# Patient Record
Sex: Female | Born: 1945 | Race: White | Hispanic: No | Smoking: Former smoker
Health system: Southern US, Community
[De-identification: ages and names within clinical notes are randomized; demographics above are authoritative.]

## PROBLEM LIST (undated history)

## (undated) DIAGNOSIS — Z720 Tobacco use: Secondary | ICD-10-CM

## (undated) DIAGNOSIS — F419 Anxiety disorder, unspecified: Secondary | ICD-10-CM

## (undated) DIAGNOSIS — K5792 Diverticulitis of intestine, part unspecified, without perforation or abscess without bleeding: Secondary | ICD-10-CM

## (undated) DIAGNOSIS — I714 Abdominal aortic aneurysm, without rupture, unspecified: Secondary | ICD-10-CM

## (undated) DIAGNOSIS — N135 Crossing vessel and stricture of ureter without hydronephrosis: Secondary | ICD-10-CM

## (undated) DIAGNOSIS — I499 Cardiac arrhythmia, unspecified: Secondary | ICD-10-CM

## (undated) DIAGNOSIS — M1990 Unspecified osteoarthritis, unspecified site: Secondary | ICD-10-CM

## (undated) DIAGNOSIS — K5732 Diverticulitis of large intestine without perforation or abscess without bleeding: Secondary | ICD-10-CM

## (undated) DIAGNOSIS — D1800 Hemangioma unspecified site: Secondary | ICD-10-CM

## (undated) DIAGNOSIS — E785 Hyperlipidemia, unspecified: Secondary | ICD-10-CM

## (undated) DIAGNOSIS — I2510 Atherosclerotic heart disease of native coronary artery without angina pectoris: Secondary | ICD-10-CM

## (undated) DIAGNOSIS — Z972 Presence of dental prosthetic device (complete) (partial): Secondary | ICD-10-CM

## (undated) DIAGNOSIS — I10 Essential (primary) hypertension: Secondary | ICD-10-CM

## (undated) DIAGNOSIS — M21969 Unspecified acquired deformity of unspecified lower leg: Secondary | ICD-10-CM

## (undated) DIAGNOSIS — I4891 Unspecified atrial fibrillation: Secondary | ICD-10-CM

## (undated) DIAGNOSIS — C801 Malignant (primary) neoplasm, unspecified: Secondary | ICD-10-CM

## (undated) DIAGNOSIS — M810 Age-related osteoporosis without current pathological fracture: Secondary | ICD-10-CM

## (undated) HISTORY — DX: Unspecified osteoarthritis, unspecified site: M19.90

## (undated) HISTORY — DX: Hyperlipidemia, unspecified: E78.5

## (undated) HISTORY — PX: OTHER SURGICAL HISTORY: SHX169

## (undated) HISTORY — DX: Atherosclerotic heart disease of native coronary artery without angina pectoris: I25.10

## (undated) HISTORY — PX: KNEE ARTHROSCOPY: SUR90

## (undated) HISTORY — DX: Malignant (primary) neoplasm, unspecified: C80.1

## (undated) HISTORY — PX: TUBAL LIGATION: SHX77

## (undated) HISTORY — DX: Unspecified acquired deformity of unspecified lower leg: M21.969

## (undated) HISTORY — DX: Tobacco use: Z72.0

## (undated) HISTORY — DX: Essential (primary) hypertension: I10

## (undated) HISTORY — DX: Diverticulitis of large intestine without perforation or abscess without bleeding: K57.32

## (undated) HISTORY — DX: Age-related osteoporosis without current pathological fracture: M81.0

## (undated) HISTORY — PX: TONSILLECTOMY AND ADENOIDECTOMY: SUR1326

## (undated) HISTORY — DX: Unspecified atrial fibrillation: I48.91

## (undated) HISTORY — DX: Diverticulitis of intestine, part unspecified, without perforation or abscess without bleeding: K57.92

## (undated) HISTORY — DX: Anxiety disorder, unspecified: F41.9

## (undated) HISTORY — PX: BREAST SURGERY: SHX581

---
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
## 1993-12-11 ENCOUNTER — Encounter: Payer: Self-pay | Admitting: Internal Medicine

## 2002-05-07 ENCOUNTER — Emergency Department (HOSPITAL_COMMUNITY): Admission: EM | Admit: 2002-05-07 | Discharge: 2002-05-07 | Payer: Self-pay | Admitting: Emergency Medicine

## 2005-06-07 ENCOUNTER — Ambulatory Visit: Payer: Self-pay | Admitting: Internal Medicine

## 2005-06-15 ENCOUNTER — Ambulatory Visit: Payer: Self-pay

## 2005-07-05 ENCOUNTER — Ambulatory Visit: Payer: Self-pay | Admitting: Internal Medicine

## 2006-08-20 ENCOUNTER — Ambulatory Visit: Payer: Self-pay | Admitting: Internal Medicine

## 2006-08-24 ENCOUNTER — Emergency Department (HOSPITAL_COMMUNITY): Admission: EM | Admit: 2006-08-24 | Discharge: 2006-08-24 | Payer: Self-pay | Admitting: Emergency Medicine

## 2006-08-27 ENCOUNTER — Ambulatory Visit: Payer: Self-pay | Admitting: Internal Medicine

## 2006-09-16 ENCOUNTER — Ambulatory Visit: Payer: Self-pay | Admitting: Internal Medicine

## 2006-09-17 ENCOUNTER — Encounter: Admission: RE | Admit: 2006-09-17 | Discharge: 2006-09-17 | Payer: Self-pay | Admitting: Internal Medicine

## 2006-09-18 ENCOUNTER — Encounter (HOSPITAL_BASED_OUTPATIENT_CLINIC_OR_DEPARTMENT_OTHER): Admission: RE | Admit: 2006-09-18 | Discharge: 2006-12-17 | Payer: Self-pay | Admitting: Surgery

## 2006-10-07 ENCOUNTER — Ambulatory Visit: Payer: Self-pay | Admitting: Internal Medicine

## 2006-12-17 ENCOUNTER — Ambulatory Visit: Payer: Self-pay | Admitting: Internal Medicine

## 2007-02-15 ENCOUNTER — Emergency Department (HOSPITAL_COMMUNITY): Admission: EM | Admit: 2007-02-15 | Discharge: 2007-02-15 | Payer: Self-pay | Admitting: Emergency Medicine

## 2007-02-20 ENCOUNTER — Ambulatory Visit: Payer: Self-pay | Admitting: Internal Medicine

## 2007-07-22 ENCOUNTER — Encounter: Payer: Self-pay | Admitting: Internal Medicine

## 2007-07-22 DIAGNOSIS — I10 Essential (primary) hypertension: Secondary | ICD-10-CM

## 2007-07-22 DIAGNOSIS — E785 Hyperlipidemia, unspecified: Secondary | ICD-10-CM

## 2008-04-05 ENCOUNTER — Ambulatory Visit: Payer: Self-pay | Admitting: Internal Medicine

## 2008-04-05 LAB — CONVERTED CEMR LAB
ALT: 18 units/L (ref 0–35)
AST: 17 units/L (ref 0–37)
Albumin: 3.6 g/dL (ref 3.5–5.2)
Alkaline Phosphatase: 98 units/L (ref 39–117)
BUN: 15 mg/dL (ref 6–23)
Basophils Absolute: 0 10*3/uL (ref 0.0–0.1)
Basophils Relative: 0 % (ref 0.0–1.0)
Bilirubin Urine: NEGATIVE
Bilirubin, Direct: 0.1 mg/dL (ref 0.0–0.3)
CO2: 26 meq/L (ref 19–32)
Calcium: 9.2 mg/dL (ref 8.4–10.5)
Chloride: 109 meq/L (ref 96–112)
Cholesterol: 206 mg/dL (ref 0–200)
Creatinine, Ser: 0.7 mg/dL (ref 0.4–1.2)
Direct LDL: 158.6 mg/dL
Eosinophils Absolute: 0.1 10*3/uL (ref 0.0–0.7)
Eosinophils Relative: 1.9 % (ref 0.0–5.0)
GFR calc Af Amer: 108 mL/min
GFR calc non Af Amer: 90 mL/min
Glucose, Bld: 142 mg/dL — ABNORMAL HIGH (ref 70–99)
HCT: 42.8 % (ref 36.0–46.0)
HDL: 35.3 mg/dL — ABNORMAL LOW (ref >39.0)
Hemoglobin: 14.5 g/dL (ref 12.0–15.0)
Ketones, ur: NEGATIVE mg/dL
Lymphocytes Relative: 31.5 % (ref 12.0–46.0)
MCHC: 34 g/dL (ref 30.0–36.0)
MCV: 89.9 fL (ref 78.0–100.0)
Monocytes Absolute: 0.3 10*3/uL (ref 0.1–1.0)
Monocytes Relative: 5.8 % (ref 3.0–12.0)
Neutro Abs: 3.7 10*3/uL (ref 1.4–7.7)
Neutrophils Relative %: 60.8 % (ref 43.0–77.0)
Nitrite: POSITIVE — AB
Platelets: 226 10*3/uL (ref 150–400)
Potassium: 4.5 meq/L (ref 3.5–5.1)
RBC: 4.76 M/uL (ref 3.87–5.11)
RDW: 13.4 % (ref 11.5–14.6)
Sodium: 142 meq/L (ref 135–145)
Specific Gravity, Urine: 1.015 (ref 1.000–1.03)
TSH: 2.58 microintl units/mL (ref 0.35–5.50)
Total Bilirubin: 0.6 mg/dL (ref 0.3–1.2)
Total CHOL/HDL Ratio: 5.8
Total Protein, Urine: NEGATIVE mg/dL
Total Protein: 7.1 g/dL (ref 6.0–8.3)
Triglycerides: 73 mg/dL (ref 0–149)
Urine Glucose: NEGATIVE mg/dL
Urobilinogen, UA: 0.2 (ref 0.0–1.0)
VLDL: 15 mg/dL (ref 0–40)
WBC: 6 10*3/uL (ref 4.5–10.5)
pH: 5.5 (ref 5.0–8.0)

## 2008-04-07 ENCOUNTER — Telehealth: Payer: Self-pay | Admitting: Internal Medicine

## 2008-04-09 ENCOUNTER — Ambulatory Visit: Payer: Self-pay | Admitting: Internal Medicine

## 2008-04-09 DIAGNOSIS — F17200 Nicotine dependence, unspecified, uncomplicated: Secondary | ICD-10-CM

## 2008-04-19 ENCOUNTER — Ambulatory Visit: Payer: Self-pay | Admitting: Internal Medicine

## 2008-04-19 LAB — CONVERTED CEMR LAB: OCCULT 4: NEGATIVE

## 2008-04-20 ENCOUNTER — Encounter: Payer: Self-pay | Admitting: Internal Medicine

## 2008-04-21 ENCOUNTER — Ambulatory Visit (HOSPITAL_COMMUNITY): Admission: RE | Admit: 2008-04-21 | Discharge: 2008-04-21 | Payer: Self-pay | Admitting: Internal Medicine

## 2008-06-04 ENCOUNTER — Ambulatory Visit: Payer: Self-pay | Admitting: Internal Medicine

## 2008-06-04 LAB — CONVERTED CEMR LAB
BUN: 12 mg/dL (ref 6–23)
Chloride: 107 meq/L (ref 96–112)
Cholesterol: 176 mg/dL (ref 0–200)
Creatinine, Ser: 0.7 mg/dL (ref 0.4–1.2)
GFR calc non Af Amer: 90 mL/min
LDL Cholesterol: 123 mg/dL — ABNORMAL HIGH (ref 0–99)
Triglycerides: 87 mg/dL (ref 0–149)
VLDL: 17 mg/dL (ref 0–40)

## 2008-06-07 ENCOUNTER — Ambulatory Visit: Payer: Self-pay | Admitting: Internal Medicine

## 2008-06-07 DIAGNOSIS — E118 Type 2 diabetes mellitus with unspecified complications: Secondary | ICD-10-CM | POA: Insufficient documentation

## 2008-09-01 ENCOUNTER — Telehealth: Payer: Self-pay | Admitting: Internal Medicine

## 2008-12-17 ENCOUNTER — Ambulatory Visit (HOSPITAL_BASED_OUTPATIENT_CLINIC_OR_DEPARTMENT_OTHER): Admission: RE | Admit: 2008-12-17 | Discharge: 2008-12-17 | Payer: Self-pay | Admitting: Urology

## 2008-12-17 ENCOUNTER — Encounter (INDEPENDENT_AMBULATORY_CARE_PROVIDER_SITE_OTHER): Payer: Self-pay | Admitting: Urology

## 2009-01-17 ENCOUNTER — Ambulatory Visit (HOSPITAL_COMMUNITY): Admission: RE | Admit: 2009-01-17 | Discharge: 2009-01-17 | Payer: Self-pay | Admitting: Urology

## 2009-12-10 HISTORY — PX: OTHER SURGICAL HISTORY: SHX169

## 2010-12-10 HISTORY — PX: CARDIAC CATHETERIZATION: SHX172

## 2010-12-10 HISTORY — PX: CORONARY STENT PLACEMENT: SHX1402

## 2011-02-13 ENCOUNTER — Emergency Department (HOSPITAL_COMMUNITY): Payer: Medicare Other

## 2011-02-13 ENCOUNTER — Inpatient Hospital Stay (HOSPITAL_COMMUNITY)
Admission: EM | Admit: 2011-02-13 | Discharge: 2011-02-16 | DRG: 247 | Disposition: A | Discharge status: Home / Self Care | Payer: Medicare Other | Attending: Cardiovascular Disease | Admitting: Cardiovascular Disease

## 2011-02-13 DIAGNOSIS — I10 Essential (primary) hypertension: Secondary | ICD-10-CM | POA: Diagnosis present

## 2011-02-13 DIAGNOSIS — F17200 Nicotine dependence, unspecified, uncomplicated: Secondary | ICD-10-CM | POA: Diagnosis present

## 2011-02-13 DIAGNOSIS — F411 Generalized anxiety disorder: Secondary | ICD-10-CM | POA: Diagnosis present

## 2011-02-13 DIAGNOSIS — K5732 Diverticulitis of large intestine without perforation or abscess without bleeding: Secondary | ICD-10-CM | POA: Diagnosis present

## 2011-02-13 DIAGNOSIS — Z7982 Long term (current) use of aspirin: Secondary | ICD-10-CM

## 2011-02-13 DIAGNOSIS — I2510 Atherosclerotic heart disease of native coronary artery without angina pectoris: Secondary | ICD-10-CM | POA: Diagnosis present

## 2011-02-13 DIAGNOSIS — I214 Non-ST elevation (NSTEMI) myocardial infarction: Principal | ICD-10-CM | POA: Diagnosis present

## 2011-02-13 DIAGNOSIS — E785 Hyperlipidemia, unspecified: Secondary | ICD-10-CM | POA: Diagnosis present

## 2011-02-13 LAB — APTT: aPTT: 28 seconds (ref 24–37)

## 2011-02-13 LAB — POCT I-STAT, CHEM 8
BUN: 23 mg/dL (ref 6–23)
Calcium, Ion: 1.09 mmol/L — ABNORMAL LOW (ref 1.12–1.32)
Chloride: 110 meq/L (ref 96–112)
Creatinine, Ser: 0.8 mg/dL (ref 0.4–1.2)
Glucose, Bld: 159 mg/dL — ABNORMAL HIGH (ref 70–99)
HCT: 46 % (ref 36.0–46.0)
Hemoglobin: 15.6 g/dL — ABNORMAL HIGH (ref 12.0–15.0)
Potassium: 4.1 mEq/L (ref 3.5–5.1)
Sodium: 139 meq/L (ref 135–145)
TCO2: 22 mmol/L (ref 0–100)

## 2011-02-13 LAB — CBC
HCT: 44.6 % (ref 36.0–46.0)
Hemoglobin: 15.6 g/dL — ABNORMAL HIGH (ref 12.0–15.0)
MCH: 31 pg (ref 26.0–34.0)
MCHC: 35 g/dL (ref 30.0–36.0)
MCV: 88.7 fL (ref 78.0–100.0)
Platelets: 273 K/uL (ref 150–400)
RBC: 5.03 MIL/uL (ref 3.87–5.11)
RDW: 14.3 % (ref 11.5–15.5)
WBC: 9.4 K/uL (ref 4.0–10.5)

## 2011-02-13 LAB — DIFFERENTIAL
Basophils Absolute: 0 10*3/uL (ref 0.0–0.1)
Basophils Relative: 0 % (ref 0–1)
Eosinophils Absolute: 0.1 K/uL (ref 0.0–0.7)
Eosinophils Relative: 1 % (ref 0–5)
Lymphocytes Relative: 30 % (ref 12–46)
Lymphs Abs: 2.9 10*3/uL (ref 0.7–4.0)
Monocytes Absolute: 0.6 10*3/uL (ref 0.1–1.0)
Monocytes Relative: 6 % (ref 3–12)
Neutro Abs: 5.9 K/uL (ref 1.7–7.7)
Neutrophils Relative %: 62 % (ref 43–77)

## 2011-02-13 LAB — POCT CARDIAC MARKERS
CKMB, poc: 35.4 ng/mL (ref 1.0–8.0)
Myoglobin, poc: 162 ng/mL (ref 12–200)
Troponin i, poc: 1.26 ng/mL (ref 0.00–0.09)

## 2011-02-13 LAB — CK TOTAL AND CKMB (NOT AT ARMC)
CK, MB: 19 ng/mL (ref 0.3–4.0)
Relative Index: 8.1 — ABNORMAL HIGH (ref 0.0–2.5)
Total CK: 234 U/L — ABNORMAL HIGH (ref 7–177)

## 2011-02-13 LAB — PROTIME-INR: INR: 0.95 (ref 0.00–1.49)

## 2011-02-14 DIAGNOSIS — I2510 Atherosclerotic heart disease of native coronary artery without angina pectoris: Secondary | ICD-10-CM

## 2011-02-14 LAB — HEMOGLOBIN A1C
Hgb A1c MFr Bld: 7.7 % — ABNORMAL HIGH (ref <5.7)
Mean Plasma Glucose: 174 mg/dL — ABNORMAL HIGH (ref <117)

## 2011-02-14 LAB — BASIC METABOLIC PANEL WITH GFR
BUN: 17 mg/dL (ref 6–23)
Creatinine, Ser: 0.61 mg/dL (ref 0.4–1.2)
GFR calc Af Amer: >60 mL/min (ref >60)
Glucose, Bld: 139 mg/dL — ABNORMAL HIGH (ref 70–99)
Sodium: 137 meq/L (ref 135–145)

## 2011-02-14 LAB — BASIC METABOLIC PANEL
CO2: 21 mEq/L (ref 19–32)
Calcium: 8.4 mg/dL (ref 8.4–10.5)
Chloride: 105 mEq/L (ref 96–112)
GFR calc non Af Amer: >60 mL/min (ref >60)
Potassium: 3.5 mEq/L (ref 3.5–5.1)

## 2011-02-14 LAB — CARDIAC PANEL(CRET KIN+CKTOT+MB+TROPI)
CK, MB: 10 ng/mL (ref 0.3–4.0)
CK, MB: 6 ng/mL — ABNORMAL HIGH (ref 0.3–4.0)
Relative Index: 6.6 — ABNORMAL HIGH (ref 0.0–2.5)
Relative Index: 4.4 — ABNORMAL HIGH (ref 0.0–2.5)
Total CK: 151 U/L (ref 7–177)
Total CK: 135 U/L (ref 7–177)
Troponin I: 1.66 ng/mL (ref 0.00–0.06)
Troponin I: 1.42 ng/mL (ref 0.00–0.06)

## 2011-02-14 LAB — CBC
HCT: 38.9 % (ref 36.0–46.0)
Hemoglobin: 13.2 g/dL (ref 12.0–15.0)
MCH: 30.2 pg (ref 26.0–34.0)
MCHC: 33.9 g/dL (ref 30.0–36.0)
MCV: 89 fL (ref 78.0–100.0)
Platelets: 226 10*3/uL (ref 150–400)
RBC: 4.37 MIL/uL (ref 3.87–5.11)
RDW: 14.1 % (ref 11.5–15.5)
WBC: 7.9 K/uL (ref 4.0–10.5)

## 2011-02-14 LAB — PROTIME-INR
INR: 1.03 (ref 0.00–1.49)
Prothrombin Time: 13.7 s (ref 11.6–15.2)

## 2011-02-14 LAB — HEPARIN LEVEL (UNFRACTIONATED)
Heparin Unfractionated: 0.13 [IU]/mL — ABNORMAL LOW (ref 0.30–0.70)
Heparin Unfractionated: 0.33 IU/mL (ref 0.30–0.70)

## 2011-02-14 LAB — MRSA PCR SCREENING
MRSA by PCR: NEGATIVE
MRSA by PCR: NEGATIVE

## 2011-02-14 LAB — LIPID PANEL
Cholesterol: 181 mg/dL (ref 0–200)
HDL: 29 mg/dL — ABNORMAL LOW (ref >39)
LDL Cholesterol: 126 mg/dL — ABNORMAL HIGH (ref 0–99)
Total CHOL/HDL Ratio: 6.2 ratio
Triglycerides: 130 mg/dL (ref <150)
VLDL: 26 mg/dL (ref 0–40)

## 2011-02-14 LAB — TSH: TSH: 3.408 u[IU]/mL (ref 0.350–4.500)

## 2011-02-14 LAB — POCT ACTIVATED CLOTTING TIME: Activated Clotting Time: 417 seconds

## 2011-02-15 LAB — CBC
HCT: 36.8 % (ref 36.0–46.0)
Hemoglobin: 12.6 g/dL (ref 12.0–15.0)
MCH: 29.9 pg (ref 26.0–34.0)
MCHC: 34.2 g/dL (ref 30.0–36.0)
MCV: 87.4 fL (ref 78.0–100.0)
Platelets: 204 10*3/uL (ref 150–400)
RBC: 4.21 MIL/uL (ref 3.87–5.11)
RDW: 13.9 % (ref 11.5–15.5)
WBC: 6 K/uL (ref 4.0–10.5)

## 2011-02-15 LAB — GLUCOSE, CAPILLARY
Glucose-Capillary: 124 mg/dL — ABNORMAL HIGH (ref 70–99)
Glucose-Capillary: 126 mg/dL — ABNORMAL HIGH (ref 70–99)

## 2011-02-15 LAB — BASIC METABOLIC PANEL WITH GFR
BUN: 8 mg/dL (ref 6–23)
CO2: 22 meq/L (ref 19–32)
Calcium: 8.6 mg/dL (ref 8.4–10.5)
Glucose, Bld: 113 mg/dL — ABNORMAL HIGH (ref 70–99)
Sodium: 138 meq/L (ref 135–145)

## 2011-02-15 LAB — BASIC METABOLIC PANEL
Chloride: 108 mEq/L (ref 96–112)
Creatinine, Ser: 0.59 mg/dL (ref 0.4–1.2)
GFR calc Af Amer: >60 mL/min (ref >60)
GFR calc non Af Amer: >60 mL/min (ref >60)
Potassium: 3.4 mEq/L — ABNORMAL LOW (ref 3.5–5.1)

## 2011-02-15 LAB — HEPARIN LEVEL (UNFRACTIONATED): Heparin Unfractionated: <0.1 IU/mL — ABNORMAL LOW (ref 0.30–0.70)

## 2011-02-16 DIAGNOSIS — I214 Non-ST elevation (NSTEMI) myocardial infarction: Secondary | ICD-10-CM

## 2011-02-16 LAB — GLUCOSE, CAPILLARY: Glucose-Capillary: 142 mg/dL — ABNORMAL HIGH (ref 70–99)

## 2011-02-16 NOTE — H&P (Signed)
NAME:  Melody Casey, Melody Casey                 ACCOUNT NO.:  [id number]  MEDICAL RECORD NO.:  [id number]           PATIENT TYPE:  I  LOCATION:  3315                         FACILITY:  [facility]  PHYSICIAN:  Armanda Magic, M.D.     DATE OF BIRTH:  1946/01/26  DATE OF ADMISSION:  02/13/2011 DATE OF DISCHARGE:                             HISTORY & PHYSICAL   REFERRING PHYSICIAN:  Dr. Herbert Moors in the ER.  CHIEF COMPLAINT:  Chest pain.  HISTORY OF PRESENT ILLNESS:  This is a 65 year old white female with a history of dyslipidemia, but no cardiac problems in the past also with a history of hypertension who was in her usual state of health until last evening around 7:00 p.m. when she developed palpitations and flutter with anxiety attack.  She said she felt pressure of chest lasting approximately 2 hours and took 3 baby aspirin and went to bed.  This morning she woke up feeling fine and went to her primary doctor because she had an appointment to get her blood pressure medicine refilled and she mentioned her symptoms to her.  An EKG was done which is markedly abnormal and she was sent to the emergency room.  Since last evening she has not had any further chest pressure.  She denied any shortness of breath, nausea, vomiting, diaphoresis and is currently pain free.  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  In January 2010 status post cystoscopy with bilateral retrograde pyelography, right ureteroscopy, right ureteral stent placement, and bladder biopsies.  She subsequently had the stent removed and has done well since then without any further hematuria.  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  FAMILY HISTORY:  Father died of lung CA at 53 and mother  died of CHF at 52.  ALLERGIES:  PREDNISONE, PERCOCET, SULFA, PENICILLIN, TYLENOL.  SOCIAL HISTORY:  She is married.  Has 2 children.  She denies any alcohol use.  She smokes 1 pack of cigarettes daily for the past 50 years.  MEDICATIONS:  Lisinopril HCT 20/12.5 mg daily, aspirin 81 mg 3 tablets daily.  REVIEW OF SYSTEMS:  Negative except for what is stated in HPI.  PHYSICAL EXAMINATION:  VITAL SIGNS:  Blood pressure is 127/89, heart rate 70. GENERAL:  This is a well-developed, well-nourished female in no acute distress. HEENT:  Benign. NECK:  Supple without lymphadenopathy.  Carotid upstrokes +2 bilaterally, no bruits. LUNGS:  Clear to auscultation throughout. HEART:  Regular rate and rhythm.  No murmurs, rubs, or gallops. ABDOMEN:  Soft, nontender, nontender with active bowel sounds.  No hepatosplenomegaly. EXTREMITIES:  No cyanosis, erythema, or edema.  Her right leg is significant smaller than the other one lapped with a deformity of her right foot.  There are trace pulses bilaterally.  LABORATORY DATA:  Sodium 139, potassium 4.1, chloride 110, bicarb 22, BUN 23, creatinine 0.8.  White cell count 9.4, hemoglobin 15.6, hematocrit 44.6, platelet count 273,000.  Point-of-care markers,  CPK-MB 35.4, troponin 1.26, INR 0.95.  Chest x-ray is pending.  EKG shows normal sinus rhythm with 1 mm of horizontal ST depression in V5-V6. 1. Status post non-ST elevation myocardial infarction, currently pain     free. 2. Hypertension. 3. Dyslipidemia. 4. Anxiety.  PLAN:  Admit to step-down unit.  Cycle cardiac enzymes, IV heparin and nitro drip, aspirin n.p.o. after midnight.  Check hemoglobin A1c and a fasting lipid panel in the morning, Lopressor 25 mg b.i.d., Lipitor 40 mg a daily.  We will plan cardiac catheterization in the morning per [facility] cardiology to further delineate coronary anatomy.     Armanda Magic, M.D.     TT/MEDQ  D:  02/13/2011  T:  02/14/2011  Job:   [id number]  cc:   [facility]  Electronically Signed by Armanda Magic M.D. on 02/15/2011 04:20:21 PM

## 2011-02-19 LAB — GLUCOSE, CAPILLARY: Glucose-Capillary: 119 mg/dL — ABNORMAL HIGH (ref 70–99)

## 2011-02-19 NOTE — Discharge Summary (Addendum)
NAME:  Melody Casey, Melody Casey NO.:  [id number]  MEDICAL RECORD NO.:  [id number]           PATIENT TYPE:  I  LOCATION:  2503                         FACILITY:  [facility]  PHYSICIAN:  Noralyn Pick. Eden Emms, MD, FACCDATE OF BIRTH:  1946/11/18  DATE OF ADMISSION:  02/13/2011 DATE OF DISCHARGE:  02/16/2011                              DISCHARGE SUMMARY   DISCHARGE DIAGNOSES: 1. Chest pain with non-ST segment elevation myocardial infarction with     peak troponin of 3.27. 2. Newly diagnosed coronary artery disease by cath February 14, 2011.     a.     Occluded mid circumflex felt to be the culprit vessel, still      had left anterior descending and right coronary artery, to be      treated medically.     b.     Status post successful percutaneous coronary      intervention/drug-eluting stent placement to obtuse marginal 2.     c.     Moderate disease in the left anterior descending, treated      medically.     d.     Normal left ventricular function by cath February 14, 2011. 3. Newly diagnosed diabetes mellitus with an A1c is 7.7. 4. Hypertension. 5. Dyslipidemia, total cholesterol 181, triglycerides 130, HDL 29, and     LDL 126, we will need repeat LFTs and lipids in 4-6 weeks. 6. Anxiety. 7. Diverticulitis. 8. History of ureteral blockage with history of gross hematuria and     right hydronephrosis secondary to uteropelvic junction obstruction,     status post cystoscopy in January 2007 with bilateral retrograde     pyelography, right ureter arthroscopy, right ureteral stent     placement, bladder biopsies, stent removal since then. 9. Tobacco abuse. 10.Hypertension. 11.Left knee surgery. 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  HOSPITAL COURSE:  Melody Casey is a 65 year old female with past medical history that includes hypertension, dyslipidemia, ongoing tobacco abuse, but no previously known coronary artery disease who presented to [facility] with  complaints of palpitations, fluttering chest discomfort.  EKG was done which showed horizontal ST depression at V5- V6.  Cardiac enzymes were performed which ruled her in for myocardial infarction with peak troponin of 3.27.  She was subsequently admitted to the Cardiology service.  She was also found to be a new diabetic and was set up for outpatient diabetic education.  She was initiated with sliding scale insulin with plans for p.o. medications at discharge.  The patient underwent cardiac catheterization on February 14, 2011, by Dr. Greeley Sink who found her to have the above findings within the circumflex as well as moderate disease in the LAD.  She tolerated angioplasty and drug- eluting stent placement to the proximal OM2 well.  She was seen by cardiac rehab and tobacco cessation counseling for risk factor modification education.  Dr. Eden Emms has seen and examined the patient today and feels she is stable for discharge.  He would also like her to be initiated on metformin at discharge with close outpatient followup, which she will be instructed to  do.  DISCHARGE LABS:  WBC 6, hemoglobin 12.6, hematocrit 36.8, and platelet count 204.  Sodium 138, potassium 3.4, chloride 108, CO2 22, glucose 130, BUN 8, creatinine 0.59, hemoglobin A1c is 7.7.  Cholesterol panel as above done on February 14, 2011, TSH 3.408.  STUDIES: 1. Chest x-ray February 13, 2011, showed mild bronchitic changes and     interstitial prominence of bibasilar atelectasis. 2. Cardiac catheterization February 14, 2011, please see full report for     details as well as HPI for summary.  DISCHARGE MEDICATIONS: 1. Lipitor 40 mg at bedtime. 2. Metformin 500 mg t.i.d. 3. Metoprolol tartrate 25 mg b.i.d. 4. Nitroglycerin sublingual 0.4 mg every 5 minutes as needed. 5. Effient 10 mg daily. 6. Aspirin 81 mg daily. 7. Fish oil one capsule daily. 8. Ibuprofen 200 mg 2-4 tablets every morning as needed with     instructions only take this  medicine rarely as needed as this     medicine can increase the risk of stomach bleeding while taking     medicines like aspirin, Effient, lisinopril/HCTZ 20/12.5 mg daily.  The patient was also given a prescription for glucometer, diabetic Lantus and test strips.  DISPOSITION:  [name] will be discharged in stable condition to home. She is not to lift anything or participate in sexual activity for 5 days.  She is not to drive for 1 day.  She is to follow a heart-healthy, low-sodium diabetic diet, and to call or return if she notices any pain, swelling, bleeding, or pus at the cath site.  She is instructed to follow up with her primary care provider early next week to discuss diabetic management with her new diagnosis of diabetes.  She prefers to follow up in our [facility] as she lives in [place], will follow with Dr. Antoine Poche for her first post hospital visit March 14, 2011, at 11 a.m.  Case management has also helped assist Melody Casey with helping to make Effient more affordable for her as well.  DURATION OF DISCHARGE ENCOUNTER:  Greater than 30 minutes including physician and PA time.     Dayna Dunn, P.A.C.  ______________________________ Noralyn Pick Eden Emms, MD, [facility]    DD/MEDQ  D:  02/16/2011  T:  02/17/2011  Job:  [id number]  Electronically Signed by Charlton Haws MD [facility] on 03/07/2011 11:10:12 PM Electronically Signed by Ronie Spies  on 03/08/2011 12:18:14 PM

## 2011-03-03 ENCOUNTER — Encounter: Payer: Self-pay | Admitting: Cardiology

## 2011-03-06 ENCOUNTER — Telehealth: Payer: Self-pay | Admitting: Cardiology

## 2011-03-06 NOTE — Telephone Encounter (Signed)
LMOM for call back. 

## 2011-03-12 NOTE — Telephone Encounter (Signed)
Will forward to [facility].  Need [facility] referral form for cardiac rehab there.

## 2011-03-14 ENCOUNTER — Encounter: Payer: Self-pay | Admitting: Cardiology

## 2011-03-14 ENCOUNTER — Ambulatory Visit (INDEPENDENT_AMBULATORY_CARE_PROVIDER_SITE_OTHER): Payer: Medicare Other | Admitting: Cardiology

## 2011-03-14 VITALS — BP 118/72 | HR 59

## 2011-03-14 DIAGNOSIS — Z9861 Coronary angioplasty status: Secondary | ICD-10-CM | POA: Insufficient documentation

## 2011-03-14 DIAGNOSIS — E785 Hyperlipidemia, unspecified: Secondary | ICD-10-CM

## 2011-03-14 DIAGNOSIS — Z720 Tobacco use: Secondary | ICD-10-CM

## 2011-03-14 DIAGNOSIS — I10 Essential (primary) hypertension: Secondary | ICD-10-CM

## 2011-03-14 DIAGNOSIS — R7309 Other abnormal glucose: Secondary | ICD-10-CM

## 2011-03-14 DIAGNOSIS — F17200 Nicotine dependence, unspecified, uncomplicated: Secondary | ICD-10-CM

## 2011-03-14 DIAGNOSIS — I2510 Atherosclerotic heart disease of native coronary artery without angina pectoris: Secondary | ICD-10-CM

## 2011-03-14 NOTE — Assessment & Plan Note (Signed)
I am proud of her for not smoking.  She will continue that.

## 2011-03-14 NOTE — Assessment & Plan Note (Signed)
I will defer to her primary care team

## 2011-03-14 NOTE — Procedures (Signed)
NAME:  Melody Casey, Melody Casey                 ACCOUNT NO.:  [id number]  MEDICAL RECORD NO.:  [id number]           PATIENT TYPE:  I  LOCATION:  2807                         FACILITY:  [facility]  PHYSICIAN:  Lorine Bears, MD     DATE OF BIRTH:  Mar 09, 1946  DATE OF PROCEDURE: DATE OF DISCHARGE:                           CARDIAC CATHETERIZATION   PROCEDURES PERFORMED: 1. Left heart catheterization. 2. Coronary angiography. 3. Left ventricular angiography. 4. Angioplasty and drug-eluting stent placement to proximal OM-2.  INDICATIONS AND CLINICAL HISTORY:  Melody Casey is a 65 year old female with no previous cardiac history.  She has history of hypertension, hyperlipidemia, and tobacco use.  She had an episode of chest pain on Monday which lasted for 2 hours.  She went to see her primary care physician where she was found to have an abnormal EKG, and thus she was sent to be admitted.  She was found to have mildly elevated troponin at 3.0.  She has been chest pain free.  Her electrocardiogram showed 1-mm ST depression in V5 and V6.  Due to that, cardiac catheterization was recommended.  Risks, benefits, and alternatives were discussed with the patient.  ACCESS:  Right radial artery.  STUDY DETAILS:  A standard informed consent was obtained.  The right radial area was prepped in a sterile fashion.  It was anesthetized with 1% lidocaine.  A 5-French sheath was placed in the radial artery after an anterior puncture.  Verapamil 3 mg was given through the sheath. Unfractionated heparin, 4000 units was given intravenously.  Coronary angiography was performed with a TIG catheter as well as JR-4 catheter. Left ventricular angiography was performed with a pigtail catheter.  All catheter exchanges were done over the wire.  INTERVENTIONAL PROCEDURE DETAILS:  The 5-French sheath was exchanged for a 6-French sheath.  IV Bivalirudin was initiated with therapeutic ACT. An XP 3.0 guiding catheter was used.   The lesion was wired with an intuition wire.  It was predilated with a 2.5 x 15-mm balloon to 8 atmospheres and 10 atmospheres.  I then placed a 2.75 x 26 mm Resolute Integrity stent and deployed it at 10 atmospheres.  I tried to post dilate with a 3.0 noncompliant balloon, but that could not be delivered due to tortuosity in the vessel.  I used a Research scientist (physical sciences) as a buddy wire, but still could not advance the noncompliant balloon.  Thus, I elected to use a 2.75 Compliant balloon which crossed.  I dilated with this to 14 atmospheres and then 16 atmospheres proximally.  Angiography showed very good results with 0% residual stenosis and TIMI 3 flow.  STUDY FINDINGS:  Hemodynamic findings:  Left ventricular pressure was 108/11 with left ventricular end-diastolic pressure of 16 mmHg.  Central aortic pressure was 119/56 with a mean pressure of 73 mmHg.  Left ventricular angiography:  This showed an ejection fraction of 55% with mild basal inferior wall hypokinesis.  Coronary angiography: Left main coronary artery:  The vessel was normal in size with a 20% proximal stenosis. Left circumflex artery:  The vessel was overall large size and codominant.  The vessel is occluded  in the mid segment right after giving OM-2.  The distal left circumflex fills with faint collaterals coming from the LAD and RCA.  It appears that the vessel is diffusely diseased distally.  OM-1 is a small-sized branch.  OM-2 is a relatively large-sized vessel with two lesions proximally, each 80%. Left anterior descending artery:  The vessel was normal in size with mild calcifications.  In the proximal area, before giving a large diagonal, there is 40% tubular stenosis.  First diagonal is a very small- sized branch.  Second diagonal is a large size branch that gives two medium-size vessels.  It has 30% ostial stenosis.  In the LAD, there is another 30% discrete lesion in the mid segment.  The rest of the LAD does not  have any significant disease. Right coronary artery:  The vessel was overall small in size and codominant.  It ends in a small PDA.  Overall, it has mild atherosclerosis without obstructive disease.  There are faint collaterals from the distal RCA to the left circumflex.  CONCLUSIONS: 1. Severe single-vessel coronary artery disease. 2. The mid circumflex is occluded and is likely the culprit for recent     myocardial infarction.  Her symptoms started on Monday, and that     probably was the onset.  The vessel appears to be diffusely     diseased distally and fills by collaterals from the LAD and the     RCA.  Thus, the benefit of angioplasty on this vessel is marginal     at best.  I elected not to intervene on this vessel. 3. Successful angioplasty and drug-eluting stent placement to the     proximal OM-2 with a placement of 2.75 x 26 mm Resolute drug-     eluting stent which was postdilated in the proximal segment to 3.0. 4. Normal LV systolic function. 5. Moderate disease in the left anterior descending artery.  RECOMMENDATIONS: 1. Recommend aspirin daily indefinitely as well as Efient 10 mg once     daily for at least 12 months. 2. Tobacco cessation. 3. Aggressive treatment of risk factors.     Lorine Bears, MD     MA/MEDQ  D:  02/14/2011  T:  02/15/2011  Job:  [id number]  Electronically Signed by Lorine Bears MD on 03/14/2011 02:53:00 PM

## 2011-03-14 NOTE — Assessment & Plan Note (Signed)
We discussed in great length secondary risk reduction. For now she will continue the meds as listed. I have made a referral to cardiac rehabilitation at [facility].

## 2011-03-14 NOTE — Progress Notes (Signed)
HPI The patient presents for followup after a non-Q-wave myocardial infarction. She had drug-eluting stent placement to an obtuse marginal, and occluded circumflex that was otherwise not treated and nonobstructive disease elsewhere with a preserved ejection fraction. She was diagnosed with diabetes.  Since that time she has had no new cardiovascular complaints. She has stopped smoking. She is taking the meds as listed though she reduced the dose of beta blocker secondary to bradycardia. She is trying to watch her diet. She has had no chest discomfort there was her previous angina. He has had no palpitations, presyncope or syncope. She's had no shortness of breath, PND or orthopnea. She has been emotional and tearful and has had some anxiety which is being treated.  Allergies  Allergen Reactions  . Acetaminophen   . Oxycodone-Acetaminophen   . Penicillins   . Prednisone   . Sulfonamide Derivatives     Current Outpatient Prescriptions  Medication Sig Dispense Refill  . ALPRAZolam (XANAX) 0.25 MG tablet Take 0.25 mg by mouth at bedtime as needed.        Marland Kitchen aspirin 81 MG tablet Take 243 mg by mouth daily.        Marland Kitchen atorvastatin (LIPITOR) 40 MG tablet Take 40 mg by mouth daily.        . fish oil-omega-3 fatty acids 1000 MG capsule Take 1 g by mouth daily.        Marland Kitchen ibuprofen (ADVIL,MOTRIN) 200 MG tablet Take 200 mg by mouth every 6 (six) hours as needed.        Marland Kitchen lisinopril-hydrochlorothiazide (PRINZIDE,ZESTORETIC) 20-12.5 MG per tablet Take 1 tablet by mouth daily.        . metFORMIN (GLUMETZA) 500 MG (MOD) 24 hr tablet Take 500 mg by mouth daily with breakfast.        . metoprolol succinate (TOPROL-XL) 25 MG 24 hr tablet Take 25 mg by mouth. 1/2 tab qd        . niacin (NIASPAN) 500 MG CR tablet Take 500 mg by mouth at bedtime.        . nitroGLYCERIN (NITROSTAT) 0.4 MG SL tablet Place 0.4 mg under the tongue every 5 (five) minutes as needed.        . prasugrel (EFFIENT) 10 MG TABS Take 10 mg by  mouth. 1 tab daily       . amLODipine (NORVASC) 2.5 MG tablet Take 2.5 mg by mouth daily.        . nicotine (NICOTROL) 10 MG inhaler as directed.        . olmesartan-hydrochlorothiazide (BENICAR HCT) 40-12.5 MG per tablet Take 1 tablet by mouth daily.        . simvastatin (ZOCOR) 40 MG tablet Take 40 mg by mouth at bedtime.          Past Medical History  Diagnosis Date  . HLD (hyperlipidemia)   . HTN (hypertension)   . Arthritis   . Foot deformity     right  . CAD (coronary artery disease)     Circumflex occlusion February 14, 1999 medical treatment,  DES to OM2, LAD  40%, D1 30%  . Tobacco abuse   . Diabetes mellitus     Past Surgical History  Procedure Date  . Tonsillectomy and adenoidectomy   . Knee arthroscopy     left  . Tubal ligation   . 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 Ureteral surgery    ROS As stated in  the HPI and negative for all other systems, except for depression  PHYSICAL EXAM BP 118/72  Pulse 59 GENERAL:  Well appearing HEENT:  Pupils equal round and reactive, fundi not visualized, oral mucosa unremarkable NECK:  No jugular venous distention, waveform within normal limits, carotid upstroke brisk and symmetric, no bruits, no thyromegaly LYMPHATICS:  No cervical, inguinal adenopathy LUNGS:  Clear to auscultation bilaterally BACK:  No CVA tenderness CHEST:  Unremarkable HEART:  PMI not displaced or sustained,S1 and S2 within normal limits, no S3, no S4, no clicks, no rubs, no murmurs ABD:  Flat, positive bowel sounds normal in frequency in pitch, no bruits, no rebound, no guarding, no midline pulsatile mass, no hepatomegaly, no splenomegaly EXT:  2 plus pulses throughout, no edema, no cyanosis no , 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

## 2011-03-14 NOTE — Assessment & Plan Note (Signed)
The blood pressure is at target. No change in medications is indicated. We will continue with therapeutic lifestyle changes (TLC).  

## 2011-03-14 NOTE — Assessment & Plan Note (Signed)
She should have a lipid profile drawn by her primary provider office in approximately 6-8 weeks with a goal LDL less than 70 and HDL greater than 50. For now she will continue meds as listed.

## 2011-03-14 NOTE — Patient Instructions (Signed)
You will be contacted by cardiac rehab at [facility] Follow up with Dr Antoine Poche in [place]

## 2011-03-16 ENCOUNTER — Other Ambulatory Visit: Payer: Self-pay | Admitting: Internal Medicine

## 2011-03-23 ENCOUNTER — Inpatient Hospital Stay (HOSPITAL_COMMUNITY)
Admission: EM | Admit: 2011-03-23 | Discharge: 2011-03-25 | DRG: 310 | Disposition: A | Discharge status: Home / Self Care | Payer: Medicare Other | Attending: Cardiology | Admitting: Cardiology

## 2011-03-23 ENCOUNTER — Emergency Department (HOSPITAL_COMMUNITY): Payer: Medicare Other

## 2011-03-23 DIAGNOSIS — Z87891 Personal history of nicotine dependence: Secondary | ICD-10-CM

## 2011-03-23 DIAGNOSIS — E119 Type 2 diabetes mellitus without complications: Secondary | ICD-10-CM | POA: Diagnosis present

## 2011-03-23 DIAGNOSIS — I4891 Unspecified atrial fibrillation: Secondary | ICD-10-CM

## 2011-03-23 DIAGNOSIS — Z7982 Long term (current) use of aspirin: Secondary | ICD-10-CM

## 2011-03-23 DIAGNOSIS — E785 Hyperlipidemia, unspecified: Secondary | ICD-10-CM | POA: Diagnosis present

## 2011-03-23 DIAGNOSIS — I252 Old myocardial infarction: Secondary | ICD-10-CM

## 2011-03-23 DIAGNOSIS — I2510 Atherosclerotic heart disease of native coronary artery without angina pectoris: Secondary | ICD-10-CM | POA: Diagnosis present

## 2011-03-23 DIAGNOSIS — I10 Essential (primary) hypertension: Secondary | ICD-10-CM | POA: Diagnosis present

## 2011-03-23 LAB — PROTIME-INR: Prothrombin Time: 12.8 seconds (ref 11.6–15.2)

## 2011-03-23 LAB — CBC
HCT: 40.7 % (ref 36.0–46.0)
MCHC: 36.1 g/dL — ABNORMAL HIGH (ref 30.0–36.0)
MCV: 87 fL (ref 78.0–100.0)
Platelets: 243 10*3/uL (ref 150–400)
RDW: 13.6 % (ref 11.5–15.5)
WBC: 8.6 10*3/uL (ref 4.0–10.5)

## 2011-03-23 LAB — POCT CARDIAC MARKERS: CKMB, poc: <1 ng/mL — ABNORMAL LOW (ref 1.0–8.0)

## 2011-03-23 LAB — DIFFERENTIAL
Eosinophils Absolute: 0.1 10*3/uL (ref 0.0–0.7)
Eosinophils Relative: 1 % (ref 0–5)
Lymphs Abs: 1.8 10*3/uL (ref 0.7–4.0)
Monocytes Absolute: 0.5 10*3/uL (ref 0.1–1.0)

## 2011-03-23 LAB — BASIC METABOLIC PANEL
BUN: 11 mg/dL (ref 6–23)
CO2: 22 mEq/L (ref 19–32)
Calcium: 9.6 mg/dL (ref 8.4–10.5)
Creatinine, Ser: 0.62 mg/dL (ref 0.4–1.2)
Glucose, Bld: 137 mg/dL — ABNORMAL HIGH (ref 70–99)

## 2011-03-23 LAB — CK TOTAL AND CKMB (NOT AT ARMC)
CK, MB: 2.7 ng/mL (ref 0.3–4.0)
Total CK: 77 U/L (ref 7–177)

## 2011-03-23 LAB — GLUCOSE, CAPILLARY: Glucose-Capillary: 113 mg/dL — ABNORMAL HIGH (ref 70–99)

## 2011-03-24 LAB — URINE MICROSCOPIC-ADD ON

## 2011-03-24 LAB — URINALYSIS, ROUTINE W REFLEX MICROSCOPIC
Glucose, UA: NEGATIVE mg/dL
Hgb urine dipstick: NEGATIVE
Specific Gravity, Urine: 1.013 (ref 1.005–1.030)
pH: 6.5 (ref 5.0–8.0)

## 2011-03-24 LAB — COMPREHENSIVE METABOLIC PANEL
AST: 19 U/L (ref 0–37)
Albumin: 3.4 g/dL — ABNORMAL LOW (ref 3.5–5.2)
Calcium: 9.2 mg/dL (ref 8.4–10.5)
Chloride: 105 mEq/L (ref 96–112)
Creatinine, Ser: 0.63 mg/dL (ref 0.4–1.2)
GFR calc Af Amer: >60 mL/min (ref >60)
Total Protein: 6.4 g/dL (ref 6.0–8.3)

## 2011-03-24 LAB — CBC
MCH: 30.7 pg (ref 26.0–34.0)
MCV: 87.3 fL (ref 78.0–100.0)
Platelets: 211 10*3/uL (ref 150–400)
RBC: 4.1 MIL/uL (ref 3.87–5.11)

## 2011-03-24 LAB — GLUCOSE, CAPILLARY

## 2011-03-24 LAB — CARDIAC PANEL(CRET KIN+CKTOT+MB+TROPI)
CK, MB: 3.1 ng/mL (ref 0.3–4.0)
Relative Index: INVALID (ref 0.0–2.5)
Troponin I: 0.06 ng/mL (ref 0.00–0.06)

## 2011-03-25 LAB — CBC
HCT: 35.7 % — ABNORMAL LOW (ref 36.0–46.0)
Hemoglobin: 12.2 g/dL (ref 12.0–15.0)
RDW: 13.6 % (ref 11.5–15.5)
WBC: 5.1 10*3/uL (ref 4.0–10.5)

## 2011-03-25 LAB — PROTIME-INR
INR: 0.99 (ref 0.00–1.49)
Prothrombin Time: 13.3 seconds (ref 11.6–15.2)

## 2011-03-25 LAB — GLUCOSE, CAPILLARY
Glucose-Capillary: 128 mg/dL — ABNORMAL HIGH (ref 70–99)
Glucose-Capillary: 129 mg/dL — ABNORMAL HIGH (ref 70–99)

## 2011-03-25 LAB — HEPARIN LEVEL (UNFRACTIONATED): Heparin Unfractionated: <0.1 IU/mL — ABNORMAL LOW (ref 0.30–0.70)

## 2011-03-26 LAB — POCT I-STAT 4, (NA,K, GLUC, HGB,HCT)
Glucose, Bld: 156 mg/dL — ABNORMAL HIGH (ref 70–99)
HCT: 45 % (ref 36.0–46.0)
Hemoglobin: 15.3 g/dL — ABNORMAL HIGH (ref 12.0–15.0)

## 2011-03-28 NOTE — H&P (Addendum)
NAME:  Melody Casey, Melody Casey NO.:  [id number]  MEDICAL RECORD NO.:  [id number]           PATIENT TYPE:  I  LOCATION:  2010                         FACILITY:  [facility]  PHYSICIAN:  Jesse Sans. Wall, MD, FACCDATE OF BIRTH:  [birth year]  DATE OF ADMISSION:  03/23/2011 DATE OF DISCHARGE:                             HISTORY & PHYSICAL   PRIMARY CARDIOLOGIST:  Rollene Rotunda, MD, [facility]  CHIEF COMPLAINT:  Palpitations, fluttering.  REASON FOR ADMISSION:  New-onset atrial fibrillation.  HISTORY OF PRESENT ILLNESS:  Melody Casey is a 65 year old female with a history of newly diagnosed coronary artery disease as well as newly- diagnosed diabetes last admission in March 2012, dyslipidemia, and hypertension who has been doing fairly well since her discharge from the hospital. She did experience some bradycardia with heart rate in the 40s and therefore her beta-blocker had to be cut down.  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  However, at 3:45 p.m. while watching TV she felt the onset of fluttering sensation in her throat, and took her blood pressure, which was 185/94 with a heart rate of 104.  She called the  primary care provider's office concerned that she was having another MI and was told to call 911.  She took 4 baby aspirin as well.  She had no chest pain or shortness of breath with the fluttering sensation.  With her prior MI in March, she did have a sensation of chest heaviness, so this was different.  Now she did feel somewhat nervous with the palpitations. She got IV Cardizem 10 mg x1 in the ER, but her heart rate still in the 100s-130s.  Lab work is mostly unremarkable thus far with negative cardiac enzymes x1.  PAST MEDICAL HISTORY: 1. Coronary artery disease diagnosed by cath on February 14, 2011, in the     setting of an NSTEMI.     a.     NSTEMI was secondary to occluded mid circumflex, status post      PTCA/drug  eluting stent placement to OM2 with residual LAD and RCA      disease.     b.     Normal LV function by cath February 14, 2011. 2. Type 2 diabetes. 3. Hypertension. 4. Dyslipidemia. 5. Anxiety. 6. Diverticulitis. 7. Tobacco abuse, quit 1 month ago after 50 years of smoking. 8. Bradycardia, recently decreased with metoprolol.  PAST SURGICAL HISTORY:  Left knee surgery, right knee surgery, and urologic procedures remotely.  MEDICATIONS: 1. Lipitor 40 mg at bedtime. 2. Metformin 500 mg b.i.d. 3. Lopressor 25 mg half tablet b.i.d. 4. Sublingual nitroglycerin p.r.n. 5. Effient 10 mg daily. 6. Aspirin 81 mg daily. 7. Fish oil 1 capsule daily. 8. Lisinopril/hydrochlorothiazide 20/12.5 mg daily. 9. Ibuprofen 200 mg 2-4 tablets daily p.r.n. 10.Niacin 500 mg daily.  ALLERGIES:  TYLENOL, PERCOCET, PENICILLIN, PREDNISONE, and SULFA.  SOCIAL HISTORY:  Ms. Brickey is married.  She does help to take care of her husband who has laryngeal cancer.  She has 2 children.  She smokes for 50 years and made  decision to quit one month ago and has done well at present.  She denies any alcohol use.  FAMILY HISTORY:  Mother died of CHF at age 25 and father died of lung cancer at age 62.  REVIEW OF SYSTEMS:  Mostly unremarkable except for palpitations.  All other systems reviewed and otherwise negative.  LABORATORY DATA:  WBC 8.6, hemoglobin 14.7, hematocrit 40.7, platelet count 243.  Sodium 139, potassium 3.9, chloride 104, CO2 22, glucose 137, BUN 11 creatinine 0.62.  Cardiac enzymes negative x1.  INR is 0.94. EKG:  AFib, RVR, rate of 110 beats per minute, no acute changes.  RADIOLOGY:  Chest x-ray showed no acute cardiopulmonary disease.  PHYSICAL EXAMINATION:  VITAL SIGNS:  Temperature 97.7; pulse 77, initially 134.  Upon interview, her heart rate seems to jump from 100- 130, respirations 20, blood pressure 111/73, pulse ox 97% at 2 L. GENERAL:  This is a pleasant white female, in no acute  distress. HEENT:  Normocephalic, atraumatic.  Extraocular movements are intact. Clear sclerae.  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 NECK:  Supple without carotid bruit. HEART:  Auscultation of the heart shows a regular tachycardic rhythm with S1, S2 without obvious murmurs, rubs, or gallops. LUNGS:  Sounds are clear to auscultation bilaterally without wheezes, rales, or rhonchi. ABDOMEN:  Soft, nontender, nondistended.  Positive bowel sounds. EXTREMITIES:  Warm, dry, without edema.  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 NEUROLOGIC:  She is alert and oriented x3, responds to questions appropriately with a normal affect.  ASSESSMENT/PLAN:  The patient was seen and examined by Dr. Daleen Squibb and myself.  This is a very pleasant 65 year old lady with a history of recently diagnosed coronary artery disease, recently diagnosed diabetes, hypertension, dyslipidemia, bradycardia who presents to [facility] with chest fluttering and tachycardia consistent with new-onset atrial fibrillation with rapid ventricular response.  Asides from the fluttering, she is relatively asymptomatic with no chest pain or shortness of breath.  She is still going fast; however, and/or prior additional rate control with careful attention to heart rate given her history of bradycardia recently.  Her Lopressor will be discontinued given her normal EF and will change her to short-acting diltiazem 30 mg p.o. q.6 hours for heart rate control.  We will heparinize her, long term given her elevated CHADS-VASc score, she will need to be on Coumadin.  This may need eventfully that  further anticoagulation medicines may need to be stopped given the risk of bleeding.  In the short term, she will be continued on her aspirin and Effient as well. She will be admitted to hospital, we will rule out MI.  The plan was discussed with the patient who agrees to  proceed.     Xzaiver Vayda, P.A.C.   ______________________________ Jesse Sans Daleen Squibb, MD, [facility]    DD/MEDQ  D:  03/23/2011  T:  03/24/2011  Job:  [id number]  cc:   Rollene Rotunda, MD, [facility]  Electronically Signed by Valera Castle MD [facility] on 03/28/2011 08:42:58 AM Electronically Signed by Ronie Spies  on 03/28/2011 11:25:15 AM

## 2011-03-29 ENCOUNTER — Telehealth: Payer: Self-pay | Admitting: Cardiology

## 2011-03-29 NOTE — Telephone Encounter (Signed)
Spoke with pt who states her BP has been elevated and this am it was 200/96 HR 97, she took her am meds and re-took her BP 1 hour later and it was 169/83 HR 71.  Yesterday BP was 156/78 and then 1 hour later "she felt it coming on" it was 194/104  HR 81 despite taking her medications.  She reports that her toprol had been stopped during her last hospital visit be cause her hear trate was too slow.  Pt has an appointment tomorrow with her primary care MD and a follow up appt with Dr Antoine Poche next week.  I encouraged her to keep both appts.

## 2011-03-29 NOTE — Discharge Summary (Signed)
NAME:  Melody Casey, Melody Casey NO.:  [id number]  MEDICAL RECORD NO.:  [id number]           PATIENT TYPE:  I  LOCATION:  2010                         FACILITY:  [facility]  PHYSICIAN:  Noralyn Pick. Eden Emms, MD, FACCDATE OF BIRTH:  Nov 18, 1946  DATE OF ADMISSION:  03/23/2011 DATE OF DISCHARGE:  03/25/2011                              DISCHARGE SUMMARY   PROCEDURES:  Chest x-ray.  PRIMARY FINAL DISCHARGE DIAGNOSIS:  Paroxysmal atrial fibrillation with rapid ventricular response.  SECONDARY DIAGNOSES: 1. History of non-ST segment elevation myocardial infarction in March     2002, discharged February 16, 2011, chronically occluded circumflex noted,     with drug-eluting stent to the obtuse marginal 2. 2. Preserved left ventricular function noted at catheterization in     March 2012. 3. Diabetes diagnosed March 2012. 4. Hypertension. 5. Dyslipidemia. 6. Anxiety. 7. Diverticulitis. 8. History of ureteral blockage treated with stent and its subsequent     removal. 9. Tobacco abuse. 10.Hypertension. 11.History of bilateral lower extremity surgery. 12.Allergy or intolerance to PREDNISONE, PERCOCET, SULFA, PENICILLIN     and TYLENOL.  TIME OF DISCHARGE:  33 minutes.  HOSPITAL COURSE:  Melody Casey is a 65 year old female with recently diagnosed coronary artery disease.  She had some tachy palpitations and came to the emergency room where she was in AFib RVR.  She was admitted for further evaluation.  She spontaneously converted to sinus rhythm.  She has a history of bradycardia, so her Cardizem which had been IV was transitioned to low dose p.o. Cardizem.  Coumadin was considered but because the duration was less than 48 hours and her EF is known to be normal by recent measurement it was felt that she could continue on the Effient with aspirin only at this time.  Blood sugars were monitored closely.  TSH was within normal limits.  Cardiac enzymes were cycled and were negative.  She  was on Cardizem 30 mg p.o. q.8 hours, however, doses were held because of bradycardia.  This will not be a discharge medication and she is also off her beta blocker for bradycardia as well.  Her lisinopril/hydrochlorothiazide was held because of hypotension.  She was started back on the lisinopril alone and tolerated this well, although her systolic blood pressure was generally in the low 100s.  On March 25, 2011, Melody Casey was evaluated by Dr. Eden Emms.  She is ambulating without chest pain or shortness of breath and considered stable for discharge, to follow up as an outpatient.  DISCHARGE INSTRUCTIONS:  She is encouraged to stick to a low-sodium diabetic diet.  She is not to use tobacco.  She is to follow up with Dr. Antoine Poche and our office will call her with an appointment.  She is to increase her activity gradually.  She is to follow up with primary care as needed or as scheduled.  DISCHARGE MEDICATIONS: 1. Lisinopril/hydrochlorothiazide is on hold. 2. Lisinopril 10 mg a day. 3. Prasugrel 10 mg a day. 4. Fish oil 1000 mg a day. 5. Niacin 500 mg at bedtime. 6. Alprazolam 0.25 mg at bedtime p.r.n. 7. Metoprolol tartrate 25 mg  one half tablet daily is on hold. 8. Metformin 500 mg a day. 9. Lipitor 40 mg a day. 10.Aspirin 81 mg a day. 11.Sublingual nitroglycerin p.r.n.     Theodore Demark, PA-C   ______________________________ Noralyn Pick. Eden Emms, MD, [facility]    RB/MEDQ  D:  03/25/2011  T:  03/25/2011  Job:  [id number]  Electronically Signed by Theodore Demark PA-C on 03/28/2011 09:46:24 AM Electronically Signed by Charlton Haws MD FACC on 03/29/2011 11:31:45 AM

## 2011-04-04 ENCOUNTER — Encounter: Payer: Self-pay | Admitting: Cardiology

## 2011-04-04 ENCOUNTER — Ambulatory Visit (INDEPENDENT_AMBULATORY_CARE_PROVIDER_SITE_OTHER): Payer: Medicare Other | Admitting: Cardiology

## 2011-04-04 VITALS — BP 152/78 | HR 64 | Ht 68.0 in | Wt 184.0 lb

## 2011-04-04 DIAGNOSIS — F17200 Nicotine dependence, unspecified, uncomplicated: Secondary | ICD-10-CM

## 2011-04-04 DIAGNOSIS — E785 Hyperlipidemia, unspecified: Secondary | ICD-10-CM

## 2011-04-04 DIAGNOSIS — I10 Essential (primary) hypertension: Secondary | ICD-10-CM

## 2011-04-04 DIAGNOSIS — I4891 Unspecified atrial fibrillation: Secondary | ICD-10-CM

## 2011-04-04 DIAGNOSIS — I2510 Atherosclerotic heart disease of native coronary artery without angina pectoris: Secondary | ICD-10-CM

## 2011-04-04 NOTE — Assessment & Plan Note (Signed)
I reviewed her blood pressure diary. Her blood pressure is slightly elevated but her med was just changed. She will keep in the diary. At this point no change in therapy is indicated.

## 2011-04-04 NOTE — Assessment & Plan Note (Signed)
The patient has no new sypmtoms.  No further cardiovascular testing is indicated.  We will continue with aggressive risk reduction and meds as listed.  

## 2011-04-04 NOTE — Assessment & Plan Note (Signed)
I am proud of her for not smoking.  She will continue that. 

## 2011-04-04 NOTE — Patient Instructions (Signed)
Continue current medications  Follow up in 2 months with Dr Antoine Poche

## 2011-04-04 NOTE — Progress Notes (Signed)
HPI The patient presents after recent hospitalization for atrial fibrillation. She had this then broke spontaneously. She had some bradycardia arrhythmias and so beta blockers and calcium channel blockers were held. She was not felt to be high risk so Coumadin was not started.  Since discharge she has had hypertension but no sustained tachycardia.  She does not think she's been back in fibrillation. She has had no SOB, chest discomfort, neck or arm discomfort. She has had no presyncope or syncope. She has had no weight gain or edema. She is still avoiding cigarettes. Because of her blood pressure Dr. Yehuda Budd did start her on Cardizem on the 20th.   Allergies  Allergen Reactions  . Acetaminophen   . Oxycodone-Acetaminophen   . Penicillins   . Prednisone   . Sulfonamide Derivatives     Current Outpatient Prescriptions  Medication Sig Dispense Refill  . ALPRAZolam (XANAX) 0.25 MG tablet Take 0.25 mg by mouth at bedtime as needed.        Marland Kitchen aspirin 81 MG tablet Take 243 mg by mouth daily.        Marland Kitchen atorvastatin (LIPITOR) 40 MG tablet Take 40 mg by mouth daily.        Marland Kitchen diltiazem (CARDIZEM) 90 MG tablet Take 90 mg by mouth daily.        . fish oil-omega-3 fatty acids 1000 MG capsule Take 1 g by mouth daily.        Marland Kitchen ibuprofen (ADVIL,MOTRIN) 200 MG tablet Take 200 mg by mouth every 6 (six) hours as needed.        Marland Kitchen lisinopril (PRINIVIL,ZESTRIL) 10 MG tablet Take 10 mg by mouth daily.        . metFORMIN (GLUMETZA) 500 MG (MOD) 24 hr tablet Take 500 mg by mouth daily with breakfast.        . niacin (NIASPAN) 500 MG CR tablet Take 500 mg by mouth at bedtime.        . nitroGLYCERIN (NITROSTAT) 0.4 MG SL tablet Place 0.4 mg under the tongue every 5 (five) minutes as needed.        . prasugrel (EFFIENT) 10 MG TABS Take 10 mg by mouth. 1 tab daily       . DISCONTD: metoprolol succinate (TOPROL-XL) 25 MG 24 hr tablet Take 25 mg by mouth. 1/2 tab qd       . DISCONTD: lisinopril-hydrochlorothiazide  (PRINZIDE,ZESTORETIC) 20-12.5 MG per tablet Take 1 tablet by mouth daily.        Marland Kitchen DISCONTD: nicotine (NICOTROL) 10 MG inhaler as directed.        Marland Kitchen DISCONTD: olmesartan-hydrochlorothiazide (BENICAR HCT) 40-12.5 MG per tablet Take 1 tablet by mouth daily.          Past Medical History  Diagnosis Date  . HLD (hyperlipidemia)   . HTN (hypertension)   . Arthritis   . Foot deformity     right  . CAD (coronary artery disease)     Circumflex occlusion February 14, 1999 medical treatment,  DES to OM2, LAD  40%, D1 30%  . Tobacco abuse   . Diabetes mellitus   . Atrial fibrillation     Past Surgical History  Procedure Date  . Tonsillectomy and adenoidectomy   . Knee arthroscopy     left  . Tubal ligation   . 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 Ureteral surgery    ROS As stated in the HPI and negative for all  other systems, except for depression  PHYSICAL EXAM BP 152/78  Pulse 64  Ht 5\' 8"  (1.727 m)  Wt 184 lb (83.462 kg)  BMI 27.98 kg/m2 GENERAL:  Well appearing HEENT:  Pupils equal round and reactive, fundi not visualized, oral mucosa unremarkable NECK:  No jugular venous distention, waveform within normal limits, carotid upstroke brisk and symmetric, no bruits, no thyromegaly LYMPHATICS:  No cervical, inguinal adenopathy LUNGS:  Clear to auscultation bilaterally BACK:  No CVA tenderness CHEST:  Unremarkable HEART:  PMI not displaced or sustained,S1 and S2 within normal limits, no S3, no S4, no clicks, no rubs, no murmurs ABD:  Flat, positive bowel sounds normal in frequency in pitch, no bruits, no rebound, no guarding, no midline pulsatile mass, no hepatomegaly, no splenomegaly EXT:  2 plus pulses throughout, no edema, no cyanosis no , 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  ASSESSMENT AND PLAN

## 2011-04-04 NOTE — Assessment & Plan Note (Signed)
We discussed this at length and I reviewed the hospital records.  We discussed strategy should this recur. For now she will continue the medicines as listed.

## 2011-04-10 ENCOUNTER — Encounter: Payer: Self-pay | Admitting: Cardiology

## 2011-04-11 ENCOUNTER — Encounter: Payer: Medicare Other | Admitting: Cardiology

## 2011-04-12 ENCOUNTER — Encounter (HOSPITAL_COMMUNITY)
Admission: RE | Admit: 2011-04-12 | Discharge: 2011-04-12 | Disposition: A | Discharge status: Home / Self Care | Payer: Medicare Other | Source: Ambulatory Visit | Attending: Cardiovascular Disease | Admitting: Cardiovascular Disease

## 2011-04-12 DIAGNOSIS — Z9861 Coronary angioplasty status: Secondary | ICD-10-CM | POA: Insufficient documentation

## 2011-04-12 DIAGNOSIS — I2510 Atherosclerotic heart disease of native coronary artery without angina pectoris: Secondary | ICD-10-CM | POA: Insufficient documentation

## 2011-04-12 DIAGNOSIS — Z5189 Encounter for other specified aftercare: Secondary | ICD-10-CM | POA: Insufficient documentation

## 2011-04-12 DIAGNOSIS — I4891 Unspecified atrial fibrillation: Secondary | ICD-10-CM | POA: Insufficient documentation

## 2011-04-16 ENCOUNTER — Encounter (HOSPITAL_COMMUNITY): Payer: Medicare Other

## 2011-04-18 ENCOUNTER — Encounter (HOSPITAL_COMMUNITY): Payer: Medicare Other

## 2011-04-20 ENCOUNTER — Encounter (HOSPITAL_COMMUNITY)
Admission: RE | Admit: 2011-04-20 | Discharge: 2011-04-20 | Payer: Medicare Other | Source: Ambulatory Visit | Attending: Cardiology | Admitting: Cardiology

## 2011-04-23 ENCOUNTER — Encounter (HOSPITAL_COMMUNITY): Payer: Medicare Other

## 2011-04-24 NOTE — Op Note (Signed)
NAME:  [name], [name] NO.:  [id number]   MEDICAL RECORD NO.:  [id number]          PATIENT TYPE:  AMB   LOCATION:  [facility]                         FACILITY[facility]  [facility]   PHYSICIAN:  Heloise Purpura, MD      DATE OF BIRTH:  10/20/1946   DATE OF PROCEDURE:  12/17/2008  DATE OF DISCHARGE:                               OPERATIVE REPORT   PREOPERATIVE DIAGNOSES:  1. Gross hematuria  2. Right hydronephrosis.   POSTOPERATIVE DIAGNOSES:  1. Gross hematuria.  2. 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   PROCEDURES:  1. Cystoscopy.  2. Bilateral retrograde pyelography.  3. Right ureteroscopy.  4. Right ureteral stent placement (4.8 x 24).  5. Site selected bladder biopsies.  6. Saline bladder washing for cytology.   SURGEON:  Heloise Purpura, MD   ANESTHESIA:  LMA.   ESTIMATED BLOOD LOSS:  Minimal.   SPECIMENS:  1. Bladder biopsies.  2. Saline bladder washing for cytology.   INDICATIONS:  [name] is a 65 year old female who was found to have  incidentally detected right-sided hydronephrosis.  She has had some mild  right-sided flank pain, although was relatively asymptomatic.  She also  recently developed an episode of gross hematuria and does have a history  of tobacco use.  After discussing options for further evaluation and  treatment, she did elect to proceed with further exploration  cystoscopically and possibly ureteroscopically as stated above.  The  potential risks, complications and alternative treatment options  associated with the above procedures were discussed in detail with the  patient and informed consent was obtained.   DESCRIPTION OF PROCEDURE:  The patient was taken to the operating room  and a general anesthetic was administered via an LMA.  She was given  preoperative antibiotics, placed in the dorsal lithotomy position and  prepped and draped in the usual sterile fashion.  Next a preoperative  time-out was performed.  Cystourethroscopy was then performed which  demonstrated a normal urethra.  The ureteral orifices were noted in the  normal anatomic position.  The bladder mucosa was then systematically  examined with no evidence of any obvious bladder tumors.  There were  noted to be erythematous patches along the left posterior aspect of the  bladder as well as near the right side of the trigone.  These were  possibly consistent with carcinoma in situ.  A saline bladder washing  was therefore obtained.  Attention then turned to the left ureteral  orifice which was intubated with a 6 French ureteral catheter.  Contrast  was injected which demonstrated a normal caliber ureter along with an  unremarkable renal pelvis.  No hydronephrosis or filling defects within  the renal pelvis or ureter were noted.   Attention then turned to the right ureteral orifice which was intubated  with a 6 French ureteral catheter.  Contrast was injected which  demonstrated a normal caliber ureter up to a very stenotic ureteropelvic  junction.  Contrast was unable to fully fill the renal pelvis at this  point due to the  fact that she did have right-sided hydronephrosis.  However, no obvious filling defect was noted.  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  However,  based on her history of gross hematuria and long history of tobacco use  she was felt to be at significant risk for a possible urothelial  carcinoma and it was decided to proceed with ureteroscopy to fully  evaluate her renal pelvis and ureteropelvic junction to exclude  absolutely a possible malignancy.  Therefore a 0.038 sensor guidewire  was advanced up to the proximal ureter.  There initially with some  difficulty with the ureter passing into the renal pelvis but it  subsequently was able to be passed into the renal pelvis under  fluoroscopic guidance and curled in the renal pelvis.  A second  wire was  then passed next to the original wire up into the renal pelvis.  This  was a 0.38 Glidewire.  An attempt was then made to pass the flexible  ureteroscope over this working wire but was unable to be passed into the  distal ureter.  It was therefore decided to place a ureteral access  sheath and so a 12/14 ureteral access sheath was advanced over the  original sensor wire.  The second wire had been removed.  The flexible  ureteroscope was then advanced through the ureteral access sheath.  During advancement of the ureteroscope, the wire was noted to have  pulled back into the proximal ureter.  Attempts were then made  unsuccessfully to try to pass a wire back into the renal pelvis.  Contrast was injected through the ureteroscope which demonstrated again  a very narrowed ureteropelvic junction.  At this point, the renal pelvis  was able to be fully filled with contrast and exclude any obvious  filling defects.  The flexible ureteroscope was attempted to be advanced  up to the ureteropelvic junction obstruction to allow the wire to be  replaced into the renal pelvis.  This was unsuccessful and therefore the  flexible ureteroscope was removed with the wire remaining in the  proximal ureter.  The wire was kept in place and the ureteral access  sheath removed.  The semi-rigid ureteroscope was then advanced next to  the working wire up into the proximal ureter.  After multiple attempts,  the wire was able to be manipulated through the ureteropelvic junction  and into the renal pelvis.  At this point, it was decided to simply  place a ureteral stent to relieve any possible obstruction secondary to  right ureteral dilation.  Again, the renal pelvis had been fully filled  out and did not appear to have any filling defects consistent with  malignancy.  The wire was therefore kept in place and then back loaded  over the cystoscope.  An attempt was made to place a 6 x 24 double-J  ureteral  stent which was unsuccessful.  The stent was unable to bypass  the ureteropelvic junction.  It was therefore removed and a 4.8 [place]  Percuflex stent was passed over the wire.  It was able to be navigated  up into the renal pelvis using Seldinger technique.  The wire was then  removed with a good curl noted in the renal pelvis as well as in the  bladder.   Attention then returned to the bladder where the previously mentioned  erythematous areas were biopsied with cold cup biopsy forceps.  These  were sent for permanent pathologic analysis and these areas were  then  fulgurated.  The patient's bladder was emptied and the procedure was  ended.  She tolerated the procedure well and without apparent  complications.  She was able to be awakened and transferred to the  recovery unit in satisfactory condition.      Heloise Purpura, MD  Electronically Signed     LB/MEDQ  D:  12/17/2008  T:  12/18/2008  Job:  [id number]

## 2011-04-25 ENCOUNTER — Encounter (HOSPITAL_COMMUNITY): Payer: Medicare Other

## 2011-04-27 ENCOUNTER — Encounter (HOSPITAL_COMMUNITY): Payer: Medicare Other

## 2011-04-27 NOTE — Assessment & Plan Note (Signed)
[facility]   NAME:  Melody Casey, Melody Casey NO.:  [id number]   MEDICAL RECORD NO.:  [id number]      DATE OF BIRTH:  [birth year]   PHYSICIAN:  Theresia Majors. Tanda Rockers, M.D. 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  She has had difficulty with  footwear.  She was seen a week ago, and Dr. Leanord Hawking treated her with  topicals, as well as offloading maneuvers.  She reports that her wound has  responded dramatically.   OBJECTIVE:  Her blood pressure is 140/90.  Respiration is 16.  Pulse rate is  74.  She is afebrile.  Inspection of the right foot shows that the previously identified wound on  the lateral right foot is completely resolved.  There remains an area of  callus that corresponds with the previous ulcer.   IMPRESSION:  Marked foot deformity.   RECOMMENDATION:  We have given the patient a prescription for a custom shoe  and insert to offload this area.  We will reevaluate her once she has the  proper footwear.  We have indicated a willingness to write a letter of  medical necessity to her insurance carrier.           ______________________________  Theresia Majors Tanda Rockers, M.D.     Melody Casey  D:  10/21/2006  T:  10/22/2006  Job:  [id number]   cc:   Deniece Portela C. Dorna Bloom, M.D.

## 2011-04-27 NOTE — Assessment & Plan Note (Signed)
[facility]   NAME:  Melody Casey, Melody Casey NO.:  [id number]   MEDICAL RECORD NO.:  [id number]      DATE OF BIRTH:  06-Jun-1946   PHYSICIAN:  Maxwell Caul, M.D.      VISIT DATE:                                     OFFICE VISIT   PURPOSE OF TODAY'S VISIT:  Review of small right-sided foot wound over the  fifth metatarsal head.   HISTORY AND PHYSICAL:  Melody Casey apparently developed a painful swelling  on or about her right fifth metatarsal head perhaps a month ago.  This was  surgically incised, grew staph aureus (by her description not MRSA).  She  was treated with a course of Keflex and doxycycline which apparently she has  completed.  She also went on to have MRI of her foot as directed by her  primary physician who is Dr. Thomos Lemons and I have reviewed this report.  There was no specific evidence of osteomyelitis showing only forefoot  cellulitis with possible superficial abscess lateral to the fifth metatarsal  head, this was done on October 10.   She has been referred here for review of the right foot wound.   PAST MEDICAL HISTORY:  Includes:  1. Hypertension.  2. Hyperlipidemia.  3. 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      Apparently since then, she has had a right leg which is much less in      circumference than the left leg.  As she grew older, it has also caused      her to have right foot deformity.  She also has a leg-length      discrepancy.  If there is an exact diagnosis to this, I think this is      uncertain and has been probably lost in antiquity.   EXAMINATION:  SKIN:  Her wound is actually a very tiny wound on the lateral  aspect of the right fifth metatarsal head.  There was some callous over this  and it was opened slightly and a small amount of retained packing was  removed.  Nevertheless, the base of this appears to  be clean.  There is some  tenderness to palpation inferior to this area, however no purulence was  expressed and there was no evidence of coexisting infection currently.  I  believe this has been thoroughly treated.  There is some remaining callous  around the fifth metatarsal head, probably from longstanding pressure  although the patient has recently spent a lot of money for different  footwear, this was not medical footwear.   Circulation is intact.  Neurologically, in spite of the look of her right  leg, her knee jerk is intact and her peripheral sensation appears to be  intact as well.  There are other forefoot deformities including of a right  second toe which is markedly hammered.  All of this appears to be chronic,  but the shape of her foot has changed through the years.   IMPRESSIONS:  1. Small ulceration on the right fifth metatarsal head laterally.  This is  likely secondary to an abscess, has been properly treated.  There was      some retained packing here which I removed, hopefully will allow      closure of this wound.  I gave her Neosporin and a leave-in cover to      relieve the pressure here.  I am quite confident that this will close      over.  The MRI did not suggest a virtual space under this wound;      therefore, there is probably going to be complete resolution.  2. Pressure over the right fifth metatarsal head, I discussed this with      the patient.  She has just bought a new pair of footwear, I would      suggest at some point considering her for medical footwear which would      be custom made for her, especially if this does not appear to be      relieving the pressure over her right fifth metatarsal head.  3. 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   I think this wound is going to close over.  I did give her an appointment to  come back here in a  month although I am at least a bit optimistic she will  not require this.  The removal of small amount of retained packing should  allow this to close over.  There was no evidence of significant cellulitis  at present.           ______________________________  Maxwell Caul, M.D.     MGR/MEDQ  D:  09/23/2006  T:  09/23/2006  Job:  [id number]

## 2011-04-30 ENCOUNTER — Encounter (HOSPITAL_COMMUNITY): Payer: Medicare Other

## 2011-05-02 ENCOUNTER — Encounter (HOSPITAL_COMMUNITY): Payer: Medicare Other

## 2011-05-02 ENCOUNTER — Emergency Department (HOSPITAL_COMMUNITY): Payer: Medicare Other

## 2011-05-02 ENCOUNTER — Inpatient Hospital Stay (HOSPITAL_COMMUNITY)
Admission: EM | Admit: 2011-05-02 | Discharge: 2011-05-05 | DRG: 392 | Disposition: A | Discharge status: Home / Self Care | Payer: Medicare Other | Attending: Internal Medicine | Admitting: Internal Medicine

## 2011-05-02 DIAGNOSIS — E119 Type 2 diabetes mellitus without complications: Secondary | ICD-10-CM | POA: Diagnosis present

## 2011-05-02 DIAGNOSIS — Z7982 Long term (current) use of aspirin: Secondary | ICD-10-CM

## 2011-05-02 DIAGNOSIS — I4891 Unspecified atrial fibrillation: Secondary | ICD-10-CM | POA: Diagnosis present

## 2011-05-02 DIAGNOSIS — I10 Essential (primary) hypertension: Secondary | ICD-10-CM | POA: Diagnosis present

## 2011-05-02 DIAGNOSIS — K5732 Diverticulitis of large intestine without perforation or abscess without bleeding: Principal | ICD-10-CM | POA: Diagnosis present

## 2011-05-02 DIAGNOSIS — I252 Old myocardial infarction: Secondary | ICD-10-CM

## 2011-05-02 DIAGNOSIS — E8809 Other disorders of plasma-protein metabolism, not elsewhere classified: Secondary | ICD-10-CM | POA: Diagnosis present

## 2011-05-02 DIAGNOSIS — K630 Abscess of intestine: Secondary | ICD-10-CM | POA: Diagnosis present

## 2011-05-02 DIAGNOSIS — E236 Other disorders of pituitary gland: Secondary | ICD-10-CM | POA: Diagnosis present

## 2011-05-02 DIAGNOSIS — F411 Generalized anxiety disorder: Secondary | ICD-10-CM | POA: Diagnosis present

## 2011-05-02 DIAGNOSIS — E876 Hypokalemia: Secondary | ICD-10-CM | POA: Diagnosis present

## 2011-05-02 DIAGNOSIS — Z9861 Coronary angioplasty status: Secondary | ICD-10-CM

## 2011-05-02 DIAGNOSIS — I2510 Atherosclerotic heart disease of native coronary artery without angina pectoris: Secondary | ICD-10-CM | POA: Diagnosis present

## 2011-05-02 DIAGNOSIS — E785 Hyperlipidemia, unspecified: Secondary | ICD-10-CM | POA: Diagnosis present

## 2011-05-02 LAB — COMPREHENSIVE METABOLIC PANEL
ALT: 13 U/L (ref 0–35)
AST: 14 U/L (ref 0–37)
Albumin: 2.8 g/dL — ABNORMAL LOW (ref 3.5–5.2)
Calcium: 9.5 mg/dL (ref 8.4–10.5)
Creatinine, Ser: 0.71 mg/dL (ref 0.4–1.2)
GFR calc Af Amer: >60 mL/min (ref >60)
Sodium: 127 mEq/L — ABNORMAL LOW (ref 135–145)

## 2011-05-02 LAB — DIFFERENTIAL
Basophils Absolute: 0 10*3/uL (ref 0.0–0.1)
Basophils Relative: 0 % (ref 0–1)
Eosinophils Absolute: 0 10*3/uL (ref 0.0–0.7)
Monocytes Absolute: 0.7 10*3/uL (ref 0.1–1.0)
Monocytes Relative: 5 % (ref 3–12)
Neutro Abs: 10.5 10*3/uL — ABNORMAL HIGH (ref 1.7–7.7)
Neutrophils Relative %: 84 % — ABNORMAL HIGH (ref 43–77)

## 2011-05-02 LAB — CK TOTAL AND CKMB (NOT AT ARMC)
CK, MB: 5.7 ng/mL — ABNORMAL HIGH (ref 0.3–4.0)
Total CK: 162 U/L (ref 7–177)
Total CK: 168 U/L (ref 7–177)

## 2011-05-02 LAB — URINE MICROSCOPIC-ADD ON

## 2011-05-02 LAB — CBC
Hemoglobin: 11.6 g/dL — ABNORMAL LOW (ref 12.0–15.0)
MCH: 30.1 pg (ref 26.0–34.0)
MCHC: 35.3 g/dL (ref 30.0–36.0)
Platelets: 296 10*3/uL (ref 150–400)
RBC: 3.86 MIL/uL — ABNORMAL LOW (ref 3.87–5.11)

## 2011-05-02 LAB — TROPONIN I: Troponin I: 0.76 ng/mL (ref <0.30)

## 2011-05-02 LAB — MRSA PCR SCREENING: MRSA by PCR: NEGATIVE

## 2011-05-02 LAB — GLUCOSE, CAPILLARY: Glucose-Capillary: 134 mg/dL — ABNORMAL HIGH (ref 70–99)

## 2011-05-02 LAB — URINALYSIS, ROUTINE W REFLEX MICROSCOPIC
Glucose, UA: NEGATIVE mg/dL
Hgb urine dipstick: NEGATIVE
Specific Gravity, Urine: 1.022 (ref 1.005–1.030)
pH: 6 (ref 5.0–8.0)

## 2011-05-02 MED ORDER — IOHEXOL 300 MG/ML  SOLN
100.0000 mL | Freq: Once | INTRAMUSCULAR | Status: AC | PRN
  Administered 2011-05-02: 100 mL via INTRAVENOUS

## 2011-05-03 LAB — CBC
MCH: 29.6 pg (ref 26.0–34.0)
MCHC: 34.8 g/dL (ref 30.0–36.0)
MCV: 85.2 fL (ref 78.0–100.0)
Platelets: 250 10*3/uL (ref 150–400)
RBC: 3.24 MIL/uL — ABNORMAL LOW (ref 3.87–5.11)

## 2011-05-03 LAB — GLUCOSE, CAPILLARY: Glucose-Capillary: 140 mg/dL — ABNORMAL HIGH (ref 70–99)

## 2011-05-03 LAB — BASIC METABOLIC PANEL
BUN: 11 mg/dL (ref 6–23)
CO2: 23 mEq/L (ref 19–32)
Calcium: 8.7 mg/dL (ref 8.4–10.5)
Glucose, Bld: 113 mg/dL — ABNORMAL HIGH (ref 70–99)
Sodium: 128 mEq/L — ABNORMAL LOW (ref 135–145)

## 2011-05-03 LAB — URINE CULTURE
Culture  Setup Time: 201205231255
Culture: NO GROWTH

## 2011-05-03 NOTE — H&P (Signed)
NAME:  Melody Casey, Melody Casey NO.:  [id number]  MEDICAL RECORD NO.:  [id number]           PATIENT TYPE:  I  LOCATION:  3314                         FACILITY:  [facility]  PHYSICIAN:  Hillery Aldo, M.D.   DATE OF BIRTH:  Feb 10, 1946  DATE OF ADMISSION:  05/02/2011 DATE OF DISCHARGE:                             HISTORY & PHYSICAL   PRIMARY CARE PHYSICIAN:  Tammy R. Collins Scotland, MD  CARDIOLOGIST:  Noralyn Pick. Eden Emms, MD, [facility]  CHIEF COMPLAINT:  Fever, nausea, abdominal pain.  HISTORY OF PRESENT ILLNESS:  The patient is a 65 year old female with past medical history of diverticulosis who presents to the hospital with 2-week history of intermittent left lower quadrant abdominal pain.  The patient apparently saw Dr. Collins Scotland and was put on Cipro as an outpatient with no significant relief.  She states that the Cipro caused her to become increasingly nauseated, but up until last night, had no frank vomiting.  She rated the abdominal pain as an 8/10 at its worst.  She subsequently followed up with Dr. Collins Scotland on May 01, 2011, and reported having some constipation.  She was advised to take 2 doses of MiraLax and an electrolyte solution.  The patient states that she bought some Pedialyte and subsequently developed hives and swelling of the upper extremities.  She took Benadryl, but had a bad night with fevers and worsening abdominal pain.  Her PCP did advice her to come to the hospital for further evaluation, which she did.  Upon initial evaluation in the emergency department, the patient was found to have atrial fibrillation with rapid ventricular response and a CT scan confirmed acute diverticulitis with a pericolonic phlegmon and so she was referred to the Hospitalist Service for further evaluation and treatment.  PAST MEDICAL HISTORY: 1. Paroxysmal atrial fibrillation with rapid ventricular response. 2. History of non-ST-elevation MI and coronary artery disease status     post  stent. 3. Type 2 diabetes. 4. Hypertension. 5. Dyslipidemia. 6. Anxiety. 7. History of diabetic neuropathic foot ulcer. 8. Diverticulosis. 9. Right hydronephrosis with ureteral blockage status post stent and     stent removal. 10.Status post left knee surgery. 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 12.History of tonsillectomy.  FAMILY HISTORY:  The patient's father died of lung cancer at age 29. The patient's mother died of congestive heart failure at age 39.  She has a brother who is alive and has diabetes.  She has two healthy offspring.  SOCIAL HISTORY:  The patient is married and has a 50 pack-year history of tobacco smoking.  She quit recently.  No history of alcohol or drug use.  She is retired and previously worked as a Recruitment consultant.  ALLERGIES:  PREDNISONE, PERCOCET, SULFA, PENICILLIN, and TYLENOL.  CURRENT MEDICATIONS: 1. Metformin 500 mg p.o. at bedtime. 2. Prasugrel 10 mg p.o. daily. 3. Nitroglycerin 0.4 mg sublingual q.5 minutes p.r.n. up to 3 doses     for chest pain. 4. Niacin SR 500 mg p.o. daily at bedtime. 5. Lisinopril 10 mg p.o. daily. 6. Ibuprofen 200 mg, 1-2 tablets p.o. q.6 h. p.r.n.  pain. 7. Fish oil 1000 mg p.o. daily. 8. Diltiazem 90 mg p.o. daily. 9. Lipitor 40 mg p.o. at bedtime. 10.Aspirin 81 mg daily. 11.Alprazolam 0.25 mg p.o. at bedtime p.r.n. anxiety.  REVIEW OF SYSTEMS:  CONSTITUTIONAL:  Diminished appetite.  Positive for fever and chills.  No weight loss or weight gain.  HEENT:  No complaints.  CARDIOVASCULAR:  No chest pain or subjective dysrhythmia. RESPIRATORY:  No shortness of breath or cough.  GI:  Positive for nausea and vomiting.  No melena or hematochezia.  No diarrhea.  Recent constipation.  GU:  No dysuria or hematuria.  MUSCULOSKELETAL:  No current complaints.  Comprehensive 14-point review of systems otherwise unremarkable.  PHYSICAL EXAMINATION:  VITAL SIGNS:  Temperature 99.1,  pulse 115, respirations 19, blood pressure 91/53, O2 saturation 96% on 2 L. GENERAL:  Well-developed, well-nourished female in no acute distress. HEENT:  Normocephalic, atraumatic.  PERRL.  EOMI.  Oropharynx reveals dentures.  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 NECK:  Supple, no thyromegaly, lymphadenopathy, no jugular venous distention. CHEST:  Diminished breath sounds bilaterally, but clear. HEART:  Heart sounds are irregularly irregular.  Tachycardic rate. ABDOMEN:  Soft.  Tender to the left lower quadrant.  Slightly distended. EXTREMITIES:  No clubbing, edema, or cyanosis. SKIN:  Warm and dry.  Erythema and edema to the upper extremities. NEUROLOGIC:  The patient is alert and oriented x3.  Cranial nerves II through XII grossly intact.  Nonfocal.  DATA REVIEW:  CT scan of the abdomen and pelvis shows acute diverticulitis with pericolonic phlegmon and free fluid in left lower quadrant without discrete abscess or free air.  Chest x-ray shows no acute findings.  12-lead EKG shows atrial fibrillation with rapid ventricular response and a ventricular rate of 145 beats per minute.  Repolarization abnormalities suggestive of ischemia.  LABORATORY VALUES:  White blood cell count is 12.6, hemoglobin 11.6, hematocrit 32.9, platelets 296.  Sodium is 127, potassium 4.0, chloride 93, bicarb 22, BUN 16, creatinine 0.71, glucose 157, calcium 9.5, total bilirubin 0.6, alkaline phosphatase 98, AST 14, ALT 13, total protein 7.0, albumin 2.8.  Lipase is 11.  Urinalysis reveals negative nitrites, but moderate leukocytes.  Negative for blood, but 30 mg/dL of protein. Urine microscopy shows 3-6 white blood cells and rare bacteria.  ASSESSMENT/PLAN: 1. Diverticulitis with phlegmon:  We will admit the patient and start     her on empiric Cipro and Flagyl.  We will monitor her closely for     signs of decompensation.  She is currently nontoxic in appearance. 2. Urinary tract  infection:  The patient does have evidence of urinary     tract infection though she is asymptomatic.  We will send off urine     culture and start her on Cipro for treatment of,     a.     Which should cover urinary pathogens. 3. 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  We will gently     hydrate her with normal saline and if she remains hyponatremic,     initiate further workup. 4. Atrial fibrillation with rapid ventricular response:  Likely     induced by acute infection and pain.  The patient has been placed     on a Cardizem drip and we will titrate this to off as tolerated.     We will resume her p.o. Cardizem once her nausea is improved. 5. Adverse drug reaction to Pedialyte versus MiraLax:  The patient  will be started on IV Pepcid and p.r.n. Benadryl.  No indication     currently exists for treatment with steroids at this time. 6. Mild normocytic anemia:  The patient's normocytic anemia is mild     and we will not work this up further presently unless she continues     to drop her hemoglobin. 7. Hypoalbuminemia/proteinuria:  The patient does have hypoalbuminemia     in the setting of proteinuria, which is likely causing a low serum     albumin levels.  Her kidney function is currently normal.  This may     be an acute phase reaction to her acute illness. 8. Coronary artery disease:  The patient will be continued on her     usual therapies including aspirin, statin therapy, niacin, and     blood pressure medications as tolerated. 9. Hypertension:  The patient is mildly hypotensive.  We will hold her     antihypertensives for systolic blood pressure of less than 100. 10.Dyslipidemia:  Continue the patient's niacin, fish oil, and     Lipitor. 11.Anxiety:  Continue the patient's Xanax. 12.Prophylaxis:  We will place the patient on DVT prophylaxis.  Time spent on admission including face-to-face time equals approximately 1 hour.     Hillery Aldo,  M.D.     CR/MEDQ  D:  05/02/2011  T:  05/03/2011  Job:  [id number]  cc:   Noralyn Pick. Eden Emms, MD, [facility] Tammy R. Collins Scotland, M.D.  Electronically Signed by Hillery Aldo M.D. on 05/03/2011 12:05:38 PM

## 2011-05-04 ENCOUNTER — Encounter (HOSPITAL_COMMUNITY): Payer: Medicare Other

## 2011-05-04 LAB — BASIC METABOLIC PANEL
BUN: 7 mg/dL (ref 6–23)
CO2: 23 mEq/L (ref 19–32)
Glucose, Bld: 110 mg/dL — ABNORMAL HIGH (ref 70–99)
Potassium: 3.6 mEq/L (ref 3.5–5.1)
Sodium: 137 mEq/L (ref 135–145)

## 2011-05-04 LAB — GLUCOSE, CAPILLARY
Glucose-Capillary: 112 mg/dL — ABNORMAL HIGH (ref 70–99)
Glucose-Capillary: 127 mg/dL — ABNORMAL HIGH (ref 70–99)

## 2011-05-05 LAB — BASIC METABOLIC PANEL
Chloride: 108 mEq/L (ref 96–112)
Potassium: 4 mEq/L (ref 3.5–5.1)
Sodium: 139 mEq/L (ref 135–145)

## 2011-05-05 LAB — CBC
Platelets: 307 10*3/uL (ref 150–400)
RBC: 3.56 MIL/uL — ABNORMAL LOW (ref 3.87–5.11)
WBC: 5.5 10*3/uL (ref 4.0–10.5)

## 2011-05-07 ENCOUNTER — Encounter (HOSPITAL_COMMUNITY): Payer: Medicare Other

## 2011-05-09 ENCOUNTER — Encounter (HOSPITAL_COMMUNITY): Payer: Medicare Other

## 2011-05-10 NOTE — Discharge Summary (Signed)
NAME:  Melody Casey, Melody Casey NO.:  [id number]  MEDICAL RECORD NO.:  [id number]           PATIENT TYPE:  I  LOCATION:  2028                         FACILITY:  [facility]  PHYSICIAN:  Peggye Pitt, M.D. DATE OF BIRTH:  07-15-1946  DATE OF ADMISSION:  05/02/2011 DATE OF DISCHARGE:  05/05/2011                              DISCHARGE SUMMARY   PRIMARY CARE PHYSICIAN:  Tammy R. Collins Scotland, MD  CARDIOLOGIST:  Noralyn Pick. Eden Emms, MD, [facility]  DISCHARGE DIAGNOSES: 1. Acute sigmoid diverticulitis, improved. 2. Atrial fibrillation with rapid ventricular response, now in normal     sinus rhythm. 3. Increased troponin, likely a leak from atrial fibrillation.  No     evidence of non-ST-segment elevation myocardial infarction. 4. Type 2 diabetes. 5. Hypertension. 6. Hyperlipidemia. 7. Anxiety. 8. Diverticulosis. 9. History of right hydronephrosis with ureteral blockage status post     stent and stone removal.  DISCHARGE MEDICATIONS: 1. Cipro 250 mg twice daily for 21 days. 2. Flagyl 500 mg three times a day for 21 days. 3. Vicodin 5/500 mg one tablet every 8 hours as needed for pain. 4. Xanax 0.25 mg at bedtime as needed for anxiety. 5. Aspirin 81 mg daily. 6. Diltiazem 90 mg daily. 7. Fish oil 1000 mg daily. 8. Ibuprofen 200 mg 1-2 tablets every 6 hours as needed for pain, 9. Lipitor 40 mg daily. 10.Lisinopril 10 mg daily. 11.Metformin 500 mg daily. 12.Niacin 500 mg daily. 13.Nitroglycerin 0.4 mg sublingual every 5 minutes up to three times     as needed for chest pain. 14.Prasugrel 10 mg daily.  PAST MEDICAL HISTORY:  History of non-ST-segment elevation myocardial infarction and coronary artery disease status post stent placement.  DISPOSITION AND FOLLOWUP:  Ms. Vandruff will be discharged home today in stable and improved condition to complete a total 3-week course of antibiotics for her diverticulitis.  She has been instructed to follow up with her a primary care physician  within 2 weeks for hospital followup.  CONSULTATIONS THIS HOSPITALIZATION:  Theron Arista C. Eden Emms, MD, [facility], with Cardiology.  IMAGES AND PROCEDURES:  Chest x-ray on May 02, 2011, with no acute findings. CT scan of the abdomen and pelvis on May 02, 2011, that showed acute diverticulitis of the sigmoid colon with a pericolonic phlegmon and free fluid in the left lower quadrant without a discrete abscess or free air.  HISTORY AND PHYSICAL:  For complete details, please see dictation on May 03, 2011, by Dr. .  In brief, Ms. Carton is a pleasant 65 year old Caucasian lady with a history of coronary artery disease with stent placement in March 2012 who presented to the hospital with low-grade fever, nausea, and abdominal pain.  She had a CT scan in the ED with findings as above and hence we are asked to admit her for further evaluation and management.  HOSPITAL COURSE BY PROBLEM: 1. Acute sigmoid diverticulitis.  She was placed on IV Cipro, Flagyl,     initially on clear liquids.  Her diet has been advanced.  She is no     longer nauseous, no longer having abdominal pain, tolerating a  solid diet.  Her antibiotics were changed to an oral form 24 hours     prior to discharge.  She is doing well and anxious to go home     today. 2. Atrial fibrillation with rapid ventricular response.  She does have     a history of AFib.  We presume this was secondary to response to     her pain and acute inflammatory process.  She did require short     stay in the intensive care unit on IV Cardizem.  She has now been     transitioned over to oral form of Cardizem and in fact, she has     converted to normal sinus rhythm.  It is unclear to me as to why     she is not maintained on chronic anticoagulation.  However, she     chronically follows up with her cardiologist and I will leave this     decision up to him.  She certainly would qualify for     anticoagulation given her CHADS score. 3. Mild increase in  troponin.  Her troponin did peak at about 1.012     with normal CK-MBs and total CKs.  This was presumably a troponin     leak related to her atrial fibrillation and acute inflammatory     process.  She never experienced any chest pain or any acute     ischemic changes on her EKG. 4. History of non-ST-elevated MI and coronary artery disease with     recent stent placement in March 2012.  Her prasugrel was continued     throughout this hospitalization.  Prior cardiology notes document     the fact that she needs to be on it for at least 12 months.  She     has also been maintained on her aspirin. 5. All the rest of her chronic conditions have been stable.  VITAL SIGNS ON DAY OF DISCHARGE:  Blood pressure 126/76, heart rate 74, respirations 18, sats 93% on room air, and temperature 98.3.     Peggye Pitt, M.D.     EH/MEDQ  D:  05/05/2011  T:  05/05/2011  Job:  [id number]  cc:   Tammy R. Collins Scotland, M.D. Noralyn Pick. Eden Emms, MD, [facility]  Electronically Signed by Peggye Pitt M.D. on 05/10/2011 07:43:26 AM

## 2011-05-11 ENCOUNTER — Encounter (HOSPITAL_COMMUNITY): Payer: Medicare Other | Attending: Cardiology

## 2011-05-11 DIAGNOSIS — I4891 Unspecified atrial fibrillation: Secondary | ICD-10-CM | POA: Insufficient documentation

## 2011-05-11 DIAGNOSIS — I2510 Atherosclerotic heart disease of native coronary artery without angina pectoris: Secondary | ICD-10-CM | POA: Insufficient documentation

## 2011-05-11 DIAGNOSIS — Z5189 Encounter for other specified aftercare: Secondary | ICD-10-CM | POA: Insufficient documentation

## 2011-05-11 DIAGNOSIS — Z9861 Coronary angioplasty status: Secondary | ICD-10-CM | POA: Insufficient documentation

## 2011-05-14 ENCOUNTER — Encounter (HOSPITAL_COMMUNITY): Payer: Medicare Other

## 2011-05-16 ENCOUNTER — Encounter (HOSPITAL_COMMUNITY): Payer: Medicare Other

## 2011-05-17 ENCOUNTER — Inpatient Hospital Stay (HOSPITAL_COMMUNITY)
Admission: EM | Admit: 2011-05-17 | Discharge: 2011-05-21 | DRG: 310 | Disposition: A | Discharge status: Home / Self Care | Payer: Medicare Other | Attending: Internal Medicine | Admitting: Internal Medicine

## 2011-05-17 DIAGNOSIS — I2510 Atherosclerotic heart disease of native coronary artery without angina pectoris: Secondary | ICD-10-CM | POA: Diagnosis present

## 2011-05-17 DIAGNOSIS — Z7982 Long term (current) use of aspirin: Secondary | ICD-10-CM

## 2011-05-17 DIAGNOSIS — Z9861 Coronary angioplasty status: Secondary | ICD-10-CM

## 2011-05-17 DIAGNOSIS — E785 Hyperlipidemia, unspecified: Secondary | ICD-10-CM | POA: Diagnosis present

## 2011-05-17 DIAGNOSIS — I10 Essential (primary) hypertension: Secondary | ICD-10-CM | POA: Diagnosis present

## 2011-05-17 DIAGNOSIS — I252 Old myocardial infarction: Secondary | ICD-10-CM

## 2011-05-17 DIAGNOSIS — E119 Type 2 diabetes mellitus without complications: Secondary | ICD-10-CM | POA: Diagnosis present

## 2011-05-17 DIAGNOSIS — I4891 Unspecified atrial fibrillation: Principal | ICD-10-CM | POA: Diagnosis present

## 2011-05-17 DIAGNOSIS — E876 Hypokalemia: Secondary | ICD-10-CM | POA: Diagnosis present

## 2011-05-17 DIAGNOSIS — F411 Generalized anxiety disorder: Secondary | ICD-10-CM | POA: Diagnosis present

## 2011-05-17 DIAGNOSIS — I495 Sick sinus syndrome: Secondary | ICD-10-CM | POA: Diagnosis present

## 2011-05-18 ENCOUNTER — Emergency Department (HOSPITAL_COMMUNITY): Payer: Medicare Other

## 2011-05-18 ENCOUNTER — Encounter (HOSPITAL_COMMUNITY): Payer: Medicare Other

## 2011-05-18 DIAGNOSIS — R002 Palpitations: Secondary | ICD-10-CM

## 2011-05-18 LAB — BASIC METABOLIC PANEL
Chloride: 107 mEq/L (ref 96–112)
Chloride: 108 mEq/L (ref 96–112)
Creatinine, Ser: 0.6 mg/dL (ref 0.4–1.2)
GFR calc Af Amer: >60 mL/min (ref >60)
GFR calc Af Amer: >60 mL/min (ref >60)
Potassium: 4.3 mEq/L (ref 3.5–5.1)

## 2011-05-18 LAB — CARDIAC PANEL(CRET KIN+CKTOT+MB+TROPI)
CK, MB: 2.1 ng/mL (ref 0.3–4.0)
Relative Index: INVALID (ref 0.0–2.5)
Total CK: 43 U/L (ref 7–177)
Troponin I: <0.3 ng/mL (ref <0.30)

## 2011-05-18 LAB — DIFFERENTIAL
Basophils Relative: 1 % (ref 0–1)
Lymphocytes Relative: 28 % (ref 12–46)
Lymphs Abs: 2 10*3/uL (ref 0.7–4.0)
Monocytes Relative: 7 % (ref 3–12)
Neutro Abs: 4.5 10*3/uL (ref 1.7–7.7)
Neutrophils Relative %: 63 % (ref 43–77)

## 2011-05-18 LAB — CBC
Hemoglobin: 12.6 g/dL (ref 12.0–15.0)
MCH: 29.6 pg (ref 26.0–34.0)
MCV: 86.9 fL (ref 78.0–100.0)
RBC: 4.26 MIL/uL (ref 3.87–5.11)

## 2011-05-18 LAB — GLUCOSE, CAPILLARY
Glucose-Capillary: 130 mg/dL — ABNORMAL HIGH (ref 70–99)
Glucose-Capillary: 185 mg/dL — ABNORMAL HIGH (ref 70–99)

## 2011-05-18 LAB — MAGNESIUM
Magnesium: 2.1 mg/dL (ref 1.5–2.5)
Magnesium: 2.1 mg/dL (ref 1.5–2.5)

## 2011-05-18 LAB — TROPONIN I: Troponin I: <0.3 ng/mL (ref <0.30)

## 2011-05-18 LAB — CK TOTAL AND CKMB (NOT AT ARMC): Relative Index: INVALID (ref 0.0–2.5)

## 2011-05-19 LAB — GLUCOSE, CAPILLARY
Glucose-Capillary: 110 mg/dL — ABNORMAL HIGH (ref 70–99)
Glucose-Capillary: 124 mg/dL — ABNORMAL HIGH (ref 70–99)
Glucose-Capillary: 123 mg/dL — ABNORMAL HIGH (ref 70–99)
Glucose-Capillary: 134 mg/dL — ABNORMAL HIGH (ref 70–99)

## 2011-05-19 LAB — MAGNESIUM: Magnesium: 2.2 mg/dL (ref 1.5–2.5)

## 2011-05-19 LAB — CBC
HCT: 34.1 % — ABNORMAL LOW (ref 36.0–46.0)
MCH: 29.5 pg (ref 26.0–34.0)
MCHC: 33.4 g/dL (ref 30.0–36.0)
MCV: 88.1 fL (ref 78.0–100.0)
RDW: 16 % — ABNORMAL HIGH (ref 11.5–15.5)

## 2011-05-19 LAB — COMPREHENSIVE METABOLIC PANEL
BUN: 11 mg/dL (ref 6–23)
CO2: 22 mEq/L (ref 19–32)
Calcium: 9.1 mg/dL (ref 8.4–10.5)
Creatinine, Ser: 0.61 mg/dL (ref 0.4–1.2)
GFR calc Af Amer: >60 mL/min (ref >60)
GFR calc non Af Amer: >60 mL/min (ref >60)
Glucose, Bld: 118 mg/dL — ABNORMAL HIGH (ref 70–99)
Total Bilirubin: 0.5 mg/dL (ref 0.3–1.2)

## 2011-05-19 LAB — DIFFERENTIAL
Basophils Absolute: 0.1 10*3/uL (ref 0.0–0.1)
Eosinophils Relative: 4 % (ref 0–5)
Lymphocytes Relative: 40 % (ref 12–46)
Monocytes Absolute: 0.4 10*3/uL (ref 0.1–1.0)
Monocytes Relative: 7 % (ref 3–12)

## 2011-05-19 LAB — PROTIME-INR: INR: 1.16 (ref 0.00–1.49)

## 2011-05-19 LAB — PHOSPHORUS: Phosphorus: 3.7 mg/dL (ref 2.3–4.6)

## 2011-05-20 DIAGNOSIS — I4891 Unspecified atrial fibrillation: Secondary | ICD-10-CM

## 2011-05-20 LAB — GLUCOSE, CAPILLARY: Glucose-Capillary: 126 mg/dL — ABNORMAL HIGH (ref 70–99)

## 2011-05-20 LAB — BASIC METABOLIC PANEL
CO2: 25 mEq/L (ref 19–32)
Chloride: 105 mEq/L (ref 96–112)
GFR calc Af Amer: >60 mL/min (ref >60)
Sodium: 138 mEq/L (ref 135–145)

## 2011-05-21 ENCOUNTER — Encounter (HOSPITAL_COMMUNITY): Payer: Medicare Other

## 2011-05-21 LAB — BASIC METABOLIC PANEL
BUN: 14 mg/dL (ref 6–23)
Creatinine, Ser: 0.48 mg/dL (ref 0.4–1.2)
GFR calc Af Amer: >60 mL/min (ref >60)
GFR calc non Af Amer: >60 mL/min (ref >60)
Potassium: 4 mEq/L (ref 3.5–5.1)

## 2011-05-21 LAB — PROTIME-INR: Prothrombin Time: 13.8 seconds (ref 11.6–15.2)

## 2011-05-23 ENCOUNTER — Telehealth: Payer: Self-pay | Admitting: Cardiology

## 2011-05-23 ENCOUNTER — Other Ambulatory Visit: Payer: Self-pay | Admitting: Cardiology

## 2011-05-23 ENCOUNTER — Encounter (HOSPITAL_COMMUNITY): Payer: Medicare Other

## 2011-05-23 LAB — BASIC METABOLIC PANEL
BUN: 12 mg/dL (ref 6–23)
Chloride: 103 mEq/L (ref 96–112)
Glucose, Bld: 120 mg/dL — ABNORMAL HIGH (ref 70–99)
Potassium: 4.3 mEq/L (ref 3.5–5.3)

## 2011-05-23 LAB — PLATELET INHIBITION P2Y12: Platelet Function  P2Y12: 213 [PRU] (ref 194–418)

## 2011-05-23 NOTE — Telephone Encounter (Signed)
Pt receive lab order from rhonda pa prior to d.c to come to office to set up an appt. pls advise.

## 2011-05-23 NOTE — Telephone Encounter (Signed)
Patient had an order for labs to be drawn at Dr Yehuda Budd office today while she was getting her INR drawn.  She went to have it drawn and they do not draw a P2Y12 Platelet inhibition test(pt on Plavix) and also needs a BMP for hypokalemia Patient is going to come to [facility] and have labs drawn  Her INR was 1.6 at Dr Yehuda Budd  Spoke with Margorie John, RN who informed me that we do not draw this lad either  Patient will have to go to [facility] to have drawn.  Tried to call patient but she had already left home coming to office  Spoke with Ethelene Browns patient is to go to Main lab at [facility] to have drawn

## 2011-05-23 NOTE — H&P (Signed)
NAME:  Melody Casey, Melody Casey NO.:  [id number]  MEDICAL RECORD NO.:  [id number]  LOCATION:  [facility]                         FACILITY:  [facility]  PHYSICIAN:  Talmage Nap, MD  DATE OF BIRTH:  12-Apr-1946  DATE OF ADMISSION:  05/17/2011 DATE OF DISCHARGE:                             HISTORY & PHYSICAL   PRIMARY CARE PHYSICIAN:  Unknown.  PRIMARY CARDIOLOGIST:  Rollene Rotunda, MD, [facility]  History obtainable from the patient.  CHIEF COMPLAINT:  Palpitation (my heart beating fast at about 10:30 p.m. on May 17, 2011.)  The patient is a 65 year old Caucasian female with history of coronary artery disease, status post cardiac cath plus stent and then prior non-ST elevated MI was brought to the emergency room by emergency medical service because the patient claimed that about 10:30 p.m. on May 17, 2011, while she was on her bed reading.  She felt her heart was beating very, very fast.  She then took her blood pressure and it was 177/100 and heart rate was 160.  She at this time denied any history of shortness of breath.  She denied any nausea.  She denied any vomiting.  She denied any fever.  She denied any chills.  She denied any rigor.  She also denied any shortness of breath.  She claims she was very anxious and subsequently called emergency medical service.  When EMS came to the patient's house, she was found to be in AFib and heart rate was well over 160.  She was initially given adenosine by EMS. Heart rate remained the same and thereafter she was given Cardizem and the heart rate came down to 84 beats per minute.  She claims she was very anxious which she denied any chest pain or shortness of breath. The only medication, she took was baby aspirin and subsequently brought to the emergency room.  In the emergency room, the patient's heart rate was fluctuating between over 150 beats per minute and sometimes drops to 50 and 35.  After evaluating the patient, she was advised  to be admitted for further evaluation by the cardiologist.  PAST MEDICAL HISTORY: 1. Positive for coronary artery disease. 2. Prior non-ST MI. 3. Hypertension. 4. Diabetes mellitus. 5. Anxiety disorder. 6. Arthritis. 7. Atrial fibrillation. 8. Hyperlipidemia. 9. History of diverticulitis.  PAST SURGICAL HISTORY: 1. Coronary artery disease, status post cardiac cath stent. 2. Upper endoscopy. 3. Left knee arthroscopy. 4. 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  PREADMISSION MEDICATIONS: 1. Prasugrel 10 mg 1 p.o. daily. 2. Niacin SR 500 mg 1 p.o. daily at bedtime. 3. Metformin 500 mg 1 p.o. daily at bedtime. 4. Lisinopril 10 mg 1 p.o. daily. 5. Lipitor (atorvastatin) 40 mg 1 p.o. daily at bedtime. 6. Ibuprofen 200 mg 1-2 tab p.o. q.6 p.r.n. 7. Metronidazole 500 mg 1 p.o. t.i.d. 8. Fish oil 1000 mg 1 p.o. daily. 9. Diltiazem 90 mg 1 p.o. daily. 10.Cipro (ciprofloxacin) 250 mg 1 p.o. b.i.d. 11.Aspirin 81 mg p.o. daily. 12.Alprazolam 0.25 mg 1 p.o. daily at bedtime p.r.n.  ALLERGIES:  ACETAMINOPHEN, SULFA, PREDNISONE, OXYCODONE/APAP.  SOCIAL HISTORY:  The patient smoked about a pack of cigarettes per day for over 50 years and  quit smoking about 3-4 months ago.  Denies any history of alcohol use and she is on disability.  FAMILY HISTORY:  York Spaniel to be positive for coronary artery disease.  REVIEW OF SYSTEMS:  The patient presently denies any history of headaches.  No blurred vision.  No nausea or vomiting.  Denies any palpitation now.  No chest pain.  No shortness of breath.  No fever.  No chills.  No rigor.  No abdominal discomfort.  No diarrhea or hematochezia.  No dysuria or hematuria.  No swelling of the lower extremity.  No intolerance to heat or cold and no known psychiatric disorder.  PHYSICAL EXAMINATION:  GENERAL:  Elderly lady not in any obvious respiratory distress, well-hydrated. VITAL SIGNS:  Blood pressure is 116/68, pulse 70, respiratory rate  18, temperature is 98.4. HEENT:  Pupils are reactive to light and extraocular muscles are intact. NECK:  No jugular venous distention.  No carotid bruit.  No lymphadenopathy. CHEST:  Clear to auscultation. CVS:  Heart sounds are irregularly irregular. ABDOMEN:  Soft and nontender.  Liver, spleen and kidney not palpable. Bowel sounds are positive. EXTREMITIES:  No pedal edema. NEUROLOGIC:  Nonfocal. MUSCULOSKELETAL SYSTEM:  Arthritic changes in the knees. NEUROPSYCHIATRIC EVALUATION:  Unremarkable. SKIN:  Normal turgor.  LABORATORY DATA:  Initial chemistry shows sodium of 138, potassium of 3.2, chloride of 107, bicarb of 20, glucose is 186, BUN is 12, creatinine is 0.60.  First set of cardiac markers troponin-I less than 0.30, CK-MB 2.2, magnesium level is 2.1.  Hematological indices showed WBC of 7.2, hemoglobin of 12.6, hematocrit 37.0, MCV of 86.9 with a platelet count of 292 with normal differential.  Chest x-ray is essentially normal.  Initial EKG done on the patient's showed atrial fibrillation with a rate of 65 beats per minute.  Another repeat EKG showed atrial fibrillation with a rate of 122 and a repeat atrial fibrillation with a rate of 99 and another EKG showed a atrial fibrillation with a rate of 37-40.  IMPRESSION: 1. Palpitation. 2. 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 3. Atrial fibrillation. 4. Hypokalemia. 5. Coronary artery disease, status post cardiac cath plus stent. 6. Prior non-ST elevated myocardial infarction. 7. Diabetes mellitus. 8. Hypertension. 9. Hyperlipidemia. 10.Anxiety disorder.  PLAN:  To admit the patient to Step-Down Unit.  The patient will be given aspirin 325 mg p.o. daily, prasugrel 10 mg p.o. daily, Xanax 0.25 mg p.o. b.i.d. p.r.n. for anxiety.  Other medication to be given to the patient will include lisinopril 10 mg p.o. daily for maintaining  blood pressure and then Lipitor 20 mg p.o. daily for hyperlipidemia.  Since the patient is hypokalemic, she will be on KCl 20 mEq p.o. b.i.d.  She will be placed on Accu-Cheks t.i.d. with a.c. at bedtime with regular insulin sliding scale (moderate scale.)  GI prophylaxis will be done on Protonix 40 mg p.o. daily and DVT prophylaxis with Lovenox 40 mg subcu q.24.  Further workup to be done on this patient will include cardiac enzymes q.6 x3, CBC, CMP and magnesium repeated in a.m., hemoglobin A1c will also be repeated in this admission and the patient's cardiologist, Dr. Antoine Poche will be notified this a.m.  The patient will be followed and evaluated on daily basis.     Talmage Nap, MD     CN/MEDQ  D:  05/18/2011  T:  05/18/2011  Job:  [id number]  Electronically Signed by Talmage Nap  on  05/23/2011 07:47:56 AM

## 2011-05-25 ENCOUNTER — Encounter (HOSPITAL_COMMUNITY): Payer: Medicare Other

## 2011-05-28 ENCOUNTER — Encounter (HOSPITAL_COMMUNITY): Payer: Medicare Other

## 2011-05-29 ENCOUNTER — Telehealth: Payer: Self-pay | Admitting: *Deleted

## 2011-05-29 NOTE — Telephone Encounter (Signed)
LMOM that her Tikosyn was in and will be out front for her to pick up

## 2011-05-30 ENCOUNTER — Encounter (HOSPITAL_COMMUNITY): Payer: Medicare Other

## 2011-05-30 NOTE — Discharge Summary (Signed)
NAMEMarland Kitchen  Melody Casey, Melody Casey NO.:  [id number]  MEDICAL RECORD NO.:  [id number]  LOCATION:  3715                         FACILITY:  [facility]  PHYSICIAN:  Rollene Rotunda, MD, FACCDATE OF BIRTH:  1946/07/26  DATE OF ADMISSION:  05/17/2011 DATE OF DISCHARGE:  05/21/2011                              DISCHARGE SUMMARY   PROCEDURES:  Two-view chest x-ray.  PRIMARY FINAL DISCHARGE DIAGNOSIS:  Paroxysmal atrial fibrillation with rapid ventricular response.  SECONDARY DIAGNOSES: 1. Non-ST-segment elevation myocardial infarction in March 2012 status     post percutaneous transluminal coronary angioplasty and drug-     eluting stent to the obtuse marginal-2 with residual coronary     artery disease treated medically. 2. Preserved left ventricular function by catheterization in March     2012. 3. Diabetes. 4. Hypertension. 5. Hyperlipidemia. 6. Anxiety. 7. History of diverticulitis in May 2012. 8. History of bradycardia. 9. 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 10.Allergy or intolerance to TYLENOL, PERCOCET, PENICILLIN,     PREDNISONE, and SULFA. 11.Remote history of tobacco use.  TIME OF DISCHARGE:  Forty one minutes.  HOSPITAL COURSE:  Melody Casey is a 65 year old female with a history of paroxysmal atrial fibrillation recently diagnosed.  She was admitted to the hospital with palpitations and was in AFib RVR.  She was symptomatic and there was concern for tachy-brady syndrome.  She has a history of preserved left ventricular function, so her potassium was supplemented, magnesium checked, and she was started on Tikosyn. Her potassium level remained stable and her magnesium was within normal limits at 2.2, and chest x-ray showed no acute disease.  She had not previously been on Coumadin and had simply been on aspirin and Plavix. This was continued, but because of the recurrent atrial  fibrillation she had Coumadin added to her medication regimen.  Her platelet reactivity test will be checked as an outpatient.  She is also to discuss an outpatient sleep study for obstructive sleep apnea.  On May 21, 2011, Melody Casey was maintaining sinus rhythm/sinus bradycardia.  Her QTc was 490 after Tikosyn.  She was evaluated by Dr. Antoine Poche and considered stable for discharge, to follow up as an outpatient.  DISCHARGE INSTRUCTIONS:  Her activity level is to be increased gradually.  She is encouraged to stick to a low-sodium diabetic diet. She is encouraged not to use tobacco.  She is to follow up with Dr. Alda Berthold office this week for Coumadin check and blood work.  She is to follow up with Dr. Antoine Poche in [place] on June 05, 2011, at 10:15 and after that in [place].  DISCHARGE MEDICATIONS: 1. Lisinopril 10 mg daily. 2. Tikosyn 500 mcg b.i.d. 3. Prasugrel is discontinued. 4. Fish oil 1000 mg daily. 5. Niacin 500 mg at bedtime. 6. Metronidazole is discontinued. 7. Alprazolam 0.25 mg at bedtime. 8. Metformin 500 mg at bedtime. 9. Diltiazem is discontinued. 10.Lipitor 40 mg at bedtime. 11.Aspirin 81 mg discontinued. 12.Aspirin 325 mg daily. 13.Ibuprofen 200 mg 1-2 tablets q.6 h. p.r.n. 14.Plavix 75 mg a day. 15.Cipro was discontinued. 16.Protonix 40 mg a day. 17.Coumadin 5  mg daily or as directed.     Theodore Demark, PA-C   ______________________________ Rollene Rotunda, MD, [facility]    RB/MEDQ  D:  05/21/2011  T:  05/22/2011  Job:  [id number]  cc:   Tammy R. Collins Scotland, M.D.  Electronically Signed by Theodore Demark PA-C on 05/24/2011 03:21:00 PM Electronically Signed by Rollene Rotunda MD [facility] on 05/30/2011 11:46:37 AM

## 2011-05-30 NOTE — H&P (Addendum)
NAME:  Melody Casey, Melody Casey NO.:  [id number]  MEDICAL RECORD NO.:  [id number]  LOCATION:                                 FACILITY:  PHYSICIAN:  Melody Rotunda, MD, FACCDATE OF BIRTH:  [birth year]  DATE OF ADMISSION: DATE OF DISCHARGE:                             HISTORY & PHYSICAL   PRIMARY CARDIOLOGIST:  Melody Rotunda, MD, [facility]  PRIMARY MEDICAL DOCTOR:  Melody R. Collins Scotland, MD  CHIEF COMPLAINT:  Palpitations.  HISTORY OF PRESENT ILLNESS:  Melody Casey is a 65 year old female with a history of CAD, diabetes mellitus, diverticulitis, bradycardia, and PAF who was admitted with palpitations.  She has been doing fairly well since April 2012.  She has kept diligent records of her health since then, and notes that she likely had AFib on Apr 12, 2011, May 4, Apr 23, 2011, as well as overnight last night which brought her into the hospital.  Around 9:45 in the evening while reading in bed, she developed tachy palpitations and was able to tell that she was in atrial fibrillation.  Her blood pressure was elevated and she took three baby aspirin with no relief of palpitations and therefore called 911.  She received diltiazem ER to help slow her down, but was still high this morning with heart rates in the 120s-130s.  She was started on diltiazem drip and is now less symptomatic.  Right before consultation, she converted to normal sinus rhythm with heart rates in the 60s to 70s. There is also documentation by EMS with bradycardia with heart rates down into the 30s-40s.  She denies any chest pain or shortness of breath.  We are were asked to see her regarding atrial fibrillation with RVR, and feel that she may be more appropriate to be transferred to Cardiology Service.  PAST MEDICAL HISTORY: 1. CAD.     a.     Status post NSTEMI in March 2012 secondary to occluded mid      circumflex.  She had PTCA/drug-eluting stent to the OM-2 with      residual LAD and RCA disease at that  time.     b.     Normal LV function by cath in March 2012. 2. PAF, diagnosed in April 2012.     a.     Per Melody Casey last office visit, not felt to be high      risk, so not initiated on Coumadin. 3. Diabetes mellitus. 4. Hypertension. 5. Hyperlipidemia. 6. Anxiety. 7. Diverticulitis, May 2012. 8. History of bradycardia.  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  Knee surgery.  Ureteral blockage status post stenting remotely.  OUTPATIENT MEDICATIONS: 1. Lisinopril 10 mg daily. 2. Effient 10 mg daily. 3. Fish oil. 4. Niacin 500 mg at bedtime 5. Flagyl 500 mg t.i.d. 6. Xanax. 7. Metformin 500 mg at bedtime. 8. Diltiazem 90 mg daily. 9. Lipitor 40 mg at bedtime. 10.Aspirin 81 mg daily. 11.Ibuprofen 200 mg q.6 hours. 12.Cipro 250 mg b.i.d.  INPATIENT MEDICATIONS: 1. Aspirin 325 mg daily. 2. Diltiazem drip. 3. Lovenox 40 mg daily. 4. Lisinopril 10 mg  daily. 5. Protonix 40 mg daily. 6. Potassium chloride 20 mEq b.i.d. 7. Effient 10 mg daily. 8. Zocor 40 mg at bedtime.  ALLERGIES:  TYLENOL, PERCOCET, PENICILLIN, PREDNISONE, and SULFA.  SOCIAL HISTORY:  The patient lives in [place].  She is married and helps take care of her husband who has laryngeal cancer.  They have two children.  She quit smoking approximately 5 months ago and previously smoked for 50 years.  She denies any alcohol use.  FAMILY HISTORY:  Mother died of CHF at age 21.  Father died of lung cancer at 36.  Siblings do not have cardiac issues.  REVIEW OF SYSTEMS:  No fevers, chills, melena, hematemesis, bright red blood per rectum.  No nausea, vomiting, or diarrhea.  All other systems reviewed and otherwise negative.  LABORATORY DATA:  WBC 7.2, hemoglobin 12.6, hematocrit 37, platelet count 292.  Sodium 138, potassium 3.2, chloride 107, CO2 20, glucose 186, BUN 12, creatinine 0.60.  Cardiac enzymes negative x2.  EKGs AFib with RVR  without acute changes.  RADIOLOGIC STUDIES:  X-ray with no active cardiopulmonary disease.  PHYSICAL EXAMINATION:  VITAL SIGNS:  Temperature 98.2, pulse 81, respirations 18, blood pressure 128/70, pulse ox 98% on room air. GENERAL:  This is a pleasant white female in no acute distress. HEENT:  Normocephalic and atraumatic with extraocular movements intact and clear sclerae.  Nares without discharge.  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 NECK:  Supple without carotid bruit. HEART:  Auscultation of the heart reveals regular rate and rhythm with S1 and S2 without murmurs, rubs, or gallops. LUNGS:  Clear to auscultation bilaterally without wheezes, rales, rhonchi. ABDOMEN:  Soft, nontender, nondistended with positive bowel sounds. EXTREMITIES:  Warm and dry without edema.  Her right lower extremity is smaller since childhood as compared to the left. NEUROLOGIC:  She is alert and oriented x3 and responds to questions appropriately with a normal affect.  ASSESSMENT AND PLAN:  The patient was seen and examined by Melody Casey and myself.  Given the cardiac nature of her issue at present, we will transfer her to the Cardiology Service.  She has a 65 year old female with a history of known coronary artery disease, diabetes, normal left ventricular function, and paroxysmal atrial fibrillation diagnosed initially in April 2012, who presents to [facility] with recurrent symptomatic paroxysmal atrial fibrillation complicated by tachycardia-bradycardia syndrome with documented bradycardia in the 30s. She is felt to be a good candidate for an antiarrhythmic.  Her potassium is on the lower sides.  We will supplement her with an additional 40 mEq KCl, totalling 120 and check a BMET and magnesium at 1 p.m.  After that time period, we will initiate Tikosyn.  Her QTc is sinus and is 442 msec.  Melody Casey would like to hold off on Coumadin for now pending discussion with Melody Casey, in regards  to making decision about triple anticoagulation therapy given that she is on aspirin and Effient.     Melody Casey, P.A.C.   ______________________________ Melody Rotunda, MD, [facility]    DD/MEDQ  D:  05/18/2011  T:  05/19/2011  Job:  [id number]  cc:   Melody Rotunda, MD, [facility] Melody Casey, M.D.  Electronically Signed by Melody Rotunda MD [facility] on 05/30/2011 11:46:42 AM Electronically Signed by Melody Casey  on 06/05/2011 02:13:27 PM

## 2011-06-01 ENCOUNTER — Encounter (HOSPITAL_COMMUNITY): Payer: Medicare Other

## 2011-06-04 ENCOUNTER — Encounter (HOSPITAL_COMMUNITY): Payer: Medicare Other

## 2011-06-05 ENCOUNTER — Ambulatory Visit (INDEPENDENT_AMBULATORY_CARE_PROVIDER_SITE_OTHER): Payer: Medicare Other | Admitting: Cardiology

## 2011-06-05 ENCOUNTER — Encounter: Payer: Self-pay | Admitting: Cardiology

## 2011-06-05 DIAGNOSIS — I10 Essential (primary) hypertension: Secondary | ICD-10-CM

## 2011-06-05 DIAGNOSIS — E785 Hyperlipidemia, unspecified: Secondary | ICD-10-CM

## 2011-06-05 DIAGNOSIS — I4891 Unspecified atrial fibrillation: Secondary | ICD-10-CM

## 2011-06-05 DIAGNOSIS — I2510 Atherosclerotic heart disease of native coronary artery without angina pectoris: Secondary | ICD-10-CM

## 2011-06-05 DIAGNOSIS — F17200 Nicotine dependence, unspecified, uncomplicated: Secondary | ICD-10-CM

## 2011-06-05 NOTE — Assessment & Plan Note (Signed)
She is maintaining sinus rhythm symptomatically but she still needs Coumadin. I will continue her antiarrhythmic.

## 2011-06-05 NOTE — Patient Instructions (Signed)
Continue current medications Follow up in 4 months with Dr Antoine Poche in the [place] office.  You will receive a letter in the mail 2 months before you are due.  Please call us when you receive this letter to schedule your follow up appointment.

## 2011-06-05 NOTE — Assessment & Plan Note (Signed)
She is still not smoking.

## 2011-06-05 NOTE — Assessment & Plan Note (Signed)
The patient has no new sypmtoms.  No further cardiovascular testing is indicated.  We will continue with aggressive risk reduction and meds as listed.  

## 2011-06-05 NOTE — Progress Notes (Signed)
HPI The patient presents for followup after a recent hospitalization for atrial fibrillation. At that time she was treated with Tikosyn.  I did switch her to Plavix as she was going to be on Coumadin aspirin and Effient.  There is less bleeding risk potentially in this situation with Plavix.   Her platelet reactivity test with Plavix was adequate. Since going home she has had no new symptoms. In particular she has had no palpitations, presyncope or syncope. She has had no chest pressure, neck or arm discomfort. She has had no weight gain or edema.  She is tolerating Coumadin followed by her primary provider. Of special note depression which he was experiencing in the hospital seems to have abated.    Allergies  Allergen Reactions  . Acetaminophen   . Oxycodone-Acetaminophen   . Penicillins   . Prednisone   . Sulfonamide Derivatives     Current Outpatient Prescriptions  Medication Sig Dispense Refill  . ALPRAZolam (XANAX) 0.25 MG tablet Take 0.25 mg by mouth at bedtime as needed.        Marland Kitchen aspirin 81 MG tablet Take 81 mg by mouth daily.        Marland Kitchen atorvastatin (LIPITOR) 40 MG tablet Take 40 mg by mouth daily.        . clopidogrel (PLAVIX) 75 MG tablet Take 75 mg by mouth daily.        Marland Kitchen dofetilide (TIKOSYN) 500 MCG capsule Take 500 mcg by mouth 2 (two) times daily.        . fish oil-omega-3 fatty acids 1000 MG capsule Take 1 g by mouth daily.        Marland Kitchen ibuprofen (ADVIL,MOTRIN) 200 MG tablet Take 200 mg by mouth every 6 (six) hours as needed.        Marland Kitchen lisinopril (PRINIVIL,ZESTRIL) 10 MG tablet Take 10 mg by mouth daily.        . metFORMIN (GLUMETZA) 500 MG (MOD) 24 hr tablet Take 500 mg by mouth daily with breakfast.        . niacin (NIASPAN) 500 MG CR tablet Take 500 mg by mouth at bedtime.        . nitroGLYCERIN (NITROSTAT) 0.4 MG SL tablet Place 0.4 mg under the tongue every 5 (five) minutes as needed.        . pantoprazole (PROTONIX) 40 MG tablet Take 40 mg by mouth daily.        . potassium  chloride SA (K-DUR,KLOR-CON) 20 MEQ tablet Take 20 mEq by mouth 2 (two) times daily.        Marland Kitchen warfarin (COUMADIN) 1 MG tablet Take 1 mg by mouth as directed.        Marland Kitchen DISCONTD: aspirin 81 MG tablet Take 243 mg by mouth daily.        Marland Kitchen DISCONTD: diltiazem (CARDIZEM) 90 MG tablet Take 90 mg by mouth daily.        Marland Kitchen DISCONTD: prasugrel (EFFIENT) 10 MG TABS Take 10 mg by mouth. 1 tab daily         Past Medical History  Diagnosis Date  . HLD (hyperlipidemia)   . HTN (hypertension)   . Arthritis   . Foot deformity     right  . CAD (coronary artery disease)     Circumflex occlusion February 14, 1999 medical treatment,  DES to OM2, LAD  40%, D1 30%  . Tobacco abuse   . Diabetes mellitus   . Atrial fibrillation     Past Surgical History  Procedure Date  . Tonsillectomy and adenoidectomy   . Knee arthroscopy     left  . Tubal ligation   . 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 Ureteral surgery     ROS:  As stated in the HPI and negative for all other systems.  PHYSICAL EXAM BP 147/77  Pulse 61  Resp 16  Ht 5\' 8"  (1.727 m)  Wt 172 lb (78.019 kg)  BMI 26.15 kg/m2GENERAL: Well appearing  HEENT: Pupils equal round and reactive, fundi not visualized, oral mucosa unremarkable  NECK: No jugular venous distention, waveform within normal limits, carotid upstroke brisk and symmetric, no bruits, no thyromegaly  LYMPHATICS: No cervical, inguinal adenopathy  LUNGS: Clear to auscultation bilaterally  BACK: No CVA tenderness  CHEST: Unremarkable  HEART: PMI not displaced or sustained,S1 and S2 within normal limits, no S3, no S4, no clicks, no rubs, no murmurs  ABD: Flat, positive bowel sounds normal in frequency in pitch, no bruits, no rebound, no guarding, no midline pulsatile mass, no hepatomegaly, no splenomegaly  EXT: 2 plus pulses throughout, no edema, no cyanosis no , 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  ASSESSMENT AND PLAN

## 2011-06-06 ENCOUNTER — Encounter (HOSPITAL_COMMUNITY): Payer: Medicare Other

## 2011-06-08 ENCOUNTER — Encounter (HOSPITAL_COMMUNITY): Payer: Medicare Other

## 2011-06-08 LAB — CBC WITH DIFFERENTIAL/PLATELET: platelet count: 183

## 2011-06-11 ENCOUNTER — Encounter (HOSPITAL_COMMUNITY): Payer: Medicare Other

## 2011-06-13 ENCOUNTER — Encounter (HOSPITAL_COMMUNITY): Payer: Medicare Other

## 2011-06-14 ENCOUNTER — Encounter: Payer: Self-pay | Admitting: Gastroenterology

## 2011-06-14 ENCOUNTER — Ambulatory Visit (INDEPENDENT_AMBULATORY_CARE_PROVIDER_SITE_OTHER): Payer: Medicare Other | Admitting: Gastroenterology

## 2011-06-14 VITALS — BP 133/71 | HR 91 | Temp 97.1°F | Ht 68.0 in | Wt 174.8 lb

## 2011-06-14 DIAGNOSIS — K625 Hemorrhage of anus and rectum: Secondary | ICD-10-CM | POA: Insufficient documentation

## 2011-06-14 DIAGNOSIS — Z79899 Other long term (current) drug therapy: Secondary | ICD-10-CM

## 2011-06-14 NOTE — Progress Notes (Signed)
Cc to PCP 

## 2011-06-14 NOTE — Progress Notes (Deleted)
Please contact Dr. Hochrein's office and find out his opinion. We need to do colonoscopy in near future for recent low-volume hematachezia. We can do procedure on Plavix and ASA. Does he recommend Lovenox bridge or holding Coumadin????   

## 2011-06-14 NOTE — Assessment & Plan Note (Signed)
Single episode of red blood in the stool and on the toilet tissue in the setting of INR of 3.6. No other bowel changes. No black stools. No other GI symptoms. Possibly hemorrhoidal bleeding. Patient has recent stent placement, on Plavix and aspirin as well as recently started Coumadin for atrial fibrillation. She would like to postpone colonoscopy given recent multiple hospitalizations and the fact that she would like to get her Coumadin dosage stabilized. I have explained her that we could do a colonoscopy on Plavix and aspirin the likely would need to hold the Coumadin and/or Lovenox bridge depending on what her cardiologist recommends. We will touch base with Dr. Antoine Poche. For now will hold on colonoscopy. However warning signs such as melena, moderate volume hematochezia have been fully explained to the patient. If she sees any of these she should go straight to the emergency department. Otherwise we'll try to regroup and do a colonoscopy in the next month or so when patient is ready.

## 2011-06-14 NOTE — Progress Notes (Signed)
Primary Care Physician:  Marily Memos, MD  Primary Gastroenterologist:  Jonette Eva, MD  Chief Complaint  Patient presents with  . GI Bleeding    not bleeding now    HPI:  Melody Casey is a 65 y.o. female here at the request of Dr. Herb Grays for further evaluation of rectal bleeding in the setting of Coumadin. The patient states the episode occurred last Friday morning. She had a BM, blood in the stool and on tissue. Red blood but not bright. Second stool okay. No rectal pain or diarrhea.she does have a history of diverticulosis and actually had acute diverticulitis in May of this year. Saw Dr. Yehuda Budd. She had a rectal exam is showed some reddish stool, heme positive but otherwise unremarkable. At that time her INR was 3.6. Stopped the Coumadin on Friday night and Saturday night, but resumed. Saw Dr. Yehuda Budd again on 06/11/11.  INR 1.3. Now on Coumadin 7.5mg  T, TH, S, S. 5mg  other days. Next INR on Monday. No history of hemorrhoids. No melena. No constipation or diarrhea. Diverticulitis in 04/2011, required hospitalization because could not tolerate the oral antibiotics. Took [facility] Miralax --> rash and swelling. Was also on oral Cipro and Flagyl but since tolerated IV Cipro and IV Flagyl without rash. Another episode of diverticulitis in 2008, treated as outpatient. Never had colonoscopy.   Denies constipation, diarrhea, abdominal pain, heartburn, weight loss, dysphagia. Labs from 06/08/2011 (the day she had bleeding) showed hemoglobin 13.8, hematocrit 43.3, MCV 88.8, platelet count 183,000.  Patient had non-ST segment elevation myocardial infarction in March of 2012. She had placement of drug-eluting stents at that time. She was placed on Plavix. She also has had atrial fibrillation with couple of hospitalizations. Recently put on Coumadin as well. INR has been difficult to keep therapeutic range and says she's had no further GI bleeding she requests to postpone colonoscopy until her Coumadin dose  stabilized.  Reviewed labs from multiple hospitalizations, recent outpatient labs. No evidence of significant anemia. No chronic anemia. Repeat CT from May 2012. She had acute uncomplicated diverticulitis, liver hemangioma   Current Outpatient Prescriptions  Medication Sig Dispense Refill  . ALPRAZolam (XANAX) 0.25 MG tablet Take 0.25 mg by mouth at bedtime as needed.        Marland Kitchen aspirin 81 MG tablet Take 81 mg by mouth daily.        Marland Kitchen atorvastatin (LIPITOR) 40 MG tablet Take 40 mg by mouth daily.        . clopidogrel (PLAVIX) 75 MG tablet Take 75 mg by mouth daily.        Marland Kitchen dofetilide (TIKOSYN) 500 MCG capsule Take 500 mcg by mouth 2 (two) times daily.        . fish oil-omega-3 fatty acids 1000 MG capsule Take 1 g by mouth daily.        Marland Kitchen ibuprofen (ADVIL,MOTRIN) 200 MG tablet Take 200 mg by mouth every 6 (six) hours as needed.       Marland Kitchen lisinopril (PRINIVIL,ZESTRIL) 10 MG tablet Take 10 mg by mouth daily.        . metFORMIN (GLUMETZA) 500 MG (MOD) 24 hr tablet Take 500 mg by mouth daily with breakfast.        . niacin (NIASPAN) 500 MG CR tablet Take 500 mg by mouth at bedtime.        . nitroGLYCERIN (NITROSTAT) 0.4 MG SL tablet Place 0.4 mg under the tongue every 5 (five) minutes as needed.        Marland Kitchen  pantoprazole (PROTONIX) 40 MG tablet Take 40 mg by mouth daily.        . potassium chloride SA (K-DUR,KLOR-CON) 20 MEQ tablet Take 20 mEq by mouth 2 (two) times daily.        Marland Kitchen warfarin (COUMADIN) 1 MG tablet Take 1 mg by mouth as directed. 1 1/2 pills Tuesday,Thursday, Saturday and Sunday         Allergies as of 06/14/2011 - Review Complete 06/14/2011  Allergen Reaction Noted  . Acetaminophen    . Oxycodone-acetaminophen    . Penicillins    . Prednisone    . Sulfonamide derivatives      Past Medical History  Diagnosis Date  . HLD (hyperlipidemia)   . HTN (hypertension)   . Arthritis   . Foot deformity     right  . CAD (coronary artery disease)     Circumflex occlusion February 14, 2011  medical treatment,  DES to OM2, LAD  40%, D1 30%  . Tobacco abuse   . Diabetes mellitus   . Atrial fibrillation   . Anxiety   . Diverticulitis of colon 2008 and 04/2011    Past Surgical History  Procedure Date  . Tonsillectomy and adenoidectomy   . Knee arthroscopy     left  . Tubal ligation   . Right leg surgery age 17    As a child for  ?scleroderma per patient  . Ureteral surgery     Family History  Problem Relation Age of Onset  . Colon cancer Neg Hx   . Liver disease Neg Hx   . Lung cancer Father 43    deceased  . Heart failure Mother 42    deceased    History   Social History  . Marital Status: Married    Spouse Name: N/A    Number of Children: 2  . 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 Smoking status: Former Smoker -- 1.0 packs/day for 50 years    Quit date: 02/08/2011  . Smokeless tobacco: Not on file  . Alcohol Use: No  . Drug Use: Not on file  . Sexually Active: Not on file   Other Topics Concern  . 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       ROS:  General: Negative for anorexia, weight loss, fever, chills, fatigue, weakness. Eyes: Negative for vision changes.  ENT: Negative for hoarseness, difficulty swallowing , nasal congestion. CV: Negative for chest pain, angina,dyspnea on exertion, peripheral edema. History of palpitations but none recently.  Respiratory: Negative for dyspnea at rest, dyspnea on exertion, cough, sputum, wheezing.  GI: See history of present illness. GU:  Negative for dysuria, hematuria, urinary incontinence, urinary frequency, nocturnal urination.  MS: Negative for joint pain, low back pain.  Derm: Negative for rash or itching.  Neuro: Negative for weakness, abnormal sensation, seizure, frequent headaches, memory loss, confusion.  Psych: Negative for anxiety, depression, suicidal ideation,  hallucinations.  Endo: Negative for unusual weight change.  Heme: Negative for bruising or bleeding. Allergy: Negative for rash or hives.    Physical Examination:  BP 133/71  Pulse 91  Temp(Src) 97.1 F (36.2 C) (Temporal)  Ht 5\' 8"  (1.727 m)  Wt 174 lb 12.8 oz (79.289 kg)  BMI 26.58 kg/m2   General: Well-nourished, well-developed in no acute distress.  Head: Normocephalic, atraumatic.   Eyes:  Conjunctiva pink, no icterus. Mouth: Oropharyngeal mucosa moist and pink , no lesions erythema or exudate. Neck: Supple without thyromegaly, masses, or lymphadenopathy.  Lungs: Clear to auscultation bilaterally.  Heart: Regular rate and rhythm, no murmurs rubs or gallops.  Abdomen: Bowel sounds are normal, nontender, nondistended, no hepatosplenomegaly or masses, no abdominal bruits or    hernia , no rebound or guarding.   Extremities: No lower extremity edema. Right lower extremity deformed.  Neuro: Alert and oriented x 4 , grossly normal neurologically.  Skin: Warm and dry, no rash or jaundice.   Psych: Alert and cooperative, normal mood and affect.

## 2011-06-14 NOTE — Progress Notes (Signed)
Please contact Dr. Jenene Slicker office and find out his opinion. We need to do colonoscopy in near future for recent low-volume hematachezia. We can do procedure on Plavix and ASA. Does he recommend Lovenox bridge or holding Coumadin????

## 2011-06-15 ENCOUNTER — Encounter (HOSPITAL_COMMUNITY)
Admission: RE | Admit: 2011-06-15 | Discharge: 2011-06-15 | Disposition: A | Discharge status: Home / Self Care | Payer: Medicare Other | Source: Ambulatory Visit | Attending: Cardiology | Admitting: Cardiology

## 2011-06-15 DIAGNOSIS — I2510 Atherosclerotic heart disease of native coronary artery without angina pectoris: Secondary | ICD-10-CM | POA: Insufficient documentation

## 2011-06-15 DIAGNOSIS — I4891 Unspecified atrial fibrillation: Secondary | ICD-10-CM | POA: Insufficient documentation

## 2011-06-15 DIAGNOSIS — Z5189 Encounter for other specified aftercare: Secondary | ICD-10-CM | POA: Insufficient documentation

## 2011-06-15 DIAGNOSIS — Z9861 Coronary angioplasty status: Secondary | ICD-10-CM | POA: Insufficient documentation

## 2011-06-15 NOTE — Progress Notes (Signed)
Called office number [facility] [address] [phone]  Message center. Retry on Mon.

## 2011-06-18 ENCOUNTER — Telehealth: Payer: Self-pay | Admitting: Cardiology

## 2011-06-18 ENCOUNTER — Encounter (HOSPITAL_COMMUNITY)
Admission: RE | Admit: 2011-06-18 | Discharge: 2011-06-18 | Disposition: A | Discharge status: Home / Self Care | Payer: Medicare Other | Source: Ambulatory Visit | Attending: Cardiology | Admitting: Cardiology

## 2011-06-18 NOTE — Telephone Encounter (Signed)
Pt need a colonoscopy. Has question regarding Lovenox bridge or holding coumadin.

## 2011-06-18 NOTE — Telephone Encounter (Signed)
Left message for Melody Casey to call back, we will need to check w/Dr Hochrein about pt holding couamdin

## 2011-06-18 NOTE — Progress Notes (Signed)
Called [phone] left message for the nurse to call me in reference to pt's meds for procedure.

## 2011-06-19 ENCOUNTER — Telehealth: Payer: Self-pay | Admitting: Physician Assistant

## 2011-06-19 NOTE — Telephone Encounter (Signed)
Pt called this evening to report she felt like she had gone into afib about an hour ago while watching TV. She took her BP, was 198/104 and HR was 74. She took a Xanax to help calm herself down which helped somewhat, but she is still quite concerned about her BP. Was recently increased on her lisinopril up by her PCP to 40mg  po daily in the form of 4 of her 10mg  tablets. Recheck of BP still in the 190's. Advised pt to take an additional 20mg  of lisinopril (2 10mg  tablets) and recheck BP in AM - if still elevated >150, to call PCP to discuss further changes. Also advised pt to proceed to ER if she notices any visual changes, weakness, severe headache, which she has none of right now. She expressed understanding and gratitude. She also was wondering if she should take even more baby aspirin than usual, advised against this as she is already on Plavix and Coumadin.

## 2011-06-19 NOTE — Telephone Encounter (Signed)
Per GI note - pt does need a colonoscopy and want to know if she needs to be bridged with Lovenox.  Will forward to Dr Antoine Poche for review and recommendations

## 2011-06-20 ENCOUNTER — Encounter (HOSPITAL_COMMUNITY)
Admission: RE | Admit: 2011-06-20 | Discharge: 2011-06-20 | Disposition: A | Discharge status: Home / Self Care | Payer: Medicare Other | Source: Ambulatory Visit | Attending: Cardiology | Admitting: Cardiology

## 2011-06-20 NOTE — Progress Notes (Signed)
Per Pam at Dr. Jenene Slicker office, OK for pt to hold coumadin for 4 days prior to colonoscopy. She does not need the Lovenox bridge.

## 2011-06-20 NOTE — Telephone Encounter (Signed)
Called and left a message back for [facility] of Dr Hochrein's orders.  Requested she call back if further questions.

## 2011-06-20 NOTE — Telephone Encounter (Signed)
The patient does not need to be bridged with Lovenox.

## 2011-06-21 NOTE — Progress Notes (Addendum)
Please schedule patient for f/u OV to schedule TCS (within in next 1-2 months). She wanted to wait for a month or so. Per Dr. Antoine Poche, coumadin can be held for procedure without Lovenox bridge.

## 2011-06-22 ENCOUNTER — Encounter (HOSPITAL_COMMUNITY)
Admission: RE | Admit: 2011-06-22 | Discharge: 2011-06-22 | Disposition: A | Discharge status: Home / Self Care | Payer: Medicare Other | Source: Ambulatory Visit | Attending: Cardiology | Admitting: Cardiology

## 2011-06-25 ENCOUNTER — Telehealth: Payer: Self-pay | Admitting: Cardiology

## 2011-06-25 ENCOUNTER — Encounter (HOSPITAL_COMMUNITY)
Admission: RE | Admit: 2011-06-25 | Discharge: 2011-06-25 | Disposition: A | Discharge status: Home / Self Care | Payer: Medicare Other | Source: Ambulatory Visit | Attending: Cardiology | Admitting: Cardiology

## 2011-06-25 NOTE — Telephone Encounter (Signed)
Pt states that her atorvastatin is costing [facility].  Pt wants to know if she can take Pravastatin instead.  Aware I will forward to Dr Antoine Poche for his review and orders.

## 2011-06-25 NOTE — Telephone Encounter (Signed)
Pt wants to know if she can change generic lipitor to pravastatin?, it would be cheaper

## 2011-06-27 ENCOUNTER — Encounter (HOSPITAL_COMMUNITY)
Admission: RE | Admit: 2011-06-27 | Discharge: 2011-06-27 | Disposition: A | Discharge status: Home / Self Care | Payer: Medicare Other | Source: Ambulatory Visit | Attending: Cardiology | Admitting: Cardiology

## 2011-06-29 ENCOUNTER — Encounter (HOSPITAL_COMMUNITY)
Admission: RE | Admit: 2011-06-29 | Discharge: 2011-06-29 | Disposition: A | Discharge status: Home / Self Care | Payer: Medicare Other | Source: Ambulatory Visit | Attending: Cardiology | Admitting: Cardiology

## 2011-07-02 ENCOUNTER — Encounter (HOSPITAL_COMMUNITY)
Admission: RE | Admit: 2011-07-02 | Discharge: 2011-07-02 | Disposition: A | Discharge status: Home / Self Care | Payer: Medicare Other | Source: Ambulatory Visit | Attending: Cardiology | Admitting: Cardiology

## 2011-07-04 ENCOUNTER — Encounter (HOSPITAL_COMMUNITY)
Admission: RE | Admit: 2011-07-04 | Discharge: 2011-07-04 | Disposition: A | Discharge status: Home / Self Care | Payer: Medicare Other | Source: Ambulatory Visit | Attending: Cardiology | Admitting: Cardiology

## 2011-07-05 NOTE — Progress Notes (Signed)
Agree w/ TCS ON PLAVIX AND ASA. HOLD COUMADIN

## 2011-07-06 ENCOUNTER — Encounter (HOSPITAL_COMMUNITY)
Admission: RE | Admit: 2011-07-06 | Discharge: 2011-07-06 | Disposition: A | Discharge status: Home / Self Care | Payer: Medicare Other | Source: Ambulatory Visit | Attending: Cardiology | Admitting: Cardiology

## 2011-07-09 ENCOUNTER — Encounter (HOSPITAL_COMMUNITY)
Admission: RE | Admit: 2011-07-09 | Discharge: 2011-07-09 | Disposition: A | Discharge status: Home / Self Care | Payer: Medicare Other | Source: Ambulatory Visit | Attending: Cardiology | Admitting: Cardiology

## 2011-07-09 NOTE — Progress Notes (Signed)
Reminder in epic for pt to follow up in 1-2 months to schedule tcs

## 2011-07-09 NOTE — Consult Note (Signed)
NAME:  Melody Casey, Melody Casey NO.:  [id number]  MEDICAL RECORD NO.:  [id number]           PATIENT TYPE:  I  LOCATION:  3314                         FACILITY:  [facility]  PHYSICIAN:  Colleen Can. Deborah Chalk, M.D.DATE OF BIRTH:  22-Jan-1946  DATE OF CONSULTATION:  05/02/2011 DATE OF DISCHARGE:                                CONSULTATION   PRIMARY CARE PHYSICIAN:  Tammy R. Collins Scotland, MD  CARDIOLOGIST:  Rollene Rotunda, MD, [facility] whom she saw on April 04, 2011.  CHIEF COMPLAINT:  Paroxysmal atrial fibrillation with rapid ventricular response.  HISTORY OF PRESENT ILLNESS:  Melody Casey is a 65 year old female with a history of coronary artery disease and PAF.  She was discharged from the hospital in sinus rhythm on March 25, 2011.  Since then, she has seen Dr. Antoine Poche and had one episode of an irregular heartbeat that was brief and resolved spontaneously.  Over the last 2 weeks, she has had left lower quadrant abdominal pain.  She saw her primary care physician and it was felt that she was developing diverticulitis, so she was given Cipro and tramadol.  She had trouble tolerating these medications because she had poor p.o. intake and the medicines then made her nauseated.  She developed constipation.  She was given MiraLax and asked to take it with Pedialyte.  She developed a significant rash and some edema last p.m. that has since resolved.  Last p.m., she also developed fever.  She had nausea and vomiting, which was worse this a.m.  She came to the emergency room and she had diverticulitis, but no abscess by CT. She was also in AFib with rapid ventricular response and Cardiology was asked to evaluate her.  She was started on IV Cardizem and has good heart rate control with the Cardizem at 5 mg per hour.  Melody Casey has good records of her heart rate and blood pressure and says that her home blood pressure machine will signify when her heart rate is irregular. Until last night when she  was very sick with nausea and vomiting, she feels that she would have known if her heart was out of rhythm, but she was not aware of her heart being out of rhythm when she came to the emergency room today.  On admission, her heart rate was in the 130s to the 150s.  Currently, she is resting more comfortably, but still complains of abdominal pain and nausea.  PAST MEDICAL HISTORY: 1. Non-ST segment elevation MI in March 2012, with PTCA and drug-     eluting stent to the OM-2, mid circumflex occluded and     collateral circulation seen, so treated medically. 2. Preserved left ventricular function with an EF that was normal by     cath in March 2012. 3. Diabetes. 4. Hypertension. 5. Dyslipidemia. 6. Anxiety. 7. Remote history of diverticulitis. 8. History of ureteral blockage, s/p stenting and its subsequent removal. 9. History of tobacco abuse. 10.Hypertension.  SURGICAL HISTORY:  She is status post ureteral stent and its removal, surgery to bilateral lower extremities, cardiac catheterization, and cystoscopy.ALLERGIES:  She is allergic or intolerant to  PREDNISONE, PERCOCET, SULFA, PENICILLIN and TYLENOL.  CURRENT MEDICATIONS: 1. Lisinopril 10 mg a day. 2. Prasugrel 10 mg a day. 3. Fish oil 1000 mg a day. 4. Niacin CR 500 mg nightly. 5. Alprazolam 0.25 mg nightly p.r.n. 6. Metformin 500 mg daily. 7. Diltiazem 90 mg daily. 8. Lipitor 440 mg nightly. 9. Sublingual nitroglycerin p.r.n. 10.Aspirin 81 mg a day. 11.Ibuprofen p.r.n.  SOCIAL HISTORY:  She lives in [place] with her husband.  She has approximately 50-pack-year history of tobacco use and quit about 2 months ago.  She has no history of alcohol or drug abuse.  FAMILY HISTORY:  Her mother died with heart failure at 18 and her father died with lung cancer at 69, but no siblings have cardiac issues.  REVIEW OF SYSTEMS:  She has abdominal pain.  She has had constipation. She has had nausea, vomiting and fevers.  She  has not had melena.  She has occasional palpitations.  With her recent illness, she has not had any chest pain or new shortness of breath.  Full 14-point review of systems is otherwise negative except as stated in the HPI.  PHYSICAL EXAMINATION:  VITAL SIGNS:  Temperature 99.1, blood pressure 110/56, heart rate 85, respiratory rate 20, O2 saturation 96% on room air. GENERAL:  She is a well-developed, middle-aged, white female in no acute distress at rest. HEENT:  Normal with the exception of poor dentition. NECK:  There is no lymphadenopathy, thyromegaly, bruit or JVD noted. CV:  Her heart is irregular in rate and rhythm with an S1-S2 and no significant murmur, rub, or gallop is noted.  Distal pulses are intact in all four extremities. LUNGS:  Clear to auscultation bilaterally. SKIN:  No rashes or lesions are noted. ABDOMEN:  Soft and she has tenderness that is most pronounced in the left lower quadrant.  Bowel sounds are present. EXTREMITIES:  There is no cyanosis, clubbing, or edema noted. MUSCULOSKELETAL:  The right lower extremity is chronically much smaller than the left.  There is a deformity of her right foot. NEUROLOGIC:  She is alert and oriented with cranial nerves II through XII grossly intact.  EKG; is AFib with RVR with a heart rate of 145 and no acute ischemic changes, although there are some inferolateral ST changes that are possibly secondary to heart rate.  LABORATORY VALUES:  Hemoglobin 11.6, hematocrit 32.9, WBCs 12.6, platelets 296.  Sodium 127, potassium 4.0, chloride 93, CO2 22, BUN 16, creatinine 0.71, glucose 157, albumin 2.8.  Urinalysis showing small amount of bilirubin, 15 ketones, moderate leukocytes, hyaline casts and granular casts as well as rare bacteria with the urine culture pending.  Chest x-ray; no acute findings.  CT of the abdomen and pelvis shows acute diverticulitis with pericolonic phlegmon and free fluid in the left lower quadrant, but  no discrete abscess or free air.  IMPRESSION:  [name] was seen today by Dr. Deborah Chalk, the patient evaluated and the data reviewed.  She has atrial fibrillation with a controlled ventricular rate right now.  She is on Cardizem IV at 5 mg per minute.  Her initial heart rate was significantly elevated, but she has been given IV fluids and medications for symptom and pain control as well as the Cardizem for rate control.  PLAN: 1. Her AFib has been paroxysmal. 2. We worry about full-dose heparin with diverticulitis and her     primary MD has ordered DVT Lovenox, which is appropriate. 3. She has a history of MI in March.  She  will be watched on telemetry     and we will recheck an EKG in a.m. 4. Her IV rehydration has already been ordered.  For now since her     nausea is significant, we will give her only clear liquids with     advanced to a diabetic diet as previously ordered. 5. Her blood sugar should be watched carefully.     Theodore Demark, PA-C   ______________________________ Colleen Can. Deborah Chalk, M.D.    RB/MEDQ  D:  05/02/2011  T:  05/03/2011  Job:  [id number]  Electronically Signed by Theodore Demark PA-C on 05/24/2011 03:20:10 PM Electronically Signed by Roger Shelter M.D. on 07/09/2011 11:15:28 AM

## 2011-07-11 ENCOUNTER — Encounter (HOSPITAL_COMMUNITY)
Admission: RE | Admit: 2011-07-11 | Discharge: 2011-07-11 | Disposition: A | Discharge status: Home / Self Care | Payer: Medicare Other | Source: Ambulatory Visit | Attending: Cardiology | Admitting: Cardiology

## 2011-07-11 DIAGNOSIS — I2510 Atherosclerotic heart disease of native coronary artery without angina pectoris: Secondary | ICD-10-CM | POA: Insufficient documentation

## 2011-07-11 DIAGNOSIS — Z5189 Encounter for other specified aftercare: Secondary | ICD-10-CM | POA: Insufficient documentation

## 2011-07-11 DIAGNOSIS — Z9861 Coronary angioplasty status: Secondary | ICD-10-CM | POA: Insufficient documentation

## 2011-07-11 DIAGNOSIS — I4891 Unspecified atrial fibrillation: Secondary | ICD-10-CM | POA: Insufficient documentation

## 2011-07-12 ENCOUNTER — Telehealth: Payer: Self-pay | Admitting: *Deleted

## 2011-07-12 NOTE — Telephone Encounter (Signed)
Left message for pt to call back to discuss her atorvastatin.  Per Dr Antoine Poche pt will not be able to reach goal with pravastatin as pt requested to be changed to due to cost.  I called her pharmacy you states he RX is $72.30 for #30 tablets.  If pt have private insurance she can use a $4 copay card for Lipitor (DAW).

## 2011-07-13 ENCOUNTER — Encounter (HOSPITAL_COMMUNITY)
Admission: RE | Admit: 2011-07-13 | Discharge: 2011-07-13 | Disposition: A | Discharge status: Home / Self Care | Payer: Medicare Other | Source: Ambulatory Visit | Attending: Cardiology | Admitting: Cardiology

## 2011-07-13 NOTE — Telephone Encounter (Signed)
Spoke with pt.  She doesn't have any insurance coverage to help with her RX at this time.  She did change to [facility] and her Lipitor was appr $50 less per month.  Pt was given the website for [email or web address]

## 2011-07-13 NOTE — Telephone Encounter (Signed)
Pt is returning your call

## 2011-07-16 ENCOUNTER — Encounter (HOSPITAL_COMMUNITY)
Admission: RE | Admit: 2011-07-16 | Discharge: 2011-07-16 | Disposition: A | Discharge status: Home / Self Care | Payer: Medicare Other | Source: Ambulatory Visit | Attending: Cardiology | Admitting: Cardiology

## 2011-07-18 ENCOUNTER — Encounter (HOSPITAL_COMMUNITY): Payer: Medicare Other

## 2011-07-20 ENCOUNTER — Encounter (HOSPITAL_COMMUNITY)
Admission: RE | Admit: 2011-07-20 | Discharge: 2011-07-20 | Disposition: A | Discharge status: Home / Self Care | Payer: Medicare Other | Source: Ambulatory Visit | Attending: Cardiology | Admitting: Cardiology

## 2011-07-23 ENCOUNTER — Encounter (HOSPITAL_COMMUNITY)
Admission: RE | Admit: 2011-07-23 | Discharge: 2011-07-23 | Disposition: A | Discharge status: Home / Self Care | Payer: Medicare Other | Source: Ambulatory Visit | Attending: Cardiology | Admitting: Cardiology

## 2011-07-25 ENCOUNTER — Encounter (HOSPITAL_COMMUNITY)
Admission: RE | Admit: 2011-07-25 | Discharge: 2011-07-25 | Disposition: A | Discharge status: Home / Self Care | Payer: Medicare Other | Source: Ambulatory Visit | Attending: Cardiology | Admitting: Cardiology

## 2011-07-27 ENCOUNTER — Encounter (HOSPITAL_COMMUNITY)
Admission: RE | Admit: 2011-07-27 | Discharge: 2011-07-27 | Disposition: A | Discharge status: Home / Self Care | Payer: Medicare Other | Source: Ambulatory Visit | Attending: Cardiology | Admitting: Cardiology

## 2011-07-30 ENCOUNTER — Encounter (HOSPITAL_COMMUNITY)
Admission: RE | Admit: 2011-07-30 | Discharge: 2011-07-30 | Disposition: A | Discharge status: Home / Self Care | Payer: Medicare Other | Source: Ambulatory Visit | Attending: Cardiology | Admitting: Cardiology

## 2011-08-01 ENCOUNTER — Encounter (HOSPITAL_COMMUNITY)
Admission: RE | Admit: 2011-08-01 | Discharge: 2011-08-01 | Disposition: A | Discharge status: Home / Self Care | Payer: Medicare Other | Source: Ambulatory Visit | Attending: Cardiology | Admitting: Cardiology

## 2011-08-03 ENCOUNTER — Encounter (HOSPITAL_COMMUNITY)
Admission: RE | Admit: 2011-08-03 | Discharge: 2011-08-03 | Disposition: A | Discharge status: Home / Self Care | Payer: Medicare Other | Source: Ambulatory Visit | Attending: Cardiology | Admitting: Cardiology

## 2011-08-06 ENCOUNTER — Encounter (HOSPITAL_COMMUNITY)
Admission: RE | Admit: 2011-08-06 | Discharge: 2011-08-06 | Disposition: A | Discharge status: Home / Self Care | Payer: Medicare Other | Source: Ambulatory Visit | Attending: Cardiology | Admitting: Cardiology

## 2011-08-08 ENCOUNTER — Encounter (HOSPITAL_COMMUNITY)
Admission: RE | Admit: 2011-08-08 | Discharge: 2011-08-08 | Disposition: A | Discharge status: Home / Self Care | Payer: Medicare Other | Source: Ambulatory Visit | Attending: Cardiology | Admitting: Cardiology

## 2011-08-10 ENCOUNTER — Telehealth: Payer: Self-pay | Admitting: Nurse Practitioner

## 2011-08-10 NOTE — Telephone Encounter (Signed)
Pt called this evening b/c she's been checking her bp and it's running 190's to 200's systolic.  She's already taken a xanax which has helped in the past.  I rec. That she take another Lisinopril 40mg  tonight and call on Tuesday to be seen in the office as she will very likely require an additional medication.  She says her bp runs 140's ususally.

## 2011-09-06 ENCOUNTER — Encounter: Payer: Self-pay | Admitting: Cardiology

## 2011-09-06 ENCOUNTER — Ambulatory Visit (INDEPENDENT_AMBULATORY_CARE_PROVIDER_SITE_OTHER): Payer: Medicare Other | Admitting: Cardiology

## 2011-09-06 DIAGNOSIS — F17200 Nicotine dependence, unspecified, uncomplicated: Secondary | ICD-10-CM

## 2011-09-06 DIAGNOSIS — I4891 Unspecified atrial fibrillation: Secondary | ICD-10-CM

## 2011-09-06 DIAGNOSIS — E785 Hyperlipidemia, unspecified: Secondary | ICD-10-CM

## 2011-09-06 DIAGNOSIS — I10 Essential (primary) hypertension: Secondary | ICD-10-CM

## 2011-09-06 DIAGNOSIS — Z720 Tobacco use: Secondary | ICD-10-CM

## 2011-09-06 MED ORDER — ATORVASTATIN CALCIUM 40 MG PO TABS: 60.0000 mg | ORAL_TABLET | Freq: Every day | ORAL | Status: DC

## 2011-09-06 MED ORDER — LISINOPRIL 10 MG PO TABS: 10.0000 mg | ORAL_TABLET | Freq: Every day | ORAL | Status: DC

## 2011-09-06 NOTE — Assessment & Plan Note (Signed)
She quit tobacco in March.

## 2011-09-06 NOTE — Assessment & Plan Note (Signed)
At this point I think that treating with prn 10 mg lisinopril is fine.  Otherwise, no change in therapy is indicated.

## 2011-09-06 NOTE — Assessment & Plan Note (Signed)
She tolerates the meds and has had no symptomatic recurrence.

## 2011-09-06 NOTE — Progress Notes (Signed)
HPI The patient presents for followup of HTN.  She has had some spiking BPs.  She reports that she has otherwise been doing well. She completed cardiac rehab.  She has had panic or depression.  The patient denies any new symptoms such as chest discomfort, neck or arm discomfort. There has been no new shortness of breath, PND or orthopnea. There have been no reported palpitations, presyncope or syncope.  She has been exercising.  She does report that her blood pressure has been spiking at times at night.  She does not feel anxious but she does feel like the BP is elevated.  She might take a Xanax and takes 10 mg of lisinopril.  Her BP will then come down.  Allergies  Allergen Reactions  . Acetaminophen   . Oxycodone-Acetaminophen   . Penicillins   . Prednisone   . Sulfonamide Derivatives   . Miralax (Polyethylene Glycol) Rash    [facility]ook [facility] brand developed rash. Patient states she tolerated name brand MiraLax in the past.    Current Outpatient Prescriptions  Medication Sig Dispense Refill  . ALPRAZolam (XANAX) 0.25 MG tablet Take 0.25 mg by mouth at bedtime as needed.        Marland Kitchen aspirin 81 MG tablet Take 81 mg by mouth daily.        Marland Kitchen atorvastatin (LIPITOR) 40 MG tablet Take 40 mg by mouth daily.        . clopidogrel (PLAVIX) 75 MG tablet Take 75 mg by mouth daily.        Marland Kitchen dofetilide (TIKOSYN) 500 MCG capsule Take 500 mcg by mouth 2 (two) times daily.        . fish oil-omega-3 fatty acids 1000 MG capsule Take 1 g by mouth daily.       Marland Kitchen ibuprofen (ADVIL,MOTRIN) 200 MG tablet Take 200 mg by mouth every 6 (six) hours as needed.       Marland Kitchen lisinopril (PRINIVIL,ZESTRIL) 40 MG tablet Take 40 mg by mouth daily.        . metFORMIN (GLUMETZA) 500 MG (MOD) 24 hr tablet Take 500 mg by mouth daily with breakfast.        . niacin (NIASPAN) 500 MG CR tablet Take 500 mg by mouth at bedtime.        . nitroGLYCERIN (NITROSTAT) 0.4 MG SL tablet Place 0.4 mg under the tongue every 5 (five) minutes as needed.          . pantoprazole (PROTONIX) 40 MG tablet Take 40 mg by mouth daily.        . potassium chloride SA (K-DUR,KLOR-CON) 20 MEQ tablet Take 20 mEq by mouth 2 (two) times daily.        Marland Kitchen warfarin (COUMADIN) 1 MG tablet Take 1 mg by mouth as directed. 1 1/2 pills Tuesday,Thursday, Saturday and Sunday         Past Medical History  Diagnosis Date  . HLD (hyperlipidemia)   . HTN (hypertension)   . Arthritis   . Foot deformity     right  . CAD (coronary artery disease)     Circumflex occlusion February 14, 2011 medical treatment,  DES to OM2, LAD  40%, D1 30%  . Tobacco abuse   . Diabetes mellitus   . Atrial fibrillation   . Anxiety   . Diverticulitis of colon 2008 and 04/2011    Past Surgical History  Procedure Date  . Tonsillectomy and adenoidectomy   . Knee arthroscopy     left  .  Tubal ligation   . Right leg surgery age 45    As a child for  ?scleroderma per patient  . Ureteral surgery     ROS:  As stated in the HPI and negative for all other systems.  PHYSICAL EXAM BP 130/66  Pulse 62  Resp 18  Ht 5\' 8"  (1.727 m)  Wt 177 lb 6.4 oz (80.468 kg)  BMI 26.97 kg/m2 GENERAL: Well appearing  HEENT: Pupils equal round and reactive, fundi not visualized, oral mucosa unremarkable, dentures NECK: No jugular venous distention, waveform within normal limits, carotid upstroke brisk and symmetric, no bruits, no thyromegaly  LYMPHATICS: No cervical, inguinal adenopathy  LUNGS: Clear to auscultation bilaterally  BACK: No CVA tenderness  CHEST: Unremarkable  HEART: PMI not displaced or sustained,S1 and S2 within normal limits, no S3, no S4, no clicks, no rubs, no murmurs  ABD: Flat, positive bowel sounds normal in frequency in pitch, no bruits, no rebound, no guarding, no midline pulsatile mass, no hepatomegaly, no splenomegaly  EXT: 2 plus pulses throughout, no edema, no cyanosis no , 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  ASSESSMENT AND PLAN

## 2011-09-06 NOTE — Patient Instructions (Signed)
Please start Lisinopril 10 mg at bedtime as needed Please increase your Lipitor to 40 mg 1 and 1/2 tablets daily Continue all other medications as listed Follow up with Dr Antoine Poche in 3 months

## 2011-09-06 NOTE — Assessment & Plan Note (Signed)
Her LDL was 107 and HDL was 35.  I have asked her to increase her Lipitor to 60 mg daily.  She can get a lipid and liver with SPEAR, TAMMY, MD in 8 weeks or so.

## 2011-09-11 ENCOUNTER — Ambulatory Visit: Payer: Medicare Other | Admitting: Cardiology

## 2011-10-17 NOTE — Progress Notes (Signed)
Cardiac Rehab Progress Report  Orientation:  04/12/2011 Graduate Date:  tbd Discharge Date:  tbd  Cardiologist:  Hochrein Family MD:  Dewain Penning Class Time:  09:30  A.  Exercise Program:  Tolerates exercise @ 2.1 METS for 15 minutes  B.  Mental Health:  Good mental attitude  C.  Education/Instruction/Skills  Knows THR for exercise and Uses Perceived Exertion Scale and/or Dyspnea Scale  D.  Nutrition/Weight Control/Body Composition:  Adherence to prescribed nutrition program: good   E.  Blood Lipids    Lab Results  Component Value Date   CHOL  Value: 181        ATP III CLASSIFICATION:  <200     mg/dL   Desirable  161-096  mg/dL   Borderline High  >=045    mg/dL   High        4/0/9811     Lab Results  Component Value Date   TRIG 130 02/14/2011     Lab Results  Component Value Date   HDL 29* 02/14/2011     Lab Results  Component Value Date   CHOLHDL 6.2 02/14/2011     Lab Results  Component Value Date   LDLDIRECT 158.6 04/05/2008      F.  Lifestyle Changes:  Making positive lifestyle changes  G.  Symptoms noted with exercise:  Asymptomatic  Report Completed By:  Angelica Pou  Comments:  Patient has done well her 1 st week. She achieved a peak mets of 2.1. Resting HR of 52 and resting BP of 138/68, Peak HR of 72 and Peak BP of 140/62. Another report will follow at her halfway point at 18 visits

## 2011-10-17 NOTE — Progress Notes (Signed)
Cardiac Rehab Progress Report  Orientation:  04/12/2011 Graduate Date:  08/08/11 Discharge Date:  08/08/2011  Cardiologist:  Hocherin Family MD:  Dewain Penning Class Time:  09:30  A.  Exercise Program:  Tolerates exercise @ 2.1 METS for 15 minutes and Discharged to home exercise program.  Anticipated compliance:  excellent  B.  Mental Health:  Good mental attitude  C.  Education/Instruction/Skills  Knows THR for exercise, Uses Perceived Exertion Scale and/or Dyspnea Scale and Attended all education classes  D.  Nutrition/Weight Control/Body Composition:  Adherence to prescribed nutrition program: good   E.  Blood Lipids    Lab Results  Component Value Date   CHOL  Value: 181        ATP III CLASSIFICATION:  <200     mg/dL   Desirable  161-096  mg/dL   Borderline High  >=045    mg/dL   High        4/0/9811     Lab Results  Component Value Date   TRIG 130 02/14/2011     Lab Results  Component Value Date   HDL 29* 02/14/2011     Lab Results  Component Value Date   CHOLHDL 6.2 02/14/2011     Lab Results  Component Value Date   LDLDIRECT 158.6 04/05/2008      F.  Lifestyle Changes:  Making positive lifestyle changes  G.  Symptoms noted with exercise:  Asymptomatic  Report Completed By:  Angelica Pou  Comments:  Mrs. Kerekes has progressed nicely to 30 minutes of aerobic exercise @Max  MET Level of 2.1 and 10 minutes of strength and flexibility exercises. All Patients vital signs are WNL. Patient has met with dietician. DC instructions have been reviewed in detail; Verbalized understanding. Patient plans to join a local Gym to continue her exercsing. Cardiac Rehab staff will contact patient at 1 month, 6 months, and 1 year. Patient had no C/O pain while in rehab.

## 2011-10-17 NOTE — Progress Notes (Signed)
Cardiac Rehab Progress Report  Orientation:  04/12/2011 Graduate Date:  tbd Discharge Date:  tbd  Cardiologist:  Hocherin Family MD:  Dewain Penning Class Time:  09:30  A.  Exercise Program:  Tolerates exercise @ 2.0 METS for 15 minutes  B.  Mental Health:  Good mental attitude  C.  Education/Instruction/Skills  Knows THR for exercise and Uses Perceived Exertion Scale and/or Dyspnea Scale  D.  Nutrition/Weight Control/Body Composition:  Adherence to prescribed nutrition program: good   E.  Blood Lipids    Lab Results  Component Value Date   CHOL  Value: 181        ATP III CLASSIFICATION:  <200     mg/dL   Desirable  161-096  mg/dL   Borderline High  >=045    mg/dL   High        4/0/9811     Lab Results  Component Value Date   TRIG 130 02/14/2011     Lab Results  Component Value Date   HDL 29* 02/14/2011     Lab Results  Component Value Date   CHOLHDL 6.2 02/14/2011     Lab Results  Component Value Date   LDLDIRECT 158.6 04/05/2008      F.  Lifestyle Changes:  Making positive lifestyle changes  G.  Symptoms noted with exercise:  Asymptomatic  Report Completed By:  Angelica Pou  Comments:  This is pt. halflway visit. She achieved a peak mets of 2.0 today. Her resting HR was 57 and resting BP was 130/80, Her Peak HR was 72 and peak BP was 162/72. She is motivated to continue to exercise

## 2011-11-23 ENCOUNTER — Telehealth: Payer: Self-pay | Admitting: Cardiology

## 2011-11-23 NOTE — Telephone Encounter (Signed)
New Msg: pt calling stating that she needs a refill of tikosyn called into [facility]. Pt ID # [id number]

## 2011-11-23 NOTE — Telephone Encounter (Signed)
Ordered refill - will be here in 7 to 10 business days.

## 2011-12-07 ENCOUNTER — Ambulatory Visit (INDEPENDENT_AMBULATORY_CARE_PROVIDER_SITE_OTHER): Payer: Medicare Other | Admitting: Cardiology

## 2011-12-07 ENCOUNTER — Encounter: Payer: Self-pay | Admitting: Cardiology

## 2011-12-07 VITALS — BP 150/80 | HR 62 | Ht 68.0 in | Wt 180.0 lb

## 2011-12-07 DIAGNOSIS — I4891 Unspecified atrial fibrillation: Secondary | ICD-10-CM

## 2011-12-07 DIAGNOSIS — F419 Anxiety disorder, unspecified: Secondary | ICD-10-CM | POA: Insufficient documentation

## 2011-12-07 DIAGNOSIS — F329 Major depressive disorder, single episode, unspecified: Secondary | ICD-10-CM

## 2011-12-07 NOTE — Progress Notes (Signed)
HPI The patient presents for followup of HTN. Since I last saw her she has had a couple episodes of her blood pressure spiking. She takes 10 of lisinopril or 20 and possibly a Xanax and her pressure will come down. She did see someone in her primary provider's office who suggested she restart beta blockers. However,  she didn't do this as she had had bradycardia with this in the past. She still very tearful with labile emotions. She was told to start an antidepressant in the past but she didn't follow through with this. The patient denies any new symptoms such as chest discomfort, neck or arm discomfort. There has been no new shortness of breath, PND or orthopnea. There have been no reported palpitations, presyncope or syncope.   Allergies  Allergen Reactions  . Acetaminophen   . Oxycodone-Acetaminophen   . Penicillins   . Prednisone   . Sulfonamide Derivatives   . Miralax (Polyethylene Glycol) Rash    Took [facility] brand developed rash. Patient states she tolerated name brand MiraLax in the past.    Current Outpatient Prescriptions  Medication Sig Dispense Refill  . ALPRAZolam (XANAX) 0.25 MG tablet Take 0.25 mg by mouth at bedtime as needed.        Marland Kitchen aspirin 81 MG tablet Take 81 mg by mouth daily.        Marland Kitchen atorvastatin (LIPITOR) 40 MG tablet Take 1.5 tablets (60 mg total) by mouth daily.  45 tablet  11  . clopidogrel (PLAVIX) 75 MG tablet Take 75 mg by mouth daily.        Marland Kitchen dofetilide (TIKOSYN) 500 MCG capsule Take 500 mcg by mouth 2 (two) times daily.        . fish oil-omega-3 fatty acids 1000 MG capsule Take 1 g by mouth daily.       Marland Kitchen ibuprofen (ADVIL,MOTRIN) 200 MG tablet Take 200 mg by mouth every 6 (six) hours as needed.       Marland Kitchen lisinopril (PRINIVIL,ZESTRIL) 10 MG tablet Take 40 mg by mouth daily.        Marland Kitchen lisinopril (PRINIVIL,ZESTRIL) 40 MG tablet Take 40 mg by mouth daily.        . metFORMIN (GLUMETZA) 500 MG (MOD) 24 hr tablet Take 500 mg by mouth daily with breakfast.        .  niacin (NIASPAN) 500 MG CR tablet Take 500 mg by mouth at bedtime.        . nitroGLYCERIN (NITROSTAT) 0.4 MG SL tablet Place 0.4 mg under the tongue every 5 (five) minutes as needed.        . pantoprazole (PROTONIX) 40 MG tablet Take 40 mg by mouth daily.        . potassium chloride SA (K-DUR,KLOR-CON) 20 MEQ tablet Take 20 mEq by mouth 2 (two) times daily.        Marland Kitchen warfarin (COUMADIN) 1 MG tablet Take 1 mg by mouth as directed. 1 1/2 pills Tuesday,Thursday, Saturday and Sunday         Past Medical History  Diagnosis Date  . HLD (hyperlipidemia)   . HTN (hypertension)   . Arthritis   . Foot deformity     right  . CAD (coronary artery disease)     Circumflex occlusion February 14, 2011 medical treatment,  DES to OM2, LAD  40%, D1 30%  . Tobacco abuse   . Diabetes mellitus   . Atrial fibrillation   . Anxiety   . Diverticulitis of colon 2008  and 04/2011    Past Surgical History  Procedure Date  . Tonsillectomy and adenoidectomy   . Knee arthroscopy     left  . Tubal ligation   . 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 Ureteral surgery     ROS:  As stated in the HPI and negative for all other systems.  PHYSICAL EXAM BP 150/80  Pulse 62  Ht 5\' 8"  (1.727 m)  Wt 180 lb (81.647 kg)  BMI 27.37 kg/m2 GENERAL: Well appearing  HEENT: Pupils equal round and reactive, fundi not visualized, oral mucosa unremarkable, dentures NECK: No jugular venous distention, waveform within normal limits, carotid upstroke brisk and symmetric, no bruits, no thyromegaly  LYMPHATICS: No cervical, inguinal adenopathy  LUNGS: Clear to auscultation bilaterally  BACK: No CVA tenderness  CHEST: Unremarkable  HEART: PMI not displaced or sustained,S1 and S2 within normal limits, no S3, no S4, no clicks, no rubs, no murmurs  ABD: Flat, positive bowel sounds normal in frequency in pitch, no bruits, no rebound, no guarding, no midline pulsatile mass, no hepatomegaly, no splenomegaly   EXT: 2 plus pulses throughout, no edema, no cyanosis no , 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 12/07/2011   ASSESSMENT AND PLAN

## 2011-12-07 NOTE — Assessment & Plan Note (Signed)
The patient has no new sypmtoms.  No further cardiovascular testing is indicated.  We will continue with aggressive risk reduction and meds as listed.  

## 2011-12-07 NOTE — Assessment & Plan Note (Signed)
I think this is a significant problem and I have suggested that she follow through with the prescribed treatment of antidepressants and followup with Herb Grays, MD, MD

## 2011-12-07 NOTE — Assessment & Plan Note (Signed)
She is maintaining sinus rhythm on Tikosyn and she will continue with this.

## 2011-12-07 NOTE — Assessment & Plan Note (Signed)
Her blood pressures are labile but overall well controlled.  She will remain on the meds as listed.

## 2011-12-07 NOTE — Patient Instructions (Signed)
Follow up with Dr Hochrein in 6 months The current medical regimen is effective;  continue present plan and medications.  

## 2012-01-08 ENCOUNTER — Telehealth: Payer: Self-pay | Admitting: Cardiology

## 2012-01-08 NOTE — Telephone Encounter (Signed)
New Msg: pt calling stating that she needs our office to call [facility] [phone]) for per-auth for pt renewal. Please return pt call to discuss further.

## 2012-01-08 NOTE — Telephone Encounter (Signed)
ID # [id number]*40 / [facility] [address] [employer] Part D Value plus. Needs prior auth for # 45 lipitor 60 mg a day.

## 2012-01-09 NOTE — Telephone Encounter (Signed)
Quantity exception given thru the end of the year per Pharmacist at [facility].  Pt aware

## 2012-02-21 ENCOUNTER — Telehealth: Payer: Self-pay | Admitting: Cardiology

## 2012-02-21 NOTE — Telephone Encounter (Signed)
Pt needs tikyson reordered, ID # [id number]

## 2012-02-22 NOTE — Telephone Encounter (Signed)
Refill requested order # [id number]  Should come in the next 7 to 10 days.

## 2012-05-02 ENCOUNTER — Emergency Department (HOSPITAL_COMMUNITY)
Admission: EM | Admit: 2012-05-02 | Discharge: 2012-05-02 | Disposition: A | Discharge status: Home / Self Care | Payer: Medicare Other | Attending: Emergency Medicine | Admitting: Emergency Medicine

## 2012-05-02 ENCOUNTER — Emergency Department (HOSPITAL_COMMUNITY): Payer: Medicare Other

## 2012-05-02 ENCOUNTER — Encounter (HOSPITAL_COMMUNITY): Payer: Self-pay | Admitting: *Deleted

## 2012-05-02 DIAGNOSIS — S5010XA Contusion of unspecified forearm, initial encounter: Secondary | ICD-10-CM | POA: Insufficient documentation

## 2012-05-02 DIAGNOSIS — S50811A Abrasion of right forearm, initial encounter: Secondary | ICD-10-CM

## 2012-05-02 DIAGNOSIS — Z7901 Long term (current) use of anticoagulants: Secondary | ICD-10-CM | POA: Insufficient documentation

## 2012-05-02 DIAGNOSIS — Z87891 Personal history of nicotine dependence: Secondary | ICD-10-CM | POA: Insufficient documentation

## 2012-05-02 DIAGNOSIS — S5011XA Contusion of right forearm, initial encounter: Secondary | ICD-10-CM

## 2012-05-02 DIAGNOSIS — I4891 Unspecified atrial fibrillation: Secondary | ICD-10-CM | POA: Insufficient documentation

## 2012-05-02 DIAGNOSIS — I2510 Atherosclerotic heart disease of native coronary artery without angina pectoris: Secondary | ICD-10-CM | POA: Insufficient documentation

## 2012-05-02 DIAGNOSIS — Z7982 Long term (current) use of aspirin: Secondary | ICD-10-CM | POA: Insufficient documentation

## 2012-05-02 DIAGNOSIS — Z79899 Other long term (current) drug therapy: Secondary | ICD-10-CM | POA: Insufficient documentation

## 2012-05-02 DIAGNOSIS — Y93K1 Activity, walking an animal: Secondary | ICD-10-CM | POA: Insufficient documentation

## 2012-05-02 DIAGNOSIS — E119 Type 2 diabetes mellitus without complications: Secondary | ICD-10-CM | POA: Insufficient documentation

## 2012-05-02 DIAGNOSIS — E785 Hyperlipidemia, unspecified: Secondary | ICD-10-CM | POA: Insufficient documentation

## 2012-05-02 DIAGNOSIS — I10 Essential (primary) hypertension: Secondary | ICD-10-CM | POA: Insufficient documentation

## 2012-05-02 DIAGNOSIS — IMO0002 Reserved for concepts with insufficient information to code with codable children: Secondary | ICD-10-CM | POA: Insufficient documentation

## 2012-05-02 DIAGNOSIS — Y998 Other external cause status: Secondary | ICD-10-CM | POA: Insufficient documentation

## 2012-05-02 MED ORDER — "THROMBI-PAD 3""X3"" EX PADS"
1.0000 | MEDICATED_PAD | Freq: Once | CUTANEOUS | Status: AC
  Administered 2012-05-02: 1 via TOPICAL
  Filled 2012-05-02: qty 1

## 2012-05-02 MED ORDER — TETANUS-DIPHTH-ACELL PERTUSSIS 5-2.5-18.5 LF-MCG/0.5 IM SUSP
0.5000 mL | Freq: Once | INTRAMUSCULAR | Status: AC
  Administered 2012-05-02: 0.5 mL via INTRAMUSCULAR
  Filled 2012-05-02: qty 0.5

## 2012-05-02 NOTE — ED Provider Notes (Signed)
History     CSN: [id number]  Arrival date & time 05/02/12  1748   First MD Initiated Contact with Patient 05/02/12 1817      Chief Complaint  Patient presents with  . Laceration    (Consider location/radiation/quality/duration/timing/severity/associated sxs/prior treatment) Patient is a 66 y.o. female presenting with skin laceration. The history is provided by the patient.  Laceration   She was walking her dog when it started to pull on the leash and her right arm banged against a wooden post causing an abrasion. She is on warfarin for atrial fibrillation and has not been able to get the bleeding stopped. There's also been moderate swelling of the arm. She is not on our last tetanus immunization was. Pain is mild and she rates it at 2/10.  Past Medical History  Diagnosis Date  . HLD (hyperlipidemia)   . HTN (hypertension)   . Arthritis   . Foot deformity     right  . CAD (coronary artery disease)     Circumflex occlusion February 14, 2011 medical treatment,  DES to OM2, LAD  40%, D1 30%  . Tobacco abuse   . Diabetes mellitus   . Atrial fibrillation   . Anxiety   . Diverticulitis of colon 2008 and 04/2011    Past Surgical History  Procedure Date  . Tonsillectomy and adenoidectomy   . Knee arthroscopy     left  . Tubal ligation   . Right leg surgery age 1    As a child for  ?scleroderma per patient  . Ureteral surgery     Family History  Problem Relation Age of Onset  . Colon cancer Neg Hx   . Liver disease Neg Hx   . Lung cancer Father 72    deceased  . Heart failure Mother 28    deceased    History  Substance Use Topics  . Smoking status: Former Smoker -- 1.0 packs/day for 50 years    Quit date: 02/08/2011  . Smokeless tobacco: Not on file  . Alcohol Use: No    OB History    Grav Para Term Preterm Abortions TAB SAB Ect Mult Living                  Review of Systems  All other systems reviewed and are negative.    Allergies  Acetaminophen;  Oxycodone-acetaminophen; Penicillins; Prednisone; Sulfonamide derivatives; and Miralax  Home Medications   Current Outpatient Rx  Name Route Sig Dispense Refill  . ALPRAZOLAM 0.25 MG PO TABS Oral Take 0.25 mg by mouth at bedtime as needed.      . ASPIRIN 81 MG PO TABS Oral Take 81 mg by mouth daily.      . ATORVASTATIN CALCIUM 40 MG PO TABS Oral Take 1.5 tablets (60 mg total) by mouth daily. 45 tablet 11  . CLOPIDOGREL BISULFATE 75 MG PO TABS Oral Take 75 mg by mouth daily.      . DOFETILIDE 500 MCG PO CAPS Oral Take 500 mcg by mouth 2 (two) times daily.      . OMEGA-3 FATTY ACIDS 1000 MG PO CAPS Oral Take 1 g by mouth daily.     . IBUPROFEN 200 MG PO TABS Oral Take 200 mg by mouth every 6 (six) hours as needed.     Marland Kitchen LISINOPRIL 10 MG PO TABS Oral Take 40 mg by mouth daily.      Marland Kitchen LISINOPRIL 40 MG PO TABS Oral Take 40 mg by mouth daily.      Marland Kitchen  METFORMIN HCL ER (MOD) 500 MG PO TB24 Oral Take 500 mg by mouth daily with breakfast.      . NIACIN ER (ANTIHYPERLIPIDEMIC) 500 MG PO TBCR Oral Take 500 mg by mouth at bedtime.      Marland Kitchen NITROGLYCERIN 0.4 MG SL SUBL Sublingual Place 0.4 mg under the tongue every 5 (five) minutes as needed.      Marland Kitchen PANTOPRAZOLE SODIUM 40 MG PO TBEC Oral Take 40 mg by mouth daily.      Marland Kitchen POTASSIUM CHLORIDE CRYS ER 20 MEQ PO TBCR Oral Take 20 mEq by mouth 2 (two) times daily.      . WARFARIN SODIUM 1 MG PO TABS Oral Take 1 mg by mouth as directed. 1 1/2 pills Tuesday,Thursday, Saturday and Sunday       BP 166/76  Pulse 56  Temp(Src) 98.2 F (36.8 C) (Oral)  Resp 16  Ht 5\' 8"  (1.727 m)  Wt 175 lb (79.379 kg)  BMI 26.61 kg/m2  SpO2 98%  Physical Exam  Nursing note and vitals reviewed.  66 year old female is resting currently in no acute distress. Vital signs are significant for mild bradycardia with heart rate 56, and mild hypertension with blood pressure 166/76. Oxygen saturation is 98% which is normal. Head is normocephalic and atraumatic. PERRLA, EOMI. Oropharynx  is clear. Neck is nontender and supple. Back is nontender. Lungs are clear without rales, wheezes, rhonchi. Heart has a regular of but without murmur. Abdomen is soft, flat, nontender without masses or hepatosplenomegaly. Extremities: There is soft tissue swelling of the distal right forearm with areas of epidermal skin tear and epidermal avulsion. There is continued slight oozing of blood from the injured area. There is no discrete laceration. Distal neurovascular exam is intact with prompt capillary refill and normal sensation. Skin is warm and dry without other rash. Neurologic: Mental status is normal, cranial nerves are intact, there are no focal motor or sensory deficits.  ED Course  Procedures (including critical care time)  Dg Forearm Right  05/02/2012  *RADIOLOGY REPORT*  Clinical Data: Right forearm laceration  RIGHT FOREARM - 2 VIEW  Comparison: None.  Findings: No fracture or dislocation is seen.  The joint spaces are preserved.  Soft tissue swelling/hematoma overlying the dorsal aspect of the wrist/distal forearm.  7 mm irregular radiopaque density along the medial/ulnar aspect of the proximal ulnar shaft, likely chronic.  IMPRESSION: No fracture or dislocation is seen.  Soft tissue swelling/hematoma overlying the dorsal aspect of the wrist/distal forearm.  7 mm irregular radiopaque density along the medial/ulnar aspect of the proximal ulnar shaft, likely chronic.  Correlate for additional site of injury to exclude radiopaque foreign body.  Original Report Authenticated By: Charline Bills, M.D.     1. Abrasion of forearm, right   2. Contusion of forearm, right       MDM  Epidermal tear and abrasion with persistent bleeding to 2 anticoagulation. X-rays obtained to rule out fracture. No fractures present so a dressing is applied with Thrombi-Pad and she is observed in the emergency department to make sure that bleeding is controlled.  She was observed for 30 minutes no bleeding and is  sent home with the dressing left in place.      Dione Booze, MD 05/02/12 2013

## 2012-05-02 NOTE — Discharge Instructions (Signed)
Keep the dressing on until tomorrow morning. Then remove it very carefully and continue to keep a non-adherent dressing on it. This swelling will take several weeks to go down. It is okay to take acetaminophen for pain.  Contusion A contusion is a deep bruise. Contusions are the result of an injury that caused bleeding under the skin. The contusion may turn blue, purple, or yellow. Minor injuries will give you a painless contusion, but more severe contusions may stay painful and swollen for a few weeks.  CAUSES  A contusion is usually caused by a blow, trauma, or direct force to an area of the body. SYMPTOMS   Swelling and redness of the injured area.   Bruising of the injured area.   Tenderness and soreness of the injured area.   Pain.  DIAGNOSIS  The diagnosis can be made by taking a history and physical exam. An X-ray, CT scan, or MRI may be needed to determine if there were any associated injuries, such as fractures. TREATMENT  Specific treatment will depend on what area of the body was injured. In general, the best treatment for a contusion is resting, icing, elevating, and applying cold compresses to the injured area. Over-the-counter medicines may also be recommended for pain control. Ask your caregiver what the best treatment is for your contusion. HOME CARE INSTRUCTIONS   Put ice on the injured area.   Put ice in a plastic bag.   Place a towel between your skin and the bag.   Leave the ice on for 15 to 20 minutes, 3 to 4 times a day.   Only take over-the-counter or prescription medicines for pain, discomfort, or fever as directed by your caregiver. Your caregiver may recommend avoiding anti-inflammatory medicines (aspirin, ibuprofen, and naproxen) for 48 hours because these medicines may increase bruising.   Rest the injured area.   If possible, elevate the injured area to reduce swelling.  SEEK IMMEDIATE MEDICAL CARE IF:   You have increased bruising or swelling.   You  have pain that is getting worse.   Your swelling or pain is not relieved with medicines.  MAKE SURE YOU:   Understand these instructions.   Will watch your condition.   Will get help right away if you are not doing well or get worse.  Document Released: 09/05/2005 Document Revised: 11/15/2011 Document Reviewed: 10/01/2011 [facility] Patient Information 2012 [place], [place].  Abrasions Abrasions are skin scrapes. Their treatment depends on how large and deep the abrasion is. Abrasions do not extend through all layers of the skin. A cut or lesion through all skin layers is called a laceration. HOME CARE INSTRUCTIONS   If you were given a dressing, change it at least once a day or as instructed by your caregiver. If the bandage sticks, soak it off with a solution of water or hydrogen peroxide.   Twice a day, wash the area with soap and water to remove all the cream/ointment. You may do this in a sink, under a tub faucet, or in a shower. Rinse off the soap and pat dry with a clean towel. Look for signs of infection (see below).   Reapply cream/ointment according to your caregiver's instruction. This will help prevent infection and keep the bandage from sticking. Telfa or gauze over the wound and under the dressing or wrap will also help keep the bandage from sticking.   If the bandage becomes wet, dirty, or develops a foul smell, change it as soon as possible.  Only take over-the-counter or prescription medicines for pain, discomfort, or fever as directed by your caregiver.  SEEK IMMEDIATE MEDICAL CARE IF:   Increasing pain in the wound.   Signs of infection develop: redness, swelling, surrounding area is tender to touch, or pus coming from the wound.   You have a fever.   Any foul smell coming from the wound or dressing.  Most skin wounds heal within ten days. Facial wounds heal faster. However, an infection may occur despite proper treatment. You should have the wound checked for  signs of infection within 24 to 48 hours or sooner if problems arise. If you were not given a wound-check appointment, look closely at the wound yourself on the second day for early signs of infection listed above. MAKE SURE YOU:   Understand these instructions.   Will watch your condition.   Will get help right away if you are not doing well or get worse.  Document Released: 09/05/2005 Document Revised: 11/15/2011 Document Reviewed: 10/30/2011 [facility] Patient Information 2012 [place], [place].

## 2012-05-02 NOTE — ED Notes (Signed)
Laceration to right forearm from hitting it on wooden post.  Bleeding controlled at this time with pressure dressing.  Pt takes coumadin.

## 2012-05-02 NOTE — ED Notes (Addendum)
R arm laceration bruising noted; forearm swollen

## 2012-05-21 ENCOUNTER — Telehealth: Payer: Self-pay | Admitting: Cardiology

## 2012-05-21 NOTE — Telephone Encounter (Signed)
Order number [id number]  Pt aware.

## 2012-05-21 NOTE — Telephone Encounter (Signed)
Pt needs reorder of tikosyn before tomorrow so she can get 3 months worth with [facility] , id # [id number] and [facility] # [phone], pt would a call when redorder, ok to leave message [phone]

## 2012-05-27 ENCOUNTER — Telehealth: Payer: Self-pay | Admitting: *Deleted

## 2012-05-27 NOTE — Telephone Encounter (Signed)
Left message for pt that Tikosyn samples have come in from [facility] and will be at front desk for pick up.  [id number] Lot number 03/09/2014 exp date #180.

## 2012-06-13 ENCOUNTER — Telehealth: Payer: Self-pay | Admitting: Cardiology

## 2012-06-13 NOTE — Telephone Encounter (Signed)
Walk In pt form "Pt Dropped off [employer] application" needs to be Completed Placed in Hochrein Doc Box  06/13/12/KM

## 2012-06-13 NOTE — Telephone Encounter (Signed)
Hochrein signed PFIZER form, pt called for Pick Up 06/13/12/KM

## 2012-07-04 ENCOUNTER — Telehealth: Payer: Self-pay | Admitting: Cardiology

## 2012-07-04 NOTE — Telephone Encounter (Signed)
Please return call to patient at [phone] regarding irregular heart beat.

## 2012-07-04 NOTE — Telephone Encounter (Signed)
Pt bp/p erratic, took tikosyn at 8:45 am, afib started at 9 am. Denies SOB/ no dizziness, just feeling anxious. bp 165/95 p 103 at 9 am, throughout morning pressures taken as follows; 145/100 p 107, 150/97 p 95, and 116/99 p 106. Pt has taken xanax this morning and will take another but otherwise feeling ok. Reviewed chart with DOD Dr [place] and pt is tolerating currently, pt to rest and not perform exertional tasks and is to call back next week if afib continues or go into er if condition changes. I did set her for app with dr hochrein for regular f/u end of august but to call if needs to be seen sooner, pt agreed to plan. Will rout to DR/Nurse.

## 2012-08-07 ENCOUNTER — Encounter: Payer: Self-pay | Admitting: Cardiology

## 2012-08-07 ENCOUNTER — Ambulatory Visit (INDEPENDENT_AMBULATORY_CARE_PROVIDER_SITE_OTHER): Payer: Medicare Other | Admitting: Cardiology

## 2012-08-07 VITALS — BP 170/75 | HR 56 | Ht 68.0 in | Wt 181.0 lb

## 2012-08-07 DIAGNOSIS — I4891 Unspecified atrial fibrillation: Secondary | ICD-10-CM

## 2012-08-07 MED ORDER — ATORVASTATIN CALCIUM 40 MG PO TABS: 60.0000 mg | ORAL_TABLET | Freq: Every evening | ORAL | Status: DC

## 2012-08-07 MED ORDER — DOFETILIDE 500 MCG PO CAPS: 500.0000 ug | ORAL_CAPSULE | Freq: Two times a day (BID) | ORAL | Status: DC

## 2012-08-07 NOTE — Patient Instructions (Addendum)
Stop Plavix and Fish Oil Continue all other medications as listed  Follow up in 6 months with Dr Antoine Poche.  You will receive a letter in the mail 2 months before you are due.  Please call us when you receive this letter to schedule your follow up appointment.

## 2012-08-07 NOTE — Progress Notes (Signed)
HPI The patient presents for followup of CAD. Since I last saw her she has had no new cardiovascular symptoms. She has stress at home. However, she thinks she is less depressed than previous and says it's episodic and situational. She started doing a little bit of a recumbent bicycling . With this she's had no symptoms.  The patient denies any new symptoms such as chest discomfort, neck or arm discomfort. There has been no new shortness of breath, PND or orthopnea. There have been no reported palpitations, presyncope or syncope.  She's had no obvious symptomatic recurrence of her atrial fibrillation.  Allergies  Allergen Reactions  . Acetaminophen   . Oxycodone-Acetaminophen   . Penicillins   . Prednisone   . Sulfonamide Derivatives   . Miralax (Polyethylene Glycol) Rash    Took [facility] brand developed rash. Patient states she tolerated name brand MiraLax in the past.    Current Outpatient Prescriptions  Medication Sig Dispense Refill  . ALPRAZolam (XANAX) 0.25 MG tablet Take 0.25 mg by mouth daily as needed.       Marland Kitchen aspirin 81 MG tablet Take 81 mg by mouth every morning.       Marland Kitchen atorvastatin (LIPITOR) 40 MG tablet Take 60 mg by mouth every evening.      . clopidogrel (PLAVIX) 75 MG tablet Take 75 mg by mouth every morning.       . dofetilide (TIKOSYN) 500 MCG capsule Take 500 mcg by mouth 2 (two) times daily.        . fish oil-omega-3 fatty acids 1000 MG capsule Take 1 g by mouth every morning.       Marland Kitchen ibuprofen (ADVIL,MOTRIN) 200 MG tablet Take 200-400 mg by mouth daily as needed. For pain      . lisinopril (PRINIVIL,ZESTRIL) 40 MG tablet Take 40 mg by mouth every morning.       . metFORMIN (GLUMETZA) 500 MG (MOD) 24 hr tablet Take 500 mg by mouth every evening.       . niacin (NIASPAN) 500 MG CR tablet Take 500 mg by mouth at bedtime.        . nitroGLYCERIN (NITROSTAT) 0.4 MG SL tablet Place 0.4 mg under the tongue every 5 (five) minutes as needed.        . pantoprazole (PROTONIX) 40 MG  tablet Take 40 mg by mouth every morning.       . potassium chloride SA (K-DUR,KLOR-CON) 20 MEQ tablet Take 20 mEq by mouth 2 (two) times daily.        Marland Kitchen warfarin (COUMADIN) 5 MG tablet Take 5 mg by mouth every evening.      Marland Kitchen DISCONTD: lisinopril (PRINIVIL,ZESTRIL) 10 MG tablet Take 1 tablet (10 mg total) by mouth daily.  30 tablet  11    Past Medical History  Diagnosis Date  . HLD (hyperlipidemia)   . HTN (hypertension)   . Arthritis   . Foot deformity     right  . CAD (coronary artery disease)     Circumflex occlusion February 14, 2011 medical treatment,  DES to OM2, LAD  40%, D1 30%  . Tobacco abuse   . Diabetes mellitus   . Atrial fibrillation   . Anxiety   . Diverticulitis of colon 2008 and 04/2011    Past Surgical History  Procedure Date  . Tonsillectomy and adenoidectomy   . Knee arthroscopy     left  . Tubal ligation   . Right leg surgery age 48  As a child for  ?scleroderma per patient  . Ureteral surgery     ROS:  As stated in the HPI and negative for all other systems.  PHYSICAL EXAM BP 170/75  Pulse 56  Ht 5\' 8"  (1.727 m)  Wt 181 lb (82.101 kg)  BMI 27.52 kg/m2 GENERAL: Well appearing  HEENT: Pupils equal round and reactive, fundi not visualized, oral mucosa unremarkable, dentures NECK: No jugular venous distention, waveform within normal limits, carotid upstroke brisk and symmetric, no bruits, no thyromegaly  LYMPHATICS: No cervical, inguinal adenopathy  LUNGS: Clear to auscultation bilaterally  BACK: No CVA tenderness  CHEST: Unremarkable  HEART: PMI not displaced or sustained,S1 and S2 within normal limits, no S3, no S4, no clicks, no rubs, no murmurs  ABD: Flat, positive bowel sounds normal in frequency in pitch, no bruits, no rebound, no guarding, no midline pulsatile mass, no hepatomegaly, no splenomegaly  EXT: 2 plus pulses throughout, no edema, no cyanosis no , 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 08/07/2012   ASSESSMENT AND PLAN  CAD (coronary artery disease) -  The patient has no new sypmtoms. No further cardiovascular testing is indicated. We will continue with aggressive risk reduction and meds as listed. She can sto and is ap her Plavix as she is on ASA and warfarin  HYPERTENSION -  I did review her home BP readings.  These have been OK.  Given this no change in therapy is indciated.  Atrial fibrillation -  She is maintaining sinus rhythm on Tikosyn and she will continue with this.  Depression -  This is less of a problem than previous. She does not want to be on an antidepressant.  I will defer to Herb Grays, MD

## 2012-10-24 ENCOUNTER — Encounter (HOSPITAL_COMMUNITY): Payer: Self-pay | Admitting: Emergency Medicine

## 2012-10-24 ENCOUNTER — Other Ambulatory Visit: Payer: Self-pay | Admitting: Nurse Practitioner

## 2012-10-24 ENCOUNTER — Emergency Department (HOSPITAL_COMMUNITY): Payer: Medicare Other

## 2012-10-24 ENCOUNTER — Emergency Department (HOSPITAL_COMMUNITY)
Admission: EM | Admit: 2012-10-24 | Discharge: 2012-10-24 | Disposition: A | Discharge status: Home / Self Care | Payer: Medicare Other | Attending: Emergency Medicine | Admitting: Emergency Medicine

## 2012-10-24 DIAGNOSIS — I2510 Atherosclerotic heart disease of native coronary artery without angina pectoris: Secondary | ICD-10-CM

## 2012-10-24 DIAGNOSIS — Z79899 Other long term (current) drug therapy: Secondary | ICD-10-CM | POA: Insufficient documentation

## 2012-10-24 DIAGNOSIS — R079 Chest pain, unspecified: Secondary | ICD-10-CM | POA: Insufficient documentation

## 2012-10-24 DIAGNOSIS — Z87891 Personal history of nicotine dependence: Secondary | ICD-10-CM | POA: Insufficient documentation

## 2012-10-24 DIAGNOSIS — E785 Hyperlipidemia, unspecified: Secondary | ICD-10-CM | POA: Insufficient documentation

## 2012-10-24 DIAGNOSIS — Z8719 Personal history of other diseases of the digestive system: Secondary | ICD-10-CM | POA: Insufficient documentation

## 2012-10-24 DIAGNOSIS — R110 Nausea: Secondary | ICD-10-CM | POA: Insufficient documentation

## 2012-10-24 DIAGNOSIS — E119 Type 2 diabetes mellitus without complications: Secondary | ICD-10-CM | POA: Insufficient documentation

## 2012-10-24 DIAGNOSIS — I10 Essential (primary) hypertension: Secondary | ICD-10-CM | POA: Insufficient documentation

## 2012-10-24 DIAGNOSIS — F411 Generalized anxiety disorder: Secondary | ICD-10-CM | POA: Insufficient documentation

## 2012-10-24 DIAGNOSIS — M129 Arthropathy, unspecified: Secondary | ICD-10-CM | POA: Insufficient documentation

## 2012-10-24 DIAGNOSIS — I4891 Unspecified atrial fibrillation: Secondary | ICD-10-CM

## 2012-10-24 HISTORY — DX: Crossing vessel and stricture of ureter without hydronephrosis: N13.5

## 2012-10-24 LAB — COMPREHENSIVE METABOLIC PANEL
AST: 21 U/L (ref 0–37)
Albumin: 3.5 g/dL (ref 3.5–5.2)
Alkaline Phosphatase: 125 U/L — ABNORMAL HIGH (ref 39–117)
Chloride: 104 mEq/L (ref 96–112)
Potassium: 4.6 mEq/L (ref 3.5–5.1)
Total Bilirubin: 0.4 mg/dL (ref 0.3–1.2)

## 2012-10-24 LAB — CBC WITH DIFFERENTIAL/PLATELET
Basophils Absolute: 0 10*3/uL (ref 0.0–0.1)
Basophils Relative: 0 % (ref 0–1)
Hemoglobin: 12.7 g/dL (ref 12.0–15.0)
MCHC: 33.9 g/dL (ref 30.0–36.0)
Neutro Abs: 3.9 10*3/uL (ref 1.7–7.7)
Neutrophils Relative %: 64 % (ref 43–77)
RDW: 15.5 % (ref 11.5–15.5)

## 2012-10-24 LAB — URINALYSIS, ROUTINE W REFLEX MICROSCOPIC
Glucose, UA: NEGATIVE mg/dL
Hgb urine dipstick: NEGATIVE
Ketones, ur: NEGATIVE mg/dL
Protein, ur: NEGATIVE mg/dL

## 2012-10-24 LAB — URINE MICROSCOPIC-ADD ON

## 2012-10-24 LAB — POCT I-STAT TROPONIN I: Troponin i, poc: 0 ng/mL (ref 0.00–0.08)

## 2012-10-24 MED ORDER — SODIUM CHLORIDE 0.9 % IV SOLN
Freq: Once | INTRAVENOUS | Status: AC
  Administered 2012-10-24: 15:00:00 via INTRAVENOUS

## 2012-10-24 MED ORDER — AMLODIPINE BESYLATE 2.5 MG PO TABS: 2.5000 mg | ORAL_TABLET | Freq: Every day | ORAL | Status: DC

## 2012-10-24 NOTE — Consult Note (Signed)
Patient ID: Melody Casey MRN: [id number], DOB/AGE: 10-Nov-1946   Admit date: 10/24/2012   Primary Physician: Herb Grays, MD Primary Cardiologist: Shela Commons. Hochrein, MD  Pt. Profile:  66 y/o female with h/o CAD s/p OM2 stenting in 02/2011, who presented to ED today after BP was high during the night.  Problem List  Past Medical History  Diagnosis Date  . HLD (hyperlipidemia)   . HTN (hypertension)   . Arthritis   . Foot deformity     right  . CAD (coronary artery disease)     a. NSTEMI 02/2011: occ mid Cx, DES to OM2, residual nonobst LAD dz.  . Tobacco abuse   . Diabetes mellitus   . Atrial fibrillation     a. Dx 03/2011 - on tikosyn/coumadin.  . Anxiety   . Diverticulitis of colon 2008 and 04/2011  . Ureteral obstruction     History of gross hematuria/right hydronephrosis 2/2 to uteropelvic junction obstruction, s/p cystoscopy in January 2007 with bilateral retrograde pyelography, right ureter arthroscopy, right ureteral stent placement, bladder biopsies, stent removal since then.    Past Surgical History  Procedure Date  . Tonsillectomy and adenoidectomy   . Knee arthroscopy     left  . Tubal ligation   . Right leg surgery age 89    As a child for  ?scleroderma per patient  . Ureteral surgery     Allergies  Allergies  Allergen Reactions  . Acetaminophen   . Oxycodone-Acetaminophen   . Penicillins   . Prednisone   . Miralax (Polyethylene Glycol) Rash    Took [facility] brand developed rash. Patient states she tolerated name brand MiraLax in the past.  . Sulfa Antibiotics Rash   HPI  66 year old female with prior history of coronary artery disease status post MI in March of 2012 with drug loading stent placement to the second obtuse marginal. At that time she had an occluded left circumflex as well as nonobstructive disease in the LAD, which has been medically managed. She was subsequently diagnosed with atrial fibrillation and after recurrent A. fib despite diltiazem  therapy tikosyn was initiated in June of 2012.  She believes that for the most part she maintained sinus rhythm no there was one time over the summer where she checked her blood pressure noted that she had an irregular heartbeat on the heart monitor with rates in the low 100s. This lasted a few hours and she did not believe she was symptomatic. She has since followed with Dr. Antoine Poche.  She's been maintained on Tikosyn and Coumadin and overall has done well.  Last night approximately 9 PM, she was sitting and reading and "felt [her] blood pressure was high." She describes the sensation as similar to anxiety. She denies chest pain or dyspnea though did have a queasy feeling in her stomach. She took a Xanax and this sensation of hypertension resolved over the next hour or so and she was able to fall off to sleep. She awoke suddenly at approximately 1:30 AM again feeling as though her blood pressure was high associated with some degree of anxiety. She checked her blood pressure it was greater than 200 systolically. She took a full-strength aspirin along with another Xanax, and 10 mg of lisinopril. She then read in bed in over the next hour and a half her pressure came down to the 130s over 80s and she no longer felt anxious. She was able to fall back to sleep. Today, she decided to see her primary care  provider and after describing her symptoms an ECG was performed and patient was sent to the ED. Here, ECG shows no acute changes. Her troponin is normal. She has no complaints at this time.  Home Medications  Prior to Admission medications   Medication Sig Start Date End Date Taking? Authorizing Provider  ALPRAZolam (XANAX) 0.25 MG tablet Take 0.25 mg by mouth daily as needed.    Yes Historical Provider, MD  aspirin 81 MG tablet Take 81 mg by mouth every morning.    Yes Historical Provider, MD  atorvastatin (LIPITOR) 40 MG tablet Take 1.5 tablets (60 mg total) by mouth every evening. 08/07/12  Yes Rollene Rotunda, MD  dofetilide (TIKOSYN) 500 MCG capsule Take 1 capsule (500 mcg total) by mouth 2 (two) times daily. 08/07/12  Yes Rollene Rotunda, MD  ibuprofen (ADVIL,MOTRIN) 200 MG tablet Take 200-400 mg by mouth daily as needed. For pain   Yes Historical Provider, MD  lisinopril (PRINIVIL,ZESTRIL) 40 MG tablet Take 40 mg by mouth every morning.    Yes Historical Provider, MD  metFORMIN (GLUMETZA) 500 MG (MOD) 24 hr tablet Take 500 mg by mouth every evening.    Yes Historical Provider, MD  niacin (NIASPAN) 500 MG CR tablet Take 500 mg by mouth at bedtime.     Yes Historical Provider, MD  nitroGLYCERIN (NITROSTAT) 0.4 MG SL tablet Place 0.4 mg under the tongue every 5 (five) minutes as needed.     Yes Historical Provider, MD  pantoprazole (PROTONIX) 40 MG tablet Take 40 mg by mouth every morning.    Yes Historical Provider, MD  potassium chloride SA (K-DUR,KLOR-CON) 20 MEQ tablet Take 20 mEq by mouth 2 (two) times daily.     Yes Historical Provider, MD  warfarin (COUMADIN) 5 MG tablet Take 5-7.5 mg by mouth every evening. Take 7.5 mg on mon, wed, and fri.  Then the rest of the week take 5mg    Yes Historical Provider, MD   Family History  Family History  Problem Relation Age of Onset  . Colon cancer Neg Hx   . Liver disease Neg Hx   . Lung cancer Father 85    deceased  . Heart failure Mother 27    deceased   Social History  History   Social History  . Marital Status: Married    Spouse Name: N/A    Number of Children: 2  . 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 Smoking status: Former Smoker -- 1.0 packs/day for 50 years    Quit date: 02/08/2011  . Smokeless tobacco: Not on file  . Alcohol Use: No  . Drug Use: No  . Sexually Active: Not on file   Other Topics Concern  . 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     Review of Systems General:  No chills, fever,  night sweats or weight changes.  Anxiety as outlined above.  She reports that she does snore and has been told in the past that she should have a sleep study. Cardiovascular:  No chest pain, dyspnea on exertion, edema, orthopnea, palpitations, paroxysmal nocturnal dyspnea. Dermatological: No rash, lesions/masses Respiratory: No cough, dyspnea. Urologic: No hematuria, dysuria Abdominal:   No nausea, vomiting, diarrhea, bright red blood per rectum, melena, or hematemesis Neurologic:  No visual changes, wkns, changes in mental status. All other systems reviewed and are  otherwise negative except as noted above.  Physical Exam  Blood pressure 133/61, pulse 61, temperature 98.1 F (36.7 C), temperature source Oral, resp. rate 13, SpO2 95.00%.  General: Pleasant, NAD Psych: Normal affect. Neuro: Alert and oriented X 3. Moves all extremities spontaneously. HEENT: Normal  Neck: Supple without bruits or JVD. Lungs:  Resp regular and unlabored, CTA. Heart: RRR no s3, s4, or murmurs. Abdomen: Soft, non-tender, non-distended, BS + x 4.  Extremities: No clubbing, cyanosis or edema. DP/PT/Radials 2+ and equal bilaterally.  Labs  Trop i, poc: 0.00  Lab Results  Component Value Date   WBC 6.2 10/24/2012   HGB 12.7 10/24/2012   HCT 37.5 10/24/2012   MCV 84.8 10/24/2012   PLT 218 10/24/2012    Lab 10/24/12 1450  NA 138  K 4.6  CL 104  CO2 26  BUN 15  CREATININE 0.69  CALCIUM 9.5  PROT 7.5  BILITOT 0.4  ALKPHOS 125*  ALT 18  AST 21  GLUCOSE 110*   Radiology/Studies  Dg Chest Port 1 View  10/24/2012  *RADIOLOGY REPORT*  Clinical Data: Hypertension, coronary disease post stenting, chest pain  PORTABLE CHEST - 1 VIEW  Comparison: Portable exam 1519 hours compared to 05/18/2011  Findings: Normal heart size and pulmonary vascularity. Calcified tortuous aorta. Lungs grossly clear. No definite pleural effusion or pneumothorax. Bones unremarkable.  IMPRESSION: No acute abnormalities.    Original Report Authenticated By: Ulyses Southward, M.D.    ECG  Sb, 59, no acute st/t changes.  ASSESSMENT AND PLAN  1. Hypertension: Patient presented to the ED today secondary to evidence of hypertension during the night. She is already on lisinopril 40 mg daily at home and spiked her blood pressure over 200 last night. Here in the ED upon presentation, she was 150/79. We have sent in a prescription for amlodipine 2.5 mg daily, #30 with 6 refills to her local pharmacy.  The symptoms she describes during the night appear in part to be related to anxiety and is therefore difficult to know if that may have been driving her extreme hypertension. She had no chest pain or dyspnea and her ECG shows no evidence of ischemia. Her troponin is normal now more than 17 hours after the event. We'll not plan to pursue any additional ischemic evaluation at this time and feel she can be discharged from the ED today.  2. Coronary artery disease: Patient denies chest pain or dyspnea. Troponin is normal and ECG is nonischemic. As above, no further ischemic evaluation. Discharge ED today continue prior home medications.  3. Anxiety: Likely playing a role in her symptoms during the night. Symptoms were at least partially relieved with Xanax. Symptoms she describes in the middle of the night could potentially be related to sleep apnea she does report a history of snoring. We will arrange for an outpatient sleep study.  4. Snoring: ? Sleep apnea. As above, arrange for sleep study.  5. Atrial fibrillation: Patient reports irregular heartbeat during spell during the night with heart rates in the 80s. She is on Tikosyn and also Coumadin. It's possible that she could be having paroxysms of fibrillation which may be contributing to her symptoms. We'll consider outpatient monitoring to assess her burden of paroxysmal A. fib and utility of Tikosyn.   6. Diabetes mellitus: Continue metformin.  7. Hyperlipidemia: Continue statin  therapy.  Signed, Nicolasa Ducking, NP 10/24/2012, 4:45 PM  Patient seen and examined.  Agree with findings of C Berge  Patient with history of  CAD   She has had intervention in the past.  Last night felt like her BP was up.  Anxious  BP was high.  Symptoms eased with xanax.  Patient took meds as usual. Today feeling OK BP higher on arrival to ER   EKG without acute changes.   Trop negative, now almost 18 hours from start  On exam:  BP 146/79  Neck:  JVP is normal  No bruits  Lungs are CTA  Cardiac:  RRR  No S3  No Murmurs  Ext:  No edema  2+ pulses  Plan. Patient can go home   Would Rx with Norvasc 2.5.  Continue other meds Set up for outpatient sleep study.  F/U in cardiology.   Dietrich Pates 5:42 PM 10/24/2012

## 2012-10-24 NOTE — ED Notes (Signed)
Pt EMS- pt was sent from DR office today due to EKG changes with T wave inversions. Pt denies chest pain. Reports elevated BP last night. Pt was given 325 of asprin at dr office. BP today 158/89.

## 2012-10-24 NOTE — ED Provider Notes (Signed)
History     CSN: [id number]  Arrival date & time 10/24/12  1311   First MD Initiated Contact with Patient 10/24/12 1404      Chief Complaint  Patient presents with  . Abnormal ECG    (Consider location/radiation/quality/duration/timing/severity/associated sxs/prior treatment) HPI Comments: Pt is a 66 year old woman whose symptoms began last night around 9:30 P.M. With a feeling of anxiety and nausea. At 1 A.M. She woke up and felt that her blood pressure was up--she again had a vague feeling in her chest and had nause.  Her BP was high at 202/121.  She took Xanax 0.25 mg, 10 mg lisinopril and aspirin 325, and in an hour the BP was 138/80.  This morning her BP was 139/83.  She was seen by her primary care physician, Herb Grays, M.D., who did an EKG and advised her to go tot the [facility] ED.  She also called [facility], as pt had had a coronary artery stent about a year ago.  Patient is a 66 y.o. female presenting with chest pain. The history is provided by the patient.  Chest Pain The chest pain began 6 - 12 hours ago. Episode Length: Intermittent vague chest pain and nausea. The chest pain is unchanged. Associated with: Nothing. The severity of the pain is mild. Quality: A feeling of nausea and mild anxiety. The pain does not radiate. Exacerbated by: Nothing. Primary symptoms include nausea. Pertinent negatives for primary symptoms include no fever. She tried aspirin (Antihypertensives, benzodiazepines.) for the symptoms.  Her past medical history is significant for arrhythmia, CAD and diabetes.  Procedure history is positive for cardiac catheterization.     Past Medical History  Diagnosis Date  . HLD (hyperlipidemia)   . HTN (hypertension)   . Arthritis   . Foot deformity     right  . CAD (coronary artery disease)     Circumflex occlusion February 14, 2011 medical treatment,  DES to OM2, LAD  40%, D1 30%  . Tobacco abuse   . Diabetes mellitus   . Atrial fibrillation   .  Anxiety   . Diverticulitis of colon 2008 and 04/2011    Past Surgical History  Procedure Date  . Tonsillectomy and adenoidectomy   . Knee arthroscopy     left  . Tubal ligation   . Right leg surgery age 37    As a child for  ?scleroderma per patient  . Ureteral surgery     Family History  Problem Relation Age of Onset  . Colon cancer Neg Hx   . Liver disease Neg Hx   . Lung cancer Father 36    deceased  . Heart failure Mother 42    deceased    History  Substance Use Topics  . Smoking status: Former Smoker -- 1.0 packs/day for 50 years    Quit date: 02/08/2011  . Smokeless tobacco: Not on file  . Alcohol Use: No    OB History    Grav Para Term Preterm Abortions TAB SAB Ect Mult Living                  Review of Systems  Constitutional: Negative.  Negative for fever and chills.  HENT: Negative.   Eyes: Negative.   Respiratory: Negative.   Cardiovascular: Positive for chest pain.  Gastrointestinal: Positive for nausea.  Genitourinary: Negative.  Negative for difficulty urinating.  Musculoskeletal: Negative.   Neurological: Negative.   Psychiatric/Behavioral: The patient is nervous/anxious.  Allergies  Acetaminophen; Oxycodone-acetaminophen; Penicillins; Prednisone; Miralax; and Sulfa antibiotics  Home Medications   Current Outpatient Rx  Name  Route  Sig  Dispense  Refill  . ALPRAZOLAM 0.25 MG PO TABS   Oral   Take 0.25 mg by mouth daily as needed.          . ASPIRIN 81 MG PO TABS   Oral   Take 81 mg by mouth every morning.          . ATORVASTATIN CALCIUM 40 MG PO TABS   Oral   Take 1.5 tablets (60 mg total) by mouth every evening.   135 tablet   3   . DOFETILIDE 500 MCG PO CAPS   Oral   Take 1 capsule (500 mcg total) by mouth 2 (two) times daily.   60 capsule   11   . IBUPROFEN 200 MG PO TABS   Oral   Take 200-400 mg by mouth daily as needed. For pain         . LISINOPRIL 40 MG PO TABS   Oral   Take 40 mg by mouth every  morning.          Marland Kitchen METFORMIN HCL ER (MOD) 500 MG PO TB24   Oral   Take 500 mg by mouth every evening.          Marland Kitchen NIACIN ER (ANTIHYPERLIPIDEMIC) 500 MG PO TBCR   Oral   Take 500 mg by mouth at bedtime.           Marland Kitchen NITROGLYCERIN 0.4 MG SL SUBL   Sublingual   Place 0.4 mg under the tongue every 5 (five) minutes as needed.           Marland Kitchen PANTOPRAZOLE SODIUM 40 MG PO TBEC   Oral   Take 40 mg by mouth every morning.          Marland Kitchen POTASSIUM CHLORIDE CRYS ER 20 MEQ PO TBCR   Oral   Take 20 mEq by mouth 2 (two) times daily.           . WARFARIN SODIUM 5 MG PO TABS   Oral   Take 5-7.5 mg by mouth every evening. Take 7.5 mg on mon, wed, and fri.  Then the rest of the week take 5mg            BP 158/79  Pulse 56  Temp 98.1 F (36.7 C) (Oral)  Resp 16  SpO2 98%  Physical Exam  Nursing note and vitals reviewed. Constitutional: She is oriented to person, place, and time. She appears well-developed and well-nourished. No distress.  HENT:  Head: Normocephalic and atraumatic.  Right Ear: External ear normal.  Left Ear: External ear normal.  Mouth/Throat: Oropharynx is clear and moist.  Eyes: Conjunctivae normal and EOM are normal. Pupils are equal, round, and reactive to light.  Neck: Normal range of motion. Neck supple.  Cardiovascular: Normal rate, regular rhythm and normal heart sounds.   Pulmonary/Chest: Effort normal and breath sounds normal.  Abdominal: Soft. Bowel sounds are normal.  Musculoskeletal: Normal range of motion. She exhibits no edema and no tenderness.  Neurological: She is alert and oriented to person, place, and time.       No sensory or motor deficit.  Skin: Skin is warm and dry.  Psychiatric: She has a normal mood and affect. Her behavior is normal.    ED Course  Procedures (including critical care time)  2:05 PM  Date: 10/24/2012  Rate: 59  Rhythm: normal sinus rhythm  QRS Axis: normal  Intervals: normal  ST/T Wave abnormalities: normal   Conduction Disutrbances:none  Narrative Interpretation: Normal EKG  Old EKG Reviewed: none available  Results for orders placed during the hospital encounter of 10/24/12  CBC WITH DIFFERENTIAL      Component Value Range   WBC 6.2  4.0 - 10.5 K/uL   RBC 4.42  3.87 - 5.11 MIL/uL   Hemoglobin 12.7  12.0 - 15.0 g/dL   HCT 16.1  09.6 - 04.5 %   MCV 84.8  78.0 - 100.0 fL   MCH 28.7  26.0 - 34.0 pg   MCHC 33.9  30.0 - 36.0 g/dL   RDW 40.9  81.1 - 91.4 %   Platelets 218  150 - 400 K/uL   Neutrophils Relative 64  43 - 77 %   Neutro Abs 3.9  1.7 - 7.7 K/uL   Lymphocytes Relative 27  12 - 46 %   Lymphs Abs 1.7  0.7 - 4.0 K/uL   Monocytes Relative 7  3 - 12 %   Monocytes Absolute 0.4  0.1 - 1.0 K/uL   Eosinophils Relative 2  0 - 5 %   Eosinophils Absolute 0.1  0.0 - 0.7 K/uL   Basophils Relative 0  0 - 1 %   Basophils Absolute 0.0  0.0 - 0.1 K/uL  COMPREHENSIVE METABOLIC PANEL      Component Value Range   Sodium 138  135 - 145 mEq/L   Potassium 4.6  3.5 - 5.1 mEq/L   Chloride 104  96 - 112 mEq/L   CO2 26  19 - 32 mEq/L   Glucose, Bld 110 (*) 70 - 99 mg/dL   BUN 15  6 - 23 mg/dL   Creatinine, Ser 7.82  0.50 - 1.10 mg/dL   Calcium 9.5  8.4 - 95.6 mg/dL   Total Protein 7.5  6.0 - 8.3 g/dL   Albumin 3.5  3.5 - 5.2 g/dL   AST 21  0 - 37 U/L   ALT 18  0 - 35 U/L   Alkaline Phosphatase 125 (*) 39 - 117 U/L   Total Bilirubin 0.4  0.3 - 1.2 mg/dL   GFR calc non Af Amer 87 (*) >90 mL/min   GFR calc Af Amer >90  >90 mL/min  URINALYSIS, ROUTINE W REFLEX MICROSCOPIC      Component Value Range   Color, Urine YELLOW  YELLOW   APPearance CLEAR  CLEAR   Specific Gravity, Urine 1.015  1.005 - 1.030   pH 6.5  5.0 - 8.0   Glucose, UA NEGATIVE  NEGATIVE mg/dL   Hgb urine dipstick NEGATIVE  NEGATIVE   Bilirubin Urine NEGATIVE  NEGATIVE   Ketones, ur NEGATIVE  NEGATIVE mg/dL   Protein, ur NEGATIVE  NEGATIVE mg/dL   Urobilinogen, UA 0.2  0.0 - 1.0 mg/dL   Nitrite NEGATIVE  NEGATIVE   Leukocytes,  UA LARGE (*) NEGATIVE  POCT I-STAT TROPONIN I      Component Value Range   Troponin i, poc 0.00  0.00 - 0.08 ng/mL   Comment 3           URINE MICROSCOPIC-ADD ON      Component Value Range   Squamous Epithelial / LPF MANY (*) RARE   WBC, UA 3-6  <3 WBC/hpf   RBC / HPF 0-2  <3 RBC/hpf   Bacteria, UA RARE  RARE   Dg Chest Port 1 View  10/24/2012  *RADIOLOGY  REPORT*  Clinical Data: Hypertension, coronary disease post stenting, chest pain  PORTABLE CHEST - 1 VIEW  Comparison: Portable exam 1519 hours compared to 05/18/2011  Findings: Normal heart size and pulmonary vascularity. Calcified tortuous aorta. Lungs grossly clear. No definite pleural effusion or pneumothorax. Bones unremarkable.  IMPRESSION: No acute abnormalities.   Original Report Authenticated By: Ulyses Southward, M.D.     Pt was seen by me and had physical exam, EKG, chest x-ray, and lab tests, all negative. She was seen in consultation by [facility], who made arrangements to see her in office followup.      1. Atrial fibrillation   2. CAD (coronary artery disease)   3. High risk medication use   4. Unspecified essential hypertension        Carleene Cooper III, MD 10/24/12 2118

## 2012-11-10 ENCOUNTER — Encounter: Payer: Self-pay | Admitting: Cardiology

## 2012-11-10 ENCOUNTER — Ambulatory Visit (INDEPENDENT_AMBULATORY_CARE_PROVIDER_SITE_OTHER): Payer: Medicare Other | Admitting: Cardiology

## 2012-11-10 VITALS — BP 130/70 | HR 60 | Ht 68.0 in | Wt 183.6 lb

## 2012-11-10 DIAGNOSIS — I4891 Unspecified atrial fibrillation: Secondary | ICD-10-CM

## 2012-11-10 DIAGNOSIS — I2510 Atherosclerotic heart disease of native coronary artery without angina pectoris: Secondary | ICD-10-CM

## 2012-11-10 DIAGNOSIS — F17200 Nicotine dependence, unspecified, uncomplicated: Secondary | ICD-10-CM

## 2012-11-10 DIAGNOSIS — E785 Hyperlipidemia, unspecified: Secondary | ICD-10-CM

## 2012-11-10 DIAGNOSIS — I10 Essential (primary) hypertension: Secondary | ICD-10-CM

## 2012-11-10 NOTE — Patient Instructions (Addendum)
The current medical regimen is effective;  continue present plan and medications.  Follow up in 6 months with Dr Hochrein.  You will receive a letter in the mail 2 months before you are due.  Please call us when you receive this letter to schedule your follow up appointment.  

## 2012-11-10 NOTE — Progress Notes (Signed)
HPI The patient presents for followup of HTN.  Since I last saw her she had an episode of hypertension in the middle of the night. She woke from sleep feeling anxious. Her blood pressure was 202/121. She took Xanax lisinopril and an aspirin. She told Dr. Collins Scotland about this the next day and was sent to the emergency room. Her blood pressure was okay in the ER. She did see Dr. Tenny Craw in consultation and it was suggested to add Norvasc 2.5 mg to her regimen. She otherwise has felt well. She's otherwise had no chest discomfort, neck or arm discomfort. She's had no new shortness of breath, PND or orthopnea. She's had no palpitations, presyncope or syncope. She doesn't think she's had any depression or excessive anxiety.  Allergies  Allergen Reactions  . Acetaminophen   . Oxycodone-Acetaminophen   . Penicillins   . Prednisone   . Miralax (Polyethylene Glycol) Rash    Took [employer] brand developed rash. Patient states she tolerated name brand MiraLax in the past.  . Sulfa Antibiotics Rash    Current Outpatient Prescriptions  Medication Sig Dispense Refill  . ALPRAZolam (XANAX) 0.25 MG tablet Take 0.25 mg by mouth daily as needed.       Marland Kitchen amLODipine (NORVASC) 2.5 MG tablet Take 1 tablet (2.5 mg total) by mouth daily.  30 tablet  6  . aspirin 81 MG tablet Take 81 mg by mouth every morning.       Marland Kitchen atorvastatin (LIPITOR) 40 MG tablet Take 1.5 tablets (60 mg total) by mouth every evening.  135 tablet  3  . dofetilide (TIKOSYN) 500 MCG capsule Take 1 capsule (500 mcg total) by mouth 2 (two) times daily.  60 capsule  11  . ibuprofen (ADVIL,MOTRIN) 200 MG tablet Take 200-400 mg by mouth daily as needed. For pain      . lisinopril (PRINIVIL,ZESTRIL) 40 MG tablet Take 40 mg by mouth every morning.       . metFORMIN (GLUMETZA) 500 MG (MOD) 24 hr tablet Take 500 mg by mouth every evening.       . niacin (NIASPAN) 500 MG CR tablet Take 500 mg by mouth at bedtime.        . nitroGLYCERIN (NITROSTAT) 0.4 MG SL tablet  Place 0.4 mg under the tongue every 5 (five) minutes as needed.        . pantoprazole (PROTONIX) 40 MG tablet Take 40 mg by mouth every morning.       . potassium chloride SA (K-DUR,KLOR-CON) 20 MEQ tablet Take 20 mEq by mouth 2 (two) times daily.        Marland Kitchen warfarin (COUMADIN) 5 MG tablet Take 5-7.5 mg by mouth every evening. Take 7.5 mg on mon, wed, and fri.  Then the rest of the week take 5mg         Past Medical History  Diagnosis Date  . HLD (hyperlipidemia)   . HTN (hypertension)   . Arthritis   . Foot deformity     right  . CAD (coronary artery disease)     a. NSTEMI 02/2011: occ mid Cx, DES to OM2, residual nonobst LAD dz.  . Tobacco abuse   . Diabetes mellitus   . Atrial fibrillation     a. Dx 03/2011 - on tikosyn/coumadin.  . Anxiety   . Diverticulitis of colon 2008 and 04/2011  . Ureteral obstruction     History of gross hematuria/right hydronephrosis 2/2 to uteropelvic junction obstruction, s/p cystoscopy in January 2007 with bilateral  retrograde pyelography, right ureter arthroscopy, right ureteral stent placement, bladder biopsies, stent removal since then.    Past Surgical History  Procedure Date  . Tonsillectomy and adenoidectomy   . Knee arthroscopy     left  . Tubal ligation   . Right leg surgery age 66    As a child for  ?scleroderma per patient  . Ureteral surgery     ROS:  As stated in the HPI and negative for all other systems.  PHYSICAL EXAM BP 130/70  Pulse 60  Ht 5\' 8"  (1.727 m)  Wt 183 lb 9.6 oz (83.28 kg)  BMI 27.92 kg/m2 GENERAL: Well appearing  NECK: No jugular venous distention, waveform within normal limits, carotid upstroke brisk and symmetric, no bruits, no thyromegaly   LUNGS: Clear to auscultation bilaterally  CHEST: Unremarkable  HEART: PMI not displaced or sustained,S1 and S2 within normal limits, no S3, no S4, no clicks, no rubs, no murmurs  ABD: Flat, positive bowel sounds normal in frequency in pitch, no bruits, no rebound, no  guarding, no midline pulsatile mass, no hepatomegaly, no splenomegaly  EXT: 2 plus pulses throughout, no edema, no cyanosis no , 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 Otherwise I don't think any further workup is suggested. I think her treatment using Xanax and an additional lisinopril for that event was reasonable.  Atrial fibrillation -  She is maintaining sinus rhythm on Tikosyn and she will continue with this.

## 2013-02-04 ENCOUNTER — Other Ambulatory Visit: Payer: Self-pay

## 2013-02-04 MED ORDER — ATORVASTATIN CALCIUM 40 MG PO TABS: 60.0000 mg | ORAL_TABLET | Freq: Every evening | ORAL | Status: DC

## 2013-02-04 NOTE — Telephone Encounter (Signed)
..   Requested Prescriptions   Signed Prescriptions Disp Refills  . atorvastatin (LIPITOR) 40 MG tablet 135 tablet 3    Sig: Take 1.5 tablets (60 mg total) by mouth every evening.    Authorizing Provider: Rollene Rotunda    Ordering User: Christella Hartigan, Kimarion Chery Judie Petit

## 2013-02-05 ENCOUNTER — Other Ambulatory Visit: Payer: Self-pay | Admitting: *Deleted

## 2013-02-09 ENCOUNTER — Telehealth: Payer: Self-pay | Admitting: Cardiology

## 2013-02-09 MED ORDER — ATORVASTATIN CALCIUM 40 MG PO TABS: 60.0000 mg | ORAL_TABLET | Freq: Every evening | ORAL | Status: DC

## 2013-02-09 NOTE — Telephone Encounter (Signed)
Approval received thru the end of the year   Pt aware  Rx to be sent into [facility] as requested.

## 2013-02-09 NOTE — Telephone Encounter (Signed)
Pt needing quantity exception She take 40 mg 1 and 1/2 tablets a day of atorvastatin and they only want to fill #30 at a time. 1 [phone]  Member ID# [id number].

## 2013-02-09 NOTE — Telephone Encounter (Signed)
New Problem:    Patient called in wanting to speak with you about an exception request on her cholesterol medication for her insurance company.  Please call back.

## 2013-03-15 ENCOUNTER — Other Ambulatory Visit: Payer: Self-pay | Admitting: Nurse Practitioner

## 2013-04-22 ENCOUNTER — Ambulatory Visit (INDEPENDENT_AMBULATORY_CARE_PROVIDER_SITE_OTHER): Payer: Medicare Other | Admitting: Cardiology

## 2013-04-22 ENCOUNTER — Encounter: Payer: Self-pay | Admitting: Cardiology

## 2013-04-22 VITALS — BP 145/82 | HR 53 | Ht 68.0 in | Wt 181.0 lb

## 2013-04-22 DIAGNOSIS — E785 Hyperlipidemia, unspecified: Secondary | ICD-10-CM

## 2013-04-22 DIAGNOSIS — F17200 Nicotine dependence, unspecified, uncomplicated: Secondary | ICD-10-CM

## 2013-04-22 DIAGNOSIS — Z720 Tobacco use: Secondary | ICD-10-CM

## 2013-04-22 DIAGNOSIS — I2510 Atherosclerotic heart disease of native coronary artery without angina pectoris: Secondary | ICD-10-CM

## 2013-04-22 DIAGNOSIS — I10 Essential (primary) hypertension: Secondary | ICD-10-CM

## 2013-04-22 DIAGNOSIS — I4891 Unspecified atrial fibrillation: Secondary | ICD-10-CM

## 2013-04-22 NOTE — Progress Notes (Signed)
HPI The patient presents for followup of HTN.  Since I last saw her she had another episode of her blood pressure going up. She starts to feel any shoes she might feel her heart rate skipping. She took her blood pressure and it was in the 190s systolic. She took an extra 10 mg of lisinopril and call our on-call doctor. She was encouraged to restart Norvasc which she had stopped. Her blood pressures have started to come down as we see on the reading we see today. She does get anxious with this. She's not had any new chest pressure, neck or arm discomfort. She's not had any new shortness of breath, PND or orthopnea.   Allergies  Allergen Reactions  . Acetaminophen   . Oxycodone-Acetaminophen   . Penicillins   . Prednisone   . Miralax (Polyethylene Glycol) Rash    Took [employer] brand developed rash. Patient states she tolerated name brand MiraLax in the past.  . Sulfa Antibiotics Rash    Current Outpatient Prescriptions  Medication Sig Dispense Refill  . ALPRAZolam (XANAX) 0.25 MG tablet Take 0.25 mg by mouth daily as needed.       Marland Kitchen amLODipine (NORVASC) 2.5 MG tablet TAKE ONE TABLET BY MOUTH DAILY  30 tablet  6  . aspirin 81 MG tablet Take 81 mg by mouth every morning.       Marland Kitchen atorvastatin (LIPITOR) 40 MG tablet Take 1.5 tablets (60 mg total) by mouth every evening.  135 tablet  3  . dofetilide (TIKOSYN) 500 MCG capsule Take 1 capsule (500 mcg total) by mouth 2 (two) times daily.  60 capsule  11  . ibuprofen (ADVIL,MOTRIN) 200 MG tablet Take 200-400 mg by mouth daily as needed. For pain      . lisinopril (PRINIVIL,ZESTRIL) 40 MG tablet Take 40 mg by mouth every morning.       . metFORMIN (GLUMETZA) 500 MG (MOD) 24 hr tablet Take 500 mg by mouth every evening.       . niacin (NIASPAN) 500 MG CR tablet Take 500 mg by mouth at bedtime.        . nitroGLYCERIN (NITROSTAT) 0.4 MG SL tablet Place 0.4 mg under the tongue every 5 (five) minutes as needed.        . pantoprazole (PROTONIX) 40 MG tablet  Take 40 mg by mouth every morning.       . potassium chloride SA (K-DUR,KLOR-CON) 20 MEQ tablet Take 20 mEq by mouth 2 (two) times daily.        Marland Kitchen warfarin (COUMADIN) 5 MG tablet Take 5-7.5 mg by mouth every evening. Take 7.5 mg on mon, wed, and fri.  Then the rest of the week take 5mg        No current facility-administered medications for this visit.    Past Medical History  Diagnosis Date  . HLD (hyperlipidemia)   . HTN (hypertension)   . Arthritis   . Foot deformity     right  . CAD (coronary artery disease)     a. NSTEMI 02/2011: occ mid Cx, DES to OM2, residual nonobst LAD dz.  . Tobacco abuse   . Diabetes mellitus   . Atrial fibrillation     a. Dx 03/2011 - on tikosyn/coumadin.  . Anxiety   . Diverticulitis of colon 2008 and 04/2011  . Ureteral obstruction     History of gross hematuria/right hydronephrosis 2/2 to uteropelvic junction obstruction, s/p cystoscopy in January 2007 with bilateral retrograde pyelography, right ureter arthroscopy,  right ureteral stent placement, bladder biopsies, stent removal since then.    Past Surgical History  Procedure Laterality Date  . Tonsillectomy and adenoidectomy    . Knee arthroscopy      left  . Tubal ligation    . Right leg surgery  age 34    As a child for  ?scleroderma per patient  . Ureteral surgery      ROS:  As stated in the HPI and negative for all other systems.  PHYSICAL EXAM BP 145/82  Pulse 53  Ht 5\' 8"  (1.727 m)  Wt 181 lb (82.101 kg)  BMI 27.53 kg/m2 GENERAL: Well appearing  NECK: No jugular venous distention, waveform within normal limits, carotid upstroke brisk and symmetric, no bruits, no thyromegaly   LUNGS: Clear to auscultation bilaterally  CHEST: Unremarkable  HEART: PMI not displaced or sustained,S1 and S2 within normal limits, no S3, no S4, no clicks, no rubs, no murmurs  ABD: Flat, positive bowel sounds normal in frequency in pitch, no bruits, no rebound, no guarding, no midline pulsatile mass, no  hepatomegaly, no splenomegaly  EXT: 2 plus pulses throughout, no edema, no cyanosis no , 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  04/22/2013  ASSESSMENT AND PLAN  HYPERTENSION -  She had stopped taking Norvasc and this was restarted. I agree with this. We discussed when necessary dosing of medications but she doesn't want hydralazine or clonidine at this point.  We discussed stress management and relaxation.  Atrial fibrillation -  She is maintaining sinus rhythm on Tikosyn and she will continue with this.

## 2013-04-22 NOTE — Patient Instructions (Addendum)
The current medical regimen is effective;  continue present plan and medications.  Follow up in 6 months with Dr Hochrein.  You will receive a letter in the mail 2 months before you are due.  Please call us when you receive this letter to schedule your follow up appointment.  

## 2013-07-24 ENCOUNTER — Other Ambulatory Visit: Payer: Self-pay | Admitting: *Deleted

## 2013-07-24 MED ORDER — DOFETILIDE 500 MCG PO CAPS: 500.0000 ug | ORAL_CAPSULE | Freq: Two times a day (BID) | ORAL | Status: DC

## 2013-08-13 ENCOUNTER — Telehealth: Payer: Self-pay | Admitting: Physician Assistant

## 2013-08-13 NOTE — Telephone Encounter (Signed)
Pt's pharmacy in [facility] called regarding Tikosyn refill. Stated the pt is near due for refill but they have not received authorization yet. I see her refill was electronically sent in on 07/24/13 to store # [id number] in [facility] but the pharmacist said that was the wrong store number. She was going to call the other pharmacy to see if they would transfer rx and will let us know if they have any issues in doing so. Freeda Spivey PA-C

## 2013-11-12 ENCOUNTER — Other Ambulatory Visit: Payer: Self-pay | Admitting: *Deleted

## 2013-11-12 MED ORDER — AMLODIPINE BESYLATE 2.5 MG PO TABS: 2.5000 mg | ORAL_TABLET | Freq: Every day | ORAL | Status: DC

## 2013-12-16 ENCOUNTER — Encounter: Payer: Self-pay | Admitting: Cardiology

## 2013-12-16 ENCOUNTER — Ambulatory Visit (INDEPENDENT_AMBULATORY_CARE_PROVIDER_SITE_OTHER): Payer: Medicare Other | Admitting: Cardiology

## 2013-12-16 VITALS — BP 158/74 | HR 54 | Ht 68.0 in | Wt 188.0 lb

## 2013-12-16 DIAGNOSIS — I2510 Atherosclerotic heart disease of native coronary artery without angina pectoris: Secondary | ICD-10-CM

## 2013-12-16 DIAGNOSIS — I10 Essential (primary) hypertension: Secondary | ICD-10-CM | POA: Diagnosis not present

## 2013-12-16 DIAGNOSIS — I4891 Unspecified atrial fibrillation: Secondary | ICD-10-CM

## 2013-12-16 MED ORDER — AMLODIPINE BESYLATE 2.5 MG PO TABS: 2.5000 mg | ORAL_TABLET | Freq: Every day | ORAL | Status: DC

## 2013-12-16 NOTE — Progress Notes (Signed)
HPI The patient presents for followup of HTN.  Since I last saw her she has done well.  The patient denies any new symptoms such as chest discomfort, neck or arm discomfort. There has been no new shortness of breath, PND or orthopnea. There have been no reported palpitations, presyncope or syncope.  She has not been walking as much as I would like.  However, with her usual activity she denies any symptoms.   Allergies  Allergen Reactions  . Acetaminophen   . Oxycodone-Acetaminophen   . Penicillins   . Prednisone   . Miralax [Polyethylene Glycol] Rash    Took [employer] brand developed rash. Patient states she tolerated name brand MiraLax in the past.  . Sulfa Antibiotics Rash    Current Outpatient Prescriptions  Medication Sig Dispense Refill  . ALPRAZolam (XANAX) 0.25 MG tablet Take 0.25 mg by mouth daily as needed.       Marland Kitchen amLODipine (NORVASC) 2.5 MG tablet Take 1 tablet (2.5 mg total) by mouth daily.  30 tablet  0  . aspirin 81 MG tablet Take 81 mg by mouth every morning.       Marland Kitchen atorvastatin (LIPITOR) 40 MG tablet Take 1.5 tablets (60 mg total) by mouth every evening.  135 tablet  3  . dofetilide (TIKOSYN) 500 MCG capsule Take 1 capsule (500 mcg total) by mouth 2 (two) times daily.  60 capsule  11  . ibuprofen (ADVIL,MOTRIN) 200 MG tablet Take 200-400 mg by mouth daily as needed. For pain      . lisinopril (PRINIVIL,ZESTRIL) 40 MG tablet Take 40 mg by mouth every morning.       . metFORMIN (GLUMETZA) 500 MG (MOD) 24 hr tablet Take 500 mg by mouth every evening.       . niacin (NIASPAN) 500 MG CR tablet Take 500 mg by mouth at bedtime.        . nitroGLYCERIN (NITROSTAT) 0.4 MG SL tablet Place 0.4 mg under the tongue every 5 (five) minutes as needed.        . potassium chloride SA (K-DUR,KLOR-CON) 20 MEQ tablet Take 20 mEq by mouth 2 (two) times daily.        Marland Kitchen warfarin (COUMADIN) 5 MG tablet Take 5-7.5 mg by mouth every evening. Take 7.5 mg on mon, wed, and fri.  Then the rest of the week  take 5mg        No current facility-administered medications for this visit.    Past Medical History  Diagnosis Date  . HLD (hyperlipidemia)   . HTN (hypertension)   . Arthritis   . Foot deformity     right  . CAD (coronary artery disease)     a. NSTEMI 02/2011: occ mid Cx, DES to OM2, residual nonobst LAD dz.  . Tobacco abuse   . Diabetes mellitus   . Atrial fibrillation     a. Dx 03/2011 - on tikosyn/coumadin.  . Anxiety   . Diverticulitis of colon 2008 and 04/2011  . Ureteral obstruction     History of gross hematuria/right hydronephrosis 2/2 to uteropelvic junction obstruction, s/p cystoscopy in January 2007 with bilateral retrograde pyelography, right ureter arthroscopy, right ureteral stent placement, bladder biopsies, stent removal since then.    Past Surgical History  Procedure Laterality Date  . Tonsillectomy and adenoidectomy    . Knee arthroscopy      left  . Tubal ligation    . Right leg surgery  age 79    As a child for  ?  scleroderma per patient  . Ureteral surgery      ROS:  As stated in the HPI and negative for all other systems.  PHYSICAL EXAM BP 158/74  Pulse 54  Ht 5\' 8"  (1.727 m)  Wt 188 lb (85.276 kg)  BMI 28.59 kg/m2 GENERAL: Well appearing  NECK: No jugular venous distention, waveform within normal limits, carotid upstroke brisk and symmetric, no bruits, no thyromegaly   LUNGS: Clear to auscultation bilaterally  CHEST: Unremarkable  HEART: PMI not displaced or sustained,S1 and S2 within normal limits, no S3, no S4, no clicks, no rubs, no murmurs  ABD: Flat, positive bowel sounds normal in frequency in pitch, no bruits, no rebound, no guarding, no midline pulsatile mass, no hepatomegaly, no splenomegaly  EXT: 2 plus pulses throughout, no edema, no cyanosis no , 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   12/16/2013  ASSESSMENT AND PLAN  HYPERTENSION -  Her BP has been well controlled on Norvasc.  Although it is elevated today it is not usually.  No change in therapy is indicated.   Atrial fibrillation -  She is maintaining sinus rhythm on Tikosyn and she will continue with this. Her recent electrolytes are OK.

## 2013-12-16 NOTE — Patient Instructions (Signed)
The current medical regimen is effective;  continue present plan and medications.  Follow up in 6 months with Dr Hochrein.  You will receive a letter in the mail 2 months before you are due.  Please call us when you receive this letter to schedule your follow up appointment.  

## 2013-12-27 ENCOUNTER — Encounter (HOSPITAL_COMMUNITY): Payer: Self-pay | Admitting: Emergency Medicine

## 2013-12-27 ENCOUNTER — Emergency Department (HOSPITAL_COMMUNITY)
Admission: EM | Admit: 2013-12-27 | Discharge: 2013-12-27 | Disposition: A | Discharge status: Home / Self Care | Payer: Medicare Other | Attending: Emergency Medicine | Admitting: Emergency Medicine

## 2013-12-27 DIAGNOSIS — M129 Arthropathy, unspecified: Secondary | ICD-10-CM | POA: Diagnosis not present

## 2013-12-27 DIAGNOSIS — Z7982 Long term (current) use of aspirin: Secondary | ICD-10-CM | POA: Diagnosis not present

## 2013-12-27 DIAGNOSIS — Z880 Allergy status to penicillin: Secondary | ICD-10-CM | POA: Insufficient documentation

## 2013-12-27 DIAGNOSIS — F411 Generalized anxiety disorder: Secondary | ICD-10-CM | POA: Insufficient documentation

## 2013-12-27 DIAGNOSIS — Z79899 Other long term (current) drug therapy: Secondary | ICD-10-CM | POA: Diagnosis not present

## 2013-12-27 DIAGNOSIS — E119 Type 2 diabetes mellitus without complications: Secondary | ICD-10-CM | POA: Insufficient documentation

## 2013-12-27 DIAGNOSIS — Z7901 Long term (current) use of anticoagulants: Secondary | ICD-10-CM | POA: Diagnosis not present

## 2013-12-27 DIAGNOSIS — K5732 Diverticulitis of large intestine without perforation or abscess without bleeding: Secondary | ICD-10-CM | POA: Diagnosis not present

## 2013-12-27 DIAGNOSIS — I252 Old myocardial infarction: Secondary | ICD-10-CM | POA: Insufficient documentation

## 2013-12-27 DIAGNOSIS — I2510 Atherosclerotic heart disease of native coronary artery without angina pectoris: Secondary | ICD-10-CM | POA: Insufficient documentation

## 2013-12-27 DIAGNOSIS — E785 Hyperlipidemia, unspecified: Secondary | ICD-10-CM | POA: Diagnosis not present

## 2013-12-27 DIAGNOSIS — Z87891 Personal history of nicotine dependence: Secondary | ICD-10-CM | POA: Insufficient documentation

## 2013-12-27 DIAGNOSIS — I4891 Unspecified atrial fibrillation: Secondary | ICD-10-CM | POA: Insufficient documentation

## 2013-12-27 DIAGNOSIS — R110 Nausea: Secondary | ICD-10-CM | POA: Diagnosis not present

## 2013-12-27 DIAGNOSIS — I10 Essential (primary) hypertension: Secondary | ICD-10-CM | POA: Insufficient documentation

## 2013-12-27 DIAGNOSIS — Z87448 Personal history of other diseases of urinary system: Secondary | ICD-10-CM | POA: Insufficient documentation

## 2013-12-27 DIAGNOSIS — K5792 Diverticulitis of intestine, part unspecified, without perforation or abscess without bleeding: Secondary | ICD-10-CM

## 2013-12-27 MED ORDER — METRONIDAZOLE 500 MG PO TABS
500.0000 mg | ORAL_TABLET | Freq: Once | ORAL | Status: AC
  Administered 2013-12-27: 500 mg via ORAL
  Filled 2013-12-27: qty 1

## 2013-12-27 MED ORDER — TRAMADOL HCL 50 MG PO TABS: 50.0000 mg | ORAL_TABLET | Freq: Four times a day (QID) | ORAL | Status: DC | PRN

## 2013-12-27 MED ORDER — METRONIDAZOLE 500 MG PO TABS: 500.0000 mg | ORAL_TABLET | Freq: Four times a day (QID) | ORAL | Status: DC

## 2013-12-27 MED ORDER — CIPROFLOXACIN HCL 250 MG PO TABS
500.0000 mg | ORAL_TABLET | Freq: Once | ORAL | Status: AC
  Administered 2013-12-27: 500 mg via ORAL
  Filled 2013-12-27: qty 2

## 2013-12-27 MED ORDER — CIPROFLOXACIN HCL 500 MG PO TABS: 500.0000 mg | ORAL_TABLET | Freq: Two times a day (BID) | ORAL | Status: DC

## 2013-12-27 NOTE — ED Provider Notes (Signed)
CSN: [id number]     Arrival date & time 12/27/13  1024 History  This chart was scribed for Nat Christen, MD, by Neta Ehlers, ED Scribe. This patient was seen in room APA06/APA06 and the patient's care was started at 11:26 AM.   First MD Initiated Contact with Patient 12/27/13 1046     Chief Complaint  Patient presents with  . Abdominal Pain    The history is provided by the patient. No language interpreter was used.   HPI Comments: Melody Casey is a 68 y.o. female, with a h/o diverticulitis, DM, and CAD,  who presents to the Emergency Department complaining of left-sided abdominal pain which began yesterday and worsened yesterday evening. She rates the pain as 6/10. She has also experienced a fever and nausea. The pt has treated her symptoms with IBU without relief.  She reports she has had approximately four episodes of diverticulitis in the past two years; she states she normally is treated by her PCP, Charleston Poot, in the office. The pt is a former smoker.    Past Medical History  Diagnosis Date  . HLD (hyperlipidemia)   . HTN (hypertension)   . Arthritis   . Foot deformity     right  . CAD (coronary artery disease)     a. NSTEMI 02/2011: occ mid Cx, DES to OM2, residual nonobst LAD dz.  . Tobacco abuse   . Diabetes mellitus   . Atrial fibrillation     a. Dx 03/2011 - on tikosyn/coumadin.  . Anxiety   . Diverticulitis of colon 2008 and 04/2011  . Ureteral obstruction     History of gross hematuria/right hydronephrosis 2/2 to uteropelvic junction obstruction, s/p cystoscopy in January 2007 with bilateral retrograde pyelography, right ureter arthroscopy, right ureteral stent placement, bladder biopsies, stent removal since then.  . Heart attack    Past Surgical History  Procedure Laterality Date  . Tonsillectomy and adenoidectomy    . Knee arthroscopy      left  . Tubal ligation    . Right leg surgery  age 60    As a child for  ?scleroderma per patient  . Ureteral surgery      Family History  Problem Relation Age of Onset  . Colon cancer Neg Hx   . Liver disease Neg Hx   . Lung cancer Father 29    deceased  . Heart failure Mother 44    deceased   History  Substance Use Topics  . Smoking status: Former Smoker -- 1.00 packs/day for 50 years    Quit date: 02/08/2011  . Smokeless tobacco: Not on file  . Alcohol Use: No   No OB history provided.  Review of Systems  Constitutional: Positive for fever (Resolved. ).  Gastrointestinal: Positive for nausea and abdominal pain.  All other systems reviewed and are negative.   Allergies  Acetaminophen; Oxycodone-acetaminophen; Penicillins; Prednisone; Miralax; and Sulfa antibiotics  Home Medications   Current Outpatient Rx  Name  Route  Sig  Dispense  Refill  . amLODipine (NORVASC) 2.5 MG tablet   Oral   Take 1 tablet (2.5 mg total) by mouth daily.   90 tablet   3   . aspirin 81 MG tablet   Oral   Take 81 mg by mouth every morning.          Marland Kitchen atorvastatin (LIPITOR) 40 MG tablet   Oral   Take 1.5 tablets (60 mg total) by mouth every evening.   135 tablet  3     Insurance gave quantity exception today   . dofetilide (TIKOSYN) 500 MCG capsule   Oral   Take 1 capsule (500 mcg total) by mouth 2 (two) times daily.   60 capsule   11   . ibuprofen (ADVIL,MOTRIN) 200 MG tablet   Oral   Take 200-400 mg by mouth daily as needed. For pain         . lisinopril (PRINIVIL,ZESTRIL) 40 MG tablet   Oral   Take 40 mg by mouth every morning.          . metFORMIN (GLUMETZA) 500 MG (MOD) 24 hr tablet   Oral   Take 500 mg by mouth every evening.          . niacin (NIASPAN) 500 MG CR tablet   Oral   Take 500 mg by mouth at bedtime.           . potassium chloride SA (K-DUR,KLOR-CON) 20 MEQ tablet   Oral   Take 20 mEq by mouth 2 (two) times daily.           Marland Kitchen warfarin (COUMADIN) 5 MG tablet   Oral   Take 5-7.5 mg by mouth every evening. Take 7.5 mg on wednesdays only.  Take 5 mg all  other days         . ALPRAZolam (XANAX) 0.25 MG tablet   Oral   Take 0.25 mg by mouth daily as needed for anxiety.          . ciprofloxacin (CIPRO) 500 MG tablet   Oral   Take 1 tablet (500 mg total) by mouth 2 (two) times daily.   20 tablet   0   . metroNIDAZOLE (FLAGYL) 500 MG tablet   Oral   Take 1 tablet (500 mg total) by mouth 4 (four) times daily. One po bid x 7 days   40 tablet   0   . nitroGLYCERIN (NITROSTAT) 0.4 MG SL tablet   Sublingual   Place 0.4 mg under the tongue every 5 (five) minutes as needed.           . traMADol (ULTRAM) 50 MG tablet   Oral   Take 1 tablet (50 mg total) by mouth every 6 (six) hours as needed.   20 tablet   0    Triage Vitals:  BP 117/62  Pulse 80  Temp(Src) 98.4 F (36.9 C) (Oral)  Resp 22  SpO2 97%  Physical Exam  Nursing note and vitals reviewed. Constitutional: She is oriented to person, place, and time. She appears well-developed and well-nourished.  HENT:  Head: Normocephalic and atraumatic.  Eyes: Conjunctivae and EOM are normal. Pupils are equal, round, and reactive to light.  Neck: Normal range of motion. Neck supple.  Cardiovascular: Normal rate, regular rhythm and normal heart sounds.   Pulmonary/Chest: Effort normal and breath sounds normal.  Abdominal: Soft. Bowel sounds are normal. There is tenderness.  LLQ tenderness.   Musculoskeletal: Normal range of motion.  Neurological: She is alert and oriented to person, place, and time.  Skin: Skin is warm and dry.  Psychiatric: She has a normal mood and affect. Her behavior is normal.    ED Course  Procedures (including critical care time)  DIAGNOSTIC STUDIES: Oxygen Saturation is 97% on room air, normal by my interpretation.    COORDINATION OF CARE:  11:33 AM- Discussed treatment plan with patient, which includes Cipro, Flagyl, and a prescription for Tramadol, and the patient agreed to the plan.  Labs Review Labs Reviewed - No data to display Imaging  Review No results found.  EKG Interpretation   None       MDM   1. Diverticulitis    No acute abdomen or clinical evidence of a perforation. Patient has had diverticulitis in the past. She is comfortable being treated with no testing.  Rx Cipro and Flagyl along with Ultram for pain. She has primary care followup.  I personally performed the services described in this documentation, which was scribed in my presence. The recorded information has been reviewed and is accurate.    Nat Christen, MD 12/27/13 1355

## 2013-12-27 NOTE — ED Notes (Signed)
Pt complain of pain in her left side that started yesterday. States she has diverticulitis and she is not going to be admitted. States all she needs is for her medication to be filled and a pain killer.

## 2013-12-27 NOTE — Discharge Instructions (Signed)
Diverticulitis °A diverticulum is a small pouch or sac on the colon. Diverticulosis is the presence of these diverticula on the colon. Diverticulitis is the irritation (inflammation) or infection of diverticula. °CAUSES  °The colon and its diverticula contain bacteria. If food particles block the tiny opening to a diverticulum, the bacteria inside can grow and cause an increase in pressure. This leads to infection and inflammation and is called diverticulitis. °SYMPTOMS  °· Abdominal pain and tenderness. Usually, the pain is located on the left side of your abdomen. However, it could be located elsewhere. °· Fever. °· Bloating. °· Feeling sick to your stomach (nausea). °· Throwing up (vomiting). °· Abnormal stools. °DIAGNOSIS  °Your caregiver will take a history and perform a physical exam. Since many things can cause abdominal pain, other tests may be necessary. Tests may include: °· Blood tests. °· Urine tests. °· X-ray of the abdomen. °· CT scan of the abdomen. °Sometimes, surgery is needed to determine if diverticulitis or other conditions are causing your symptoms. °TREATMENT  °Most of the time, you can be treated without surgery. Treatment includes: °· Resting the bowels by only having liquids for a few days. As you improve, you will need to eat a low-fiber diet. °· Intravenous (IV) fluids if you are losing body fluids (dehydrated). °· Antibiotic medicines that treat infections may be given. °· Pain and nausea medicine, if needed. °· Surgery if the inflamed diverticulum has burst. °HOME CARE INSTRUCTIONS  °· Try a clear liquid diet (broth, tea, or water for as long as directed by your caregiver). You may then gradually begin a low-fiber diet as tolerated.  °A low-fiber diet is a diet with less than 10 grams of fiber. Choose the foods below to reduce fiber in the diet: °· White breads, cereals, rice, and pasta. °· Cooked fruits and vegetables or soft fresh fruits and vegetables without the skin. °· Ground or  well-cooked tender beef, ham, veal, lamb, pork, or poultry. °· Eggs and seafood. °· After your diverticulitis symptoms have improved, your caregiver may put you on a high-fiber diet. A high-fiber diet includes 14 grams of fiber for every 1000 calories consumed. For a standard 2000 calorie diet, you would need 28 grams of fiber. Follow these diet guidelines to help you increase the fiber in your diet. It is important to slowly increase the amount fiber in your diet to avoid gas, constipation, and bloating. °· Choose whole-grain breads, cereals, pasta, and brown rice. °· Choose fresh fruits and vegetables with the skin on. Do not overcook vegetables because the more vegetables are cooked, the more fiber is lost. °· Choose more nuts, seeds, legumes, dried peas, beans, and lentils. °· Look for food products that have greater than 3 grams of fiber per serving on the Nutrition Facts label. °· Take all medicine as directed by your caregiver. °· If your caregiver has given you a follow-up appointment, it is very important that you go. Not going could result in lasting (chronic) or permanent injury, pain, and disability. If there is any problem keeping the appointment, call to reschedule. °SEEK MEDICAL CARE IF:  °· Your pain does not improve. °· You have a hard time advancing your diet beyond clear liquids. °· Your bowel movements do not return to normal. °SEEK IMMEDIATE MEDICAL CARE IF:  °· Your pain becomes worse. °· You have an oral temperature above 102° F (38.9° C), not controlled by medicine. °· You have repeated vomiting. °· You have bloody or black, tarry stools. °·   Symptoms that brought you to your caregiver become worse or are not getting better. MAKE SURE YOU:   Understand these instructions.  Will watch your condition.  Will get help right away if you are not doing well or get worse. Document Released: 09/05/2005 Document Revised: 02/18/2012 Document Reviewed: 01/01/2011 [facility] Patient Information  2014 [place].   Prescriptions for 2 antibiotics.    Also prescription for pain medicine.     Followup your primary care Dr.

## 2013-12-30 DIAGNOSIS — K5732 Diverticulitis of large intestine without perforation or abscess without bleeding: Secondary | ICD-10-CM | POA: Diagnosis not present

## 2013-12-30 DIAGNOSIS — R110 Nausea: Secondary | ICD-10-CM | POA: Diagnosis not present

## 2014-01-11 DIAGNOSIS — I739 Peripheral vascular disease, unspecified: Secondary | ICD-10-CM | POA: Diagnosis not present

## 2014-01-11 DIAGNOSIS — L84 Corns and callosities: Secondary | ICD-10-CM | POA: Diagnosis not present

## 2014-01-11 DIAGNOSIS — L608 Other nail disorders: Secondary | ICD-10-CM | POA: Diagnosis not present

## 2014-01-11 DIAGNOSIS — E1159 Type 2 diabetes mellitus with other circulatory complications: Secondary | ICD-10-CM | POA: Diagnosis not present

## 2014-01-14 DIAGNOSIS — R928 Other abnormal and inconclusive findings on diagnostic imaging of breast: Secondary | ICD-10-CM | POA: Diagnosis not present

## 2014-01-18 ENCOUNTER — Telehealth: Payer: Self-pay | Admitting: Cardiology

## 2014-01-18 DIAGNOSIS — I4891 Unspecified atrial fibrillation: Secondary | ICD-10-CM | POA: Diagnosis not present

## 2014-01-18 DIAGNOSIS — Z5181 Encounter for therapeutic drug level monitoring: Secondary | ICD-10-CM | POA: Diagnosis not present

## 2014-01-18 DIAGNOSIS — Z7901 Long term (current) use of anticoagulants: Secondary | ICD-10-CM | POA: Diagnosis not present

## 2014-01-18 DIAGNOSIS — H6120 Impacted cerumen, unspecified ear: Secondary | ICD-10-CM | POA: Diagnosis not present

## 2014-01-18 NOTE — Telephone Encounter (Signed)
Will forward to Dr Percival Spanish for review and orders

## 2014-01-18 NOTE — Telephone Encounter (Signed)
New message    They need to know if pt can stop coumadin 4 days prior to a breast biopsy.

## 2014-01-19 ENCOUNTER — Telehealth: Payer: Self-pay | Admitting: Cardiology

## 2014-01-19 NOTE — Telephone Encounter (Signed)
Spoke with pt, aware of dr hochrein recommendations. Spoke with Janett Billow at [facility], they are aware it is okay for the pt to hold her coumadin 4 days prior to the biopsy. They will call the pt to reschedule.

## 2014-01-19 NOTE — Telephone Encounter (Signed)
Please see other phone note about this -

## 2014-01-19 NOTE — Telephone Encounter (Signed)
OK to hold warfarin as needed for breast biopsy.

## 2014-01-19 NOTE — Telephone Encounter (Signed)
Think there is already another telephone message about this in Dr Hochrein's in [place] but the pt needs to know ASAP!

## 2014-01-19 NOTE — Telephone Encounter (Signed)
New message  Patient is having breast biopsy tomorrow and needs to know if she needs to stop aspirin and coumadin? Please call and advise.

## 2014-01-28 ENCOUNTER — Other Ambulatory Visit: Payer: Self-pay | Admitting: Radiology

## 2014-01-28 DIAGNOSIS — D0590 Unspecified type of carcinoma in situ of unspecified breast: Secondary | ICD-10-CM | POA: Diagnosis not present

## 2014-01-28 DIAGNOSIS — Z0189 Encounter for other specified special examinations: Secondary | ICD-10-CM | POA: Diagnosis not present

## 2014-01-28 DIAGNOSIS — C50919 Malignant neoplasm of unspecified site of unspecified female breast: Secondary | ICD-10-CM | POA: Diagnosis not present

## 2014-01-29 ENCOUNTER — Other Ambulatory Visit: Payer: Self-pay | Admitting: Radiology

## 2014-01-29 DIAGNOSIS — D0511 Intraductal carcinoma in situ of right breast: Secondary | ICD-10-CM

## 2014-02-01 ENCOUNTER — Telehealth: Payer: Self-pay | Admitting: *Deleted

## 2014-02-01 ENCOUNTER — Ambulatory Visit
Admission: RE | Admit: 2014-02-01 | Discharge: 2014-02-01 | Disposition: A | Discharge status: Home / Self Care | Payer: Medicare Other | Source: Ambulatory Visit | Attending: Radiology | Admitting: Radiology

## 2014-02-01 ENCOUNTER — Other Ambulatory Visit: Payer: Self-pay | Admitting: Radiology

## 2014-02-01 DIAGNOSIS — D0511 Intraductal carcinoma in situ of right breast: Secondary | ICD-10-CM

## 2014-02-01 DIAGNOSIS — D0590 Unspecified type of carcinoma in situ of unspecified breast: Secondary | ICD-10-CM | POA: Diagnosis not present

## 2014-02-01 DIAGNOSIS — Z170 Estrogen receptor positive status [ER+]: Secondary | ICD-10-CM

## 2014-02-01 DIAGNOSIS — Z01818 Encounter for other preprocedural examination: Secondary | ICD-10-CM | POA: Diagnosis not present

## 2014-02-01 DIAGNOSIS — C50411 Malignant neoplasm of upper-outer quadrant of right female breast: Secondary | ICD-10-CM

## 2014-02-01 MED ORDER — GADOBENATE DIMEGLUMINE 529 MG/ML IV SOLN
17.0000 mL | Freq: Once | INTRAVENOUS | Status: AC | PRN
  Administered 2014-02-01: 17 mL via INTRAVENOUS

## 2014-02-01 NOTE — Telephone Encounter (Signed)
Confirmed [facility] for 02/03/14 at 1230pm .  Instructions and contact information given.

## 2014-02-02 ENCOUNTER — Telehealth: Payer: Self-pay | Admitting: *Deleted

## 2014-02-02 NOTE — Telephone Encounter (Signed)
Confirmed new [facility] appt for 02/10/14 at 1200 .  Instructions and contact information given.

## 2014-02-03 ENCOUNTER — Ambulatory Visit: Payer: Medicare Other

## 2014-02-03 ENCOUNTER — Other Ambulatory Visit: Payer: Self-pay | Admitting: Radiology

## 2014-02-03 ENCOUNTER — Ambulatory Visit: Payer: Medicare Other | Admitting: Oncology

## 2014-02-03 ENCOUNTER — Ambulatory Visit: Payer: Medicare Other | Admitting: Radiation Oncology

## 2014-02-03 ENCOUNTER — Other Ambulatory Visit: Payer: Medicare Other

## 2014-02-03 DIAGNOSIS — Z0189 Encounter for other specified special examinations: Secondary | ICD-10-CM | POA: Diagnosis not present

## 2014-02-03 DIAGNOSIS — Z09 Encounter for follow-up examination after completed treatment for conditions other than malignant neoplasm: Secondary | ICD-10-CM | POA: Diagnosis not present

## 2014-02-03 DIAGNOSIS — D249 Benign neoplasm of unspecified breast: Secondary | ICD-10-CM | POA: Diagnosis not present

## 2014-02-03 DIAGNOSIS — R928 Other abnormal and inconclusive findings on diagnostic imaging of breast: Secondary | ICD-10-CM | POA: Diagnosis not present

## 2014-02-05 ENCOUNTER — Other Ambulatory Visit: Payer: Self-pay | Admitting: Radiology

## 2014-02-05 DIAGNOSIS — R928 Other abnormal and inconclusive findings on diagnostic imaging of breast: Secondary | ICD-10-CM

## 2014-02-07 ENCOUNTER — Other Ambulatory Visit: Payer: Self-pay | Admitting: Cardiology

## 2014-02-08 ENCOUNTER — Telehealth: Payer: Self-pay | Admitting: *Deleted

## 2014-02-08 NOTE — Telephone Encounter (Signed)
Received call from patient stating she wants to wait until all biopsies are complete before coming to clinic.  Informed patient she could still come to [facility] even though we will not have the results of her biopsy on 02/10/14.  Patient wants to wait for 02/17/14 clinic.  Appts. Made and pt. Aware.

## 2014-02-10 ENCOUNTER — Ambulatory Visit
Admission: RE | Admit: 2014-02-10 | Discharge: 2014-02-10 | Disposition: A | Discharge status: Home / Self Care | Payer: Medicare Other | Source: Ambulatory Visit | Attending: Radiology | Admitting: Radiology

## 2014-02-10 ENCOUNTER — Ambulatory Visit: Payer: Medicare Other

## 2014-02-10 ENCOUNTER — Other Ambulatory Visit: Payer: Medicare Other

## 2014-02-10 ENCOUNTER — Ambulatory Visit: Payer: Medicare Other | Admitting: Oncology

## 2014-02-10 DIAGNOSIS — N6089 Other benign mammary dysplasias of unspecified breast: Secondary | ICD-10-CM | POA: Diagnosis not present

## 2014-02-10 DIAGNOSIS — R928 Other abnormal and inconclusive findings on diagnostic imaging of breast: Secondary | ICD-10-CM

## 2014-02-10 DIAGNOSIS — N6029 Fibroadenosis of unspecified breast: Secondary | ICD-10-CM | POA: Diagnosis not present

## 2014-02-10 DIAGNOSIS — N6019 Diffuse cystic mastopathy of unspecified breast: Secondary | ICD-10-CM | POA: Diagnosis not present

## 2014-02-10 DIAGNOSIS — N63 Unspecified lump in unspecified breast: Secondary | ICD-10-CM | POA: Diagnosis not present

## 2014-02-10 DIAGNOSIS — D0590 Unspecified type of carcinoma in situ of unspecified breast: Secondary | ICD-10-CM | POA: Diagnosis not present

## 2014-02-10 MED ORDER — GADOBENATE DIMEGLUMINE 529 MG/ML IV SOLN
17.0000 mL | Freq: Once | INTRAVENOUS | Status: AC | PRN
  Administered 2014-02-10: 17 mL via INTRAVENOUS

## 2014-02-17 ENCOUNTER — Telehealth: Payer: Self-pay | Admitting: Cardiology

## 2014-02-17 ENCOUNTER — Ambulatory Visit: Payer: Medicare Other | Attending: General Surgery | Admitting: Physical Therapy

## 2014-02-17 ENCOUNTER — Ambulatory Visit: Payer: Medicare Other

## 2014-02-17 ENCOUNTER — Ambulatory Visit
Admission: RE | Admit: 2014-02-17 | Discharge: 2014-02-17 | Disposition: A | Discharge status: Home / Self Care | Payer: Medicare Other | Source: Ambulatory Visit | Attending: Radiation Oncology | Admitting: Radiation Oncology

## 2014-02-17 ENCOUNTER — Other Ambulatory Visit (HOSPITAL_BASED_OUTPATIENT_CLINIC_OR_DEPARTMENT_OTHER): Payer: Medicare Other

## 2014-02-17 ENCOUNTER — Ambulatory Visit (HOSPITAL_BASED_OUTPATIENT_CLINIC_OR_DEPARTMENT_OTHER): Payer: Medicare Other | Admitting: General Surgery

## 2014-02-17 ENCOUNTER — Encounter: Payer: Self-pay | Admitting: Oncology

## 2014-02-17 ENCOUNTER — Ambulatory Visit (HOSPITAL_BASED_OUTPATIENT_CLINIC_OR_DEPARTMENT_OTHER): Payer: Medicare Other | Admitting: Oncology

## 2014-02-17 ENCOUNTER — Encounter: Payer: Self-pay | Admitting: Radiation Oncology

## 2014-02-17 VITALS — BP 166/78 | HR 64 | Temp 98.1°F | Resp 20 | Ht 67.25 in | Wt 181.4 lb

## 2014-02-17 DIAGNOSIS — F32A Depression, unspecified: Secondary | ICD-10-CM

## 2014-02-17 DIAGNOSIS — C50411 Malignant neoplasm of upper-outer quadrant of right female breast: Secondary | ICD-10-CM

## 2014-02-17 DIAGNOSIS — C50419 Malignant neoplasm of upper-outer quadrant of unspecified female breast: Secondary | ICD-10-CM | POA: Diagnosis not present

## 2014-02-17 DIAGNOSIS — M8580 Other specified disorders of bone density and structure, unspecified site: Secondary | ICD-10-CM

## 2014-02-17 DIAGNOSIS — Z79899 Other long term (current) drug therapy: Secondary | ICD-10-CM

## 2014-02-17 DIAGNOSIS — IMO0001 Reserved for inherently not codable concepts without codable children: Secondary | ICD-10-CM | POA: Insufficient documentation

## 2014-02-17 DIAGNOSIS — Z170 Estrogen receptor positive status [ER+]: Secondary | ICD-10-CM

## 2014-02-17 DIAGNOSIS — R293 Abnormal posture: Secondary | ICD-10-CM | POA: Insufficient documentation

## 2014-02-17 DIAGNOSIS — Z8739 Personal history of other diseases of the musculoskeletal system and connective tissue: Secondary | ICD-10-CM | POA: Insufficient documentation

## 2014-02-17 DIAGNOSIS — F329 Major depressive disorder, single episode, unspecified: Secondary | ICD-10-CM

## 2014-02-17 DIAGNOSIS — I4891 Unspecified atrial fibrillation: Secondary | ICD-10-CM

## 2014-02-17 LAB — CBC WITH DIFFERENTIAL/PLATELET
BASO%: 0.8 % (ref 0.0–2.0)
BASOS ABS: 0 10*3/uL (ref 0.0–0.1)
EOS%: 1.6 % (ref 0.0–7.0)
Eosinophils Absolute: 0.1 10*3/uL (ref 0.0–0.5)
HEMATOCRIT: 36.6 % (ref 34.8–46.6)
HEMOGLOBIN: 11.8 g/dL (ref 11.6–15.9)
LYMPH#: 1.5 10*3/uL (ref 0.9–3.3)
LYMPH%: 31.8 % (ref 14.0–49.7)
MCH: 25.8 pg (ref 25.1–34.0)
MCHC: 32.2 g/dL (ref 31.5–36.0)
MCV: 80.2 fL (ref 79.5–101.0)
MONO#: 0.5 10*3/uL (ref 0.1–0.9)
MONO%: 10.3 % (ref 0.0–14.0)
NEUT#: 2.6 10*3/uL (ref 1.5–6.5)
NEUT%: 55.5 % (ref 38.4–76.8)
PLATELETS: 187 10*3/uL (ref 145–400)
RBC: 4.56 10*6/uL (ref 3.70–5.45)
RDW: 20.5 % — ABNORMAL HIGH (ref 11.2–14.5)
WBC: 4.7 10*3/uL (ref 3.9–10.3)

## 2014-02-17 LAB — COMPREHENSIVE METABOLIC PANEL (CC13)
ALT: 22 U/L (ref 0–55)
AST: 22 U/L (ref 5–34)
Albumin: 3.8 g/dL (ref 3.5–5.0)
Alkaline Phosphatase: 122 U/L (ref 40–150)
Anion Gap: 8 mEq/L (ref 3–11)
BILIRUBIN TOTAL: 0.39 mg/dL (ref 0.20–1.20)
BUN: 15.6 mg/dL (ref 7.0–26.0)
CHLORIDE: 108 meq/L (ref 98–109)
CO2: 24 mEq/L (ref 22–29)
CREATININE: 0.8 mg/dL (ref 0.6–1.1)
Calcium: 10.1 mg/dL (ref 8.4–10.4)
Glucose: 103 mg/dl (ref 70–140)
Potassium: 4.6 mEq/L (ref 3.5–5.1)
Sodium: 140 mEq/L (ref 136–145)
Total Protein: 8.1 g/dL (ref 6.4–8.3)

## 2014-02-17 NOTE — Progress Notes (Signed)
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 female who was found on screening mammography to have new calcifications in the right breast.  Biopsy of the area of calcifications revealed IDC, ER/PR+, Her2 (-), Grade 1, Ki67 26%. MRI was performed of her breasts revealing no clear abnormality in the area of prior biopsy; however, apart from the biopsy site, in the lower inner quadrant, there was a 7 mm mass. This was biopsied and it revealed atypical ductal hyperplasia. Dr. Excell Seltzer plans to perform double lumpectomy to remove both the area of invasive ductal carcinoma as well as the lesion associated with the atypical ductal hyperplasia. She is otherwise in her usual state of health.   PREVIOUS RADIATION THERAPY: No  PAST MEDICAL HISTORY:  has a past medical history of HLD (hyperlipidemia); HTN (hypertension); Arthritis; Foot deformity; CAD (coronary artery disease); Tobacco abuse; Diabetes mellitus; Atrial fibrillation; Anxiety; Diverticulitis of colon (2008 and 04/2011); Ureteral obstruction; and Heart attack.   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 She denies any other symptoms from scleroderma  PAST SURGICAL HISTORY: Past Surgical History  Procedure Laterality Date  . Tonsillectomy and adenoidectomy    . Knee arthroscopy      left  . Tubal ligation    . Right leg surgery  age 32    As a child for  ?scleroderma per patient  . Ureteral surgery      FAMILY HISTORY: family history includes Heart failure (age of onset: 53) in her mother; Lung cancer (age of onset: 6) in her  father. There is no history of Colon cancer or Liver disease.  SOCIAL HISTORY:  reports that she quit smoking about 3 years ago. She does not have any smokeless tobacco history on file. She reports that she does not drink alcohol or use illicit drugs.  ALLERGIES: Acetaminophen; Oxycodone-acetaminophen; Penicillins; Prednisone; Miralax; and Sulfa antibiotics  MEDICATIONS:  Current Outpatient Prescriptions  Medication Sig Dispense Refill  . ALPRAZolam (XANAX) 0.25 MG tablet Take 0.25 mg by mouth daily as needed for anxiety.       Marland Kitchen amLODipine (NORVASC) 2.5 MG tablet Take 1 tablet (2.5 mg total) by mouth daily.  90 tablet  3  . aspirin 81 MG tablet Take 81 mg by mouth every morning.       Marland Kitchen atorvastatin (LIPITOR) 40 MG tablet TAKE ONE AND ONE-HALF TABLETS BY MOUTH IN THE EVENING  135 tablet  0  . ciprofloxacin (CIPRO) 500 MG tablet Take 1 tablet (500 mg total) by mouth 2 (two) times daily.  20 tablet  0  . dofetilide (TIKOSYN) 500 MCG capsule Take 1 capsule (500 mcg total) by mouth 2 (two) times daily.  60 capsule  11  . ibuprofen (ADVIL,MOTRIN) 200 MG tablet Take 200-400 mg by mouth daily as needed. For pain      . lisinopril (PRINIVIL,ZESTRIL) 40 MG tablet Take 40 mg by mouth every morning.       . metFORMIN (GLUMETZA) 500 MG (MOD) 24 hr tablet Take 500 mg by mouth every evening.       Marland Kitchen  metroNIDAZOLE (FLAGYL) 500 MG tablet Take 1 tablet (500 mg total) by mouth 4 (four) times daily. One po bid x 7 days  40 tablet  0  . niacin (NIASPAN) 500 MG CR tablet Take 500 mg by mouth at bedtime.        . nitroGLYCERIN (NITROSTAT) 0.4 MG SL tablet Place 0.4 mg under the tongue every 5 (five) minutes as needed.        . potassium chloride SA (K-DUR,KLOR-CON) 20 MEQ tablet Take 20 mEq by mouth 2 (two) times daily.        . traMADol (ULTRAM) 50 MG tablet Take 1 tablet (50 mg total) by mouth every 6 (six) hours as needed.  20 tablet  0  . warfarin (COUMADIN) 5 MG tablet Take 5-7.5 mg by mouth every evening.  Take 7.5 mg on wednesdays only.  Take 5 mg all other days       No current facility-administered medications for this encounter.    REVIEW OF SYSTEMS:  Notable for that above.   PHYSICAL EXAM:    Vitals with Age-Percentiles 02/17/2014  Length 983.3 cm  Systolic 825  Diastolic 78  Pulse 64  Respiration 20  Weight 82.283 kg  BMI 28.3  VISIT REPORT   General: Alert and oriented, in no acute distress HEENT: Head is normocephalic. Neck: Neck is supple, no palpable cervical or supraclavicular lymphadenopathy. Heart: Regular in rate and rhythm with no murmurs, rubs, or gallops. Chest: Clear to auscultation bilaterally, with no rhonchi, wheezes, or rales. Abdomen: Soft, nontender, nondistended, with no rigidity or guarding. Extremities: No cyanosis or edema. Right lower leg muscle atrophy Neurologic: Cranial nerves II through XII are grossly intact. No obvious focalities. Speech is fluent. Coordination is intact. Psychiatric: Judgment and insight are intact. Affect is appropriate. BREASTS: Bilaterally, no axillary adenopathy is appreciated. Right breast is notable for ecchymoses associated with her biopsies. No obvious palpable masses appreciated in either breast   ECOG = 0  0 - Asymptomatic (Fully active, able to carry on all predisease activities without restriction)  1 - Symptomatic but completely ambulatory (Restricted in physically strenuous activity but ambulatory and able to carry out work of a light or sedentary nature. For example, light housework, office work)  2 - Symptomatic, <50% in bed during the day (Ambulatory and capable of all self care but unable to carry out any work activities. Up and about more than 50% of waking hours)  3 - Symptomatic, >50% in bed, but not bedbound (Capable of only limited self-care, confined to bed or chair 50% or more of waking hours)  4 - Bedbound (Completely disabled. Cannot carry on any self-care. Totally confined to bed or chair)  5 -  Death   Eustace Pen MM, Creech RH, Tormey DC, et al. [phone]). "Toxicity and response criteria of the [facility]". [place] Oncol. 5 (6): 649-55   LABORATORY DATA:  Lab Results  Component Value Date   WBC 4.7 02/17/2014   HGB 11.8 02/17/2014   HCT 36.6 02/17/2014   MCV 80.2 02/17/2014   PLT 187 02/17/2014   CMP     Component Value Date/Time   NA 140 02/17/2014 1206   NA 138 10/24/2012 1450   K 4.6 02/17/2014 1206   K 4.6 10/24/2012 1450   CL 104 10/24/2012 1450   CO2 24 02/17/2014 1206   CO2 26 10/24/2012 1450   GLUCOSE 103 02/17/2014 1206   GLUCOSE 110* 10/24/2012 1450   BUN 15.6 02/17/2014 1206  BUN 15 10/24/2012 1450   CREATININE 0.8 02/17/2014 1206   CREATININE 0.69 10/24/2012 1450   CREATININE 0.57 05/23/2011 1300   CALCIUM 10.1 02/17/2014 1206   CALCIUM 9.5 10/24/2012 1450   PROT 8.1 02/17/2014 1206   PROT 7.5 10/24/2012 1450   ALBUMIN 3.8 02/17/2014 1206   ALBUMIN 3.5 10/24/2012 1450   AST 22 02/17/2014 1206   AST 21 10/24/2012 1450   ALT 22 02/17/2014 1206   ALT 18 10/24/2012 1450   ALKPHOS 122 02/17/2014 1206   ALKPHOS 125* 10/24/2012 1450   BILITOT 0.39 02/17/2014 1206   BILITOT 0.4 10/24/2012 1450   GFRNONAA 87* 10/24/2012 1450   GFRAA >90 10/24/2012 1450         RADIOGRAPHY: Mr Breast Bilateral W Wo Contrast  02/01/2014   CLINICAL DATA:  Recently diagnosed right breast DCIS on stereotactic core biopsy performed at [facility]. Preoperative evaluation.  EXAM: BILATERAL BREAST MRI WITH AND WITHOUT CONTRAST  LABS:  BUN and creatinine were obtained on site at [facility] at  [address]  Results:  BUN 21 mg/dL,  Creatinine 0.6 mg/dL.  TECHNIQUE: Multiplanar, multisequence MR images of both breasts were obtained prior to and following the intravenous administration of 16m of MultiHance  THREE-DIMENSIONAL MR IMAGE RENDERING ON INDEPENDENT WORKSTATION:  Three-dimensional MR images were rendered by post-processing of the original MR data on  an independent workstation. The three-dimensional MR images were interpreted, and findings are reported in the following complete MRI report for this study. Three dimensional images were evaluated at the independent DynaCad workstation  COMPARISON:  Mammograms from [facility] 01/28/2014, 01/14/2014, 01/14/2013, 07/08/2012, 01/02/2012.  FINDINGS: Breast composition: c:  Heterogeneous fibroglandular tissue  Background parenchymal enhancement: Moderate  Right breast: In the area of the known DCIS there is no abnormal enhancement. There is a slightly irregular, oval, enhancing mass located within the lower inner quadrant of the right breast (at the junction of the anterior and middle 1/3) which measures 7 x 6 x 5 mm in size and is associated with plateau enhancement kinetics. This is located approximately 2.5 cm inferior and medial to the clip related to the recent stereotactic core biopsy. There are no additional areas of abnormal enhancement within the right breast.  Left breast: No mass or abnormal enhancement.  Lymph nodes: No abnormal appearing lymph nodes.  Ancillary findings:  None.  IMPRESSION: 1. No abnormal enhancement associated with the known DCIS in the area stereotactic core biopsy.  2. 7 mm enhancing mass located within the lower inner quadrant of the right breast (this is approximately 2.5 cm inferior and medial to the clip related to the recent stereotactic core biopsy). If breast conservation is planned, recommend MR guided core biopsy of this area.  RECOMMENDATION: Right breast MR guided core biopsy of the 7 mm enhancing mass located within the lower inner quadrant of the right as discussed above (if breast conservation is planned). I have discussed the findings with Dr. CMarcelo Baldy  BI-RADS CATEGORY  4: Suspicious abnormality - biopsy should be considered.   Electronically Signed   By: RLuberta RobertsonM.D.   On: 02/01/2014 10:58   Mm Digital Diagnostic Unilat R  02/10/2014   CLINICAL DATA:   Recent diagnosis of DCIS in the upper right breast at stereotactic biopsy. She had a 7 mm enhancing mass in the inner right breast on pre-treatment MRI. MRI guided core needle biopsy of this mass was performed earlier today.  EXAM: POST-BIOPSY CLIP PLACEMENT RIGHT DIAGNOSTIC  MAMMOGRAM  COMPARISON:  Previous exams.  FINDINGS: Films are performed following MRI guided biopsy of a 7 mm enhancing mass in the inner right breast. The dumbbell-shaped tissue marker clip is positioned in the inner right breast, middle 1/3, corresponding to the area of concern on the MRI. There is a fluid-filled cavity with an air bubble at the biopsy site, so there may have been slight medial migration of the clip. The dumbbell-shaped clip is approximately 4.0-4.5 cm from the coil shaped clip placed at the time of stereotactic biopsy.  IMPRESSION: Appropriate positioning of the dumbbell-shaped tissue marker clip in the inner right breast, middle 1/3, at the site of the mass on the MRI. The dumbbell-shaped clip is approximately 4.0-4.5 cm from the coil shaped clip placed at the time of stereotactic biopsy.  Final Assessment: Post Procedure Mammograms for Marker Placement   Electronically Signed   By: Evangeline Dakin M.D.   On: 02/10/2014 10:30   Mr Rt Breast Bx Johnella Moloney Dev 1st Lesion Image Bx Spec Mr Guide  02/11/2014   ADDENDUM REPORT: 02/11/2014 10:52  ADDENDUM: Pathology results have become available and indicate fibrocystic change and adenosis with atypical ductal hyperplasia. Therefore, excisional biopsy of this area is recommended. I gave these results and recommendations to the patient by telephone at 10:30 a.m. on 02/11/2014. The patient has a pending appointment with the multidisciplinary clinic on March 11, 2,015. She reports no complications or complaints related to the biopsy.   Electronically Signed   By: Skipper Cliche M.D.   On: 02/11/2014 10:52   02/11/2014   CLINICAL DATA:  Recent diagnosis of DCIS in the upper right breast  on stereotactic biopsy. Indeterminate 7 mm enhancing mass in the inner right breast on the pre-treatment MRI.  LABS:  BUN and creatinine were obtained on site at [facility] at [address]  Results: BUN 21 mg/dL, Creatinine 0.6 mg/dL, estimated GFR greater than 60.  EXAM: MRI GUIDED CORE NEEDLE BIOPSY OF THE RIGHT BREAST  TECHNIQUE: Multiplanar, multisequence MR imaging of the right breast was performed both before and after administration of intravenous contrast.  CONTRAST:  17 cc Multihance IV.  COMPARISON:  Bilateral breast MRI 02/01/2014.  FINDINGS: I met with the patient, and we discussed the procedure of MRI guided biopsy, including risks, benefits, and alternatives. Specifically, we discussed the risks of infection, bleeding, tissue injury, clip migration, and inadequate sampling. Informed, written consent was given. The usual time out protocol was performed immediately prior to the procedure.  Using sterile technique, 2% Lidocaine, MRI guidance, and an automated 9 gauge vacuum assisted device, biopsy was performed of the 7 mm mass in the inner right breast, middle 1/3, using a medial approach. At the conclusion of the procedure, a dumbbell shaped tissue marker clip was deployed into the biopsy cavity. Follow-up 2-view mammogram was performed and dictated separately.  IMPRESSION: MRI guided biopsy of an indeterminate 7 mm mass in the inner right breast. No apparent complications.  Electronically Signed: By: Evangeline Dakin M.D. On: 02/10/2014 10:26      IMPRESSION/PLAN:She has been discussed at our multidisciplinary tumor board.  The consensus is that she would  be a good candidate for breast conservation. I talked to her about the option of a mastectomy and informed her that her expected overall survival would be equivalent between mastectomy and breast conservation, based upon randomized controlled data. She is enthusiastic about breast conservation.  For the patient's early stage  favorable risk breast cancer, we  had a thorough discussion about her options for adjuvant therapy. One option would be antiestrogen therapy as discussed with medical oncology. She would take a pill for approximately 5 years. The alternative option (but less standard) would be radiotherapy to the breast. The most aggressive option would be to pursue both modalities.    Of note, I discussed the data from the [employer] al trial in the [place] [employer]. She understands that tamoxifen compared to radiation plus tamoxifen demonstrated no survival benefit among the women in this study. The women were 32 years or older with stage I estrogen receptor positive breast cancer. Based on this study, I told Ms. Hilarie Fredrickson that her overall life expectancy should not be affected by adding radiotherapy to antiestrogen medication. She understands that the main benefit of  adding radiotherapy to anti estrogen therapy would be a very small but measurable local control benefit (risk of local recurrence to be lowered from ~9% --> ~2% over a decade).  We discussed the fact that radiotherapy only provides a local control benefit while anti-estrogen pills provide systemic coverage. That being said, the risk of systemic failure is relatively low with her type of breast cancer.  I did acknowledge that sometimes patients with scleroderma and have heightened side effects from radiotherapy. (Based on a institutional review by the [facility], most scleroderma patients do well with radiotherapy, however). This is something to factor into her decision.  She would like to think about the side effects of antiestrogen therapy and radiation therapy before making her decision.   We discussed the risks benefits and side effects of radiotherapy. She understands that the side effects would likely include some skin irritation and fatigue during the weeks of radiation. There is a risk of late effects which include but are not necessarily  limited to cosmetic changes and rare lung toxicity. I would anticipate delivering approximately 4-5 weeks of radiotherapy.  After a thorough discussion, the patient would like to think about her options.  I asked our patient navigator to schedule a post-op followup with me so we can review her pathology and discuss her options further.    __________________________________________   [name], MD

## 2014-02-17 NOTE — Progress Notes (Signed)
Chief Complaint: New diagnosis of breast cancer  History:    Melody Casey is a 68 y.o. postmenopausal female referred by Dr. 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 She recently presented for a screening mamogram revealing a new small cluster of heterogeneous calcifications in the right breast at the 12:00 position suspicious for malignancy..  Subsequent imaging included diagnostic mamogram showing confirmation of these findings.   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 At that time some additional calcifications were seen in the right breast and large core needle biopsy set was performed on February 25 showing benign breast parenchyma with calcifications. Subsequent breast MR was performed showing no abnormal enhancement in the area of no malignancy but a 7 mm enhancing mass in the lower inner quadrant of the right breast 2.5 cm inferior and medial to the clip from stereotactic biopsy. Subsequent MR guided biopsy of this abnormality was performed. A dumbbell clip was placed. Pathology here revealed atypical ductal hyperplasia. She is seen now in multidisciplinary breast clinic for initial treatment planning.  She has experienced no unusual symptoms such as lump, nipple discharge or skin changes..  She does not have a personal history of any previous breast problems.  Findings at that time were the following:  Tumor size: less than 1  cm  Tumor grade: 1  Estrogen Receptor: positive Progesterone Receptor: positive  Her-2 neu: negative  Lymph node status: negative Neurovascular invasion: no Lymphatic invasion: no  Past Medical History  Diagnosis Date  . HLD (hyperlipidemia)   . HTN (hypertension)   . Arthritis   . Foot deformity     right  . CAD (coronary artery disease)     a. NSTEMI 02/2011: occ mid Cx, DES to OM2, residual nonobst LAD dz.  . Tobacco abuse   .  Diabetes mellitus   . Atrial fibrillation     a. Dx 03/2011 - on tikosyn/coumadin.  . Anxiety   . Diverticulitis of colon 2008 and 04/2011  . Ureteral obstruction     History of gross hematuria/right hydronephrosis 2/2 to uteropelvic junction obstruction, s/p cystoscopy in January 2007 with bilateral retrograde pyelography, right ureter arthroscopy, right ureteral stent placement, bladder biopsies, stent removal since then.  . Heart attack     Past Surgical History  Procedure Laterality Date  . Tonsillectomy and adenoidectomy    . Knee arthroscopy      left  . Tubal ligation    . Right leg surgery  age 38    As a child for  ?scleroderma per patient  . Ureteral surgery    . Coronary stent placement      Current Outpatient Prescriptions  Medication Sig Dispense Refill  . ALPRAZolam (XANAX) 0.25 MG tablet Take 0.25 mg by mouth daily as needed for anxiety.       Marland Kitchen amLODipine (NORVASC) 2.5 MG tablet Take 1 tablet (2.5 mg total) by mouth daily.  90 tablet  3  . aspirin 81 MG tablet Take 81 mg by mouth every morning.       Marland Kitchen atorvastatin (LIPITOR) 40 MG tablet TAKE ONE AND ONE-HALF TABLETS BY MOUTH IN THE EVENING  135 tablet  0  . ciprofloxacin (CIPRO) 500 MG tablet Take 1 tablet (500 mg total) by mouth 2 (two) times daily.  20 tablet  0  . dofetilide (TIKOSYN) 500 MCG capsule Take 1 capsule (500 mcg total) by mouth 2 (two)  times daily.  60 capsule  11  . ibuprofen (ADVIL,MOTRIN) 200 MG tablet Take 200-400 mg by mouth daily as needed. For pain      . lisinopril (PRINIVIL,ZESTRIL) 40 MG tablet Take 40 mg by mouth every morning.       . metFORMIN (GLUMETZA) 500 MG (MOD) 24 hr tablet Take 500 mg by mouth every evening.       . metroNIDAZOLE (FLAGYL) 500 MG tablet Take 1 tablet (500 mg total) by mouth 4 (four) times daily. One po bid x 7 days  40 tablet  0  . niacin (NIASPAN) 500 MG CR tablet Take 500 mg by mouth at bedtime.        . nitroGLYCERIN (NITROSTAT) 0.4 MG SL tablet Place 0.4 mg under  the tongue every 5 (five) minutes as needed.        . potassium chloride SA (K-DUR,KLOR-CON) 20 MEQ tablet Take 20 mEq by mouth 2 (two) times daily.        . traMADol (ULTRAM) 50 MG tablet Take 1 tablet (50 mg total) by mouth every 6 (six) hours as needed.  20 tablet  0  . warfarin (COUMADIN) 5 MG tablet Take 5-7.5 mg by mouth every evening. Take 7.5 mg on wednesdays only.  Take 5 mg all other days       No current facility-administered medications for this visit.    Family History  Problem Relation Age of Onset  . Colon cancer Neg Hx   . Liver disease Neg Hx   . Lung cancer Father 57    deceased  . Heart failure Mother 96    deceased    History   Social History  . Marital Status: Married    Spouse Name: N/A    Number of Children: 2  . 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 Smoking status: Former Smoker -- 2.00 packs/day for 50 years    Types: Cigarettes    Quit date: 02/08/2011  . Smokeless tobacco: Not on file  . Alcohol Use: No  . Drug Use: No  . Sexual Activity: Not on file   Other Topics Concern  . 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      Review of Systems Ears, nose, mouth, throat, and face: negative    respiratory: No shortness of breath cough or wheezing Cardiac: Occasional palpitations. No chest pain or edema Abdomen: Occasional left lower quadrant pain related to diverticulitis Extremities: Some chronic joint pain particularly left knee Hematologic: No history of abnormal bleeding or blood clot  Objective:  There were no vitals taken for this visit.  General: Alert, well-developed Caucasian female, in no distress Skin: Warm and dry without rash or infection. HEENT: No palpable masses or thyromegaly. Sclera nonicteric. Pupils equal round and reactive. Oropharynx clear. Breasts: some bruising of the right breast post core biopsy.  I cannot feel any masses in either breast. No skin changes or nipple discharge. No palpable axillary adenopathy. Lymph nodes: No cervical, supraclavicular, or inguinal nodes palpable. Lungs: Breath sounds clear and equal without increased work of breathing Cardiovascular: Regular rate and rhythm without murmur. No JVD or edema. Peripheral pulses intact. Abdomen: Nondistended. Soft and nontender. No masses palpable. No organomegaly. No palpable hernias. Extremities: No edema or joint swelling or deformity. No chronic venous stasis changes. Neurologic: Alert and fully oriented. Gait  normal.   Laboratory data:  CBC:  Lab Results  Component Value Date   WBC 4.7 02/17/2014   WBC 6.2 10/24/2012   RBC 4.56 02/17/2014   RBC 4.42 10/24/2012   HGB 11.8 02/17/2014   HGB 12.7 10/24/2012   HCT 36.6 02/17/2014   HCT 37.5 10/24/2012   PLT 187 02/17/2014   PLT 218 10/24/2012  ]  [facility] Labs:  Lab Results  Component Value Date   NA 140 02/17/2014   NA 138 10/24/2012   K 4.6 02/17/2014   K 4.6 10/24/2012   CL 104 10/24/2012   CO2 24 02/17/2014   CO2 26 10/24/2012   BUN 15.6 02/17/2014   BUN 15 10/24/2012   CREATININE 0.8 02/17/2014   CREATININE 0.69 10/24/2012   CREATININE 0.57 05/23/2011   CALCIUM 10.1 02/17/2014   CALCIUM 9.5 10/24/2012   PROT 8.1 02/17/2014   PROT 7.5 10/24/2012   BILITOT 0.39 02/17/2014   BILITOT 0.4 10/24/2012   BILIDIR 0.1 04/05/2008   ALKPHOS 122 02/17/2014   ALKPHOS 125* 10/24/2012   AST 22 02/17/2014   AST 21 10/24/2012   ALT 22 02/17/2014   ALT 18 10/24/2012     Assessment  68 y.o. female with a new diagnosis of cancer of the the right breast upper breast 12:00.  Clinical IA, (T1BNO) estrogen receptor positive. I discussed with the patient and family members present today initial surgical treatment options. We discussed options of breast conservation with lumpectomy or total mastectomy and sentinal lymph node biopsy/dissection. Options for reconstruction were discussed.  After discussion they have elected to proceed with right partial mastectomy with axillary sentinel lymph node biopsy. We discussed that we would plan to remove the adjacent area of atypical ductal hyperplasia at the same time..  We discussed the indications and nature of the procedure, and expected recovery, in detail. Surgical risks including anesthetic complications, cardiorespiratory complications, bleeding, infection, wound healing complications, blood clots, lymphedema, local and distant recurrence and possible need for further surgery based on the final pathology was discussed and understood.  Chemotherapy, hormonal therapy and radiation therapy have been discussed. They have been provided with literature regarding the treatment of breast cancer.  All questions were answered. They understand and agree to proceed and we will go ahead with scheduling. We will need to obtain cardiology clearance preoperatively and stop Coumadin for 5 days  Plan right partial mastectomy right axillary sentinel lymph node biopsy. We will plan to excise the adjacent node area of atypical ductal hyperplasia as well. We will need preoperative cardiac clearance and stopped Coumadin 5 days in advance.  Edward Jolly MD, FACS  02/17/2014, 2:18 PM

## 2014-02-17 NOTE — Telephone Encounter (Signed)
New message     Patient will be having a lumpectomy  From Dr[facility]Hoxworth  @ [facility]   Date & time is not schedule yet .   Please advise on coumadin.

## 2014-02-17 NOTE — Telephone Encounter (Signed)
Will forward to Dr Percival Spanish for clearance and orders r/t holding Coumadin.

## 2014-02-17 NOTE — Progress Notes (Signed)
Checked in new patient with no financial issues. She has appt card and [facility] packet. °

## 2014-02-17 NOTE — Progress Notes (Signed)
[facility] Psychosocial Distress Screening Clinical Social Work  Interior and spatial designer met with Patient and husband at breast clinic.  The patient scored a 7 on the Psychosocial Distress Thermometer which indicates moderate distress. Clinical Social Worker Intern spoke with to assess for distress and other psychosocial needs. Patient stated concerns were addressed with Dr. Madison Hickman provided supportive listening.  Patient was given written information on resources and programs.  Patient agrees to contact for further assistance if needed.   Clinical Social Worker follow up needed: no  If yes, follow up plan:   Efe Fazzino S. [place] Work Intern [employer] [phone]

## 2014-02-17 NOTE — Addendum Note (Signed)
Addended by: Lannette Donath E on: 02/17/2014 06:07 PM   Modules accepted: Medications

## 2014-02-17 NOTE — Progress Notes (Signed)
[name]  [phone] [phone]     ID: [name] OB: December 05, 1946  MR#: [id number]  HQP#:[id number]  PCP: [name], MD GYN:   SU: Excell Seltzer OTHER MD: [name], [name]  CHIEF COMPLAINT: "I had by routine mammogram and a found cancer"   BREAST CANCER HISTORY: "Melody Casey" had routine breast mammography at [facility] 01/14/2014. The breast density was category B. A new cluster of heterogeneous calcifications were noted in the right breast and on 01/28/2014 the patient underwent stereotactic biopsy of this area with the pathology ([id number]) showing an invasive ductal carcinoma, grade 1, estrogen receptor 100% positive, with strong staining intensity; progesterone receptor 63% positive, with moderate staining intensity, with an MIB-1 of 26% and no HER-2 amplification, the ratio being 1.44 and the number per cell 2.80.  On 02/01/2014 the patient underwent bilateral MRI of the breast, with no enhancement associated with the previously biopsied area. However there was a separate 7 mm enhancing mass in the lower inner quadrant and biopsy of this mass 02/03/2014 showed benign breast parenchyma, with no atypia. Review of this pathology showed atypical ductal hyperplasia, and therefore excisional biopsy was recommended, performed 02/10/2014 and showing only atypical ductal hyperplasia, with adenosis and calcifications ([id number]).  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 tumor. Her subsequent history is as detailed below  INTERVAL HISTORY: Melody Casey was evaluated in the multidisciplinary breast cancer clinic on 02/17/2014 accompanied by her husband Melody Casey.   REVIEW OF SYSTEMS: There were no specific symptoms leading to the original mammogram, which was routinely  scheduled. The patient denies unusual headaches, visual changes, nausea, vomiting, stiff neck, dizziness, or gait imbalance. There has been no cough, phlegm production, or pleurisy, no chest pain or pressure, and no change in bowel or bladder habits. The patient denies fever, rash, bleeding, unexplained fatigue or unexplained weight loss. 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 It is not clear what this procedure in detail. Just history rare mild epistaxis, tinnitus, and irregular heartbeat. She feels anxious but not depressed. A detailed review of systems was otherwise entirely negative.  PAST MEDICAL HISTORY: Past Medical History  Diagnosis Date  . HLD (hyperlipidemia)   . HTN (hypertension)   . Arthritis   . Foot deformity     right  . CAD (coronary artery disease)     a. NSTEMI 02/2011: occ mid Cx, DES to OM2, residual nonobst LAD dz.  . Tobacco abuse   . Diabetes mellitus   . Atrial fibrillation     a. Dx 03/2011 - on tikosyn/coumadin.  . Anxiety   . Diverticulitis of colon 2008 and 04/2011  . Ureteral obstruction     History of gross hematuria/right hydronephrosis 2/2 to uteropelvic junction obstruction, s/p cystoscopy in January 2007 with bilateral retrograde pyelography, right ureter arthroscopy, right ureteral stent placement, bladder biopsies, stent removal since then.  . Heart attack     PAST SURGICAL HISTORY: Past Surgical History  Procedure Laterality Date  . Tonsillectomy and adenoidectomy    . Knee arthroscopy      left  . Tubal ligation    . Right leg surgery  age 65    As a child for  ?scleroderma per patient  .  Ureteral surgery    . Coronary stent placement      FAMILY HISTORY Family History  Problem Relation Age of Onset  . Colon cancer Neg Hx   . Liver disease Neg Hx   . Lung cancer Father 25    deceased  . Heart failure Mother 56    deceased  The patient's  father died at the age of 64 with a history of lung cancer. He was a smoker and also had asbestos exposure. The patient's mother died at the age of 20. The patient had one brother. There is no other history of cancer in the family to her knowledge  GYNECOLOGIC HISTORY:  Menarche age 32, first live birth age 16. The patient stopped having periods approximately 1997. She did not take hormone replacement.   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 Her husband of 65 years, Melody Casey "Melody Casey" [name] is a retired Futures trader. He has a history of [facility]. Daughter Melody Casey lives in [name] and is a Scientist, clinical (histocompatibility and immunogenetics). Son Melody Casey lives in [name] and he is an Conservator, museum/gallery. The patient has 7 grandchildren and 2 great-grandchildren. She attends [employer]     ADVANCED DIRECTIVES:  in place    HEALTH MAINTENANCE: History  Substance Use Topics  . Smoking status: Former Smoker -- 2.00 packs/day for 50 years    Types: Cigarettes    Quit date: 02/08/2011  . Smokeless tobacco: Not on file  . Alcohol Use: No     Colonoscopy: never   PAP: 2013  Bone density:Never  Lipid panel:  Allergies  Allergen Reactions  . Acetaminophen Hives  . Oxycodone-Acetaminophen Itching  . Penicillins Other (See Comments)    Pt had so much as a child she's immune to it  . Prednisone Itching  . Miralax [Polyethylene Glycol] Swelling and Rash    Took [facility] brand developed rash. Patient states she tolerated name brand MiraLax in the past.  . Sulfa Antibiotics Rash    Current Outpatient Prescriptions  Medication Sig Dispense Refill  . ALPRAZolam (XANAX) 0.25 MG tablet Take 0.25 mg by mouth daily as needed for anxiety.       Marland Kitchen amLODipine (NORVASC) 2.5 MG tablet Take 1 tablet (2.5 mg total) by mouth daily.  90 tablet  3  . aspirin 81 MG tablet Take 81 mg by  mouth every morning.       Marland Kitchen atorvastatin (LIPITOR) 40 MG tablet TAKE ONE AND ONE-HALF TABLETS BY MOUTH IN THE EVENING  135 tablet  0  . ciprofloxacin (CIPRO) 500 MG tablet Take 1 tablet (500 mg total) by mouth 2 (two) times daily.  20 tablet  0  . dofetilide (TIKOSYN) 500 MCG capsule Take 1 capsule (500 mcg total) by mouth 2 (two) times daily.  60 capsule  11  . ibuprofen (ADVIL,MOTRIN) 200 MG tablet Take 200-400 mg by mouth daily as needed. For pain      . lisinopril (PRINIVIL,ZESTRIL) 40 MG tablet Take 40 mg by mouth every morning.       . metFORMIN (GLUMETZA) 500 MG (MOD) 24 hr tablet Take 500 mg by mouth every evening.       . metroNIDAZOLE (FLAGYL) 500 MG tablet Take 1 tablet (500 mg total) by mouth 4 (four) times daily. One po bid x 7 days  40 tablet  0  . niacin (  NIASPAN) 500 MG CR tablet Take 500 mg by mouth at bedtime.        . nitroGLYCERIN (NITROSTAT) 0.4 MG SL tablet Place 0.4 mg under the tongue every 5 (five) minutes as needed.        . potassium chloride SA (K-DUR,KLOR-CON) 20 MEQ tablet Take 20 mEq by mouth 2 (two) times daily.        . traMADol (ULTRAM) 50 MG tablet Take 1 tablet (50 mg total) by mouth every 6 (six) hours as needed.  20 tablet  0  . warfarin (COUMADIN) 5 MG tablet Take 5-7.5 mg by mouth every evening. Take 7.5 mg on wednesdays only.  Take 5 mg all other days       No current facility-administered medications for this visit.    OBJECTIVE: Older white woman who appears stated age  46 Vitals:   02/17/14 1250  BP: 166/78  Pulse: 64  Temp: 98.1 F (36.7 C)  Resp: 20     Body mass index is 28.21 kg/(m^2).    ECOG FS:1 - Symptomatic but completely ambulatory  Ocular: Sclerae unicteric, pupils  round and equal  Ear-nose-throat: Oropharynx clear and moist  Lymphatic: No cervical or supraclavicular adenopathy Lungs no rales or rhonchi, good excursion bilaterally Heart regular rate and rhythm today , no murmur appreciated Abd soft, nontender, positive bowel  sounds MSK no focal spinal tenderness, no joint edema Neuro: non-focal, well-oriented, appropriate affect Breasts:  The right breast is status post recent biopsy. There is a mild ecchymosis. I do not palpate a mass. There is no skin or nipple change of concern. The right axilla is benign. The left breast is unremarkable   LAB RESULTS:  CMP     Component Value Date/Time   NA 140 02/17/2014 1206   NA 138 10/24/2012 1450   K 4.6 02/17/2014 1206   K 4.6 10/24/2012 1450   CL 104 10/24/2012 1450   CO2 24 02/17/2014 1206   CO2 26 10/24/2012 1450   GLUCOSE 103 02/17/2014 1206   GLUCOSE 110* 10/24/2012 1450   BUN 15.6 02/17/2014 1206   BUN 15 10/24/2012 1450   CREATININE 0.8 02/17/2014 1206   CREATININE 0.69 10/24/2012 1450   CREATININE 0.57 05/23/2011 1300   CALCIUM 10.1 02/17/2014 1206   CALCIUM 9.5 10/24/2012 1450   PROT 8.1 02/17/2014 1206   PROT 7.5 10/24/2012 1450   ALBUMIN 3.8 02/17/2014 1206   ALBUMIN 3.5 10/24/2012 1450   AST 22 02/17/2014 1206   AST 21 10/24/2012 1450   ALT 22 02/17/2014 1206   ALT 18 10/24/2012 1450   ALKPHOS 122 02/17/2014 1206   ALKPHOS 125* 10/24/2012 1450   BILITOT 0.39 02/17/2014 1206   BILITOT 0.4 10/24/2012 1450   GFRNONAA 87* 10/24/2012 1450   GFRAA >90 10/24/2012 1450    I No results found for this basename: SPEP, UPEP,  kappa and lambda light chains    Lab Results  Component Value Date   WBC 4.7 02/17/2014   NEUTROABS 2.6 02/17/2014   HGB 11.8 02/17/2014   HCT 36.6 02/17/2014   MCV 80.2 02/17/2014   PLT 187 02/17/2014      Chemistry      Component Value Date/Time   NA 140 02/17/2014 1206   NA 138 10/24/2012 1450   K 4.6 02/17/2014 1206   K 4.6 10/24/2012 1450   CL 104 10/24/2012 1450   CO2 24 02/17/2014 1206   CO2 26 10/24/2012 1450   BUN 15.6 02/17/2014 1206  BUN 15 10/24/2012 1450   CREATININE 0.8 02/17/2014 1206   CREATININE 0.69 10/24/2012 1450   CREATININE 0.57 05/23/2011 1300      Component Value Date/Time   CALCIUM 10.1 02/17/2014  1206   CALCIUM 9.5 10/24/2012 1450   ALKPHOS 122 02/17/2014 1206   ALKPHOS 125* 10/24/2012 1450   AST 22 02/17/2014 1206   AST 21 10/24/2012 1450   ALT 22 02/17/2014 1206   ALT 18 10/24/2012 1450   BILITOT 0.39 02/17/2014 1206   BILITOT 0.4 10/24/2012 1450       No results found for this basename: LABCA2    No components found with this basename: LABCA125    No results found for this basename: INR,  in the last 168 hours  Urinalysis    Component Value Date/Time   COLORURINE YELLOW 10/24/2012 [place] 10/24/2012 1750   LABSPEC 1.015 10/24/2012 1750   PHURINE 6.5 10/24/2012 1750   GLUCOSEU NEGATIVE 10/24/2012 1750   HGBUR NEGATIVE 10/24/2012 1750   BILIRUBINUR NEGATIVE 10/24/2012 1750   KETONESUR NEGATIVE 10/24/2012 1750   PROTEINUR NEGATIVE 10/24/2012 1750   UROBILINOGEN 0.2 10/24/2012 1750   NITRITE NEGATIVE 10/24/2012 1750   LEUKOCYTESUR LARGE* 10/24/2012 1750    STUDIES: Mr Breast Bilateral W Wo Contrast  02/01/2014   CLINICAL DATA:  Recently diagnosed right breast DCIS on stereotactic core biopsy performed at [facility] imaging. Preoperative evaluation.  EXAM: BILATERAL BREAST MRI WITH AND WITHOUT CONTRAST  LABS:  BUN and creatinine were obtained on site at [place] at  [address]  Results:  BUN 21 mg/dL,  Creatinine 0.6 mg/dL.  TECHNIQUE: Multiplanar, multisequence MR images of both breasts were obtained prior to and following the intravenous administration of 16m of MultiHance  THREE-DIMENSIONAL MR IMAGE RENDERING ON INDEPENDENT WORKSTATION:  Three-dimensional MR images were rendered by post-processing of the original MR data on an independent workstation. The three-dimensional MR images were interpreted, and findings are reported in the following complete MRI report for this study. Three dimensional images were evaluated at the independent DynaCad workstation  COMPARISON:  [facility] 01/14/2014,  01/14/2013, 07/08/2012, 01/02/2012.  FINDINGS: Breast composition: c:  Heterogeneous fibroglandular tissue  Background parenchymal enhancement: Moderate  Right breast: In the area of the known DCIS there is no abnormal enhancement. There is a slightly irregular, oval, enhancing mass located within the lower inner quadrant of the right breast (at the junction of the anterior and middle 1/3) which measures 7 x 6 x 5 mm in size and is associated with plateau enhancement kinetics. This is located approximately 2.5 cm inferior and medial to the clip related to the recent stereotactic core biopsy. There are no additional areas of abnormal enhancement within the right breast.  Left breast: No mass or abnormal enhancement.  Lymph nodes: No abnormal appearing lymph nodes.  Ancillary findings:  None.  IMPRESSION: 1. No abnormal enhancement associated with the known DCIS in the area stereotactic core biopsy.  2. 7 mm enhancing mass located within the lower inner quadrant of the right breast (this is approximately 2.5 cm inferior and medial to the clip related to the recent stereotactic core biopsy). If breast conservation is planned, recommend MR guided core biopsy of this area.  RECOMMENDATION: Right breast MR guided core biopsy of the 7 mm enhancing mass located within the lower inner quadrant of the right as discussed above (if breast conservation is planned). I have discussed the findings with Dr. CMarcelo Baldy  BI-RADS  CATEGORY  4: Suspicious abnormality - biopsy should be considered.   Electronically Signed   By: Luberta Robertson M.D.   On: 02/01/2014 10:58   Mm Digital Diagnostic Unilat R  02/10/2014   CLINICAL DATA:  Recent diagnosis of DCIS in the upper right breast at stereotactic biopsy. She had a 7 mm enhancing mass in the inner right breast on pre-treatment MRI. MRI guided core needle biopsy of this mass was performed earlier today.  EXAM: POST-BIOPSY CLIP PLACEMENT RIGHT DIAGNOSTIC MAMMOGRAM  COMPARISON:  Previous  exams.  FINDINGS: Films are performed following MRI guided biopsy of a 7 mm enhancing mass in the inner right breast. The dumbbell-shaped tissue marker clip is positioned in the inner right breast, middle 1/3, corresponding to the area of concern on the MRI. There is a fluid-filled cavity with an air bubble at the biopsy site, so there may have been slight medial migration of the clip. The dumbbell-shaped clip is approximately 4.0-4.5 cm from the coil shaped clip placed at the time of stereotactic biopsy.  IMPRESSION: Appropriate positioning of the dumbbell-shaped tissue marker clip in the inner right breast, middle 1/3, at the site of the mass on the MRI. The dumbbell-shaped clip is approximately 4.0-4.5 cm from the coil shaped clip placed at the time of stereotactic biopsy.  Final Assessment: Post Procedure Mammograms for Marker Placement   Electronically Signed   By: Evangeline Dakin M.D.   On: 02/10/2014 10:30   Mr Rt Breast Bx Johnella Moloney Dev 1st Lesion Image Bx Spec Mr Guide  02/11/2014   ADDENDUM REPORT: 02/11/2014 10:52  ADDENDUM: Pathology results have become available and indicate fibrocystic change and adenosis with atypical ductal hyperplasia. Therefore, excisional biopsy of this area is recommended. I gave these results and recommendations to the patient by telephone at 10:30 a.m. on 02/11/2014. The patient has a pending appointment with the multidisciplinary clinic on March 11, 2,015. She reports no complications or complaints related to the biopsy.   Electronically Signed   By: Skipper Cliche M.D.   On: 02/11/2014 10:52   02/11/2014   CLINICAL DATA:  Recent diagnosis of DCIS in the upper right breast on stereotactic biopsy. Indeterminate 7 mm enhancing mass in the inner right breast on the pre-treatment MRI.  LABS:  BUN and creatinine were obtained on site at [place] at [address]  Results: BUN 21 mg/dL, Creatinine 0.6 mg/dL, estimated GFR greater than 60.  EXAM: MRI GUIDED CORE  NEEDLE BIOPSY OF THE RIGHT BREAST  TECHNIQUE: Multiplanar, multisequence MR imaging of the right breast was performed both before and after administration of intravenous contrast.  CONTRAST:  17 cc Multihance IV.  COMPARISON:  Bilateral breast MRI 02/01/2014.  FINDINGS: I met with the patient, and we discussed the procedure of MRI guided biopsy, including risks, benefits, and alternatives. Specifically, we discussed the risks of infection, bleeding, tissue injury, clip migration, and inadequate sampling. Informed, written consent was given. The usual time out protocol was performed immediately prior to the procedure.  Using sterile technique, 2% Lidocaine, MRI guidance, and an automated 9 gauge vacuum assisted device, biopsy was performed of the 7 mm mass in the inner right breast, middle 1/3, using a medial approach. At the conclusion of the procedure, a dumbbell shaped tissue marker clip was deployed into the biopsy cavity. Follow-up 2-view mammogram was performed and dictated separately.  IMPRESSION: MRI guided biopsy of an indeterminate 7 mm mass in the inner right breast. No apparent complications.  Electronically  Signed: By: Evangeline Dakin M.D. On: 02/10/2014 10:26    ASSESSMENT: 68 y.o. [place] woman status post right breast biopsy  01/28/2014 for a clinical T1b N0, stage IA invasive ductal carcinoma grade 1, estrogen and progesterone receptor positive, HER-2 not amplified, with an MIB-1 of 26%  PLAN: We spent the better part of today's hour-long appointment discussing the biology of breast cancer in general, and the specifics of the patient's tumor in particular. Melody Casey understands there is no survival advantage to mastectomy over lumpectomy, and accordingly lumpectomy is our recommendation. If the tumor is indeed a small as it appears, and proved to be node negative as we expect, then the benefit of chemotherapy would be so small that we would not consider it. For that reason we would not send an  Oncotype, even though that would be the recommendation from NCCN guidelines.  We also discussed the radiation options. She understands that for women who are 68 years old her with early stage estrogen receptor positive tumors, there is no survival benefit to anti-estrogens with adjuvant radiation as opposed to anti-estrogens without radiation. There is a small, less than 5% decrease in local recurrence for patients receiving radiation. For that reason adjuvant radiation in someone like her is optional.  We also discussed antiestrogen choices. She has a good understanding of the difference between tamoxifen and anastrozole, although we will discuss this at greater length on her return visit. Very likely we will start with anastrozole, but to help [place] with that decision we will obtain a bone density before her return visit here.  Accordingly the overall plan is to start with breast conserving surgery, then review the surgical results. If Dr. Isidore Moos agrees that the patient may forego radiation, then the plan will be to start antiestrogen pills at the next visit here.  Melody Casey has a good understanding of this plan. She agrees with it. She knows a goal of treatment is cure. She will call with any problems that may develop before her next visit here.     Chauncey Cruel, MD   02/17/2014 3:45 PM

## 2014-02-18 ENCOUNTER — Encounter: Payer: Self-pay | Admitting: *Deleted

## 2014-02-19 NOTE — Telephone Encounter (Signed)
Returned call to patient Dr.Hochrein advised ok to hold coumadin 5 days before lumpectomy.No bridging needed.Dr.Hoxworth's office notified.

## 2014-02-19 NOTE — Telephone Encounter (Signed)
She would be fine to hold warfarin as needed for lumpectomy .  No bridging would be needed.

## 2014-02-22 ENCOUNTER — Encounter: Payer: Self-pay | Admitting: *Deleted

## 2014-02-22 DIAGNOSIS — I4891 Unspecified atrial fibrillation: Secondary | ICD-10-CM | POA: Diagnosis not present

## 2014-02-22 DIAGNOSIS — Z5181 Encounter for therapeutic drug level monitoring: Secondary | ICD-10-CM | POA: Diagnosis not present

## 2014-02-22 NOTE — [facility] Oncology Navigator Note (Signed)
Patient called to notify me that her cardiologist's office had called her and told her that she was cleared for surgery and should hold her coumadin for 5 days.  Patient checked with Dr. Lear Ng office and they had not received the information and she was concerned because she is anxious to get her surgery scheduled.  I checked the notes which state that his office has been notified.  I told patient that I would also send an in basket message to Dr. Excell Seltzer.  I followed up with patient about her [facility] visit on 02/17/14.  Patient denied any questions or concerns at this time about her diagnosis or treatment plan.  She did report that she had a "big cry" yesterday.  I told patient that I would call her back when her surgery is scheduled to confirm her appointments.  I encouraged her to call me for any questions or concerns she may have.  Patient verbalized understanding.

## 2014-02-23 ENCOUNTER — Telehealth (INDEPENDENT_AMBULATORY_CARE_PROVIDER_SITE_OTHER): Payer: Self-pay

## 2014-02-23 NOTE — Telephone Encounter (Signed)
Called and spoke to patient regarding clearance rec'd.  Patient advised that our [facility] will be calling to schedule for surgery.  Patient would like to be contacted at [phone] to discuss surgery dates  Patient aware to hold Coumadin 5 day's prior to surgery as well.

## 2014-02-23 NOTE — Telephone Encounter (Signed)
Message copied by Ivor Costa on Tue Feb 23, 2014 10:47 AM ------      Message from: Joselyn Arrow      Created: Mon Feb 22, 2014  9:50 AM      Contact: [phone]       CALL PT RE: CARDIAC CLEARANCE & QUESTION RE: MEDS/WANTS TO SET UP SX ASAP/GM ------

## 2014-02-24 ENCOUNTER — Telehealth: Payer: Self-pay | Admitting: *Deleted

## 2014-02-24 ENCOUNTER — Encounter (HOSPITAL_BASED_OUTPATIENT_CLINIC_OR_DEPARTMENT_OTHER): Payer: Self-pay | Admitting: *Deleted

## 2014-02-24 NOTE — Progress Notes (Signed)
Pt had AF with MI 2012-stent put in-stopped smoking then On coumadin-now breast cancer for lumpectomy snbx Monday 03/01/14 Will be off coumadin 5 days-will have to come here 11am for pt ptt-then go to [facility] for nl then back here for surgery-cbc cmet done 02/17/14-ekg 12/14-cleared for surgery dr hochrein

## 2014-02-24 NOTE — Telephone Encounter (Signed)
Faxed Care Plan to PCP and took to HIM to scan.

## 2014-03-01 ENCOUNTER — Ambulatory Visit (HOSPITAL_BASED_OUTPATIENT_CLINIC_OR_DEPARTMENT_OTHER)
Admission: RE | Admit: 2014-03-01 | Discharge: 2014-03-01 | Disposition: A | Discharge status: Home / Self Care | Payer: Medicare Other | Source: Ambulatory Visit | Attending: General Surgery | Admitting: General Surgery

## 2014-03-01 ENCOUNTER — Ambulatory Visit (HOSPITAL_COMMUNITY)
Admission: RE | Admit: 2014-03-01 | Discharge: 2014-03-01 | Disposition: A | Discharge status: Home / Self Care | Payer: Medicare Other | Source: Ambulatory Visit | Attending: General Surgery | Admitting: General Surgery

## 2014-03-01 ENCOUNTER — Encounter (HOSPITAL_BASED_OUTPATIENT_CLINIC_OR_DEPARTMENT_OTHER): Payer: Self-pay | Admitting: Certified Registered"

## 2014-03-01 ENCOUNTER — Encounter (HOSPITAL_BASED_OUTPATIENT_CLINIC_OR_DEPARTMENT_OTHER)
Admission: RE | Admit: 2014-03-01 | Discharge: 2014-03-01 | Disposition: A | Discharge status: Home / Self Care | Payer: Medicare Other | Source: Ambulatory Visit | Attending: General Surgery | Admitting: General Surgery

## 2014-03-01 ENCOUNTER — Ambulatory Visit (HOSPITAL_BASED_OUTPATIENT_CLINIC_OR_DEPARTMENT_OTHER): Payer: Medicare Other | Admitting: Certified Registered"

## 2014-03-01 ENCOUNTER — Encounter (HOSPITAL_BASED_OUTPATIENT_CLINIC_OR_DEPARTMENT_OTHER)
Admission: RE | Disposition: A | Discharge status: Home / Self Care | Payer: Self-pay | Source: Ambulatory Visit | Attending: General Surgery

## 2014-03-01 ENCOUNTER — Encounter (HOSPITAL_BASED_OUTPATIENT_CLINIC_OR_DEPARTMENT_OTHER): Payer: Medicare Other | Admitting: Certified Registered"

## 2014-03-01 DIAGNOSIS — I10 Essential (primary) hypertension: Secondary | ICD-10-CM | POA: Insufficient documentation

## 2014-03-01 DIAGNOSIS — E119 Type 2 diabetes mellitus without complications: Secondary | ICD-10-CM | POA: Insufficient documentation

## 2014-03-01 DIAGNOSIS — E785 Hyperlipidemia, unspecified: Secondary | ICD-10-CM | POA: Diagnosis not present

## 2014-03-01 DIAGNOSIS — R928 Other abnormal and inconclusive findings on diagnostic imaging of breast: Secondary | ICD-10-CM | POA: Diagnosis not present

## 2014-03-01 DIAGNOSIS — C50411 Malignant neoplasm of upper-outer quadrant of right female breast: Secondary | ICD-10-CM

## 2014-03-01 DIAGNOSIS — F411 Generalized anxiety disorder: Secondary | ICD-10-CM | POA: Diagnosis not present

## 2014-03-01 DIAGNOSIS — Z170 Estrogen receptor positive status [ER+]: Secondary | ICD-10-CM | POA: Diagnosis not present

## 2014-03-01 DIAGNOSIS — Z9861 Coronary angioplasty status: Secondary | ICD-10-CM | POA: Diagnosis not present

## 2014-03-01 DIAGNOSIS — I2510 Atherosclerotic heart disease of native coronary artery without angina pectoris: Secondary | ICD-10-CM | POA: Insufficient documentation

## 2014-03-01 DIAGNOSIS — C50919 Malignant neoplasm of unspecified site of unspecified female breast: Secondary | ICD-10-CM | POA: Diagnosis not present

## 2014-03-01 DIAGNOSIS — I252 Old myocardial infarction: Secondary | ICD-10-CM | POA: Insufficient documentation

## 2014-03-01 DIAGNOSIS — Z0189 Encounter for other specified special examinations: Secondary | ICD-10-CM | POA: Diagnosis not present

## 2014-03-01 DIAGNOSIS — Z7901 Long term (current) use of anticoagulants: Secondary | ICD-10-CM | POA: Diagnosis not present

## 2014-03-01 DIAGNOSIS — I4891 Unspecified atrial fibrillation: Secondary | ICD-10-CM | POA: Insufficient documentation

## 2014-03-01 DIAGNOSIS — Z87891 Personal history of nicotine dependence: Secondary | ICD-10-CM | POA: Diagnosis not present

## 2014-03-01 DIAGNOSIS — N644 Mastodynia: Secondary | ICD-10-CM | POA: Diagnosis not present

## 2014-03-01 DIAGNOSIS — D0590 Unspecified type of carcinoma in situ of unspecified breast: Secondary | ICD-10-CM | POA: Diagnosis not present

## 2014-03-01 DIAGNOSIS — G8918 Other acute postprocedural pain: Secondary | ICD-10-CM | POA: Diagnosis not present

## 2014-03-01 DIAGNOSIS — Z780 Asymptomatic menopausal state: Secondary | ICD-10-CM | POA: Diagnosis not present

## 2014-03-01 HISTORY — DX: Presence of dental prosthetic device (complete) (partial): Z97.2

## 2014-03-01 HISTORY — PX: BREAST LUMPECTOMY WITH NEEDLE LOCALIZATION AND AXILLARY SENTINEL LYMPH NODE BX: SHX5760

## 2014-03-01 LAB — PROTIME-INR
INR: 0.92 (ref 0.00–1.49)
PROTHROMBIN TIME: 12.2 s (ref 11.6–15.2)

## 2014-03-01 LAB — APTT: aPTT: 27 seconds (ref 24–37)

## 2014-03-01 LAB — GLUCOSE, CAPILLARY: Glucose-Capillary: 118 mg/dL — ABNORMAL HIGH (ref 70–99)

## 2014-03-01 SURGERY — BREAST LUMPECTOMY WITH NEEDLE LOCALIZATION AND AXILLARY SENTINEL LYMPH NODE BX
Anesthesia: Regional | Site: Breast | Laterality: Right

## 2014-03-01 MED ORDER — CHLORHEXIDINE GLUCONATE 4 % EX LIQD: 1.0000 "application " | Freq: Once | CUTANEOUS | Status: DC

## 2014-03-01 MED ORDER — SODIUM CHLORIDE 0.9 % IJ SOLN
INTRAMUSCULAR | Status: AC
  Filled 2014-03-01: qty 10

## 2014-03-01 MED ORDER — ONDANSETRON HCL 4 MG/2ML IJ SOLN
INTRAMUSCULAR | Status: DC | PRN
  Administered 2014-03-01: 4 mg via INTRAVENOUS

## 2014-03-01 MED ORDER — MIDAZOLAM HCL 2 MG/2ML IJ SOLN
INTRAMUSCULAR | Status: AC
  Filled 2014-03-01: qty 2

## 2014-03-01 MED ORDER — HYDROMORPHONE HCL PF 1 MG/ML IJ SOLN: 0.2500 mg | INTRAMUSCULAR | Status: DC | PRN

## 2014-03-01 MED ORDER — BUPIVACAINE-EPINEPHRINE 0.5% -1:200000 IJ SOLN
INTRAMUSCULAR | Status: DC | PRN
  Administered 2014-03-01: 10 mL

## 2014-03-01 MED ORDER — LACTATED RINGERS IV SOLN
INTRAVENOUS | Status: DC
  Administered 2014-03-01 (×2): via INTRAVENOUS

## 2014-03-01 MED ORDER — EPHEDRINE SULFATE 50 MG/ML IJ SOLN
INTRAMUSCULAR | Status: DC | PRN
  Administered 2014-03-01 (×2): 10 mg via INTRAVENOUS

## 2014-03-01 MED ORDER — BUPIVACAINE-EPINEPHRINE PF 0.5-1:200000 % IJ SOLN
INTRAMUSCULAR | Status: AC
  Filled 2014-03-01: qty 30

## 2014-03-01 MED ORDER — FENTANYL CITRATE 0.05 MG/ML IJ SOLN
INTRAMUSCULAR | Status: DC | PRN
  Administered 2014-03-01: 50 ug via INTRAVENOUS

## 2014-03-01 MED ORDER — LIDOCAINE HCL (CARDIAC) 20 MG/ML IV SOLN
INTRAVENOUS | Status: DC | PRN
  Administered 2014-03-01: 30 mg via INTRAVENOUS

## 2014-03-01 MED ORDER — CIPROFLOXACIN IN D5W 400 MG/200ML IV SOLN
400.0000 mg | INTRAVENOUS | Status: AC
  Administered 2014-03-01: 400 mg via INTRAVENOUS

## 2014-03-01 MED ORDER — TRAMADOL HCL 50 MG PO TABS: 50.0000 mg | ORAL_TABLET | Freq: Four times a day (QID) | ORAL | Status: DC | PRN

## 2014-03-01 MED ORDER — DEXAMETHASONE SODIUM PHOSPHATE 4 MG/ML IJ SOLN
INTRAMUSCULAR | Status: DC | PRN
  Administered 2014-03-01: 4 mg via INTRAVENOUS

## 2014-03-01 MED ORDER — BUPIVACAINE-EPINEPHRINE PF 0.5-1:200000 % IJ SOLN
INTRAMUSCULAR | Status: DC | PRN
  Administered 2014-03-01: 30 mL via PERINEURAL

## 2014-03-01 MED ORDER — FENTANYL CITRATE 0.05 MG/ML IJ SOLN
INTRAMUSCULAR | Status: AC
  Filled 2014-03-01: qty 2

## 2014-03-01 MED ORDER — FENTANYL CITRATE 0.05 MG/ML IJ SOLN
INTRAMUSCULAR | Status: AC
  Filled 2014-03-01: qty 6

## 2014-03-01 MED ORDER — TECHNETIUM TC 99M SULFUR COLLOID FILTERED
1.0000 | Freq: Once | INTRAVENOUS | Status: AC | PRN
  Administered 2014-03-01: 1 via INTRADERMAL

## 2014-03-01 MED ORDER — PROPOFOL 10 MG/ML IV BOLUS
INTRAVENOUS | Status: DC | PRN
  Administered 2014-03-01: 150 mg via INTRAVENOUS

## 2014-03-01 MED ORDER — MIDAZOLAM HCL 2 MG/2ML IJ SOLN
1.0000 mg | INTRAMUSCULAR | Status: DC | PRN
  Administered 2014-03-01: 2 mg via INTRAVENOUS

## 2014-03-01 MED ORDER — CIPROFLOXACIN IN D5W 400 MG/200ML IV SOLN
INTRAVENOUS | Status: AC
  Filled 2014-03-01: qty 200

## 2014-03-01 MED ORDER — FENTANYL CITRATE 0.05 MG/ML IJ SOLN
50.0000 ug | INTRAMUSCULAR | Status: DC | PRN
  Administered 2014-03-01: 100 ug via INTRAVENOUS

## 2014-03-01 SURGICAL SUPPLY — 61 items
APPLIER CLIP 11 MED OPEN (CLIP)
BINDER BREAST XLRG (GAUZE/BANDAGES/DRESSINGS) ×3 IMPLANT
BLADE SURG 10 STRL SS (BLADE) ×3 IMPLANT
BLADE SURG 15 STRL LF DISP TIS (BLADE) ×1 IMPLANT
BLADE SURG 15 STRL SS (BLADE) ×2
CANISTER SUCT 1200ML W/VALVE (MISCELLANEOUS) ×3 IMPLANT
CHLORAPREP W/TINT 26ML (MISCELLANEOUS) ×3 IMPLANT
CLIP APPLIE 11 MED OPEN (CLIP) IMPLANT
CLIP TI WIDE RED SMALL 6 (CLIP) ×3 IMPLANT
COVER MAYO STAND STRL (DRAPES) ×3 IMPLANT
COVER PROBE W GEL 5X96 (DRAPES) ×3 IMPLANT
COVER TABLE BACK 60X90 (DRAPES) ×3 IMPLANT
DECANTER SPIKE VIAL GLASS SM (MISCELLANEOUS) IMPLANT
DERMABOND ADVANCED (GAUZE/BANDAGES/DRESSINGS) ×2
DERMABOND ADVANCED .7 DNX12 (GAUZE/BANDAGES/DRESSINGS) ×1 IMPLANT
DEVICE DUBIN W/COMP PLATE 8390 (MISCELLANEOUS) ×3 IMPLANT
DRAIN CHANNEL 19F RND (DRAIN) IMPLANT
DRAIN HEMOVAC 1/8 X 5 (WOUND CARE) IMPLANT
DRAPE LAPAROSCOPIC ABDOMINAL (DRAPES) ×3 IMPLANT
DRAPE UTILITY XL STRL (DRAPES) ×3 IMPLANT
ELECT COATED BLADE 2.86 ST (ELECTRODE) ×3 IMPLANT
ELECT REM PT RETURN 9FT ADLT (ELECTROSURGICAL) ×3
ELECTRODE REM PT RTRN 9FT ADLT (ELECTROSURGICAL) ×1 IMPLANT
EVACUATOR SILICONE 100CC (DRAIN) IMPLANT
GLOVE BIOGEL M 7.0 STRL (GLOVE) ×6 IMPLANT
GLOVE BIOGEL PI IND STRL 7.5 (GLOVE) ×1 IMPLANT
GLOVE BIOGEL PI IND STRL 8 (GLOVE) ×1 IMPLANT
GLOVE BIOGEL PI INDICATOR 7.5 (GLOVE) ×2
GLOVE BIOGEL PI INDICATOR 8 (GLOVE) ×2
GLOVE EXAM NITRILE EXT CUFF MD (GLOVE) ×3 IMPLANT
GLOVE SS BIOGEL STRL SZ 7.5 (GLOVE) ×2 IMPLANT
GLOVE SUPERSENSE BIOGEL SZ 7.5 (GLOVE) ×4
GOWN STRL REUS W/ TWL LRG LVL3 (GOWN DISPOSABLE) ×2 IMPLANT
GOWN STRL REUS W/ TWL XL LVL3 (GOWN DISPOSABLE) ×1 IMPLANT
GOWN STRL REUS W/TWL LRG LVL3 (GOWN DISPOSABLE) ×4
GOWN STRL REUS W/TWL XL LVL3 (GOWN DISPOSABLE) ×2
KIT MARKER MARGIN INK (KITS) ×3 IMPLANT
NDL SAFETY ECLIPSE 18X1.5 (NEEDLE) ×1 IMPLANT
NEEDLE HYPO 18GX1.5 SHARP (NEEDLE) ×2
NEEDLE HYPO 25X1 1.5 SAFETY (NEEDLE) ×6 IMPLANT
NS IRRIG 1000ML POUR BTL (IV SOLUTION) ×3 IMPLANT
PACK BASIN DAY SURGERY FS (CUSTOM PROCEDURE TRAY) ×3 IMPLANT
PAD ALCOHOL SWAB (MISCELLANEOUS) ×3 IMPLANT
PENCIL BUTTON HOLSTER BLD 10FT (ELECTRODE) ×3 IMPLANT
PIN SAFETY STERILE (MISCELLANEOUS) IMPLANT
SLEEVE SCD COMPRESS KNEE MED (MISCELLANEOUS) ×3 IMPLANT
SPONGE LAP 18X18 X RAY DECT (DISPOSABLE) IMPLANT
SPONGE LAP 4X18 X RAY DECT (DISPOSABLE) ×3 IMPLANT
SUT ETHILON 3 0 FSL (SUTURE) IMPLANT
SUT MON AB 4-0 PC3 18 (SUTURE) IMPLANT
SUT MON AB 5-0 PS2 18 (SUTURE) ×3 IMPLANT
SUT SILK 3 0 SH 30 (SUTURE) ×3 IMPLANT
SUT VIC AB 3-0 SH 27 (SUTURE)
SUT VIC AB 3-0 SH 27X BRD (SUTURE) IMPLANT
SUT VICRYL 3-0 CR8 SH (SUTURE) ×3 IMPLANT
SYR CONTROL 10ML LL (SYRINGE) ×3 IMPLANT
TOWEL OR 17X24 6PK STRL BLUE (TOWEL DISPOSABLE) ×3 IMPLANT
TOWEL OR NON WOVEN STRL DISP B (DISPOSABLE) ×3 IMPLANT
TUBE CONNECTING 20'X1/4 (TUBING) ×1
TUBE CONNECTING 20X1/4 (TUBING) ×2 IMPLANT
YANKAUER SUCT BULB TIP NO VENT (SUCTIONS) ×3 IMPLANT

## 2014-03-01 NOTE — Anesthesia Preprocedure Evaluation (Signed)
Anesthesia Evaluation  Patient identified by MRN, date of birth, ID band Patient awake    Reviewed: Allergy & Precautions, H&P , NPO status , Patient's Chart, lab work & pertinent test results  Airway Mallampati: II  Neck ROM: full    Dental   Pulmonary former smoker,          Cardiovascular hypertension, + CAD, + Past MI and + Cardiac Stents + dysrhythmias Atrial Fibrillation     Neuro/Psych    GI/Hepatic   Endo/Other  diabetes, Type 2  Renal/GU      Musculoskeletal   Abdominal   Peds  Hematology   Anesthesia Other Findings   Reproductive/Obstetrics                           Anesthesia Physical Anesthesia Plan  ASA: III  Anesthesia Plan: General and Regional   Post-op Pain Management:    Induction: Intravenous  Airway Management Planned: LMA  Additional Equipment:   Intra-op Plan:   Post-operative Plan:   Informed Consent: I have reviewed the patients History and Physical, chart, labs and discussed the procedure including the risks, benefits and alternatives for the proposed anesthesia with the patient or authorized representative who has indicated his/her understanding and acceptance.     Plan Discussed with: CRNA, Anesthesiologist and Surgeon  Anesthesia Plan Comments:         Anesthesia Quick Evaluation

## 2014-03-01 NOTE — Transfer of Care (Signed)
Immediate Anesthesia Transfer of Care Note  Patient: Melody Casey  Procedure(s) Performed: Procedure(s): BREAST LUMPECTOMY WITH NEEDLE LOCALIZATION AND AXILLARY SENTINEL LYMPH NODE BX (Right)  Patient Location: PACU  Anesthesia Type:GA combined with regional for post-op pain  Level of Consciousness: awake, alert , oriented and patient cooperative  Airway & Oxygen Therapy: Patient Spontanous Breathing and Patient connected to face mask oxygen  Post-op Assessment: Report given to PACU RN and Post -op Vital signs reviewed and stable  Post vital signs: Reviewed and stable  Complications: No apparent anesthesia complications

## 2014-03-01 NOTE — Anesthesia Postprocedure Evaluation (Signed)
  Anesthesia Post-op Note  Patient: Melody Casey  Procedure(s) Performed: Procedure(s): BREAST LUMPECTOMY WITH NEEDLE LOCALIZATION AND AXILLARY SENTINEL LYMPH NODE BX (Right)  Patient Location: PACU  Anesthesia Type:General  Level of Consciousness: awake and alert   Airway and Oxygen Therapy: Patient Spontanous Breathing  Post-op Pain: mild  Post-op Assessment: Post-op Vital signs reviewed, Patient's Cardiovascular Status Stable and Respiratory Function Stable  Post-op Vital Signs: Reviewed  Filed Vitals:   03/01/14 1255  BP: 121/46  Pulse: 73  Temp:   Resp: 18    Complications: No apparent anesthesia complications

## 2014-03-01 NOTE — Progress Notes (Signed)
  Assisted Dr. Marcie Bal with right, ultrasound guided, pectoralis block. Side rails up, monitors on throughout procedure. See vital signs in flow sheet. Tolerated Procedure well.

## 2014-03-01 NOTE — Discharge Instructions (Signed)
[facility] °Office Phone Number [phone] ° °BREAST BIOPSY/ PARTIAL MASTECTOMY: POST OP INSTRUCTIONS ° °Always review your discharge instruction sheet given to you by the facility where your surgery was performed. ° °IF YOU HAVE DISABILITY OR FAMILY LEAVE FORMS, YOU MUST BRING THEM TO THE OFFICE FOR PROCESSING.  DO NOT GIVE THEM TO YOUR DOCTOR. ° °1. A prescription for pain medication may be given to you upon discharge.  Take your pain medication as prescribed, if needed.  If narcotic pain medicine is not needed, then you may take acetaminophen (Tylenol) or ibuprofen (Advil) as needed. °2. Take your usually prescribed medications unless otherwise directed °3. If you need a refill on your pain medication, please contact your pharmacy.  They will contact our office to request authorization.  Prescriptions will not be filled after 5pm or on week-ends. °4. You should eat very light the first 24 hours after surgery, such as soup, crackers, pudding, etc.  Resume your normal diet the day after surgery. °5. Most patients will experience some swelling and bruising in the breast.  Ice packs and a good support bra will help.  Swelling and bruising can take several days to resolve.  °6. It is common to experience some constipation if taking pain medication after surgery.  Increasing fluid intake and taking a stool softener will usually help or prevent this problem from occurring.  A mild laxative (Milk of Magnesia or Miralax) should be taken according to package directions if there are no bowel movements after 48 hours. °7. Unless discharge instructions indicate otherwise, you may remove your bandages 24-48 hours after surgery, and you may shower at that time.  You may have steri-strips (small skin tapes) in place directly over the incision.  These strips should be left on the skin for 7-10 days.  If your surgeon used skin glue on the incision, you may shower in 24 hours.  The glue will flake off over the  next 2-3 weeks.  Any sutures or staples will be removed at the office during your follow-up visit. °8. ACTIVITIES:  You may resume regular daily activities (gradually increasing) beginning the next day.  Wearing a good support bra or sports bra minimizes pain and swelling.  You may have sexual intercourse when it is comfortable. °a. You may drive when you no longer are taking prescription pain medication, you can comfortably wear a seatbelt, and you can safely maneuver your car and apply brakes. °b. RETURN TO WORK:  ______________________________________________________________________________________ °9. You should see your doctor in the office for a follow-up appointment approximately two weeks after your surgery.  Your doctor’s nurse will typically make your follow-up appointment when she calls you with your pathology report.  Expect your pathology report 2-3 business days after your surgery.  You may call to check if you do not hear from us after three days. °10. OTHER INSTRUCTIONS: _______________________________________________________________________________________________ _____________________________________________________________________________________________________________________________________ °_____________________________________________________________________________________________________________________________________ °_____________________________________________________________________________________________________________________________________ ° °WHEN TO CALL YOUR DOCTOR: °1. Fever over 101.0 °2. Nausea and/or vomiting. °3. Extreme swelling or bruising. °4. Continued bleeding from incision. °5. Increased pain, redness, or drainage from the incision. ° °The clinic staff is available to answer your questions during regular business hours.  Please don’t hesitate to call and ask to speak to one of the nurses for clinical concerns.  If you have a medical emergency, go to the nearest  emergency room or call 911.  A surgeon from [facility] is always on call at the hospital. ° °For further questions, please visit [email or web address]  ° ° °  Post Anesthesia Home Care Instructions ° °Activity: °Get plenty of rest for the remainder of the day. A responsible adult should stay with you for 24 hours following the procedure.  °For the next 24 hours, DO NOT: °-Drive a car °-Operate machinery °-Drink alcoholic beverages °-Take any medication unless instructed by your physician °-Make any legal decisions or sign important papers. ° °Meals: °Start with liquid foods such as gelatin or soup. Progress to regular foods as tolerated. Avoid greasy, spicy, heavy foods. If nausea and/or vomiting occur, drink only clear liquids until the nausea and/or vomiting subsides. Call your physician if vomiting continues. ° °Special Instructions/Symptoms: °Your throat may feel dry or sore from the anesthesia or the breathing tube placed in your throat during surgery. If this causes discomfort, gargle with warm salt water. The discomfort should disappear within 24 hours. ° °

## 2014-03-01 NOTE — Op Note (Signed)
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 A second lesion seen on MRI 2.5 cm medial and inferior to this area has been biopsied as well showing atypical ductal hyperplasia. After extensive preoperative discussion detailed elsewhere we have elected to proceed with breast conservation with needle localized lumpectomy to include the area of ADH and right axillary sentinel lymph node biopsy. The nature of the surgery and risks have been discussed in detail elsewhere.    Procedure Detail:  Following needle localization of both the areas of invasive cancer and ADH in the right breast with separate localization wires the patient was brought to the holding area and 1 mCi of technetium sulfur colloid was injected intradermally around the right nipple by nuclear medicine. Patient was then brought to the operating room, placed in the supine position on the operating table, and laryngeal mask general anesthesia was induced. The entire right breast and axilla were widely sterilely prepped and draped. She received preoperative IV antibiotics. Patient timeout was performed and correct procedure verified. Under sterile technique I had injected 5 cc of dilute methylene blue beneath the right nipple and massaged this for several minutes. A curvilinear incision was made in the upper breast at the site of the invasive cancer and dissection was carried down into the subcutaneous tissue using cautery. As the breast capsule was approached and excised  widely and a generous specimen of breast tissue around the shaft and tip of the wire in order to obtain negative margins. The specimen was removed and oriented with ink. Specimen mammography showed the wire and clip to be within the center of the specimen. The more medial wire localizing the ADH was then approached through the same incision dissecting in the deep subcutaneous tissue inferiorly and medially to encounter the wire which was brought into the incision and then a somewhat smaller specimen of breast tissue was excised around the shaft and the tip of the wire. This essentially excised some further medial and inferior margin of the original lumpectomy. Specimen mammography was obtained of this lesion showing the clip and the wire within the specimen. It was oriented with sutures. Attention was then turned to the sentinel lymph node biopsy. The neoprobe was used to locate an area of identifiable counts in the right axilla the counts actually were very low. A transverse incision was made in the axilla somewhat posteriorly directed by the neoprobe and dissection was carried down through the subcutaneous tissue and the clavipectoral fascia incised. Using the neoprobe for guidance I dissected down into the posterior axilla and found a definite blue lymph node which was completely excised with cautery. Ex vivo the node had counts of about 30 and carefully examined with the neoprobe throughout the axilla counts were 0 in all areas. There was no other palpable lymph nodes in the axilla. This was sent as hot blue right axillary sentinel lymph node. The axillary tissue and subcutaneous tissue this incision was infiltrated with Marcaine. The lumpectomy cavity the breast was marked with clips. Hemostasis was obtained in both areas. The depressed maxillary  tissue were closed with interrupted 3-0 Vicryl. The subcutaneous was closed with interrupted 3-0 Vicryl and the skin closed with subcuticular 5-0 Monocryl and  Dermabond. Sponge needle and instrument counts were correct.    Findings: As above  Estimated Blood Loss:  Minimal         Drains: none  Blood Given: none          Specimens: #1 right breast lumpectomy for invasive and in situ cancer    #2 right breast lumpectomy for atypical ductal hyperplasia     #3 right axillary sentinel lymph node        Complications:  * No complications entered in OR log *         Disposition: PACU - hemodynamically stable.         Condition: stable

## 2014-03-01 NOTE — Interval H&P Note (Signed)
History and Physical Interval Note:  03/01/2014 10:16 AM  Melody Casey  has presented today for surgery, with the diagnosis of cancer of right breast  The various methods of treatment have been discussed with the patient and family. After consideration of risks, benefits and other options for treatment, the patient has consented to  Procedure(s): BREAST LUMPECTOMY WITH NEEDLE LOCALIZATION AND AXILLARY SENTINEL LYMPH NODE BX (Right) as a surgical intervention .  The patient's history has been reviewed, patient examined, no change in status, stable for surgery.  I have reviewed the patient's chart and labs.  Questions were answered to the patient's satisfaction.     Veena Sturgess T

## 2014-03-01 NOTE — Anesthesia Procedure Notes (Addendum)
Procedure Name: LMA Insertion Date/Time: 03/01/2014 10:45 AM Performed by: BLOCKER, TIMOTHY Pre-anesthesia Checklist: Patient identified, Emergency Drugs available, Suction available and Patient being monitored Patient Re-evaluated:Patient Re-evaluated prior to inductionOxygen Delivery Method: Circle System Utilized Preoxygenation: Pre-oxygenation with 100% oxygen Intubation Type: IV induction Ventilation: Mask ventilation without difficulty LMA: LMA inserted LMA Size: 4.0 Number of attempts: 1 Airway Equipment and Method: bite block Placement Confirmation: positive ETCO2 Tube secured with: Tape Dental Injury: Teeth and Oropharynx as per pre-operative assessment    Anesthesia Regional Block:  Pectoralis block  Pre-Anesthetic Checklist: ,, timeout performed, Correct Patient, Correct Site, Correct Laterality, Correct Procedure, Correct Position, site marked, Risks and benefits discussed,  Surgical consent,  Pre-op evaluation,  At surgeon's request and post-op pain management  Laterality: Right  Prep: chloraprep       Needles:  Injection technique: Single-shot  Needle Type: Echogenic Needle     Needle Length: 9cm 9 cm Needle Gauge: 21 and 21 G    Additional Needles:  Procedures: ultrasound guided (picture in chart) Pectoralis block Narrative:  Start time: 03/01/2014 10:21 AM End time: 03/01/2014 10:33 AM Injection made incrementally with aspirations every 5 mL.  Performed by: Personally  Anesthesiologist: Dr Marcie Bal

## 2014-03-01 NOTE — H&P (View-Only) (Signed)
Chief Complaint: New diagnosis of breast cancer  History:    Melody Casey is a 68 y.o. postmenopausal female referred by Dr. 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 She recently presented for a screening mamogram revealing a new small cluster of heterogeneous calcifications in the right breast at the 12:00 position suspicious for malignancy..  Subsequent imaging included diagnostic mamogram showing confirmation of these findings.   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 At that time some additional calcifications were seen in the right breast and large core needle biopsy set was performed on February 25 showing benign breast parenchyma with calcifications. Subsequent breast MR was performed showing no abnormal enhancement in the area of no malignancy but a 7 mm enhancing mass in the lower inner quadrant of the right breast 2.5 cm inferior and medial to the clip from stereotactic biopsy. Subsequent MR guided biopsy of this abnormality was performed. A dumbbell clip was placed. Pathology here revealed atypical ductal hyperplasia. She is seen now in multidisciplinary breast clinic for initial treatment planning.  She has experienced no unusual symptoms such as lump, nipple discharge or skin changes..  She does not have a personal history of any previous breast problems.  Findings at that time were the following:  Tumor size: less than 1  cm  Tumor grade: 1  Estrogen Receptor: positive Progesterone Receptor: positive  Her-2 neu: negative  Lymph node status: negative Neurovascular invasion: no Lymphatic invasion: no  Past Medical History  Diagnosis Date  . HLD (hyperlipidemia)   . HTN (hypertension)   . Arthritis   . Foot deformity     right  . CAD (coronary artery disease)     a. NSTEMI 02/2011: occ mid Cx, DES to OM2, residual nonobst LAD dz.  . Tobacco abuse   .  Diabetes mellitus   . Atrial fibrillation     a. Dx 03/2011 - on tikosyn/coumadin.  . Anxiety   . Diverticulitis of colon 2008 and 04/2011  . Ureteral obstruction     History of gross hematuria/right hydronephrosis 2/2 to uteropelvic junction obstruction, s/p cystoscopy in January 2007 with bilateral retrograde pyelography, right ureter arthroscopy, right ureteral stent placement, bladder biopsies, stent removal since then.  . Heart attack     Past Surgical History  Procedure Laterality Date  . Tonsillectomy and adenoidectomy    . Knee arthroscopy      left  . Tubal ligation    . 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 Ureteral surgery    . Coronary stent placement      Current Outpatient Prescriptions  Medication Sig Dispense Refill  . ALPRAZolam (XANAX) 0.25 MG tablet Take 0.25 mg by mouth daily as needed for anxiety.       Marland Kitchen amLODipine (NORVASC) 2.5 MG tablet Take 1 tablet (2.5 mg total) by mouth daily.  90 tablet  3  . aspirin 81 MG tablet Take 81 mg by mouth every morning.       Marland Kitchen atorvastatin (LIPITOR) 40 MG tablet TAKE ONE AND ONE-HALF TABLETS BY MOUTH IN THE EVENING  135 tablet  0  . ciprofloxacin (CIPRO) 500 MG tablet Take 1 tablet (500 mg total) by mouth 2 (two) times daily.  20 tablet  0  . dofetilide (TIKOSYN) 500 MCG capsule Take 1 capsule (500 mcg total) by mouth 2 (two)  times daily.  60 capsule  11  . ibuprofen (ADVIL,MOTRIN) 200 MG tablet Take 200-400 mg by mouth daily as needed. For pain      . lisinopril (PRINIVIL,ZESTRIL) 40 MG tablet Take 40 mg by mouth every morning.       . metFORMIN (GLUMETZA) 500 MG (MOD) 24 hr tablet Take 500 mg by mouth every evening.       . metroNIDAZOLE (FLAGYL) 500 MG tablet Take 1 tablet (500 mg total) by mouth 4 (four) times daily. One po bid x 7 days  40 tablet  0  . niacin (NIASPAN) 500 MG CR tablet Take 500 mg by mouth at bedtime.        . nitroGLYCERIN (NITROSTAT) 0.4 MG SL tablet Place 0.4 mg under  the tongue every 5 (five) minutes as needed.        . potassium chloride SA (K-DUR,KLOR-CON) 20 MEQ tablet Take 20 mEq by mouth 2 (two) times daily.        . traMADol (ULTRAM) 50 MG tablet Take 1 tablet (50 mg total) by mouth every 6 (six) hours as needed.  20 tablet  0  . warfarin (COUMADIN) 5 MG tablet Take 5-7.5 mg by mouth every evening. Take 7.5 mg on wednesdays only.  Take 5 mg all other days       No current facility-administered medications for this visit.    Family History  Problem Relation Age of Onset  . Colon cancer Neg Hx   . Liver disease Neg Hx   . Lung cancer Father 39    deceased  . Heart failure Mother 30    deceased    History   Social History  . Marital Status: Married    Spouse Name: N/A    Number of Children: 2  . 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 Smoking status: Former Smoker -- 2.00 packs/day for 50 years    Types: Cigarettes    Quit date: 02/08/2011  . Smokeless tobacco: Not on file  . Alcohol Use: No  . Drug Use: No  . Sexual Activity: Not on file   Other Topics Concern  . 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      Review of Systems Ears, nose, mouth, throat, and face: negative    respiratory: No shortness of breath cough or wheezing Cardiac: Occasional palpitations. No chest pain or edema Abdomen: Occasional left lower quadrant pain related to diverticulitis Extremities: Some chronic joint pain particularly left knee Hematologic: No history of abnormal bleeding or blood clot  Objective:  There were no vitals taken for this visit.  General: Alert, well-developed Caucasian female, in no distress Skin: Warm and dry without rash or infection. HEENT: No palpable masses or thyromegaly. Sclera nonicteric. Pupils equal round and reactive. Oropharynx clear. Breasts: some bruising of the right breast post core biopsy.  I cannot feel any masses in either breast. No skin changes or nipple discharge. No palpable axillary adenopathy. Lymph nodes: No cervical, supraclavicular, or inguinal nodes palpable. Lungs: Breath sounds clear and equal without increased work of breathing Cardiovascular: Regular rate and rhythm without murmur. No JVD or edema. Peripheral pulses intact. Abdomen: Nondistended. Soft and nontender. No masses palpable. No organomegaly. No palpable hernias. Extremities: No edema or joint swelling or deformity. No chronic venous stasis changes. Neurologic: Alert and fully oriented. Gait  normal.   Laboratory data:  CBC:  Lab Results  Component Value Date   WBC 4.7 02/17/2014   WBC 6.2 10/24/2012   RBC 4.56 02/17/2014   RBC 4.42 10/24/2012   HGB 11.8 02/17/2014   HGB 12.7 10/24/2012   HCT 36.6 02/17/2014   HCT 37.5 10/24/2012   PLT 187 02/17/2014   PLT 218 10/24/2012  ]  [facility] Labs:  Lab Results  Component Value Date   NA 140 02/17/2014   NA 138 10/24/2012   K 4.6 02/17/2014   K 4.6 10/24/2012   CL 104 10/24/2012   CO2 24 02/17/2014   CO2 26 10/24/2012   BUN 15.6 02/17/2014   BUN 15 10/24/2012   CREATININE 0.8 02/17/2014   CREATININE 0.69 10/24/2012   CREATININE 0.57 05/23/2011   CALCIUM 10.1 02/17/2014   CALCIUM 9.5 10/24/2012   PROT 8.1 02/17/2014   PROT 7.5 10/24/2012   BILITOT 0.39 02/17/2014   BILITOT 0.4 10/24/2012   BILIDIR 0.1 04/05/2008   ALKPHOS 122 02/17/2014   ALKPHOS 125* 10/24/2012   AST 22 02/17/2014   AST 21 10/24/2012   ALT 22 02/17/2014   ALT 18 10/24/2012     Assessment  68 y.o. female with a new diagnosis of cancer of the the right breast upper breast 12:00.  Clinical IA, (T1BNO) estrogen receptor positive. I discussed with the patient and family members present today initial surgical treatment options. We discussed options of breast conservation with lumpectomy or total mastectomy and sentinal lymph node biopsy/dissection. Options for reconstruction were discussed.  After discussion they have elected to proceed with right partial mastectomy with axillary sentinel lymph node biopsy. We discussed that we would plan to remove the adjacent area of atypical ductal hyperplasia at the same time..  We discussed the indications and nature of the procedure, and expected recovery, in detail. Surgical risks including anesthetic complications, cardiorespiratory complications, bleeding, infection, wound healing complications, blood clots, lymphedema, local and distant recurrence and possible need for further surgery based on the final pathology was discussed and understood.  Chemotherapy, hormonal therapy and radiation therapy have been discussed. They have been provided with literature regarding the treatment of breast cancer.  All questions were answered. They understand and agree to proceed and we will go ahead with scheduling. We will need to obtain cardiology clearance preoperatively and stop Coumadin for 5 days  Plan right partial mastectomy right axillary sentinel lymph node biopsy. We will plan to excise the adjacent node area of atypical ductal hyperplasia as well. We will need preoperative cardiac clearance and stopped Coumadin 5 days in advance.  Edward Jolly MD, FACS  02/17/2014, 2:18 PM

## 2014-03-02 ENCOUNTER — Encounter (HOSPITAL_BASED_OUTPATIENT_CLINIC_OR_DEPARTMENT_OTHER): Payer: Self-pay | Admitting: General Surgery

## 2014-03-05 ENCOUNTER — Other Ambulatory Visit (INDEPENDENT_AMBULATORY_CARE_PROVIDER_SITE_OTHER): Payer: Self-pay

## 2014-03-05 ENCOUNTER — Telehealth (INDEPENDENT_AMBULATORY_CARE_PROVIDER_SITE_OTHER): Payer: Self-pay | Admitting: General Surgery

## 2014-03-05 DIAGNOSIS — C50919 Malignant neoplasm of unspecified site of unspecified female breast: Secondary | ICD-10-CM

## 2014-03-05 NOTE — Telephone Encounter (Signed)
Called the patient and discussed the pathology report

## 2014-03-08 ENCOUNTER — Telehealth: Payer: Self-pay | Admitting: *Deleted

## 2014-03-08 NOTE — Telephone Encounter (Signed)
Called pt and confirmed 03/12/14 appt w/ her.  Emailed Christy at [employer] to make her aware.

## 2014-03-09 ENCOUNTER — Telehealth: Payer: Self-pay | Admitting: *Deleted

## 2014-03-10 ENCOUNTER — Telehealth (INDEPENDENT_AMBULATORY_CARE_PROVIDER_SITE_OTHER): Payer: Self-pay | Admitting: *Deleted

## 2014-03-10 NOTE — Telephone Encounter (Signed)
Discussed path

## 2014-03-10 NOTE — Telephone Encounter (Signed)
No Precert required for referral to Radiation Oncology: appt 7.37.10 No Precert required for referral to Oncolgy: appt 4.3.15 Pt aware  Anderson Malta

## 2014-03-12 ENCOUNTER — Telehealth: Payer: Self-pay | Admitting: Oncology

## 2014-03-12 ENCOUNTER — Ambulatory Visit (HOSPITAL_BASED_OUTPATIENT_CLINIC_OR_DEPARTMENT_OTHER): Payer: Medicare Other | Admitting: Oncology

## 2014-03-12 VITALS — BP 136/76 | HR 61 | Temp 98.3°F | Resp 18 | Ht 67.25 in | Wt 181.5 lb

## 2014-03-12 DIAGNOSIS — Z170 Estrogen receptor positive status [ER+]: Secondary | ICD-10-CM

## 2014-03-12 DIAGNOSIS — I4891 Unspecified atrial fibrillation: Secondary | ICD-10-CM

## 2014-03-12 DIAGNOSIS — F32A Depression, unspecified: Secondary | ICD-10-CM

## 2014-03-12 DIAGNOSIS — Z8739 Personal history of other diseases of the musculoskeletal system and connective tissue: Secondary | ICD-10-CM

## 2014-03-12 DIAGNOSIS — F329 Major depressive disorder, single episode, unspecified: Secondary | ICD-10-CM

## 2014-03-12 DIAGNOSIS — C50411 Malignant neoplasm of upper-outer quadrant of right female breast: Secondary | ICD-10-CM

## 2014-03-12 DIAGNOSIS — I2510 Atherosclerotic heart disease of native coronary artery without angina pectoris: Secondary | ICD-10-CM

## 2014-03-12 DIAGNOSIS — C50419 Malignant neoplasm of upper-outer quadrant of unspecified female breast: Secondary | ICD-10-CM | POA: Diagnosis not present

## 2014-03-12 NOTE — Telephone Encounter (Signed)
, °

## 2014-03-12 NOTE — Progress Notes (Signed)
[place]  [phone] [phone]     ID: Melody Casey OB: 1946-07-30  MR#: [id number]  GXQ#:[id number]  PCP: Melody Ou, MD GYN:   SU: Excell Seltzer OTHER MD: Melody Casey, Melody Casey  CHIEF COMPLAINT: "I had by routine mammogram and a found cancer"   BREAST CANCER HISTORY: "Melody Casey" had routine breast mammography at [facility] 01/14/2014. The breast density was category B. A new cluster of heterogeneous calcifications were noted in the right breast and on 01/28/2014 the patient underwent stereotactic biopsy of this area with the pathology ([id number]) showing an invasive ductal carcinoma, grade 1, estrogen receptor 100% positive, with strong staining intensity; progesterone receptor 63% positive, with moderate staining intensity, with an MIB-1 of 26% and no HER-2 amplification, the ratio being 1.44 and the number per cell 2.80.  On 02/01/2014 the patient underwent bilateral MRI of the breast, with no enhancement associated with the previously biopsied area. However there was a separate 7 mm enhancing mass in the lower inner quadrant and biopsy of this mass 02/03/2014 showed benign breast parenchyma, with no atypia. Review of this pathology showed atypical ductal hyperplasia, and therefore excisional biopsy was recommended, performed 02/10/2014 and showing only atypical ductal hyperplasia, with adenosis and calcifications ([id number]).  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 tumor. Her subsequent history is as detailed below  INTERVAL HISTORY: Melody Casey returns today for followup of her breast cancer. Since her last visit here Melody Casey underwent biopsy of 2 additional areas in her right breast, both of which showed no evidence of malignancy. She then underwent a right lumpectomy  and sentinel lymph node sampling 03/01/2014. This showed [id number]) a 3 mm area of invasive ductal carcinoma, grade 1, with a negative sentinel lymph node. Margins were positive. A prognostic panel was not repeated.  REVIEW OF SYSTEMS: She did fine with the surgery but 2 days later, on 3/25, she had an attack of diverticulitis which took her to bed for 48 hours. She already had prescriptions for Flagyl and Cipro as well as tramadol and hand and that took care of the problem. She also has discovered that she has latex allergy. There were no bleeding, swelling, redness, fever, or unusual pain associated with her multiple biopsies and lumpectomy. A detailed review of systems today was noncontributory  PAST MEDICAL HISTORY: Past Medical History  Diagnosis Date  . HLD (hyperlipidemia)   . HTN (hypertension)   . Arthritis   . Foot deformity     right  . CAD (coronary artery disease)     a. NSTEMI 02/2011: occ mid Cx, DES to OM2, residual nonobst LAD dz.  . Tobacco abuse     stopped smoking 2012  . Diabetes mellitus   . Atrial fibrillation     a. Dx 03/2011 - on tikosyn/coumadin.  . Anxiety   . Diverticulitis of colon 2008 and 04/2011  . Ureteral obstruction     History of gross hematuria/right hydronephrosis 2/2 to uteropelvic junction obstruction, s/p cystoscopy in January 2007 with bilateral retrograde pyelography, right ureter arthroscopy, right ureteral stent placement, bladder biopsies, stent removal since then.  . Heart attack   . Wears dentures     top    PAST SURGICAL HISTORY: Past Surgical History  Procedure Laterality Date  . Tonsillectomy and adenoidectomy    . Knee  arthroscopy      left  . Tubal ligation    . Right leg surgery  age 70    As a child for  ?scleroderma per patient  . Ureteral surgery  2011    rt ureterostomy-stent  . Coronary stent placement  2012  . Cardiac catheterization  2012    stent  . Breast lumpectomy with needle localization and axillary sentinel  lymph node bx Right 03/01/2014    Procedure: BREAST LUMPECTOMY WITH NEEDLE LOCALIZATION AND AXILLARY SENTINEL LYMPH NODE BX;  Surgeon: Melody Jolly, MD;  Location: [place];  Service: General;  Laterality: Right;    FAMILY HISTORY Family History  Problem Relation Age of Onset  . Colon cancer Neg Hx   . Liver disease Neg Hx   . Lung cancer Father 43    deceased  . Heart failure Mother 16    deceased  The patient's father died at the age of 27 with a history of lung cancer. He was a smoker and also had asbestos exposure. The patient's mother died at the age of 73. The patient had one brother. There is no other history of cancer in the family to her knowledge  GYNECOLOGIC HISTORY:  Menarche age 48, first live birth age 49. The patient stopped having periods approximately 1997. She did not take hormone replacement.   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 Her husband of 50 years, Melody Casey is a retired Futures trader. He has a history of [facility] and [facility]. Daughter Melody Casey lives in [name] and is a Scientist, clinical (histocompatibility and immunogenetics). Son Melody Casey lives in [name] and he is an Conservator, museum/gallery. The patient has 7 grandchildren and 2 great-grandchildren. She attends [employer]     ADVANCED DIRECTIVES:  in place    HEALTH MAINTENANCE: History  Substance Use Topics  . Smoking status: Former Smoker -- 2.00 packs/day for 50 years    Types: Cigarettes    Quit date: 02/08/2011  . Smokeless tobacco: Not on file  . Alcohol Use: No     Colonoscopy: never   PAP: 2013  Bone density:Never  Lipid panel:  Allergies  Allergen Reactions  . Acetaminophen Hives  . Banana Other (See Comments)    Unknown  . Oxycodone-Acetaminophen Itching  . Penicillins Other (See Comments)    Pt had so much as a child she's immune to it  . Prednisone  Itching  . Miralax [Polyethylene Glycol] Swelling and Rash    Took [facility] brand developed rash. Patient states she tolerated name brand MiraLax in the past.  . Sulfa Antibiotics Rash    Current Outpatient Prescriptions  Medication Sig Dispense Refill  . ALPRAZolam (XANAX) 0.25 MG tablet Take 0.25 mg by mouth daily as needed for anxiety.       Marland Kitchen amLODipine (NORVASC) 2.5 MG tablet Take 1 tablet (2.5 mg total) by mouth daily.  90 tablet  3  . aspirin 81 MG tablet Take 81 mg by mouth every morning.       Marland Kitchen atorvastatin (LIPITOR) 40 MG tablet TAKE ONE AND ONE-HALF TABLETS BY MOUTH IN THE EVENING  135 tablet  0  . ciprofloxacin (CIPRO) 500 MG tablet Take 500 mg by mouth 2 (two) times daily. As needed      . dofetilide (TIKOSYN) 500 MCG capsule Take 1 capsule (500 mcg total) by mouth 2 (two)  times daily.  60 capsule  11  . ibuprofen (ADVIL,MOTRIN) 200 MG tablet Take 200-400 mg by mouth daily as needed. For pain      . lisinopril (PRINIVIL,ZESTRIL) 40 MG tablet Take 40 mg by mouth every morning.       . metFORMIN (GLUMETZA) 500 MG (MOD) 24 hr tablet Take 500 mg by mouth every evening.       . metroNIDAZOLE (FLAGYL) 500 MG tablet Take 500 mg by mouth 4 (four) times daily. One po bid x 7 days As needed      . niacin (NIASPAN) 500 MG CR tablet Take 500 mg by mouth at bedtime.       . nitroGLYCERIN (NITROSTAT) 0.4 MG SL tablet Place 0.4 mg under the tongue every 5 (five) minutes as needed.        . potassium chloride SA (K-DUR,KLOR-CON) 20 MEQ tablet Take 20 mEq by mouth 2 (two) times daily.        . traMADol (ULTRAM) 50 MG tablet Take 1-2 tablets (50-100 mg total) by mouth every 6 (six) hours as needed.  30 tablet  0  . warfarin (COUMADIN) 5 MG tablet Take 5-7.5 mg by mouth every evening. Take 7.5 mg on wednesdays only.  Take 5 mg all other days       No current facility-administered medications for this visit.    OBJECTIVE: Older white woman in no acute distress Filed Vitals:   03/12/14 1125  BP:  136/76  Pulse: 61  Temp: 98.3 F (36.8 C)  Resp: 18     Body mass index is 28.22 kg/(m^2).    ECOG FS:1 - Symptomatic but completely ambulatory  Ocular: Sclerae unicteric, EOMs intact Ear-nose-throat: Oropharynx clear and moist  Lymphatic: No cervical or supraclavicular adenopathy Lungs no rales or rhonchi, good excursion bilaterally Heart regular rate and rhythm today , no murmur appreciated Abd soft, nontender, positive bowel sounds MSK no focal spinal tenderness, no joint edema Neuro: non-focal, well-oriented, appropriate affect Breasts:  The right breast is status post lumpectomy. The incision is healing very nicely and the cosmetic result is excellent. There is no evidence of residual or recurrent disease. The right axilla is benign. The left breast is unremarkable   LAB RESULTS:  CMP     Component Value Date/Time   NA 140 02/17/2014 1206   NA 138 10/24/2012 1450   K 4.6 02/17/2014 1206   K 4.6 10/24/2012 1450   CL 104 10/24/2012 1450   CO2 24 02/17/2014 1206   CO2 26 10/24/2012 1450   GLUCOSE 103 02/17/2014 1206   GLUCOSE 110* 10/24/2012 1450   BUN 15.6 02/17/2014 1206   BUN 15 10/24/2012 1450   CREATININE 0.8 02/17/2014 1206   CREATININE 0.69 10/24/2012 1450   CREATININE 0.57 05/23/2011 1300   CALCIUM 10.1 02/17/2014 1206   CALCIUM 9.5 10/24/2012 1450   PROT 8.1 02/17/2014 1206   PROT 7.5 10/24/2012 1450   ALBUMIN 3.8 02/17/2014 1206   ALBUMIN 3.5 10/24/2012 1450   AST 22 02/17/2014 1206   AST 21 10/24/2012 1450   ALT 22 02/17/2014 1206   ALT 18 10/24/2012 1450   ALKPHOS 122 02/17/2014 1206   ALKPHOS 125* 10/24/2012 1450   BILITOT 0.39 02/17/2014 1206   BILITOT 0.4 10/24/2012 1450   GFRNONAA 87* 10/24/2012 1450   GFRAA >90 10/24/2012 1450    I No results found for this basename: SPEP,  UPEP,   kappa and lambda light chains    Lab Results  Component Value Date   WBC 4.7 02/17/2014   NEUTROABS 2.6 02/17/2014   HGB 11.8 02/17/2014   HCT 36.6 02/17/2014   MCV 80.2  02/17/2014   PLT 187 02/17/2014      Chemistry      Component Value Date/Time   NA 140 02/17/2014 1206   NA 138 10/24/2012 1450   K 4.6 02/17/2014 1206   K 4.6 10/24/2012 1450   CL 104 10/24/2012 1450   CO2 24 02/17/2014 1206   CO2 26 10/24/2012 1450   BUN 15.6 02/17/2014 1206   BUN 15 10/24/2012 1450   CREATININE 0.8 02/17/2014 1206   CREATININE 0.69 10/24/2012 1450   CREATININE 0.57 05/23/2011 1300      Component Value Date/Time   CALCIUM 10.1 02/17/2014 1206   CALCIUM 9.5 10/24/2012 1450   ALKPHOS 122 02/17/2014 1206   ALKPHOS 125* 10/24/2012 1450   AST 22 02/17/2014 1206   AST 21 10/24/2012 1450   ALT 22 02/17/2014 1206   ALT 18 10/24/2012 1450   BILITOT 0.39 02/17/2014 1206   BILITOT 0.4 10/24/2012 1450       No results found for this basename: LABCA2    No components found with this basename: LABCA125    No results found for this basename: INR,  in the last 168 hours  Urinalysis    Component Value Date/Time   COLORURINE YELLOW 10/24/2012 [name] 10/24/2012 1750   LABSPEC 1.015 10/24/2012 1750   PHURINE 6.5 10/24/2012 1750   GLUCOSEU NEGATIVE 10/24/2012 1750   GLUCOSEU NEGATIVE 04/05/2008 0833   HGBUR NEGATIVE 10/24/2012 1750   BILIRUBINUR NEGATIVE 10/24/2012 1750   KETONESUR NEGATIVE 10/24/2012 1750   PROTEINUR NEGATIVE 10/24/2012 1750   UROBILINOGEN 0.2 10/24/2012 1750   NITRITE NEGATIVE 10/24/2012 1750   LEUKOCYTESUR LARGE* 10/24/2012 1750    STUDIES: 02/11/2014   ADDENDUM REPORT: 02/11/2014 10:52  ADDENDUM: Pathology results have become available and indicate fibrocystic change and adenosis with atypical ductal hyperplasia. Therefore, excisional biopsy of this area is recommended. I gave these results and recommendations to the patient by telephone at 10:30 a.m. on 02/11/2014. The patient has a pending appointment with the multidisciplinary clinic on March 11, 2,015. She reports no complications or complaints related to the biopsy.    Electronically Signed   By: Skipper Cliche M.D.   On: 02/11/2014 10:52   02/11/2014   CLINICAL DATA:  Recent diagnosis of DCIS in the upper right breast on stereotactic biopsy. Indeterminate 7 mm enhancing mass in the inner right breast on the pre-treatment MRI.  LABS:  BUN and creatinine were obtained on site at [place] at [address]  Results: BUN 21 mg/dL, Creatinine 0.6 mg/dL, estimated GFR greater than 60.  EXAM: MRI GUIDED CORE NEEDLE BIOPSY OF THE RIGHT BREAST  TECHNIQUE: Multiplanar, multisequence MR imaging of the right breast was performed both before and after administration of intravenous contrast.  CONTRAST:  17 cc Multihance IV.  COMPARISON:  Bilateral breast MRI 02/01/2014.  FINDINGS: I met with the patient, and we discussed the procedure of MRI guided biopsy, including risks, benefits, and alternatives. Specifically, we discussed the risks of infection, bleeding, tissue injury, clip migration, and inadequate sampling. Informed, written consent was given. The usual time out protocol was performed immediately prior to the procedure.  Using sterile technique, 2% Lidocaine, MRI guidance, and an automated 9 gauge vacuum assisted device, biopsy was performed of the 7 mm mass in the inner right breast, middle 1/3,  using a medial approach. At the conclusion of the procedure, a dumbbell shaped tissue marker clip was deployed into the biopsy cavity. Follow-up 2-view mammogram was performed and dictated separately.  IMPRESSION: MRI guided biopsy of an indeterminate 7 mm mass in the inner right breast. No apparent complications.  Electronically Signed: By: Evangeline Dakin M.D. On: 02/10/2014 10:26    ASSESSMENT: 68 y.o. [place] woman status post right breast biopsy  01/28/2014 for a clinical T1b N0, stage IA invasive ductal carcinoma, grade 1, estrogen and progesterone receptor positive, HER-2 not amplified, with an MIB-1 of 26%  (1) Biopsy of 2 additional suspicious areas in the  right breast 02/03/2014 and 02/10/2014 showed only atypical ductal hyperplasia  (2) status post right lumpectomy and sentinel lymph node sampling 03/01/2014 showed an additional 3 mm area of invasive ductal carcinoma, grade 1, with negative margins. The single sentinel lymph node was negative.   PLAN: We discussed Nancy's results in detail today. Her final pathology showed only a 3 mm area of invasive disease but I don't know is the size of the invasive disease on her original biopsy. I have asked pathology to go back and measure about 4 [place] so we can tell if the final stage is T1aor T1b.  Either way, however, her prognosis is very good. She certainly will not need chemotherapy. 4 the smaller tumors, the use of anti-estrogens is to be considered as per NCCN guidelines (that is, I do not necessarily recommend). In Nancy's case I think she would benefit from antiestrogen is because not only would best reduce her already low risk of this cancer coming back, but it would also cut in half her risk of a new breast cancer developing in either breast. Furthermore we would feel much more comfortable with her foregoing radiation if she was on an antiestrogen.  We then discussed the difference between tamoxifen and anastrozole. She is a good understanding of the possible toxicities, side effects and complications of these agents as well as the possible benefits and their different mechanisms of action. After much discussion we thought we would start with anastrozole and see how she tolerates it.  Before starting on anastrozole though she will meet with Dr. Isidore Moos to make sure radiation oncology is comfortable with the patient's not receiving radiation in this setting. Recall that the patient has a questionable history of scleroderma. If Dr. Lanell Persons agrees that the patient may forego radiation, then Melody Casey will start the anastrozole and return to see me in about 3 months.  If we find that she tolerates anastrozole  well we will obtain a bone density and institute osteoporosis prevention measures possibly including bisphosphonates.  Melody Casey has a good understanding of the overall plan. She agrees with that. She knows a goal of treatment in her case is cure. She will call with any problems that may develop before her next visit here.     Chauncey Cruel, MD   03/12/2014 11:47 AM

## 2014-03-16 DIAGNOSIS — I4891 Unspecified atrial fibrillation: Secondary | ICD-10-CM | POA: Diagnosis not present

## 2014-03-16 DIAGNOSIS — C50919 Malignant neoplasm of unspecified site of unspecified female breast: Secondary | ICD-10-CM | POA: Diagnosis not present

## 2014-03-16 DIAGNOSIS — Z5181 Encounter for therapeutic drug level monitoring: Secondary | ICD-10-CM | POA: Diagnosis not present

## 2014-03-16 DIAGNOSIS — K5733 Diverticulitis of large intestine without perforation or abscess with bleeding: Secondary | ICD-10-CM | POA: Diagnosis not present

## 2014-03-16 DIAGNOSIS — Z7901 Long term (current) use of anticoagulants: Secondary | ICD-10-CM | POA: Diagnosis not present

## 2014-03-19 ENCOUNTER — Ambulatory Visit (INDEPENDENT_AMBULATORY_CARE_PROVIDER_SITE_OTHER): Payer: Medicare Other | Admitting: General Surgery

## 2014-03-19 ENCOUNTER — Encounter (INDEPENDENT_AMBULATORY_CARE_PROVIDER_SITE_OTHER): Payer: Self-pay | Admitting: General Surgery

## 2014-03-19 VITALS — BP 146/82 | HR 76 | Ht 68.0 in | Wt 181.4 lb

## 2014-03-19 DIAGNOSIS — C50411 Malignant neoplasm of upper-outer quadrant of right female breast: Secondary | ICD-10-CM

## 2014-03-19 DIAGNOSIS — C50419 Malignant neoplasm of upper-outer quadrant of unspecified female breast: Secondary | ICD-10-CM

## 2014-03-19 NOTE — Progress Notes (Signed)
Chief complaint: Followup lumpectomy and sentinel lymph node biopsy for breast cancer  History: Patient returns to the office for her first postoperative visit following right breast lumpectomy and axillary sentinel lymph node biopsy for invasive ductal carcinoma. Her pathology revealed an invasive tumor size of only 3 mm with a 7 mm margin for invasive disease and 2 mm margin for DCIS. This was a grade 1 tumor without lymphovascular invasion and previously noted to the ER/PR positive. One sentinel lymph node was negative. She reports no particular problems from the surgery. She has seen medical oncology and likely will be treated with hormonal therapy alone. Radiation oncology evaluation is pending.  Exam: BP 146/82  Pulse 76  Ht 5\' 8"  (1.727 m)  Wt 181 lb 6.4 oz (82.283 kg)  BMI 27.59 kg/m2 General: Appears well Breasts: Right breast incision healing well with some faint central erythema to the right breast. Axillary incision negative.  Physical plan: Doing well following lumpectomy and negative sentinel lymph node biopsy for T1 A. N0 cancer of the right breast. Likely for hormonal therapy alone. Return in 6 months for long-term followup.

## 2014-03-23 NOTE — Progress Notes (Signed)
Location of Breast Cancer:Right Breast, Lower Inner Quadrant  Histology per Pathology Report:   03/01/14 1. Breast, lumpectomy, Right - INVASIVE DUCTAL CARCINOMA, SEE COMMENT. - NEGATIVE FOR LYMPH VASCULAR INVASION. - INVASIVE TUMOR IS 3 MM FROM NEAREST MARGIN (MEDIAL). - DUCTAL CARCINOMA IN SITU WITH CALCIFICATION. - PREVIOUS BIOPSY SITE. - SEE TUMOR SYNOPTIC TEMPLATE BELOW. 2. Breast, lumpectomy, Right mass - BENIGN BREAST TISSUE WITH HEMORRHAGIC PREVIOUS BIOPSY CAVITY, SEE COMMENT 3. - NEGATIVE FOR ATYPIA OR MALIGNANCY. - CALCIFICATIONS IDENTIFIED. 4.. Lymph node, sentinel, biopsy, Right axillary - ONE LYMPH NODE, NEGATIVE FOR TUMOR (0/1).  02/10/13 Breast, right, needle core biopsy, LIQ - ATYPICAL DUCTAL HYPERPLASIA. - FIBROCYSTIC CHANGES WITH ADENOSIS AND CALCIFICATIONS  01/28/14 Breast, right, needle core biopsy - INVASIVE DUCTAL CARCINOMA. - DUCTAL CARCINOMA IN SITU WITH CALCIFICATIONS  Receptor Status: ER(100%R (63%)er2-neu (No amplification), Ki-67(26%)  She recently presented for a screening mamogram revealing a new small cluster of heterogeneous calcifications in the right breast at the 12:00 position suspicious for malignancy.. Subsequent imaging included diagnostic mamogram showing confirmation of these findings.    Past/Anticipated interventions by surgeon, if KNL:[id number] [place] - Lumpectomy right Breast Past/Anticipated Interventions by Medical Oncology: Dr. Prince Rome - ? Anti-estrogen Therapy  Lymphedema issues, if any: None   Pain issues, if any: No  SAFETY ISSUES:  Prior radiation? NO  Pacemaker/ICD? NO   Possible current pregnancy?NO  Is the patient on methotrexate? NO  Current Complaints / other details:  Former smoker - 2 ppd x 50years, quit 2012, No alcohol, No drugs                                                           Menarche age 58, first live birth age 70. The patient stopped having periods approximately 1997. She did not take  hormone replacement.                                                            ADVANCED DIRECTIVES: in place

## 2014-03-23 NOTE — Telephone Encounter (Signed)
error 

## 2014-03-24 ENCOUNTER — Encounter: Payer: Self-pay | Admitting: Radiation Oncology

## 2014-03-24 ENCOUNTER — Ambulatory Visit
Admission: RE | Admit: 2014-03-24 | Discharge: 2014-03-24 | Disposition: A | Discharge status: Home / Self Care | Payer: Medicare Other | Source: Ambulatory Visit | Attending: Radiation Oncology | Admitting: Radiation Oncology

## 2014-03-24 VITALS — BP 151/67 | HR 58 | Temp 98.5°F | Ht 68.0 in | Wt 179.0 lb

## 2014-03-24 DIAGNOSIS — C50411 Malignant neoplasm of upper-outer quadrant of right female breast: Secondary | ICD-10-CM

## 2014-03-24 DIAGNOSIS — Z7982 Long term (current) use of aspirin: Secondary | ICD-10-CM | POA: Diagnosis not present

## 2014-03-24 DIAGNOSIS — Z170 Estrogen receptor positive status [ER+]: Secondary | ICD-10-CM | POA: Insufficient documentation

## 2014-03-24 DIAGNOSIS — C50919 Malignant neoplasm of unspecified site of unspecified female breast: Secondary | ICD-10-CM | POA: Insufficient documentation

## 2014-03-24 DIAGNOSIS — Z79899 Other long term (current) drug therapy: Secondary | ICD-10-CM | POA: Diagnosis not present

## 2014-03-24 DIAGNOSIS — C50419 Malignant neoplasm of upper-outer quadrant of unspecified female breast: Secondary | ICD-10-CM | POA: Diagnosis not present

## 2014-03-24 NOTE — Addendum Note (Signed)
Encounter addended by: Deirdre Evener, RN on: 03/24/2014  4:36 PM<BR>     Documentation filed: Charges VN, Visit Diagnoses

## 2014-03-24 NOTE — Addendum Note (Signed)
Encounter addended by: Deirdre Evener, RN on: 03/24/2014 11:54 AM<BR>     Documentation filed: Arn Medal VN

## 2014-03-24 NOTE — Progress Notes (Signed)
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    We have reviewed her path results at tumor board.  Her margins are clear, and her node was negative.  Her stage is as above.  For her ER+ cancer, she plans to take anastrozole.  Postoperatively, she is doing relatively well.   She inquires if she will need a bone density test, and wants to know when she will get her Rx for her anti estrogen pills.                   ALLERGIES:  is allergic to acetaminophen; banana; oxycodone-acetaminophen; penicillins; prednisone; tape; latex; miralax; and sulfa antibiotics.  Meds: Current Outpatient Prescriptions  Medication Sig Dispense Refill  . ALPRAZolam (XANAX) 0.25 MG tablet Take 0.25 mg by mouth daily as needed for anxiety.       Marland Kitchen amLODipine (NORVASC) 2.5 MG tablet Take 1 tablet (2.5 mg total) by mouth daily.  90 tablet  3  . aspirin 81 MG tablet Take 81 mg by mouth every morning.       Marland Kitchen atorvastatin (LIPITOR) 40 MG tablet TAKE ONE AND ONE-HALF TABLETS BY MOUTH IN THE EVENING  135 tablet  0  . dofetilide (TIKOSYN) 500 MCG capsule Take 1 capsule (500 mcg total) by mouth 2 (two) times daily.  60 capsule  11  . ibuprofen (ADVIL,MOTRIN) 200 MG tablet Take 200-400 mg by mouth daily as needed. For pain      . lisinopril (PRINIVIL,ZESTRIL) 40 MG tablet Take 40 mg by mouth every morning.       . metFORMIN (GLUMETZA) 500 MG (MOD) 24 hr tablet Take 500 mg by mouth every evening.       . niacin (NIASPAN) 500 MG CR tablet Take 500 mg by mouth at bedtime.       . nitroGLYCERIN (NITROSTAT) 0.4 MG SL tablet Place 0.4 mg under the tongue every 5 (five) minutes as needed.        . warfarin (COUMADIN) 5  MG tablet Take 5-7.5 mg by mouth every evening. Take 7.5 mg on wednesdays only.  Take 5 mg all other days      . potassium chloride SA (K-DUR,KLOR-CON) 20 MEQ tablet Take 20 mEq by mouth 2 (two) times daily.        . traMADol (ULTRAM) 50 MG tablet Take 1-2 tablets (50-100 mg total) by mouth every 6 (six) hours as needed.  30 tablet  0   No current facility-administered medications for this encounter.    Physical Findings: The patient is in no acute distress. Patient is alert and oriented.  vitals were not taken for this visit..   Well appearing.  HEENT: dentures Extremities: no arm edema Further exam deferred.  Lab Findings: Lab Results  Component Value Date   WBC 4.7 02/17/2014   HGB 11.8 02/17/2014   HCT 36.6 02/17/2014   MCV 80.2 02/17/2014   PLT 187 02/17/2014    PATHOLOGY:  1. Breast, lumpectomy, Right - INVASIVE DUCTAL CARCINOMA, SEE COMMENT. - NEGATIVE FOR LYMPH VASCULAR INVASION. - INVASIVE TUMOR IS 3 MM FROM NEAREST MARGIN (MEDIAL). - DUCTAL CARCINOMA IN SITU WITH CALCIFICATION. - PREVIOUS BIOPSY  SITE. - SEE TUMOR SYNOPTIC TEMPLATE BELOW. 2. Breast, lumpectomy, Right mass - BENIGN BREAST TISSUE WITH HEMORRHAGIC PREVIOUS BIOPSY CAVITY, SEE COMMENT. 1 of 4 FINAL for Melody Casey ([id number]) Diagnosis(continued) - NEGATIVE FOR ATYPIA OR MALIGNANCY. - CALCIFICATIONS IDENTIFIED. 3. Lymph node, sentinel, biopsy, Right axillary - ONE LYMPH NODE, NEGATIVE FOR TUMOR (0/1). Microscopic Comment 1. BREAST, INVASIVE TUMOR, WITH LYMPH NODE SAMPLING Specimen, including laterality and lymph node sampling (sentinel, non-sentinel): Right breast with sentinel lymph node sampling. Procedure: Lumpectomy. Histologic type: Ductal Grade: I of III Tubule formation: 2 Nuclear pleomorphism: 2 Mitotic:1 Tumor size (glass slide measurement): 3 mm Margins: Invasive, distance to closest margin: 7 mm In-situ, distance to closest margin: 2 mm (medial) If margin positive, focally or  broadly: N/A Lymphovascular invasion: Absent. Ductal carcinoma in situ: Present Grade: I of III Extensive intraductal component: Absent. Lobular neoplasia: Absent. Tumor focality: Unifocal Treatment effect: None If present, treatment effect in breast tissue, lymph nodes or both: N/A Extent of tumor: Skin: N/A Nipple: N/A Skeletal muscle: N/A Lymph nodes: Examined: 1 Sentinel 0 Non-sentinel 1 Total Lymph nodes with metastasis: 0 Isolated tumor cells (< 0.2 mm): N/A Micrometastasis: (> 0.2 mm and < 2.0 mm): N/A Macrometastasis: (> 2.0 mm): N/A Extracapsular extension: N/A Breast prognostic profile: Repeated on invasive tumor (see previous biopsy for DCIS prognostic profile). Estrogen receptor: Pending and will be reported in an addendum. Progesterone receptor: Pending and will be reported in an addendum. Her 2 neu: Pending and will be reported in an addendum. Ki-67: Pending and will be reported in an addendum. Non-neoplastic breast: Fibrocystic change, sclerosing adenosis and previous biopsy site. There are microcalcifications and benign lobules. TNM: pT1a, pN0, pMX  Radiographic Findings: Nm Sentinel Node Inj-no Rpt (breast)  03/01/2014   CLINICAL DATA: cancer of the right breast   Sulfur colloid was injected intradermally by the nuclear medicine  technologist for breast cancer sentinel node localization.     Impression/Plan:  Postoperatively, she is doing relatively well.   She inquires if she will need a bone density test, and wants to know when she will get her Rx for her anti estrogen pills. I will ask our patient navigators to determine her disposition with Dr Melody Casey.   She has been discussed at our multidisciplinary tumor board.    She is a good candidate for antiestrogen therapy as discussed with medical oncology. The alternative option (but less standard) would be radiotherapy to the breast. The most aggressive option would be to pursue both modalities.  We  discussed these options. She understands adding radiotherapy to the "pill"  would carry only a very small but measurable local control benefit (risk of local recurrence to be lowered from ~9% --> ~2% over a decade).     She would like to pursue anastrozole alone, and decline on RT. I think this is a good pathway of care for her low risk disease.  I will see her back PRN. I wished her the best.  _____   [name], MD

## 2014-03-25 ENCOUNTER — Other Ambulatory Visit: Payer: Self-pay | Admitting: Oncology

## 2014-03-29 ENCOUNTER — Other Ambulatory Visit: Payer: Self-pay | Admitting: *Deleted

## 2014-03-29 ENCOUNTER — Telehealth: Payer: Self-pay | Admitting: *Deleted

## 2014-03-29 DIAGNOSIS — C50919 Malignant neoplasm of unspecified site of unspecified female breast: Secondary | ICD-10-CM

## 2014-03-29 MED ORDER — ANASTROZOLE 1 MG PO TABS: 1.0000 mg | ORAL_TABLET | Freq: Every day | ORAL | Status: DC

## 2014-03-29 NOTE — Telephone Encounter (Signed)
Called Anastrozole into [facility] pharmacy on [address] per Dr. Jana Hakim order.  Informed pt she may pick up today.  Gave instructions.  Encourage pt to call with questions.  Received verbal understanding.

## 2014-04-06 DIAGNOSIS — E1159 Type 2 diabetes mellitus with other circulatory complications: Secondary | ICD-10-CM | POA: Diagnosis not present

## 2014-04-06 DIAGNOSIS — L84 Corns and callosities: Secondary | ICD-10-CM | POA: Diagnosis not present

## 2014-04-06 DIAGNOSIS — I739 Peripheral vascular disease, unspecified: Secondary | ICD-10-CM | POA: Diagnosis not present

## 2014-04-06 DIAGNOSIS — L608 Other nail disorders: Secondary | ICD-10-CM | POA: Diagnosis not present

## 2014-04-13 DIAGNOSIS — K5732 Diverticulitis of large intestine without perforation or abscess without bleeding: Secondary | ICD-10-CM | POA: Diagnosis not present

## 2014-05-23 ENCOUNTER — Other Ambulatory Visit: Payer: Self-pay | Admitting: Cardiology

## 2014-05-26 DIAGNOSIS — I4891 Unspecified atrial fibrillation: Secondary | ICD-10-CM | POA: Diagnosis not present

## 2014-06-04 ENCOUNTER — Other Ambulatory Visit: Payer: Self-pay | Admitting: Gastroenterology

## 2014-06-10 ENCOUNTER — Encounter (HOSPITAL_COMMUNITY): Payer: Self-pay | Admitting: Pharmacy Technician

## 2014-06-16 ENCOUNTER — Other Ambulatory Visit: Payer: Self-pay | Admitting: *Deleted

## 2014-06-16 DIAGNOSIS — C50411 Malignant neoplasm of upper-outer quadrant of right female breast: Secondary | ICD-10-CM

## 2014-06-17 ENCOUNTER — Other Ambulatory Visit (HOSPITAL_BASED_OUTPATIENT_CLINIC_OR_DEPARTMENT_OTHER): Payer: Medicare Other

## 2014-06-17 DIAGNOSIS — C50411 Malignant neoplasm of upper-outer quadrant of right female breast: Secondary | ICD-10-CM

## 2014-06-17 DIAGNOSIS — C50419 Malignant neoplasm of upper-outer quadrant of unspecified female breast: Secondary | ICD-10-CM

## 2014-06-17 LAB — CBC WITH DIFFERENTIAL/PLATELET
BASO%: 1 % (ref 0.0–2.0)
Basophils Absolute: 0 10*3/uL (ref 0.0–0.1)
EOS%: 1.3 % (ref 0.0–7.0)
Eosinophils Absolute: 0.1 10*3/uL (ref 0.0–0.5)
HCT: 36.3 % (ref 34.8–46.6)
HGB: 11.8 g/dL (ref 11.6–15.9)
LYMPH#: 1.4 10*3/uL (ref 0.9–3.3)
LYMPH%: 34.6 % (ref 14.0–49.7)
MCH: 27.3 pg (ref 25.1–34.0)
MCHC: 32.7 g/dL (ref 31.5–36.0)
MCV: 83.5 fL (ref 79.5–101.0)
MONO#: 0.4 10*3/uL (ref 0.1–0.9)
MONO%: 8.5 % (ref 0.0–14.0)
NEUT#: 2.3 10*3/uL (ref 1.5–6.5)
NEUT%: 54.6 % (ref 38.4–76.8)
Platelets: 200 10*3/uL (ref 145–400)
RBC: 4.34 10*6/uL (ref 3.70–5.45)
RDW: 16.6 % — ABNORMAL HIGH (ref 11.2–14.5)
WBC: 4.2 10*3/uL (ref 3.9–10.3)

## 2014-06-17 LAB — COMPREHENSIVE METABOLIC PANEL (CC13)
ALBUMIN: 3.5 g/dL (ref 3.5–5.0)
ALT: 63 U/L — ABNORMAL HIGH (ref 0–55)
ANION GAP: 9 meq/L (ref 3–11)
AST: 43 U/L — AB (ref 5–34)
Alkaline Phosphatase: 104 U/L (ref 40–150)
BUN: 16.9 mg/dL (ref 7.0–26.0)
CALCIUM: 9.8 mg/dL (ref 8.4–10.4)
CHLORIDE: 105 meq/L (ref 98–109)
CO2: 24 meq/L (ref 22–29)
CREATININE: 0.8 mg/dL (ref 0.6–1.1)
GLUCOSE: 143 mg/dL — AB (ref 70–140)
POTASSIUM: 3.9 meq/L (ref 3.5–5.1)
Sodium: 138 mEq/L (ref 136–145)
Total Bilirubin: 0.49 mg/dL (ref 0.20–1.20)
Total Protein: 8 g/dL (ref 6.4–8.3)

## 2014-06-18 ENCOUNTER — Encounter (HOSPITAL_COMMUNITY): Payer: Self-pay | Admitting: *Deleted

## 2014-06-22 ENCOUNTER — Ambulatory Visit (HOSPITAL_BASED_OUTPATIENT_CLINIC_OR_DEPARTMENT_OTHER): Payer: Medicare Other | Admitting: Oncology

## 2014-06-22 VITALS — BP 142/63 | HR 63 | Temp 98.3°F | Resp 20 | Ht 68.0 in | Wt 180.4 lb

## 2014-06-22 DIAGNOSIS — C50411 Malignant neoplasm of upper-outer quadrant of right female breast: Secondary | ICD-10-CM

## 2014-06-22 DIAGNOSIS — C50419 Malignant neoplasm of upper-outer quadrant of unspecified female breast: Secondary | ICD-10-CM | POA: Diagnosis not present

## 2014-06-22 DIAGNOSIS — Z170 Estrogen receptor positive status [ER+]: Secondary | ICD-10-CM

## 2014-06-22 DIAGNOSIS — M8580 Other specified disorders of bone density and structure, unspecified site: Secondary | ICD-10-CM

## 2014-06-22 NOTE — Progress Notes (Signed)
[place]  [phone] [phone]) [phone]     ID: Melody Casey OB: 1946/01/24  MR#: [id number]  ANV#:[id number]  PCP: Melody Ou, MD GYN:   SU: Melody Casey OTHER MD: Melody Casey, Melody Casey, Melody Casey, Melody Casey,  CHIEF COMPLAINT: Estrogen receptor positive breast cancer CURRENT TREATMENT: Antiestrogen therapy  BREAST CANCER HISTORY: From the original intake note:   "Melody Casey" had routine breast mammography at [facility] 01/14/2014. The breast density was category B. A new cluster of heterogeneous calcifications were noted in the right breast and on 01/28/2014 the patient underwent stereotactic biopsy of this area with the pathology ([id number]) showing an invasive ductal carcinoma, grade 1, estrogen receptor 100% positive, with strong staining intensity; progesterone receptor 63% positive, with moderate staining intensity, with an MIB-1 of 26% and no HER-2 amplification, the ratio being 1.44 and the number per cell 2.80.  On 02/01/2014 the patient underwent bilateral MRI of the breast, with no enhancement associated with the previously biopsied area. However there was a separate 7 mm enhancing mass in the lower inner quadrant and biopsy of this mass 02/03/2014 showed benign breast parenchyma, with no atypia. Review of this pathology showed atypical ductal hyperplasia, and therefore excisional biopsy was recommended, performed 02/10/2014 and showing only atypical ductal hyperplasia, with adenosis and calcifications ([id number]).  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 Tumor.   Her subsequent history is as detailed below  INTERVAL HISTORY: Melody Casey returns today for followup of her breast cancer. Since her last visit here she met with radiation oncology and decided against adjuvant  radiation. This is discussed thoroughly and Dr. Lanell Casey note. Melody Casey then started anastrozole mid April. She is tolerating it well. She does have some hot flashes, which rarely wake her up. Vaginal dryness is "not a problem", and she has not developed any arthralgias or myalgias associated with this. Cost is also "very reasonable".  REVIEW OF SYSTEMS: She has significant left knee problems. These have been present for several years. She is dreading the thought that she may need to have a knee replacement. She also has significant diverticular disease and is scheduled for a colonoscopy July 21. Because she has been using nonsteroidals for the knee pain she is very concerned and his thinking of canceling the colonoscopy. We discussed her using tramadol instead. She actually has that available. Aside from these issues a detailed review of systems today was stable  PAST MEDICAL HISTORY: Past Medical History  Diagnosis Date  . HLD (hyperlipidemia)   . HTN (hypertension)   . Arthritis   . Foot deformity     right  . CAD (coronary artery disease)     a. NSTEMI 02/2011: occ mid Cx, DES to OM2, residual nonobst LAD dz.  . Tobacco abuse     stopped smoking 2012  . Diabetes mellitus   . Atrial fibrillation     a. Dx 03/2011 - on tikosyn/coumadin.  . Anxiety   . Diverticulitis of colon 2008 and 04/2011  . Ureteral obstruction     History of gross hematuria/right hydronephrosis 2/2 to uteropelvic junction obstruction, s/p cystoscopy in January 2007 with bilateral retrograde pyelography, right ureter arthroscopy, right ureteral stent placement, bladder biopsies, stent removal since then.  . Heart attack   . Wears dentures     top  . Dysrhythmia  a-fib    PAST SURGICAL HISTORY: Past Surgical History  Procedure Laterality Date  . Tonsillectomy and adenoidectomy    . Knee arthroscopy      left  . Tubal ligation    . Right leg surgery  age 19    As a child for  ?scleroderma per patient  .  Ureteral surgery  2011    rt ureterostomy-stent  . Coronary stent placement  2012  . Cardiac catheterization  2012    stent  . Breast lumpectomy with needle localization and axillary sentinel lymph node bx Right 03/01/2014    Procedure: BREAST LUMPECTOMY WITH NEEDLE LOCALIZATION AND AXILLARY SENTINEL LYMPH NODE BX;  Surgeon: Melody Jolly, MD;  Location: [place];  Service: General;  Laterality: Right;    FAMILY HISTORY Family History  Problem Relation Age of Onset  . Colon cancer Neg Hx   . Liver disease Neg Hx   . Lung cancer Father 39    deceased  . Heart failure Mother 70    deceased  The patient's father died at the age of 48 with a history of lung cancer. He was a smoker and also had asbestos exposure. The patient's mother died at the age of 7. The patient had one brother. There is no other history of cancer in the family to her knowledge  GYNECOLOGIC HISTORY:  Menarche age 66, first live birth age 44. The patient stopped having periods approximately 1997. She did not take hormone replacement.   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 Her husband of 82 years, Melody Casey is a retired Futures trader. He has a history of [facility]. Daughter Melody Casey lives in [name] and is a Scientist, clinical (histocompatibility and immunogenetics). Son Melody Casey lives in [name] and he is an Conservator, museum/gallery. The patient has 7 grandchildren and 2 great-grandchildren. She attends [employer]     ADVANCED DIRECTIVES:  in place    HEALTH MAINTENANCE: History  Substance Use Topics  . Smoking status: Former Smoker -- 2.00 packs/day for 50 years    Types: Cigarettes    Quit date: 02/08/2011  . Smokeless tobacco: Not on file  . Alcohol Use: No     Colonoscopy: never   PAP: 2013  Bone density:Never  Lipid panel:  Allergies  Allergen Reactions  .  Acetaminophen Hives  . Banana Other (See Comments)    Unknown  . Oxycodone-Acetaminophen Itching  . Penicillins Other (See Comments)    Pt had so much as a child she's immune to it  . Prednisone Itching  . Latex Rash  . Miralax [Polyethylene Glycol] Swelling and Rash    Took [facility] brand developed rash. Patient states she tolerated name brand MiraLax in the past.  . Sulfa Antibiotics Rash  . Tape Rash    Current Outpatient Prescriptions  Medication Sig Dispense Refill  . ALPRAZolam (XANAX) 0.25 MG tablet Take 0.25 mg by mouth daily as needed for anxiety.       Marland Kitchen amLODipine (NORVASC) 2.5 MG tablet Take 2.5 mg by mouth every morning.      Marland Kitchen anastrozole (ARIMIDEX) 1 MG tablet Take 1 mg by mouth every morning.      Marland Kitchen aspirin EC 81 MG tablet Take 81 mg by mouth daily.      Marland Kitchen atorvastatin (LIPITOR) 40 MG tablet Take 60 mg by mouth daily.      Marland Kitchen  dofetilide (TIKOSYN) 500 MCG capsule Take 500 mcg by mouth 2 (two) times daily.      Marland Kitchen ibuprofen (ADVIL,MOTRIN) 200 MG tablet Take 200-400 mg by mouth daily as needed. For pain      . lisinopril (PRINIVIL,ZESTRIL) 40 MG tablet Take 40 mg by mouth every morning.       . metFORMIN (GLUMETZA) 500 MG (MOD) 24 hr tablet Take 500 mg by mouth at bedtime.       . niacin (NIASPAN) 500 MG CR tablet Take 500 mg by mouth at bedtime.       . nitroGLYCERIN (NITROSTAT) 0.4 MG SL tablet Place 0.4 mg under the tongue every 5 (five) minutes as needed for chest pain.       . potassium chloride SA (K-DUR,KLOR-CON) 20 MEQ tablet Take 20 mEq by mouth 2 (two) times daily.        Marland Kitchen warfarin (COUMADIN) 5 MG tablet Take 5-7.5 mg by mouth daily. 7.9m on Wednesdays and 5 mg on all other days       No current facility-administered medications for this visit.    OBJECTIVE: Older white woman who appears stated age F54Vitals:   06/22/14 1604  BP: 142/63  Pulse: 63  Temp: 98.3 F (36.8 C)  Resp: 20     Body mass index is 27.44 kg/(m^2).    ECOG FS:1 - Symptomatic but  completely ambulatory  Ocular: Sclerae unicteric, pupils are round and equal Ear-nose-throat: Oropharynx clear and moist  Lymphatic: No cervical or supraclavicular adenopathy Lungs no rales or rhonchi Heart regular rate and rhythm today Abd soft, nontender, positive bowel sounds MSK no focal spinal tenderness, no upper extremity edema Neuro: non-focal, well-oriented, appropriate affect Breasts:  The right breast is status post lumpectomy. There is no evidence of residual or recurrent disease. The right axilla is benign. The left breast is unremarkable   LAB RESULTS:  CMP     Component Value Date/Time   NA 138 06/17/2014 1531   NA 138 10/24/2012 1450   K 3.9 06/17/2014 1531   K 4.6 10/24/2012 1450   CL 104 10/24/2012 1450   CO2 24 06/17/2014 1531   CO2 26 10/24/2012 1450   GLUCOSE 143* 06/17/2014 1531   GLUCOSE 110* 10/24/2012 1450   BUN 16.9 06/17/2014 1531   BUN 15 10/24/2012 1450   CREATININE 0.8 06/17/2014 1531   CREATININE 0.69 10/24/2012 1450   CREATININE 0.57 05/23/2011 1300   CALCIUM 9.8 06/17/2014 1531   CALCIUM 9.5 10/24/2012 1450   PROT 8.0 06/17/2014 1531   PROT 7.5 10/24/2012 1450   ALBUMIN 3.5 06/17/2014 1531   ALBUMIN 3.5 10/24/2012 1450   AST 43* 06/17/2014 1531   AST 21 10/24/2012 1450   ALT 63* 06/17/2014 1531   ALT 18 10/24/2012 1450   ALKPHOS 104 06/17/2014 1531   ALKPHOS 125* 10/24/2012 1450   BILITOT 0.49 06/17/2014 1531   BILITOT 0.4 10/24/2012 1450   GFRNONAA 87* 10/24/2012 1450   GFRAA >90 10/24/2012 1450    I No results found for this basename: SPEP,  UPEP,   kappa and lambda light chains    Lab Results  Component Value Date   WBC 4.2 06/17/2014   NEUTROABS 2.3 06/17/2014   HGB 11.8 06/17/2014   HCT 36.3 06/17/2014   MCV 83.5 06/17/2014   PLT 200 06/17/2014      Chemistry      Component Value Date/Time   NA 138 06/17/2014 1531   NA 138 10/24/2012 1450  K 3.9 06/17/2014 1531   K 4.6 10/24/2012 1450   CL 104 10/24/2012 1450   CO2 24 06/17/2014 1531   CO2 26  10/24/2012 1450   BUN 16.9 06/17/2014 1531   BUN 15 10/24/2012 1450   CREATININE 0.8 06/17/2014 1531   CREATININE 0.69 10/24/2012 1450   CREATININE 0.57 05/23/2011 1300      Component Value Date/Time   CALCIUM 9.8 06/17/2014 1531   CALCIUM 9.5 10/24/2012 1450   ALKPHOS 104 06/17/2014 1531   ALKPHOS 125* 10/24/2012 1450   AST 43* 06/17/2014 1531   AST 21 10/24/2012 1450   ALT 63* 06/17/2014 1531   ALT 18 10/24/2012 1450   BILITOT 0.49 06/17/2014 1531   BILITOT 0.4 10/24/2012 1450       No results found for this basename: LABCA2    No components found with this basename: LABCA125    No results found for this basename: INR,  in the last 168 hours  Urinalysis    Component Value Date/Time   COLORURINE YELLOW 10/24/2012 [place] 10/24/2012 1750   LABSPEC 1.015 10/24/2012 1750   PHURINE 6.5 10/24/2012 1750   GLUCOSEU NEGATIVE 10/24/2012 1750   GLUCOSEU NEGATIVE 04/05/2008 0833   HGBUR NEGATIVE 10/24/2012 1750   BILIRUBINUR NEGATIVE 10/24/2012 1750   KETONESUR NEGATIVE 10/24/2012 1750   PROTEINUR NEGATIVE 10/24/2012 1750   UROBILINOGEN 0.2 10/24/2012 1750   NITRITE NEGATIVE 10/24/2012 1750   LEUKOCYTESUR LARGE* 10/24/2012 1750    STUDIES: No results found.  ASSESSMENT: 68 y.o. [place] woman status post right breast biopsy  01/28/2014 for a clinical T1b N0, stage IA invasive ductal carcinoma, grade 1, estrogen and progesterone receptor positive, HER-2 not amplified, with an MIB-1 of 26%  (1) Biopsy of 2 additional suspicious areas in the right breast 02/03/2014 and 02/10/2014 showed only atypical ductal hyperplasia  (2) status post right lumpectomy and sentinel lymph node sampling 03/01/2014 showed an additional 3 mm area of invasive ductal carcinoma, grade 1, with negative margins. The single sentinel lymph node was negative.  (3) Opted against adjuvant radiation given the marginal benefit of local recurrence  (4) started anastrozole 03/29/2049  PLAN: Kierria  is tolerating the anastrozole well, and the plan is going to be to continue that for 5 years. She has never had a bone density and we will arrange for her to have that with her next mammogram at [facility] which will be February of next year.  I encouraged her to go ahead with her colonoscopy next week. She is concerned because she has significant knee pain and will be able to take nonsteroidals. She does have tramadol and I think she can probably get enough relief from that that she can get to that procedure.  As far as her knee is concerned she will be calling Dr. Theda Sers. She understands there are many alternatives to knee replacement and that some of the more palliative procedures can be effective at least temporarily.  As far as breast cancer is concerned I am going to see her on a once a year basis after her yearly mammogram.  she has never had a bone density and we will arrange for her to have that with her next mammogram at [facility].   Melody Casey has a good understanding of the overall plan. She agrees with it. She knows the goal of treatment into cases cure. She  will call for any problems that may develop before her next visit here.   Chauncey Cruel, MD  06/22/2014 4:15 PM

## 2014-06-23 DIAGNOSIS — L84 Corns and callosities: Secondary | ICD-10-CM | POA: Diagnosis not present

## 2014-06-23 DIAGNOSIS — L608 Other nail disorders: Secondary | ICD-10-CM | POA: Diagnosis not present

## 2014-06-23 DIAGNOSIS — E1159 Type 2 diabetes mellitus with other circulatory complications: Secondary | ICD-10-CM | POA: Diagnosis not present

## 2014-06-23 DIAGNOSIS — I739 Peripheral vascular disease, unspecified: Secondary | ICD-10-CM | POA: Diagnosis not present

## 2014-06-23 NOTE — Addendum Note (Signed)
Addended by: Amelia Jo I on: 06/23/2014 12:39 PM   Modules accepted: Medications

## 2014-06-29 ENCOUNTER — Encounter (HOSPITAL_COMMUNITY): Payer: Medicare Other | Admitting: Registered Nurse

## 2014-06-29 ENCOUNTER — Encounter (HOSPITAL_COMMUNITY)
Admission: RE | Disposition: A | Discharge status: Home / Self Care | Payer: Self-pay | Source: Ambulatory Visit | Attending: Gastroenterology

## 2014-06-29 ENCOUNTER — Encounter (HOSPITAL_COMMUNITY): Payer: Self-pay | Admitting: Registered Nurse

## 2014-06-29 ENCOUNTER — Ambulatory Visit (HOSPITAL_COMMUNITY): Payer: Medicare Other | Admitting: Registered Nurse

## 2014-06-29 ENCOUNTER — Ambulatory Visit (HOSPITAL_COMMUNITY)
Admission: RE | Admit: 2014-06-29 | Discharge: 2014-06-29 | Disposition: A | Discharge status: Home / Self Care | Payer: Medicare Other | Source: Ambulatory Visit | Attending: Gastroenterology | Admitting: Gastroenterology

## 2014-06-29 DIAGNOSIS — I252 Old myocardial infarction: Secondary | ICD-10-CM | POA: Insufficient documentation

## 2014-06-29 DIAGNOSIS — Z853 Personal history of malignant neoplasm of breast: Secondary | ICD-10-CM | POA: Insufficient documentation

## 2014-06-29 DIAGNOSIS — I10 Essential (primary) hypertension: Secondary | ICD-10-CM | POA: Insufficient documentation

## 2014-06-29 DIAGNOSIS — I4891 Unspecified atrial fibrillation: Secondary | ICD-10-CM | POA: Insufficient documentation

## 2014-06-29 DIAGNOSIS — M1990 Unspecified osteoarthritis, unspecified site: Secondary | ICD-10-CM | POA: Diagnosis not present

## 2014-06-29 DIAGNOSIS — F329 Major depressive disorder, single episode, unspecified: Secondary | ICD-10-CM | POA: Insufficient documentation

## 2014-06-29 DIAGNOSIS — Z7982 Long term (current) use of aspirin: Secondary | ICD-10-CM | POA: Diagnosis not present

## 2014-06-29 DIAGNOSIS — Z87891 Personal history of nicotine dependence: Secondary | ICD-10-CM | POA: Insufficient documentation

## 2014-06-29 DIAGNOSIS — I2510 Atherosclerotic heart disease of native coronary artery without angina pectoris: Secondary | ICD-10-CM | POA: Insufficient documentation

## 2014-06-29 DIAGNOSIS — K5732 Diverticulitis of large intestine without perforation or abscess without bleeding: Secondary | ICD-10-CM | POA: Diagnosis not present

## 2014-06-29 DIAGNOSIS — K5730 Diverticulosis of large intestine without perforation or abscess without bleeding: Secondary | ICD-10-CM | POA: Diagnosis not present

## 2014-06-29 DIAGNOSIS — Z1211 Encounter for screening for malignant neoplasm of colon: Secondary | ICD-10-CM | POA: Insufficient documentation

## 2014-06-29 DIAGNOSIS — F411 Generalized anxiety disorder: Secondary | ICD-10-CM | POA: Diagnosis not present

## 2014-06-29 DIAGNOSIS — Z7901 Long term (current) use of anticoagulants: Secondary | ICD-10-CM | POA: Diagnosis not present

## 2014-06-29 DIAGNOSIS — E119 Type 2 diabetes mellitus without complications: Secondary | ICD-10-CM | POA: Insufficient documentation

## 2014-06-29 DIAGNOSIS — F3289 Other specified depressive episodes: Secondary | ICD-10-CM | POA: Diagnosis not present

## 2014-06-29 DIAGNOSIS — Z9861 Coronary angioplasty status: Secondary | ICD-10-CM | POA: Diagnosis not present

## 2014-06-29 DIAGNOSIS — Z79899 Other long term (current) drug therapy: Secondary | ICD-10-CM | POA: Diagnosis not present

## 2014-06-29 HISTORY — PX: COLONOSCOPY WITH PROPOFOL: SHX5780

## 2014-06-29 HISTORY — DX: Cardiac arrhythmia, unspecified: I49.9

## 2014-06-29 LAB — PROTIME-INR
INR: 1.04 (ref 0.00–1.49)
Prothrombin Time: 13.6 seconds (ref 11.6–15.2)

## 2014-06-29 LAB — GLUCOSE, CAPILLARY: Glucose-Capillary: 105 mg/dL — ABNORMAL HIGH (ref 70–99)

## 2014-06-29 SURGERY — COLONOSCOPY WITH PROPOFOL
Anesthesia: Monitor Anesthesia Care

## 2014-06-29 MED ORDER — LACTATED RINGERS IV SOLN
INTRAVENOUS | Status: DC
  Administered 2014-06-29: 1000 mL via INTRAVENOUS

## 2014-06-29 MED ORDER — SODIUM CHLORIDE 0.9 % IV SOLN
INTRAVENOUS | Status: DC
Start: 2014-06-29 — End: 2014-06-29

## 2014-06-29 MED ORDER — ONDANSETRON HCL 4 MG/2ML IJ SOLN
INTRAMUSCULAR | Status: AC
  Filled 2014-06-29: qty 2

## 2014-06-29 MED ORDER — LACTATED RINGERS IV SOLN
INTRAVENOUS | Status: DC | PRN
  Administered 2014-06-29: 07:00:00 via INTRAVENOUS

## 2014-06-29 MED ORDER — PROPOFOL 10 MG/ML IV BOLUS
INTRAVENOUS | Status: AC
  Filled 2014-06-29: qty 20

## 2014-06-29 MED ORDER — PROPOFOL INFUSION 10 MG/ML OPTIME
INTRAVENOUS | Status: DC | PRN
  Administered 2014-06-29: 200 ug/kg/min via INTRAVENOUS

## 2014-06-29 SURGICAL SUPPLY — 21 items

## 2014-06-29 NOTE — Anesthesia Preprocedure Evaluation (Addendum)
Anesthesia Evaluation  Patient identified by MRN, date of birth, ID band Patient awake    Reviewed: Allergy & Precautions, H&P , NPO status , Patient's Chart, lab work & pertinent test results  Airway Mallampati: II  Neck ROM: full    Dental  (+) Partial Upper, Missing, Dental Advisory Given,    Pulmonary former smoker,  breath sounds clear to auscultation  Pulmonary exam normal       Cardiovascular hypertension, Pt. on medications + CAD, + Past MI and + Cardiac Stents + dysrhythmias Atrial Fibrillation Rhythm:Regular Rate:Normal     Neuro/Psych Anxiety Depression negative neurological ROS     GI/Hepatic negative GI ROS, Neg liver ROS,   Endo/Other  diabetes, Type 2, Oral Hypoglycemic Agents  Renal/GU negative Renal ROS  negative genitourinary   Musculoskeletal negative musculoskeletal ROS (+)   Abdominal   Peds  Hematology negative hematology ROS (+)   Anesthesia Other Findings   Reproductive/Obstetrics                          Anesthesia Physical Anesthesia Plan  ASA: III  Anesthesia Plan: MAC   Post-op Pain Management:    Induction: Intravenous  Airway Management Planned: Simple Face Mask  Additional Equipment:   Intra-op Plan:   Post-operative Plan:   Informed Consent: I have reviewed the patients History and Physical, chart, labs and discussed the procedure including the risks, benefits and alternatives for the proposed anesthesia with the patient or authorized representative who has indicated his/her understanding and acceptance.   Dental advisory given  Plan Discussed with: CRNA  Anesthesia Plan Comments:         Anesthesia Quick Evaluation

## 2014-06-29 NOTE — H&P (Signed)
  Procedure: Screening colonoscopy following treatment for acute diverticulitis.  History: The patient is a 68 year old female born Mar 22, 1946. She is scheduled to undergo screening colonoscopy following treatment of acute diverticulitis.  On 05/02/2011, the patient underwent a CT scan of the abdomen and pelvis to evaluate abdominal pain and fever. 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  On 04/13/2014, the patient developed recurrent left lower quadrant abdominal pain associated with nonbloody diarrhea consistent with an attack of acute diverticulitis. She completed a course of ciprofloxacin and metronidazole.  A few years ago, the patient took 2 doses of MiraLAX and developed acute swelling suggestive of an urticarial reaction.  Chronic medications: Alprazolam. Amlodipine. Aspirin. Atorvastatin. Dofetilide. Ibuprofen. Lisinopril. Metformin. Niacin. Nitroglycerin. Potassium chloride. Tramadol. Coumadin.  Past medical history: Recurrent diverticulitis. Paroxysmal atrial fibrillation. Coronary artery stent placement. Right breast lumpectomy for breast cancer. Hypertension. Type 2 diabetes mellitus. Coronary artery disease.  Medication allergies: Acetaminophen. Oxycodone. Penicillin. Prednisone. Latex. Sulfa. Polyethylene glycol. Adhesive tape. Bananas.  Exam: The patient is alert and lying comfortably on the endoscopy stretcher. Abdomen is soft and nontender to palpation. Lungs are clear to auscultation. Cardiac exam reveals a regular rhythm.  Plan: Proceed with screening colonoscopy following treatment of recurrent acute diverticulitis

## 2014-06-29 NOTE — Transfer of Care (Signed)
Immediate Anesthesia Transfer of Care Note  Patient: Melody Casey  Procedure(s) Performed: Procedure(s): COLONOSCOPY WITH PROPOFOL (N/A)  Patient Location: PACU  Anesthesia Type:MAC  Level of Consciousness: awake, alert , oriented and patient cooperative  Airway & Oxygen Therapy: Patient Spontanous Breathing and Patient connected to face mask oxygen  Post-op Assessment: Report given to PACU RN, Post -op Vital signs reviewed and stable and Patient moving all extremities X 4  Post vital signs: stable  Complications: No apparent anesthesia complications

## 2014-06-29 NOTE — Anesthesia Postprocedure Evaluation (Signed)
Anesthesia Post Note  Patient: Melody Casey  Procedure(s) Performed: Procedure(s) (LRB): COLONOSCOPY WITH PROPOFOL (N/A)  Anesthesia type: MAC  Patient location: PACU  Post pain: Pain level controlled  Post assessment: Post-op Vital signs reviewed  Last Vitals:  Filed Vitals:   06/29/14 0905  BP: 132/60  Pulse: 56  Temp:   Resp: 13    Post vital signs: Reviewed  Level of consciousness: sedated  Complications: No apparent anesthesia complications

## 2014-06-29 NOTE — Op Note (Signed)
Problem: Recurrent acute diverticulitis  Endoscopist: Earle Gell  Premedication: Propofol administered by anesthesia  Procedure: Colonoscopy The patient was placed in the left lateral decubitus position. Anal inspection and digital rectal exam were normal. The Pentax pediatric colonoscope was introduced into the rectum and advanced to the cecum. A normal-appearing appendiceal orifice and ileocecal valve were identified. Colonic preparation for the exam today was good.  The patient has universal colonic diverticulosis without signs of diverticulitis or diverticular stricture formation  Rectum. Normal. I was unable to retroflex the scope in the rectum and visualized the distal rectum and a retroflexed position  Sigmoid colon and descending colon. Normal  Splenic flexure. Normal  Transverse colon. Normal  Hepatic flexure. Normal  Ascending colon. Normal  Cecum and ileocecal valve. Normal  Assessment:  #1. Universal colonic diverticulosis  #2. Otherwise normal colonoscopy

## 2014-06-29 NOTE — Discharge Instructions (Signed)
Colonoscopy, Care After These instructions give you information on caring for yourself after your procedure. Your doctor may also give you more specific instructions. Call your doctor if you have any problems or questions after your procedure. HOME CARE  Do not drive for 24 hours.  Do not sign important papers or use machinery for 24 hours.  You may shower.  You may go back to your usual activities, but go slower for the first 24 hours.  Take rest breaks often during the first 24 hours.  Walk around or use warm packs on your belly (abdomen) if you have belly cramping or gas.  Drink enough fluids to keep your pee (urine) clear or pale yellow.  Resume your normal diet. Avoid heavy or fried foods.  Avoid drinking alcohol for 24 hours or as told by your doctor.  Only take medicines as told by your doctor. If a tissue sample (biopsy) was taken during the procedure:   Do not take aspirin or blood thinners for 7 days, or as told by your doctor.  Do not drink alcohol for 7 days, or as told by your doctor.  Eat soft foods for the first 24 hours. GET HELP IF: You still have a small amount of blood in your poop (stool) 2-3 days after the procedure. GET HELP RIGHT AWAY IF:  You have more than a small amount of blood in your poop.  You see clumps of tissue (blood clots) in your poop.  Your belly is puffy (swollen).  You feel sick to your stomach (nauseous) or throw up (vomit).  You have a fever.  You have belly pain that gets worse and medicine does not help. MAKE SURE YOU:  Understand these instructions.  Will watch your condition.  Will get help right away if you are not doing well or get worse. Document Released: 12/29/2010 Document Revised: 12/01/2013 Document Reviewed: 08/03/2013 [facility] Patient Information 2015 [place], [place]. This information is not intended to replace advice given to you by your health care provider. Make sure you discuss any questions you have with  your health care provider.  Monitored Anesthesia Care  Monitored anesthesia care is an anesthesia service for a medical procedure. Anesthesia is the loss of the ability to feel pain. It is produced by medications called anesthetics. It may affect a small area of your body (local anesthesia), a large area of your body (regional anesthesia), or your entire body (general anesthesia). The need for monitored anesthesia care depends your procedure, your condition, and the potential need for regional or general anesthesia. It is often provided during procedures where:   General anesthesia may be needed if there are complications. This is because you need special care when you are under general anesthesia.   You will be under local or regional anesthesia. This is so that you are able to have higher levels of anesthesia if needed.   You will receive calming medications (sedatives). This is especially the case if sedatives are given to put you in a semi-conscious state of relaxation (deep sedation). This is because the amount of sedative needed to produce this state can be hard to predict. Too much of a sedative can produce general anesthesia. Monitored anesthesia care is performed by one or more caregivers who have special training in all types of anesthesia. You will need to meet with these caregivers before your procedure. During this meeting, they will ask you about your medical history. They will also give you instructions to follow. (For example, you  will need to stop eating and drinking before your procedure. You may also need to stop or change medications you are taking.) During your procedure, your caregivers will stay with you. They will:   Watch your condition. This includes watching you blood pressure, breathing, and level of pain.   Diagnose and treat problems that occur.   Give medications if they are needed. These may include calming medications (sedatives) and anesthetics.   Make sure  you are comfortable.  Having monitored anesthesia care does not necessarily mean that you will be under anesthesia. It does mean that your caregivers will be able to manage anesthesia if you need it or if it occurs. It also means that you will be able to have a different type of anesthesia than you are having if you need it. When your procedure is complete, your caregivers will continue to watch your condition. They will make sure any medications wear off before you are allowed to go home.  Document Released: 08/22/2005 Document Revised: 03/23/2013 Document Reviewed: 01/07/2013 [facility] Patient Information 2015 [place], [place]. This information is not intended to replace advice given to you by your health care provider. Make sure you discuss any questions you have with your health care provider.

## 2014-06-30 ENCOUNTER — Encounter (HOSPITAL_COMMUNITY): Payer: Self-pay | Admitting: Gastroenterology

## 2014-07-21 DIAGNOSIS — I4891 Unspecified atrial fibrillation: Secondary | ICD-10-CM | POA: Diagnosis not present

## 2014-07-21 DIAGNOSIS — Z7901 Long term (current) use of anticoagulants: Secondary | ICD-10-CM | POA: Diagnosis not present

## 2014-07-22 DIAGNOSIS — R350 Frequency of micturition: Secondary | ICD-10-CM | POA: Diagnosis not present

## 2014-07-28 ENCOUNTER — Ambulatory Visit (INDEPENDENT_AMBULATORY_CARE_PROVIDER_SITE_OTHER): Payer: Medicare Other | Admitting: Cardiology

## 2014-07-28 ENCOUNTER — Encounter: Payer: Self-pay | Admitting: Cardiology

## 2014-07-28 VITALS — BP 140/82 | HR 64 | Ht 68.0 in | Wt 174.0 lb

## 2014-07-28 DIAGNOSIS — I4891 Unspecified atrial fibrillation: Secondary | ICD-10-CM | POA: Diagnosis not present

## 2014-07-28 DIAGNOSIS — Z79899 Other long term (current) drug therapy: Secondary | ICD-10-CM

## 2014-07-28 DIAGNOSIS — I10 Essential (primary) hypertension: Secondary | ICD-10-CM | POA: Diagnosis not present

## 2014-07-28 DIAGNOSIS — I2510 Atherosclerotic heart disease of native coronary artery without angina pectoris: Secondary | ICD-10-CM

## 2014-07-28 DIAGNOSIS — I4819 Other persistent atrial fibrillation: Secondary | ICD-10-CM

## 2014-07-28 MED ORDER — DOFETILIDE 250 MCG PO CAPS: 250.0000 ug | ORAL_CAPSULE | Freq: Two times a day (BID) | ORAL | Status: DC

## 2014-07-28 NOTE — Progress Notes (Signed)
HPI The patient presents for followup of HTN.  Since I last saw her she has had a lumpectomy for breast cancer. The patient denies any new symptoms such as chest discomfort, neck or arm discomfort. There has been no new shortness of breath, PND or orthopnea. There have been no reported palpitations, presyncope or syncope.  She still has not been walking as much as I would like.  However, with her usual activity she denies any symptoms.   Allergies  Allergen Reactions  . Sulfa Antibiotics Rash    All over rash  . Acetaminophen Hives  . Banana Other (See Comments)    Unknown  . Oxycodone-Acetaminophen Itching  . Penicillins Other (See Comments)    Pt had so much as a child she's immune to it  . Prednisone Itching  . Latex Rash  . Miralax [Polyethylene Glycol] Swelling and Rash    Took [facility] brand developed rash. Patient states she tolerated name brand MiraLax in the past.  . Tape Rash    Current Outpatient Prescriptions  Medication Sig Dispense Refill  . ALPRAZolam (XANAX) 0.25 MG tablet Take 0.25 mg by mouth daily as needed for anxiety.       Marland Kitchen amLODipine (NORVASC) 2.5 MG tablet Take 2.5 mg by mouth every morning.      Marland Kitchen anastrozole (ARIMIDEX) 1 MG tablet Take 1 mg by mouth every morning.      Marland Kitchen aspirin EC 81 MG tablet Take 81 mg by mouth daily.      Marland Kitchen atorvastatin (LIPITOR) 40 MG tablet Take 60 mg by mouth daily.      Marland Kitchen dofetilide (TIKOSYN) 500 MCG capsule Take 500 mcg by mouth 2 (two) times daily.      Marland Kitchen ibuprofen (ADVIL,MOTRIN) 200 MG tablet Take 200-400 mg by mouth daily as needed. For pain      . lisinopril (PRINIVIL,ZESTRIL) 40 MG tablet Take 40 mg by mouth every morning.       . metFORMIN (GLUMETZA) 500 MG (MOD) 24 hr tablet Take 500 mg by mouth at bedtime.       . niacin (NIASPAN) 500 MG CR tablet Take 500 mg by mouth at bedtime.       . nitroGLYCERIN (NITROSTAT) 0.4 MG SL tablet Place 0.4 mg under the tongue every 5 (five) minutes as needed for chest pain.       . potassium  chloride SA (K-DUR,KLOR-CON) 20 MEQ tablet Take 20 mEq by mouth 2 (two) times daily.        . traMADol (ULTRAM) 50 MG tablet Take 50 mg by mouth every 6 (six) hours as needed for moderate pain.      Marland Kitchen warfarin (COUMADIN) 5 MG tablet Take 5-7.5 mg by mouth daily. 7.5mg  on Wednesdays and 5 mg on all other days       No current facility-administered medications for this visit.    Past Medical History  Diagnosis Date  . HLD (hyperlipidemia)   . HTN (hypertension)   . Arthritis   . Foot deformity     right  . CAD (coronary artery disease)     a. NSTEMI 02/2011: occ mid Cx, DES to OM2, residual nonobst LAD dz.  . Tobacco abuse     stopped smoking 2012  . Diabetes mellitus   . Atrial fibrillation     a. Dx 03/2011 - on tikosyn/coumadin.  . Anxiety   . Diverticulitis of colon 2008 and 04/2011  . Ureteral obstruction     History of gross  hematuria/right hydronephrosis 2/2 to uteropelvic junction obstruction, s/p cystoscopy in January 2007 with bilateral retrograde pyelography, right ureter arthroscopy, right ureteral stent placement, bladder biopsies, stent removal since then.  . Heart attack   . Wears dentures     top  . Dysrhythmia     a-fib    Past Surgical History  Procedure Laterality Date  . Tonsillectomy and adenoidectomy    . Knee arthroscopy      left  . Tubal ligation    . 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 Ureteral surgery  2011    rt ureterostomy-stent  . Coronary stent placement  2012  . Cardiac catheterization  2012    stent  . Breast lumpectomy with needle localization and axillary sentinel lymph node bx Right 03/01/2014    Procedure: BREAST LUMPECTOMY WITH NEEDLE LOCALIZATION AND AXILLARY SENTINEL LYMPH NODE BX;  Surgeon: Edward Jolly, MD;  Location: [place];  Service: General;  Laterality: Right;  . Colonoscopy with propofol N/A 06/29/2014    Procedure: COLONOSCOPY WITH PROPOFOL;  Surgeon: Garlan Fair, MD;  Location: [facility];  Service: Endoscopy;  Laterality: N/A;    ROS:  As stated in the HPI and negative for all other systems.  PHYSICAL EXAM BP 140/82  Pulse 64  Ht 5\' 8"  (1.727 m)  Wt 174 lb (78.926 kg)  BMI 26.46 kg/m2 GENERAL: Well appearing  NECK: No jugular venous distention, waveform within normal limits, carotid upstroke brisk and symmetric, no bruits, no thyromegaly   LUNGS: Clear to auscultation bilaterally  CHEST: Unremarkable  HEART: PMI not displaced or sustained,S1 and S2 within normal limits, no S3, no S4, no clicks, no rubs, no murmurs  ABD: Flat, positive bowel sounds normal in frequency in pitch, no bruits, no rebound, no guarding, no midline pulsatile mass, no hepatomegaly, no splenomegaly  EXT: 2 plus pulses throughout, no edema, no cyanosis no , 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  07/28/2014  ASSESSMENT AND PLAN  HYPERTENSION -  Her BP has been well controlled on Norvasc.   No change in therapy is indicated.   Atrial fibrillation -  She is maintaining sinus rhythm on Tikosyn.  However, the QT is slightly prolonged. I will reduce her dose to 250 mcg twice daily, check a potassium and magnesium and repeat an EKG in about one week.  CAD - The patient has no new sypmtoms.  No further cardiovascular testing is indicated.  We will continue with aggressive risk reduction and meds as listed.

## 2014-07-28 NOTE — Patient Instructions (Addendum)
Please decrease your Tikosyn to 250 mcg twice a day. Continue all other medications as listed.  Please have blood work (BMP and MG) at the [facility] on Monday.  Please have EKG at the [facility] office in 10 days after decreasing the Tikosyn.  Please call to let me know when you start so that your EKG appointment can it scheduled.  Follow up with Dr Percival Spanish in 4 months.

## 2014-07-30 ENCOUNTER — Telehealth: Payer: Self-pay | Admitting: Cardiology

## 2014-07-30 NOTE — Telephone Encounter (Signed)
Spoke with pt- she started the decreased dose of Tikosyn this am.  Scheduled her for EKG in 10 days as ordered by Dr Percival Spanish.

## 2014-07-30 NOTE — Telephone Encounter (Signed)
New problem   Pt need to speak to nurse concerning scheding an EKG 10 after she start reduced medication of tikosyn. Please call pt.

## 2014-08-02 ENCOUNTER — Ambulatory Visit (INDEPENDENT_AMBULATORY_CARE_PROVIDER_SITE_OTHER): Payer: Medicare Other | Admitting: *Deleted

## 2014-08-02 DIAGNOSIS — Z79899 Other long term (current) drug therapy: Secondary | ICD-10-CM

## 2014-08-02 DIAGNOSIS — I10 Essential (primary) hypertension: Secondary | ICD-10-CM

## 2014-08-02 LAB — BASIC METABOLIC PANEL
BUN: 20 mg/dL (ref 6–23)
CALCIUM: 8.9 mg/dL (ref 8.4–10.5)
CO2: 24 meq/L (ref 19–32)
CREATININE: 0.7 mg/dL (ref 0.4–1.2)
Chloride: 107 mEq/L (ref 96–112)
GFR: 84.98 mL/min (ref >60.00)
Glucose, Bld: 111 mg/dL — ABNORMAL HIGH (ref 70–99)
Potassium: 4.2 mEq/L (ref 3.5–5.1)
SODIUM: 139 meq/L (ref 135–145)

## 2014-08-02 LAB — MAGNESIUM: MAGNESIUM: 1.7 mg/dL (ref 1.5–2.5)

## 2014-08-09 ENCOUNTER — Ambulatory Visit (INDEPENDENT_AMBULATORY_CARE_PROVIDER_SITE_OTHER): Payer: Medicare Other | Admitting: Pharmacist

## 2014-08-09 DIAGNOSIS — I4891 Unspecified atrial fibrillation: Secondary | ICD-10-CM

## 2014-08-09 MED ORDER — MAGNESIUM OXIDE 400 MG PO TABS: 400.0000 mg | ORAL_TABLET | Freq: Two times a day (BID) | ORAL | Status: DC

## 2014-08-09 NOTE — Patient Instructions (Signed)
Your EKG looked great today.  Start taking Mag Ox 400mg  twice daily with food

## 2014-08-09 NOTE — Progress Notes (Signed)
Melody Casey is a 68 yr old female of Dr. Percival Spanish.  She has a history of atrial fibrillation that is currently treated with Tikosyn.  She was seen in the [place] office on 8/19.  At that time, EKG showed QTc of 501.  Dr. Percival Spanish decreased her dose to 269mcg BID and pt is here today for follow up EKG.   Pt reports no increase in symptoms since dose change.  Her labs were checked on 8/24.  Magnesium was 1.7.  Dr. Percival Spanish wants patient to start Mag Ox 400mg  BID.  Pt has not started this yet.   EKG today showed: NSR with vent rate of 63 bpm.  QTc: 442  Current Outpatient Prescriptions  Medication Sig Dispense Refill  . ALPRAZolam (XANAX) 0.25 MG tablet Take 0.25 mg by mouth daily as needed for anxiety.       Marland Kitchen amLODipine (NORVASC) 2.5 MG tablet Take 2.5 mg by mouth every morning.      Marland Kitchen anastrozole (ARIMIDEX) 1 MG tablet Take 1 mg by mouth every morning.      Marland Kitchen aspirin EC 81 MG tablet Take 81 mg by mouth daily.      Marland Kitchen atorvastatin (LIPITOR) 40 MG tablet Take 60 mg by mouth daily.      Marland Kitchen dofetilide (TIKOSYN) 250 MCG capsule Take 1 capsule (250 mcg total) by mouth 2 (two) times daily.  60 capsule  11  . ibuprofen (ADVIL,MOTRIN) 200 MG tablet Take 200-400 mg by mouth daily as needed. For pain      . lisinopril (PRINIVIL,ZESTRIL) 40 MG tablet Take 40 mg by mouth every morning.       . metFORMIN (GLUMETZA) 500 MG (MOD) 24 hr tablet Take 500 mg by mouth at bedtime.       . niacin (NIASPAN) 500 MG CR tablet Take 500 mg by mouth at bedtime.       . nitroGLYCERIN (NITROSTAT) 0.4 MG SL tablet Place 0.4 mg under the tongue every 5 (five) minutes as needed for chest pain.       . potassium chloride SA (K-DUR,KLOR-CON) 20 MEQ tablet Take 20 mEq by mouth 2 (two) times daily.        . traMADol (ULTRAM) 50 MG tablet Take 50 mg by mouth every 6 (six) hours as needed for moderate pain.      Marland Kitchen warfarin (COUMADIN) 5 MG tablet Take 5-7.5 mg by mouth daily. 7.5mg  on Wednesdays and 5 mg on all other days       No  current facility-administered medications for this visit.   ASSESSMENT AND PLAN  1.  Afib- Pt's QTc improved with Tikosyn 231mcg BID.  Her Mg was slightly low and so she will start Magnesium supplement today.  Plan to continue Tikosyn and repeat labs in 2 weeks.  She has an appt with Dr. Percival Spanish in November.

## 2014-08-10 ENCOUNTER — Encounter: Payer: Self-pay | Admitting: Cardiology

## 2014-08-11 ENCOUNTER — Other Ambulatory Visit: Payer: Self-pay | Admitting: *Deleted

## 2014-08-11 MED ORDER — NIACIN ER (ANTIHYPERLIPIDEMIC) 500 MG PO TBCR: 500.0000 mg | EXTENDED_RELEASE_TABLET | Freq: Every day | ORAL | Status: DC

## 2014-08-17 ENCOUNTER — Telehealth: Payer: Self-pay | Admitting: Cardiology

## 2014-08-17 DIAGNOSIS — I10 Essential (primary) hypertension: Secondary | ICD-10-CM

## 2014-08-17 MED ORDER — MAGNESIUM OXIDE 400 MG PO TABS: ORAL_TABLET | ORAL | Status: DC

## 2014-08-17 NOTE — Telephone Encounter (Signed)
Melody Casey is calling because she is not sure which medication she should be taking for magnesium. Please call    Thanks

## 2014-08-17 NOTE — Telephone Encounter (Signed)
Returned call to patient she stated she went to pharmacy on Sunday 08/15/14 to pick up MagOxide prescription and only prescription there was niaspan prescription.Stated she did not get niaspan it cost too much.Stated she has been taking niacin over the counter for 3 years.Stated she don't know why the mix up.MagOxide 400 mg 2 tablets daily with food sent to pharmacy.Advised needs repeat mag level in 2 weeks.Lab order mailed to patient.Message sent to [place] for review.

## 2014-08-22 ENCOUNTER — Other Ambulatory Visit: Payer: Self-pay | Admitting: Cardiology

## 2014-08-25 ENCOUNTER — Other Ambulatory Visit: Payer: Medicare Other

## 2014-08-31 DIAGNOSIS — I10 Essential (primary) hypertension: Secondary | ICD-10-CM | POA: Diagnosis not present

## 2014-09-01 LAB — MAGNESIUM: MAGNESIUM: 1.8 mg/dL (ref 1.5–2.5)

## 2014-09-02 DIAGNOSIS — I739 Peripheral vascular disease, unspecified: Secondary | ICD-10-CM | POA: Diagnosis not present

## 2014-09-02 DIAGNOSIS — L608 Other nail disorders: Secondary | ICD-10-CM | POA: Diagnosis not present

## 2014-09-02 DIAGNOSIS — E1159 Type 2 diabetes mellitus with other circulatory complications: Secondary | ICD-10-CM | POA: Diagnosis not present

## 2014-09-02 DIAGNOSIS — L84 Corns and callosities: Secondary | ICD-10-CM | POA: Diagnosis not present

## 2014-09-17 DIAGNOSIS — I4891 Unspecified atrial fibrillation: Secondary | ICD-10-CM | POA: Diagnosis not present

## 2014-09-20 ENCOUNTER — Telehealth: Payer: Self-pay | Admitting: Cardiology

## 2014-09-20 ENCOUNTER — Other Ambulatory Visit: Payer: Self-pay | Admitting: *Deleted

## 2014-09-20 DIAGNOSIS — I4891 Unspecified atrial fibrillation: Secondary | ICD-10-CM

## 2014-09-20 DIAGNOSIS — E878 Other disorders of electrolyte and fluid balance, not elsewhere classified: Secondary | ICD-10-CM

## 2014-09-20 NOTE — Telephone Encounter (Signed)
i talked to this pt. About her magnesium

## 2014-09-20 NOTE — Telephone Encounter (Signed)
Spoke with patient she states a female called wanting to know some information about her taking magnesium  And doing lab work. Per JC , informed patient that Dominica Severin , LPN will contact her back later today. She verbalized understanding.

## 2014-09-20 NOTE — Telephone Encounter (Signed)
Pt was returning the nurse's call in reference to her taking her Magnesium and to have some labs drawn to check her electrolytes. Please call  Thanks

## 2014-09-22 ENCOUNTER — Telehealth: Payer: Self-pay | Admitting: Cardiology

## 2014-09-22 DIAGNOSIS — I10 Essential (primary) hypertension: Secondary | ICD-10-CM | POA: Diagnosis not present

## 2014-09-22 DIAGNOSIS — Z79899 Other long term (current) drug therapy: Secondary | ICD-10-CM

## 2014-09-22 NOTE — Telephone Encounter (Signed)
KATRINA CALLED NEED LAB ORDER   LAB ORDER PLACED FOR MAGNESIUM  ORIGINAL ORDER PLACED FOR ANOTHER LAB TO DO.- (LAB ORDER WAS STOPPED)

## 2014-09-23 LAB — MAGNESIUM: MAGNESIUM: 1.8 mg/dL (ref 1.5–2.5)

## 2014-09-28 ENCOUNTER — Telehealth: Payer: Self-pay | Admitting: Cardiology

## 2014-09-28 ENCOUNTER — Other Ambulatory Visit: Payer: Self-pay | Admitting: *Deleted

## 2014-09-28 DIAGNOSIS — E878 Other disorders of electrolyte and fluid balance, not elsewhere classified: Secondary | ICD-10-CM

## 2014-09-28 NOTE — Telephone Encounter (Signed)
Per the answering service: Pt is trying to reach J C,he has some information she needs.

## 2014-10-18 DIAGNOSIS — H93292 Other abnormal auditory perceptions, left ear: Secondary | ICD-10-CM | POA: Diagnosis not present

## 2014-10-18 DIAGNOSIS — H6123 Impacted cerumen, bilateral: Secondary | ICD-10-CM | POA: Diagnosis not present

## 2014-10-19 DIAGNOSIS — E785 Hyperlipidemia, unspecified: Secondary | ICD-10-CM | POA: Diagnosis not present

## 2014-10-19 DIAGNOSIS — I82811 Embolism and thrombosis of superficial veins of right lower extremities: Secondary | ICD-10-CM | POA: Diagnosis not present

## 2014-10-19 DIAGNOSIS — E118 Type 2 diabetes mellitus with unspecified complications: Secondary | ICD-10-CM | POA: Diagnosis not present

## 2014-10-19 DIAGNOSIS — Z760 Encounter for issue of repeat prescription: Secondary | ICD-10-CM | POA: Diagnosis not present

## 2014-10-19 DIAGNOSIS — F411 Generalized anxiety disorder: Secondary | ICD-10-CM | POA: Diagnosis not present

## 2014-10-19 DIAGNOSIS — I4891 Unspecified atrial fibrillation: Secondary | ICD-10-CM | POA: Diagnosis not present

## 2014-10-22 DIAGNOSIS — R600 Localized edema: Secondary | ICD-10-CM | POA: Diagnosis not present

## 2014-10-29 DIAGNOSIS — C50911 Malignant neoplasm of unspecified site of right female breast: Secondary | ICD-10-CM | POA: Diagnosis not present

## 2014-11-10 DIAGNOSIS — E878 Other disorders of electrolyte and fluid balance, not elsewhere classified: Secondary | ICD-10-CM | POA: Diagnosis not present

## 2014-11-11 LAB — MAGNESIUM: MAGNESIUM: 1.8 mg/dL (ref 1.5–2.5)

## 2014-11-17 ENCOUNTER — Ambulatory Visit (INDEPENDENT_AMBULATORY_CARE_PROVIDER_SITE_OTHER): Payer: Medicare Other | Admitting: Cardiology

## 2014-11-17 ENCOUNTER — Encounter: Payer: Self-pay | Admitting: Cardiology

## 2014-11-17 VITALS — BP 120/60 | HR 60 | Ht 68.0 in | Wt 183.0 lb

## 2014-11-17 DIAGNOSIS — Z79899 Other long term (current) drug therapy: Secondary | ICD-10-CM | POA: Diagnosis not present

## 2014-11-17 DIAGNOSIS — I2510 Atherosclerotic heart disease of native coronary artery without angina pectoris: Secondary | ICD-10-CM | POA: Diagnosis not present

## 2014-11-17 DIAGNOSIS — I4819 Other persistent atrial fibrillation: Secondary | ICD-10-CM

## 2014-11-17 DIAGNOSIS — I481 Persistent atrial fibrillation: Secondary | ICD-10-CM | POA: Diagnosis not present

## 2014-11-17 DIAGNOSIS — I10 Essential (primary) hypertension: Secondary | ICD-10-CM

## 2014-11-17 NOTE — Progress Notes (Signed)
HPI The patient presents for followup of atrial fib.  I have had difficulty keeping her magnesium above 2.0 consistently comes 1.8. She's otherwise been doing well with Tikosyn The patient denies any new symptoms such as chest discomfort, neck or arm discomfort. There has been no new shortness of breath, PND or orthopnea. There have been no reported palpitations, presyncope or syncope.  With her usual activity she denies any symptoms.  She does have some muscle aches and pains and she blames on her treatment for breast cancer.  Allergies  Allergen Reactions  . Sulfa Antibiotics Rash    All over rash  . Acetaminophen Hives  . Banana Other (See Comments)    Unknown  . Oxycodone-Acetaminophen Itching  . Penicillins Other (See Comments)    Pt had so much as a child she's immune to it  . Prednisone Itching  . Latex Rash  . Miralax [Polyethylene Glycol] Swelling and Rash    Took [facility] brand developed rash. Patient states she tolerated name brand MiraLax in the past.  . Tape Rash    Current Outpatient Prescriptions  Medication Sig Dispense Refill  . ALPRAZolam (XANAX) 0.25 MG tablet Take 0.25 mg by mouth daily as needed for anxiety.     Marland Kitchen anastrozole (ARIMIDEX) 1 MG tablet Take 1 mg by mouth every morning.    Marland Kitchen aspirin EC 81 MG tablet Take 81 mg by mouth daily.    Marland Kitchen atorvastatin (LIPITOR) 40 MG tablet TAKE ONE & ONE-HALF TABLETS BY MOUTH IN THE EVENING. 135 tablet 0  . dofetilide (TIKOSYN) 250 MCG capsule Take 1 capsule (250 mcg total) by mouth 2 (two) times daily. 60 capsule 11  . ibuprofen (ADVIL,MOTRIN) 200 MG tablet Take 200-400 mg by mouth daily as needed. For pain    . lisinopril (PRINIVIL,ZESTRIL) 40 MG tablet Take 40 mg by mouth every morning.     . magnesium oxide (MAG-OX) 400 MG tablet Take 400 mg twice a day with food 60 tablet 6  . metFORMIN (GLUMETZA) 500 MG (MOD) 24 hr tablet Take 500 mg by mouth at bedtime.     . niacin (NIASPAN) 500 MG CR tablet Take 1 tablet (500 mg total)  by mouth at bedtime. 60 tablet 3  . nitroGLYCERIN (NITROSTAT) 0.4 MG SL tablet Place 0.4 mg under the tongue every 5 (five) minutes as needed for chest pain.     . potassium chloride SA (K-DUR,KLOR-CON) 20 MEQ tablet Take 20 mEq by mouth 2 (two) times daily.      . traMADol (ULTRAM) 50 MG tablet Take 50 mg by mouth every 6 (six) hours as needed for moderate pain.    Marland Kitchen warfarin (COUMADIN) 5 MG tablet Take 5-7.5 mg by mouth daily. 7.5mg  on Wednesdays and 5 mg on all other days    . amLODipine (NORVASC) 2.5 MG tablet Take 2.5 mg by mouth every morning.     No current facility-administered medications for this visit.    Past Medical History  Diagnosis Date  . HLD (hyperlipidemia)   . HTN (hypertension)   . Arthritis   . Foot deformity     right  . CAD (coronary artery disease)     a. NSTEMI 02/2011: occ mid Cx, DES to OM2, residual nonobst LAD dz.  . Tobacco abuse     stopped smoking 2012  . Diabetes mellitus   . Atrial fibrillation     a. Dx 03/2011 - on tikosyn/coumadin.  . Anxiety   . Diverticulitis of colon 2008  and 04/2011  . Ureteral obstruction     History of gross hematuria/right hydronephrosis 2/2 to uteropelvic junction obstruction, s/p cystoscopy in January 2007 with bilateral retrograde pyelography, right ureter arthroscopy, right ureteral stent placement, bladder biopsies, stent removal since then.  . Heart attack   . Wears dentures     top  . Dysrhythmia     a-fib    Past Surgical History  Procedure Laterality Date  . Tonsillectomy and adenoidectomy    . Knee arthroscopy      left  . Tubal ligation    . 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 Ureteral surgery  2011    rt ureterostomy-stent  . Coronary stent placement  2012  . Cardiac catheterization  2012    stent  . Breast lumpectomy with needle localization and axillary sentinel lymph node bx Right 03/01/2014    Procedure: BREAST LUMPECTOMY WITH NEEDLE LOCALIZATION AND AXILLARY  SENTINEL LYMPH NODE BX;  Surgeon: Edward Jolly, MD;  Location: [facility];  Service: General;  Laterality: Right;  . Colonoscopy with propofol N/A 06/29/2014    Procedure: COLONOSCOPY WITH PROPOFOL;  Surgeon: Garlan Fair, MD;  Location: [facility];  Service: Endoscopy;  Laterality: N/A;    ROS:  As stated in the HPI and negative for all other systems.  PHYSICAL EXAM BP 120/60 mmHg  Pulse 60  Ht 5\' 8"  (1.727 m)  Wt 183 lb (83.008 kg)  BMI 27.83 kg/m2 GENERAL: Well appearing  NECK: No jugular venous distention, waveform within normal limits, carotid upstroke brisk and symmetric, no bruits, no thyromegaly   LUNGS: Clear to auscultation bilaterally  CHEST: Unremarkable  HEART: PMI not displaced or sustained,S1 and S2 within normal limits, no S3, no S4, no clicks, no rubs, no murmurs  ABD: Flat, positive bowel sounds normal in frequency in pitch, no bruits, no rebound, no guarding, no midline pulsatile mass, no hepatomegaly, no splenomegaly  EXT: 2 plus pulses throughout, no edema, no cyanosis no , 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  11/17/2014  ASSESSMENT AND PLAN  HYPERTENSION -  Her BP has been well controlled on Norvasc.   No change in therapy is indicated.   Atrial fibrillation -  She is maintaining sinus rhythm on Tikosyn.  Her QT is acceptable. I did reduce her dose to 250 mcg twice daily.  I'm going to set her up with Tommy Medal PharmD to the review her meds and see where she's not maintaining the magnesium level I would like as well as establish as a new patient in our Coumadin clinic as she is leaving [facility].  CAD - The patient has no new sypmtoms.  No further cardiovascular testing is indicated.  We will continue with aggressive risk reduction and meds as listed.

## 2014-11-17 NOTE — Patient Instructions (Signed)
The current medical regimen is effective;  continue present plan and medications.  Follow up in the Coumadin Clinic at the [facility] office and to discuss MG level.  Follow up in 6 months with Dr. Percival Spanish in [place].  You will receive a letter in the mail 2 months before you are due.  Please call us when you receive this letter to schedule your follow up appointment.

## 2014-11-18 DIAGNOSIS — L603 Nail dystrophy: Secondary | ICD-10-CM | POA: Diagnosis not present

## 2014-11-18 DIAGNOSIS — L84 Corns and callosities: Secondary | ICD-10-CM | POA: Diagnosis not present

## 2014-11-18 DIAGNOSIS — I739 Peripheral vascular disease, unspecified: Secondary | ICD-10-CM | POA: Diagnosis not present

## 2014-11-18 DIAGNOSIS — E1151 Type 2 diabetes mellitus with diabetic peripheral angiopathy without gangrene: Secondary | ICD-10-CM | POA: Diagnosis not present

## 2014-11-22 ENCOUNTER — Ambulatory Visit (INDEPENDENT_AMBULATORY_CARE_PROVIDER_SITE_OTHER): Payer: Medicare Other | Admitting: Pharmacist Clinician (PhC)/ Clinical Pharmacy Specialist

## 2014-11-22 DIAGNOSIS — I481 Persistent atrial fibrillation: Secondary | ICD-10-CM

## 2014-11-22 DIAGNOSIS — Z79899 Other long term (current) drug therapy: Secondary | ICD-10-CM

## 2014-11-22 DIAGNOSIS — I4819 Other persistent atrial fibrillation: Secondary | ICD-10-CM

## 2014-11-22 LAB — POCT INR: INR: 2.9

## 2014-11-22 NOTE — Progress Notes (Signed)
Reviewed medications with patient, has been taking magnesium chloride, getting this by prescription, 400mg  BID.  Pt states compliance.  Asked pt to instead try MagOX 400mg  OTC.  Will have her repeat Magnesium level in 2 weeks.

## 2014-11-25 DIAGNOSIS — R040 Epistaxis: Secondary | ICD-10-CM | POA: Diagnosis not present

## 2014-11-26 ENCOUNTER — Other Ambulatory Visit: Payer: Self-pay

## 2014-11-26 MED ORDER — ATORVASTATIN CALCIUM 40 MG PO TABS: ORAL_TABLET | ORAL | Status: DC

## 2014-12-15 ENCOUNTER — Other Ambulatory Visit: Payer: Self-pay

## 2014-12-15 DIAGNOSIS — I481 Persistent atrial fibrillation: Secondary | ICD-10-CM | POA: Diagnosis not present

## 2014-12-15 DIAGNOSIS — Z79899 Other long term (current) drug therapy: Secondary | ICD-10-CM | POA: Diagnosis not present

## 2014-12-15 MED ORDER — AMLODIPINE BESYLATE 2.5 MG PO TABS
2.5000 mg | ORAL_TABLET | Freq: Every morning | ORAL | Status: DC
Start: 2014-12-15 — End: 2015-06-09

## 2014-12-16 LAB — MAGNESIUM: Magnesium: 1.9 mg/dL (ref 1.5–2.5)

## 2014-12-20 ENCOUNTER — Emergency Department (HOSPITAL_COMMUNITY)
Admission: EM | Admit: 2014-12-20 | Discharge: 2014-12-20 | Disposition: A | Discharge status: Home / Self Care | Payer: Medicare Other | Attending: Emergency Medicine | Admitting: Emergency Medicine

## 2014-12-20 ENCOUNTER — Ambulatory Visit: Payer: Medicare Other | Admitting: Pharmacist Clinician (PhC)/ Clinical Pharmacy Specialist

## 2014-12-20 ENCOUNTER — Emergency Department (HOSPITAL_COMMUNITY): Payer: Medicare Other

## 2014-12-20 ENCOUNTER — Encounter (HOSPITAL_COMMUNITY): Payer: Self-pay

## 2014-12-20 DIAGNOSIS — I2510 Atherosclerotic heart disease of native coronary artery without angina pectoris: Secondary | ICD-10-CM | POA: Insufficient documentation

## 2014-12-20 DIAGNOSIS — I700 Atherosclerosis of aorta: Secondary | ICD-10-CM | POA: Diagnosis not present

## 2014-12-20 DIAGNOSIS — E119 Type 2 diabetes mellitus without complications: Secondary | ICD-10-CM | POA: Insufficient documentation

## 2014-12-20 DIAGNOSIS — I714 Abdominal aortic aneurysm, without rupture, unspecified: Secondary | ICD-10-CM

## 2014-12-20 DIAGNOSIS — F419 Anxiety disorder, unspecified: Secondary | ICD-10-CM | POA: Diagnosis not present

## 2014-12-20 DIAGNOSIS — Z791 Long term (current) use of non-steroidal anti-inflammatories (NSAID): Secondary | ICD-10-CM | POA: Diagnosis not present

## 2014-12-20 DIAGNOSIS — K5732 Diverticulitis of large intestine without perforation or abscess without bleeding: Secondary | ICD-10-CM | POA: Diagnosis not present

## 2014-12-20 DIAGNOSIS — K5900 Constipation, unspecified: Secondary | ICD-10-CM | POA: Diagnosis not present

## 2014-12-20 DIAGNOSIS — Z7982 Long term (current) use of aspirin: Secondary | ICD-10-CM | POA: Diagnosis not present

## 2014-12-20 DIAGNOSIS — E785 Hyperlipidemia, unspecified: Secondary | ICD-10-CM | POA: Diagnosis not present

## 2014-12-20 DIAGNOSIS — R739 Hyperglycemia, unspecified: Secondary | ICD-10-CM | POA: Diagnosis not present

## 2014-12-20 DIAGNOSIS — Z87891 Personal history of nicotine dependence: Secondary | ICD-10-CM | POA: Insufficient documentation

## 2014-12-20 DIAGNOSIS — Z7901 Long term (current) use of anticoagulants: Secondary | ICD-10-CM | POA: Diagnosis not present

## 2014-12-20 DIAGNOSIS — Z91040 Latex allergy status: Secondary | ICD-10-CM | POA: Insufficient documentation

## 2014-12-20 DIAGNOSIS — K5730 Diverticulosis of large intestine without perforation or abscess without bleeding: Secondary | ICD-10-CM | POA: Diagnosis not present

## 2014-12-20 DIAGNOSIS — I10 Essential (primary) hypertension: Secondary | ICD-10-CM | POA: Insufficient documentation

## 2014-12-20 DIAGNOSIS — Z9889 Other specified postprocedural states: Secondary | ICD-10-CM | POA: Insufficient documentation

## 2014-12-20 DIAGNOSIS — Z880 Allergy status to penicillin: Secondary | ICD-10-CM | POA: Diagnosis not present

## 2014-12-20 DIAGNOSIS — Z9851 Tubal ligation status: Secondary | ICD-10-CM | POA: Diagnosis not present

## 2014-12-20 DIAGNOSIS — R1032 Left lower quadrant pain: Secondary | ICD-10-CM | POA: Diagnosis present

## 2014-12-20 DIAGNOSIS — M1990 Unspecified osteoarthritis, unspecified site: Secondary | ICD-10-CM | POA: Insufficient documentation

## 2014-12-20 DIAGNOSIS — R1111 Vomiting without nausea: Secondary | ICD-10-CM | POA: Diagnosis not present

## 2014-12-20 LAB — COMPREHENSIVE METABOLIC PANEL
ALT: 29 U/L (ref 0–35)
ANION GAP: 7 (ref 5–15)
AST: 37 U/L (ref 0–37)
Albumin: 3.2 g/dL — ABNORMAL LOW (ref 3.5–5.2)
Alkaline Phosphatase: 120 U/L — ABNORMAL HIGH (ref 39–117)
BUN: 23 mg/dL (ref 6–23)
CO2: 21 mmol/L (ref 19–32)
CREATININE: 0.96 mg/dL (ref 0.50–1.10)
Calcium: 8.7 mg/dL (ref 8.4–10.5)
Chloride: 103 mEq/L (ref 96–112)
GFR calc Af Amer: 68 mL/min — ABNORMAL LOW (ref >90)
GFR, EST NON AFRICAN AMERICAN: 59 mL/min — AB (ref >90)
Glucose, Bld: 208 mg/dL — ABNORMAL HIGH (ref 70–99)
POTASSIUM: 4.3 mmol/L (ref 3.5–5.1)
Sodium: 131 mmol/L — ABNORMAL LOW (ref 135–145)
TOTAL PROTEIN: 7.4 g/dL (ref 6.0–8.3)
Total Bilirubin: 0.7 mg/dL (ref 0.3–1.2)

## 2014-12-20 LAB — URINALYSIS, ROUTINE W REFLEX MICROSCOPIC
Bilirubin Urine: NEGATIVE
Glucose, UA: NEGATIVE mg/dL
Hgb urine dipstick: NEGATIVE
Ketones, ur: NEGATIVE mg/dL
Leukocytes, UA: NEGATIVE
Nitrite: NEGATIVE
PH: 6.5 (ref 5.0–8.0)
Protein, ur: NEGATIVE mg/dL
Specific Gravity, Urine: 1.022 (ref 1.005–1.030)
Urobilinogen, UA: 1 mg/dL (ref 0.0–1.0)

## 2014-12-20 LAB — CBC WITH DIFFERENTIAL/PLATELET
Basophils Absolute: 0 10*3/uL (ref 0.0–0.1)
Basophils Relative: 0 % (ref 0–1)
Eosinophils Absolute: 0 10*3/uL (ref 0.0–0.7)
Eosinophils Relative: 0 % (ref 0–5)
HEMATOCRIT: 33.9 % — AB (ref 36.0–46.0)
Hemoglobin: 11.1 g/dL — ABNORMAL LOW (ref 12.0–15.0)
Lymphocytes Relative: 6 % — ABNORMAL LOW (ref 12–46)
Lymphs Abs: 0.7 10*3/uL (ref 0.7–4.0)
MCH: 26.2 pg (ref 26.0–34.0)
MCHC: 32.7 g/dL (ref 30.0–36.0)
MCV: 80 fL (ref 78.0–100.0)
Monocytes Absolute: 0.5 10*3/uL (ref 0.1–1.0)
Monocytes Relative: 4 % (ref 3–12)
Neutro Abs: 10.7 10*3/uL — ABNORMAL HIGH (ref 1.7–7.7)
Neutrophils Relative %: 90 % — ABNORMAL HIGH (ref 43–77)
Platelets: 195 10*3/uL (ref 150–400)
RBC: 4.24 MIL/uL (ref 3.87–5.11)
RDW: 16.4 % — ABNORMAL HIGH (ref 11.5–15.5)
WBC: 11.9 10*3/uL — AB (ref 4.0–10.5)

## 2014-12-20 LAB — TROPONIN I: Troponin I: <0.03 ng/mL (ref <0.031)

## 2014-12-20 LAB — PROTIME-INR
INR: 2.17 — ABNORMAL HIGH (ref 0.00–1.49)
Prothrombin Time: 24.4 seconds — ABNORMAL HIGH (ref 11.6–15.2)

## 2014-12-20 LAB — LIPASE, BLOOD: Lipase: 37 U/L (ref 11–59)

## 2014-12-20 MED ORDER — IOHEXOL 300 MG/ML  SOLN
100.0000 mL | Freq: Once | INTRAMUSCULAR | Status: AC | PRN
  Administered 2014-12-20: 100 mL via INTRAVENOUS

## 2014-12-20 MED ORDER — ONDANSETRON 4 MG PO TBDP
4.0000 mg | ORAL_TABLET | Freq: Once | ORAL | Status: AC
  Administered 2014-12-20: 4 mg via ORAL
  Filled 2014-12-20: qty 1

## 2014-12-20 MED ORDER — ONDANSETRON HCL 4 MG PO TABS: 4.0000 mg | ORAL_TABLET | Freq: Three times a day (TID) | ORAL | Status: DC | PRN

## 2014-12-20 MED ORDER — IOHEXOL 300 MG/ML  SOLN: 25.0000 mL | Freq: Once | INTRAMUSCULAR | Status: DC | PRN

## 2014-12-20 MED ORDER — SODIUM CHLORIDE 0.9 % IV BOLUS (SEPSIS)
500.0000 mL | Freq: Once | INTRAVENOUS | Status: AC
Start: 2014-12-20 — End: 2014-12-20
  Administered 2014-12-20: 500 mL via INTRAVENOUS

## 2014-12-20 MED ORDER — ONDANSETRON HCL 4 MG/2ML IJ SOLN: 4.0000 mg | Freq: Once | INTRAMUSCULAR | Status: DC

## 2014-12-20 MED ORDER — ONDANSETRON HCL 4 MG/2ML IJ SOLN
4.0000 mg | Freq: Once | INTRAMUSCULAR | Status: AC
  Administered 2014-12-20: 4 mg via INTRAVENOUS
  Filled 2014-12-20: qty 2

## 2014-12-20 MED ORDER — MORPHINE SULFATE 4 MG/ML IJ SOLN: 4.0000 mg | Freq: Once | INTRAMUSCULAR | Status: DC

## 2014-12-20 MED ORDER — SODIUM CHLORIDE 0.9 % IV BOLUS (SEPSIS)
500.0000 mL | Freq: Once | INTRAVENOUS | Status: AC
  Administered 2014-12-20: 500 mL via INTRAVENOUS

## 2014-12-20 NOTE — Discharge Instructions (Signed)
Read the information below.  Use the prescribed medication as directed.  Please discuss all new medications with your pharmacist.  You may return to the Emergency Department at any time for worsening condition or any new symptoms that concern you.   If you develop high fevers, worsening abdominal pain, uncontrolled vomiting, or are unable to tolerate fluids by mouth, return to the ER for a recheck.  ° °

## 2014-12-20 NOTE — ED Notes (Signed)
CT contacted re: pt completing PO contrast

## 2014-12-20 NOTE — ED Provider Notes (Signed)
CSN: [id number]     Arrival date & time 12/20/14  0620 History   First MD Initiated Contact with Patient 12/20/14 [phone]     Chief Complaint  Patient presents with  . Abdominal Pain     (Consider location/radiation/quality/duration/timing/severity/associated sxs/prior Treatment) The history is provided by the patient and medical records.     Pt with hx diveriticulitis, HTN, HLD, CAD s/p MI, Afib, on coumadin p/w LLQ abdominal pain, chills/sweats, N/V that began two days ago.  Abdominal pain is sharp, constant, worse with movement, 8/10 intensity, no radiation.  Unable to keep medications down.  Had bowel movement two days ago, has had constipation, very small bowel movements with mucus.  Also with urinary frequency and decreased urinary output.  No dysuria.  Did have a few minutes of lightheadedness and SOB this morning.  Denies chest pain, palpitations, leg swelling.   No prior abdominal surgeries.   Past Medical History  Diagnosis Date  . HLD (hyperlipidemia)   . HTN (hypertension)   . Arthritis   . Foot deformity     right  . CAD (coronary artery disease)     a. NSTEMI 02/2011: occ mid Cx, DES to OM2, residual nonobst LAD dz.  . Tobacco abuse     stopped smoking 2012  . Diabetes mellitus   . Atrial fibrillation     a. Dx 03/2011 - on tikosyn/coumadin.  . Anxiety   . Diverticulitis of colon 2008 and 04/2011  . Ureteral obstruction     History of gross hematuria/right hydronephrosis 2/2 to uteropelvic junction obstruction, s/p cystoscopy in January 2007 with bilateral retrograde pyelography, right ureter arthroscopy, right ureteral stent placement, bladder biopsies, stent removal since then.  . Heart attack   . Wears dentures     top  . Dysrhythmia     a-fib   Past Surgical History  Procedure Laterality Date  . Tonsillectomy and adenoidectomy    . Knee arthroscopy      left  . Tubal ligation    . Right leg surgery  age 89    As a child for  ?scleroderma per patient  .  Ureteral surgery  2011    rt ureterostomy-stent  . Coronary stent placement  2012  . Cardiac catheterization  2012    stent  . Breast lumpectomy with needle localization and axillary sentinel lymph node bx Right 03/01/2014    Procedure: BREAST LUMPECTOMY WITH NEEDLE LOCALIZATION AND AXILLARY SENTINEL LYMPH NODE BX;  Surgeon: Edward Jolly, MD;  Location: [facility];  Service: General;  Laterality: Right;  . Colonoscopy with propofol N/A 06/29/2014    Procedure: COLONOSCOPY WITH PROPOFOL;  Surgeon: Garlan Fair, MD;  Location: [facility];  Service: Endoscopy;  Laterality: N/A;   Family History  Problem Relation Age of Onset  . Colon cancer Neg Hx   . Liver disease Neg Hx   . Lung cancer Father 65    deceased  . Heart failure Mother 97    deceased   History  Substance Use Topics  . Smoking status: Former Smoker -- 2.00 packs/day for 50 years    Types: Cigarettes    Quit date: 02/08/2011  . Smokeless tobacco: Never Used  . Alcohol Use: No   OB History    No data available     Review of Systems  All other systems reviewed and are negative.     Allergies  Sulfa antibiotics; Acetaminophen; Banana; Oxycodone-acetaminophen; Penicillins; Prednisone; Latex; Miralax; and Tape  Home  Medications   Prior to Admission medications   Medication Sig Start Date End Date Taking? Authorizing Provider  ALPRAZolam Duanne Moron) 0.25 MG tablet Take 0.25 mg by mouth daily as needed for anxiety.     Historical Provider, MD  amLODipine (NORVASC) 2.5 MG tablet Take 1 tablet (2.5 mg total) by mouth every morning. 12/15/14   Minus Breeding, MD  anastrozole (ARIMIDEX) 1 MG tablet Take 1 mg by mouth every morning.    Historical Provider, MD  aspirin EC 81 MG tablet Take 81 mg by mouth daily.    Historical Provider, MD  atorvastatin (LIPITOR) 40 MG tablet TAKE ONE & ONE-HALF TABLETS BY MOUTH IN THE EVENING. 11/26/14   Minus Breeding, MD  dofetilide (TIKOSYN) 250 MCG capsule Take 1  capsule (250 mcg total) by mouth 2 (two) times daily. 07/28/14   Minus Breeding, MD  ibuprofen (ADVIL,MOTRIN) 200 MG tablet Take 200-400 mg by mouth daily as needed. For pain    Historical Provider, MD  lisinopril (PRINIVIL,ZESTRIL) 40 MG tablet Take 40 mg by mouth every morning.     Historical Provider, MD  magnesium oxide (MAG-OX) 400 MG tablet Take 400 mg twice a day with food 08/17/14   Minus Breeding, MD  metFORMIN (GLUMETZA) 500 MG (MOD) 24 hr tablet Take 500 mg by mouth at bedtime.     Historical Provider, MD  niacin (NIASPAN) 500 MG CR tablet Take 1 tablet (500 mg total) by mouth at bedtime. 08/11/14   Minus Breeding, MD  nitroGLYCERIN (NITROSTAT) 0.4 MG SL tablet Place 0.4 mg under the tongue every 5 (five) minutes as needed for chest pain.     Historical Provider, MD  potassium chloride SA (K-DUR,KLOR-CON) 20 MEQ tablet Take 20 mEq by mouth 2 (two) times daily.      Historical Provider, MD  traMADol (ULTRAM) 50 MG tablet Take 50 mg by mouth every 6 (six) hours as needed for moderate pain.    Historical Provider, MD  warfarin (COUMADIN) 5 MG tablet Take 5-7.5 mg by mouth daily. 7.5mg  on Wednesdays and 5 mg on all other days    Historical Provider, MD   BP 134/58 mmHg  Pulse 80  Temp(Src) 98.4 F (36.9 C) (Oral)  Resp 17  Ht 5\' 8"  (1.727 m)  Wt 180 lb (81.647 kg)  BMI 27.38 kg/m2  SpO2 96% Physical Exam  Constitutional: She appears well-developed and well-nourished. No distress.  HENT:  Head: Normocephalic and atraumatic.  Eyes: Conjunctivae are normal.  Neck: Normal range of motion. Neck supple.  Cardiovascular: Normal rate and regular rhythm.   Pulmonary/Chest: Effort normal and breath sounds normal. No stridor. No respiratory distress. She has no wheezes. She has no rales.  Abdominal: Soft. She exhibits no distension and no mass. There is tenderness in the left lower quadrant. There is no rebound and no guarding.  Musculoskeletal: She exhibits no edema.  Neurological: She is  alert.  Skin: She is not diaphoretic.  Psychiatric: She has a normal mood and affect. Her behavior is normal.  Nursing note and vitals reviewed.   ED Course  Procedures (including critical care time) Labs Review Labs Reviewed  CBC WITH DIFFERENTIAL - Abnormal; Notable for the following:    WBC 11.9 (*)    Hemoglobin 11.1 (*)    HCT 33.9 (*)    RDW 16.4 (*)    Neutrophils Relative % 90 (*)    Lymphocytes Relative 6 (*)    Neutro Abs 10.7 (*)    All other components within  normal limits  COMPREHENSIVE METABOLIC PANEL - Abnormal; Notable for the following:    Sodium 131 (*)    Glucose, Bld 208 (*)    Albumin 3.2 (*)    Alkaline Phosphatase 120 (*)    GFR calc non Af Amer 59 (*)    GFR calc Af Amer 68 (*)    All other components within normal limits  URINALYSIS, ROUTINE W REFLEX MICROSCOPIC - Abnormal; Notable for the following:    Color, Urine AMBER (*)    APPearance HAZY (*)    All other components within normal limits  PROTIME-INR - Abnormal; Notable for the following:    Prothrombin Time 24.4 (*)    INR 2.17 (*)    All other components within normal limits  URINE CULTURE  LIPASE, BLOOD  TROPONIN I    Imaging Review Ct Abdomen Pelvis W Contrast  12/20/2014   CLINICAL DATA:  Left lower abdominal and pelvic pain with nausea, vomiting and constipation.  EXAM: CT ABDOMEN AND PELVIS WITH CONTRAST  TECHNIQUE: Multidetector CT imaging of the abdomen and pelvis was performed using the standard protocol following bolus administration of intravenous contrast.  CONTRAST:  111mL OMNIPAQUE IOHEXOL 300 MG/ML  SOLN  COMPARISON:  05/02/2011.  FINDINGS: Lower chest: Lung bases show mild dependent atelectasis and/or scarring. Three-vessel coronary artery calcification. Heart size normal. No pericardial or pleural effusion.  Hepatobiliary: A somewhat irregular low-attenuation lesion in the inferior right hepatic lobe shows peripheral filling in of contrast on delayed imaging and is unchanged  from 05/02/2011, in keeping with a hemangioma. Liver and gallbladder are otherwise unremarkable. No biliary ductal dilatation.  Pancreas: Negative.  Spleen: Negative.  Adrenals/Urinary Tract: Adrenal glands are unremarkable. Right renal cortical scarring. Low-attenuation lesions in the kidneys measure up to 1.6 cm on the left, similar to the prior exam, indicative of cysts. There is dilatation of the right intrarenal collecting system and right renal pelvis, unchanged from prior exam, with a decompressed right ureter. 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 Left ureter is decompressed. Bladder is grossly unremarkable.  Stomach/Bowel: Stomach and small bowel are unremarkable. Appendix is not readily visualized. A fair amount of stool is seen in the colon. The descending/sigmoid colon junction appears somewhat thickened and there is extensive diverticulosis. Large amount of stool is seen in the sigmoid colon. Tiny amount of fluid or scarring adjacent to the distal descending colon. Minimal associated pericolonic haziness.  Vascular/Lymphatic: Atherosclerotic calcification of the arterial vasculature. Infrarenal aorta measures up to 3.4 cm. Scattered lymph nodes are not enlarged by CT size criteria.  Reproductive: Calcified lesions in the uterus are indicative of fibroids. Ovaries are visualized.  Other: No free fluid. Mesenteries and peritoneum are otherwise unremarkable.  Musculoskeletal: No worrisome lytic or sclerotic lesions. Degenerative changes a mild scoliosis in the lumbar spine.  IMPRESSION: 1. Wall thickening, diverticulosis and mild pericolonic haziness involving the descending/sigmoid colonic junction, suggesting early/mild changes of diverticulitis. No complicating features. 2. Fair amount of stool in the colon is indicative of constipation. 3. Three-vessel coronary artery calcification. 4. Small infrarenal aortic aneurysm. Recommend followup by [place] in 3 years. This recommendation  follows ACR consensus guidelines: White Paper of the ACR Incidental Findings Committee II on Vascular Findings. J Am Coll Radiol 2013; 10:789-794.   Electronically Signed   By: Lorin Picket M.D.   On: 12/20/2014 09:58     EKG Interpretation   Date/Time:  Monday December 20 2014 06:28:34 EST Ventricular Rate:  79 PR Interval:  60 QRS Duration: 86 QT Interval:  420 QTC Calculation: 481 R Axis:   75 Text Interpretation:  Sinus rhythm Short PR interval Minimal ST  depression, lateral leads Baseline wander in lead(s) V2 No significant  change since last tracing Confirmed by ALLEN  MD, ANTHONY ([id number]) on  12/20/2014 6:34:13 AM       11:25 AM Pt tolerating PO without difficulty after zofran.  She prefers to go home and attempt PO medications.  Notes she has tramadol at home for pain and does not want anything stronger.  She also has new prescriptions of cipro and flagyl from her doctor and can take those, does not need antibiotics from me.    MDM   Final diagnoses:  Diverticulitis of large intestine without perforation or abscess without bleeding  Constipation, unspecified constipation type  Abdominal aortic aneurysm    Afebrile nontoxic patient with LLQ abdominal pain, N/V, not tolerating PO.  Labs significant for mild leukocytosis, stable anemia, hyperglycemia. CT abd/pelvis shows uncomplicated diverticulitis, incidental infrarenal AAA.  Pt informed this will need to be followed.  Pt has recurrent diverticulitis and has antibiotics at home.  Is tolerating PO and prefers to do PO abx regimen at home.  D/c home with zofran.  PCP follow up.  Discussed result, findings, treatment, and follow up  with patient.  Pt given return precautions.  Pt verbalizes understanding and agrees with plan.        Clayton Bibles, PA-C 12/20/14 [place], MD 12/21/14 [phone]

## 2014-12-20 NOTE — ED Notes (Signed)
Pt comes from home via [facility] for abd pain. Pt has recurrent diverticulitis. C/O n/v.

## 2014-12-21 LAB — URINE CULTURE
COLONY COUNT: NO GROWTH
CULTURE: NO GROWTH

## 2014-12-24 ENCOUNTER — Ambulatory Visit (INDEPENDENT_AMBULATORY_CARE_PROVIDER_SITE_OTHER): Payer: Medicare Other | Admitting: Pharmacist Clinician (PhC)/ Clinical Pharmacy Specialist

## 2014-12-24 DIAGNOSIS — I4819 Other persistent atrial fibrillation: Secondary | ICD-10-CM

## 2014-12-24 DIAGNOSIS — I481 Persistent atrial fibrillation: Secondary | ICD-10-CM

## 2014-12-24 DIAGNOSIS — Z79899 Other long term (current) drug therapy: Secondary | ICD-10-CM

## 2014-12-24 LAB — POCT INR: INR: 3.7

## 2015-01-14 ENCOUNTER — Ambulatory Visit (INDEPENDENT_AMBULATORY_CARE_PROVIDER_SITE_OTHER): Payer: Medicare Other | Admitting: Pharmacist Clinician (PhC)/ Clinical Pharmacy Specialist

## 2015-01-14 DIAGNOSIS — I481 Persistent atrial fibrillation: Secondary | ICD-10-CM | POA: Diagnosis not present

## 2015-01-14 DIAGNOSIS — Z79899 Other long term (current) drug therapy: Secondary | ICD-10-CM

## 2015-01-14 DIAGNOSIS — I4819 Other persistent atrial fibrillation: Secondary | ICD-10-CM

## 2015-01-14 LAB — POCT INR: INR: 1.6

## 2015-01-27 ENCOUNTER — Encounter: Payer: Self-pay | Admitting: Vascular Surgery

## 2015-01-27 DIAGNOSIS — I739 Peripheral vascular disease, unspecified: Secondary | ICD-10-CM | POA: Diagnosis not present

## 2015-01-27 DIAGNOSIS — L603 Nail dystrophy: Secondary | ICD-10-CM | POA: Diagnosis not present

## 2015-01-27 DIAGNOSIS — L84 Corns and callosities: Secondary | ICD-10-CM | POA: Diagnosis not present

## 2015-01-27 DIAGNOSIS — E1151 Type 2 diabetes mellitus with diabetic peripheral angiopathy without gangrene: Secondary | ICD-10-CM | POA: Diagnosis not present

## 2015-01-28 ENCOUNTER — Encounter: Payer: Self-pay | Admitting: Vascular Surgery

## 2015-01-28 ENCOUNTER — Ambulatory Visit (INDEPENDENT_AMBULATORY_CARE_PROVIDER_SITE_OTHER): Payer: Medicare Other | Admitting: Vascular Surgery

## 2015-01-28 VITALS — BP 129/81 | HR 76 | Ht 68.0 in | Wt 183.2 lb

## 2015-01-28 DIAGNOSIS — I714 Abdominal aortic aneurysm, without rupture, unspecified: Secondary | ICD-10-CM

## 2015-01-28 NOTE — Addendum Note (Signed)
Addended by: Mena Goes on: 01/28/2015 04:48 PM   Modules accepted: Orders

## 2015-01-28 NOTE — Progress Notes (Signed)
Referred by: [name], MD [address], [place] [address]  Reason for referral: AAA  History of Present Illness  The patient is a 69 y.o. (Feb 01, 1946) female who presents with chief complaint: Left shin pain.  Pt was being evaluated for diverticulitis recently had underwent a CT abd/pelvis.  This demonstrated an AAA, measuring 3.4 cm.  The patient does not have back or abdominal pain.  The patient does not have history of embolic episodes from the AAA.  The patient's risk factors for AAA included: age and smoking.  The patient no longer smokes cigarettes.  Past Medical History  Diagnosis Date  . HLD (hyperlipidemia)   . HTN (hypertension)   . Arthritis   . Foot deformity     right  . CAD (coronary artery disease)     a. NSTEMI 02/2011: occ mid Cx, DES to OM2, residual nonobst LAD dz.  . Tobacco abuse     stopped smoking 2012  . Diabetes mellitus   . Atrial fibrillation     a. Dx 03/2011 - on tikosyn/coumadin.  . Anxiety   . Diverticulitis of colon 2008 and 04/2011  . Ureteral obstruction     History of gross hematuria/right hydronephrosis 2/2 to uteropelvic junction obstruction, s/p cystoscopy in January 2007 with bilateral retrograde pyelography, right ureter arthroscopy, right ureteral stent placement, bladder biopsies, stent removal since then.  . Heart attack   . Wears dentures     top  . Dysrhythmia     a-fib  . Atrial fibrillation   . Cancer     breast    Past Surgical History  Procedure Laterality Date  . Tonsillectomy and adenoidectomy    . Knee arthroscopy      left  . Tubal ligation    . Right leg surgery  age 56    As a child for  ?scleroderma per patient  . Ureteral surgery  2011    rt ureterostomy-stent  . Coronary stent placement  2012  . Cardiac catheterization  2012    stent  . Breast lumpectomy with needle localization and axillary sentinel lymph node bx Right 03/01/2014    Procedure: BREAST LUMPECTOMY  WITH NEEDLE LOCALIZATION AND AXILLARY SENTINEL LYMPH NODE BX;  Surgeon: Edward Jolly, MD;  Location: [facility];  Service: General;  Laterality: Right;  . Colonoscopy with propofol N/A 06/29/2014    Procedure: COLONOSCOPY WITH PROPOFOL;  Surgeon: Garlan Fair, MD;  Location: [facility];  Service: Endoscopy;  Laterality: N/A;    History   Social History  . Marital Status: Married    Spouse Name: N/A  . Number of Children: 2  . 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 Smoking status: Former Smoker -- 2.00 packs/day for 50 years    Types: Cigarettes    Quit date: 02/08/2011  . Smokeless tobacco: Never Used  . Alcohol Use: No  . Drug Use: No  . Sexual Activity: Not on file   Other Topics Concern  . 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     Family History  Problem Relation Age of Onset  . Colon cancer Neg Hx   . Liver disease Neg Hx   . Lung cancer Father 8    deceased  . Cancer Father   . Heart failure  Mother 27    deceased  . Heart disease Mother   . Diabetes Brother     Current Outpatient Prescriptions on File Prior to Visit  Medication Sig Dispense Refill  . ALPRAZolam (XANAX) 0.25 MG tablet Take 0.25 mg by mouth daily as needed for anxiety.     Marland Kitchen amLODipine (NORVASC) 2.5 MG tablet Take 1 tablet (2.5 mg total) by mouth every morning. 90 tablet 1  . anastrozole (ARIMIDEX) 1 MG tablet Take 1 mg by mouth every morning.    Marland Kitchen aspirin EC 81 MG tablet Take 81 mg by mouth daily.    Marland Kitchen atorvastatin (LIPITOR) 40 MG tablet TAKE ONE & ONE-HALF TABLETS BY MOUTH IN THE EVENING. 135 tablet 1  . dofetilide (TIKOSYN) 250 MCG capsule Take 1 capsule (250 mcg total) by mouth 2 (two) times daily. 60 capsule 11  . ibuprofen (ADVIL,MOTRIN) 200 MG tablet Take 200-400 mg by mouth daily as needed. For pain    . lisinopril (PRINIVIL,ZESTRIL) 40 MG  tablet Take 40 mg by mouth every morning.     . magnesium oxide (MAG-OX) 400 MG tablet Take 400 mg twice a day with food 60 tablet 6  . metFORMIN (GLUMETZA) 500 MG (MOD) 24 hr tablet Take 500 mg by mouth at bedtime.     . niacin (NIASPAN) 500 MG CR tablet Take 1 tablet (500 mg total) by mouth at bedtime. 60 tablet 3  . nitroGLYCERIN (NITROSTAT) 0.4 MG SL tablet Place 0.4 mg under the tongue every 5 (five) minutes as needed for chest pain.     Marland Kitchen ondansetron (ZOFRAN) 4 MG tablet Take 1 tablet (4 mg total) by mouth every 8 (eight) hours as needed for nausea or vomiting. 15 tablet 0  . pantoprazole (PROTONIX) 40 MG tablet Take 40 mg by mouth daily.    . potassium chloride SA (K-DUR,KLOR-CON) 20 MEQ tablet Take 20 mEq by mouth 2 (two) times daily.      . traMADol (ULTRAM) 50 MG tablet Take 50 mg by mouth every 6 (six) hours as needed for moderate pain.    Marland Kitchen warfarin (COUMADIN) 5 MG tablet Take 5-7.5 mg by mouth daily. 7.5mg  on Wednesdays and 5 mg on all other days     No current facility-administered medications on file prior to visit.    Allergies  Allergen Reactions  . Sulfa Antibiotics Rash    All over rash  . Acetaminophen Hives  . Banana Other (See Comments)    Unknown  . Oxycodone-Acetaminophen Itching  . Penicillins Other (See Comments)    Pt had so much as a child she's immune to it  . Prednisone Itching  . Latex Rash  . Miralax [Polyethylene Glycol] Swelling and Rash    Took [facility] brand developed rash. Patient states she tolerated name brand MiraLax in the past.  . Tape Rash    REVIEW OF SYSTEMS:  (Positives checked otherwise negative)  CARDIOVASCULAR:  []  chest pain, []  chest pressure, []  palpitations, []  shortness of breath when laying flat, []  shortness of breath with exertion,  []  pain in feet when walking, []  pain in feet when laying flat, []  history of blood clot in veins (DVT), []  history of phlebitis, []  swelling in legs, []  varicose veins  PULMONARY:  []  productive  cough, []  asthma, []  wheezing  NEUROLOGIC:  []  weakness in arms or legs, []  numbness in arms or legs, []  difficulty speaking or slurred speech, []  temporary loss of vision in one eye, []  dizziness  HEMATOLOGIC:  []   bleeding problems, []  problems with blood clotting too easily  MUSCULOSKEL:  [x]  joint pain, []  joint swelling  GASTROINTEST:  []  vomiting blood, []  blood in stool     GENITOURINARY:  []  burning with urination, []  blood in urine  PSYCHIATRIC:  []  history of major depression  INTEGUMENTARY:  []  rashes, []  ulcers  CONSTITUTIONAL:  []  fever, []  chills  For VQI Use Only  PRE-ADM LIVING: Home  AMB STATUS: Ambulatory  CAD Sx: None  PRIOR CHF: None  STRESS TEST: [x]  No, [ ]  Normal, [ ]  + ischemia, [ ]  + MI, [ ]  Both   Physical Examination  Filed Vitals:   01/28/15 0858  BP: 129/81  Pulse: 76  Height: 5\' 8"  (1.727 m)  Weight: 183 lb 3.2 oz (83.099 kg)  SpO2: 100%   Body mass index is 27.86 kg/(m^2).  General: A&O x 3, WDWN  Head: [place]/AT  Ear/Nose/Throat: Hearing grossly intact, nares w/o erythema or drainage, oropharynx w/o Erythema/Exudate  Eyes: PERRLA, EOMI  Neck: Supple, no nuchal rigidity, no palpable LAD  Pulmonary: Sym exp, good air movt, CTAB, no rales, rhonchi, & wheezing  Cardiac: RRR, Nl S1, S2, no Murmurs, rubs or gallops  Vascular: Vessel Right Left  Radial Palpable Palpable  Brachial Palpable Palpable  Carotid Palpable, without bruit Palpable, without bruit  Aorta Not palpable N/A  Femoral Palpable Palpable  Popliteal Not palpable Not palpable  PT Not Palpable Not Palpable  DP Not Palpable Not Palpable   Gastrointestinal: soft, NTND, -G/R, - HSM, - masses, - CVAT B  Musculoskeletal: M/S 5/5 throughout , Extremities without ischemic changes , R leg with atrophied muscles  Neurologic: CN 2-12 intact , Pain and light touch intact in extremities , Motor exam as listed above  Psychiatric: Judgment intact, Mood & affect appropriate  for pt's clinical situation  Dermatologic: See M/S exam for extremity exam, no rashes otherwise noted  Lymph : No Cervical, Axillary, or Inguinal lymphadenopathy   CT Abd/pelvis (12/20/14) 1. Wall thickening, diverticulosis and mild pericolonic haziness involving the descending/sigmoid colonic junction, suggesting early/mild changes of diverticulitis. No complicating features. 2. Fair amount of stool in the colon is indicative of constipation. 3. Three-vessel coronary artery calcification. 4. Small infrarenal aortic aneurysm.   Based on my review of this patient's CT, she has a small AAA.  There is extensive calcified plaque throughout the aorta, mesenteric arteries and iliac and femoral arteries.  Medical Decision Making  The patient is a 69 y.o. female who presents with: small asx AAA, extensive atherosclerosis   Based on this patient's exam and diagnostic studies, she needs q2 year AAA duplex.  The threshold for repair is AAA size > 5.5 cm, growth > 1 cm/yr, and symptomatic status.  I reiterated my concerns in regards to her extensive atherosclerosis.    The patient is going to try to resume her prior lifestyle changes after her MI in 2012.  I emphasized the importance of maximal medical management including strict control of blood pressure, blood glucose, and lipid levels, antiplatelet agents, obtaining regular exercise, and cessation of smoking.    Thank you for allowing [place] to participate in this patient's care.  Adele Barthel, MD [facility] Office: [phone] Pager: [phone]  01/28/2015, 9:18 AM

## 2015-02-04 ENCOUNTER — Ambulatory Visit (INDEPENDENT_AMBULATORY_CARE_PROVIDER_SITE_OTHER): Payer: Medicare Other | Admitting: Pharmacist Clinician (PhC)/ Clinical Pharmacy Specialist

## 2015-02-04 DIAGNOSIS — Z79899 Other long term (current) drug therapy: Secondary | ICD-10-CM

## 2015-02-04 DIAGNOSIS — I4819 Other persistent atrial fibrillation: Secondary | ICD-10-CM

## 2015-02-04 DIAGNOSIS — I481 Persistent atrial fibrillation: Secondary | ICD-10-CM | POA: Diagnosis not present

## 2015-02-04 LAB — POCT INR: INR: 3

## 2015-02-25 ENCOUNTER — Ambulatory Visit (INDEPENDENT_AMBULATORY_CARE_PROVIDER_SITE_OTHER): Payer: Medicare Other | Admitting: Pharmacist Clinician (PhC)/ Clinical Pharmacy Specialist

## 2015-02-25 DIAGNOSIS — I481 Persistent atrial fibrillation: Secondary | ICD-10-CM

## 2015-02-25 DIAGNOSIS — Z79899 Other long term (current) drug therapy: Secondary | ICD-10-CM | POA: Diagnosis not present

## 2015-02-25 DIAGNOSIS — I4819 Other persistent atrial fibrillation: Secondary | ICD-10-CM

## 2015-02-25 LAB — POCT INR: INR: 2.1

## 2015-03-25 ENCOUNTER — Ambulatory Visit (INDEPENDENT_AMBULATORY_CARE_PROVIDER_SITE_OTHER): Payer: Medicare Other | Admitting: Pharmacist Clinician (PhC)/ Clinical Pharmacy Specialist

## 2015-03-25 DIAGNOSIS — I481 Persistent atrial fibrillation: Secondary | ICD-10-CM | POA: Diagnosis not present

## 2015-03-25 DIAGNOSIS — Z79899 Other long term (current) drug therapy: Secondary | ICD-10-CM

## 2015-03-25 DIAGNOSIS — I4819 Other persistent atrial fibrillation: Secondary | ICD-10-CM

## 2015-03-25 LAB — POCT INR: INR: 2.1

## 2015-04-07 DIAGNOSIS — E1151 Type 2 diabetes mellitus with diabetic peripheral angiopathy without gangrene: Secondary | ICD-10-CM | POA: Diagnosis not present

## 2015-04-07 DIAGNOSIS — L84 Corns and callosities: Secondary | ICD-10-CM | POA: Diagnosis not present

## 2015-04-07 DIAGNOSIS — I739 Peripheral vascular disease, unspecified: Secondary | ICD-10-CM | POA: Diagnosis not present

## 2015-04-07 DIAGNOSIS — L603 Nail dystrophy: Secondary | ICD-10-CM | POA: Diagnosis not present

## 2015-04-11 DIAGNOSIS — Z853 Personal history of malignant neoplasm of breast: Secondary | ICD-10-CM | POA: Diagnosis not present

## 2015-04-11 DIAGNOSIS — Z8262 Family history of osteoporosis: Secondary | ICD-10-CM | POA: Diagnosis not present

## 2015-04-11 LAB — HM MAMMOGRAPHY

## 2015-04-12 ENCOUNTER — Telehealth: Payer: Self-pay

## 2015-04-12 NOTE — Telephone Encounter (Signed)
Mammgram results dtd 04/11/15 rcvd from [place].  Reviewed by Dr. Jana Hakim.  Sent to scan.

## 2015-04-19 ENCOUNTER — Telehealth: Payer: Self-pay | Admitting: *Deleted

## 2015-04-19 NOTE — Telephone Encounter (Signed)
Patient called wanting to know the results of her bone density scan. Patient is having joint swelling in her hands that has been occuring since last month. Patient was questioning if it was from taking anastrozole. Message sent to MD and RN.   [phone] Jerilynn Mages)

## 2015-04-21 ENCOUNTER — Telehealth: Payer: Self-pay | Admitting: *Deleted

## 2015-04-21 NOTE — Telephone Encounter (Signed)
Pt called c/o joint pain and swelling and requesting to see Dr. Jana Hakim. Discussed with pt that is seemed as if it was from her AI. Pt did not want to stop medication until she sees Dr. Jana Hakim to discuss. Encourage pt to call with further needs or concerns. Received verbal understanding.

## 2015-04-22 ENCOUNTER — Ambulatory Visit (INDEPENDENT_AMBULATORY_CARE_PROVIDER_SITE_OTHER): Payer: Medicare Other | Admitting: Pharmacist Clinician (PhC)/ Clinical Pharmacy Specialist

## 2015-04-22 DIAGNOSIS — Z79899 Other long term (current) drug therapy: Secondary | ICD-10-CM | POA: Diagnosis not present

## 2015-04-22 DIAGNOSIS — I481 Persistent atrial fibrillation: Secondary | ICD-10-CM | POA: Diagnosis not present

## 2015-04-22 DIAGNOSIS — I4819 Other persistent atrial fibrillation: Secondary | ICD-10-CM

## 2015-04-22 LAB — POCT INR: INR: 2.5

## 2015-04-22 NOTE — Telephone Encounter (Signed)
Suggest stop anastrozole, wait 2 weeks and restart

## 2015-04-25 ENCOUNTER — Telehealth: Payer: Self-pay | Admitting: Oncology

## 2015-04-25 ENCOUNTER — Ambulatory Visit (HOSPITAL_BASED_OUTPATIENT_CLINIC_OR_DEPARTMENT_OTHER): Payer: Medicare Other | Admitting: Oncology

## 2015-04-25 VITALS — BP 136/73 | HR 65 | Temp 98.0°F | Resp 18 | Ht 68.0 in | Wt 181.9 lb

## 2015-04-25 DIAGNOSIS — C50411 Malignant neoplasm of upper-outer quadrant of right female breast: Secondary | ICD-10-CM | POA: Diagnosis not present

## 2015-04-25 DIAGNOSIS — Z170 Estrogen receptor positive status [ER+]: Secondary | ICD-10-CM

## 2015-04-25 DIAGNOSIS — M810 Age-related osteoporosis without current pathological fracture: Secondary | ICD-10-CM

## 2015-04-25 DIAGNOSIS — I4891 Unspecified atrial fibrillation: Secondary | ICD-10-CM

## 2015-04-25 DIAGNOSIS — F17200 Nicotine dependence, unspecified, uncomplicated: Secondary | ICD-10-CM

## 2015-04-25 NOTE — Telephone Encounter (Signed)
Gave avs & calendar for July °

## 2015-04-25 NOTE — Telephone Encounter (Signed)
Confirmed lab for 07/05.

## 2015-04-25 NOTE — Progress Notes (Signed)
[place]  [phone] [phone]     ID: Melody Casey OB: 10-May-1946  MR#: [id number]  XFG#:[id number]  PCP: Melody Ou, MD GYN:   SU: Melody Casey OTHER MD: Melody Casey, Melody Casey, Melody Casey, Melody Casey,  CHIEF COMPLAINT: Estrogen receptor positive breast cancer   CURRENT TREATMENT: Antiestrogen therapy  BREAST CANCER HISTORY: From the original intake note:   "Melody Casey" had routine breast mammography at [facility] 01/14/2014. The breast density was category B. A new cluster of heterogeneous calcifications were noted in the right breast and on 01/28/2014 the patient underwent stereotactic biopsy of this area with the pathology ([id number]) showing an invasive ductal carcinoma, grade 1, estrogen receptor 100% positive, with strong staining intensity; progesterone receptor 63% positive, with moderate staining intensity, with an MIB-1 of 26% and no HER-2 amplification, the ratio being 1.44 and the number per cell 2.80.  On 02/01/2014 the patient underwent bilateral MRI of the breast, with no enhancement associated with the previously biopsied area. However there was a separate 7 mm enhancing mass in the lower inner quadrant and biopsy of this mass 02/03/2014 showed benign breast parenchyma, with no atypia. Review of this pathology showed atypical ductal hyperplasia, and therefore excisional biopsy was recommended, performed 02/10/2014 and showing only atypical ductal hyperplasia, with adenosis and calcifications ([id number]).  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 Tumor.   Her subsequent history is as detailed below  INTERVAL HISTORY: Melody Casey returns today for followup of her breast cancer. She started anastrozole a year ago in general he was tolerating it well. Early April  2016 however she started having pain and swelling in both hands. She also has chronic left knee pain and a little bit of hip pain but the hands are the main issue right now. She wonders if anastrozole as the cause. She also wondered if it's the new company making Coumadin, which also changed in early April.  REVIEW OF SYSTEMS: The pain in the hands is worse in the morning. She can barely grab things then and she has trouble lifting a gallon of milk. The left knee pain is chronic. A detailed review of systems today was otherwise stable  PAST MEDICAL HISTORY: Past Medical History  Diagnosis Date  . HLD (hyperlipidemia)   . HTN (hypertension)   . Arthritis   . Foot deformity     right  . CAD (coronary artery disease)     a. NSTEMI 02/2011: occ mid Cx, DES to OM2, residual nonobst LAD dz.  . Tobacco abuse     stopped smoking 2012  . Diabetes mellitus   . Atrial fibrillation     a. Dx 03/2011 - on tikosyn/coumadin.  . Anxiety   . Diverticulitis of colon 2008 and 04/2011  . Ureteral obstruction     History of gross hematuria/right hydronephrosis 2/2 to uteropelvic junction obstruction, s/p cystoscopy in January 2007 with bilateral retrograde pyelography, right ureter arthroscopy, right ureteral stent placement, bladder biopsies, stent removal since then.  . Heart attack   . Wears dentures     top  . Dysrhythmia     a-fib  . Atrial fibrillation   . Cancer     breast    PAST SURGICAL HISTORY: Past Surgical History  Procedure Laterality Date  . Tonsillectomy and adenoidectomy    . Knee arthroscopy  left  . Tubal ligation    . Right leg surgery  age 2    As a child for  ?scleroderma per patient  . Ureteral surgery  2011    rt ureterostomy-stent  . Coronary stent placement  2012  . Cardiac catheterization  2012    stent  . Breast lumpectomy with needle localization and axillary sentinel lymph node bx Right 03/01/2014    Procedure: BREAST LUMPECTOMY WITH NEEDLE LOCALIZATION AND  AXILLARY SENTINEL LYMPH NODE BX;  Surgeon: Melody Jolly, MD;  Location: [place];  Service: General;  Laterality: Right;  . Colonoscopy with propofol N/A 06/29/2014    Procedure: COLONOSCOPY WITH PROPOFOL;  Surgeon: Melody Fair, MD;  Location: [facility];  Service: Endoscopy;  Laterality: N/A;    FAMILY HISTORY Family History  Problem Relation Age of Onset  . Colon cancer Neg Hx   . Liver disease Neg Hx   . Lung cancer Father 2    deceased  . Cancer Father   . Heart failure Mother 74    deceased  . Heart disease Mother   . Diabetes Brother   The patient's father died at the age of 4 with a history of lung cancer. He was a smoker and also had asbestos exposure. The patient's mother died at the age of 24. The patient had one brother. There is no other history of cancer in the family to her knowledge  GYNECOLOGIC HISTORY:  Menarche age 71, first live birth age 60. The patient stopped having periods approximately 1997. She did not take hormone replacement.   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 Her husband of 15 years, Melody Casey is a retired Futures trader. He has a history of [facility]. Daughter Melody Casey lives in [name] and is a Scientist, clinical (histocompatibility and immunogenetics). Son Melody Casey lives in [name] and he is an Conservator, museum/gallery. The patient has 7 grandchildren and 2 great-grandchildren. She attends [employer]     ADVANCED DIRECTIVES:  in place    HEALTH MAINTENANCE: History  Substance Use Topics  . Smoking status: Former Smoker -- 2.00 packs/day for 50 years    Types: Cigarettes    Quit date: 02/08/2011  . Smokeless tobacco: Never Used  . Alcohol Use: No     Colonoscopy: never   PAP: 2013  Bone density:Never  Lipid panel:  Allergies  Allergen Reactions  . Sulfa Antibiotics Rash    All over rash  .  Acetaminophen Hives  . Banana Other (See Comments)    Unknown  . Oxycodone-Acetaminophen Itching  . Penicillins Other (See Comments)    Pt had so much as a child she's immune to it  . Prednisone Itching  . Latex Rash  . Miralax [Polyethylene Glycol] Swelling and Rash    Took [facility] brand developed rash. Patient states she tolerated name brand MiraLax in the past.  . Tape Rash    Current Outpatient Prescriptions  Medication Sig Dispense Refill  . ALPRAZolam (XANAX) 0.25 MG tablet Take 0.25 mg by mouth daily as needed for anxiety.     Marland Kitchen amLODipine (NORVASC) 2.5 MG tablet Take 1 tablet (2.5 mg total) by mouth every morning. 90 tablet 1  . anastrozole (ARIMIDEX) 1 MG tablet Take 1 mg by mouth every morning.    Marland Kitchen aspirin EC 81 MG tablet Take 81 mg by mouth daily.    Marland Kitchen  atorvastatin (LIPITOR) 40 MG tablet TAKE ONE & ONE-HALF TABLETS BY MOUTH IN THE EVENING. 135 tablet 1  . ciprofloxacin (CIPRO) 500 MG tablet     . dofetilide (TIKOSYN) 250 MCG capsule Take 1 capsule (250 mcg total) by mouth 2 (two) times daily. 60 capsule 11  . ibuprofen (ADVIL,MOTRIN) 200 MG tablet Take 200-400 mg by mouth daily as needed. For pain    . lisinopril (PRINIVIL,ZESTRIL) 40 MG tablet Take 40 mg by mouth every morning.     . magnesium oxide (MAG-OX) 400 MG tablet Take 400 mg twice a day with food 60 tablet 6  . metFORMIN (GLUCOPHAGE) 500 MG tablet     . metFORMIN (GLUMETZA) 500 MG (MOD) 24 hr tablet Take 500 mg by mouth at bedtime.     . niacin (NIASPAN) 500 MG CR tablet Take 1 tablet (500 mg total) by mouth at bedtime. 60 tablet 3  . nitroGLYCERIN (NITROSTAT) 0.4 MG SL tablet Place 0.4 mg under the tongue every 5 (five) minutes as needed for chest pain.     Marland Kitchen ondansetron (ZOFRAN) 4 MG tablet Take 1 tablet (4 mg total) by mouth every 8 (eight) hours as needed for nausea or vomiting. 15 tablet 0  . pantoprazole (PROTONIX) 40 MG tablet Take 40 mg by mouth daily.    . potassium chloride SA (K-DUR,KLOR-CON) 20 MEQ tablet  Take 20 mEq by mouth 2 (two) times daily.      . traMADol (ULTRAM) 50 MG tablet Take 50 mg by mouth every 6 (six) hours as needed for moderate pain.    Marland Kitchen warfarin (COUMADIN) 5 MG tablet Take 5-7.5 mg by mouth daily. 7.8m on Wednesdays and 5 mg on all other days     No current facility-administered medications for this visit.    OBJECTIVE: Older white woman in no acute distress Filed Vitals:   04/25/15 1415  BP: 136/73  Pulse: 65  Temp: 98 F (36.7 C)  Resp: 18     Body mass index is 27.66 kg/(m^2).    ECOG FS:1 - Symptomatic but completely ambulatory  Sclerae unicteric, EOMs intact Oropharynx clear, full upper plate, incisor missing No cervical or supraclavicular adenopathy Lungs no rales or rhonchi Heart regular rate and rhythm Abd soft, nontender, positive bowel sounds MSK no focal spinal tenderness, no upper extremity lymphedema Neuro: nonfocal, well oriented, appropriate affect Breasts: Deferred    LAB RESULTS:  CMP     Component Value Date/Time   NA 131* 12/20/2014 0645   NA 138 06/17/2014 1531   K 4.3 12/20/2014 0645   K 3.9 06/17/2014 1531   CL 103 12/20/2014 0645   CO2 21 12/20/2014 0645   CO2 24 06/17/2014 1531   GLUCOSE 208* 12/20/2014 0645   GLUCOSE 143* 06/17/2014 1531   BUN 23 12/20/2014 0645   BUN 16.9 06/17/2014 1531   CREATININE 0.96 12/20/2014 0645   CREATININE 0.8 06/17/2014 1531   CREATININE 0.57 05/23/2011 1300   CALCIUM 8.7 12/20/2014 0645   CALCIUM 9.8 06/17/2014 1531   PROT 7.4 12/20/2014 0645   PROT 8.0 06/17/2014 1531   ALBUMIN 3.2* 12/20/2014 0645   ALBUMIN 3.5 06/17/2014 1531   AST 37 12/20/2014 0645   AST 43* 06/17/2014 1531   ALT 29 12/20/2014 0645   ALT 63* 06/17/2014 1531   ALKPHOS 120* 12/20/2014 0645   ALKPHOS 104 06/17/2014 1531   BILITOT 0.7 12/20/2014 0645   BILITOT 0.49 06/17/2014 1531   GFRNONAA 59* 12/20/2014 0645   GFRAA 68* 12/20/2014  0645    I No results found for: SPEP  Lab Results  Component Value Date     WBC 11.9* 12/20/2014   NEUTROABS 10.7* 12/20/2014   HGB 11.1* 12/20/2014   HCT 33.9* 12/20/2014   MCV 80.0 12/20/2014   PLT 195 12/20/2014      Chemistry      Component Value Date/Time   NA 131* 12/20/2014 0645   NA 138 06/17/2014 1531   K 4.3 12/20/2014 0645   K 3.9 06/17/2014 1531   CL 103 12/20/2014 0645   CO2 21 12/20/2014 0645   CO2 24 06/17/2014 1531   BUN 23 12/20/2014 0645   BUN 16.9 06/17/2014 1531   CREATININE 0.96 12/20/2014 0645   CREATININE 0.8 06/17/2014 1531   CREATININE 0.57 05/23/2011 1300      Component Value Date/Time   CALCIUM 8.7 12/20/2014 0645   CALCIUM 9.8 06/17/2014 1531   ALKPHOS 120* 12/20/2014 0645   ALKPHOS 104 06/17/2014 1531   AST 37 12/20/2014 0645   AST 43* 06/17/2014 1531   ALT 29 12/20/2014 0645   ALT 63* 06/17/2014 1531   BILITOT 0.7 12/20/2014 0645   BILITOT 0.49 06/17/2014 1531       No results found for: LABCA2  No components found for: LABCA125   Recent Labs Lab 04/22/15 1559  INR 2.5    Urinalysis    Component Value Date/Time   COLORURINE AMBER* 12/20/2014 0839   APPEARANCEUR HAZY* 12/20/2014 0839   LABSPEC 1.022 12/20/2014 0839   PHURINE 6.5 12/20/2014 0839   GLUCOSEU NEGATIVE 12/20/2014 0839   GLUCOSEU NEGATIVE 04/05/2008 0833   HGBUR NEGATIVE 12/20/2014 [place] 12/20/2014 0839   [place] 12/20/2014 0839   PROTEINUR NEGATIVE 12/20/2014 0839   UROBILINOGEN 1.0 12/20/2014 0839   NITRITE NEGATIVE 12/20/2014 0839   LEUKOCYTESUR NEGATIVE 12/20/2014 0839    STUDIES: Bone density at [facility] 04/11/2015 showed a T score of -3.5 at the right femoral neck  ASSESSMENT: 69 y.o. [place] woman status post right breast biopsy  01/28/2014 for a clinical T1b N0, stage IA invasive ductal carcinoma, grade 1, estrogen and progesterone receptor positive, HER-2 not amplified, with an MIB-1 of 26%  (1) Biopsy of 2 additional suspicious areas in the right breast 02/03/2014 and 02/10/2014  showed only atypical ductal hyperplasia  (2) status post right lumpectomy and sentinel lymph node sampling 03/01/2014 showed an additional 3 mm area of invasive ductal carcinoma, grade 1, with negative margins. The single sentinel lymph node was negative.  (3) Opted against adjuvant radiation given the marginal benefit of local recurrence  (4) started anastrozole 03/29/2014, interrupted May 2016 with joint swelling  (a) osteoporosis with T score -3.5 on bone density scan 04/11/2015  PLAN: Melody Casey was on anastrozole for a year before developing the swelling in her hands, so my presumption is that this is going to be unrelated. Nevertheless anastrozole certainly could cause this problem. Accordingly we are going to go off the anastrozole for the next 2 months. If she still having problems then we will refer her to rheumatology. Just in case I am checking an ESR and ANA on her prior to the next visit.  Otherwise, if the problem resolves, we will consider an alternative, most likely exemestane. Tamoxifen remains a possibility, but she has atrial fibrillation and of course tamoxifen can increase the risk of clotting. Melody Casey is on chronic anticoagulation as noted.  She has not been to the dentist in quite a while. I urged her to  get her teeth checked between now and her next visit here so that if we decide to do zolendronate or denosumab we won't have to worry about osteonecrosis of the jaw  She has a good understanding of the overall plan. She agrees with it. She knows the goal of treatment in her case cure. She  will call for any problems that may develop before her next visit here.   Chauncey Cruel, MD   04/25/2015 2:26 PM

## 2015-05-02 DIAGNOSIS — I4891 Unspecified atrial fibrillation: Secondary | ICD-10-CM | POA: Diagnosis not present

## 2015-05-02 DIAGNOSIS — M79642 Pain in left hand: Secondary | ICD-10-CM | POA: Diagnosis not present

## 2015-05-02 DIAGNOSIS — M79641 Pain in right hand: Secondary | ICD-10-CM | POA: Diagnosis not present

## 2015-05-05 ENCOUNTER — Telehealth: Payer: Self-pay | Admitting: *Deleted

## 2015-05-05 NOTE — Telephone Encounter (Signed)
TC from pt regarding her anastrozole. She saw Dr. Jana Hakim mid May and d/t swelling, pain in her hands, her Anastrozole was stopped. 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  She wants to know if she can now restart her Anastrozole.  If she is to restart she will need a new prescription called in to her [facility] [address].  Please let patient know.

## 2015-05-05 NOTE — Telephone Encounter (Signed)
Attempted to return call to pt with 1st attempt of bad connection with dropping of call.  2nd attempt- phone rang with VM for " Melody Casey verified number. General message left to return call to this RN.  Note need to know who diagnosed her and if she is under therapy.  Arimidex can exacerbate arthritis- may need to continue to hold per 2 month recommendation per visit if she is under active treatment so new baseline can be obtained.

## 2015-05-12 ENCOUNTER — Telehealth: Payer: Self-pay | Admitting: *Deleted

## 2015-05-12 NOTE — Telephone Encounter (Signed)
Fine to wait until she sees rheumatologist; she should call us w plan (and make sure rheum sends [place] their note)

## 2015-05-12 NOTE — Telephone Encounter (Signed)
Let pt know it is fine to hold the anastrazole.  Requested copy of treatment and rheumatologist office note.  Pt voiced understanding.

## 2015-05-12 NOTE — Telephone Encounter (Signed)
TC from pt requesting new prescription for Anastrozole. Pt has appt to see Rheumatologist on 05/24/15 and then she will find out what her treatment will be for her new dx of Rheumatoid Arthritis.  She wants to know if she can restart her Anastrozole or wait until she knows what therapy she will have for RA. Per Val, RN previous note, it appears she might need to wait.  Please advise

## 2015-05-19 ENCOUNTER — Other Ambulatory Visit: Payer: Self-pay

## 2015-05-19 MED ORDER — ATORVASTATIN CALCIUM 40 MG PO TABS: ORAL_TABLET | ORAL | Status: DC

## 2015-05-19 NOTE — Telephone Encounter (Signed)
Per note 12.9.15

## 2015-05-20 ENCOUNTER — Ambulatory Visit: Payer: Medicare Other | Admitting: Pharmacist Clinician (PhC)/ Clinical Pharmacy Specialist

## 2015-05-24 DIAGNOSIS — M25442 Effusion, left hand: Secondary | ICD-10-CM | POA: Diagnosis not present

## 2015-05-24 DIAGNOSIS — E119 Type 2 diabetes mellitus without complications: Secondary | ICD-10-CM | POA: Diagnosis not present

## 2015-05-24 DIAGNOSIS — M25441 Effusion, right hand: Secondary | ICD-10-CM | POA: Diagnosis not present

## 2015-05-24 DIAGNOSIS — M25462 Effusion, left knee: Secondary | ICD-10-CM | POA: Diagnosis not present

## 2015-05-24 DIAGNOSIS — Z79899 Other long term (current) drug therapy: Secondary | ICD-10-CM | POA: Diagnosis not present

## 2015-05-24 DIAGNOSIS — M25432 Effusion, left wrist: Secondary | ICD-10-CM | POA: Diagnosis not present

## 2015-05-24 DIAGNOSIS — M25431 Effusion, right wrist: Secondary | ICD-10-CM | POA: Diagnosis not present

## 2015-05-25 ENCOUNTER — Ambulatory Visit (INDEPENDENT_AMBULATORY_CARE_PROVIDER_SITE_OTHER): Payer: Medicare Other | Admitting: Pharmacist Clinician (PhC)/ Clinical Pharmacy Specialist

## 2015-05-25 DIAGNOSIS — I481 Persistent atrial fibrillation: Secondary | ICD-10-CM

## 2015-05-25 DIAGNOSIS — Z79899 Other long term (current) drug therapy: Secondary | ICD-10-CM

## 2015-05-25 DIAGNOSIS — I4819 Other persistent atrial fibrillation: Secondary | ICD-10-CM

## 2015-05-25 LAB — POCT INR: INR: 2.8

## 2015-06-01 ENCOUNTER — Ambulatory Visit (INDEPENDENT_AMBULATORY_CARE_PROVIDER_SITE_OTHER): Payer: Medicare Other | Admitting: Pharmacist

## 2015-06-01 DIAGNOSIS — Z79899 Other long term (current) drug therapy: Secondary | ICD-10-CM

## 2015-06-01 DIAGNOSIS — I481 Persistent atrial fibrillation: Secondary | ICD-10-CM | POA: Diagnosis not present

## 2015-06-01 DIAGNOSIS — I4819 Other persistent atrial fibrillation: Secondary | ICD-10-CM

## 2015-06-01 LAB — POCT INR: INR: 2.4

## 2015-06-09 ENCOUNTER — Other Ambulatory Visit: Payer: Self-pay | Admitting: *Deleted

## 2015-06-09 MED ORDER — AMLODIPINE BESYLATE 2.5 MG PO TABS: 2.5000 mg | ORAL_TABLET | Freq: Every morning | ORAL | Status: DC

## 2015-06-14 ENCOUNTER — Other Ambulatory Visit: Payer: Medicare Other

## 2015-06-15 ENCOUNTER — Other Ambulatory Visit: Payer: Self-pay | Admitting: Nurse Practitioner

## 2015-06-20 ENCOUNTER — Telehealth: Payer: Self-pay | Admitting: Nurse Practitioner

## 2015-06-20 ENCOUNTER — Ambulatory Visit (HOSPITAL_BASED_OUTPATIENT_CLINIC_OR_DEPARTMENT_OTHER): Payer: Medicare Other | Admitting: Nurse Practitioner

## 2015-06-20 ENCOUNTER — Other Ambulatory Visit: Payer: Medicare Other

## 2015-06-20 ENCOUNTER — Encounter: Payer: Self-pay | Admitting: Nurse Practitioner

## 2015-06-20 VITALS — BP 148/66 | HR 70 | Temp 98.0°F | Resp 18 | Ht 68.0 in | Wt 182.2 lb

## 2015-06-20 DIAGNOSIS — M810 Age-related osteoporosis without current pathological fracture: Secondary | ICD-10-CM

## 2015-06-20 DIAGNOSIS — M069 Rheumatoid arthritis, unspecified: Secondary | ICD-10-CM

## 2015-06-20 DIAGNOSIS — C50411 Malignant neoplasm of upper-outer quadrant of right female breast: Secondary | ICD-10-CM

## 2015-06-20 DIAGNOSIS — Z170 Estrogen receptor positive status [ER+]: Secondary | ICD-10-CM

## 2015-06-20 DIAGNOSIS — Z79811 Long term (current) use of aromatase inhibitors: Secondary | ICD-10-CM | POA: Diagnosis not present

## 2015-06-20 MED ORDER — ANASTROZOLE 1 MG PO TABS: 1.0000 mg | ORAL_TABLET | Freq: Every day | ORAL | Status: DC

## 2015-06-20 NOTE — Telephone Encounter (Signed)
Gave avs & calenedar for January.

## 2015-06-20 NOTE — Progress Notes (Signed)
Welcome  Telephone:(336) [phone] Fax:(336) [phone]     ID: Melody Casey OB: 08-28-1946  MR#: [id number]  FFM#:[id number]  PCP: [name], MD GYN:   SU: Excell Seltzer OTHER MD: [name], Arley Phenix, Christene Slates, Earle Gell,  CHIEF COMPLAINT: Estrogen receptor positive breast cancer   CURRENT TREATMENT: Antiestrogen therapy  BREAST CANCER HISTORY: From the original intake note:   "Melody Casey" had routine breast mammography at [facility] 01/14/2014. The breast density was category B. A new cluster of heterogeneous calcifications were noted in the right breast and on 01/28/2014 the patient underwent stereotactic biopsy of this area with the pathology ([id number]) showing an invasive ductal carcinoma, grade 1, estrogen receptor 100% positive, with strong staining intensity; progesterone receptor 63% positive, with moderate staining intensity, with an MIB-1 of 26% and no HER-2 amplification, the ratio being 1.44 and the number per cell 2.80.  On 02/01/2014 the patient underwent bilateral MRI of the breast, with no enhancement associated with the previously biopsied area. However there was a separate 7 mm enhancing mass in the lower inner quadrant and biopsy of this mass 02/03/2014 showed benign breast parenchyma, with no atypia. Review of this pathology showed atypical ductal hyperplasia, and therefore excisional biopsy was recommended, performed 02/10/2014 and showing only atypical ductal hyperplasia, with adenosis and calcifications ([id number]).  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 Tumor.   Her subsequent history is as detailed below  INTERVAL HISTORY: Melody Casey returns today for follow up of her breast cancer. She has been off of anastrozole for 2 months and recently diagnosed with rheumatoid  arthritis. She is on a prednisone taper and is finally seeing some results. She is here to restart the anastrozole.   REVIEW OF SYSTEMS: Melody Casey has been experiencing new left hip pain x1 week. She stood to go to the bathroom and felt a "catching" sensation. Now it comes and goes and she is reduced to walking with a cane. She is taking NSAIDs PRN for this but it is minimally helpful. She has chronic left knee pain. Otherwise she has no complaints offer.    PAST MEDICAL HISTORY: Past Medical History  Diagnosis Date  . HLD (hyperlipidemia)   . HTN (hypertension)   . Arthritis   . Foot deformity     right  . CAD (coronary artery disease)     a. NSTEMI 02/2011: occ mid Cx, DES to OM2, residual nonobst LAD dz.  . Tobacco abuse     stopped smoking 2012  . Diabetes mellitus   . Atrial fibrillation     a. Dx 03/2011 - on tikosyn/coumadin.  . Anxiety   . Diverticulitis of colon 2008 and 04/2011  . Ureteral obstruction     History of gross hematuria/right hydronephrosis 2/2 to uteropelvic junction obstruction, s/p cystoscopy in January 2007 with bilateral retrograde pyelography, right ureter arthroscopy, right ureteral stent placement, bladder biopsies, stent removal since then.  . Heart attack   . Wears dentures     top  . Dysrhythmia     a-fib  . Atrial fibrillation   . Cancer     breast    PAST SURGICAL HISTORY: Past Surgical History  Procedure Laterality Date  . Tonsillectomy and adenoidectomy    . Knee arthroscopy      left  . Tubal ligation    .  Right leg surgery  age 43    As a child for  ?scleroderma per patient  . Ureteral surgery  2011    rt ureterostomy-stent  . Coronary stent placement  2012  . Cardiac catheterization  2012    stent  . Breast lumpectomy with needle localization and axillary sentinel lymph node bx Right 03/01/2014    Procedure: BREAST LUMPECTOMY WITH NEEDLE LOCALIZATION AND AXILLARY SENTINEL LYMPH NODE BX;  Surgeon: Edward Jolly, MD;  Location:  [place];  Service: General;  Laterality: Right;  . Colonoscopy with propofol N/A 06/29/2014    Procedure: COLONOSCOPY WITH PROPOFOL;  Surgeon: Garlan Fair, MD;  Location: [facility];  Service: Endoscopy;  Laterality: N/A;    FAMILY HISTORY Family History  Problem Relation Age of Onset  . Colon cancer Neg Hx   . Liver disease Neg Hx   . Lung cancer Father 63    deceased  . Cancer Father   . Heart failure Mother 48    deceased  . Heart disease Mother   . Diabetes Brother   The patient's father died at the age of 86 with a history of lung cancer. He was a smoker and also had asbestos exposure. The patient's mother died at the age of 52. The patient had one brother. There is no other history of cancer in the family to her knowledge  GYNECOLOGIC HISTORY:  Menarche age 48, first live birth age 37. The patient stopped having periods approximately 1997. She did not take hormone replacement.   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 Her husband of 64 years, Melody Casey is a retired Futures trader. He has a history of [facility]. Daughter Melody Casey lives in [name] and is a Scientist, clinical (histocompatibility and immunogenetics). Son Melody Casey lives in [name] and he is an Conservator, museum/gallery. The patient has 7 grandchildren and 2 great-grandchildren. She attends [employer]     ADVANCED DIRECTIVES:  in place    HEALTH MAINTENANCE: History  Substance Use Topics  . Smoking status: Former Smoker -- 2.00 packs/day for 50 years    Types: Cigarettes    Quit date: 02/08/2011  . Smokeless tobacco: Never Used  . Alcohol Use: No     Colonoscopy: never   PAP: 2013  Bone density:Never  Lipid panel:  Allergies  Allergen Reactions  . Sulfa Antibiotics Rash    All over rash  . Acetaminophen Hives  . Banana Other (See Comments)    Unknown  .  Oxycodone-Acetaminophen Itching  . Penicillins Other (See Comments)    Pt had so much as a child she's immune to it  . Prednisone Itching  . Latex Rash  . Miralax [Polyethylene Glycol] Swelling and Rash    Took [facility] brand developed rash. Patient states she tolerated name brand MiraLax in the past.  . Tape Rash    Current Outpatient Prescriptions  Medication Sig Dispense Refill  . ALPRAZolam (XANAX) 0.25 MG tablet Take 0.25 mg by mouth daily as needed for anxiety.     Marland Kitchen amLODipine (NORVASC) 2.5 MG tablet Take 1 tablet (2.5 mg total) by mouth every morning. 30 tablet 0  . aspirin EC 81 MG tablet Take 81 mg by mouth daily.    Marland Kitchen atorvastatin (LIPITOR) 40 MG tablet TAKE ONE & ONE-HALF TABLETS BY MOUTH IN THE EVENING. 135 tablet 0  . calcium-vitamin  D (OSCAL WITH D) 500-200 MG-UNIT per tablet Take 1 tablet by mouth 2 (two) times daily.    Marland Kitchen dofetilide (TIKOSYN) 250 MCG capsule Take 1 capsule (250 mcg total) by mouth 2 (two) times daily. 60 capsule 11  . ibuprofen (ADVIL,MOTRIN) 200 MG tablet Take 200-400 mg by mouth daily as needed. For pain    . magnesium oxide (MAG-OX) 400 MG tablet Take 400 mg twice a day with food 60 tablet 6  . metFORMIN (GLUMETZA) 500 MG (MOD) 24 hr tablet Take 500 mg by mouth at bedtime.     . niacin (NIASPAN) 500 MG CR tablet Take 1 tablet (500 mg total) by mouth at bedtime. 60 tablet 3  . pantoprazole (PROTONIX) 40 MG tablet Take 40 mg by mouth daily.    . potassium chloride SA (K-DUR,KLOR-CON) 20 MEQ tablet Take 20 mEq by mouth 2 (two) times daily.      . predniSONE (DELTASONE) 10 MG tablet 30 mg x 1 wk, 20 mg x 1 wk, then 15 mg x 1 wk then 10 mg x 1 wk (started 05-24-15)    . warfarin (COUMADIN) 5 MG tablet Take 5-7.5 mg by mouth daily. Take 5 mg everyday.    Marland Kitchen anastrozole (ARIMIDEX) 1 MG tablet Take 1 tablet (1 mg total) by mouth daily. 90 tablet 3  . lisinopril (PRINIVIL,ZESTRIL) 40 MG tablet Take 40 mg by mouth every morning.     . nitroGLYCERIN (NITROSTAT) 0.4 MG  SL tablet Place 0.4 mg under the tongue every 5 (five) minutes as needed for chest pain.      No current facility-administered medications for this visit.    OBJECTIVE: Older white woman in no acute distress Filed Vitals:   06/20/15 1517  BP: 148/66  Pulse: 70  Temp: 98 F (36.7 C)  Resp: 18     Body mass index is 27.71 kg/(m^2).    ECOG FS:1 - Symptomatic but completely ambulatory   Skin: warm, dry  HEENT: sclerae anicteric, conjunctivae pink, oropharynx clear. No thrush or mucositis.  Lymph Nodes: No cervical or supraclavicular lymphadenopathy  Lungs: clear to auscultation bilaterally, no rales, wheezes, or rhonci  Heart: regular rate and rhythm  Abdomen: round, soft, non tender, positive bowel sounds  Musculoskeletal: No focal spinal tenderness, no peripheral edema, obvious gait disturbance to left side hip, using cane to ambulate  Neuro: non focal, well oriented, positive affect  Breasts: deferred  LAB RESULTS:  CMP     Component Value Date/Time   NA 131* 12/20/2014 0645   NA 138 06/17/2014 1531   K 4.3 12/20/2014 0645   K 3.9 06/17/2014 1531   CL 103 12/20/2014 0645   CO2 21 12/20/2014 0645   CO2 24 06/17/2014 1531   GLUCOSE 208* 12/20/2014 0645   GLUCOSE 143* 06/17/2014 1531   BUN 23 12/20/2014 0645   BUN 16.9 06/17/2014 1531   CREATININE 0.96 12/20/2014 0645   CREATININE 0.8 06/17/2014 1531   CREATININE 0.57 05/23/2011 1300   CALCIUM 8.7 12/20/2014 0645   CALCIUM 9.8 06/17/2014 1531   PROT 7.4 12/20/2014 0645   PROT 8.0 06/17/2014 1531   ALBUMIN 3.2* 12/20/2014 0645   ALBUMIN 3.5 06/17/2014 1531   AST 37 12/20/2014 0645   AST 43* 06/17/2014 1531   ALT 29 12/20/2014 0645   ALT 63* 06/17/2014 1531   ALKPHOS 120* 12/20/2014 0645   ALKPHOS 104 06/17/2014 1531   BILITOT 0.7 12/20/2014 0645   BILITOT 0.49 06/17/2014 1531   GFRNONAA 59* 12/20/2014  0645   GFRAA 68* 12/20/2014 0645    I No results found for: SPEP  Lab Results  Component Value Date    WBC 11.9* 12/20/2014   NEUTROABS 10.7* 12/20/2014   HGB 11.1* 12/20/2014   HCT 33.9* 12/20/2014   MCV 80.0 12/20/2014   PLT 195 12/20/2014      Chemistry      Component Value Date/Time   NA 131* 12/20/2014 0645   NA 138 06/17/2014 1531   K 4.3 12/20/2014 0645   K 3.9 06/17/2014 1531   CL 103 12/20/2014 0645   CO2 21 12/20/2014 0645   CO2 24 06/17/2014 1531   BUN 23 12/20/2014 0645   BUN 16.9 06/17/2014 1531   CREATININE 0.96 12/20/2014 0645   CREATININE 0.8 06/17/2014 1531   CREATININE 0.57 05/23/2011 1300      Component Value Date/Time   CALCIUM 8.7 12/20/2014 0645   CALCIUM 9.8 06/17/2014 1531   ALKPHOS 120* 12/20/2014 0645   ALKPHOS 104 06/17/2014 1531   AST 37 12/20/2014 0645   AST 43* 06/17/2014 1531   ALT 29 12/20/2014 0645   ALT 63* 06/17/2014 1531   BILITOT 0.7 12/20/2014 0645   BILITOT 0.49 06/17/2014 1531       No results found for: LABCA2  No components found for: LABCA125  No results for input(s): INR in the last 168 hours.  Urinalysis    Component Value Date/Time   COLORURINE AMBER* 12/20/2014 0839   APPEARANCEUR HAZY* 12/20/2014 0839   LABSPEC 1.022 12/20/2014 0839   PHURINE 6.5 12/20/2014 0839   GLUCOSEU NEGATIVE 12/20/2014 0839   GLUCOSEU NEGATIVE 04/05/2008 0833   HGBUR NEGATIVE 12/20/2014 0839   BILIRUBINUR NEGATIVE 12/20/2014 0839   KETONESUR NEGATIVE 12/20/2014 0839   PROTEINUR NEGATIVE 12/20/2014 0839   UROBILINOGEN 1.0 12/20/2014 0839   NITRITE NEGATIVE 12/20/2014 0839   LEUKOCYTESUR NEGATIVE 12/20/2014 0839    STUDIES: Bone density at [facility] 04/11/2015 showed a T score of -3.5 at the right femoral neck  Mammogram at [facility] on 04/11/2015 was unremarkable.   ASSESSMENT: 69 y.o. [place] woman status post right breast biopsy  01/28/2014 for a clinical T1b N0, stage IA invasive ductal carcinoma, grade 1, estrogen and progesterone receptor positive, HER-2 not amplified, with an MIB-1 of 26%  (1) Biopsy of 2 additional suspicious  areas in the right breast 02/03/2014 and 02/10/2014 showed only atypical ductal hyperplasia  (2) status post right lumpectomy and sentinel lymph node sampling 03/01/2014 showed an additional 3 mm area of invasive ductal carcinoma, grade 1, with negative margins. The single sentinel lymph node was negative.  (3) Opted against adjuvant radiation given the marginal benefit of local recurrence  (4) started anastrozole 03/29/2014, interrupted May 2016 with joint swelling; restarted 06/20/15  (a) osteoporosis with T score -3.5 on bone density scan 04/11/2015  PLAN: Melody Casey is seeing some improvement with her hand and wrist symptoms. She will go ahead and restart her anastrozole. I sent in a new prescription to her pharmacy. She will continue to wean off of the prednisone per her rheumatologist's instructions. She believes the next steps may be DMARDs but she is worried about the cost of these prescriptions.   I believe her left hip pain is a separate issue. She is already scheduled to see her PCP this Friday and they will discuss plans for imaging.   Melody Casey will return in 6 months for labs and a follow up visit. She understands and agrees with this plan. She knows the goal of treatment  in her case is cure. She has been encouraged to call with any issues that might arise before her next visit here.    Laurie Panda, NP   06/20/2015 3:51 PM   ADDENDUM: message left on patient's voicemail inquiring about a dentist visit. Patient has osteoporosis and would need to start bisphosphonate therapy after clearance from dentist.

## 2015-06-22 ENCOUNTER — Ambulatory Visit (INDEPENDENT_AMBULATORY_CARE_PROVIDER_SITE_OTHER): Payer: Medicare Other | Admitting: Cardiology

## 2015-06-22 ENCOUNTER — Ambulatory Visit (INDEPENDENT_AMBULATORY_CARE_PROVIDER_SITE_OTHER): Payer: Medicare Other | Admitting: Pharmacist Clinician (PhC)/ Clinical Pharmacy Specialist

## 2015-06-22 ENCOUNTER — Encounter: Payer: Self-pay | Admitting: Cardiology

## 2015-06-22 VITALS — BP 162/72 | Ht 67.0 in | Wt 182.0 lb

## 2015-06-22 DIAGNOSIS — I481 Persistent atrial fibrillation: Secondary | ICD-10-CM

## 2015-06-22 DIAGNOSIS — I2510 Atherosclerotic heart disease of native coronary artery without angina pectoris: Secondary | ICD-10-CM

## 2015-06-22 DIAGNOSIS — Z79899 Other long term (current) drug therapy: Secondary | ICD-10-CM

## 2015-06-22 DIAGNOSIS — I4819 Other persistent atrial fibrillation: Secondary | ICD-10-CM

## 2015-06-22 DIAGNOSIS — I4891 Unspecified atrial fibrillation: Secondary | ICD-10-CM

## 2015-06-22 LAB — POCT INR: INR: 2

## 2015-06-22 NOTE — Progress Notes (Signed)
HPI The patient presents for followup of atrial fib. She's been doing well with Tikosyn. The patient denies any new symptoms such as chest discomfort, neck or arm discomfort. There has been no new shortness of breath, PND or orthopnea. There have been no reported palpitations, presyncope or syncope.  With her usual activity she denies any symptoms.  She does have joint pains and she has been diagnosed with RA.    Allergies  Allergen Reactions  . Sulfa Antibiotics Rash    All over rash  . Acetaminophen Hives  . Banana Other (See Comments)    Unknown  . Oxycodone-Acetaminophen Itching  . Penicillins Other (See Comments)    Pt had so much as a child she's immune to it  . Latex Rash  . Miralax [Polyethylene Glycol] Swelling and Rash    Took [facility] brand developed rash. Patient states she tolerated name brand MiraLax in the past.  . Tape Rash    Current Outpatient Prescriptions  Medication Sig Dispense Refill  . ALPRAZolam (XANAX) 0.25 MG tablet Take 0.25 mg by mouth daily as needed for anxiety.     Marland Kitchen amLODipine (NORVASC) 2.5 MG tablet Take 1 tablet (2.5 mg total) by mouth every morning. 30 tablet 0  . anastrozole (ARIMIDEX) 1 MG tablet Take 1 tablet (1 mg total) by mouth daily. 90 tablet 3  . aspirin EC 81 MG tablet Take 81 mg by mouth daily.    Marland Kitchen atorvastatin (LIPITOR) 40 MG tablet TAKE ONE & ONE-HALF TABLETS BY MOUTH IN THE EVENING. 135 tablet 0  . calcium-vitamin D (OSCAL WITH D) 500-200 MG-UNIT per tablet Take 1 tablet by mouth 2 (two) times daily.    Marland Kitchen dofetilide (TIKOSYN) 250 MCG capsule Take 1 capsule (250 mcg total) by mouth 2 (two) times daily. 60 capsule 11  . ibuprofen (ADVIL,MOTRIN) 200 MG tablet Take 200-400 mg by mouth daily as needed. For pain    . lisinopril (PRINIVIL,ZESTRIL) 40 MG tablet Take 40 mg by mouth every morning.     . magnesium oxide (MAG-OX) 400 MG tablet Take 400 mg twice a day with food 60 tablet 6  . metFORMIN (GLUMETZA) 500 MG (MOD) 24 hr tablet Take 500  mg by mouth at bedtime.     . niacin (NIASPAN) 500 MG CR tablet Take 1 tablet (500 mg total) by mouth at bedtime. 60 tablet 3  . nitroGLYCERIN (NITROSTAT) 0.4 MG SL tablet Place 0.4 mg under the tongue every 5 (five) minutes as needed for chest pain.     . pantoprazole (PROTONIX) 40 MG tablet Take 40 mg by mouth daily.    . potassium chloride SA (K-DUR,KLOR-CON) 20 MEQ tablet Take 20 mEq by mouth 2 (two) times daily.      . predniSONE (DELTASONE) 10 MG tablet 30 mg x 1 wk, 20 mg x 1 wk, then 15 mg x 1 wk then 10 mg x 1 wk (started 05-24-15)    . warfarin (COUMADIN) 5 MG tablet Take 5-7.5 mg by mouth daily. Take 5 mg everyday.     No current facility-administered medications for this visit.    Past Medical History  Diagnosis Date  . HLD (hyperlipidemia)   . HTN (hypertension)   . Arthritis   . Foot deformity     right  . CAD (coronary artery disease)     a. NSTEMI 02/2011: occ mid Cx, DES to OM2, residual nonobst LAD dz.  . Tobacco abuse     stopped smoking 2012  .  Diabetes mellitus   . Atrial fibrillation     a. Dx 03/2011 - on tikosyn/coumadin.  . Anxiety   . Diverticulitis of colon 2008 and 04/2011  . Ureteral obstruction     History of gross hematuria/right hydronephrosis 2/2 to uteropelvic junction obstruction, s/p cystoscopy in January 2007 with bilateral retrograde pyelography, right ureter arthroscopy, right ureteral stent placement, bladder biopsies, stent removal since then.  . Heart attack   . Wears dentures     top  . Dysrhythmia     a-fib  . Atrial fibrillation   . Cancer     breast    Past Surgical History  Procedure Laterality Date  . Tonsillectomy and adenoidectomy    . Knee arthroscopy      left  . Tubal ligation    . Right leg surgery  age 31    As a child for  ?scleroderma per patient  . Ureteral surgery  2011    rt ureterostomy-stent  . Coronary stent placement  2012  . Cardiac catheterization  2012    stent  . Breast lumpectomy with needle  localization and axillary sentinel lymph node bx Right 03/01/2014    Procedure: BREAST LUMPECTOMY WITH NEEDLE LOCALIZATION AND AXILLARY SENTINEL LYMPH NODE BX;  Surgeon: Edward Jolly, MD;  Location: [place];  Service: General;  Laterality: Right;  . Colonoscopy with propofol N/A 06/29/2014    Procedure: COLONOSCOPY WITH PROPOFOL;  Surgeon: Garlan Fair, MD;  Location: [facility];  Service: Endoscopy;  Laterality: N/A;    ROS:  As stated in the HPI and negative for all other systems.  PHYSICAL EXAM BP 162/72 mmHg  Ht '5\' 7"'$  (1.702 m)  Wt 182 lb (82.555 kg)  BMI 28.50 kg/m2 GENERAL: Well appearing  NECK: No jugular venous distention, waveform within normal limits, carotid upstroke brisk and symmetric, no bruits, no thyromegaly   LUNGS: Clear to auscultation bilaterally  CHEST: Unremarkable  HEART: PMI not displaced or sustained,S1 and S2 within normal limits, no S3, no S4, no clicks, no rubs, no murmurs  ABD: Flat, positive bowel sounds normal in frequency in pitch, no bruits, no rebound, no guarding, no midline pulsatile mass, no hepatomegaly, no splenomegaly  EXT: 2 plus pulses throughout, no edema, no cyanosis no , 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 06/22/2015   ASSESSMENT AND PLAN  HYPERTENSION -  Her BP has been well controlled on Norvasc.   No change in therapy is indicated.   Atrial fibrillation -  She is maintaining sinus rhythm on Tikosyn.  Her QT is OK. I did reduce her dose to 250 mcg twice daily.  I will be checking a BMET and magnesium level today.   CAD - The patient has no new sypmtoms.  No further cardiovascular testing is indicated.  We will continue with aggressive risk reduction and meds as listed.

## 2015-06-22 NOTE — Patient Instructions (Signed)
Your physician wants you to follow-up in: 6 Months. You will receive a reminder letter in the mail two months in advance. If you don't receive a letter, please call our office to schedule the follow-up appointment.  Your physician recommends that you return for lab work BMP and Magnesium

## 2015-06-23 LAB — COMPLETE METABOLIC PANEL WITH GFR
ALBUMIN: 3.5 g/dL (ref 3.5–5.2)
ALT: 18 U/L (ref 0–35)
AST: 18 U/L (ref 0–37)
Alkaline Phosphatase: 90 U/L (ref 39–117)
BUN: 19 mg/dL (ref 6–23)
CHLORIDE: 100 meq/L (ref 96–112)
CO2: 23 meq/L (ref 19–32)
CREATININE: 0.75 mg/dL (ref 0.50–1.10)
Calcium: 9.1 mg/dL (ref 8.4–10.5)
GFR, Est Non African American: 80 mL/min
GLUCOSE: 122 mg/dL — AB (ref 70–99)
Potassium: 4.7 mEq/L (ref 3.5–5.3)
SODIUM: 132 meq/L — AB (ref 135–145)
Total Bilirubin: 0.4 mg/dL (ref 0.2–1.2)
Total Protein: 7 g/dL (ref 6.0–8.3)

## 2015-06-23 LAB — MAGNESIUM: Magnesium: 1.9 mg/dL (ref 1.5–2.5)

## 2015-06-24 DIAGNOSIS — M25552 Pain in left hip: Secondary | ICD-10-CM | POA: Diagnosis not present

## 2015-06-24 DIAGNOSIS — M79642 Pain in left hand: Secondary | ICD-10-CM | POA: Diagnosis not present

## 2015-06-24 DIAGNOSIS — I4891 Unspecified atrial fibrillation: Secondary | ICD-10-CM | POA: Diagnosis not present

## 2015-06-24 DIAGNOSIS — R399 Unspecified symptoms and signs involving the genitourinary system: Secondary | ICD-10-CM | POA: Diagnosis not present

## 2015-06-24 DIAGNOSIS — M79641 Pain in right hand: Secondary | ICD-10-CM | POA: Diagnosis not present

## 2015-06-27 DIAGNOSIS — M25431 Effusion, right wrist: Secondary | ICD-10-CM | POA: Diagnosis not present

## 2015-06-27 DIAGNOSIS — M545 Low back pain: Secondary | ICD-10-CM | POA: Diagnosis not present

## 2015-06-27 DIAGNOSIS — M810 Age-related osteoporosis without current pathological fracture: Secondary | ICD-10-CM | POA: Diagnosis not present

## 2015-06-27 DIAGNOSIS — M25432 Effusion, left wrist: Secondary | ICD-10-CM | POA: Diagnosis not present

## 2015-06-27 DIAGNOSIS — M25442 Effusion, left hand: Secondary | ICD-10-CM | POA: Diagnosis not present

## 2015-06-27 DIAGNOSIS — M25441 Effusion, right hand: Secondary | ICD-10-CM | POA: Diagnosis not present

## 2015-06-30 ENCOUNTER — Telehealth: Payer: Self-pay | Admitting: Cardiology

## 2015-06-30 ENCOUNTER — Other Ambulatory Visit: Payer: Self-pay

## 2015-06-30 DIAGNOSIS — I739 Peripheral vascular disease, unspecified: Secondary | ICD-10-CM | POA: Diagnosis not present

## 2015-06-30 DIAGNOSIS — E1151 Type 2 diabetes mellitus with diabetic peripheral angiopathy without gangrene: Secondary | ICD-10-CM | POA: Diagnosis not present

## 2015-06-30 DIAGNOSIS — L603 Nail dystrophy: Secondary | ICD-10-CM | POA: Diagnosis not present

## 2015-06-30 DIAGNOSIS — L84 Corns and callosities: Secondary | ICD-10-CM | POA: Diagnosis not present

## 2015-06-30 MED ORDER — NITROGLYCERIN 0.4 MG SL SUBL
0.4000 mg | SUBLINGUAL_TABLET | SUBLINGUAL | Status: DC | PRN
Start: 2015-06-30 — End: 2018-08-06

## 2015-06-30 NOTE — Telephone Encounter (Signed)
°  1. Which medications need to be refilled? Nitroglycerin  2. Which pharmacy is medication to be sent to?[facility]  3. Do they need a 30 day or 90 day supply? 1 bottle  4. Would they like a call back once the medication has been sent to the pharmacy? yes

## 2015-06-30 NOTE — Telephone Encounter (Signed)
Done. Patient informed

## 2015-07-04 ENCOUNTER — Other Ambulatory Visit: Payer: Self-pay | Admitting: Cardiology

## 2015-07-04 ENCOUNTER — Telehealth: Payer: Self-pay | Admitting: Pharmacist Clinician (PhC)/ Clinical Pharmacy Specialist

## 2015-07-04 NOTE — Telephone Encounter (Signed)
Called pt to request she increase magnesium to 2 tabs daily for 2 days then resume with 1 tab daily.  No answer, VM states name of Melody Casey.  Did not leave message

## 2015-07-04 NOTE — Telephone Encounter (Signed)
Pt returned call, verified #  Returned call LM to increase Mg x 2 days then resume previous dose.

## 2015-07-05 NOTE — Telephone Encounter (Signed)
Rx(s) sent to pharmacy electronically.  

## 2015-07-06 ENCOUNTER — Telehealth: Payer: Self-pay | Admitting: Pharmacist Clinician (PhC)/ Clinical Pharmacy Specialist

## 2015-07-06 NOTE — Telephone Encounter (Signed)
Patient returned call regarding increase in Magnesium dose.  We had asked that she increase dose to 400 mg bid x 2 days then resume with 1qd.   Has been taking 400 mg bid since starting.  Returned call, [facility] for patient to take 3 tablets (divided doses) for 2 days then resume her 400 mg bid.

## 2015-07-20 ENCOUNTER — Ambulatory Visit (INDEPENDENT_AMBULATORY_CARE_PROVIDER_SITE_OTHER): Payer: Medicare Other | Admitting: Pharmacist Clinician (PhC)/ Clinical Pharmacy Specialist

## 2015-07-20 ENCOUNTER — Telehealth: Payer: Self-pay | Admitting: Internal Medicine

## 2015-07-20 DIAGNOSIS — I481 Persistent atrial fibrillation: Secondary | ICD-10-CM

## 2015-07-20 DIAGNOSIS — Z79899 Other long term (current) drug therapy: Secondary | ICD-10-CM | POA: Diagnosis not present

## 2015-07-20 DIAGNOSIS — I4819 Other persistent atrial fibrillation: Secondary | ICD-10-CM

## 2015-07-20 LAB — POCT INR: INR: 2.1

## 2015-08-13 ENCOUNTER — Encounter (HOSPITAL_COMMUNITY): Payer: Self-pay

## 2015-08-13 ENCOUNTER — Emergency Department (HOSPITAL_COMMUNITY): Payer: Medicare Other

## 2015-08-13 ENCOUNTER — Other Ambulatory Visit: Payer: Self-pay

## 2015-08-13 ENCOUNTER — Encounter (HOSPITAL_COMMUNITY): Payer: Medicare Other

## 2015-08-13 ENCOUNTER — Inpatient Hospital Stay (HOSPITAL_COMMUNITY): Payer: Medicare Other

## 2015-08-13 ENCOUNTER — Ambulatory Visit (HOSPITAL_COMMUNITY): Admission: EM | Admit: 2015-08-13 | Payer: Medicare Other | Source: Home / Self Care | Admitting: Cardiovascular Disease

## 2015-08-13 ENCOUNTER — Encounter (HOSPITAL_COMMUNITY)
Admission: EM | Disposition: A | Discharge status: Home / Self Care | Payer: Self-pay | Source: Home / Self Care | Attending: Internal Medicine

## 2015-08-13 ENCOUNTER — Inpatient Hospital Stay (HOSPITAL_COMMUNITY)
Admission: EM | Admit: 2015-08-13 | Discharge: 2015-08-15 | DRG: 871 | Disposition: A | Discharge status: Home / Self Care | Payer: Medicare Other | Attending: Internal Medicine | Admitting: Internal Medicine

## 2015-08-13 DIAGNOSIS — R0600 Dyspnea, unspecified: Secondary | ICD-10-CM

## 2015-08-13 DIAGNOSIS — R0989 Other specified symptoms and signs involving the circulatory and respiratory systems: Secondary | ICD-10-CM | POA: Diagnosis not present

## 2015-08-13 DIAGNOSIS — I213 ST elevation (STEMI) myocardial infarction of unspecified site: Secondary | ICD-10-CM

## 2015-08-13 DIAGNOSIS — Z886 Allergy status to analgesic agent status: Secondary | ICD-10-CM

## 2015-08-13 DIAGNOSIS — F419 Anxiety disorder, unspecified: Secondary | ICD-10-CM | POA: Diagnosis present

## 2015-08-13 DIAGNOSIS — I4891 Unspecified atrial fibrillation: Secondary | ICD-10-CM | POA: Diagnosis not present

## 2015-08-13 DIAGNOSIS — Z794 Long term (current) use of insulin: Secondary | ICD-10-CM

## 2015-08-13 DIAGNOSIS — Z91040 Latex allergy status: Secondary | ICD-10-CM | POA: Diagnosis not present

## 2015-08-13 DIAGNOSIS — I10 Essential (primary) hypertension: Secondary | ICD-10-CM | POA: Diagnosis present

## 2015-08-13 DIAGNOSIS — R531 Weakness: Secondary | ICD-10-CM | POA: Diagnosis not present

## 2015-08-13 DIAGNOSIS — Z888 Allergy status to other drugs, medicaments and biological substances status: Secondary | ICD-10-CM | POA: Diagnosis not present

## 2015-08-13 DIAGNOSIS — Z7902 Long term (current) use of antithrombotics/antiplatelets: Secondary | ICD-10-CM | POA: Diagnosis not present

## 2015-08-13 DIAGNOSIS — Z7952 Long term (current) use of systemic steroids: Secondary | ICD-10-CM

## 2015-08-13 DIAGNOSIS — G458 Other transient cerebral ischemic attacks and related syndromes: Secondary | ICD-10-CM | POA: Diagnosis not present

## 2015-08-13 DIAGNOSIS — Z955 Presence of coronary angioplasty implant and graft: Secondary | ICD-10-CM

## 2015-08-13 DIAGNOSIS — I2510 Atherosclerotic heart disease of native coronary artery without angina pectoris: Secondary | ICD-10-CM | POA: Diagnosis present

## 2015-08-13 DIAGNOSIS — Z880 Allergy status to penicillin: Secondary | ICD-10-CM

## 2015-08-13 DIAGNOSIS — G459 Transient cerebral ischemic attack, unspecified: Secondary | ICD-10-CM

## 2015-08-13 DIAGNOSIS — Z885 Allergy status to narcotic agent status: Secondary | ICD-10-CM | POA: Diagnosis not present

## 2015-08-13 DIAGNOSIS — R0602 Shortness of breath: Secondary | ICD-10-CM

## 2015-08-13 DIAGNOSIS — Z87891 Personal history of nicotine dependence: Secondary | ICD-10-CM | POA: Diagnosis not present

## 2015-08-13 DIAGNOSIS — A4151 Sepsis due to Escherichia coli [E. coli]: Secondary | ICD-10-CM | POA: Diagnosis not present

## 2015-08-13 DIAGNOSIS — I252 Old myocardial infarction: Secondary | ICD-10-CM

## 2015-08-13 DIAGNOSIS — I482 Chronic atrial fibrillation: Secondary | ICD-10-CM | POA: Diagnosis present

## 2015-08-13 DIAGNOSIS — J810 Acute pulmonary edema: Secondary | ICD-10-CM | POA: Diagnosis present

## 2015-08-13 DIAGNOSIS — Z79899 Other long term (current) drug therapy: Secondary | ICD-10-CM

## 2015-08-13 DIAGNOSIS — Z7982 Long term (current) use of aspirin: Secondary | ICD-10-CM | POA: Diagnosis not present

## 2015-08-13 DIAGNOSIS — E119 Type 2 diabetes mellitus without complications: Secondary | ICD-10-CM | POA: Diagnosis present

## 2015-08-13 DIAGNOSIS — Z8673 Personal history of transient ischemic attack (TIA), and cerebral infarction without residual deficits: Secondary | ICD-10-CM

## 2015-08-13 DIAGNOSIS — J9601 Acute respiratory failure with hypoxia: Secondary | ICD-10-CM | POA: Diagnosis present

## 2015-08-13 DIAGNOSIS — E860 Dehydration: Secondary | ICD-10-CM

## 2015-08-13 DIAGNOSIS — N390 Urinary tract infection, site not specified: Secondary | ICD-10-CM | POA: Diagnosis present

## 2015-08-13 DIAGNOSIS — R319 Hematuria, unspecified: Secondary | ICD-10-CM

## 2015-08-13 DIAGNOSIS — A419 Sepsis, unspecified organism: Secondary | ICD-10-CM

## 2015-08-13 DIAGNOSIS — Z882 Allergy status to sulfonamides status: Secondary | ICD-10-CM | POA: Diagnosis not present

## 2015-08-13 DIAGNOSIS — R05 Cough: Secondary | ICD-10-CM | POA: Diagnosis not present

## 2015-08-13 DIAGNOSIS — E785 Hyperlipidemia, unspecified: Secondary | ICD-10-CM | POA: Diagnosis present

## 2015-08-13 DIAGNOSIS — IMO0001 Reserved for inherently not codable concepts without codable children: Secondary | ICD-10-CM | POA: Insufficient documentation

## 2015-08-13 DIAGNOSIS — J189 Pneumonia, unspecified organism: Secondary | ICD-10-CM | POA: Diagnosis not present

## 2015-08-13 DIAGNOSIS — R000 Tachycardia, unspecified: Secondary | ICD-10-CM | POA: Diagnosis not present

## 2015-08-13 LAB — CBC
HEMATOCRIT: 32.1 % — AB (ref 36.0–46.0)
HEMOGLOBIN: 10.1 g/dL — AB (ref 12.0–15.0)
MCH: 23.3 pg — ABNORMAL LOW (ref 26.0–34.0)
MCHC: 31.5 g/dL (ref 30.0–36.0)
MCV: 74.1 fL — AB (ref 78.0–100.0)
Platelets: 214 10*3/uL (ref 150–400)
RBC: 4.33 MIL/uL (ref 3.87–5.11)
RDW: 18.7 % — ABNORMAL HIGH (ref 11.5–15.5)
WBC: 14.3 10*3/uL — ABNORMAL HIGH (ref 4.0–10.5)

## 2015-08-13 LAB — DIFFERENTIAL
BASOS ABS: 0 10*3/uL (ref 0.0–0.1)
BASOS PCT: 0 % (ref 0–1)
EOS PCT: 0 % (ref 0–5)
Eosinophils Absolute: 0 10*3/uL (ref 0.0–0.7)
LYMPHS ABS: 1.4 10*3/uL (ref 0.7–4.0)
Lymphocytes Relative: 10 % — ABNORMAL LOW (ref 12–46)
MONOS PCT: 9 % (ref 3–12)
Monocytes Absolute: 1.3 10*3/uL — ABNORMAL HIGH (ref 0.1–1.0)
Neutro Abs: 11.6 10*3/uL — ABNORMAL HIGH (ref 1.7–7.7)
Neutrophils Relative %: 81 % — ABNORMAL HIGH (ref 43–77)

## 2015-08-13 LAB — URINE MICROSCOPIC-ADD ON

## 2015-08-13 LAB — GLUCOSE, CAPILLARY
Glucose-Capillary: 128 mg/dL — ABNORMAL HIGH (ref 65–99)
Glucose-Capillary: 156 mg/dL — ABNORMAL HIGH (ref 65–99)
Glucose-Capillary: 307 mg/dL — ABNORMAL HIGH (ref 65–99)
Glucose-Capillary: 267 mg/dL — ABNORMAL HIGH (ref 65–99)

## 2015-08-13 LAB — PROTIME-INR
INR: 2.23 — ABNORMAL HIGH (ref 0.00–1.49)
Prothrombin Time: 24.5 seconds — ABNORMAL HIGH (ref 11.6–15.2)

## 2015-08-13 LAB — COMPREHENSIVE METABOLIC PANEL
ALBUMIN: 3.1 g/dL — AB (ref 3.5–5.0)
ALK PHOS: 73 U/L (ref 38–126)
ALT: 19 U/L (ref 14–54)
ANION GAP: 9 (ref 5–15)
AST: 32 U/L (ref 15–41)
BILIRUBIN TOTAL: 0.8 mg/dL (ref 0.3–1.2)
BUN: 15 mg/dL (ref 6–20)
CALCIUM: 8.7 mg/dL — AB (ref 8.9–10.3)
CO2: 20 mmol/L — ABNORMAL LOW (ref 22–32)
Chloride: 104 mmol/L (ref 101–111)
Creatinine, Ser: 1.05 mg/dL — ABNORMAL HIGH (ref 0.44–1.00)
GFR calc Af Amer: >60 mL/min (ref >60)
GFR calc non Af Amer: 52 mL/min — ABNORMAL LOW (ref >60)
GLUCOSE: 199 mg/dL — AB (ref 65–99)
Potassium: 3.9 mmol/L (ref 3.5–5.1)
Sodium: 133 mmol/L — ABNORMAL LOW (ref 135–145)
TOTAL PROTEIN: 6.7 g/dL (ref 6.5–8.1)

## 2015-08-13 LAB — APTT: APTT: 32 s (ref 24–37)

## 2015-08-13 LAB — I-STAT TROPONIN, ED: Troponin i, poc: 0.08 ng/mL (ref 0.00–0.08)

## 2015-08-13 LAB — RAPID URINE DRUG SCREEN, HOSP PERFORMED
Amphetamines: NOT DETECTED
BARBITURATES: NOT DETECTED
Benzodiazepines: NOT DETECTED
Cocaine: NOT DETECTED
Opiates: NOT DETECTED
TETRAHYDROCANNABINOL: NOT DETECTED

## 2015-08-13 LAB — URINALYSIS, ROUTINE W REFLEX MICROSCOPIC
BILIRUBIN URINE: NEGATIVE
Glucose, UA: NEGATIVE mg/dL
KETONES UR: NEGATIVE mg/dL
NITRITE: POSITIVE — AB
Protein, ur: 100 mg/dL — AB
Specific Gravity, Urine: 1.013 (ref 1.005–1.030)
UROBILINOGEN UA: 0.2 mg/dL (ref 0.0–1.0)
pH: 6 (ref 5.0–8.0)

## 2015-08-13 LAB — LACTIC ACID, PLASMA: Lactic Acid, Venous: 2.1 mmol/L (ref 0.5–2.0)

## 2015-08-13 LAB — MRSA PCR SCREENING: MRSA by PCR: NEGATIVE

## 2015-08-13 LAB — BRAIN NATRIURETIC PEPTIDE: B NATRIURETIC PEPTIDE 5: 562.7 pg/mL — AB (ref 0.0–100.0)

## 2015-08-13 LAB — I-STAT CG4 LACTIC ACID, ED: LACTIC ACID, VENOUS: 2.14 mmol/L — AB (ref 0.5–2.0)

## 2015-08-13 LAB — ETHANOL: Alcohol, Ethyl (B): <5 mg/dL (ref <5)

## 2015-08-13 SURGERY — INVASIVE LAB ABORTED CASE

## 2015-08-13 MED ORDER — LEVALBUTEROL HCL 1.25 MG/0.5ML IN NEBU
1.2500 mg | INHALATION_SOLUTION | Freq: Once | RESPIRATORY_TRACT | Status: AC
  Administered 2015-08-15: 1.25 mg via RESPIRATORY_TRACT
  Filled 2015-08-13: qty 0.5

## 2015-08-13 MED ORDER — LEVALBUTEROL HCL 1.25 MG/0.5ML IN NEBU
INHALATION_SOLUTION | RESPIRATORY_TRACT | Status: AC
Start: 2015-08-13 — End: 2015-08-13
  Administered 2015-08-13: 06:00:00
  Filled 2015-08-13: qty 0.5

## 2015-08-13 MED ORDER — INSULIN ASPART 100 UNIT/ML ~~LOC~~ SOLN
0.0000 [IU] | Freq: Three times a day (TID) | SUBCUTANEOUS | Status: DC
  Administered 2015-08-13: 1 [IU] via SUBCUTANEOUS
  Administered 2015-08-13: 2 [IU] via SUBCUTANEOUS
  Administered 2015-08-13: 7 [IU] via SUBCUTANEOUS
  Administered 2015-08-14 (×2): 2 [IU] via SUBCUTANEOUS

## 2015-08-13 MED ORDER — METOPROLOL TARTRATE 1 MG/ML IV SOLN
INTRAVENOUS | Status: AC
  Administered 2015-08-13: 5 mg
  Filled 2015-08-13: qty 5

## 2015-08-13 MED ORDER — ACETAMINOPHEN 650 MG RE SUPP
650.0000 mg | RECTAL | Status: AC
  Administered 2015-08-13: 650 mg via RECTAL
  Filled 2015-08-13: qty 1

## 2015-08-13 MED ORDER — LEVOFLOXACIN IN D5W 500 MG/100ML IV SOLN: 500.0000 mg | INTRAVENOUS | Status: DC

## 2015-08-13 MED ORDER — LORAZEPAM 2 MG/ML IJ SOLN
INTRAMUSCULAR | Status: AC
  Administered 2015-08-13: 0.5 mg
  Filled 2015-08-13: qty 1

## 2015-08-13 MED ORDER — FUROSEMIDE 10 MG/ML IJ SOLN: 40.0000 mg | Freq: Once | INTRAMUSCULAR | Status: DC

## 2015-08-13 MED ORDER — LEVOFLOXACIN IN D5W 750 MG/150ML IV SOLN
750.0000 mg | Freq: Once | INTRAVENOUS | Status: AC
  Administered 2015-08-13: 750 mg via INTRAVENOUS
  Filled 2015-08-13: qty 150

## 2015-08-13 MED ORDER — FUROSEMIDE 10 MG/ML IJ SOLN
INTRAMUSCULAR | Status: AC
  Filled 2015-08-13: qty 4

## 2015-08-13 MED ORDER — DIPHENHYDRAMINE HCL 50 MG/ML IJ SOLN
INTRAMUSCULAR | Status: AC
  Filled 2015-08-13: qty 1

## 2015-08-13 MED ORDER — DEXTROSE 5 % IV SOLN
1.0000 g | INTRAVENOUS | Status: DC
  Administered 2015-08-13 – 2015-08-15 (×3): 1 g via INTRAVENOUS
  Filled 2015-08-13 (×3): qty 10

## 2015-08-13 MED ORDER — METHYLPREDNISOLONE SODIUM SUCC 125 MG IJ SOLR
125.0000 mg | Freq: Once | INTRAMUSCULAR | Status: AC
  Administered 2015-08-13: 125 mg via INTRAVENOUS

## 2015-08-13 MED ORDER — ASPIRIN EC 81 MG PO TBEC
81.0000 mg | DELAYED_RELEASE_TABLET | Freq: Every day | ORAL | Status: DC
  Administered 2015-08-13 – 2015-08-15 (×3): 81 mg via ORAL
  Filled 2015-08-13 (×3): qty 1

## 2015-08-13 MED ORDER — FUROSEMIDE 10 MG/ML IJ SOLN
20.0000 mg | Freq: Once | INTRAMUSCULAR | Status: AC
  Administered 2015-08-13: 20 mg via INTRAVENOUS

## 2015-08-13 MED ORDER — METOPROLOL TARTRATE 1 MG/ML IV SOLN
5.0000 mg | Freq: Once | INTRAVENOUS | Status: AC
  Administered 2015-08-13: 5 mg via INTRAVENOUS

## 2015-08-13 MED ORDER — NITROGLYCERIN 0.4 MG SL SUBL: 0.4000 mg | SUBLINGUAL_TABLET | SUBLINGUAL | Status: DC | PRN

## 2015-08-13 MED ORDER — RACEPINEPHRINE HCL 2.25 % IN NEBU
0.5000 mL | INHALATION_SOLUTION | Freq: Once | RESPIRATORY_TRACT | Status: AC
  Administered 2015-08-13: 0.5 mL via RESPIRATORY_TRACT
  Filled 2015-08-13: qty 0.5

## 2015-08-13 MED ORDER — ATORVASTATIN CALCIUM 40 MG PO TABS
40.0000 mg | ORAL_TABLET | Freq: Every day | ORAL | Status: DC
  Administered 2015-08-13 – 2015-08-14 (×2): 40 mg via ORAL
  Filled 2015-08-13 (×3): qty 1

## 2015-08-13 MED ORDER — SODIUM CHLORIDE 0.9 % IV SOLN
INTRAVENOUS | Status: DC | PRN
  Administered 2015-08-13: 1000 mL via INTRAVENOUS

## 2015-08-13 MED ORDER — DIPHENHYDRAMINE HCL 50 MG/ML IJ SOLN
25.0000 mg | Freq: Once | INTRAMUSCULAR | Status: AC
  Administered 2015-08-13: 25 mg via INTRAVENOUS

## 2015-08-13 MED ORDER — DIPHENHYDRAMINE HCL 50 MG/ML IJ SOLN
25.0000 mg | Freq: Once | INTRAMUSCULAR | Status: AC
Start: 2015-08-13 — End: 2015-08-13
  Administered 2015-08-13: 25 mg via INTRAVENOUS

## 2015-08-13 MED ORDER — WARFARIN - PHYSICIAN DOSING INPATIENT
Freq: Every day | Status: DC
  Administered 2015-08-13: 18:00:00

## 2015-08-13 MED ORDER — ALPRAZOLAM 0.25 MG PO TABS
0.2500 mg | ORAL_TABLET | Freq: Every day | ORAL | Status: DC | PRN
  Administered 2015-08-14: 0.25 mg via ORAL
  Filled 2015-08-13: qty 1

## 2015-08-13 MED ORDER — VANCOMYCIN HCL IN DEXTROSE 1-5 GM/200ML-% IV SOLN
1000.0000 mg | Freq: Once | INTRAVENOUS | Status: AC
  Administered 2015-08-13: 1000 mg via INTRAVENOUS
  Filled 2015-08-13: qty 200

## 2015-08-13 MED ORDER — SODIUM CHLORIDE 0.9 % IV BOLUS (SEPSIS)
500.0000 mL | INTRAVENOUS | Status: AC
  Administered 2015-08-13: 500 mL via INTRAVENOUS

## 2015-08-13 MED ORDER — METHYLPREDNISOLONE SODIUM SUCC 125 MG IJ SOLR
INTRAMUSCULAR | Status: AC
  Filled 2015-08-13: qty 2

## 2015-08-13 MED ORDER — WARFARIN SODIUM 5 MG PO TABS
5.0000 mg | ORAL_TABLET | Freq: Every day | ORAL | Status: DC
  Administered 2015-08-13: 5 mg via ORAL
  Filled 2015-08-13 (×2): qty 1

## 2015-08-13 MED ORDER — LORAZEPAM 2 MG/ML IJ SOLN
0.5000 mg | Freq: Once | INTRAMUSCULAR | Status: AC
  Administered 2015-08-13: 0.5 mg via INTRAVENOUS

## 2015-08-13 MED ORDER — SODIUM CHLORIDE 0.9 % IV SOLN
INTRAVENOUS | Status: DC
  Administered 2015-08-13 – 2015-08-14 (×5): via INTRAVENOUS

## 2015-08-13 MED ORDER — SODIUM CHLORIDE 0.9 % IV BOLUS (SEPSIS)
1000.0000 mL | INTRAVENOUS | Status: AC
  Administered 2015-08-13: 1000 mL via INTRAVENOUS

## 2015-08-13 NOTE — ED Notes (Signed)
Report to [facility] RN

## 2015-08-13 NOTE — ED Notes (Signed)
Code Sepsis called at [facility]

## 2015-08-13 NOTE — Progress Notes (Addendum)
Called per floor RN for Pt with stridor. Xopenex INH given. PT found restless in bed, follows commands. Audible stridor in upper airway, RR 30-35. Po2 95-100%. Red mottled rash assessed on chest, bilateral legs and back. Rectal Temp obtained yielding 103.1 HR 120s ST, BP initially 212/74 [facility] NP paged to bedside, INH Epi given per RT. Solumedrol and benadryl ordered and given STAT. PCCM consult requested per NP at 0620, PCCM unable to come right away to evaluate.  Lasix, lopressor and lorazapam given additionally.  As of 0645 Pt stridor resolved, rash improving rapidly.  Pt resting in bed, denies pain, with mild anxiety improving. HR 100s, RR 15, BP 119/72, PO2 100% on NRB. Pt for SDU bed, awaiting bed placement.

## 2015-08-13 NOTE — ED Notes (Signed)
Pt here for "stemi" by [facility], pt called ems for AMS, cbg 256, initial bp 70/43, pt was diaphoretic , and not feeling well per ems, afib rvr on their arrival. They gave fluid and asa 324 mg and now has no complaints, conversing with ease. nad noted,

## 2015-08-13 NOTE — Progress Notes (Signed)
Echocardiogram 2D Echocardiogram has been performed.  Melody Casey 08/13/2015, 10:09 AM

## 2015-08-13 NOTE — ED Notes (Signed)
Patient transported to CT 

## 2015-08-13 NOTE — ED Notes (Signed)
Phlebotomy called to come draw blood cultures and lactic acid.

## 2015-08-13 NOTE — Consult Note (Signed)
Name: Melody Casey MRN: [id number] DOB: Apr 15, 1946    ADMISSION DATE:  08/13/2015 CONSULTATION DATE:  9/3  REFERRING MD :  Olevia Bowens  CHIEF COMPLAINT:  Stridor and respiratory distress. Now resolved.   BRIEF PATIENT DESCRIPTION:   69 year old female initially admitted on 7/3 w/ working dx of UTI. Moved to the SDU after admission after acute onset of respiratory distress and HTN which was associated w/ an episode of chills and rigors. Treated w/ lasix, lopressor, but also solumedrol and racemic epi. On PCCM arrival pt resting comfortably, no resp distress or wheezing.   SIGNIFICANT EVENTS    STUDIES:     HISTORY OF PRESENT ILLNESS:    69 year old female with history of Afib, HTN, HLD, CAD s/p stent placement in 2012, MI in 2012, recurrent UTIs who was in her usual state of health until 7 pm on 9/2 when she started having heavy sweating. Other symptoms included aphasia and imbalance lasting 7-11 pm en route by EMS. She denied focal weakness, numbness or tingling. She denied vision or hearing changes. She had mild headaches. No facial droop. No chest pain, fever, chills, vomiting or abdominal pain. She has mild nausea and cloudy/smelly urine which happens each time she has a UTI. She was admitted w/ this working dx, started on IVFs and empiric Levaquin.   At a little before 0500 she began to have chills and rigors, this was associated w/ increased respiratory distress and eventually what was reported as loud upper airway stridor. She was found to have fever > 103, BP in 200s, and CXR was obtained. She was treated w/ lasix, lopressor, solumedrol and racemic epi then transferred to the SDU for closer monitoring. On PCCM arrival [facility] showing diffuse pulmonary edema. She is resting comfortably on facemask. BP improved. No distress. Absolutely now wheeze or stridor.   PAST MEDICAL HISTORY :   has a past medical history of HLD (hyperlipidemia); HTN (hypertension); Arthritis; Foot deformity; CAD  (coronary artery disease); Tobacco abuse; Diabetes mellitus; Atrial fibrillation; Anxiety; Diverticulitis of colon (2008 and 04/2011); Ureteral obstruction; Heart attack; Wears dentures; Dysrhythmia; Atrial fibrillation; and Cancer.  has past surgical history that includes Tonsillectomy and adenoidectomy; Knee arthroscopy; Tubal ligation; Right leg surgery (age 11); Ureteral surgery (2011); Coronary stent placement (2012); Cardiac catheterization (2012); Breast lumpectomy with needle localization and axillary sentinel lymph node bx (Right, 03/01/2014); and Colonoscopy with propofol (N/A, 06/29/2014). Prior to Admission medications   Medication Sig Start Date End Date Taking? Authorizing Provider  ALPRAZolam Duanne Moron) 0.25 MG tablet Take 0.25 mg by mouth daily as needed for anxiety.    Yes Historical Provider, MD  amLODipine (NORVASC) 2.5 MG tablet TAKE ONE TABLET BY MOUTH ONCE DAILY IN THE MORNING 07/05/15  Yes Minus Breeding, MD  anastrozole (ARIMIDEX) 1 MG tablet Take 1 tablet (1 mg total) by mouth daily. 06/20/15  Yes Laurie Panda, NP  aspirin EC 81 MG tablet Take 81 mg by mouth daily.   Yes Historical Provider, MD  atorvastatin (LIPITOR) 40 MG tablet TAKE ONE & ONE-HALF TABLETS BY MOUTH IN THE EVENING. 05/19/15  Yes Minus Breeding, MD  calcium-vitamin D (OSCAL WITH D) 500-200 MG-UNIT per tablet Take 1 tablet by mouth 2 (two) times daily.   Yes Historical Provider, MD  dofetilide (TIKOSYN) 250 MCG capsule Take 1 capsule (250 mcg total) by mouth 2 (two) times daily. 07/28/14  Yes Minus Breeding, MD  ibuprofen (ADVIL,MOTRIN) 200 MG tablet Take 200-400 mg by mouth daily as needed.  For pain   Yes Historical Provider, MD  lisinopril (PRINIVIL,ZESTRIL) 40 MG tablet Take 40 mg by mouth every morning.    Yes Historical Provider, MD  magnesium oxide (MAG-OX) 400 MG tablet Take 400 mg twice a day with food 08/17/14  Yes Minus Breeding, MD  metFORMIN (GLUMETZA) 500 MG (MOD) 24 hr tablet Take 500 mg by mouth at  bedtime.    Yes Historical Provider, MD  niacin (NIASPAN) 500 MG CR tablet Take 1 tablet (500 mg total) by mouth at bedtime. 08/11/14  Yes Minus Breeding, MD  nitroGLYCERIN (NITROSTAT) 0.4 MG SL tablet Place 1 tablet (0.4 mg total) under the tongue every 5 (five) minutes as needed for chest pain. 06/30/15  Yes Minus Breeding, MD  pantoprazole (PROTONIX) 40 MG tablet Take 40 mg by mouth daily.   Yes Historical Provider, MD  potassium chloride SA (K-DUR,KLOR-CON) 20 MEQ tablet Take 20 mEq by mouth 2 (two) times daily.     Yes Historical Provider, MD  warfarin (COUMADIN) 5 MG tablet Take 5 mg by mouth daily. Take 5 mg everyday.   Yes Historical Provider, MD   Allergies  Allergen Reactions  . Sulfa Antibiotics Rash    All over rash  . Acetaminophen Hives  . Banana Other (See Comments)    Unknown  . Oxycodone-Acetaminophen Itching  . Penicillins Other (See Comments)    Pt had so much as a child she's immune to it  . Latex Rash  . Miralax [Polyethylene Glycol] Swelling and Rash    Took [facility] brand developed rash. Patient states she tolerated name brand MiraLax in the past.  . Tape Rash    FAMILY HISTORY:  family history includes Cancer in her father; Diabetes in her brother; Heart disease in her mother; Heart failure (age of onset: 25) in her mother; Lung cancer (age of onset: 46) in her father. There is no history of Colon cancer or Liver disease. SOCIAL HISTORY:  reports that she quit smoking about 4 years ago. Her smoking use included Cigarettes. She has a 100 pack-year smoking history. She has never used smokeless tobacco. She reports that she does not drink alcohol or use illicit drugs.  REVIEW OF SYSTEMS:   Constitutional: marked fever, chills, and rigors. weight loss, + malaise/fatigue and diaphoresis.  HENT: Negative for hearing loss, ear pain, nosebleeds, congestion, sore throat, neck pain, tinnitus and ear discharge.   Eyes: Negative for blurred vision, double vision, photophobia, pain,  discharge and redness.  Respiratory: Negative for cough, hemoptysis, sputum production, had shortness of breath, and wheezing and possibly stridor--> this has improved. Now no dyspnea   Cardiovascular: Negative for chest pain, palpitations, orthopnea, claudication, leg swelling and PND.  Gastrointestinal: Negative for heartburn, nausea, vomiting, abdominal pain, diarrhea, constipation, blood in stool and melena.  Genitourinary: + dysuria, urgency, frequency, hematuria and flank pain.  Musculoskeletal: Negative for myalgias, back pain, joint pain and falls.  Skin: Negative for itching and rash.  Neurological: did have dizziness, tingling, tremors, sensory change, speech change, focal weakness, seizures, loss of consciousness, weakness and headaches.  Endo/Heme/Allergies: Negative for environmental allergies and polydipsia. Does not bruise/bleed easily.  SUBJECTIVE:  Feels better  VITAL SIGNS: Temp:  [97.8 F (36.6 C)-103.1 F (39.5 C)] 103.1 F (39.5 C) (09/03 6967) Pulse Rate:  [87-131] 101 (09/03 0659) Resp:  [15-32] 15 (09/03 0659) BP: (88-212)/(51-124) 119/72 mmHg (09/03 0659) SpO2:  [92 %-100 %] 98 % (09/03 0659) Weight:  [81.647 kg (180 lb)-85.2 kg (187 lb 13.3 oz)] 85.2  kg (187 lb 13.3 oz) (09/03 0510)  PHYSICAL EXAMINATION: General:  69 year old female resting comfortably in bed. Currently on 100% mask but sats 100% Neuro:  Awake, alert, no focal def  HEENT:  [place], no JVD upper airway is completely clear and w/ no stridor or upper airway wheezing  Cardiovascular:  rrr Lungs:  No accessory muscle use. Crackles posterior bases Abdomen:  Soft, non tender. No OM  Musculoskeletal:  Intact  Skin: flushed. No rash    Recent Labs Lab 08/13/15 0108  NA 133*  K 3.9  CL 104  CO2 20*  BUN 15  CREATININE 1.05*  GLUCOSE 199*    Recent Labs Lab 08/13/15 0108  HGB 10.1*  HCT 32.1*  WBC 14.3*  PLT 214    Recent Labs Lab 08/13/15 0108 08/13/15 0303  WBC 14.3*  --    LATICACIDVEN  --  2.14*   Dg Chest 1 View  08/13/2015   CLINICAL DATA:  Short of breath, pneumonia.  Subsequent encounter.  EXAM: CHEST  1 VIEW  COMPARISON:  Radiograph 08/13/2015  FINDINGS: Stable enlarged cardiac silhouette. There is interval increase in fine linear interstitial pattern. No pneumothorax. No focal consolidation.  IMPRESSION: Increased interstitial edema pattern.   Electronically Signed   By: Suzy Bouchard M.D.   On: 08/13/2015 07:09   Ct Head Wo Contrast  08/13/2015   CLINICAL DATA:  Acute onset of generalized weakness. Initial encounter.  EXAM: CT HEAD WITHOUT CONTRAST  TECHNIQUE: Contiguous axial images were obtained from the base of the skull through the vertex without intravenous contrast.  COMPARISON:  None.  FINDINGS: There is no evidence of acute infarction, mass lesion, or intra- or extra-axial hemorrhage on CT.  Prominence of the ventricles and sulci reflects mild to moderate cortical volume loss. Cerebellar atrophy is noted. Scattered periventricular and subcortical white matter change likely reflects small vessel ischemic microangiopathy.  The brainstem and fourth ventricle are within normal limits. The basal ganglia are unremarkable in appearance. The cerebral hemispheres demonstrate grossly normal gray-white differentiation. No mass effect or midline shift is seen.  There is no evidence of fracture; visualized osseous structures are unremarkable in appearance. The orbits are within normal limits. The paranasal sinuses and mastoid air cells are well-aerated. No significant soft tissue abnormalities are seen.  IMPRESSION: 1. No acute intracranial pathology seen on CT. 2. Mild to moderate cortical volume loss and scattered small vessel ischemic microangiopathy.   Electronically Signed   By: Garald Balding M.D.   On: 08/13/2015 02:35   Dg Chest Portable 1 View  08/13/2015   CLINICAL DATA:  Acute onset of generalized weakness and shortness of breath. Initial encounter.  EXAM:  PORTABLE CHEST - 1 VIEW  COMPARISON:  Chest radiograph performed 10/24/2012  FINDINGS: The lungs are well-aerated. Mild vascular congestion is noted. Mildly increased interstitial markings on the left side could reflect mild asymmetric interstitial edema or possibly pneumonia. There is no evidence of pleural effusion or pneumothorax.  The cardiomediastinal silhouette is within normal limits. No acute osseous abnormalities are seen.  IMPRESSION: Mild vascular congestion noted. Mildly increased interstitial markings on the left side could reflect mild asymmetric interstitial edema or possibly pneumonia.   Electronically Signed   By: Garald Balding M.D.   On: 08/13/2015 01:28    ASSESSMENT / PLAN:  Sepsis in setting of UTI w/ associated fever, chills and rigors.  Her initial lactic acid was > 2. Her f/u is still pending, although. Currently on exam she is  warm, w/ brisk CR, strong pulses, HR 84 and in no distress. She is on monotherapy w/ Levaquin but has seen quinolones a lot d/t recurrent infections and actually has Cipro at the house.  Plan Add rocephin, dc levaquin F/u lactate Ensure euvolemia   Acute respiratory distress in setting of Pulmonary edema. Suspect that she had fever-->rigors-->and then HTN w/ resultant flash edema. She has responded nicely to diuretics and lopressor. I did not appreciate any upper airway wheeze or stridor. I do not see that she was ever truly hypoxic. I wonder if this was simply r/t the distress and not true stridor.  Plan Wean O2 F/u PCXR Treat fevers w/ motrin  Agree w/ ECHO-->suspect some degree of diastolic dysfxn  H/o afib Plan Cont current rx including rate control and warfarin.  Will need close obs of INR w/ levaquin   DM Plan Cont ssi   All other issues per IM Nothing further from critical care stand-point to add. Will f/u on lactic acid. As long as it is clearing we will be available as needed.   Erick Colace ACNP-BC [place] Pager # [phone] OR # [phone] if no answer  Attending Note:  69 year old female with sepsis in the setting UTI.  Patient was wheezing.  There was a concern for stridor.  Patient became acutely hypertensive.  Patient was stabilized with diureses and control of anxiety.  On exam it is not stridor but is wheezing.  I reviewed CXR myself, no evidence of PNA.  Discussed with PCCM-NP and TRH-MD.  UTI:  - F/U on cultures.  - Continue abx as ordered, rocephin.  Sepsis:  - Check lactic acid.  - Abx.  - KVO IVF.  Acute respiratory failure, this is not stridor.  - Make racemic epi available.  - F/U CXR.  - Diureses as ordered.  - Titrate O2 for sat of 88-92%.  - Continue steroids.  - No need for ICU admission.  HTN: due to anxiety.  - Control anxiety.  - Would recommend holding anti-HTN medications.  A-fib:   - Rate control as ordered.  - Continue anti-coagulation.  DM:  - CBG.  - ISS.  PCCM will be available PRN.  Patient seen and examined, agree with above note.  I dictated the care and orders written for this patient under my direction.  Rush Farmer, MD [phone]  08/13/2015, 7:59 AM

## 2015-08-13 NOTE — Plan of Care (Signed)
Problem: Phase I Progression Outcomes Goal: Anticoagulation Therapy per MD order Outcome: Progressing Patient is on home dose coumadin. Therapeutic INR. Goal: Heart rate or rhythm control medication Outcome: Progressing Patient's heart rate is less than 120bpm. Patient in NSR/A-fib. Goal: Pain controlled with appropriate interventions Outcome: Progressing Patient pain goal of 0 on scale of 0-10. Patient has denied pain.

## 2015-08-13 NOTE — Progress Notes (Signed)
ANTIBIOTIC CONSULT NOTE - INITIAL  Pharmacy Consult for Rocephin  Indication: UTI  Allergies  Allergen Reactions  . Sulfa Antibiotics Rash    All over rash  . Acetaminophen Hives  . Banana Other (See Comments)    Unknown  . Oxycodone-Acetaminophen Itching  . Penicillins Other (See Comments)    Pt had so much as a child she's immune to it  . Latex Rash  . Miralax [Polyethylene Glycol] Swelling and Rash    Took [facility] brand developed rash. Patient states she tolerated name brand MiraLax in the past.  . Tape Rash    Patient Measurements: Height: '5\' 7"'$  (170.2 cm) Weight: 187 lb 13.3 oz (85.2 kg) IBW/kg (Calculated) : 61.6 Vital Signs: Temp: 103.1 F (39.5 C) (09/03 0613) Temp Source: Oral (09/03 5537) BP: 119/72 mmHg (09/03 0659) Pulse Rate: 101 (09/03 0659) Intake/Output from previous day: 09/02 0701 - 09/03 0700 In: 2700 [I.V.:2700] Out: 375 [Urine:375] Intake/Output from this shift:    Labs:  Recent Labs  08/13/15 0108  WBC 14.3*  HGB 10.1*  PLT 214  CREATININE 1.05*   Estimated Creatinine Clearance: 55.1 mL/min (by C-G formula based on Cr of 1.05). No results for input(s): VANCOTROUGH, VANCOPEAK, VANCORANDOM, GENTTROUGH, GENTPEAK, GENTRANDOM, TOBRATROUGH, TOBRAPEAK, TOBRARND, AMIKACINPEAK, AMIKACINTROU, AMIKACIN in the last 72 hours.   Microbiology: No results found for this or any previous visit (from the past 720 hour(s)).  Medical History: Past Medical History  Diagnosis Date  . HLD (hyperlipidemia)   . HTN (hypertension)   . Arthritis   . Foot deformity     right  . CAD (coronary artery disease)     a. NSTEMI 02/2011: occ mid Cx, DES to OM2, residual nonobst LAD dz.  . Tobacco abuse     stopped smoking 2012  . Diabetes mellitus   . Atrial fibrillation     a. Dx 03/2011 - on tikosyn/coumadin.  . Anxiety   . Diverticulitis of colon 2008 and 04/2011  . Ureteral obstruction     History of gross hematuria/right hydronephrosis 2/2 to uteropelvic  junction obstruction, s/p cystoscopy in January 2007 with bilateral retrograde pyelography, right ureter arthroscopy, right ureteral stent placement, bladder biopsies, stent removal since then.  . Heart attack   . Wears dentures     top  . Dysrhythmia     a-fib  . Atrial fibrillation   . Cancer     breast     Assessment: Patient was on monotherapy with Levaquin but has seen quinolones a lot d/t recurrent infections. Was on cipro PTA. Rocephin for UTI ordered, WBC 14.3.   Plan:  -Rocephin 1g Q24H -F/U urine culture, signs and symptoms of infection   Melburn Popper, PharmD Clinical Pharmacy Resident Pager: [phone] 08/13/2015 9:29 AM

## 2015-08-13 NOTE — Progress Notes (Signed)
ANTIBIOTIC CONSULT NOTE - INITIAL  Pharmacy Consult for Levaquin  Indication: UTI  Allergies  Allergen Reactions  . Sulfa Antibiotics Rash    All over rash  . Acetaminophen Hives  . Banana Other (See Comments)    Unknown  . Oxycodone-Acetaminophen Itching  . Penicillins Other (See Comments)    Pt had so much as a child she's immune to it  . Latex Rash  . Miralax [Polyethylene Glycol] Swelling and Rash    Took [facility] brand developed rash. Patient states she tolerated name brand MiraLax in the past.  . Tape Rash    Patient Measurements: Height: '5\' 7"'$  (170.2 cm) Weight: 187 lb 13.3 oz (85.2 kg) IBW/kg (Calculated) : 61.6 Vital Signs: Temp: 98.3 F (36.8 C) (09/03 0510) Temp Source: Oral (09/03 0510) BP: 130/96 mmHg (09/03 0510) Pulse Rate: 87 (09/03 0510) Intake/Output from previous day: 09/02 0701 - 09/03 0700 In: 2700 [I.V.:2700] Out: 200 [Urine:200] Intake/Output from this shift: Total I/O In: 2700 [I.V.:2700] Out: 200 [Urine:200]  Labs:  Recent Labs  08/13/15 0108  WBC 14.3*  HGB 10.1*  PLT 214  CREATININE 1.05*   Estimated Creatinine Clearance: 55.1 mL/min (by C-G formula based on Cr of 1.05). No results for input(s): VANCOTROUGH, VANCOPEAK, VANCORANDOM, GENTTROUGH, GENTPEAK, GENTRANDOM, TOBRATROUGH, TOBRAPEAK, TOBRARND, AMIKACINPEAK, AMIKACINTROU, AMIKACIN in the last 72 hours.   Microbiology: No results found for this or any previous visit (from the past 720 hour(s)).  Medical History: Past Medical History  Diagnosis Date  . HLD (hyperlipidemia)   . HTN (hypertension)   . Arthritis   . Foot deformity     right  . CAD (coronary artery disease)     a. NSTEMI 02/2011: occ mid Cx, DES to OM2, residual nonobst LAD dz.  . Tobacco abuse     stopped smoking 2012  . Diabetes mellitus   . Atrial fibrillation     a. Dx 03/2011 - on tikosyn/coumadin.  . Anxiety   . Diverticulitis of colon 2008 and 04/2011  . Ureteral obstruction     History of gross  hematuria/right hydronephrosis 2/2 to uteropelvic junction obstruction, s/p cystoscopy in January 2007 with bilateral retrograde pyelography, right ureter arthroscopy, right ureteral stent placement, bladder biopsies, stent removal since then.  . Heart attack   . Wears dentures     top  . Dysrhythmia     a-fib  . Atrial fibrillation   . Cancer     breast     Assessment: Levaquin for UTI, WBC 14.3, Scr good, other labs as above.   Plan:  -Levaquin 500 mg IV q24h -F/U urine culture -Watch INR on warfarin   Narda Bonds 08/13/2015,5:12 AM

## 2015-08-13 NOTE — Progress Notes (Signed)
Pt. Was asleep.  Chaplain will follow up

## 2015-08-13 NOTE — Progress Notes (Signed)
[facility] PROGRESS NOTE Interim History: 69 year old female with history of Afib, HTN, HLD, CAD s/p stent placement in 2012, MI in 2012, recurrent UTIs who was in her usual state of health until 7 pm today when she started having heavy sweating. Other symptoms included aphasia and imbalance lasting 7-11 pm en route by EMS.    Assessment/Plan: Sepsis due to   UTI (urinary tract infection): - She was started on the sepsis pathway, with a lactic acid greater than 2, she was started also on empiric antibiotics. I agree with changing it to Rocephin. - We'll cultures and urine cultures are pending. - Repeated lactic acid continues to be too loose but IV fluid hydration. - CCM was consulted due to acute respiratory failure.  Acute respiratory failure with hypoxia due to acute pulmonary edema: - Check a BNP and a 2-D echo. - Pulmonary and critical care on board appreciate assistance. - She was given IV Lopressor and Lasix, she still has JVD mild crackles on the right and left. But I cannot appreciate any stridor. - Continue to monitor strict I's and O's daily weights low sodium diet and fluid restriction.  TIA (transient ischemic attack): - Now resolved nonfocal her blood pressure stable. - Sepsis can mimic a TIA. - CT scan that showed no acute intracranial findings, carotid Doppler and MRI are pending. - Continue aspirin.  Chronic Atrial fibrillation - Initially she came in in atrial fibrillation with RVR which improve after fluid resuscitation. Cardiac troponins were negative. - She was started on IV metoprolol.     Code Status: full Family Communication: none  Disposition Plan: inpatient   Consultants:  PCCM  Procedures:  CT head  Echo  carotis  Antibiotics:  rocephin  HPI/Subjective: She relates her breathing is better  Objective: Filed Vitals:   08/13/15 0620 08/13/15 0634 08/13/15 0649 08/13/15 0659  BP: 198/102  148/60 119/72  Pulse:    101    Temp:      TempSrc:      Resp:    15  Height:      Weight:      SpO2:  100%  98%    Intake/Output Summary (Last 24 hours) at 08/13/15 1042 Last data filed at 08/13/15 0700  Gross per 24 hour  Intake   2700 ml  Output    375 ml  Net   2325 ml   Filed Weights   08/13/15 0112 08/13/15 0510  Weight: 81.647 kg (180 lb) 85.2 kg (187 lb 13.3 oz)    Exam:  General: Alert, awake, oriented x3, in no acute distress.  HEENT: No bruits, no goiter. +JVD Heart: Regular rate and rhythm. Lungs: Good air movement, crackles bilaterally more pronounced on the left and the right. Abdomen: Soft, nontender, nondistended, positive bowel sounds.  Neuro: Grossly intact, nonfocal.   Data Reviewed: Basic Metabolic Panel:  Recent Labs Lab 08/13/15 0108  NA 133*  K 3.9  CL 104  CO2 20*  GLUCOSE 199*  BUN 15  CREATININE 1.05*  CALCIUM 8.7*   Liver Function Tests:  Recent Labs Lab 08/13/15 0108  AST 32  ALT 19  ALKPHOS 73  BILITOT 0.8  PROT 6.7  ALBUMIN 3.1*   No results for input(s): LIPASE, AMYLASE in the last 168 hours. No results for input(s): AMMONIA in the last 168 hours. CBC:  Recent Labs Lab 08/13/15 0108  WBC 14.3*  NEUTROABS 11.6*  HGB 10.1*  HCT 32.1*  MCV 74.1*  PLT 214  Cardiac Enzymes: No results for input(s): CKTOTAL, CKMB, CKMBINDEX, TROPONINI in the last 168 hours. BNP (last 3 results) No results for input(s): BNP in the last 8760 hours.  ProBNP (last 3 results) No results for input(s): PROBNP in the last 8760 hours.  CBG:  Recent Labs Lab 08/13/15 0859  GLUCAP 128*    No results found for this or any previous visit (from the past 240 hour(s)).   Studies: Dg Chest 1 View  08/13/2015   CLINICAL DATA:  Short of breath, pneumonia.  Subsequent encounter.  EXAM: CHEST  1 VIEW  COMPARISON:  Radiograph 08/13/2015  FINDINGS: Stable enlarged cardiac silhouette. There is interval increase in fine linear interstitial pattern. No pneumothorax. No  focal consolidation.  IMPRESSION: Increased interstitial edema pattern.   Electronically Signed   By: Suzy Bouchard M.D.   On: 08/13/2015 07:09   Ct Head Wo Contrast  08/13/2015   CLINICAL DATA:  Acute onset of generalized weakness. Initial encounter.  EXAM: CT HEAD WITHOUT CONTRAST  TECHNIQUE: Contiguous axial images were obtained from the base of the skull through the vertex without intravenous contrast.  COMPARISON:  None.  FINDINGS: There is no evidence of acute infarction, mass lesion, or intra- or extra-axial hemorrhage on CT.  Prominence of the ventricles and sulci reflects mild to moderate cortical volume loss. Cerebellar atrophy is noted. Scattered periventricular and subcortical white matter change likely reflects small vessel ischemic microangiopathy.  The brainstem and fourth ventricle are within normal limits. The basal ganglia are unremarkable in appearance. The cerebral hemispheres demonstrate grossly normal gray-white differentiation. No mass effect or midline shift is seen.  There is no evidence of fracture; visualized osseous structures are unremarkable in appearance. The orbits are within normal limits. The paranasal sinuses and mastoid air cells are well-aerated. No significant soft tissue abnormalities are seen.  IMPRESSION: 1. No acute intracranial pathology seen on CT. 2. Mild to moderate cortical volume loss and scattered small vessel ischemic microangiopathy.   Electronically Signed   By: Garald Balding M.D.   On: 08/13/2015 02:35   Dg Chest Portable 1 View  08/13/2015   CLINICAL DATA:  Acute onset of generalized weakness and shortness of breath. Initial encounter.  EXAM: PORTABLE CHEST - 1 VIEW  COMPARISON:  Chest radiograph performed 10/24/2012  FINDINGS: The lungs are well-aerated. Mild vascular congestion is noted. Mildly increased interstitial markings on the left side could reflect mild asymmetric interstitial edema or possibly pneumonia. There is no evidence of pleural effusion  or pneumothorax.  The cardiomediastinal silhouette is within normal limits. No acute osseous abnormalities are seen.  IMPRESSION: Mild vascular congestion noted. Mildly increased interstitial markings on the left side could reflect mild asymmetric interstitial edema or possibly pneumonia.   Electronically Signed   By: Garald Balding M.D.   On: 08/13/2015 01:28    Scheduled Meds: . aspirin EC  81 mg Oral Daily  . atorvastatin  40 mg Oral q1800  . cefTRIAXone (ROCEPHIN)  IV  1 g Intravenous Q24H  . insulin aspart  0-9 Units Subcutaneous TID WC  . levalbuterol  1.25 mg Nebulization Once  . methylPREDNISolone sodium succinate      . warfarin  5 mg Oral q1800  . Warfarin - Physician Dosing Inpatient   Does not apply q1800   Continuous Infusions: . sodium chloride 100 mL/hr at 08/13/15 0915  . sodium chloride 100 mL/hr at 08/13/15 0454    Time Spent: 25 min   FELIZ Marguarite Arbour  [facility] Pager  [phone]. If 7PM-7AM, please contact night-coverage at [email or web address], password [facility] 08/13/2015, 10:42 AM  LOS: 0 days

## 2015-08-13 NOTE — ED Notes (Signed)
Admitting at the bedside.  

## 2015-08-13 NOTE — Progress Notes (Signed)
eLink Physician-Brief Progress Note Patient Name: Melody Casey DOB: [birth year] MRN: [id number]   Date of Service  08/13/2015  HPI/Events of Note  Asked by eRN to order lactic acid  eICU Interventions  Lactic acid ordered as part of sepsis bundele     Intervention Category Evaluation Type: Other  Bethsaida Siegenthaler 08/13/2015, 6:02 AM

## 2015-08-13 NOTE — Progress Notes (Signed)
Subjective/Objective Patient admitted to 2W14 around 5 am - began having wheezing, stridor not responsive to Xopenex, RR called and [place] provider called to bedside.  Physical exam: General: acute distress, on NRB, audible stridor. Lungs: clear Heart: Tachycardic - regular rhythm Skin: diffuse erythematous macular rash  Scheduled Meds: . acetaminophen  650 mg Rectal STAT  . aspirin EC  81 mg Oral Daily  . atorvastatin  40 mg Oral q1800  . insulin aspart  0-9 Units Subcutaneous TID WC  . levalbuterol      . levalbuterol  1.25 mg Nebulization Once  . [START ON 08/14/2015] levofloxacin (LEVAQUIN) IV  500 mg Intravenous Q24H  . methylPREDNISolone sodium succinate      . warfarin  5 mg Oral q1800  . Warfarin - Physician Dosing Inpatient   Does not apply q1800   Continuous Infusions: . sodium chloride 1,000 mL (08/13/15 0115)  . sodium chloride     PRN Meds:sodium chloride, ALPRAZolam, nitroGLYCERIN  Vital signs in last 24 hours: Temp:  [97.8 F (36.6 C)-99.7 F (37.6 C)] 98.3 F (36.8 C) (09/03 0510) Pulse Rate:  [87-131] 87 (09/03 0510) Resp:  [16-32] 20 (09/03 0510) BP: (88-130)/(51-96) 130/96 mmHg (09/03 0510) SpO2:  [92 %-100 %] 99 % (09/03 0510) Weight:  [81.647 kg (180 lb)-85.2 kg (187 lb 13.3 oz)] 85.2 kg (187 lb 13.3 oz) (09/03 0510)  Intake/Output last 3 shifts:   Intake/Output this shift: Total I/O In: 2700 [I.V.:2700] Out: 200 [Urine:200]  Problem Assessment/Plan  Stridor: likely due to drug reation. Given with Methylpred 125 mg IV, Benadryl 50 mg IV and Racemic epi nebulizer with only some improvement. CXR with evidence of vascular congestion - Lasix 40 mg IV given. Lopressor 5 mg IV given for elevated BP (SBP 200). Elink contacted and CCM to evaluate. Possible transfer to SDU or ICU.

## 2015-08-13 NOTE — ED Notes (Signed)
Pt has Curator with all pertinent medical information on bedside table.

## 2015-08-13 NOTE — ED Provider Notes (Signed)
CSN: [id number]     Arrival date & time 08/13/15  0104 History  This chart was scribed for Ripley Fraise, MD by Ludger Nutting, ED Scribe. This patient was seen in room [facility] and the patient's care was started 1:06 AM.    Chief Complaint  Patient presents with  . Code STEMI   The history is provided by the patient and the EMS personnel. No language interpreter was used.     HPI Comments: Melody Casey is a 69 y.o. female with past medical history of Afib, HLD, HTN, DM, CAD, MI who presents to the Emergency Department via ambulance complaining of generalized weakness with associated diaphoresis and lightheadedness that began a few hours ago. Patient states she was having difficulty with speech and communication for a few hours PTA. Patient states she was lying in bed when her symptoms began. She also reports mild HA, SOB, and abdominal pain which have since resolved. EMS reports family called for AMS and was in Afib with RVR on their arrival. She denies CP, fever, vomiting, LOC, unilateral weakness, dysuria. She reports a history of MI but denies prior CVA or TIA. She currently takes Warfarin.   Past Medical History  Diagnosis Date  . HLD (hyperlipidemia)   . HTN (hypertension)   . Arthritis   . Foot deformity     right  . CAD (coronary artery disease)     a. NSTEMI 02/2011: occ mid Cx, DES to OM2, residual nonobst LAD dz.  . Tobacco abuse     stopped smoking 2012  . Diabetes mellitus   . Atrial fibrillation     a. Dx 03/2011 - on tikosyn/coumadin.  . Anxiety   . Diverticulitis of colon 2008 and 04/2011  . Ureteral obstruction     History of gross hematuria/right hydronephrosis 2/2 to uteropelvic junction obstruction, s/p cystoscopy in January 2007 with bilateral retrograde pyelography, right ureter arthroscopy, right ureteral stent placement, bladder biopsies, stent removal since then.  . Heart attack   . Wears dentures     top  . Dysrhythmia     a-fib  . Atrial fibrillation   .  Cancer     breast   Past Surgical History  Procedure Laterality Date  . Tonsillectomy and adenoidectomy    . Knee arthroscopy      left  . Tubal ligation    . Right leg surgery  age 62    As a child for  ?scleroderma per patient  . Ureteral surgery  2011    rt ureterostomy-stent  . Coronary stent placement  2012  . Cardiac catheterization  2012    stent  . Breast lumpectomy with needle localization and axillary sentinel lymph node bx Right 03/01/2014    Procedure: BREAST LUMPECTOMY WITH NEEDLE LOCALIZATION AND AXILLARY SENTINEL LYMPH NODE BX;  Surgeon: Edward Jolly, MD;  Location: [facility];  Service: General;  Laterality: Right;  . Colonoscopy with propofol N/A 06/29/2014    Procedure: COLONOSCOPY WITH PROPOFOL;  Surgeon: Garlan Fair, MD;  Location: [facility];  Service: Endoscopy;  Laterality: N/A;   Family History  Problem Relation Age of Onset  . Colon cancer Neg Hx   . Liver disease Neg Hx   . Lung cancer Father 3    deceased  . Cancer Father   . Heart failure Mother 86    deceased  . Heart disease Mother   . Diabetes Brother    Social History  Substance Use Topics  .  Smoking status: Former Smoker -- 2.00 packs/day for 50 years    Types: Cigarettes    Quit date: 02/08/2011  . Smokeless tobacco: Never Used  . Alcohol Use: No   OB History    No data available     Review of Systems  Constitutional: Positive for diaphoresis. Negative for fever.  Respiratory: Positive for shortness of breath.   Cardiovascular: Negative for chest pain.  Gastrointestinal: Positive for abdominal pain. Negative for vomiting.  Genitourinary: Negative for dysuria.  Neurological: Positive for speech difficulty, weakness (generalized) and light-headedness. Negative for syncope and numbness.  All other systems reviewed and are negative.   Allergies  Sulfa antibiotics; Acetaminophen; Banana; Oxycodone-acetaminophen; Penicillins; Latex; Miralax; and  Tape  Home Medications   Prior to Admission medications   Medication Sig Start Date End Date Taking? Authorizing Provider  ALPRAZolam Duanne Moron) 0.25 MG tablet Take 0.25 mg by mouth daily as needed for anxiety.     Historical Provider, MD  amLODipine (NORVASC) 2.5 MG tablet TAKE ONE TABLET BY MOUTH ONCE DAILY IN THE MORNING 07/05/15   Minus Breeding, MD  anastrozole (ARIMIDEX) 1 MG tablet Take 1 tablet (1 mg total) by mouth daily. 06/20/15   Laurie Panda, NP  aspirin EC 81 MG tablet Take 81 mg by mouth daily.    Historical Provider, MD  atorvastatin (LIPITOR) 40 MG tablet TAKE ONE & ONE-HALF TABLETS BY MOUTH IN THE EVENING. 05/19/15   Minus Breeding, MD  calcium-vitamin D (OSCAL WITH D) 500-200 MG-UNIT per tablet Take 1 tablet by mouth 2 (two) times daily.    Historical Provider, MD  dofetilide (TIKOSYN) 250 MCG capsule Take 1 capsule (250 mcg total) by mouth 2 (two) times daily. 07/28/14   Minus Breeding, MD  ibuprofen (ADVIL,MOTRIN) 200 MG tablet Take 200-400 mg by mouth daily as needed. For pain    Historical Provider, MD  lisinopril (PRINIVIL,ZESTRIL) 40 MG tablet Take 40 mg by mouth every morning.     Historical Provider, MD  magnesium oxide (MAG-OX) 400 MG tablet Take 400 mg twice a day with food 08/17/14   Minus Breeding, MD  metFORMIN (GLUMETZA) 500 MG (MOD) 24 hr tablet Take 500 mg by mouth at bedtime.     Historical Provider, MD  niacin (NIASPAN) 500 MG CR tablet Take 1 tablet (500 mg total) by mouth at bedtime. 08/11/14   Minus Breeding, MD  nitroGLYCERIN (NITROSTAT) 0.4 MG SL tablet Place 1 tablet (0.4 mg total) under the tongue every 5 (five) minutes as needed for chest pain. 06/30/15   Minus Breeding, MD  pantoprazole (PROTONIX) 40 MG tablet Take 40 mg by mouth daily.    Historical Provider, MD  potassium chloride SA (K-DUR,KLOR-CON) 20 MEQ tablet Take 20 mEq by mouth 2 (two) times daily.      Historical Provider, MD  predniSONE (DELTASONE) 10 MG tablet 30 mg x 1 wk, 20 mg x 1 wk, then 15  mg x 1 wk then 10 mg x 1 wk (started 05-24-15) 05/24/15   Historical Provider, MD  warfarin (COUMADIN) 5 MG tablet Take 5-7.5 mg by mouth daily. Take 5 mg everyday.    Historical Provider, MD   BP 108/62 mmHg  Pulse 108  Temp(Src) 99.7 F (37.6 C) (Rectal)  Resp 18  Ht '5\' 7"'$  (1.702 m)  Wt 180 lb (81.647 kg)  BMI 28.19 kg/m2  SpO2 93% Physical Exam  Nursing note and vitals reviewed.  CONSTITUTIONAL: Well developed/well nourished HEAD: Normocephalic/atraumatic EYES: EOMI/PERRL ENMT: Mucous membranes moist NECK:  supple no meningeal signs SPINE/BACK:entire spine nontender CV: tachycardic, irregular  LUNGS: Lungs are clear to auscultation bilaterally, no apparent distress ABDOMEN: soft, nontender, no rebound or guarding, bowel sounds noted throughout abdomen GU:no cva tenderness NEURO: Pt is awake/alert/appropriate, moves all extremitiesx4.  No facial droop. No arm or leg drift. No dysarthria noted.   EXTREMITIES: pulses normal/equal, full ROM SKIN: warm, color normal PSYCH: no abnormalities of mood noted, alert and oriented to situation  ED Course  Procedures  CRITICAL CARE Performed by: Sharyon Cable Total critical care time: 31 Critical care time was exclusive of separately billable procedures and treating other patients. Critical care was necessary to treat or prevent imminent or life-threatening deterioration. Critical care was time spent personally by me on the following activities: development of treatment plan with patient and/or surrogate as well as nursing, discussions with consultants, evaluation of patient's response to treatment, examination of patient, obtaining history from patient or surrogate, ordering and performing treatments and interventions, ordering and review of laboratory studies, ordering and review of radiographic studies, pulse oximetry and re-evaluation of patient's condition.   DIAGNOSTIC STUDIES: Oxygen Saturation is 93% on RA, adequate by my  interpretation.    COORDINATION OF CARE: 1:16 AM Discussed treatment plan with pt at bedside and pt agreed to plan.  tPA in stroke considered but not given due to: Symptoms resolved Reviewed EKG with cardiology, no STEMI noted.  2:15 AM Pt now noted to have hypotension despite fluid bolus as well as tachycardia and leukocytosis Code sepsis called Orders entered 3:00 AM Patient rechecked and updated on lab results. Patient understands she will be hospitalized.  4:03 AM D/w dr Dreama Saa with [facility] Will admit to tele as vitals improving She denies dyspnea She has no crackles on exam after IV fluids  Labs Review Labs Reviewed  PROTIME-INR - Abnormal; Notable for the following:    Prothrombin Time 24.5 (*)    INR 2.23 (*)    All other components within normal limits  CBC - Abnormal; Notable for the following:    WBC 14.3 (*)    Hemoglobin 10.1 (*)    HCT 32.1 (*)    MCV 74.1 (*)    MCH 23.3 (*)    RDW 18.7 (*)    All other components within normal limits  DIFFERENTIAL - Abnormal; Notable for the following:    Neutrophils Relative % 81 (*)    Lymphocytes Relative 10 (*)    Neutro Abs 11.6 (*)    Monocytes Absolute 1.3 (*)    All other components within normal limits  COMPREHENSIVE METABOLIC PANEL - Abnormal; Notable for the following:    Sodium 133 (*)    CO2 20 (*)    Glucose, Bld 199 (*)    Creatinine, Ser 1.05 (*)    Calcium 8.7 (*)    Albumin 3.1 (*)    GFR calc non Af Amer 52 (*)    All other components within normal limits  I-STAT CG4 LACTIC ACID, ED - Abnormal; Notable for the following:    Lactic Acid, Venous 2.14 (*)    All other components within normal limits  CULTURE, BLOOD (ROUTINE X 2)  CULTURE, BLOOD (ROUTINE X 2)  URINE CULTURE  ETHANOL  APTT  URINE RAPID DRUG SCREEN, HOSP PERFORMED  URINALYSIS, ROUTINE W REFLEX MICROSCOPIC (NOT AT [facility] [place])  I-STAT TROPOININ, ED    Imaging Review Ct Head Wo Contrast  08/13/2015   CLINICAL DATA:  Acute onset of  generalized weakness. Initial encounter.  EXAM:  CT HEAD WITHOUT CONTRAST  TECHNIQUE: Contiguous axial images were obtained from the base of the skull through the vertex without intravenous contrast.  COMPARISON:  None.  FINDINGS: There is no evidence of acute infarction, mass lesion, or intra- or extra-axial hemorrhage on CT.  Prominence of the ventricles and sulci reflects mild to moderate cortical volume loss. Cerebellar atrophy is noted. Scattered periventricular and subcortical white matter change likely reflects small vessel ischemic microangiopathy.  The brainstem and fourth ventricle are within normal limits. The basal ganglia are unremarkable in appearance. The cerebral hemispheres demonstrate grossly normal gray-white differentiation. No mass effect or midline shift is seen.  There is no evidence of fracture; visualized osseous structures are unremarkable in appearance. The orbits are within normal limits. The paranasal sinuses and mastoid air cells are well-aerated. No significant soft tissue abnormalities are seen.  IMPRESSION: 1. No acute intracranial pathology seen on CT. 2. Mild to moderate cortical volume loss and scattered small vessel ischemic microangiopathy.   Electronically Signed   By: Garald Balding M.D.   On: 08/13/2015 02:35   Dg Chest Portable 1 View  08/13/2015   CLINICAL DATA:  Acute onset of generalized weakness and shortness of breath. Initial encounter.  EXAM: PORTABLE CHEST - 1 VIEW  COMPARISON:  Chest radiograph performed 10/24/2012  FINDINGS: The lungs are well-aerated. Mild vascular congestion is noted. Mildly increased interstitial markings on the left side could reflect mild asymmetric interstitial edema or possibly pneumonia. There is no evidence of pleural effusion or pneumothorax.  The cardiomediastinal silhouette is within normal limits. No acute osseous abnormalities are seen.  IMPRESSION: Mild vascular congestion noted. Mildly increased interstitial markings on the left  side could reflect mild asymmetric interstitial edema or possibly pneumonia.   Electronically Signed   By: Garald Balding M.D.   On: 08/13/2015 01:28   I have personally reviewed and evaluated these images and lab results as part of my medical decision-making.   EKG Interpretation   Date/Time:  Saturday August 13 2015 01:11:29 EDT Ventricular Rate:  127 PR Interval:    QRS Duration: 89 QT Interval:  323 QTC Calculation: 469 R Axis:   64 Text Interpretation:  Atrial fibrillation Ventricular premature complex  Repol abnrm suggests ischemia, diffuse leads changed from prior Confirmed  by Christy Gentles  MD, Aadan Chenier ([id number]) on 08/13/2015 1:21:40 AM     Medications  sodium chloride 0.9 % bolus 1,000 mL (1,000 mLs Intravenous New Bag/Given 08/13/15 0240)    Followed by  sodium chloride 0.9 % bolus 500 mL (500 mLs Intravenous New Bag/Given 08/13/15 0327)  levofloxacin (LEVAQUIN) IVPB 750 mg (not administered)  vancomycin (VANCOCIN) IVPB 1000 mg/200 mL premix (1,000 mg Intravenous New Bag/Given 08/13/15 0327)  0.9 %  sodium chloride infusion (1,000 mLs Intravenous New Bag/Given 08/13/15 0115)    MDM   Final diagnoses:  Atrial fibrillation with rapid ventricular response  Dehydration  Sepsis, due to unspecified organism    Nursing notes including past medical history and social history reviewed and considered in documentation xrays/imaging reviewed by myself and considered during evaluation Labs/vital reviewed myself and considered during evaluation .  I personally performed the services described in this documentation, which was scribed in my presence. The recorded information has been reviewed and is accurate.       Ripley Fraise, MD 08/13/15 [phone]

## 2015-08-13 NOTE — Progress Notes (Signed)
PATIENT ARRIVED TO UNIT 2W FROM E.D. VIA STRETCHER. AMBULATED TO BATHROOM THEN BED. WEIGHT AND VITALS OBTAINED. TELE APPLIED. ASSESSMENT PERFORMED.  PATIENT EDUCATED ON EQUIPMENT AND SAFETY. INSTRUCTED TO CALL FOR ASSISTANCE WHEN NEEDED.

## 2015-08-13 NOTE — Progress Notes (Signed)
PATIENT TRANSFERRED TO UNIT 3S (FROM 2W) VIA BED. NON-REBREATHER, TELE,  AND ZOLL IN PLACE.

## 2015-08-13 NOTE — Progress Notes (Addendum)
Chaplain responded to Code STEMI. Upon arrival, chaplain asked patient if she wanted anyone to be contacted.  Patient did not appear anxious and she reported that she was not feeling any pain.  She indicated that her husband had recently been discharged from a 9-day hospitalization for a spinal fusion and that he had a laryngectomy. Tel:  [phone]. She asked chaplain to contact him to say she was at [facility] and she "was not in pain".  She does not expect him to come. Chaplain left message for husband and followed up with a conversation with patient.  She is overwhelmed by her husband's medical needs which will require months of rehabilitation.  They have family in [place] and [place] but not here.  Her immediate concern is to get someone to help husband.  Will ask a chaplain to check in with patient tomorrow if she is admitted to assist in phone calls and supporting her planning.    Luana Shu  [phone]   08/13/15 0100  Clinical Encounter Type  Visited With Patient  Visit Type Initial  Referral From Other (Comment) (STEMI)  Consult/Referral To Chaplain  Stress Factors  Patient Stress Factors Health changes

## 2015-08-13 NOTE — Progress Notes (Signed)
REPORT CALLED TO UNIT 3S

## 2015-08-13 NOTE — H&P (Signed)
History and Physical  MLISS WEDIN WPY:[id number] DOB: [birth year] DOA: 08/13/2015  PCP: Reita Cliche, MD   Chief Complaint: Aphasia   History of Present Illness:  Patient is a 69 year old female with history of Afib, HTN, HLD, CAD s/p stent placement in 2012, MI in 2012, recurrent UTIs who was in her usual state of health until 7 pm today when she started having heavy sweating. Other symptoms included aphasia and imbalance lasting 7-11 pm en route by EMS. She denied focal weakness, numbness or tingling. She denied vision or hearing changes. She had mild headaches. No facial droop. No chest pain, fever, chills, vomiting or abdominal pain. She has mild nausea and cloudy/smelly urine which happens each time she has a UTI.   Review of Systems:  CONSTITUTIONAL:   sweats.  No fatigue, malaise, lethargy.  No fever or chills. Eyes:  No visual changes.  No eye pain.  No eye discharge.   ENT:    No epistaxis.  No sinus pain.  No sore throat.  No ear pain.  No congestion. RESPIRATORY:  No cough.  No wheeze.  No hemoptysis.  No shortness of breath. CARDIOVASCULAR:  No chest pains.  No palpitations. GASTROINTESTINAL:  No abdominal pain.  nausea . No vomiting.  No diarrhea or constipation.  No hematemesis.  No hematochezia.  No melena. GENITOURINARY:  No urgency.  No frequency.  No dysuria.  No hematuria.  No obstructive symptoms.  No discharge.  No pain.  No significant abnormal bleeding. MUSCULOSKELETAL:  No musculoskeletal pain.  No joint swelling.  No arthritis. NEUROLOGICAL:  No confusion.  No weakness. headache. No seizure. PSYCHIATRIC:  No depression. No anxiety. No suicidal ideation. SKIN:  No rashes.  No lesions.  No wounds. ENDOCRINE:  No unexplained weight loss.  No polydipsia.  No polyuria.  No polyphagia. HEMATOLOGIC:  No anemia.  No purpura.  No petechiae.  No bleeding.  ALLERGIC AND IMMUNOLOGIC:  No pruritus.  No swelling Other:  Past Medical and Surgical History:   Past  Medical History  Diagnosis Date  . HLD (hyperlipidemia)   . HTN (hypertension)   . Arthritis   . Foot deformity     right  . CAD (coronary artery disease)     a. NSTEMI 02/2011: occ mid Cx, DES to OM2, residual nonobst LAD dz.  . Tobacco abuse     stopped smoking 2012  . Diabetes mellitus   . Atrial fibrillation     a. Dx 03/2011 - on tikosyn/coumadin.  . Anxiety   . Diverticulitis of colon 2008 and 04/2011  . Ureteral obstruction     History of gross hematuria/right hydronephrosis 2/2 to uteropelvic junction obstruction, s/p cystoscopy in January 2007 with bilateral retrograde pyelography, right ureter arthroscopy, right ureteral stent placement, bladder biopsies, stent removal since then.  . Heart attack   . Wears dentures     top  . Dysrhythmia     a-fib  . Atrial fibrillation   . Cancer     breast   Past Surgical History  Procedure Laterality Date  . Tonsillectomy and adenoidectomy    . Knee arthroscopy      left  . Tubal ligation    . Right leg surgery  age 53    As a child for  ?scleroderma per patient  . Ureteral surgery  2011    rt ureterostomy-stent  . Coronary stent placement  2012  . Cardiac catheterization  2012    stent  . Breast lumpectomy  with needle localization and axillary sentinel lymph node bx Right 03/01/2014    Procedure: BREAST LUMPECTOMY WITH NEEDLE LOCALIZATION AND AXILLARY SENTINEL LYMPH NODE BX;  Surgeon: Edward Jolly, MD;  Location: [address];  Service: General;  Laterality: Right;  . Colonoscopy with propofol N/A 06/29/2014    Procedure: COLONOSCOPY WITH PROPOFOL;  Surgeon: Garlan Fair, MD;  Location: [facility];  Service: Endoscopy;  Laterality: N/A;    Social History:   reports that she quit smoking about 4 years ago. Her smoking use included Cigarettes. She has a 100 pack-year smoking history. She has never used smokeless tobacco. She reports that she does not drink alcohol or use illicit drugs.   Allergies    Allergen Reactions  . Sulfa Antibiotics Rash    All over rash  . Acetaminophen Hives  . Banana Other (See Comments)    Unknown  . Oxycodone-Acetaminophen Itching  . Penicillins Other (See Comments)    Pt had so much as a child she's immune to it  . Latex Rash  . Miralax [Polyethylene Glycol] Swelling and Rash    Took [employer] brand developed rash. Patient states she tolerated name brand MiraLax in the past.  . Tape Rash    Family History  Problem Relation Age of Onset  . Colon cancer Neg Hx   . Liver disease Neg Hx   . Lung cancer Father 45    deceased  . Cancer Father   . Heart failure Mother 61    deceased  . Heart disease Mother   . Diabetes Brother       Prior to Admission medications   Medication Sig Start Date End Date Taking? Authorizing Provider  ALPRAZolam Duanne Moron) 0.25 MG tablet Take 0.25 mg by mouth daily as needed for anxiety.    Yes Historical Provider, MD  amLODipine (NORVASC) 2.5 MG tablet TAKE ONE TABLET BY MOUTH ONCE DAILY IN THE MORNING 07/05/15  Yes Minus Breeding, MD  anastrozole (ARIMIDEX) 1 MG tablet Take 1 tablet (1 mg total) by mouth daily. 06/20/15  Yes Laurie Panda, NP  aspirin EC 81 MG tablet Take 81 mg by mouth daily.   Yes Historical Provider, MD  atorvastatin (LIPITOR) 40 MG tablet TAKE ONE & ONE-HALF TABLETS BY MOUTH IN THE EVENING. 05/19/15  Yes Minus Breeding, MD  calcium-vitamin D (OSCAL WITH D) 500-200 MG-UNIT per tablet Take 1 tablet by mouth 2 (two) times daily.   Yes Historical Provider, MD  dofetilide (TIKOSYN) 250 MCG capsule Take 1 capsule (250 mcg total) by mouth 2 (two) times daily. 07/28/14  Yes Minus Breeding, MD  ibuprofen (ADVIL,MOTRIN) 200 MG tablet Take 200-400 mg by mouth daily as needed. For pain   Yes Historical Provider, MD  lisinopril (PRINIVIL,ZESTRIL) 40 MG tablet Take 40 mg by mouth every morning.    Yes Historical Provider, MD  magnesium oxide (MAG-OX) 400 MG tablet Take 400 mg twice a day with food 08/17/14  Yes Minus Breeding, MD  metFORMIN (GLUMETZA) 500 MG (MOD) 24 hr tablet Take 500 mg by mouth at bedtime.    Yes Historical Provider, MD  niacin (NIASPAN) 500 MG CR tablet Take 1 tablet (500 mg total) by mouth at bedtime. 08/11/14  Yes Minus Breeding, MD  nitroGLYCERIN (NITROSTAT) 0.4 MG SL tablet Place 1 tablet (0.4 mg total) under the tongue every 5 (five) minutes as needed for chest pain. 06/30/15  Yes Minus Breeding, MD  pantoprazole (PROTONIX) 40 MG tablet Take 40 mg by mouth  daily.   Yes Historical Provider, MD  potassium chloride SA (K-DUR,KLOR-CON) 20 MEQ tablet Take 20 mEq by mouth 2 (two) times daily.     Yes Historical Provider, MD  warfarin (COUMADIN) 5 MG tablet Take 5 mg by mouth daily. Take 5 mg everyday.   Yes Historical Provider, MD    Physical Exam: BP 108/62 mmHg  Pulse 108  Temp(Src) 99.7 F (37.6 C) (Rectal)  Resp 18  Ht '5\' 7"'$  (1.702 m)  Wt 81.647 kg (180 lb)  BMI 28.19 kg/m2  SpO2 93%  GENERAL : Well developed, well nourished, alert and cooperative, and appears to be in no acute distress. HEAD: normocephalic. EYES: PERRL, EOMI.  vision is grossly intact. EARS: External auditory canals and tympanic membranes clear, hearing grossly intact. NOSE: No nasal discharge. THROAT: Oral cavity and pharynx normal. No inflammation, swelling, exudate, or lesions.  NECK: Neck supple, non-tender y. CARDIAC: Normal S1 and S2. No S3, S4 or murmurs. Rhythm is irregular. There is no peripheral edema, cyanosis or pallor. Extremities are warm and well perfused.  LUNGS: Clear to auscultation and percussion without rales, rhonchi, wheezing or diminished breath sounds. ABDOMEN: Positive bowel sounds. Soft, nondistended, nontender. No guarding or rebound. No masses. 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  NEUROLOGICAL: The mental examination revealed the patient was oriented to person, place, and time.CN II-XII intact. Strength and sensation symmetric and intact  throughout.  SKIN: Skin normal color, texture and turgor with no lesions or eruptions. PSYCHIATRIC:  The patient was able to demonstrate good judgement and reason, without hallucinations, abnormal affect or abnormal behaviors during the examination. Patient is not suicidal.          Labs on Admission:  Reviewed.   Radiological Exams on Admission: Ct Head Wo Contrast  08/13/2015   CLINICAL DATA:  Acute onset of generalized weakness. Initial encounter.  EXAM: CT HEAD WITHOUT CONTRAST  TECHNIQUE: Contiguous axial images were obtained from the base of the skull through the vertex without intravenous contrast.  COMPARISON:  None.  FINDINGS: There is no evidence of acute infarction, mass lesion, or intra- or extra-axial hemorrhage on CT.  Prominence of the ventricles and sulci reflects mild to moderate cortical volume loss. Cerebellar atrophy is noted. Scattered periventricular and subcortical white matter change likely reflects small vessel ischemic microangiopathy.  The brainstem and fourth ventricle are within normal limits. The basal ganglia are unremarkable in appearance. The cerebral hemispheres demonstrate grossly normal gray-white differentiation. No mass effect or midline shift is seen.  There is no evidence of fracture; visualized osseous structures are unremarkable in appearance. The orbits are within normal limits. The paranasal sinuses and mastoid air cells are well-aerated. No significant soft tissue abnormalities are seen.  IMPRESSION: 1. No acute intracranial pathology seen on CT. 2. Mild to moderate cortical volume loss and scattered small vessel ischemic microangiopathy.   Electronically Signed   By: Garald Balding M.D.   On: 08/13/2015 02:35   Dg Chest Portable 1 View  08/13/2015   CLINICAL DATA:  Acute onset of generalized weakness and shortness of breath. Initial encounter.  EXAM: PORTABLE CHEST - 1 VIEW  COMPARISON:  Chest radiograph performed 10/24/2012  FINDINGS: The lungs are  well-aerated. Mild vascular congestion is noted. Mildly increased interstitial markings on the left side could reflect mild asymmetric interstitial edema or possibly pneumonia. There is no evidence of pleural effusion or pneumothorax.  The cardiomediastinal silhouette is within normal limits. No acute osseous abnormalities are seen.  IMPRESSION: Mild vascular congestion noted. Mildly increased interstitial markings on the left side could reflect mild asymmetric interstitial edema or possibly pneumonia.   Electronically Signed   By: Garald Balding M.D.   On: 08/13/2015 01:28    EKG:  Independently reviewed. Afib  Assessment/Plan  Sepsis:  Likely due to UTI  CXR with possible PNA but no respiratory symptoms. Bcx sent  Started on levofloxacin IV Replaced 2 L NS In the ER. Will continue IVF at 100 cc/hr.  Admit to Tele bed LA 2.1   TIA:  Likely due to sepsis and hypotension  BP on EMS arrival was 18-48T systolic Will check Echo and carotid dupleux Neuro consult in AM CT head neg for acute changes: chronic vascular disease.  Continue asp and statin  Ambulating : low suspicion for DVT Coumadin therapeutic INR 2-3  Afib with RVR:  Improved after fluid resuscitation  EKG without specific T/ST changes Initial Trop was neg. No chest pain Repeat trop in am.   HTN: hold meds for now  HLD: continue Lipitor  CAD :  S/p stent in 2012 Continue asp and statin   DM:  Hold metformin, placed on low dose insulin correction   DVT prophylaxis: on full dose AC GI prophylaxis: continue PPI Consultants: Neuro  Code Status: Full  Family Communication: Lives with husband who was discharged recently after spinal fusion and no one to take care of him. Will need CM/SW consult.  Disposition Plan: Admit to Tele awaiting Ucx result and Neuro eval for possible TIA.     Gennaro Africa M.D [facility]

## 2015-08-14 ENCOUNTER — Inpatient Hospital Stay (HOSPITAL_COMMUNITY): Payer: Medicare Other

## 2015-08-14 DIAGNOSIS — G459 Transient cerebral ischemic attack, unspecified: Secondary | ICD-10-CM

## 2015-08-14 LAB — BASIC METABOLIC PANEL
ANION GAP: 5 (ref 5–15)
BUN: 18 mg/dL (ref 6–20)
CALCIUM: 8.5 mg/dL — AB (ref 8.9–10.3)
CO2: 21 mmol/L — AB (ref 22–32)
Chloride: 112 mmol/L — ABNORMAL HIGH (ref 101–111)
Creatinine, Ser: 0.95 mg/dL (ref 0.44–1.00)
GFR, EST NON AFRICAN AMERICAN: 59 mL/min — AB (ref >60)
GLUCOSE: 147 mg/dL — AB (ref 65–99)
POTASSIUM: 3.5 mmol/L (ref 3.5–5.1)
Sodium: 138 mmol/L (ref 135–145)

## 2015-08-14 LAB — GLUCOSE, CAPILLARY
GLUCOSE-CAPILLARY: 163 mg/dL — AB (ref 65–99)
GLUCOSE-CAPILLARY: 176 mg/dL — AB (ref 65–99)
Glucose-Capillary: 112 mg/dL — ABNORMAL HIGH (ref 65–99)
Glucose-Capillary: 124 mg/dL — ABNORMAL HIGH (ref 65–99)

## 2015-08-14 LAB — CBC
HEMATOCRIT: 26.3 % — AB (ref 36.0–46.0)
Hemoglobin: 8.5 g/dL — ABNORMAL LOW (ref 12.0–15.0)
MCH: 23.5 pg — ABNORMAL LOW (ref 26.0–34.0)
MCHC: 32.3 g/dL (ref 30.0–36.0)
MCV: 72.7 fL — AB (ref 78.0–100.0)
PLATELETS: 198 10*3/uL (ref 150–400)
RBC: 3.62 MIL/uL — AB (ref 3.87–5.11)
RDW: 19.6 % — ABNORMAL HIGH (ref 11.5–15.5)
WBC: 17.6 10*3/uL — AB (ref 4.0–10.5)

## 2015-08-14 LAB — PROTIME-INR
INR: 3.18 — ABNORMAL HIGH (ref 0.00–1.49)
Prothrombin Time: 32 seconds — ABNORMAL HIGH (ref 11.6–15.2)

## 2015-08-14 LAB — LACTIC ACID, PLASMA: LACTIC ACID, VENOUS: 2.4 mmol/L — AB (ref 0.5–2.0)

## 2015-08-14 MED ORDER — ALBUTEROL SULFATE (2.5 MG/3ML) 0.083% IN NEBU
2.5000 mg | INHALATION_SOLUTION | RESPIRATORY_TRACT | Status: DC | PRN
  Administered 2015-08-15: 2.5 mg via RESPIRATORY_TRACT
  Filled 2015-08-14 (×2): qty 3

## 2015-08-14 MED ORDER — CETYLPYRIDINIUM CHLORIDE 0.05 % MT LIQD
7.0000 mL | Freq: Two times a day (BID) | OROMUCOSAL | Status: DC
  Administered 2015-08-14 – 2015-08-15 (×3): 7 mL via OROMUCOSAL

## 2015-08-14 MED ORDER — WARFARIN SODIUM 2.5 MG PO TABS
2.5000 mg | ORAL_TABLET | Freq: Once | ORAL | Status: AC
  Administered 2015-08-14: 2.5 mg via ORAL
  Filled 2015-08-14: qty 1

## 2015-08-14 MED ORDER — FUROSEMIDE 10 MG/ML IJ SOLN
20.0000 mg | Freq: Once | INTRAMUSCULAR | Status: AC
  Administered 2015-08-14: 20 mg via INTRAVENOUS
  Filled 2015-08-14: qty 2

## 2015-08-14 NOTE — Progress Notes (Signed)
VASCULAR LAB PRELIMINARY  PRELIMINARY  PRELIMINARY  PRELIMINARY  Carotid duplex completed.    Preliminary report:  Bilateral:  1-39% ICA stenosis.  Vertebral artery flow is antegrade.     Mang Hazelrigg, RVS 08/14/2015, 12:56 PM

## 2015-08-14 NOTE — Progress Notes (Signed)
ANTICOAGULATION CONSULT NOTE - Initial Consult  Pharmacy Consult for [place] Indication: atrial fibrillation  Allergies  Allergen Reactions  . Sulfa Antibiotics Rash    All over rash  . Acetaminophen Hives  . Banana Other (See Comments)    Unknown  . Oxycodone-Acetaminophen Itching  . Penicillins Other (See Comments)    Pt had so much as a child she's immune to it  . Latex Rash  . Miralax [Polyethylene Glycol] Swelling and Rash    Took [employer] brand developed rash. Patient states she tolerated name brand MiraLax in the past.  . Tape Rash    Patient Measurements: Height: '5\' 7"'$  (170.2 cm) Weight: 184 lb 11.9 oz (83.8 kg) IBW/kg (Calculated) : 61.6 Heparin Dosing Weight:   Vital Signs: Temp: 98.4 F (36.9 C) (09/04 0707) Temp Source: Oral (09/04 0707) BP: 100/83 mmHg (09/04 0707) Pulse Rate: 80 (09/04 0707)  Labs:  Recent Labs  08/13/15 0108 08/14/15 0353  HGB 10.1*  --   HCT 32.1*  --   PLT 214  --   APTT 32  --   LABPROT 24.5* 32.0*  INR 2.23* 3.18*  CREATININE 1.05*  --     Estimated Creatinine Clearance: 54.7 mL/min (by C-G formula based on Cr of 1.05).   Medical History: Past Medical History  Diagnosis Date  . HLD (hyperlipidemia)   . HTN (hypertension)   . Arthritis   . Foot deformity     right  . CAD (coronary artery disease)     a. NSTEMI 02/2011: occ mid Cx, DES to OM2, residual nonobst LAD dz.  . Tobacco abuse     stopped smoking 2012  . Diabetes mellitus   . Atrial fibrillation     a. Dx 03/2011 - on tikosyn/coumadin.  . Anxiety   . Diverticulitis of colon 2008 and 04/2011  . Ureteral obstruction     History of gross hematuria/right hydronephrosis 2/2 to uteropelvic junction obstruction, s/p cystoscopy in January 2007 with bilateral retrograde pyelography, right ureter arthroscopy, right ureteral stent placement, bladder biopsies, stent removal since then.  . Heart attack   . Wears dentures     top  . Dysrhythmia     a-fib  . Atrial  fibrillation   . Cancer     breast    Medications:  Scheduled:  . antiseptic oral rinse  7 mL Mouth Rinse BID  . aspirin EC  81 mg Oral Daily  . atorvastatin  40 mg Oral q1800  . cefTRIAXone (ROCEPHIN)  IV  1 g Intravenous Q24H  . insulin aspart  0-9 Units Subcutaneous TID WC  . levalbuterol  1.25 mg Nebulization Once  . warfarin  2.5 mg Oral ONCE-1800    Assessment: Initially patient came in in atrial fibrillation with RVR that improved with fluids. Patient has chronic a-fib with warfarin PTA at 5 mg daily. INR supratherapeutic today at 3.18. No bleeding noted.  Goal of Therapy:  INR 2-3 Monitor platelets by anticoagulation protocol: Yes   Plan:  -Decrease warfarin to 2.5 mg today -Daily INR -Monitor for signs and symptoms of bleeding   Melburn Popper, PharmD Clinical Pharmacy Resident Pager: [phone] 08/14/2015 10:42 AM

## 2015-08-14 NOTE — Progress Notes (Addendum)
[facility] PROGRESS NOTE Interim History: 69 year old female with history of Afib, HTN, HLD, CAD s/p stent placement in 2012, MI in 2012, recurrent UTIs who was in her usual state of health until 7 pm today when she started having heavy sweating. Other symptoms included aphasia and imbalance lasting 7-11 pm en route by EMS.    Assessment/Plan: Sepsis due to   UTI (urinary tract infection): - She was started on the sepsis pathway, On rocephin 9.3.2016 - We'll cultures and urine cultures are pending. - BP and HR improved, lactic acid pending - CCM was consulted due to acute respiratory failure.  Acute respiratory failure with hypoxia due to acute pulmonary edema: - Elevated  BNP and a 2-D echo as below - She was given Lasix, she still has JVD mild crackles on the right. Still positive. CXR pending - Continue to monitor strict I's and O's daily weights low sodium diet and fluid restriction.  Aphasia and imbalance  - Now resolved nonfocal her blood pressure stable. - Sepsis can mimic a TIA, symptoms now resolved. - CT scan that showed no acute intracranial findings. - Continue aspirin.  Chronic Atrial fibrillation - Initially she came in in atrial fibrillation with RVR which improve after fluid resuscitation. Cardiac troponins were negative. - INT therapeutic.    Code Status: full Family Communication: none  Disposition Plan: Transfer to telemetry   Consultants:  PCCM  Procedures:  CT head  Echo 9.1.0626: Systolic function was normal. The estimated ejection fraction was in the range of 60% to 65%. Wall motion was normal; there were no regional wall motion abnormalities. Left ventricular diastolic function parameters were normal. Pulmonary arteries: PA peak pressure: 32 mm Hg   carotis  Antibiotics:  rocephin  HPI/Subjective: She relates is back to normal, SOB resolved.  Objective: Filed Vitals:   08/14/15 0007 08/14/15 0040 08/14/15 0407  08/14/15 0707  BP: 99/39 101/56 98/53 100/83  Pulse:  64 62 80  Temp:   97.7 F (36.5 C) 98.4 F (36.9 C)  TempSrc:   Oral Oral  Resp:  '16 16 18  '$ Height:      Weight:      SpO2:  95% 96% 99%    Intake/Output Summary (Last 24 hours) at 08/14/15 0750 Last data filed at 08/14/15 0707  Gross per 24 hour  Intake 2791.67 ml  Output    900 ml  Net 1891.67 ml   Filed Weights   08/13/15 0112 08/13/15 0510 08/13/15 0733  Weight: 81.647 kg (180 lb) 85.2 kg (187 lb 13.3 oz) 83.8 kg (184 lb 11.9 oz)    Exam:  General: Alert, awake, oriented x3, in no acute distress.  HEENT: No bruits, no goiter. -JVD Heart: Regular rate and rhythm. Lungs: Good air movement, crackles bilaterally more pronounced on the left and the right. Abdomen: Soft, nontender, nondistended, positive bowel sounds.  Neuro: Grossly intact, nonfocal.   Data Reviewed: Basic Metabolic Panel:  Recent Labs Lab 08/13/15 0108  NA 133*  K 3.9  CL 104  CO2 20*  GLUCOSE 199*  BUN 15  CREATININE 1.05*  CALCIUM 8.7*   Liver Function Tests:  Recent Labs Lab 08/13/15 0108  AST 32  ALT 19  ALKPHOS 73  BILITOT 0.8  PROT 6.7  ALBUMIN 3.1*   No results for input(s): LIPASE, AMYLASE in the last 168 hours. No results for input(s): AMMONIA in the last 168 hours. CBC:  Recent Labs Lab 08/13/15 0108  WBC 14.3*  NEUTROABS 11.6*  HGB 10.1*  HCT 32.1*  MCV 74.1*  PLT 214   Cardiac Enzymes: No results for input(s): CKTOTAL, CKMB, CKMBINDEX, TROPONINI in the last 168 hours. BNP (last 3 results)  Recent Labs  08/13/15 1155  BNP 562.7*    ProBNP (last 3 results) No results for input(s): PROBNP in the last 8760 hours.  CBG:  Recent Labs Lab 08/13/15 0859 08/13/15 1114 08/13/15 1601 08/13/15 2137  GLUCAP 128* 156* 307* 267*    Recent Results (from the past 240 hour(s))  MRSA PCR Screening     Status: None   Collection Time: 08/13/15  8:50 AM  Result Value Ref Range Status   MRSA by PCR  NEGATIVE NEGATIVE Final    Comment:        The GeneXpert MRSA Assay (FDA approved for NASAL specimens only), is one component of a comprehensive MRSA colonization surveillance program. It is not intended to diagnose MRSA infection nor to guide or monitor treatment for MRSA infections.      Studies: Dg Chest 1 View  08/13/2015   CLINICAL DATA:  Short of breath, pneumonia.  Subsequent encounter.  EXAM: CHEST  1 VIEW  COMPARISON:  Radiograph 08/13/2015  FINDINGS: Stable enlarged cardiac silhouette. There is interval increase in fine linear interstitial pattern. No pneumothorax. No focal consolidation.  IMPRESSION: Increased interstitial edema pattern.   Electronically Signed   By: Suzy Bouchard M.D.   On: 08/13/2015 07:09   Ct Head Wo Contrast  08/13/2015   CLINICAL DATA:  Acute onset of generalized weakness. Initial encounter.  EXAM: CT HEAD WITHOUT CONTRAST  TECHNIQUE: Contiguous axial images were obtained from the base of the skull through the vertex without intravenous contrast.  COMPARISON:  None.  FINDINGS: There is no evidence of acute infarction, mass lesion, or intra- or extra-axial hemorrhage on CT.  Prominence of the ventricles and sulci reflects mild to moderate cortical volume loss. Cerebellar atrophy is noted. Scattered periventricular and subcortical white matter change likely reflects small vessel ischemic microangiopathy.  The brainstem and fourth ventricle are within normal limits. The basal ganglia are unremarkable in appearance. The cerebral hemispheres demonstrate grossly normal gray-white differentiation. No mass effect or midline shift is seen.  There is no evidence of fracture; visualized osseous structures are unremarkable in appearance. The orbits are within normal limits. The paranasal sinuses and mastoid air cells are well-aerated. No significant soft tissue abnormalities are seen.  IMPRESSION: 1. No acute intracranial pathology seen on CT. 2. Mild to moderate cortical  volume loss and scattered small vessel ischemic microangiopathy.   Electronically Signed   By: Garald Balding M.D.   On: 08/13/2015 02:35   Dg Chest Portable 1 View  08/13/2015   CLINICAL DATA:  Acute onset of generalized weakness and shortness of breath. Initial encounter.  EXAM: PORTABLE CHEST - 1 VIEW  COMPARISON:  Chest radiograph performed 10/24/2012  FINDINGS: The lungs are well-aerated. Mild vascular congestion is noted. Mildly increased interstitial markings on the left side could reflect mild asymmetric interstitial edema or possibly pneumonia. There is no evidence of pleural effusion or pneumothorax.  The cardiomediastinal silhouette is within normal limits. No acute osseous abnormalities are seen.  IMPRESSION: Mild vascular congestion noted. Mildly increased interstitial markings on the left side could reflect mild asymmetric interstitial edema or possibly pneumonia.   Electronically Signed   By: Garald Balding M.D.   On: 08/13/2015 01:28    Scheduled Meds: . aspirin EC  81 mg Oral Daily  . atorvastatin  40  mg Oral q1800  . cefTRIAXone (ROCEPHIN)  IV  1 g Intravenous Q24H  . insulin aspart  0-9 Units Subcutaneous TID WC  . levalbuterol  1.25 mg Nebulization Once  . warfarin  5 mg Oral q1800  . Warfarin - Physician Dosing Inpatient   Does not apply q1800   Continuous Infusions: . sodium chloride 100 mL/hr at 08/13/15 0915  . sodium chloride 100 mL/hr at 08/14/15 0600    Time Spent: 25 min   Charlynne Cousins  [facility] Pager [phone]. If 7PM-7AM, please contact night-coverage at [email or web address], password [facility] 08/14/2015, 7:50 AM  LOS: 1 day

## 2015-08-14 NOTE — Progress Notes (Signed)
Utilization Review Completed.Melody Casey T9/03/2015  

## 2015-08-14 NOTE — Progress Notes (Signed)
Report called to paulette rn, pt transferring via w/c with belongings.

## 2015-08-15 ENCOUNTER — Other Ambulatory Visit: Payer: Self-pay

## 2015-08-15 DIAGNOSIS — A4151 Sepsis due to Escherichia coli [E. coli]: Principal | ICD-10-CM

## 2015-08-15 LAB — PROTIME-INR
INR: 2.96 — ABNORMAL HIGH (ref 0.00–1.49)
Prothrombin Time: 30.3 s — ABNORMAL HIGH (ref 11.6–15.2)

## 2015-08-15 LAB — GLUCOSE, CAPILLARY
GLUCOSE-CAPILLARY: 115 mg/dL — AB (ref 65–99)
GLUCOSE-CAPILLARY: 121 mg/dL — AB (ref 65–99)

## 2015-08-15 LAB — BASIC METABOLIC PANEL
Anion gap: 7 (ref 5–15)
BUN: 14 mg/dL (ref 6–20)
CHLORIDE: 109 mmol/L (ref 101–111)
CO2: 20 mmol/L — AB (ref 22–32)
CREATININE: 0.8 mg/dL (ref 0.44–1.00)
Calcium: 7.9 mg/dL — ABNORMAL LOW (ref 8.9–10.3)
GFR calc Af Amer: >60 mL/min (ref >60)
GFR calc non Af Amer: >60 mL/min (ref >60)
Glucose, Bld: 134 mg/dL — ABNORMAL HIGH (ref 65–99)
Potassium: 3.4 mmol/L — ABNORMAL LOW (ref 3.5–5.1)
SODIUM: 136 mmol/L (ref 135–145)

## 2015-08-15 LAB — CBC
HCT: 27 % — ABNORMAL LOW (ref 36.0–46.0)
Hemoglobin: 8.5 g/dL — ABNORMAL LOW (ref 12.0–15.0)
MCH: 23 pg — ABNORMAL LOW (ref 26.0–34.0)
MCHC: 31.5 g/dL (ref 30.0–36.0)
MCV: 73 fL — ABNORMAL LOW (ref 78.0–100.0)
Platelets: 189 10*3/uL (ref 150–400)
RBC: 3.7 MIL/uL — ABNORMAL LOW (ref 3.87–5.11)
RDW: 19.2 % — ABNORMAL HIGH (ref 11.5–15.5)
WBC: 13.1 10*3/uL — ABNORMAL HIGH (ref 4.0–10.5)

## 2015-08-15 LAB — URINE CULTURE

## 2015-08-15 MED ORDER — METOPROLOL TARTRATE 1 MG/ML IV SOLN
5.0000 mg | Freq: Once | INTRAVENOUS | Status: AC
  Administered 2015-08-15: 5 mg via INTRAVENOUS

## 2015-08-15 MED ORDER — MAGNESIUM SULFATE 2 GM/50ML IV SOLN
2.0000 g | Freq: Once | INTRAVENOUS | Status: AC
  Administered 2015-08-15: 2 g via INTRAVENOUS
  Filled 2015-08-15: qty 50

## 2015-08-15 MED ORDER — CIPROFLOXACIN HCL 500 MG PO TABS: 500.0000 mg | ORAL_TABLET | Freq: Two times a day (BID) | ORAL | Status: DC

## 2015-08-15 MED ORDER — CIPROFLOXACIN HCL 500 MG PO TABS
500.0000 mg | ORAL_TABLET | Freq: Two times a day (BID) | ORAL | Status: DC
  Administered 2015-08-15: 500 mg via ORAL
  Filled 2015-08-15: qty 1

## 2015-08-15 MED ORDER — POTASSIUM CHLORIDE CRYS ER 20 MEQ PO TBCR
40.0000 meq | EXTENDED_RELEASE_TABLET | Freq: Two times a day (BID) | ORAL | Status: DC
  Administered 2015-08-15: 40 meq via ORAL
  Filled 2015-08-15: qty 2

## 2015-08-15 MED ORDER — METOPROLOL TARTRATE 1 MG/ML IV SOLN
INTRAVENOUS | Status: AC
  Filled 2015-08-15: qty 5

## 2015-08-15 NOTE — Progress Notes (Signed)
Pt c/o SOB, heart rate went up to >150s, breathing tx given by RT. Vitals taken T 99.4, Bp 150/82,HR 113, 93% at 5LPM. EKG done. Schorr,NP made aware with new orders. Given Lopressor per order. Pt verbalized relief. HR go down to <120s. We will continue to monitor.

## 2015-08-15 NOTE — Discharge Summary (Signed)
Physician Discharge Summary  Melody Casey EPP:[id number] DOB: 1946-04-27 DOA: 08/13/2015  PCP: Reita Cliche, MD  Admit date: 08/13/2015 Discharge date: 08/15/2015  Time spent: 35 minutes  Recommendations for Outpatient Follow-up:  1. Follow up with PCP in 2 weeks  Discharge Diagnoses:  Principal Problem:   Sepsis Active Problems:   Atrial fibrillation   TIA (transient ischemic attack)   UTI (urinary tract infection)   Atrial fibrillation with rapid ventricular response   Blood poisoning   Discharge Condition: stable  Diet recommendation: regular  Filed Weights   08/13/15 0733 08/14/15 1200 08/15/15 0716  Weight: 83.8 kg (184 lb 11.9 oz) 85.6 kg (188 lb 11.4 oz) 85.548 kg (188 lb 9.6 oz)    History of present illness:  69 year old female with past medical history of A. fib coronary artery disease and recurrent UTI that comes in for having heavy swelling aphasia and imbalance with dizziness upon standing for the last 2 days.  Hospital Course:  Sepsis due to E. Coli UTI (urinary tract infection): - She was started on the sepsis pathway, STarted rocephin 9.3.2016 - Urine cultures came back Cipro sensitive to new for 5 additional days as an outpatient.  Acute respiratory failure with hypoxia due to acute pulmonary edema: - Elevated BNP and a 2-D echo Normal EF no diastolic heart failure. PCL was consulted due to her respiratory failure who recommended to treat her volume overload. - She was given a single dose of Lasix and her respiration improved. - Continue to monitor strict I's and O's daily weights low sodium diet and fluid restriction.  Aphasia and imbalance due Sepsis: - Now resolved nonfocal her blood pressure stable. - Probably due to her sepsis. - Continue aspirin.  Chronic Atrial fibrillation - Initially she came in in atrial fibrillation with RVR which improve after fluid resuscitation. Cardiac troponins were negative. - INR  therapeutic.    Procedures:  CT head  Consultations:  PCCM  Discharge Exam: Filed Vitals:   08/15/15 0716  BP: 140/74  Pulse: 71  Temp: 98.4 F (36.9 C)  Resp:     General: A&O x3 Cardiovascular: RRR Respiratory: good air movement CTA B/L  Discharge Instructions   Discharge Instructions    Diet - low sodium heart healthy    Complete by:  As directed      Increase activity slowly    Complete by:  As directed           Current Discharge Medication List    START taking these medications   Details  ciprofloxacin (CIPRO) 500 MG tablet Take 1 tablet (500 mg total) by mouth 2 (two) times daily. Qty: 12 tablet, Refills: 0      CONTINUE these medications which have NOT CHANGED   Details  ALPRAZolam (XANAX) 0.25 MG tablet Take 0.25 mg by mouth daily as needed for anxiety.     amLODipine (NORVASC) 2.5 MG tablet TAKE ONE TABLET BY MOUTH ONCE DAILY IN THE MORNING Qty: 30 tablet, Refills: 6    anastrozole (ARIMIDEX) 1 MG tablet Take 1 tablet (1 mg total) by mouth daily. Qty: 90 tablet, Refills: 3    aspirin EC 81 MG tablet Take 81 mg by mouth daily.    atorvastatin (LIPITOR) 40 MG tablet TAKE ONE & ONE-HALF TABLETS BY MOUTH IN THE EVENING. Qty: 135 tablet, Refills: 0    calcium-vitamin D (OSCAL WITH D) 500-200 MG-UNIT per tablet Take 1 tablet by mouth 2 (two) times daily.    dofetilide (TIKOSYN) 250 MCG  capsule Take 1 capsule (250 mcg total) by mouth 2 (two) times daily. Qty: 60 capsule, Refills: 11    ibuprofen (ADVIL,MOTRIN) 200 MG tablet Take 200-400 mg by mouth daily as needed. For pain    lisinopril (PRINIVIL,ZESTRIL) 40 MG tablet Take 40 mg by mouth every morning.     magnesium oxide (MAG-OX) 400 MG tablet Take 400 mg twice a day with food Qty: 60 tablet, Refills: 6    metFORMIN (GLUMETZA) 500 MG (MOD) 24 hr tablet Take 500 mg by mouth at bedtime.     niacin (NIASPAN) 500 MG CR tablet Take 1 tablet (500 mg total) by mouth at bedtime. Qty: 60 tablet,  Refills: 3    nitroGLYCERIN (NITROSTAT) 0.4 MG SL tablet Place 1 tablet (0.4 mg total) under the tongue every 5 (five) minutes as needed for chest pain. Qty: 25 tablet, Refills: 5    pantoprazole (PROTONIX) 40 MG tablet Take 40 mg by mouth daily.    potassium chloride SA (K-DUR,KLOR-CON) 20 MEQ tablet Take 20 mEq by mouth 2 (two) times daily.      warfarin (COUMADIN) 5 MG tablet Take 5 mg by mouth daily. Take 5 mg everyday.       Allergies  Allergen Reactions  . Sulfa Antibiotics Rash    All over rash  . Acetaminophen Hives  . Banana Other (See Comments)    Unknown  . Oxycodone-Acetaminophen Itching  . Penicillins Other (See Comments)    Pt had so much as a child she's immune to it  . Latex Rash  . Miralax [Polyethylene Glycol] Swelling and Rash    Took [facility] brand developed rash. Patient states she tolerated name brand MiraLax in the past.  . Tape Rash      The results of significant diagnostics from this hospitalization (including imaging, microbiology, ancillary and laboratory) are listed below for reference.    Significant Diagnostic Studies: Dg Chest 1 View  08/13/2015   CLINICAL DATA:  Short of breath, pneumonia.  Subsequent encounter.  EXAM: CHEST  1 VIEW  COMPARISON:  Radiograph 08/13/2015  FINDINGS: Stable enlarged cardiac silhouette. There is interval increase in fine linear interstitial pattern. No pneumothorax. No focal consolidation.  IMPRESSION: Increased interstitial edema pattern.   Electronically Signed   By: Suzy Bouchard M.D.   On: 08/13/2015 07:09   Ct Head Wo Contrast  08/13/2015   CLINICAL DATA:  Acute onset of generalized weakness. Initial encounter.  EXAM: CT HEAD WITHOUT CONTRAST  TECHNIQUE: Contiguous axial images were obtained from the base of the skull through the vertex without intravenous contrast.  COMPARISON:  None.  FINDINGS: There is no evidence of acute infarction, mass lesion, or intra- or extra-axial hemorrhage on CT.  Prominence of the  ventricles and sulci reflects mild to moderate cortical volume loss. Cerebellar atrophy is noted. Scattered periventricular and subcortical white matter change likely reflects small vessel ischemic microangiopathy.  The brainstem and fourth ventricle are within normal limits. The basal ganglia are unremarkable in appearance. The cerebral hemispheres demonstrate grossly normal gray-white differentiation. No mass effect or midline shift is seen.  There is no evidence of fracture; visualized osseous structures are unremarkable in appearance. The orbits are within normal limits. The paranasal sinuses and mastoid air cells are well-aerated. No significant soft tissue abnormalities are seen.  IMPRESSION: 1. No acute intracranial pathology seen on CT. 2. Mild to moderate cortical volume loss and scattered small vessel ischemic microangiopathy.   Electronically Signed   By: Francoise Schaumann.D.  On: 08/13/2015 02:35   Dg Chest Port 1 View  08/14/2015   CLINICAL DATA:  Chest pressure and shortness of breath this morning. Intermittent cough.  EXAM: PORTABLE CHEST - 1 VIEW  COMPARISON:  08/13/2015.  FINDINGS: Stable borderline enlarged cardiac silhouette. Decreased prominence of the pulmonary vasculature and interstitial markings. Diffuse osteopenia.  IMPRESSION: Improving changes of congestive heart failure.   Electronically Signed   By: Claudie Revering M.D.   On: 08/14/2015 10:21   Dg Chest Portable 1 View  08/13/2015   CLINICAL DATA:  Acute onset of generalized weakness and shortness of breath. Initial encounter.  EXAM: PORTABLE CHEST - 1 VIEW  COMPARISON:  Chest radiograph performed 10/24/2012  FINDINGS: The lungs are well-aerated. Mild vascular congestion is noted. Mildly increased interstitial markings on the left side could reflect mild asymmetric interstitial edema or possibly pneumonia. There is no evidence of pleural effusion or pneumothorax.  The cardiomediastinal silhouette is within normal limits. No acute  osseous abnormalities are seen.  IMPRESSION: Mild vascular congestion noted. Mildly increased interstitial markings on the left side could reflect mild asymmetric interstitial edema or possibly pneumonia.   Electronically Signed   By: Garald Balding M.D.   On: 08/13/2015 01:28    Microbiology: Recent Results (from the past 240 hour(s))  Blood Culture (routine x 2)     Status: None (Preliminary result)   Collection Time: 08/13/15  2:52 AM  Result Value Ref Range Status   Specimen Description BLOOD RIGHT HAND  Final   Special Requests BOTTLES DRAWN AEROBIC AND ANAEROBIC 4CC  Final   Culture NO GROWTH 1 DAY  Final   Report Status PENDING  Incomplete  Blood Culture (routine x 2)     Status: None (Preliminary result)   Collection Time: 08/13/15  2:54 AM  Result Value Ref Range Status   Specimen Description BLOOD LEFT HAND  Final   Special Requests BOTTLES DRAWN AEROBIC AND ANAEROBIC 5CC  Final   Culture NO GROWTH 1 DAY  Final   Report Status PENDING  Incomplete  Urine culture     Status: None   Collection Time: 08/13/15  3:50 AM  Result Value Ref Range Status   Specimen Description URINE, CLEAN CATCH  Final   Special Requests NONE  Final   Culture >=100,000 COLONIES/mL ESCHERICHIA COLI  Final   Report Status 08/15/2015 FINAL  Final   Organism ID, Bacteria ESCHERICHIA COLI  Final      Susceptibility   Escherichia coli - MIC*    AMPICILLIN 8 SENSITIVE Sensitive     CEFAZOLIN <=4 SENSITIVE Sensitive     CEFTRIAXONE <=1 SENSITIVE Sensitive     CIPROFLOXACIN <=0.25 SENSITIVE Sensitive     GENTAMICIN <=1 SENSITIVE Sensitive     IMIPENEM <=0.25 SENSITIVE Sensitive     NITROFURANTOIN <=16 SENSITIVE Sensitive     TRIMETH/SULFA <=20 SENSITIVE Sensitive     AMPICILLIN/SULBACTAM 4 SENSITIVE Sensitive     PIP/TAZO <=4 SENSITIVE Sensitive     * >=100,000 COLONIES/mL ESCHERICHIA COLI  MRSA PCR Screening     Status: None   Collection Time: 08/13/15  8:50 AM  Result Value Ref Range Status   MRSA  by PCR NEGATIVE NEGATIVE Final    Comment:        The GeneXpert MRSA Assay (FDA approved for NASAL specimens only), is one component of a comprehensive MRSA colonization surveillance program. It is not intended to diagnose MRSA infection nor to guide or monitor treatment for MRSA infections.  Labs: Basic Metabolic Panel:  Recent Labs Lab 08/13/15 0108 08/14/15 1116 08/15/15 0354  NA 133* 138 136  K 3.9 3.5 3.4*  CL 104 112* 109  CO2 20* 21* 20*  GLUCOSE 199* 147* 134*  BUN '15 18 14  '$ CREATININE 1.05* 0.95 0.80  CALCIUM 8.7* 8.5* 7.9*   Liver Function Tests:  Recent Labs Lab 08/13/15 0108  AST 32  ALT 19  ALKPHOS 73  BILITOT 0.8  PROT 6.7  ALBUMIN 3.1*   No results for input(s): LIPASE, AMYLASE in the last 168 hours. No results for input(s): AMMONIA in the last 168 hours. CBC:  Recent Labs Lab 08/13/15 0108 08/14/15 1116 08/15/15 0354  WBC 14.3* 17.6* 13.1*  NEUTROABS 11.6*  --   --   HGB 10.1* 8.5* 8.5*  HCT 32.1* 26.3* 27.0*  MCV 74.1* 72.7* 73.0*  PLT 214 198 189   Cardiac Enzymes: No results for input(s): CKTOTAL, CKMB, CKMBINDEX, TROPONINI in the last 168 hours. BNP: BNP (last 3 results)  Recent Labs  08/13/15 1155  BNP 562.7*    ProBNP (last 3 results) No results for input(s): PROBNP in the last 8760 hours.  CBG:  Recent Labs Lab 08/14/15 0726 08/14/15 1133 08/14/15 1622 08/14/15 2104 08/15/15 0634  GLUCAP 112* 163* 176* 124* 115*       Signed:  Charlynne Cousins  [facility] 08/15/2015, 9:59 AM

## 2015-08-17 ENCOUNTER — Ambulatory Visit: Payer: Medicare Other | Admitting: Pharmacist Clinician (PhC)/ Clinical Pharmacy Specialist

## 2015-08-17 DIAGNOSIS — Z09 Encounter for follow-up examination after completed treatment for conditions other than malignant neoplasm: Secondary | ICD-10-CM | POA: Diagnosis not present

## 2015-08-17 DIAGNOSIS — R0602 Shortness of breath: Secondary | ICD-10-CM | POA: Diagnosis not present

## 2015-08-18 ENCOUNTER — Encounter: Payer: Self-pay | Admitting: Family Medicine

## 2015-08-18 ENCOUNTER — Ambulatory Visit (INDEPENDENT_AMBULATORY_CARE_PROVIDER_SITE_OTHER): Payer: Medicare Other | Admitting: Family Medicine

## 2015-08-18 VITALS — BP 112/67 | HR 69 | Temp 97.7°F | Ht 67.0 in | Wt 184.2 lb

## 2015-08-18 DIAGNOSIS — A4151 Sepsis due to Escherichia coli [E. coli]: Secondary | ICD-10-CM | POA: Diagnosis not present

## 2015-08-18 DIAGNOSIS — E119 Type 2 diabetes mellitus without complications: Secondary | ICD-10-CM | POA: Diagnosis not present

## 2015-08-18 DIAGNOSIS — I2510 Atherosclerotic heart disease of native coronary artery without angina pectoris: Secondary | ICD-10-CM | POA: Diagnosis not present

## 2015-08-18 DIAGNOSIS — E108 Type 1 diabetes mellitus with unspecified complications: Secondary | ICD-10-CM

## 2015-08-18 LAB — CULTURE, BLOOD (ROUTINE X 2)
CULTURE: NO GROWTH
Culture: NO GROWTH

## 2015-08-18 LAB — POCT UA - MICROALBUMIN: Microalbumin Ur, POC: NEGATIVE mg/L

## 2015-08-18 LAB — POCT GLYCOSYLATED HEMOGLOBIN (HGB A1C): HEMOGLOBIN A1C: 7.4

## 2015-08-18 NOTE — Progress Notes (Signed)
   HPI  Patient presents today to establish care and for hospital follow-up  She was admitted to [facility] with Escherichia coli urosepsis. She is feeling better but still has some generalized weakness, fatigue, and slight dyspnea.  She was previously seen by Dr. Greta Doom in [place] and their practice is closing.  We reviewed and updated her past medical, surgical, social, and family history in detail.  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 POCT UA - Microalbumin  . POCT glycosylated hemoglobin (Hb A1C)    Meds ordered this encounter  Medications  . predniSONE (DELTASONE) 10 MG tablet    Sig:     Melody Apple, MD [facility] 08/18/2015, 1:55 PM

## 2015-08-18 NOTE — Patient Instructions (Signed)
Great to meet you!  Lets follow up in 1 month, we will take a look through your records.  Please let us know if you need anything.

## 2015-08-19 ENCOUNTER — Other Ambulatory Visit: Payer: Self-pay | Admitting: *Deleted

## 2015-08-21 ENCOUNTER — Other Ambulatory Visit: Payer: Self-pay | Admitting: Cardiology

## 2015-08-30 ENCOUNTER — Encounter (HOSPITAL_COMMUNITY): Payer: Self-pay | Admitting: Emergency Medicine

## 2015-08-30 ENCOUNTER — Emergency Department (HOSPITAL_COMMUNITY): Payer: Medicare Other

## 2015-08-30 ENCOUNTER — Inpatient Hospital Stay (HOSPITAL_COMMUNITY)
Admission: EM | Admit: 2015-08-30 | Discharge: 2015-09-04 | DRG: 392 | Disposition: A | Discharge status: Home / Self Care | Payer: Medicare Other | Attending: Internal Medicine | Admitting: Internal Medicine

## 2015-08-30 ENCOUNTER — Other Ambulatory Visit: Payer: Self-pay

## 2015-08-30 DIAGNOSIS — Z79899 Other long term (current) drug therapy: Secondary | ICD-10-CM | POA: Diagnosis not present

## 2015-08-30 DIAGNOSIS — R509 Fever, unspecified: Secondary | ICD-10-CM | POA: Diagnosis not present

## 2015-08-30 DIAGNOSIS — I4891 Unspecified atrial fibrillation: Secondary | ICD-10-CM | POA: Diagnosis present

## 2015-08-30 DIAGNOSIS — D509 Iron deficiency anemia, unspecified: Secondary | ICD-10-CM | POA: Diagnosis present

## 2015-08-30 DIAGNOSIS — E538 Deficiency of other specified B group vitamins: Secondary | ICD-10-CM | POA: Diagnosis present

## 2015-08-30 DIAGNOSIS — Z7952 Long term (current) use of systemic steroids: Secondary | ICD-10-CM

## 2015-08-30 DIAGNOSIS — E871 Hypo-osmolality and hyponatremia: Secondary | ICD-10-CM

## 2015-08-30 DIAGNOSIS — K5900 Constipation, unspecified: Secondary | ICD-10-CM | POA: Diagnosis present

## 2015-08-30 DIAGNOSIS — I2510 Atherosclerotic heart disease of native coronary artery without angina pectoris: Secondary | ICD-10-CM | POA: Diagnosis present

## 2015-08-30 DIAGNOSIS — Z7901 Long term (current) use of anticoagulants: Secondary | ICD-10-CM

## 2015-08-30 DIAGNOSIS — K5732 Diverticulitis of large intestine without perforation or abscess without bleeding: Secondary | ICD-10-CM | POA: Diagnosis not present

## 2015-08-30 DIAGNOSIS — I10 Essential (primary) hypertension: Secondary | ICD-10-CM | POA: Diagnosis present

## 2015-08-30 DIAGNOSIS — E119 Type 2 diabetes mellitus without complications: Secondary | ICD-10-CM | POA: Diagnosis present

## 2015-08-30 DIAGNOSIS — M069 Rheumatoid arthritis, unspecified: Secondary | ICD-10-CM | POA: Diagnosis present

## 2015-08-30 DIAGNOSIS — I714 Abdominal aortic aneurysm, without rupture, unspecified: Secondary | ICD-10-CM | POA: Diagnosis present

## 2015-08-30 DIAGNOSIS — Z87891 Personal history of nicotine dependence: Secondary | ICD-10-CM

## 2015-08-30 DIAGNOSIS — Z801 Family history of malignant neoplasm of trachea, bronchus and lung: Secondary | ICD-10-CM

## 2015-08-30 DIAGNOSIS — F419 Anxiety disorder, unspecified: Secondary | ICD-10-CM | POA: Diagnosis present

## 2015-08-30 DIAGNOSIS — N289 Disorder of kidney and ureter, unspecified: Secondary | ICD-10-CM | POA: Diagnosis not present

## 2015-08-30 DIAGNOSIS — Z833 Family history of diabetes mellitus: Secondary | ICD-10-CM | POA: Diagnosis not present

## 2015-08-30 DIAGNOSIS — Z7982 Long term (current) use of aspirin: Secondary | ICD-10-CM | POA: Diagnosis not present

## 2015-08-30 DIAGNOSIS — Z853 Personal history of malignant neoplasm of breast: Secondary | ICD-10-CM

## 2015-08-30 DIAGNOSIS — Z955 Presence of coronary angioplasty implant and graft: Secondary | ICD-10-CM

## 2015-08-30 DIAGNOSIS — Z9861 Coronary angioplasty status: Secondary | ICD-10-CM

## 2015-08-30 DIAGNOSIS — I252 Old myocardial infarction: Secondary | ICD-10-CM | POA: Diagnosis not present

## 2015-08-30 DIAGNOSIS — Z8249 Family history of ischemic heart disease and other diseases of the circulatory system: Secondary | ICD-10-CM

## 2015-08-30 DIAGNOSIS — I482 Chronic atrial fibrillation: Secondary | ICD-10-CM | POA: Diagnosis present

## 2015-08-30 DIAGNOSIS — E876 Hypokalemia: Secondary | ICD-10-CM | POA: Diagnosis present

## 2015-08-30 DIAGNOSIS — D1800 Hemangioma unspecified site: Secondary | ICD-10-CM | POA: Insufficient documentation

## 2015-08-30 DIAGNOSIS — R32 Unspecified urinary incontinence: Secondary | ICD-10-CM | POA: Diagnosis present

## 2015-08-30 DIAGNOSIS — N179 Acute kidney failure, unspecified: Secondary | ICD-10-CM | POA: Diagnosis not present

## 2015-08-30 DIAGNOSIS — K5790 Diverticulosis of intestine, part unspecified, without perforation or abscess without bleeding: Secondary | ICD-10-CM | POA: Insufficient documentation

## 2015-08-30 DIAGNOSIS — E785 Hyperlipidemia, unspecified: Secondary | ICD-10-CM | POA: Diagnosis present

## 2015-08-30 DIAGNOSIS — K5792 Diverticulitis of intestine, part unspecified, without perforation or abscess without bleeding: Secondary | ICD-10-CM | POA: Diagnosis not present

## 2015-08-30 DIAGNOSIS — R1032 Left lower quadrant pain: Secondary | ICD-10-CM | POA: Diagnosis not present

## 2015-08-30 HISTORY — DX: Hemangioma unspecified site: D18.00

## 2015-08-30 HISTORY — DX: Diverticulitis of intestine, part unspecified, without perforation or abscess without bleeding: K57.92

## 2015-08-30 HISTORY — DX: Abdominal aortic aneurysm, without rupture: I71.4

## 2015-08-30 HISTORY — DX: Abdominal aortic aneurysm, without rupture, unspecified: I71.40

## 2015-08-30 LAB — BASIC METABOLIC PANEL
Anion gap: 5 (ref 5–15)
BUN: 31 mg/dL — AB (ref 6–20)
CALCIUM: 7.8 mg/dL — AB (ref 8.9–10.3)
CO2: 22 mmol/L (ref 22–32)
CREATININE: 1.25 mg/dL — AB (ref 0.44–1.00)
Chloride: 101 mmol/L (ref 101–111)
GFR calc Af Amer: 49 mL/min — ABNORMAL LOW (ref >60)
GFR, EST NON AFRICAN AMERICAN: 42 mL/min — AB (ref >60)
GLUCOSE: 170 mg/dL — AB (ref 65–99)
POTASSIUM: 4 mmol/L (ref 3.5–5.1)
Sodium: 128 mmol/L — ABNORMAL LOW (ref 135–145)

## 2015-08-30 LAB — CBC WITH DIFFERENTIAL/PLATELET
BASOS PCT: 0 %
Basophils Absolute: 0 10*3/uL (ref 0.0–0.1)
EOS PCT: 0 %
Eosinophils Absolute: 0 10*3/uL (ref 0.0–0.7)
HCT: 27.5 % — ABNORMAL LOW (ref 36.0–46.0)
Hemoglobin: 8.6 g/dL — ABNORMAL LOW (ref 12.0–15.0)
LYMPHS ABS: 1.5 10*3/uL (ref 0.7–4.0)
Lymphocytes Relative: 10 %
MCH: 22.9 pg — AB (ref 26.0–34.0)
MCHC: 31.3 g/dL (ref 30.0–36.0)
MCV: 73.1 fL — AB (ref 78.0–100.0)
MONO ABS: 1.5 10*3/uL — AB (ref 0.1–1.0)
Monocytes Relative: 10 %
NEUTROS ABS: 12.2 10*3/uL — AB (ref 1.7–7.7)
Neutrophils Relative %: 80 %
PLATELETS: 264 10*3/uL (ref 150–400)
RBC: 3.76 MIL/uL — ABNORMAL LOW (ref 3.87–5.11)
RDW: 19 % — AB (ref 11.5–15.5)
WBC: 15.2 10*3/uL — ABNORMAL HIGH (ref 4.0–10.5)

## 2015-08-30 LAB — I-STAT CG4 LACTIC ACID, ED: LACTIC ACID, VENOUS: 1.8 mmol/L (ref 0.5–2.0)

## 2015-08-30 LAB — GLUCOSE, CAPILLARY
Glucose-Capillary: 131 mg/dL — ABNORMAL HIGH (ref 65–99)
Glucose-Capillary: 132 mg/dL — ABNORMAL HIGH (ref 65–99)

## 2015-08-30 LAB — HEPATIC FUNCTION PANEL
ALT: 12 U/L — ABNORMAL LOW (ref 14–54)
AST: 20 U/L (ref 15–41)
Albumin: 2.5 g/dL — ABNORMAL LOW (ref 3.5–5.0)
Alkaline Phosphatase: 65 U/L (ref 38–126)
BILIRUBIN DIRECT: 0.2 mg/dL (ref 0.1–0.5)
BILIRUBIN INDIRECT: 0.7 mg/dL (ref 0.3–0.9)
BILIRUBIN TOTAL: 0.9 mg/dL (ref 0.3–1.2)
Total Protein: 6.3 g/dL — ABNORMAL LOW (ref 6.5–8.1)

## 2015-08-30 LAB — LIPASE, BLOOD: Lipase: 28 U/L (ref 22–51)

## 2015-08-30 LAB — PROTIME-INR
INR: 2.45 — AB (ref 0.00–1.49)
PROTHROMBIN TIME: 26.3 s — AB (ref 11.6–15.2)

## 2015-08-30 MED ORDER — ONDANSETRON HCL 4 MG PO TABS: 4.0000 mg | ORAL_TABLET | Freq: Four times a day (QID) | ORAL | Status: DC | PRN

## 2015-08-30 MED ORDER — DOFETILIDE 125 MCG PO CAPS
250.0000 ug | ORAL_CAPSULE | Freq: Two times a day (BID) | ORAL | Status: DC
  Administered 2015-08-30 – 2015-09-04 (×10): 250 ug via ORAL
  Filled 2015-08-30 (×13): qty 2

## 2015-08-30 MED ORDER — MORPHINE SULFATE (PF) 4 MG/ML IV SOLN
4.0000 mg | Freq: Once | INTRAVENOUS | Status: AC
  Administered 2015-08-30: 4 mg via INTRAVENOUS
  Filled 2015-08-30: qty 1

## 2015-08-30 MED ORDER — IOHEXOL 300 MG/ML  SOLN
25.0000 mL | Freq: Once | INTRAMUSCULAR | Status: AC | PRN
  Administered 2015-08-30: 25 mL via ORAL

## 2015-08-30 MED ORDER — CIPROFLOXACIN IN D5W 400 MG/200ML IV SOLN
400.0000 mg | Freq: Two times a day (BID) | INTRAVENOUS | Status: DC
  Administered 2015-08-31 – 2015-09-04 (×9): 400 mg via INTRAVENOUS
  Filled 2015-08-30 (×10): qty 200

## 2015-08-30 MED ORDER — DILTIAZEM HCL 25 MG/5ML IV SOLN
5.0000 mg | Freq: Once | INTRAVENOUS | Status: AC
  Administered 2015-08-30: 5 mg via INTRAVENOUS
  Filled 2015-08-30: qty 5

## 2015-08-30 MED ORDER — PANTOPRAZOLE SODIUM 40 MG IV SOLR
40.0000 mg | Freq: Every day | INTRAVENOUS | Status: DC
  Administered 2015-08-30 – 2015-09-03 (×5): 40 mg via INTRAVENOUS
  Filled 2015-08-30 (×5): qty 40

## 2015-08-30 MED ORDER — ACETAMINOPHEN 325 MG PO TABS: 650.0000 mg | ORAL_TABLET | Freq: Four times a day (QID) | ORAL | Status: DC | PRN

## 2015-08-30 MED ORDER — ONDANSETRON HCL 4 MG/2ML IJ SOLN: 4.0000 mg | Freq: Four times a day (QID) | INTRAMUSCULAR | Status: DC | PRN

## 2015-08-30 MED ORDER — ALPRAZOLAM 0.25 MG PO TABS
0.2500 mg | ORAL_TABLET | Freq: Every day | ORAL | Status: DC | PRN
  Administered 2015-09-01 – 2015-09-03 (×2): 0.25 mg via ORAL
  Filled 2015-08-30 (×2): qty 1

## 2015-08-30 MED ORDER — IOHEXOL 300 MG/ML  SOLN
100.0000 mL | Freq: Once | INTRAMUSCULAR | Status: AC | PRN
  Administered 2015-08-30: 100 mL via INTRAVENOUS

## 2015-08-30 MED ORDER — WARFARIN - PHARMACIST DOSING INPATIENT
Status: DC
  Administered 2015-08-31: 16:00:00

## 2015-08-30 MED ORDER — ACETAMINOPHEN 650 MG RE SUPP: 650.0000 mg | Freq: Four times a day (QID) | RECTAL | Status: DC | PRN

## 2015-08-30 MED ORDER — ANASTROZOLE 1 MG PO TABS
1.0000 mg | ORAL_TABLET | Freq: Every day | ORAL | Status: DC
  Administered 2015-08-30 – 2015-09-04 (×6): 1 mg via ORAL
  Filled 2015-08-30 (×7): qty 1

## 2015-08-30 MED ORDER — SODIUM CHLORIDE 0.9 % IV SOLN
INTRAVENOUS | Status: DC
  Administered 2015-08-30: 17:00:00 via INTRAVENOUS

## 2015-08-30 MED ORDER — METRONIDAZOLE IN NACL 5-0.79 MG/ML-% IV SOLN
500.0000 mg | Freq: Three times a day (TID) | INTRAVENOUS | Status: DC
  Administered 2015-08-30 – 2015-09-04 (×15): 500 mg via INTRAVENOUS
  Filled 2015-08-30 (×15): qty 100

## 2015-08-30 MED ORDER — WARFARIN SODIUM 5 MG PO TABS
2.5000 mg | ORAL_TABLET | Freq: Once | ORAL | Status: AC
  Administered 2015-08-30: 2.5 mg via ORAL
  Filled 2015-08-30: qty 1

## 2015-08-30 MED ORDER — ONDANSETRON HCL 4 MG/2ML IJ SOLN
4.0000 mg | Freq: Once | INTRAMUSCULAR | Status: AC
  Administered 2015-08-30: 4 mg via INTRAVENOUS
  Filled 2015-08-30: qty 2

## 2015-08-30 MED ORDER — ENOXAPARIN SODIUM 30 MG/0.3ML ~~LOC~~ SOLN: 30.0000 mg | SUBCUTANEOUS | Status: DC

## 2015-08-30 MED ORDER — METOPROLOL TARTRATE 1 MG/ML IV SOLN: 5.0000 mg | INTRAVENOUS | Status: DC | PRN

## 2015-08-30 MED ORDER — MORPHINE SULFATE (PF) 2 MG/ML IV SOLN
2.0000 mg | INTRAVENOUS | Status: DC | PRN
  Administered 2015-08-31 – 2015-09-02 (×10): 2 mg via INTRAVENOUS
  Filled 2015-08-30 (×10): qty 1

## 2015-08-30 MED ORDER — INSULIN ASPART 100 UNIT/ML ~~LOC~~ SOLN: 0.0000 [IU] | Freq: Every day | SUBCUTANEOUS | Status: DC

## 2015-08-30 MED ORDER — PREDNISONE 10 MG PO TABS
10.0000 mg | ORAL_TABLET | Freq: Every morning | ORAL | Status: DC
  Administered 2015-08-31: 10 mg via ORAL
  Filled 2015-08-30: qty 1

## 2015-08-30 MED ORDER — INSULIN ASPART 100 UNIT/ML ~~LOC~~ SOLN
0.0000 [IU] | Freq: Three times a day (TID) | SUBCUTANEOUS | Status: DC
  Administered 2015-08-30 – 2015-09-04 (×3): 2 [IU] via SUBCUTANEOUS

## 2015-08-30 MED ORDER — CIPROFLOXACIN IN D5W 400 MG/200ML IV SOLN
400.0000 mg | Freq: Once | INTRAVENOUS | Status: AC
  Administered 2015-08-30: 400 mg via INTRAVENOUS
  Filled 2015-08-30: qty 200

## 2015-08-30 MED ORDER — METRONIDAZOLE IN NACL 5-0.79 MG/ML-% IV SOLN: 500.0000 mg | Freq: Once | INTRAVENOUS | Status: DC

## 2015-08-30 MED ORDER — SODIUM CHLORIDE 0.9 % IJ SOLN
3.0000 mL | Freq: Two times a day (BID) | INTRAMUSCULAR | Status: DC
  Administered 2015-08-31 – 2015-09-04 (×6): 3 mL via INTRAVENOUS

## 2015-08-30 MED ORDER — METRONIDAZOLE IN NACL 5-0.79 MG/ML-% IV SOLN
500.0000 mg | Freq: Once | INTRAVENOUS | Status: DC
Start: 2015-08-30 — End: 2015-08-30

## 2015-08-30 MED ORDER — SODIUM CHLORIDE 0.9 % IV BOLUS (SEPSIS)
500.0000 mL | Freq: Once | INTRAVENOUS | Status: AC
  Administered 2015-08-30: 500 mL via INTRAVENOUS

## 2015-08-30 NOTE — ED Notes (Signed)
Left arm redness noted along lines of veins post Cipro. Dr. Roderic Palau made aware and assessed left arm. States patient may go up but tell nurse to call admitting Dr. And make aware. Orders to be carried out.

## 2015-08-30 NOTE — Progress Notes (Signed)
ANTICOAGULATION CONSULT NOTE - Initial Consult  Pharmacy Consult for Coumadin Indication: atrial fibrillation  Allergies  Allergen Reactions  . Vancomycin Rash and Shortness Of Breath  . Sulfa Antibiotics Rash    All over rash  . Sulfacetamide Sodium Rash    All over rash  . Acetaminophen Hives  . Banana Other (See Comments)    Unknown  . Oxycodone-Acetaminophen Itching  . Penicillins Other (See Comments)    Pt had so much as a child she's immune to it  . Latex Rash  . Miralax [Polyethylene Glycol] Swelling and Rash    Took [facility] brand developed rash. Patient states she tolerated name brand MiraLax in the past.  . Tape Rash    Patient Measurements: Height: '5\' 8"'$  (172.7 cm) Weight: 180 lb (81.647 kg) IBW/kg (Calculated) : 63.9 Heparin Dosing Weight:   Vital Signs: Temp: 98.7 F (37.1 C) (09/20 1504) Temp Source: Oral (09/20 1504) BP: 123/44 mmHg (09/20 1504) Pulse Rate: 88 (09/20 1504)  Labs:  Recent Labs  08/30/15 0926  HGB 8.6*  HCT 27.5*  PLT 264  LABPROT 26.3*  INR 2.45*  CREATININE 1.25*    Estimated Creatinine Clearance: 46.3 mL/min (by C-G formula based on Cr of 1.25).   Medical History: Past Medical History  Diagnosis Date  . HLD (hyperlipidemia)   . HTN (hypertension)   . Arthritis   . Foot deformity     right  . CAD (coronary artery disease)     a. NSTEMI 02/2011: occ mid Cx, DES to OM2, residual nonobst LAD dz.  . Tobacco abuse     stopped smoking 2012  . Diabetes mellitus   . Atrial fibrillation     a. Dx 03/2011 - on tikosyn/coumadin.  . Anxiety   . Diverticulitis of colon 2008 and 04/2011  . Ureteral obstruction     History of gross hematuria/right hydronephrosis 2/2 to uteropelvic junction obstruction, s/p cystoscopy in January 2007 with bilateral retrograde pyelography, right ureter arthroscopy, right ureteral stent placement, bladder biopsies, stent removal since then.  . Heart attack   . Wears dentures     top  . Dysrhythmia    a-fib  . Atrial fibrillation   . Cancer     breast  . Diverticulitis   . Hemangioma     liver    Medications:  Prescriptions prior to admission  Medication Sig Dispense Refill Last Dose  . ALPRAZolam (XANAX) 0.25 MG tablet Take 0.25 mg by mouth daily as needed for anxiety.    Past Week at Unknown time  . amLODipine (NORVASC) 2.5 MG tablet TAKE ONE TABLET BY MOUTH ONCE DAILY IN THE MORNING 30 tablet 6 08/29/2015 at Unknown time  . anastrozole (ARIMIDEX) 1 MG tablet Take 1 tablet (1 mg total) by mouth daily. 90 tablet 3 08/29/2015 at Unknown time  . aspirin EC 81 MG tablet Take 81 mg by mouth daily.   08/29/2015 at Unknown time  . atorvastatin (LIPITOR) 40 MG tablet TAKE ONE AND ONE-HALF TABLETS BY MOUTH IN THE EVENING 135 tablet 1 08/29/2015 at Unknown time  . calcium-vitamin D (OSCAL WITH D) 500-200 MG-UNIT per tablet Take 1 tablet by mouth 2 (two) times daily.   08/29/2015 at Unknown time  . ciprofloxacin (CIPRO) 500 MG tablet Take 1 tablet (500 mg total) by mouth 2 (two) times daily. 12 tablet 0 08/29/2015 at Unknown time  . dofetilide (TIKOSYN) 250 MCG capsule Take 1 capsule (250 mcg total) by mouth 2 (two) times daily. 60 capsule 11  08/29/2015 at Unknown time  . ibuprofen (ADVIL,MOTRIN) 200 MG tablet Take 200-400 mg by mouth daily as needed. For pain   08/29/2015 at Unknown time  . lisinopril (PRINIVIL,ZESTRIL) 40 MG tablet Take 40 mg by mouth every morning.    08/29/2015 at Unknown time  . magnesium oxide (MAG-OX) 400 MG tablet Take 400 mg twice a day with food 60 tablet 6 08/29/2015 at Unknown time  . metFORMIN (GLUMETZA) 500 MG (MOD) 24 hr tablet Take 500 mg by mouth at bedtime.    08/29/2015 at Unknown time  . niacin (NIASPAN) 500 MG CR tablet Take 1 tablet (500 mg total) by mouth at bedtime. 60 tablet 3 08/29/2015 at Unknown time  . nitroGLYCERIN (NITROSTAT) 0.4 MG SL tablet Place 1 tablet (0.4 mg total) under the tongue every 5 (five) minutes as needed for chest pain. 25 tablet 5 Unknown  .  pantoprazole (PROTONIX) 40 MG tablet Take 40 mg by mouth daily.   08/29/2015 at Unknown time  . potassium chloride SA (K-DUR,KLOR-CON) 20 MEQ tablet Take 20 mEq by mouth 2 (two) times daily.     08/29/2015 at Unknown time  . predniSONE (DELTASONE) 10 MG tablet    08/29/2015 at Unknown time  . warfarin (COUMADIN) 5 MG tablet Take 5 mg by mouth daily. Take 5 mg everyday.   Past Week at Unknown time    Assessment: Continuation of Coumadin PTA for AFIB Past history noted elevated INR 3.18 (08/14/15) at [facility] when on Coumadin 5 mg po daily. Patient discharge summary listed Coumadin 5 mg po daily. Patient on Cipro/Flagyl this admission which may increase INR in combination with Coumadin INR therapeutic on admission, 2.45  Goal of Therapy:  INR 2-3 Monitor platelets by anticoagulation protocol: Yes   Plan:  Coumadin 2.5 mg po x 1 dose tonight (lower than PTA due to Cipro/Flagyl interaction) INR/PT daily Monitor platelets/CBC Abner Greenspan, Person Bennett 08/30/2015,4:18 PM

## 2015-08-30 NOTE — Progress Notes (Signed)
HR ranging 110's to 120's, no acute distress noted.

## 2015-08-30 NOTE — ED Provider Notes (Signed)
CSN: [id number]     Arrival date & time 08/30/15  0908 History  This chart was scribed for Melody Ferguson, MD by Terressa Koyanagi, ED Scribe. This patient was seen in room APA01/APA01 and the patient's care was started at 9:38 AM.   Chief Complaint  Patient presents with  . Abdominal Pain   Patient is a 69 y.o. female presenting with abdominal pain. The history is provided by the patient. No language interpreter was used.  Abdominal Pain Pain location:  LLQ Pain quality comment:  Consistent with prior diverticulitis pain Pain severity:  Severe Onset quality:  Sudden Duration:  3 days Timing:  Intermittent Chronicity:  Recurrent Relieved by:  Nothing Ineffective treatments:  OTC medications Associated symptoms: no chest pain, no cough, no diarrhea, no fatigue and no hematuria   Risk factors: being elderly, multiple surgeries and recent hospitalization (admitted to [facility] on 08/13/15 with Escherichia coli urosepsis )    PCP: Kenn File, MD HPI Comments: CHARDA JANIS is a 69 y.o. female, with PMHx noted below including diverticulitis of colon, who presents to the Emergency Department complaining of atraumatic, sudden onset, recurrent, intermittent, 7/10 LLQ abd pain onset three days ago. Pt notes her current pain is consistent with diverticulitis pain. Pt reports taking zofran, cipro and tramadol at home with no relief. Pt denies home O2 use.   Past Medical History  Diagnosis Date  . HLD (hyperlipidemia)   . HTN (hypertension)   . Arthritis   . Foot deformity     right  . CAD (coronary artery disease)     a. NSTEMI 02/2011: occ mid Cx, DES to OM2, residual nonobst LAD dz.  . Tobacco abuse     stopped smoking 2012  . Diabetes mellitus   . Atrial fibrillation     a. Dx 03/2011 - on tikosyn/coumadin.  . Anxiety   . Diverticulitis of colon 2008 and 04/2011  . Ureteral obstruction     History of gross hematuria/right hydronephrosis 2/2 to uteropelvic junction obstruction,  s/p cystoscopy in January 2007 with bilateral retrograde pyelography, right ureter arthroscopy, right ureteral stent placement, bladder biopsies, stent removal since then.  . Heart attack   . Wears dentures     top  . Dysrhythmia     a-fib  . Atrial fibrillation   . Cancer     breast   Past Surgical History  Procedure Laterality Date  . Tonsillectomy and adenoidectomy    . Knee arthroscopy      left  . Tubal ligation    . Right leg surgery  age 60    As a child for  ?scleroderma per patient  . Ureteral surgery  2011    rt ureterostomy-stent  . Coronary stent placement  2012  . Cardiac catheterization  2012    stent  . Breast lumpectomy with needle localization and axillary sentinel lymph node bx Right 03/01/2014    Procedure: BREAST LUMPECTOMY WITH NEEDLE LOCALIZATION AND AXILLARY SENTINEL LYMPH NODE BX;  Surgeon: Edward Jolly, MD;  Location: [place];  Service: General;  Laterality: Right;  . Colonoscopy with propofol N/A 06/29/2014    Procedure: COLONOSCOPY WITH PROPOFOL;  Surgeon: Garlan Fair, MD;  Location: [facility];  Service: Endoscopy;  Laterality: N/A;   Family History  Problem Relation Age of Onset  . Colon cancer Neg Hx   . Liver disease Neg Hx   . Lung cancer Father 53    deceased  . Cancer Father   .  Heart failure Mother 49    deceased  . Heart disease Mother   . Diabetes Brother    Social History  Substance Use Topics  . Smoking status: Former Smoker -- 2.00 packs/day for 50 years    Types: Cigarettes    Quit date: 02/08/2011  . Smokeless tobacco: Never Used  . Alcohol Use: No   OB History    No data available     Review of Systems  Constitutional: Negative for appetite change and fatigue.  HENT: Negative for congestion, ear discharge and sinus pressure.   Eyes: Negative for discharge.  Respiratory: Negative for cough.   Cardiovascular: Negative for chest pain.  Gastrointestinal: Positive for abdominal pain. Negative  for diarrhea.  Genitourinary: Negative for frequency and hematuria.  Musculoskeletal: Negative for back pain.  Skin: Negative for rash.  Neurological: Negative for seizures and headaches.  Psychiatric/Behavioral: Negative for hallucinations.   Allergies  Vancomycin; Sulfa antibiotics; Sulfacetamide sodium; Acetaminophen; Banana; Oxycodone-acetaminophen; Penicillins; Latex; Miralax; and Tape  Home Medications   Prior to Admission medications   Medication Sig Start Date End Date Taking? Authorizing Provider  ALPRAZolam Duanne Moron) 0.25 MG tablet Take 0.25 mg by mouth daily as needed for anxiety.    Yes Historical Provider, MD  amLODipine (NORVASC) 2.5 MG tablet TAKE ONE TABLET BY MOUTH ONCE DAILY IN THE MORNING 07/05/15  Yes Minus Breeding, MD  anastrozole (ARIMIDEX) 1 MG tablet Take 1 tablet (1 mg total) by mouth daily. 06/20/15  Yes Laurie Panda, NP  aspirin EC 81 MG tablet Take 81 mg by mouth daily.   Yes Historical Provider, MD  atorvastatin (LIPITOR) 40 MG tablet TAKE ONE AND ONE-HALF TABLETS BY MOUTH IN THE EVENING 08/22/15  Yes Evans Lance, MD  calcium-vitamin D (OSCAL WITH D) 500-200 MG-UNIT per tablet Take 1 tablet by mouth 2 (two) times daily.   Yes Historical Provider, MD  ciprofloxacin (CIPRO) 500 MG tablet Take 1 tablet (500 mg total) by mouth 2 (two) times daily. 08/15/15  Yes Charlynne Cousins, MD  dofetilide (TIKOSYN) 250 MCG capsule Take 1 capsule (250 mcg total) by mouth 2 (two) times daily. 07/28/14  Yes Minus Breeding, MD  ibuprofen (ADVIL,MOTRIN) 200 MG tablet Take 200-400 mg by mouth daily as needed. For pain   Yes Historical Provider, MD  lisinopril (PRINIVIL,ZESTRIL) 40 MG tablet Take 40 mg by mouth every morning.    Yes Historical Provider, MD  magnesium oxide (MAG-OX) 400 MG tablet Take 400 mg twice a day with food 08/17/14  Yes Minus Breeding, MD  metFORMIN (GLUMETZA) 500 MG (MOD) 24 hr tablet Take 500 mg by mouth at bedtime.    Yes Historical Provider, MD  niacin  (NIASPAN) 500 MG CR tablet Take 1 tablet (500 mg total) by mouth at bedtime. 08/11/14  Yes Minus Breeding, MD  nitroGLYCERIN (NITROSTAT) 0.4 MG SL tablet Place 1 tablet (0.4 mg total) under the tongue every 5 (five) minutes as needed for chest pain. 06/30/15  Yes Minus Breeding, MD  pantoprazole (PROTONIX) 40 MG tablet Take 40 mg by mouth daily.   Yes Historical Provider, MD  potassium chloride SA (K-DUR,KLOR-CON) 20 MEQ tablet Take 20 mEq by mouth 2 (two) times daily.     Yes Historical Provider, MD  predniSONE (DELTASONE) 10 MG tablet  06/27/15  Yes Historical Provider, MD  warfarin (COUMADIN) 5 MG tablet Take 5 mg by mouth daily. Take 5 mg everyday.   Yes Historical Provider, MD   Triage Vitals: BP 108/57 mmHg  Pulse 88  Temp(Src) 98.4 F (36.9 C)  Resp 20  Ht '5\' 8"'$  (1.727 m)  Wt 180 lb (81.647 kg)  BMI 27.38 kg/m2  SpO2 95% Physical Exam  Constitutional: She is oriented to person, place, and time. She appears well-developed.  HENT:  Head: Normocephalic.  Eyes: Conjunctivae and EOM are normal. No scleral icterus.  Neck: Neck supple. No thyromegaly present.  Cardiovascular: Normal rate and regular rhythm.  Exam reveals no gallop and no friction rub.   No murmur heard. Pulmonary/Chest: No stridor. She has no wheezes. She has no rales. She exhibits no tenderness.  Abdominal: She exhibits no distension. There is tenderness in the right lower quadrant and left lower quadrant. There is no rebound.  Musculoskeletal: Normal range of motion. She exhibits no edema.  Lymphadenopathy:    She has no cervical adenopathy.  Neurological: She is oriented to person, place, and time. She exhibits normal muscle tone. Coordination normal.  Skin: No rash noted. No erythema.  Psychiatric: She has a normal mood and affect. Her behavior is normal.    ED Course  Procedures (including critical care time) DIAGNOSTIC STUDIES: Oxygen Saturation is 95% on 2L/min.   COORDINATION OF CARE: 9:40 AM: Discussed  treatment plan with pt at bedside; patient verbalizes understanding and agrees with treatment plan.  Labs Review Labs Reviewed  CBC WITH DIFFERENTIAL/PLATELET  BASIC METABOLIC PANEL  URINALYSIS, ROUTINE W REFLEX MICROSCOPIC (NOT AT [facility])    Imaging Review No results found. I have personally reviewed and evaluated these images and lab results as part of my medical decision-making.   EKG Interpretation None      MDM   Final diagnoses:  None   Admit,  Diverticulitis  The chart was scribed for me under my direct supervision.  I personally performed the history, physical, and medical decision making and all procedures in the evaluation of this patient.Melody Ferguson, MD 08/30/15 [phone]

## 2015-08-30 NOTE — H&P (Signed)
[facility] History and Physical  Melody Casey ZOX:[id number] DOB: [birth year] DOA: 08/30/2015  Referring physician: [email or web address] PCP: Kenn File, MD   Chief Complaint: abdominal pain  HPI: Melody Casey is a 69 y.o. female with past medical history that includes diverticulosis, recent urosepsis, diabetes, A. fib, TIA, hypertension, hyperlipidemia, CAD status post stent placement 2012, MI in 2012, diabetes presents to the emergency department from home with the chief complaint of persistent abdominal pain. Initial evaluation in the emergency department reveals, diverticulitis and acute renal insufficiency.   Patient reports that today get ago she developed sudden onset diffuse abdominal pain but mostly in the lower quadrants. She rates the pain an 8 out of 10 and describes it as sharp and constant. Associated symptoms include nausea without vomiting. She denies diarrhea constipation abdominal bloating. She denies fever chills headache chest pain palpitations shortness of breath. She denies dysuria hematuria frequency or urgency. Workup in the emergency department includes CT abdomen pelvis suggestive of diverticulitis. Lab work significant for WBCs 15.2 hemoglobin 8.6 sodium 128 when necessary 31 creatinine 1.25 calcium 7.8 INR 2.45 leukosis 170. Lactic acid is 1.8. Hepatic function panel total protein 6.3 albumen 2.5 ALT 12 otherwise unremarkable. Urinalysis is pending.  In the emergency department she is afebrile her blood pressure is on the soft side she is not hypoxic. She is provided with home will saline 500 mils intravenously, morphine intravenously Flagyl and Cipro intravenously.  Review of Systems:  10 point review of systems complete and all systems are negative except as indicated in the history of present illness  Past Medical History  Diagnosis Date  . HLD (hyperlipidemia)   . HTN (hypertension)   . Arthritis   . Foot deformity     right  . CAD (coronary artery disease)       a. NSTEMI 02/2011: occ mid Cx, DES to OM2, residual nonobst LAD dz.  . Tobacco abuse     stopped smoking 2012  . Diabetes mellitus   . Atrial fibrillation     a. Dx 03/2011 - on tikosyn/coumadin.  . Anxiety   . Diverticulitis of colon 2008 and 04/2011  . Ureteral obstruction     History of gross hematuria/right hydronephrosis 2/2 to uteropelvic junction obstruction, s/p cystoscopy in January 2007 with bilateral retrograde pyelography, right ureter arthroscopy, right ureteral stent placement, bladder biopsies, stent removal since then.  . Heart attack   . Wears dentures     top  . Dysrhythmia     a-fib  . Atrial fibrillation   . Cancer     breast  . Diverticulitis    Past Surgical History  Procedure Laterality Date  . Tonsillectomy and adenoidectomy    . Knee arthroscopy      left  . Tubal ligation    . Right leg surgery  age 18    As a child for  ?scleroderma per patient  . Ureteral surgery  2011    rt ureterostomy-stent  . Coronary stent placement  2012  . Cardiac catheterization  2012    stent  . Breast lumpectomy with needle localization and axillary sentinel lymph node bx Right 03/01/2014    Procedure: BREAST LUMPECTOMY WITH NEEDLE LOCALIZATION AND AXILLARY SENTINEL LYMPH NODE BX;  Surgeon: Edward Jolly, MD;  Location: [place];  Service: General;  Laterality: Right;  . Colonoscopy with propofol N/A 06/29/2014    Procedure: COLONOSCOPY WITH PROPOFOL;  Surgeon: Garlan Fair, MD;  Location: [facility];  Service: Endoscopy;  Laterality: N/A;   Social History:  reports that she quit smoking about 4 years ago. Her smoking use included Cigarettes. She has a 100 pack-year smoking history. She has never used smokeless tobacco. She reports that she does not drink alcohol or use illicit drugs. She lives at home with her husband she is independent with ADLs she ambulates without assistance Allergies  Allergen Reactions  . Vancomycin Rash and Shortness Of  Breath  . Sulfa Antibiotics Rash    All over rash  . Sulfacetamide Sodium Rash    All over rash  . Acetaminophen Hives  . Banana Other (See Comments)    Unknown  . Oxycodone-Acetaminophen Itching  . Penicillins Other (See Comments)    Pt had so much as a child she's immune to it  . Latex Rash  . Miralax [Polyethylene Glycol] Swelling and Rash    Took [employer] brand developed rash. Patient states she tolerated name brand MiraLax in the past.  . Tape Rash    Family History  Problem Relation Age of Onset  . Colon cancer Neg Hx   . Liver disease Neg Hx   . Lung cancer Father 7    deceased  . Cancer Father   . Heart failure Mother 70    deceased  . Heart disease Mother   . Diabetes Brother      Prior to Admission medications   Medication Sig Start Date End Date Taking? Authorizing Provider  ALPRAZolam Duanne Moron) 0.25 MG tablet Take 0.25 mg by mouth daily as needed for anxiety.    Yes Historical Provider, MD  amLODipine (NORVASC) 2.5 MG tablet TAKE ONE TABLET BY MOUTH ONCE DAILY IN THE MORNING 07/05/15  Yes Minus Breeding, MD  anastrozole (ARIMIDEX) 1 MG tablet Take 1 tablet (1 mg total) by mouth daily. 06/20/15  Yes Laurie Panda, NP  aspirin EC 81 MG tablet Take 81 mg by mouth daily.   Yes Historical Provider, MD  atorvastatin (LIPITOR) 40 MG tablet TAKE ONE AND ONE-HALF TABLETS BY MOUTH IN THE EVENING 08/22/15  Yes Evans Lance, MD  calcium-vitamin D (OSCAL WITH D) 500-200 MG-UNIT per tablet Take 1 tablet by mouth 2 (two) times daily.   Yes Historical Provider, MD  ciprofloxacin (CIPRO) 500 MG tablet Take 1 tablet (500 mg total) by mouth 2 (two) times daily. 08/15/15  Yes Charlynne Cousins, MD  dofetilide (TIKOSYN) 250 MCG capsule Take 1 capsule (250 mcg total) by mouth 2 (two) times daily. 07/28/14  Yes Minus Breeding, MD  ibuprofen (ADVIL,MOTRIN) 200 MG tablet Take 200-400 mg by mouth daily as needed. For pain   Yes Historical Provider, MD  lisinopril (PRINIVIL,ZESTRIL) 40 MG  tablet Take 40 mg by mouth every morning.    Yes Historical Provider, MD  magnesium oxide (MAG-OX) 400 MG tablet Take 400 mg twice a day with food 08/17/14  Yes Minus Breeding, MD  metFORMIN (GLUMETZA) 500 MG (MOD) 24 hr tablet Take 500 mg by mouth at bedtime.    Yes Historical Provider, MD  niacin (NIASPAN) 500 MG CR tablet Take 1 tablet (500 mg total) by mouth at bedtime. 08/11/14  Yes Minus Breeding, MD  nitroGLYCERIN (NITROSTAT) 0.4 MG SL tablet Place 1 tablet (0.4 mg total) under the tongue every 5 (five) minutes as needed for chest pain. 06/30/15  Yes Minus Breeding, MD  pantoprazole (PROTONIX) 40 MG tablet Take 40 mg by mouth daily.   Yes Historical Provider, MD  potassium chloride SA (K-DUR,KLOR-CON) 20 MEQ tablet Take 20  mEq by mouth 2 (two) times daily.     Yes Historical Provider, MD  predniSONE (DELTASONE) 10 MG tablet  06/27/15  Yes Historical Provider, MD  warfarin (COUMADIN) 5 MG tablet Take 5 mg by mouth daily. Take 5 mg everyday.   Yes Historical Provider, MD   Physical Exam: Filed Vitals:   08/30/15 1030 08/30/15 1100 08/30/15 1300 08/30/15 1345  BP: 98/50 96/64 120/55 120/63  Pulse:  65  86  Temp:      Resp: 18 20    Height:      Weight:      SpO2:  98% 95% 94%    Wt Readings from Last 3 Encounters:  08/30/15 81.647 kg (180 lb)  08/18/15 83.553 kg (184 lb 3.2 oz)  08/15/15 85.548 kg (188 lb 9.6 oz)    General:  Appears somewhat irritable and uncomfortable, obese Eyes: PERRL, normal lids, irises & conjunctiva ENT: grossly normal hearing, lips & tongue, mucous membranes of her mouth slightly dry Neck: no LAD, masses or thyromegaly Cardiovascular: RRR, no m/r/g. No LE edema. 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 Normal respiratory effort. Breath sounds somewhat distant Abdomen: soft, nondistended positive bowel sounds but sluggish mild to moderate tenderness to palpation particularly in the lower  quadrants no guarding Skin: no rash or induration seen on limited exam Musculoskeletal: grossly normal tone BUE/BLE Psychiatric: grossly normal mood and affect, speech fluent and appropriate Neurologic: grossly non-focal. Speech clear           Labs on Admission:  Basic Metabolic Panel:  Recent Labs Lab 08/30/15 0926  NA 128*  K 4.0  CL 101  CO2 22  GLUCOSE 170*  BUN 31*  CREATININE 1.25*  CALCIUM 7.8*   Liver Function Tests:  Recent Labs Lab 08/30/15 0926  AST 20  ALT 12*  ALKPHOS 65  BILITOT 0.9  PROT 6.3*  ALBUMIN 2.5*    Recent Labs Lab 08/30/15 0926  LIPASE 28   No results for input(s): AMMONIA in the last 168 hours. CBC:  Recent Labs Lab 08/30/15 0926  WBC 15.2*  NEUTROABS 12.2*  HGB 8.6*  HCT 27.5*  MCV 73.1*  PLT 264   Cardiac Enzymes: No results for input(s): CKTOTAL, CKMB, CKMBINDEX, TROPONINI in the last 168 hours.  BNP (last 3 results)  Recent Labs  08/13/15 1155  BNP 562.7*    ProBNP (last 3 results) No results for input(s): PROBNP in the last 8760 hours.  CBG: No results for input(s): GLUCAP in the last 168 hours.  Radiological Exams on Admission: Ct Abdomen Pelvis W Contrast  08/30/2015   CLINICAL DATA:  Left lower quadrant pain for 3 days.  EXAM: CT ABDOMEN AND PELVIS WITH CONTRAST  TECHNIQUE: Multidetector CT imaging of the abdomen and pelvis was performed using the standard protocol following bolus administration of intravenous contrast.  CONTRAST:  128m OMNIPAQUE IOHEXOL 300 MG/ML SOLN, 238mOMNIPAQUE IOHEXOL 300 MG/ML SOLN  COMPARISON:  CT abdomen pelvis dated 12/20/2014.  FINDINGS: Lower chest: Mild hypoventilatory changes versus scarring in the lung bases. Heavy atherosclerotic disease of the coronary arteries. Calcifications of the aortic valve.  Peritoneum: No free air or free fluid.  Liver: There is a hypoechoic microlobulated mass within the inferior right lobe of the liver measuring 3.0 x 2.3 by 2.1 cm.No  intrahepatic biliary ductal dilatation.  Gallbladder: Unremarkable. No calcified gallstone. No pericholecystic fluid.  Pancreas: Unremarkable.No ductal dilatation.  Spleen: Unremarkable.  Adrenals  glands: Unremarkable.  Kidneys, ureters, urinary bladder: No hydronephrosis. There is a cortical scarring of the right lower renal pole. Multiloculated cystic structure is seen occupying the right superolateral renal cortex measuring 2.5 by 2.1 x 2.4 cm, stable. There is also a tiny left inferior pole renal cyst measuring 2.2 cm in greatest dimension. 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  Reproductive: Leiomyomatous uterus, with multiple partially calcified uterine fibroids.  Bowel and appendix: No bowel obstruction.There is a long segment of wall thickening of the sigmoid colon, which is redundant and contains a moderate to large amount of formed stool. There is also a small amount of free fluid within the left pericolic gutter, tracking into the pelvis. There are multiple scattered diverticula affecting heavily the left colon.  Vascular/Lymphatic: Again seen is infrarenal abdominal aortic aneurysm, in the background of heavy atherosclerotic disease of the aorta. The aneurysmal sac measures 3.3 cm in maximum transverse dimension. Atherosclerotic disease of the main abdominal arterial branches is also noted.No lymphadenopathy.  Abdominal wall/Musculoskeletal: Unremarkable.  Osseous structures: No worrisome osseous lesions identified. 3 mm calcific focus within the right ilium noted. There is a dextroconvex lumbosacral spine scoliosis with associated osteoarthritic changes. Moderate to severe posterior facet arthropathy is also seen in the lower lumbosacral spine.  IMPRESSION: Long segment of wall thickening of the sigmoid colon, in the background of multiple diverticula, with associated  small amount of free fluid in the left pericolic gutter. Findings suggestive of diverticulitis, or other form of colitis involving the sigmoid colon. Correlation with colonoscopy results, after resolution of the acute symptoms may be considered to exclude underlying malignancy, although one is not seen on this exam, without bowel preparation.  Bilateral stable in appearance renal cysts, and right renal cortical scarring.  Stable in appearance infrarenal abdominal aortic aneurysm, in the background of heavy atherosclerotic disease of the aorta. Recommend followup by ultrasound in 3 years. This recommendation follows ACR consensus guidelines: White Paper of the ACR Incidental Findings Committee II on Vascular Findings. 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  Leiomyomatous uterus.  3 mm stable sclerotic focus within the right ilium. In the absence of history of malignancy this likely represents a bone island.  Atherosclerotic disease of the coronary arteries.   Electronically Signed   By: Fidela Salisbury M.D.   On: 08/30/2015 12:12    EKG: Independently reviewed sinus rhythm  Assessment/Plan Principal Problem:   Diverticulitis: Per abdominal CT. History of same. Denies any diarrhea. Will admit to telemetry. She is afebrile but does have leukocytosis. Will continue Cipro and Flagyl that  was initiated in the emergency department. She had a colonoscopy last year which yielded universal colonic diverticulosis otherwise normal. Will keep nothing by mouth except sips with meds. Provide supportive therapy in the form of analgesia and anti emetic.  Active Problems:  Acute renal insufficiency: Likely related to above. Will provide gentle IV fluids hold any nephrotoxins monitor urine output and recheck in the morning. If no improvement will consider renal ultrasound.    Hyponatremia: Mild likely related to decreased oral intake over the last 2 days. IV fluids as noted  above. Will recheck in the morning    Essential hypertension: Somewhat soft on presentation. Improved control the time of my exam. Home medications include Norvasc lisinopril and I will hold these for now. Monitor closely    CAD (  coronary artery disease): Status post stent and MI in 2012. No chest pain. Will hold her aspirin for now as well as her statin    Atrial fibrillation: Rate controlled. INR 2.45. Wife pharmacy to dose Coumadin    AAA (abdominal aortic aneurysm) without rupture: Stable per CT.  Breast cancer.  Remote. Status post lumpectomy. Continue home meds   Diabetes type 2. She is on metformin at home I will hold this. Will obtain a hemoglobin A1c and use sliding scale insulin for optimal control   Code Status: full DVT Prophylaxis: Family Communication: none present Disposition Plan: home when ready  Time spent: 56 minutes  [facility] Pager [phone]

## 2015-08-30 NOTE — ED Notes (Signed)
Pt c/o llq abd pain x 3 days. Denies n/v/d. Pt taking po zofran, cipro and tramadol at home with no relief.

## 2015-08-30 NOTE — Progress Notes (Signed)
Patient's HR increased to 140's, rhythm A-Fib on monitor, no distress noted, BP 104/61, Diltiazem '5mg'$  IV given per orders K. Black, NP, will continue to monitor.

## 2015-08-31 ENCOUNTER — Inpatient Hospital Stay (HOSPITAL_COMMUNITY): Payer: Medicare Other

## 2015-08-31 ENCOUNTER — Ambulatory Visit: Payer: Medicare Other | Admitting: Pharmacist Clinician (PhC)/ Clinical Pharmacy Specialist

## 2015-08-31 DIAGNOSIS — N179 Acute kidney failure, unspecified: Secondary | ICD-10-CM

## 2015-08-31 LAB — GLUCOSE, CAPILLARY
GLUCOSE-CAPILLARY: 106 mg/dL — AB (ref 65–99)
GLUCOSE-CAPILLARY: 106 mg/dL — AB (ref 65–99)
Glucose-Capillary: 137 mg/dL — ABNORMAL HIGH (ref 65–99)
Glucose-Capillary: 141 mg/dL — ABNORMAL HIGH (ref 65–99)

## 2015-08-31 LAB — URINE MICROSCOPIC-ADD ON

## 2015-08-31 LAB — URINALYSIS, ROUTINE W REFLEX MICROSCOPIC
BILIRUBIN URINE: NEGATIVE
GLUCOSE, UA: NEGATIVE mg/dL
Hgb urine dipstick: NEGATIVE
KETONES UR: NEGATIVE mg/dL
Leukocytes, UA: NEGATIVE
Nitrite: NEGATIVE
PH: 6 (ref 5.0–8.0)
Protein, ur: 30 mg/dL — AB
Specific Gravity, Urine: 1.025 (ref 1.005–1.030)
Urobilinogen, UA: 0.2 mg/dL (ref 0.0–1.0)

## 2015-08-31 LAB — CBC
HCT: 25.4 % — ABNORMAL LOW (ref 36.0–46.0)
Hemoglobin: 8.2 g/dL — ABNORMAL LOW (ref 12.0–15.0)
MCH: 23.5 pg — AB (ref 26.0–34.0)
MCHC: 32.3 g/dL (ref 30.0–36.0)
MCV: 72.8 fL — AB (ref 78.0–100.0)
PLATELETS: 239 10*3/uL (ref 150–400)
RBC: 3.49 MIL/uL — ABNORMAL LOW (ref 3.87–5.11)
RDW: 19.1 % — AB (ref 11.5–15.5)
WBC: 14.6 10*3/uL — ABNORMAL HIGH (ref 4.0–10.5)

## 2015-08-31 LAB — TSH: TSH: 2.327 u[IU]/mL (ref 0.350–4.500)

## 2015-08-31 LAB — COMPREHENSIVE METABOLIC PANEL
ALT: 12 U/L — AB (ref 14–54)
AST: 22 U/L (ref 15–41)
Albumin: 2.2 g/dL — ABNORMAL LOW (ref 3.5–5.0)
Alkaline Phosphatase: 65 U/L (ref 38–126)
Anion gap: 5 (ref 5–15)
BUN: 25 mg/dL — AB (ref 6–20)
CHLORIDE: 106 mmol/L (ref 101–111)
CO2: 20 mmol/L — AB (ref 22–32)
CREATININE: 0.84 mg/dL (ref 0.44–1.00)
Calcium: 7.5 mg/dL — ABNORMAL LOW (ref 8.9–10.3)
GFR calc Af Amer: >60 mL/min (ref >60)
GLUCOSE: 120 mg/dL — AB (ref 65–99)
Potassium: 3.4 mmol/L — ABNORMAL LOW (ref 3.5–5.1)
SODIUM: 131 mmol/L — AB (ref 135–145)
Total Bilirubin: 0.7 mg/dL (ref 0.3–1.2)
Total Protein: 5.7 g/dL — ABNORMAL LOW (ref 6.5–8.1)

## 2015-08-31 LAB — FERRITIN: Ferritin: 84 ng/mL (ref 11–307)

## 2015-08-31 LAB — IRON AND TIBC
Iron: 8 ug/dL — ABNORMAL LOW (ref 28–170)
SATURATION RATIOS: 3 % — AB (ref 10.4–31.8)
TIBC: 304 ug/dL (ref 250–450)
UIBC: 296 ug/dL

## 2015-08-31 LAB — VITAMIN B12: Vitamin B-12: 236 pg/mL (ref 180–914)

## 2015-08-31 LAB — PROTIME-INR
INR: 2.47 — ABNORMAL HIGH (ref 0.00–1.49)
Prothrombin Time: 26.4 seconds — ABNORMAL HIGH (ref 11.6–15.2)

## 2015-08-31 LAB — FOLATE: Folate: 22.7 ng/mL (ref >5.9)

## 2015-08-31 MED ORDER — ACETAMINOPHEN 325 MG PO TABS: 650.0000 mg | ORAL_TABLET | Freq: Four times a day (QID) | ORAL | Status: DC | PRN

## 2015-08-31 MED ORDER — POTASSIUM CHLORIDE IN NACL 20-0.9 MEQ/L-% IV SOLN
INTRAVENOUS | Status: DC
  Administered 2015-08-31: 12:00:00 via INTRAVENOUS

## 2015-08-31 MED ORDER — WARFARIN SODIUM 5 MG PO TABS
5.0000 mg | ORAL_TABLET | Freq: Once | ORAL | Status: AC
  Administered 2015-08-31: 5 mg via ORAL
  Filled 2015-08-31: qty 1

## 2015-08-31 MED ORDER — DIPHENHYDRAMINE HCL 50 MG/ML IJ SOLN: 12.5000 mg | Freq: Four times a day (QID) | INTRAMUSCULAR | Status: DC | PRN

## 2015-08-31 MED ORDER — SENNOSIDES-DOCUSATE SODIUM 8.6-50 MG PO TABS
1.0000 | ORAL_TABLET | Freq: Every day | ORAL | Status: DC
  Administered 2015-08-31 – 2015-09-03 (×4): 1 via ORAL
  Filled 2015-08-31 (×5): qty 1

## 2015-08-31 MED ORDER — POTASSIUM CHLORIDE IN NACL 40-0.9 MEQ/L-% IV SOLN
INTRAVENOUS | Status: DC
  Administered 2015-08-31 – 2015-09-01 (×2): 125 mL/h via INTRAVENOUS
  Administered 2015-09-03: 70 mL/h via INTRAVENOUS

## 2015-08-31 MED ORDER — SODIUM CHLORIDE 0.9 % IV SOLN: INTRAVENOUS | Status: DC

## 2015-08-31 MED ORDER — IBUPROFEN 400 MG PO TABS
400.0000 mg | ORAL_TABLET | ORAL | Status: DC | PRN
  Administered 2015-08-31: 400 mg via ORAL
  Filled 2015-08-31: qty 1

## 2015-08-31 MED ORDER — POTASSIUM CHLORIDE CRYS ER 20 MEQ PO TBCR: 40.0000 meq | EXTENDED_RELEASE_TABLET | Freq: Once | ORAL | Status: DC

## 2015-08-31 NOTE — Progress Notes (Signed)
ANTICOAGULATION CONSULT NOTE - Initial Consult  Pharmacy Consult for Coumadin Indication: atrial fibrillation  Allergies  Allergen Reactions  . Vancomycin Rash and Shortness Of Breath  . Sulfa Antibiotics Rash    All over rash  . Sulfacetamide Sodium Rash    All over rash  . Acetaminophen Hives  . Banana Other (See Comments)    Unknown  . Oxycodone-Acetaminophen Itching  . Penicillins Other (See Comments)    Pt had so much as a child she's immune to it  . Latex Rash  . Miralax [Polyethylene Glycol] Swelling and Rash    Took [facility] brand developed rash. Patient states she tolerated name brand MiraLax in the past.  . Tape Rash    Patient Measurements: Height: '5\' 8"'$  (172.7 cm) Weight: 180 lb (81.647 kg) IBW/kg (Calculated) : 63.9 Heparin Dosing Weight:   Vital Signs: Temp: 99.5 F (37.5 C) (09/21 0900) Temp Source: Oral (09/21 0900) BP: 109/41 mmHg (09/21 0500) Pulse Rate: 86 (09/21 0900)  Labs:  Recent Labs  08/30/15 0926 08/31/15 0620  HGB 8.6* 8.2*  HCT 27.5* 25.4*  PLT 264 239  LABPROT 26.3* 26.4*  INR 2.45* 2.47*  CREATININE 1.25* 0.84    Estimated Creatinine Clearance: 68.9 mL/min (by C-G formula based on Cr of 0.84).   Medical History: Past Medical History  Diagnosis Date  . HLD (hyperlipidemia)   . HTN (hypertension)   . Arthritis   . Foot deformity     right  . CAD (coronary artery disease)     a. NSTEMI 02/2011: occ mid Cx, DES to OM2, residual nonobst LAD dz.  . Tobacco abuse     stopped smoking 2012  . Diabetes mellitus   . Atrial fibrillation     a. Dx 03/2011 - on tikosyn/coumadin.  . Anxiety   . Diverticulitis of colon 2008 and 04/2011  . Ureteral obstruction     History of gross hematuria/right hydronephrosis 2/2 to uteropelvic junction obstruction, s/p cystoscopy in January 2007 with bilateral retrograde pyelography, right ureter arthroscopy, right ureteral stent placement, bladder biopsies, stent removal since then.  . Heart attack    . Wears dentures     top  . Dysrhythmia     a-fib  . Atrial fibrillation   . Cancer     breast  . Diverticulitis   . Hemangioma     liver    Medications:  Prescriptions prior to admission  Medication Sig Dispense Refill Last Dose  . ALPRAZolam (XANAX) 0.25 MG tablet Take 0.25 mg by mouth daily as needed for anxiety.    Past Week at Unknown time  . amLODipine (NORVASC) 2.5 MG tablet TAKE ONE TABLET BY MOUTH ONCE DAILY IN THE MORNING 30 tablet 6 08/29/2015 at Unknown time  . anastrozole (ARIMIDEX) 1 MG tablet Take 1 tablet (1 mg total) by mouth daily. 90 tablet 3 08/29/2015 at Unknown time  . aspirin EC 81 MG tablet Take 81 mg by mouth daily.   08/29/2015 at Unknown time  . atorvastatin (LIPITOR) 40 MG tablet TAKE ONE AND ONE-HALF TABLETS BY MOUTH IN THE EVENING 135 tablet 1 08/29/2015 at Unknown time  . calcium-vitamin D (OSCAL WITH D) 500-200 MG-UNIT per tablet Take 1 tablet by mouth 2 (two) times daily.   08/29/2015 at Unknown time  . ciprofloxacin (CIPRO) 500 MG tablet Take 1 tablet (500 mg total) by mouth 2 (two) times daily. 12 tablet 0 08/29/2015 at Unknown time  . dofetilide (TIKOSYN) 250 MCG capsule Take 1 capsule (250 mcg  total) by mouth 2 (two) times daily. 60 capsule 11 08/29/2015 at Unknown time  . ibuprofen (ADVIL,MOTRIN) 200 MG tablet Take 200-400 mg by mouth daily as needed. For pain   08/29/2015 at Unknown time  . lisinopril (PRINIVIL,ZESTRIL) 40 MG tablet Take 40 mg by mouth every morning.    08/29/2015 at Unknown time  . magnesium oxide (MAG-OX) 400 MG tablet Take 400 mg twice a day with food 60 tablet 6 08/29/2015 at Unknown time  . metFORMIN (GLUMETZA) 500 MG (MOD) 24 hr tablet Take 500 mg by mouth at bedtime.    08/29/2015 at Unknown time  . niacin (NIASPAN) 500 MG CR tablet Take 1 tablet (500 mg total) by mouth at bedtime. 60 tablet 3 08/29/2015 at Unknown time  . nitroGLYCERIN (NITROSTAT) 0.4 MG SL tablet Place 1 tablet (0.4 mg total) under the tongue every 5 (five) minutes as  needed for chest pain. 25 tablet 5 Unknown  . pantoprazole (PROTONIX) 40 MG tablet Take 40 mg by mouth daily.   08/29/2015 at Unknown time  . potassium chloride SA (K-DUR,KLOR-CON) 20 MEQ tablet Take 20 mEq by mouth 2 (two) times daily.     08/29/2015 at Unknown time  . predniSONE (DELTASONE) 10 MG tablet    08/29/2015 at Unknown time  . warfarin (COUMADIN) 5 MG tablet Take 5 mg by mouth daily. Take 5 mg everyday.   Past Week at Unknown time    Assessment: Continuation of Coumadin PTA for AFIB Past history noted elevated INR 3.18 (08/14/15) at [facility] when on Coumadin 5 mg po daily. Patient discharge summary listed Coumadin 5 mg po daily. Patient on Cipro/Flagyl this admission which may increase INR in combination with Coumadin INR therapeutic on admission, 2.45. INR = 2.47(9/21). If INR starts to increase will adjust dose. Continue with '5mg'$  daily.  Goal of Therapy:  INR 2-3 Monitor platelets by anticoagulation protocol: Yes   Plan:  Coumadin '5mg'$  today INR/PT daily Monitor platelets/CBC  Isac Sarna, BS Vena [place], BCPS Clinical Pharmacist Pager [phone]  08/31/2015,11:23 AM

## 2015-08-31 NOTE — Progress Notes (Signed)
   08/31/15 1300  Vitals  Temp (!) 101.1 F (38.4 C)  Temp Source Axillary  BP (!) 110/50 mmHg  BP Location Left Arm  BP Method Automatic  Patient Position (if appropriate) Lying  Pulse Rate 80  Pulse Rate Source Dinamap  Resp 20  Oxygen Therapy  SpO2 91 %  O2 Device Nasal Cannula  O2 Flow Rate (L/min) 1 L/min   Pt temp 101.1, notified K Black NP.  Order placed for Tylenol PRN with Benadryl PRN for reaction, pt refused medicine at this time.  Removed covers from pt. Will continue to monitor.

## 2015-08-31 NOTE — Progress Notes (Signed)
[facility] PROGRESS NOTE  Melody Casey YBO:[id number] DOB: Apr 26, 1946 DOA: 08/30/2015 PCP: Kenn File, MD  Assessment/Plan:  Diverticulitis: Per abdominal CT. History of same. Continues with intermittent abdominal "cramping". Denies any diarrhea or flatus. Denies nausea.  Max temp 99.8. Leukocytosis improved slightly.  Will continue Cipro and Flagyl day #2 IV. Of note, she had a colonoscopy last year which yielded universal colonic diverticulosis otherwise normal. Will keep nothing by mouth except sips with meds. Provide supportive therapy in the form of analgesia and anti emetic. hope to advance diet tomorrow.  Active Problems:  Atrial fibrillation with RVR: developed after admission.  Rate controlled on admission. Likely related to fact she missed home dose of rate control med. Given one dose of diltiazem and resumed home meds. Improved control.  INR 2.45.  Pharmacy to dose Coumadin  Acute renal insufficiency: Likely related to above. Resolved with IV fluids. Urine output good. Has had some incontinence.    Hyponatremia: Mild likely related to decreased oral intake.. Improving this am. Continue  IV fluids. Will recheck in the morning  Microcytic anemia: anemia panel in process. No s/sx bleeding.    Essential hypertension: remains somewhat soft today. Home medications include Norvasc lisinopril and I will continue to  hold these.. Monitor closely   CAD (coronary artery disease): Status post stent and MI in 2012. No chest pain. Will hold her aspirin for now as well as her statin   AAA (abdominal aortic aneurysm) without rupture: Stable per CT.  Breast cancer. Remote. Status post lumpectomy. Continue home meds  Diabetes type 2. . Will obtain a hemoglobin A1c and use sliding scale insulin for optimal control  RA: appears stable at baseline. Takes NSAIDS with only minimal relief. Low dose prednisone continued.    Code Status: full Family Communication: none  present Disposition Plan: home when ready hopefully 2-3 more days   Consultants:  none  Procedures:  none  Antibiotics:  cipro 08/30/15>>  Flagyl 08/30/15>>  HPI/Subjective: Lying in bed awake. Reports continued "cramping" abdomen. 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  Respiratory: normal effort BS clear bilaterally no wheeze  Abdomen: obese mildly distended but soft sluggish BS very tender LLQ to touch  Musculoskeletal: no clubbing or cyanosis   Data Reviewed: Basic Metabolic Panel:  Recent Labs Lab 08/30/15 0926 08/31/15 0620  NA 128* 131*  K 4.0 3.4*  CL 101 106  CO2 22 20*  GLUCOSE 170* 120*  BUN 31* 25*  CREATININE 1.25* 0.84  CALCIUM 7.8* 7.5*   Liver Function Tests:  Recent Labs Lab 08/30/15 0926 08/31/15 0620  AST 20 22  ALT 12* 12*  ALKPHOS 65 65  BILITOT 0.9 0.7  PROT 6.3* 5.7*  ALBUMIN 2.5* 2.2*    Recent Labs Lab 08/30/15 0926  LIPASE 28   No results for input(s): AMMONIA in the last 168 hours. CBC:  Recent  Labs Lab 08/30/15 0926 08/31/15 0620  WBC 15.2* 14.6*  NEUTROABS 12.2*  --   HGB 8.6* 8.2*  HCT 27.5* 25.4*  MCV 73.1* 72.8*  PLT 264 239   Cardiac Enzymes: No results for input(s): CKTOTAL, CKMB, CKMBINDEX, TROPONINI in the last 168 hours. BNP (last 3 results)  Recent Labs  08/13/15 1155  BNP 562.7*    ProBNP (last 3 results) No results for input(s): PROBNP in the last 8760 hours.  CBG:  Recent Labs Lab 08/30/15 1619  08/30/15 2138 08/31/15 0719 08/31/15 1130  GLUCAP 131* 132* 106* 106*    No results found for this or any previous visit (from the past 240 hour(s)).   Studies: Ct Abdomen Pelvis W Contrast  08/30/2015   CLINICAL DATA:  Left lower quadrant pain for 3 days.  EXAM: CT ABDOMEN AND PELVIS WITH CONTRAST  TECHNIQUE: Multidetector CT imaging of the abdomen and pelvis was performed using the standard protocol following bolus administration of intravenous contrast.  CONTRAST:  156m OMNIPAQUE IOHEXOL 300 MG/ML SOLN, 2108mOMNIPAQUE IOHEXOL 300 MG/ML SOLN  COMPARISON:  CT abdomen pelvis dated 12/20/2014.  FINDINGS: Lower chest: Mild hypoventilatory changes versus scarring in the lung bases. Heavy atherosclerotic disease of the coronary arteries. Calcifications of the aortic valve.  Peritoneum: No free air or free fluid.  Liver: There is a hypoechoic microlobulated mass within the inferior right lobe of the liver measuring 3.0 x 2.3 by 2.1 cm.No intrahepatic biliary ductal dilatation.  Gallbladder: Unremarkable. No calcified gallstone. No pericholecystic fluid.  Pancreas: Unremarkable.No ductal dilatation.  Spleen: Unremarkable.  Adrenals glands: Unremarkable.  Kidneys, ureters, urinary bladder: No hydronephrosis. There is a cortical scarring of the right lower renal pole. Multiloculated cystic structure is seen occupying the right superolateral renal cortex measuring 2.5 by 2.1 x 2.4 cm, stable. There is also a tiny left inferior pole renal cyst measuring 2.2 cm in greatest dimension. 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  Reproductive: Leiomyomatous uterus, with multiple partially calcified uterine fibroids.  Bowel and appendix: No bowel obstruction.There is a long segment of wall thickening of the sigmoid colon, which is redundant and contains a moderate to large amount of formed  stool. There is also a small amount of free fluid within the left pericolic gutter, tracking into the pelvis. There are multiple scattered diverticula affecting heavily the left colon.  Vascular/Lymphatic: Again seen is infrarenal abdominal aortic aneurysm, in the background of heavy atherosclerotic disease of the aorta. The aneurysmal sac measures 3.3 cm in maximum transverse dimension. Atherosclerotic disease of the main abdominal arterial branches is also noted.No lymphadenopathy.  Abdominal wall/Musculoskeletal: Unremarkable.  Osseous structures: No worrisome osseous lesions identified. 3 mm calcific focus within the right ilium noted. There is a dextroconvex lumbosacral spine scoliosis with associated osteoarthritic changes. Moderate to severe posterior facet arthropathy is also seen in the lower lumbosacral spine.  IMPRESSION: Long segment of wall thickening of the sigmoid colon, in the background of multiple diverticula, with associated small amount of free fluid in the left pericolic gutter. Findings suggestive of diverticulitis, or other form of colitis involving the sigmoid colon. Correlation with colonoscopy results, after resolution of the acute symptoms may be considered to exclude underlying malignancy, although one is not seen on this exam, without bowel preparation.  Bilateral stable in appearance renal cysts, and right renal cortical scarring.  Stable in appearance infrarenal abdominal aortic aneurysm, in the  background of heavy atherosclerotic disease of the aorta. Recommend followup by ultrasound in 3 years. This recommendation follows ACR consensus guidelines: White Paper of the ACR Incidental Findings Committee II on Vascular Findings. J Am Coll Radiol 2013; 44:034-742  Stable in appearance right hepatic hemangioma.  Leiomyomatous uterus.  3 mm stable sclerotic focus within the right ilium. In the absence of history of malignancy this likely represents a bone island.  Atherosclerotic disease of  the coronary arteries.   Electronically Signed   By: Fidela Salisbury M.D.   On: 08/30/2015 12:12    Scheduled Meds: . anastrozole  1 mg Oral Daily  . ciprofloxacin  400 mg Intravenous Q12H  . dofetilide  250 mcg Oral BID  . insulin aspart  0-15 Units Subcutaneous TID WC  . insulin aspart  0-5 Units Subcutaneous QHS  . metronidazole  500 mg Intravenous Q8H  . pantoprazole (PROTONIX) IV  40 mg Intravenous QHS  . predniSONE  10 mg Oral q morning - 10a  . sodium chloride  3 mL Intravenous Q12H  . warfarin  5 mg Oral Once  . Warfarin - Pharmacist Dosing Inpatient   Does not apply Q24H   Continuous Infusions: . 0.9 % NaCl with KCl 20 mEq / L      Principal Problem:   Acute diverticulitis Active Problems:   CAD (coronary artery disease)   Atrial fibrillation with RVR   AAA (abdominal aortic aneurysm) without rupture   Acute kidney injury   Hyponatremia   Microcytic anemia    Time spent: 35 minutes    [facility] Pager [phone]. If 7PM-7AM, please contact night-coverage at [email or web address], password [facility] 08/31/2015, 11:53 AM  LOS: 1 day

## 2015-08-31 NOTE — Care Management Note (Signed)
Case Management Note  Patient Details  Name: Melody Casey MRN: [id number] Date of Birth: Oct 30, 1946  Subjective/Objective:                  Pt admitted from home with diverticulitis. Pt lives with husband and will return home at discharge. Pt stated that she is fairly independent with ADL's.  Action/Plan: Pt was not feeling well and would like to CM to return another day to discuss discharge planning. Will continue to follow pt for discharge planning needs.  Expected Discharge Date:                  Expected Discharge Plan:  [place]  In-House Referral:  NA  Discharge planning Services  CM Consult  Post Acute Care Choice:  NA Choice offered to:  NA  DME Arranged:    DME Agency:     HH Arranged:    HH Agency:     Status of Service:  In process, will continue to follow  Medicare Important Message Given:    Date Medicare IM Given:    Medicare IM give by:    Date Additional Medicare IM Given:    Additional Medicare Important Message give by:     If discussed at [place] of Stay Meetings, dates discussed:    Additional Comments:  Joylene Draft, RN 08/31/2015, 1:59 PM

## 2015-09-01 ENCOUNTER — Encounter (HOSPITAL_COMMUNITY): Payer: Self-pay | Admitting: Gastroenterology

## 2015-09-01 DIAGNOSIS — D509 Iron deficiency anemia, unspecified: Secondary | ICD-10-CM

## 2015-09-01 DIAGNOSIS — K5792 Diverticulitis of intestine, part unspecified, without perforation or abscess without bleeding: Secondary | ICD-10-CM

## 2015-09-01 LAB — CBC WITH DIFFERENTIAL/PLATELET
Basophils Absolute: 0 10*3/uL (ref 0.0–0.1)
Basophils Relative: 0 %
Eosinophils Absolute: 0.1 10*3/uL (ref 0.0–0.7)
Eosinophils Relative: 1 %
HCT: 30.6 % — ABNORMAL LOW (ref 36.0–46.0)
HEMOGLOBIN: 9.8 g/dL — AB (ref 12.0–15.0)
LYMPHS ABS: 1.2 10*3/uL (ref 0.7–4.0)
LYMPHS PCT: 12 %
MCH: 23.9 pg — AB (ref 26.0–34.0)
MCHC: 32 g/dL (ref 30.0–36.0)
MCV: 74.6 fL — AB (ref 78.0–100.0)
Monocytes Absolute: 0.7 10*3/uL (ref 0.1–1.0)
Monocytes Relative: 7 %
NEUTROS ABS: 7.8 10*3/uL — AB (ref 1.7–7.7)
NEUTROS PCT: 80 %
Platelets: 256 10*3/uL (ref 150–400)
RBC: 4.1 MIL/uL (ref 3.87–5.11)
RDW: 20 % — ABNORMAL HIGH (ref 11.5–15.5)
WBC: 9.7 10*3/uL (ref 4.0–10.5)

## 2015-09-01 LAB — URINE MICROSCOPIC-ADD ON

## 2015-09-01 LAB — BASIC METABOLIC PANEL
BUN: 28 mg/dL — ABNORMAL HIGH (ref 6–20)
CO2: 20 mmol/L — AB (ref 22–32)
Calcium: 7.8 mg/dL — ABNORMAL LOW (ref 8.9–10.3)
Chloride: 111 mmol/L (ref 101–111)
Creatinine, Ser: 0.73 mg/dL (ref 0.44–1.00)
GLUCOSE: 82 mg/dL (ref 65–99)
POTASSIUM: 4.4 mmol/L (ref 3.5–5.1)
Sodium: 132 mmol/L — ABNORMAL LOW (ref 135–145)

## 2015-09-01 LAB — URINALYSIS, ROUTINE W REFLEX MICROSCOPIC
BILIRUBIN URINE: NEGATIVE
Glucose, UA: NEGATIVE mg/dL
KETONES UR: NEGATIVE mg/dL
NITRITE: NEGATIVE
PH: 6 (ref 5.0–8.0)
Specific Gravity, Urine: 1.025 (ref 1.005–1.030)
Urobilinogen, UA: 0.2 mg/dL (ref 0.0–1.0)

## 2015-09-01 LAB — CBC
HEMATOCRIT: 23.7 % — AB (ref 36.0–46.0)
HEMOGLOBIN: 7.7 g/dL — AB (ref 12.0–15.0)
MCH: 23.8 pg — ABNORMAL LOW (ref 26.0–34.0)
MCHC: 32.5 g/dL (ref 30.0–36.0)
MCV: 73.4 fL — ABNORMAL LOW (ref 78.0–100.0)
Platelets: 234 10*3/uL (ref 150–400)
RBC: 3.23 MIL/uL — AB (ref 3.87–5.11)
RDW: 19.5 % — ABNORMAL HIGH (ref 11.5–15.5)
WBC: 11.3 10*3/uL — AB (ref 4.0–10.5)

## 2015-09-01 LAB — HEMOGLOBIN A1C
HEMOGLOBIN A1C: 7.5 % — AB (ref 4.8–5.6)
MEAN PLASMA GLUCOSE: 169 mg/dL

## 2015-09-01 LAB — PROTIME-INR
INR: 2.96 — AB (ref 0.00–1.49)
Prothrombin Time: 30.3 seconds — ABNORMAL HIGH (ref 11.6–15.2)

## 2015-09-01 LAB — GLUCOSE, CAPILLARY
GLUCOSE-CAPILLARY: 87 mg/dL (ref 65–99)
Glucose-Capillary: 69 mg/dL (ref 65–99)
Glucose-Capillary: 93 mg/dL (ref 65–99)
Glucose-Capillary: 117 mg/dL — ABNORMAL HIGH (ref 65–99)

## 2015-09-01 LAB — PREPARE RBC (CROSSMATCH)

## 2015-09-01 LAB — OCCULT BLOOD X 1 CARD TO LAB, STOOL: Fecal Occult Bld: NEGATIVE

## 2015-09-01 LAB — ABO/RH: ABO/RH(D): B POS

## 2015-09-01 MED ORDER — WARFARIN SODIUM 1 MG PO TABS
1.0000 mg | ORAL_TABLET | Freq: Once | ORAL | Status: AC
  Administered 2015-09-01: 1 mg via ORAL
  Filled 2015-09-01: qty 1

## 2015-09-01 MED ORDER — GLYCERIN (LAXATIVE) 2.1 G RE SUPP
1.0000 | Freq: Once | RECTAL | Status: DC
  Filled 2015-09-01: qty 1

## 2015-09-01 MED ORDER — SODIUM CHLORIDE 0.9 % IV SOLN
Freq: Once | INTRAVENOUS | Status: AC
  Administered 2015-09-01: 17:00:00 via INTRAVENOUS

## 2015-09-01 NOTE — Progress Notes (Signed)
[facility] PROGRESS NOTE  IVIANA BLASINGAME WIO:[id number] DOB: Mar 11, 1946 DOA: 08/30/2015 PCP: Kenn File, MD  Assessment/Plan: Diverticulitis: Per abdominal CT. History of same. Reports slightly less pain. Frequent flatus and 1 BM. Denies nausea. Max temp 101.1 08/31/15. Urinalysis unremarkable. Chest xray with question mediastinal adenopathy or vascular abnormality. Minimal subsegmental atelectasis at the right lung base. Shallow inflation.  Leukocytosis continues to improve. Blood cultures pending. Will continue Cipro and Flagyl day #3 IV. Will provide clear liquids. Start incentive spirometry.  Of note, she had a colonoscopy last year which yielded universal colonic diverticulosis otherwise normal.   Active Problems: Atrial fibrillation with RVR: resolved.  developed after admission.remains rate controlled.. Likely related to fact she missed home dose of rate control med. TSH within limits of normal. INR 2.96. Pharmacy to dose Coumadin  Acute renal insufficiency: Likely related to above. Resolved with IV fluids. Urine output good. Has had some incontinence.    Hyponatremia: Mild likely related to decreased oral intake. Continues to improve.  Continue IV fluids. Will monitor  Microcytic anemia: continues to drift down. May be somewhat dilutional. Chart review indicates Hg 11.1 8 months ago. Range 8.5-10 during recent hospitalization. anemia panel revealed B12 236, Iron 8 and ferritin 84. Await FOBT.Marland Kitchen Continue protonix. Will request GI consult.    Essential hypertension: remains soft. Has taken more pain medicine last 24 hours.  Home medications include Norvasc lisinopril and I will continue to hold these.. Monitor closely   CAD (coronary artery disease): Status post stent and MI in 2012. No chest pain. Will hold her aspirin for now as well as her statin   AAA (abdominal aortic aneurysm) without rupture: Stable per CT.  Breast cancer. Remote. Status post lumpectomy.  Continue home meds  Diabetes type 2. . Will obtain a hemoglobin A1c and use sliding scale insulin for optimal control  RA: appears stable at baseline. Takes NSAIDS with only minimal relief. Low dose prednisone continued.     Code Status: full Family Communication: spoke with daughter Little Ishikawa phone # [phone].  Disposition Plan: home when ready   Consultants:  gastroenterology  Procedures:  none  Antibiotics:  cipro 08/30/15>>  Flagyl 08/30/15>>    HPI/Subjective: Sitting on side of bed. 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 No rebounding  Musculoskeletal: joints without clubbing or cyanosis   Data Reviewed: Basic Metabolic Panel:  Recent Labs Lab 08/30/15 0926 08/31/15 0620 09/01/15 0638  NA 128* 131* 132*  K 4.0 3.4* 4.4  CL 101 106 111  CO2 22 20* 20*  GLUCOSE 170* 120* 82  BUN 31* 25* 28*  CREATININE 1.25* 0.84 0.73  CALCIUM 7.8* 7.5* 7.8*   Liver Function Tests:  Recent Labs Lab 08/30/15 0926 08/31/15 0620  AST 20 22  ALT 12* 12*  ALKPHOS 65 65  BILITOT 0.9 0.7  PROT 6.3* 5.7*  ALBUMIN 2.5* 2.2*    Recent Labs Lab 08/30/15 0926  LIPASE 28   No results for input(s): AMMONIA in the last  168 hours. CBC:  Recent Labs Lab 08/30/15 0926 08/31/15 0620 09/01/15 0638  WBC 15.2* 14.6* 11.3*  NEUTROABS 12.2*  --   --   HGB 8.6* 8.2* 7.7*  HCT 27.5* 25.4* 23.7*  MCV 73.1* 72.8* 73.4*  PLT 264 239 234   Cardiac Enzymes: No results for input(s): CKTOTAL, CKMB, CKMBINDEX, TROPONINI in the last 168 hours. BNP (last 3 results)  Recent Labs  08/13/15 1155  BNP 562.7*    ProBNP (last 3 results) No results for input(s): PROBNP in the last 8760 hours.  CBG:  Recent Labs Lab 08/31/15 0719 08/31/15 1130 08/31/15 1622 08/31/15 2048 09/01/15 0751  GLUCAP 106* 106* 137* 141* 69    No results found for this or any previous visit (from the past 240 hour(s)).   Studies: Dg Chest 1 View  08/31/2015   CLINICAL DATA:  PT c/o fever and intermittent SOB today. Pt is currently admitted due to diverticulitis. Hx HTN, CAD, diabetes, A-fib, breast CA, former smoker  EXAM: CHEST  1 VIEW  COMPARISON:  Multiple prior chest x-ray exams including 08/14/2015 and 05/07/2002  FINDINGS: Heart is normal in size. There is shallow lung inflation. Mild prominence of interstitial markings is stable over time. There is minimal subsegmental atelectasis at the right lung base. There are no focal consolidations or pleural effusions.  There is right paratracheal density, increased over prior studies. Slight lobulation of the aortic knob. Findings raise the question of vascular abnormality or possible adenopathy. Visualized osseous structures have a normal appearance.  IMPRESSION: 1. Question mediastinal adenopathy or vascular abnormality. 2. Further evaluation CT of the chest with contrast is recommended. 3. Minimal subsegmental atelectasis at the right lung base. Shallow inflation.   Electronically Signed   By: Nolon Nations M.D.   On: 08/31/2015 16:22   Ct Abdomen Pelvis W Contrast  08/30/2015   CLINICAL DATA:  Left lower quadrant pain for 3 days.  EXAM: CT ABDOMEN AND PELVIS WITH CONTRAST  TECHNIQUE:  Multidetector CT imaging of the abdomen and pelvis was performed using the standard protocol following bolus administration of intravenous contrast.  CONTRAST:  129m OMNIPAQUE IOHEXOL 300 MG/ML SOLN, 220mOMNIPAQUE IOHEXOL 300 MG/ML SOLN  COMPARISON:  CT abdomen pelvis dated 12/20/2014.  FINDINGS: Lower chest: Mild hypoventilatory changes versus scarring in the lung bases. Heavy atherosclerotic disease of the coronary arteries. Calcifications of the aortic valve.  Peritoneum: No free air or free fluid.  Liver: There is a hypoechoic microlobulated mass within the inferior right lobe of the liver measuring 3.0 x 2.3 by 2.1 cm.No intrahepatic biliary ductal dilatation.  Gallbladder: Unremarkable. No calcified gallstone. No pericholecystic fluid.  Pancreas: Unremarkable.No ductal dilatation.  Spleen: Unremarkable.  Adrenals glands: Unremarkable.  Kidneys, ureters, urinary bladder: No hydronephrosis. There is a cortical scarring of the right lower renal pole. Multiloculated cystic structure is seen occupying the right superolateral renal cortex measuring 2.5 by 2.1 x 2.4 cm, stable. There is also a tiny left inferior pole renal cyst measuring 2.2 cm in greatest dimension. 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  Reproductive: Leiomyomatous uterus, with  multiple partially calcified uterine fibroids.  Bowel and appendix: No bowel obstruction.There is a long segment of wall thickening of the sigmoid colon, which is redundant and contains a moderate to large amount of formed stool. There is also a small amount of free fluid within the left pericolic gutter, tracking into the pelvis. There are multiple scattered diverticula affecting heavily the left colon.  Vascular/Lymphatic: Again seen is infrarenal abdominal aortic aneurysm, in the background of heavy atherosclerotic disease of the aorta.  The aneurysmal sac measures 3.3 cm in maximum transverse dimension. Atherosclerotic disease of the main abdominal arterial branches is also noted.No lymphadenopathy.  Abdominal wall/Musculoskeletal: Unremarkable.  Osseous structures: No worrisome osseous lesions identified. 3 mm calcific focus within the right ilium noted. There is a dextroconvex lumbosacral spine scoliosis with associated osteoarthritic changes. Moderate to severe posterior facet arthropathy is also seen in the lower lumbosacral spine.  IMPRESSION: Long segment of wall thickening of the sigmoid colon, in the background of multiple diverticula, with associated small amount of free fluid in the left pericolic gutter. Findings suggestive of diverticulitis, or other form of colitis involving the sigmoid colon. Correlation with colonoscopy results, after resolution of the acute symptoms may be considered to exclude underlying malignancy, although one is not seen on this exam, without bowel preparation.  Bilateral stable in appearance renal cysts, and right renal cortical scarring.  Stable in appearance infrarenal abdominal aortic aneurysm, in the background of heavy atherosclerotic disease of the aorta. Recommend followup by ultrasound in 3 years. This recommendation follows ACR consensus guidelines: White Paper of the ACR Incidental Findings Committee II on Vascular Findings. J Am Coll Radiol 2013; 50:354-656  Stable in appearance right hepatic hemangioma.  Leiomyomatous uterus.  3 mm stable sclerotic focus within the right ilium. In the absence of history of malignancy this likely represents a bone island.  Atherosclerotic disease of the coronary arteries.   Electronically Signed   By: Fidela Salisbury M.D.   On: 08/30/2015 12:12    Scheduled Meds: . anastrozole  1 mg Oral Daily  . ciprofloxacin  400 mg Intravenous Q12H  . dofetilide  250 mcg Oral BID  . insulin aspart  0-15 Units Subcutaneous TID WC  . insulin aspart  0-5 Units  Subcutaneous QHS  . metronidazole  500 mg Intravenous Q8H  . pantoprazole (PROTONIX) IV  40 mg Intravenous QHS  . predniSONE  10 mg Oral q morning - 10a  . senna-docusate  1 tablet Oral QHS  . sodium chloride  3 mL Intravenous Q12H  . warfarin  1 mg Oral Once  . Warfarin - Pharmacist Dosing Inpatient   Does not apply Q24H   Continuous Infusions: . 0.9 % NaCl with KCl 40 mEq / L 125 mL/hr (09/01/15 0012)    Principal Problem:   Acute diverticulitis Active Problems:   CAD (coronary artery disease)   Atrial fibrillation with RVR   AAA (abdominal aortic aneurysm) without rupture   Acute kidney injury   Hyponatremia   Microcytic anemia    Time spent: 35 minutes    [facility] Pager [phone]. If 7PM-7AM, please contact night-coverage at [email or web address], password [facility] 09/01/2015, 9:30 AM  LOS: 2 days

## 2015-09-01 NOTE — Progress Notes (Signed)
[facility] for Coumadin Indication: atrial fibrillation  Allergies  Allergen Reactions  . Vancomycin Rash and Shortness Of Breath  . Sulfa Antibiotics Rash    All over rash  . Sulfacetamide Sodium Rash    All over rash  . Acetaminophen Hives  . Banana Other (See Comments)    Unknown  . Oxycodone-Acetaminophen Itching  . Penicillins Other (See Comments)    Pt had so much as a child she's immune to it  . Latex Rash  . Miralax [Polyethylene Glycol] Swelling and Rash    Took [facility] brand developed rash. Patient states she tolerated name brand MiraLax in the past.  . Tape Rash   Patient Measurements: Height: '5\' 8"'$  (172.7 cm) Weight: 180 lb (81.647 kg) IBW/kg (Calculated) : 63.9  Vital Signs: Temp: 97.6 F (36.4 C) (09/22 0554) Temp Source: Oral (09/22 0554) BP: 95/44 mmHg (09/22 0554) Pulse Rate: 57 (09/22 0554)  Labs:  Recent Labs  08/30/15 0926 08/31/15 0620 09/01/15 0638  HGB 8.6* 8.2* 7.7*  HCT 27.5* 25.4* 23.7*  PLT 264 239 234  LABPROT 26.3* 26.4* 30.3*  INR 2.45* 2.47* 2.96*  CREATININE 1.25* 0.84 0.73   Estimated Creatinine Clearance: 72.3 mL/min (by C-G formula based on Cr of 0.73).  Medical History: Past Medical History  Diagnosis Date  . HLD (hyperlipidemia)   . HTN (hypertension)   . Arthritis   . Foot deformity     right  . CAD (coronary artery disease)     a. NSTEMI 02/2011: occ mid Cx, DES to OM2, residual nonobst LAD dz.  . Tobacco abuse     stopped smoking 2012  . Diabetes mellitus   . Atrial fibrillation     a. Dx 03/2011 - on tikosyn/coumadin.  . Anxiety   . Diverticulitis of colon 2008 and 04/2011  . Ureteral obstruction     History of gross hematuria/right hydronephrosis 2/2 to uteropelvic junction obstruction, s/p cystoscopy in January 2007 with bilateral retrograde pyelography, right ureter arthroscopy, right ureteral stent placement, bladder biopsies, stent removal since then.  . Heart attack   .  Wears dentures     top  . Dysrhythmia     a-fib  . Atrial fibrillation   . Cancer     breast  . Diverticulitis   . Hemangioma     liver   Medications:  Prescriptions prior to admission  Medication Sig Dispense Refill Last Dose  . ALPRAZolam (XANAX) 0.25 MG tablet Take 0.25 mg by mouth daily as needed for anxiety.    Past Week at Unknown time  . amLODipine (NORVASC) 2.5 MG tablet TAKE ONE TABLET BY MOUTH ONCE DAILY IN THE MORNING 30 tablet 6 08/29/2015 at Unknown time  . anastrozole (ARIMIDEX) 1 MG tablet Take 1 tablet (1 mg total) by mouth daily. 90 tablet 3 08/29/2015 at Unknown time  . aspirin EC 81 MG tablet Take 81 mg by mouth daily.   08/29/2015 at Unknown time  . atorvastatin (LIPITOR) 40 MG tablet TAKE ONE AND ONE-HALF TABLETS BY MOUTH IN THE EVENING 135 tablet 1 08/29/2015 at Unknown time  . calcium-vitamin D (OSCAL WITH D) 500-200 MG-UNIT per tablet Take 1 tablet by mouth 2 (two) times daily.   08/29/2015 at Unknown time  . ciprofloxacin (CIPRO) 500 MG tablet Take 1 tablet (500 mg total) by mouth 2 (two) times daily. 12 tablet 0 08/29/2015 at Unknown time  . dofetilide (TIKOSYN) 250 MCG capsule Take 1 capsule (250 mcg total) by mouth  2 (two) times daily. 60 capsule 11 08/29/2015 at Unknown time  . ibuprofen (ADVIL,MOTRIN) 200 MG tablet Take 200-400 mg by mouth daily as needed. For pain   08/29/2015 at Unknown time  . lisinopril (PRINIVIL,ZESTRIL) 40 MG tablet Take 40 mg by mouth every morning.    08/29/2015 at Unknown time  . magnesium oxide (MAG-OX) 400 MG tablet Take 400 mg twice a day with food 60 tablet 6 08/29/2015 at Unknown time  . metFORMIN (GLUMETZA) 500 MG (MOD) 24 hr tablet Take 500 mg by mouth at bedtime.    08/29/2015 at Unknown time  . niacin (NIASPAN) 500 MG CR tablet Take 1 tablet (500 mg total) by mouth at bedtime. 60 tablet 3 08/29/2015 at Unknown time  . nitroGLYCERIN (NITROSTAT) 0.4 MG SL tablet Place 1 tablet (0.4 mg total) under the tongue every 5 (five) minutes as  needed for chest pain. 25 tablet 5 Unknown  . pantoprazole (PROTONIX) 40 MG tablet Take 40 mg by mouth daily.   08/29/2015 at Unknown time  . potassium chloride SA (K-DUR,KLOR-CON) 20 MEQ tablet Take 20 mEq by mouth 2 (two) times daily.     08/29/2015 at Unknown time  . predniSONE (DELTASONE) 10 MG tablet    08/29/2015 at Unknown time  . warfarin (COUMADIN) 5 MG tablet Take 5 mg by mouth daily. Take 5 mg everyday.   Past Week at Unknown time   Assessment: Continuation of Coumadin PTA for AFIB Past history noted elevated INR 3.18 (08/14/15) at [facility] when on Coumadin 5 mg po daily. Patient discharge summary listed Coumadin 5 mg po daily. Patient on Cipro/Flagyl this admission which may increase INR in combination with Coumadin INR therapeutic on admission, INR trending up today.  H/H trending down.  Guaiac stools pending.  Goal of Therapy:  INR 2-3   Plan:  Coumadin '1mg'$  today INR/PT daily Monitor for signs and symptoms of bleeding.   Pricilla Larsson, [facility] 09/01/2015,9:17 AM

## 2015-09-01 NOTE — Progress Notes (Addendum)
Referring Provider: Rexene Alberts, MD Primary Care Physician:  Kenn File, MD Primary Gastroenterologist:  Barney Drain, MD  Reason for Consultation:  Diverticulitis, anemia  HPI: Melody Casey is a 70 y.o. female with past medical history including diverticulitis, breast cancer, diabetes, A. fib, AAA, TIA, hypertension, CAD status post stent placement 2012, MI in 2012, recent urosepsis due to Escherichia coli who presented to the emergency department with several day history of worsening abdominal pain.  She believes her symptoms began over the weekend. Noted pain particularly in the left lower quadrant but in the entire lower abdomen. Pain constant. Associated with nausea but no vomiting. She reports bowel movements have been normal up until this weekend. Her last BM was on Sunday. Denies any history of melena or rectal bleeding. No significant issues with heartburn. Patient reports several episodes of diverticulitis in the past. She states her last episode was about a year ago. She had a colonoscopy with Dr. Earle Gell back in July 2015 which showed universal colonic diverticulosis. Her prep was adequate. She states this was her first ever colonoscopy. She states they did discuss possibility of elective partial colectomy for recurrent diverticulitis.  CT abdomen pelvis 2 days ago showed long segment of wall thickening of the sigmoid colon with multiple diverticula in the area, findings suggestive of diverticulitis. Stable infrarenal abdominal aortic aneurysm. Hypoechoic microlobulated mass in the inferior right lobe liver measuring 3 x 2.3 x 2.1 cm. Previously determined to be a hemangioma.  Patient had mild leukocytosis at presentation, 15,200. Hemoglobin 8.6. White blood cell count 11,300, hemoglobin 7.7 today. Last temperature yesterday afternoon.   Prior to Admission medications   Medication Sig Start Date End Date Taking? Authorizing Provider  ALPRAZolam Duanne Moron) 0.25 MG tablet  Take 0.25 mg by mouth daily as needed for anxiety.    Yes Historical Provider, MD  amLODipine (NORVASC) 2.5 MG tablet TAKE ONE TABLET BY MOUTH ONCE DAILY IN THE MORNING 07/05/15  Yes Minus Breeding, MD  anastrozole (ARIMIDEX) 1 MG tablet Take 1 tablet (1 mg total) by mouth daily. 06/20/15  Yes Laurie Panda, NP  aspirin EC 81 MG tablet Take 81 mg by mouth daily.   Yes Historical Provider, MD  atorvastatin (LIPITOR) 40 MG tablet TAKE ONE AND ONE-HALF TABLETS BY MOUTH IN THE EVENING 08/22/15  Yes Evans Lance, MD  calcium-vitamin D (OSCAL WITH D) 500-200 MG-UNIT per tablet Take 1 tablet by mouth 2 (two) times daily.   Yes Historical Provider, MD  ciprofloxacin (CIPRO) 500 MG tablet Take 1 tablet (500 mg total) by mouth 2 (two) times daily. 08/15/15  Yes Charlynne Cousins, MD  dofetilide (TIKOSYN) 250 MCG capsule Take 1 capsule (250 mcg total) by mouth 2 (two) times daily. 07/28/14  Yes Minus Breeding, MD  ibuprofen (ADVIL,MOTRIN) 200 MG tablet Take 200-400 mg by mouth daily as needed. For pain   Yes Historical Provider, MD  lisinopril (PRINIVIL,ZESTRIL) 40 MG tablet Take 40 mg by mouth every morning.    Yes Historical Provider, MD  magnesium oxide (MAG-OX) 400 MG tablet Take 400 mg twice a day with food 08/17/14  Yes Minus Breeding, MD  metFORMIN (GLUMETZA) 500 MG (MOD) 24 hr tablet Take 500 mg by mouth at bedtime.    Yes Historical Provider, MD  niacin (NIASPAN) 500 MG CR tablet Take 1 tablet (500 mg total) by mouth at bedtime. 08/11/14  Yes Minus Breeding, MD  nitroGLYCERIN (NITROSTAT) 0.4 MG SL tablet Place 1 tablet (0.4 mg total) under the  tongue every 5 (five) minutes as needed for chest pain. 06/30/15  Yes Minus Breeding, MD  pantoprazole (PROTONIX) 40 MG tablet Take 40 mg by mouth daily.   Yes Historical Provider, MD  potassium chloride SA (K-DUR,KLOR-CON) 20 MEQ tablet Take 20 mEq by mouth 2 (two) times daily.     Yes Historical Provider, MD  predniSONE (DELTASONE) 10 MG tablet  06/27/15  Yes  Historical Provider, MD  warfarin (COUMADIN) 5 MG tablet Take 5 mg by mouth daily. Take 5 mg everyday.   Yes Historical Provider, MD    Current Facility-Administered Medications  Medication Dose Route Frequency Provider Last Rate Last Dose  . 0.9 % NaCl with KCl 40 mEq / L  infusion   Intravenous Continuous Radene Gunning, NP 125 mL/hr at 09/01/15 0012 125 mL/hr at 09/01/15 0012  . ALPRAZolam Duanne Moron) tablet 0.25 mg  0.25 mg Oral Daily PRN Radene Gunning, NP      . anastrozole (ARIMIDEX) tablet 1 mg  1 mg Oral Daily Radene Gunning, NP   1 mg at 09/01/15 0926  . ciprofloxacin (CIPRO) IVPB 400 mg  400 mg Intravenous Q12H Lezlie Octave Black, NP   400 mg at 09/01/15 0145  . dofetilide (TIKOSYN) capsule 250 mcg  250 mcg Oral BID Radene Gunning, NP   250 mcg at 09/01/15 3710  . ibuprofen (ADVIL,MOTRIN) tablet 400 mg  400 mg Oral Q4H PRN Radene Gunning, NP   400 mg at 08/31/15 1358  . insulin aspart (novoLOG) injection 0-15 Units  0-15 Units Subcutaneous TID WC Radene Gunning, NP   2 Units at 08/31/15 1709  . insulin aspart (novoLOG) injection 0-5 Units  0-5 Units Subcutaneous QHS Radene Gunning, NP   0 Units at 08/30/15 2213  . metoprolol (LOPRESSOR) injection 5 mg  5 mg Intravenous Q4H PRN Rexene Alberts, MD      . metroNIDAZOLE (FLAGYL) IVPB 500 mg  500 mg Intravenous Q8H Lezlie Octave Black, NP   500 mg at 09/01/15 0926  . morphine 2 MG/ML injection 2 mg  2 mg Intravenous Q3H PRN Radene Gunning, NP   2 mg at 09/01/15 0514  . ondansetron (ZOFRAN) tablet 4 mg  4 mg Oral Q6H PRN Radene Gunning, NP       Or  . ondansetron [facility]) injection 4 mg  4 mg Intravenous Q6H PRN Lezlie Octave Black, NP      . pantoprazole (PROTONIX) injection 40 mg  40 mg Intravenous QHS Rexene Alberts, MD   40 mg at 08/31/15 2302  . predniSONE (DELTASONE) tablet 10 mg  10 mg Oral q morning - 10a Radene Gunning, NP   10 mg at 08/31/15 1054  . senna-docusate (Senokot-S) tablet 1 tablet  1 tablet Oral QHS Rexene Alberts, MD   1 tablet at 08/31/15 2302  .  sodium chloride 0.9 % injection 3 mL  3 mL Intravenous Q12H Radene Gunning, NP   3 mL at 08/31/15 2302  . warfarin (COUMADIN) tablet 1 mg  1 mg Oral Once Rexene Alberts, MD      . Warfarin - Pharmacist Dosing Inpatient   Does not apply Q24H Radene Gunning, NP        Allergies as of 08/30/2015 - Review Complete 08/30/2015  Allergen Reaction Noted  . Vancomycin Rash and Shortness Of Breath 08/18/2015  . Sulfa antibiotics Rash 10/24/2012  . Sulfacetamide sodium Rash 08/18/2015  . Acetaminophen Hives   . Banana Other (See  Comments) 02/17/2014  . Oxycodone-acetaminophen Itching   . Penicillins Other (See Comments)   . Latex Rash 03/19/2014  . Miralax [polyethylene glycol] Swelling and Rash 06/14/2011  . Tape Rash 03/12/2014    Past Medical History  Diagnosis Date  . HLD (hyperlipidemia)   . HTN (hypertension)   . Arthritis   . Foot deformity     right  . CAD (coronary artery disease)     a. NSTEMI 02/2011: occ mid Cx, DES to OM2, residual nonobst LAD dz.  . Tobacco abuse     stopped smoking 2012  . Diabetes mellitus   . Atrial fibrillation     a. Dx 03/2011 - on tikosyn/coumadin.  . Anxiety   . Diverticulitis of colon      2008, 04/2011, 12/2014, 08/2015  . Ureteral obstruction     History of gross hematuria/right hydronephrosis 2/2 to uteropelvic junction obstruction, s/p cystoscopy in January 2007 with bilateral retrograde pyelography, right ureter arthroscopy, right ureteral stent placement, bladder biopsies, stent removal since then.  . Heart attack   . Wears dentures     top  . Dysrhythmia     a-fib  . Atrial fibrillation   . Cancer     breast  . Diverticulitis   . Hemangioma     liver  . AAA (abdominal aortic aneurysm)     Past Surgical History  Procedure Laterality Date  . Tonsillectomy and adenoidectomy    . Knee arthroscopy      left  . Tubal ligation    . Right leg surgery  age 89    As a child for  ?scleroderma per patient  . Ureteral surgery  2011    rt  ureterostomy-stent  . Coronary stent placement  2012  . Cardiac catheterization  2012    stent  . Breast lumpectomy with needle localization and axillary sentinel lymph node bx Right 03/01/2014    Procedure: BREAST LUMPECTOMY WITH NEEDLE LOCALIZATION AND AXILLARY SENTINEL LYMPH NODE BX;  Surgeon: Edward Jolly, MD;  Location: [facility];  Service: General;  Laterality: Right;  . Colonoscopy with propofol N/A 06/29/2014    Dr. Wynetta Emery: universal diverticulosis    Family History  Problem Relation Age of Onset  . Colon cancer Neg Hx   . Liver disease Neg Hx   . Lung cancer Father 82    deceased  . Cancer Father   . Heart failure Mother 19    deceased  . Heart disease Mother   . Diabetes Brother     Social History   Social History  . Marital Status: Married    Spouse Name: N/A  . Number of Children: 2  . 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 Smoking status: Former Smoker -- 2.00 packs/day for 50 years    Types: Cigarettes    Quit date: 02/08/2011  . Smokeless tobacco: Never Used  . Alcohol Use: No  . Drug Use: No  . Sexual Activity: Not on file   Other Topics Concern  . 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      ROS:  General: Negative for anorexia, weight loss,  chills, fatigue. + weakness. See hpi Eyes: Negative for vision changes.  ENT: Negative for hoarseness, difficulty swallowing , nasal congestion. CV: Negative for chest pain, angina, palpitations, dyspnea on exertion, peripheral edema.  Respiratory: Negative for dyspnea at rest, dyspnea on exertion, cough, sputum, wheezing.  GI: See history of present illness. GU:  Negative for dysuria, hematuria, urinary incontinence, urinary frequency, nocturnal urination.  MS: Negative for joint pain, low back pain.  Derm: Negative for rash or itching.  Neuro: Negative for  weakness, abnormal sensation, seizure, frequent headaches, memory loss, confusion.  Psych: Negative for anxiety, depression, suicidal ideation, hallucinations.  Endo: Negative for unusual weight change.  Heme: Negative for bruising or bleeding. Allergy: Negative for rash or hives.       Physical Examination: Vital signs in last 24 hours: Temp:  [97.6 F (36.4 C)-101.1 F (38.4 C)] 97.6 F (36.4 C) (09/22 0554) Pulse Rate:  [56-80] 57 (09/22 0554) Resp:  [20] 20 (09/22 0554) BP: (90-110)/(44-50) 95/44 mmHg (09/22 0554) SpO2:  [91 %-93 %] 93 % (09/22 0554) Last BM Date: 08/27/15  General: appears older than stated age, well-developed in no acute distress.  Head: Normocephalic, atraumatic.   Eyes: Conjunctiva pink, no icterus. Mouth: Oropharyngeal mucosa moist and pink , no lesions erythema or exudate. Neck: Supple without thyromegaly, masses, or lymphadenopathy.  Lungs: Clear to auscultation bilaterally.  Heart: Regular rate and rhythm, no murmurs rubs or gallops.  Abdomen: Bowel sounds are normal, slight distention in lower abd with moderate tenderness especially LLQ,   no hepatosplenomegaly or masses, no abdominal bruits or    hernia , no rebound or guarding.   Rectal: not performed Extremities: No lower extremity edema, clubbing, deformity.  Neuro: Alert and oriented x 4 , grossly normal neurologically.  Skin: Warm and dry, no rash or jaundice.   Psych: Alert and cooperative, normal mood and affect.        Intake/Output from previous day: 09/21 0701 - 09/22 0700 In: 2311.3 [P.O.:30; I.V.:1881.3; IV Piggyback:400] Out: -  Intake/Output this shift:    Lab Results: CBC  Recent Labs  08/30/15 0926 08/31/15 0620 09/01/15 0638  WBC 15.2* 14.6* 11.3*  HGB 8.6* 8.2* 7.7*  HCT 27.5* 25.4* 23.7*  MCV 73.1* 72.8* 73.4*  PLT 264 239 234   BMET  Recent Labs  08/30/15 0926 08/31/15 0620 09/01/15 0638  NA 128* 131* 132*  K 4.0 3.4* 4.4  CL 101 106 111  CO2 22 20* 20*   GLUCOSE 170* 120* 82  BUN 31* 25* 28*  CREATININE 1.25* 0.84 0.73  CALCIUM 7.8* 7.5* 7.8*   LFT  Recent Labs  08/30/15 0926 08/31/15 0620  BILITOT 0.9 0.7  BILIDIR 0.2  --   IBILI 0.7  --   ALKPHOS 65 65  AST 20 22  ALT 12* 12*  PROT 6.3* 5.7*  ALBUMIN 2.5* 2.2*    Lipase  Recent Labs  08/30/15 0926  LIPASE 28    PT/INR  Recent Labs  08/30/15 0926 08/31/15 0620 09/01/15 0638  LABPROT 26.3* 26.4* 30.3*  INR 2.45* 2.47* 2.96*      Imaging Studies: Dg Chest 1 View  08/31/2015   CLINICAL DATA:  PT c/o fever and intermittent SOB today. Pt is currently admitted due to diverticulitis. Hx HTN, CAD, diabetes, A-fib, breast CA, former smoker  EXAM: CHEST  1 VIEW  COMPARISON:  Multiple prior chest x-ray exams including 08/14/2015 and 05/07/2002  FINDINGS: Heart is normal in size. There is shallow lung inflation. Mild prominence of interstitial markings is stable over time. There is minimal subsegmental atelectasis at the right lung base. There are no focal consolidations or pleural effusions.  There is right paratracheal density, increased over  prior studies. Slight lobulation of the aortic knob. Findings raise the question of vascular abnormality or possible adenopathy. Visualized osseous structures have a normal appearance.  IMPRESSION: 1. Question mediastinal adenopathy or vascular abnormality. 2. Further evaluation CT of the chest with contrast is recommended. 3. Minimal subsegmental atelectasis at the right lung base. Shallow inflation.   Electronically Signed   By: Nolon Nations M.D.   On: 08/31/2015 16:22   Dg Chest 1 View  08/13/2015   CLINICAL DATA:  Short of breath, pneumonia.  Subsequent encounter.  EXAM: CHEST  1 VIEW  COMPARISON:  Radiograph 08/13/2015  FINDINGS: Stable enlarged cardiac silhouette. There is interval increase in fine linear interstitial pattern. No pneumothorax. No focal consolidation.  IMPRESSION: Increased interstitial edema pattern.    Electronically Signed   By: Suzy Bouchard M.D.   On: 08/13/2015 07:09   Ct Head Wo Contrast  08/13/2015   CLINICAL DATA:  Acute onset of generalized weakness. Initial encounter.  EXAM: CT HEAD WITHOUT CONTRAST  TECHNIQUE: Contiguous axial images were obtained from the base of the skull through the vertex without intravenous contrast.  COMPARISON:  None.  FINDINGS: There is no evidence of acute infarction, mass lesion, or intra- or extra-axial hemorrhage on CT.  Prominence of the ventricles and sulci reflects mild to moderate cortical volume loss. Cerebellar atrophy is noted. Scattered periventricular and subcortical white matter change likely reflects small vessel ischemic microangiopathy.  The brainstem and fourth ventricle are within normal limits. The basal ganglia are unremarkable in appearance. The cerebral hemispheres demonstrate grossly normal gray-white differentiation. No mass effect or midline shift is seen.  There is no evidence of fracture; visualized osseous structures are unremarkable in appearance. The orbits are within normal limits. The paranasal sinuses and mastoid air cells are well-aerated. No significant soft tissue abnormalities are seen.  IMPRESSION: 1. No acute intracranial pathology seen on CT. 2. Mild to moderate cortical volume loss and scattered small vessel ischemic microangiopathy.   Electronically Signed   By: Garald Balding M.D.   On: 08/13/2015 02:35   Ct Abdomen Pelvis W Contrast  08/30/2015   CLINICAL DATA:  Left lower quadrant pain for 3 days.  EXAM: CT ABDOMEN AND PELVIS WITH CONTRAST  TECHNIQUE: Multidetector CT imaging of the abdomen and pelvis was performed using the standard protocol following bolus administration of intravenous contrast.  CONTRAST:  136m OMNIPAQUE IOHEXOL 300 MG/ML SOLN, 24mOMNIPAQUE IOHEXOL 300 MG/ML SOLN  COMPARISON:  CT abdomen pelvis dated 12/20/2014.  FINDINGS: Lower chest: Mild hypoventilatory changes versus scarring in the lung bases. Heavy  atherosclerotic disease of the coronary arteries. Calcifications of the aortic valve.  Peritoneum: No free air or free fluid.  Liver: There is a hypoechoic microlobulated mass within the inferior right lobe of the liver measuring 3.0 x 2.3 by 2.1 cm.No intrahepatic biliary ductal dilatation.  Gallbladder: Unremarkable. No calcified gallstone. No pericholecystic fluid.  Pancreas: Unremarkable.No ductal dilatation.  Spleen: Unremarkable.  Adrenals glands: Unremarkable.  Kidneys, ureters, urinary bladder: No hydronephrosis. There is a cortical scarring of the right lower renal pole. Multiloculated cystic structure is seen occupying the right superolateral renal cortex measuring 2.5 by 2.1 x 2.4 cm, stable. There is also a tiny left inferior pole renal cyst measuring 2.2 cm in greatest dimension. 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  Reproductive: Leiomyomatous uterus, with multiple partially calcified uterine fibroids.  Bowel and appendix:  No bowel obstruction.There is a long segment of wall thickening of the sigmoid colon, which is redundant and contains a moderate to large amount of formed stool. There is also a small amount of free fluid within the left pericolic gutter, tracking into the pelvis. There are multiple scattered diverticula affecting heavily the left colon.  Vascular/Lymphatic: Again seen is infrarenal abdominal aortic aneurysm, in the background of heavy atherosclerotic disease of the aorta. The aneurysmal sac measures 3.3 cm in maximum transverse dimension. Atherosclerotic disease of the main abdominal arterial branches is also noted.No lymphadenopathy.  Abdominal wall/Musculoskeletal: Unremarkable.  Osseous structures: No worrisome osseous lesions identified. 3 mm calcific focus within the right ilium noted. There is a dextroconvex lumbosacral spine scoliosis with  associated osteoarthritic changes. Moderate to severe posterior facet arthropathy is also seen in the lower lumbosacral spine.  IMPRESSION: Long segment of wall thickening of the sigmoid colon, in the background of multiple diverticula, with associated small amount of free fluid in the left pericolic gutter. Findings suggestive of diverticulitis, or other form of colitis involving the sigmoid colon. Correlation with colonoscopy results, after resolution of the acute symptoms may be considered to exclude underlying malignancy, although one is not seen on this exam, without bowel preparation.  Bilateral stable in appearance renal cysts, and right renal cortical scarring.  Stable in appearance infrarenal abdominal aortic aneurysm, in the background of heavy atherosclerotic disease of the aorta. Recommend followup by ultrasound in 3 years. This recommendation follows ACR consensus guidelines: White Paper of the ACR Incidental Findings Committee II on Vascular Findings. J Am Coll Radiol 2013; 06:269-485  Stable in appearance right hepatic hemangioma.  Leiomyomatous uterus.  3 mm stable sclerotic focus within the right ilium. In the absence of history of malignancy this likely represents a bone island.  Atherosclerotic disease of the coronary arteries.   Electronically Signed   By: Fidela Salisbury M.D.   On: 08/30/2015 12:12   Dg Chest Port 1 View  08/14/2015   CLINICAL DATA:  Chest pressure and shortness of breath this morning. Intermittent cough.  EXAM: PORTABLE CHEST - 1 VIEW  COMPARISON:  08/13/2015.  FINDINGS: Stable borderline enlarged cardiac silhouette. Decreased prominence of the pulmonary vasculature and interstitial markings. Diffuse osteopenia.  IMPRESSION: Improving changes of congestive heart failure.   Electronically Signed   By: Claudie Revering M.D.   On: 08/14/2015 10:21   Dg Chest Portable 1 View  08/13/2015   CLINICAL DATA:  Acute onset of generalized weakness and shortness of breath. Initial  encounter.  EXAM: PORTABLE CHEST - 1 VIEW  COMPARISON:  Chest radiograph performed 10/24/2012  FINDINGS: The lungs are well-aerated. Mild vascular congestion is noted. Mildly increased interstitial markings on the left side could reflect mild asymmetric interstitial edema or possibly pneumonia. There is no evidence of pleural effusion or pneumothorax.  The cardiomediastinal silhouette is within normal limits. No acute osseous abnormalities are seen.  IMPRESSION: Mild vascular congestion noted. Mildly increased interstitial markings on the left side could reflect mild asymmetric interstitial edema or possibly pneumonia.   Electronically Signed   By: Garald Balding M.D.   On: 08/13/2015 01:28  [4 week]   Impression: 69 year old female with recent hospitalization for urosepsis, multiple chronic medical problems as outlined above, who presented with several day history of left lower quadrant abdominal pain. CT suggested of uncomplicated diverticulitis. She has had at least 3 CT documented episodes, patient has discussed possibility of partial colectomy in the past with her providers, at this time she  does not want to pursue any surgery if possible. Husband recently had back surgery and she does not feel like she can undergo surgery in the immediate future.  She notes very slight improvement yesterday evening but feels like she needs to have a bowel movement. Last BM on Sunday. Moderate amount of stool on CT. Would avoid enema. Trial of glycerin suppository. Avoid MiraLAX given significant allergy.  Microcytic anemia, hemoglobin 11.5 in January 2016. At time of discharge on 08/15/2015, hemoglobin 8.5. 7.7 today. MCV 73.4. Iron and iron saturations low. Ferritin normal in the setting of acute diverticulitis. Cannot rule out trending IDA. Colonoscopy last year as outlined above. Patient denies upper GI symptoms.  Plan: 1. Monitor anemia. Transfuse as needed. 2. Follow-up pending Hemoccults as  available. 3. Management of constipation, avoid enema and MiraLAX. 4. Continue IV Cipro and IV Flagyl. Would anticipate improvement over the next 24-48 hours. 5. Trial of clear liquids.   We would like to thank you for the opportunity to participate in the care of Melody Casey.   Laureen Ochs. Bernarda Caffey [facility] [phone] 9/22/201611:44 AM     LOS: 2 days    Attending note:  Patient seen and examined. Remains fairly tender left lower quadrant. Persisting 6 out of 10 abdominal pain even with morphine. Early satiety with clear liquids. Let's make patient nothing by mouth overnight, continue treatment and reassess tomorrow. Discussed with nursing staff

## 2015-09-01 NOTE — Care Management Note (Signed)
Case Management Note  Patient Details  Name: MILLICENT BLAZEJEWSKI MRN: [id number] Date of Birth: [birth year]  Expected Discharge Date:   09/03/2015               Expected Discharge Plan:  Home/Self Care  In-House Referral:  NA  Discharge planning Services  CM Consult  Post Acute Care Choice:  NA Choice offered to:  NA  DME Arranged:    DME Agency:     HH Arranged:    [place] Agency:     Status of Service:  In process, will continue to follow  Medicare Important Message Given:    Date Medicare IM Given:    Medicare IM give by:    Date Additional Medicare IM Given:    Additional Medicare Important Message give by:     If discussed at [place] of Stay Meetings, dates discussed:    Additional Comments: Pt admitted with diverticulitis. Pt is from home with husband who has recently had back surgery. 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 Pt ind at baseline with no HH services or DME's of her own prior to admission. Pt plans to return home with self care. Pt concerned about her husband and wants to get back home. No CM needs anticipated. Will cont to follow.  Sherald Barge, RN 09/01/2015, 2:25 PM

## 2015-09-02 DIAGNOSIS — I4891 Unspecified atrial fibrillation: Secondary | ICD-10-CM

## 2015-09-02 DIAGNOSIS — R509 Fever, unspecified: Secondary | ICD-10-CM

## 2015-09-02 LAB — BASIC METABOLIC PANEL
Anion gap: 3 — ABNORMAL LOW (ref 5–15)
BUN: 17 mg/dL (ref 6–20)
CO2: 20 mmol/L — AB (ref 22–32)
CREATININE: 0.66 mg/dL (ref 0.44–1.00)
Calcium: 7.8 mg/dL — ABNORMAL LOW (ref 8.9–10.3)
Chloride: 110 mmol/L (ref 101–111)
GFR calc non Af Amer: >60 mL/min (ref >60)
Glucose, Bld: 82 mg/dL (ref 65–99)
Potassium: 4.1 mmol/L (ref 3.5–5.1)
Sodium: 133 mmol/L — ABNORMAL LOW (ref 135–145)

## 2015-09-02 LAB — TYPE AND SCREEN
ABO/RH(D): B POS
ANTIBODY SCREEN: NEGATIVE
UNIT DIVISION: 0

## 2015-09-02 LAB — GLUCOSE, CAPILLARY
GLUCOSE-CAPILLARY: 77 mg/dL (ref 65–99)
GLUCOSE-CAPILLARY: 83 mg/dL (ref 65–99)
GLUCOSE-CAPILLARY: 80 mg/dL (ref 65–99)
Glucose-Capillary: 85 mg/dL (ref 65–99)

## 2015-09-02 LAB — CBC
HCT: 26.5 % — ABNORMAL LOW (ref 36.0–46.0)
Hemoglobin: 8.7 g/dL — ABNORMAL LOW (ref 12.0–15.0)
MCH: 24.4 pg — AB (ref 26.0–34.0)
MCHC: 32.8 g/dL (ref 30.0–36.0)
MCV: 74.4 fL — ABNORMAL LOW (ref 78.0–100.0)
PLATELETS: 217 10*3/uL (ref 150–400)
RBC: 3.56 MIL/uL — AB (ref 3.87–5.11)
RDW: 19.8 % — AB (ref 11.5–15.5)
WBC: 8.2 10*3/uL (ref 4.0–10.5)

## 2015-09-02 LAB — URINE CULTURE: Culture: NO GROWTH

## 2015-09-02 LAB — PROTIME-INR
INR: 3.73 — ABNORMAL HIGH (ref 0.00–1.49)
PROTHROMBIN TIME: 36 s — AB (ref 11.6–15.2)

## 2015-09-02 MED ORDER — MORPHINE SULFATE (PF) 2 MG/ML IV SOLN
1.0000 mg | INTRAVENOUS | Status: DC | PRN
  Administered 2015-09-02 – 2015-09-03 (×5): 1 mg via INTRAVENOUS
  Filled 2015-09-02 (×5): qty 1

## 2015-09-02 NOTE — Care Management Note (Signed)
Case Management Note  Patient Details  Name: [name] MRN: [id number] Date of Birth: 1946/08/25  Subjective/Objective:                    Action/Plan:   Expected Discharge Date:                  Expected Discharge Plan:  Home/Self Care  In-House Referral:  NA  Discharge planning Services  CM Consult  Post Acute Care Choice:  NA Choice offered to:  NA  DME Arranged:    DME Agency:     HH Arranged:    [place] Agency:     Status of Service:  Completed, signed off  Medicare Important Message Given:  Yes-second notification given Date Medicare IM Given:    Medicare IM give by:    Date Additional Medicare IM Given:    Additional Medicare Important Message give by:     If discussed at [place] of Stay Meetings, dates discussed:    Additional Comments: Advancing pts diet. Anticipate discharge within 24 hours as long as pt able to tolerate diet. Christinia Gully [place], RN 09/02/2015, 12:15 PM

## 2015-09-02 NOTE — Progress Notes (Addendum)
Subjective: Clinically improved. States "I'm feeling better today." Pain has decreased, currently no pain, N/V. Is going to reduce morphine use. Had a bowel movement yesterday and a small one again this morning. Denies hematochezia/melena. About to ambulate in the hall with CNA. States she will want to have surgery as previously discussed once her husband recovers from his surgery and is able to help her.  Objective: Vital signs in last 24 hours: Temp:  [97.8 F (36.6 C)-100 F (37.8 C)] 98.4 F (36.9 C) (09/23 0630) Pulse Rate:  [63-98] 72 (09/23 0630) Resp:  [18-20] 20 (09/23 0630) BP: (126-144)/(45-71) 128/71 mmHg (09/23 0630) SpO2:  [96 %-100 %] 100 % (09/23 0630) Last BM Date: 09/01/15 General:   Alert and oriented, pleasant Head:  Normocephalic and atraumatic. Eyes:  No icterus, sclera clear. Conjuctiva pink.  Heart:  S1, S2 present, no murmurs noted.  Lungs: Clear to auscultation bilaterally, without wheezing, rales, or rhonchi.  Abdomen:  Bowel sounds present, soft, non-distended. Continued TTP LLQ, no other abdominal pain to palpation. No HSM or hernias noted. No rebound or guarding. No masses appreciated  Neurologic:  Alert and  oriented x4;  grossly normal neurologically. Skin:  Warm and dry, intact without significant lesions.  Psych:  Alert and cooperative. Normal mood and affect.  Intake/Output from previous day: 09/22 0701 - 09/23 0700 In: 4132.5 [P.O.:240; I.V.:2392.5; Blood:500; IV Piggyback:1000] Out: 800 [Urine:800] Intake/Output this shift:    Lab Results:  Recent Labs  09/01/15 0638 09/01/15 2006 09/02/15 0608  WBC 11.3* 9.7 8.2  HGB 7.7* 9.8* 8.7*  HCT 23.7* 30.6* 26.5*  PLT 234 256 217   BMET  Recent Labs  08/31/15 0620 09/01/15 0638 09/02/15 0608  NA 131* 132* 133*  K 3.4* 4.4 4.1  CL 106 111 110  CO2 20* 20* 20*  GLUCOSE 120* 82 82  BUN 25* 28* 17  CREATININE 0.84 0.73 0.66  CALCIUM 7.5* 7.8* 7.8*   LFT  Recent Labs  08/30/15 0926 08/31/15 0620  PROT 6.3* 5.7*  ALBUMIN 2.5* 2.2*  AST 20 22  ALT 12* 12*  ALKPHOS 65 65  BILITOT 0.9 0.7  BILIDIR 0.2  --   IBILI 0.7  --    PT/INR  Recent Labs  09/01/15 0638 09/02/15 0608  LABPROT 30.3* 36.0*  INR 2.96* 3.73*   Hepatitis Panel No results for input(s): HEPBSAG, HCVAB, HEPAIGM, HEPBIGM in the last 72 hours.   Studies/Results: Dg Chest 1 View  08/31/2015   CLINICAL DATA:  PT c/o fever and intermittent SOB today. Pt is currently admitted due to diverticulitis. Hx HTN, CAD, diabetes, A-fib, breast CA, former smoker  EXAM: CHEST  1 VIEW  COMPARISON:  Multiple prior chest x-ray exams including 08/14/2015 and 05/07/2002  FINDINGS: Heart is normal in size. There is shallow lung inflation. Mild prominence of interstitial markings is stable over time. There is minimal subsegmental atelectasis at the right lung base. There are no focal consolidations or pleural effusions.  There is right paratracheal density, increased over prior studies. Slight lobulation of the aortic knob. Findings raise the question of vascular abnormality or possible adenopathy. Visualized osseous structures have a normal appearance.  IMPRESSION: 1. Question mediastinal adenopathy or vascular abnormality. 2. Further evaluation CT of the chest with contrast is recommended. 3. Minimal subsegmental atelectasis at the right lung base. Shallow inflation.   Electronically Signed   By: Nolon Nations M.D.   On: 08/31/2015 16:22    Assessment: 69 year old female with recent hospitalization  for urosepsis, multiple chronic medical problems as outlined above, who presented with several day history of left lower quadrant abdominal pain. CT suggested of uncomplicated diverticulitis. She has had at least 3 CT documented episodes, patient has discussed possibility of partial colectomy in the past with her providers, at this time she does not want to pursue any surgery if possible. Husband recently had back  surgery and she does not feel like she can undergo surgery in the immediate future, although she is wanting surgery when he has recovered.  Notes marked improvement today, bowel movement last night and small one this morning. No hematochezia/melena. Microcytic anemia, hemoglobin 11.5 in January 2016. At time of discharge on 08/15/2015, hemoglobin 8.5. Hgb is currently stable 8.7 to 9.8 today and yesterday, improved from 7.7 yesterday morning. Iron and iron saturations low. Ferritin normal in the setting of acute diverticulitis. Cannot rule out trending IDA. Colonoscopy last year as outlined above. Hemoccults still pending. Leucocytosis resolved yesterday afternoon, was 8.2 this morning.   Plan: 1. Continue to monitor CBC 2. Advance diet to clear liquids today and assess for tolerance 3. Follow for pending hemoccults 4. Continued antibiotics 5. Will need outpatient follow-up after discharge to followon diverticulitis and anemia as well as plan for possible surgery when she is ready.    Walden Field, AGNP-C Adult & Gerontological Nurse Practitioner [facility]     LOS: 3 days    09/02/2015, 9:19 AM  Attending note:  Patient seen and examined. Abdominal pain much improved over that seen yesterday. Agree with advancing to a clear liquid diet for today.

## 2015-09-02 NOTE — Progress Notes (Signed)
[facility] PROGRESS NOTE  Melody Casey TKZ:[id number] DOB: [birth year] DOA: 08/30/2015 PCP: Kenn File, MD  Assessment/Plan: Diverticulitis: Per abdominal CT. History of same. Much improved this am. Less pain.  Denies nausea.several BM's since yesterday. Denies BRBPR. Ambulated in hall with minimal assist. Max temp 100.  Leukocytosis resolved.  Will continue Cipro and Flagyl day #4.  Evaluated by GI and recommendations followed and appreciated. Of note, she had a colonoscopy last year which yielded universal colonic diverticulosis otherwise normal.In addition, she verbalizes wish to postpone surgery until her husband has recovered from his back surgery.  Active Problems: Atrial fibrillation with RVR: resolved. developed after admission.remains rate controlled.Marland KitchenTSH within limits of normal. INR 3.7. Pharmacy to dose Coumadin  Acute renal insufficiency: Likely related to above. Resolved with IV fluids. Urine output good.     Hyponatremia: Mild and stable.  likely related to decreased oral intake. Continue IV fluids. Will monitor  Microcytic anemia: Hg 8.7 this am s/p 1 unit PRBC's. FOBT negative continues to drift down. May be somewhat dilutional. Chart review indicates Hg 11.1 8 months ago. Range 8.5-10 during recent hospitalization. anemia panel revealed B12 236, Iron 8 and ferritin 84. Await FOBT.Marland Kitchen Continue protonix. Will request GI consult.    Essential hypertension: remains soft. Has taken more pain medicine last 24 hours. Home medications include Norvasc lisinopril and I will continue to hold these.. Monitor closely   CAD (coronary artery disease): Status post stent and MI in 2012. No chest pain. Will hold her aspirin for now as well as her statin   AAA (abdominal aortic aneurysm) without rupture: Stable per CT.  Breast cancer. Remote. Status post lumpectomy. Continue home meds  Diabetes type 2. Hemoglobin A1c 7.5.  RA: stable at baseline.      Code  Status: full Family Communication: left daughter message on voicemail. Disposition Plan: home when ready   Consultants:  GI  Procedures:  none  Antibiotics:  cipro  flagyl  HPI/Subjective: Up in chair. Reports feeling better today  Objective: Filed Vitals:   09/02/15 0630  BP: 128/71  Pulse: 72  Temp: 98.4 F (36.9 C)  Resp: 20    Intake/Output Summary (Last 24 hours) at 09/02/15 1020 Last data filed at 09/02/15 0658  Gross per 24 hour  Intake 4132.5 ml  Output    800 ml  Net 3332.5 ml   Filed Weights   08/30/15 0910  Weight: 81.647 kg (180 lb)    Exam:   General:  Well nourished appears comfortable  Cardiovascular: RRR no MGR  Respiratory: normal effort BS clear bilaterally   Abdomen: less distended, soft sluggish BS less tender   Musculoskeletal: no clubbing or cyanosis  Data Reviewed: Basic Metabolic Panel:  Recent Labs Lab 08/30/15 0926 08/31/15 0620 09/01/15 0638 09/02/15 0608  NA 128* 131* 132* 133*  K 4.0 3.4* 4.4 4.1  CL 101 106 111 110  CO2 22 20* 20* 20*  GLUCOSE 170* 120* 82 82  BUN 31* 25* 28* 17  CREATININE 1.25* 0.84 0.73 0.66  CALCIUM 7.8* 7.5* 7.8* 7.8*   Liver Function Tests:  Recent Labs Lab 08/30/15 0926 08/31/15 0620  AST 20 22  ALT 12* 12*  ALKPHOS 65 65  BILITOT 0.9 0.7  PROT 6.3* 5.7*  ALBUMIN 2.5* 2.2*    Recent Labs Lab 08/30/15 0926  LIPASE 28   No results for input(s): AMMONIA in the last 168 hours. CBC:  Recent Labs Lab 08/30/15 5732 08/31/15 2025 09/01/15 4270 09/01/15 2006 09/02/15 6237  WBC 15.2* 14.6* 11.3* 9.7 8.2  NEUTROABS 12.2*  --   --  7.8*  --   HGB 8.6* 8.2* 7.7* 9.8* 8.7*  HCT 27.5* 25.4* 23.7* 30.6* 26.5*  MCV 73.1* 72.8* 73.4* 74.6* 74.4*  PLT 264 239 234 256 217   Cardiac Enzymes: No results for input(s): CKTOTAL, CKMB, CKMBINDEX, TROPONINI in the last 168 hours. BNP (last 3 results)  Recent Labs  08/13/15 1155  BNP 562.7*    ProBNP (last 3 results) No  results for input(s): PROBNP in the last 8760 hours.  CBG:  Recent Labs Lab 09/01/15 0751 09/01/15 1159 09/01/15 1642 09/01/15 2039 09/02/15 0735  GLUCAP 69 93 117* 87 77    Recent Results (from the past 240 hour(s))  Culture, blood (routine x 2)     Status: None (Preliminary result)   Collection Time: 08/31/15  3:00 PM  Result Value Ref Range Status   Specimen Description BLOOD LEFT ANTECUBITAL  Final   Special Requests BOTTLES DRAWN AEROBIC AND ANAEROBIC 8CC EACH  Final   Culture NO GROWTH 2 DAYS  Final   Report Status PENDING  Incomplete  Culture, blood (routine x 2)     Status: None (Preliminary result)   Collection Time: 08/31/15  3:04 PM  Result Value Ref Range Status   Specimen Description BLOOD RIGHT HAND  Final   Special Requests   Final    BOTTLES DRAWN AEROBIC AND ANAEROBIC AEB=12CC ANA=8CC   Culture NO GROWTH 2 DAYS  Final   Report Status PENDING  Incomplete     Studies: Dg Chest 1 View  08/31/2015   CLINICAL DATA:  PT c/o fever and intermittent SOB today. Pt is currently admitted due to diverticulitis. Hx HTN, CAD, diabetes, A-fib, breast CA, former smoker  EXAM: CHEST  1 VIEW  COMPARISON:  Multiple prior chest x-ray exams including 08/14/2015 and 05/07/2002  FINDINGS: Heart is normal in size. There is shallow lung inflation. Mild prominence of interstitial markings is stable over time. There is minimal subsegmental atelectasis at the right lung base. There are no focal consolidations or pleural effusions.  There is right paratracheal density, increased over prior studies. Slight lobulation of the aortic knob. Findings raise the question of vascular abnormality or possible adenopathy. Visualized osseous structures have a normal appearance.  IMPRESSION: 1. Question mediastinal adenopathy or vascular abnormality. 2. Further evaluation CT of the chest with contrast is recommended. 3. Minimal subsegmental atelectasis at the right lung base. Shallow inflation.    Electronically Signed   By: Nolon Nations M.D.   On: 08/31/2015 16:22    Scheduled Meds: . anastrozole  1 mg Oral Daily  . ciprofloxacin  400 mg Intravenous Q12H  . dofetilide  250 mcg Oral BID  . Glycerin (Adult)  1 suppository Rectal Once  . insulin aspart  0-15 Units Subcutaneous TID WC  . insulin aspart  0-5 Units Subcutaneous QHS  . metronidazole  500 mg Intravenous Q8H  . pantoprazole (PROTONIX) IV  40 mg Intravenous QHS  . senna-docusate  1 tablet Oral QHS  . sodium chloride  3 mL Intravenous Q12H  . Warfarin - Pharmacist Dosing Inpatient   Does not apply Q24H   Continuous Infusions: . 0.9 % NaCl with KCl 40 mEq / L 70 mL/hr (09/02/15 0228)    Principal Problem:   Acute diverticulitis Active Problems:   CAD (coronary artery disease)   Atrial fibrillation with RVR   AAA (abdominal aortic aneurysm) without rupture   Acute kidney injury  Hyponatremia   Microcytic anemia   Fever    Time spent: 30 minutes    [place] [facility] Pager [phone]. If 7PM-7AM, please contact night-coverage at [email or web address], password [facility] 09/02/2015, 10:20 AM  LOS: 3 days

## 2015-09-02 NOTE — Care Management Important Message (Signed)
Important Message  Patient Details  Name: Melody Casey MRN: [id number] Date of Birth: 07-Feb-1946   Medicare Important Message Given:  Yes-second notification given    Joylene Draft, RN 09/02/2015, 12:13 PM

## 2015-09-02 NOTE — Progress Notes (Signed)
[place] for Coumadin Indication: atrial fibrillation  Allergies  Allergen Reactions  . Miralax [Polyethylene Glycol] Swelling and Rash    Took [facility] brand developed rash. Patient states she tolerated name brand MiraLax in the past.  09/01/15. Patient states she had diffuse swelling including swelling of her lips.   . Vancomycin Rash and Shortness Of Breath  . Sulfa Antibiotics Rash    All over rash  . Sulfacetamide Sodium Rash    All over rash  . Acetaminophen Hives  . Banana Other (See Comments)    Unknown  . Oxycodone-Acetaminophen Itching  . Penicillins Other (See Comments)    Pt had so much as a child she's immune to it  . Latex Rash  . Tape Rash   Patient Measurements: Height: '5\' 8"'$  (172.7 cm) Weight: 180 lb (81.647 kg) IBW/kg (Calculated) : 63.9  Vital Signs: Temp: 98.4 F (36.9 C) (09/23 0630) Temp Source: Oral (09/23 0630) BP: 128/71 mmHg (09/23 0630) Pulse Rate: 72 (09/23 0630)  Labs:  Recent Labs  08/31/15 0620 09/01/15 1779 09/01/15 2006 09/02/15 0608  HGB 8.2* 7.7* 9.8* 8.7*  HCT 25.4* 23.7* 30.6* 26.5*  PLT 239 234 256 217  LABPROT 26.4* 30.3*  --  36.0*  INR 2.47* 2.96*  --  3.73*  CREATININE 0.84 0.73  --  0.66   Estimated Creatinine Clearance: 72.3 mL/min (by C-G formula based on Cr of 0.66).  Medical History: Past Medical History  Diagnosis Date  . HLD (hyperlipidemia)   . HTN (hypertension)   . Arthritis   . Foot deformity     right  . CAD (coronary artery disease)     a. NSTEMI 02/2011: occ mid Cx, DES to OM2, residual nonobst LAD dz.  . Tobacco abuse     stopped smoking 2012  . Diabetes mellitus   . Atrial fibrillation     a. Dx 03/2011 - on tikosyn/coumadin.  . Anxiety   . Diverticulitis of colon      2008, 04/2011, 12/2014, 08/2015  . Ureteral obstruction     History of gross hematuria/right hydronephrosis 2/2 to uteropelvic junction obstruction, s/p cystoscopy in January 2007 with bilateral  retrograde pyelography, right ureter arthroscopy, right ureteral stent placement, bladder biopsies, stent removal since then.  . Heart attack   . Wears dentures     top  . Dysrhythmia     a-fib  . Atrial fibrillation   . Cancer     breast  . Diverticulitis   . Hemangioma     liver  . AAA (abdominal aortic aneurysm)    Medications:  Prescriptions prior to admission  Medication Sig Dispense Refill Last Dose  . ALPRAZolam (XANAX) 0.25 MG tablet Take 0.25 mg by mouth daily as needed for anxiety.    Past Week at Unknown time  . amLODipine (NORVASC) 2.5 MG tablet TAKE ONE TABLET BY MOUTH ONCE DAILY IN THE MORNING 30 tablet 6 08/29/2015 at Unknown time  . anastrozole (ARIMIDEX) 1 MG tablet Take 1 tablet (1 mg total) by mouth daily. 90 tablet 3 08/29/2015 at Unknown time  . aspirin EC 81 MG tablet Take 81 mg by mouth daily.   08/29/2015 at Unknown time  . atorvastatin (LIPITOR) 40 MG tablet TAKE ONE AND ONE-HALF TABLETS BY MOUTH IN THE EVENING 135 tablet 1 08/29/2015 at Unknown time  . calcium-vitamin D (OSCAL WITH D) 500-200 MG-UNIT per tablet Take 1 tablet by mouth 2 (two) times daily.   08/29/2015 at Unknown time  .  ciprofloxacin (CIPRO) 500 MG tablet Take 1 tablet (500 mg total) by mouth 2 (two) times daily. 12 tablet 0 08/29/2015 at Unknown time  . dofetilide (TIKOSYN) 250 MCG capsule Take 1 capsule (250 mcg total) by mouth 2 (two) times daily. 60 capsule 11 08/29/2015 at Unknown time  . ibuprofen (ADVIL,MOTRIN) 200 MG tablet Take 200-400 mg by mouth daily as needed. For pain   08/29/2015 at Unknown time  . lisinopril (PRINIVIL,ZESTRIL) 40 MG tablet Take 40 mg by mouth every morning.    08/29/2015 at Unknown time  . magnesium oxide (MAG-OX) 400 MG tablet Take 400 mg twice a day with food 60 tablet 6 08/29/2015 at Unknown time  . metFORMIN (GLUMETZA) 500 MG (MOD) 24 hr tablet Take 500 mg by mouth at bedtime.    08/29/2015 at Unknown time  . niacin (NIASPAN) 500 MG CR tablet Take 1 tablet (500 mg total)  by mouth at bedtime. 60 tablet 3 08/29/2015 at Unknown time  . nitroGLYCERIN (NITROSTAT) 0.4 MG SL tablet Place 1 tablet (0.4 mg total) under the tongue every 5 (five) minutes as needed for chest pain. 25 tablet 5 Unknown  . pantoprazole (PROTONIX) 40 MG tablet Take 40 mg by mouth daily.   08/29/2015 at Unknown time  . potassium chloride SA (K-DUR,KLOR-CON) 20 MEQ tablet Take 20 mEq by mouth 2 (two) times daily.     08/29/2015 at Unknown time  . predniSONE (DELTASONE) 10 MG tablet    08/29/2015 at Unknown time  . warfarin (COUMADIN) 5 MG tablet Take 5 mg by mouth daily. Take 5 mg everyday.   Past Week at Unknown time   Assessment: Continuation of Coumadin PTA for AFIB Past history noted elevated INR 3.18 (08/14/15) at [facility] when on Coumadin 5 mg po daily. Patient discharge summary listed Coumadin 5 mg po daily. Patient on Cipro/Flagyl this admission which may increase INR in combination with Coumadin INR therapeutic on admission, INR trending up today to SUPRATHERAPEUTIC.  H/H trending down.   Goal of Therapy:  INR 2-3   Plan:  HOLD coumadin today INR/PT daily Monitor for signs and symptoms of bleeding.   Hart Robinsons A, RPH 09/02/2015,10:23 AM

## 2015-09-03 LAB — CBC
HCT: 27.4 % — ABNORMAL LOW (ref 36.0–46.0)
HEMOGLOBIN: 8.8 g/dL — AB (ref 12.0–15.0)
MCH: 23.7 pg — ABNORMAL LOW (ref 26.0–34.0)
MCHC: 32.1 g/dL (ref 30.0–36.0)
MCV: 73.7 fL — AB (ref 78.0–100.0)
PLATELETS: 238 10*3/uL (ref 150–400)
RBC: 3.72 MIL/uL — AB (ref 3.87–5.11)
RDW: 19.9 % — ABNORMAL HIGH (ref 11.5–15.5)
WBC: 6.1 10*3/uL (ref 4.0–10.5)

## 2015-09-03 LAB — GLUCOSE, CAPILLARY
GLUCOSE-CAPILLARY: 88 mg/dL (ref 65–99)
GLUCOSE-CAPILLARY: 108 mg/dL — AB (ref 65–99)

## 2015-09-03 LAB — PROTIME-INR
INR: 3.62 — ABNORMAL HIGH (ref 0.00–1.49)
Prothrombin Time: 35.3 seconds — ABNORMAL HIGH (ref 11.6–15.2)

## 2015-09-03 MED ORDER — OXYCODONE HCL 5 MG PO TABS
5.0000 mg | ORAL_TABLET | Freq: Four times a day (QID) | ORAL | Status: DC | PRN
  Administered 2015-09-03 – 2015-09-04 (×3): 5 mg via ORAL
  Filled 2015-09-03 (×3): qty 1

## 2015-09-03 MED ORDER — CYANOCOBALAMIN 1000 MCG/ML IJ SOLN
1000.0000 ug | Freq: Once | INTRAMUSCULAR | Status: AC
  Administered 2015-09-03: 1000 ug via INTRAMUSCULAR
  Filled 2015-09-03: qty 1

## 2015-09-03 NOTE — Progress Notes (Signed)
[place] for Coumadin Indication: atrial fibrillation  Allergies  Allergen Reactions  . Miralax [Polyethylene Glycol] Swelling and Rash    Took [facility] brand developed rash. Patient states she tolerated name brand MiraLax in the past.  09/01/15. Patient states she had diffuse swelling including swelling of her lips.   . Vancomycin Rash and Shortness Of Breath  . Sulfa Antibiotics Rash    All over rash  . Sulfacetamide Sodium Rash    All over rash  . Acetaminophen Hives  . Banana Other (See Comments)    Unknown  . Oxycodone-Acetaminophen Itching  . Penicillins Other (See Comments)    Pt had so much as a child she's immune to it  . Latex Rash  . Tape Rash   Patient Measurements: Height: '5\' 8"'$  (172.7 cm) Weight: 180 lb (81.647 kg) IBW/kg (Calculated) : 63.9  Vital Signs: Temp: 98 F (36.7 C) (09/24 0634) Temp Source: Oral (09/24 0634) BP: 144/71 mmHg (09/24 0634) Pulse Rate: 66 (09/24 0634)  Labs:  Recent Labs  09/01/15 6834 09/01/15 2006 09/02/15 0608 09/03/15 0547  HGB 7.7* 9.8* 8.7* 8.8*  HCT 23.7* 30.6* 26.5* 27.4*  PLT 234 256 217 238  LABPROT 30.3*  --  36.0* 35.3*  INR 2.96*  --  3.73* 3.62*  CREATININE 0.73  --  0.66  --    Estimated Creatinine Clearance: 72.3 mL/min (by C-G formula based on Cr of 0.66).  Medical History: Past Medical History  Diagnosis Date  . HLD (hyperlipidemia)   . HTN (hypertension)   . Arthritis   . Foot deformity     right  . CAD (coronary artery disease)     a. NSTEMI 02/2011: occ mid Cx, DES to OM2, residual nonobst LAD dz.  . Tobacco abuse     stopped smoking 2012  . Diabetes mellitus   . Atrial fibrillation     a. Dx 03/2011 - on tikosyn/coumadin.  . Anxiety   . Diverticulitis of colon      2008, 04/2011, 12/2014, 08/2015  . Ureteral obstruction     History of gross hematuria/right hydronephrosis 2/2 to uteropelvic junction obstruction, s/p cystoscopy in January 2007 with bilateral  retrograde pyelography, right ureter arthroscopy, right ureteral stent placement, bladder biopsies, stent removal since then.  . Heart attack   . Wears dentures     top  . Dysrhythmia     a-fib  . Atrial fibrillation   . Cancer     breast  . Diverticulitis   . Hemangioma     liver  . AAA (abdominal aortic aneurysm)    Medications:  Prescriptions prior to admission  Medication Sig Dispense Refill Last Dose  . ALPRAZolam (XANAX) 0.25 MG tablet Take 0.25 mg by mouth daily as needed for anxiety.    Past Week at Unknown time  . amLODipine (NORVASC) 2.5 MG tablet TAKE ONE TABLET BY MOUTH ONCE DAILY IN THE MORNING 30 tablet 6 08/29/2015 at Unknown time  . anastrozole (ARIMIDEX) 1 MG tablet Take 1 tablet (1 mg total) by mouth daily. 90 tablet 3 08/29/2015 at Unknown time  . aspirin EC 81 MG tablet Take 81 mg by mouth daily.   08/29/2015 at Unknown time  . atorvastatin (LIPITOR) 40 MG tablet TAKE ONE AND ONE-HALF TABLETS BY MOUTH IN THE EVENING 135 tablet 1 08/29/2015 at Unknown time  . calcium-vitamin D (OSCAL WITH D) 500-200 MG-UNIT per tablet Take 1 tablet by mouth 2 (two) times daily.   08/29/2015 at  Unknown time  . ciprofloxacin (CIPRO) 500 MG tablet Take 1 tablet (500 mg total) by mouth 2 (two) times daily. 12 tablet 0 08/29/2015 at Unknown time  . dofetilide (TIKOSYN) 250 MCG capsule Take 1 capsule (250 mcg total) by mouth 2 (two) times daily. 60 capsule 11 08/29/2015 at Unknown time  . ibuprofen (ADVIL,MOTRIN) 200 MG tablet Take 200-400 mg by mouth daily as needed. For pain   08/29/2015 at Unknown time  . lisinopril (PRINIVIL,ZESTRIL) 40 MG tablet Take 40 mg by mouth every morning.    08/29/2015 at Unknown time  . magnesium oxide (MAG-OX) 400 MG tablet Take 400 mg twice a day with food 60 tablet 6 08/29/2015 at Unknown time  . metFORMIN (GLUMETZA) 500 MG (MOD) 24 hr tablet Take 500 mg by mouth at bedtime.    08/29/2015 at Unknown time  . niacin (NIASPAN) 500 MG CR tablet Take 1 tablet (500 mg total)  by mouth at bedtime. 60 tablet 3 08/29/2015 at Unknown time  . nitroGLYCERIN (NITROSTAT) 0.4 MG SL tablet Place 1 tablet (0.4 mg total) under the tongue every 5 (five) minutes as needed for chest pain. 25 tablet 5 Unknown  . pantoprazole (PROTONIX) 40 MG tablet Take 40 mg by mouth daily.   08/29/2015 at Unknown time  . potassium chloride SA (K-DUR,KLOR-CON) 20 MEQ tablet Take 20 mEq by mouth 2 (two) times daily.     08/29/2015 at Unknown time  . predniSONE (DELTASONE) 10 MG tablet    08/29/2015 at Unknown time  . warfarin (COUMADIN) 5 MG tablet Take 5 mg by mouth daily. Take 5 mg everyday.   Past Week at Unknown time   Assessment: Continuation of Coumadin PTA for AFIB Past history noted elevated INR 3.18 (08/14/15) at [facility] when on Coumadin 5 mg po daily. Patient discharge summary listed Coumadin 5 mg po daily. Patient on Cipro/Flagyl this admission which may increase INR in combination with Coumadin INR was therapeutic on admission, INR trending up today to SUPRATHERAPEUTIC. H/H low but stable now.    Goal of Therapy:  INR 2-3   Plan:  HOLD coumadin today INR/PT daily Monitor for signs and symptoms of bleeding.   Hart Robinsons A, RPH 09/03/2015,8:49 AM

## 2015-09-03 NOTE — Progress Notes (Signed)
[facility] PROGRESS NOTE  Melody Casey YCX:[id number] DOB: 1946-06-18 DOA: 08/30/2015 PCP: Kenn File, MD  Assessment/Plan: Acute, recurrent Diverticulitis -Improved, abdominal pain better. -Will advance diet to soft solids. -DC IV morphine and start oxy IR for pain relief. -Hope for DC home in am. -Will need follow up with surgery in the OP setting given recurrent nature of diverticulitis.  Atrial Fibrillation with RVR -Rate controlled. -Continue coumadin for anticoagulation.  ARF -Resolved with IVF.  Hyponatremia -Mild and stable at 133.  Microcytic Anemia/B12 Deficiency -S/p 1 unit PRBCs. -B12 only 236; start supplementation.  HTN -Fair control; monitor.  CAD  -Stable, no CP.  AAA -At baseline per CT scan.  DM II -Well controlled.  RA -AT baseline.  H/o breast cancer -Noted.  Code Status: Full Code Family Communication: Patient only  Disposition Plan: Home when ready, anticipate 24-48 hours   Consultants:  GI   Antibiotics:  Cipro  Flagyl   Subjective: Feels improved, still weak  Objective: Filed Vitals:   09/02/15 0630 09/02/15 1404 09/02/15 2258 09/03/15 0634  BP: 128/71 143/69 144/57 144/71  Pulse: 72 84 75 66  Temp: 98.4 F (36.9 C) 98.2 F (36.8 C) 98.1 F (36.7 C) 98 F (36.7 C)  TempSrc: Oral Oral Oral Oral  Resp: '20 20 20 20  '$ Height:      Weight:      SpO2: 100% 94% 95% 93%    Intake/Output Summary (Last 24 hours) at 09/03/15 1214 Last data filed at 09/03/15 1053  Gross per 24 hour  Intake      0 ml  Output    400 ml  Net   -400 ml   Filed Weights   08/30/15 0910  Weight: 81.647 kg (180 lb)    Exam:   General:  AA Ox3  Cardiovascular: RRR  Respiratory: CTA B  Abdomen: S/NT/ND/+BS  Extremities: no C/C/E   Neurologic:  Non-focal  Data Reviewed: Basic Metabolic Panel:  Recent Labs Lab 08/30/15 0926 08/31/15 0620 09/01/15 0638 09/02/15 0608  NA 128* 131* 132* 133*  K 4.0 3.4*  4.4 4.1  CL 101 106 111 110  CO2 22 20* 20* 20*  GLUCOSE 170* 120* 82 82  BUN 31* 25* 28* 17  CREATININE 1.25* 0.84 0.73 0.66  CALCIUM 7.8* 7.5* 7.8* 7.8*   Liver Function Tests:  Recent Labs Lab 08/30/15 0926 08/31/15 0620  AST 20 22  ALT 12* 12*  ALKPHOS 65 65  BILITOT 0.9 0.7  PROT 6.3* 5.7*  ALBUMIN 2.5* 2.2*    Recent Labs Lab 08/30/15 0926  LIPASE 28   No results for input(s): AMMONIA in the last 168 hours. CBC:  Recent Labs Lab 08/30/15 0926 08/31/15 0620 09/01/15 0638 09/01/15 2006 09/02/15 0608 09/03/15 0547  WBC 15.2* 14.6* 11.3* 9.7 8.2 6.1  NEUTROABS 12.2*  --   --  7.8*  --   --   HGB 8.6* 8.2* 7.7* 9.8* 8.7* 8.8*  HCT 27.5* 25.4* 23.7* 30.6* 26.5* 27.4*  MCV 73.1* 72.8* 73.4* 74.6* 74.4* 73.7*  PLT 264 239 234 256 217 238   Cardiac Enzymes: No results for input(s): CKTOTAL, CKMB, CKMBINDEX, TROPONINI in the last 168 hours. BNP (last 3 results)  Recent Labs  08/13/15 1155  BNP 562.7*    ProBNP (last 3 results) No results for input(s): PROBNP in the last 8760 hours.  CBG:  Recent Labs Lab 09/02/15 0735 09/02/15 1151 09/02/15 1642 09/02/15 2257 09/03/15 0727  GLUCAP 77 83 85 80  88    Recent Results (from the past 240 hour(s))  Culture, blood (routine x 2)     Status: None (Preliminary result)   Collection Time: 08/31/15  3:00 PM  Result Value Ref Range Status   Specimen Description BLOOD LEFT ANTECUBITAL  Final   Special Requests BOTTLES DRAWN AEROBIC AND ANAEROBIC [place]  Final   Culture NO GROWTH 3 DAYS  Final   Report Status PENDING  Incomplete  Culture, blood (routine x 2)     Status: None (Preliminary result)   Collection Time: 08/31/15  3:04 PM  Result Value Ref Range Status   Specimen Description BLOOD RIGHT HAND  Final   Special Requests   Final    BOTTLES DRAWN AEROBIC AND ANAEROBIC AEB=12CC ANA=8CC   Culture NO GROWTH 3 DAYS  Final   Report Status PENDING  Incomplete  Culture, Urine     Status: None    Collection Time: 08/31/15  9:15 PM  Result Value Ref Range Status   Specimen Description URINE, CLEAN CATCH  Final   Special Requests NONE  Final   Culture   Final    NO GROWTH 1 DAY Performed at [facility]    Report Status 09/02/2015 FINAL  Final     Studies: No results found.  Scheduled Meds: . anastrozole  1 mg Oral Daily  . ciprofloxacin  400 mg Intravenous Q12H  . dofetilide  250 mcg Oral BID  . Glycerin (Adult)  1 suppository Rectal Once  . insulin aspart  0-15 Units Subcutaneous TID WC  . insulin aspart  0-5 Units Subcutaneous QHS  . metronidazole  500 mg Intravenous Q8H  . pantoprazole (PROTONIX) IV  40 mg Intravenous QHS  . senna-docusate  1 tablet Oral QHS  . sodium chloride  3 mL Intravenous Q12H  . Warfarin - Pharmacist Dosing Inpatient   Does not apply Q24H   Continuous Infusions: . 0.9 % NaCl with KCl 40 mEq / L 70 mL/hr (09/02/15 0228)    Principal Problem:   Acute diverticulitis Active Problems:   CAD (coronary artery disease)   Atrial fibrillation with RVR   AAA (abdominal aortic aneurysm) without rupture   Acute kidney injury   Hyponatremia   Microcytic anemia   Fever    Time spent: 25 minutes. Greater than 50% of this time was spent in direct contact with the patient coordinating care.    Lelon Frohlich  [facility] Pager [phone]  If 7PM-7AM, please contact night-coverage at [email or web address], password [facility] 09/03/2015, 12:14 PM  LOS: 4 days

## 2015-09-03 NOTE — Progress Notes (Signed)
Patient's pain is subsiding. Diet has been advanced by Dr. Jerilee Hoh  According to nursing staff only took (2) 1 mg morphine injections so far today.    Vital signs in last 24 hours: Temp:  [98 F (36.7 C)-98.2 F (36.8 C)] 98 F (36.7 C) (09/24 0634) Pulse Rate:  [66-84] 66 (09/24 0634) Resp:  [20] 20 (09/24 0634) BP: (143-144)/(57-71) 144/71 mmHg (09/24 0634) SpO2:  [93 %-95 %] 93 % (09/24 0634) Last BM Date: 09/03/15 General:    pleasant and cooperative in NAD Abdomen:  Nondistended. Positive bowel sounds. Diminished left lower quadrant tender to palpation. Extremities:  Without clubbing or edema.    Intake/Output from previous day:   Intake/Output this shift: Total I/O In: -  Out: 750 [Urine:750]  Lab Results:  Recent Labs  09/01/15 2006 09/02/15 0608 09/03/15 0547  WBC 9.7 8.2 6.1  HGB 9.8* 8.7* 8.8*  HCT 30.6* 26.5* 27.4*  PLT 256 217 238   BMET  Recent Labs  09/01/15 0638 09/02/15 0608  NA 132* 133*  K 4.4 4.1  CL 111 110  CO2 20* 20*  GLUCOSE 82 82  BUN 28* 17  CREATININE 0.73 0.66  CALCIUM 7.8* 7.8*   LFT No results for input(s): PROT, ALBUMIN, AST, ALT, ALKPHOS, BILITOT, BILIDIR, IBILI in the last 72 hours. PT/INR  Recent Labs  09/02/15 0608 09/03/15 0547  LABPROT 36.0* 35.3*  INR 3.73* 3.62*   Hepatitis Panel No results for input(s): HEPBSAG, HCVAB, HEPAIGM, HEPBIGM in the last 72 hours. C-Diff No results for input(s): CDIFFTOX in the last 72 hours.  Studies/Results: No results found.  Assessment: Principal Problem:   Acute diverticulitis Active Problems:   CAD (coronary artery disease)   Atrial fibrillation with RVR   AAA (abdominal aortic aneurysm) without rupture   Acute kidney injury   Hyponatremia   Microcytic anemia   Fever  impression:  Diverticulitis improving. Agree with advancing diet.  Recommendations: Hopefully, can transition to oral anabolic within the next 85-88 hrs.               Elective surgical  consultation as an outpatient.

## 2015-09-04 LAB — BASIC METABOLIC PANEL
ANION GAP: 4 — AB (ref 5–15)
BUN: 7 mg/dL (ref 6–20)
CALCIUM: 7.9 mg/dL — AB (ref 8.9–10.3)
CHLORIDE: 107 mmol/L (ref 101–111)
CO2: 22 mmol/L (ref 22–32)
Creatinine, Ser: 0.59 mg/dL (ref 0.44–1.00)
GFR calc non Af Amer: >60 mL/min (ref >60)
GLUCOSE: 107 mg/dL — AB (ref 65–99)
POTASSIUM: 4 mmol/L (ref 3.5–5.1)
Sodium: 133 mmol/L — ABNORMAL LOW (ref 135–145)

## 2015-09-04 LAB — CBC
HEMATOCRIT: 26.1 % — AB (ref 36.0–46.0)
HEMOGLOBIN: 8.6 g/dL — AB (ref 12.0–15.0)
MCH: 24.2 pg — AB (ref 26.0–34.0)
MCHC: 33 g/dL (ref 30.0–36.0)
MCV: 73.5 fL — AB (ref 78.0–100.0)
Platelets: 258 10*3/uL (ref 150–400)
RBC: 3.55 MIL/uL — AB (ref 3.87–5.11)
RDW: 19.7 % — ABNORMAL HIGH (ref 11.5–15.5)
WBC: 6.3 10*3/uL (ref 4.0–10.5)

## 2015-09-04 LAB — PROTIME-INR
INR: 2.6 — ABNORMAL HIGH (ref 0.00–1.49)
PROTHROMBIN TIME: 27.5 s — AB (ref 11.6–15.2)

## 2015-09-04 LAB — GLUCOSE, CAPILLARY
GLUCOSE-CAPILLARY: 145 mg/dL — AB (ref 65–99)
Glucose-Capillary: 106 mg/dL — ABNORMAL HIGH (ref 65–99)

## 2015-09-04 MED ORDER — CIPROFLOXACIN HCL 500 MG PO TABS: 500.0000 mg | ORAL_TABLET | Freq: Two times a day (BID) | ORAL | Status: DC

## 2015-09-04 MED ORDER — ONDANSETRON HCL 4 MG PO TABS: 4.0000 mg | ORAL_TABLET | Freq: Four times a day (QID) | ORAL | Status: DC | PRN

## 2015-09-04 MED ORDER — METRONIDAZOLE 500 MG PO TABS: 500.0000 mg | ORAL_TABLET | Freq: Three times a day (TID) | ORAL | Status: DC

## 2015-09-04 MED ORDER — OXYCODONE HCL 5 MG PO TABS: 5.0000 mg | ORAL_TABLET | Freq: Four times a day (QID) | ORAL | Status: DC | PRN

## 2015-09-04 MED ORDER — WARFARIN SODIUM 5 MG PO TABS: 2.5000 mg | ORAL_TABLET | Freq: Once | ORAL | Status: DC

## 2015-09-04 NOTE — Progress Notes (Signed)
[place] for Coumadin Indication: atrial fibrillation  Allergies  Allergen Reactions  . Miralax [Polyethylene Glycol] Swelling and Rash    Took [facility] brand developed rash. Patient states she tolerated name brand MiraLax in the past.  09/01/15. Patient states she had diffuse swelling including swelling of her lips.   . Vancomycin Rash and Shortness Of Breath  . Sulfa Antibiotics Rash    All over rash  . Sulfacetamide Sodium Rash    All over rash  . Acetaminophen Hives  . Banana Other (See Comments)    Unknown  . Oxycodone-Acetaminophen Itching  . Penicillins Other (See Comments)    Pt had so much as a child she's immune to it  . Latex Rash  . Tape Rash   Patient Measurements: Height: '5\' 8"'$  (172.7 cm) Weight: 180 lb (81.647 kg) IBW/kg (Calculated) : 63.9  Vital Signs: Temp: 99.9 F (37.7 C) (09/25 0558) Temp Source: Oral (09/25 0558) BP: 142/79 mmHg (09/25 0558) Pulse Rate: 79 (09/25 0558)  Labs:  Recent Labs  09/02/15 0608 09/03/15 0547 09/04/15 0544  HGB 8.7* 8.8* 8.6*  HCT 26.5* 27.4* 26.1*  PLT 217 238 258  LABPROT 36.0* 35.3* 27.5*  INR 3.73* 3.62* 2.60*  CREATININE 0.66  --  0.59   Estimated Creatinine Clearance: 72.3 mL/min (by C-G formula based on Cr of 0.59).  Medical History: Past Medical History  Diagnosis Date  . HLD (hyperlipidemia)   . HTN (hypertension)   . Arthritis   . Foot deformity     right  . CAD (coronary artery disease)     a. NSTEMI 02/2011: occ mid Cx, DES to OM2, residual nonobst LAD dz.  . Tobacco abuse     stopped smoking 2012  . Diabetes mellitus   . Atrial fibrillation     a. Dx 03/2011 - on tikosyn/coumadin.  . Anxiety   . Diverticulitis of colon      2008, 04/2011, 12/2014, 08/2015  . Ureteral obstruction     History of gross hematuria/right hydronephrosis 2/2 to uteropelvic junction obstruction, s/p cystoscopy in January 2007 with bilateral retrograde pyelography, right ureter  arthroscopy, right ureteral stent placement, bladder biopsies, stent removal since then.  . Heart attack   . Wears dentures     top  . Dysrhythmia     a-fib  . Atrial fibrillation   . Cancer     breast  . Diverticulitis   . Hemangioma     liver  . AAA (abdominal aortic aneurysm)    Medications:  Prescriptions prior to admission  Medication Sig Dispense Refill Last Dose  . ALPRAZolam (XANAX) 0.25 MG tablet Take 0.25 mg by mouth daily as needed for anxiety.    Past Week at Unknown time  . amLODipine (NORVASC) 2.5 MG tablet TAKE ONE TABLET BY MOUTH ONCE DAILY IN THE MORNING 30 tablet 6 08/29/2015 at Unknown time  . anastrozole (ARIMIDEX) 1 MG tablet Take 1 tablet (1 mg total) by mouth daily. 90 tablet 3 08/29/2015 at Unknown time  . aspirin EC 81 MG tablet Take 81 mg by mouth daily.   08/29/2015 at Unknown time  . atorvastatin (LIPITOR) 40 MG tablet TAKE ONE AND ONE-HALF TABLETS BY MOUTH IN THE EVENING 135 tablet 1 08/29/2015 at Unknown time  . calcium-vitamin D (OSCAL WITH D) 500-200 MG-UNIT per tablet Take 1 tablet by mouth 2 (two) times daily.   08/29/2015 at Unknown time  . ciprofloxacin (CIPRO) 500 MG tablet Take 1 tablet (500 mg  total) by mouth 2 (two) times daily. 12 tablet 0 08/29/2015 at Unknown time  . dofetilide (TIKOSYN) 250 MCG capsule Take 1 capsule (250 mcg total) by mouth 2 (two) times daily. 60 capsule 11 08/29/2015 at Unknown time  . ibuprofen (ADVIL,MOTRIN) 200 MG tablet Take 200-400 mg by mouth daily as needed. For pain   08/29/2015 at Unknown time  . lisinopril (PRINIVIL,ZESTRIL) 40 MG tablet Take 40 mg by mouth every morning.    08/29/2015 at Unknown time  . magnesium oxide (MAG-OX) 400 MG tablet Take 400 mg twice a day with food 60 tablet 6 08/29/2015 at Unknown time  . metFORMIN (GLUMETZA) 500 MG (MOD) 24 hr tablet Take 500 mg by mouth at bedtime.    08/29/2015 at Unknown time  . niacin (NIASPAN) 500 MG CR tablet Take 1 tablet (500 mg total) by mouth at bedtime. 60 tablet 3  08/29/2015 at Unknown time  . nitroGLYCERIN (NITROSTAT) 0.4 MG SL tablet Place 1 tablet (0.4 mg total) under the tongue every 5 (five) minutes as needed for chest pain. 25 tablet 5 Unknown  . pantoprazole (PROTONIX) 40 MG tablet Take 40 mg by mouth daily.   08/29/2015 at Unknown time  . potassium chloride SA (K-DUR,KLOR-CON) 20 MEQ tablet Take 20 mEq by mouth 2 (two) times daily.     08/29/2015 at Unknown time  . predniSONE (DELTASONE) 10 MG tablet    08/29/2015 at Unknown time  . warfarin (COUMADIN) 5 MG tablet Take 5 mg by mouth daily. Take 5 mg everyday.   Past Week at Unknown time   Assessment: Continuation of Coumadin PTA for AFIB Past history noted elevated INR 3.18 (08/14/15) at [facility] when on Coumadin 5 mg po daily. Patient discharge summary listed Coumadin 5 mg po daily. Patient on Cipro/Flagyl this admission which may increase INR in combination with Coumadin INR was therapeutic on admission, INR trended up and Coumadin was held x 2 days, INR has now trended back down to therapeutic range.  H/H low but stable now, no bleeding reported.  Pt on clear liquid diet.  Goal of Therapy:  INR 2-3   Plan:  Coumadin 2.'5mg'$  today x 1 INR/PT daily Monitor for signs and symptoms of bleeding.   Hart Robinsons A, RPH 09/04/2015,9:01 AM

## 2015-09-04 NOTE — Discharge Summary (Signed)
Physician Discharge Summary  Melody Casey OYD:[id number] DOB: [birth year] DOA: 08/30/2015  PCP: Kenn File, MD  Admit date: 08/30/2015 Discharge date: 09/04/2015  Time spent: 45 minutes  Recommendations for Outpatient Follow-up:  -Will be discharged home today. -Will follow up with PCP in 1 week, for continued INR monitoring. -Will need elective OP surgical followup for consideration of elective colectomy for recurrent diverticulitis.   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 Caryn Section 9/20: Melody Casey is a 69 y.o. female with past medical history that includes diverticulosis, recent urosepsis, diabetes, A. fib, TIA, hypertension, hyperlipidemia, CAD status post stent placement 2012, MI in 2012, diabetes presents to the emergency department from home with the chief complaint of persistent abdominal pain. Initial evaluation in the emergency department reveals, diverticulitis and acute renal insufficiency.   Patient reports that today get ago she developed sudden onset diffuse abdominal pain but mostly in the lower quadrants. She rates the pain an 8 out of 10 and describes it as sharp and constant. Associated symptoms include nausea without vomiting. She denies diarrhea constipation abdominal bloating. She denies fever chills headache chest pain palpitations shortness of breath. She denies dysuria hematuria frequency or urgency. Workup in the emergency department includes CT abdomen pelvis suggestive of diverticulitis. Lab work significant for WBCs 15.2 hemoglobin 8.6 sodium 128 when necessary 31 creatinine 1.25  calcium 7.8 INR 2.45 leukosis 170. Lactic acid is 1.8. Hepatic function panel total protein 6.3 albumen 2.5 ALT 12 otherwise unremarkable. Urinalysis is pending.  In the emergency department she is afebrile her blood pressure is on the soft side she is not hypoxic. She is provided with home will saline 500 mils intravenously, morphine intravenously Flagyl and Cipro intravenously.  Hospital Course:   Acute, recurrent Diverticulitis -Improved. -Tolerating diet. -Will DC with zofran and oxy IR. -Will need follow up with surgery in the OP setting given recurrent nature of diverticulitis.  Atrial Fibrillation with RVR -Rate controlled. -Continue coumadin for anticoagulation. -Will need close follow up with PCP for INR monitoring, given INR is supratherapeutic at 3.18 on DC.  ARF -Resolved with IVF.  Hyponatremia -Mild and stable at 133.  Microcytic Anemia/B12 Deficiency -S/p 1 unit PRBCs. -B12 only 236; Given IM B12; should receive monthly supplementation.  HTN -Fair control; monitor.  CAD  -Stable, no CP.  AAA -At baseline per CT scan.  DM II -Well controlled.  RA -At baseline.  H/o breast cancer -Noted.   Procedures:  None   Consultations:  GI  Discharge Instructions  Discharge Instructions    Diet - low sodium heart healthy    Complete by:  As directed      Increase activity slowly    Complete by:  As directed             Medication List    STOP taking these medications        ibuprofen 200 MG tablet  Commonly known as:  ADVIL,MOTRIN     predniSONE 10 MG tablet  Commonly known as:  DELTASONE      TAKE these medications  ALPRAZolam 0.25 MG tablet  Commonly known as:  XANAX  Take 0.25 mg by mouth daily as needed for anxiety.     amLODipine 2.5 MG tablet  Commonly known as:  NORVASC  TAKE ONE TABLET BY MOUTH ONCE DAILY IN THE MORNING     anastrozole 1 MG tablet  Commonly known as:  ARIMIDEX  Take 1 tablet (1 mg total) by mouth  daily.     aspirin EC 81 MG tablet  Take 81 mg by mouth daily.     atorvastatin 40 MG tablet  Commonly known as:  LIPITOR  TAKE ONE AND ONE-HALF TABLETS BY MOUTH IN THE EVENING     calcium-vitamin D 500-200 MG-UNIT per tablet  Commonly known as:  OSCAL WITH D  Take 1 tablet by mouth 2 (two) times daily.     ciprofloxacin 500 MG tablet  Commonly known as:  CIPRO  Take 1 tablet (500 mg total) by mouth 2 (two) times daily.     dofetilide 250 MCG capsule  Commonly known as:  TIKOSYN  Take 1 capsule (250 mcg total) by mouth 2 (two) times daily.     lisinopril 40 MG tablet  Commonly known as:  PRINIVIL,ZESTRIL  Take 40 mg by mouth every morning.     magnesium oxide 400 MG tablet  Commonly known as:  MAG-OX  Take 400 mg twice a day with food     metFORMIN 500 MG (MOD) 24 hr tablet  Commonly known as:  GLUMETZA  Take 500 mg by mouth at bedtime.     metroNIDAZOLE 500 MG tablet  Commonly known as:  FLAGYL  Take 1 tablet (500 mg total) by mouth 3 (three) times daily.     niacin 500 MG CR tablet  Commonly known as:  NIASPAN  Take 1 tablet (500 mg total) by mouth at bedtime.     nitroGLYCERIN 0.4 MG SL tablet  Commonly known as:  NITROSTAT  Place 1 tablet (0.4 mg total) under the tongue every 5 (five) minutes as needed for chest pain.     ondansetron 4 MG tablet  Commonly known as:  ZOFRAN  Take 1 tablet (4 mg total) by mouth every 6 (six) hours as needed for nausea.     oxyCODONE 5 MG immediate release tablet  Commonly known as:  Oxy IR/ROXICODONE  Take 1 tablet (5 mg total) by mouth every 6 (six) hours as needed for severe pain.     pantoprazole 40 MG tablet  Commonly known as:  PROTONIX  Take 40 mg by mouth daily.     potassium chloride SA 20 MEQ tablet  Commonly known as:  K-DUR,KLOR-CON  Take 20 mEq by mouth 2 (two) times daily.     warfarin 5 MG tablet  Commonly known as:  COUMADIN  Take 5 mg by mouth daily. Take 5 mg everyday.       Allergies  Allergen  Reactions  . Miralax [Polyethylene Glycol] Swelling and Rash    Took [facility] brand developed rash. Patient states she tolerated name brand MiraLax in the past.  09/01/15. Patient states she had diffuse swelling including swelling of her lips.   . Vancomycin Rash and Shortness Of Breath  . Sulfa Antibiotics Rash    All over rash  . Sulfacetamide Sodium Rash    All over rash  . Acetaminophen Hives  . Banana Other (See Comments)    Unknown  . Oxycodone-Acetaminophen Itching  . Penicillins Other (See Comments)    Pt had  so much as a child she's immune to it  . Latex Rash  . Tape Rash       Follow-up Information    Follow up with Kenn File, MD. Schedule an appointment as soon as possible for a visit in 2 weeks.   Specialty:  Family Medicine   Contact information:   [place] [address] [phone]       Follow up with Jamesetta So, MD. Schedule an appointment as soon as possible for a visit in 3 weeks.   Specialty:  General Surgery   Contact information:   [address] [place] [address] [phone]        The results of significant diagnostics from this hospitalization (including imaging, microbiology, ancillary and laboratory) are listed below for reference.    Significant Diagnostic Studies: Dg Chest 1 View  08/31/2015   CLINICAL DATA:  PT c/o fever and intermittent SOB today. Pt is currently admitted due to diverticulitis. Hx HTN, CAD, diabetes, A-fib, breast CA, former smoker  EXAM: CHEST  1 VIEW  COMPARISON:  Multiple prior chest x-ray exams including 08/14/2015 and 05/07/2002  FINDINGS: Heart is normal in size. There is shallow lung inflation. Mild prominence of interstitial markings is stable over time. There is minimal subsegmental atelectasis at the right lung base. There are no focal consolidations or pleural effusions.  There is right paratracheal density, increased over prior studies. Slight lobulation of the aortic knob. Findings  raise the question of vascular abnormality or possible adenopathy. Visualized osseous structures have a normal appearance.  IMPRESSION: 1. Question mediastinal adenopathy or vascular abnormality. 2. Further evaluation CT of the chest with contrast is recommended. 3. Minimal subsegmental atelectasis at the right lung base. Shallow inflation.   Electronically Signed   By: Nolon Nations M.D.   On: 08/31/2015 16:22   Dg Chest 1 View  08/13/2015   CLINICAL DATA:  Short of breath, pneumonia.  Subsequent encounter.  EXAM: CHEST  1 VIEW  COMPARISON:  Radiograph 08/13/2015  FINDINGS: Stable enlarged cardiac silhouette. There is interval increase in fine linear interstitial pattern. No pneumothorax. No focal consolidation.  IMPRESSION: Increased interstitial edema pattern.   Electronically Signed   By: Suzy Bouchard M.D.   On: 08/13/2015 07:09   Ct Head Wo Contrast  08/13/2015   CLINICAL DATA:  Acute onset of generalized weakness. Initial encounter.  EXAM: CT HEAD WITHOUT CONTRAST  TECHNIQUE: Contiguous axial images were obtained from the base of the skull through the vertex without intravenous contrast.  COMPARISON:  None.  FINDINGS: There is no evidence of acute infarction, mass lesion, or intra- or extra-axial hemorrhage on CT.  Prominence of the ventricles and sulci reflects mild to moderate cortical volume loss. Cerebellar atrophy is noted. Scattered periventricular and subcortical white matter change likely reflects small vessel ischemic microangiopathy.  The brainstem and fourth ventricle are within normal limits. The basal ganglia are unremarkable in appearance. The cerebral hemispheres demonstrate grossly normal gray-white differentiation. No mass effect or midline shift is seen.  There is no evidence of fracture; visualized osseous structures are unremarkable in appearance. The orbits are within normal limits. The paranasal sinuses and mastoid air cells are well-aerated. No significant soft tissue  abnormalities are seen.  IMPRESSION: 1. No acute intracranial pathology seen on CT. 2. Mild to moderate cortical volume loss and scattered small vessel ischemic microangiopathy.   Electronically Signed   By: Garald Balding M.D.   On: 08/13/2015 02:35   Ct Abdomen Pelvis  W Contrast  08/30/2015   CLINICAL DATA:  Left lower quadrant pain for 3 days.  EXAM: CT ABDOMEN AND PELVIS WITH CONTRAST  TECHNIQUE: Multidetector CT imaging of the abdomen and pelvis was performed using the standard protocol following bolus administration of intravenous contrast.  CONTRAST:  166m OMNIPAQUE IOHEXOL 300 MG/ML SOLN, 266mOMNIPAQUE IOHEXOL 300 MG/ML SOLN  COMPARISON:  CT abdomen pelvis dated 12/20/2014.  FINDINGS: Lower chest: Mild hypoventilatory changes versus scarring in the lung bases. Heavy atherosclerotic disease of the coronary arteries. Calcifications of the aortic valve.  Peritoneum: No free air or free fluid.  Liver: There is a hypoechoic microlobulated mass within the inferior right lobe of the liver measuring 3.0 x 2.3 by 2.1 cm.No intrahepatic biliary ductal dilatation.  Gallbladder: Unremarkable. No calcified gallstone. No pericholecystic fluid.  Pancreas: Unremarkable.No ductal dilatation.  Spleen: Unremarkable.  Adrenals glands: Unremarkable.  Kidneys, ureters, urinary bladder: No hydronephrosis. There is a cortical scarring of the right lower renal pole. Multiloculated cystic structure is seen occupying the right superolateral renal cortex measuring 2.5 by 2.1 x 2.4 cm, stable. There is also a tiny left inferior pole renal cyst measuring 2.2 cm in greatest dimension. 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  Reproductive: Leiomyomatous uterus, with multiple partially calcified uterine fibroids.  Bowel and appendix: No bowel obstruction.There is a long segment of wall thickening of the  sigmoid colon, which is redundant and contains a moderate to large amount of formed stool. There is also a small amount of free fluid within the left pericolic gutter, tracking into the pelvis. There are multiple scattered diverticula affecting heavily the left colon.  Vascular/Lymphatic: Again seen is infrarenal abdominal aortic aneurysm, in the background of heavy atherosclerotic disease of the aorta. The aneurysmal sac measures 3.3 cm in maximum transverse dimension. Atherosclerotic disease of the main abdominal arterial branches is also noted.No lymphadenopathy.  Abdominal wall/Musculoskeletal: Unremarkable.  Osseous structures: No worrisome osseous lesions identified. 3 mm calcific focus within the right ilium noted. There is a dextroconvex lumbosacral spine scoliosis with associated osteoarthritic changes. Moderate to severe posterior facet arthropathy is also seen in the lower lumbosacral spine.  IMPRESSION: Long segment of wall thickening of the sigmoid colon, in the background of multiple diverticula, with associated small amount of free fluid in the left pericolic gutter. Findings suggestive of diverticulitis, or other form of colitis involving the sigmoid colon. Correlation with colonoscopy results, after resolution of the acute symptoms may be considered to exclude underlying malignancy, although one is not seen on this exam, without bowel preparation.  Bilateral stable in appearance renal cysts, and right renal cortical scarring.  Stable in appearance infrarenal abdominal aortic aneurysm, in the background of heavy atherosclerotic disease of the aorta. Recommend followup by ultrasound in 3 years. This recommendation follows ACR consensus guidelines: White Paper of the ACR Incidental Findings Committee II on Vascular Findings. J Am Coll Radiol 2013; 1016:109-604Stable in appearance right hepatic hemangioma.  Leiomyomatous uterus.  3 mm stable sclerotic focus within the right ilium. In the absence of  history of malignancy this likely represents a bone island.  Atherosclerotic disease of the coronary arteries.   Electronically Signed   By: DoFidela Salisbury.D.   On: 08/30/2015 12:12   Dg Chest Port 1 View  08/14/2015   CLINICAL DATA:  Chest pressure and shortness of breath this morning. Intermittent cough.  EXAM: PORTABLE  CHEST - 1 VIEW  COMPARISON:  08/13/2015.  FINDINGS: Stable borderline enlarged cardiac silhouette. Decreased prominence of the pulmonary vasculature and interstitial markings. Diffuse osteopenia.  IMPRESSION: Improving changes of congestive heart failure.   Electronically Signed   By: Claudie Revering M.D.   On: 08/14/2015 10:21   Dg Chest Portable 1 View  08/13/2015   CLINICAL DATA:  Acute onset of generalized weakness and shortness of breath. Initial encounter.  EXAM: PORTABLE CHEST - 1 VIEW  COMPARISON:  Chest radiograph performed 10/24/2012  FINDINGS: The lungs are well-aerated. Mild vascular congestion is noted. Mildly increased interstitial markings on the left side could reflect mild asymmetric interstitial edema or possibly pneumonia. There is no evidence of pleural effusion or pneumothorax.  The cardiomediastinal silhouette is within normal limits. No acute osseous abnormalities are seen.  IMPRESSION: Mild vascular congestion noted. Mildly increased interstitial markings on the left side could reflect mild asymmetric interstitial edema or possibly pneumonia.   Electronically Signed   By: Garald Balding M.D.   On: 08/13/2015 01:28    Microbiology: Recent Results (from the past 240 hour(s))  Culture, blood (routine x 2)     Status: None (Preliminary result)   Collection Time: 08/31/15  3:00 PM  Result Value Ref Range Status   Specimen Description BLOOD LEFT ANTECUBITAL  Final   Special Requests BOTTLES DRAWN AEROBIC AND ANAEROBIC 8CC EACH  Final   Culture NO GROWTH 4 DAYS  Final   Report Status PENDING  Incomplete  Culture, blood (routine x 2)     Status: None (Preliminary  result)   Collection Time: 08/31/15  3:04 PM  Result Value Ref Range Status   Specimen Description BLOOD RIGHT HAND  Final   Special Requests   Final    BOTTLES DRAWN AEROBIC AND ANAEROBIC AEB=12CC ANA=8CC   Culture NO GROWTH 4 DAYS  Final   Report Status PENDING  Incomplete  Culture, Urine     Status: None   Collection Time: 08/31/15  9:15 PM  Result Value Ref Range Status   Specimen Description URINE, CLEAN CATCH  Final   Special Requests NONE  Final   Culture   Final    NO GROWTH 1 DAY Performed at [facility]    Report Status 09/02/2015 FINAL  Final     Labs: Basic Metabolic Panel:  Recent Labs Lab 08/30/15 0926 08/31/15 0620 09/01/15 0638 09/02/15 0608 09/04/15 0544  NA 128* 131* 132* 133* 133*  K 4.0 3.4* 4.4 4.1 4.0  CL 101 106 111 110 107  CO2 22 20* 20* 20* 22  GLUCOSE 170* 120* 82 82 107*  BUN 31* 25* 28* 17 7  CREATININE 1.25* 0.84 0.73 0.66 0.59  CALCIUM 7.8* 7.5* 7.8* 7.8* 7.9*   Liver Function Tests:  Recent Labs Lab 08/30/15 0926 08/31/15 0620  AST 20 22  ALT 12* 12*  ALKPHOS 65 65  BILITOT 0.9 0.7  PROT 6.3* 5.7*  ALBUMIN 2.5* 2.2*    Recent Labs Lab 08/30/15 0926  LIPASE 28   No results for input(s): AMMONIA in the last 168 hours. CBC:  Recent Labs Lab 08/30/15 0926  09/01/15 9767 09/01/15 2006 09/02/15 0608 09/03/15 0547 09/04/15 0544  WBC 15.2*  < > 11.3* 9.7 8.2 6.1 6.3  NEUTROABS 12.2*  --   --  7.8*  --   --   --   HGB 8.6*  < > 7.7* 9.8* 8.7* 8.8* 8.6*  HCT 27.5*  < > 23.7* 30.6* 26.5* 27.4*  26.1*  MCV 73.1*  < > 73.4* 74.6* 74.4* 73.7* 73.5*  PLT 264  < > 234 256 217 238 258  < > = values in this interval not displayed. Cardiac Enzymes: No results for input(s): CKTOTAL, CKMB, CKMBINDEX, TROPONINI in the last 168 hours. BNP: BNP (last 3 results)  Recent Labs  08/13/15 1155  BNP 562.7*    ProBNP (last 3 results) No results for input(s): PROBNP in the last 8760 hours.  CBG:  Recent Labs Lab  09/02/15 1642 09/02/15 2257 09/03/15 0727 09/03/15 1624 09/04/15 0754  GLUCAP 85 80 88 108* 106*       Signed:  HERNANDEZ ACOSTA,ESTELA  [facility] Pager: [phone] 09/04/2015, 10:15 AM

## 2015-09-04 NOTE — Progress Notes (Signed)
Patient discharged with instructions, prescription, and care notes.  Verbalized understanding via teach back.  IV was removed and the site was WNL. Patient voiced no further complaints or concerns at the time of discharge.  Appointments scheduled per instructions.  Patient left the floor via w/c with staff and family in stable condition. 

## 2015-09-05 LAB — CULTURE, BLOOD (ROUTINE X 2)
CULTURE: NO GROWTH
Culture: NO GROWTH

## 2015-09-06 ENCOUNTER — Encounter: Payer: Self-pay | Admitting: Gastroenterology

## 2015-09-06 ENCOUNTER — Telehealth: Payer: Self-pay | Admitting: Gastroenterology

## 2015-09-06 NOTE — Telephone Encounter (Signed)
Patient needs hospital follow up for anemia and diverticulitis in 4 weeks.

## 2015-09-06 NOTE — Telephone Encounter (Signed)
APPT MADE AND LETTER SENT  °

## 2015-09-15 ENCOUNTER — Ambulatory Visit (INDEPENDENT_AMBULATORY_CARE_PROVIDER_SITE_OTHER): Payer: Medicare Other | Admitting: Family Medicine

## 2015-09-15 ENCOUNTER — Encounter: Payer: Self-pay | Admitting: Family Medicine

## 2015-09-15 VITALS — BP 106/61 | HR 68 | Temp 96.9°F | Ht 68.0 in | Wt 170.4 lb

## 2015-09-15 DIAGNOSIS — R1013 Epigastric pain: Secondary | ICD-10-CM | POA: Diagnosis not present

## 2015-09-15 DIAGNOSIS — I4891 Unspecified atrial fibrillation: Secondary | ICD-10-CM

## 2015-09-15 DIAGNOSIS — I2510 Atherosclerotic heart disease of native coronary artery without angina pectoris: Secondary | ICD-10-CM

## 2015-09-15 LAB — POCT INR: INR: 3

## 2015-09-15 MED ORDER — ONDANSETRON HCL 4 MG PO TABS: 4.0000 mg | ORAL_TABLET | Freq: Four times a day (QID) | ORAL | Status: DC | PRN

## 2015-09-15 MED ORDER — OXYCODONE HCL 5 MG PO TABS: 5.0000 mg | ORAL_TABLET | Freq: Four times a day (QID) | ORAL | Status: DC | PRN

## 2015-09-15 NOTE — Progress Notes (Signed)
   HPI  Patient presents today for hospital follow-up  She is admitted to [facility] on September 20 with acute diverticulitis. She had left lower quadrant pain was treated with Cipro and Flagyl. She is still taking a small amount of oxycodone at home, 5 mg daily and she requested a refill that today.  She describes new onset crampy abdominal pain after eating which requires her to take the oxycodone. She denies fever, chills, sweats. She was having this previously but that has resolved. She states that she is trying to rest still gets plenty of fluids.  She denies dyspnea, cough, or chest pain.  A. fib She's compliant with Coumadin, she's been on the same dose for several years.  PMH: Smoking status noted ROS: Per HPI  Objective: BP 106/61 mmHg  Pulse 68  Temp(Src) 96.9 F (36.1 C) (Oral)  Ht $R'5\' 8"'VT$  (1.727 m)  Wt 170 lb 6.4 oz (77.293 kg)  BMI 25.92 kg/m2 Gen: NAD, alert, cooperative with exam HEENT: NCAT CV: RRR, good S1/S2, no murmur Resp: CTABL, no wheezes, non-labored Abd: Slight left lower quadrant tenderness to palpation, epigastric area with mild tenderness to palpation as well, no right upper quadrant pain, no guarding, positive bowel sounds Ext: No edema, warm Neuro: Alert and oriented, No gross deficits  Assessment and plan:  # Epigastric pain New onset since leaving the hospital, consider pancreatitis Checking labs Refill oxycodone 1 month, did discuss that this is a controlled substance and that is not a good long-term solution Has GI follow-up Continue PPI Avoid NSAIDs for now considering that this could be peptic ulcer   # A. fib Rate controlled Continue Coumadin at current dose, INR is 3.0  # Diverticulitis She has recurrent diverticulitis since considering a partial bowel resection Has GI follow-up Left lower quadrant pain is improved since hospitalization she states Denies constitutional signs such as fevers, diarrhea, or left lower  quadrant pain without palpation, no concern for acute diverticulitis at this time    Orders Placed This Encounter  Procedures  . Lipase  . CMP14+EGFR  . CBC with Differential  . POCT INR    Meds ordered this encounter  Medications  . oxyCODONE (OXY IR/ROXICODONE) 5 MG immediate release tablet    Sig: Take 1 tablet (5 mg total) by mouth every 6 (six) hours as needed for severe pain.    Dispense:  30 tablet    Refill:  0  . ondansetron (ZOFRAN) 4 MG tablet    Sig: Take 1 tablet (4 mg total) by mouth every 6 (six) hours as needed for nausea.    Dispense:  20 tablet    Refill:  0    Laroy Apple, MD [facility] 09/15/2015, 2:44 PM

## 2015-09-15 NOTE — Patient Instructions (Signed)
Great to see you!  We will follow up on your labs within a week  Get plenty of fluids and eat small frequent meals.    Abdominal Pain, Adult Many things can cause abdominal pain. Usually, abdominal pain is not caused by a disease and will improve without treatment. It can often be observed and treated at home. Your health care provider will do a physical exam and possibly order blood tests and X-rays to help determine the seriousness of your pain. However, in many cases, more time must pass before a clear cause of the pain can be found. Before that point, your health care provider may not know if you need more testing or further treatment. HOME CARE INSTRUCTIONS Monitor your abdominal pain for any changes. The following actions may help to alleviate any discomfort you are experiencing:  Only take over-the-counter or prescription medicines as directed by your health care provider.  Do not take laxatives unless directed to do so by your health care provider.  Try a clear liquid diet (broth, tea, or water) as directed by your health care provider. Slowly move to a bland diet as tolerated. SEEK MEDICAL CARE IF:  You have unexplained abdominal pain.  You have abdominal pain associated with nausea or diarrhea.  You have pain when you urinate or have a bowel movement.  You experience abdominal pain that wakes you in the night.  You have abdominal pain that is worsened or improved by eating food.  You have abdominal pain that is worsened with eating fatty foods.  You have a fever. SEEK IMMEDIATE MEDICAL CARE IF:  Your pain does not go away within 2 hours.  You keep throwing up (vomiting).  Your pain is felt only in portions of the abdomen, such as the right side or the left lower portion of the abdomen.  You pass bloody or black tarry stools. MAKE SURE YOU:  Understand these instructions.  Will watch your condition.  Will get help right away if you are not doing well or get  worse.   This information is not intended to replace advice given to you by your health care provider. Make sure you discuss any questions you have with your health care provider.   Document Released: 09/05/2005 Document Revised: 08/17/2015 Document Reviewed: 08/05/2013 Elsevier Interactive Patient Education [employer].

## 2015-09-16 LAB — CMP14+EGFR
ALT: 14 IU/L (ref 0–32)
AST: 23 IU/L (ref 0–40)
Albumin/Globulin Ratio: 0.9 — ABNORMAL LOW (ref 1.1–2.5)
Albumin: 3.6 g/dL (ref 3.5–4.8)
Alkaline Phosphatase: 81 IU/L (ref 39–117)
BILIRUBIN TOTAL: 0.4 mg/dL (ref 0.0–1.2)
BUN/Creatinine Ratio: 14 (ref 11–26)
BUN: 11 mg/dL (ref 8–27)
CALCIUM: 9.9 mg/dL (ref 8.7–10.3)
CHLORIDE: 98 mmol/L (ref 97–108)
CO2: 21 mmol/L (ref 18–29)
Creatinine, Ser: 0.78 mg/dL (ref 0.57–1.00)
GFR calc non Af Amer: 77 mL/min/{1.73_m2} (ref >59)
GFR, EST AFRICAN AMERICAN: 88 mL/min/{1.73_m2} (ref >59)
GLUCOSE: 95 mg/dL (ref 65–99)
Globulin, Total: 4.2 g/dL (ref 1.5–4.5)
POTASSIUM: 5.2 mmol/L (ref 3.5–5.2)
Sodium: 134 mmol/L (ref 134–144)
TOTAL PROTEIN: 7.8 g/dL (ref 6.0–8.5)

## 2015-09-16 LAB — CBC WITH DIFFERENTIAL/PLATELET
BASOS ABS: 0 10*3/uL (ref 0.0–0.2)
Basos: 0 %
EOS (ABSOLUTE): 0.1 10*3/uL (ref 0.0–0.4)
Eos: 1 %
HEMOGLOBIN: 10.7 g/dL — AB (ref 11.1–15.9)
Hematocrit: 35.3 % (ref 34.0–46.6)
IMMATURE GRANS (ABS): 0 10*3/uL (ref 0.0–0.1)
IMMATURE GRANULOCYTES: 0 %
LYMPHS: 35 %
Lymphocytes Absolute: 2.5 10*3/uL (ref 0.7–3.1)
MCH: 23.3 pg — AB (ref 26.6–33.0)
MCHC: 30.3 g/dL — ABNORMAL LOW (ref 31.5–35.7)
MCV: 77 fL — ABNORMAL LOW (ref 79–97)
MONOCYTES: 8 %
Monocytes Absolute: 0.6 10*3/uL (ref 0.1–0.9)
NEUTROS ABS: 4 10*3/uL (ref 1.4–7.0)
NEUTROS PCT: 56 %
PLATELETS: 401 10*3/uL — AB (ref 150–379)
RBC: 4.6 x10E6/uL (ref 3.77–5.28)
RDW: 20.6 % — ABNORMAL HIGH (ref 12.3–15.4)
WBC: 7.1 10*3/uL (ref 3.4–10.8)

## 2015-09-16 LAB — LIPASE: LIPASE: 31 U/L (ref 0–59)

## 2015-09-19 DIAGNOSIS — L84 Corns and callosities: Secondary | ICD-10-CM | POA: Diagnosis not present

## 2015-09-19 DIAGNOSIS — L603 Nail dystrophy: Secondary | ICD-10-CM | POA: Diagnosis not present

## 2015-09-19 DIAGNOSIS — E1151 Type 2 diabetes mellitus with diabetic peripheral angiopathy without gangrene: Secondary | ICD-10-CM | POA: Diagnosis not present

## 2015-09-19 DIAGNOSIS — I739 Peripheral vascular disease, unspecified: Secondary | ICD-10-CM | POA: Diagnosis not present

## 2015-09-27 ENCOUNTER — Telehealth: Payer: Self-pay | Admitting: Gastroenterology

## 2015-09-27 NOTE — Telephone Encounter (Signed)
I called Melody Casey and she had a BM this AM and had some bright red blood in the comode. She is not having any abdominal pain, no rectal pain and no other complaints at this time. She wanted appt earlier than 10/06/2015. So I have moved her up to 10/03/2015 at 2:30 Pm with Laban Emperor, NP. She will let us know if any more/worse problems.  Melody Casey originally scheduled with  Neil Crouch, PA on 10/06/2015 at 10:30 Am. Routing to her for FYI.

## 2015-09-27 NOTE — Telephone Encounter (Signed)
PATIENT CALLED AND WAS PUT ON A CANCEL LIST FOR HER APPOINTMENT.  SHE STATED THAT SHE IS HAVING SOME BLOOD WHEN SHE GOES TO THE BATHROOM.  IS THERE ANYTHING SHE NEEDS TO LOOK FOR?  PLEASE CALL

## 2015-09-27 NOTE — Telephone Encounter (Signed)
Agree with plan 

## 2015-10-03 ENCOUNTER — Ambulatory Visit (INDEPENDENT_AMBULATORY_CARE_PROVIDER_SITE_OTHER): Payer: Medicare Other | Admitting: Gastroenterology

## 2015-10-03 ENCOUNTER — Encounter: Payer: Self-pay | Admitting: Gastroenterology

## 2015-10-03 ENCOUNTER — Telehealth: Payer: Self-pay

## 2015-10-03 VITALS — BP 111/70 | HR 83 | Temp 98.6°F | Ht 67.0 in | Wt 168.2 lb

## 2015-10-03 DIAGNOSIS — R1013 Epigastric pain: Secondary | ICD-10-CM

## 2015-10-03 DIAGNOSIS — I2510 Atherosclerotic heart disease of native coronary artery without angina pectoris: Secondary | ICD-10-CM | POA: Diagnosis not present

## 2015-10-03 DIAGNOSIS — K5732 Diverticulitis of large intestine without perforation or abscess without bleeding: Secondary | ICD-10-CM | POA: Diagnosis not present

## 2015-10-03 DIAGNOSIS — D509 Iron deficiency anemia, unspecified: Secondary | ICD-10-CM

## 2015-10-03 MED ORDER — PANTOPRAZOLE SODIUM 40 MG PO TBEC: 40.0000 mg | DELAYED_RELEASE_TABLET | Freq: Two times a day (BID) | ORAL | Status: DC

## 2015-10-03 NOTE — Telephone Encounter (Signed)
Patient on long term anticoagulation of A fib, Ok to hold coumadin X 3 days for EGD.   Laroy Apple, MD [facility] 10/03/2015, 4:21 PM

## 2015-10-03 NOTE — Patient Instructions (Signed)
I have increased your Protonix to twice a day, 30 minutes before breakfast and dinner.   We have also scheduled you for an upper endoscopy with Dr. Oneida Alar in the near future.

## 2015-10-03 NOTE — Progress Notes (Signed)
Referring Provider: Timmothy Euler, MD Primary Care Physician:  Kenn File, MD Primary GI: Dr. Oneida Alar    Chief Complaint  Patient presents with  . Follow-up    diverticulitis  . Abdominal Pain    after eating, no appetite, saw brb in stool x1    HPI:   Melody Casey is a 69 y.o. female presenting today with a history of multiple episodes of diverticulitis, here for hospital follow-up after admission for uncomplicated diverticulitis in Sept 2016. Microcytic anemia noted with low iron and iron saturations but normal ferritin in setting of acute diverticulitis. Unable to rule out developing IDA. Last colonoscopy July 2015 by Dr. Wynetta Emery with universal diverticulosis.   Sees Dr. Arnoldo Morale tomorrow, 10/25, to discuss elective colectomy. Usually 15 minutes after eating cereal and coffee, gets sour stomach in epigastric area. Just feels like a "sour stomach". Loss of appetite. Protonix 40 mg daily. Was taking NSAIDs for knee pain historically. Oxycodone relieved pain within 30 minutes. Completed course of Cipro and Flagyl. No aspirin powders. No prior EGD. Gallbladder remains in situ. Associated nausea with pain. No dysphagia. Burping a lot. Sour stomach started after recent discharge. Saw one episode of bright red blood recently but isolated incidence. Saw clots in the toilet bowl. None further. Painless. No melena.   Past Medical History  Diagnosis Date  . HLD (hyperlipidemia)   . HTN (hypertension)   . Arthritis   . Foot deformity     right  . CAD (coronary artery disease)     a. NSTEMI 02/2011: occ mid Cx, DES to OM2, residual nonobst LAD dz.  . Tobacco abuse     stopped smoking 2012  . Diabetes mellitus   . Atrial fibrillation ([place])     a. Dx 03/2011 - on tikosyn/coumadin.  . Anxiety   . Diverticulitis of colon      2008, 04/2011, 12/2014, 08/2015  . Ureteral obstruction     History of gross hematuria/right hydronephrosis 2/2 to uteropelvic junction obstruction, s/p  cystoscopy in January 2007 with bilateral retrograde pyelography, right ureter arthroscopy, right ureteral stent placement, bladder biopsies, stent removal since then.  . Heart attack ([place])   . Wears dentures     top  . Dysrhythmia     a-fib  . Atrial fibrillation ([place])   . Cancer (HCC)     breast  . Diverticulitis   . Hemangioma     liver  . AAA (abdominal aortic aneurysm) [facility])     Past Surgical History  Procedure Laterality Date  . Tonsillectomy and adenoidectomy    . Knee arthroscopy      left  . Tubal ligation    . Right leg surgery  age 24    As a child for  ?scleroderma per patient  . Ureteral surgery  2011    rt ureterostomy-stent  . Coronary stent placement  2012  . Cardiac catheterization  2012    stent  . Breast lumpectomy with needle localization and axillary sentinel lymph node bx Right 03/01/2014    Procedure: BREAST LUMPECTOMY WITH NEEDLE LOCALIZATION AND AXILLARY SENTINEL LYMPH NODE BX;  Surgeon: Edward Jolly, MD;  Location: [place];  Service: General;  Laterality: Right;  . Colonoscopy with propofol N/A 06/29/2014    Dr. Wynetta Emery: universal diverticulosis    Current Outpatient Prescriptions  Medication Sig Dispense Refill  . ALPRAZolam (XANAX) 0.25 MG tablet Take 0.25 mg by mouth daily as needed for anxiety.     Marland Kitchen  amLODipine (NORVASC) 2.5 MG tablet TAKE ONE TABLET BY MOUTH ONCE DAILY IN THE MORNING 30 tablet 6  . anastrozole (ARIMIDEX) 1 MG tablet Take 1 tablet (1 mg total) by mouth daily. 90 tablet 3  . aspirin EC 81 MG tablet Take 81 mg by mouth daily.    Marland Kitchen atorvastatin (LIPITOR) 40 MG tablet TAKE ONE AND ONE-HALF TABLETS BY MOUTH IN THE EVENING 135 tablet 1  . calcium-vitamin D (OSCAL WITH D) 500-200 MG-UNIT per tablet Take 1 tablet by mouth 2 (two) times daily.    . ciprofloxacin (CIPRO) 500 MG tablet Take 1 tablet (500 mg total) by mouth 2 (two) times daily. 42 tablet 0  . dofetilide (TIKOSYN) 250 MCG capsule Take 1 capsule (250  mcg total) by mouth 2 (two) times daily. 60 capsule 11  . lisinopril (PRINIVIL,ZESTRIL) 40 MG tablet Take 40 mg by mouth every morning.     . magnesium oxide (MAG-OX) 400 MG tablet Take 400 mg twice a day with food 60 tablet 6  . metFORMIN (GLUMETZA) 500 MG (MOD) 24 hr tablet Take 500 mg by mouth at bedtime.     . metroNIDAZOLE (FLAGYL) 500 MG tablet Take 1 tablet (500 mg total) by mouth 3 (three) times daily. 63 tablet 0  . niacin (NIASPAN) 500 MG CR tablet Take 1 tablet (500 mg total) by mouth at bedtime. 60 tablet 3  . nitroGLYCERIN (NITROSTAT) 0.4 MG SL tablet Place 1 tablet (0.4 mg total) under the tongue every 5 (five) minutes as needed for chest pain. 25 tablet 5  . ondansetron (ZOFRAN) 4 MG tablet Take 1 tablet (4 mg total) by mouth every 6 (six) hours as needed for nausea. 20 tablet 0  . oxyCODONE (OXY IR/ROXICODONE) 5 MG immediate release tablet Take 1 tablet (5 mg total) by mouth every 6 (six) hours as needed for severe pain. 30 tablet 0  . pantoprazole (PROTONIX) 40 MG tablet Take 1 tablet (40 mg total) by mouth 2 (two) times daily before a meal. 60 tablet 3  . potassium chloride SA (K-DUR,KLOR-CON) 20 MEQ tablet Take 20 mEq by mouth 2 (two) times daily.      Marland Kitchen warfarin (COUMADIN) 5 MG tablet Take 5 mg by mouth daily. Take 5 mg everyday.     No current facility-administered medications for this visit.    Allergies as of 10/03/2015 - Review Complete 10/03/2015  Allergen Reaction Noted  . Miralax [polyethylene glycol] Swelling and Rash 06/14/2011  . Vancomycin Rash and Shortness Of Breath 08/18/2015  . Sulfa antibiotics Rash 10/24/2012  . Sulfacetamide sodium Rash 08/18/2015  . Acetaminophen Hives   . Banana Other (See Comments) 02/17/2014  . Oxycodone-acetaminophen Itching   . Penicillins Other (See Comments)   . Latex Rash 03/19/2014  . Tape Rash 03/12/2014    Family History  Problem Relation Age of Onset  . Colon cancer Neg Hx   . Liver disease Neg Hx   . Lung cancer  Father 22    deceased  . Cancer Father   . Heart failure Mother 47    deceased  . Heart disease Mother   . Diabetes Brother     Social History   Social History  . Marital Status: Married    Spouse Name: N/A  . Number of Children: 2  . 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 Smoking status: Former Smoker -- 2.00 packs/day for 50 years  Types: Cigarettes    Quit date: 02/08/2011  . Smokeless tobacco: Never Used  . Alcohol Use: No  . Drug Use: No  . Sexual Activity: Not Asked   Other Topics Concern  . None   Social History Narrative   Takes care 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     Review of Systems: Gen: feels tired after oxycodone, energy slowly coming back  CV: Denies chest pain, palpitations, syncope, peripheral edema, and claudication. Resp: Denies dyspnea at rest, cough, wheezing, coughing up blood, and pleurisy. GI: see HPI Derm: Denies rash, itching, dry skin Psych: +anxiety  Heme: Denies bruising, bleeding, and enlarged lymph nodes.  Physical Exam: BP 111/70 mmHg  Pulse 83  Temp(Src) 98.6 F (37 C) (Oral)  Ht '5\' 7"'$  (1.702 m)  Wt 168 lb 3.2 oz (76.295 kg)  BMI 26.34 kg/m2 General:   Alert and oriented. No distress noted. Pleasant and cooperative.  Head:  Normocephalic and atraumatic. Eyes:  Conjuctiva clear without scleral icterus. Mouth:  Oral mucosa pink and moist.  Heart:  S1, S2 present without murmurs, rubs, or gallops. Regular rate and rhythm. Abdomen:  +BS, soft, mild TTP epigastric region and non-distended. No rebound or guarding. No HSM or masses noted. Msk:  Symmetrical without gross deformities. Normal posture. Extremities:  Without edema. Neurologic:  Alert and  oriented x4;  grossly normal neurologically. Psych:  Alert and cooperative. Normal mood and affect.  Lab Results  Component Value Date   ALT 14 09/15/2015   AST 23 09/15/2015   ALKPHOS 81 09/15/2015    BILITOT 0.4 09/15/2015   Lab Results  Component Value Date   CREATININE 0.78 09/15/2015   BUN 11 09/15/2015   NA 134 09/15/2015   K 5.2 09/15/2015   CL 98 09/15/2015   CO2 21 09/15/2015    Hgb 10.7 early October   Lab Results  Component Value Date   IRON 8* 08/30/2015   TIBC 304 08/30/2015   FERRITIN 84 08/30/2015

## 2015-10-03 NOTE — Telephone Encounter (Signed)
Dr. Wendi Snipes,   This mutual patient was seen by Laban Emperor, NP in our office today for epigastric pain and anemia.  Vicente Males would like to know if we can have patient hold coumadin for 3 days prior to an EGD in the very near future.    Please advise!

## 2015-10-03 NOTE — Telephone Encounter (Signed)
Routing to Laban Emperor, NP for Melody Casey and to Candy [place] to schedule.

## 2015-10-04 ENCOUNTER — Other Ambulatory Visit: Payer: Self-pay

## 2015-10-04 DIAGNOSIS — K5732 Diverticulitis of large intestine without perforation or abscess without bleeding: Secondary | ICD-10-CM | POA: Diagnosis not present

## 2015-10-04 NOTE — Telephone Encounter (Signed)
Pt called back and is set for EGD on 11/01/2015 with [facility]. Mailed instructions

## 2015-10-04 NOTE — Telephone Encounter (Signed)
Called pt and she was at [facility]. States she will call me back to arrange

## 2015-10-05 NOTE — Assessment & Plan Note (Signed)
Unable to exclude evolving IDA. EGD as planned. Although she had moderate amount of hematochezia recently, likely benign. Colonoscopy up-to-date as of last year. No further episodes of rectal bleeding.

## 2015-10-05 NOTE — Telephone Encounter (Signed)
Noted and thanks.

## 2015-10-05 NOTE — Progress Notes (Signed)
cc'ed to pcp °

## 2015-10-05 NOTE — Assessment & Plan Note (Signed)
69 year old female with new onset dyspepsia in the setting of historic NSAIDs. Although gallbladder remains in situ, doubt biliary etiology. Microcytic anemia with low iron at 8 and ferritin low normal 84; unable to exclude evolving IDA. Colonoscopy up-to-date as of last year. Query gastritis, PUD, doubt occult malignancy. No prior EGD. On Coumadin for afib, which will be held X 3 days and approved by cardiology.   Proceed with upper endoscopy in the near future with Dr. Oneida Alar. The risks, benefits, and alternatives have been discussed in detail with patient. They have stated understanding and desire to proceed.  PROPOFOL due to polypharmacy HOLD COUMADIN X 3 DAYS: patient aware Increase Protonix to BID for now AVOID all NSAIDS

## 2015-10-05 NOTE — Assessment & Plan Note (Signed)
Multiple episodes in past. Will see Dr. Arnoldo Morale this week to discuss colectomy.

## 2015-10-06 ENCOUNTER — Ambulatory Visit: Payer: Medicare Other | Admitting: Gastroenterology

## 2015-10-15 ENCOUNTER — Ambulatory Visit (INDEPENDENT_AMBULATORY_CARE_PROVIDER_SITE_OTHER): Payer: Medicare Other | Admitting: Family Medicine

## 2015-10-15 VITALS — BP 125/69 | HR 74 | Temp 97.9°F | Ht 67.0 in | Wt 165.2 lb

## 2015-10-15 DIAGNOSIS — R350 Frequency of micturition: Secondary | ICD-10-CM | POA: Diagnosis not present

## 2015-10-15 DIAGNOSIS — R300 Dysuria: Secondary | ICD-10-CM

## 2015-10-15 DIAGNOSIS — I2510 Atherosclerotic heart disease of native coronary artery without angina pectoris: Secondary | ICD-10-CM

## 2015-10-15 DIAGNOSIS — R10819 Abdominal tenderness, unspecified site: Secondary | ICD-10-CM | POA: Diagnosis not present

## 2015-10-15 DIAGNOSIS — N3001 Acute cystitis with hematuria: Secondary | ICD-10-CM

## 2015-10-15 LAB — POCT URINALYSIS DIPSTICK
BILIRUBIN UA: NEGATIVE
Glucose, UA: NEGATIVE
KETONES UA: NEGATIVE
Nitrite, UA: POSITIVE
PH UA: 5
SPEC GRAV UA: 1.025
Urobilinogen, UA: NEGATIVE

## 2015-10-15 LAB — POCT UA - MICROSCOPIC ONLY
CRYSTALS, UR, HPF, POC: NEGATIVE
Casts, Ur, LPF, POC: NEGATIVE
YEAST UA: NEGATIVE

## 2015-10-15 NOTE — Patient Instructions (Signed)
The patient should take the Cipro that she has 500 mg twice daily for 7 days She should drink plenty of fluids and stay well hydrated She should return to this office in 7-10 days to have a repeat urinalysis checked We will call her with the results of the CBC and BMP once those results become available If she gets worse or develops a high fever she should go to the emergency room immediately or come back to this office

## 2015-10-15 NOTE — Progress Notes (Signed)
Subjective:    Patient ID: Melody Casey, female    DOB: September 17, 1946, 69 y.o.   MRN: [id number]  HPI Patient is here today for frequent urination, and dysuria. Her symptoms started approximately 3 days ago.  Patient also states that she has had blood in her stool and she is seeing GI and they are going to do a endoscopy. It is important to note that the patient was in the hospital in September for sepsis. At the time she did have a urinary tract infection. She also has a history of atrial fibrillation and takes Coumadin for this. She is allergic to penicillin and sulfa. The symptoms that she is having with her urinary tract now include pressure and frequency but no burning. She is has some bright red blood in the stool and she has an upcoming appointment for an endoscopy scheduled with [facility].   Review of Systems  Genitourinary: Positive for urgency and frequency.       Pressure in the suprapubic area       Patient Active Problem List   Diagnosis Date Noted  . Abdominal pain, epigastric 09/15/2015  . Fever   . Diverticulitis 08/30/2015  . Acute kidney injury ([place]) 08/30/2015  . Hyponatremia 08/30/2015  . Microcytic anemia 08/30/2015  . Diverticulosis 08/30/2015  . Hemangioma   . Acute diverticulitis   . Sepsis ([place]) 08/13/2015  . TIA (transient ischemic attack) 08/13/2015  . UTI (urinary tract infection) 08/13/2015  . Atrial fibrillation with rapid ventricular response ([place])   . Blood poisoning ([place])   . Rheumatoid arthritis ([place]) 06/20/2015  . AAA (abdominal aortic aneurysm) without rupture ([place]) 01/28/2015  . Persistent atrial fibrillation ([place]) 11/17/2014  . History of scleroderma 02/17/2014  . Breast cancer of upper-outer quadrant of right female breast ([place]) 02/01/2014  . Depression 12/07/2011  . Rectal bleed 06/14/2011  . High risk medication use 06/14/2011  . Atrial fibrillation with RVR ([place]) 04/04/2011  . CAD (coronary artery  disease)   . Tobacco abuse   . PRE-DIABETES 06/07/2008  . TOBACCO ABUSE 04/09/2008  . HYPERLIPIDEMIA 07/22/2007  . Essential hypertension 07/22/2007   Outpatient Encounter Prescriptions as of 10/15/2015  Medication Sig  . ALPRAZolam (XANAX) 0.25 MG tablet Take 0.25 mg by mouth daily as needed for anxiety.   Marland Kitchen amLODipine (NORVASC) 2.5 MG tablet TAKE ONE TABLET BY MOUTH ONCE DAILY IN THE MORNING  . anastrozole (ARIMIDEX) 1 MG tablet Take 1 tablet (1 mg total) by mouth daily.  Marland Kitchen aspirin EC 81 MG tablet Take 81 mg by mouth daily.  Marland Kitchen atorvastatin (LIPITOR) 40 MG tablet TAKE ONE AND ONE-HALF TABLETS BY MOUTH IN THE EVENING  . calcium-vitamin D (OSCAL WITH D) 500-200 MG-UNIT per tablet Take 1 tablet by mouth 2 (two) times daily.  Marland Kitchen dofetilide (TIKOSYN) 250 MCG capsule Take 1 capsule (250 mcg total) by mouth 2 (two) times daily.  Marland Kitchen lisinopril (PRINIVIL,ZESTRIL) 40 MG tablet Take 40 mg by mouth every morning.   . magnesium oxide (MAG-OX) 400 MG tablet Take 400 mg twice a day with food  . metFORMIN (GLUMETZA) 500 MG (MOD) 24 hr tablet Take 500 mg by mouth at bedtime.   . niacin (NIASPAN) 500 MG CR tablet Take 1 tablet (500 mg total) by mouth at bedtime.  . nitroGLYCERIN (NITROSTAT) 0.4 MG SL tablet Place 1 tablet (0.4 mg total) under the tongue every 5 (five) minutes as needed for chest pain.  Marland Kitchen ondansetron (ZOFRAN) 4 MG tablet Take 1  tablet (4 mg total) by mouth every 6 (six) hours as needed for nausea.  Marland Kitchen oxyCODONE (OXY IR/ROXICODONE) 5 MG immediate release tablet Take 1 tablet (5 mg total) by mouth every 6 (six) hours as needed for severe pain.  . pantoprazole (PROTONIX) 40 MG tablet Take 1 tablet (40 mg total) by mouth 2 (two) times daily before a meal.  . potassium chloride SA (K-DUR,KLOR-CON) 20 MEQ tablet Take 20 mEq by mouth 2 (two) times daily.    Marland Kitchen warfarin (COUMADIN) 5 MG tablet Take 5 mg by mouth daily. Take 5 mg everyday.  . metroNIDAZOLE (FLAGYL) 500 MG tablet Take 1 tablet (500 mg  total) by mouth 3 (three) times daily. (Patient not taking: Reported on 10/15/2015)  . [DISCONTINUED] ciprofloxacin (CIPRO) 500 MG tablet Take 1 tablet (500 mg total) by mouth 2 (two) times daily. (Patient not taking: Reported on 10/15/2015)   No facility-administered encounter medications on file as of 10/15/2015.       Objective:   Physical Exam  Constitutional: She is oriented to person, place, and time. She appears well-developed and well-nourished. No distress.  HENT:  Head: Normocephalic.  Eyes: Conjunctivae and EOM are normal. Pupils are equal, round, and reactive to light. Right eye exhibits no discharge. Left eye exhibits no discharge. No scleral icterus.  Abdominal: Soft. Bowel sounds are normal. She exhibits no distension and no mass. There is tenderness. There is no rebound and no guarding.  Musculoskeletal: Normal range of motion. She exhibits no edema.  Neurological: She is alert and oriented to person, place, and time.  Skin: Skin is dry. No rash noted.  Psychiatric: She has a normal mood and affect. Her behavior is normal. Judgment and thought content normal.  Nursing note and vitals reviewed.  BP 125/69 mmHg  Pulse 74  Temp(Src) 97.9 F (36.6 C) (Oral)  Ht 5' 7"  (1.702 m)  Wt 165 lb 3.2 oz (74.934 kg)  BMI 25.87 kg/m2  Results for orders placed or performed in visit on 10/15/15  POCT UA - Microscopic Only  Result Value Ref Range   WBC, Ur, HPF, POC tntc    RBC, urine, microscopic 10-12    Bacteria, U Microscopic many    Mucus, UA few    Epithelial cells, urine per micros few    Crystals, Ur, HPF, POC negative    Casts, Ur, LPF, POC negative    Yeast, UA negative   POCT urinalysis dipstick  Result Value Ref Range   Color, UA gold    Clarity, UA cloudy    Glucose, UA negative    Bilirubin, UA negative    Ketones, UA negative    Spec Grav, UA 1.025    Blood, UA large    pH, UA 5.0    Protein, UA 4+    Urobilinogen, UA negative    Nitrite, UA positive     Leukocytes, UA large (3+) (A) Negative         Assessment & Plan:  1. Dysuria -Drink plenty of fluids and take antibiotic as directed - POCT UA - Microscopic Only - POCT urinalysis dipstick - Urine culture - BMP8+EGFR - CBC with Differential/Platelet  2. Suprapubic tenderness -Drink plenty of fluids and take antibiotic as directed  3. Urinary frequency  4. Acute cystitis with hematuria -The patient has Cipro from a previous prescription and will take 500 mg twice daily for 7 days -She will continue to drink plenty of fluids -She understands that if the culture comes back requiring  a different antibiotic that we will call her and change the antibiotic to the appropriate antibiotic -She will return to the clinic in 10 days for recheck  Patient Instructions  The patient should take the Cipro that she has 500 mg twice daily for 7 days She should drink plenty of fluids and stay well hydrated She should return to this office in 7-10 days to have a repeat urinalysis checked We will call her with the results of the CBC and BMP once those results become available If she gets worse or develops a high fever she should go to the emergency room immediately or come back to this office   Arrie Senate MD

## 2015-10-16 LAB — CBC WITH DIFFERENTIAL/PLATELET
Basophils Absolute: 0 10*3/uL (ref 0.0–0.2)
Basos: 0 %
EOS (ABSOLUTE): 0.1 10*3/uL (ref 0.0–0.4)
Eos: 1 %
Hematocrit: 34.2 % (ref 34.0–46.6)
Hemoglobin: 10.7 g/dL — ABNORMAL LOW (ref 11.1–15.9)
IMMATURE GRANULOCYTES: 0 %
Immature Grans (Abs): 0 10*3/uL (ref 0.0–0.1)
LYMPHS ABS: 2 10*3/uL (ref 0.7–3.1)
Lymphs: 27 %
MCH: 24.5 pg — ABNORMAL LOW (ref 26.6–33.0)
MCHC: 31.3 g/dL — AB (ref 31.5–35.7)
MCV: 78 fL — AB (ref 79–97)
MONOS ABS: 0.8 10*3/uL (ref 0.1–0.9)
Monocytes: 11 %
NEUTROS PCT: 61 %
Neutrophils Absolute: 4.5 10*3/uL (ref 1.4–7.0)
PLATELETS: 291 10*3/uL (ref 150–379)
RBC: 4.37 x10E6/uL (ref 3.77–5.28)
RDW: 21.6 % — AB (ref 12.3–15.4)
WBC: 7.4 10*3/uL (ref 3.4–10.8)

## 2015-10-16 LAB — BMP8+EGFR
BUN / CREAT RATIO: 17 (ref 11–26)
BUN: 12 mg/dL (ref 8–27)
CO2: 22 mmol/L (ref 18–29)
Calcium: 10 mg/dL (ref 8.7–10.3)
Chloride: 101 mmol/L (ref 97–106)
Creatinine, Ser: 0.7 mg/dL (ref 0.57–1.00)
GFR calc Af Amer: 101 mL/min/{1.73_m2} (ref >59)
GFR calc non Af Amer: 87 mL/min/{1.73_m2} (ref >59)
GLUCOSE: 88 mg/dL (ref 65–99)
POTASSIUM: 4.9 mmol/L (ref 3.5–5.2)
SODIUM: 137 mmol/L (ref 136–144)

## 2015-10-17 ENCOUNTER — Ambulatory Visit (INDEPENDENT_AMBULATORY_CARE_PROVIDER_SITE_OTHER): Payer: Medicare Other | Admitting: Pharmacist

## 2015-10-17 DIAGNOSIS — Z79899 Other long term (current) drug therapy: Secondary | ICD-10-CM | POA: Diagnosis not present

## 2015-10-17 DIAGNOSIS — I4819 Other persistent atrial fibrillation: Secondary | ICD-10-CM

## 2015-10-17 DIAGNOSIS — I481 Persistent atrial fibrillation: Secondary | ICD-10-CM

## 2015-10-17 LAB — POCT INR: INR: 2.3

## 2015-10-17 NOTE — Patient Instructions (Signed)
Anticoagulation Dose Instructions as of 10/17/2015      Melody Casey Tue Wed Thu Fri Sat   New Dose 5 mg 5 mg 5 mg 5 mg 5 mg 5 mg 5 mg    Description        Continue to take '5mg'$  (1 tablet) of warfarin daily.

## 2015-10-18 ENCOUNTER — Encounter: Payer: Self-pay | Admitting: Cardiology

## 2015-10-18 ENCOUNTER — Ambulatory Visit (INDEPENDENT_AMBULATORY_CARE_PROVIDER_SITE_OTHER): Payer: Medicare Other | Admitting: Cardiology

## 2015-10-18 VITALS — BP 138/72 | HR 54 | Ht 67.0 in | Wt 165.5 lb

## 2015-10-18 DIAGNOSIS — I4891 Unspecified atrial fibrillation: Secondary | ICD-10-CM | POA: Diagnosis not present

## 2015-10-18 DIAGNOSIS — I2510 Atherosclerotic heart disease of native coronary artery without angina pectoris: Secondary | ICD-10-CM | POA: Diagnosis not present

## 2015-10-18 DIAGNOSIS — I481 Persistent atrial fibrillation: Secondary | ICD-10-CM

## 2015-10-18 DIAGNOSIS — R790 Abnormal level of blood mineral: Secondary | ICD-10-CM

## 2015-10-18 DIAGNOSIS — I4819 Other persistent atrial fibrillation: Secondary | ICD-10-CM

## 2015-10-18 LAB — MAGNESIUM: MAGNESIUM: 2 mg/dL (ref 1.5–2.5)

## 2015-10-18 LAB — URINE CULTURE

## 2015-10-18 NOTE — Progress Notes (Signed)
HPI The patient presents for followup of atrial fib.  Since I last saw her she was in the hospital for management of diverticulitis in late Sept and sepsis in early Sept. From a cardiac standpoint she's been doing well with Tikosyn. The patient denies any new symptoms such as chest discomfort, neck or arm discomfort. There has been no new shortness of breath, PND or orthopnea. There have been no reported palpitations, presyncope or syncope.  With her usual activity she denies any symptoms.    Of note I did review her hospital records. There was a questionable TIA. However, CT of her head was normal. Echocardiogram was unremarkable. No obvious deficit prolapse although she might have a little trouble with her handwriting. She does say that she might have to have surgery on her colon because of some bleeding. I reviewed labs and her hemoglobin is 10.4 which is up from her lowest and stable. It was checked a few days ago.  Allergies  Allergen Reactions  . Miralax [Polyethylene Glycol] Swelling and Rash    Took [facility] brand developed rash. Patient states she tolerated name brand MiraLax in the past.  09/01/15. Patient states she had diffuse swelling including swelling of her lips.   . Vancomycin Rash and Shortness Of Breath  . Sulfa Antibiotics Rash    All over rash  . Sulfacetamide Sodium Rash    All over rash  . Acetaminophen Hives  . Banana Other (See Comments)    Unknown  . Oxycodone-Acetaminophen Itching  . Penicillins Other (See Comments)    Pt had so much as a child she's immune to it  . Latex Rash  . Tape Rash    Current Outpatient Prescriptions  Medication Sig Dispense Refill  . ALPRAZolam (XANAX) 0.25 MG tablet Take 0.25 mg by mouth daily as needed for anxiety.     Marland Kitchen amLODipine (NORVASC) 2.5 MG tablet TAKE ONE TABLET BY MOUTH ONCE DAILY IN THE MORNING 30 tablet 6  . anastrozole (ARIMIDEX) 1 MG tablet Take 1 tablet (1 mg total) by mouth daily. 90 tablet 3  . aspirin EC 81 MG  tablet Take 81 mg by mouth daily.    Marland Kitchen atorvastatin (LIPITOR) 40 MG tablet TAKE ONE AND ONE-HALF TABLETS BY MOUTH IN THE EVENING 135 tablet 1  . calcium-vitamin D (OSCAL WITH D) 500-200 MG-UNIT per tablet Take 1 tablet by mouth 2 (two) times daily.    . ciprofloxacin (CIPRO) 500 MG tablet Take 500 mg by mouth 2 (two) times daily.    Marland Kitchen dofetilide (TIKOSYN) 250 MCG capsule Take 1 capsule (250 mcg total) by mouth 2 (two) times daily. 60 capsule 11  . lisinopril (PRINIVIL,ZESTRIL) 40 MG tablet Take 40 mg by mouth every morning.     . magnesium oxide (MAG-OX) 400 MG tablet Take 400 mg twice a day with food 60 tablet 6  . metFORMIN (GLUMETZA) 500 MG (MOD) 24 hr tablet Take 500 mg by mouth at bedtime.     . metroNIDAZOLE (FLAGYL) 500 MG tablet Take 1 tablet (500 mg total) by mouth 3 (three) times daily. 63 tablet 0  . niacin (NIASPAN) 500 MG CR tablet Take 1 tablet (500 mg total) by mouth at bedtime. 60 tablet 3  . nitroGLYCERIN (NITROSTAT) 0.4 MG SL tablet Place 1 tablet (0.4 mg total) under the tongue every 5 (five) minutes as needed for chest pain. 25 tablet 5  . ondansetron (ZOFRAN) 4 MG tablet Take 1 tablet (4 mg total) by mouth every 6 (  six) hours as needed for nausea. 20 tablet 0  . oxyCODONE (OXY IR/ROXICODONE) 5 MG immediate release tablet Take 1 tablet (5 mg total) by mouth every 6 (six) hours as needed for severe pain. 30 tablet 0  . pantoprazole (PROTONIX) 40 MG tablet Take 1 tablet (40 mg total) by mouth 2 (two) times daily before a meal. 60 tablet 3  . potassium chloride SA (K-DUR,KLOR-CON) 20 MEQ tablet Take 20 mEq by mouth 2 (two) times daily.      Marland Kitchen warfarin (COUMADIN) 5 MG tablet Take 5 mg by mouth daily. Take 5 mg everyday.     No current facility-administered medications for this visit.    Past Medical History  Diagnosis Date  . HLD (hyperlipidemia)   . HTN (hypertension)   . Arthritis   . Foot deformity     right  . CAD (coronary artery disease)     a. NSTEMI 02/2011: occ mid  Cx, DES to OM2, residual nonobst LAD dz.  . Tobacco abuse     stopped smoking 2012  . Diabetes mellitus   . Atrial fibrillation ([place])     a. Dx 03/2011 - on tikosyn/coumadin.  . Anxiety   . Diverticulitis of colon      2008, 04/2011, 12/2014, 08/2015  . Ureteral obstruction     History of gross hematuria/right hydronephrosis 2/2 to uteropelvic junction obstruction, s/p cystoscopy in January 2007 with bilateral retrograde pyelography, right ureter arthroscopy, right ureteral stent placement, bladder biopsies, stent removal since then.  . Wears dentures     top  . Cancer (HCC)     breast  . Diverticulitis   . Hemangioma     liver  . AAA (abdominal aortic aneurysm) [facility])     Past Surgical History  Procedure Laterality Date  . Tonsillectomy and adenoidectomy    . Knee arthroscopy      left  . Tubal ligation    . Right leg surgery  age 4    As a child for  ?scleroderma per patient  . Ureteral surgery  2011    rt ureterostomy-stent  . Coronary stent placement  2012  . Cardiac catheterization  2012    stent  . Breast lumpectomy with needle localization and axillary sentinel lymph node bx Right 03/01/2014    Procedure: BREAST LUMPECTOMY WITH NEEDLE LOCALIZATION AND AXILLARY SENTINEL LYMPH NODE BX;  Surgeon: Edward Jolly, MD;  Location: [facility];  Service: General;  Laterality: Right;  . Colonoscopy with propofol N/A 06/29/2014    Dr. Wynetta Emery: universal diverticulosis    ROS:  As stated in the HPI and negative for all other systems.  PHYSICAL EXAM BP 138/72 mmHg  Pulse 54  Ht '5\' 7"'$  (1.702 m)  Wt 165 lb 8 oz (75.07 kg)  BMI 25.91 kg/m2 GENERAL: Well appearing  NECK: No jugular venous distention, waveform within normal limits, carotid upstroke brisk and symmetric, no bruits, no thyromegaly   LUNGS: Clear to auscultation bilaterally  CHEST: Unremarkable  HEART: PMI not displaced or sustained,S1 and S2 within normal limits, no S3, no S4, no clicks, no rubs, no  murmurs  ABD: Flat, positive bowel sounds normal in frequency in pitch, no bruits, no rebound, no guarding, no midline pulsatile mass, no hepatomegaly, no splenomegaly  EXT: 2 plus pulses throughout, no edema, no cyanosis no , 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 10/18/2015   ASSESSMENT AND PLAN  HYPERTENSION -  Her BP has been well controlled on Norvasc.   No change in therapy is indicated.   Atrial fibrillation -  She is maintaining sinus rhythm on Tikosyn.  Her QT is OK. I did reduce her dose to 250 mcg twice daily.  She has had a low magnesium level consistently check one again today.  CAD - The patient has no new sypmtoms.  No further cardiovascular testing is indicated.  She can stop ASA.   ANEMIA - This is stable. I would not see this or her questionable TIA is a contraindication to surgery on her bowels should she need it. She is going to confer with the surgeon here if she decides she wants to pursue possible surgery.

## 2015-10-18 NOTE — Patient Instructions (Signed)
Your physician recommends that you return for lab work in [place] >> lab is on 1st floor - first hallway on right off the elevator - down hallway then take left - [address] on left  Your physician wants you to follow-up in: 6 months with Dr. Percival Spanish.  You will receive a reminder letter in the mail two months in advance. If you don't receive a letter, please call our office to schedule the follow-up appointment.

## 2015-10-25 ENCOUNTER — Ambulatory Visit: Payer: Medicare Other | Admitting: Family Medicine

## 2015-10-25 ENCOUNTER — Encounter: Payer: Self-pay | Admitting: Family Medicine

## 2015-10-25 ENCOUNTER — Ambulatory Visit (INDEPENDENT_AMBULATORY_CARE_PROVIDER_SITE_OTHER): Payer: Medicare Other | Admitting: Family Medicine

## 2015-10-25 VITALS — BP 103/62 | HR 62 | Temp 97.2°F | Ht 67.0 in | Wt 166.2 lb

## 2015-10-25 DIAGNOSIS — I2510 Atherosclerotic heart disease of native coronary artery without angina pectoris: Secondary | ICD-10-CM

## 2015-10-25 DIAGNOSIS — N390 Urinary tract infection, site not specified: Secondary | ICD-10-CM

## 2015-10-25 DIAGNOSIS — R300 Dysuria: Secondary | ICD-10-CM

## 2015-10-25 LAB — POCT UA - MICROSCOPIC ONLY
BACTERIA, U MICROSCOPIC: NEGATIVE
CRYSTALS, UR, HPF, POC: NEGATIVE
Casts, Ur, LPF, POC: NEGATIVE
RBC, URINE, MICROSCOPIC: NEGATIVE
Yeast, UA: NEGATIVE

## 2015-10-25 LAB — POCT URINALYSIS DIPSTICK
BILIRUBIN UA: NEGATIVE
Blood, UA: NEGATIVE
GLUCOSE UA: NEGATIVE
Ketones, UA: NEGATIVE
LEUKOCYTES UA: NEGATIVE
NITRITE UA: NEGATIVE
Protein, UA: NEGATIVE
Spec Grav, UA: 1.015
Urobilinogen, UA: NEGATIVE
pH, UA: 6.5

## 2015-10-25 NOTE — Patient Instructions (Signed)
Great to see you!  We will arrange a urology appointment for you to see if anything can be done for how frequent these have been.

## 2015-10-25 NOTE — Progress Notes (Signed)
   HPI  Patient presents today here to recheck for UTI.  Patienthad a UTI withenerobacteri and Pseudomonas treated with cipro She feels better.  HSe had urinary freq and cloudiness which ahs resolved She denies fevers, chills, sweats, decreased oral tolerance She has had decreased taste and appetite which perissts but she feels better overall  PMH: Smoking status noted ROS: Per HPI  Objective: BP 103/62 mmHg  Pulse 62  Temp(Src) 97.2 F (36.2 C) (Oral)  Ht '5\' 7"'$  (1.702 m)  Wt 166 lb 3.2 oz (75.388 kg)  BMI 26.02 kg/m2 Gen: NAD, alert, cooperative with exam HEENT: NCAT CV: RRR, good S1/S2, no murmur Resp: CTABL, no wheezes, non-labored Abd:  no cva tenderness Ext: No edema, warm Neuro: Alert and oriented, No gross deficits  Assessment and plan:  # UTI, Frequent UTI Recent UTI with pseudomonas and Enterobacter, this is the second in 2 months, the first she was hospitalized and was septic Resolved with cipro Send to urology as I am concerned this is a developing pattern of freq uti, with pseudpomonas in the mix she could develop very resistant combinations   Orders Placed This Encounter  Procedures  . Ambulatory referral to Urology    Referral Priority:  Routine    Referral Type:  Consultation    Referral Reason:  Specialty Services Required    Requested Specialty:  Urology    Number of Visits Requested:  1  . POCT urinalysis dipstick  . POCT UA - Microscopic Only    Laroy Apple, MD [place] 10/25/2015, 9:50 AM

## 2015-10-27 ENCOUNTER — Encounter (HOSPITAL_COMMUNITY)
Admission: RE | Admit: 2015-10-27 | Discharge: 2015-10-27 | Disposition: A | Discharge status: Home / Self Care | Payer: Medicare Other | Source: Ambulatory Visit | Attending: Gastroenterology | Admitting: Gastroenterology

## 2015-10-27 ENCOUNTER — Encounter (HOSPITAL_COMMUNITY): Admission: RE | Admit: 2015-10-27 | Payer: Medicare Other | Source: Ambulatory Visit

## 2015-10-27 ENCOUNTER — Encounter (HOSPITAL_COMMUNITY): Payer: Self-pay

## 2015-10-31 ENCOUNTER — Ambulatory Visit (INDEPENDENT_AMBULATORY_CARE_PROVIDER_SITE_OTHER): Payer: Medicare Other | Admitting: Family Medicine

## 2015-10-31 ENCOUNTER — Encounter: Payer: Self-pay | Admitting: Family Medicine

## 2015-10-31 VITALS — BP 104/54 | HR 78 | Temp 98.2°F | Ht 67.0 in | Wt 168.4 lb

## 2015-10-31 DIAGNOSIS — Z79899 Other long term (current) drug therapy: Secondary | ICD-10-CM | POA: Diagnosis not present

## 2015-10-31 DIAGNOSIS — I2510 Atherosclerotic heart disease of native coronary artery without angina pectoris: Secondary | ICD-10-CM

## 2015-10-31 DIAGNOSIS — E119 Type 2 diabetes mellitus without complications: Secondary | ICD-10-CM

## 2015-10-31 DIAGNOSIS — M05741 Rheumatoid arthritis with rheumatoid factor of right hand without organ or systems involvement: Secondary | ICD-10-CM

## 2015-10-31 DIAGNOSIS — I481 Persistent atrial fibrillation: Secondary | ICD-10-CM

## 2015-10-31 DIAGNOSIS — M05742 Rheumatoid arthritis with rheumatoid factor of left hand without organ or systems involvement: Secondary | ICD-10-CM

## 2015-10-31 DIAGNOSIS — I4819 Other persistent atrial fibrillation: Secondary | ICD-10-CM

## 2015-10-31 MED ORDER — PREDNISONE 10 MG PO TABS: ORAL_TABLET | ORAL | Status: DC

## 2015-10-31 NOTE — Progress Notes (Signed)
Subjective:  Patient ID: [name], female    DOB: 1946/01/27  Age: 69 y.o. MRN: [id number]  CC: Rheumatoid Arthritis   HPI NAO LINZ presents for  Pain in hands, neck and knees. Onset 2 days ago. With ambulation the pain is 7/10 in the knees. The pain in the hands and neck is 5/10. It is very painful to turn her head for rotation. She cannot do fine motions with the fingers currently. Diagnosed by rheumatology 5 months ago. She took an extended taper of prednisone. Her pain was 75% better after the first day. She is to undergo endoscopy tomorrow. She would like to have some relief going into that if at all possible. She has at times had to take oxycodone for pain relief.   Patient in for follow-up of atrial fibrillation. Patient denies any recent bouts of chest pain or palpitations. Additionally, patient is taking anticoagulants. Patient denies any recent excessive bleeding episodes including epistaxis, bleeding from the gums, genitalia, rectal bleeding or hematuria. Additionally there has been no excessive bruising.  Patient was noted to be prediabetic recently was found to have an A1c of 7.4. She is currently taking metformin. She states that her blood sugar did not go up during the previous prednisone treatment.  History Graciella has a past medical history of HLD (hyperlipidemia); HTN (hypertension); Arthritis; Foot deformity; CAD (coronary artery disease); Tobacco abuse; Diabetes mellitus; Atrial fibrillation ([place]); Anxiety; Diverticulitis of colon ( ); Ureteral obstruction; Wears dentures; Cancer ([place]); Diverticulitis; Hemangioma; and AAA (abdominal aortic aneurysm) ([place]).   She has past surgical history that includes Tonsillectomy and adenoidectomy; Knee arthroscopy; Tubal ligation; Right leg surgery (age 35); Ureteral surgery (2011); Coronary stent placement (2012); Cardiac catheterization (2012); Breast lumpectomy with needle localization and axillary sentinel lymph node bx (Right,  03/01/2014); and Colonoscopy with propofol (N/A, 06/29/2014).   Her family history includes Cancer in her father; Diabetes in her brother; Heart disease in her mother; Heart failure (age of onset: 30) in her mother; Lung cancer (age of onset: 43) in her father. There is no history of Colon cancer or Liver disease.She reports that she quit smoking about 4 years ago. Her smoking use included Cigarettes. She has a 100 pack-year smoking history. She has never used smokeless tobacco. She reports that she does not drink alcohol or use illicit drugs.  Outpatient Prescriptions Prior to Visit  Medication Sig Dispense Refill  . ALPRAZolam (XANAX) 0.25 MG tablet Take 0.25 mg by mouth daily as needed for anxiety.     Marland Kitchen amLODipine (NORVASC) 2.5 MG tablet TAKE ONE TABLET BY MOUTH ONCE DAILY IN THE MORNING 30 tablet 6  . anastrozole (ARIMIDEX) 1 MG tablet Take 1 tablet (1 mg total) by mouth daily. 90 tablet 3  . atorvastatin (LIPITOR) 40 MG tablet TAKE ONE AND ONE-HALF TABLETS BY MOUTH IN THE EVENING 135 tablet 1  . calcium-vitamin D (OSCAL WITH D) 500-200 MG-UNIT per tablet Take 1 tablet by mouth 2 (two) times daily.    Marland Kitchen dofetilide (TIKOSYN) 250 MCG capsule Take 1 capsule (250 mcg total) by mouth 2 (two) times daily. 60 capsule 11  . lisinopril (PRINIVIL,ZESTRIL) 40 MG tablet Take 40 mg by mouth every morning.     . magnesium oxide (MAG-OX) 400 MG tablet Take 400 mg twice a day with food 60 tablet 6  . metFORMIN (GLUMETZA) 500 MG (MOD) 24 hr tablet Take 500 mg by mouth at bedtime.     . niacin (NIASPAN) 500 MG CR tablet  Take 1 tablet (500 mg total) by mouth at bedtime. 60 tablet 3  . nitroGLYCERIN (NITROSTAT) 0.4 MG SL tablet Place 1 tablet (0.4 mg total) under the tongue every 5 (five) minutes as needed for chest pain. 25 tablet 5  . pantoprazole (PROTONIX) 40 MG tablet Take 1 tablet (40 mg total) by mouth 2 (two) times daily before a meal. 60 tablet 3  . potassium chloride SA (K-DUR,KLOR-CON) 20 MEQ tablet Take  20 mEq by mouth 2 (two) times daily.      Marland Kitchen warfarin (COUMADIN) 5 MG tablet Take 5 mg by mouth daily. Take 5 mg everyday.    . ondansetron (ZOFRAN) 4 MG tablet Take 1 tablet (4 mg total) by mouth every 6 (six) hours as needed for nausea. (Patient not taking: Reported on 10/31/2015) 20 tablet 0   No facility-administered medications prior to visit.    ROS Review of Systems  Constitutional: Negative for fever, activity change and appetite change.  HENT: Negative for congestion, rhinorrhea and sore throat.   Eyes: Negative for visual disturbance.  Respiratory: Negative for cough and shortness of breath.   Cardiovascular: Negative for chest pain.  Gastrointestinal: Negative for nausea and abdominal pain.  Musculoskeletal: Positive for myalgias, joint swelling, arthralgias, neck pain and neck stiffness.    Objective:  BP 104/54 mmHg  Pulse 78  Temp(Src) 98.2 F (36.8 C) (Oral)  Ht '5\' 7"'$  (1.702 m)  Wt 168 lb 6.4 oz (76.386 kg)  BMI 26.37 kg/m2  SpO2 97%  BP Readings from Last 3 Encounters:  10/31/15 104/54  10/25/15 103/62  10/18/15 138/72    Wt Readings from Last 3 Encounters:  10/31/15 168 lb 6.4 oz (76.386 kg)  10/25/15 166 lb 3.2 oz (75.388 kg)  10/18/15 165 lb 8 oz (75.07 kg)     Physical Exam  Constitutional: She is oriented to person, place, and time. She appears well-developed and well-nourished. No distress.  HENT:  Head: Normocephalic and atraumatic.  Right Ear: External ear normal.  Left Ear: External ear normal.  Nose: Nose normal.  Mouth/Throat: Oropharynx is clear and moist.  Eyes: Conjunctivae and EOM are normal. Pupils are equal, round, and reactive to light.  Neck: Normal range of motion. Neck supple. No thyromegaly present.  Cardiovascular: Normal rate, regular rhythm and normal heart sounds.   No murmur heard. Pulmonary/Chest: Effort normal and breath sounds normal. No respiratory distress. She has no wheezes. She has no rales.  Abdominal: Soft.  Bowel sounds are normal. She exhibits no distension. There is no tenderness.  Lymphadenopathy:    She has no cervical adenopathy.  Neurological: She is alert and oriented to person, place, and time. She has normal reflexes.  Skin: Skin is warm and dry.  Psychiatric: She has a normal mood and affect. Her behavior is normal. Judgment and thought content normal.    Lab Results  Component Value Date   HGBA1C 7.5* 08/31/2015   HGBA1C 7.4 08/18/2015   HGBA1C 7.0* 05/18/2011    Lab Results  Component Value Date   WBC 7.4 10/15/2015   HGB 8.6* 09/04/2015   HCT 34.2 10/15/2015   PLT 258 09/04/2015   GLUCOSE 88 10/15/2015   CHOL  02/14/2011    181        ATP III CLASSIFICATION:  <200     mg/dL   Desirable  200-239  mg/dL   Borderline High  >=240    mg/dL   High          TRIG  130 02/14/2011   HDL 29* 02/14/2011   LDLDIRECT 158.6 04/05/2008   LDLCALC * 02/14/2011    126        Total Cholesterol/HDL:CHD Risk Coronary Heart Disease Risk Table                     Men   Women  1/2 Average Risk   3.4   3.3  Average Risk       5.0   4.4  2 X Average Risk   9.6   7.1  3 X Average Risk  23.4   11.0        Use the calculated Patient Ratio above and the CHD Risk Table to determine the patient's CHD Risk.        ATP III CLASSIFICATION (LDL):  <100     mg/dL   Optimal  100-129  mg/dL   Near or Above                    Optimal  130-159  mg/dL   Borderline  160-189  mg/dL   High  >190     mg/dL   Very High   ALT 14 09/15/2015   AST 23 09/15/2015   NA 137 10/15/2015   K 4.9 10/15/2015   CL 101 10/15/2015   CREATININE 0.70 10/15/2015   BUN 12 10/15/2015   CO2 22 10/15/2015   TSH 2.327 08/31/2015   INR 2.3 10/17/2015   HGBA1C 7.5* 08/31/2015    Dg Chest 1 View  08/31/2015  CLINICAL DATA:  PT c/o fever and intermittent SOB today. Pt is currently admitted due to diverticulitis. Hx HTN, CAD, diabetes, A-fib, breast CA, former smoker EXAM: CHEST  1 VIEW COMPARISON:  Multiple prior  chest x-ray exams including 08/14/2015 and 05/07/2002 FINDINGS: Heart is normal in size. There is shallow lung inflation. Mild prominence of interstitial markings is stable over time. There is minimal subsegmental atelectasis at the right lung base. There are no focal consolidations or pleural effusions. There is right paratracheal density, increased over prior studies. Slight lobulation of the aortic knob. Findings raise the question of vascular abnormality or possible adenopathy. Visualized osseous structures have a normal appearance. IMPRESSION: 1. Question mediastinal adenopathy or vascular abnormality. 2. Further evaluation CT of the chest with contrast is recommended. 3. Minimal subsegmental atelectasis at the right lung base. Shallow inflation. Electronically Signed   By: Nolon Nations M.D.   On: 08/31/2015 16:22   Ct Abdomen Pelvis W Contrast  08/30/2015  CLINICAL DATA:  Left lower quadrant pain for 3 days. EXAM: CT ABDOMEN AND PELVIS WITH CONTRAST TECHNIQUE: Multidetector CT imaging of the abdomen and pelvis was performed using the standard protocol following bolus administration of intravenous contrast. CONTRAST:  156m OMNIPAQUE IOHEXOL 300 MG/ML SOLN, 258mOMNIPAQUE IOHEXOL 300 MG/ML SOLN COMPARISON:  CT abdomen pelvis dated 12/20/2014. FINDINGS: Lower chest: Mild hypoventilatory changes versus scarring in the lung bases. Heavy atherosclerotic disease of the coronary arteries. Calcifications of the aortic valve. Peritoneum: No free air or free fluid. Liver: There is a hypoechoic microlobulated mass within the inferior right lobe of the liver measuring 3.0 x 2.3 by 2.1 cm.No intrahepatic biliary ductal dilatation. Gallbladder: Unremarkable. No calcified gallstone. No pericholecystic fluid. Pancreas: Unremarkable.No ductal dilatation. Spleen: Unremarkable. Adrenals glands: Unremarkable. Kidneys, ureters, urinary bladder: No hydronephrosis. There is a cortical scarring of the right lower renal pole.  Multiloculated cystic structure is seen occupying the right superolateral renal cortex measuring 2.5 by  2.1 x 2.4 cm, stable. There is also a tiny left inferior pole renal cyst measuring 2.2 cm in greatest dimension. 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 Reproductive: Leiomyomatous uterus, with multiple partially calcified uterine fibroids. Bowel and appendix: No bowel obstruction.There is a long segment of wall thickening of the sigmoid colon, which is redundant and contains a moderate to large amount of formed stool. There is also a small amount of free fluid within the left pericolic gutter, tracking into the pelvis. There are multiple scattered diverticula affecting heavily the left colon. Vascular/Lymphatic: Again seen is infrarenal abdominal aortic aneurysm, in the background of heavy atherosclerotic disease of the aorta. The aneurysmal sac measures 3.3 cm in maximum transverse dimension. Atherosclerotic disease of the main abdominal arterial branches is also noted.No lymphadenopathy. Abdominal wall/Musculoskeletal: Unremarkable. Osseous structures: No worrisome osseous lesions identified. 3 mm calcific focus within the right ilium noted. There is a dextroconvex lumbosacral spine scoliosis with associated osteoarthritic changes. Moderate to severe posterior facet arthropathy is also seen in the lower lumbosacral spine. IMPRESSION: Long segment of wall thickening of the sigmoid colon, in the background of multiple diverticula, with associated small amount of free fluid in the left pericolic gutter. Findings suggestive of diverticulitis, or other form of colitis involving the sigmoid colon. Correlation with colonoscopy results, after resolution of the acute symptoms may be considered to exclude underlying malignancy, although one is not seen on this exam, without bowel  preparation. Bilateral stable in appearance renal cysts, and right renal cortical scarring. Stable in appearance infrarenal abdominal aortic aneurysm, in the background of heavy atherosclerotic disease of the aorta. Recommend followup by ultrasound in 3 years. This recommendation follows ACR consensus guidelines: White Paper of the ACR Incidental Findings Committee II on Vascular Findings. J Am Coll Radiol 2013; 84:696-295 Stable in appearance right hepatic hemangioma. Leiomyomatous uterus. 3 mm stable sclerotic focus within the right ilium. In the absence of history of malignancy this likely represents a bone island. Atherosclerotic disease of the coronary arteries. Electronically Signed   By: Fidela Salisbury M.D.   On: 08/30/2015 12:12    Assessment & Plan:   Taler was seen today for rheumatoid arthritis.  Diagnoses and all orders for this visit:  Persistent atrial fibrillation ([place])  Type 2 diabetes mellitus without complication, without long-term current use of insulin (HCC)  Rheumatoid arthritis involving both hands with positive rheumatoid factor (HCC)  High risk medication use  Other orders -     Discontinue: predniSONE (DELTASONE) 10 MG tablet; Take 5 daily for 3 days followed by 4,3,2 and 1 for 3 days each. -     predniSONE (DELTASONE) 10 MG tablet; Take 5 daily for 3 days followed by 4,3,2 and 1 for 3 days each.   I am having Ms. Belford maintain her metFORMIN, ALPRAZolam, potassium chloride SA, lisinopril, warfarin, dofetilide, niacin, magnesium oxide, calcium-vitamin D, anastrozole, nitroGLYCERIN, amLODipine, atorvastatin, ondansetron, pantoprazole, and predniSONE.  Meds ordered this encounter  Medications  . DISCONTD: predniSONE (DELTASONE) 10 MG tablet    Sig: Take 5 daily for 3 days followed by 4,3,2 and 1 for 3 days each.    Dispense:  45 tablet    Refill:  0  . predniSONE (DELTASONE) 10 MG tablet    Sig: Take 5 daily for 3 days followed by 4,3,2 and 1 for 3 days  each.    Dispense:  45 tablet  Refill:  0     Follow-up: Return if symptoms worsen or fail to improve.  Claretta Fraise, M.D.

## 2015-10-31 NOTE — Patient Instructions (Signed)
Do not mix any type of anti-inflammatory medicines such as ibuprofen with your warfarin for your blood thinner. Although these may help with the joint pain they are not safe because they can then your blood to much. There are a few exceptions to this that may be useful once you are tapered off of the prednisone. Please see Dr. Wendi Snipes and/or your rheumatologist to review those possibilities.  While taking prednisone it is very important that you check her blood sugar at least twice daily. If her sugars start to climb significantly above their usual levels you'll need to contact the office. Even though this did not happen the last time you were taking it it is possible that it will happen this time so please check carefully.  Follow-up with Dr. Wendi Snipes in the next few weeks for review of the anemia and the diabetes as well as arthritis treatment.

## 2015-11-01 ENCOUNTER — Encounter (HOSPITAL_COMMUNITY): Payer: Self-pay | Admitting: *Deleted

## 2015-11-01 ENCOUNTER — Ambulatory Visit (HOSPITAL_COMMUNITY): Payer: Medicare Other | Admitting: Anesthesiology

## 2015-11-01 ENCOUNTER — Encounter (HOSPITAL_COMMUNITY)
Admission: RE | Disposition: A | Discharge status: Home / Self Care | Payer: Self-pay | Source: Ambulatory Visit | Attending: Gastroenterology

## 2015-11-01 ENCOUNTER — Ambulatory Visit (HOSPITAL_COMMUNITY)
Admission: RE | Admit: 2015-11-01 | Discharge: 2015-11-01 | Disposition: A | Discharge status: Home / Self Care | Payer: Medicare Other | Source: Ambulatory Visit | Attending: Gastroenterology | Admitting: Gastroenterology

## 2015-11-01 ENCOUNTER — Ambulatory Visit: Payer: Medicare Other | Admitting: Family Medicine

## 2015-11-01 DIAGNOSIS — R1013 Epigastric pain: Secondary | ICD-10-CM | POA: Diagnosis not present

## 2015-11-01 DIAGNOSIS — I4891 Unspecified atrial fibrillation: Secondary | ICD-10-CM | POA: Diagnosis not present

## 2015-11-01 DIAGNOSIS — Z881 Allergy status to other antibiotic agents status: Secondary | ICD-10-CM | POA: Diagnosis not present

## 2015-11-01 DIAGNOSIS — Z91018 Allergy to other foods: Secondary | ICD-10-CM | POA: Diagnosis not present

## 2015-11-01 DIAGNOSIS — Z880 Allergy status to penicillin: Secondary | ICD-10-CM | POA: Insufficient documentation

## 2015-11-01 DIAGNOSIS — Q2733 Arteriovenous malformation of digestive system vessel: Secondary | ICD-10-CM

## 2015-11-01 DIAGNOSIS — E785 Hyperlipidemia, unspecified: Secondary | ICD-10-CM | POA: Diagnosis not present

## 2015-11-01 DIAGNOSIS — D539 Nutritional anemia, unspecified: Secondary | ICD-10-CM | POA: Diagnosis not present

## 2015-11-01 DIAGNOSIS — M1990 Unspecified osteoarthritis, unspecified site: Secondary | ICD-10-CM | POA: Diagnosis not present

## 2015-11-01 DIAGNOSIS — Z888 Allergy status to other drugs, medicaments and biological substances status: Secondary | ICD-10-CM | POA: Insufficient documentation

## 2015-11-01 DIAGNOSIS — Z9851 Tubal ligation status: Secondary | ICD-10-CM | POA: Insufficient documentation

## 2015-11-01 DIAGNOSIS — Z885 Allergy status to narcotic agent status: Secondary | ICD-10-CM | POA: Insufficient documentation

## 2015-11-01 DIAGNOSIS — I739 Peripheral vascular disease, unspecified: Secondary | ICD-10-CM | POA: Insufficient documentation

## 2015-11-01 DIAGNOSIS — Z853 Personal history of malignant neoplasm of breast: Secondary | ICD-10-CM | POA: Insufficient documentation

## 2015-11-01 DIAGNOSIS — I252 Old myocardial infarction: Secondary | ICD-10-CM | POA: Insufficient documentation

## 2015-11-01 DIAGNOSIS — I2510 Atherosclerotic heart disease of native coronary artery without angina pectoris: Secondary | ICD-10-CM | POA: Insufficient documentation

## 2015-11-01 DIAGNOSIS — Z955 Presence of coronary angioplasty implant and graft: Secondary | ICD-10-CM | POA: Insufficient documentation

## 2015-11-01 DIAGNOSIS — I10 Essential (primary) hypertension: Secondary | ICD-10-CM | POA: Insufficient documentation

## 2015-11-01 DIAGNOSIS — Z79899 Other long term (current) drug therapy: Secondary | ICD-10-CM | POA: Diagnosis not present

## 2015-11-01 DIAGNOSIS — Z8719 Personal history of other diseases of the digestive system: Secondary | ICD-10-CM | POA: Diagnosis not present

## 2015-11-01 DIAGNOSIS — K2950 Unspecified chronic gastritis without bleeding: Secondary | ICD-10-CM | POA: Diagnosis not present

## 2015-11-01 DIAGNOSIS — F329 Major depressive disorder, single episode, unspecified: Secondary | ICD-10-CM | POA: Insufficient documentation

## 2015-11-01 DIAGNOSIS — Z91040 Latex allergy status: Secondary | ICD-10-CM | POA: Insufficient documentation

## 2015-11-01 DIAGNOSIS — K2970 Gastritis, unspecified, without bleeding: Secondary | ICD-10-CM

## 2015-11-01 DIAGNOSIS — E119 Type 2 diabetes mellitus without complications: Secondary | ICD-10-CM | POA: Insufficient documentation

## 2015-11-01 DIAGNOSIS — Z87891 Personal history of nicotine dependence: Secondary | ICD-10-CM | POA: Diagnosis not present

## 2015-11-01 DIAGNOSIS — Z882 Allergy status to sulfonamides status: Secondary | ICD-10-CM | POA: Diagnosis not present

## 2015-11-01 DIAGNOSIS — F419 Anxiety disorder, unspecified: Secondary | ICD-10-CM | POA: Diagnosis not present

## 2015-11-01 DIAGNOSIS — Z886 Allergy status to analgesic agent status: Secondary | ICD-10-CM | POA: Diagnosis not present

## 2015-11-01 DIAGNOSIS — D649 Anemia, unspecified: Secondary | ICD-10-CM | POA: Diagnosis not present

## 2015-11-01 DIAGNOSIS — Z7901 Long term (current) use of anticoagulants: Secondary | ICD-10-CM | POA: Insufficient documentation

## 2015-11-01 DIAGNOSIS — Z7984 Long term (current) use of oral hypoglycemic drugs: Secondary | ICD-10-CM | POA: Insufficient documentation

## 2015-11-01 HISTORY — PX: ESOPHAGOGASTRODUODENOSCOPY (EGD) WITH PROPOFOL: SHX5813

## 2015-11-01 HISTORY — PX: BIOPSY: SHX5522

## 2015-11-01 LAB — GLUCOSE, CAPILLARY
GLUCOSE-CAPILLARY: 112 mg/dL — AB (ref 65–99)
GLUCOSE-CAPILLARY: 98 mg/dL (ref 65–99)

## 2015-11-01 SURGERY — ESOPHAGOGASTRODUODENOSCOPY (EGD) WITH PROPOFOL
Anesthesia: Monitor Anesthesia Care

## 2015-11-01 MED ORDER — LIDOCAINE HCL (PF) 1 % IJ SOLN
INTRAMUSCULAR | Status: AC
  Filled 2015-11-01: qty 5

## 2015-11-01 MED ORDER — GLYCOPYRROLATE 0.2 MG/ML IJ SOLN
0.2000 mg | Freq: Once | INTRAMUSCULAR | Status: AC
  Administered 2015-11-01: 0.2 mg via INTRAVENOUS

## 2015-11-01 MED ORDER — MIDAZOLAM HCL 2 MG/2ML IJ SOLN
1.0000 mg | INTRAMUSCULAR | Status: DC | PRN
  Administered 2015-11-01: 2 mg via INTRAVENOUS

## 2015-11-01 MED ORDER — LIDOCAINE VISCOUS 2 % MT SOLN
15.0000 mL | Freq: Once | OROMUCOSAL | Status: AC
  Administered 2015-11-01: 15 mL via OROMUCOSAL

## 2015-11-01 MED ORDER — PROPOFOL 10 MG/ML IV BOLUS
INTRAVENOUS | Status: AC
  Filled 2015-11-01: qty 40

## 2015-11-01 MED ORDER — STERILE WATER FOR IRRIGATION IR SOLN
Status: DC | PRN
  Administered 2015-11-01: 1000 mL

## 2015-11-01 MED ORDER — PROPOFOL 10 MG/ML IV BOLUS
INTRAVENOUS | Status: AC
  Filled 2015-11-01: qty 80

## 2015-11-01 MED ORDER — FENTANYL CITRATE (PF) 100 MCG/2ML IJ SOLN
25.0000 ug | INTRAMUSCULAR | Status: AC
  Administered 2015-11-01: 25 ug via INTRAVENOUS

## 2015-11-01 MED ORDER — GLYCOPYRROLATE 0.2 MG/ML IJ SOLN
INTRAMUSCULAR | Status: AC
  Filled 2015-11-01: qty 1

## 2015-11-01 MED ORDER — ONDANSETRON HCL 4 MG/2ML IJ SOLN: 4.0000 mg | Freq: Once | INTRAMUSCULAR | Status: DC | PRN

## 2015-11-01 MED ORDER — ONDANSETRON HCL 4 MG/2ML IJ SOLN
INTRAMUSCULAR | Status: AC
  Filled 2015-11-01: qty 2

## 2015-11-01 MED ORDER — LIDOCAINE VISCOUS 2 % MT SOLN
OROMUCOSAL | Status: AC
  Filled 2015-11-01: qty 15

## 2015-11-01 MED ORDER — LACTATED RINGERS IV SOLN
INTRAVENOUS | Status: DC
  Administered 2015-11-01: 08:00:00 via INTRAVENOUS

## 2015-11-01 MED ORDER — PROPOFOL 500 MG/50ML IV EMUL
INTRAVENOUS | Status: DC | PRN
  Administered 2015-11-01: 200 ug/kg/min via INTRAVENOUS

## 2015-11-01 MED ORDER — PROPOFOL 10 MG/ML IV BOLUS
INTRAVENOUS | Status: AC
  Filled 2015-11-01: qty 60

## 2015-11-01 MED ORDER — ONDANSETRON HCL 4 MG/2ML IJ SOLN
4.0000 mg | Freq: Once | INTRAMUSCULAR | Status: AC
  Administered 2015-11-01: 4 mg via INTRAVENOUS

## 2015-11-01 MED ORDER — FENTANYL CITRATE (PF) 100 MCG/2ML IJ SOLN
INTRAMUSCULAR | Status: AC
  Filled 2015-11-01: qty 2

## 2015-11-01 MED ORDER — FENTANYL CITRATE (PF) 100 MCG/2ML IJ SOLN
25.0000 ug | INTRAMUSCULAR | Status: DC | PRN
Start: 2015-11-01 — End: 2015-11-01

## 2015-11-01 MED ORDER — MIDAZOLAM HCL 2 MG/2ML IJ SOLN
INTRAMUSCULAR | Status: AC
  Filled 2015-11-01: qty 2

## 2015-11-01 MED ORDER — LIDOCAINE HCL (CARDIAC) 10 MG/ML IV SOLN
INTRAVENOUS | Status: DC | PRN
  Administered 2015-11-01: 30 mg via INTRAVENOUS

## 2015-11-01 SURGICAL SUPPLY — 19 items
BLOCK BITE 60FR ADLT L/F BLUE (MISCELLANEOUS) ×3 IMPLANT
ELECT REM PT RETURN 9FT ADLT (ELECTROSURGICAL)
ELECTRODE REM PT RTRN 9FT ADLT (ELECTROSURGICAL) IMPLANT
FLOOR PAD 36X40 (MISCELLANEOUS) ×3
FORCEPS BIOP RAD 4 LRG CAP 4 (CUTTING FORCEPS) ×3 IMPLANT
FORMALIN 10 PREFIL 20ML (MISCELLANEOUS) ×3 IMPLANT
KIT ENDO PROCEDURE PEN (KITS) ×3 IMPLANT
MANIFOLD NEPTUNE II (INSTRUMENTS) ×3 IMPLANT
NEEDLE SCLEROTHERAPY 25GX240 (NEEDLE) IMPLANT
PAD FLOOR 36X40 (MISCELLANEOUS) ×1 IMPLANT
PROBE APC STR FIRE (PROBE) IMPLANT
PROBE INJECTION GOLD (MISCELLANEOUS)
PROBE INJECTION GOLD 7FR (MISCELLANEOUS) IMPLANT
SNARE SHORT THROW 13M SML OVAL (MISCELLANEOUS) IMPLANT
SYR 50ML LL SCALE MARK (SYRINGE) ×3 IMPLANT
SYR INFLATION 60ML (SYRINGE) IMPLANT
TUBING INSUFFLATOR CO2MPACT (TUBING) ×3 IMPLANT
TUBING IRRIGATION ENDOGATOR (MISCELLANEOUS) ×3 IMPLANT
WATER STERILE IRR 1000ML POUR (IV SOLUTION) ×3 IMPLANT

## 2015-11-01 NOTE — Anesthesia Preprocedure Evaluation (Signed)
Anesthesia Evaluation  Patient identified by MRN, date of birth, ID band Patient awake    Reviewed: Allergy & Precautions, H&P , NPO status , Patient's Chart, lab work & pertinent test results  Airway Mallampati: II   Neck ROM: full    Dental  (+) Partial Upper, Missing, Dental Advisory Given,    Pulmonary former smoker,    Pulmonary exam normal + rhonchi        Cardiovascular hypertension, Pt. on medications + CAD, + Past MI, + Cardiac Stents and + Peripheral Vascular Disease  Normal cardiovascular exam+ dysrhythmias Atrial Fibrillation  Rhythm:Regular Rate:Normal     Neuro/Psych PSYCHIATRIC DISORDERS Anxiety Depression TIAnegative neurological ROS     GI/Hepatic negative GI ROS, Neg liver ROS,   Endo/Other  diabetes, Type 2, Oral Hypoglycemic Agents  Renal/GU negative Renal ROS  negative genitourinary   Musculoskeletal negative musculoskeletal ROS (+)   Abdominal   Peds  Hematology negative hematology ROS (+) anemia ,   Anesthesia Other Findings   Reproductive/Obstetrics                             Anesthesia Physical Anesthesia Plan  ASA: III  Anesthesia Plan: MAC   Post-op Pain Management:    Induction: Intravenous  Airway Management Planned: Simple Face Mask  Additional Equipment:   Intra-op Plan:   Post-operative Plan:   Informed Consent: I have reviewed the patients History and Physical, chart, labs and discussed the procedure including the risks, benefits and alternatives for the proposed anesthesia with the patient or authorized representative who has indicated his/her understanding and acceptance.     Plan Discussed with:   Anesthesia Plan Comments:         Anesthesia Quick Evaluation

## 2015-11-01 NOTE — Anesthesia Procedure Notes (Signed)
Procedure Name: MAC Date/Time: 11/01/2015 7:36 AM Performed by: Vista Deck Pre-anesthesia Checklist: Patient identified, Emergency Drugs available, Suction available, Timeout performed and Patient being monitored Patient Re-evaluated:Patient Re-evaluated prior to inductionOxygen Delivery Method: Non-rebreather mask

## 2015-11-01 NOTE — Op Note (Signed)
[facility] [address] [place], [address]   ENDOSCOPY PROCEDURE REPORT  PATIENT: Melody Casey, Melody Casey  MR#: [id number] BIRTHDATE: 05-25-1946 , 69  yrs. old GENDER: female  ENDOSCOPIST: Danie Binder, MD REFERRED IR:[id number] Laurance Flatten, M.D.  Lyman Bishop, M.D. PROCEDURE DATE: November 30, 2015 PROCEDURE:   EGD w/ biopsy  INDICATIONS:epigastric pain.  3 DOCUMENTED EPISODE OF DC/[place]ger DIVERTICULITIS SINCE 2012-TWO IN 2016. TAKING PROTONIX BID SINCE OCT 2016. IN [id number] FERRITIN 86. 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 LAST TCS 2015. ON COUMADIN FOR AFIB.  MEDICATIONS: Monitored anesthesia care   TOPICAL ANESTHETIC: Viscous Xylocaine ASA CLASS:  DESCRIPTION OF PROCEDURE:     Physical exam was performed.  Informed consent was obtained from the patient after explaining the benefits, risks, and alternatives to the procedure.  The patient was connected to the monitor and placed in the left lateral position.  Continuous oxygen was provided by nasal cannula and IV medicine administered through an indwelling cannula.  After administration of sedation, the patients esophagus was intubated and the     endoscope was advanced under direct visualization to the second portion of the duodenum.  The scope was removed slowly by carefully examining the color, texture, anatomy, and integrity of the mucosa on the way out.  The patient was recovered in endoscopy and discharged home in satisfactory condition.  Estimated blood loss is zero unless otherwise noted in this procedure report.     ESOPHAGUS: The mucosa of the esophagus appeared normal.   STOMACH: An arteriovenous malformation measuring 58m in size was found in the cardia.   Moderate non-erosive gastritis (inflammation) was found in the gastric antrum.  Multiple biopsies were performed using cold forceps.   DUODENUM: The duodenal mucosa showed no abnormalities in the bulb and 2nd part of the duodenum.  COMPLICATIONS:  There were no immediate complications.  ENDOSCOPIC IMPRESSION: 1.   EPIGASTRIC PAIN DUE TO GASTRITIS 2.   ONE GASTRIC Arteriovenous malformation  RECOMMENDATIONS: RE-START COUMADIN WED NOV 23. FOLLOW A LOW FAT DIET. PROTONIX 30 MINUTES PRIOR TO MEALS TWICE DAILY for 3 mos then once daily. AWAIT BIOPSY RESULTS. SEE SURGERY FOR L HEMICOLECTOMY. [facility] 2017[facility]  REPEAT EXAM:    eSigned:  SDanie Binder MD 1Dec 21, 20169:23 AM  CPT CODES: ICD CODES:  The ICD and CPT codes recommended by this software are interpretations from the data that the clinical staff has captured with the software.  The verification of the translation of this report to the ICD and CPT codes and modifiers is the sole responsibility of the health care institution and practicing physician where this report was generated.  [facility] will not be held responsible for the validity of the ICD and CPT codes included on this report.  AMA assumes no liability for data contained or not contained herein. CPT is a rDesigner, television/film setof the [employer]

## 2015-11-01 NOTE — Transfer of Care (Signed)
Immediate Anesthesia Transfer of Care Note  Patient: Melody Casey  Procedure(s) Performed: Procedure(s): ESOPHAGOGASTRODUODENOSCOPY (EGD) WITH PROPOFOL (N/A) BIOPSY (N/A)  Patient Location: PACU  Anesthesia Type:MAC  Level of Consciousness: awake and patient cooperative  Airway & Oxygen Therapy: Patient Spontanous Breathing and non-rebreather face mask  Post-op Assessment: Report given to RN, Post -op Vital signs reviewed and stable and Patient moving all extremities  Post vital signs: Reviewed and stable   Complications: No apparent anesthesia complications

## 2015-11-01 NOTE — Anesthesia Postprocedure Evaluation (Signed)
Anesthesia Post Note  Patient: Melody Casey  Procedure(s) Performed: Procedure(s) (LRB): ESOPHAGOGASTRODUODENOSCOPY (EGD) WITH PROPOFOL (N/A) BIOPSY (N/A)  Patient location during evaluation: PACU Anesthesia Type: MAC Level of consciousness: awake and alert Pain management: pain level controlled Vital Signs Assessment: post-procedure vital signs reviewed and stable Respiratory status: spontaneous breathing, nonlabored ventilation and respiratory function stable Cardiovascular status: stable Anesthetic complications: no    Last Vitals:  Filed Vitals:   11/01/15 0730 11/01/15 0800  BP: 110/62 101/75  Temp:  36.8 C  Resp: 20 16    Last Pain:  Filed Vitals:   11/01/15 0809  PainSc: 10-Worst pain ever                 Orvella Digiulio

## 2015-11-01 NOTE — H&P (Addendum)
Primary Care Physician:  Kenn File, MD Primary Gastroenterologist:  Dr. Oneida Alar  Pre-Procedure History & Physical: HPI:  Melody Casey is a 69 y.o. female here for  ABDOMINAL PAIN/DYSPEPSIA.  Past Medical History  Diagnosis Date  . HLD (hyperlipidemia)   . HTN (hypertension)   . Arthritis   . Foot deformity     right  . CAD (coronary artery disease)     a. NSTEMI 02/2011: occ mid Cx, DES to OM2, residual nonobst LAD dz.  . Tobacco abuse     stopped smoking 2012  . Diabetes mellitus   . Atrial fibrillation ([place])     a. Dx 03/2011 - on tikosyn/coumadin.  . Anxiety   . Diverticulitis of colon      2008, 04/2011, 12/2014, 08/2015  . Ureteral obstruction     History of gross hematuria/right hydronephrosis 2/2 to uteropelvic junction obstruction, s/p cystoscopy in January 2007 with bilateral retrograde pyelography, right ureter arthroscopy, right ureteral stent placement, bladder biopsies, stent removal since then.  . Wears dentures     top  . Cancer (HCC)     breast  . Diverticulitis   . Hemangioma     liver  . AAA (abdominal aortic aneurysm) [facility])     Past Surgical History  Procedure Laterality Date  . Tonsillectomy and adenoidectomy    . Knee arthroscopy      left  . Tubal ligation    . Right leg surgery  age 17    As a child for  ?scleroderma per patient  . Ureteral surgery  2011    rt ureterostomy-stent  . Coronary stent placement  2012  . Cardiac catheterization  2012    stent  . Breast lumpectomy with needle localization and axillary sentinel lymph node bx Right 03/01/2014    Procedure: BREAST LUMPECTOMY WITH NEEDLE LOCALIZATION AND AXILLARY SENTINEL LYMPH NODE BX;  Surgeon: Edward Jolly, MD;  Location: [facility];  Service: General;  Laterality: Right;  . Colonoscopy with propofol N/A 06/29/2014    Dr. Wynetta Emery: universal diverticulosis    Prior to Admission medications   Medication Sig Start Date End Date Taking? Authorizing Provider   ALPRAZolam Duanne Moron) 0.25 MG tablet Take 0.25 mg by mouth daily as needed for anxiety.    Yes Historical Provider, MD  amLODipine (NORVASC) 2.5 MG tablet TAKE ONE TABLET BY MOUTH ONCE DAILY IN THE MORNING 07/05/15  Yes Minus Breeding, MD  anastrozole (ARIMIDEX) 1 MG tablet Take 1 tablet (1 mg total) by mouth daily. 06/20/15  Yes Laurie Panda, NP  atorvastatin (LIPITOR) 40 MG tablet TAKE ONE AND ONE-HALF TABLETS BY MOUTH IN THE EVENING 08/22/15  Yes Evans Lance, MD  calcium-vitamin D (OSCAL WITH D) 500-200 MG-UNIT per tablet Take 1 tablet by mouth 2 (two) times daily.   Yes Historical Provider, MD  dofetilide (TIKOSYN) 250 MCG capsule Take 1 capsule (250 mcg total) by mouth 2 (two) times daily. 07/28/14  Yes Minus Breeding, MD  lisinopril (PRINIVIL,ZESTRIL) 40 MG tablet Take 40 mg by mouth every morning.    Yes Historical Provider, MD  magnesium oxide (MAG-OX) 400 MG tablet Take 400 mg twice a day with food 08/17/14  Yes Minus Breeding, MD  metFORMIN (GLUMETZA) 500 MG (MOD) 24 hr tablet Take 500 mg by mouth at bedtime.    Yes Historical Provider, MD  niacin (NIASPAN) 500 MG CR tablet Take 1 tablet (500 mg total) by mouth at bedtime. 08/11/14  Yes Minus Breeding, MD  ondansetron (ZOFRAN) 4 MG tablet Take 1 tablet (4 mg total) by mouth every 6 (six) hours as needed for nausea. Patient not taking: Reported on 10/31/2015 09/15/15  Yes Timmothy Euler, MD  pantoprazole (PROTONIX) 40 MG tablet Take 1 tablet (40 mg total) by mouth 2 (two) times daily before a meal. 10/03/15  Yes Orvil Feil, NP  potassium chloride SA (K-DUR,KLOR-CON) 20 MEQ tablet Take 20 mEq by mouth 2 (two) times daily.     Yes Historical Provider, MD  warfarin (COUMADIN) 5 MG tablet Take 5 mg by mouth daily. Take 5 mg everyday.   Yes Historical Provider, MD  nitroGLYCERIN (NITROSTAT) 0.4 MG SL tablet Place 1 tablet (0.4 mg total) under the tongue every 5 (five) minutes as needed for chest pain. 06/30/15   Minus Breeding, MD  predniSONE  (DELTASONE) 10 MG tablet Take 5 daily for 3 days followed by 4,3,2 and 1 for 3 days each. 10/31/15   Claretta Fraise, MD    Allergies as of 10/04/2015 - Review Complete 10/03/2015  Allergen Reaction Noted  . Miralax [polyethylene glycol] Swelling and Rash 06/14/2011  . Vancomycin Rash and Shortness Of Breath 08/18/2015  . Sulfa antibiotics Rash 10/24/2012  . Sulfacetamide sodium Rash 08/18/2015  . Acetaminophen Hives   . Banana Other (See Comments) 02/17/2014  . Oxycodone-acetaminophen Itching   . Penicillins Other (See Comments)   . Latex Rash 03/19/2014  . Tape Rash 03/12/2014    Family History  Problem Relation Age of Onset  . Colon cancer Neg Hx   . Liver disease Neg Hx   . Lung cancer Father 34    deceased  . Cancer Father   . Heart failure Mother 69    deceased  . Heart disease Mother   . Diabetes Brother     Social History   Social History  . Marital Status: Married    Spouse Name: N/A  . Number of Children: 2  . 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 Smoking status: Former Smoker -- 2.00 packs/day for 50 years    Types: Cigarettes    Quit date: 02/08/2011  . Smokeless tobacco: Never Used  . Alcohol Use: No  . Drug Use: No  . Sexual Activity: Not on file   Other Topics Concern  . 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     Review of Systems: See HPI, otherwise negative ROS   Physical Exam: Temp(Src) 98 F (36.7 C) (Oral) General:   Alert,  pleasant and cooperative in NAD Head:  Normocephalic and atraumatic. Neck:  Supple; Lungs:  Clear throughout to auscultation.    Heart:  Regular rate  Abdomen:  Soft, nontender and nondistended. Normal bowel sounds, without guarding, and without rebound.   Neurologic:  Alert and  oriented x4;  grossly normal neurologically.  Impression/Plan:     ABDOMINAL PAIN/DYSPEPSIA.  PLAN: 1. EGD  TODAY

## 2015-11-01 NOTE — Discharge Instructions (Signed)
You have gastritis. I biopsied your stomach.   RE-START COUMADIN WED NOV 23.  FOLLOW A LOW FAT DIET. SEE INFO BELOW.  CONTINUE PROTONIX. TAKE 30 MINUTES PRIOR TO MEALS TWICE DAILY for 3 mos then once daily.  YOUR BIOPSY RESULTS WILL BE AVAILABLE IN MY CHART AFTER NOV 24 AND MY OFFICE WILL CONTACT YOU IN 10-14 DAYS WITH YOUR RESULTS.   YOU SHOULD SEE DR. Arnoldo Morale OR [facility] TO HAVE YOUR LEFT COLON REMOVED DUE TO YOUR REPEATED BOUTS OF DIVERTICULITIS IN THE LEFT COLON.  FOLLOW UP IN 4 MOS.    UPPER ENDOSCOPY AFTER CARE Read the instructions outlined below and refer to this sheet in the next week. These discharge instructions provide you with general information on caring for yourself after you leave the hospital. While your treatment has been planned according to the most current medical practices available, unavoidable complications occasionally occur. If you have any problems or questions after discharge, call DR. Lanyla Costello, [phone].  ACTIVITY  You may resume your regular activity, but move at a slower pace for the next 24 hours.   Take frequent rest periods for the next 24 hours.   Walking will help get rid of the air and reduce the bloated feeling in your belly (abdomen).   No driving for 24 hours (because of the medicine (anesthesia) used during the test).   You may shower.   Do not sign any important legal documents or operate any machinery for 24 hours (because of the anesthesia used during the test).    NUTRITION  Drink plenty of fluids.   You may resume your normal diet as instructed by your doctor.   Begin with a light meal and progress to your normal diet. Heavy or fried foods are harder to digest and may make you feel sick to your stomach (nauseated).   Avoid alcoholic beverages for 24 hours or as instructed.    MEDICATIONS  You may resume your normal medications.   WHAT YOU CAN EXPECT TODAY  Some feelings of bloating in the abdomen.   Passage of  more gas than usual.    IF YOU HAD A BIOPSY TAKEN DURING THE UPPER ENDOSCOPY:  Eat a soft diet IF YOU HAVE NAUSEA, BLOATING, ABDOMINAL PAIN, OR VOMITING.    FINDING OUT THE RESULTS OF YOUR TEST Not all test results are available during your visit. DR. Oneida Alar WILL CALL YOU WITHIN 14 DAYS OF YOUR PROCEDUE WITH YOUR RESULTS. Do not assume everything is normal if you have not heard from DR. Sanaia Jasso, CALL HER OFFICE AT [phone].  SEEK IMMEDIATE MEDICAL ATTENTION AND CALL THE OFFICE: [phone] IF:  You have more than a spotting of blood in your stool.   Your belly is swollen (abdominal distention).   You are nauseated or vomiting.   You have a temperature over 101F.   You have abdominal pain or discomfort that is severe or gets worse throughout the day.   Gastritis  Gastritis is an inflammation (the body's way of reacting to injury and/or infection) of the stomach. It is often caused by bacterial (germ) infections. It can also be caused BY SEVERE ILLNESS, ASPIRIN, BC/GOODY POWDER'S, (IBUPROFEN) MOTRIN, OR ALEVE (NAPROXEN), chemicals (including alcohol), SPICY FOODS, and medications. This illness may be associated with generalized malaise (feeling tired, not well), UPPER ABDOMINAL STOMACH cramps, and fever. One common bacterial cause of gastritis is an organism known as H. Pylori. This can be treated with antibiotics.    Low-Fat Diet BREADS, CEREALS, PASTA, RICE, DRIED  PEAS, AND BEANS These products are high in carbohydrates and most are low in fat. Therefore, they can be increased in the diet as substitutes for fatty foods. They too, however, contain calories and should not be eaten in excess. Cereals can be eaten for snacks as well as for breakfast.  Include foods that contain fiber (fruits, vegetables, whole grains, and legumes). Research shows that fiber may lower blood cholesterol levels, especially the water-soluble fiber found in fruits, vegetables, oat products, and  legumes. FRUITS AND VEGETABLES It is good to eat fruits and vegetables. Besides being sources of fiber, both are rich in vitamins and some minerals. They help you get the daily allowances of these nutrients. Fruits and vegetables can be used for snacks and desserts. MEATS Limit lean meat, chicken, [place], and fish to no more than 6 ounces per day. Beef, Pork, and Lamb Use lean cuts of beef, pork, and lamb. Lean cuts include:  Extra-lean ground beef.  Arm roast.  Sirloin tip.  Center-cut ham.  Round steak.  Loin chops.  Rump roast.  Tenderloin.  Trim all fat off the outside of meats before cooking. It is not necessary to severely decrease the intake of red meat, but lean choices should be made. Lean meat is rich in protein and contains a highly absorbable form of iron. Premenopausal women, in particular, should avoid reducing lean red meat because this could increase the risk for low red blood cells (iron-deficiency anemia).  Chicken and [place] These are good sources of protein. The fat of poultry can be reduced by removing the skin and underlying fat layers before cooking. Chicken and [place] can be substituted for lean red meat in the diet. Poultry should not be fried or covered with high-fat sauces. Fish and Shellfish Fish is a good source of protein. Shellfish contain cholesterol, but they usually are low in saturated fatty acids. The preparation of fish is important. Like chicken and [place], they should not be fried or covered with high-fat sauces. EGGS Egg whites contain no fat or cholesterol. They can be eaten often. Try 1 to 2 egg whites instead of whole eggs in recipes or use egg substitutes that do not contain yolk.  MILK AND DAIRY PRODUCTS Use skim or 1% milk instead of 2% or whole milk. Decrease whole milk, natural, and processed cheeses. Use nonfat or low-fat (2%) cottage cheese or low-fat cheeses made from vegetable oils. Choose nonfat or low-fat (1 to 2%) yogurt. Experiment with  evaporated skim milk in recipes that call for heavy cream. Substitute low-fat yogurt or low-fat cottage cheese for sour cream in dips and salad dressings. Have at least 2 servings of low-fat dairy products, such as 2 glasses of skim (or 1%) milk each day to help get your daily calcium intake.  FATS AND OILS Butterfat, lard, and beef fats are high in saturated fat and cholesterol. These should be avoided.Vegetable fats do not contain cholesterol. AVOID coconut oil, palm oil, and palm kernel oil, WHICH are very high in saturated fats. These should be limited. These fats are often used in bakery goods, processed foods, popcorn, oils, and nondairy creamers. Vegetable shortenings and some peanut butters contain hydrogenated oils, which are also saturated fats. Read the labels on these foods and check for saturated vegetable oils.  Desirable liquid vegetable oils are corn oil, cottonseed oil, olive oil, canola oil, safflower oil, soybean oil, and sunflower oil. Peanut oil is not as good, but small amounts are acceptable. Buy a heart-healthy tub margarine that  has no partially hydrogenated oils in the ingredients. AVOID Mayonnaise and salad dressings often are made from unsaturated fats.  OTHER EATING TIPS Snacks  Most sweets should be limited as snacks. They tend to be rich in calories and fats, and their caloric content outweighs their nutritional value. Some good choices in snacks are graham crackers, melba toast, soda crackers, bagels (no egg), English muffins, fruits, and vegetables. These snacks are preferable to snack crackers, [place] fries, and chips. Popcorn should be air-popped or cooked in small amounts of liquid vegetable oil.  Desserts Eat fruit, low-fat yogurt, and fruit ices instead of pastries, cake, and cookies. Sherbet, angel food cake, gelatin dessert, frozen low-fat yogurt, or other frozen products that do not contain saturated fat (pure fruit juice bars, frozen ice pops) are also  acceptable.   COOKING METHODS Choose those methods that use little or no fat. They include: Poaching.  Braising.  Steaming.  Grilling.  Baking.  Stir-frying.  Broiling.  Microwaving.  Foods can be cooked in a nonstick pan without added fat, or use a nonfat cooking spray in regular cookware. Limit fried foods and avoid frying in saturated fat. Add moisture to lean meats by using water, broth, cooking wines, and other nonfat or low-fat sauces along with the cooking methods mentioned above. Soups and stews should be chilled after cooking. The fat that forms on top after a few hours in the refrigerator should be skimmed off. When preparing meals, avoid using excess salt. Salt can contribute to raising blood pressure in some people.  EATING AWAY FROM HOME Order entres, potatoes, and vegetables without sauces or butter. When meat exceeds the size of a deck of cards (3 to 4 ounces), the rest can be taken home for another meal. Choose vegetable or fruit salads and ask for low-calorie salad dressings to be served on the side. Use dressings sparingly. Limit high-fat toppings, such as bacon, crumbled eggs, cheese, sunflower seeds, and olives. Ask for heart-healthy tub margarine instead of butter.

## 2015-11-02 ENCOUNTER — Encounter (HOSPITAL_COMMUNITY): Payer: Self-pay | Admitting: Gastroenterology

## 2015-11-16 ENCOUNTER — Encounter: Payer: Self-pay | Admitting: Pharmacist

## 2015-11-16 ENCOUNTER — Ambulatory Visit (INDEPENDENT_AMBULATORY_CARE_PROVIDER_SITE_OTHER): Payer: Medicare Other | Admitting: Pharmacist

## 2015-11-16 VITALS — BP 124/62 | HR 62 | Ht 67.0 in | Wt 165.0 lb

## 2015-11-16 DIAGNOSIS — I481 Persistent atrial fibrillation: Secondary | ICD-10-CM

## 2015-11-16 DIAGNOSIS — I4819 Other persistent atrial fibrillation: Secondary | ICD-10-CM

## 2015-11-16 DIAGNOSIS — Z79899 Other long term (current) drug therapy: Secondary | ICD-10-CM

## 2015-11-16 DIAGNOSIS — Z0000 Encounter for general adult medical examination without abnormal findings: Secondary | ICD-10-CM

## 2015-11-16 LAB — POCT INR: INR: 2

## 2015-11-16 NOTE — Patient Instructions (Addendum)
  Melody Casey , Thank you for taking time to come for your Medicare Wellness Visit. I appreciate your ongoing commitment to your health goals. Please review the following plan we discussed and let me know if I can assist you in the future.   These are the goals we discussed: 1.  Important to have diabetic eye exam yearly - past due for appointment 2.  We are requesting record of your last DEXA from 04/2015. 3.  Continue to eat healthy and limit high carbohydrate foods Increase non-starchy vegetables - carrots, green bean, squash, zucchini, tomatoes, onions, peppers, lettuce, cucumbers, asparagus, okra (not fried), eggplant limit sugar and processed foods (cakes, cookies, ice cream, crackers and chips) Increase fresh fruit but limit serving sizes 1/2 cup or about the size of tennis or baseball limit red meat to no more than 1-2 times per week (serving size about the size of your palm) Choose whole grains / lean proteins - whole wheat bread, quinoa, whole grain rice (1/2 cup), fish, chicken, [place] 4.  Increase exercise as able to 15 or 20 minutes a day at least 4 to 5 days per week.    This is a list of the screening recommended for you and due dates:  Health Maintenance  Topic Date Due  . Complete foot exam   01/16/1956  . Eye exam for diabetics  01/16/1956  . Shingles Vaccine  01/17/2004  . DEXA scan (bone density measurement)  01/16/2009   . Flu Shot  12/15/2015*  . Pneumonia vaccines (1 of 2 - PCV13) 03/09/2016*  .  Hepatitis C: One time screening is recommended by Center for Disease Control  (CDC) for  adults born from [birth year] through 1967.   07/16/2016*  . Hemoglobin A1C  02/28/2016  . Mammogram  03/01/2016  . Urine Protein Check  08/17/2016  . Tetanus Vaccine  05/02/2022  . Colon Cancer Screening  06/29/2024  *Topic was postponed. The date shown is not the original due date.    Anticoagulation Dose Instructions as of 11/16/2015      Dorene Grebe Tue Wed Thu Fri Sat   New Dose 5 mg 5 mg  5 mg 5 mg 5 mg 5 mg 5 mg    Description        Continue to take '5mg'$  (1 tablet) of warfarin daily.     INR was 2.0 today

## 2015-11-16 NOTE — Progress Notes (Signed)
Patient ID: Melody Casey, female   DOB: Feb 06, 1946, 69 y.o.   MRN: [id number]    Subjective:   Melody Casey is a 69 y.o. female who presents for an Initial Medicare Annual Wellness Visit and to recheck INR / anticoagulation.    Patient is married.  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   Review of Systems  Review of Systems  Constitutional: Negative.   HENT: Positive for hearing loss. Negative for congestion, ear discharge, ear pain, nosebleeds, sore throat and tinnitus.   Eyes: Negative.   Respiratory: Negative.  Negative for stridor.   Cardiovascular: Negative.   Gastrointestinal: Negative.   Genitourinary: Positive for urgency.  Musculoskeletal: Negative.   Skin: Negative.   Neurological: Negative.  Negative for headaches.  Endo/Heme/Allergies: Negative.   Psychiatric/Behavioral: Negative.      Current Medications (verified) Outpatient Encounter Prescriptions as of 11/16/2015  Medication Sig  . ALPRAZolam (XANAX) 0.25 MG tablet Take 0.25 mg by mouth daily as needed for anxiety.   Marland Kitchen amLODipine (NORVASC) 2.5 MG tablet TAKE ONE TABLET BY MOUTH ONCE DAILY IN THE MORNING  . anastrozole (ARIMIDEX) 1 MG tablet Take 1 tablet (1 mg total) by mouth daily.  Marland Kitchen atorvastatin (LIPITOR) 40 MG tablet TAKE ONE AND ONE-HALF TABLETS BY MOUTH IN THE EVENING  . calcium-vitamin D (OSCAL WITH D) 500-200 MG-UNIT per tablet Take 1 tablet by mouth 2 (two) times daily.  Marland Kitchen dofetilide (TIKOSYN) 250 MCG capsule Take 1 capsule (250 mcg total) by mouth 2 (two) times daily.  Marland Kitchen lisinopril (PRINIVIL,ZESTRIL) 40 MG tablet Take 40 mg by mouth every morning.   . magnesium oxide (MAG-OX) 400 MG tablet Take 400 mg twice a day with food  . metFORMIN (GLUCOPHAGE) 500 MG tablet Take 500 mg by mouth daily.  . niacin (NIASPAN) 500 MG CR tablet Take 1 tablet (500 mg total) by mouth at bedtime.  . pantoprazole (PROTONIX) 40 MG tablet Take 1 tablet (40 mg total)  by mouth 2 (two) times daily before a meal.  . potassium chloride SA (K-DUR,KLOR-CON) 20 MEQ tablet Take 20 mEq by mouth 2 (two) times daily.    . predniSONE (DELTASONE) 10 MG tablet Take 5 daily for 3 days followed by 4,3,2 and 1 for 3 days each.  . nitroGLYCERIN (NITROSTAT) 0.4 MG SL tablet Place 1 tablet (0.4 mg total) under the tongue every 5 (five) minutes as needed for chest pain. (Patient not taking: Reported on 11/16/2015)  . ondansetron (ZOFRAN) 4 MG tablet Take 1 tablet (4 mg total) by mouth every 6 (six) hours as needed for nausea. (Patient not taking: Reported on 10/31/2015)  . [DISCONTINUED] metFORMIN (GLUMETZA) 500 MG (MOD) 24 hr tablet Take 500 mg by mouth at bedtime.    No facility-administered encounter medications on file as of 11/16/2015.    Allergies (verified) Miralax; Vancomycin; Sulfa antibiotics; Sulfacetamide sodium; Acetaminophen; Banana; Oxycodone-acetaminophen; Penicillins; Latex; and Tape   History: Past Medical History  Diagnosis Date  . HLD (hyperlipidemia)   . HTN (hypertension)   . Arthritis   . Foot deformity     right  . CAD (coronary artery disease)     a. NSTEMI 02/2011: occ mid Cx, DES to OM2, residual nonobst LAD dz.  . Tobacco abuse     stopped smoking 2012  . Diabetes mellitus   . Atrial fibrillation ([place])     a. Dx 03/2011 - on tikosyn/coumadin.  . Anxiety   .  Diverticulitis of colon      2008, 04/2011, 12/2014, 08/2015  . Ureteral obstruction     History of gross hematuria/right hydronephrosis 2/2 to uteropelvic junction obstruction, s/p cystoscopy in January 2007 with bilateral retrograde pyelography, right ureter arthroscopy, right ureteral stent placement, bladder biopsies, stent removal since then.  . Wears dentures     top  . Cancer (HCC)     breast  . Diverticulitis   . Hemangioma     liver  . AAA (abdominal aortic aneurysm) [facility])    Past Surgical History  Procedure Laterality Date  . Tonsillectomy and adenoidectomy    . Knee  arthroscopy      left  . Tubal ligation    . 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 Ureteral surgery  2011    rt ureterostomy-stent  . Coronary stent placement  2012  . Cardiac catheterization  2012    stent  . Breast lumpectomy with needle localization and axillary sentinel lymph node bx Right 03/01/2014    Procedure: BREAST LUMPECTOMY WITH NEEDLE LOCALIZATION AND AXILLARY SENTINEL LYMPH NODE BX;  Surgeon: Edward Jolly, MD;  Location: [facility];  Service: General;  Laterality: Right;  . Colonoscopy with propofol N/A 06/29/2014    Dr. Wynetta Emery: universal diverticulosis  . Esophagogastroduodenoscopy (egd) with propofol N/A 11/01/2015    Procedure: ESOPHAGOGASTRODUODENOSCOPY (EGD) WITH PROPOFOL;  Surgeon: Danie Binder, MD;  Location: [facility];  Service: Endoscopy;  Laterality: N/A;  . Esophageal biopsy N/A 11/01/2015    Procedure: BIOPSY;  Surgeon: Danie Binder, MD;  Location: [facility];  Service: Endoscopy;  Laterality: N/A;   Family History  Problem Relation Age of Onset  . Colon cancer Neg Hx   . Liver disease Neg Hx   . Lung cancer Father 54    deceased  . Cancer Father   . Heart failure Mother 61    deceased  . Heart disease Mother   . Diabetes Brother    Social History   Occupational History  . 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 Smoking status: Former Smoker -- 2.00 packs/day for 50 years    Types: Cigarettes    Quit date: 02/08/2011  . Smokeless tobacco: Never Used  . Alcohol Use: No  . Drug Use: No  . Sexual Activity: No    Do you feel safe at home?  Yes  Dietary issues and exercise activities: Current Exercise Habits:: Home exercise routine, Type of exercise: walking, Time (Minutes): 15, Frequency (Times/Week): 2, Weekly Exercise (Minutes/Week): 30, Intensity: Mild  Current Dietary habits:  Limits high salt and fat foods.     Objective:    Today's Vitals   11/16/15 1025  BP:  124/62  Pulse: 62  Height: '5\' 7"'$  (1.702 m)  Weight: 165 lb (74.844 kg)  PainSc: 0-No pain   Body mass index is 25.84 kg/(m^2).   INR was 2.0 today  Activities of Daily Living In your present state of health, do you have any difficulty performing the following activities: 11/16/2015 08/30/2015  Hearing? [place]? N -  Difficulty concentrating or making decisions? N -  Walking or climbing stairs? Y -  Dressing or bathing? N -  Doing errands, shopping? N N  Preparing Food and eating ? N -  Using the Toilet? N -  In the past six months, have you accidently leaked urine? Y -  Do you have problems with loss of bowel control? N -  Managing your Medications? N -  Managing your Finances? N -  Housekeeping or managing your Housekeeping? N -    Are there smokers in your home (other than you)? No   Cardiac Risk Factors include: advanced age (>50mn, >>23women);diabetes mellitus;dyslipidemia;hypertension;sedentary lifestyle  Depression Screen PHQ 2/9 Scores 11/16/2015 10/25/2015 09/15/2015 08/18/2015  PHQ - 2 Score '1 2 2 2  '$ PHQ- 9 Score - 4 8 -    Fall Risk Fall Risk  11/16/2015 09/15/2015 08/18/2015  Falls in the past year? 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  Diabetic Foot Exam - detailed  Diabetic Foot exam was performed with the following findings:  Yes 11/16/2015 10:33 AM  Visual Foot Exam completed.:  Yes  Is there a history of foot ulcer?:  No  Can the patient see the bottom of their feet?:  Yes  Are the shoes appropriate in style and fit?:  Yes  Is there swelling or and  abnormal foot shape?:  Yes (Comment: right foot derformity since childhood)  Are the toenails long?:  No  Are the toenails thick?:  Yes  Do you have pain in calf while walking?:  No  Is there a claw toe deformity?:  Yes  Is there elevated skin temparature?:  No  Is there limited skin dorsiflexion?:  (Comment: right foot)  Is there foot or ankle muscle weakness?:  Yes (Comment: right leg / foot)  Are the toenails ingrown?:  No  Normal Range of Motion:  Yes (Comment: yes for left foot but no for right foot)    Pulse Foot Exam completed.:  Yes  Right posterior Tibialias:  Present Left posterior Tibialias:  Present  Right Dorsalis Pedis:  Diminished Left Dorsalis Pedis:  Present  Sensory Foot Exam Completed.:  Yes  Swelling:  No  Semmes-Weinstein Monofilament Test  R Foot Test Control:  Neg L Foot Test Control:  Neg  R Site 1-Great Toe:  Neg L Site 1-Great Toe:  Pos  R Site 4:  Pos L Site 4:  Neg  R Site 5:  Pos L Site 5:  Neg         Immunizations and Health Maintenance Immunization History  Administered Date(s) Administered  . Tdap 05/02/2012   Health Maintenance Due  Topic Date Due  . FOOT EXAM  01/16/1954  . OPHTHALMOLOGY EXAM  01/16/1954  . ZOSTAVAX  01/17/2004  . DEXA SCAN  01/16/2009    Patient Care Team: STimmothy Euler MD as PCP - General (Family Medicine) SDanie Binder MD (Gastroenterology) WHurley Cisco MD as Consulting Physician (Rheumatology) JMinus Breeding MD as Consulting Physician (Cardiology) GChauncey Cruel MD as Consulting Physician (Oncology)  Indicate any recent Medical Services you may have received from other than [facility] providers in the past year (date may be approximate).  Podiatrist is Dr JDimas Millinwith [facility]    Assessment:    Annual Wellness Visit  Therapeutic anticoagulation  Screening Tests Health Maintenance  Topic Date Due  . FOOT EXAM  01/16/1954  . OPHTHALMOLOGY EXAM  01/16/1954  . ZOSTAVAX  01/17/2004    . DEXA SCAN  01/16/2009  . INFLUENZA VACCINE  12/15/2015 (Originally 07/11/2015)  . PNA vac Low Risk Adult (1 of 2 - PCV13) 03/09/2016 (Originally 01/16/2009)  . Hepatitis C Screening  07/16/2016 (Originally 11-29-1946)  . HEMOGLOBIN A1C  02/28/2016  . MAMMOGRAM  03/01/2016  . URINE MICROALBUMIN  08/17/2016  . TETANUS/TDAP  05/02/2022  . COLONOSCOPY  06/29/2024        Plan:   During the course of the visit Charrie was educated and counseled about the following appropriate screening and preventive services:   Vaccines to include Pneumoccal, Influenza, Hepatitis B, Td, Zostavax - patient refused all vaccinations  Colorectal cancer screening - both colonoscopy and FOBT are UTD  Cardiovascular disease screening - BP is at goal today.  Patient is due to have lipids rechecked but she isnot fasting today.  She is reminded to fast prior to next appt with PCP.  She is taking atorvastatin.  Diabetes screening - Last A1c was 7.5% (08/31/2015)  Patient is due eye exam.  Offered to make appt but she declined but states that she will make appt for January 2017  Foot exam performed today see notes.  Bone Denisty / Osteoporosis Screening - patient report having DEXA done 04/2015 - signed papers to request copy of report  Mammogram - UTD  PAP - N/A  Nutrition counseling- continue to limit high fat and CHO foods.    Smoking cessation counseling - patient quit smoking in 2012.    Advanced Directives - patient to bring in copy.  Increase exercise to 4 to 5 days per week.  Anticoagulation Dose Instructions as of 11/16/2015      Dorene Grebe Tue Wed Thu Fri Sat   New Dose 5 mg 5 mg 5 mg 5 mg 5 mg 5 mg 5 mg    Description        Continue to take '5mg'$  (1 tablet) of warfarin daily.     Recheck INR in 1 month  Patient Instructions (the written plan) were given to the patient.   Cherre Robins, [facility]   11/16/2015

## 2015-11-16 NOTE — Progress Notes (Signed)
Patient ID: Melody Casey, female   DOB: October 14, 1946, 69 y.o.   MRN: [id number]  Discussed patient's decreased hearing - she has appt with her ENT to discuss.

## 2015-11-16 NOTE — Patient Instructions (Signed)
  Melody Casey , Thank you for taking time to come for your Medicare Wellness Visit. I appreciate your ongoing commitment to your health goals. Please review the following plan we discussed and let me know if I can assist you in the future.   These are the goals we discussed: 1.  Important to have diabetic eye exam yearly - past due for appointment 2.  We are requesting record of your last DEXA from 04/2015. 3.  Continue to eat healthy and limit high carbohydrate foods Increase non-starchy vegetables - carrots, green bean, squash, zucchini, tomatoes, onions, peppers, lettuce, cucumbers, asparagus, okra (not fried), eggplant limit sugar and processed foods (cakes, cookies, ice cream, crackers and chips) Increase fresh fruit but limit serving sizes 1/2 cup or about the size of tennis or baseball limit red meat to no more than 1-2 times per week (serving size about the size of your palm) Choose whole grains / lean proteins - whole wheat bread, quinoa, whole grain rice (1/2 cup), fish, chicken, [place] 4.  Increase exercise as able to 15 or 20 minutes a day at least 4 to 5 days per week.    This is a list of the screening recommended for you and due dates:  Health Maintenance  Topic Date Due  . Complete foot exam   01/16/1954  . Eye exam for diabetics  01/16/1954  . Shingles Vaccine  01/17/2004  . DEXA scan (bone density measurement)  01/16/2009   . Flu Shot  12/15/2015*  . Pneumonia vaccines (1 of 2 - PCV13) 03/09/2016*  .  Hepatitis C: One time screening is recommended by Center for Disease Control  (CDC) for  adults born from 18 through 1965.   07/16/2016*  . Hemoglobin A1C  02/28/2016  . Mammogram  03/01/2016  . Urine Protein Check  08/17/2016  . Tetanus Vaccine  05/02/2022  . Colon Cancer Screening  06/29/2024  *Topic was postponed. The date shown is not the original due date.    Anticoagulation Dose Instructions as of 11/16/2015      Dorene Grebe Tue Wed Thu Fri Sat   New Dose 5 mg 5 mg  5 mg 5 mg 5 mg 5 mg 5 mg    Description        Continue to take '5mg'$  (1 tablet) of warfarin daily.     INR was 2.0 today

## 2015-11-18 ENCOUNTER — Telehealth: Payer: Self-pay | Admitting: Gastroenterology

## 2015-11-18 NOTE — Telephone Encounter (Signed)
Please call pt. HER stomach Bx shows gastritis. ON UPPER ENDOSCOPY SHE ALSO HAD A Arteriovenous Malformation(AVMs) A SUPERFICIAL COLLECTION OF BLOOD VESSELS ON THE SURFACE OF THE STOMACH. BOTH CAN BE A SOURCE OF BLOOD LOSS WHILE TAKING COUMADIN.   FOLLOW A LOW FAT DIET.  CONTINUE PROTONIX. TAKE 30 MINUTES PRIOR TO MEALS TWICE DAILY for 3 mos then once daily. SEE DR. Arnoldo Morale OR HOXWORTH TO HAVE YOUR LEFT COLON REMOVED DUE TO YOUR REPEATED BOUTS OF DIVERTICULITIS IN THE LEFT COLON. SHE NEEDS A CBC/FERRITIN IN FEB 2017. FOLLOW UP IN [facility] 2017 E30 FEDA, GASTRITIS.

## 2015-11-21 ENCOUNTER — Other Ambulatory Visit: Payer: Self-pay

## 2015-11-21 DIAGNOSIS — D509 Iron deficiency anemia, unspecified: Secondary | ICD-10-CM

## 2015-11-21 NOTE — Telephone Encounter (Signed)
ON RECALL  °

## 2015-11-22 NOTE — Telephone Encounter (Signed)
PLEASE CALL PT. She can SEE DR. HOXWORTH TO HAVE her LEFT COLON REMOVED DUE TO YOUR REPEATED BOUTS OF DIVERTICULITIS IN THE LEFT COLON.

## 2015-11-22 NOTE — Telephone Encounter (Signed)
LATE ENTRY: Informed pt yesterday. She does not want to see a surgeon at this time.

## 2015-11-22 NOTE — Telephone Encounter (Signed)
Pt called and said that she is asking someone in [place] to recommend her to a surgeon in [place], she would want the surgery at [facility], not [facility]. She will call if she has questions. She is aware we will send the lab order in Feb 2017.

## 2015-11-23 NOTE — Telephone Encounter (Signed)
Pt said OK to refer her to Dr. Excell Seltzer, and she would like for the appt to be after the first of the year.

## 2015-11-23 NOTE — Telephone Encounter (Signed)
LMOM to call.

## 2015-11-23 NOTE — Telephone Encounter (Signed)
REVIEWED-NO ADDITIONAL RECOMMENDATIONS. 

## 2015-11-24 ENCOUNTER — Other Ambulatory Visit: Payer: Self-pay

## 2015-11-24 DIAGNOSIS — K5732 Diverticulitis of large intestine without perforation or abscess without bleeding: Secondary | ICD-10-CM

## 2015-11-24 NOTE — Telephone Encounter (Signed)
Referral sent to Dr. Excell Seltzer.  Faxed clinical notes.   Spoke with pt and she is aware of referral and appointment. (12/16/2015 '@345pm'$ 

## 2015-11-25 ENCOUNTER — Encounter: Payer: Self-pay | Admitting: Family Medicine

## 2015-11-25 ENCOUNTER — Ambulatory Visit (INDEPENDENT_AMBULATORY_CARE_PROVIDER_SITE_OTHER): Payer: Medicare Other | Admitting: Family Medicine

## 2015-11-25 VITALS — BP 120/66 | HR 70 | Temp 97.2°F | Ht 67.0 in | Wt 170.0 lb

## 2015-11-25 DIAGNOSIS — R51 Headache: Secondary | ICD-10-CM

## 2015-11-25 DIAGNOSIS — R519 Headache, unspecified: Secondary | ICD-10-CM

## 2015-11-25 DIAGNOSIS — I2510 Atherosclerotic heart disease of native coronary artery without angina pectoris: Secondary | ICD-10-CM

## 2015-11-25 NOTE — Progress Notes (Signed)
   HPI  Patient presents today here to discuss scalp pain.  Patient explains that for 4-5 days she's been having paroxysmal left scalp pain, she describes this with her hands in the V1 distribution.  She describes it as a sharp electric type pain that lasts only 2-3 seconds and immediately goes away, it seems to come in groups or paroxysms She's not noticed any high-grade factors. She's been taking Advil for it on 3 different occasions which has not seemed to help much.  She denies slurred speech, difficulty thinking, weakness or numbness on one side of body or the other.  She would like to wait on starting medications if this becomes persistent  PMH: Smoking status noted ROS: Per HPI  Objective: BP 120/66 mmHg  Pulse 70  Temp(Src) 97.2 F (36.2 C) (Oral)  Ht '5\' 7"'$  (1.702 m)  Wt 170 lb (77.111 kg)  BMI 26.62 kg/m2 Gen: NAD, alert, cooperative with exam HEENT: NCAT, EOMI, PERRL left and right TM obscured by cerumen,  CV: RRR, good S1/S2, no murmur Resp: CTABL, no wheezes, non-labored Ext: No edema, warm Neuro: Alert and oriented,  no pronator drift, negative Romberg, strength 5/5 and sensation intact in all 4 extremities, cranial nerves II through XII intact  Assessment and plan:  # Scalp pain, likely trigeminal neuralgia discussed trigeminal neuralgia with her, also discussed other possibilities and worrisome signs for stroke. She has no signs of a stroke today and her neuro exam is reassuring. Her pain is electric type pain that lasts only seconds in the V1 distribution If this becomes persistent would consider gabapentin as well as MRI versus neurology referral  she will return to clinic if worsens or does not improve as expected.   Laroy Apple, MD [facility] 11/25/2015, 2:25 PM

## 2015-11-25 NOTE — Patient Instructions (Signed)
Great to see you!   It is difficult to say exactly what is going on with your pain, however it sounds most similar to trigeminal neuralgia   If it becomes persistent please let me know. We can try gabapentin, a nerve pain medicine as well as send you to a neurologist.     Trigeminal Neuralgia Trigeminal neuralgia is a nerve disorder that causes attacks of severe facial pain. The attacks last from a few seconds to several minutes. They can happen for days, weeks, or months and then go away for months or years. Trigeminal neuralgia is also called tic douloureux. CAUSES This condition is caused by damage to a nerve in the face that is called the trigeminal nerve. An attack can be triggered by:  Talking.  Chewing.  Putting on makeup.  Washing your face.  Shaving your face.  Brushing your teeth.  Touching your face. RISK FACTORS This condition is more likely to develop in:  Women.  People who are 80 years of age or older. SYMPTOMS The main symptom of this condition is pain in the jaw, lips, eyes, nose, scalp, forehead, and face. The pain may be intense, stabbing, electric, or shock-like. DIAGNOSIS This condition is diagnosed with a physical exam. A CT scan or MRI may be done to rule out other conditions that can cause facial pain. TREATMENT This condition may be treated with:  Avoiding the things that trigger your attacks.  Pain medicine.  Surgery. This may be done in severe cases if other medical treatment does not provide relief. HOME CARE INSTRUCTIONS  Take over-the-counter and prescription medicines only as told by your health care provider.  If you wish to get pregnant, talk with your health care provider before you start trying to get pregnant.  Avoid the things that trigger your attacks. It may help to:  Chew on the unaffected side of your mouth.  Avoid touching your face.  Avoid blasts of hot or cold air. SEEK MEDICAL CARE IF:  Your pain medicine is not  helping.  You develop new, unexplained symptoms, such as:  Double vision.  Facial weakness.  Changes in hearing or balance.  You become pregnant. SEEK IMMEDIATE MEDICAL CARE IF:  Your pain is unbearable, and your pain medicine does not help.   This information is not intended to replace advice given to you by your health care provider. Make sure you discuss any questions you have with your health care provider.   Document Released: 11/23/2000 Document Revised: 08/17/2015 Document Reviewed: 03/21/2015 Elsevier Interactive Patient Education [employer].

## 2015-11-30 ENCOUNTER — Encounter: Payer: Self-pay | Admitting: Internal Medicine

## 2015-12-16 ENCOUNTER — Other Ambulatory Visit: Payer: Self-pay

## 2015-12-16 ENCOUNTER — Other Ambulatory Visit: Payer: Self-pay | Admitting: *Deleted

## 2015-12-16 DIAGNOSIS — D509 Iron deficiency anemia, unspecified: Secondary | ICD-10-CM

## 2015-12-16 DIAGNOSIS — C50411 Malignant neoplasm of upper-outer quadrant of right female breast: Secondary | ICD-10-CM

## 2015-12-19 ENCOUNTER — Other Ambulatory Visit: Payer: Medicare Other

## 2015-12-19 ENCOUNTER — Ambulatory Visit (HOSPITAL_BASED_OUTPATIENT_CLINIC_OR_DEPARTMENT_OTHER): Payer: Self-pay | Admitting: Oncology

## 2015-12-19 DIAGNOSIS — C50411 Malignant neoplasm of upper-outer quadrant of right female breast: Secondary | ICD-10-CM

## 2015-12-19 NOTE — Progress Notes (Signed)
No show likely due to weather

## 2015-12-21 DIAGNOSIS — L603 Nail dystrophy: Secondary | ICD-10-CM | POA: Diagnosis not present

## 2015-12-21 DIAGNOSIS — E1151 Type 2 diabetes mellitus with diabetic peripheral angiopathy without gangrene: Secondary | ICD-10-CM | POA: Diagnosis not present

## 2015-12-21 DIAGNOSIS — I739 Peripheral vascular disease, unspecified: Secondary | ICD-10-CM | POA: Diagnosis not present

## 2015-12-22 ENCOUNTER — Encounter: Payer: Self-pay | Admitting: Family Medicine

## 2015-12-26 ENCOUNTER — Ambulatory Visit (INDEPENDENT_AMBULATORY_CARE_PROVIDER_SITE_OTHER): Payer: Medicare Other | Admitting: Family Medicine

## 2015-12-26 ENCOUNTER — Encounter: Payer: Self-pay | Admitting: Family Medicine

## 2015-12-26 VITALS — BP 132/78 | HR 66 | Temp 97.5°F | Ht 67.0 in | Wt 169.0 lb

## 2015-12-26 DIAGNOSIS — I481 Persistent atrial fibrillation: Secondary | ICD-10-CM

## 2015-12-26 DIAGNOSIS — M05742 Rheumatoid arthritis with rheumatoid factor of left hand without organ or systems involvement: Secondary | ICD-10-CM | POA: Diagnosis not present

## 2015-12-26 DIAGNOSIS — Z79899 Other long term (current) drug therapy: Secondary | ICD-10-CM | POA: Diagnosis not present

## 2015-12-26 DIAGNOSIS — E118 Type 2 diabetes mellitus with unspecified complications: Secondary | ICD-10-CM | POA: Diagnosis not present

## 2015-12-26 DIAGNOSIS — M05741 Rheumatoid arthritis with rheumatoid factor of right hand without organ or systems involvement: Secondary | ICD-10-CM

## 2015-12-26 DIAGNOSIS — I4819 Other persistent atrial fibrillation: Secondary | ICD-10-CM

## 2015-12-26 LAB — POCT GLYCOSYLATED HEMOGLOBIN (HGB A1C): Hemoglobin A1C: 6.1

## 2015-12-26 LAB — POCT INR: INR: 1.7

## 2015-12-26 MED ORDER — PREDNISONE 10 MG PO TABS: ORAL_TABLET | ORAL | Status: DC

## 2015-12-26 NOTE — Progress Notes (Signed)
   HPI  Patient presents today to discuss diabetes, rheumatoid arthritis, and an INR check.  Diabetes Watching her diet closely Good metformin compliance Fasting blood sugar ranges 100 120. No foot or hand numbness.  Rheumatoid arthritis She notes that she's having a flare, she's had bilateral hand stiffness and right shoulder stiffness for about 2 weeks. She's been taking ibuprofen.  A. 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 Continue watching diet aggressively She understands that prednisone will decrease her control little bit  # Rheumatoid arthritis With current flare Prednisone burst She has been contacting her rheumatologist  # A. fib, chronic anticoagulation Increase Coumadin dose by 2.5 mg per week. She understands her dosing very well.   Orders Placed This Encounter  Procedures  . POCT glycosylated hemoglobin (Hb A1C)    Meds ordered this encounter  Medications  . predniSONE (DELTASONE) 10 MG tablet    Sig: Take 5 daily for 3 days followed by 4,3,2 and 1 for 3 days each.    Dispense:  45 tablet    Refill:  0    Laroy Apple, MD [facility] 12/26/2015, 8:45 AM

## 2015-12-26 NOTE — Patient Instructions (Addendum)
Anticoagulation Dose Instructions as of 12/26/2015      Melody Casey Tue Wed Thu Fri Sat   New Dose 5 mg 5 mg 7.5 mg 5 mg 5 mg 5 mg 5 mg    Description        Take 1 and 1/2 tablets today, then continue to take '5mg'$  (1 tablet) of warfarin daily with 1.5 tabs on tuesdays.     INR was 1.7 today.  Take 1.5 tabs on tuesdays, otherwise take 1 pill daily   I have sent prednisone to the pharmacy  Please come back to see me in 3 months Have the eye doctor send [place] notes when you see them  Great to see you!

## 2015-12-28 ENCOUNTER — Telehealth: Payer: Self-pay | Admitting: Oncology

## 2015-12-28 NOTE — Telephone Encounter (Signed)
Spoke with patient r/s appointment 1/9 to 1/25

## 2016-01-04 ENCOUNTER — Ambulatory Visit (HOSPITAL_BASED_OUTPATIENT_CLINIC_OR_DEPARTMENT_OTHER): Payer: Medicare Other | Admitting: Oncology

## 2016-01-04 ENCOUNTER — Telehealth: Payer: Self-pay | Admitting: Oncology

## 2016-01-04 ENCOUNTER — Other Ambulatory Visit (HOSPITAL_BASED_OUTPATIENT_CLINIC_OR_DEPARTMENT_OTHER): Payer: Medicare Other

## 2016-01-04 VITALS — BP 119/55 | HR 63 | Temp 97.5°F | Resp 18 | Ht 67.0 in | Wt 169.8 lb

## 2016-01-04 DIAGNOSIS — C50411 Malignant neoplasm of upper-outer quadrant of right female breast: Secondary | ICD-10-CM

## 2016-01-04 DIAGNOSIS — Z170 Estrogen receptor positive status [ER+]: Secondary | ICD-10-CM

## 2016-01-04 DIAGNOSIS — M810 Age-related osteoporosis without current pathological fracture: Secondary | ICD-10-CM | POA: Diagnosis not present

## 2016-01-04 DIAGNOSIS — Z79811 Long term (current) use of aromatase inhibitors: Secondary | ICD-10-CM | POA: Diagnosis not present

## 2016-01-04 LAB — COMPREHENSIVE METABOLIC PANEL
ALBUMIN: 3.1 g/dL — AB (ref 3.5–5.0)
ALT: 21 U/L (ref 0–55)
AST: 17 U/L (ref 5–34)
Alkaline Phosphatase: 105 U/L (ref 40–150)
Anion Gap: 8 mEq/L (ref 3–11)
BUN: 23.8 mg/dL (ref 7.0–26.0)
CALCIUM: 9.6 mg/dL (ref 8.4–10.4)
CHLORIDE: 104 meq/L (ref 98–109)
CO2: 24 mEq/L (ref 22–29)
Creatinine: 1 mg/dL (ref 0.6–1.1)
EGFR: 58 mL/min/{1.73_m2} — AB (ref >90)
Glucose: 195 mg/dl — ABNORMAL HIGH (ref 70–140)
POTASSIUM: 4.9 meq/L (ref 3.5–5.1)
SODIUM: 135 meq/L — AB (ref 136–145)
Total Bilirubin: 0.38 mg/dL (ref 0.20–1.20)
Total Protein: 7.6 g/dL (ref 6.4–8.3)

## 2016-01-04 LAB — CBC WITH DIFFERENTIAL/PLATELET
BASO%: 0.1 % (ref 0.0–2.0)
BASOS ABS: 0 10*3/uL (ref 0.0–0.1)
EOS%: 0.2 % (ref 0.0–7.0)
Eosinophils Absolute: 0 10*3/uL (ref 0.0–0.5)
HEMATOCRIT: 33.6 % — AB (ref 34.8–46.6)
HEMOGLOBIN: 10.8 g/dL — AB (ref 11.6–15.9)
LYMPH#: 1.2 10*3/uL (ref 0.9–3.3)
LYMPH%: 12.5 % — AB (ref 14.0–49.7)
MCH: 24.2 pg — AB (ref 25.1–34.0)
MCHC: 32.1 g/dL (ref 31.5–36.0)
MCV: 75.2 fL — AB (ref 79.5–101.0)
MONO#: 0.4 10*3/uL (ref 0.1–0.9)
MONO%: 3.9 % (ref 0.0–14.0)
NEUT#: 8.2 10*3/uL — ABNORMAL HIGH (ref 1.5–6.5)
NEUT%: 83.3 % — AB (ref 38.4–76.8)
Platelets: 201 10*3/uL (ref 145–400)
RBC: 4.47 10*6/uL (ref 3.70–5.45)
RDW: 17.4 % — ABNORMAL HIGH (ref 11.2–14.5)
WBC: 9.8 10*3/uL (ref 3.9–10.3)
nRBC: 0 % (ref 0–0)

## 2016-01-04 MED ORDER — ANASTROZOLE 1 MG PO TABS: 1.0000 mg | ORAL_TABLET | Freq: Every day | ORAL | Status: DC

## 2016-01-04 NOTE — Telephone Encounter (Signed)
Appointments made and avs printed for patient °

## 2016-01-04 NOTE — Progress Notes (Signed)
[place]  [phone] [phone]     ID: [name] OB: 05-10-1946  MR#: [id number]  YTK#:[id number]  PCP: Kenn File, MD GYN:   SU: Excell Seltzer OTHER MD: [name], Arley Phenix, Christene Slates, Evelena Peat truslow  CHIEF COMPLAINT: Estrogen receptor positive breast cancer   CURRENT TREATMENT: Antiestrogen therapy  BREAST CANCER HISTORY: From the original intake note:   "Melody Casey" had routine breast mammography at [facility] 01/14/2014. The breast density was category B. A new cluster of heterogeneous calcifications were noted in the right breast and on 01/28/2014 the patient underwent stereotactic biopsy of this area with the pathology ([id number]) showing an invasive ductal carcinoma, grade 1, estrogen receptor 100% positive, with strong staining intensity; progesterone receptor 63% positive, with moderate staining intensity, with an MIB-1 of 26% and no HER-2 amplification, the ratio being 1.44 and the number per cell 2.80.  On 02/01/2014 the patient underwent bilateral MRI of the breast, with no enhancement associated with the previously biopsied area. However there was a separate 7 mm enhancing mass in the lower inner quadrant and biopsy of this mass 02/03/2014 showed benign breast parenchyma, with no atypia. Review of this pathology showed atypical ductal hyperplasia, and therefore excisional biopsy was recommended, performed 02/10/2014 and showing only atypical ductal hyperplasia, with adenosis and calcifications ([id number]).  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 Tumor.   Her subsequent history is as detailed below  INTERVAL HISTORY: Melody Casey returns today for follow up of her estrogen receptor positive breast cancer. She continues on anastrozole, which she  tolerates well. Hot flashes and vaginal dryness are not major issues. She obtains it at an excellent price.  REVIEW OF SYSTEMS: "2016 was a hard year". Her husband had back surgery in September. He then had prolonged urinary retention which required a Foley. That has resolved. In the meantime she developed sepsis and later diverticular disease. That also has improved. She tells me she is now diagnosed with rheumatoid arthritis and is seeing Dr. Charlestine Night, who has her currently on a prednisone taper. She has significant left knee problems which keep her from walking for exercise. Her diabetes is doing "okay". A detailed review of systems today was otherwise stable  PAST MEDICAL HISTORY: Past Medical History  Diagnosis Date  . HLD (hyperlipidemia)   . HTN (hypertension)   . Arthritis   . Foot deformity     right  . CAD (coronary artery disease)     a. NSTEMI 02/2011: occ mid Cx, DES to OM2, residual nonobst LAD dz.  . Tobacco abuse     stopped smoking 2012  . Diabetes mellitus   . Atrial fibrillation ([place])     a. Dx 03/2011 - on tikosyn/coumadin.  . Anxiety   . Diverticulitis of colon      2008, 04/2011, 12/2014, 08/2015  . Ureteral obstruction     History of gross hematuria/right hydronephrosis 2/2 to uteropelvic junction obstruction, s/p cystoscopy in January 2007 with bilateral retrograde pyelography, right ureter arthroscopy, right ureteral stent placement, bladder biopsies, stent removal since then.  . Wears dentures     top  . Cancer (HCC)     breast  . Diverticulitis   . Hemangioma     liver  . AAA (abdominal aortic aneurysm) ([place])   . Osteoporosis  PAST SURGICAL HISTORY: Past Surgical History  Procedure Laterality Date  . Tonsillectomy and adenoidectomy    . Knee arthroscopy      left  . Tubal ligation    . Right leg surgery  age 48    As a child for  ?scleroderma per patient  . Ureteral surgery  2011    rt ureterostomy-stent  . Coronary stent placement  2012  .  Cardiac catheterization  2012    stent  . Breast lumpectomy with needle localization and axillary sentinel lymph node bx Right 03/01/2014    Procedure: BREAST LUMPECTOMY WITH NEEDLE LOCALIZATION AND AXILLARY SENTINEL LYMPH NODE BX;  Surgeon: Edward Jolly, MD;  Location: [place];  Service: General;  Laterality: Right;  . Colonoscopy with propofol N/A 06/29/2014    Dr. Wynetta Emery: universal diverticulosis  . Esophagogastroduodenoscopy (egd) with propofol N/A 11/01/2015    Procedure: ESOPHAGOGASTRODUODENOSCOPY (EGD) WITH PROPOFOL;  Surgeon: Danie Binder, MD;  Location: [facility];  Service: Endoscopy;  Laterality: N/A;  . Esophageal biopsy N/A 11/01/2015    Procedure: BIOPSY;  Surgeon: Danie Binder, MD;  Location: [facility];  Service: Endoscopy;  Laterality: N/A;    FAMILY HISTORY Family History  Problem Relation Age of Onset  . Colon cancer Neg Hx   . Liver disease Neg Hx   . Lung cancer Father 69    deceased  . Cancer Father   . Heart failure Mother 16    deceased  . Heart disease Mother   . Diabetes Brother   The patient's father died at the age of 57 with a history of lung cancer. He was a smoker and also had asbestos exposure. The patient's mother died at the age of 79. The patient had one brother. There is no other history of cancer in the family to her knowledge  GYNECOLOGIC HISTORY:  Menarche age 75, first live birth age 46. The patient stopped having periods approximately 1997. She did not take hormone replacement.   SOCIAL HISTORY:  Melody Casey works part time for [employer]. Her husband of 20+ years, [name] "[name]" Zobel is a retired Futures trader. He has a history of head and neck cancer treated at [facility] years ago. Daughter Nasim Garofano lives in [name] and is a Scientist, clinical (histocompatibility and immunogenetics). Son [name] lives in [name] and he is an Conservator, museum/gallery. The patient has 7 grandchildren and 2  great-grandchildren. She attends [employer]     ADVANCED DIRECTIVES:  in place    HEALTH MAINTENANCE: Social History  Substance Use Topics  . Smoking status: Former Smoker -- 2.00 packs/day for 50 years    Types: Cigarettes    Quit date: 02/08/2011  . Smokeless tobacco: Never Used  . Alcohol Use: No     Colonoscopy: never   PAP: 2013  Bone density:Never  Lipid panel:  Allergies  Allergen Reactions  . Miralax [Polyethylene Glycol] Swelling and Rash    Took [facility] brand developed rash. Patient states she tolerated name brand MiraLax in the past.  09/01/15. Patient states she had diffuse swelling including swelling of her lips.   . Vancomycin Rash and Shortness Of Breath  . Sulfa Antibiotics Rash    All over rash  . Sulfacetamide Sodium Rash    All over rash  . Acetaminophen Hives  . Banana Other (See Comments)    Unknown  . Oxycodone-Acetaminophen Itching  . Penicillins Other (See Comments)    Pt  had so much as a child she's immune to it  . Latex Rash  . Tape Rash    Current Outpatient Prescriptions  Medication Sig Dispense Refill  . ALPRAZolam (XANAX) 0.25 MG tablet Take 0.25 mg by mouth daily as needed for anxiety.     Marland Kitchen amLODipine (NORVASC) 2.5 MG tablet TAKE ONE TABLET BY MOUTH ONCE DAILY IN THE MORNING 30 tablet 6  . anastrozole (ARIMIDEX) 1 MG tablet Take 1 tablet (1 mg total) by mouth daily. 90 tablet 3  . atorvastatin (LIPITOR) 40 MG tablet TAKE ONE AND ONE-HALF TABLETS BY MOUTH IN THE EVENING 135 tablet 1  . calcium-vitamin D (OSCAL WITH D) 500-200 MG-UNIT per tablet Take 1 tablet by mouth 2 (two) times daily.    Marland Kitchen dofetilide (TIKOSYN) 250 MCG capsule Take 1 capsule (250 mcg total) by mouth 2 (two) times daily. 60 capsule 11  . lisinopril (PRINIVIL,ZESTRIL) 40 MG tablet Take 40 mg by mouth every morning.     . magnesium oxide (MAG-OX) 400 MG tablet Take 400 mg twice a day with food 60 tablet 6  . metFORMIN (GLUCOPHAGE) 500 MG tablet Take 500 mg by  mouth daily.    . niacin (NIASPAN) 500 MG CR tablet Take 1 tablet (500 mg total) by mouth at bedtime. 60 tablet 3  . nitroGLYCERIN (NITROSTAT) 0.4 MG SL tablet Place 1 tablet (0.4 mg total) under the tongue every 5 (five) minutes as needed for chest pain. 25 tablet 5  . ondansetron (ZOFRAN) 4 MG tablet Take 1 tablet (4 mg total) by mouth every 6 (six) hours as needed for nausea. 20 tablet 0  . pantoprazole (PROTONIX) 40 MG tablet Take 1 tablet (40 mg total) by mouth 2 (two) times daily before a meal. 60 tablet 3  . potassium chloride SA (K-DUR,KLOR-CON) 20 MEQ tablet Take 20 mEq by mouth 2 (two) times daily.      . predniSONE (DELTASONE) 10 MG tablet Take 5 daily for 3 days followed by 4,3,2 and 1 for 3 days each. 45 tablet 0   No current facility-administered medications for this visit.    OBJECTIVE: Older white woman who appears stated age 36 Vitals:   01/04/16 1002  BP: 119/55  Pulse: 63  Temp: 97.5 F (36.4 C)  Resp: 18     Body mass index is 26.59 kg/(m^2).    ECOG FS:1 - Symptomatic but completely ambulatory   Sclerae unicteric, pupils round and equal Oropharynx clear and moist-- no thrush or other lesions No cervical or supraclavicular adenopathy Lungs no rales or rhonchi Heart regular rate and rhythm Abd soft, nontender, positive bowel sounds MSK no focal spinal tenderness, no upper extremity lymphedema Neuro: nonfocal, well oriented, appropriate affect Breasts: The right breast is status post lumpectomy. There is no evidence of local recurrence. The cosmetic recalled is excellent. The right axilla is benign per the left breast is unremarkable.    LAB RESULTS:  CMP     Component Value Date/Time   NA 135* 01/04/2016 0946   NA 137 10/15/2015 1043   NA 133* 09/04/2015 0544   K 4.9 01/04/2016 0946   K 4.9 10/15/2015 1043   CL 101 10/15/2015 1043   CO2 24 01/04/2016 0946   CO2 22 10/15/2015 1043   GLUCOSE 195* 01/04/2016 0946   GLUCOSE 88 10/15/2015 1043    GLUCOSE 107* 09/04/2015 0544   BUN 23.8 01/04/2016 0946   BUN 12 10/15/2015 1043   BUN 7 09/04/2015 0544   CREATININE 1.0  01/04/2016 0946   CREATININE 0.70 10/15/2015 1043   CREATININE 0.75 06/22/2015 1614   CALCIUM 9.6 01/04/2016 0946   CALCIUM 10.0 10/15/2015 1043   PROT 7.6 01/04/2016 0946   PROT 7.8 09/15/2015 1441   PROT 5.7* 08/31/2015 0620   ALBUMIN 3.1* 01/04/2016 0946   ALBUMIN 3.6 09/15/2015 1441   ALBUMIN 2.2* 08/31/2015 0620   AST 17 01/04/2016 0946   AST 23 09/15/2015 1441   ALT 21 01/04/2016 0946   ALT 14 09/15/2015 1441   ALKPHOS 105 01/04/2016 0946   ALKPHOS 81 09/15/2015 1441   BILITOT 0.38 01/04/2016 0946   BILITOT 0.4 09/15/2015 1441   BILITOT 0.7 08/31/2015 0620   GFRNONAA 87 10/15/2015 1043   GFRNONAA 80 06/22/2015 1614   GFRAA 101 10/15/2015 1043   GFRAA >89 06/22/2015 1614    I No results found for: SPEP  Lab Results  Component Value Date   WBC 7.4 10/15/2015   NEUTROABS 4.5 10/15/2015   HGB 8.6* 09/04/2015   HCT 34.2 10/15/2015   MCV 78* 10/15/2015   PLT 291 10/15/2015      Chemistry      Component Value Date/Time   NA 135* 01/04/2016 0946   NA 137 10/15/2015 1043   NA 133* 09/04/2015 0544   K 4.9 01/04/2016 0946   K 4.9 10/15/2015 1043   CL 101 10/15/2015 1043   CO2 24 01/04/2016 0946   CO2 22 10/15/2015 1043   BUN 23.8 01/04/2016 0946   BUN 12 10/15/2015 1043   BUN 7 09/04/2015 0544   CREATININE 1.0 01/04/2016 0946   CREATININE 0.70 10/15/2015 1043   CREATININE 0.75 06/22/2015 1614      Component Value Date/Time   CALCIUM 9.6 01/04/2016 0946   CALCIUM 10.0 10/15/2015 1043   ALKPHOS 105 01/04/2016 0946   ALKPHOS 81 09/15/2015 1441   AST 17 01/04/2016 0946   AST 23 09/15/2015 1441   ALT 21 01/04/2016 0946   ALT 14 09/15/2015 1441   BILITOT 0.38 01/04/2016 0946   BILITOT 0.4 09/15/2015 1441   BILITOT 0.7 08/31/2015 0620       No results found for: LABCA2  No components found for: [id number]  No results for input(s):  INR in the last 168 hours.  Urinalysis    Component Value Date/Time   COLORURINE YELLOW 09/01/2015 1800   APPEARANCEUR HAZY* 09/01/2015 1800   LABSPEC 1.025 09/01/2015 1800   PHURINE 6.0 09/01/2015 1800   GLUCOSEU NEGATIVE 09/01/2015 1800   GLUCOSEU NEGATIVE 04/05/2008 0833   HGBUR SMALL* 09/01/2015 1800   BILIRUBINUR neg 10/25/2015 0926   BILIRUBINUR NEGATIVE 09/01/2015 1800   KETONESUR NEGATIVE 09/01/2015 1800   PROTEINUR neg 10/25/2015 0926   PROTEINUR TRACE* 09/01/2015 1800   UROBILINOGEN negative 10/25/2015 0926   UROBILINOGEN 0.2 09/01/2015 1800   NITRITE neg 10/25/2015 0926   NITRITE NEGATIVE 09/01/2015 1800   LEUKOCYTESUR Negative 10/25/2015 0926    STUDIES: Bone density at [facility] 04/11/2015 showed a T score of -3.5 at the right femoral neck  Mammogram at [facility] pending May 2017  ASSESSMENT: 70 y.o. [place] woman status post right breast biopsy  01/28/2014 for a clinical T1b N0, stage IA invasive ductal carcinoma, grade 1, estrogen and progesterone receptor positive, HER-2 not amplified, with an MIB-1 of 26%  (1) Biopsy of 2 additional suspicious areas in the right breast 02/03/2014 and 02/10/2014 showed only atypical ductal hyperplasia  (2) status post right lumpectomy and sentinel lymph node sampling 03/01/2014 showed an additional 3 mm area  of invasive ductal carcinoma, grade 1, with negative margins. The single sentinel lymph node was negative.  (3) Opted against adjuvant radiation given the marginal benefit of local recurrence  (4) started anastrozole 03/29/2014, interrupted May 2016 with joint swelling; restarted 06/20/15  (a) osteoporosis with T score -3.5 on bone density scan 04/11/2015  PLAN: Melody Casey is doing well with the anastrozole and the plan is to continue that for a total of 5 years, which will take [place] to April 2019.  She will see [place] again in August. At that point we will start seeing her on a yearly basis until she completes her 5 years of  treatment.  It will be difficult for her to maintain her bone density and even more so improvement on his she goes on pharmacologic treatment. At the next visit we will discuss Denosumab versus zolendronate.  She knows to call for any problems that may develop before her next visit here.  Chauncey Cruel, MD   01/04/2016 10:38 AM

## 2016-01-11 DIAGNOSIS — K5732 Diverticulitis of large intestine without perforation or abscess without bleeding: Secondary | ICD-10-CM | POA: Diagnosis not present

## 2016-01-12 DIAGNOSIS — E119 Type 2 diabetes mellitus without complications: Secondary | ICD-10-CM | POA: Diagnosis not present

## 2016-01-12 DIAGNOSIS — H2513 Age-related nuclear cataract, bilateral: Secondary | ICD-10-CM | POA: Diagnosis not present

## 2016-01-12 DIAGNOSIS — D3131 Benign neoplasm of right choroid: Secondary | ICD-10-CM | POA: Diagnosis not present

## 2016-01-12 LAB — HM DIABETES EYE EXAM

## 2016-01-16 ENCOUNTER — Encounter: Payer: Self-pay | Admitting: Family Medicine

## 2016-01-16 ENCOUNTER — Ambulatory Visit (INDEPENDENT_AMBULATORY_CARE_PROVIDER_SITE_OTHER): Payer: Medicare Other | Admitting: Family Medicine

## 2016-01-16 DIAGNOSIS — Z79899 Other long term (current) drug therapy: Secondary | ICD-10-CM | POA: Diagnosis not present

## 2016-01-16 DIAGNOSIS — I4819 Other persistent atrial fibrillation: Secondary | ICD-10-CM

## 2016-01-16 DIAGNOSIS — H6123 Impacted cerumen, bilateral: Secondary | ICD-10-CM | POA: Diagnosis not present

## 2016-01-16 DIAGNOSIS — I481 Persistent atrial fibrillation: Secondary | ICD-10-CM

## 2016-01-16 DIAGNOSIS — H9113 Presbycusis, bilateral: Secondary | ICD-10-CM | POA: Diagnosis not present

## 2016-01-16 LAB — POCT INR: INR: 2.9

## 2016-01-16 NOTE — Progress Notes (Signed)
   HPI  Patient presents today to discuss Coumadin dosing.  She's been on Coumadin for several years for atrial fibrillation. She has not had any missed doses, she has not changed her diet. She has had a recent prednisone course. Last month her INR was 1.7 and she increased her Coumadin dose by 2.5 mg per week.  She would like to go back to her own dosing considering the large increase in her INR in the last month. No signs of bleeding or concerns of bleeding.   PMH: Smoking status noted ROS: Per HPI  Objective: BP 141/75 mmHg  Pulse 61  Temp(Src) 97.1 F (36.2 C) (Oral)  Ht '5\' 7"'$  (1.702 m)  Wt 174 lb 6.4 oz (79.107 kg)  BMI 27.31 kg/m2 Gen: NAD, alert, cooperative with exam HEENT: NCAT Neuro: Alert and oriented, No gross deficits  Assessment and plan:  #atrial fibrillation, chronic anticoagulation INR 2.9 today Decrease Coumadin dose to her old dose, 5 mg daily, 35 mg per week. Follow-up one month for INR  A. fib rate controlled   Laroy Apple, MD [facility] 01/16/2016, 8:44 AM

## 2016-01-16 NOTE — Patient Instructions (Signed)
Great to see you!  Based on our discussion, you can go back to 5 mg daily.  Come back in 1 month for an INR check.

## 2016-01-17 DIAGNOSIS — M25431 Effusion, right wrist: Secondary | ICD-10-CM | POA: Diagnosis not present

## 2016-01-17 DIAGNOSIS — M25442 Effusion, left hand: Secondary | ICD-10-CM | POA: Diagnosis not present

## 2016-01-17 DIAGNOSIS — M25432 Effusion, left wrist: Secondary | ICD-10-CM | POA: Diagnosis not present

## 2016-01-17 DIAGNOSIS — Z79899 Other long term (current) drug therapy: Secondary | ICD-10-CM | POA: Diagnosis not present

## 2016-01-17 DIAGNOSIS — M25441 Effusion, right hand: Secondary | ICD-10-CM | POA: Diagnosis not present

## 2016-01-17 DIAGNOSIS — R799 Abnormal finding of blood chemistry, unspecified: Secondary | ICD-10-CM | POA: Diagnosis not present

## 2016-01-23 ENCOUNTER — Encounter: Payer: Self-pay | Admitting: Gastroenterology

## 2016-01-30 ENCOUNTER — Other Ambulatory Visit: Payer: Self-pay | Admitting: *Deleted

## 2016-01-30 DIAGNOSIS — H903 Sensorineural hearing loss, bilateral: Secondary | ICD-10-CM | POA: Diagnosis not present

## 2016-02-03 ENCOUNTER — Telehealth: Payer: Self-pay | Admitting: Family Medicine

## 2016-02-03 MED ORDER — WARFARIN SODIUM 5 MG PO TABS: 5.0000 mg | ORAL_TABLET | Freq: Every day | ORAL | Status: DC

## 2016-02-03 NOTE — Telephone Encounter (Signed)
Stp and advised rx was sent to pharmacy today, pt voiced understanding.

## 2016-02-03 NOTE — Telephone Encounter (Signed)
Agree with Rx  Laroy Apple, MD [facility] 02/03/2016, 11:30 AM

## 2016-02-03 NOTE — Telephone Encounter (Signed)
Not on med list

## 2016-02-06 ENCOUNTER — Encounter: Payer: Self-pay | Admitting: Pharmacist

## 2016-02-12 ENCOUNTER — Other Ambulatory Visit: Payer: Self-pay | Admitting: Cardiology

## 2016-02-13 ENCOUNTER — Ambulatory Visit (INDEPENDENT_AMBULATORY_CARE_PROVIDER_SITE_OTHER): Payer: Medicare Other | Admitting: Pharmacist

## 2016-02-13 DIAGNOSIS — Z79899 Other long term (current) drug therapy: Secondary | ICD-10-CM | POA: Diagnosis not present

## 2016-02-13 DIAGNOSIS — I714 Abdominal aortic aneurysm, without rupture, unspecified: Secondary | ICD-10-CM

## 2016-02-13 DIAGNOSIS — I481 Persistent atrial fibrillation: Secondary | ICD-10-CM | POA: Diagnosis not present

## 2016-02-13 DIAGNOSIS — I4819 Other persistent atrial fibrillation: Secondary | ICD-10-CM

## 2016-02-13 LAB — COAGUCHEK XS/INR WAIVED
INR: 1.9 — AB (ref 0.9–1.1)
PROTHROMBIN TIME: 23 s

## 2016-02-13 MED ORDER — LISINOPRIL 40 MG PO TABS: 40.0000 mg | ORAL_TABLET | ORAL | Status: DC

## 2016-02-13 MED ORDER — METFORMIN HCL 500 MG PO TABS: 500.0000 mg | ORAL_TABLET | Freq: Every day | ORAL | Status: DC

## 2016-02-13 NOTE — Patient Instructions (Signed)
Anticoagulation Dose Instructions as of 02/13/2016      Melody Casey Tue Wed Thu Fri Sat   New Dose 5 mg 5 mg 5 mg 5 mg 5 mg 5 mg 5 mg    Description        Continue to take '5mg'$  (1 tablet) of warfarin daily.     INR was 1.9 today

## 2016-02-13 NOTE — Telephone Encounter (Signed)
Rx(s) sent to pharmacy electronically.  

## 2016-02-26 ENCOUNTER — Other Ambulatory Visit: Payer: Self-pay | Admitting: Internal Medicine

## 2016-03-07 DIAGNOSIS — M25562 Pain in left knee: Secondary | ICD-10-CM | POA: Diagnosis not present

## 2016-03-07 DIAGNOSIS — Z79899 Other long term (current) drug therapy: Secondary | ICD-10-CM | POA: Diagnosis not present

## 2016-03-07 DIAGNOSIS — M25442 Effusion, left hand: Secondary | ICD-10-CM | POA: Diagnosis not present

## 2016-03-07 DIAGNOSIS — R799 Abnormal finding of blood chemistry, unspecified: Secondary | ICD-10-CM | POA: Diagnosis not present

## 2016-03-07 DIAGNOSIS — M130 Polyarthritis, unspecified: Secondary | ICD-10-CM | POA: Diagnosis not present

## 2016-03-07 DIAGNOSIS — M25441 Effusion, right hand: Secondary | ICD-10-CM | POA: Diagnosis not present

## 2016-03-21 ENCOUNTER — Ambulatory Visit (INDEPENDENT_AMBULATORY_CARE_PROVIDER_SITE_OTHER): Payer: Medicare Other | Admitting: Nurse Practitioner

## 2016-03-21 ENCOUNTER — Encounter: Payer: Self-pay | Admitting: Nurse Practitioner

## 2016-03-21 VITALS — BP 124/76 | HR 66 | Temp 97.6°F | Ht 67.0 in | Wt 179.2 lb

## 2016-03-21 DIAGNOSIS — N3000 Acute cystitis without hematuria: Secondary | ICD-10-CM

## 2016-03-21 DIAGNOSIS — R309 Painful micturition, unspecified: Secondary | ICD-10-CM

## 2016-03-21 LAB — MICROSCOPIC EXAMINATION: WBC, UA: >30 /hpf — AB (ref 0–?)

## 2016-03-21 LAB — URINALYSIS, COMPLETE
Bilirubin, UA: NEGATIVE
GLUCOSE, UA: NEGATIVE
KETONES UA: NEGATIVE
NITRITE UA: NEGATIVE
PH UA: 7 (ref 5.0–7.5)
PROTEIN UA: NEGATIVE
RBC UA: NEGATIVE
Specific Gravity, UA: 1.015 (ref 1.005–1.030)
Urobilinogen, Ur: 0.2 mg/dL (ref 0.2–1.0)

## 2016-03-21 MED ORDER — CIPROFLOXACIN HCL 500 MG PO TABS: 500.0000 mg | ORAL_TABLET | Freq: Two times a day (BID) | ORAL | Status: DC

## 2016-03-21 NOTE — Progress Notes (Signed)
  Subjective:    Melody Casey is a 70 y.o. female who complains of dysuria, hematuria and urgency. She has had symptoms for 5 days. Patient also complains of nothing else. Patient denies back pain, fever, stomach ache and vaginal discharge. Patient does not have a history of recurrent UTI. Patient does not have a history of pyelonephritis.   The following portions of the patient's history were reviewed and updated as appropriate: allergies, current medications, past family history, past medical history, past social history, past surgical history and problem list.  Review of Systems Pertinent items are noted in HPI.    Objective:    BP 124/76 mmHg  Pulse 66  Temp(Src) 97.6 F (36.4 C) (Oral)  Ht '5\' 7"'$  (1.702 m)  Wt 179 lb 3.2 oz (81.285 kg)  BMI 28.06 kg/m2 General appearance: alert and cooperative Lungs: clear to auscultation bilaterally Heart: regular rate and rhythm, S1, S2 normal, no murmur, click, rub or gallop Abdomen: soft, non-tender; bowel sounds normal; no masses,  no organomegaly. No CVA tenderness  Laboratory:  Urine dipstick: 3+ for leukocyte esterase.   Micro exam: results not available.    Assessment:    Acute cystitis     Plan:  Take medication as prescribe Cotton underwear Take shower not bath Cranberry juice, yogurt Force fluids AZO over the counter X2 days Culture pending RTO prn  Take cipro '500mg'$  1 po BID X 3 days Mary-Margaret Hassell Done, FNP

## 2016-03-21 NOTE — Patient Instructions (Signed)

## 2016-03-23 LAB — URINE CULTURE

## 2016-03-26 ENCOUNTER — Ambulatory Visit (INDEPENDENT_AMBULATORY_CARE_PROVIDER_SITE_OTHER): Payer: Medicare Other | Admitting: Family Medicine

## 2016-03-26 ENCOUNTER — Encounter: Payer: Self-pay | Admitting: Family Medicine

## 2016-03-26 VITALS — BP 101/60 | HR 73 | Temp 97.2°F | Ht 67.0 in | Wt 176.2 lb

## 2016-03-26 DIAGNOSIS — E785 Hyperlipidemia, unspecified: Secondary | ICD-10-CM

## 2016-03-26 DIAGNOSIS — E119 Type 2 diabetes mellitus without complications: Secondary | ICD-10-CM

## 2016-03-26 DIAGNOSIS — Z79899 Other long term (current) drug therapy: Secondary | ICD-10-CM | POA: Diagnosis not present

## 2016-03-26 DIAGNOSIS — H5789 Other specified disorders of eye and adnexa: Secondary | ICD-10-CM

## 2016-03-26 DIAGNOSIS — I714 Abdominal aortic aneurysm, without rupture: Secondary | ICD-10-CM | POA: Diagnosis not present

## 2016-03-26 DIAGNOSIS — I4819 Other persistent atrial fibrillation: Secondary | ICD-10-CM

## 2016-03-26 DIAGNOSIS — H578 Other specified disorders of eye and adnexa: Secondary | ICD-10-CM | POA: Diagnosis not present

## 2016-03-26 DIAGNOSIS — I481 Persistent atrial fibrillation: Secondary | ICD-10-CM | POA: Diagnosis not present

## 2016-03-26 LAB — BAYER DCA HB A1C WAIVED: HB A1C (BAYER DCA - WAIVED): 7.3 % — ABNORMAL HIGH (ref <7.0)

## 2016-03-26 LAB — COAGUCHEK XS/INR WAIVED
INR: 2.5 — AB (ref 0.9–1.1)
PROTHROMBIN TIME: 29.9 s

## 2016-03-26 NOTE — Patient Instructions (Signed)
Great to see you!  Your A1C slipped some, for now we can just focus on diet and exercise. Steroids will make this more difficult to control.   Lets plan to look again in 3 months.

## 2016-03-26 NOTE — Progress Notes (Signed)
   HPI  Patient presents today here for follow-up diabetes, A. fib, eye swelling.  Right eye swelling for about 2 weeks, she's describes it as fullness under her right eye, no irritation, no vision changes, no redness.  Diabetes Good metformin compliance Average fasting blood sugar is 100-120 Aware of her diet, not really watching it Active but no formal exercise.  A. fib Good Coumadin compliance  Hyperlipidemia Good medication compliance, fasting today  PMH: Smoking status noted ROS: Per HPI  Objective: BP 101/60 mmHg  Pulse 73  Temp(Src) 97.2 F (36.2 C) (Oral)  Ht '5\' 7"'$  (1.702 m)  Wt 176 lb 3.2 oz (79.924 kg)  BMI 27.59 kg/m2 Gen: NAD, alert, cooperative with exam HEENT: NCAT, slight fullness under the eyes bilaterally, no conjunctival injection, no turbinate swelling-nares clear CV: RRR, good S1/S2, no murmur Resp: CTABL, no wheezes, non-labored Ext: No edema, warm Neuro: Alert and oriented, No gross deficits  Assessment and plan:  # Type 2 diabetes Control slipping, A1c change from 6.1-7.3. Likely some steroid effect, also some diet indiscretion, discussed diet and exercise No new medications for now Try to minimize steroid burst Return to clinic in 3 months  # Hyperlipidemia Labs today Continue Lipitor  # Eye swelling Unclear etiology, likely allergy related however turbinates are not swollen Not bothersome, watchful waiting  A fib Rate controlled on tokosyn Coumadin  Coumadin anticoag for A fib INR thgeraputic    Orders Placed This Encounter  Procedures  . Bayer DCA Hb A1c Waived  . Lipid Panel  . CMP14+EGFR  . CBC with Differential    No orders of the defined types were placed in this encounter.    Laroy Apple, MD [facility] 03/26/2016, 9:58 AM

## 2016-03-27 ENCOUNTER — Other Ambulatory Visit: Payer: Self-pay | Admitting: Family Medicine

## 2016-03-27 LAB — LIPID PANEL
CHOLESTEROL TOTAL: 135 mg/dL (ref 100–199)
Chol/HDL Ratio: 3.4 ratio units (ref 0.0–4.4)
HDL: 40 mg/dL (ref >39)
LDL CALC: 75 mg/dL (ref 0–99)
TRIGLYCERIDES: 101 mg/dL (ref 0–149)
VLDL Cholesterol Cal: 20 mg/dL (ref 5–40)

## 2016-03-27 LAB — CMP14+EGFR
A/G RATIO: 1 — AB (ref 1.2–2.2)
ALT: 12 IU/L (ref 0–32)
AST: 19 IU/L (ref 0–40)
Albumin: 3.6 g/dL (ref 3.5–4.8)
Alkaline Phosphatase: 100 IU/L (ref 39–117)
BUN / CREAT RATIO: 14 (ref 12–28)
BUN: 13 mg/dL (ref 8–27)
Bilirubin Total: 0.4 mg/dL (ref 0.0–1.2)
CO2: 19 mmol/L (ref 18–29)
Calcium: 9.6 mg/dL (ref 8.7–10.3)
Chloride: 100 mmol/L (ref 96–106)
Creatinine, Ser: 0.92 mg/dL (ref 0.57–1.00)
GFR, EST AFRICAN AMERICAN: 72 mL/min/{1.73_m2} (ref >59)
GFR, EST NON AFRICAN AMERICAN: 62 mL/min/{1.73_m2} (ref >59)
GLOBULIN, TOTAL: 3.6 g/dL (ref 1.5–4.5)
Glucose: 120 mg/dL — ABNORMAL HIGH (ref 65–99)
POTASSIUM: 4.4 mmol/L (ref 3.5–5.2)
SODIUM: 137 mmol/L (ref 134–144)
TOTAL PROTEIN: 7.2 g/dL (ref 6.0–8.5)

## 2016-03-27 LAB — CBC WITH DIFFERENTIAL/PLATELET
BASOS: 0 %
Basophils Absolute: 0 10*3/uL (ref 0.0–0.2)
EOS (ABSOLUTE): 0.1 10*3/uL (ref 0.0–0.4)
Eos: 1 %
Hematocrit: 33.3 % — ABNORMAL LOW (ref 34.0–46.6)
Hemoglobin: 10.4 g/dL — ABNORMAL LOW (ref 11.1–15.9)
IMMATURE GRANS (ABS): 0 10*3/uL (ref 0.0–0.1)
Immature Granulocytes: 0 %
Lymphocytes Absolute: 1.4 10*3/uL (ref 0.7–3.1)
Lymphs: 27 %
MCH: 23.2 pg — AB (ref 26.6–33.0)
MCHC: 31.2 g/dL — ABNORMAL LOW (ref 31.5–35.7)
MCV: 74 fL — AB (ref 79–97)
MONOS ABS: 0.5 10*3/uL (ref 0.1–0.9)
Monocytes: 10 %
NEUTROS ABS: 3.2 10*3/uL (ref 1.4–7.0)
Neutrophils: 62 %
PLATELETS: 329 10*3/uL (ref 150–379)
RBC: 4.49 x10E6/uL (ref 3.77–5.28)
RDW: 19.6 % — AB (ref 12.3–15.4)
WBC: 5.2 10*3/uL (ref 3.4–10.8)

## 2016-03-27 MED ORDER — FERROUS SULFATE 324 (65 FE) MG PO TBEC: DELAYED_RELEASE_TABLET | ORAL | Status: DC

## 2016-03-27 NOTE — Telephone Encounter (Signed)
Rx for iron sent.   Laroy Apple, MD [facility] 03/27/2016, 11:05 AM

## 2016-04-02 ENCOUNTER — Other Ambulatory Visit: Payer: Self-pay | Admitting: Family Medicine

## 2016-04-03 MED ORDER — PREDNISONE 50 MG PO TABS: 50.0000 mg | ORAL_TABLET | Freq: Every day | ORAL | Status: DC

## 2016-04-03 NOTE — Telephone Encounter (Signed)
RX sent, discussed last visit.   Laroy Apple, MD [facility] 04/03/2016, 7:32 AM

## 2016-04-03 NOTE — Telephone Encounter (Signed)
Patient aware.

## 2016-04-04 ENCOUNTER — Encounter: Payer: Self-pay | Admitting: *Deleted

## 2016-04-06 DIAGNOSIS — L84 Corns and callosities: Secondary | ICD-10-CM | POA: Diagnosis not present

## 2016-04-06 DIAGNOSIS — E1151 Type 2 diabetes mellitus with diabetic peripheral angiopathy without gangrene: Secondary | ICD-10-CM | POA: Diagnosis not present

## 2016-04-06 DIAGNOSIS — L603 Nail dystrophy: Secondary | ICD-10-CM | POA: Diagnosis not present

## 2016-04-06 DIAGNOSIS — I739 Peripheral vascular disease, unspecified: Secondary | ICD-10-CM | POA: Diagnosis not present

## 2016-04-09 ENCOUNTER — Emergency Department (HOSPITAL_COMMUNITY)
Admission: EM | Admit: 2016-04-09 | Discharge: 2016-04-10 | Disposition: A | Discharge status: Home / Self Care | Payer: Medicare Other | Attending: Emergency Medicine | Admitting: Emergency Medicine

## 2016-04-09 ENCOUNTER — Ambulatory Visit (INDEPENDENT_AMBULATORY_CARE_PROVIDER_SITE_OTHER): Payer: Medicare Other | Admitting: Family

## 2016-04-09 ENCOUNTER — Encounter (HOSPITAL_COMMUNITY): Payer: Self-pay | Admitting: Emergency Medicine

## 2016-04-09 VITALS — BP 113/67 | HR 87 | Temp 98.2°F | Ht 67.0 in | Wt 173.0 lb

## 2016-04-09 DIAGNOSIS — E119 Type 2 diabetes mellitus without complications: Secondary | ICD-10-CM | POA: Diagnosis not present

## 2016-04-09 DIAGNOSIS — Z7984 Long term (current) use of oral hypoglycemic drugs: Secondary | ICD-10-CM | POA: Insufficient documentation

## 2016-04-09 DIAGNOSIS — Z7901 Long term (current) use of anticoagulants: Secondary | ICD-10-CM | POA: Insufficient documentation

## 2016-04-09 DIAGNOSIS — N135 Crossing vessel and stricture of ureter without hydronephrosis: Secondary | ICD-10-CM | POA: Diagnosis not present

## 2016-04-09 DIAGNOSIS — M1990 Unspecified osteoarthritis, unspecified site: Secondary | ICD-10-CM | POA: Diagnosis not present

## 2016-04-09 DIAGNOSIS — I4891 Unspecified atrial fibrillation: Secondary | ICD-10-CM | POA: Insufficient documentation

## 2016-04-09 DIAGNOSIS — Z79899 Other long term (current) drug therapy: Secondary | ICD-10-CM

## 2016-04-09 DIAGNOSIS — Z853 Personal history of malignant neoplasm of breast: Secondary | ICD-10-CM | POA: Diagnosis not present

## 2016-04-09 DIAGNOSIS — I2510 Atherosclerotic heart disease of native coronary artery without angina pectoris: Secondary | ICD-10-CM | POA: Diagnosis not present

## 2016-04-09 DIAGNOSIS — R197 Diarrhea, unspecified: Secondary | ICD-10-CM

## 2016-04-09 DIAGNOSIS — R1110 Vomiting, unspecified: Secondary | ICD-10-CM

## 2016-04-09 DIAGNOSIS — E860 Dehydration: Secondary | ICD-10-CM

## 2016-04-09 DIAGNOSIS — E785 Hyperlipidemia, unspecified: Secondary | ICD-10-CM | POA: Diagnosis not present

## 2016-04-09 DIAGNOSIS — Q6239 Other obstructive defects of renal pelvis and ureter: Secondary | ICD-10-CM | POA: Insufficient documentation

## 2016-04-09 DIAGNOSIS — R109 Unspecified abdominal pain: Secondary | ICD-10-CM | POA: Diagnosis present

## 2016-04-09 DIAGNOSIS — R195 Other fecal abnormalities: Secondary | ICD-10-CM | POA: Diagnosis not present

## 2016-04-09 DIAGNOSIS — Z87891 Personal history of nicotine dependence: Secondary | ICD-10-CM | POA: Insufficient documentation

## 2016-04-09 DIAGNOSIS — I10 Essential (primary) hypertension: Secondary | ICD-10-CM | POA: Insufficient documentation

## 2016-04-09 DIAGNOSIS — K921 Melena: Secondary | ICD-10-CM | POA: Diagnosis not present

## 2016-04-09 DIAGNOSIS — N1330 Unspecified hydronephrosis: Secondary | ICD-10-CM | POA: Diagnosis not present

## 2016-04-09 LAB — BASIC METABOLIC PANEL
ANION GAP: 6 (ref 5–15)
BUN: 27 mg/dL — AB (ref 6–20)
CHLORIDE: 104 mmol/L (ref 101–111)
CO2: 22 mmol/L (ref 22–32)
Calcium: 9.8 mg/dL (ref 8.9–10.3)
Creatinine, Ser: 1.09 mg/dL — ABNORMAL HIGH (ref 0.44–1.00)
GFR calc Af Amer: 57 mL/min — ABNORMAL LOW (ref >60)
GFR, EST NON AFRICAN AMERICAN: 49 mL/min — AB (ref >60)
GLUCOSE: 112 mg/dL — AB (ref 65–99)
POTASSIUM: 4 mmol/L (ref 3.5–5.1)
Sodium: 132 mmol/L — ABNORMAL LOW (ref 135–145)

## 2016-04-09 LAB — CBC WITH DIFFERENTIAL/PLATELET
Basophils Absolute: 0 10*3/uL (ref 0.0–0.1)
Basophils Relative: 0 %
Eosinophils Absolute: 0.1 10*3/uL (ref 0.0–0.7)
Eosinophils Relative: 1 %
HEMATOCRIT: 32 % — AB (ref 36.0–46.0)
HEMOGLOBIN: 10.1 g/dL — AB (ref 12.0–15.0)
LYMPHS ABS: 1.5 10*3/uL (ref 0.7–4.0)
LYMPHS PCT: 22 %
MCH: 22.9 pg — AB (ref 26.0–34.0)
MCHC: 31.6 g/dL (ref 30.0–36.0)
MCV: 72.6 fL — AB (ref 78.0–100.0)
MONOS PCT: 9 %
Monocytes Absolute: 0.6 10*3/uL (ref 0.1–1.0)
NEUTROS ABS: 4.5 10*3/uL (ref 1.7–7.7)
NEUTROS PCT: 68 %
Platelets: 272 10*3/uL (ref 150–400)
RBC: 4.41 MIL/uL (ref 3.87–5.11)
RDW: 19.6 % — ABNORMAL HIGH (ref 11.5–15.5)
WBC: 6.6 10*3/uL (ref 4.0–10.5)

## 2016-04-09 LAB — APTT: APTT: 30 s (ref 24–37)

## 2016-04-09 LAB — PROTIME-INR
INR: 1.97 — ABNORMAL HIGH (ref 0.00–1.49)
Prothrombin Time: 22.3 seconds — ABNORMAL HIGH (ref 11.6–15.2)

## 2016-04-09 LAB — FINGERSTICK HEMOGLOBIN: Hemoglobin: 9.8 g/dL — ABNORMAL LOW (ref 11.1–15.9)

## 2016-04-09 NOTE — Patient Instructions (Signed)

## 2016-04-09 NOTE — ED Notes (Signed)
Patient states she had black colored diarrhea starting yesterday. States when she went to her doctor today, she was told to come here due to hemoglobin of 9.8.

## 2016-04-09 NOTE — ED Provider Notes (Signed)
CSN: [id number]     Arrival date & time 04/09/16  1950 History  By signing my name below, I, Doran Stabler, attest that this documentation has been prepared under the direction and in the presence of Rolland Porter, MDat 0002 AM Electronically Signed: Doran Stabler, ED Scribe. 04/10/2016. 12:14 AM.   Chief Complaint  Patient presents with  . Abnormal Lab   The history is provided by the patient. No language interpreter was used.   HPI Comments: Melody Casey is a 70 y.o. female who presents to the Emergency Department with a PMHx of HTN, DM, AAA, urethral obstruction, and diverticulitis complaining of feeling lousy meaning weakness that began 2 days ago. Pt also reports 2 days of intermittent abdominal cramps, 1 day of diarrhea x 12, and decreased appetite. Pt states her watery diarrhea is olive green and black in color. Her last episode of diarrhea was 6 hours ago. Pt was seen at Dr. Alen Bleacher office 6 hours ago. There, they found a hemoglobin of 9.8. Dr. Wendi Snipes referred to the pt to the ED for further evaluation. Pt denies taking Pepto-Bismol, MOM or antibiotics recently. Pt took iron supplement twice last week, the last time was the day she started feeling bad.  Pt does not have any abd pain while in the ED tonight. Pt denies any fevers,  vomiting, dizziness, lightheadedness, or any other symptoms at this time. She does have nausea. She denies any anything she thinks could've made her ill or being around anybody else who is ill.   Pt does not smoke or drink alcohol. Pt is on coumadin.  Patient was seen by her PCP about a week ago and was told to take iron for anemia.  Patient states she's had diverticulitis in the past but this feels different. She states she's had endoscopy before and she is on Protonix twice a day.  PCP Dr. Wendi Snipes  Past Medical History  Diagnosis Date  . HLD (hyperlipidemia)   . HTN (hypertension)   . Arthritis   . Foot deformity     right  . CAD (coronary artery  disease)     a. NSTEMI 02/2011: occ mid Cx, DES to OM2, residual nonobst LAD dz.  . Tobacco abuse     stopped smoking 2012  . Diabetes mellitus   . Atrial fibrillation ([place])     a. Dx 03/2011 - on tikosyn/coumadin.  . Anxiety   . Diverticulitis of colon      2008, 04/2011, 12/2014, 08/2015  . Ureteral obstruction     History of gross hematuria/right hydronephrosis 2/2 to uteropelvic junction obstruction, s/p cystoscopy in January 2007 with bilateral retrograde pyelography, right ureter arthroscopy, right ureteral stent placement, bladder biopsies, stent removal since then.  . Wears dentures     top  . Cancer (HCC)     breast  . Diverticulitis   . Hemangioma     liver  . AAA (abdominal aortic aneurysm) ([place])   . Osteoporosis    Past Surgical History  Procedure Laterality Date  . Tonsillectomy and adenoidectomy    . Knee arthroscopy      left  . Tubal ligation    . Right leg surgery  age 32    As a child for  ?scleroderma per patient  . Ureteral surgery  2011    rt ureterostomy-stent  . Coronary stent placement  2012  . Cardiac catheterization  2012    stent  . Breast lumpectomy with needle localization and axillary sentinel lymph node  bx Right 03/01/2014    Procedure: BREAST LUMPECTOMY WITH NEEDLE LOCALIZATION AND AXILLARY SENTINEL LYMPH NODE BX;  Surgeon: Edward Jolly, MD;  Location: [facility];  Service: General;  Laterality: Right;  . Colonoscopy with propofol N/A 06/29/2014    Dr. Wynetta Emery: universal diverticulosis  . Esophagogastroduodenoscopy (egd) with propofol N/A 11/01/2015    Procedure: ESOPHAGOGASTRODUODENOSCOPY (EGD) WITH PROPOFOL;  Surgeon: Danie Binder, MD;  Location: [facility];  Service: Endoscopy;  Laterality: N/A;  . Biopsy N/A 11/01/2015    Procedure: BIOPSY;  Surgeon: Danie Binder, MD;  Location: [facility];  Service: Endoscopy;  Laterality: N/A;   Family History  Problem Relation Age of Onset  . Colon cancer Neg Hx   . Liver disease Neg Hx    . Lung cancer Father 2    deceased  . Cancer Father   . Heart failure Mother 31    deceased  . Heart disease Mother   . Diabetes Brother    Social History  Substance Use Topics  . Smoking status: Former Smoker -- 2.00 packs/day for 50 years    Types: Cigarettes    Quit date: 02/08/2011  . Smokeless tobacco: Never Used  . Alcohol Use: No   Lives at home  Lives with spouse  OB History    No data available     Review of Systems  Constitutional: Positive for appetite change (decreased). Negative for fever and chills.  Gastrointestinal: Positive for diarrhea. Negative for nausea and vomiting.  Neurological: Positive for weakness. Negative for dizziness and light-headedness.  All other systems reviewed and are negative.   Allergies  Miralax; Vancomycin; Sulfa antibiotics; Sulfacetamide sodium; Acetaminophen; Banana; Oxycodone-acetaminophen; Penicillins; Latex; and Tape  Home Medications   Prior to Admission medications   Medication Sig Start Date End Date Taking? Authorizing Provider  ALPRAZolam Duanne Moron) 0.25 MG tablet Take 0.25 mg by mouth daily as needed for anxiety.    Yes Historical Provider, MD  amLODipine (NORVASC) 2.5 MG tablet TAKE ONE TABLET BY MOUTH IN THE MORNING 02/13/16  Yes Minus Breeding, MD  anastrozole (ARIMIDEX) 1 MG tablet Take 1 tablet (1 mg total) by mouth daily. 01/04/16  Yes Chauncey Cruel, MD  atorvastatin (LIPITOR) 40 MG tablet TAKE ONE AND ONE-HALF TABLETS BY MOUTH IN THE EVENING 02/28/16  Yes Minus Breeding, MD  calcium-vitamin D (OSCAL WITH D) 500-200 MG-UNIT per tablet Take 1 tablet by mouth 2 (two) times daily.   Yes Historical Provider, MD  dofetilide (TIKOSYN) 250 MCG capsule Take 1 capsule (250 mcg total) by mouth 2 (two) times daily. 07/28/14  Yes Minus Breeding, MD  ferrous sulfate 324 (65 Fe) MG TBEC 1 pill by mouth twice daily Patient taking differently: Take 325 mg by mouth 2 (two) times daily. 1 pill by mouth twice daily 03/27/16  Yes Timmothy Euler, MD  lisinopril (PRINIVIL,ZESTRIL) 40 MG tablet Take 1 tablet (40 mg total) by mouth every morning. 02/13/16  Yes Timmothy Euler, MD  magnesium oxide (MAG-OX) 400 MG tablet Take 400 mg twice a day with food Patient taking differently: Take 400 mg by mouth 2 (two) times daily with a meal.  08/17/14  Yes Minus Breeding, MD  metFORMIN (GLUCOPHAGE) 500 MG tablet Take 1 tablet (500 mg total) by mouth daily. 02/13/16  Yes Timmothy Euler, MD  niacin (NIASPAN) 500 MG CR tablet Take 1 tablet (500 mg total) by mouth at bedtime. 08/11/14  Yes Minus Breeding, MD  nitroGLYCERIN (NITROSTAT) 0.4 MG SL  tablet Place 1 tablet (0.4 mg total) under the tongue every 5 (five) minutes as needed for chest pain. 06/30/15  Yes Minus Breeding, MD  ondansetron (ZOFRAN) 4 MG tablet Take 1 tablet (4 mg total) by mouth every 6 (six) hours as needed for nausea. 09/15/15  Yes Timmothy Euler, MD  pantoprazole (PROTONIX) 40 MG tablet Take 1 tablet (40 mg total) by mouth 2 (two) times daily before a meal. 10/03/15  Yes Orvil Feil, NP  potassium chloride SA (K-DUR,KLOR-CON) 20 MEQ tablet Take 20 mEq by mouth 2 (two) times daily.     Yes Historical Provider, MD  warfarin (COUMADIN) 5 MG tablet Take 1-1.5 tablets (5-7.5 mg total) by mouth daily. Patient taking differently: Take 5 mg by mouth daily.  02/03/16  Yes Timmothy Euler, MD  ciprofloxacin (CIPRO) 500 MG tablet Take 1 tablet (500 mg total) by mouth 2 (two) times daily. Patient not taking: Reported on 04/09/2016 03/21/16   Mary-Margaret Hassell Done, FNP   BP 132/77 mmHg  Pulse 80  Temp(Src) 98.3 F (36.8 C) (Oral)  Resp 18  Ht '5\' 8"'$  (1.727 m)  Wt 173 lb (78.472 kg)  BMI 26.31 kg/m2  SpO2 96%  Vital signs normal     Physical Exam  Constitutional: She is oriented to person, place, and time. She appears well-developed and well-nourished.  Non-toxic appearance. She does not appear ill. No distress.  HENT:  Head: Normocephalic and atraumatic.  Right Ear: External ear  normal.  Left Ear: External ear normal.  Nose: Nose normal. No mucosal edema or rhinorrhea.  Mouth/Throat: Oropharynx is clear and moist. Mucous membranes are dry. No dental abscesses or uvula swelling.  Eyes: Conjunctivae and EOM are normal. Pupils are equal, round, and reactive to light.  Neck: Normal range of motion and full passive range of motion without pain. Neck supple.  Cardiovascular: Normal rate, regular rhythm and normal heart sounds.  Exam reveals no gallop and no friction rub.   No murmur heard. Pulmonary/Chest: Effort normal and breath sounds normal. No respiratory distress. She has no wheezes. She has no rhonchi. She has no rales. She exhibits no tenderness and no crepitus.  Abdominal: Soft. Normal appearance and bowel sounds are normal. She exhibits no distension. There is no rebound and no guarding.  Mild umbilical tenderness  Musculoskeletal: Normal range of motion. She exhibits no edema or tenderness.  Moves all extremities well.   Neurological: She is alert and oriented to person, place, and time. She has normal strength. No cranial nerve deficit.  Skin: Skin is warm, dry and intact. No rash noted. No erythema. No pallor.  Psychiatric: She has a normal mood and affect. Her speech is normal and behavior is normal. Her mood appears not anxious.  Nursing note and vitals reviewed.   ED Course  Procedures   Medications  diatrizoate meglumine-sodium (GASTROGRAFIN) 66-10 % solution (not administered)  sodium chloride 0.9 % bolus 1,000 mL (0 mLs Intravenous Stopped 04/10/16 0308)  sodium chloride 0.9 % bolus 500 mL (0 mLs Intravenous Stopped 04/10/16 0230)  ondansetron (ZOFRAN) injection 4 mg (4 mg Intravenous Given 04/10/16 0036)  iopamidol (ISOVUE-300) 61 % injection 100 mL (100 mLs Intravenous Contrast Given 04/10/16 0110)  sodium chloride 0.9 % bolus 1,000 mL (1,000 mLs Intravenous New Bag/Given 04/10/16 0311)   DIAGNOSTIC STUDIES: Oxygen Saturation is 98% on room air, normal by  my interpretation.    COORDINATION OF CARE: 12:03 AM Will order CTA abdomen/pelvis, blood work, and hemoccult. Discussed treatment plan  with pt at bedside and pt agreed to plan. Patient was given IV fluids for dehydration from her diarrhea.  Orthostatic vital signs were borderline in her pulse on standing. 00:29 Orthostatic Vital Signs Orthostatic Lying  - BP- Lying: 135/65 mmHg ; Pulse- Lying: 68  Orthostatic Sitting - BP- Sitting: 114/75 mmHg ; Pulse- Sitting: 61  Orthostatic Standing at 0 minutes - BP- Standing at 0 minutes: 132/77 mmHg ; Pulse- Standing at 0 minutes: 80 Angelena C Hunt, NT  00:30 Hourly Rounding Hourly Rounding - Intervention: Call light w/in reach      Nurse reports hemoccult was negative for blood  00:10 AM we discussed patient's test results. Her anemia is actually stable from several recent hemoglobins. A CT scan was done to look for colitis or some other GI source of her pain and diarrhea.   3:30 AM patient is feeling better. We discussed her CT results. She understands she needs follow up for urologist for this UPJ obstruction. However she also understood that was not the cause of her symptoms tonight. Although patient describes her stool as black she did take iron And her Hemoccult is negative. Her INR is subtherapeutic. She has a stable anemia. She will be given symptomatic treatment for her diarrhea and she should return if she gets worse.  Labs Review Results for orders placed or performed during the hospital encounter of 04/09/16  CBC with Differential  Result Value Ref Range   WBC 6.6 4.0 - 10.5 K/uL   RBC 4.41 3.87 - 5.11 MIL/uL   Hemoglobin 10.1 (L) 12.0 - 15.0 g/dL   HCT 32.0 (L) 36.0 - 46.0 %   MCV 72.6 (L) 78.0 - 100.0 fL   MCH 22.9 (L) 26.0 - 34.0 pg   MCHC 31.6 30.0 - 36.0 g/dL   RDW 19.6 (H) 11.5 - 15.5 %   Platelets 272 150 - 400 K/uL   Neutrophils Relative % 68 %   Neutro Abs 4.5 1.7 - 7.7 K/uL   Lymphocytes Relative 22 %   Lymphs Abs 1.5  0.7 - 4.0 K/uL   Monocytes Relative 9 %   Monocytes Absolute 0.6 0.1 - 1.0 K/uL   Eosinophils Relative 1 %   Eosinophils Absolute 0.1 0.0 - 0.7 K/uL   Basophils Relative 0 %   Basophils Absolute 0.0 0.0 - 0.1 K/uL  Basic metabolic panel  Result Value Ref Range   Sodium 132 (L) 135 - 145 mmol/L   Potassium 4.0 3.5 - 5.1 mmol/L   Chloride 104 101 - 111 mmol/L   CO2 22 22 - 32 mmol/L   Glucose, Bld 112 (H) 65 - 99 mg/dL   BUN 27 (H) 6 - 20 mg/dL   Creatinine, Ser 1.09 (H) 0.44 - 1.00 mg/dL   Calcium 9.8 8.9 - 10.3 mg/dL   GFR calc non Af Amer 49 (L) >60 mL/min   GFR calc Af Amer 57 (L) >60 mL/min   Anion gap 6 5 - 15  Protime-INR  Result Value Ref Range   Prothrombin Time 22.3 (H) 11.6 - 15.2 seconds   INR 1.97 (H) 0.00 - 1.49  APTT  Result Value Ref Range   aPTT 30 24 - 37 seconds  POC occult blood, ED RN will collect  Result Value Ref Range   Fecal Occult Bld NEGATIVE NEGATIVE    Laboratory interpretation all normal except mild elevation of the BUN consistent with dehydration, stable anemia    Imaging Review Ct Abdomen Pelvis W Contrast  04/10/2016  CLINICAL DATA:  Abdominal pain and diarrhea. Periumbilical pain. Weakness beginning 2 days ago. Diarrhea and decreased appetite. EXAM: CT ABDOMEN AND PELVIS WITH CONTRAST TECHNIQUE: Multidetector CT imaging of the abdomen and pelvis was performed using the standard protocol following bolus administration of intravenous contrast. CONTRAST:  172m ISOVUE-300 IOPAMIDOL (ISOVUE-300) INJECTION 61% COMPARISON:  08/30/2015 FINDINGS: Slight fibrosis in the lung bases.  Coronary artery calcifications. Focal low-attenuation lesion with peripheral enhancement in the right lobe of liver inferiorly, measuring 2.7 cm. Appearance is consistent with a hemangioma. The gallbladder, pancreas, spleen, adrenal glands, inferior vena cava, and retroperitoneal lymph nodes are unremarkable. Focal dilatation of the abdominal aorta at 3.3 cm diameter. Bilateral  iliac artery aneurysms at 1.7 cm on the right and 1.6 cm on the left. Calcifications in both kidneys likely representing vascular calcification. Stone in the lower pole of the right kidney. Measures 3 mm diameter. Right-sided hydronephrosis with decompressed ureter. No ureteral stones are demonstrated. Appearance suggests UPJ obstruction. Small cysts in both kidneys. No hydronephrosis on the left. The stomach, small bowel, and colon are not abnormally distended. Scattered diverticula in the colon. Scattered stool in the colon. No free air or free fluid in the abdomen. Pelvis: The appendix is normal. Bladder wall is not thickened. Multiple calcifications in the uterus consistent with fibroids. No abnormal adnexal masses. Diverticulosis of the sigmoid colon without evidence of diverticulitis. Muscular hypertrophy is present, decreased since previous study. No free or loculated pelvic fluid collections. No pelvic mass or lymphadenopathy. Degenerative changes and scoliosis in the lumbar spine. No destructive bone lesions. 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 3.3 cm abdominal aortic aneurysm with 1.7 cm right iliac and 1.6 cm left iliac aneurysms. Nonobstructing stone in the lower pole right kidney. Right-sided hydronephrosis suggesting ureteropelvic junction obstruction. Bilateral renal cysts. Diverticulosis of the colon without evidence of diverticulitis. Uterine fibroids. Electronically Signed   By: WLucienne CapersM.D.   On: 04/10/2016 01:31   I have personally reviewed and evaluated these images and lab results as part of my medical decision-making.   EKG Interpretation None      MDM   Final diagnoses:  Vomiting and diarrhea  Dehydration  UPJ (ureteropelvic junction) obstruction    Plan discharge  IRolland Porter MD, FACEP   .   IRolland Porter MD 04/10/16 [phone]

## 2016-04-09 NOTE — Progress Notes (Signed)
   Subjective:    Patient ID: Melody Casey, female    DOB: January 11, 1946, 70 y.o.   MRN: [id number]  Diarrhea  This is a new problem. The current episode started yesterday. The problem occurs more than 10 times per day. The problem has been unchanged. Diarrhea characteristics: black. The patient states that diarrhea awakens her from sleep. Associated symptoms include abdominal pain. Pertinent negatives include no bloating, chills, fever, headaches, increased  flatus or vomiting. Associated symptoms comments: Nausea . There are no known risk factors. She has tried nothing for the symptoms. The treatment provided no relief.      Review of Systems  Constitutional: Negative.  Negative for fever and chills.  HENT: Negative.   Eyes: Negative.   Respiratory: Negative.  Negative for shortness of breath.   Cardiovascular: Negative.  Negative for palpitations.  Gastrointestinal: Positive for abdominal pain and diarrhea. Negative for vomiting, bloating and flatus.  Endocrine: Negative.   Genitourinary: Negative.   Musculoskeletal: Negative.   Neurological: Negative.  Negative for headaches.  Hematological: Negative.   Psychiatric/Behavioral: Negative.   All other systems reviewed and are negative.      Objective:   Physical Exam  Constitutional: She is oriented to person, place, and time. She appears well-developed and well-nourished. No distress.  HENT:  Head: Normocephalic.  Eyes: Pupils are equal, round, and reactive to light.  Neck: Normal range of motion. Neck supple. No thyromegaly present.  Cardiovascular: Normal rate, regular rhythm, normal heart sounds and intact distal pulses.   No murmur heard. Pulmonary/Chest: Effort normal and breath sounds normal. No respiratory distress. She has no wheezes.  Abdominal: Soft. Bowel sounds are normal. She exhibits no distension. There is no tenderness.  Musculoskeletal: Normal range of motion. She exhibits no edema or tenderness.  Neurological:  She is alert and oriented to person, place, and time.  Skin: Skin is warm and dry.  Psychiatric: She has a normal mood and affect. Her behavior is normal. Judgment and thought content normal.  Vitals reviewed.   BP 113/67 mmHg  Pulse 87  Temp(Src) 98.2 F (36.8 C) (Oral)  Ht '5\' 7"'$  (1.702 m)  Wt 173 lb (78.472 kg)  BMI 27.09 kg/m2       Assessment & Plan:  1. Diarrhea, unspecified type - Anemia Profile B - Fecal occult blood, imunochemical; Future - Fingerstick Hemoglobin  2. Black stool - Anemia Profile B - Fecal occult blood, imunochemical; Future - Fingerstick Hemoglobin  3. High risk medication use - Fingerstick Hemoglobin  Hgb 9.8 and is on warfarin. PT told to go to ED for GI bleed.   Evelina Dun, FNP

## 2016-04-10 ENCOUNTER — Ambulatory Visit: Payer: Medicare Other | Admitting: Family Medicine

## 2016-04-10 ENCOUNTER — Emergency Department (HOSPITAL_COMMUNITY): Payer: Medicare Other

## 2016-04-10 DIAGNOSIS — N1330 Unspecified hydronephrosis: Secondary | ICD-10-CM | POA: Diagnosis not present

## 2016-04-10 LAB — ANEMIA PROFILE B
BASOS ABS: 0 10*3/uL (ref 0.0–0.2)
Basos: 0 %
EOS (ABSOLUTE): 0.1 10*3/uL (ref 0.0–0.4)
Eos: 1 %
FOLATE: 18.9 ng/mL (ref >3.0)
Ferritin: 104 ng/mL (ref 15–150)
Hematocrit: 31.7 % — ABNORMAL LOW (ref 34.0–46.6)
Hemoglobin: 10.3 g/dL — ABNORMAL LOW (ref 11.1–15.9)
IMMATURE GRANULOCYTES: 0 %
Immature Grans (Abs): 0 10*3/uL (ref 0.0–0.1)
Iron Saturation: 7 % — CL (ref 15–55)
Iron: 24 ug/dL — ABNORMAL LOW (ref 27–139)
LYMPHS ABS: 1.5 10*3/uL (ref 0.7–3.1)
Lymphs: 21 %
MCH: 23 pg — AB (ref 26.6–33.0)
MCHC: 32.5 g/dL (ref 31.5–35.7)
MCV: 71 fL — ABNORMAL LOW (ref 79–97)
MONOS ABS: 0.7 10*3/uL (ref 0.1–0.9)
Monocytes: 10 %
NEUTROS PCT: 68 %
Neutrophils Absolute: 4.9 10*3/uL (ref 1.4–7.0)
PLATELETS: 301 10*3/uL (ref 150–379)
RBC: 4.47 x10E6/uL (ref 3.77–5.28)
RDW: 19.9 % — ABNORMAL HIGH (ref 12.3–15.4)
Retic Ct Pct: 0.8 % (ref 0.6–2.6)
Total Iron Binding Capacity: 345 ug/dL (ref 250–450)
UIBC: 321 ug/dL (ref 118–369)
Vitamin B-12: 464 pg/mL (ref 211–946)
WBC: 7.2 10*3/uL (ref 3.4–10.8)

## 2016-04-10 LAB — POC OCCULT BLOOD, ED: FECAL OCCULT BLD: NEGATIVE

## 2016-04-10 MED ORDER — ONDANSETRON HCL 4 MG/2ML IJ SOLN
4.0000 mg | Freq: Once | INTRAMUSCULAR | Status: AC
  Administered 2016-04-10: 4 mg via INTRAVENOUS
  Filled 2016-04-10: qty 2

## 2016-04-10 MED ORDER — IOPAMIDOL (ISOVUE-300) INJECTION 61%
100.0000 mL | Freq: Once | INTRAVENOUS | Status: AC | PRN
  Administered 2016-04-10: 100 mL via INTRAVENOUS

## 2016-04-10 MED ORDER — SODIUM CHLORIDE 0.9 % IV BOLUS (SEPSIS)
500.0000 mL | Freq: Once | INTRAVENOUS | Status: AC
  Administered 2016-04-10: 500 mL via INTRAVENOUS

## 2016-04-10 MED ORDER — SODIUM CHLORIDE 0.9 % IV BOLUS (SEPSIS)
1000.0000 mL | Freq: Once | INTRAVENOUS | Status: AC
  Administered 2016-04-10: 1000 mL via INTRAVENOUS

## 2016-04-10 MED ORDER — DIATRIZOATE MEGLUMINE & SODIUM 66-10 % PO SOLN
ORAL | Status: AC
  Filled 2016-04-10: qty 30

## 2016-04-10 NOTE — ED Notes (Signed)
Pt ambulated to restroom with steady and even gait. 

## 2016-04-10 NOTE — ED Notes (Signed)
Pt resting with eyes closed, appears to be in no distress. Respirations are even and unlabored.

## 2016-04-10 NOTE — ED Notes (Signed)
Pt states she began having dark "olive green" stools two days ago, denies N/V. Pt received a call today stating she needed to come to the ED because she has a low hemoglobin level. Pt reports taking iron pills.

## 2016-04-10 NOTE — Discharge Instructions (Signed)
Drink plenty of fluids to prevent dehydration. Avoid milk products until the diarrhea is gone. You can take Imodium over-the-counter if needed for diarrhea. Call lines urology to get a follow-up appointment to look at this blockage of the tube that drains urine from your kidney. That is an incidental finding and not the cause of your symptoms tonight. Return to the ED if you get fever, you continue to have diarrhea or feeling or getting dehydrated again, your abdominal pain gets constant and worse. Dehydration Dehydration means your body does not have as much fluid as it needs. Your kidneys, brain, and heart will not work properly without the right amount of fluids and salt. Older adults are more likely to become dehydrated than younger adults. This is because:   Their bodies do not hold water as well.  Their bodies do not respond to temperature changes as well.  They do not get thirsty as easily or as quickly. HOME CARE  Ask your doctor how to replace body fluid losses (rehydrate).  Drink enough fluids to keep your pee (urine) clear or pale yellow.  Drink small amounts of fluids often if you feel sick to your stomach (nauseous) or throw up (vomit).  Eat like you normally do.  Avoid:  Foods or drinks high in sugar.  Bubbly (carbonated) drinks.  Juice.  Very hot or cold fluids.  Drinks with caffeine.  Fatty, greasy foods.  Alcohol.  Tobacco.  Eating too much.  Gelatin desserts.  Wash your hands to avoid spreading germs (bacteria, viruses).  Only take medicine as told by your doctor.  Keep all doctor visits as told. GET HELP IF:  You have belly (abdominal) pain that gets worse or stays in one spot (localizes).  You have a rash, stiff neck, or bad headache.  You get easily annoyed, sleepy, or are hard to wake up.  You feel weak, dizzy, or very thirsty.  You have a fever. GET HELP RIGHT AWAY IF:   You cannot drink fluid without throwing up.  You get worse even  with treatment.  You throw up often.  You have watery poop (diarrhea) often.  Your vomit has blood in it or looks greenish.  Your poop (stool) has blood in it or looks black and tarry.  You have not peed in 6 to 8 hours or have only peed a small amount of very dark pee.  You pass out (faint). MAKE SURE YOU:   Understand these instructions.  Will watch your condition.  Will get help right away if you are not doing well or get worse.   This information is not intended to replace advice given to you by your health care provider. Make sure you discuss any questions you have with your health care provider.   Document Released: 11/15/2011 Document Revised: 12/01/2013 Document Reviewed: 08/03/2013 Elsevier Interactive Patient Education 2016 [employer].  Diarrhea Diarrhea is watery poop (stool). It can make you feel weak, tired, thirsty, or give you a dry mouth (signs of dehydration). Watery poop is a sign of another problem, most often an infection. It often lasts 2-3 days. It can last longer if it is a sign of something serious. Take care of yourself as told by your doctor. HOME CARE   Drink 1 cup (8 ounces) of fluid each time you have watery poop.  Do not drink the following fluids:  Those that contain simple sugars (fructose, glucose, galactose, lactose, sucrose, maltose).  Sports drinks.  Fruit juices.  Whole milk products.  Strang with caffeine (coffee, tea, soda) or alcohol.  Oral rehydration solution may be used if the doctor says it is okay. You may make your own solution. Follow this recipe:   - teaspoon table salt.   teaspoon baking soda.   teaspoon salt substitute containing potassium chloride.  1 tablespoons sugar.  1 liter (34 ounces) of water.  Avoid the following foods:  High fiber foods, such as raw fruits and vegetables.  Nuts, seeds, and whole grain breads and cereals.   Those that are sweetened with sugar alcohols (xylitol,  sorbitol, mannitol).  Try eating the following foods:  Starchy foods, such as rice, toast, pasta, low-sugar cereal, oatmeal, baked potatoes, crackers, and bagels.  Bananas.  Applesauce.  Eat probiotic-rich foods, such as yogurt and milk products that are fermented.  Wash your hands well after each time you have watery poop.  Only take medicine as told by your doctor.  Take a warm bath to help lessen burning or pain from having watery poop. GET HELP RIGHT AWAY IF:   You cannot drink fluids without throwing up (vomiting).  You keep throwing up.  You have blood in your poop, or your poop looks black and tarry.  You do not pee (urinate) in 6-8 hours, or there is only a small amount of very dark pee.  You have belly (abdominal) pain that gets worse or stays in the same spot (localizes).  You are weak, dizzy, confused, or light-headed.  You have a very bad headache.  Your watery poop gets worse or does not get better.  You have a fever or lasting symptoms for more than 2-3 days.  You have a fever and your symptoms suddenly get worse. MAKE SURE YOU:   Understand these instructions.  Will watch your condition.  Will get help right away if you are not doing well or get worse.   This information is not intended to replace advice given to you by your health care provider. Make sure you discuss any questions you have with your health care provider.   Document Released: 05/14/2008 Document Revised: 12/17/2014 Document Reviewed: 08/03/2012 Elsevier Interactive Patient Education [employer].

## 2016-04-10 NOTE — ED Notes (Signed)
Occult blood resulted Negative, MD Knapp notified.

## 2016-04-12 ENCOUNTER — Encounter: Payer: Self-pay | Admitting: *Deleted

## 2016-04-19 DIAGNOSIS — M1712 Unilateral primary osteoarthritis, left knee: Secondary | ICD-10-CM | POA: Diagnosis not present

## 2016-04-23 ENCOUNTER — Ambulatory Visit (INDEPENDENT_AMBULATORY_CARE_PROVIDER_SITE_OTHER): Payer: Medicare Other | Admitting: Family

## 2016-04-23 ENCOUNTER — Encounter: Payer: Self-pay | Admitting: Family

## 2016-04-23 VITALS — BP 128/81 | HR 74 | Temp 97.4°F | Ht 68.0 in | Wt 173.6 lb

## 2016-04-23 DIAGNOSIS — D509 Iron deficiency anemia, unspecified: Secondary | ICD-10-CM | POA: Diagnosis not present

## 2016-04-23 DIAGNOSIS — K5900 Constipation, unspecified: Secondary | ICD-10-CM

## 2016-04-23 DIAGNOSIS — Z79899 Other long term (current) drug therapy: Secondary | ICD-10-CM | POA: Diagnosis not present

## 2016-04-23 DIAGNOSIS — Z09 Encounter for follow-up examination after completed treatment for conditions other than malignant neoplasm: Secondary | ICD-10-CM | POA: Diagnosis not present

## 2016-04-23 NOTE — Progress Notes (Addendum)
   Subjective:    Patient ID: Melody Casey, female    DOB: Oct 21, 1946, 70 y.o.   MRN: [id number]  HPI PT presents to the office today to recheck anemia. Pt was seen in the office with a Hgb of 9.8 and was having dark tarry diarrhea. PT is on warfarin and was sent to ED to be evaluated. PT had a negative CT and discharged. PT states the diarrhea has resolved and was started on Iron BID and now she feels constipated. Pt states it has been two days since her last BM. PT has taken a stool softener with no relief.   *Hosptial notes were reviewed.   Review of Systems  All other systems reviewed and are negative.      Objective:   Physical Exam  Constitutional: She is oriented to person, place, and time. She appears well-developed and well-nourished. No distress.  HENT:  Head: Normocephalic and atraumatic.  Eyes: Pupils are equal, round, and reactive to light.  Neck: Normal range of motion. Neck supple. No thyromegaly present.  Cardiovascular: Normal rate, regular rhythm, normal heart sounds and intact distal pulses.   No murmur heard. Pulmonary/Chest: Effort normal and breath sounds normal. No respiratory distress. She has no wheezes.  Abdominal: Soft. Bowel sounds are normal. She exhibits no distension. There is tenderness (mild LLQ).  Musculoskeletal: Normal range of motion. She exhibits no edema.  Neurological: She is alert and oriented to person, place, and time.  Skin: Skin is warm and dry.  Psychiatric: She has a normal mood and affect. Her behavior is normal. Judgment and thought content normal.  Vitals reviewed.    BP 128/81 mmHg  Pulse 74  Temp(Src) 97.4 F (36.3 C) (Oral)  Ht '5\' 8"'$  (1.727 m)  Wt 173 lb 9.6 oz (78.744 kg)  BMI 26.40 kg/m2     Assessment & Plan:  1. Anemia, iron deficiency - Anemia Profile B  2. Constipation, unspecified constipation type - Anemia Profile B  3. Hospital discharge follow-up - Anemia Profile B  Continue iron BID Force fluids Start  MOM RTO prn and keep chronic follow up with PCP  Evelina Dun, FNP

## 2016-04-23 NOTE — Patient Instructions (Addendum)
Iron Deficiency Anemia, Adult Anemia is a condition in which there are less red blood cells or hemoglobin in the blood than normal. Hemoglobin is the part of red blood cells that carries oxygen. Iron deficiency anemia is anemia caused by too little iron. It is the most common type of anemia. It may leave you tired and short of breath. CAUSES   Lack of iron in the diet.  Poor absorption of iron, as seen with intestinal disorders.  Intestinal bleeding.  Heavy periods. SIGNS AND SYMPTOMS  Mild anemia may not be noticeable. Symptoms may include:  Fatigue.  Headache.  Pale skin.  Weakness.  Tiredness.  Shortness of breath.  Dizziness.  Cold hands and feet.  Fast or irregular heartbeat. DIAGNOSIS  Diagnosis requires a thorough evaluation and physical exam by your health care provider. Blood tests are generally used to confirm iron deficiency anemia. Additional tests may be done to find the underlying cause of your anemia. These may include:  Testing for blood in the stool (fecal occult blood test).  A procedure to see inside the colon and rectum (colonoscopy).  A procedure to see inside the esophagus and stomach (endoscopy). TREATMENT  Iron deficiency anemia is treated by correcting the cause of the deficiency. Treatment may involve:  Adding iron-rich foods to your diet.  Taking iron supplements. Pregnant or breastfeeding women need to take extra iron because their normal diet usually does not provide the required amount.  Taking vitamins. Vitamin C improves the absorption of iron. Your health care provider may recommend that you take your iron tablets with a glass of orange juice or vitamin C supplement.  Medicines to make heavy menstrual flow lighter.  Surgery. HOME CARE INSTRUCTIONS   Take iron as directed by your health care provider.  If you cannot tolerate taking iron supplements by mouth, talk to your health care provider about taking them through a vein  (intravenously) or an injection into a muscle.  For the best iron absorption, iron supplements should be taken on an empty stomach. If you cannot tolerate them on an empty stomach, you may need to take them with food.  Do not drink milk or take antacids at the same time as your iron supplements. Milk and antacids may interfere with the absorption of iron.  Iron supplements can cause constipation. Make sure to include fiber in your diet to prevent constipation. A stool softener may also be recommended.  Take vitamins as directed by your health care provider.  Eat a diet rich in iron. Foods high in iron include liver, lean beef, whole-grain bread, eggs, dried fruit, and dark green leafy vegetables. SEEK IMMEDIATE MEDICAL CARE IF:   You faint. If this happens, do not drive. Call your local emergency services (911 in [place] if no other help is available.  You have chest pain.  You feel nauseous or vomit.  You have severe or increased shortness of breath with activity.  You feel weak.  You have a rapid heartbeat.  You have unexplained sweating.  You become light-headed when getting up from a chair or bed. MAKE SURE YOU:   Understand these instructions.  Will watch your condition.  Will get help right away if you are not doing well or get worse.   This information is not intended to replace advice given to you by your health care provider. Make sure you discuss any questions you have with your health care provider.   Document Released: 11/23/2000 Document Revised: 12/17/2014 Document Reviewed: 08/03/2013 Elsevier   Interactive Patient Education [employer]. About Constipation  Constipation Overview Constipation is the most common gastrointestinal complaint - about 4 million Americans experience constipation and make 2.5 million physician visits a year to get help for the problem.  Constipation can occur when the colon absorbs too much water, the colon's muscle contraction is  slow or sluggish, and/or there is delayed transit time through the colon.  The result is stool that is hard and dry.  Indicators of constipation include straining during bowel movements greater than 25% of the time, having fewer than three bowel movements per week, and/or the feeling of incomplete evacuation.  There are established guidelines ([place] II ) for defining constipation. A person needs to have two or more of the following symptoms for at least 12 weeks (not necessarily consecutive) in the preceding 12 months: . Straining in  greater than 25% of bowel movements . Lumpy or hard stools in greater than 25% of bowel movements . Sensation of incomplete emptying in greater than 25% of bowel movements . Sensation of anorectal obstruction/blockade in greater than 25% of bowel movements . Manual maneuvers to help empty greater than 25% of bowel movements (e.g., digital evacuation, support of the pelvic floor)  . Less than  3 bowel movements/week . Loose stools are not present, and criteria for irritable bowel syndrome are insufficient  Common Causes of Constipation . Lack of fiber in your diet . Lack of physical activity . Medications, including iron and calcium supplements  . Dairy intake . Dehydration . Abuse of laxatives  Travel  Irritable Bowel Syndrome  Pregnancy  Luteal phase of menstruation (after ovulation and before menses)  Colorectal problems  Intestinal Dysfunction  Treating Constipation  There are several ways of treating constipation, including changes to diet and exercise, use of laxatives, adjustments to the pelvic floor, and scheduled toileting.  These treatments include: . increasing fiber and fluids in the diet  . increasing physical activity . learning muscle coordination   learning proper toileting techniques and toileting modifications   designing and sticking  to a toileting schedule     2007, [employer] Doc.22

## 2016-04-24 ENCOUNTER — Telehealth: Payer: Self-pay | Admitting: *Deleted

## 2016-04-24 LAB — ANEMIA PROFILE B
Basophils Absolute: 0 10*3/uL (ref 0.0–0.2)
Basos: 0 %
EOS (ABSOLUTE): 0.1 10*3/uL (ref 0.0–0.4)
Eos: 1 %
Ferritin: 67 ng/mL (ref 15–150)
Folate: 5.5 ng/mL
Hematocrit: 35.1 % (ref 34.0–46.6)
Hemoglobin: 11.2 g/dL (ref 11.1–15.9)
Immature Grans (Abs): 0 10*3/uL (ref 0.0–0.1)
Immature Granulocytes: 0 %
Iron Saturation: 8 % — CL (ref 15–55)
Iron: 28 ug/dL (ref 27–139)
Lymphocytes Absolute: 2.2 10*3/uL (ref 0.7–3.1)
Lymphs: 19 %
MCH: 23.6 pg — ABNORMAL LOW (ref 26.6–33.0)
MCHC: 31.9 g/dL (ref 31.5–35.7)
MCV: 74 fL — ABNORMAL LOW (ref 79–97)
Monocytes Absolute: 0.8 10*3/uL (ref 0.1–0.9)
Monocytes: 8 %
Neutrophils Absolute: 8.1 10*3/uL — ABNORMAL HIGH (ref 1.4–7.0)
Neutrophils: 72 %
Platelets: 351 10*3/uL (ref 150–379)
RBC: 4.75 x10E6/uL (ref 3.77–5.28)
RDW: 22.4 % — ABNORMAL HIGH (ref 12.3–15.4)
Retic Ct Pct: 1.4 % (ref 0.6–2.6)
Total Iron Binding Capacity: 343 ug/dL (ref 250–450)
UIBC: 315 ug/dL (ref 118–369)
Vitamin B-12: 405 pg/mL (ref 211–946)
WBC: 11.2 10*3/uL — ABNORMAL HIGH (ref 3.4–10.8)

## 2016-04-24 NOTE — Telephone Encounter (Signed)
Pt notified of results Verbalizes understanding 

## 2016-04-24 NOTE — Telephone Encounter (Signed)
-----   Message from Sharion Balloon, [place] sent at 04/24/2016  8:43 AM EDT ----- Hgb improved Iron levels low but improved- Continue iron BID and eat iron enriched foods

## 2016-04-25 ENCOUNTER — Telehealth: Payer: Self-pay | Admitting: Family Medicine

## 2016-04-26 MED ORDER — TRAMADOL HCL 50 MG PO TABS: 50.0000 mg | ORAL_TABLET | Freq: Three times a day (TID) | ORAL | Status: DC | PRN

## 2016-04-26 NOTE — Telephone Encounter (Signed)
Patient aware that rx has been called to pharmacy.

## 2016-04-26 NOTE — Telephone Encounter (Signed)
Please call in RX

## 2016-04-30 ENCOUNTER — Telehealth: Payer: Self-pay | Admitting: Cardiology

## 2016-04-30 NOTE — Telephone Encounter (Signed)
°*  STAT* If patient is at the pharmacy, call can be transferred to refill team.   1. Which medications need to be refilled? (please list name of each medication and dose if known) Tikosyn '250mg'$   2. Which pharmacy/location (including street and city if local pharmacy) is medication to be sent to? Pt stated it comes to Dr Hochrein's office  3. Do they need a 30 day or 90 day supply? 90 day   Pt ID# [id number].

## 2016-05-01 NOTE — Telephone Encounter (Signed)
Message send to Erasmo Downer to order from [employer] for pt to pick up.

## 2016-05-01 NOTE — Telephone Encounter (Signed)
tikosyn ordered

## 2016-05-08 ENCOUNTER — Ambulatory Visit (INDEPENDENT_AMBULATORY_CARE_PROVIDER_SITE_OTHER): Payer: Medicare Other | Admitting: Pharmacist Clinician (PhC)/ Clinical Pharmacy Specialist

## 2016-05-08 DIAGNOSIS — Z79899 Other long term (current) drug therapy: Secondary | ICD-10-CM | POA: Diagnosis not present

## 2016-05-08 DIAGNOSIS — I714 Abdominal aortic aneurysm, without rupture, unspecified: Secondary | ICD-10-CM

## 2016-05-08 DIAGNOSIS — I481 Persistent atrial fibrillation: Secondary | ICD-10-CM | POA: Diagnosis not present

## 2016-05-08 DIAGNOSIS — I4819 Other persistent atrial fibrillation: Secondary | ICD-10-CM

## 2016-05-08 LAB — COAGUCHEK XS/INR WAIVED
INR: 2.2 — AB (ref 0.9–1.1)
Prothrombin Time: 25.9 s

## 2016-05-08 NOTE — Patient Instructions (Signed)
Anticoagulation Dose Instructions as of 05/08/2016      Melody Casey Tue Wed Thu Fri Sat   New Dose 5 mg 5 mg 5 mg 5 mg 5 mg 5 mg 5 mg    Description        Continue to take '5mg'$  (1 tablet) of warfarin daily.  INR today 2.2

## 2016-05-10 DIAGNOSIS — Z853 Personal history of malignant neoplasm of breast: Secondary | ICD-10-CM | POA: Diagnosis not present

## 2016-05-14 DIAGNOSIS — R799 Abnormal finding of blood chemistry, unspecified: Secondary | ICD-10-CM | POA: Diagnosis not present

## 2016-05-14 DIAGNOSIS — M130 Polyarthritis, unspecified: Secondary | ICD-10-CM | POA: Diagnosis not present

## 2016-05-14 DIAGNOSIS — M25442 Effusion, left hand: Secondary | ICD-10-CM | POA: Diagnosis not present

## 2016-05-14 DIAGNOSIS — M25441 Effusion, right hand: Secondary | ICD-10-CM | POA: Diagnosis not present

## 2016-05-15 ENCOUNTER — Other Ambulatory Visit: Payer: Self-pay | Admitting: *Deleted

## 2016-05-15 MED ORDER — POTASSIUM CHLORIDE CRYS ER 20 MEQ PO TBCR: 20.0000 meq | EXTENDED_RELEASE_TABLET | Freq: Two times a day (BID) | ORAL | Status: DC

## 2016-05-15 NOTE — Progress Notes (Signed)
HPI The patient presents for followup of atrial fib.  Since I last saw her she was in the hospital for management of diverticulitis in late Sept and sepsis in early Sept. From a cardiac standpoint she's been doing well with Tikosyn. The patient denies any new symptoms such as chest discomfort, neck or arm discomfort. There has been no new shortness of breath, PND or orthopnea. There have been no reported palpitations, presyncope or syncope.  With her usual activity she denies any symptoms.    Of note I did review her hospital records. There was a questionable TIA. However, CT of her head was normal. Echocardiogram was unremarkable. No obvious deficit prolapse although she might have a little trouble with her handwriting. She does say that she might have to have surgery on her colon because of some bleeding. I reviewed labs and her hemoglobin is 10.4 which is up from her lowest and stable. It was checked a few days ago.  Saw in nov   Allergies  Allergen Reactions  . Miralax [Polyethylene Glycol] Swelling and Rash    Took [facility] brand developed rash. Patient states she tolerated name brand MiraLax in the past.  09/01/15. Patient states she had diffuse swelling including swelling of her lips.   . Vancomycin Rash and Shortness Of Breath  . Sulfa Antibiotics Rash    All over rash  . Sulfacetamide Sodium Rash    All over rash  . Acetaminophen Hives  . Banana Other (See Comments)    Unknown  . Oxycodone-Acetaminophen Itching  . Penicillins Other (See Comments)    Pt had so much as a child she's immune to it  . Latex Rash  . Tape Rash    Current Outpatient Prescriptions  Medication Sig Dispense Refill  . ALPRAZolam (XANAX) 0.25 MG tablet Take 0.25 mg by mouth daily as needed for anxiety.     Marland Kitchen amLODipine (NORVASC) 2.5 MG tablet TAKE ONE TABLET BY MOUTH IN THE MORNING 30 tablet 8  . anastrozole (ARIMIDEX) 1 MG tablet Take 1 tablet (1 mg total) by mouth daily. 90 tablet 3  . atorvastatin  (LIPITOR) 40 MG tablet TAKE ONE AND ONE-HALF TABLETS BY MOUTH IN THE EVENING 135 tablet 0  . calcium-vitamin D (OSCAL WITH D) 500-200 MG-UNIT per tablet Take 1 tablet by mouth 2 (two) times daily.    Marland Kitchen dofetilide (TIKOSYN) 250 MCG capsule Take 1 capsule (250 mcg total) by mouth 2 (two) times daily. 60 capsule 11  . ferrous sulfate 324 (65 Fe) MG TBEC 1 pill by mouth twice daily (Patient taking differently: Take 325 mg by mouth 2 (two) times daily. 1 pill by mouth twice daily) 60 tablet 3  . lisinopril (PRINIVIL,ZESTRIL) 40 MG tablet Take 1 tablet (40 mg total) by mouth every morning. 90 tablet 1  . magnesium oxide (MAG-OX) 400 MG tablet Take 400 mg twice a day with food (Patient taking differently: Take 400 mg by mouth 2 (two) times daily with a meal. ) 60 tablet 6  . metFORMIN (GLUCOPHAGE) 500 MG tablet Take 1 tablet (500 mg total) by mouth daily. 90 tablet 1  . niacin (NIASPAN) 500 MG CR tablet Take 1 tablet (500 mg total) by mouth at bedtime. 60 tablet 3  . nitroGLYCERIN (NITROSTAT) 0.4 MG SL tablet Place 1 tablet (0.4 mg total) under the tongue every 5 (five) minutes as needed for chest pain. 25 tablet 5  . ondansetron (ZOFRAN) 4 MG tablet Take 1 tablet (4 mg total) by mouth  every 6 (six) hours as needed for nausea. 20 tablet 0  . pantoprazole (PROTONIX) 40 MG tablet Take 1 tablet (40 mg total) by mouth 2 (two) times daily before a meal. 60 tablet 3  . potassium chloride SA (K-DUR,KLOR-CON) 20 MEQ tablet Take 1 tablet (20 mEq total) by mouth 2 (two) times daily. 180 tablet 3  . traMADol (ULTRAM) 50 MG tablet Take 1-2 tablets (50-100 mg total) by mouth every 8 (eight) hours as needed. 60 tablet 0  . warfarin (COUMADIN) 5 MG tablet Take 1-1.5 tablets (5-7.5 mg total) by mouth daily. (Patient taking differently: Take 5 mg by mouth daily. ) 102 tablet 3   No current facility-administered medications for this visit.    Past Medical History  Diagnosis Date  . HLD (hyperlipidemia)   . HTN  (hypertension)   . Arthritis   . Foot deformity     right  . CAD (coronary artery disease)     a. NSTEMI 02/2011: occ mid Cx, DES to OM2, residual nonobst LAD dz.  . Tobacco abuse     stopped smoking 2012  . Diabetes mellitus   . Atrial fibrillation ([place])     a. Dx 03/2011 - on tikosyn/coumadin.  . Anxiety   . Diverticulitis of colon      2008, 04/2011, 12/2014, 08/2015  . Ureteral obstruction     History of gross hematuria/right hydronephrosis 2/2 to uteropelvic junction obstruction, s/p cystoscopy in January 2007 with bilateral retrograde pyelography, right ureter arthroscopy, right ureteral stent placement, bladder biopsies, stent removal since then.  . Wears dentures     top  . Cancer ([facility])     breast  . Diverticulitis   . Hemangioma     liver  . AAA (abdominal aortic aneurysm) ([place])   . Osteoporosis     Past Surgical History  Procedure Laterality Date  . Tonsillectomy and adenoidectomy    . Knee arthroscopy      left  . Tubal ligation    . 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 Ureteral surgery  2011    rt ureterostomy-stent  . Coronary stent placement  2012  . Cardiac catheterization  2012    stent  . Breast lumpectomy with needle localization and axillary sentinel lymph node bx Right 03/01/2014    Procedure: BREAST LUMPECTOMY WITH NEEDLE LOCALIZATION AND AXILLARY SENTINEL LYMPH NODE BX;  Surgeon: Edward Jolly, MD;  Location: [place];  Service: General;  Laterality: Right;  . Colonoscopy with propofol N/A 06/29/2014    Dr. Wynetta Emery: universal diverticulosis  . Esophagogastroduodenoscopy (egd) with propofol N/A 11/01/2015    Procedure: ESOPHAGOGASTRODUODENOSCOPY (EGD) WITH PROPOFOL;  Surgeon: Danie Binder, MD;  Location: [facility];  Service: Endoscopy;  Laterality: N/A;  . Biopsy N/A 11/01/2015    Procedure: BIOPSY;  Surgeon: Danie Binder, MD;  Location: [facility];  Service: Endoscopy;  Laterality: N/A;    ROS:  As  stated in the HPI and negative for all other systems.  PHYSICAL EXAM There were no vitals taken for this visit. 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 05/15/2016   ASSESSMENT AND PLAN  HYPERTENSION -  Her BP has been well controlled on Norvasc.   No change in therapy is indicated.   Atrial fibrillation -  She is maintaining sinus rhythm on Tikosyn.  Her QT is OK. I did reduce her dose to 250 mcg twice daily.  She has had a low magnesium level consistently check one again today.  CAD - The patient has no new sypmtoms.  No further cardiovascular testing is indicated.  She can stop ASA.   ANEMIA - This is stable. I would not see this or her questionable TIA is a contraindication to surgery on her bowels should she need it. She is going to confer with the surgeon here if she decides she wants to pursue possible surgery.  This encounter was created in error - please disregard.

## 2016-05-16 ENCOUNTER — Encounter: Payer: Medicare Other | Admitting: Cardiology

## 2016-05-16 ENCOUNTER — Encounter: Payer: Self-pay | Admitting: *Deleted

## 2016-05-19 ENCOUNTER — Other Ambulatory Visit: Payer: Self-pay | Admitting: Gastroenterology

## 2016-05-29 ENCOUNTER — Other Ambulatory Visit: Payer: Self-pay | Admitting: Cardiology

## 2016-06-06 ENCOUNTER — Ambulatory Visit: Payer: Medicare Other | Admitting: Physician Assistant

## 2016-06-06 DIAGNOSIS — M25442 Effusion, left hand: Secondary | ICD-10-CM | POA: Diagnosis not present

## 2016-06-06 DIAGNOSIS — M25441 Effusion, right hand: Secondary | ICD-10-CM | POA: Diagnosis not present

## 2016-06-06 DIAGNOSIS — Z79899 Other long term (current) drug therapy: Secondary | ICD-10-CM | POA: Diagnosis not present

## 2016-06-06 DIAGNOSIS — R799 Abnormal finding of blood chemistry, unspecified: Secondary | ICD-10-CM | POA: Diagnosis not present

## 2016-06-06 DIAGNOSIS — M25511 Pain in right shoulder: Secondary | ICD-10-CM | POA: Diagnosis not present

## 2016-06-06 DIAGNOSIS — M25512 Pain in left shoulder: Secondary | ICD-10-CM | POA: Diagnosis not present

## 2016-06-19 ENCOUNTER — Ambulatory Visit (INDEPENDENT_AMBULATORY_CARE_PROVIDER_SITE_OTHER): Payer: Medicare Other | Admitting: Pharmacist

## 2016-06-19 DIAGNOSIS — I714 Abdominal aortic aneurysm, without rupture, unspecified: Secondary | ICD-10-CM

## 2016-06-19 DIAGNOSIS — I481 Persistent atrial fibrillation: Secondary | ICD-10-CM | POA: Diagnosis not present

## 2016-06-19 DIAGNOSIS — Z79899 Other long term (current) drug therapy: Secondary | ICD-10-CM | POA: Diagnosis not present

## 2016-06-19 DIAGNOSIS — I4819 Other persistent atrial fibrillation: Secondary | ICD-10-CM

## 2016-06-19 LAB — COAGUCHEK XS/INR WAIVED
INR: 1.6 — AB (ref 0.9–1.1)
Prothrombin Time: 19.3 s

## 2016-06-19 NOTE — Patient Instructions (Signed)
Anticoagulation Dose Instructions as of 06/19/2016      Melody Casey Tue Wed Thu Fri Sat   New Dose 5 mg 5 mg 5 mg 5 mg 5 mg 7.5 mg 5 mg    Description        Take 1 and 1/2 tablets today - Tuesday, July 11th.  Then increase dose to 1 and 1/2 tablets on Fridays only, take 1 tablet all other day.     INR was 1.6 today

## 2016-06-27 DIAGNOSIS — M9903 Segmental and somatic dysfunction of lumbar region: Secondary | ICD-10-CM | POA: Diagnosis not present

## 2016-06-27 DIAGNOSIS — M5136 Other intervertebral disc degeneration, lumbar region: Secondary | ICD-10-CM | POA: Diagnosis not present

## 2016-06-28 DIAGNOSIS — M5136 Other intervertebral disc degeneration, lumbar region: Secondary | ICD-10-CM | POA: Diagnosis not present

## 2016-06-28 DIAGNOSIS — M9903 Segmental and somatic dysfunction of lumbar region: Secondary | ICD-10-CM | POA: Diagnosis not present

## 2016-07-02 DIAGNOSIS — M5136 Other intervertebral disc degeneration, lumbar region: Secondary | ICD-10-CM | POA: Diagnosis not present

## 2016-07-02 DIAGNOSIS — M79641 Pain in right hand: Secondary | ICD-10-CM | POA: Diagnosis not present

## 2016-07-02 DIAGNOSIS — M545 Low back pain: Secondary | ICD-10-CM | POA: Diagnosis not present

## 2016-07-02 DIAGNOSIS — M9903 Segmental and somatic dysfunction of lumbar region: Secondary | ICD-10-CM | POA: Diagnosis not present

## 2016-07-02 DIAGNOSIS — Z79899 Other long term (current) drug therapy: Secondary | ICD-10-CM | POA: Diagnosis not present

## 2016-07-02 DIAGNOSIS — M0570 Rheumatoid arthritis with rheumatoid factor of unspecified site without organ or systems involvement: Secondary | ICD-10-CM | POA: Diagnosis not present

## 2016-07-02 DIAGNOSIS — M25512 Pain in left shoulder: Secondary | ICD-10-CM | POA: Diagnosis not present

## 2016-07-02 DIAGNOSIS — M25511 Pain in right shoulder: Secondary | ICD-10-CM | POA: Diagnosis not present

## 2016-07-02 DIAGNOSIS — M79642 Pain in left hand: Secondary | ICD-10-CM | POA: Diagnosis not present

## 2016-07-03 DIAGNOSIS — M9903 Segmental and somatic dysfunction of lumbar region: Secondary | ICD-10-CM | POA: Diagnosis not present

## 2016-07-03 DIAGNOSIS — M5136 Other intervertebral disc degeneration, lumbar region: Secondary | ICD-10-CM | POA: Diagnosis not present

## 2016-07-04 DIAGNOSIS — E1151 Type 2 diabetes mellitus with diabetic peripheral angiopathy without gangrene: Secondary | ICD-10-CM | POA: Diagnosis not present

## 2016-07-04 DIAGNOSIS — L84 Corns and callosities: Secondary | ICD-10-CM | POA: Diagnosis not present

## 2016-07-04 DIAGNOSIS — L603 Nail dystrophy: Secondary | ICD-10-CM | POA: Diagnosis not present

## 2016-07-04 DIAGNOSIS — I739 Peripheral vascular disease, unspecified: Secondary | ICD-10-CM | POA: Diagnosis not present

## 2016-07-05 DIAGNOSIS — M9903 Segmental and somatic dysfunction of lumbar region: Secondary | ICD-10-CM | POA: Diagnosis not present

## 2016-07-05 DIAGNOSIS — M5136 Other intervertebral disc degeneration, lumbar region: Secondary | ICD-10-CM | POA: Diagnosis not present

## 2016-07-05 NOTE — Progress Notes (Signed)
HPI The patient presents for followup of atrial fib.  Since I last saw her she was in the ED with vomiting.  I have reviewed these records.  He's not had any new cardiac complaints. She hurt her back lifting a TV. She's having more trouble with joint problems and was being considered for treatment with methotrexate. She denies any new cardiovascular symptoms. The patient denies any new symptoms such as chest discomfort, neck or arm discomfort. There has been no new shortness of breath, PND or orthopnea. There have been no reported palpitations, presyncope or syncope.  She's not been as active because of the discomfort.  Allergies  Allergen Reactions  . Miralax [Polyethylene Glycol] Swelling and Rash    Took [facility] brand developed rash. Patient states she tolerated name brand MiraLax in the past.  09/01/15. Patient states she had diffuse swelling including swelling of her lips.   . Vancomycin Rash and Shortness Of Breath  . Sulfa Antibiotics Rash    All over rash  . Sulfacetamide Sodium Rash    All over rash  . Acetaminophen Hives  . Banana Other (See Comments)    Unknown  . Oxycodone-Acetaminophen Itching  . Penicillins Other (See Comments)    Pt had so much as a child she's immune to it  . Latex Rash  . Tape Rash    Current Outpatient Prescriptions  Medication Sig Dispense Refill  . ALPRAZolam (XANAX) 0.25 MG tablet Take 0.25 mg by mouth daily as needed for anxiety.     Marland Kitchen amLODipine (NORVASC) 2.5 MG tablet TAKE ONE TABLET BY MOUTH IN THE MORNING 30 tablet 8  . anastrozole (ARIMIDEX) 1 MG tablet Take 1 tablet (1 mg total) by mouth daily. 90 tablet 3  . atorvastatin (LIPITOR) 40 MG tablet Take 60 mg by mouth daily. Take 1 1/2 tablets (60 MG) daily.    . calcium-vitamin D (OSCAL WITH D) 500-200 MG-UNIT per tablet Take 1 tablet by mouth 2 (two) times daily.    Marland Kitchen dofetilide (TIKOSYN) 250 MCG capsule Take 1 capsule (250 mcg total) by mouth 2 (two) times daily. 60 capsule 11  . ferrous  sulfate 324 (65 Fe) MG TBEC 1 pill by mouth twice daily 60 tablet 3  . lisinopril (PRINIVIL,ZESTRIL) 40 MG tablet Take 1 tablet (40 mg total) by mouth every morning. 90 tablet 1  . magnesium oxide (MAG-OX) 400 MG tablet Take 400 mg twice a day with food 60 tablet 6  . metFORMIN (GLUCOPHAGE) 500 MG tablet Take 1 tablet (500 mg total) by mouth daily. 90 tablet 1  . niacin (NIASPAN) 500 MG CR tablet Take 1 tablet (500 mg total) by mouth at bedtime. 60 tablet 3  . nitroGLYCERIN (NITROSTAT) 0.4 MG SL tablet Place 1 tablet (0.4 mg total) under the tongue every 5 (five) minutes as needed for chest pain. 25 tablet 5  . ondansetron (ZOFRAN) 4 MG tablet Take 1 tablet (4 mg total) by mouth every 6 (six) hours as needed for nausea. 20 tablet 0  . pantoprazole (PROTONIX) 40 MG tablet TAKE ONE TABLET BY MOUTH TWICE DAILY BEFORE  A  MEAL 60 tablet 5  . potassium chloride SA (K-DUR,KLOR-CON) 20 MEQ tablet Take 1 tablet (20 mEq total) by mouth 2 (two) times daily. 180 tablet 3  . predniSONE (DELTASONE) 10 MG tablet Take 10-20 mg by mouth daily.    . traMADol (ULTRAM) 50 MG tablet Take 1-2 tablets (50-100 mg total) by mouth every 8 (eight) hours as needed. [place]  tablet 0  . warfarin (COUMADIN) 5 MG tablet Take 1-1.5 tablets (5-7.5 mg total) by mouth daily. (Patient taking differently: Take 5 mg by mouth daily. ) 102 tablet 3   No current facility-administered medications for this visit.     Past Medical History:  Diagnosis Date  . AAA (abdominal aortic aneurysm) ([place])   . Anxiety   . Arthritis   . Atrial fibrillation ([place])    a. Dx 03/2011 - on tikosyn/coumadin.  Marland Kitchen CAD (coronary artery disease)    a. NSTEMI 02/2011: occ mid Cx, DES to OM2, residual nonobst LAD dz.  . Cancer ([place])    breast  . Diabetes mellitus   . Diverticulitis   . Diverticulitis of colon     2008, 04/2011, 12/2014, 08/2015  . Foot deformity    right  . Hemangioma    liver  . HLD (hyperlipidemia)   . HTN (hypertension)   . Osteoporosis     . Tobacco abuse    stopped smoking 2012  . Ureteral obstruction    History of gross hematuria/right hydronephrosis 2/2 to uteropelvic junction obstruction, s/p cystoscopy in January 2007 with bilateral retrograde pyelography, right ureter arthroscopy, right ureteral stent placement, bladder biopsies, stent removal since then.  . Wears dentures    top    Past Surgical History:  Procedure Laterality Date  . BIOPSY N/A 11/01/2015   Procedure: BIOPSY;  Surgeon: Danie Binder, MD;  Location: [facility];  Service: Endoscopy;  Laterality: N/A;  . BREAST LUMPECTOMY WITH NEEDLE LOCALIZATION AND AXILLARY SENTINEL LYMPH NODE BX Right 03/01/2014   Procedure: BREAST LUMPECTOMY WITH NEEDLE LOCALIZATION AND AXILLARY SENTINEL LYMPH NODE BX;  Surgeon: Edward Jolly, MD;  Location: [facility];  Service: General;  Laterality: Right;  . CARDIAC CATHETERIZATION  2012   stent  . COLONOSCOPY WITH PROPOFOL N/A 06/29/2014   Dr. Wynetta Emery: universal diverticulosis  . CORONARY STENT PLACEMENT  2012  . ESOPHAGOGASTRODUODENOSCOPY (EGD) WITH PROPOFOL N/A 11/01/2015   Procedure: ESOPHAGOGASTRODUODENOSCOPY (EGD) WITH PROPOFOL;  Surgeon: Danie Binder, MD;  Location: [facility];  Service: Endoscopy;  Laterality: N/A;  . KNEE ARTHROSCOPY     left  . Right leg surgery  age 37   As a child for  ?scleroderma per patient  . TONSILLECTOMY AND ADENOIDECTOMY    . TUBAL LIGATION    . Ureteral surgery  2011   rt ureterostomy-stent    ROS:  As stated in the HPI and negative for all other systems.  PHYSICAL EXAM BP (!) 166/74   Pulse 65   Ht '5\' 8"'$  (1.727 m)   Wt 174 lb (78.9 kg)   BMI 26.46 kg/m  GENERAL: Well appearing  NECK: No jugular venous distention, waveform within normal limits, carotid upstroke brisk and symmetric, no bruits, no thyromegaly   LUNGS: Clear to auscultation bilaterally  CHEST: Unremarkable  HEART: PMI not displaced or sustained,S1 and S2 within normal limits, no S3, no S4, no  clicks, no rubs, no murmurs  ABD: Flat, positive bowel sounds normal in frequency in pitch, no bruits, no rebound, no guarding, no midline pulsatile mass, no hepatomegaly, no splenomegaly  EXT: 2 plus pulses throughout, no edema, no cyanosis no , 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 07/06/2016   ASSESSMENT AND PLAN  HYPERTENSION -  Her BP has been well controlled on Norvasc at home although  it is slightly elevated today.   No change in therapy is indicated.   Atrial fibrillation -  She is maintaining sinus rhythm on Tikosyn.  Her QT is OK. No change in therapy is indicated.  CAD - The patient has no new sypmtoms.  No further cardiovascular testing is indicated.  She can stop ASA.   Arthritis - She does not want to take methotrexate.  I do not see any interaction with Tikosyn.

## 2016-07-06 ENCOUNTER — Encounter: Payer: Self-pay | Admitting: Cardiology

## 2016-07-06 ENCOUNTER — Ambulatory Visit (INDEPENDENT_AMBULATORY_CARE_PROVIDER_SITE_OTHER): Payer: Medicare Other | Admitting: Cardiology

## 2016-07-06 VITALS — BP 166/74 | HR 65 | Ht 68.0 in | Wt 174.0 lb

## 2016-07-06 DIAGNOSIS — I481 Persistent atrial fibrillation: Secondary | ICD-10-CM

## 2016-07-06 DIAGNOSIS — I4819 Other persistent atrial fibrillation: Secondary | ICD-10-CM

## 2016-07-06 NOTE — Patient Instructions (Signed)
Your physician recommends that you schedule a follow-up appointment in: 6 Months  

## 2016-07-09 ENCOUNTER — Other Ambulatory Visit: Payer: Self-pay | Admitting: Pharmacist

## 2016-07-09 ENCOUNTER — Ambulatory Visit (INDEPENDENT_AMBULATORY_CARE_PROVIDER_SITE_OTHER): Payer: Medicare Other | Admitting: Pharmacist

## 2016-07-09 DIAGNOSIS — I4819 Other persistent atrial fibrillation: Secondary | ICD-10-CM

## 2016-07-09 DIAGNOSIS — I481 Persistent atrial fibrillation: Secondary | ICD-10-CM | POA: Diagnosis not present

## 2016-07-09 DIAGNOSIS — Z79899 Other long term (current) drug therapy: Secondary | ICD-10-CM | POA: Diagnosis not present

## 2016-07-09 DIAGNOSIS — I714 Abdominal aortic aneurysm, without rupture, unspecified: Secondary | ICD-10-CM

## 2016-07-09 LAB — COAGUCHEK XS/INR WAIVED
INR: 2.4 — AB (ref 0.9–1.1)
PROTHROMBIN TIME: 28.9 s

## 2016-07-10 LAB — ANEMIA PROFILE B
BASOS ABS: 0 10*3/uL (ref 0.0–0.2)
Basos: 0 %
EOS (ABSOLUTE): 0.1 10*3/uL (ref 0.0–0.4)
Eos: 1 %
Ferritin: 64 ng/mL (ref 15–150)
Folate: 16 ng/mL (ref >3.0)
HEMATOCRIT: 38.7 % (ref 34.0–46.6)
Hemoglobin: 12.6 g/dL (ref 11.1–15.9)
IMMATURE GRANS (ABS): 0 10*3/uL (ref 0.0–0.1)
IMMATURE GRANULOCYTES: 0 %
Iron Saturation: 16 % (ref 15–55)
Iron: 53 ug/dL (ref 27–139)
LYMPHS: 20 %
Lymphocytes Absolute: 1.5 10*3/uL (ref 0.7–3.1)
MCH: 26.9 pg (ref 26.6–33.0)
MCHC: 32.6 g/dL (ref 31.5–35.7)
MCV: 83 fL (ref 79–97)
MONOCYTES: 6 %
Monocytes Absolute: 0.4 10*3/uL (ref 0.1–0.9)
NEUTROS PCT: 73 %
Neutrophils Absolute: 5.4 10*3/uL (ref 1.4–7.0)
PLATELETS: 237 10*3/uL (ref 150–379)
RBC: 4.69 x10E6/uL (ref 3.77–5.28)
RDW: 21.9 % — AB (ref 12.3–15.4)
RETIC CT PCT: 0.8 % (ref 0.6–2.6)
Total Iron Binding Capacity: 328 ug/dL (ref 250–450)
UIBC: 275 ug/dL (ref 118–369)
Vitamin B-12: 378 pg/mL (ref 211–946)
WBC: 7.3 10*3/uL (ref 3.4–10.8)

## 2016-07-10 LAB — BMP8+EGFR
BUN/Creatinine Ratio: 24 (ref 12–28)
BUN: 21 mg/dL (ref 8–27)
CALCIUM: 10.3 mg/dL (ref 8.7–10.3)
CHLORIDE: 101 mmol/L (ref 96–106)
CO2: 25 mmol/L (ref 18–29)
Creatinine, Ser: 0.86 mg/dL (ref 0.57–1.00)
GFR calc Af Amer: 78 mL/min/{1.73_m2} (ref >59)
GFR calc non Af Amer: 68 mL/min/{1.73_m2} (ref >59)
GLUCOSE: 148 mg/dL — AB (ref 65–99)
POTASSIUM: 4.6 mmol/L (ref 3.5–5.2)
Sodium: 137 mmol/L (ref 134–144)

## 2016-07-10 LAB — MAGNESIUM: MAGNESIUM: 2.2 mg/dL (ref 1.6–2.3)

## 2016-07-11 DIAGNOSIS — M5136 Other intervertebral disc degeneration, lumbar region: Secondary | ICD-10-CM | POA: Diagnosis not present

## 2016-07-11 DIAGNOSIS — M9903 Segmental and somatic dysfunction of lumbar region: Secondary | ICD-10-CM | POA: Diagnosis not present

## 2016-07-12 DIAGNOSIS — M5136 Other intervertebral disc degeneration, lumbar region: Secondary | ICD-10-CM | POA: Diagnosis not present

## 2016-07-12 DIAGNOSIS — M9903 Segmental and somatic dysfunction of lumbar region: Secondary | ICD-10-CM | POA: Diagnosis not present

## 2016-07-12 LAB — HGB A1C W/O EAG: HEMOGLOBIN A1C: 7.1 % — AB (ref 4.8–5.6)

## 2016-07-12 LAB — SPECIMEN STATUS REPORT

## 2016-07-12 NOTE — Addendum Note (Signed)
Addended by: Cristopher Estimable on: 07/12/2016 11:34 AM   Modules accepted: Orders

## 2016-07-16 DIAGNOSIS — M9903 Segmental and somatic dysfunction of lumbar region: Secondary | ICD-10-CM | POA: Diagnosis not present

## 2016-07-16 DIAGNOSIS — M5136 Other intervertebral disc degeneration, lumbar region: Secondary | ICD-10-CM | POA: Diagnosis not present

## 2016-07-18 ENCOUNTER — Telehealth: Payer: Self-pay

## 2016-07-18 NOTE — Progress Notes (Signed)
Patient notified

## 2016-07-18 NOTE — Telephone Encounter (Signed)
Patient notified to continue with metformin 500 QD as before. Patient verbalized understanding

## 2016-07-18 NOTE — Progress Notes (Signed)
Continue current metformin dose of '500mg'$  qd

## 2016-07-19 DIAGNOSIS — M5136 Other intervertebral disc degeneration, lumbar region: Secondary | ICD-10-CM | POA: Diagnosis not present

## 2016-07-19 DIAGNOSIS — M9903 Segmental and somatic dysfunction of lumbar region: Secondary | ICD-10-CM | POA: Diagnosis not present

## 2016-07-23 DIAGNOSIS — Z79899 Other long term (current) drug therapy: Secondary | ICD-10-CM | POA: Diagnosis not present

## 2016-07-23 DIAGNOSIS — M0570 Rheumatoid arthritis with rheumatoid factor of unspecified site without organ or systems involvement: Secondary | ICD-10-CM | POA: Diagnosis not present

## 2016-07-23 DIAGNOSIS — M1712 Unilateral primary osteoarthritis, left knee: Secondary | ICD-10-CM | POA: Diagnosis not present

## 2016-07-23 DIAGNOSIS — M545 Low back pain: Secondary | ICD-10-CM | POA: Diagnosis not present

## 2016-08-01 ENCOUNTER — Telehealth: Payer: Self-pay | Admitting: Cardiology

## 2016-08-01 NOTE — Telephone Encounter (Signed)
Patient called back - Disregard note r/t prednisone-   Patient is requesting refill of Tikosyn which she gets delivered from manufacturer to office. Will route to pharmD to address.

## 2016-08-01 NOTE — Telephone Encounter (Signed)
No answer when dialed. Left msg for patient to call. Cannot confirm original prescriber of this medication, it is listed as historical med. Will see if Dr. Percival Spanish OK to refill.

## 2016-08-01 NOTE — Telephone Encounter (Signed)
New Message   *STAT* If patient is at the pharmacy, call can be transferred to refill team.   1. Which medications need to be refilled? (please list name of each medication and dose if known) Prednisone  2. Which pharmacy/location (including street and city if local pharmacy) is medication to be sent to? Pt voiced from Manufacturer  3. Do they need a 30 day or 90 day supply? 90 days  Please follow up with pt. Thanks!

## 2016-08-01 NOTE — Telephone Encounter (Signed)
Refill order placed - Confirmation # [id number]

## 2016-08-05 ENCOUNTER — Other Ambulatory Visit: Payer: Self-pay | Admitting: Family Medicine

## 2016-08-06 ENCOUNTER — Telehealth: Payer: Self-pay | Admitting: Pharmacist Clinician (PhC)/ Clinical Pharmacy Specialist

## 2016-08-06 NOTE — Telephone Encounter (Signed)
Patient assistance Tikosyn arrived.  Patient notified to pick up at front desk.  90 day supply

## 2016-08-07 ENCOUNTER — Ambulatory Visit (HOSPITAL_BASED_OUTPATIENT_CLINIC_OR_DEPARTMENT_OTHER): Payer: Medicare Other | Admitting: Oncology

## 2016-08-07 ENCOUNTER — Other Ambulatory Visit (HOSPITAL_BASED_OUTPATIENT_CLINIC_OR_DEPARTMENT_OTHER): Payer: Medicare Other

## 2016-08-07 VITALS — BP 179/88 | HR 60 | Temp 98.4°F | Resp 18 | Ht 68.0 in | Wt 179.1 lb

## 2016-08-07 DIAGNOSIS — Z79811 Long term (current) use of aromatase inhibitors: Secondary | ICD-10-CM

## 2016-08-07 DIAGNOSIS — Z170 Estrogen receptor positive status [ER+]: Secondary | ICD-10-CM

## 2016-08-07 DIAGNOSIS — C50411 Malignant neoplasm of upper-outer quadrant of right female breast: Secondary | ICD-10-CM | POA: Diagnosis not present

## 2016-08-07 DIAGNOSIS — M810 Age-related osteoporosis without current pathological fracture: Secondary | ICD-10-CM | POA: Diagnosis not present

## 2016-08-07 LAB — CBC WITH DIFFERENTIAL/PLATELET
BASO%: 0.5 % (ref 0.0–2.0)
Basophils Absolute: 0 10*3/uL (ref 0.0–0.1)
EOS%: 0.5 % (ref 0.0–7.0)
Eosinophils Absolute: 0 10*3/uL (ref 0.0–0.5)
HCT: 39.6 % (ref 34.8–46.6)
HGB: 12.8 g/dL (ref 11.6–15.9)
LYMPH%: 17.1 % (ref 14.0–49.7)
MCH: 27.4 pg (ref 25.1–34.0)
MCHC: 32.3 g/dL (ref 31.5–36.0)
MCV: 84.8 fL (ref 79.5–101.0)
MONO#: 0.4 10*3/uL (ref 0.1–0.9)
MONO%: 6.6 % (ref 0.0–14.0)
NEUT#: 4.6 10*3/uL (ref 1.5–6.5)
NEUT%: 75.3 % (ref 38.4–76.8)
PLATELETS: 200 10*3/uL (ref 145–400)
RBC: 4.67 10*6/uL (ref 3.70–5.45)
RDW: 19.6 % — ABNORMAL HIGH (ref 11.2–14.5)
WBC: 6.2 10*3/uL (ref 3.9–10.3)
lymph#: 1.1 10*3/uL (ref 0.9–3.3)

## 2016-08-07 LAB — COMPREHENSIVE METABOLIC PANEL
ALT: 17 U/L (ref 0–55)
ANION GAP: 8 meq/L (ref 3–11)
AST: 20 U/L (ref 5–34)
Albumin: 3.3 g/dL — ABNORMAL LOW (ref 3.5–5.0)
Alkaline Phosphatase: 102 U/L (ref 40–150)
BUN: 15.2 mg/dL (ref 7.0–26.0)
CHLORIDE: 106 meq/L (ref 98–109)
CO2: 25 meq/L (ref 22–29)
Calcium: 10.3 mg/dL (ref 8.4–10.4)
Creatinine: 0.8 mg/dL (ref 0.6–1.1)
EGFR: 72 mL/min/{1.73_m2} — AB (ref >90)
Glucose: 122 mg/dl (ref 70–140)
Potassium: 4.6 mEq/L (ref 3.5–5.1)
Sodium: 139 mEq/L (ref 136–145)
Total Bilirubin: 0.35 mg/dL (ref 0.20–1.20)
Total Protein: 7.8 g/dL (ref 6.4–8.3)

## 2016-08-07 NOTE — Progress Notes (Signed)
[place]  [phone] [phone]     ID: [name] OB: 05-27-1946  MR#: [id number]  ZWC#:[id number]  PCP: Kenn File, MD GYN:   SU: Excell Seltzer OTHER MD: [name], Arley Phenix, Christene Slates, Evelena Peat truslow  CHIEF COMPLAINT: Estrogen receptor positive breast cancer   CURRENT TREATMENT: Anastrozole  BREAST CANCER HISTORY: From the original intake note:   "Melody Casey" had routine breast mammography at [facility] 01/14/2014. The breast density was category B. A new cluster of heterogeneous calcifications were noted in the right breast and on 01/28/2014 the patient underwent stereotactic biopsy of this area with the pathology ([id number]) showing an invasive ductal carcinoma, grade 1, estrogen receptor 100% positive, with strong staining intensity; progesterone receptor 63% positive, with moderate staining intensity, with an MIB-1 of 26% and no HER-2 amplification, the ratio being 1.44 and the number per cell 2.80.  On 02/01/2014 the patient underwent bilateral MRI of the breast, with no enhancement associated with the previously biopsied area. However there was a separate 7 mm enhancing mass in the lower inner quadrant and biopsy of this mass 02/03/2014 showed benign breast parenchyma, with no atypia. Review of this pathology showed atypical ductal hyperplasia, and therefore excisional biopsy was recommended, performed 02/10/2014 and showing only atypical ductal hyperplasia, with adenosis and calcifications ([id number]).  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 Tumor.   Her subsequent history is as detailed below  INTERVAL HISTORY: Melody Casey returns today for follow up of her breast cancer. She has been on anastrozole a little over 2 years, with good tolerance. Hot  flashes and vaginal dryness are not a major issue. She never developed the arthralgias or myalgias that many patients can experience on this medication. She obtains it at a good price.  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 She is not exercising. Her rheumatoid arthritis is well-controlled on minimal doses of prednisone. Her rheumatologist has suggested methotrexate and she is very concerned about this. She complains of hearing loss. She tells me her left knee is ready for a replacement but she is not intending to have it. She says her diabetes is well-controlled. A detailed review of systems today was otherwise stable  PAST MEDICAL HISTORY: Past Medical History:  Diagnosis Date  . AAA (abdominal aortic aneurysm) ([place])   . Anxiety   . Arthritis   . Atrial fibrillation ([place])    a. Dx 03/2011 - on tikosyn/coumadin.  Marland Kitchen CAD (coronary artery disease)    a. NSTEMI 02/2011: occ mid Cx, DES to OM2, residual nonobst LAD dz.  . Cancer ([place])    breast  . Diabetes mellitus   . Diverticulitis   . Diverticulitis of colon     2008, 04/2011, 12/2014, 08/2015  . Foot deformity    right  . Hemangioma    liver  . HLD (hyperlipidemia)   . HTN (hypertension)   . Osteoporosis   . Tobacco abuse    stopped smoking 2012  . Ureteral obstruction    History of gross hematuria/right hydronephrosis 2/2 to uteropelvic junction obstruction, s/p cystoscopy in January 2007 with bilateral retrograde pyelography, right ureter arthroscopy, right ureteral stent placement, bladder biopsies, stent removal since then.  . Wears dentures    top    PAST SURGICAL HISTORY: Past Surgical History:  Procedure  Laterality Date  . BIOPSY N/A 11/01/2015   Procedure: BIOPSY;  Surgeon: Danie Binder, MD;  Location: [facility];  Service: Endoscopy;  Laterality: N/A;  . BREAST LUMPECTOMY WITH NEEDLE LOCALIZATION AND AXILLARY SENTINEL LYMPH NODE BX Right 03/01/2014   Procedure: BREAST LUMPECTOMY WITH  NEEDLE LOCALIZATION AND AXILLARY SENTINEL LYMPH NODE BX;  Surgeon: Edward Jolly, MD;  Location: [facility];  Service: General;  Laterality: Right;  . CARDIAC CATHETERIZATION  2012   stent  . COLONOSCOPY WITH PROPOFOL N/A 06/29/2014   Dr. Wynetta Emery: universal diverticulosis  . CORONARY STENT PLACEMENT  2012  . ESOPHAGOGASTRODUODENOSCOPY (EGD) WITH PROPOFOL N/A 11/01/2015   Procedure: ESOPHAGOGASTRODUODENOSCOPY (EGD) WITH PROPOFOL;  Surgeon: Danie Binder, MD;  Location: [facility];  Service: Endoscopy;  Laterality: N/A;  . KNEE ARTHROSCOPY     left  . Right leg surgery  age 65   As a child for  ?scleroderma per patient  . TONSILLECTOMY AND ADENOIDECTOMY    . TUBAL LIGATION    . Ureteral surgery  2011   rt ureterostomy-stent    FAMILY HISTORY Family History  Problem Relation Age of Onset  . Lung cancer Father 43    deceased  . Cancer Father   . Heart failure Mother 67    deceased  . Heart disease Mother   . Diabetes Brother   . Colon cancer Neg Hx   . Liver disease Neg Hx   The patient's father died at the age of 54 with a history of lung cancer. He was a smoker and also had asbestos exposure. The patient's mother died at the age of 37. The patient had one brother. There is no other history of cancer in the family to her knowledge  GYNECOLOGIC HISTORY:  Menarche age 46, first live birth age 18. The patient stopped having periods approximately 1997. She did not take hormone replacement.   SOCIAL HISTORY:  Melody Casey works part time for [employer]. Her husband of 20+ years, [name] "[name]" Pisarski is a retired Futures trader. He has a history of head and neck cancer treated at [facility] years ago. Daughter Oliva Montecalvo lives in [name] and is a Scientist, clinical (histocompatibility and immunogenetics). Son [name] lives in [name] and he is an Conservator, museum/gallery. The patient has 7 grandchildren and 2 great-grandchildren. She attends [employer]     ADVANCED DIRECTIVES:  in place    HEALTH MAINTENANCE: Social History  Substance Use Topics  . Smoking status: Former Smoker    Packs/day: 2.00    Years: 50.00    Types: Cigarettes    Quit date: 02/08/2011  . Smokeless tobacco: Never Used  . Alcohol use No     Colonoscopy: never   PAP: 2013  Bone density:Never  Lipid panel:  Allergies  Allergen Reactions  . Miralax [Polyethylene Glycol] Swelling and Rash    Took [facility] brand developed rash. Patient states she tolerated name brand MiraLax in the past.  09/01/15. Patient states she had diffuse swelling including swelling of her lips.   . Vancomycin Rash and Shortness Of Breath  . Sulfa Antibiotics Rash    All over rash  . Sulfacetamide Sodium Rash    All over rash  . Acetaminophen Hives  . Banana Other (See Comments)    Unknown  . Oxycodone-Acetaminophen Itching  . Penicillins Other (See Comments)    Pt had so much as a child she's immune to it  . Latex  Rash  . Tape Rash    Current Outpatient Prescriptions  Medication Sig Dispense Refill  . ALPRAZolam (XANAX) 0.25 MG tablet Take 0.25 mg by mouth daily as needed for anxiety.     Marland Kitchen amLODipine (NORVASC) 2.5 MG tablet TAKE ONE TABLET BY MOUTH IN THE MORNING 30 tablet 8  . anastrozole (ARIMIDEX) 1 MG tablet Take 1 tablet (1 mg total) by mouth daily. 90 tablet 3  . atorvastatin (LIPITOR) 40 MG tablet Take 60 mg by mouth daily. Take 1 1/2 tablets (60 MG) daily.    . calcium-vitamin D (OSCAL WITH D) 500-200 MG-UNIT per tablet Take 1 tablet by mouth 2 (two) times daily.    Marland Kitchen dofetilide (TIKOSYN) 250 MCG capsule Take 1 capsule (250 mcg total) by mouth 2 (two) times daily. 60 capsule 11  . lisinopril (PRINIVIL,ZESTRIL) 40 MG tablet TAKE ONE TABLET BY MOUTH IN THE MORNING 90 tablet 0  . magnesium oxide (MAG-OX) 400 MG tablet Take 400 mg twice a day with food 60 tablet 6  . metFORMIN (GLUCOPHAGE) 500 MG tablet TAKE ONE TABLET BY MOUTH ONCE DAILY 90 tablet 0  .  nitroGLYCERIN (NITROSTAT) 0.4 MG SL tablet Place 1 tablet (0.4 mg total) under the tongue every 5 (five) minutes as needed for chest pain. 25 tablet 5  . ondansetron (ZOFRAN) 4 MG tablet Take 1 tablet (4 mg total) by mouth every 6 (six) hours as needed for nausea. (Patient not taking: Reported on 07/09/2016) 20 tablet 0  . potassium chloride SA (K-DUR,KLOR-CON) 20 MEQ tablet Take 1 tablet (20 mEq total) by mouth 2 (two) times daily. 180 tablet 3  . predniSONE (DELTASONE) 10 MG tablet Take 10-20 mg by mouth daily.    . traMADol (ULTRAM) 50 MG tablet Take 1-2 tablets (50-100 mg total) by mouth every 8 (eight) hours as needed. 60 tablet 0  . warfarin (COUMADIN) 5 MG tablet Take 1-1.5 tablets (5-7.5 mg total) by mouth daily. (Patient taking differently: Take 5 mg by mouth daily. ) 102 tablet 3   No current facility-administered medications for this visit.     OBJECTIVE: Older white woman In no acute distress Vitals:   08/07/16 1148  BP: (!) 179/88  Pulse: 60  Resp: 18  Temp: 98.4 F (36.9 C)     Body mass index is 27.23 kg/m.    ECOG FS:1 - Symptomatic but completely ambulatory   Sclerae unicteric, EOMs intact Oropharynx clear and moist No cervical or supraclavicular adenopathy Lungs no rales or rhonchi Heart regular rate and rhythm Abd soft, nontender, positive bowel sounds MSK no focal spinal tenderness, no upper extremity lymphedema Neuro: nonfocal, well oriented, appropriate affect Breasts: The right breast is status post lumpectomy (no radiation). There is no evidence of local recurrence. The right axilla is benign. Left breast is unremarkable.   LAB RESULTS:  CMP     Component Value Date/Time   NA 137 07/09/2016 0948   NA 135 (L) 01/04/2016 0946   K 4.6 07/09/2016 0948   K 4.9 01/04/2016 0946   CL 101 07/09/2016 0948   CO2 25 07/09/2016 0948   CO2 24 01/04/2016 0946   GLUCOSE 148 (H) 07/09/2016 0948   GLUCOSE 112 (H) 04/09/2016 2040   GLUCOSE 195 (H) 01/04/2016 0946    BUN 21 07/09/2016 0948   BUN 23.8 01/04/2016 0946   CREATININE 0.86 07/09/2016 0948   CREATININE 1.0 01/04/2016 0946   CALCIUM 10.3 07/09/2016 0948   CALCIUM 9.6 01/04/2016 0946   PROT 7.2 03/26/2016  1001   PROT 7.6 01/04/2016 0946   ALBUMIN 3.6 03/26/2016 1001   ALBUMIN 3.1 (L) 01/04/2016 0946   AST 19 03/26/2016 1001   AST 17 01/04/2016 0946   ALT 12 03/26/2016 1001   ALT 21 01/04/2016 0946   ALKPHOS 100 03/26/2016 1001   ALKPHOS 105 01/04/2016 0946   BILITOT 0.4 03/26/2016 1001   BILITOT 0.38 01/04/2016 0946   GFRNONAA 68 07/09/2016 0948   GFRNONAA 80 06/22/2015 1614   GFRAA 78 07/09/2016 0948   GFRAA >89 06/22/2015 1614    I No results found for: SPEP  Lab Results  Component Value Date   WBC 6.2 08/07/2016   NEUTROABS 4.6 08/07/2016   HGB 12.8 08/07/2016   HCT 39.6 08/07/2016   MCV 84.8 08/07/2016   PLT 200 08/07/2016      Chemistry      Component Value Date/Time   NA 137 07/09/2016 0948   NA 135 (L) 01/04/2016 0946   K 4.6 07/09/2016 0948   K 4.9 01/04/2016 0946   CL 101 07/09/2016 0948   CO2 25 07/09/2016 0948   CO2 24 01/04/2016 0946   BUN 21 07/09/2016 0948   BUN 23.8 01/04/2016 0946   CREATININE 0.86 07/09/2016 0948   CREATININE 1.0 01/04/2016 0946      Component Value Date/Time   CALCIUM 10.3 07/09/2016 0948   CALCIUM 9.6 01/04/2016 0946   ALKPHOS 100 03/26/2016 1001   ALKPHOS 105 01/04/2016 0946   AST 19 03/26/2016 1001   AST 17 01/04/2016 0946   ALT 12 03/26/2016 1001   ALT 21 01/04/2016 0946   BILITOT 0.4 03/26/2016 1001   BILITOT 0.38 01/04/2016 0946       No results found for: LABCA2  No components found for: [id number]  No results for input(s): INR in the last 168 hours.  Urinalysis    Component Value Date/Time   COLORURINE YELLOW 09/01/2015 1800   APPEARANCEUR Cloudy (A) 03/21/2016 1344   LABSPEC 1.025 09/01/2015 1800   PHURINE 6.0 09/01/2015 1800   GLUCOSEU Negative 03/21/2016 1344   GLUCOSEU NEGATIVE 04/05/2008 0833    HGBUR SMALL (A) 09/01/2015 1800   BILIRUBINUR Negative 03/21/2016 1344   KETONESUR NEGATIVE 09/01/2015 1800   PROTEINUR Negative 03/21/2016 1344   PROTEINUR TRACE (A) 09/01/2015 1800   UROBILINOGEN negative 10/25/2015 0926   UROBILINOGEN 0.2 09/01/2015 1800   NITRITE Negative 03/21/2016 1344   NITRITE NEGATIVE 09/01/2015 1800   LEUKOCYTESUR 3+ (A) 03/21/2016 1344    STUDIES: CLINICAL DATA:  Abdominal pain and diarrhea. Periumbilical pain. Weakness beginning 2 days ago. Diarrhea and decreased appetite.  EXAM: CT ABDOMEN AND PELVIS WITH CONTRAST  TECHNIQUE: Multidetector CT imaging of the abdomen and pelvis was performed using the standard protocol following bolus administration of intravenous contrast.  CONTRAST:  141m ISOVUE-300 IOPAMIDOL (ISOVUE-300) INJECTION 61%  COMPARISON:  08/30/2015  FINDINGS: Slight fibrosis in the lung bases.  Coronary artery calcifications.  Focal low-attenuation lesion with peripheral enhancement in the right lobe of liver inferiorly, measuring 2.7 cm. Appearance is consistent with a hemangioma. The gallbladder, pancreas, spleen, adrenal glands, inferior vena cava, and retroperitoneal lymph nodes are unremarkable. Focal dilatation of the abdominal aorta at 3.3 cm diameter. Bilateral iliac artery aneurysms at 1.7 cm on the right and 1.6 cm on the left. Calcifications in both kidneys likely representing vascular calcification. Stone in the lower pole of the right kidney. Measures 3 mm diameter. Right-sided hydronephrosis with decompressed ureter. No ureteral stones are demonstrated. Appearance suggests UPJ obstruction.  Small cysts in both kidneys. No hydronephrosis on the left.  The stomach, small bowel, and colon are not abnormally distended. Scattered diverticula in the colon. Scattered stool in the colon. No free air or free fluid in the abdomen.  Pelvis: The appendix is normal. Bladder wall is not thickened. Multiple  calcifications in the uterus consistent with fibroids. No abnormal adnexal masses. Diverticulosis of the sigmoid colon without evidence of diverticulitis. Muscular hypertrophy is present, decreased since previous study. No free or loculated pelvic fluid collections. No pelvic mass or lymphadenopathy. Degenerative changes and scoliosis in the lumbar spine. No destructive bone lesions.  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 3.3 cm abdominal aortic aneurysm with 1.7 cm right iliac and 1.6 cm left iliac aneurysms. Nonobstructing stone in the lower pole right kidney. Right-sided hydronephrosis suggesting ureteropelvic junction obstruction. Bilateral renal cysts. Diverticulosis of the colon without evidence of diverticulitis. Uterine fibroids.   Electronically Signed   By: Lucienne Capers M.D.   On: 04/10/2016 01:31   ASSESSMENT: 70 y.o. [place] woman status post right breast upper outer quadrant biopsy  01/28/2014 for a clinical T1b N0, stage IA invasive ductal carcinoma, grade 1, estrogen and progesterone receptor positive, HER-2 not amplified, with an MIB-1 of 26%  (1) Biopsy of 2 additional suspicious areas in the right breast 02/03/2014 and 02/10/2014 showed only atypical ductal hyperplasia  (2) status post right lumpectomy and sentinel lymph node sampling 03/01/2014 showed an additional 3 mm area of invasive ductal carcinoma, grade 1, with negative margins. The single sentinel lymph node was negative.  (3) Opted against adjuvant radiation given the marginal benefit of local recurrence  (4) started anastrozole 03/29/2014, interrupted May 2016 with joint swelling; restarted 06/20/15  (a) osteoporosis with T score -3.5 on bone density scan 04/11/2015  PLAN: Melody Casey is now a little over 2 years out from definitive surgery for her breast cancer with no evidence of disease recurrence. This is very favorable.  The plan is to continue anastrozole for a  total of 5 years.  We had a long discussion today regarding methotrexate, which Dr. Alessandra Grout low suggested. She was moved because this was a "chemotherapy agent". I explained she would be getting approximately 1/10 the doses that I normally use an approximately 1% of the high doses we used sometimes for lymphoma. We discussed the possibility of liver damage and interstitial lung fibrosis, which means that she would need to be monitored by Dr. Alessandra Grout low, as he is planning to do.  By the end of the dictation she seemed to be a little bit more agreeable to methotrexate. She will discuss that with her rheumatologist in October.  She will see me June of next year, after her next set of mammograms. She knows to call for any problems that may develop before that visit.    Chauncey Cruel, MD   08/07/2016 11:54 AM

## 2016-08-09 ENCOUNTER — Ambulatory Visit: Payer: Medicare Other | Admitting: Family Medicine

## 2016-08-09 ENCOUNTER — Ambulatory Visit (INDEPENDENT_AMBULATORY_CARE_PROVIDER_SITE_OTHER): Payer: Medicare Other | Admitting: Pharmacist

## 2016-08-09 DIAGNOSIS — I481 Persistent atrial fibrillation: Secondary | ICD-10-CM

## 2016-08-09 DIAGNOSIS — Z79899 Other long term (current) drug therapy: Secondary | ICD-10-CM

## 2016-08-09 DIAGNOSIS — I714 Abdominal aortic aneurysm, without rupture, unspecified: Secondary | ICD-10-CM

## 2016-08-09 DIAGNOSIS — I4819 Other persistent atrial fibrillation: Secondary | ICD-10-CM

## 2016-08-09 LAB — COAGUCHEK XS/INR WAIVED
INR: 2 — ABNORMAL HIGH (ref 0.9–1.1)
Prothrombin Time: 24 s

## 2016-08-14 ENCOUNTER — Encounter: Payer: Self-pay | Admitting: Family Medicine

## 2016-08-27 ENCOUNTER — Other Ambulatory Visit: Payer: Self-pay | Admitting: Cardiology

## 2016-08-28 NOTE — Telephone Encounter (Signed)
Rx has been sent to the pharmacy electronically. ° °

## 2016-08-29 DIAGNOSIS — M1712 Unilateral primary osteoarthritis, left knee: Secondary | ICD-10-CM | POA: Diagnosis not present

## 2016-09-11 DIAGNOSIS — M1712 Unilateral primary osteoarthritis, left knee: Secondary | ICD-10-CM | POA: Diagnosis not present

## 2016-09-19 DIAGNOSIS — M1712 Unilateral primary osteoarthritis, left knee: Secondary | ICD-10-CM | POA: Diagnosis not present

## 2016-09-21 ENCOUNTER — Ambulatory Visit: Payer: Self-pay | Admitting: Family Medicine

## 2016-09-27 ENCOUNTER — Telehealth: Payer: Self-pay | Admitting: Pharmacist

## 2016-09-27 DIAGNOSIS — M1712 Unilateral primary osteoarthritis, left knee: Secondary | ICD-10-CM | POA: Diagnosis not present

## 2016-09-27 NOTE — Telephone Encounter (Signed)
Patient missed appt with Dr Wendi Snipes for follow up and INR.  SHe doesn't have INR appt until 10/29/16.  Need to either reschedule appt with Dr Wendi Snipes or move protime appt sooner.  Called patient and left message to call office to make appt.

## 2016-09-28 DIAGNOSIS — E1151 Type 2 diabetes mellitus with diabetic peripheral angiopathy without gangrene: Secondary | ICD-10-CM | POA: Diagnosis not present

## 2016-09-28 DIAGNOSIS — L84 Corns and callosities: Secondary | ICD-10-CM | POA: Diagnosis not present

## 2016-09-28 DIAGNOSIS — I739 Peripheral vascular disease, unspecified: Secondary | ICD-10-CM | POA: Diagnosis not present

## 2016-09-28 DIAGNOSIS — L603 Nail dystrophy: Secondary | ICD-10-CM | POA: Diagnosis not present

## 2016-10-01 ENCOUNTER — Ambulatory Visit (INDEPENDENT_AMBULATORY_CARE_PROVIDER_SITE_OTHER): Payer: Medicare Other | Admitting: Pharmacist

## 2016-10-01 DIAGNOSIS — I714 Abdominal aortic aneurysm, without rupture, unspecified: Secondary | ICD-10-CM

## 2016-10-01 DIAGNOSIS — I4819 Other persistent atrial fibrillation: Secondary | ICD-10-CM

## 2016-10-01 DIAGNOSIS — Z79899 Other long term (current) drug therapy: Secondary | ICD-10-CM

## 2016-10-01 DIAGNOSIS — I481 Persistent atrial fibrillation: Secondary | ICD-10-CM | POA: Diagnosis not present

## 2016-10-01 LAB — COAGUCHEK XS/INR WAIVED
INR: 2.2 — ABNORMAL HIGH (ref 0.9–1.1)
PROTHROMBIN TIME: 25.8 s

## 2016-10-01 MED ORDER — WARFARIN SODIUM 5 MG PO TABS: ORAL_TABLET | ORAL | 0 refills | Status: DC

## 2016-10-04 DIAGNOSIS — M1712 Unilateral primary osteoarthritis, left knee: Secondary | ICD-10-CM | POA: Diagnosis not present

## 2016-10-11 DIAGNOSIS — M1712 Unilateral primary osteoarthritis, left knee: Secondary | ICD-10-CM | POA: Diagnosis not present

## 2016-10-18 DIAGNOSIS — M25441 Effusion, right hand: Secondary | ICD-10-CM | POA: Diagnosis not present

## 2016-10-18 DIAGNOSIS — M25442 Effusion, left hand: Secondary | ICD-10-CM | POA: Diagnosis not present

## 2016-10-18 DIAGNOSIS — M0570 Rheumatoid arthritis with rheumatoid factor of unspecified site without organ or systems involvement: Secondary | ICD-10-CM | POA: Diagnosis not present

## 2016-10-18 DIAGNOSIS — M25462 Effusion, left knee: Secondary | ICD-10-CM | POA: Diagnosis not present

## 2016-10-25 ENCOUNTER — Other Ambulatory Visit: Payer: Self-pay | Admitting: Family Medicine

## 2016-10-25 NOTE — Telephone Encounter (Signed)
Pt aware.

## 2016-10-25 NOTE — Telephone Encounter (Signed)
Needs to be seen for refills.   Melody Apple, MD [facility] 10/25/2016, 11:48 AM

## 2016-10-26 ENCOUNTER — Encounter: Payer: Self-pay | Admitting: Family

## 2016-10-26 ENCOUNTER — Ambulatory Visit (INDEPENDENT_AMBULATORY_CARE_PROVIDER_SITE_OTHER): Payer: Medicare Other | Admitting: Family

## 2016-10-26 VITALS — BP 124/72 | HR 68 | Temp 97.7°F | Ht 68.0 in | Wt 176.0 lb

## 2016-10-26 DIAGNOSIS — I481 Persistent atrial fibrillation: Secondary | ICD-10-CM | POA: Diagnosis not present

## 2016-10-26 DIAGNOSIS — Z79899 Other long term (current) drug therapy: Secondary | ICD-10-CM | POA: Diagnosis not present

## 2016-10-26 DIAGNOSIS — I714 Abdominal aortic aneurysm, without rupture, unspecified: Secondary | ICD-10-CM

## 2016-10-26 DIAGNOSIS — K5792 Diverticulitis of intestine, part unspecified, without perforation or abscess without bleeding: Secondary | ICD-10-CM

## 2016-10-26 DIAGNOSIS — I4819 Other persistent atrial fibrillation: Secondary | ICD-10-CM

## 2016-10-26 MED ORDER — METRONIDAZOLE 500 MG PO TABS: 500.0000 mg | ORAL_TABLET | Freq: Three times a day (TID) | ORAL | 0 refills | Status: DC

## 2016-10-26 MED ORDER — CIPROFLOXACIN HCL 500 MG PO TABS: 500.0000 mg | ORAL_TABLET | Freq: Two times a day (BID) | ORAL | 0 refills | Status: DC

## 2016-10-26 NOTE — Progress Notes (Signed)
   Subjective:    Patient ID: Melody Casey, female    DOB: 10/23/1946, 70 y.o.   MRN: [id number]  PT presents to the office today with LLQ pain. PT states she had some "left over" cipro and flagy and she started on Tuesday for diverticulitis. PT states this pain is the same as all of her other "flare ups" and needs a refill on her antibiotics because she only had a few days worth. PT would also like to have her INR checked today for A Fib that is stable.  Abdominal Pain  This is a new problem. The current episode started in the past 7 days. The onset quality is gradual. The problem occurs intermittently. The problem has been waxing and waning. The pain is located in the LLQ. The pain is at a severity of 4/10. The pain is moderate. The quality of the pain is sharp. The abdominal pain does not radiate. Associated symptoms include constipation. Pertinent negatives include no belching, diarrhea, flatus, hematuria, nausea or vomiting. Nothing aggravates the pain. The pain is relieved by being still. Treatments tried: cipro and flagyl started three days ago. The treatment provided mild relief.      Review of Systems  Gastrointestinal: Positive for abdominal pain and constipation. Negative for diarrhea, flatus, nausea and vomiting.  Genitourinary: Negative for hematuria.  All other systems reviewed and are negative.      Objective:   Physical Exam  Constitutional: She is oriented to person, place, and time. She appears well-developed and well-nourished. No distress.  HENT:  Head: Normocephalic.  Eyes: Pupils are equal, round, and reactive to light.  Neck: Normal range of motion. Neck supple. No thyromegaly present.  Cardiovascular: Normal rate, regular rhythm, normal heart sounds and intact distal pulses.   No murmur heard. Pulmonary/Chest: Effort normal and breath sounds normal. No respiratory distress. She has no wheezes.  Abdominal: Soft. Bowel sounds are normal. She exhibits no distension.  There is tenderness (LLQ pain).  Musculoskeletal: Normal range of motion. She exhibits no edema or tenderness.  Neurological: She is alert and oriented to person, place, and time.  Skin: Skin is warm and dry.  Psychiatric: She has a normal mood and affect. Her behavior is normal. Judgment and thought content normal.  Vitals reviewed.     BP 124/72   Pulse 68   Temp 97.7 F (36.5 C) (Oral)   Ht '5\' 8"'$  (1.727 m)   Wt 176 lb (79.8 kg)   BMI 26.76 kg/m      Assessment & Plan:  1. Acute diverticulitis -Force fluids -Take antibiotics as prescribed - ciprofloxacin (CIPRO) 500 MG tablet; Take 1 tablet (500 mg total) by mouth 2 (two) times daily.  Dispense: 14 tablet; Refill: 0 - metroNIDAZOLE (FLAGYL) 500 MG tablet; Take 1 tablet (500 mg total) by mouth 3 (three) times daily.  Dispense: 21 tablet; Refill: 0  2. Persistent atrial fibrillation (HCC) - CoaguChek XS/INR Waived  3. High risk medication use - CoaguChek XS/INR Waived  4. AAA (abdominal aortic aneurysm) without rupture (HCC) - CoaguChek XS/INR Waived  Anticoagulation Dose Instructions as of 10/26/2016      Dorene Grebe Tue Wed Thu Fri Sat   New Dose 5 mg 5 mg 5 mg 5 mg 5 mg 5 mg 5 mg    Description   Warfarin '5mg'$  dose every day. Hold today's dose then continue 1 tab (5 mg) daily      Evelina Dun, FNP

## 2016-10-26 NOTE — Patient Instructions (Addendum)

## 2016-10-29 ENCOUNTER — Telehealth: Payer: Self-pay | Admitting: Pharmacist Clinician (PhC)/ Clinical Pharmacy Specialist

## 2016-10-29 ENCOUNTER — Encounter: Payer: Self-pay | Admitting: Pharmacist

## 2016-10-29 LAB — COAGUCHEK XS/INR WAIVED
INR: 3.5 — ABNORMAL HIGH (ref 0.9–1.1)
Prothrombin Time: 41.6 s

## 2016-10-29 NOTE — Telephone Encounter (Signed)
Order placed. Order confirmation number: [id number].   Will notify pt when ready for pick up.

## 2016-10-29 NOTE — Telephone Encounter (Signed)
New message       Pt is calling to have pharmacist order her tikosyn from the manufacturer.

## 2016-11-06 ENCOUNTER — Telehealth: Payer: Self-pay | Admitting: Pharmacist Clinician (PhC)/ Clinical Pharmacy Specialist

## 2016-11-06 NOTE — Telephone Encounter (Signed)
Tikosyn arrived, placed at front desk for patient pick-up  Patient notified

## 2016-11-11 ENCOUNTER — Other Ambulatory Visit: Payer: Self-pay | Admitting: Family Medicine

## 2016-11-13 ENCOUNTER — Ambulatory Visit (INDEPENDENT_AMBULATORY_CARE_PROVIDER_SITE_OTHER): Payer: Medicare Other | Admitting: Family

## 2016-11-13 ENCOUNTER — Encounter: Payer: Self-pay | Admitting: Family

## 2016-11-13 ENCOUNTER — Encounter: Payer: Self-pay | Admitting: Pharmacist

## 2016-11-13 VITALS — BP 140/82 | HR 67 | Temp 97.0°F | Ht 68.0 in | Wt 179.2 lb

## 2016-11-13 DIAGNOSIS — M05741 Rheumatoid arthritis with rheumatoid factor of right hand without organ or systems involvement: Secondary | ICD-10-CM | POA: Diagnosis not present

## 2016-11-13 DIAGNOSIS — I714 Abdominal aortic aneurysm, without rupture, unspecified: Secondary | ICD-10-CM

## 2016-11-13 DIAGNOSIS — I10 Essential (primary) hypertension: Secondary | ICD-10-CM | POA: Diagnosis not present

## 2016-11-13 DIAGNOSIS — E785 Hyperlipidemia, unspecified: Secondary | ICD-10-CM

## 2016-11-13 DIAGNOSIS — Z79899 Other long term (current) drug therapy: Secondary | ICD-10-CM | POA: Diagnosis not present

## 2016-11-13 DIAGNOSIS — F32A Depression, unspecified: Secondary | ICD-10-CM

## 2016-11-13 DIAGNOSIS — E119 Type 2 diabetes mellitus without complications: Secondary | ICD-10-CM | POA: Diagnosis not present

## 2016-11-13 DIAGNOSIS — I481 Persistent atrial fibrillation: Secondary | ICD-10-CM

## 2016-11-13 DIAGNOSIS — I4819 Other persistent atrial fibrillation: Secondary | ICD-10-CM

## 2016-11-13 DIAGNOSIS — F418 Other specified anxiety disorders: Secondary | ICD-10-CM

## 2016-11-13 DIAGNOSIS — F419 Anxiety disorder, unspecified: Secondary | ICD-10-CM

## 2016-11-13 DIAGNOSIS — D509 Iron deficiency anemia, unspecified: Secondary | ICD-10-CM | POA: Diagnosis not present

## 2016-11-13 DIAGNOSIS — M05742 Rheumatoid arthritis with rheumatoid factor of left hand without organ or systems involvement: Secondary | ICD-10-CM

## 2016-11-13 DIAGNOSIS — I2510 Atherosclerotic heart disease of native coronary artery without angina pectoris: Secondary | ICD-10-CM | POA: Diagnosis not present

## 2016-11-13 DIAGNOSIS — F329 Major depressive disorder, single episode, unspecified: Secondary | ICD-10-CM

## 2016-11-13 LAB — CMP14+EGFR
A/G RATIO: 1.1 — AB (ref 1.2–2.2)
ALBUMIN: 3.7 g/dL (ref 3.5–4.8)
ALT: 22 IU/L (ref 0–32)
AST: 16 IU/L (ref 0–40)
Alkaline Phosphatase: 95 IU/L (ref 39–117)
BILIRUBIN TOTAL: 0.4 mg/dL (ref 0.0–1.2)
BUN / CREAT RATIO: 17 (ref 12–28)
BUN: 14 mg/dL (ref 8–27)
CALCIUM: 10.2 mg/dL (ref 8.7–10.3)
CHLORIDE: 100 mmol/L (ref 96–106)
CO2: 24 mmol/L (ref 18–29)
Creatinine, Ser: 0.81 mg/dL (ref 0.57–1.00)
GFR, EST AFRICAN AMERICAN: 84 mL/min/{1.73_m2} (ref >59)
GFR, EST NON AFRICAN AMERICAN: 73 mL/min/{1.73_m2} (ref >59)
Globulin, Total: 3.4 g/dL (ref 1.5–4.5)
Glucose: 218 mg/dL — ABNORMAL HIGH (ref 65–99)
POTASSIUM: 5.7 mmol/L — AB (ref 3.5–5.2)
Sodium: 138 mmol/L (ref 134–144)
TOTAL PROTEIN: 7.1 g/dL (ref 6.0–8.5)

## 2016-11-13 LAB — LIPID PANEL
CHOL/HDL RATIO: 2.9 ratio (ref 0.0–4.4)
Cholesterol, Total: 197 mg/dL (ref 100–199)
HDL: 69 mg/dL (ref >39)
LDL CALC: 96 mg/dL (ref 0–99)
TRIGLYCERIDES: 158 mg/dL — AB (ref 0–149)
VLDL Cholesterol Cal: 32 mg/dL (ref 5–40)

## 2016-11-13 LAB — COAGUCHEK XS/INR WAIVED
INR: 2.1 — AB (ref 0.9–1.1)
Prothrombin Time: 25.1 s

## 2016-11-13 LAB — BAYER DCA HB A1C WAIVED: HB A1C (BAYER DCA - WAIVED): 7.4 % — ABNORMAL HIGH (ref <7.0)

## 2016-11-13 NOTE — Progress Notes (Signed)
Subjective:    Patient ID: Melody Casey, female    DOB: 1946/03/31, 70 y.o.   MRN: [id number]  PT presents to the office today for chronic follow up. PT is followed by Cardiologists annually for CAD. Followed by Rheumatologists every 1-2 months for rheumatoid arthritis.  Hypertension  This is a chronic problem. The current episode started more than 1 year ago. The problem is controlled. Associated symptoms include anxiety. Pertinent negatives include no malaise/fatigue, palpitations, peripheral edema or shortness of breath. Risk factors for coronary artery disease include dyslipidemia, diabetes mellitus, obesity and sedentary lifestyle. Past treatments include ACE inhibitors and calcium channel blockers. The current treatment provides moderate improvement. Hypertensive end-organ damage includes CAD/MI. There is no history of CVA or heart failure. There is no history of sleep apnea.  Hyperlipidemia  This is a chronic problem. The current episode started more than 1 year ago. The problem is controlled. Recent lipid tests were reviewed and are normal. Exacerbating diseases include obesity. Pertinent negatives include no shortness of breath. Current antihyperlipidemic treatment includes statins. The current treatment provides significant improvement of lipids. Risk factors for coronary artery disease include diabetes mellitus, dyslipidemia, obesity and post-menopausal.  Diabetes  She presents for her follow-up diabetic visit. She has type 2 diabetes mellitus. Her disease course has been stable. Pertinent negatives for hypoglycemia include no dizziness. There are no diabetic associated symptoms. Symptoms are stable. Diabetic complications include heart disease. Pertinent negatives for diabetic complications include no CVA, nephropathy or peripheral neuropathy. Current diabetic treatment includes oral agent (monotherapy). She is compliant with treatment all of the time. She is following a generally healthy  diet. She participates in exercise daily. Her breakfast blood glucose range is generally 110-130 mg/dl. An ACE inhibitor/angiotensin II receptor blocker is being taken. Eye exam is not current.  Anemia  Presents for follow-up visit. There has been no bruising/bleeding easily, leg swelling, malaise/fatigue or palpitations. There is no history of heart failure.  Anxiety  Presents for follow-up visit. Symptoms include irritability. Patient reports no dizziness, muscle tension, palpitations or shortness of breath. Symptoms occur occasionally.   Her past medical history is significant for anemia.  Arthritis  Presents for follow-up visit. She complains of pain and stiffness. Affected locations include the left wrist, right wrist, neck, right MCP and left MCP. Her pain is at a severity of 3/10. Associated symptoms include pain while resting.  A Fib PT currently taking warfarin. See anticoagulation follow sheet.     Review of Systems  Constitutional: Positive for irritability. Negative for malaise/fatigue.  Respiratory: Negative for shortness of breath.   Cardiovascular: Negative for palpitations.  Musculoskeletal: Positive for arthritis and stiffness.  Neurological: Negative for dizziness.  Hematological: Does not bruise/bleed easily.  All other systems reviewed and are negative.      Objective:   Physical Exam  Constitutional: She is oriented to person, place, and time. She appears well-developed and well-nourished. No distress.  HENT:  Head: Normocephalic and atraumatic.  Right Ear: External ear normal.  Left Ear: External ear normal.  Nose: Nose normal.  Mouth/Throat: Oropharynx is clear and moist.  Eyes: Pupils are equal, round, and reactive to light.  Neck: Normal range of motion. Neck supple. No thyromegaly present.  Cardiovascular: Normal rate, regular rhythm, normal heart sounds and intact distal pulses.   No murmur heard. Pulmonary/Chest: Effort normal and breath sounds  normal. No respiratory distress. She has no wheezes.  Abdominal: Soft. Bowel sounds are normal. She exhibits no distension. There is no  tenderness.  Musculoskeletal: Normal range of motion. She exhibits no edema or tenderness.  Neurological: She is alert and oriented to person, place, and time.  Skin: Skin is warm and dry.  Psychiatric: She has a normal mood and affect. Her behavior is normal. Judgment and thought content normal.  Vitals reviewed.     BP 140/82   Pulse 67   Temp 97 F (36.1 C) (Oral)   Ht _0  (1.727 m)   Wt 179 lb 3.2 oz (81.3 kg)   BMI 27.25 kg/m      Assessment & Plan:  1. Coronary artery disease involving native coronary artery of native heart without angina pectoris - CMP14+EGFR  2. Essential hypertension - CMP14+EGFR  3. Persistent atrial fibrillation (HCC) - CMP14+EGFR Anticoagulation Dose Instructions as of 11/13/2016      Dorene Grebe Tue Wed Thu Fri Sat   New Dose 5 _1  mg 5 mg    Description   Warfarin 82m dose every day.       4. Rheumatoid arthritis involving both hands with positive rheumatoid factor (HCC) - CMP14+EGFR  5. Type 2 diabetes mellitus without complication, without long-term current use of insulin (HCC) - CMP14+EGFR - Bayer DCA Hb A1c Waived - Microalbumin / creatinine urine ratio  6. Anxiety and depression - CMP14+EGFR  7. Hyperlipidemia, unspecified hyperlipidemia type - CMP14+EGFR - Lipid panel  8. High risk medication use - CMP14+EGFR  9. Microcytic anemia - CMP14+EGFR   Continue all meds Labs pending Health Maintenance reviewed Diet and exercise encouraged RTO 4 months   CEvelina Dun FNP

## 2016-11-13 NOTE — Patient Instructions (Signed)

## 2016-11-13 NOTE — Addendum Note (Signed)
Addended by: Pollyann Kennedy F on: 11/13/2016 10:45 AM   Modules accepted: Orders

## 2016-11-14 LAB — MICROALBUMIN / CREATININE URINE RATIO
CREATININE, UR: 55.1 mg/dL
MICROALB/CREAT RATIO: 6.2 mg/g{creat} (ref 0.0–30.0)
MICROALBUM., U, RANDOM: 3.4 ug/mL

## 2016-11-15 ENCOUNTER — Other Ambulatory Visit: Payer: Self-pay | Admitting: Family

## 2016-11-15 DIAGNOSIS — M79641 Pain in right hand: Secondary | ICD-10-CM | POA: Diagnosis not present

## 2016-11-15 DIAGNOSIS — M25562 Pain in left knee: Secondary | ICD-10-CM | POA: Diagnosis not present

## 2016-11-15 DIAGNOSIS — M79642 Pain in left hand: Secondary | ICD-10-CM | POA: Diagnosis not present

## 2016-11-15 DIAGNOSIS — Z79899 Other long term (current) drug therapy: Secondary | ICD-10-CM | POA: Diagnosis not present

## 2016-11-15 DIAGNOSIS — M0570 Rheumatoid arthritis with rheumatoid factor of unspecified site without organ or systems involvement: Secondary | ICD-10-CM | POA: Diagnosis not present

## 2016-11-15 DIAGNOSIS — E875 Hyperkalemia: Secondary | ICD-10-CM

## 2016-11-15 MED ORDER — METFORMIN HCL 1000 MG PO TABS: 1000.0000 mg | ORAL_TABLET | Freq: Two times a day (BID) | ORAL | 3 refills | Status: DC

## 2016-11-17 ENCOUNTER — Other Ambulatory Visit: Payer: Self-pay | Admitting: Cardiology

## 2016-12-25 ENCOUNTER — Encounter: Payer: Self-pay | Admitting: Pharmacist

## 2016-12-25 ENCOUNTER — Ambulatory Visit (INDEPENDENT_AMBULATORY_CARE_PROVIDER_SITE_OTHER): Payer: Medicare Other | Admitting: Pharmacist

## 2016-12-25 DIAGNOSIS — I4819 Other persistent atrial fibrillation: Secondary | ICD-10-CM

## 2016-12-25 DIAGNOSIS — Z79899 Other long term (current) drug therapy: Secondary | ICD-10-CM

## 2016-12-25 DIAGNOSIS — I481 Persistent atrial fibrillation: Secondary | ICD-10-CM

## 2016-12-25 DIAGNOSIS — I714 Abdominal aortic aneurysm, without rupture, unspecified: Secondary | ICD-10-CM

## 2016-12-25 LAB — COAGUCHEK XS/INR WAIVED
INR: 2.9 — ABNORMAL HIGH (ref 0.9–1.1)
PROTHROMBIN TIME: 35.2 s

## 2017-01-08 ENCOUNTER — Encounter: Payer: Self-pay | Admitting: Family Medicine

## 2017-01-08 ENCOUNTER — Ambulatory Visit (INDEPENDENT_AMBULATORY_CARE_PROVIDER_SITE_OTHER): Payer: Medicare Other | Admitting: Family Medicine

## 2017-01-08 VITALS — BP 123/69 | HR 61 | Temp 97.8°F | Ht 68.0 in | Wt 175.6 lb

## 2017-01-08 DIAGNOSIS — M1712 Unilateral primary osteoarthritis, left knee: Secondary | ICD-10-CM

## 2017-01-08 DIAGNOSIS — F419 Anxiety disorder, unspecified: Secondary | ICD-10-CM | POA: Diagnosis not present

## 2017-01-08 DIAGNOSIS — M05741 Rheumatoid arthritis with rheumatoid factor of right hand without organ or systems involvement: Secondary | ICD-10-CM | POA: Diagnosis not present

## 2017-01-08 DIAGNOSIS — M05742 Rheumatoid arthritis with rheumatoid factor of left hand without organ or systems involvement: Secondary | ICD-10-CM

## 2017-01-08 MED ORDER — TRAMADOL HCL 50 MG PO TABS: 50.0000 mg | ORAL_TABLET | Freq: Three times a day (TID) | ORAL | 0 refills | Status: DC | PRN

## 2017-01-08 MED ORDER — ALPRAZOLAM 0.25 MG PO TABS: 0.2500 mg | ORAL_TABLET | Freq: Every day | ORAL | 1 refills | Status: DC | PRN

## 2017-01-08 NOTE — Patient Instructions (Signed)
Great to see you!  This mediation is sedating, please do not take either of them and drive.   Please use these medications sparingly. They are short term medications and we will need to have an appointment to for any refills because they are controlled substances.

## 2017-01-08 NOTE — Progress Notes (Signed)
   HPI  Patient presents today with knee pain and anxiety.  Patient explained she's had long-standing left knee arthritis managed by [facility]. Uses tramadol intermittently for extremely pain. She's had viscous supplementation with good improvement. [facility] refill this medication.  RA is stable, has not started methotrexate states that pain is really minimal as it pertains to RA. She sees Dr. Charlestine Night for this.  Anxiety Patient has recent difficulty with anxiety because her husband has been very ill. He had staph pneumonia and staph bacteremia. He is now on IV antibiotics at home. She has had a very difficult time with her anxiety recently. She states that she previously had a Xanax prescription 2 years ago which she uses sparingly.  She understands not to combine the 2 medications and also understands not to operate any machinery or vehicles after taking the medication.  PMH: Smoking status noted ROS: Per HPI  Objective: BP 123/69   Pulse 61   Temp 97.8 F (36.6 C) (Oral)   Ht '5\' 8"'$  (1.727 m)   Wt 175 lb 9.6 oz (79.7 kg)   BMI 26.70 kg/m  Gen: NAD, alert, cooperative with exam HEENT: NCAT CV: RRR, good S1/S2, no murmur Resp: CTABL, no wheezes, non-labored Ext: No edema, warm Neuro: Alert and oriented, No gross deficits  Assessment and plan:  # Anxiety Refilled Xanax, temporarily exacerbated from her husband's recent illness Discussed caution around using benzodiazepines.   # Left knee osteoarthritis Refilled tramadol, patient is using sparingly Denies any recent exacerbation and states that overall she is doing very well with her knee.  # Rheumatoid arthritis Stable Patient still not taking methotrexate, she's had several discussions about this with Dr. Charlestine Night She however is doing well currently.      Meds ordered this encounter  Medications  . traMADol (ULTRAM) 50 MG tablet    Sig: Take 1-2 tablets (50-100 mg total) by mouth every 8 (eight) hours as  needed.    Dispense:  30 tablet    Refill:  0  . ALPRAZolam (XANAX) 0.25 MG tablet    Sig: Take 1 tablet (0.25 mg total) by mouth daily as needed for anxiety.    Dispense:  30 tablet    Refill:  [place], MD [facility] 01/08/2017, 12:43 PM

## 2017-01-09 ENCOUNTER — Ambulatory Visit: Payer: Medicare Other | Admitting: Family Medicine

## 2017-01-15 DIAGNOSIS — M25432 Effusion, left wrist: Secondary | ICD-10-CM | POA: Diagnosis not present

## 2017-01-15 DIAGNOSIS — Z79899 Other long term (current) drug therapy: Secondary | ICD-10-CM | POA: Diagnosis not present

## 2017-01-15 DIAGNOSIS — M25431 Effusion, right wrist: Secondary | ICD-10-CM | POA: Diagnosis not present

## 2017-01-15 DIAGNOSIS — M79641 Pain in right hand: Secondary | ICD-10-CM | POA: Diagnosis not present

## 2017-01-15 DIAGNOSIS — M25562 Pain in left knee: Secondary | ICD-10-CM | POA: Diagnosis not present

## 2017-01-15 DIAGNOSIS — M79642 Pain in left hand: Secondary | ICD-10-CM | POA: Diagnosis not present

## 2017-01-15 DIAGNOSIS — M0570 Rheumatoid arthritis with rheumatoid factor of unspecified site without organ or systems involvement: Secondary | ICD-10-CM | POA: Diagnosis not present

## 2017-01-21 DIAGNOSIS — L84 Corns and callosities: Secondary | ICD-10-CM | POA: Diagnosis not present

## 2017-01-21 DIAGNOSIS — L603 Nail dystrophy: Secondary | ICD-10-CM | POA: Diagnosis not present

## 2017-01-21 DIAGNOSIS — I739 Peripheral vascular disease, unspecified: Secondary | ICD-10-CM | POA: Diagnosis not present

## 2017-01-21 DIAGNOSIS — E1151 Type 2 diabetes mellitus with diabetic peripheral angiopathy without gangrene: Secondary | ICD-10-CM | POA: Diagnosis not present

## 2017-01-25 ENCOUNTER — Ambulatory Visit (INDEPENDENT_AMBULATORY_CARE_PROVIDER_SITE_OTHER): Payer: Medicare Other | Admitting: Family Medicine

## 2017-01-25 ENCOUNTER — Encounter: Payer: Self-pay | Admitting: Family Medicine

## 2017-01-25 VITALS — BP 124/74 | HR 79 | Temp 97.4°F | Ht 68.0 in | Wt 174.6 lb

## 2017-01-25 DIAGNOSIS — Z7901 Long term (current) use of anticoagulants: Secondary | ICD-10-CM | POA: Diagnosis not present

## 2017-01-25 DIAGNOSIS — K5792 Diverticulitis of intestine, part unspecified, without perforation or abscess without bleeding: Secondary | ICD-10-CM | POA: Diagnosis not present

## 2017-01-25 DIAGNOSIS — I4891 Unspecified atrial fibrillation: Secondary | ICD-10-CM | POA: Diagnosis not present

## 2017-01-25 LAB — COAGUCHEK XS/INR WAIVED
INR: 1.7 — AB (ref 0.9–1.1)
Prothrombin Time: 21 s

## 2017-01-25 MED ORDER — CIPROFLOXACIN HCL 500 MG PO TABS: 500.0000 mg | ORAL_TABLET | Freq: Two times a day (BID) | ORAL | 0 refills | Status: DC

## 2017-01-25 MED ORDER — METRONIDAZOLE 500 MG PO TABS: 500.0000 mg | ORAL_TABLET | Freq: Three times a day (TID) | ORAL | 0 refills | Status: DC

## 2017-01-25 NOTE — Progress Notes (Signed)
   HPI  Patient presents today with concern for acute diverticulitis.  Her symptoms began 3 days ago comments characteristic of previous episodes of diverticulitis. She began Cipro and Flagyl that she had at home, she has improved and states that she is walking easier than she was. She is tolerating fluids, however she is very cautious to eat any foods. She is eating small amounts of crackers for the last day or 2.  She's taking other medications without problem.  She denies any fever, chills, sweats. She states that her abdominal pain is improving some.  She would like to pursue getting a partial colectomy for diverticulitis after she resolves from this episode.  PMH: Smoking status noted ROS: Per HPI  Objective: BP 124/74   Pulse 79   Temp 97.4 F (36.3 C) (Oral)   Ht '5\' 8"'$  (1.727 m)   Wt 174 lb 9.6 oz (79.2 kg)   BMI 26.55 kg/m  Gen: NAD, alert, cooperative with exam HEENT: NCAT CV: RRR, good S1/S2, no murmur Resp: CTABL, no wheezes, non-labored Abd: soft, tenderness to palpation of the left lower quadrant, otherwise soft with no guarding. Positive bowel sounds. Ext: No edema, warm Neuro: Alert and oriented, No gross deficits  Assessment and plan:  # Acute diverticulitis Treat with Cipro and Flagyl, patient is improving on the same currently. I offered CT scan to see where her abdomen was act, she declines this. I reviewed very specific flags for seeking emergent care, we discussed the possibility of perforation with diverticulitis.  # Chronic anticoagulation For afib INR is 1.7 today, given using Cipro and Flagyl currently I think she will likely be therapeutic without adjustment as this should make her INR slightly higher. Into new current dose, 35 mg Coumadin weekly.     Orders Placed This Encounter  Procedures  . CoaguChek XS/INR Waived    Meds ordered this encounter  Medications  . ciprofloxacin (CIPRO) 500 MG tablet    Sig: Take 1 tablet (500 mg  total) by mouth 2 (two) times daily.    Dispense:  14 tablet    Refill:  0  . metroNIDAZOLE (FLAGYL) 500 MG tablet    Sig: Take 1 tablet (500 mg total) by mouth 3 (three) times daily.    Dispense:  21 tablet    Refill:  0    Laroy Apple, MD [facility] 01/25/2017, 12:01 PM

## 2017-01-25 NOTE — Patient Instructions (Signed)
Great to see you!  Continue the medicines  Get your coumadin checked early. ( like next week)  Get help right away if you get worse or develop fevers   Diverticulitis Diverticulitis is when small pockets that have formed in your colon (large intestine) become infected or swollen. Follow these instructions at home:  Follow your doctor's instructions.  Follow a special diet if told by your doctor.  When you feel better, your doctor may tell you to change your diet. You may be told to eat a lot of fiber. Fruits and vegetables are good sources of fiber. Fiber makes it easier to poop (have bowel movements).  Take supplements or probiotics as told by your doctor.  Only take medicines as told by your doctor.  Keep all follow-up visits with your doctor. Contact a doctor if:  Your pain does not get better.  You have a hard time eating food.  You are not pooping like normal. Get help right away if:  Your pain gets worse.  Your problems do not get better.  Your problems suddenly get worse.  You have a fever.  You keep throwing up (vomiting).  You have bloody or black, tarry poop (stool). This information is not intended to replace advice given to you by your health care provider. Make sure you discuss any questions you have with your health care provider. Document Released: 05/14/2008 Document Revised: 05/03/2016 Document Reviewed: 10/21/2013 Elsevier Interactive Patient Education  2017 [employer].

## 2017-01-29 ENCOUNTER — Other Ambulatory Visit (HOSPITAL_COMMUNITY): Payer: Medicare Other

## 2017-01-29 ENCOUNTER — Ambulatory Visit: Payer: Medicare Other | Admitting: Family

## 2017-01-31 ENCOUNTER — Telehealth: Payer: Self-pay | Admitting: Pharmacist Clinician (PhC)/ Clinical Pharmacy Specialist

## 2017-01-31 MED ORDER — DOFETILIDE 250 MCG PO CAPS: 250.0000 ug | ORAL_CAPSULE | Freq: Two times a day (BID) | ORAL | 4 refills | Status: DC

## 2017-01-31 NOTE — Telephone Encounter (Signed)
Nya - did you call her, I did not

## 2017-01-31 NOTE — Telephone Encounter (Signed)
New message    Pt is calling, she states she missed a call. She wasn't sure if it was a pharmacist about her paper work she dropped off.

## 2017-02-04 ENCOUNTER — Telehealth: Payer: Self-pay | Admitting: Pharmacist

## 2017-02-04 NOTE — Telephone Encounter (Signed)
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 Paperwork received from patient last week 01/31/17.   Need signature from cardiologist, then should take about 10 days or the medication to get to the Office.   Patient aware of process and time frame. Will  Call back if samples needed or need Rx  to be send to local pharmacy.

## 2017-02-05 ENCOUNTER — Ambulatory Visit: Payer: Self-pay | Admitting: Family Medicine

## 2017-02-07 ENCOUNTER — Ambulatory Visit: Payer: Medicare Other | Admitting: Family Medicine

## 2017-02-13 ENCOUNTER — Telehealth: Payer: Self-pay | Admitting: Cardiology

## 2017-02-13 NOTE — Telephone Encounter (Signed)
Returned call to patient-states she is wanting an update on her Tikosyn application that she brought in.  Also requesting 3 month supply Rx for Tikosyn be sent to [facility] directly.    Advised I would follow up on application with pharmacist.

## 2017-02-13 NOTE — Telephone Encounter (Signed)
New message   Pt verbalized that she would like to speak to rn about the   [id number] application for tikosyn and getting a 3 month supply of tikosyn  Before its approved

## 2017-02-14 ENCOUNTER — Telehealth: Payer: Self-pay | Admitting: Cardiology

## 2017-02-14 NOTE — Telephone Encounter (Signed)
Verified RN taking care of paperwork.  Nya , please call patient to follow up

## 2017-02-14 NOTE — Telephone Encounter (Signed)
F/U Call:  Patient is calling back for updates on Tikosyn application, I spoke with the pharmacy at this time they did not have the application. Pharmacy states that they will call patient to follow-up. Thanks.

## 2017-02-14 NOTE — Telephone Encounter (Signed)
Patient calling of Tikosyn application updates. I contacted pharmacy and they stated they did not have application at this time but will contact patient to follow up, Thanks.

## 2017-02-18 NOTE — Telephone Encounter (Signed)
Paperwork faxed °

## 2017-02-19 ENCOUNTER — Encounter: Payer: Medicare Other | Admitting: Pharmacist

## 2017-02-21 ENCOUNTER — Ambulatory Visit (INDEPENDENT_AMBULATORY_CARE_PROVIDER_SITE_OTHER): Payer: Medicare Other | Admitting: Pharmacist

## 2017-02-21 DIAGNOSIS — Z79899 Other long term (current) drug therapy: Secondary | ICD-10-CM

## 2017-02-21 DIAGNOSIS — I4819 Other persistent atrial fibrillation: Secondary | ICD-10-CM

## 2017-02-21 DIAGNOSIS — I481 Persistent atrial fibrillation: Secondary | ICD-10-CM | POA: Diagnosis not present

## 2017-02-21 LAB — COAGUCHEK XS/INR WAIVED
INR: 2.2 — ABNORMAL HIGH (ref 0.9–1.1)
Prothrombin Time: 26.8 s

## 2017-03-16 ENCOUNTER — Other Ambulatory Visit: Payer: Self-pay | Admitting: Family Medicine

## 2017-03-19 ENCOUNTER — Ambulatory Visit: Payer: Self-pay | Admitting: Pharmacist

## 2017-03-26 ENCOUNTER — Ambulatory Visit (INDEPENDENT_AMBULATORY_CARE_PROVIDER_SITE_OTHER): Payer: Medicare Other | Admitting: Pharmacist

## 2017-03-26 ENCOUNTER — Encounter: Payer: Self-pay | Admitting: Pharmacist

## 2017-03-26 VITALS — BP 126/74 | HR 72 | Ht 67.75 in | Wt 169.8 lb

## 2017-03-26 DIAGNOSIS — Z79899 Other long term (current) drug therapy: Secondary | ICD-10-CM | POA: Diagnosis not present

## 2017-03-26 DIAGNOSIS — Z79811 Long term (current) use of aromatase inhibitors: Secondary | ICD-10-CM

## 2017-03-26 DIAGNOSIS — Z87891 Personal history of nicotine dependence: Secondary | ICD-10-CM

## 2017-03-26 DIAGNOSIS — E119 Type 2 diabetes mellitus without complications: Secondary | ICD-10-CM

## 2017-03-26 DIAGNOSIS — I481 Persistent atrial fibrillation: Secondary | ICD-10-CM | POA: Diagnosis not present

## 2017-03-26 DIAGNOSIS — M21961 Unspecified acquired deformity of right lower leg: Secondary | ICD-10-CM

## 2017-03-26 DIAGNOSIS — I4819 Other persistent atrial fibrillation: Secondary | ICD-10-CM

## 2017-03-26 DIAGNOSIS — Z0000 Encounter for general adult medical examination without abnormal findings: Secondary | ICD-10-CM | POA: Diagnosis not present

## 2017-03-26 LAB — COAGUCHEK XS/INR WAIVED
INR: 2.2 — AB (ref 0.9–1.1)
Prothrombin Time: 26 s

## 2017-03-26 MED ORDER — MAGNESIUM OXIDE 400 MG PO TABS: 400.0000 mg | ORAL_TABLET | Freq: Every day | ORAL | Status: DC

## 2017-03-26 NOTE — Progress Notes (Signed)
Subjective:   Melody Casey is a 71 y.o. female who presents for a subsequent Medicare Annual Wellness Visit and recheck INR / Protime  Melody Casey is married.  Her husband Melody Casey / Melody Casey is current in the hospital and has been in and out of hospital / skilled care for the last 3 to 4 months.  She does admit that she has been more stressed due to his illness.   Melody Casey has 2 grown children who live in [place]. They try to help when they can and were here in [place] the last time Melody Casey was hospitalized.   She lives in [place], [place] on a farm.  They still have some animals to care for but she sold several ducks and chickens when Melody Casey health started to decline.     Current Medications (verified) Outpatient Encounter Prescriptions as of 03/26/2017  Medication Sig  . ALPRAZolam (XANAX) 0.25 MG tablet Take 1 tablet (0.25 mg total) by mouth daily as needed for anxiety.  Marland Kitchen amLODipine (NORVASC) 2.5 MG tablet TAKE ONE TABLET BY MOUTH IN THE MORNING  . anastrozole (ARIMIDEX) 1 MG tablet Take 1 tablet (1 mg total) by mouth daily.  Marland Kitchen atorvastatin (LIPITOR) 40 MG tablet TAKE ONE AND ONE-HALF TABLETS BY MOUTH IN THE EVENING  . calcium-vitamin D (OSCAL WITH D) 500-200 MG-UNIT per tablet Take 1 tablet by mouth daily with breakfast.   . dofetilide (TIKOSYN) 250 MCG capsule Take 1 capsule (250 mcg total) by mouth 2 (two) times daily.  Marland Kitchen lisinopril (PRINIVIL,ZESTRIL) 40 MG tablet TAKE ONE TABLET BY MOUTH IN THE MORNING  . magnesium oxide (MAG-OX) 400 MG tablet Take 1 tablet (400 mg total) by mouth daily. Take 400 mg twice a day with food  . metFORMIN (GLUCOPHAGE) 500 MG tablet Take 500 mg by mouth daily.  . nitroGLYCERIN (NITROSTAT) 0.4 MG SL tablet Place 1 tablet (0.4 mg total) under the tongue every 5 (five) minutes as needed for chest pain.  . potassium chloride SA (K-DUR,KLOR-CON) 20 MEQ tablet Take 1 tablet (20 mEq total) by mouth 2 (two) times daily.  Marland Kitchen warfarin (COUMADIN) 5 MG tablet Take 1  tablet daily except on Thursdays take 1 and 1/2 tablets.  . [DISCONTINUED] magnesium oxide (MAG-OX) 400 MG tablet Take 400 mg twice a day with food (Patient taking differently: Take 400 mg by mouth daily. Take 400 mg twice a day with food)  . [DISCONTINUED] warfarin (COUMADIN) 5 MG tablet TAKE ONE TO ONE & ONE-HALF TABLETS BY MOUTH ONCE DAILY  . [DISCONTINUED] traMADol (ULTRAM) 50 MG tablet Take 1-2 tablets (50-100 mg total) by mouth every 8 (eight) hours as needed. (Patient not taking: Reported on 03/26/2017)   No facility-administered encounter medications on file as of 03/26/2017.     Allergies (verified) Miralax [polyethylene glycol]; Vancomycin; Acetaminophen; Oxycodone-acetaminophen; Sulfa antibiotics; Sulfacetamide sodium; Banana; Penicillins; Latex; and Tape   History: Past Medical History:  Diagnosis Date  . AAA (abdominal aortic aneurysm) ([place])   . Anxiety   . Arthritis   . Atrial fibrillation ([place])    a. Dx 03/2011 - on tikosyn/coumadin.  Marland Kitchen CAD (coronary artery disease)    a. NSTEMI 02/2011: occ mid Cx, DES to OM2, residual nonobst LAD dz.  . Cancer ([place])    breast  . Diabetes mellitus   . Diverticulitis   . Diverticulitis of colon     2008, 04/2011, 12/2014, 08/2015  . 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 Hemangioma  liver  . HLD (hyperlipidemia)   . HTN (hypertension)   . Osteoporosis   . Tobacco abuse    stopped smoking 2012  . Ureteral obstruction    History of gross hematuria/right hydronephrosis 2/2 to uteropelvic junction obstruction, s/p cystoscopy in January 2007 with bilateral retrograde pyelography, right ureter arthroscopy, right ureteral stent placement, bladder biopsies, stent removal since then.  . Wears dentures    top   Past Surgical History:  Procedure Laterality Date  . BIOPSY N/A 11/01/2015   Procedure: BIOPSY;  Surgeon: Danie Binder, MD;  Location: [facility];  Service: Endoscopy;  Laterality: N/A;  . BREAST LUMPECTOMY WITH NEEDLE  LOCALIZATION AND AXILLARY SENTINEL LYMPH NODE BX Right 03/01/2014   Procedure: BREAST LUMPECTOMY WITH NEEDLE LOCALIZATION AND AXILLARY SENTINEL LYMPH NODE BX;  Surgeon: Edward Jolly, MD;  Location: [facility];  Service: General;  Laterality: Right;  . BREAST SURGERY    . CARDIAC CATHETERIZATION  2012   stent  . COLONOSCOPY WITH PROPOFOL N/A 06/29/2014   Dr. Wynetta Emery: universal diverticulosis  . CORONARY STENT PLACEMENT  2012  . ESOPHAGOGASTRODUODENOSCOPY (EGD) WITH PROPOFOL N/A 11/01/2015   Procedure: ESOPHAGOGASTRODUODENOSCOPY (EGD) WITH PROPOFOL;  Surgeon: Danie Binder, MD;  Location: [facility];  Service: Endoscopy;  Laterality: N/A;  . KNEE ARTHROSCOPY     left  . Right leg surgery  age 52   As a child for  ?scleroderma per patient  . TONSILLECTOMY AND ADENOIDECTOMY    . TUBAL LIGATION    . Ureteral surgery  2011   rt ureterostomy-stent   Family History  Problem Relation Age of Onset  . Lung cancer Father 54    deceased  . Cancer Father   . Heart failure Mother 73    deceased  . Heart disease Mother   . Diabetes Brother   . Hypertension Brother   . Cancer Brother     prostate  . Colon cancer Neg Hx   . Liver disease Neg Hx    Social History   Occupational History  . 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 Smoking status: Former Smoker    Packs/day: 2.00    Years: 50.00    Types: Cigarettes    Quit date: 02/08/2011  . Smokeless tobacco: Never Used  . Alcohol use No  . Drug use: No  . Sexual activity: No    Do you feel safe at home?  Yes  Dietary issues and exercise activities: Current Exercise Habits: The patient does not participate in regular exercise at present  Current Dietary habits:  Eating on the run a lot over the last 2 weeks.  Eating at hospital.  Trying to limit sugar and starch intake.   Objective:    Today's Vitals   03/26/17 1027  BP: 126/74  Pulse: 72  Weight: 169 lb 12 oz (77 kg)  Height: 5' 7.75"  (1.721 m)  PainSc: 4   PainLoc: Generalized   Body mass index is 26 kg/m.   INR was 2.2 today  Diabetic Foot Form - Detailed   Diabetic Foot Exam - detailed Diabetic Foot exam was performed with the following findings:  Yes 03/26/2017 10:23 AM  Visual Foot Exam completed.:  Yes  Is there a history of foot ulcer?:  Yes Can the patient see the bottom of their feet?:  Yes Are the shoes appropriate in style and fit?:  Yes Is there swelling or and abnormal foot shape?:  Yes Are  the toenails long?:  Yes Are the toenails thick?:  Yes Do you have pain in calf while walking?:  No Is there a claw toe deformity?:  Yes Is there elevated skin temparature?:  No Is there limited skin dorsiflexion?:  No Is there foot or ankle muscle weakness?:  Yes Are the toenails ingrown?:  No Normal Range of Motion:  Yes Pulse Foot Exam completed.:  Yes  Right posterior Tibialias:  Diminished Left posterior Tibialias:  Present  Right Dorsalis Pedis:  Diminished Left Dorsalis Pedis:  Present  Sensory Foot Exam Completed.:  Yes Swelling:  No Semmes-Weinstein Monofilament Test R Foot Test Control:  Pos L Foot Test Control:  Pos  R Site 1-Great Toe:  Neg L Site 1-Great Toe:  Pos  R Site 4:  Neg L Site 4:  Pos  R Site 5:  Pos L Site 5:  Neg    Comments:  Patient has severe deformity of right foot. 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  When she was in her 20's she has an ulcer on her right foot which required surgery.      Activities of Daily Living In your present state of health, do you have any difficulty performing the following activities: 03/26/2017  Hearing? Y  Vision? N  Difficulty concentrating or making decisions? N  Walking or climbing stairs? Y  Dressing or bathing? N  Doing errands, shopping? N  Preparing Food and eating ? N  Using the Toilet? N  In the past six months, have you accidently leaked  urine? N  Do you have problems with loss of bowel control? N  Managing your Medications? N  Managing your Finances? N  Housekeeping or managing your Housekeeping? N  Some recent data might be hidden     Cardiac Risk Factors include: advanced age (>75mn, >>51women);diabetes mellitus;dyslipidemia;hypertension;sedentary lifestyle  Depression Screen PHQ 2/9 Scores 03/26/2017 01/25/2017 01/08/2017 11/13/2016  PHQ - 2 Score 3 1 0 0  PHQ- 9 Score 4 - - -     Fall Risk Fall Risk  03/26/2017 01/25/2017 11/13/2016 03/26/2016 03/21/2016  Falls in the past year? No No No No No    Cognitive Function: MMSE - Mini Mental State Exam 11/16/2015  Orientation to time 5  Orientation to Place 5  Registration 3  Attention/ Calculation 5  Recall 3  Language- name 2 objects 2  Language- repeat 1  Language- follow 3 step command 3  Language- read & follow direction 1  Write a sentence 1  Copy design 1  Total score 30    Immunizations and Health Maintenance Immunization History  Administered Date(s) Administered  . Tdap 05/02/2012   Health Maintenance Due  Topic Date Due  . FOOT EXAM  11/15/2016  . OPHTHALMOLOGY EXAM  01/11/2017    Patient Care Team: STimmothy Euler MD as PCP - General (Family Medicine) SDanie Binder MD (Gastroenterology) JMinus Breeding MD as Consulting Physician (Cardiology) GChauncey Cruel MD as Consulting Physician (Oncology)  Indicate any recent Medical Services you may have received from other than [facility] providers in the past year (date may be approximate).    Assessment:    Annual Wellness Visit  Therapeutic anticoagulation   Screening Tests Health Maintenance  Topic Date Due  . FOOT EXAM  11/15/2016  . OPHTHALMOLOGY EXAM  01/11/2017  . PNA vac Low Risk Adult (1 of 2 -  PCV13) 11/13/2017 (Originally 01/16/2009)  . MAMMOGRAM  04/10/2017  . HEMOGLOBIN A1C  05/14/2017  . INFLUENZA VACCINE  07/10/2017  . TETANUS/TDAP  05/02/2022  . COLONOSCOPY  06/29/2024    . DEXA SCAN  Completed  . Hepatitis C Screening  Completed        Plan:   During the course of the visit Yameli was educated and counseled about the following appropriate screening and preventive services:   Vaccines to include Pneumoccal, Influenza, Td and Shingles ($45).  Patient is considering Prevnar 13 and Shingrix  Colorectal cancer screening - UTD  Cardiovascular disease screening - EKG is UTD;   Lipids at goal - taking statin;  BP at goal today  Diabetes - last A1c was good at 7.4%  Bone Denisty / Osteoporosis Screening next due 04/2017 - ordered for next visit  Mammogram - need to request results form 2017  PAP - no longer required  Glaucoma screening / Diabetic Eye Exam - reminded patient that eye exam is past due  Nutrition counseling - discussed increasing fruits and vegetables intake  Smoking cessation counseling - patient quiet smoking in 2012.  She has a history of smoking over 50 years.  Lung CT ordered  Advanced Directives - UTD, will bring in copy  Physical Activity - as able.    Referral to podiatrist for nail care / foot care.  RTC  1 month for INR  Anticoagulation Dose Instructions as of 03/26/2017      Dorene Grebe Tue Wed Thu Fri Sat   New Dose 5 mg 5 mg 5 mg 5 mg 5 mg 5 mg 5 mg    Description   Continue current warfarin '5mg'$  dose - take 1 tablet daily  INR was 2.2 today     Patient Instructions (the written plan) were given to the patient.   Cherre Robins, PharmD   03/26/2017

## 2017-03-26 NOTE — Patient Instructions (Addendum)
  Melody Casey , Thank you for taking time to come for your Medicare Wellness Visit. I appreciate your ongoing commitment to your health goals. Please review the following plan we discussed and let me know if I can assist you in the future.   These are the goals we discussed:  Look for copy of [place] (important to know where these are kept - you can also bring copy to our office to be placed in our file / electronic chart)  Increase non-starchy vegetables - carrots, green bean, squash, zucchini, tomatoes, onions, peppers, spinach and other green leafy vegetables, cabbage, lettuce, cucumbers, asparagus, okra (not fried), eggplant Limit sugar and processed foods (cakes, cookies, ice cream, crackers and chips) Increase fresh fruit but limit serving sizes 1/2 cup or about the size of tennis or baseball Limit red meat to no more than 1-2 times per week (serving size about the size of your palm) Choose whole grains / lean proteins - whole wheat bread, quinoa, whole grain rice (1/2 cup), fish, chicken, [place] Avoid sugar and calorie containing beverages - soda, sweet tea and juice.  Choose water or unsweetened tea instead.  I am sending referrals low dose CT scan (lung cancer screening), Bone Density (will get at next protime appointment / done in our office) and referral for podiatrist - Dr Irving Shows.     This is a list of the screening recommended for you and due dates:  Health Maintenance  Topic Date Due  . Complete foot exam   Done today  . Eye exam for diabetics  01/11/2017 - due now  . Pneumonia vaccines (1 of 2 - PCV13) Due - considering  . Mammogram  04/10/2017  . Hemoglobin A1C  05/14/2017  . Flu Shot  07/10/2017  . Tetanus Vaccine  05/02/2022  . Colon Cancer Screening  06/29/2024  . DEXA scan (bone density measurement)  Due 04/2017  .  Hepatitis C: One time screening is recommended by Center for Disease Control  (CDC) for  adults  born from [birth year] through 1967.   Completed  *Topic was postponed. The date shown is not the original due date.

## 2017-04-04 ENCOUNTER — Other Ambulatory Visit: Payer: Medicare Other

## 2017-04-10 ENCOUNTER — Ambulatory Visit: Payer: Medicare Other | Admitting: Family Medicine

## 2017-04-10 ENCOUNTER — Ambulatory Visit (HOSPITAL_COMMUNITY): Payer: Medicare Other

## 2017-04-11 ENCOUNTER — Ambulatory Visit (INDEPENDENT_AMBULATORY_CARE_PROVIDER_SITE_OTHER): Payer: Medicare Other | Admitting: Family Medicine

## 2017-04-11 ENCOUNTER — Encounter: Payer: Self-pay | Admitting: Family Medicine

## 2017-04-11 VITALS — BP 139/76 | HR 63 | Temp 97.4°F | Ht 67.75 in | Wt 166.0 lb

## 2017-04-11 DIAGNOSIS — F329 Major depressive disorder, single episode, unspecified: Secondary | ICD-10-CM | POA: Diagnosis not present

## 2017-04-11 DIAGNOSIS — F419 Anxiety disorder, unspecified: Secondary | ICD-10-CM

## 2017-04-11 DIAGNOSIS — F32A Depression, unspecified: Secondary | ICD-10-CM

## 2017-04-11 MED ORDER — ALPRAZOLAM 0.25 MG PO TABS: 0.2500 mg | ORAL_TABLET | Freq: Every day | ORAL | 1 refills | Status: DC | PRN

## 2017-04-11 NOTE — Progress Notes (Signed)
BP 139/76   Pulse 63   Temp 97.4 F (36.3 C) (Oral)   Ht 5' 7.75" (1.721 m)   Wt 166 lb (75.3 kg)   BMI 25.43 kg/m    Subjective:    Patient ID: Melody Casey, female    DOB: [birth year], 71 y.o.   MRN: [id number]  HPI: Melody Casey is a 71 y.o. female presenting on 04/11/2017 for Medication Refill (Xanax)   HPI Anxiety and depression check. Patient is coming in for an anxiety and depression check. She says she is still dealing with a lot of issues because her husband is still in a rehabilitation facility after being hospitalized for sepsis and pneumonia and bacteremia and he is being treated and tried to get up strength so that he can come back home. She often drives down to [place] once a day which is causing a lot of stress for her as well. She just feels more anxious and is having to use the Xanax almost daily. She just needs a refill for it today. She denies any suicidal ideations or thoughts of hurting herself. She feels like things will calm down a lot once her husband gets out of the facility and comes home.  Relevant past medical, surgical, family and social history reviewed and updated as indicated. Interim medical history since our last visit reviewed. Allergies and medications reviewed and updated.  Review of Systems  Constitutional: Negative for chills and fever.  Respiratory: Negative for chest tightness and shortness of breath.   Cardiovascular: Negative for chest pain and leg swelling.  Musculoskeletal: Negative for back pain and gait problem.  Psychiatric/Behavioral: Positive for dysphoric mood. Negative for agitation, behavioral problems, self-injury, sleep disturbance and suicidal ideas. The patient is nervous/anxious.   All other systems reviewed and are negative.   Per HPI unless specifically indicated above     Objective:    BP 139/76   Pulse 63   Temp 97.4 F (36.3 C) (Oral)   Ht 5' 7.75" (1.721 m)   Wt 166 lb (75.3 kg)   BMI 25.43 kg/m   Wt  Readings from Last 3 Encounters:  04/11/17 166 lb (75.3 kg)  03/26/17 169 lb 12 oz (77 kg)  01/25/17 174 lb 9.6 oz (79.2 kg)    Physical Exam  Constitutional: She is oriented to person, place, and time. She appears well-developed and well-nourished. No distress.  Eyes: Conjunctivae are normal.  Neck: Neck supple. No thyromegaly present.  Cardiovascular: Normal rate, regular rhythm, normal heart sounds and intact distal pulses.   No murmur heard. Pulmonary/Chest: Effort normal and breath sounds normal. No respiratory distress. She has no wheezes.  Musculoskeletal: Normal range of motion.  Lymphadenopathy:    She has no cervical adenopathy.  Neurological: She is alert and oriented to person, place, and time. Coordination normal.  Skin: Skin is warm and dry. No rash noted. She is not diaphoretic.  Psychiatric: Her behavior is normal. Her mood appears anxious. She exhibits a depressed mood.  Nursing note and vitals reviewed.     Assessment & Plan:   Problem List Items Addressed This Visit      Other   Anxiety and depression - Primary   Relevant Medications   ALPRAZolam (XANAX) 0.25 MG tablet       Follow up plan: Return if symptoms worsen or fail to improve.  Counseling provided for all of the vaccine components No orders of the defined types were placed in this encounter.   [name],  MD [facility] 04/11/2017, 4:23 PM

## 2017-04-14 ENCOUNTER — Other Ambulatory Visit: Payer: Self-pay | Admitting: Oncology

## 2017-04-30 ENCOUNTER — Ambulatory Visit (INDEPENDENT_AMBULATORY_CARE_PROVIDER_SITE_OTHER): Payer: Medicare Other

## 2017-04-30 ENCOUNTER — Ambulatory Visit (INDEPENDENT_AMBULATORY_CARE_PROVIDER_SITE_OTHER): Payer: Medicare Other | Admitting: Pharmacist

## 2017-04-30 DIAGNOSIS — Z79811 Long term (current) use of aromatase inhibitors: Secondary | ICD-10-CM | POA: Diagnosis not present

## 2017-04-30 DIAGNOSIS — M818 Other osteoporosis without current pathological fracture: Secondary | ICD-10-CM

## 2017-04-30 DIAGNOSIS — I481 Persistent atrial fibrillation: Secondary | ICD-10-CM

## 2017-04-30 DIAGNOSIS — Z780 Asymptomatic menopausal state: Secondary | ICD-10-CM

## 2017-04-30 DIAGNOSIS — Z79899 Other long term (current) drug therapy: Secondary | ICD-10-CM

## 2017-04-30 DIAGNOSIS — I4819 Other persistent atrial fibrillation: Secondary | ICD-10-CM

## 2017-04-30 LAB — COAGUCHEK XS/INR WAIVED
INR: 2.3 — AB (ref 0.9–1.1)
Prothrombin Time: 27.7 s

## 2017-04-30 NOTE — Progress Notes (Signed)
Patient ID: Melody Casey, female   DOB: October 06, 1946, 71 y.o.   MRN: [id number]    HPI: Melody Casey is here to have DEXA checked and INR checked.  Melody Casey last DEXA was 2016 prior to when she was a patient at our office.  She was noted to have osteoporosis on last DEXA.  She is not currently taking any medications for osteoporosis.  She has a history of breast cancer and is currently taking anastrozole qd.  She thinks that she will finished 5 years of anastrozole therapy when see next sees oncologist Dr Rande Lawman in June 2018.   Back Pain?  Yes       Kyphosis?  Yes Prior fracture?  No  Patient is also taking chronic anticoagulation therapy - warfarin due to persistant atrial fibrillation                                                             PMH: Hysterectomy?  No Oophorectomy?  No HRT? No Steroid Use?  No Thyroid med?  No History of cancer?  Yes - breast cacer History of digestive disorders (ie Crohn's)?  No Current or previous eating disorders?  No Last Vitamin D Result:  needed Last GFR Result:  73 (11/23/2016)   FH/SH: Family history of osteoporosis?  No Parent with history of hip fracture?  Yes - mother Family history of breast cancer?  No Exercise?  No Smoking?  No - quit 2012 Alcohol?  No    Calcium Assessment Calcium Intake  # of servings/day  Calcium mg  Milk (8 oz) 0  x  300  = 0  Yogurt (4 oz) 0 x  200 = 0  Cheese (1 oz) 1 x  200 = 1  Other Calcium sources   '250mg'$   Ca supplement '500mg'$  = '500mg'$    Estimated calcium intake per day '750mg'$     INR was 2.3 today  DEXA Results Date of Test T-Score for [facility] Spine L1-L4 T-Score for Neck of  Left Hip  04/30/2017 0.5 -3.7  04/11/2015 (from [place]) -- -3.5            Assessment: Osteoporosis Therapeutic anticoagulation  Recommendations: 1.  Discussed bisphosphonate and forteo therapies.  Patient is to se oncologist in June.  She would like DEXA results sent to Dr Jana Hakim before treatment decision is made 2.   recommend calcium '1200mg'$  daily through supplementation or diet.  3.  recommend weight bearing exercise - 30 minutes at least 4 days per week.   4.  Counseled and educated about fall risk and prevention. 5.  Continue current warfarin dose - recheck INR in 1 month. - will also check vitamin D at that time as well.  Recheck DEXA:  2 years  Time spent counseling patient:  35 minutes

## 2017-05-07 ENCOUNTER — Other Ambulatory Visit: Payer: Self-pay | Admitting: Family Medicine

## 2017-05-07 ENCOUNTER — Other Ambulatory Visit: Payer: Self-pay

## 2017-05-07 MED ORDER — ATORVASTATIN CALCIUM 40 MG PO TABS: ORAL_TABLET | ORAL | 0 refills | Status: DC

## 2017-05-13 ENCOUNTER — Other Ambulatory Visit: Payer: Self-pay | Admitting: Cardiology

## 2017-05-14 DIAGNOSIS — Z853 Personal history of malignant neoplasm of breast: Secondary | ICD-10-CM | POA: Diagnosis not present

## 2017-05-14 DIAGNOSIS — R928 Other abnormal and inconclusive findings on diagnostic imaging of breast: Secondary | ICD-10-CM | POA: Diagnosis not present

## 2017-05-19 ENCOUNTER — Other Ambulatory Visit: Payer: Self-pay | Admitting: Cardiology

## 2017-05-29 ENCOUNTER — Encounter: Payer: Self-pay | Admitting: Cardiology

## 2017-06-03 ENCOUNTER — Ambulatory Visit (INDEPENDENT_AMBULATORY_CARE_PROVIDER_SITE_OTHER): Payer: Medicare Other | Admitting: Pharmacist

## 2017-06-03 DIAGNOSIS — I481 Persistent atrial fibrillation: Secondary | ICD-10-CM

## 2017-06-03 DIAGNOSIS — Z79899 Other long term (current) drug therapy: Secondary | ICD-10-CM

## 2017-06-03 DIAGNOSIS — I4819 Other persistent atrial fibrillation: Secondary | ICD-10-CM

## 2017-06-03 DIAGNOSIS — E119 Type 2 diabetes mellitus without complications: Secondary | ICD-10-CM

## 2017-06-03 DIAGNOSIS — M810 Age-related osteoporosis without current pathological fracture: Secondary | ICD-10-CM | POA: Diagnosis not present

## 2017-06-03 LAB — BAYER DCA HB A1C WAIVED: HB A1C (BAYER DCA - WAIVED): 7.1 % — ABNORMAL HIGH (ref <7.0)

## 2017-06-03 LAB — COAGUCHEK XS/INR WAIVED
INR: 2 — ABNORMAL HIGH (ref 0.9–1.1)
Prothrombin Time: 24.2 s

## 2017-06-04 ENCOUNTER — Other Ambulatory Visit: Payer: Self-pay | Admitting: Pharmacist

## 2017-06-04 DIAGNOSIS — R7989 Other specified abnormal findings of blood chemistry: Secondary | ICD-10-CM | POA: Insufficient documentation

## 2017-06-04 LAB — CMP14+EGFR
ALK PHOS: 109 IU/L (ref 39–117)
ALT: 16 IU/L (ref 0–32)
AST: 13 IU/L (ref 0–40)
Albumin/Globulin Ratio: 1.1 — ABNORMAL LOW (ref 1.2–2.2)
Albumin: 3.4 g/dL — ABNORMAL LOW (ref 3.5–4.8)
BUN/Creatinine Ratio: 23 (ref 12–28)
BUN: 18 mg/dL (ref 8–27)
Bilirubin Total: 0.4 mg/dL (ref 0.0–1.2)
CALCIUM: 10 mg/dL (ref 8.7–10.3)
CO2: 20 mmol/L (ref 20–29)
CREATININE: 0.78 mg/dL (ref 0.57–1.00)
Chloride: 102 mmol/L (ref 96–106)
GFR calc Af Amer: 87 mL/min/{1.73_m2} (ref >59)
GFR, EST NON AFRICAN AMERICAN: 76 mL/min/{1.73_m2} (ref >59)
GLUCOSE: 210 mg/dL — AB (ref 65–99)
Globulin, Total: 3.2 g/dL (ref 1.5–4.5)
Potassium: 4 mmol/L (ref 3.5–5.2)
SODIUM: 137 mmol/L (ref 134–144)
Total Protein: 6.6 g/dL (ref 6.0–8.5)

## 2017-06-04 LAB — VITAMIN D 25 HYDROXY (VIT D DEFICIENCY, FRACTURES): VIT D 25 HYDROXY: 28.1 ng/mL — AB (ref 30.0–100.0)

## 2017-06-04 MED ORDER — CHOLECALCIFEROL 25 MCG (1000 UT) PO TBDP: 1.0000 | ORAL_TABLET | Freq: Every day | ORAL | Status: DC

## 2017-06-05 ENCOUNTER — Other Ambulatory Visit: Payer: Self-pay | Admitting: *Deleted

## 2017-06-05 DIAGNOSIS — C50411 Malignant neoplasm of upper-outer quadrant of right female breast: Secondary | ICD-10-CM

## 2017-06-06 ENCOUNTER — Other Ambulatory Visit (HOSPITAL_BASED_OUTPATIENT_CLINIC_OR_DEPARTMENT_OTHER): Payer: Medicare Other

## 2017-06-06 ENCOUNTER — Ambulatory Visit (HOSPITAL_BASED_OUTPATIENT_CLINIC_OR_DEPARTMENT_OTHER): Payer: Medicare Other | Admitting: Oncology

## 2017-06-06 VITALS — BP 154/60 | HR 71 | Temp 98.2°F | Resp 18 | Ht 67.75 in | Wt 175.3 lb

## 2017-06-06 DIAGNOSIS — M810 Age-related osteoporosis without current pathological fracture: Secondary | ICD-10-CM

## 2017-06-06 DIAGNOSIS — C50411 Malignant neoplasm of upper-outer quadrant of right female breast: Secondary | ICD-10-CM

## 2017-06-06 DIAGNOSIS — Z79811 Long term (current) use of aromatase inhibitors: Secondary | ICD-10-CM

## 2017-06-06 DIAGNOSIS — Z170 Estrogen receptor positive status [ER+]: Secondary | ICD-10-CM | POA: Diagnosis not present

## 2017-06-06 LAB — CBC WITH DIFFERENTIAL/PLATELET
BASO%: 0.2 % (ref 0.0–2.0)
BASOS ABS: 0 10*3/uL (ref 0.0–0.1)
EOS ABS: 0 10*3/uL (ref 0.0–0.5)
EOS%: 0.2 % (ref 0.0–7.0)
HEMATOCRIT: 38.3 % (ref 34.8–46.6)
HEMOGLOBIN: 12.5 g/dL (ref 11.6–15.9)
LYMPH#: 0.8 10*3/uL — AB (ref 0.9–3.3)
LYMPH%: 16.2 % (ref 14.0–49.7)
MCH: 28.5 pg (ref 25.1–34.0)
MCHC: 32.6 g/dL (ref 31.5–36.0)
MCV: 87.2 fL (ref 79.5–101.0)
MONO#: 0.2 10*3/uL (ref 0.1–0.9)
MONO%: 3.2 % (ref 0.0–14.0)
NEUT%: 80.2 % — ABNORMAL HIGH (ref 38.4–76.8)
NEUTROS ABS: 3.8 10*3/uL (ref 1.5–6.5)
Platelets: 156 10*3/uL (ref 145–400)
RBC: 4.39 10*6/uL (ref 3.70–5.45)
RDW: 15.7 % — AB (ref 11.2–14.5)
WBC: 4.7 10*3/uL (ref 3.9–10.3)

## 2017-06-06 LAB — COMPREHENSIVE METABOLIC PANEL
ALBUMIN: 3.3 g/dL — AB (ref 3.5–5.0)
ALT: 14 U/L (ref 0–55)
AST: 17 U/L (ref 5–34)
Alkaline Phosphatase: 115 U/L (ref 40–150)
Anion Gap: 5 mEq/L (ref 3–11)
BILIRUBIN TOTAL: 0.52 mg/dL (ref 0.20–1.20)
BUN: 18.3 mg/dL (ref 7.0–26.0)
CALCIUM: 10.6 mg/dL — AB (ref 8.4–10.4)
CO2: 24 mEq/L (ref 22–29)
Chloride: 105 mEq/L (ref 98–109)
Creatinine: 1 mg/dL (ref 0.6–1.1)
EGFR: 58 mL/min/{1.73_m2} — ABNORMAL LOW (ref >90)
GLUCOSE: 311 mg/dL — AB (ref 70–140)
Potassium: 4.5 mEq/L (ref 3.5–5.1)
SODIUM: 134 meq/L — AB (ref 136–145)
TOTAL PROTEIN: 7.7 g/dL (ref 6.4–8.3)

## 2017-06-06 NOTE — Progress Notes (Signed)
[place]  [phone] [phone]     ID: LATORA QUARRY OB: July 29, 1946  MR#: [id number]  MPN#:[id number]  PCP: Timmothy Euler, MD GYN:   SU: Excell Seltzer OTHER MD: [name], Arley Phenix, Christene Slates, Evelena Peat truslow  CHIEF COMPLAINT: Estrogen receptor positive breast cancer   CURRENT TREATMENT: Anastrozole  BREAST CANCER HISTORY: From the original intake note:   "Izora Gala" had routine breast mammography at [facility] 01/14/2014. The breast density was category B. A new cluster of heterogeneous calcifications were noted in the right breast and on 01/28/2014 the patient underwent stereotactic biopsy of this area with the pathology ([id number]) showing an invasive ductal carcinoma, grade 1, estrogen receptor 100% positive, with strong staining intensity; progesterone receptor 63% positive, with moderate staining intensity, with an MIB-1 of 26% and no HER-2 amplification, the ratio being 1.44 and the number per cell 2.80.  On 02/01/2014 the patient underwent bilateral MRI of the breast, with no enhancement associated with the previously biopsied area. However there was a separate 7 mm enhancing mass in the lower inner quadrant and biopsy of this mass 02/03/2014 showed benign breast parenchyma, with no atypia. Review of this pathology showed atypical ductal hyperplasia, and therefore excisional biopsy was recommended, performed 02/10/2014 and showing only atypical ductal hyperplasia, with adenosis and calcifications ([id number]).  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 Tumor.   Her subsequent history is as detailed below  INTERVAL HISTORY: Izora Gala returns today for follow-up and treatment of her estrogen receptor positive breast cancer. She continues on anastrozole,  with good tolerance.  Hot flashes and vaginal dryness are not a major issue. She never developed the arthralgias or myalgias that many patients can experience on this medication. She obtains it at a good price. Marland Kitchen  REVIEW OF SYSTEMS: Izora Gala tells me her husband has had significant medical problems and she continues to be his primary caregiver. This takes a lot of her time. She is not exercising regularly as a result. She denies unusual headaches, visual changes, nausea or vomiting. She tells me her rheumatoid arthritis is under control. She never did start the methotrexate prescribed by Dr. Debbora Lacrosse. He has since retired and she has not yet established herself with a new rheumatologist. Aside from these issues a detailed review of systems today was stable  PAST MEDICAL HISTORY: Past Medical History:  Diagnosis Date  . AAA (abdominal aortic aneurysm) ([place])   . Anxiety   . Arthritis   . Atrial fibrillation ([place])    a. Dx 03/2011 - on tikosyn/coumadin.  Marland Kitchen CAD (coronary artery disease)    a. NSTEMI 02/2011: occ mid Cx, DES to OM2, residual nonobst LAD dz.  . Cancer ([place])    breast  . Diabetes mellitus   . Diverticulitis   . Diverticulitis of colon     2008, 04/2011, 12/2014, 08/2015  . 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 Hemangioma    liver  . HLD (hyperlipidemia)   . HTN (hypertension)   . Osteoporosis   . Tobacco abuse    stopped smoking 2012  . Ureteral obstruction    History of gross hematuria/right hydronephrosis 2/2 to uteropelvic junction obstruction, s/p cystoscopy in January 2007 with bilateral retrograde pyelography, right ureter arthroscopy, right ureteral stent placement, bladder biopsies, stent removal since then.  Marland Kitchen  Wears dentures    top    PAST SURGICAL HISTORY: Past Surgical History:  Procedure Laterality Date  . BIOPSY N/A 11/01/2015   Procedure: BIOPSY;  Surgeon: Danie Binder, MD;  Location: [facility];  Service: Endoscopy;  Laterality: N/A;  . BREAST  LUMPECTOMY WITH NEEDLE LOCALIZATION AND AXILLARY SENTINEL LYMPH NODE BX Right 03/01/2014   Procedure: BREAST LUMPECTOMY WITH NEEDLE LOCALIZATION AND AXILLARY SENTINEL LYMPH NODE BX;  Surgeon: Edward Jolly, MD;  Location: [place];  Service: General;  Laterality: Right;  . BREAST SURGERY    . CARDIAC CATHETERIZATION  2012   stent  . COLONOSCOPY WITH PROPOFOL N/A 06/29/2014   Dr. Wynetta Emery: universal diverticulosis  . CORONARY STENT PLACEMENT  2012  . ESOPHAGOGASTRODUODENOSCOPY (EGD) WITH PROPOFOL N/A 11/01/2015   Procedure: ESOPHAGOGASTRODUODENOSCOPY (EGD) WITH PROPOFOL;  Surgeon: Danie Binder, MD;  Location: [facility];  Service: Endoscopy;  Laterality: N/A;  . KNEE ARTHROSCOPY     left  . Right leg surgery  age 89   As a child for  ?scleroderma per patient  . TONSILLECTOMY AND ADENOIDECTOMY    . TUBAL LIGATION    . Ureteral surgery  2011   rt ureterostomy-stent    FAMILY HISTORY Family History  Problem Relation Age of Onset  . Lung cancer Father 31       deceased  . Cancer Father   . Heart failure Mother 70       deceased  . Heart disease Mother   . Diabetes Brother   . Hypertension Brother   . Cancer Brother        prostate  . Colon cancer Neg Hx   . Liver disease Neg Hx   The patient's father died at the age of 81 with a history of lung cancer. He was a smoker and also had asbestos exposure. The patient's mother died at the age of 5. The patient had one brother. There is no other history of cancer in the family to her knowledge  GYNECOLOGIC HISTORY:  Menarche age 79, first live birth age 89. The patient stopped having periods approximately 1997. She did not take hormone replacement.   SOCIAL HISTORY:  Izora Gala works part time for [employer]. Her husband of 20+ years, [name] "[name]" Amerman is a retired Futures trader. He has a history of head and neck cancer treated at [facility] years ago. Daughter Denica Web lives in [name] and  is a Scientist, clinical (histocompatibility and immunogenetics). Son [name] lives in [name] and he is an Conservator, museum/gallery. The patient has 7 grandchildren and 2 great-grandchildren. She attends [employer]     ADVANCED DIRECTIVES:  in place    HEALTH MAINTENANCE: Social History  Substance Use Topics  . Smoking status: Former Smoker    Packs/day: 2.00    Years: 50.00    Types: Cigarettes    Quit date: 02/08/2011  . Smokeless tobacco: Never Used  . Alcohol use No     Colonoscopy: never   PAP: 2013  Bone density:Never  Lipid panel:  Allergies  Allergen Reactions  . Miralax [Polyethylene Glycol] Swelling and Rash    Took [facility] brand developed rash. Patient states she tolerated name brand MiraLax in the past.  09/01/15. Patient states she had diffuse swelling including swelling of her lips.   . Vancomycin Rash and Shortness Of Breath  . Acetaminophen Hives  . Oxycodone-Acetaminophen Itching  . Sulfa Antibiotics Rash    All  over rash  . Sulfacetamide Sodium Rash    All over rash  . Banana Other (See Comments)    Unknown  . Penicillins Other (See Comments)    Pt had so much as a child she's immune to it  . Latex Rash  . Tape Rash    Current Outpatient Prescriptions  Medication Sig Dispense Refill  . ALPRAZolam (XANAX) 0.25 MG tablet Take 1 tablet (0.25 mg total) by mouth daily as needed for anxiety. 30 tablet 1  . amLODipine (NORVASC) 2.5 MG tablet TAKE ONE TABLET BY MOUTH IN THE MORNING 30 tablet 0  . anastrozole (ARIMIDEX) 1 MG tablet TAKE ONE TABLET BY MOUTH ONCE DAILY. 90 tablet 3  . atorvastatin (LIPITOR) 40 MG tablet TAKE ONE AND ONE-HALF TABLETS BY MOUTH IN THE EVENING 135 tablet 0  . calcium-vitamin D (OSCAL WITH D) 500-200 MG-UNIT per tablet Take 1 tablet by mouth daily with breakfast.     . Cholecalciferol 1000 units TBDP Take 1 tablet by mouth daily. 60 tablet   . dofetilide (TIKOSYN) 250 MCG capsule Take 1 capsule (250 mcg total) by mouth 2 (two)  times daily. 180 capsule 4  . KLOR-CON M20 20 MEQ tablet TAKE ONE TABLET BY MOUTH TWICE DAILY 180 tablet 0  . lisinopril (PRINIVIL,ZESTRIL) 40 MG tablet TAKE ONE TABLET BY MOUTH IN THE MORNING 90 tablet 1  . magnesium oxide (MAG-OX) 400 MG tablet Take 1 tablet (400 mg total) by mouth daily. Take 400 mg twice a day with food    . metFORMIN (GLUCOPHAGE) 500 MG tablet TAKE ONE TABLET BY MOUTH ONCE DAILY 90 tablet 0  . nitroGLYCERIN (NITROSTAT) 0.4 MG SL tablet Place 1 tablet (0.4 mg total) under the tongue every 5 (five) minutes as needed for chest pain. 25 tablet 5  . warfarin (COUMADIN) 5 MG tablet Take 1 tablet daily except on Thursdays take 1 and 1/2 tablets. (Patient taking differently: Take 5 mg by mouth daily. ) 102 tablet 0   No current facility-administered medications for this visit.     OBJECTIVE: Older white woman Who appears stated age  71:   06/06/17 1313  BP: (!) 154/60  Pulse: 71  Resp: 18  Temp: 98.2 F (36.8 C)     Body mass index is 26.85 kg/m.    ECOG FS:1 - Symptomatic but completely ambulatory   Sclerae unicteric, pupils round and equal Oropharynx clear and moist No cervical or supraclavicular adenopathy Lungs no rales or rhonchi Heart regular rate and rhythm Abd soft, nontender, positive bowel sounds MSK no focal spinal tenderness, no upper extremity lymphedema Neuro: nonfocal, well oriented, appropriate affect Breasts: The right breast has undergone lumpectomy, with no evidence of local recurrence. The left breast is benign. Both axillae are benign.   LAB RESULTS:  CMP     Component Value Date/Time   NA 138 11/13/2016 1023   NA 139 08/07/2016 1137   K 5.7 (H) 11/13/2016 1023   K 4.6 08/07/2016 1137   CL 100 11/13/2016 1023   CO2 24 11/13/2016 1023   CO2 25 08/07/2016 1137   GLUCOSE 218 (H) 11/13/2016 1023   GLUCOSE 122 08/07/2016 1137   BUN 14 11/13/2016 1023   BUN 15.2 08/07/2016 1137   CREATININE 0.81 11/13/2016 1023   CREATININE 0.8  08/07/2016 1137   CALCIUM 10.2 11/13/2016 1023   CALCIUM 10.3 08/07/2016 1137   PROT 7.1 11/13/2016 1023   PROT 7.8 08/07/2016 1137   ALBUMIN 3.7 11/13/2016 1023   ALBUMIN  3.3 (L) 08/07/2016 1137   AST 16 11/13/2016 1023   AST 20 08/07/2016 1137   ALT 22 11/13/2016 1023   ALT 17 08/07/2016 1137   ALKPHOS 95 11/13/2016 1023   ALKPHOS 102 08/07/2016 1137   BILITOT 0.4 11/13/2016 1023   BILITOT 0.35 08/07/2016 1137   GFRNONAA 73 11/13/2016 1023   GFRNONAA 80 06/22/2015 1614   GFRAA 84 11/13/2016 1023   GFRAA >89 06/22/2015 1614    I No results found for: SPEP  Lab Results  Component Value Date   WBC 4.7 06/06/2017   NEUTROABS 3.8 06/06/2017   HGB 12.5 06/06/2017   HCT 38.3 06/06/2017   MCV 87.2 06/06/2017   PLT 156 06/06/2017      Chemistry      Component Value Date/Time   NA 138 11/13/2016 1023   NA 139 08/07/2016 1137   K 5.7 (H) 11/13/2016 1023   K 4.6 08/07/2016 1137   CL 100 11/13/2016 1023   CO2 24 11/13/2016 1023   CO2 25 08/07/2016 1137   BUN 14 11/13/2016 1023   BUN 15.2 08/07/2016 1137   CREATININE 0.81 11/13/2016 1023   CREATININE 0.8 08/07/2016 1137      Component Value Date/Time   CALCIUM 10.2 11/13/2016 1023   CALCIUM 10.3 08/07/2016 1137   ALKPHOS 95 11/13/2016 1023   ALKPHOS 102 08/07/2016 1137   AST 16 11/13/2016 1023   AST 20 08/07/2016 1137   ALT 22 11/13/2016 1023   ALT 17 08/07/2016 1137   BILITOT 0.4 11/13/2016 1023   BILITOT 0.35 08/07/2016 1137       No results found for: LABCA2  No components found for: LABCA125   Recent Labs Lab 06/03/17 1003  INR 2.0*    Urinalysis    Component Value Date/Time   COLORURINE YELLOW 09/01/2015 1800   APPEARANCEUR Cloudy (A) 03/21/2016 1344   LABSPEC 1.025 09/01/2015 1800   PHURINE 6.0 09/01/2015 1800   GLUCOSEU Negative 03/21/2016 1344   GLUCOSEU NEGATIVE 04/05/2008 0833   HGBUR SMALL (A) 09/01/2015 1800   BILIRUBINUR Negative 03/21/2016 1344   KETONESUR NEGATIVE 09/01/2015  1800   PROTEINUR Negative 03/21/2016 1344   PROTEINUR TRACE (A) 09/01/2015 1800   UROBILINOGEN negative 10/25/2015 0926   UROBILINOGEN 0.2 09/01/2015 1800   NITRITE Negative 03/21/2016 1344   NITRITE NEGATIVE 09/01/2015 1800   LEUKOCYTESUR 3+ (A) 03/21/2016 1344    STUDIES: Bilateral diagnostic [facility] 05[facility]14/2017 found the breast density to be category B. There was no evidence of malignancy.  ASSESSMENT: 71 y.o. [place] woman status post right breast upper outer quadrant biopsy  01/28/2014 for a clinical T1b N0, stage IA invasive ductal carcinoma, grade 1, estrogen and progesterone receptor positive, HER-2 not amplified, with an MIB-1 of 26%  (1) Biopsy of 2 additional suspicious areas in the right breast 02/03/2014 and 02/10/2014 showed only atypical ductal hyperplasia  (2) status post right lumpectomy and sentinel lymph node sampling 03/01/2014 showed an additional 3 mm area of invasive ductal carcinoma, grade 1, with negative margins. The single sentinel lymph node was negative.  (3) Opted against adjuvant radiation given the marginal benefit of local recurrence  (4) started anastrozole 03/29/2014, interrupted May 2016 with joint swelling; restarted 06/20/15  (a) osteoporosis with T score -3.5 on bone density scan 04/11/2015  PLAN: Izora Gala is now a little over 2 years out from definitive surgery for her breast cancer with no evidence of disease recurrence. This is very favorable.  She is tolerating the anastrozole  well, with no significant side effects and the plan will be to continue that for total of 5 years.  We do not have a copy of her most recent bone density but she tells me it was essentially stable osteoporosis. She has recently increased her calcium and vitamin D intake.  At this point I feel comfortable seeing her on a once a year basis. She will have her next mammogram shortly before that visit. She knows to call for any problems that may develop before her  next visit here.    Chauncey Cruel, MD   06/06/2017 1:19 PM

## 2017-06-13 NOTE — Progress Notes (Signed)
HPI The patient presents for followup of atrial fib.  Since I last saw her She has done well. Her husband has been ill and she's had to do a lot of caregiving.  Marland Kitchen However, despite that she's done well. The patient denies any new symptoms such as chest discomfort, neck or arm discomfort. There has been no new shortness of breath, PND or orthopnea. There have been no reported palpitations, presyncope or syncope.   Allergies  Allergen Reactions  . Miralax [Polyethylene Glycol] Swelling and Rash    Took [facility] brand developed rash. Patient states she tolerated name brand MiraLax in the past.  09/01/15. Patient states she had diffuse swelling including swelling of her lips.   . Vancomycin Rash and Shortness Of Breath  . Acetaminophen Hives  . Oxycodone-Acetaminophen Itching  . Sulfa Antibiotics Rash    All over rash  . Sulfacetamide Sodium Rash    All over rash  . Banana Other (See Comments)    Unknown  . Penicillins Other (See Comments)    Pt had so much as a child she's immune to it  . Latex Rash  . Tape Rash    Current Outpatient Prescriptions  Medication Sig Dispense Refill  . ALPRAZolam (XANAX) 0.25 MG tablet Take 1 tablet (0.25 mg total) by mouth daily as needed for anxiety. 30 tablet 1  . amLODipine (NORVASC) 2.5 MG tablet TAKE ONE TABLET BY MOUTH IN THE MORNING 30 tablet 0  . anastrozole (ARIMIDEX) 1 MG tablet TAKE ONE TABLET BY MOUTH ONCE DAILY. 90 tablet 3  . atorvastatin (LIPITOR) 40 MG tablet TAKE ONE AND ONE-HALF TABLETS BY MOUTH IN THE EVENING 135 tablet 0  . calcium-vitamin D (OSCAL WITH D) 500-200 MG-UNIT per tablet Take 1 tablet by mouth daily with breakfast.     . dofetilide (TIKOSYN) 250 MCG capsule Take 1 capsule (250 mcg total) by mouth 2 (two) times daily. 180 capsule 4  . KLOR-CON M20 20 MEQ tablet TAKE ONE TABLET BY MOUTH TWICE DAILY 180 tablet 0  . lisinopril (PRINIVIL,ZESTRIL) 40 MG tablet TAKE ONE TABLET BY MOUTH IN THE MORNING 90 tablet 1  . magnesium  oxide (MAG-OX) 400 MG tablet Take 1 tablet (400 mg total) by mouth daily. Take 400 mg twice a day with food    . metFORMIN (GLUCOPHAGE) 500 MG tablet TAKE ONE TABLET BY MOUTH ONCE DAILY 90 tablet 0  . nitroGLYCERIN (NITROSTAT) 0.4 MG SL tablet Place 1 tablet (0.4 mg total) under the tongue every 5 (five) minutes as needed for chest pain. 25 tablet 5  . warfarin (COUMADIN) 5 MG tablet Take 1 tablet daily except on Thursdays take 1 and 1/2 tablets. (Patient taking differently: Take 5 mg by mouth daily. ) 102 tablet 0   No current facility-administered medications for this visit.     Past Medical History:  Diagnosis Date  . AAA (abdominal aortic aneurysm) ([place])   . Anxiety   . Arthritis   . Atrial fibrillation ([place])    a. Dx 03/2011 - on tikosyn/coumadin.  Marland Kitchen CAD (coronary artery disease)    a. NSTEMI 02/2011: occ mid Cx, DES to OM2, residual nonobst LAD dz.  . Cancer ([place])    breast  . Diabetes mellitus   . Diverticulitis   . Diverticulitis of colon     2008, 04/2011, 12/2014, 08/2015  . 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 Hemangioma    liver  . HLD (hyperlipidemia)   . HTN (hypertension)   .  Osteoporosis   . Tobacco abuse    stopped smoking 2012  . Ureteral obstruction    History of gross hematuria/right hydronephrosis 2/2 to uteropelvic junction obstruction, s/p cystoscopy in January 2007 with bilateral retrograde pyelography, right ureter arthroscopy, right ureteral stent placement, bladder biopsies, stent removal since then.  . Wears dentures    top    Past Surgical History:  Procedure Laterality Date  . BIOPSY N/A 11/01/2015   Procedure: BIOPSY;  Surgeon: Danie Binder, MD;  Location: [facility];  Service: Endoscopy;  Laterality: N/A;  . BREAST LUMPECTOMY WITH NEEDLE LOCALIZATION AND AXILLARY SENTINEL LYMPH NODE BX Right 03/01/2014   Procedure: BREAST LUMPECTOMY WITH NEEDLE LOCALIZATION AND AXILLARY SENTINEL LYMPH NODE BX;  Surgeon: Edward Jolly, MD;  Location:  [place];  Service: General;  Laterality: Right;  . BREAST SURGERY    . CARDIAC CATHETERIZATION  2012   stent  . COLONOSCOPY WITH PROPOFOL N/A 06/29/2014   Dr. Wynetta Emery: universal diverticulosis  . CORONARY STENT PLACEMENT  2012  . ESOPHAGOGASTRODUODENOSCOPY (EGD) WITH PROPOFOL N/A 11/01/2015   Procedure: ESOPHAGOGASTRODUODENOSCOPY (EGD) WITH PROPOFOL;  Surgeon: Danie Binder, MD;  Location: [facility];  Service: Endoscopy;  Laterality: N/A;  . KNEE ARTHROSCOPY     left  . Right leg surgery  age 41   As a child for  ?scleroderma per patient  . TONSILLECTOMY AND ADENOIDECTOMY    . TUBAL LIGATION    . Ureteral surgery  2011   rt ureterostomy-stent    ROS:  As stated in the HPI and negative for all other systems.  PHYSICAL EXAM BP 136/66   Pulse 63   Ht 5\' 8"  (1.727 m)   Wt 174 lb (78.9 kg)   BMI 26.46 kg/m   GENERAL:  Well appearing NECK:  No jugular venous distention, waveform within normal limits, carotid upstroke brisk and symmetric, no bruits, no thyromegaly LUNGS:  Clear to auscultation bilaterally BACK:  No CVA tenderness CHEST:  Unremarkable HEART:  PMI not displaced or sustained,S1 and S2 within normal limits, no S3, no S4, no clicks, no rubs, no murmurs ABD:  Flat, positive bowel sounds normal in frequency in pitch, no bruits, no rebound, no guarding, no midline pulsatile mass, no hepatomegaly, no splenomegaly EXT:  2 plus pulses throughout, no edema, no cyanosis no clubbing    EKG:  Sinus rhythm, rate 63, axis within normal limits, intervals within normal limits, no acute ST-T wave changes. 06/15/2017   ASSESSMENT AND PLAN  HYPERTENSION -  Her BP has been well controlled.  No change in therapy.   Atrial fibrillation -  She is maintaining sinus rhythm on Tikosyn.  Her QT is normal.  I will check Mg level today.   CAD - The patient has no new sypmtoms.  No further cardiovascular testing is indicated.  We will continue with aggressive risk reduction  and meds as listed.

## 2017-06-14 ENCOUNTER — Ambulatory Visit (INDEPENDENT_AMBULATORY_CARE_PROVIDER_SITE_OTHER): Payer: Medicare Other | Admitting: Cardiology

## 2017-06-14 ENCOUNTER — Encounter: Payer: Self-pay | Admitting: Cardiology

## 2017-06-14 VITALS — BP 136/66 | HR 63 | Ht 68.0 in | Wt 174.0 lb

## 2017-06-14 DIAGNOSIS — I10 Essential (primary) hypertension: Secondary | ICD-10-CM | POA: Diagnosis not present

## 2017-06-14 DIAGNOSIS — I481 Persistent atrial fibrillation: Secondary | ICD-10-CM | POA: Diagnosis not present

## 2017-06-14 DIAGNOSIS — I4891 Unspecified atrial fibrillation: Secondary | ICD-10-CM | POA: Diagnosis not present

## 2017-06-14 DIAGNOSIS — I4819 Other persistent atrial fibrillation: Secondary | ICD-10-CM

## 2017-06-14 NOTE — Patient Instructions (Addendum)
  Medication Instructions:  Your physician recommends that you continue on your current medications as directed. Please refer to the Current Medication list given to you today.  Labwork: Your physician recommends that you return for lab work in: TODAY-MAG  Testing/Procedures: NONE   Follow-Up: Your physician wants you to follow-up in: [place]. You will receive a reminder letter in the mail two months in advance. If you don't receive a letter, please call our office to schedule the follow-up appointment.  Any Other Special Instructions Will Be Listed Below (If Applicable).     If you need a refill on your cardiac medications before your next appointment, please call your pharmacy.

## 2017-06-15 LAB — MAGNESIUM: Magnesium: 2 mg/dL (ref 1.6–2.3)

## 2017-06-23 ENCOUNTER — Other Ambulatory Visit: Payer: Self-pay | Admitting: Cardiology

## 2017-07-10 ENCOUNTER — Ambulatory Visit (INDEPENDENT_AMBULATORY_CARE_PROVIDER_SITE_OTHER): Payer: Medicare Other | Admitting: Pharmacist

## 2017-07-10 DIAGNOSIS — M818 Other osteoporosis without current pathological fracture: Secondary | ICD-10-CM

## 2017-07-10 DIAGNOSIS — Z79899 Other long term (current) drug therapy: Secondary | ICD-10-CM | POA: Diagnosis not present

## 2017-07-10 DIAGNOSIS — I481 Persistent atrial fibrillation: Secondary | ICD-10-CM | POA: Diagnosis not present

## 2017-07-10 DIAGNOSIS — I4819 Other persistent atrial fibrillation: Secondary | ICD-10-CM

## 2017-07-10 LAB — COAGUCHEK XS/INR WAIVED
INR: 2.4 — AB (ref 0.9–1.1)
Prothrombin Time: 28.4 s

## 2017-07-10 MED ORDER — ALENDRONATE SODIUM 70 MG PO TABS: 70.0000 mg | ORAL_TABLET | ORAL | 11 refills | Status: DC

## 2017-07-10 NOTE — Patient Instructions (Signed)
Anticoagulation Warfarin Dose Instructions as of 07/10/2017      Melody Casey Tue Wed Thu Fri Sat   New Dose 5 mg 5 mg 5 mg 5 mg 5 mg 5 mg 5 mg    Description   Continue current warfarin 5mg  dose - take 1 tablet daily  INR was 2.4 today    I will send prescription in to [facility] for alendroante

## 2017-07-10 NOTE — Progress Notes (Signed)
Patient ID: SYVANNA CIOLINO, female   DOB: September 18, 1946, 71 y.o.   MRN: [id number]   Subjective:     Indication: atrial fibrillation Bleeding signs/symptoms: None Thromboembolic signs/symptoms: None  Missed Coumadin doses: None Medication changes: no Dietary changes: no Bacterial/viral infection: no Other concerns: yes - patient has osteoporosis and is taking anastrozole post breast cancer.  Her last DEXA revealed low BMD however she did not want to start medication until after consultation with her oncologist.  Dr Jana Hakim has been consulted and he is in favor of starting bisphosphonate.   patient is taking calcium +D 500mg  daily and also eats calcium rich foods.    Objective:    INR Today: 2.4 Current dose: warfarin 5mg  take 1 tablet daily    Assessment:    Therapeutic INR for goal of 2-3   Plan:    1. New dose: no change   2. Next INR: 4 to 6 weeks - appt made with PCP 3.  Start alendroante 70mg  take 1 tablet weekly on empty stomach with at least 4 ounces of water.  4.  Continue calcium 500mg  qd and dietary calcium intake.

## 2017-07-28 ENCOUNTER — Other Ambulatory Visit: Payer: Self-pay | Admitting: Cardiology

## 2017-07-29 NOTE — Telephone Encounter (Signed)
Rx(s) sent to pharmacy electronically.  

## 2017-08-18 ENCOUNTER — Other Ambulatory Visit: Payer: Self-pay | Admitting: Family Medicine

## 2017-08-19 NOTE — Telephone Encounter (Signed)
Next OV 08/27/17

## 2017-08-25 ENCOUNTER — Other Ambulatory Visit: Payer: Self-pay | Admitting: Family Medicine

## 2017-08-27 ENCOUNTER — Ambulatory Visit (INDEPENDENT_AMBULATORY_CARE_PROVIDER_SITE_OTHER): Payer: Medicare Other | Admitting: Family Medicine

## 2017-08-27 ENCOUNTER — Encounter: Payer: Self-pay | Admitting: Family Medicine

## 2017-08-27 VITALS — BP 105/62 | HR 62 | Temp 97.1°F | Ht 68.0 in | Wt 177.4 lb

## 2017-08-27 DIAGNOSIS — I4891 Unspecified atrial fibrillation: Secondary | ICD-10-CM | POA: Diagnosis not present

## 2017-08-27 DIAGNOSIS — M810 Age-related osteoporosis without current pathological fracture: Secondary | ICD-10-CM | POA: Diagnosis not present

## 2017-08-27 DIAGNOSIS — I4819 Other persistent atrial fibrillation: Secondary | ICD-10-CM

## 2017-08-27 DIAGNOSIS — I10 Essential (primary) hypertension: Secondary | ICD-10-CM | POA: Diagnosis not present

## 2017-08-27 DIAGNOSIS — Z79899 Other long term (current) drug therapy: Secondary | ICD-10-CM

## 2017-08-27 DIAGNOSIS — I481 Persistent atrial fibrillation: Secondary | ICD-10-CM

## 2017-08-27 LAB — COAGUCHEK XS/INR WAIVED
INR: 2.6 — AB (ref 0.9–1.1)
Prothrombin Time: 30.9 s

## 2017-08-27 NOTE — Progress Notes (Addendum)
   HPI  Patient presents today here for follow-up chronic medical conditions.  Atrial fibrillation, anticoagulation No unusual bleeding, numbness doses. No palpitations or chest pain.  Patient with newly diagnosed osteoporosis, however did not want to try alendronate, she will consider.  Hypertension Good medication compliance No chest pain or headaches.  PMH: Smoking status noted ROS: Per HPI  Objective: BP 105/62   Pulse 62   Temp (!) 97.1 F (36.2 C) (Oral)   Ht 5\' 8"  (1.727 m)   Wt 177 lb 6.4 oz (80.5 kg)   BMI 26.97 kg/m  Gen: NAD, alert, cooperative with exam HEENT: NCAT CV: RRR, good S1/S2, no murmur Resp: CTABL, no wheezes, non-labored Ext: No edema, warm Neuro: Alert and oriented, No gross deficits  Assessment and plan:  # Atrial fibrillation, anticoagulation INR is therapeutic, no changes Rhythm controlled on tikosyn per cardiology  Anticoagulation Warfarin Dose Instructions as of 08/27/2017      Dorene Grebe Tue Wed Thu Fri Sat   New Dose 5 mg 5 mg 5 mg 5 mg 5 mg 5 mg 5 mg    Description   Continue current warfarin 5mg  dose - take 1 tablet daily        # Hypertension Well-controlled on current medications, changing  # Osteoporosis Discussed present cons of alendronate, recommended trying the medication     Orders Placed This Encounter  Procedures  . CoaguChek XS/INR Carney Bern, MD [facility] 08/27/2017, 12:54 PM

## 2017-09-01 ENCOUNTER — Other Ambulatory Visit: Payer: Self-pay | Admitting: Cardiology

## 2017-09-02 NOTE — Telephone Encounter (Signed)
Rx has been sent to the pharmacy electronically. ° °

## 2017-09-11 ENCOUNTER — Other Ambulatory Visit: Payer: Self-pay | Admitting: Family Medicine

## 2017-09-11 ENCOUNTER — Telehealth: Payer: Self-pay | Admitting: Family Medicine

## 2017-09-11 DIAGNOSIS — K5792 Diverticulitis of intestine, part unspecified, without perforation or abscess without bleeding: Secondary | ICD-10-CM

## 2017-09-12 ENCOUNTER — Telehealth: Payer: Self-pay | Admitting: Family Medicine

## 2017-09-12 DIAGNOSIS — M810 Age-related osteoporosis without current pathological fracture: Secondary | ICD-10-CM | POA: Diagnosis not present

## 2017-09-12 DIAGNOSIS — Z170 Estrogen receptor positive status [ER+]: Secondary | ICD-10-CM | POA: Diagnosis not present

## 2017-09-12 DIAGNOSIS — M79641 Pain in right hand: Secondary | ICD-10-CM | POA: Diagnosis not present

## 2017-09-12 DIAGNOSIS — M79642 Pain in left hand: Secondary | ICD-10-CM | POA: Diagnosis not present

## 2017-09-12 DIAGNOSIS — M0579 Rheumatoid arthritis with rheumatoid factor of multiple sites without organ or systems involvement: Secondary | ICD-10-CM | POA: Diagnosis not present

## 2017-09-12 DIAGNOSIS — I4891 Unspecified atrial fibrillation: Secondary | ICD-10-CM | POA: Diagnosis not present

## 2017-09-12 DIAGNOSIS — E663 Overweight: Secondary | ICD-10-CM | POA: Diagnosis not present

## 2017-09-12 DIAGNOSIS — C50911 Malignant neoplasm of unspecified site of right female breast: Secondary | ICD-10-CM | POA: Diagnosis not present

## 2017-09-12 DIAGNOSIS — Z6826 Body mass index (BMI) 26.0-26.9, adult: Secondary | ICD-10-CM | POA: Diagnosis not present

## 2017-09-12 DIAGNOSIS — I2510 Atherosclerotic heart disease of native coronary artery without angina pectoris: Secondary | ICD-10-CM | POA: Diagnosis not present

## 2017-09-12 DIAGNOSIS — R5383 Other fatigue: Secondary | ICD-10-CM | POA: Diagnosis not present

## 2017-09-19 ENCOUNTER — Encounter: Payer: Self-pay | Admitting: Family Medicine

## 2017-09-19 ENCOUNTER — Ambulatory Visit (INDEPENDENT_AMBULATORY_CARE_PROVIDER_SITE_OTHER): Payer: Medicare Other | Admitting: Family Medicine

## 2017-09-19 VITALS — BP 129/77 | HR 57 | Temp 96.8°F | Ht 68.0 in | Wt 173.0 lb

## 2017-09-19 DIAGNOSIS — K5792 Diverticulitis of intestine, part unspecified, without perforation or abscess without bleeding: Secondary | ICD-10-CM | POA: Diagnosis not present

## 2017-09-19 MED ORDER — CIPROFLOXACIN HCL 500 MG PO TABS: 500.0000 mg | ORAL_TABLET | Freq: Two times a day (BID) | ORAL | 0 refills | Status: DC

## 2017-09-19 MED ORDER — METRONIDAZOLE 500 MG PO TABS: 500.0000 mg | ORAL_TABLET | Freq: Two times a day (BID) | ORAL | 0 refills | Status: DC

## 2017-09-19 NOTE — Progress Notes (Signed)
BP 129/77   Pulse (!) 57   Temp (!) 96.8 F (36 C) (Oral)   Ht 5\' 8"  (1.727 m)   Wt 173 lb (78.5 kg)   BMI 26.30 kg/m    Subjective:    Patient ID: Melody Casey, female    DOB: 1946-06-13, 71 y.o.   MRN: [id number]  HPI: Melody Casey is a 71 y.o. female presenting on 09/19/2017 for Diverticulitis (had 3 days of Cipro & Flagyl at home, needs med filled so she can finish out treatment)   HPI Left lower abdominal pain and diarrhea Patient comes in complaining of 6 days worth of left lower abdominal pain that is crampy and denies any radiation. She has had a couple episodes of severe diarrhea over the past few days. She denies any blood in her stool. She denies any fevers or chills. She denies any radiation of the abdominal pain. She rates as a 5 out of 10.  Relevant past medical, surgical, family and social history reviewed and updated as indicated. Interim medical history since our last visit reviewed. Allergies and medications reviewed and updated.  Review of Systems  Constitutional: Negative for chills and fever.  Eyes: Negative for redness and visual disturbance.  Respiratory: Negative for chest tightness and shortness of breath.   Cardiovascular: Negative for chest pain and leg swelling.  Gastrointestinal: Positive for abdominal pain, diarrhea and nausea. Negative for constipation and vomiting.  Genitourinary: Negative for dysuria.  Musculoskeletal: Negative for back pain and gait problem.  Skin: Negative for rash.  Neurological: Negative for light-headedness and headaches.  Psychiatric/Behavioral: Negative for agitation and behavioral problems.  All other systems reviewed and are negative.   Per HPI unless specifically indicated above     Objective:    BP 129/77   Pulse (!) 57   Temp (!) 96.8 F (36 C) (Oral)   Ht 5\' 8"  (1.727 m)   Wt 173 lb (78.5 kg)   BMI 26.30 kg/m   Wt Readings from Last 3 Encounters:  09/19/17 173 lb (78.5 kg)  08/27/17 177 lb 6.4 oz  (80.5 kg)  06/14/17 174 lb (78.9 kg)    Physical Exam  Constitutional: She is oriented to person, place, and time. She appears well-developed and well-nourished. No distress.  Eyes: Conjunctivae are normal.  Neck: Neck supple. No thyromegaly present.  Cardiovascular: Normal rate, regular rhythm, normal heart sounds and intact distal pulses.   No murmur heard. Pulmonary/Chest: Effort normal and breath sounds normal. No respiratory distress. She has no wheezes. She has no rales.  Abdominal: Soft. Bowel sounds are normal. She exhibits no distension. There is no hepatosplenomegaly. There is tenderness in the left lower quadrant. There is no rigidity, no rebound, no guarding and no CVA tenderness.  Musculoskeletal: Normal range of motion. She exhibits no edema or tenderness.  Lymphadenopathy:    She has no cervical adenopathy.  Neurological: She is alert and oriented to person, place, and time. Coordination normal.  Skin: Skin is warm and dry. No rash noted. She is not diaphoretic.  Psychiatric: She has a normal mood and affect. Her behavior is normal.  Nursing note and vitals reviewed.   Anticoagulation Warfarin Dose Instructions as of 09/19/2017      Dorene Grebe Tue Wed Thu Fri Sat   New Dose 2.5 mg 2.5 mg 2.5 mg 2.5 mg 2.5 mg 2.5 mg 2.5 mg   Alt Week 5 mg 5 mg 5 mg 5 mg 5 mg 5 mg 5 mg  Description   Take one half tablet every day while you are on the antibiotics and then Continue current warfarin 5mg  dose - take 1 tablet daily          Assessment & Plan:   Problem List Items Addressed This Visit    None    Visit Diagnoses    Diverticulitis    -  Primary   Relevant Medications   ciprofloxacin (CIPRO) 500 MG tablet   metroNIDAZOLE (FLAGYL) 500 MG tablet       Follow up plan: Return if symptoms worsen or fail to improve, for Come back in 2 weeks for Coumadin recheck with Erasmo Downer or with Sharyn Lull.  Counseling provided for all of the vaccine components No orders of the  defined types were placed in this encounter.   [name], MD [facility] 09/19/2017, 12:00 PM

## 2017-09-19 NOTE — Patient Instructions (Signed)
Anticoagulation Warfarin Dose Instructions as of 09/19/2017      Dorene Grebe Tue Wed Thu Fri Sat   New Dose 2.5 mg 2.5 mg 2.5 mg 2.5 mg 2.5 mg 2.5 mg 2.5 mg   Alt Week 5 mg 5 mg 5 mg 5 mg 5 mg 5 mg 5 mg    Description   Take one half tablet every day while you are on the antibiotics and then Continue current warfarin 5mg  dose - take 1 tablet daily

## 2017-09-24 ENCOUNTER — Encounter: Payer: Medicare Other | Admitting: *Deleted

## 2017-09-24 ENCOUNTER — Encounter: Payer: Self-pay | Admitting: Family Medicine

## 2017-10-03 DIAGNOSIS — B351 Tinea unguium: Secondary | ICD-10-CM | POA: Diagnosis not present

## 2017-10-03 DIAGNOSIS — M79676 Pain in unspecified toe(s): Secondary | ICD-10-CM | POA: Diagnosis not present

## 2017-10-03 DIAGNOSIS — E1151 Type 2 diabetes mellitus with diabetic peripheral angiopathy without gangrene: Secondary | ICD-10-CM | POA: Diagnosis not present

## 2017-10-03 DIAGNOSIS — L84 Corns and callosities: Secondary | ICD-10-CM | POA: Diagnosis not present

## 2017-10-08 ENCOUNTER — Encounter: Payer: Medicare Other | Admitting: *Deleted

## 2017-10-08 ENCOUNTER — Ambulatory Visit (INDEPENDENT_AMBULATORY_CARE_PROVIDER_SITE_OTHER): Payer: Medicare Other | Admitting: *Deleted

## 2017-10-08 DIAGNOSIS — Z79899 Other long term (current) drug therapy: Secondary | ICD-10-CM | POA: Diagnosis not present

## 2017-10-08 DIAGNOSIS — I481 Persistent atrial fibrillation: Secondary | ICD-10-CM

## 2017-10-08 DIAGNOSIS — I4819 Other persistent atrial fibrillation: Secondary | ICD-10-CM

## 2017-10-08 NOTE — Patient Instructions (Signed)
Anticoagulation Warfarin Dose Instructions as of 10/08/2017      Melody Casey Tue Wed Thu Fri Sat   New Dose 5 mg 5 mg 5 mg 5 mg 5 mg 5 mg 5 mg    Description   Take 1 tablet daily.   Your INR was 1.6 today which is too thick.  Your goal is 2.0-3.0  Return on 10/22/17 at 1:15

## 2017-10-09 LAB — COAGUCHEK XS/INR WAIVED
INR: 1.6 — AB (ref 0.9–1.1)
PROTHROMBIN TIME: 19.3 s

## 2017-10-22 ENCOUNTER — Encounter: Payer: Medicare Other | Admitting: *Deleted

## 2017-10-23 NOTE — Progress Notes (Signed)
Subjective:     Indication: aortic valve regurgitation Bleeding signs/symptoms: None Thromboembolic signs/symptoms: None  Missed Coumadin doses: None Medication changes: yes - given Cipro on 09/19/17 Dietary changes: no Bacterial/viral infection: diverticulitis Other concerns: no  The following portions of the patient's history were reviewed and updated as appropriate: allergies and current medications.  Review of Systems Pertinent items are noted in HPI.   Objective:    INR Today: 1.8 Current dose: 2.5 mg dailywhile taking Cipro and then back to normal schedule of 5mg  daily.   Assessment:    Subtherapeutic INR for goal of 2-3   Plan:    1. New dose: no change - returned to current dose of 5mg  daily 2. Next INR: 2 weeks    Chong Sicilian, RN

## 2017-10-28 ENCOUNTER — Other Ambulatory Visit: Payer: Self-pay | Admitting: Family Medicine

## 2017-10-28 ENCOUNTER — Ambulatory Visit (INDEPENDENT_AMBULATORY_CARE_PROVIDER_SITE_OTHER): Payer: Medicare Other | Admitting: *Deleted

## 2017-10-28 ENCOUNTER — Encounter: Payer: Medicare Other | Admitting: *Deleted

## 2017-10-28 DIAGNOSIS — R5383 Other fatigue: Secondary | ICD-10-CM | POA: Diagnosis not present

## 2017-10-28 DIAGNOSIS — Z23 Encounter for immunization: Secondary | ICD-10-CM | POA: Diagnosis not present

## 2017-10-28 DIAGNOSIS — I481 Persistent atrial fibrillation: Secondary | ICD-10-CM

## 2017-10-28 DIAGNOSIS — Z6825 Body mass index (BMI) 25.0-25.9, adult: Secondary | ICD-10-CM | POA: Diagnosis not present

## 2017-10-28 DIAGNOSIS — Z79899 Other long term (current) drug therapy: Secondary | ICD-10-CM

## 2017-10-28 DIAGNOSIS — C50911 Malignant neoplasm of unspecified site of right female breast: Secondary | ICD-10-CM | POA: Diagnosis not present

## 2017-10-28 DIAGNOSIS — I4819 Other persistent atrial fibrillation: Secondary | ICD-10-CM

## 2017-10-28 DIAGNOSIS — I4891 Unspecified atrial fibrillation: Secondary | ICD-10-CM | POA: Diagnosis not present

## 2017-10-28 DIAGNOSIS — M810 Age-related osteoporosis without current pathological fracture: Secondary | ICD-10-CM | POA: Diagnosis not present

## 2017-10-28 DIAGNOSIS — E663 Overweight: Secondary | ICD-10-CM | POA: Diagnosis not present

## 2017-10-28 DIAGNOSIS — M0579 Rheumatoid arthritis with rheumatoid factor of multiple sites without organ or systems involvement: Secondary | ICD-10-CM | POA: Diagnosis not present

## 2017-10-28 DIAGNOSIS — I2510 Atherosclerotic heart disease of native coronary artery without angina pectoris: Secondary | ICD-10-CM | POA: Diagnosis not present

## 2017-10-28 LAB — COAGUCHEK XS/INR WAIVED
INR: 3.1 — ABNORMAL HIGH (ref 0.9–1.1)
PROTHROMBIN TIME: 36.9 s

## 2017-10-29 NOTE — Progress Notes (Signed)
Subjective:     Indication: atrial fibrillation Bleeding signs/symptoms: None Thromboembolic signs/symptoms: None  Missed Coumadin doses: None Medication changes: no Dietary changes: no Bacterial/viral infection: no Other concerns: no  The following portions of the patient's history were reviewed and updated as appropriate: allergies and current medications.  Review of Systems Pertinent items are noted in HPI.   Objective:    INR Today: 3.1 Current dose: 5 mg daily    Assessment:    Supratherapeutic INR for goal of 2-3   Plan:    1. New dose: no change   2. Next INR: 1 week    Discussed with Dr Wendi Snipes. No change but will rck at a closer interval.   Chong Sicilian, RN

## 2017-11-08 DIAGNOSIS — H6123 Impacted cerumen, bilateral: Secondary | ICD-10-CM | POA: Diagnosis not present

## 2017-11-08 DIAGNOSIS — H903 Sensorineural hearing loss, bilateral: Secondary | ICD-10-CM | POA: Diagnosis not present

## 2017-11-12 ENCOUNTER — Ambulatory Visit (INDEPENDENT_AMBULATORY_CARE_PROVIDER_SITE_OTHER): Payer: Medicare Other | Admitting: *Deleted

## 2017-11-12 DIAGNOSIS — Z79899 Other long term (current) drug therapy: Secondary | ICD-10-CM | POA: Diagnosis not present

## 2017-11-12 DIAGNOSIS — I481 Persistent atrial fibrillation: Secondary | ICD-10-CM | POA: Diagnosis not present

## 2017-11-12 DIAGNOSIS — I4819 Other persistent atrial fibrillation: Secondary | ICD-10-CM

## 2017-11-12 LAB — COAGUCHEK XS/INR WAIVED
INR: 1.8 — AB (ref 0.9–1.1)
PROTHROMBIN TIME: 22.2 s

## 2017-11-12 NOTE — Progress Notes (Signed)
Subjective:     Indication: atrial fibrillation Bleeding signs/symptoms: None Thromboembolic signs/symptoms: None  Missed Coumadin doses: None Medication changes: no Dietary changes: no Bacterial/viral infection: no Other concerns: no  The following portions of the patient's history were reviewed and updated as appropriate: allergies and current medications.  Review of Systems Pertinent items are noted in HPI.   Objective:    INR Today: 1.8 Current dose: 5 mg daily    Assessment:    Subtherapeutic INR for goal of 2-3   Plan:    1. New dose: no change   2. Next INR: 2 weeks    Discussed with Dr Wendi Snipes  Chong Sicilian, RN

## 2017-11-15 ENCOUNTER — Telehealth: Payer: Self-pay | Admitting: Pharmacist

## 2017-11-15 NOTE — Telephone Encounter (Signed)
Pt left message on machine for [employer] paperwork and need for renewal. She requests a call back from [place] at [phone].

## 2017-11-17 ENCOUNTER — Other Ambulatory Visit: Payer: Self-pay | Admitting: Cardiology

## 2017-11-17 ENCOUNTER — Other Ambulatory Visit: Payer: Self-pay | Admitting: Family Medicine

## 2017-11-19 NOTE — Telephone Encounter (Signed)
Last appointment for DM with you on 08/27/17

## 2017-11-20 ENCOUNTER — Other Ambulatory Visit: Payer: Self-pay | Admitting: Pharmacist Clinician (PhC)/ Clinical Pharmacy Specialist

## 2017-11-20 MED ORDER — DOFETILIDE 250 MCG PO CAPS: 250.0000 ug | ORAL_CAPSULE | Freq: Two times a day (BID) | ORAL | 5 refills | Status: DC

## 2017-11-20 NOTE — Telephone Encounter (Signed)
Tikosyn reordered.   Also printed out paperwork for continuation of free program.  Did advise patient that medication is now generic and may be available for reasonable price thru her local pharmacy.  Sent Rx to [facility] for her to check on price.

## 2017-11-25 ENCOUNTER — Other Ambulatory Visit: Payer: Self-pay | Admitting: Pharmacist

## 2017-11-25 ENCOUNTER — Encounter: Payer: Self-pay | Admitting: Pharmacist

## 2017-11-25 NOTE — Telephone Encounter (Signed)
LMOM; Patient assistance medication received today 11/25/17  Tikosyn 280mcg capsules; 60 cap x 3 bottles

## 2017-11-25 NOTE — Telephone Encounter (Signed)
This encounter was created in error - please disregard.

## 2017-12-05 ENCOUNTER — Ambulatory Visit (INDEPENDENT_AMBULATORY_CARE_PROVIDER_SITE_OTHER): Payer: Medicare Other | Admitting: *Deleted

## 2017-12-05 DIAGNOSIS — Z79899 Other long term (current) drug therapy: Secondary | ICD-10-CM

## 2017-12-05 DIAGNOSIS — I481 Persistent atrial fibrillation: Secondary | ICD-10-CM

## 2017-12-05 DIAGNOSIS — I4819 Other persistent atrial fibrillation: Secondary | ICD-10-CM

## 2017-12-05 LAB — COAGUCHEK XS/INR WAIVED
INR: 1.7 — ABNORMAL HIGH (ref 0.9–1.1)
Prothrombin Time: 20.5 s

## 2017-12-05 NOTE — Progress Notes (Signed)
Subjective:     Indication: atrial fibrillation Bleeding signs/symptoms: None Thromboembolic signs/symptoms: None  Missed Coumadin doses: This week - missed one dose but can't remember which day Medication changes: no Dietary changes: no Bacterial/viral infection: no Other concerns: no  The following portions of the patient's history were reviewed and updated as appropriate: allergies and current medications.  Review of Systems Pertinent items are noted in HPI.   Objective:    INR Today: 1.7 Current dose: 5 mg daily    Assessment:    Subtherapeutic INR for goal of 2-3   Plan:    1. New dose: 7.5mg  on Thurs and 5mg  all other days   2. Next INR: 1 month    Discussed with Evelina Dun, FNP  Chong Sicilian, RN

## 2017-12-12 ENCOUNTER — Encounter: Payer: Self-pay | Admitting: Nurse Practitioner

## 2017-12-12 ENCOUNTER — Ambulatory Visit (INDEPENDENT_AMBULATORY_CARE_PROVIDER_SITE_OTHER): Payer: Medicare Other | Admitting: Nurse Practitioner

## 2017-12-12 VITALS — BP 114/68 | Temp 97.5°F | Ht 68.0 in | Wt 171.0 lb

## 2017-12-12 DIAGNOSIS — B351 Tinea unguium: Secondary | ICD-10-CM | POA: Diagnosis not present

## 2017-12-12 DIAGNOSIS — L84 Corns and callosities: Secondary | ICD-10-CM | POA: Diagnosis not present

## 2017-12-12 DIAGNOSIS — M79676 Pain in unspecified toe(s): Secondary | ICD-10-CM | POA: Diagnosis not present

## 2017-12-12 DIAGNOSIS — K5732 Diverticulitis of large intestine without perforation or abscess without bleeding: Secondary | ICD-10-CM | POA: Diagnosis not present

## 2017-12-12 DIAGNOSIS — E1151 Type 2 diabetes mellitus with diabetic peripheral angiopathy without gangrene: Secondary | ICD-10-CM | POA: Diagnosis not present

## 2017-12-12 MED ORDER — METRONIDAZOLE 500 MG PO TABS: 500.0000 mg | ORAL_TABLET | Freq: Two times a day (BID) | ORAL | 0 refills | Status: DC

## 2017-12-12 MED ORDER — CIPROFLOXACIN HCL 500 MG PO TABS: 500.0000 mg | ORAL_TABLET | Freq: Two times a day (BID) | ORAL | 0 refills | Status: DC

## 2017-12-12 NOTE — Patient Instructions (Signed)

## 2017-12-12 NOTE — Progress Notes (Signed)
   Subjective:    Patient ID: Melody Casey, female    DOB: 1946/12/05, 72 y.o.   MRN: [id number]  HPI Patient come sin today c/o left lower quadrant pain that started several days ago. Pain is the pain she usually has when she has a flare up of diverticulitis. Has had some night sweats but no fever.   Review of Systems  Constitutional: Negative.   HENT: Negative.   Respiratory: Negative.   Cardiovascular: Negative.   Gastrointestinal: Positive for abdominal pain and constipation. Negative for diarrhea, nausea and vomiting.  Genitourinary: Negative.   Neurological: Negative.   Psychiatric/Behavioral: Negative.   All other systems reviewed and are negative.      Objective:   Physical Exam  Constitutional: She is oriented to person, place, and time. She appears well-developed and well-nourished. No distress.  Cardiovascular: Normal rate and regular rhythm.  Pulmonary/Chest: Effort normal and breath sounds normal.  Abdominal: Soft. Bowel sounds are normal. There is tenderness (left lower quadrant pain on palpation).  Neurological: She is alert and oriented to person, place, and time.  Skin: Skin is warm.  Psychiatric: She has a normal mood and affect. Her behavior is normal. Judgment and thought content normal.   BP 114/68 (BP Location: Left Arm, Patient Position: Sitting, Cuff Size: Normal)   Temp (!) 97.5 F (36.4 C) (Oral)   Ht 5\' 8"  (1.727 m)   Wt 171 lb (77.6 kg)   BMI 26.00 kg/m        Assessment & Plan:  1. Diverticulitis large intestine w/o perforation or abscess w/o bleeding Watch diet Take tramadol if needed for pain - metroNIDAZOLE (FLAGYL) 500 MG tablet; Take 1 tablet (500 mg total) by mouth 2 (two) times daily.  Dispense: 20 tablet; Refill: 0 - ciprofloxacin (CIPRO) 500 MG tablet; Take 1 tablet (500 mg total) by mouth 2 (two) times daily.  Dispense: 20 tablet; Refill: 0  Mary-Margaret Hassell Done, FNP

## 2017-12-13 ENCOUNTER — Telehealth: Payer: Self-pay

## 2017-12-13 NOTE — Telephone Encounter (Signed)
Spoke with Dr Evette Doffing and she advised to take 1/2 tab coumadin (2.5mg ) daily and recheck this Wednesday. Patient notified and appt given. Called [facility] on [address] and advised them that coumadin issue had been addressed and to go ahead and fill patients rx

## 2017-12-13 NOTE — Telephone Encounter (Signed)
Received fax from [facility] about Cipro that was sent in yesterday by [facility]MM. They wanted to make sure that we are aware that this may increase warfarin. Please advise and route to Pool B so nurse can phone pharmacy at [phone]

## 2017-12-18 ENCOUNTER — Ambulatory Visit (INDEPENDENT_AMBULATORY_CARE_PROVIDER_SITE_OTHER): Payer: Medicare Other | Admitting: *Deleted

## 2017-12-18 DIAGNOSIS — I481 Persistent atrial fibrillation: Secondary | ICD-10-CM

## 2017-12-18 DIAGNOSIS — Z79899 Other long term (current) drug therapy: Secondary | ICD-10-CM

## 2017-12-18 DIAGNOSIS — I4819 Other persistent atrial fibrillation: Secondary | ICD-10-CM

## 2017-12-18 LAB — COAGUCHEK XS/INR WAIVED
INR: 2.1 — ABNORMAL HIGH (ref 0.9–1.1)
Prothrombin Time: 24.8 s

## 2017-12-18 NOTE — Progress Notes (Signed)
Subjective:     Indication: atrial fibrillation Bleeding signs/symptoms: Yes - Patient noticed some blood after urinating on 12/15/17.  Thromboembolic signs/symptoms: No Missed Coumadin doses: None Medication changes: yes - Flagyl and Cipro started on 12/14/17 for diverticulitis Dietary changes: no Bacterial/viral infection: yes - diverticulitis dx on 12/13/17 Other concerns: no  The following portions of the patient's history were reviewed and updated as appropriate: allergies and current medications.  Review of Systems Urinary: Urine is still a little darker than normal but no gross hematuria. Denies any urinary frequency, urgency, dysuria, abd pain, or back pain. She is drinking plenty of water.    Objective:    INR Today: 2.1 Current dose: 2.5mg  daily since starting antibiotics on 12/14/17  Assessment:    Therapeutic INR for goal of 2-3   Plan:    1. New dose: Continue 2.5mg  for 5 more days until antibiotics are complete. Resume normal dose of 5mg  daily after that   2. Next INR: 2 weeks    Chong Sicilian, RN

## 2017-12-27 ENCOUNTER — Telehealth: Payer: Self-pay | Admitting: Cardiology

## 2017-12-27 NOTE — Telephone Encounter (Signed)
Hey need the quanity of her Tikosyn.Please fax them to [phone].

## 2017-12-30 ENCOUNTER — Other Ambulatory Visit: Payer: Self-pay | Admitting: Pharmacist Clinician (PhC)/ Clinical Pharmacy Specialist

## 2017-12-30 MED ORDER — DOFETILIDE 250 MCG PO CAPS: 250.0000 ug | ORAL_CAPSULE | Freq: Two times a day (BID) | ORAL | 3 refills | Status: DC

## 2017-12-30 NOTE — Telephone Encounter (Signed)
Faxed request for 180 tabs, refill x 3

## 2018-01-01 ENCOUNTER — Ambulatory Visit (INDEPENDENT_AMBULATORY_CARE_PROVIDER_SITE_OTHER): Payer: Medicare Other | Admitting: *Deleted

## 2018-01-01 DIAGNOSIS — Z79899 Other long term (current) drug therapy: Secondary | ICD-10-CM | POA: Diagnosis not present

## 2018-01-01 DIAGNOSIS — I481 Persistent atrial fibrillation: Secondary | ICD-10-CM | POA: Diagnosis not present

## 2018-01-01 DIAGNOSIS — I4819 Other persistent atrial fibrillation: Secondary | ICD-10-CM

## 2018-01-01 LAB — COAGUCHEK XS/INR WAIVED
INR: 1 (ref 0.9–1.1)
PROTHROMBIN TIME: 12.2 s

## 2018-01-02 NOTE — Progress Notes (Signed)
Subjective:     Indication: atrial fibrillation Bleeding signs/symptoms: None Thromboembolic signs/symptoms: None  Missed Coumadin doses: None Medication changes: yes - Completed Cipro and Flagyl about 10 days ago. She was to return to previous dose of 5mg  daily after completing antibiotic but she continued to take 2.5mg  until her visit yesterday.  Dietary changes: no Bacterial/viral infection: no Other concerns: no  The following portions of the patient's history were reviewed and updated as appropriate: allergies and current medications.  Review of Systems Pertinent items are noted in HPI.   Objective:    INR Today: 1.0 Current dose:2.5mg   Assessment:    Subtherapeutic INR for goal of 2-3   Plan:    1. New dose: Return to previous dose of 5mg  daily   2. Next INR: 2 weeks    Discussed with Dr Wendi Snipes  Chong Sicilian, RN

## 2018-01-06 ENCOUNTER — Encounter: Payer: Medicare Other | Admitting: *Deleted

## 2018-01-14 ENCOUNTER — Telehealth: Payer: Self-pay | Admitting: Cardiology

## 2018-01-14 MED ORDER — DOFETILIDE 250 MCG PO CAPS: 250.0000 ug | ORAL_CAPSULE | Freq: Two times a day (BID) | ORAL | 3 refills | Status: DC

## 2018-01-14 NOTE — Telephone Encounter (Signed)
New message     Please send new prescription for dofetilide (TIKOSYN) 250 MCG capsule per [employer] [facility]. Quantity must be on a script or form. Can not be faxed over on cover sheet.  Phone # [phone] Fax  [phone]

## 2018-01-15 NOTE — Telephone Encounter (Signed)
Faxed signed rx of Tikosyn to [facility]

## 2018-01-16 ENCOUNTER — Encounter: Payer: Self-pay | Admitting: Family Medicine

## 2018-01-16 ENCOUNTER — Ambulatory Visit (INDEPENDENT_AMBULATORY_CARE_PROVIDER_SITE_OTHER): Payer: Medicare Other | Admitting: Family Medicine

## 2018-01-16 VITALS — BP 127/63 | HR 74 | Temp 97.9°F | Ht 68.0 in | Wt 170.6 lb

## 2018-01-16 DIAGNOSIS — I481 Persistent atrial fibrillation: Secondary | ICD-10-CM | POA: Diagnosis not present

## 2018-01-16 DIAGNOSIS — E785 Hyperlipidemia, unspecified: Secondary | ICD-10-CM

## 2018-01-16 DIAGNOSIS — E119 Type 2 diabetes mellitus without complications: Secondary | ICD-10-CM

## 2018-01-16 DIAGNOSIS — I4819 Other persistent atrial fibrillation: Secondary | ICD-10-CM

## 2018-01-16 DIAGNOSIS — I10 Essential (primary) hypertension: Secondary | ICD-10-CM | POA: Diagnosis not present

## 2018-01-16 DIAGNOSIS — Z79899 Other long term (current) drug therapy: Secondary | ICD-10-CM

## 2018-01-16 LAB — COAGUCHEK XS/INR WAIVED
INR: 1.7 — ABNORMAL HIGH (ref 0.9–1.1)
Prothrombin Time: 20.5 s

## 2018-01-16 MED ORDER — RIVAROXABAN 20 MG PO TABS: 20.0000 mg | ORAL_TABLET | Freq: Every day | ORAL | 0 refills | Status: DC

## 2018-01-16 NOTE — Progress Notes (Signed)
   HPI  Patient presents today here for follow-up chronic medical conditions as well as Coumadin check.  Diabetes Good medication compliance Not really watching her diet, uses prednisone intermittently for rheumatoid arthritis.  Atrial fibrillation No palpitations, no bleeding, recently decreased her dose of Coumadin to take ciprofloxacin, her INR went as far down is 1.0.  Hypertension Good medication compliance No headache or chest pain. Nonfasting today.  Hyperlipidemia Watching her diet moderately, taking Lipitor and tolerating well  PMH: Smoking status noted ROS: Per HPI  Objective: BP 127/63   Pulse 74   Temp 97.9 F (36.6 C) (Oral)   Ht '5\' 8"'$  (1.727 m)   Wt 170 lb 9.6 oz (77.4 kg)   BMI 25.94 kg/m  Gen: NAD, alert, cooperative with exam HEENT: NCAT CV: RRR, good S1/S2, no murmur Resp: CTABL, no wheezes, non-labored Ext: No edema, warm Neuro: Alert and oriented, No gross deficits  Assessment and plan:  #Atrial flutter On Tikosyn doing well Offered changing to Xarelto today, we will check on the prices for her. INR is slightly subtherapeutic, however she has had good improvement over the last 2 weeks, recheck in 4 weeks. Can continue current dose  #Type 2 diabetes Suspect slip and control, has not had great follow-up. Continue metformin.  #Hypertension Well-controlled Continue amlodipine plus lisinopril Labs  #Hyperlipidemia Previous LDL was 96, repeat labs, nonfasting Continue statin    Orders Placed This Encounter  Procedures  . CoaguChek XS/INR Waived  . CMP14+EGFR  . CBC with Differential/Platelet  . Lipid panel  . TSH    No orders of the defined types were placed in this encounter.   Laroy Apple, MD [facility] 01/16/2018, 4:57 PM

## 2018-01-17 LAB — CMP14+EGFR
ALT: 15 IU/L (ref 0–32)
AST: 17 IU/L (ref 0–40)
Albumin/Globulin Ratio: 1 — ABNORMAL LOW (ref 1.2–2.2)
Albumin: 3.7 g/dL (ref 3.5–4.8)
Alkaline Phosphatase: 119 IU/L — ABNORMAL HIGH (ref 39–117)
BUN/Creatinine Ratio: 22 (ref 12–28)
BUN: 18 mg/dL (ref 8–27)
Bilirubin Total: 0.2 mg/dL (ref 0.0–1.2)
CALCIUM: 10.6 mg/dL — AB (ref 8.7–10.3)
CO2: 21 mmol/L (ref 20–29)
CREATININE: 0.81 mg/dL (ref 0.57–1.00)
Chloride: 108 mmol/L — ABNORMAL HIGH (ref 96–106)
GFR calc Af Amer: 83 mL/min/{1.73_m2} (ref >59)
GFR, EST NON AFRICAN AMERICAN: 72 mL/min/{1.73_m2} (ref >59)
Globulin, Total: 3.8 g/dL (ref 1.5–4.5)
Glucose: 147 mg/dL — ABNORMAL HIGH (ref 65–99)
Potassium: 4.3 mmol/L (ref 3.5–5.2)
Sodium: 144 mmol/L (ref 134–144)
Total Protein: 7.5 g/dL (ref 6.0–8.5)

## 2018-01-17 LAB — CBC WITH DIFFERENTIAL/PLATELET
BASOS: 0 %
Basophils Absolute: 0 10*3/uL (ref 0.0–0.2)
EOS (ABSOLUTE): 0.1 10*3/uL (ref 0.0–0.4)
EOS: 2 %
HEMATOCRIT: 34.6 % (ref 34.0–46.6)
HEMOGLOBIN: 11.7 g/dL (ref 11.1–15.9)
IMMATURE GRANS (ABS): 0 10*3/uL (ref 0.0–0.1)
IMMATURE GRANULOCYTES: 0 %
LYMPHS: 39 %
Lymphocytes Absolute: 1.6 10*3/uL (ref 0.7–3.1)
MCH: 27.8 pg (ref 26.6–33.0)
MCHC: 33.8 g/dL (ref 31.5–35.7)
MCV: 82 fL (ref 79–97)
MONOCYTES: 12 %
Monocytes Absolute: 0.5 10*3/uL (ref 0.1–0.9)
NEUTROS PCT: 47 %
Neutrophils Absolute: 1.9 10*3/uL (ref 1.4–7.0)
PLATELETS: 177 10*3/uL (ref 150–379)
RBC: 4.21 x10E6/uL (ref 3.77–5.28)
RDW: 16.4 % — ABNORMAL HIGH (ref 12.3–15.4)
WBC: 4 10*3/uL (ref 3.4–10.8)

## 2018-01-17 LAB — LIPID PANEL
Chol/HDL Ratio: 4.2 ratio (ref 0.0–4.4)
Cholesterol, Total: 151 mg/dL (ref 100–199)
HDL: 36 mg/dL — AB (ref >39)
LDL Calculated: 79 mg/dL (ref 0–99)
Triglycerides: 178 mg/dL — ABNORMAL HIGH (ref 0–149)
VLDL CHOLESTEROL CAL: 36 mg/dL (ref 5–40)

## 2018-01-17 LAB — TSH: TSH: 3.57 u[IU]/mL (ref 0.450–4.500)

## 2018-01-21 LAB — SPECIMEN STATUS REPORT

## 2018-01-21 LAB — HGB A1C W/O EAG: HEMOGLOBIN A1C: 6.6 % — AB (ref 4.8–5.6)

## 2018-01-26 ENCOUNTER — Other Ambulatory Visit: Payer: Self-pay | Admitting: Family Medicine

## 2018-01-28 DIAGNOSIS — I2510 Atherosclerotic heart disease of native coronary artery without angina pectoris: Secondary | ICD-10-CM | POA: Diagnosis not present

## 2018-01-28 DIAGNOSIS — C50911 Malignant neoplasm of unspecified site of right female breast: Secondary | ICD-10-CM | POA: Diagnosis not present

## 2018-01-28 DIAGNOSIS — E663 Overweight: Secondary | ICD-10-CM | POA: Diagnosis not present

## 2018-01-28 DIAGNOSIS — I4891 Unspecified atrial fibrillation: Secondary | ICD-10-CM | POA: Diagnosis not present

## 2018-01-28 DIAGNOSIS — Z23 Encounter for immunization: Secondary | ICD-10-CM | POA: Diagnosis not present

## 2018-01-28 DIAGNOSIS — M810 Age-related osteoporosis without current pathological fracture: Secondary | ICD-10-CM | POA: Diagnosis not present

## 2018-01-28 DIAGNOSIS — M0579 Rheumatoid arthritis with rheumatoid factor of multiple sites without organ or systems involvement: Secondary | ICD-10-CM | POA: Diagnosis not present

## 2018-01-28 DIAGNOSIS — R5383 Other fatigue: Secondary | ICD-10-CM | POA: Diagnosis not present

## 2018-01-28 DIAGNOSIS — Z6826 Body mass index (BMI) 26.0-26.9, adult: Secondary | ICD-10-CM | POA: Diagnosis not present

## 2018-01-31 ENCOUNTER — Other Ambulatory Visit: Payer: Self-pay | Admitting: Physician Assistant

## 2018-01-31 DIAGNOSIS — M810 Age-related osteoporosis without current pathological fracture: Secondary | ICD-10-CM

## 2018-02-03 ENCOUNTER — Telehealth: Payer: Self-pay | Admitting: Family Medicine

## 2018-02-03 NOTE — Telephone Encounter (Signed)
Pt aware - xorelto

## 2018-02-16 ENCOUNTER — Other Ambulatory Visit: Payer: Self-pay | Admitting: Family Medicine

## 2018-02-20 DIAGNOSIS — B351 Tinea unguium: Secondary | ICD-10-CM | POA: Diagnosis not present

## 2018-02-20 DIAGNOSIS — M79676 Pain in unspecified toe(s): Secondary | ICD-10-CM | POA: Diagnosis not present

## 2018-02-20 DIAGNOSIS — L84 Corns and callosities: Secondary | ICD-10-CM | POA: Diagnosis not present

## 2018-02-20 DIAGNOSIS — E1151 Type 2 diabetes mellitus with diabetic peripheral angiopathy without gangrene: Secondary | ICD-10-CM | POA: Diagnosis not present

## 2018-02-23 ENCOUNTER — Other Ambulatory Visit: Payer: Self-pay | Admitting: Cardiovascular Disease

## 2018-03-02 ENCOUNTER — Other Ambulatory Visit: Payer: Self-pay | Admitting: Cardiology

## 2018-03-13 DIAGNOSIS — M25552 Pain in left hip: Secondary | ICD-10-CM | POA: Diagnosis not present

## 2018-03-17 ENCOUNTER — Encounter: Payer: Self-pay | Admitting: Family Medicine

## 2018-03-17 ENCOUNTER — Ambulatory Visit (INDEPENDENT_AMBULATORY_CARE_PROVIDER_SITE_OTHER): Payer: Medicare Other | Admitting: Family Medicine

## 2018-03-17 ENCOUNTER — Telehealth: Payer: Self-pay | Admitting: Cardiology

## 2018-03-17 VITALS — BP 128/70 | HR 76 | Temp 98.9°F | Ht 68.0 in | Wt 174.0 lb

## 2018-03-17 DIAGNOSIS — Z7901 Long term (current) use of anticoagulants: Secondary | ICD-10-CM | POA: Diagnosis not present

## 2018-03-17 DIAGNOSIS — I4819 Other persistent atrial fibrillation: Secondary | ICD-10-CM

## 2018-03-17 DIAGNOSIS — I481 Persistent atrial fibrillation: Secondary | ICD-10-CM

## 2018-03-17 LAB — COAGUCHEK XS/INR WAIVED
INR: 1.3 — AB (ref 0.9–1.1)
PROTHROMBIN TIME: 16 s

## 2018-03-17 NOTE — Telephone Encounter (Signed)
Routed to primary [place] since dispensing amount requires approval

## 2018-03-17 NOTE — Progress Notes (Signed)
   HPI  Patient presents today for anticoagulation visit.  Patient is anticoagulated on Coumadin for chronic A. fib Patient denies any bleeding or change in diet. No recent courses of prednisone, she has been taking ibuprofen for which she feels is an RA flare, recommended avoiding NSAIDs with Coumadin.  Takes 5 mg daily, does not report any missed pills  PMH: Smoking status noted ROS: Per HPI  Objective: BP 128/70   Pulse 76   Temp 98.9 F (37.2 C) (Oral)   Ht 5\' 8"  (1.727 m)   Wt 174 lb (78.9 kg)   BMI 26.46 kg/m  Gen: NAD, alert, cooperative with exam HEENT: NCAT CV: RRR, good S1/S2, no murmur Resp: CTABL, no wheezes, non-labored Ext: No edema, warm Neuro: Alert and oriented, No gross deficits  Assessment and plan:  #Chronic anticoagulation, persistent atrial fibrillation Coumadin subtherapeutic today, INR 1.3 Current weekly dose is 35 mg, increased by 5 mg, take 1/2 tablet extra on Monday and Friday Follow-up in 2 weeks. Avoid NSAIDs with Coumadin use   Orders Placed This Encounter  Procedures  . CoaguChek XS/INR Carney Bern, MD [facility] 03/17/2018, 11:07 AM

## 2018-03-17 NOTE — Telephone Encounter (Signed)
New message    Pt c/o medication issue:  1. Name of Medication: atorvastatin (LIPITOR) 40 MG tablet  2. How are you currently taking this medication (dosage and times per day)? TAKE 1 & 1/2 (ONE & ONE-HALF) TABLETS BY MOUTH IN THE EVENING  3. Are you having a reaction (difficulty breathing--STAT)? no  4. What is your medication issue? Patient requesting call to [facility] [phone] to verify dosage and amount to dispense requires approval

## 2018-03-24 ENCOUNTER — Telehealth: Payer: Self-pay | Admitting: Cardiology

## 2018-03-24 MED ORDER — ATORVASTATIN CALCIUM 20 MG PO TABS: 60.0000 mg | ORAL_TABLET | Freq: Every day | ORAL | 11 refills | Status: DC

## 2018-03-24 NOTE — Telephone Encounter (Signed)
Returned call to patient who reports that lipitor 20mg  x3 tablets is NOT covered.  She reports her dose of lipitor was covered/approved last year  [employer] has acquired her [employer] Medicare Part D so this is her insurance provider now Med needs a quantity exception(?): please call [phone]

## 2018-03-24 NOTE — Telephone Encounter (Signed)
New message ° °Pt verbalized that she is returning call for RN °

## 2018-03-24 NOTE — Telephone Encounter (Signed)
RX dose was change to 20 mg daily, and rx sig is now to take 3 tablets daily, to see if insurance will cover sig that way.

## 2018-03-27 ENCOUNTER — Ambulatory Visit: Payer: Medicare Other | Admitting: *Deleted

## 2018-03-27 MED ORDER — ATORVASTATIN CALCIUM 20 MG PO TABS: ORAL_TABLET | ORAL | 3 refills | Status: DC

## 2018-03-27 NOTE — Telephone Encounter (Signed)
Call and spoke with Rep at [facility], pt was approved for Atorvastatin 20 mg 1 1/2 tablets daily

## 2018-03-27 NOTE — Telephone Encounter (Signed)
Pt made aware

## 2018-04-01 ENCOUNTER — Encounter: Payer: Self-pay | Admitting: Family Medicine

## 2018-04-01 ENCOUNTER — Ambulatory Visit (INDEPENDENT_AMBULATORY_CARE_PROVIDER_SITE_OTHER): Payer: Medicare Other | Admitting: Family Medicine

## 2018-04-01 VITALS — BP 121/66 | HR 72 | Temp 97.7°F | Ht 68.0 in | Wt 175.4 lb

## 2018-04-01 DIAGNOSIS — Z7901 Long term (current) use of anticoagulants: Secondary | ICD-10-CM

## 2018-04-01 LAB — COAGUCHEK XS/INR WAIVED
INR: 1.3 — ABNORMAL HIGH (ref 0.9–1.1)
Prothrombin Time: 15.1 s

## 2018-04-01 NOTE — Progress Notes (Signed)
   HPI  Patient presents today here for INR check.  Patient can easily describe her Coumadin regimen, 5 mg daily except for Monday and Friday when she takes 7.5 mg. No bleeding No diet changes.  PMH: Smoking status noted ROS: Per HPI  Objective: BP 121/66   Pulse 72   Temp 97.7 F (36.5 C) (Oral)   Ht 5\' 8"  (1.727 m)   Wt 175 lb 6.4 oz (79.6 kg)   BMI 26.67 kg/m  Gen: NAD, alert, cooperative with exam HEENT: NCAT CV: RRR, good S1/S2, no murmur Resp: CTABL, no wheezes, non-labored Ext: No edema, warm Neuro: Alert and oriented, No gross deficits  Assessment and plan:  #Chronic anticoagulation, persistent A. fib Coumadin is subtherapeutic - increase from 40 mg weekly to 42.5, recheck in 3-4 weeks.  Full visit for DM2 next visit   Orders Placed This Encounter  Procedures  . CoaguChek XS/INR Carney Bern, MD [facility] 04/01/2018, 3:37 PM

## 2018-04-13 ENCOUNTER — Other Ambulatory Visit: Payer: Self-pay | Admitting: Oncology

## 2018-04-24 ENCOUNTER — Encounter: Payer: Self-pay | Admitting: Family Medicine

## 2018-04-24 ENCOUNTER — Ambulatory Visit (INDEPENDENT_AMBULATORY_CARE_PROVIDER_SITE_OTHER): Payer: Medicare Other | Admitting: Family Medicine

## 2018-04-24 VITALS — BP 127/75 | HR 64 | Temp 98.0°F | Ht 68.0 in | Wt 174.2 lb

## 2018-04-24 DIAGNOSIS — R1032 Left lower quadrant pain: Secondary | ICD-10-CM

## 2018-04-24 DIAGNOSIS — E1151 Type 2 diabetes mellitus with diabetic peripheral angiopathy without gangrene: Secondary | ICD-10-CM | POA: Diagnosis not present

## 2018-04-24 DIAGNOSIS — Z7901 Long term (current) use of anticoagulants: Secondary | ICD-10-CM | POA: Diagnosis not present

## 2018-04-24 DIAGNOSIS — L84 Corns and callosities: Secondary | ICD-10-CM | POA: Diagnosis not present

## 2018-04-24 DIAGNOSIS — M79674 Pain in right toe(s): Secondary | ICD-10-CM | POA: Diagnosis not present

## 2018-04-24 DIAGNOSIS — B351 Tinea unguium: Secondary | ICD-10-CM | POA: Diagnosis not present

## 2018-04-24 LAB — COAGUCHEK XS/INR WAIVED
INR: 1.7 — ABNORMAL HIGH (ref 0.9–1.1)
Prothrombin Time: 20.5 s

## 2018-04-24 MED ORDER — AMOXICILLIN-POT CLAVULANATE 875-125 MG PO TABS: 1.0000 | ORAL_TABLET | Freq: Two times a day (BID) | ORAL | 0 refills | Status: DC

## 2018-04-24 NOTE — Patient Instructions (Signed)
Great to see you!  Come back in 2 weeks for INR check   Diverticulitis Diverticulitis is when small pockets in your large intestine (colon) get infected or swollen. This causes stomach pain and watery poop (diarrhea). These pouches are called diverticula. They form in people who have a condition called diverticulosis. Follow these instructions at home: Medicines  Take over-the-counter and prescription medicines only as told by your doctor. These include: ? Antibiotics. ? Pain medicines. ? Fiber pills. ? Probiotics. ? Stool softeners.  Do not drive or use heavy machinery while taking prescription pain medicine.  If you were prescribed an antibiotic, take it as told. Do not stop taking it even if you feel better. General instructions  Follow a diet as told by your doctor.  When you feel better, your doctor may tell you to change your diet. You may need to eat a lot of fiber. Fiber makes it easier to poop (have bowel movements). Healthy foods with fiber include: ? Berries. ? Beans. ? Lentils. ? Green vegetables.  Exercise 3 or more times a week. Aim for 30 minutes each time. Exercise enough to sweat and make your heart beat faster.  Keep all follow-up visits as told. This is important. You may need to have an exam of the large intestine. This is called a colonoscopy. Contact a doctor if:  Your pain does not get better.  You have a hard time eating or drinking.  You are not pooping like normal. Get help right away if:  Your pain gets worse.  Your problems do not get better.  Your problems get worse very fast.  You have a fever.  You throw up (vomit) more than one time.  You have poop that is: ? Bloody. ? Black. ? Tarry. Summary  Diverticulitis is when small pockets in your large intestine (colon) get infected or swollen.  Take medicines only as told by your doctor.  Follow a diet as told by your doctor. This information is not intended to replace advice  given to you by your health care provider. Make sure you discuss any questions you have with your health care provider. Document Released: 05/14/2008 Document Revised: 12/13/2016 Document Reviewed: 12/13/2016 Elsevier Interactive Patient Education  2017 [employer].

## 2018-04-24 NOTE — Progress Notes (Signed)
   HPI  Patient presents today left lower quadrant pain.  Patient states her pain is similar to previous episodes of diverticulitis.  Is been going on for 1 day.  She denies fever or sweats.  She is tolerating food and fluids like usual.  She has some stool urgency but no diarrhea.  PMH: Smoking status noted ROS: Per HPI  Objective: BP 127/75   Pulse 64   Temp 98 F (36.7 C) (Oral)   Ht 5\' 8"  (1.727 m)   Wt 174 lb 3.2 oz (79 kg)   BMI 26.49 kg/m  Gen: NAD, alert, cooperative with exam HEENT: NCAT CV: RRR, good S1/S2, no murmur Resp: CTABL, no wheezes, non-labored Abd: Soft, no guarding, positive bowel sounds, tenderness in the left lower quadrant Ext: No edema, warm Neuro: Alert and oriented, No gross deficits  Assessment and plan:  #Left lower quadrant pain Likely developing diverticulitis Augmentin prescribed, she has a documented allergy in EMR this is described as penicillin 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  However it is been 50 years and she tried it I think it will be quite effective at this point.  She denies any history of rash or allergic reaction.   #Chronic anticoagulation INR slightly subtherapeutic today at 1.7, however antibiotics could have small effect ( cipro flagyl definitely would, will need to escalate if worsening or augmentin not effective) No changes Recheck in 2 weeks     Orders Placed This Encounter  Procedures  . CoaguChek XS/INR Waived    Meds ordered this encounter  Medications  . amoxicillin-clavulanate (AUGMENTIN) 875-125 MG tablet    Sig: Take 1 tablet by mouth 2 (two) times daily.    Dispense:  14 tablet    Refill:  0    Laroy Apple, MD [facility] 04/24/2018, 10:35 AM

## 2018-04-28 DIAGNOSIS — M810 Age-related osteoporosis without current pathological fracture: Secondary | ICD-10-CM | POA: Diagnosis not present

## 2018-04-28 DIAGNOSIS — R5383 Other fatigue: Secondary | ICD-10-CM | POA: Diagnosis not present

## 2018-04-28 DIAGNOSIS — I2510 Atherosclerotic heart disease of native coronary artery without angina pectoris: Secondary | ICD-10-CM | POA: Diagnosis not present

## 2018-04-28 DIAGNOSIS — C50911 Malignant neoplasm of unspecified site of right female breast: Secondary | ICD-10-CM | POA: Diagnosis not present

## 2018-04-28 DIAGNOSIS — M0579 Rheumatoid arthritis with rheumatoid factor of multiple sites without organ or systems involvement: Secondary | ICD-10-CM | POA: Diagnosis not present

## 2018-04-28 DIAGNOSIS — I4891 Unspecified atrial fibrillation: Secondary | ICD-10-CM | POA: Diagnosis not present

## 2018-04-28 DIAGNOSIS — E663 Overweight: Secondary | ICD-10-CM | POA: Diagnosis not present

## 2018-04-28 DIAGNOSIS — Z6826 Body mass index (BMI) 26.0-26.9, adult: Secondary | ICD-10-CM | POA: Diagnosis not present

## 2018-05-01 ENCOUNTER — Ambulatory Visit: Payer: Medicare Other | Admitting: Family Medicine

## 2018-05-05 ENCOUNTER — Other Ambulatory Visit: Payer: Self-pay | Admitting: Family Medicine

## 2018-05-05 ENCOUNTER — Other Ambulatory Visit: Payer: Self-pay | Admitting: Cardiology

## 2018-05-06 NOTE — Telephone Encounter (Signed)
Rx sent to pharmacy   

## 2018-05-08 ENCOUNTER — Ambulatory Visit (INDEPENDENT_AMBULATORY_CARE_PROVIDER_SITE_OTHER): Payer: Medicare Other | Admitting: Family Medicine

## 2018-05-08 ENCOUNTER — Encounter: Payer: Self-pay | Admitting: Family Medicine

## 2018-05-08 VITALS — BP 109/69 | HR 70 | Temp 97.0°F | Ht 68.0 in | Wt 171.6 lb

## 2018-05-08 DIAGNOSIS — Z7901 Long term (current) use of anticoagulants: Secondary | ICD-10-CM

## 2018-05-08 DIAGNOSIS — I4819 Other persistent atrial fibrillation: Secondary | ICD-10-CM

## 2018-05-08 DIAGNOSIS — I481 Persistent atrial fibrillation: Secondary | ICD-10-CM

## 2018-05-08 DIAGNOSIS — Z79899 Other long term (current) drug therapy: Secondary | ICD-10-CM | POA: Diagnosis not present

## 2018-05-08 LAB — COAGUCHEK XS/INR WAIVED
INR: 1.5 — ABNORMAL HIGH (ref 0.9–1.1)
PROTHROMBIN TIME: 18.3 s

## 2018-05-08 MED ORDER — RIVAROXABAN 20 MG PO TABS: 20.0000 mg | ORAL_TABLET | Freq: Every day | ORAL | 5 refills | Status: DC

## 2018-05-08 NOTE — Progress Notes (Signed)
   HPI  Patient presents today for follow-up anticoagulation.  Patient denies any recent bleeding, she denies any missed pills of Coumadin. She is taking 1-1/2 tablets of Coumadin on Monday Wednesday and Friday and 1 tablet every other day. She would like to change to another new novel agent  PMH: Smoking status noted ROS: Per HPI  Objective: BP 109/69   Pulse 70   Temp (!) 97 F (36.1 C) (Oral)   Ht 5\' 8"  (1.727 m)   Wt 171 lb 9.6 oz (77.8 kg)   BMI 26.09 kg/m  Gen: NAD, alert, cooperative with exam HEENT: NCAT CV: RRR, good S1/S2, Resp: CTABL, no wheezes, non-labored Ext: No edema, warm Neuro: Alert and oriented, No gross deficits  Assessment and plan:  #She will fibrillation, chronic anticoagulation INR subtherapeutic at 1.5.  We have struggled to keep a therapeutic INR lately for her. She is currently on 42.5 mg weekly, increase by 5 mg/week, will now take 1.5 tablets on Monday, Wednesday, Friday, Saturday, Sunday Follow-up 3 to 4 weeks.  Sent in [place] for her to check pricing, she will call for instructions to switch if it is affordable.    Orders Placed This Encounter  Procedures  . CoaguChek XS/INR Waived    Meds ordered this encounter  Medications  . rivaroxaban (XARELTO) 20 MG TABS tablet    Sig: Take 1 tablet (20 mg total) by mouth daily with supper.    Dispense:  30 tablet    Refill:  [place], MD [place] 05/08/2018, 10:32 AM

## 2018-05-08 NOTE — Patient Instructions (Signed)
Great to see you!   

## 2018-05-11 ENCOUNTER — Other Ambulatory Visit: Payer: Self-pay | Admitting: Family Medicine

## 2018-05-12 ENCOUNTER — Ambulatory Visit (INDEPENDENT_AMBULATORY_CARE_PROVIDER_SITE_OTHER): Payer: Medicare Other | Admitting: *Deleted

## 2018-05-12 ENCOUNTER — Encounter: Payer: Self-pay | Admitting: *Deleted

## 2018-05-12 VITALS — BP 148/82 | HR 61 | Ht 68.0 in | Wt 172.0 lb

## 2018-05-12 DIAGNOSIS — Z0000 Encounter for general adult medical examination without abnormal findings: Secondary | ICD-10-CM

## 2018-05-12 NOTE — Patient Instructions (Signed)
Please consider getting your pneumonia and shingles vaccines at your upcoming office visit with Dr. Wendi Snipes.   At your convenience, please bring our office a copy of your Advanced Directives to be filed in your chart.  Please work on your goal of increasing your exercise to 3 days per week for 1 hour each session.   Please remember to schedule your eye exam.   Thank you for coming in for your Annual Wellness Visit today!    Preventive Care 60 Years and Older, Female Preventive care refers to lifestyle choices and visits with your health care provider that can promote health and wellness. What does preventive care include?  A yearly physical exam. This is also called an annual well check.  Dental exams once or twice a year.  Routine eye exams. Ask your health care provider how often you should have your eyes checked.  Personal lifestyle choices, including: ? Daily care of your teeth and gums. ? Regular physical activity. ? Eating a healthy diet. ? Avoiding tobacco and drug use. ? Limiting alcohol use. ? Practicing safe sex. ? Taking low-dose aspirin every day. ? Taking vitamin and mineral supplements as recommended by your health care provider. What happens during an annual well check? The services and screenings done by your health care provider during your annual well check will depend on your age, overall health, lifestyle risk factors, and family history of disease. Counseling Your health care provider may ask you questions about your:  Alcohol use.  Tobacco use.  Drug use.  Emotional well-being.  Home and relationship well-being.  Sexual activity.  Eating habits.  History of falls.  Memory and ability to understand (cognition).  Work and work Statistician.  Reproductive health.  Screening You may have the following tests or measurements:  Height, weight, and BMI.  Blood pressure.  Lipid and cholesterol levels. These may be checked every 5 years, or  more frequently if you are over 7 years old.  Skin check.  Lung cancer screening. You may have this screening every year starting at age 41 if you have a 30-pack-year history of smoking and currently smoke or have quit within the past 15 years.  Fecal occult blood test (FOBT) of the stool. You may have this test every year starting at age 42.  Flexible sigmoidoscopy or colonoscopy. You may have a sigmoidoscopy every 5 years or a colonoscopy every 10 years starting at age 42.  Hepatitis C blood test.  Hepatitis B blood test.  Sexually transmitted disease (STD) testing.  Diabetes screening. This is done by checking your blood sugar (glucose) after you have not eaten for a while (fasting). You may have this done every 1-3 years.  Bone density scan. This is done to screen for osteoporosis. You may have this done starting at age 70.  Mammogram. This may be done every 1-2 years. Talk to your health care provider about how often you should have regular mammograms.  Talk with your health care provider about your test results, treatment options, and if necessary, the need for more tests. Vaccines Your health care provider may recommend certain vaccines, such as:  Influenza vaccine. This is recommended every year.  Tetanus, diphtheria, and acellular pertussis (Tdap, Td) vaccine. You may need a Td booster every 10 years.  Varicella vaccine. You may need this if you have not been vaccinated.  Zoster vaccine. You may need this after age 42.  Measles, mumps, and rubella (MMR) vaccine. You may need at least one dose  of MMR if you were born in 1957 or later. You may also need a second dose.  Pneumococcal 13-valent conjugate (PCV13) vaccine. One dose is recommended after age 28.  Pneumococcal polysaccharide (PPSV23) vaccine. One dose is recommended after age 25.  Meningococcal vaccine. You may need this if you have certain conditions.  Hepatitis A vaccine. You may need this if you have  certain conditions or if you travel or work in places where you may be exposed to hepatitis A.  Hepatitis B vaccine. You may need this if you have certain conditions or if you travel or work in places where you may be exposed to hepatitis B.  Haemophilus influenzae type b (Hib) vaccine. You may need this if you have certain conditions.  Talk to your health care provider about which screenings and vaccines you need and how often you need them. This information is not intended to replace advice given to you by your health care provider. Make sure you discuss any questions you have with your health care provider. Document Released: 12/23/2015 Document Revised: 08/15/2016 Document Reviewed: 09/27/2015  Elsevier Interactive Patient Education  2018 [place] in the Home Falls can cause injuries. They can happen to people of all ages. There are many things you can do to make your home safe and to help prevent falls. What can I do on the outside of my home?  Regularly fix the edges of walkways and driveways and fix any cracks.  Remove anything that might make you trip as you walk through a door, such as a raised step or threshold.  Trim any bushes or trees on the path to your home.  Use bright outdoor lighting.  Clear any walking paths of anything that might make someone trip, such as rocks or tools.  Regularly check to see if handrails are loose or broken. Make sure that both sides of any steps have handrails.  Any raised decks and porches should have guardrails on the edges.  Have any leaves, snow, or ice cleared regularly.  Use sand or salt on walking paths during winter.  Clean up any spills in your garage right away. This includes oil or grease spills. What can I do in the bathroom?  Use night lights.  Install grab bars by the toilet and in the tub and shower. Do not use towel bars as grab bars.  Use non-skid mats or decals in the tub or shower.  If you  need to sit down in the shower, use a plastic, non-slip stool.  Keep the floor dry. Clean up any water that spills on the floor as soon as it happens.  Remove soap buildup in the tub or shower regularly.  Attach bath mats securely with double-sided non-slip rug tape.  Do not have throw rugs and other things on the floor that can make you trip. What can I do in the bedroom?  Use night lights.  Make sure that you have a light by your bed that is easy to reach.  Do not use any sheets or blankets that are too big for your bed. They should not hang down onto the floor.  Have a firm chair that has side arms. You can use this for support while you get dressed.  Do not have throw rugs and other things on the floor that can make you trip. What can I do in the kitchen?  Clean up any spills right away.  Avoid walking on wet floors.  Keep items that you use a lot in easy-to-reach places.  If you need to reach something above you, use a strong step stool that has a grab bar.  Keep electrical cords out of the way.  Do not use floor polish or wax that makes floors slippery. If you must use wax, use non-skid floor wax.  Do not have throw rugs and other things on the floor that can make you trip. What can I do with my stairs?  Do not leave any items on the stairs.  Make sure that there are handrails on both sides of the stairs and use them. Fix handrails that are broken or loose. Make sure that handrails are as long as the stairways.  Check any carpeting to make sure that it is firmly attached to the stairs. Fix any carpet that is loose or worn.  Avoid having throw rugs at the top or bottom of the stairs. If you do have throw rugs, attach them to the floor with carpet tape.  Make sure that you have a light switch at the top of the stairs and the bottom of the stairs. If you do not have them, ask someone to add them for you. What else can I do to help prevent falls?  Wear shoes  that: ? Do not have high heels. ? Have rubber bottoms. ? Are comfortable and fit you well. ? Are closed at the toe. Do not wear sandals.  If you use a stepladder: ? Make sure that it is fully opened. Do not climb a closed stepladder. ? Make sure that both sides of the stepladder are locked into place. ? Ask someone to hold it for you, if possible.  Clearly mark and make sure that you can see: ? Any grab bars or handrails. ? First and last steps. ? Where the edge of each step is.  Use tools that help you move around (mobility aids) if they are needed. These include: ? Canes. ? Walkers. ? Scooters. ? Crutches.  Turn on the lights when you go into a dark area. Replace any light bulbs as soon as they burn out.  Set up your furniture so you have a clear path. Avoid moving your furniture around.  If any of your floors are uneven, fix them.  If there are any pets around you, be aware of where they are.  Review your medicines with your doctor. Some medicines can make you feel dizzy. This can increase your chance of falling. Ask your doctor what other things that you can do to help prevent falls. This information is not intended to replace advice given to you by your health care provider. Make sure you discuss any questions you have with your health care provider. Document Released: 09/22/2009 Document Revised: 05/03/2016 Document Reviewed: 12/31/2014 Elsevier Interactive Patient Education  [employer].

## 2018-05-13 NOTE — Progress Notes (Signed)
Subjective:   Melody Casey is a 72 y.o. female who presents for Medicare Annual (Subsequent) preventive examination.  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  She enjoys reading.  She lives with her husband who was very ill last year, but his health has improved and she feels her stress level is decreased.  She has 2 children, 7 grandchildren, and 3 great grandchildren who do not live locally.  Melody Casey and her husband have 2 dogs.  She feels her health is worse this year than last year due to her problems with rheumatoid arthritis.  She reports no hospitalizations, ER visits, or surgeries in the past year.   Review of Systems:  All negative today       Objective:     Vitals: BP (!) 148/82   Pulse 61   Ht 5\' 8"  (1.727 m)   Wt 172 lb (78 kg)   BMI 26.15 kg/m   Body mass index is 26.15 kg/m.  Advanced Directives 05/12/2018 03/26/2017 08/07/2016 04/09/2016 01/04/2016 11/16/2015 08/30/2015  Does Patient Have a Medical Advance Directive? Yes Yes Yes Yes Yes Yes No  Type of Paramedic of [place];Living will [place];Living will - Living will - [place];Living will -  Does patient want to make changes to medical advance directive? - No - Patient declined - - - No - Patient declined -  Copy of [place] in Chart? No - copy requested No - copy requested - No - copy requested - No - copy requested -  Would patient like information on creating a medical advance directive? - - Yes - Educational materials given - - - -    Tobacco Social History   Tobacco Use  Smoking Status Former Smoker  . Packs/day: 2.00  . Years: 50.00  . Pack years: 100.00  . Types: Cigarettes  . Last attempt to quit: 02/08/2011  . Years since quitting: 7.2  Smokeless Tobacco Never Used     Counseling given: No   Clinical Intake:     Pain Score: 0-No  pain                 Past Medical History:  Diagnosis Date  . AAA (abdominal aortic aneurysm) ([place])   . Acute diverticulitis   . Anxiety   . Arthritis   . Atrial fibrillation ([place])    a. Dx 03/2011 - on tikosyn/coumadin.  Marland Kitchen CAD (coronary artery disease)    a. NSTEMI 02/2011: occ mid Cx, DES to OM2, residual nonobst LAD dz.  . Cancer ([place])    breast  . Diabetes mellitus   . Diverticulitis   . Diverticulitis of colon     2008, 04/2011, 12/2014, 08/2015  . 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 Hemangioma    liver  . HLD (hyperlipidemia)   . HTN (hypertension)   . Osteoporosis   . Tobacco abuse    stopped smoking 2012  . Ureteral obstruction    History of gross hematuria/right hydronephrosis 2/2 to uteropelvic junction obstruction, s/p cystoscopy in January 2007 with bilateral retrograde pyelography, right ureter arthroscopy, right ureteral stent placement, bladder biopsies, stent removal since then.  . Wears dentures    top   Past Surgical History:  Procedure Laterality Date  . BIOPSY N/A 11/01/2015   Procedure: BIOPSY;  Surgeon: Danie Binder, MD;  Location: [facility];  Service: Endoscopy;  Laterality: N/A;  . BREAST LUMPECTOMY WITH NEEDLE LOCALIZATION AND AXILLARY SENTINEL LYMPH NODE BX Right 03/01/2014   Procedure: BREAST LUMPECTOMY WITH NEEDLE LOCALIZATION AND AXILLARY SENTINEL LYMPH NODE BX;  Surgeon: Edward Jolly, MD;  Location: [facility];  Service: General;  Laterality: Right;  . BREAST SURGERY    . CARDIAC CATHETERIZATION  2012   stent  . COLONOSCOPY WITH PROPOFOL N/A 06/29/2014   Dr. Wynetta Emery: universal diverticulosis  . CORONARY STENT PLACEMENT  2012  . ESOPHAGOGASTRODUODENOSCOPY (EGD) WITH PROPOFOL N/A 11/01/2015   Procedure: ESOPHAGOGASTRODUODENOSCOPY (EGD) WITH PROPOFOL;  Surgeon: Danie Binder, MD;  Location: [facility];  Service: Endoscopy;  Laterality: N/A;  . KNEE ARTHROSCOPY     left  . Right leg surgery  age 56   As a child  for  ?scleroderma per patient  . TONSILLECTOMY AND ADENOIDECTOMY    . TUBAL LIGATION    . Ureteral surgery  2011   rt ureterostomy-stent   Family History  Problem Relation Age of Onset  . Lung cancer Father 5       deceased  . Cancer Father   . Heart failure Mother 29       deceased  . Heart disease Mother   . Diabetes Brother   . Hypertension Brother   . Cancer Brother        prostate  . Colon cancer Neg Hx   . Liver disease Neg Hx    Social History   Socioeconomic History  . Marital status: Married    Spouse name: Not on file  . Number of children: 2  . Years of education: Not on file  . Highest education level: Not on file  Occupational History  . Occupation: Research scientist (life sciences)  Social Needs  . Financial resource strain: Not on file  . Food insecurity:    Worry: Not on file    Inability: Not on file  . Transportation needs:    Medical: Not on file    Non-medical: Not on file  Tobacco Use  . Smoking status: Former Smoker    Packs/day: 2.00    Years: 50.00    Pack years: 100.00    Types: Cigarettes    Last attempt to quit: 02/08/2011    Years since quitting: 7.2  . Smokeless tobacco: Never Used  Substance and Sexual Activity  . Alcohol use: No    Alcohol/week: 0.0 oz  . Drug use: No  . Sexual activity: Never  Lifestyle  . Physical activity:    Days per week: Not on file    Minutes per session: Not on file  . Stress: Not on file  Relationships  . Social connections:    Talks on phone: Not on file    Gets together: Not on file    Attends religious service: Not on file    Active member of club or organization: Not on file    Attends meetings of clubs or organizations: Not on file    Relationship status: Not on file  Other Topics Concern  . Not on file  Social History Narrative   Takes care 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     Outpatient Encounter Medications as of 05/12/2018  Medication Sig  . ALPRAZolam (XANAX) 0.25 MG tablet Take 1  tablet (0.25 mg total) by mouth daily as needed for anxiety.  Marland Kitchen amLODipine (NORVASC) 2.5 MG tablet Take 1 tablet (2.5 mg total) by mouth daily. Please  call to make appointment for further refills.  Marland Kitchen anastrozole (ARIMIDEX) 1 MG tablet TAKE 1 TABLET BY MOUTH ONCE DAILY  . atorvastatin (LIPITOR) 20 MG tablet Take 1 1/2 tablets daily  . calcium-vitamin D (OSCAL WITH D) 500-200 MG-UNIT per tablet Take 1 tablet by mouth daily with breakfast.   . dofetilide (TIKOSYN) 250 MCG capsule Take 1 capsule (250 mcg total) by mouth 2 (two) times daily.  . hydroxychloroquine (PLAQUENIL) 200 MG tablet Take by mouth 2 (two) times daily.  Marland Kitchen KLOR-CON M20 20 MEQ tablet TAKE 1 TABLET BY MOUTH TWICE DAILY  . lisinopril (PRINIVIL,ZESTRIL) 40 MG tablet TAKE 1 TABLET BY MOUTH IN THE MORNING  . magnesium oxide (MAG-OX) 400 MG tablet Take 1 tablet (400 mg total) by mouth daily. Take 400 mg twice a day with food (Patient taking differently: Take 400 mg by mouth daily. )  . metFORMIN (GLUCOPHAGE) 500 MG tablet TAKE 1 TABLET BY MOUTH ONCE DAILY  . nitroGLYCERIN (NITROSTAT) 0.4 MG SL tablet Place 1 tablet (0.4 mg total) under the tongue every 5 (five) minutes as needed for chest pain.  Marland Kitchen warfarin (COUMADIN) 5 MG tablet Take 1 tablet daily except on Thursdays take 1 and 1/2 tablets. (Patient taking differently: Take 5 mg by mouth daily. Take 7.5 Monday, wed and Friday)  . warfarin (COUMADIN) 5 MG tablet TAKE 1 TO 1 & 1/2 (ONE TO ONE & ONE-HALF) TABLETS BY MOUTH ONCE DAILY  . rivaroxaban (XARELTO) 20 MG TABS tablet Take 1 tablet (20 mg total) by mouth daily with supper. (Patient not taking: Reported on 05/12/2018)   No facility-administered encounter medications on file as of 05/12/2018.     Activities of Daily Living In your present state of health, do you have any difficulty performing the following activities: 05/12/2018  Hearing? Y  Comment Significantly decrased hearing in right ear, seen by ENT.  They recommended patient see  audiolgoy for hearing aid evaluation, patient plans to set this up.  Vision? N  Difficulty concentrating or making decisions? N  Walking or climbing stairs? Y  Comment due to left knee pain  Dressing or bathing? N  Doing errands, shopping? N  Preparing Food and eating ? N  Using the Toilet? N  In the past six months, have you accidently leaked urine? N  Do you have problems with loss of bowel control? N  Managing your Medications? N  Managing your Finances? N  Housekeeping or managing your Housekeeping? N  Some recent data might be hidden    Patient Care Team: Timmothy Euler, MD as PCP - General (Family Medicine) Danie Binder, MD (Gastroenterology) Minus Breeding, MD as Consulting Physician (Cardiology) Magrinat, Virgie Dad, MD as Consulting Physician (Oncology)    Assessment:   This is a routine wellness examination for Neaveh.  Exercise Activities and Dietary recommendations Current Exercise Habits: The patient does not participate in regular exercise at present, Exercise limited by: orthopedic condition(s)  Patient states she usually eats 2-3 meals per day.  Tries to avoid fried foods, and eat significant amount of fruits and vegetables daily.    Goals    . Exercise 3x per week (60 min per time)     Patient plans to join [employer] and exercise there 3 times per week for 60 minutes doing a combination of cardiovascular and strength training.       Fall Risk Fall Risk  05/13/2018 05/12/2018 05/12/2018 05/08/2018 04/24/2018  Falls in the past year? (No Data) Yes No  No No  Comment tripped over her dog - - - -  Number falls in past yr: - 1 - - -  Injury with Fall? - No - - -  Follow up - Education provided;Falls prevention discussed - - -   Is the patient's home free of loose throw rugs in walkways, pet beds, electrical cords, etc?   yes      Grab bars in the bathroom? no      Handrails on the stairs?   yes      Adequate lighting?   yes    Depression  Screen PHQ 2/9 Scores 05/12/2018 05/08/2018 04/24/2018 04/01/2018  PHQ - 2 Score 0 0 0 0  PHQ- 9 Score - - - -     Cognitive Function MMSE - Mini Mental State Exam 05/13/2018 11/16/2015  Orientation to time 5 5  Orientation to Place 5 5  Registration 3 3  Attention/ Calculation 5 5  Recall 3 3  Language- name 2 objects 2 2  Language- repeat 1 1  Language- follow 3 step command 3 3  Language- read & follow direction 1 1  Write a sentence 1 1  Copy design 1 1  Total score 30 30        Immunization History  Administered Date(s) Administered  . Tdap 05/02/2012    Qualifies for Shingles Vaccine? Declined today  Screening Tests Health Maintenance  Topic Date Due  . PNA vac Low Risk Adult (1 of 2 - PCV13) 01/16/2009  . OPHTHALMOLOGY EXAM  01/11/2017  . FOOT EXAM  03/26/2018  . MAMMOGRAM  05/10/2018  . INFLUENZA VACCINE  07/10/2018  . HEMOGLOBIN A1C  07/16/2018  . TETANUS/TDAP  05/02/2022  . COLONOSCOPY  06/29/2024  . DEXA SCAN  Completed  . Hepatitis C Screening  Completed   Patient plans to schedule her eye exam with [facility] in the next month.   Foot exam and Prevnar recommended at next visit with Dr. Wendi Snipes. Mammogram scheduled for June 2019 at [facility].    Cancer Screenings: Lung: Low Dose CT Chest recommended if Age 92-80 years, 30 pack-year currently smoking OR have quit w/in 15years. Patient does qualify. Breast:  Up to date on Mammogram? No  - scheduled for June at [facility] to date of Bone Density/Dexa? Yes Colorectal: up to date  Additional Screenings: Hepatitis C Screening:  Completed      Plan:     Consider getting your pneumonia and shingles vaccines at your upcoming office visit with Dr. Wendi Snipes.  Please bring our office a copy of your Advanced Directives to be filed in your chart. Work on your goal of increasing your exercise to 3 days per week for 1 hour each session.   Schedule your eye exam.      I have personally reviewed and noted  the following in the patient's chart:   . Medical and social history . Use of alcohol, tobacco or illicit drugs  . Current medications and supplements . Functional ability and status . Nutritional status . Physical activity . Advanced directives . List of other physicians . Hospitalizations, surgeries, and ER visits in previous 12 months . Vitals . Screenings to include cognitive, depression, and falls . Referrals and appointments  In addition, I have reviewed and discussed with patient certain preventive protocols, quality metrics, and best practice recommendations. A written personalized care plan for preventive services as well as general preventive health recommendations were provided to patient.     WYATT,  AMY M, RN  05/13/2018   I have reviewed and agree with the above AWV documentation.   Laroy Apple, MD [facility] 05/15/2018, 12:47 PM

## 2018-05-16 ENCOUNTER — Telehealth: Payer: Self-pay | Admitting: Pharmacist

## 2018-05-16 NOTE — Telephone Encounter (Signed)
Tikosyn samples received today 05/16/18.  Will leave @ [facility] for pick up

## 2018-05-22 ENCOUNTER — Encounter: Payer: Self-pay | Admitting: Oncology

## 2018-05-22 DIAGNOSIS — R928 Other abnormal and inconclusive findings on diagnostic imaging of breast: Secondary | ICD-10-CM | POA: Diagnosis not present

## 2018-05-22 DIAGNOSIS — Z853 Personal history of malignant neoplasm of breast: Secondary | ICD-10-CM | POA: Diagnosis not present

## 2018-06-03 ENCOUNTER — Encounter: Payer: Self-pay | Admitting: Family Medicine

## 2018-06-03 ENCOUNTER — Ambulatory Visit (INDEPENDENT_AMBULATORY_CARE_PROVIDER_SITE_OTHER): Payer: Medicare Other | Admitting: Family Medicine

## 2018-06-03 VITALS — BP 137/77 | HR 76 | Temp 97.1°F | Ht 68.0 in | Wt 172.4 lb

## 2018-06-03 DIAGNOSIS — K5792 Diverticulitis of intestine, part unspecified, without perforation or abscess without bleeding: Secondary | ICD-10-CM

## 2018-06-03 DIAGNOSIS — Z7901 Long term (current) use of anticoagulants: Secondary | ICD-10-CM

## 2018-06-03 DIAGNOSIS — I714 Abdominal aortic aneurysm, without rupture, unspecified: Secondary | ICD-10-CM

## 2018-06-03 LAB — COAGUCHEK XS/INR WAIVED
INR: 2.8 — AB (ref 0.9–1.1)
PROTHROMBIN TIME: 34.1 s

## 2018-06-03 MED ORDER — AMOXICILLIN-POT CLAVULANATE 875-125 MG PO TABS: 1.0000 | ORAL_TABLET | Freq: Two times a day (BID) | ORAL | 0 refills | Status: DC

## 2018-06-03 NOTE — Patient Instructions (Signed)
Great to see you!  Start augmentin for your abdominal pain  Come back for INR in 3-4 weeks

## 2018-06-03 NOTE — Progress Notes (Signed)
   HPI  Patient presents today with abdominal pain and for INR check.  Patient had an adjustment in her Coumadin checked 2 weeks ago, she denies any bleeding and has not had any diet changes. No missed pills. She has also started prednisone recently.  Abdominal pain Patient states that 2 days ago she began to have right upper quadrant abdominal pain which is on usual for diverticulitis for her, however and then set into atypical left lower quadrant abdominal pain that is characteristic of her diverticulitis. She states that she has not had good bowel movements for 2 days, she has had some mucus passed. She has had pink-tinged mucus on one occasion but no rectal bleeding.  PMH: Smoking status noted ROS: Per HPI  Objective: BP 137/77   Pulse 76   Temp (!) 97.1 F (36.2 C) (Oral)   Ht 5\' 8"  (1.727 m)   Wt 172 lb 6.4 oz (78.2 kg)   BMI 26.21 kg/m  Gen: NAD, alert, cooperative with exam HEENT: NCAT CV: RRR, good S1/S2, no murmur Resp: CTABL, no wheezes, non-labored Abd: Soft, no guarding, tenderness to palpation of left lower quadrant, positive bowel sounds Ext: No edema, warm Neuro: Alert and oriented, No gross deficits  Assessment and plan:  #Chronic anticoagulation-due to atrial fibrillation Therapeutic today Repeat in 3 to 4 weeks considering high risk medication prednisone  #Diverticulitis Patient with history of recurrent diverticulitis Recommend establishing care with a GI doctor Treating with Augmentin, she is tolerating this well recently  #Abdominal aortic aneurysm Ultrasound follow-up for aneurysm seen that was 3.3 cm 3 years ago   Orders Placed This Encounter  Procedures  . US Aorta    Standing Status:   Future    Standing Expiration Date:   08/04/2019    Order Specific Question:   Reason for Exam (SYMPTOM  OR DIAGNOSIS REQUIRED)    Answer:   Aortic aneurysm    Order Specific Question:   Preferred imaging location?    Answer:   [facility]  . Ambulatory referral to Gastroenterology    Referral Priority:   Routine    Referral Type:   Consultation    Referral Reason:   Specialty Services Required    Number of Visits Requested:   1    Meds ordered this encounter  Medications  . amoxicillin-clavulanate (AUGMENTIN) 875-125 MG tablet    Sig: Take 1 tablet by mouth 2 (two) times daily.    Dispense:  20 tablet    Refill:  0    Laroy Apple, MD [facility] 06/03/2018, 3:09 PM

## 2018-06-03 NOTE — Progress Notes (Deleted)
HPI The patient presents for followup of atrial fib.  Since I last saw her ***   She has done well. Her husband has been ill and she's had to do a lot of caregiving.  Marland Kitchen However, despite that she's done well. The patient denies any new symptoms such as chest discomfort, neck or arm discomfort. There has been no new shortness of breath, PND or orthopnea. There have been no reported palpitations, presyncope or syncope.   Allergies  Allergen Reactions  . Miralax [Polyethylene Glycol] Swelling and Rash    Took [facility] brand developed rash. Patient states she tolerated name brand MiraLax in the past.  09/01/15. Patient states she had diffuse swelling including swelling of her lips.   . Vancomycin Rash and Shortness Of Breath  . Acetaminophen Hives  . Oxycodone-Acetaminophen Itching  . Sulfa Antibiotics Rash    All over rash  . Sulfacetamide Sodium Rash    All over rash  . Banana Other (See Comments)    Unknown  . Latex Rash  . Tape Rash    Current Outpatient Medications  Medication Sig Dispense Refill  . ALPRAZolam (XANAX) 0.25 MG tablet Take 1 tablet (0.25 mg total) by mouth daily as needed for anxiety. 30 tablet 1  . amLODipine (NORVASC) 2.5 MG tablet Take 1 tablet (2.5 mg total) by mouth daily. Please call to make appointment for further refills. 90 tablet 0  . amoxicillin-clavulanate (AUGMENTIN) 875-125 MG tablet Take 1 tablet by mouth 2 (two) times daily. 20 tablet 0  . anastrozole (ARIMIDEX) 1 MG tablet TAKE 1 TABLET BY MOUTH ONCE DAILY 90 tablet 3  . atorvastatin (LIPITOR) 20 MG tablet Take 1 1/2 tablets daily 270 tablet 3  . calcium-vitamin D (OSCAL WITH D) 500-200 MG-UNIT per tablet Take 1 tablet by mouth daily with breakfast.     . dofetilide (TIKOSYN) 250 MCG capsule Take 1 capsule (250 mcg total) by mouth 2 (two) times daily. 180 capsule 3  . hydroxychloroquine (PLAQUENIL) 200 MG tablet Take by mouth 2 (two) times daily.    Marland Kitchen KLOR-CON M20 20 MEQ tablet TAKE 1 TABLET BY  MOUTH TWICE DAILY 180 tablet 1  . lisinopril (PRINIVIL,ZESTRIL) 40 MG tablet TAKE 1 TABLET BY MOUTH IN THE MORNING 90 tablet 1  . magnesium oxide (MAG-OX) 400 MG tablet Take 1 tablet (400 mg total) by mouth daily. Take 400 mg twice a day with food (Patient taking differently: Take 400 mg by mouth daily. )    . metFORMIN (GLUCOPHAGE) 500 MG tablet TAKE 1 TABLET BY MOUTH ONCE DAILY 90 tablet 0  . nitroGLYCERIN (NITROSTAT) 0.4 MG SL tablet Place 1 tablet (0.4 mg total) under the tongue every 5 (five) minutes as needed for chest pain. 25 tablet 5  . rivaroxaban (XARELTO) 20 MG TABS tablet Take 1 tablet (20 mg total) by mouth daily with supper. 30 tablet 5  . warfarin (COUMADIN) 5 MG tablet Take 1 tablet daily except on Thursdays take 1 and 1/2 tablets. (Patient taking differently: Take 5 mg by mouth daily. Take 7.5 Monday, wed and Friday) 102 tablet 0  . warfarin (COUMADIN) 5 MG tablet TAKE 1 TO 1 & 1/2 (ONE TO ONE & ONE-HALF) TABLETS BY MOUTH ONCE DAILY 135 tablet 0   No current facility-administered medications for this visit.     Past Medical History:  Diagnosis Date  . AAA (abdominal aortic aneurysm) ([place])   . Acute diverticulitis   . Anxiety   . Arthritis   .  Atrial fibrillation ([place])    a. Dx 03/2011 - on tikosyn/coumadin.  Marland Kitchen CAD (coronary artery disease)    a. NSTEMI 02/2011: occ mid Cx, DES to OM2, residual nonobst LAD dz.  . Cancer ([place])    breast  . Diabetes mellitus   . Diverticulitis   . Diverticulitis of colon     2008, 04/2011, 12/2014, 08/2015  . 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 Hemangioma    liver  . HLD (hyperlipidemia)   . HTN (hypertension)   . Osteoporosis   . Tobacco abuse    stopped smoking 2012  . Ureteral obstruction    History of gross hematuria/right hydronephrosis 2/2 to uteropelvic junction obstruction, s/p cystoscopy in January 2007 with bilateral retrograde pyelography, right ureter arthroscopy, right ureteral stent placement, bladder biopsies,  stent removal since then.  . Wears dentures    top    Past Surgical History:  Procedure Laterality Date  . BIOPSY N/A 11/01/2015   Procedure: BIOPSY;  Surgeon: Danie Binder, MD;  Location: [facility];  Service: Endoscopy;  Laterality: N/A;  . BREAST LUMPECTOMY WITH NEEDLE LOCALIZATION AND AXILLARY SENTINEL LYMPH NODE BX Right 03/01/2014   Procedure: BREAST LUMPECTOMY WITH NEEDLE LOCALIZATION AND AXILLARY SENTINEL LYMPH NODE BX;  Surgeon: Edward Jolly, MD;  Location: [facility];  Service: General;  Laterality: Right;  . BREAST SURGERY    . CARDIAC CATHETERIZATION  2012   stent  . COLONOSCOPY WITH PROPOFOL N/A 06/29/2014   Dr. Wynetta Emery: universal diverticulosis  . CORONARY STENT PLACEMENT  2012  . ESOPHAGOGASTRODUODENOSCOPY (EGD) WITH PROPOFOL N/A 11/01/2015   Procedure: ESOPHAGOGASTRODUODENOSCOPY (EGD) WITH PROPOFOL;  Surgeon: Danie Binder, MD;  Location: [facility];  Service: Endoscopy;  Laterality: N/A;  . KNEE ARTHROSCOPY     left  . Right leg surgery  age 81   As a child for  ?scleroderma per patient  . TONSILLECTOMY AND ADENOIDECTOMY    . TUBAL LIGATION    . Ureteral surgery  2011   rt ureterostomy-stent    ROS:  ***    PHYSICAL EXAM There were no vitals taken for this visit.  GENERAL:  Well appearing NECK:  No jugular venous distention, waveform within normal limits, carotid upstroke brisk and symmetric, no bruits, no thyromegaly LUNGS:  Clear to auscultation bilaterally CHEST:  Unremarkable HEART:  PMI not displaced or sustained,S1 and S2 within normal limits, no S3, no S4, no clicks, no rubs, *** murmurs ABD:  Flat, positive bowel sounds normal in frequency in pitch, no bruits, no rebound, no guarding, no midline pulsatile mass, no hepatomegaly, no splenomegaly EXT:  2 plus pulses throughout, no edema, no cyanosis no clubbing   ***  GENERAL:  Well appearing NECK:  No jugular venous distention, waveform within normal limits, carotid upstroke brisk and  symmetric, no bruits, no thyromegaly LUNGS:  Clear to auscultation bilaterally BACK:  No CVA tenderness CHEST:  Unremarkable HEART:  PMI not displaced or sustained,S1 and S2 within normal limits, no S3, no S4, no clicks, no rubs, no murmurs ABD:  Flat, positive bowel sounds normal in frequency in pitch, no bruits, no rebound, no guarding, no midline pulsatile mass, no hepatomegaly, no splenomegaly EXT:  2 plus pulses throughout, no edema, no cyanosis no clubbing    EKG:  Sinus rhythm, rate ***, axis within normal limits, intervals within normal limits, no acute ST-T wave changes. 06/03/2018   ASSESSMENT AND PLAN  HYPERTENSION -  Her BP *** has been well  controlled.  No change in therapy.   Atrial fibrillation -  She is *** maintaining sinus rhythm on Tikosyn.  Her QT is normal.  I will check Mg level today.   CAD - ***  The patient has no new sypmtoms.  No further cardiovascular testing is indicated.  We will continue with aggressive risk reduction and meds as listed.

## 2018-06-04 ENCOUNTER — Ambulatory Visit: Payer: Medicare Other | Admitting: Cardiology

## 2018-06-05 ENCOUNTER — Ambulatory Visit: Payer: Medicare Other | Admitting: Family Medicine

## 2018-06-05 ENCOUNTER — Telehealth: Payer: Self-pay | Admitting: Family Medicine

## 2018-06-05 NOTE — Telephone Encounter (Signed)
Patient aware and verbalizes understanding. 

## 2018-06-05 NOTE — Telephone Encounter (Signed)
Waiting for GI is ok, I am asking for their help with long term diverticlitis recurrence care. We will work on the acute flares when necessary.   Laroy Apple, MD [facility] 06/05/2018, 3:12 PM

## 2018-06-06 ENCOUNTER — Other Ambulatory Visit: Payer: Self-pay

## 2018-06-06 DIAGNOSIS — Z170 Estrogen receptor positive status [ER+]: Principal | ICD-10-CM

## 2018-06-06 DIAGNOSIS — C50411 Malignant neoplasm of upper-outer quadrant of right female breast: Secondary | ICD-10-CM

## 2018-06-08 NOTE — Progress Notes (Signed)
Melody Casey came to visit today 2 hours early by mistake.  We offered to work Melody Casey, but according to the staff she was upset, refused lab work, and did not return for her visit later in the day.

## 2018-06-09 ENCOUNTER — Ambulatory Visit (HOSPITAL_COMMUNITY): Admission: RE | Admit: 2018-06-09 | Payer: Medicare Other | Source: Ambulatory Visit

## 2018-06-09 ENCOUNTER — Inpatient Hospital Stay: Payer: Medicare Other

## 2018-06-09 ENCOUNTER — Inpatient Hospital Stay: Payer: Medicare Other | Admitting: Oncology

## 2018-06-10 ENCOUNTER — Telehealth: Payer: Self-pay

## 2018-06-10 NOTE — Telephone Encounter (Signed)
Called to reschedule her missed appt from yesterday. LVM with call back number.

## 2018-06-10 NOTE — Telephone Encounter (Signed)
Spoke with patient concerning scheduling for 7/3 at 2:30. She accepted the appointment. Per 7/2 phone msg return

## 2018-06-10 NOTE — Progress Notes (Signed)
[place]  [phone]) [phone] [phone]     ID: [name] OB: [birth year]  MR#: [id number]  YPP#:[id number]  PCP: Timmothy Euler, MD  Patient Care Team: Timmothy Euler, MD as PCP - General (Family Medicine) Danie Binder, MD (Gastroenterology) Minus Breeding, MD as Consulting Physician (Cardiology) Nidya Bouyer, Virgie Dad, MD as Consulting Physician (Oncology) [name], MD as Attending Physician (Radiation Oncology) Christene Slates, MD as Physician Assistant (Radiology) Garlan Fair, MD as Consulting Physician (Gastroenterology) Hurley Cisco, MD as Consulting Physician (Rheumatology) Rosita Kea, PA-C as Consulting Physician (Family Medicine) Excell Seltzer, MD as Consulting Physician (General Surgery)  CHIEF COMPLAINT: Estrogen receptor positive breast cancer   CURRENT TREATMENT: Anastrozole  BREAST CANCER HISTORY: From the original intake note:   "Melody Casey" had routine breast mammography at [facility] 01/14/2014. The breast density was category B. A new cluster of heterogeneous calcifications were noted in the right breast and on 01/28/2014 the patient underwent stereotactic biopsy of this area with the pathology ([id number]) showing an invasive ductal carcinoma, grade 1, estrogen receptor 100% positive, with strong staining intensity; progesterone receptor 63% positive, with moderate staining intensity, with an MIB-1 of 26% and no HER-2 amplification, the ratio being 1.44 and the number per cell 2.80.  On 02/01/2014 the patient underwent bilateral MRI of the breast, with no enhancement associated with the previously biopsied area. However there was a separate 7 mm enhancing mass in the lower inner quadrant and biopsy of this mass 02/03/2014 showed benign breast parenchyma, with no atypia. Review of this pathology showed atypical ductal hyperplasia, and therefore excisional biopsy was recommended, performed 02/10/2014 and showing only  atypical ductal hyperplasia, with adenosis and calcifications ([id number]).  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 Tumor.   Her subsequent history is as detailed below  INTERVAL HISTORY: Melody Casey returns today for follow-up and treatment of her estrogen receptor positive breast cancer. She continues on anastrozole, with good tolerance. She denies issues with hot flashes or vaginal dryness.  REVIEW OF SYSTEMS: Melody Casey reports that she is seeing [facility] under Dr. Marella Chimes for her newly diagnosed RA. She notes that this year, she has had more difficulty with balance. She is taking prednisone as needed, but this is not helping. She notes that she took ibuprofen for pain this morning. She is also seeing Dr. Wendi Snipes for diverticulitis, and she will be scheduled for have a CT scan in 3 months to follow up. She notes that 2018 was difficult because of her husband's various health issues and hospitalizations. She denies unusual headaches, visual changes, nausea, vomiting, or dizziness. There has been no unusual cough, phlegm production, or pleurisy. This been no change in bowel or bladder habits. She denies unexplained fatigue or unexplained weight loss, bleeding, rash, or fever. A detailed review of systems was otherwise stable.    PAST MEDICAL HISTORY: Past Medical History:  Diagnosis Date  . AAA (abdominal aortic aneurysm) ([place])   . Acute diverticulitis   . Anxiety   . Arthritis   . Atrial fibrillation ([place])    a. Dx 03/2011 - on tikosyn/coumadin.  Marland Kitchen CAD (coronary artery disease)    a. NSTEMI 02/2011: occ mid Cx, DES to OM2, residual nonobst LAD dz.  . Cancer ([place])    breast  . Diabetes mellitus   . Diverticulitis   .  Diverticulitis of colon     2008, 04/2011, 12/2014, 08/2015  . Foot deformity 1947   right;  ??scleroderma  . Hemangioma    liver  . HLD (hyperlipidemia)   . HTN (hypertension)   . Osteoporosis   . Tobacco abuse    stopped smoking 2012  . Ureteral obstruction    History of gross hematuria/right hydronephrosis 2/2 to uteropelvic junction obstruction, s/p cystoscopy in January 2007 with bilateral retrograde pyelography, right ureter arthroscopy, right ureteral stent placement, bladder biopsies, stent removal since then.  . Wears dentures    top    PAST SURGICAL HISTORY: Past Surgical History:  Procedure Laterality Date  . BIOPSY N/A 11/01/2015   Procedure: BIOPSY;  Surgeon: Danie Binder, MD;  Location: [facility];  Service: Endoscopy;  Laterality: N/A;  . BREAST LUMPECTOMY WITH NEEDLE LOCALIZATION AND AXILLARY SENTINEL LYMPH NODE BX Right 03/01/2014   Procedure: BREAST LUMPECTOMY WITH NEEDLE LOCALIZATION AND AXILLARY SENTINEL LYMPH NODE BX;  Surgeon: Edward Jolly, MD;  Location: [place];  Service: General;  Laterality: Right;  . BREAST SURGERY    . CARDIAC CATHETERIZATION  2012   stent  . COLONOSCOPY WITH PROPOFOL N/A 06/29/2014   Dr. Wynetta Emery: universal diverticulosis  . CORONARY STENT PLACEMENT  2012  . ESOPHAGOGASTRODUODENOSCOPY (EGD) WITH PROPOFOL N/A 11/01/2015   Procedure: ESOPHAGOGASTRODUODENOSCOPY (EGD) WITH PROPOFOL;  Surgeon: Danie Binder, MD;  Location: [facility];  Service: Endoscopy;  Laterality: N/A;  . KNEE ARTHROSCOPY     left  . Right leg surgery  age 63   As a child for  ?scleroderma per patient  . TONSILLECTOMY AND ADENOIDECTOMY    . TUBAL LIGATION    . Ureteral surgery  2011   rt ureterostomy-stent    FAMILY HISTORY Family History  Problem Relation Age of Onset  . Lung cancer Father 71       deceased  . Cancer Father   . Heart failure Mother 63       deceased  . Heart disease Mother   . Diabetes Brother   . Hypertension Brother   . Cancer Brother        prostate  . Colon cancer Neg Hx   . Liver disease Neg Hx   The  patient's father died at the age of 46 with a history of lung cancer. He was a smoker and also had asbestos exposure. The patient's mother died at the age of 47. The patient had one brother. There is no other history of cancer in the family to her knowledge  GYNECOLOGIC HISTORY:  Menarche age 57, first live birth age 60. The patient stopped having periods approximately 1997. She did not take hormone replacement.   SOCIAL HISTORY:  Melody Casey works part time for [employer]. Her husband of 20+ years, [name] "[name]" Kohrs is a retired Futures trader. He has a history of head and neck cancer treated at [facility] years ago. Daughter Cheralyn Oliver lives in [name] and is a Scientist, clinical (histocompatibility and immunogenetics). Son [name] lives in [name] and he is an Conservator, museum/gallery. The patient has 7 grandchildren and 2 great-grandchildren. She attends [employer]     ADVANCED DIRECTIVES:  in place    HEALTH MAINTENANCE: Social History   Tobacco Use  . Smoking status: Former Smoker    Packs/day: 2.00    Years: 50.00    Pack years: 100.00    Types: Cigarettes    Last attempt  to quit: 02/08/2011    Years since quitting: 7.3  . Smokeless tobacco: Never Used  Substance Use Topics  . Alcohol use: No    Alcohol/week: 0.0 oz  . Drug use: No     Colonoscopy: never   PAP: 2013  Bone density: on 04/30/2017 showed a T score of -3.7 osteoporosis  Lipid panel:  Allergies  Allergen Reactions  . Miralax [Polyethylene Glycol] Swelling and Rash    Took [facility] brand developed rash. Patient states she tolerated name brand MiraLax in the past.  09/01/15. Patient states she had diffuse swelling including swelling of her lips.   . Vancomycin Rash and Shortness Of Breath  . Acetaminophen Hives  . Oxycodone-Acetaminophen Itching  . Sulfa Antibiotics Rash    All over rash  . Sulfacetamide Sodium Rash    All over rash  . Banana Other (See Comments)    Unknown    . Latex Rash  . Tape Rash    Current Outpatient Medications  Medication Sig Dispense Refill  . ALPRAZolam (XANAX) 0.25 MG tablet Take 1 tablet (0.25 mg total) by mouth daily as needed for anxiety. 30 tablet 1  . amLODipine (NORVASC) 2.5 MG tablet Take 1 tablet (2.5 mg total) by mouth daily. Please call to make appointment for further refills. 90 tablet 0  . amoxicillin-clavulanate (AUGMENTIN) 875-125 MG tablet Take 1 tablet by mouth 2 (two) times daily. 20 tablet 0  . anastrozole (ARIMIDEX) 1 MG tablet TAKE 1 TABLET BY MOUTH ONCE DAILY 90 tablet 3  . atorvastatin (LIPITOR) 20 MG tablet Take 1 1/2 tablets daily 270 tablet 3  . calcium-vitamin D (OSCAL WITH D) 500-200 MG-UNIT per tablet Take 1 tablet by mouth daily with breakfast.     . dofetilide (TIKOSYN) 250 MCG capsule Take 1 capsule (250 mcg total) by mouth 2 (two) times daily. 180 capsule 3  . hydroxychloroquine (PLAQUENIL) 200 MG tablet Take by mouth 2 (two) times daily.    Marland Kitchen KLOR-CON M20 20 MEQ tablet TAKE 1 TABLET BY MOUTH TWICE DAILY 180 tablet 1  . lisinopril (PRINIVIL,ZESTRIL) 40 MG tablet TAKE 1 TABLET BY MOUTH IN THE MORNING 90 tablet 1  . magnesium oxide (MAG-OX) 400 MG tablet Take 1 tablet (400 mg total) by mouth daily. Take 400 mg twice a day with food (Patient taking differently: Take 400 mg by mouth daily. )    . metFORMIN (GLUCOPHAGE) 500 MG tablet TAKE 1 TABLET BY MOUTH ONCE DAILY 90 tablet 0  . nitroGLYCERIN (NITROSTAT) 0.4 MG SL tablet Place 1 tablet (0.4 mg total) under the tongue every 5 (five) minutes as needed for chest pain. 25 tablet 5  . rivaroxaban (XARELTO) 20 MG TABS tablet Take 1 tablet (20 mg total) by mouth daily with supper. 30 tablet 5  . warfarin (COUMADIN) 5 MG tablet Take 1 tablet daily except on Thursdays take 1 and 1/2 tablets. (Patient taking differently: Take 5 mg by mouth daily. Take 7.5 Monday, wed and Friday) 102 tablet 0   No current facility-administered medications for this visit.      OBJECTIVE: Older white woman   Vitals:   06/11/18 1449  BP: 133/67  Pulse: 67  Resp: 18  Temp: 98.3 F (36.8 C)  SpO2: 98%     Body mass index is 30.35 kg/m.    ECOG FS:1 - Symptomatic but completely ambulatory   Sclerae unicteric, EOMs intact Oropharynx clear and moist No cervical or supraclavicular adenopathy Lungs no rales or rhonchi Heart regular rate and  rhythm Abd soft, nontender, positive bowel sounds MSK no focal spinal tenderness, no upper extremity lymphedema Neuro: nonfocal, well oriented, appropriate affect Breasts: Right breast is status post lumpectomy.  There is no evidence of local recurrence.  Left breast is benign.  Both axillae are benign.  LAB RESULTS:  CMP     Component Value Date/Time   NA 144 01/16/2018 1657   NA 134 (L) 06/06/2017 1259   K 4.3 01/16/2018 1657   K 4.5 06/06/2017 1259   CL 108 (H) 01/16/2018 1657   CO2 21 01/16/2018 1657   CO2 24 06/06/2017 1259   GLUCOSE 147 (H) 01/16/2018 1657   GLUCOSE 311 (H) 06/06/2017 1259   BUN 18 01/16/2018 1657   BUN 18.3 06/06/2017 1259   CREATININE 0.81 01/16/2018 1657   CREATININE 1.0 06/06/2017 1259   CALCIUM 10.6 (H) 01/16/2018 1657   CALCIUM 10.6 (H) 06/06/2017 1259   PROT 7.5 01/16/2018 1657   PROT 7.7 06/06/2017 1259   ALBUMIN 3.7 01/16/2018 1657   ALBUMIN 3.3 (L) 06/06/2017 1259   AST 17 01/16/2018 1657   AST 17 06/06/2017 1259   ALT 15 01/16/2018 1657   ALT 14 06/06/2017 1259   ALKPHOS 119 (H) 01/16/2018 1657   ALKPHOS 115 06/06/2017 1259   BILITOT 0.2 01/16/2018 1657   BILITOT 0.52 06/06/2017 1259   GFRNONAA 72 01/16/2018 1657   GFRNONAA 80 06/22/2015 1614   GFRAA 83 01/16/2018 1657   GFRAA >89 06/22/2015 1614    I No results found for: SPEP  Lab Results  Component Value Date   WBC 4.0 01/16/2018   NEUTROABS 1.9 01/16/2018   HGB 11.7 01/16/2018   HCT 34.6 01/16/2018   MCV 82 01/16/2018   PLT 177 01/16/2018      Chemistry      Component Value Date/Time   NA  144 01/16/2018 1657   NA 134 (L) 06/06/2017 1259   K 4.3 01/16/2018 1657   K 4.5 06/06/2017 1259   CL 108 (H) 01/16/2018 1657   CO2 21 01/16/2018 1657   CO2 24 06/06/2017 1259   BUN 18 01/16/2018 1657   BUN 18.3 06/06/2017 1259   CREATININE 0.81 01/16/2018 1657   CREATININE 1.0 06/06/2017 1259      Component Value Date/Time   CALCIUM 10.6 (H) 01/16/2018 1657   CALCIUM 10.6 (H) 06/06/2017 1259   ALKPHOS 119 (H) 01/16/2018 1657   ALKPHOS 115 06/06/2017 1259   AST 17 01/16/2018 1657   AST 17 06/06/2017 1259   ALT 15 01/16/2018 1657   ALT 14 06/06/2017 1259   BILITOT 0.2 01/16/2018 1657   BILITOT 0.52 06/06/2017 1259       No results found for: LABCA2  No components found for: LABCA125  No results for input(s): INR in the last 168 hours.  Urinalysis    Component Value Date/Time   COLORURINE YELLOW 09/01/2015 1800   APPEARANCEUR Cloudy (A) 03/21/2016 1344   LABSPEC 1.025 09/01/2015 1800   PHURINE 6.0 09/01/2015 1800   GLUCOSEU Negative 03/21/2016 1344   GLUCOSEU NEGATIVE 04/05/2008 0833   HGBUR SMALL (A) 09/01/2015 1800   BILIRUBINUR Negative 03/21/2016 1344   KETONESUR NEGATIVE 09/01/2015 1800   PROTEINUR Negative 03/21/2016 1344   PROTEINUR TRACE (A) 09/01/2015 1800   UROBILINOGEN negative 10/25/2015 0926   UROBILINOGEN 0.2 09/01/2015 1800   NITRITE Negative 03/21/2016 1344   NITRITE NEGATIVE 09/01/2015 1800   LEUKOCYTESUR 3+ (A) 03/21/2016 1344    STUDIES: Bilateral diagnostic mammography at [facility] June 2019  showed no evidence of malignancy.   ASSESSMENT: 72 y.o. [place] woman status post right breast upper outer quadrant biopsy  01/28/2014 for a clinical T1b N0, stage IA invasive ductal carcinoma, grade 1, estrogen and progesterone receptor positive, HER-2 not amplified, with an MIB-1 of 26%  (1) Biopsy of 2 additional suspicious areas in the right breast 02/03/2014 and 02/10/2014 showed only atypical ductal hyperplasia  (2) status post right lumpectomy  and sentinel lymph node sampling 03/01/2014 showed an additional 3 mm area of invasive ductal carcinoma, grade 1, with negative margins. The single sentinel lymph node was negative.  (3) Opted against adjuvant radiation given the marginal benefit of local recurrence  (4) started anastrozole 03/29/2014, interrupted May 2016 with joint swelling; restarted 06/20/15  (a) osteoporosis with T score -3.5 on bone density scan 04/11/2015  (b) bone density on 04/30/2017 showed a T score of -3.7 osteoporosis  PLAN: Melody Casey is now over 4 years out from definitive surgery for her breast cancer with no evidence of disease recurrence.  This is very favorable.  She continues on anastrozole.  The plan is to continue that one more year.  At the next visit here she will be ready to "graduate".  She had a very rough year last year because of her husband's illness and of course her new diagnosis of rheumatoid arthritis.  She will let me know if we need to see her before the next scheduled visit.  Magrinat, Virgie Dad, MD  06/11/18 3:11 PM Medical Oncology and Hematology [facility] [address] [place], [place] [address] Tel. [phone]    Fax. [phone]  Alice Rieger, am acting as scribe for Chauncey Cruel MD.  I, Lurline Del MD, have reviewed the above documentation for accuracy and completeness, and I agree with the above.

## 2018-06-11 ENCOUNTER — Inpatient Hospital Stay: Payer: Medicare Other | Attending: Oncology

## 2018-06-11 ENCOUNTER — Inpatient Hospital Stay (HOSPITAL_BASED_OUTPATIENT_CLINIC_OR_DEPARTMENT_OTHER): Payer: Medicare Other | Admitting: Oncology

## 2018-06-11 VITALS — BP 133/67 | HR 67 | Temp 98.3°F | Resp 18 | Ht 68.0 in | Wt 199.6 lb

## 2018-06-11 DIAGNOSIS — C50411 Malignant neoplasm of upper-outer quadrant of right female breast: Secondary | ICD-10-CM | POA: Diagnosis not present

## 2018-06-11 DIAGNOSIS — M810 Age-related osteoporosis without current pathological fracture: Secondary | ICD-10-CM | POA: Diagnosis not present

## 2018-06-11 DIAGNOSIS — Z170 Estrogen receptor positive status [ER+]: Secondary | ICD-10-CM | POA: Insufficient documentation

## 2018-06-11 DIAGNOSIS — Z79811 Long term (current) use of aromatase inhibitors: Secondary | ICD-10-CM

## 2018-06-11 MED ORDER — ANASTROZOLE 1 MG PO TABS: 1.0000 mg | ORAL_TABLET | Freq: Every day | ORAL | 3 refills | Status: DC

## 2018-06-16 LAB — PAN-ANCA
ANCA Proteinase 3: <3.5 U/mL (ref 0.0–3.5)
Atypical P-ANCA titer: <1:20 {titer}
C-ANCA: <1:20 {titer}

## 2018-06-17 ENCOUNTER — Other Ambulatory Visit: Payer: Self-pay | Admitting: Oncology

## 2018-06-18 DIAGNOSIS — M0579 Rheumatoid arthritis with rheumatoid factor of multiple sites without organ or systems involvement: Secondary | ICD-10-CM | POA: Diagnosis not present

## 2018-06-24 ENCOUNTER — Encounter: Payer: Self-pay | Admitting: Family Medicine

## 2018-06-24 ENCOUNTER — Ambulatory Visit (INDEPENDENT_AMBULATORY_CARE_PROVIDER_SITE_OTHER): Payer: Medicare Other | Admitting: Family Medicine

## 2018-06-24 VITALS — BP 101/59 | HR 74 | Temp 97.6°F | Ht 68.0 in | Wt 168.6 lb

## 2018-06-24 DIAGNOSIS — K5900 Constipation, unspecified: Secondary | ICD-10-CM | POA: Diagnosis not present

## 2018-06-24 DIAGNOSIS — Z7901 Long term (current) use of anticoagulants: Secondary | ICD-10-CM | POA: Diagnosis not present

## 2018-06-24 LAB — COAGUCHEK XS/INR WAIVED
INR: 4.4 — ABNORMAL HIGH (ref 0.9–1.1)
PROTHROMBIN TIME: 52.4 s

## 2018-06-24 MED ORDER — RIVAROXABAN 20 MG PO TABS: 20.0000 mg | ORAL_TABLET | Freq: Every day | ORAL | 5 refills | Status: DC

## 2018-06-24 NOTE — Progress Notes (Signed)
   HPI  Patient presents today with abdominal pain and for INR check.  Patient states that she started a course of prednisone about 2 weeks ago due to an RA flare.  She knew that her INR will be off.  She is currently taking as prescribed 1.5 tablets every day except for Tuesday and Thursday when she takes 1 tablet. No bleeding No diet changes.  Constipation Patient states that she had some left lower quadrant abdominal pain that she was very concerned was another episode of diverticulitis, however she felt better after a bowel movement.  She used stool softener for that  PMH: Smoking status noted ROS: Per HPI  Objective: BP (!) 101/59   Pulse 74   Temp 97.6 F (36.4 C) (Oral)   Ht 5\' 8"  (1.727 m)   Wt 168 lb 9.6 oz (76.5 kg)   BMI 25.64 kg/m  Gen: NAD, alert, cooperative with exam HEENT: NCAT CV: RRR, good S1/S2, no murmur Resp: CTABL, no wheezes, non-labored Abd: SNTND, BS present, no guarding or organomegaly Ext: No edema, warm Neuro: Alert and oriented, No gross deficits  Assessment and plan:  # chronic anticoagulation Patient is understandably frustrated with her INR fluctuations, prednisone his likely cause to this 1. We discussed a "prednisone dose" of her Coumadin, reduced by 25% to 1 pill once daily.  Dose 35 mg She was previously on 1.5 tablet every day except for Tuesday and Thursday when she took 1 tablet for complete weekly dose of 47.5 mg. 2 week INR check  #Constipation Left lower quadrant soreness and tenderness improved after bowel movement Unlikely recurrence of diverticulitis at this time, did have complete resolution after Augmentin last visit    Orders Placed This Encounter  Procedures  . CoaguChek XS/INR Waived    Meds ordered this encounter  Medications  . rivaroxaban (XARELTO) 20 MG TABS tablet    Sig: Take 1 tablet (20 mg total) by mouth daily with supper.    Dispense:  30 tablet    Refill:  [place], MD [place] 06/24/2018, 9:59 AM

## 2018-06-30 ENCOUNTER — Encounter: Payer: Self-pay | Admitting: Family Medicine

## 2018-06-30 ENCOUNTER — Ambulatory Visit (INDEPENDENT_AMBULATORY_CARE_PROVIDER_SITE_OTHER): Payer: Medicare Other | Admitting: Family Medicine

## 2018-06-30 VITALS — BP 138/75 | HR 72 | Temp 97.7°F | Ht 68.0 in | Wt 168.0 lb

## 2018-06-30 DIAGNOSIS — R399 Unspecified symptoms and signs involving the genitourinary system: Secondary | ICD-10-CM | POA: Diagnosis not present

## 2018-06-30 DIAGNOSIS — N3090 Cystitis, unspecified without hematuria: Secondary | ICD-10-CM | POA: Diagnosis not present

## 2018-06-30 LAB — URINALYSIS
BILIRUBIN UA: NEGATIVE
Glucose, UA: NEGATIVE
Ketones, UA: NEGATIVE
Nitrite, UA: POSITIVE — AB
PH UA: 7 (ref 5.0–7.5)
PROTEIN UA: NEGATIVE
RBC, UA: NEGATIVE
Specific Gravity, UA: 1.02 (ref 1.005–1.030)
Urobilinogen, Ur: 0.2 mg/dL (ref 0.2–1.0)

## 2018-06-30 MED ORDER — AMOXICILLIN 500 MG PO CAPS: 500.0000 mg | ORAL_CAPSULE | Freq: Three times a day (TID) | ORAL | 0 refills | Status: DC

## 2018-06-30 NOTE — Progress Notes (Signed)
Chief Complaint  Patient presents with  . Urinary Tract Infection    HPI  Patient presents today for  Frequency and urinary odor with cloudiness for several days. Denies fever . No flank pain. No nausea, vomiting.   PMH: Smoking status noted ROS: Per HPI  Objective: BP 138/75   Pulse 72   Temp 97.7 F (36.5 C) (Oral)   Ht 5\' 8"  (1.727 m)   Wt 168 lb (76.2 kg)   BMI 25.54 kg/m  Gen: NAD, alert, cooperative with exam HEENT: NCAT, EOMI, PERRL Abd: SNTND, BS present, no guarding or organomegaly Ext: No edema, warm Neuro: Alert and oriented, No gross deficits  Assessment and plan:  1. UTI symptoms   2. Cystitis     Meds ordered this encounter  Medications  . amoxicillin (AMOXIL) 500 MG capsule    Sig: Take 1 capsule (500 mg total) by mouth 3 (three) times daily.    Dispense:  21 capsule    Refill:  0    Orders Placed This Encounter  Procedures  . Urine Culture  . Urinalysis    Follow up as needed.  Claretta Fraise, MD

## 2018-07-02 LAB — URINE CULTURE

## 2018-07-03 DIAGNOSIS — L84 Corns and callosities: Secondary | ICD-10-CM | POA: Diagnosis not present

## 2018-07-03 DIAGNOSIS — B351 Tinea unguium: Secondary | ICD-10-CM | POA: Diagnosis not present

## 2018-07-03 DIAGNOSIS — E1151 Type 2 diabetes mellitus with diabetic peripheral angiopathy without gangrene: Secondary | ICD-10-CM | POA: Diagnosis not present

## 2018-07-03 DIAGNOSIS — M79675 Pain in left toe(s): Secondary | ICD-10-CM | POA: Diagnosis not present

## 2018-07-09 ENCOUNTER — Encounter: Payer: Self-pay | Admitting: Gastroenterology

## 2018-07-10 ENCOUNTER — Ambulatory Visit (INDEPENDENT_AMBULATORY_CARE_PROVIDER_SITE_OTHER): Payer: Medicare Other | Admitting: Family Medicine

## 2018-07-10 ENCOUNTER — Encounter: Payer: Self-pay | Admitting: Family Medicine

## 2018-07-10 VITALS — BP 124/72 | HR 72 | Temp 98.1°F | Ht 68.0 in | Wt 169.8 lb

## 2018-07-10 DIAGNOSIS — I10 Essential (primary) hypertension: Secondary | ICD-10-CM

## 2018-07-10 DIAGNOSIS — Z7901 Long term (current) use of anticoagulants: Secondary | ICD-10-CM | POA: Diagnosis not present

## 2018-07-10 DIAGNOSIS — M05741 Rheumatoid arthritis with rheumatoid factor of right hand without organ or systems involvement: Secondary | ICD-10-CM | POA: Diagnosis not present

## 2018-07-10 DIAGNOSIS — M05742 Rheumatoid arthritis with rheumatoid factor of left hand without organ or systems involvement: Secondary | ICD-10-CM | POA: Diagnosis not present

## 2018-07-10 DIAGNOSIS — I481 Persistent atrial fibrillation: Secondary | ICD-10-CM | POA: Diagnosis not present

## 2018-07-10 DIAGNOSIS — E1169 Type 2 diabetes mellitus with other specified complication: Secondary | ICD-10-CM

## 2018-07-10 DIAGNOSIS — E785 Hyperlipidemia, unspecified: Secondary | ICD-10-CM | POA: Diagnosis not present

## 2018-07-10 DIAGNOSIS — I4819 Other persistent atrial fibrillation: Secondary | ICD-10-CM

## 2018-07-10 LAB — COAGUCHEK XS/INR WAIVED
INR: 1.4 — AB (ref 0.9–1.1)
Prothrombin Time: 16.3 s

## 2018-07-10 LAB — BAYER DCA HB A1C WAIVED: HB A1C (BAYER DCA - WAIVED): 6.3 % (ref <7.0)

## 2018-07-10 NOTE — Progress Notes (Signed)
BP 124/72   Pulse 72   Temp 98.1 F (36.7 C) (Oral)   Ht 5' 8"  (1.727 m)   Wt 169 lb 12.8 oz (77 kg)   BMI 25.82 kg/m    Subjective:    Patient ID: Melody Casey, female    DOB: September 25, 1946, 72 y.o.   MRN: [id number]  HPI: Melody Casey is a 72 y.o. female presenting on 07/10/2018 for Coagulation Disorder and Rheumatoid Arthritis   HPI A. fib and chronic anticoagulation, recheck Patient has chronic A. fib and is on chronic anticoagulation is coming in today for recheck of this.  She denies any bleeding or bruising.  She denies any chest pain or palpitations currently  Rheumatoid arthritis Patient sees a rheumatologist for her rheumatoid arthritis and is being prescribed a prednisone Medrol Dosepak but tapers her that she is going to start today.  Hyperlipidemia Patient is coming in for recheck of his hyperlipidemia. The patient is currently taking Lipitor. They deny any issues with myalgias or history of liver damage from it. They deny any focal numbness or weakness or chest pain.   Hypertension Patient is currently on amlodipine and lisinopril, and their blood pressure today is 124/72. Patient denies any lightheadedness or dizziness. Patient denies headaches, blurred vision, chest pains, shortness of breath, or weakness. Denies any side effects from medication and is content with current medication.   Type 2 diabetes mellitus Patient comes in today for recheck of his diabetes. Patient has been currently taking metformin. Patient is currently on an ACE inhibitor/ARB. Patient has not seen an ophthalmologist this year. Patient denies any issues with their feet.   Relevant past medical, surgical, family and social history reviewed and updated as indicated. Interim medical history since our last visit reviewed. Allergies and medications reviewed and updated.  Review of Systems  Constitutional: Negative for chills and fever.  Eyes: Negative for visual disturbance.  Respiratory:  Negative for chest tightness and shortness of breath.   Cardiovascular: Negative for chest pain, palpitations and leg swelling.  Musculoskeletal: Positive for arthralgias. Negative for back pain and gait problem.  Skin: Negative for rash.  Neurological: Negative for dizziness, weakness, light-headedness, numbness and headaches.  Psychiatric/Behavioral: Negative for agitation and behavioral problems.  All other systems reviewed and are negative.   Per HPI unless specifically indicated above   Allergies as of 07/10/2018      Reactions   Miralax [polyethylene Glycol] Swelling, Rash   Took [facility] brand developed rash. Patient states she tolerated name brand MiraLax in the past. 09/01/15. Patient states she had diffuse swelling including swelling of her lips.    Vancomycin Rash, Shortness Of Breath   Acetaminophen Hives   Oxycodone-acetaminophen Itching   Sulfa Antibiotics Rash   All over rash   Sulfacetamide Sodium Rash   All over rash   Sulfasalazine Rash   All over rash All over rash   Banana Other (See Comments)   Unknown   Latex Rash   Tape Rash      Medication List        Accurate as of 07/10/18  1:42 PM. Always use your most recent med list.          ALPRAZolam 0.25 MG tablet Commonly known as:  XANAX Take 1 tablet (0.25 mg total) by mouth daily as needed for anxiety.   amLODipine 2.5 MG tablet Commonly known as:  NORVASC Take 1 tablet (2.5 mg total) by mouth daily. Please call to make appointment for further refills.  amoxicillin 500 MG capsule Commonly known as:  AMOXIL Take 1 capsule (500 mg total) by mouth 3 (three) times daily.   anastrozole 1 MG tablet Commonly known as:  ARIMIDEX Take 1 tablet (1 mg total) by mouth daily.   atorvastatin 20 MG tablet Commonly known as:  LIPITOR Take 1 1/2 tablets daily   calcium-vitamin D 500-200 MG-UNIT tablet Commonly known as:  OSCAL WITH D Take 1 tablet by mouth daily with breakfast.   dofetilide 250 MCG  capsule Commonly known as:  TIKOSYN Take 1 capsule (250 mcg total) by mouth 2 (two) times daily.   hydroxychloroquine 200 MG tablet Commonly known as:  PLAQUENIL Take by mouth 2 (two) times daily.   KLOR-CON M20 20 MEQ tablet Generic drug:  potassium chloride SA TAKE 1 TABLET BY MOUTH TWICE DAILY   lisinopril 40 MG tablet Commonly known as:  PRINIVIL,ZESTRIL TAKE 1 TABLET BY MOUTH IN THE MORNING   magnesium oxide 400 MG tablet Commonly known as:  MAG-OX Take 1 tablet (400 mg total) by mouth daily. Take 400 mg twice a day with food   metFORMIN 500 MG tablet Commonly known as:  GLUCOPHAGE TAKE 1 TABLET BY MOUTH ONCE DAILY   nitroGLYCERIN 0.4 MG SL tablet Commonly known as:  NITROSTAT Place 1 tablet (0.4 mg total) under the tongue every 5 (five) minutes as needed for chest pain.   predniSONE 5 MG tablet Commonly known as:  DELTASONE TAKE 1 TO 2 TABLETS BY MOUTH ONCE DAILY   warfarin 5 MG tablet Commonly known as:  COUMADIN Take as directed by the anticoagulation clinic. If you are unsure how to take this medication, talk to your nurse or doctor. Original instructions:  Take 1 tablet daily except on Thursdays take 1 and 1/2 tablets.          Objective:    BP 124/72   Pulse 72   Temp 98.1 F (36.7 C) (Oral)   Ht 5' 8"  (1.727 m)   Wt 169 lb 12.8 oz (77 kg)   BMI 25.82 kg/m   Wt Readings from Last 3 Encounters:  07/10/18 169 lb 12.8 oz (77 kg)  06/30/18 168 lb (76.2 kg)  06/24/18 168 lb 9.6 oz (76.5 kg)    Physical Exam  Constitutional: She is oriented to person, place, and time. She appears well-developed and well-nourished. No distress.  Eyes: Conjunctivae are normal.  Neck: Neck supple. No thyromegaly present.  Cardiovascular: Normal rate, regular rhythm, normal heart sounds and intact distal pulses.  No murmur heard. Pulmonary/Chest: Effort normal and breath sounds normal. No respiratory distress. She has no wheezes.  Musculoskeletal: Normal range of  motion. She exhibits no edema.  Lymphadenopathy:    She has no cervical adenopathy.  Neurological: She is alert and oriented to person, place, and time. Coordination normal.  Skin: Skin is warm and dry. No rash noted. She is not diaphoretic.  Psychiatric: She has a normal mood and affect. Her behavior is normal.  Nursing note and vitals reviewed.   Description   Keep Coumadin at 5 mg daily because she is going to take a Medrol dose taper pack of prednisone which will increase her prednisone  INR 1.4, return in 2 weeks with [place]:   Problem List Items Addressed This Visit      Cardiovascular and Mediastinum   Essential hypertension   Relevant Orders   CMP14+EGFR (Completed)   Persistent atrial fibrillation ([place])  Relevant Orders   CBC with Differential/Platelet (Completed)     Endocrine   Diabetes ([place])   Relevant Orders   Bayer DCA Hb A1c Waived (Completed)     Musculoskeletal and Integument   Rheumatoid arthritis ([place])     Other   HLD (hyperlipidemia)   Relevant Orders   Lipid panel (Completed)    Other Visit Diagnoses    Chronic anticoagulation    -  Primary   Relevant Orders   CoaguChek XS/INR Waived (Completed)   CBC with Differential/Platelet (Completed)       Follow up plan: Return in about 2 weeks (around 07/24/2018), or if symptoms worsen or fail to improve, for Coumadin recheck in 2 weeks with Sharyn Lull, follow-up with me in 3 months.  Counseling provided for all of the vaccine components Orders Placed This Encounter  Procedures  . CoaguChek XS/INR Waived  . Bayer DCA Hb A1c Waived  . CBC with Differential/Platelet  . CMP14+EGFR  . Lipid panel    [name], MD [facility] 07/10/2018, 1:42 PM

## 2018-07-11 LAB — CMP14+EGFR
A/G RATIO: 1.1 — AB (ref 1.2–2.2)
ALBUMIN: 3.7 g/dL (ref 3.5–4.8)
ALT: 12 IU/L (ref 0–32)
AST: 16 IU/L (ref 0–40)
Alkaline Phosphatase: 112 IU/L (ref 39–117)
BUN / CREAT RATIO: 18 (ref 12–28)
BUN: 15 mg/dL (ref 8–27)
Bilirubin Total: 0.3 mg/dL (ref 0.0–1.2)
CALCIUM: 10.4 mg/dL — AB (ref 8.7–10.3)
CO2: 20 mmol/L (ref 20–29)
CREATININE: 0.82 mg/dL (ref 0.57–1.00)
Chloride: 98 mmol/L (ref 96–106)
GFR calc Af Amer: 82 mL/min/{1.73_m2} (ref >59)
GFR, EST NON AFRICAN AMERICAN: 71 mL/min/{1.73_m2} (ref >59)
GLOBULIN, TOTAL: 3.5 g/dL (ref 1.5–4.5)
Glucose: 232 mg/dL — ABNORMAL HIGH (ref 65–99)
POTASSIUM: 5.3 mmol/L — AB (ref 3.5–5.2)
SODIUM: 133 mmol/L — AB (ref 134–144)
Total Protein: 7.2 g/dL (ref 6.0–8.5)

## 2018-07-11 LAB — CBC WITH DIFFERENTIAL/PLATELET
BASOS: 0 %
Basophils Absolute: 0 10*3/uL (ref 0.0–0.2)
EOS (ABSOLUTE): 0 10*3/uL (ref 0.0–0.4)
EOS: 0 %
HEMATOCRIT: 40.3 % (ref 34.0–46.6)
HEMOGLOBIN: 13 g/dL (ref 11.1–15.9)
IMMATURE GRANULOCYTES: 0 %
Immature Grans (Abs): 0 10*3/uL (ref 0.0–0.1)
LYMPHS ABS: 0.9 10*3/uL (ref 0.7–3.1)
Lymphs: 12 %
MCH: 28.4 pg (ref 26.6–33.0)
MCHC: 32.3 g/dL (ref 31.5–35.7)
MCV: 88 fL (ref 79–97)
MONOCYTES: 4 %
MONOS ABS: 0.3 10*3/uL (ref 0.1–0.9)
Neutrophils Absolute: 6.1 10*3/uL (ref 1.4–7.0)
Neutrophils: 84 %
Platelets: 228 10*3/uL (ref 150–450)
RBC: 4.58 x10E6/uL (ref 3.77–5.28)
RDW: 16.6 % — AB (ref 12.3–15.4)
WBC: 7.3 10*3/uL (ref 3.4–10.8)

## 2018-07-11 LAB — LIPID PANEL
CHOL/HDL RATIO: 2.9 ratio (ref 0.0–4.4)
Cholesterol, Total: 145 mg/dL (ref 100–199)
HDL: 50 mg/dL (ref >39)
LDL CALC: 68 mg/dL (ref 0–99)
TRIGLYCERIDES: 136 mg/dL (ref 0–149)
VLDL Cholesterol Cal: 27 mg/dL (ref 5–40)

## 2018-07-14 DIAGNOSIS — R5383 Other fatigue: Secondary | ICD-10-CM | POA: Diagnosis not present

## 2018-07-14 DIAGNOSIS — M0579 Rheumatoid arthritis with rheumatoid factor of multiple sites without organ or systems involvement: Secondary | ICD-10-CM | POA: Diagnosis not present

## 2018-07-14 DIAGNOSIS — Z6826 Body mass index (BMI) 26.0-26.9, adult: Secondary | ICD-10-CM | POA: Diagnosis not present

## 2018-07-14 DIAGNOSIS — M810 Age-related osteoporosis without current pathological fracture: Secondary | ICD-10-CM | POA: Diagnosis not present

## 2018-07-14 DIAGNOSIS — E663 Overweight: Secondary | ICD-10-CM | POA: Diagnosis not present

## 2018-07-14 DIAGNOSIS — I2510 Atherosclerotic heart disease of native coronary artery without angina pectoris: Secondary | ICD-10-CM | POA: Diagnosis not present

## 2018-07-14 DIAGNOSIS — C50911 Malignant neoplasm of unspecified site of right female breast: Secondary | ICD-10-CM | POA: Diagnosis not present

## 2018-07-14 DIAGNOSIS — I4891 Unspecified atrial fibrillation: Secondary | ICD-10-CM | POA: Diagnosis not present

## 2018-07-16 ENCOUNTER — Telehealth: Payer: Self-pay | Admitting: Cardiology

## 2018-07-16 MED ORDER — ATORVASTATIN CALCIUM 40 MG PO TABS: ORAL_TABLET | ORAL | 6 refills | Status: DC

## 2018-07-16 NOTE — Telephone Encounter (Signed)
Script was sent to pharmacy wrong  Reviewed chart and pt should be on Atorvastatin  60 mg qd . Correct script sent /cy

## 2018-07-16 NOTE — Telephone Encounter (Signed)
New problem    Pt has questions concerning her statin medication, because of the way it was written she will not have enough. Please call pt.

## 2018-07-18 ENCOUNTER — Other Ambulatory Visit: Payer: Self-pay

## 2018-07-18 ENCOUNTER — Other Ambulatory Visit: Payer: Self-pay | Admitting: Family Medicine

## 2018-07-18 DIAGNOSIS — Z1239 Encounter for other screening for malignant neoplasm of breast: Secondary | ICD-10-CM

## 2018-07-22 ENCOUNTER — Ambulatory Visit (INDEPENDENT_AMBULATORY_CARE_PROVIDER_SITE_OTHER): Payer: Medicare Other | Admitting: Family Medicine

## 2018-07-22 ENCOUNTER — Telehealth: Payer: Self-pay

## 2018-07-22 ENCOUNTER — Encounter: Payer: Self-pay | Admitting: Family Medicine

## 2018-07-22 VITALS — BP 99/63 | HR 91 | Temp 97.2°F | Ht 68.0 in | Wt 167.1 lb

## 2018-07-22 DIAGNOSIS — K5792 Diverticulitis of intestine, part unspecified, without perforation or abscess without bleeding: Secondary | ICD-10-CM

## 2018-07-22 DIAGNOSIS — R109 Unspecified abdominal pain: Secondary | ICD-10-CM | POA: Diagnosis not present

## 2018-07-22 DIAGNOSIS — K5900 Constipation, unspecified: Secondary | ICD-10-CM

## 2018-07-22 MED ORDER — CIPROFLOXACIN HCL 500 MG PO TABS: 500.0000 mg | ORAL_TABLET | Freq: Two times a day (BID) | ORAL | 0 refills | Status: DC

## 2018-07-22 MED ORDER — METRONIDAZOLE 500 MG PO TABS: 500.0000 mg | ORAL_TABLET | Freq: Two times a day (BID) | ORAL | 0 refills | Status: DC

## 2018-07-22 MED ORDER — LINACLOTIDE 145 MCG PO CAPS: 145.0000 ug | ORAL_CAPSULE | Freq: Every day | ORAL | 0 refills | Status: DC

## 2018-07-22 NOTE — Telephone Encounter (Signed)
Have her take half of her normal Coumadin dose every day while she is on the antibiotics and recheck 3 or 4 days after the antibiotics

## 2018-07-22 NOTE — Progress Notes (Signed)
Subjective:  Patient ID: Melody Casey, female    DOB: 1946-08-01  Age: 72 y.o. MRN: [id number]  CC: Abdominal Pain (pt here today c/o pain on left side of abdomen, can't eat and nausea which she thinks is related to her diverticulitis)   HPI Melody Casey presents for moderately severe LLQ pain. Has been constipated. Feels distended, bloated. Allergic to miralax. No fever. Appetite decreased. Has been taking amoxicillin and feels sx getting worse. Has hx of diverticulitis and this is just like that.  Depression screen [facility] 2/9 07/10/2018 06/30/2018 06/24/2018  Decreased Interest 0 0 1  Down, Depressed, Hopeless 0 0 1  PHQ - 2 Score 0 0 2  Altered sleeping - - 1  Tired, decreased energy - - 1  Change in appetite - - 1  Feeling bad or failure about yourself  - - 1  Trouble concentrating - - 1  Moving slowly or fidgety/restless - - 1  Suicidal thoughts - - 1  PHQ-9 Score - - 9  Difficult doing work/chores - - -  Some recent data might be hidden    History Melody Casey has a past medical history of AAA (abdominal aortic aneurysm) ([place]), Acute diverticulitis, Anxiety, Arthritis, Atrial fibrillation ([place]), CAD (coronary artery disease), Cancer ([place]), Diabetes mellitus, Diverticulitis, Diverticulitis of colon ( ), Foot deformity (1947), Hemangioma, HLD (hyperlipidemia), HTN (hypertension), Osteoporosis, Tobacco abuse, Ureteral obstruction, and Wears dentures.   She has a past surgical history that includes Tonsillectomy and adenoidectomy; Knee arthroscopy; Tubal ligation; Right leg surgery (age 73); Ureteral surgery (2011); Coronary stent placement (2012); Cardiac catheterization (2012); Breast lumpectomy with needle localization and axillary sentinel lymph node bx (Right, 03/01/2014); Colonoscopy with propofol (N/A, 06/29/2014); Esophagogastroduodenoscopy (egd) with propofol (N/A, 11/01/2015); biopsy (N/A, 11/01/2015); and Breast surgery.   Her family history includes Cancer in her brother and father;  Diabetes in her brother; Heart disease in her mother; Heart failure (age of onset: 45) in her mother; Hypertension in her brother; Lung cancer (age of onset: 42) in her father.She reports that she quit smoking about 7 years ago. Her smoking use included cigarettes. She has a 100.00 pack-year smoking history. She has never used smokeless tobacco. She reports that she does not drink alcohol or use drugs.    ROS Review of Systems  Constitutional: Negative.   HENT: Negative.   Eyes: Negative for visual disturbance.  Respiratory: Negative for shortness of breath.   Cardiovascular: Negative for chest pain.  Gastrointestinal: Positive for abdominal distention, abdominal pain and constipation. Negative for anal bleeding, blood in stool, diarrhea and rectal pain.  Musculoskeletal: Negative for arthralgias.    Objective:  BP 99/63   Pulse 91   Temp (!) 97.2 F (36.2 C) (Oral)   Ht _0  (1.727 m)   Wt 167 lb 2 oz (75.8 kg)   BMI 25.41 kg/m   BP Readings from Last 3 Encounters:  07/22/18 99/63  07/10/18 124/72  06/30/18 138/75    Wt Readings from Last 3 Encounters:  07/22/18 167 lb 2 oz (75.8 kg)  07/10/18 169 lb 12.8 oz (77 kg)  06/30/18 168 lb (76.2 kg)     Physical Exam  Constitutional: She is oriented to person, place, and time. She appears well-developed and well-nourished. She appears ill.  HENT:  Head: Normocephalic and atraumatic.  Cardiovascular: Normal rate and regular rhythm.  No murmur heard. Pulmonary/Chest: Effort normal and breath sounds normal.  Abdominal: Soft. Bowel sounds are normal. She exhibits mass (LLQ. feels like colon  distended with stool). There is tenderness in the left lower quadrant. There is no rebound and no guarding.  Neurological: She is alert and oriented to person, place, and time.  Skin: Skin is warm and dry.  Psychiatric: She has a normal mood and affect. Her behavior is normal.      Assessment & Plan:   Melody Casey was seen today for  abdominal pain.  Diagnoses and all orders for this visit:  Diverticulitis  Stomach pain -     CBC with Differential/Platelet -     CMP14+EGFR  Constipation, unspecified constipation type  Other orders -     linaclotide (LINZESS) 145 MCG CAPS capsule; Take 1 capsule (145 mcg total) by mouth daily before breakfast. -     metroNIDAZOLE (FLAGYL) 500 MG tablet; Take 1 tablet (500 mg total) by mouth 2 (two) times daily for 10 days. -     ciprofloxacin (CIPRO) 500 MG tablet; Take 1 tablet (500 mg total) by mouth 2 (two) times daily for 10 days.       I am having Melody Casey start on linaclotide, metroNIDAZOLE, and ciprofloxacin. I am also having her maintain her calcium-vitamin D, nitroGLYCERIN, warfarin, magnesium oxide, ALPRAZolam, dofetilide, KLOR-CON M20, hydroxychloroquine, lisinopril, amLODipine, metFORMIN, anastrozole, predniSONE, amoxicillin, and atorvastatin.  Allergies as of 07/22/2018      Reactions   Miralax [polyethylene Glycol] Swelling, Rash   Took [facility] brand developed rash. Patient states she tolerated name brand MiraLax in the past. 09/01/15. Patient states she had diffuse swelling including swelling of her lips.    Vancomycin Rash, Shortness Of Breath   Acetaminophen Hives   Oxycodone-acetaminophen Itching   Sulfa Antibiotics Rash   All over rash   Sulfacetamide Sodium Rash   All over rash   Sulfasalazine Rash   All over rash All over rash   Banana Other (See Comments)   Unknown   Latex Rash   Tape Rash      Medication List        Accurate as of 07/22/18  6:29 PM. Always use your most recent med list.          ALPRAZolam 0.25 MG tablet Commonly known as:  XANAX Take 1 tablet (0.25 mg total) by mouth daily as needed for anxiety.   amLODipine 2.5 MG tablet Commonly known as:  NORVASC Take 1 tablet (2.5 mg total) by mouth daily. Please call to make appointment for further refills.   amoxicillin 500 MG capsule Commonly known as:  AMOXIL Take 1  capsule (500 mg total) by mouth 3 (three) times daily.   anastrozole 1 MG tablet Commonly known as:  ARIMIDEX Take 1 tablet (1 mg total) by mouth daily.   atorvastatin 40 MG tablet Commonly known as:  LIPITOR Take 1 1/2 tablets daily   calcium-vitamin D 500-200 MG-UNIT tablet Commonly known as:  OSCAL WITH D Take 1 tablet by mouth daily with breakfast.   ciprofloxacin 500 MG tablet Commonly known as:  CIPRO Take 1 tablet (500 mg total) by mouth 2 (two) times daily for 10 days.   dofetilide 250 MCG capsule Commonly known as:  TIKOSYN Take 1 capsule (250 mcg total) by mouth 2 (two) times daily.   hydroxychloroquine 200 MG tablet Commonly known as:  PLAQUENIL Take by mouth 2 (two) times daily.   KLOR-CON M20 20 MEQ tablet Generic drug:  potassium chloride SA TAKE 1 TABLET BY MOUTH TWICE DAILY   linaclotide 145 MCG Caps capsule Commonly known as:  LINZESS Take  1 capsule (145 mcg total) by mouth daily before breakfast.   lisinopril 40 MG tablet Commonly known as:  PRINIVIL,ZESTRIL TAKE 1 TABLET BY MOUTH IN THE MORNING   magnesium oxide 400 MG tablet Commonly known as:  MAG-OX Take 1 tablet (400 mg total) by mouth daily. Take 400 mg twice a day with food   metFORMIN 500 MG tablet Commonly known as:  GLUCOPHAGE TAKE 1 TABLET BY MOUTH ONCE DAILY   metroNIDAZOLE 500 MG tablet Commonly known as:  FLAGYL Take 1 tablet (500 mg total) by mouth 2 (two) times daily for 10 days.   nitroGLYCERIN 0.4 MG SL tablet Commonly known as:  NITROSTAT Place 1 tablet (0.4 mg total) under the tongue every 5 (five) minutes as needed for chest pain.   predniSONE 5 MG tablet Commonly known as:  DELTASONE TAKE 1 TO 2 TABLETS BY MOUTH ONCE DAILY   warfarin 5 MG tablet Commonly known as:  COUMADIN Take as directed by the anticoagulation clinic. If you are unsure how to take this medication, talk to your nurse or doctor. Original instructions:  Take 1 tablet daily except on Thursdays take 1  and 1/2 tablets.        Follow-up: Return in about 3 days (around 07/25/2018), or if symptoms worsen or fail to improve.  Claretta Fraise, M.D.

## 2018-07-22 NOTE — Telephone Encounter (Signed)
Dr Dettingers patient Cipro and Flagyl can increase her Warfarin  Do you still wnt to proceed?

## 2018-07-23 LAB — CMP14+EGFR
ALBUMIN: 3.5 g/dL (ref 3.5–4.8)
ALT: 10 IU/L (ref 0–32)
AST: 14 IU/L (ref 0–40)
Albumin/Globulin Ratio: 1.2 (ref 1.2–2.2)
Alkaline Phosphatase: 110 IU/L (ref 39–117)
BILIRUBIN TOTAL: 0.9 mg/dL (ref 0.0–1.2)
BUN / CREAT RATIO: 22 (ref 12–28)
BUN: 32 mg/dL — AB (ref 8–27)
CALCIUM: 10.4 mg/dL — AB (ref 8.7–10.3)
CHLORIDE: 93 mmol/L — AB (ref 96–106)
CO2: 18 mmol/L — ABNORMAL LOW (ref 20–29)
Creatinine, Ser: 1.43 mg/dL — ABNORMAL HIGH (ref 0.57–1.00)
GFR, EST AFRICAN AMERICAN: 42 mL/min/{1.73_m2} — AB (ref >59)
GFR, EST NON AFRICAN AMERICAN: 36 mL/min/{1.73_m2} — AB (ref >59)
GLUCOSE: 134 mg/dL — AB (ref 65–99)
Globulin, Total: 3 g/dL (ref 1.5–4.5)
Potassium: 4.6 mmol/L (ref 3.5–5.2)
Sodium: 132 mmol/L — ABNORMAL LOW (ref 134–144)
TOTAL PROTEIN: 6.5 g/dL (ref 6.0–8.5)

## 2018-07-23 LAB — CBC WITH DIFFERENTIAL/PLATELET
BASOS ABS: 0 10*3/uL (ref 0.0–0.2)
BASOS: 0 %
EOS (ABSOLUTE): 0 10*3/uL (ref 0.0–0.4)
Eos: 0 %
HEMOGLOBIN: 13.3 g/dL (ref 11.1–15.9)
Hematocrit: 40.4 % (ref 34.0–46.6)
IMMATURE GRANS (ABS): 0 10*3/uL (ref 0.0–0.1)
IMMATURE GRANULOCYTES: 0 %
LYMPHS: 9 %
Lymphocytes Absolute: 1.4 10*3/uL (ref 0.7–3.1)
MCH: 28.5 pg (ref 26.6–33.0)
MCHC: 32.9 g/dL (ref 31.5–35.7)
MCV: 87 fL (ref 79–97)
MONOCYTES: 7 %
Monocytes Absolute: 1 10*3/uL — ABNORMAL HIGH (ref 0.1–0.9)
NEUTROS ABS: 12.6 10*3/uL — AB (ref 1.4–7.0)
NEUTROS PCT: 84 %
Platelets: 204 10*3/uL (ref 150–450)
RBC: 4.67 x10E6/uL (ref 3.77–5.28)
RDW: 17.4 % — ABNORMAL HIGH (ref 12.3–15.4)
WBC: 15.1 10*3/uL — ABNORMAL HIGH (ref 3.4–10.8)

## 2018-07-23 NOTE — Telephone Encounter (Signed)
Left message for patient to call.

## 2018-07-24 ENCOUNTER — Other Ambulatory Visit: Payer: Self-pay

## 2018-07-24 ENCOUNTER — Inpatient Hospital Stay (HOSPITAL_COMMUNITY)
Admission: EM | Admit: 2018-07-24 | Discharge: 2018-08-06 | DRG: 853 | Disposition: A | Discharge status: Rehabilitation Facility | Payer: Medicare Other | Attending: Internal Medicine | Admitting: Internal Medicine

## 2018-07-24 ENCOUNTER — Emergency Department (HOSPITAL_COMMUNITY): Payer: Medicare Other

## 2018-07-24 ENCOUNTER — Encounter (HOSPITAL_COMMUNITY): Payer: Self-pay | Admitting: Emergency Medicine

## 2018-07-24 DIAGNOSIS — Z4659 Encounter for fitting and adjustment of other gastrointestinal appliance and device: Secondary | ICD-10-CM

## 2018-07-24 DIAGNOSIS — N131 Hydronephrosis with ureteral stricture, not elsewhere classified: Secondary | ICD-10-CM | POA: Diagnosis not present

## 2018-07-24 DIAGNOSIS — F419 Anxiety disorder, unspecified: Secondary | ICD-10-CM | POA: Diagnosis present

## 2018-07-24 DIAGNOSIS — I714 Abdominal aortic aneurysm, without rupture, unspecified: Secondary | ICD-10-CM | POA: Diagnosis present

## 2018-07-24 DIAGNOSIS — I959 Hypotension, unspecified: Secondary | ICD-10-CM | POA: Diagnosis not present

## 2018-07-24 DIAGNOSIS — Z9089 Acquired absence of other organs: Secondary | ICD-10-CM

## 2018-07-24 DIAGNOSIS — K567 Ileus, unspecified: Secondary | ICD-10-CM

## 2018-07-24 DIAGNOSIS — D6859 Other primary thrombophilia: Secondary | ICD-10-CM | POA: Diagnosis present

## 2018-07-24 DIAGNOSIS — I97191 Other postprocedural cardiac functional disturbances following other surgery: Secondary | ICD-10-CM | POA: Diagnosis not present

## 2018-07-24 DIAGNOSIS — Z91040 Latex allergy status: Secondary | ICD-10-CM

## 2018-07-24 DIAGNOSIS — K5720 Diverticulitis of large intestine with perforation and abscess without bleeding: Secondary | ICD-10-CM | POA: Diagnosis present

## 2018-07-24 DIAGNOSIS — J9601 Acute respiratory failure with hypoxia: Secondary | ICD-10-CM | POA: Diagnosis not present

## 2018-07-24 DIAGNOSIS — Z888 Allergy status to other drugs, medicaments and biological substances status: Secondary | ICD-10-CM

## 2018-07-24 DIAGNOSIS — Z8249 Family history of ischemic heart disease and other diseases of the circulatory system: Secondary | ICD-10-CM

## 2018-07-24 DIAGNOSIS — Z01810 Encounter for preprocedural cardiovascular examination: Secondary | ICD-10-CM | POA: Diagnosis not present

## 2018-07-24 DIAGNOSIS — I480 Paroxysmal atrial fibrillation: Secondary | ICD-10-CM

## 2018-07-24 DIAGNOSIS — L940 Localized scleroderma [morphea]: Secondary | ICD-10-CM | POA: Diagnosis present

## 2018-07-24 DIAGNOSIS — Z4682 Encounter for fitting and adjustment of non-vascular catheter: Secondary | ICD-10-CM | POA: Diagnosis not present

## 2018-07-24 DIAGNOSIS — A419 Sepsis, unspecified organism: Principal | ICD-10-CM | POA: Diagnosis present

## 2018-07-24 DIAGNOSIS — M069 Rheumatoid arthritis, unspecified: Secondary | ICD-10-CM | POA: Diagnosis not present

## 2018-07-24 DIAGNOSIS — Z9851 Tubal ligation status: Secondary | ICD-10-CM

## 2018-07-24 DIAGNOSIS — Z9889 Other specified postprocedural states: Secondary | ICD-10-CM | POA: Diagnosis not present

## 2018-07-24 DIAGNOSIS — I2510 Atherosclerotic heart disease of native coronary artery without angina pectoris: Secondary | ICD-10-CM | POA: Diagnosis present

## 2018-07-24 DIAGNOSIS — R1011 Right upper quadrant pain: Secondary | ICD-10-CM | POA: Diagnosis not present

## 2018-07-24 DIAGNOSIS — Z833 Family history of diabetes mellitus: Secondary | ICD-10-CM

## 2018-07-24 DIAGNOSIS — Z886 Allergy status to analgesic agent status: Secondary | ICD-10-CM

## 2018-07-24 DIAGNOSIS — K5790 Diverticulosis of intestine, part unspecified, without perforation or abscess without bleeding: Secondary | ICD-10-CM | POA: Diagnosis present

## 2018-07-24 DIAGNOSIS — K658 Other peritonitis: Secondary | ICD-10-CM | POA: Diagnosis not present

## 2018-07-24 DIAGNOSIS — E872 Acidosis: Secondary | ICD-10-CM | POA: Diagnosis not present

## 2018-07-24 DIAGNOSIS — J9690 Respiratory failure, unspecified, unspecified whether with hypoxia or hypercapnia: Secondary | ICD-10-CM | POA: Diagnosis not present

## 2018-07-24 DIAGNOSIS — E860 Dehydration: Secondary | ICD-10-CM | POA: Diagnosis present

## 2018-07-24 DIAGNOSIS — K9189 Other postprocedural complications and disorders of digestive system: Secondary | ICD-10-CM | POA: Diagnosis not present

## 2018-07-24 DIAGNOSIS — M216X1 Other acquired deformities of right foot: Secondary | ICD-10-CM | POA: Diagnosis present

## 2018-07-24 DIAGNOSIS — Z79899 Other long term (current) drug therapy: Secondary | ICD-10-CM

## 2018-07-24 DIAGNOSIS — D62 Acute posthemorrhagic anemia: Secondary | ICD-10-CM | POA: Diagnosis not present

## 2018-07-24 DIAGNOSIS — R110 Nausea: Secondary | ICD-10-CM | POA: Diagnosis not present

## 2018-07-24 DIAGNOSIS — Z170 Estrogen receptor positive status [ER+]: Secondary | ICD-10-CM

## 2018-07-24 DIAGNOSIS — I10 Essential (primary) hypertension: Secondary | ICD-10-CM | POA: Diagnosis not present

## 2018-07-24 DIAGNOSIS — K631 Perforation of intestine (nontraumatic): Secondary | ICD-10-CM | POA: Diagnosis not present

## 2018-07-24 DIAGNOSIS — Z978 Presence of other specified devices: Secondary | ICD-10-CM

## 2018-07-24 DIAGNOSIS — R0902 Hypoxemia: Secondary | ICD-10-CM | POA: Diagnosis not present

## 2018-07-24 DIAGNOSIS — R6521 Severe sepsis with septic shock: Secondary | ICD-10-CM | POA: Diagnosis not present

## 2018-07-24 DIAGNOSIS — F411 Generalized anxiety disorder: Secondary | ICD-10-CM | POA: Diagnosis not present

## 2018-07-24 DIAGNOSIS — I4581 Long QT syndrome: Secondary | ICD-10-CM | POA: Diagnosis not present

## 2018-07-24 DIAGNOSIS — Z91018 Allergy to other foods: Secondary | ICD-10-CM

## 2018-07-24 DIAGNOSIS — K5792 Diverticulitis of intestine, part unspecified, without perforation or abscess without bleeding: Secondary | ICD-10-CM | POA: Diagnosis not present

## 2018-07-24 DIAGNOSIS — R109 Unspecified abdominal pain: Secondary | ICD-10-CM | POA: Diagnosis not present

## 2018-07-24 DIAGNOSIS — I481 Persistent atrial fibrillation: Secondary | ICD-10-CM | POA: Diagnosis not present

## 2018-07-24 DIAGNOSIS — Z7952 Long term (current) use of systemic steroids: Secondary | ICD-10-CM

## 2018-07-24 DIAGNOSIS — K5780 Diverticulitis of intestine, part unspecified, with perforation and abscess without bleeding: Secondary | ICD-10-CM | POA: Diagnosis not present

## 2018-07-24 DIAGNOSIS — E119 Type 2 diabetes mellitus without complications: Secondary | ICD-10-CM | POA: Diagnosis present

## 2018-07-24 DIAGNOSIS — K668 Other specified disorders of peritoneum: Secondary | ICD-10-CM | POA: Diagnosis present

## 2018-07-24 DIAGNOSIS — Z882 Allergy status to sulfonamides status: Secondary | ICD-10-CM

## 2018-07-24 DIAGNOSIS — R000 Tachycardia, unspecified: Secondary | ICD-10-CM | POA: Diagnosis not present

## 2018-07-24 DIAGNOSIS — M79609 Pain in unspecified limb: Secondary | ICD-10-CM | POA: Diagnosis not present

## 2018-07-24 DIAGNOSIS — F329 Major depressive disorder, single episode, unspecified: Secondary | ICD-10-CM | POA: Diagnosis present

## 2018-07-24 DIAGNOSIS — Z8042 Family history of malignant neoplasm of prostate: Secondary | ICD-10-CM

## 2018-07-24 DIAGNOSIS — E785 Hyperlipidemia, unspecified: Secondary | ICD-10-CM | POA: Diagnosis present

## 2018-07-24 DIAGNOSIS — M1712 Unilateral primary osteoarthritis, left knee: Secondary | ICD-10-CM | POA: Diagnosis present

## 2018-07-24 DIAGNOSIS — R1310 Dysphagia, unspecified: Secondary | ICD-10-CM | POA: Diagnosis not present

## 2018-07-24 DIAGNOSIS — J9600 Acute respiratory failure, unspecified whether with hypoxia or hypercapnia: Secondary | ICD-10-CM | POA: Diagnosis not present

## 2018-07-24 DIAGNOSIS — Z881 Allergy status to other antibiotic agents status: Secondary | ICD-10-CM

## 2018-07-24 DIAGNOSIS — G9340 Encephalopathy, unspecified: Secondary | ICD-10-CM | POA: Diagnosis not present

## 2018-07-24 DIAGNOSIS — Z7984 Long term (current) use of oral hypoglycemic drugs: Secondary | ICD-10-CM

## 2018-07-24 DIAGNOSIS — Z7901 Long term (current) use of anticoagulants: Secondary | ICD-10-CM

## 2018-07-24 DIAGNOSIS — N170 Acute kidney failure with tubular necrosis: Secondary | ICD-10-CM | POA: Diagnosis present

## 2018-07-24 DIAGNOSIS — M810 Age-related osteoporosis without current pathological fracture: Secondary | ICD-10-CM | POA: Diagnosis present

## 2018-07-24 DIAGNOSIS — E871 Hypo-osmolality and hyponatremia: Secondary | ICD-10-CM | POA: Diagnosis present

## 2018-07-24 DIAGNOSIS — E876 Hypokalemia: Secondary | ICD-10-CM | POA: Diagnosis not present

## 2018-07-24 DIAGNOSIS — Z91048 Other nonmedicinal substance allergy status: Secondary | ICD-10-CM

## 2018-07-24 DIAGNOSIS — N179 Acute kidney failure, unspecified: Secondary | ICD-10-CM

## 2018-07-24 DIAGNOSIS — K8020 Calculus of gallbladder without cholecystitis without obstruction: Secondary | ICD-10-CM | POA: Diagnosis not present

## 2018-07-24 DIAGNOSIS — Z8673 Personal history of transient ischemic attack (TIA), and cerebral infarction without residual deficits: Secondary | ICD-10-CM

## 2018-07-24 DIAGNOSIS — Z9049 Acquired absence of other specified parts of digestive tract: Secondary | ICD-10-CM | POA: Diagnosis not present

## 2018-07-24 DIAGNOSIS — N132 Hydronephrosis with renal and ureteral calculous obstruction: Secondary | ICD-10-CM | POA: Diagnosis not present

## 2018-07-24 DIAGNOSIS — Z87440 Personal history of urinary (tract) infections: Secondary | ICD-10-CM

## 2018-07-24 DIAGNOSIS — R5381 Other malaise: Secondary | ICD-10-CM | POA: Diagnosis not present

## 2018-07-24 DIAGNOSIS — M7989 Other specified soft tissue disorders: Secondary | ICD-10-CM | POA: Diagnosis not present

## 2018-07-24 DIAGNOSIS — K560 Paralytic ileus: Secondary | ICD-10-CM | POA: Diagnosis not present

## 2018-07-24 DIAGNOSIS — T45515A Adverse effect of anticoagulants, initial encounter: Secondary | ICD-10-CM | POA: Diagnosis present

## 2018-07-24 DIAGNOSIS — R1084 Generalized abdominal pain: Secondary | ICD-10-CM | POA: Diagnosis not present

## 2018-07-24 DIAGNOSIS — I482 Chronic atrial fibrillation: Secondary | ICD-10-CM | POA: Diagnosis present

## 2018-07-24 DIAGNOSIS — I723 Aneurysm of iliac artery: Secondary | ICD-10-CM | POA: Diagnosis present

## 2018-07-24 DIAGNOSIS — D509 Iron deficiency anemia, unspecified: Secondary | ICD-10-CM | POA: Diagnosis present

## 2018-07-24 DIAGNOSIS — Z955 Presence of coronary angioplasty implant and graft: Secondary | ICD-10-CM

## 2018-07-24 DIAGNOSIS — Z79811 Long term (current) use of aromatase inhibitors: Secondary | ICD-10-CM

## 2018-07-24 DIAGNOSIS — Z9861 Coronary angioplasty status: Secondary | ICD-10-CM

## 2018-07-24 DIAGNOSIS — F418 Other specified anxiety disorders: Secondary | ICD-10-CM | POA: Diagnosis present

## 2018-07-24 DIAGNOSIS — R911 Solitary pulmonary nodule: Secondary | ICD-10-CM | POA: Diagnosis present

## 2018-07-24 DIAGNOSIS — Z87891 Personal history of nicotine dependence: Secondary | ICD-10-CM

## 2018-07-24 DIAGNOSIS — R600 Localized edema: Secondary | ICD-10-CM | POA: Diagnosis not present

## 2018-07-24 DIAGNOSIS — I4891 Unspecified atrial fibrillation: Secondary | ICD-10-CM | POA: Diagnosis not present

## 2018-07-24 DIAGNOSIS — C50411 Malignant neoplasm of upper-outer quadrant of right female breast: Secondary | ICD-10-CM | POA: Diagnosis present

## 2018-07-24 DIAGNOSIS — I252 Old myocardial infarction: Secondary | ICD-10-CM

## 2018-07-24 DIAGNOSIS — E118 Type 2 diabetes mellitus with unspecified complications: Secondary | ICD-10-CM

## 2018-07-24 DIAGNOSIS — J9811 Atelectasis: Secondary | ICD-10-CM | POA: Diagnosis not present

## 2018-07-24 DIAGNOSIS — K659 Peritonitis, unspecified: Secondary | ICD-10-CM | POA: Diagnosis not present

## 2018-07-24 DIAGNOSIS — Z801 Family history of malignant neoplasm of trachea, bronchus and lung: Secondary | ICD-10-CM

## 2018-07-24 LAB — GLUCOSE, CAPILLARY
GLUCOSE-CAPILLARY: 111 mg/dL — AB (ref 70–99)
Glucose-Capillary: 83 mg/dL (ref 70–99)

## 2018-07-24 LAB — COMPREHENSIVE METABOLIC PANEL
ALK PHOS: 120 U/L (ref 38–126)
ALT: 15 U/L (ref 0–44)
ANION GAP: 14 (ref 5–15)
AST: 20 U/L (ref 15–41)
Albumin: 2.7 g/dL — ABNORMAL LOW (ref 3.5–5.0)
BILIRUBIN TOTAL: 1.8 mg/dL — AB (ref 0.3–1.2)
BUN: 58 mg/dL — ABNORMAL HIGH (ref 8–23)
CALCIUM: 9.5 mg/dL (ref 8.9–10.3)
CO2: 16 mmol/L — ABNORMAL LOW (ref 22–32)
CREATININE: 2.18 mg/dL — AB (ref 0.44–1.00)
Chloride: 94 mmol/L — ABNORMAL LOW (ref 98–111)
GFR calc non Af Amer: 21 mL/min — ABNORMAL LOW (ref >60)
GFR, EST AFRICAN AMERICAN: 24 mL/min — AB (ref >60)
Glucose, Bld: 152 mg/dL — ABNORMAL HIGH (ref 70–99)
Potassium: 4.6 mmol/L (ref 3.5–5.1)
SODIUM: 124 mmol/L — AB (ref 135–145)
Total Protein: 6.8 g/dL (ref 6.5–8.1)

## 2018-07-24 LAB — CBC WITH DIFFERENTIAL/PLATELET
Basophils Absolute: 0 10*3/uL (ref 0.0–0.1)
Basophils Relative: 0 %
Eosinophils Absolute: 0 10*3/uL (ref 0.0–0.7)
Eosinophils Relative: 0 %
HCT: 37 % (ref 36.0–46.0)
Hemoglobin: 12.8 g/dL (ref 12.0–15.0)
LYMPHS ABS: 0.4 10*3/uL — AB (ref 0.7–4.0)
Lymphocytes Relative: 7 %
MCH: 28.5 pg (ref 26.0–34.0)
MCHC: 34.6 g/dL (ref 30.0–36.0)
MCV: 82.4 fL (ref 78.0–100.0)
MONO ABS: 0.6 10*3/uL (ref 0.1–1.0)
Monocytes Relative: 12 %
NEUTROS PCT: 81 %
Neutro Abs: 4.2 10*3/uL (ref 1.7–7.7)
PLATELETS: 193 10*3/uL (ref 150–400)
RBC: 4.49 MIL/uL (ref 3.87–5.11)
RDW: 17.1 % — AB (ref 11.5–15.5)
WBC: 5.2 10*3/uL (ref 4.0–10.5)

## 2018-07-24 LAB — PROTIME-INR
INR: 3.01
INR: 1.26
PROTHROMBIN TIME: 15.7 s — AB (ref 11.4–15.2)
Prothrombin Time: 31 seconds — ABNORMAL HIGH (ref 11.4–15.2)

## 2018-07-24 LAB — ABO/RH: ABO/RH(D): B POS

## 2018-07-24 LAB — LIPASE, BLOOD: LIPASE: 24 U/L (ref 11–51)

## 2018-07-24 LAB — LACTIC ACID, PLASMA
LACTIC ACID, VENOUS: 2.4 mmol/L — AB (ref 0.5–1.9)
LACTIC ACID, VENOUS: 2.2 mmol/L — AB (ref 0.5–1.9)

## 2018-07-24 LAB — TYPE AND SCREEN
ABO/RH(D): B POS
ANTIBODY SCREEN: NEGATIVE

## 2018-07-24 MED ORDER — PIPERACILLIN-TAZOBACTAM 3.375 G IVPB
3.3750 g | Freq: Three times a day (TID) | INTRAVENOUS | Status: AC
  Administered 2018-07-24 – 2018-07-30 (×19): 3.375 g via INTRAVENOUS
  Filled 2018-07-24 (×19): qty 50

## 2018-07-24 MED ORDER — ONDANSETRON HCL 4 MG PO TABS: 4.0000 mg | ORAL_TABLET | Freq: Four times a day (QID) | ORAL | Status: DC | PRN

## 2018-07-24 MED ORDER — METRONIDAZOLE IN NACL 5-0.79 MG/ML-% IV SOLN
500.0000 mg | Freq: Three times a day (TID) | INTRAVENOUS | Status: DC
  Administered 2018-07-24: 500 mg via INTRAVENOUS
  Filled 2018-07-24: qty 100

## 2018-07-24 MED ORDER — INSULIN ASPART 100 UNIT/ML ~~LOC~~ SOLN
0.0000 [IU] | SUBCUTANEOUS | Status: DC
  Administered 2018-07-26: 2 [IU] via SUBCUTANEOUS
  Administered 2018-07-26 (×2): 1 [IU] via SUBCUTANEOUS
  Administered 2018-07-27: 3 [IU] via SUBCUTANEOUS
  Administered 2018-07-27 (×2): 2 [IU] via SUBCUTANEOUS
  Administered 2018-07-27: 3 [IU] via SUBCUTANEOUS
  Administered 2018-07-27: 2 [IU] via SUBCUTANEOUS
  Administered 2018-07-27 (×2): 3 [IU] via SUBCUTANEOUS
  Administered 2018-07-28: 1 [IU] via SUBCUTANEOUS
  Administered 2018-07-28: 2 [IU] via SUBCUTANEOUS
  Administered 2018-07-28: 1 [IU] via SUBCUTANEOUS
  Administered 2018-07-28 – 2018-07-29 (×4): 2 [IU] via SUBCUTANEOUS
  Administered 2018-07-29 – 2018-08-03 (×7): 1 [IU] via SUBCUTANEOUS

## 2018-07-24 MED ORDER — ONDANSETRON HCL 4 MG/2ML IJ SOLN: 4.0000 mg | Freq: Four times a day (QID) | INTRAMUSCULAR | Status: DC | PRN

## 2018-07-24 MED ORDER — SODIUM CHLORIDE 0.9 % IV SOLN
2.0000 g | INTRAVENOUS | Status: DC
  Administered 2018-07-24: 2 g via INTRAVENOUS
  Filled 2018-07-24: qty 20

## 2018-07-24 MED ORDER — SODIUM CHLORIDE 0.9 % IV SOLN
INTRAVENOUS | Status: DC
  Administered 2018-07-24: 18:00:00 via INTRAVENOUS

## 2018-07-24 MED ORDER — MORPHINE SULFATE (PF) 2 MG/ML IV SOLN
2.0000 mg | INTRAVENOUS | Status: DC | PRN
  Administered 2018-07-24 – 2018-07-25 (×3): 2 mg via INTRAVENOUS
  Filled 2018-07-24 (×3): qty 1

## 2018-07-24 MED ORDER — VITAMIN K1 10 MG/ML IJ SOLN
5.0000 mg | Freq: Once | INTRAMUSCULAR | Status: AC
  Administered 2018-07-24: 5 mg via SUBCUTANEOUS
  Filled 2018-07-24: qty 1

## 2018-07-24 MED ORDER — ACETAMINOPHEN 325 MG PO TABS: 650.0000 mg | ORAL_TABLET | Freq: Four times a day (QID) | ORAL | Status: DC | PRN

## 2018-07-24 MED ORDER — PROTHROMBIN COMPLEX CONC HUMAN 500 UNITS IV KIT
2159.0000 [IU] | PACK | Status: AC
  Administered 2018-07-24: 2159 [IU] via INTRAVENOUS
  Filled 2018-07-24: qty 2159

## 2018-07-24 MED ORDER — SODIUM CHLORIDE 0.9% IV SOLUTION
Freq: Once | INTRAVENOUS | Status: AC
  Administered 2018-07-24: 23:00:00 via INTRAVENOUS

## 2018-07-24 MED ORDER — ONDANSETRON HCL 4 MG/2ML IJ SOLN
4.0000 mg | Freq: Once | INTRAMUSCULAR | Status: AC
  Administered 2018-07-24: 4 mg via INTRAVENOUS
  Filled 2018-07-24: qty 2

## 2018-07-24 MED ORDER — SODIUM CHLORIDE 0.9 % IV BOLUS
1000.0000 mL | Freq: Once | INTRAVENOUS | Status: AC
  Administered 2018-07-24: 1000 mL via INTRAVENOUS

## 2018-07-24 MED ORDER — ACETAMINOPHEN 650 MG RE SUPP: 650.0000 mg | Freq: Four times a day (QID) | RECTAL | Status: DC | PRN

## 2018-07-24 NOTE — ED Provider Notes (Addendum)
[facility] EMERGENCY DEPARTMENT Provider Note   CSN: [id number] Arrival date & time: 07/24/18  0909     History   Chief Complaint Chief Complaint  Patient presents with  . Abdominal Pain    diverticulitis flare    HPI Melody Casey is a 72 y.o. female with a history as outlined below, most significant for diverticulitis, diabetes, atrial fibrillation on Coumadin and history of AAA presenting with now 3-day history of abdominal pain which started as a left lower quadrant pain and she suspected she was having a diverticulitis flare.  She was seen by her PCP 2 days ago and started on Flagyl and Cipro but she endorses worsening abdominal pain which is now more generalized, distention along with nausea and vomiting.  She has been unable to keep down any medications today and had very little intake yesterday.  She has had no bowel movements the past 4 to 5 days.  Denies rectal bleeding.  She endorses persistent nausea, weakness and severe abdominal pain.  She was unable to get up this morning unassisted.  The history is provided by the patient.    Past Medical History:  Diagnosis Date  . AAA (abdominal aortic aneurysm) ([place])   . Acute diverticulitis   . Anxiety   . Arthritis   . Atrial fibrillation ([place])    a. Dx 03/2011 - on tikosyn/coumadin.  Marland Kitchen CAD (coronary artery disease)    a. NSTEMI 02/2011: occ mid Cx, DES to OM2, residual nonobst LAD dz.  . Cancer ([place])    breast  . Diabetes mellitus   . Diverticulitis   . Diverticulitis of colon     2008, 04/2011, 12/2014, 08/2015  . 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 Hemangioma    liver  . HLD (hyperlipidemia)   . HTN (hypertension)   . Osteoporosis   . Tobacco abuse    stopped smoking 2012  . Ureteral obstruction    History of gross hematuria/right hydronephrosis 2/2 to uteropelvic junction obstruction, s/p cystoscopy in January 2007 with bilateral retrograde pyelography, right ureter arthroscopy, right ureteral stent  placement, bladder biopsies, stent removal since then.  . Wears dentures    top    Patient Active Problem List   Diagnosis Date Noted  . Osteoporosis 08/27/2017  . Low vitamin D level 06/04/2017  . Frequent UTI 10/25/2015  . Microcytic anemia 08/30/2015  . Diverticulosis 08/30/2015  . Hemangioma   . TIA (transient ischemic attack) 08/13/2015  . Atrial fibrillation with rapid ventricular response ([place])   . Rheumatoid arthritis ([place]) 06/20/2015  . AAA (abdominal aortic aneurysm) without rupture ([place]) 01/28/2015  . Persistent atrial fibrillation ([place]) 11/17/2014  . History of scleroderma 02/17/2014  . Malignant neoplasm of upper-outer quadrant of right breast in female, estrogen receptor positive ([place]) 02/01/2014  . Anxiety and depression 12/07/2011  . High risk medication use 06/14/2011  . CAD (coronary artery disease)   . Diabetes ([place]) 06/07/2008  . HLD (hyperlipidemia) 07/22/2007  . Essential hypertension 07/22/2007    Past Surgical History:  Procedure Laterality Date  . BIOPSY N/A 11/01/2015   Procedure: BIOPSY;  Surgeon: Danie Binder, MD;  Location: [facility];  Service: Endoscopy;  Laterality: N/A;  . BREAST LUMPECTOMY WITH NEEDLE LOCALIZATION AND AXILLARY SENTINEL LYMPH NODE BX Right 03/01/2014   Procedure: BREAST LUMPECTOMY WITH NEEDLE LOCALIZATION AND AXILLARY SENTINEL LYMPH NODE BX;  Surgeon: Edward Jolly, MD;  Location: [facility];  Service: General;  Laterality: Right;  . BREAST  SURGERY    . CARDIAC CATHETERIZATION  2012   stent  . COLONOSCOPY WITH PROPOFOL N/A 06/29/2014   Dr. Wynetta Emery: universal diverticulosis  . CORONARY STENT PLACEMENT  2012  . ESOPHAGOGASTRODUODENOSCOPY (EGD) WITH PROPOFOL N/A 11/01/2015   Procedure: ESOPHAGOGASTRODUODENOSCOPY (EGD) WITH PROPOFOL;  Surgeon: Danie Binder, MD;  Location: [facility];  Service: Endoscopy;  Laterality: N/A;  . KNEE ARTHROSCOPY     left  . Right leg surgery  age 67   As a child for  ?scleroderma per  patient  . TONSILLECTOMY AND ADENOIDECTOMY    . TUBAL LIGATION    . Ureteral surgery  2011   rt ureterostomy-stent     OB History   None      Home Medications    Prior to Admission medications   Medication Sig Start Date End Date Taking? Authorizing Provider  ALPRAZolam (XANAX) 0.25 MG tablet Take 1 tablet (0.25 mg total) by mouth daily as needed for anxiety. 04/11/17   Dettinger, [name], MD  amLODipine (NORVASC) 2.5 MG tablet Take 1 tablet (2.5 mg total) by mouth daily. Please call to make appointment for further refills. 05/06/18   Minus Breeding, MD  amoxicillin (AMOXIL) 500 MG capsule Take 1 capsule (500 mg total) by mouth 3 (three) times daily. 06/30/18   Claretta Fraise, MD  anastrozole (ARIMIDEX) 1 MG tablet Take 1 tablet (1 mg total) by mouth daily. 06/11/18   Magrinat, Virgie Dad, MD  atorvastatin (LIPITOR) 40 MG tablet Take 1 1/2 tablets daily 07/16/18   Minus Breeding, MD  calcium-vitamin D (OSCAL WITH D) 500-200 MG-UNIT per tablet Take 1 tablet by mouth daily with breakfast.     [provider]  ciprofloxacin (CIPRO) 500 MG tablet Take 1 tablet (500 mg total) by mouth 2 (two) times daily for 10 days. 07/22/18 08/01/18  Claretta Fraise, MD  dofetilide (TIKOSYN) 250 MCG capsule Take 1 capsule (250 mcg total) by mouth 2 (two) times daily. 01/14/18   Minus Breeding, MD  hydroxychloroquine (PLAQUENIL) 200 MG tablet Take by mouth 2 (two) times daily.    [provider]  KLOR-CON M20 20 MEQ tablet TAKE 1 TABLET BY MOUTH TWICE DAILY 02/24/18   Troy Sine, MD  linaclotide [facility]) 145 MCG CAPS capsule Take 1 capsule (145 mcg total) by mouth daily before breakfast. 07/22/18   Claretta Fraise, MD  lisinopril (PRINIVIL,ZESTRIL) 40 MG tablet TAKE 1 TABLET BY MOUTH IN THE MORNING 05/06/18   Timmothy Euler, MD  magnesium oxide (MAG-OX) 400 MG tablet Take 1 tablet (400 mg total) by mouth daily. Take 400 mg twice a day with food Patient taking differently: Take 400 mg by mouth  daily.  03/26/17   Cherre Robins, PharmD  metFORMIN (GLUCOPHAGE) 500 MG tablet TAKE 1 TABLET BY MOUTH ONCE DAILY 05/12/18   Timmothy Euler, MD  metroNIDAZOLE (FLAGYL) 500 MG tablet Take 1 tablet (500 mg total) by mouth 2 (two) times daily for 10 days. 07/22/18 08/01/18  Claretta Fraise, MD  nitroGLYCERIN (NITROSTAT) 0.4 MG SL tablet Place 1 tablet (0.4 mg total) under the tongue every 5 (five) minutes as needed for chest pain. 06/30/15   Minus Breeding, MD  predniSONE (DELTASONE) 5 MG tablet TAKE 1 TO 2 TABLETS BY MOUTH ONCE DAILY 06/26/18   [provider]  warfarin (COUMADIN) 5 MG tablet Take 1 tablet daily except on Thursdays take 1 and 1/2 tablets. Patient taking differently: Take 5 mg by mouth daily.  10/01/16   Cherre Robins, PharmD  Family History Family History  Problem Relation Age of Onset  . Lung cancer Father 65       deceased  . Cancer Father   . Heart failure Mother 17       deceased  . Heart disease Mother   . Diabetes Brother   . Hypertension Brother   . Cancer Brother        prostate  . Colon cancer Neg Hx   . Liver disease Neg Hx     Social History Social History   Tobacco Use  . Smoking status: Former Smoker    Packs/day: 2.00    Years: 50.00    Pack years: 100.00    Types: Cigarettes    Last attempt to quit: 02/08/2011    Years since quitting: 7.4  . Smokeless tobacco: Never Used  Substance Use Topics  . Alcohol use: No    Alcohol/week: 0.0 standard drinks  . Drug use: No     Allergies   Miralax [polyethylene glycol]; Vancomycin; Acetaminophen; Oxycodone-acetaminophen; Sulfa antibiotics; Sulfacetamide sodium; Sulfasalazine; Banana; Latex; and Tape   Review of Systems Review of Systems  Constitutional: Positive for fatigue. Negative for fever.  HENT: Negative for congestion and sore throat.   Eyes: Negative.   Respiratory: Negative for chest tightness and shortness of breath.   Cardiovascular: Negative for chest pain.  Gastrointestinal:  Positive for abdominal distention, abdominal pain, constipation, nausea and vomiting.  Genitourinary: Negative.   Musculoskeletal: Negative for arthralgias, joint swelling and neck pain.  Skin: Negative.  Negative for rash and wound.  Neurological: Positive for weakness. Negative for dizziness, light-headedness, numbness and headaches.  Psychiatric/Behavioral: Negative.      Physical Exam Updated Vital Signs BP (!) 94/53   Pulse (!) 110   Temp 97.9 F (36.6 C) (Oral)   Resp 16   Ht 5\' 8"  (1.727 m)   Wt 72.6 kg   SpO2 91%   BMI 24.33 kg/m   Physical Exam  Constitutional: She appears well-developed and well-nourished. She appears ill. She appears distressed.  HENT:  Head: Normocephalic and atraumatic.  Mouth/Throat: Mucous membranes are pale and dry.  Eyes: Conjunctivae are normal.  Neck: Normal range of motion.  Cardiovascular: Regular rhythm, normal heart sounds and intact distal pulses. Tachycardia present.  Pulmonary/Chest: Effort normal and breath sounds normal. She has no wheezes.  Abdominal: She exhibits distension. Bowel sounds are decreased. There is generalized tenderness. There is rigidity and guarding.  Musculoskeletal: Normal range of motion.  Neurological: She is alert.  Skin: Skin is warm and dry.  Psychiatric: She has a normal mood and affect.  Nursing note and vitals reviewed.    ED Treatments / Results  Labs (all labs ordered are listed, but only abnormal results are displayed) Labs Reviewed  COMPREHENSIVE METABOLIC PANEL - Abnormal; Notable for the following components:      Result Value   Sodium 124 (*)    Chloride 94 (*)    CO2 16 (*)    Glucose, Bld 152 (*)    BUN 58 (*)    Creatinine, Ser 2.18 (*)    Albumin 2.7 (*)    Total Bilirubin 1.8 (*)    GFR calc non Af Amer 21 (*)    GFR calc Af Amer 24 (*)    All other components within normal limits  CBC WITH DIFFERENTIAL/PLATELET - Abnormal; Notable for the following components:   RDW 17.1 (*)     Lymphs Abs 0.4 (*)    All other components  within normal limits  LACTIC ACID, PLASMA - Abnormal; Notable for the following components:   Lactic Acid, Venous 2.4 (*)    All other components within normal limits  PROTIME-INR - Abnormal; Notable for the following components:   Prothrombin Time 31.0 (*)    All other components within normal limits  CULTURE, BLOOD (ROUTINE X 2)  CULTURE, BLOOD (ROUTINE X 2)  LIPASE, BLOOD  URINALYSIS, ROUTINE W REFLEX MICROSCOPIC    EKG EKG Interpretation  Date/Time:  Thursday July 24 2018 10:00:30 EDT Ventricular Rate:  109 PR Interval:    QRS Duration: 95 QT Interval:  293 QTC Calculation: 395 R Axis:   47 Text Interpretation:  Sinus tachycardia Atrial premature complexes Abnormal R-wave progression, early transition Repol abnrm suggests ischemia, anterolateral Since last tracing rate faster Confirmed by Noemi Chapel [phone]) on 07/24/2018 11:32:08 AM   Radiology Ct Abdomen Pelvis Wo Contrast  Result Date: 07/24/2018 CLINICAL DATA:  Abdominal pain.  History of diverticulitis. EXAM: CT ABDOMEN AND PELVIS WITHOUT CONTRAST TECHNIQUE: Multidetector CT imaging of the abdomen and pelvis was performed following the standard protocol without IV contrast. COMPARISON:  CT abdomen pelvis dated Apr 10, 2016. FINDINGS: Lower chest: No acute abnormality. New 9 x 5 mm pulmonary nodule in the left lower lobe (series 4, image 13). Hepatobiliary: Hepatic steatosis. No focal liver abnormality. Small layering gallstones. No gallbladder wall thickening or biliary dilatation. Pancreas: Unremarkable. No pancreatic ductal dilatation or surrounding inflammatory changes. Spleen: Normal in size without focal abnormality. Adrenals/Urinary Tract: The adrenal glands are unremarkable. Unchanged bilateral renal simple cysts. Bilateral hilar renal vascular calcifications. Punctate nonobstructive calculi in both kidneys. Unchanged chronic mild right hydronephrosis with decompressed  right ureter. The bladder is unremarkable. Stomach/Bowel: Moderate colonic diverticulosis. Large amount of stool within the sigmoid colon with moderate wall thickening and prominent surrounding inflammatory changes. Focal disruption of the anterior sigmoid colonic wall (series 2, image 62 with adjacent 2.0 x 6.8 x 5.4 cm gas and stool containing collection. The colonic wall defect measures approximately 1.6 cm. The stomach and small bowel are unremarkable.  No obstruction. Vascular/Lymphatic: Interval enlargement of the infrarenal abdominal aortic aneurysm, now measuring 3.8 cm, previously 3.3 cm. Aortoiliac atherosclerosis. Multiple reactive mesenteric lymph nodes in the pelvis. No enlarged abdominal or pelvic lymph nodes. Reproductive: Multiple calcified uterine fibroids.  No adnexal mass. Other: Small amount of free fluid in the mesentery and pelvis. Multiple small foci of pneumoperitoneum near the site of sigmoid colonic perforation and in the mesentery. Musculoskeletal: No acute or significant osseous findings. IMPRESSION: 1. Large amount of stool within the sigmoid colon with moderate wall thickening and prominent surrounding inflammatory changes with focal perforation of the anterior sigmoid colonic wall with adjacent 2.0 x 6.8 x 5.4 cm gas and stool containing collection. Additional small foci of pneumoperitoneum and free fluid near the site of perforation and within the mesentery. 2. Etiology of underlying sigmoid colonic inflammation could be related to stercoral colitis or diverticulitis. 3. New 7 mm aggregate pulmonary nodule in the left lower lobe. Non-contrast chest CT at 6-12 months is recommended. If the nodule is stable at time of repeat CT, then future CT at 18-24 months (from today's scan) is considered optional for low-risk patients, but is recommended for high-risk patients. This recommendation follows the consensus statement: Guidelines for Management of Incidental Pulmonary Nodules Detected on  CT Images: From the Fleischner Society 2017; Radiology 2017; 284:228-243. 4. Enlargement of the infrarenal abdominal aortic aneurysm, now measuring 3.8 cm. Recommend followup by ultrasound  in 2 years. This recommendation follows ACR consensus guidelines: White Paper of the ACR Incidental Findings Committee II on Vascular Findings. J Am Coll Radiol 2013; 10:789-794. 5.  Aortic atherosclerosis (ICD10-I70.0). 6. Hepatic steatosis. 7. Cholelithiasis. 8. Bilateral nonobstructive nephrolithiasis. 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 Critical Value/emergent results were called by telephone at the time of interpretation on 07/24/2018 at 11:36 am to Dr. Noemi Chapel, who verbally acknowledged these results. Electronically Signed   By: Titus Dubin M.D.   On: 07/24/2018 11:44   Dg Chest Portable 1 View  Result Date: 07/24/2018 CLINICAL DATA:  History of breast carcinoma.  Upper abdominal pain EXAM: PORTABLE CHEST 1 VIEW COMPARISON:  June 30, 2015 FINDINGS: Upright image obtained. Lungs are clear. Heart is upper normal in size with pulmonary vascularity normal. No adenopathy. There is aortic atherosclerosis. No evident bone lesions. No pneumoperitoneum is demonstrated on this study. IMPRESSION: No edema or consolidation. No demonstrable pneumoperitoneum. There is aortic atherosclerosis. Aortic Atherosclerosis (ICD10-I70.0). Electronically Signed   By: Lowella Grip III M.D.   On: 07/24/2018 09:57    Procedures Procedures (including critical care time)  Medications Ordered in ED Medications  cefTRIAXone (ROCEPHIN) 2 g in sodium chloride 0.9 % 100 mL IVPB (2 g Intravenous New Bag/Given 07/24/18 1142)  metroNIDAZOLE (FLAGYL) IVPB 500 mg (has no administration in time range)  sodium chloride 0.9 % bolus 1,000 mL (1,000 mLs Intravenous New Bag/Given 07/24/18 1143)  ondansetron (ZOFRAN) injection 4 mg (4 mg Intravenous Given 07/24/18 0942)  sodium chloride 0.9 % bolus  1,000 mL (0 mLs Intravenous Stopped 07/24/18 1114)     Initial Impression / Assessment and Plan / ED Course  I have reviewed the triage vital signs and the nursing notes.  Pertinent labs & imaging results that were available during my care of the patient were reviewed by me and considered in my medical decision making (see chart for details).     CT imaging 04/2016 --  3.3 cm abdominal aortic aneurysm with 1.7 cm right iliac and 1.6 cm left iliac aneurysms.   Labs reviewed, pt lactate elevated.  Second NS bolus ordered along with blood cx followed by initiation of abx .  Code sepsis called.  Pt also with hyponatremia and acute renal failure. Ct modified to no contrast.   Pt also seen by Dr Sabra Heck.  Pt with acute abd/bowel perforation.  Dr. Arnoldo Morale evaluated pt's Ct and pmh, recommends she may be more appropriate pt for [facility] given the degree of complexity of her case, pmh and may need colectomy/ostomy surgery during this admission. No cardiology back up here which she may require during her stay.  Pt has been seen by Dr Excell Seltzer in the past for her breast ca.  [facility] paged.  Also call to hospitalists to arrange for admission.  Call from Dr. Clementeen Graham who will see and admit pt, would like surgical input prior to assigning bed, assuming pt to be transferred.   1:37 PM Delay in response from [facility].  Spoke with Dr. Donne Hazel who is not comfortable accepting this surgical pt until Dr. Arnoldo Morale has evaluated pt.  Call to Dr. Arnoldo Morale, should arrive within the next 15 minutes to consult.  Notified Dr. Clementeen Graham of plan.  Final Clinical Impressions(s) / ED Diagnoses   Final diagnoses:  Perforated bowel ([place])  Diverticulitis  AKI (acute kidney injury) [facility])  Hyponatremia    ED Discharge Orders    None       Evalee Jefferson, Hershal Coria 07/24/18  [place], Tysheena [place], Hershal Coria 07/24/18 1408    Noemi Chapel, MD 07/24/18 2010

## 2018-07-24 NOTE — ED Provider Notes (Signed)
Medical screening examination/treatment/procedure(s) were conducted as a shared visit with non-physician practitioner(s) and myself.  I personally evaluated the patient during the encounter.  The patient presents with severe abdominal pain which happened overnight, she was recently initiated on treatment for presumed diverticulitis with Cipro and Flagyl however pain is become worsened.  She also reports that she has had some distention of her abdomen, she is nauseated but no vomiting.  No stools in approximately 1 week.  On my exam she has a peritoneal abdomen diffusely tender and guarding with even changes in position.  She is not tachycardic but has some dry mucous membranes and appears very uncomfortable and pale.  She will need an upright x-ray, labs and potentially a CT scan because she has had a perforation.  X-rays reveal no signs of free air however a CT scan does show that she has had a perforation of her anterior sigmoid colon secondary to either bad diverticulitis or STIR colitis secondary to the stool in the bowel.  There is a contained perforation of approximately just under 7 cm in size containing stool and air.  Her labs are significantly abnormal with a hyponatremia, acute kidney injury with a creatinine of 2, sodium of 124, bilirubin of 1.8.  I have paged the general surgeon at 11:35 AM, will discuss for possible surgery versus conservative therapy with antibiotics and percutaneous drainage.  Patient is critically ill  After Arnoldo Morale with the general surgery service was consulted and request the patient be transferred to a higher level of care  Dr. Donne Hazel at [facility] made aware of the patient, Dr. Arnoldo Morale has seen the patient in person and left consultation recommendations.  The patient will likely need a surgical intervention given the perforated sigmoid diverticula with air and stool and a small area of free air.  .Critical Care Performed by: Noemi Chapel,  MD Authorized by: Noemi Chapel, MD   Critical care provider statement:    Critical care time (minutes):  35   Critical care time was exclusive of:  Separately billable procedures and treating other patients and teaching time   Critical care was necessary to treat or prevent imminent or life-threatening deterioration of the following conditions: perforation of abdominal viscous.   Critical care was time spent personally by me on the following activities:  Blood draw for specimens, development of treatment plan with patient or surrogate, discussions with consultants, evaluation of patient's response to treatment, examination of patient, obtaining history from patient or surrogate, ordering and performing treatments and interventions, ordering and review of laboratory studies, ordering and review of radiographic studies, pulse oximetry, re-evaluation of patient's condition and review of old charts   Final diagnoses:  Perforated bowel ([place])  Diverticulitis  AKI (acute kidney injury) ([place])  Hyponatremia      Noemi Chapel, MD 07/28/18 1450

## 2018-07-24 NOTE — H&P (Addendum)
TRH H&P   Patient Demographics:    Melody Casey, is a 72 y.o. female  MRN: [id number]   DOB - 28-Feb-1946  Admit Date - 07/24/2018  Outpatient Primary MD for the patient is Dettinger, [name], MD  Referring MD: Dr Sabra Heck  Outpatient Specialists:    Dr Jana Hakim  Patient coming from: home  Chief Complaint  Patient presents with  . Abdominal Pain    diverticulitis flare      HPI:    Melody Casey  is a 72 y.o. female, with multiple comorbidities including recurrent diverticulitis of colon, CAD with history of NSTEMI status post DES to OM 2 in 2012, A. fib on Coumadin and Tikosyn, history of breast cancer, infrarenal AAA, chronic kidney stones with partial obstruction, osteoarthritis of the knee, hypertension and hyperlipidemia who was seen in her PCP office 2 days back for left lower quadrant abdominal pain for past 5 days.  She reports being constipated for the same duration.  Her PCP found her to be tender in the left lower quadrant and started her on Cipro and Flagyl with suspicion for acute mild diverticulitis.  Patient reports that she started having worsened abdominal pain with distention, now radiating to periumbilical area associated with dry heaving but no vomiting.  Pain is dull in nature about 7/10 in severity involving left lower quadrant and periumbilical area, worsened on movement.  she denies any fevers but reports having sweats.  Her last bowel movement was almost 1 week back.  She has been having very poor appetite for almost a week.  Patient denies any headache, dizziness, blurred vision, chest pain, shortness of breath, dysuria, blood per rectum, leg swellings or weakness. She denies any recent travel or sick contact.  Denies eating anything outside recently.  Course in the ED To be septic with a heart rate in 110s, tachypneic, hypotensive with blood pressure of 82/47  mmHg and O2 sat of 86% on room air improved to 97% on 2 L. Blood work showed normal WBC, hyponatremia with sodium of 124, chloride of 94, CO2 of 16 with anion gap of 14.  She also had acute kidney injury with BUN of 58 and creatinine of 2.18 and elevated lactic acid of 2.4.  Her INR was supratherapeutic at 3.01.  BNP of 560.  Chest x-ray unremarkable.  EKG showed sinus tachycardia at 109 with poor R wave progression and diffuse ST depression.  A CT of the abdomen and pelvis without contrast was done showing large amount of stool within the sigmoid colon with moderate thickening and surrounding inflammatory changes with focal perforation of the anterior sigmoid wall with 2 x 6.8 x 5.4 cm gas and stool containing collection.  Additional small foci of pneumoperitoneum and free fluid near the site of perforation and within the mesentery also noted. See detailed results below. Sepsis pathway initiated in the ED and patient  ordered for 3 L IV normal saline bolus, empiric IV Rocephin and Flagyl along with Zofran. General surgery was consulted who recommended given her extensive comorbidities and cardiac history she would benefit from being evaluated at [facility] and that she may not need an urgent surgery. Hospitalist consulted for admission and general surgery at [facility] consulted by ED physician.  Spoke with Dr. Donne Hazel who recommended INR reversal with Kcentra and vitamin K and will see patient in consultation.     Review of systems:    In addition to the HPI above,  No Fever-chills, reports sweating No Headache, No changes with Vision or hearing, No problems swallowing food or Liquids, No Chest pain, Cough or Shortness of Breath, Lower abdominal pain+++, nausea++, constipation+++, no vomiting No Blood in stool or Urine, No dysuria, No new skin rashes or bruises, No new joints pains-aches,  No new weakness, tingling, numbness in any extremity, No recent weight gain or loss, No polyuria,  polydypsia or polyphagia, No significant Mental Stressors.    With Past History of the following :    Past Medical History:  Diagnosis Date  . AAA (abdominal aortic aneurysm) ([place])   . Acute diverticulitis   . Anxiety   . Arthritis   . Atrial fibrillation ([place])    a. Dx 03/2011 - on tikosyn/coumadin.  Marland Kitchen CAD (coronary artery disease)    a. NSTEMI 02/2011: occ mid Cx, DES to OM2, residual nonobst LAD dz.  . Cancer ([place])    breast  . Diabetes mellitus   . Diverticulitis   . Diverticulitis of colon     2008, 04/2011, 12/2014, 08/2015  . 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 Hemangioma    liver  . HLD (hyperlipidemia)   . HTN (hypertension)   . Osteoporosis   . Tobacco abuse    stopped smoking 2012  . Ureteral obstruction    History of gross hematuria/right hydronephrosis 2/2 to uteropelvic junction obstruction, s/p cystoscopy in January 2007 with bilateral retrograde pyelography, right ureter arthroscopy, right ureteral stent placement, bladder biopsies, stent removal since then.  . Wears dentures    top      Past Surgical History:  Procedure Laterality Date  . BIOPSY N/A 11/01/2015   Procedure: BIOPSY;  Surgeon: Danie Binder, MD;  Location: [facility];  Service: Endoscopy;  Laterality: N/A;  . BREAST LUMPECTOMY WITH NEEDLE LOCALIZATION AND AXILLARY SENTINEL LYMPH NODE BX Right 03/01/2014   Procedure: BREAST LUMPECTOMY WITH NEEDLE LOCALIZATION AND AXILLARY SENTINEL LYMPH NODE BX;  Surgeon: Edward Jolly, MD;  Location: [place];  Service: General;  Laterality: Right;  . BREAST SURGERY    . CARDIAC CATHETERIZATION  2012   stent  . COLONOSCOPY WITH PROPOFOL N/A 06/29/2014   Dr. Wynetta Emery: universal diverticulosis  . CORONARY STENT PLACEMENT  2012  . ESOPHAGOGASTRODUODENOSCOPY (EGD) WITH PROPOFOL N/A 11/01/2015   Procedure: ESOPHAGOGASTRODUODENOSCOPY (EGD) WITH PROPOFOL;  Surgeon: Danie Binder, MD;  Location: [facility];  Service: Endoscopy;  Laterality:  N/A;  . KNEE ARTHROSCOPY     left  . Right leg surgery  age 8   As a child for  ?scleroderma per patient  . TONSILLECTOMY AND ADENOIDECTOMY    . TUBAL LIGATION    . Ureteral surgery  2011   rt ureterostomy-stent      Social History:     Social History   Tobacco Use  . Smoking status: Former Smoker    Packs/day: 2.00    Years:  50.00    Pack years: 100.00    Types: Cigarettes    Last attempt to quit: 02/08/2011    Years since quitting: 7.4  . Smokeless tobacco: Never Used  Substance Use Topics  . Alcohol use: No    Alcohol/week: 0.0 standard drinks     Lives - home with husband  Mobility -independent     Family History :     Family History  Problem Relation Age of Onset  . Lung cancer Father 67       deceased  . Cancer Father   . Heart failure Mother 75       deceased  . Heart disease Mother   . Diabetes Brother   . Hypertension Brother   . Cancer Brother        prostate  . Colon cancer Neg Hx   . Liver disease Neg Hx       Home Medications:   Prior to Admission medications   Medication Sig Start Date End Date Taking? Authorizing Provider  ALPRAZolam (XANAX) 0.25 MG tablet Take 1 tablet (0.25 mg total) by mouth daily as needed for anxiety. 04/11/17  Yes Dettinger, [name], MD  amLODipine (NORVASC) 2.5 MG tablet Take 1 tablet (2.5 mg total) by mouth daily. Please call to make appointment for further refills. 05/06/18  Yes Minus Breeding, MD  amoxicillin (AMOXIL) 500 MG capsule Take 1 capsule (500 mg total) by mouth 3 (three) times daily. Patient not taking: Reported on 07/24/2018 06/30/18   Claretta Fraise, MD  anastrozole (ARIMIDEX) 1 MG tablet Take 1 tablet (1 mg total) by mouth daily. 06/11/18   Magrinat, Virgie Dad, MD  atorvastatin (LIPITOR) 40 MG tablet Take 1 1/2 tablets daily 07/16/18   Minus Breeding, MD  calcium-vitamin D (OSCAL WITH D) 500-200 MG-UNIT per tablet Take 1 tablet by mouth daily with breakfast.     [provider]  ciprofloxacin  (CIPRO) 500 MG tablet Take 1 tablet (500 mg total) by mouth 2 (two) times daily for 10 days. 07/22/18 08/01/18  Claretta Fraise, MD  dofetilide (TIKOSYN) 250 MCG capsule Take 1 capsule (250 mcg total) by mouth 2 (two) times daily. 01/14/18   Minus Breeding, MD  hydroxychloroquine (PLAQUENIL) 200 MG tablet Take by mouth 2 (two) times daily.    [provider]  KLOR-CON M20 20 MEQ tablet TAKE 1 TABLET BY MOUTH TWICE DAILY 02/24/18   Troy Sine, MD  linaclotide [facility]) 145 MCG CAPS capsule Take 1 capsule (145 mcg total) by mouth daily before breakfast. 07/22/18   Claretta Fraise, MD  lisinopril (PRINIVIL,ZESTRIL) 40 MG tablet TAKE 1 TABLET BY MOUTH IN THE MORNING 05/06/18   Timmothy Euler, MD  magnesium oxide (MAG-OX) 400 MG tablet Take 1 tablet (400 mg total) by mouth daily. Take 400 mg twice a day with food Patient taking differently: Take 400 mg by mouth daily.  03/26/17   Cherre Robins, PharmD  metFORMIN (GLUCOPHAGE) 500 MG tablet TAKE 1 TABLET BY MOUTH ONCE DAILY 05/12/18   Timmothy Euler, MD  metroNIDAZOLE (FLAGYL) 500 MG tablet Take 1 tablet (500 mg total) by mouth 2 (two) times daily for 10 days. 07/22/18 08/01/18  Claretta Fraise, MD  nitroGLYCERIN (NITROSTAT) 0.4 MG SL tablet Place 1 tablet (0.4 mg total) under the tongue every 5 (five) minutes as needed for chest pain. 06/30/15   Minus Breeding, MD  predniSONE (DELTASONE) 5 MG tablet TAKE 1 TO 2 TABLETS BY MOUTH ONCE DAILY 06/26/18   [provider]  warfarin (COUMADIN) 5 MG tablet Take 1 tablet daily except on Thursdays take 1 and 1/2 tablets. Patient taking differently: Take 5 mg by mouth daily.  10/01/16   Cherre Robins, PharmD     Allergies:     Allergies  Allergen Reactions  . Miralax [Polyethylene Glycol] Swelling and Rash    Took [facility] brand developed rash. Patient states she tolerated name brand MiraLax in the past.  09/01/15. Patient states she had diffuse swelling including swelling of her lips.   .  Vancomycin Rash and Shortness Of Breath  . Acetaminophen Hives  . Oxycodone-Acetaminophen Itching  . Sulfa Antibiotics Rash    All over rash  . Sulfacetamide Sodium Rash    All over rash  . Sulfasalazine Rash    All over rash All over rash  . Banana Other (See Comments)    Unknown  . Latex Rash  . Tape Rash     Physical Exam:   Vitals  Blood pressure (!) 103/47, pulse (!) 110, temperature 97.9 F (36.6 C), temperature source Oral, resp. rate 16, height 5\' 8"  (1.727 m), weight 72.6 kg, SpO2 93 %.   General: Elderly female appears fatigued, not in acute distress HEENT: Pupils reactive bilaterally, EOMI, no pallor, dry oral mucosa, supple neck, no JVD Chest: Clear to auscultation bilaterally, no added sounds CVs: S1-S2 tachycardic, no murmurs rub or gallop GI: Moderately distended, bowel sounds sluggish, tender to pressure over the left lower quadrant and periumbilical area. Musculoskeletal: Warm, no edema, chronic deformity of the right foot with cool lower extremity but has distal pulses CNS: Alert and oriented x3, nonfocal     Data Review:    CBC Recent Labs  Lab 07/22/18 1632 07/24/18 0944  WBC 15.1* 5.2  HGB 13.3 12.8  HCT 40.4 37.0  PLT 204 193  MCV 87 82.4  MCH 28.5 28.5  MCHC 32.9 34.6  RDW 17.4* 17.1*  LYMPHSABS 1.4 0.4*  MONOABS  --  0.6  EOSABS 0.0 0.0  BASOSABS 0.0 0.0   ------------------------------------------------------------------------------------------------------------------  Chemistries  Recent Labs  Lab 07/22/18 1632 07/24/18 0944  NA 132* 124*  K 4.6 4.6  CL 93* 94*  CO2 18* 16*  GLUCOSE 134* 152*  BUN 32* 58*  CREATININE 1.43* 2.18*  CALCIUM 10.4* 9.5  AST 14 20  ALT 10 15  ALKPHOS 110 120  BILITOT 0.9 1.8*   ------------------------------------------------------------------------------------------------------------------ estimated creatinine clearance is 22.8 mL/min (A) (by C-G formula based on SCr of 2.18 mg/dL  (H)). ------------------------------------------------------------------------------------------------------------------ No results for input(s): TSH, T4TOTAL, T3FREE, THYROIDAB in the last 72 hours.  Invalid input(s): FREET3  Coagulation profile Recent Labs  Lab 07/24/18 0944  INR 3.01   ------------------------------------------------------------------------------------------------------------------- No results for input(s): DDIMER in the last 72 hours. -------------------------------------------------------------------------------------------------------------------  Cardiac Enzymes No results for input(s): CKMB, TROPONINI, MYOGLOBIN in the last 168 hours.  Invalid input(s): CK ------------------------------------------------------------------------------------------------------------------    Component Value Date/Time   BNP 562.7 (H) 08/13/2015 1155     ---------------------------------------------------------------------------------------------------------------  Urinalysis    Component Value Date/Time   COLORURINE YELLOW 09/01/2015 1800   APPEARANCEUR Cloudy (A) 06/30/2018 1345   LABSPEC 1.025 09/01/2015 1800   PHURINE 6.0 09/01/2015 1800   GLUCOSEU Negative 06/30/2018 1345   GLUCOSEU NEGATIVE 04/05/2008 0833   HGBUR SMALL (A) 09/01/2015 1800   BILIRUBINUR Negative 06/30/2018 1345   KETONESUR NEGATIVE 09/01/2015 1800   PROTEINUR Negative 06/30/2018 1345   PROTEINUR TRACE (A) 09/01/2015 1800   UROBILINOGEN negative 10/25/2015 0926   UROBILINOGEN 0.2 09/01/2015  1800   NITRITE Positive (A) 06/30/2018 1345   NITRITE NEGATIVE 09/01/2015 1800   LEUKOCYTESUR 1+ (A) 06/30/2018 1345    ----------------------------------------------------------------------------------------------------------------   Imaging Results:    Ct Abdomen Pelvis Wo Contrast  Result Date: 07/24/2018 CLINICAL DATA:  Abdominal pain.  History of diverticulitis. EXAM: CT ABDOMEN AND PELVIS  WITHOUT CONTRAST TECHNIQUE: Multidetector CT imaging of the abdomen and pelvis was performed following the standard protocol without IV contrast. COMPARISON:  CT abdomen pelvis dated Apr 10, 2016. FINDINGS: Lower chest: No acute abnormality. New 9 x 5 mm pulmonary nodule in the left lower lobe (series 4, image 13). Hepatobiliary: Hepatic steatosis. No focal liver abnormality. Small layering gallstones. No gallbladder wall thickening or biliary dilatation. Pancreas: Unremarkable. No pancreatic ductal dilatation or surrounding inflammatory changes. Spleen: Normal in size without focal abnormality. Adrenals/Urinary Tract: The adrenal glands are unremarkable. Unchanged bilateral renal simple cysts. Bilateral hilar renal vascular calcifications. Punctate nonobstructive calculi in both kidneys. Unchanged chronic mild right hydronephrosis with decompressed right ureter. The bladder is unremarkable. Stomach/Bowel: Moderate colonic diverticulosis. Large amount of stool within the sigmoid colon with moderate wall thickening and prominent surrounding inflammatory changes. Focal disruption of the anterior sigmoid colonic wall (series 2, image 62 with adjacent 2.0 x 6.8 x 5.4 cm gas and stool containing collection. The colonic wall defect measures approximately 1.6 cm. The stomach and small bowel are unremarkable.  No obstruction. Vascular/Lymphatic: Interval enlargement of the infrarenal abdominal aortic aneurysm, now measuring 3.8 cm, previously 3.3 cm. Aortoiliac atherosclerosis. Multiple reactive mesenteric lymph nodes in the pelvis. No enlarged abdominal or pelvic lymph nodes. Reproductive: Multiple calcified uterine fibroids.  No adnexal mass. Other: Small amount of free fluid in the mesentery and pelvis. Multiple small foci of pneumoperitoneum near the site of sigmoid colonic perforation and in the mesentery. Musculoskeletal: No acute or significant osseous findings. IMPRESSION: 1. Large amount of stool within the sigmoid  colon with moderate wall thickening and prominent surrounding inflammatory changes with focal perforation of the anterior sigmoid colonic wall with adjacent 2.0 x 6.8 x 5.4 cm gas and stool containing collection. Additional small foci of pneumoperitoneum and free fluid near the site of perforation and within the mesentery. 2. Etiology of underlying sigmoid colonic inflammation could be related to stercoral colitis or diverticulitis. 3. New 7 mm aggregate pulmonary nodule in the left lower lobe. Non-contrast chest CT at 6-12 months is recommended. If the nodule is stable at time of repeat CT, then future CT at 18-24 months (from today's scan) is considered optional for low-risk patients, but is recommended for high-risk patients. This recommendation follows the consensus statement: Guidelines for Management of Incidental Pulmonary Nodules Detected on CT Images: From the Fleischner Society 2017; Radiology 2017; 284:228-243. 4. Enlargement of the infrarenal abdominal aortic aneurysm, now measuring 3.8 cm. Recommend followup by ultrasound in 2 years. This recommendation follows ACR consensus guidelines: White Paper of the ACR Incidental Findings Committee II on Vascular Findings. J Am Coll Radiol 2013; 10:789-794. 5.  Aortic atherosclerosis (ICD10-I70.0). 6. Hepatic steatosis. 7. Cholelithiasis. 8. Bilateral nonobstructive nephrolithiasis. 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 Critical Value/emergent results were called by telephone at the time of interpretation on 07/24/2018 at 11:36 am to Dr. Noemi Chapel, who verbally acknowledged these results. Electronically Signed   By: Titus Dubin M.D.   On: 07/24/2018 11:44   Dg Chest Portable 1 View  Result Date: 07/24/2018 CLINICAL DATA:  History of breast carcinoma.  Upper abdominal pain EXAM: PORTABLE CHEST 1  VIEW COMPARISON:  June 30, 2015 FINDINGS: Upright image obtained. Lungs are clear. Heart is upper normal in size with  pulmonary vascularity normal. No adenopathy. There is aortic atherosclerosis. No evident bone lesions. No pneumoperitoneum is demonstrated on this study. IMPRESSION: No edema or consolidation. No demonstrable pneumoperitoneum. There is aortic atherosclerosis. Aortic Atherosclerosis (ICD10-I70.0). Electronically Signed   By: Lowella Grip III M.D.   On: 07/24/2018 09:57    My personal review of EKG: Sinus tachycardia at 109, poor R wave progression with ST depression in inferior and anterolateral leads.  Assessment & Plan:    Principal Problem:   Sepsis ([place]) Secondary to sigmoid diverticulitis with anterior wall perforation.  Sepsis pathway initiated in the ED in order for 3 L normal saline bolus along with empiric IV Rocephin and Flagyl.  I will switch antibiotics to IV Zosyn.  Keep n.p.o.  Pain control with low-dose IV morphine PRN along with IV Zofran for nausea. Blood pressure improved with normal saline bolus.  Will place on maintenance normal saline at 125 cc/h after third liter bolus.  Follow lactic acid and check procalcitonin. Admit to stepdown unit [facility] at [facility].  Active Problems: Sigmoid diverticulitis with anterior wall perforation History of recurrent diverticulitis.  Was placed on Cipro and Flagyl as outpatient 2 days back without improvement.  Received IV Rocephin and Flagyl in the ED, switch to IV Zosyn.  Follow blood culture.  surgery Dr. Arnoldo Morale who recommended given her extensive comorbidity she would benefit from being evaluated by surgery at [facility] and also have cardiology clearance. Discussed with [facility] surgery Dr. Donne Hazel and patient will be transferred to [facility] under hospitalist service.  Acute kidney injury ([place]) ATN likely secondary to bowel perforation and dehydration.  Discontinue ACE inhibitor and avoid nephrotoxic agents.  Monitor with IV fluids.  Strict I's/O.  Hyponatremia Suspect prerenal secondary to dehydration and sepsis.  Check UA and  urine sodium.  Monitor with IV fluids.    Type 2 diabetes mellitus with complication, without long-term current use of insulin (HCC)  Hold metformin.  Monitor on sliding scale coverage every 4 hours.  Paroxysmal atrial fibrillation. Has sinus tachycardia.  INR of 3.01.  Warfarin held and ordered Kcentra along with vitamin K for reversal for surgery..  Monitor INR.     CAD (coronary artery disease) History of NSTEMI status post DES to OM 2.  Hold home medications.    ST depression on EKG. No chest pain symptoms possibly has demand ischemia.  Has extensive cardiac history including CAD and A. fib along with infrarenal AAA.  Cycle troponins.  Needs cardiology clearance for surgery. Patient not on beta-blocker at home.  2D echo from 2016 with normal EF and no wall motion abnormality.     AAA (abdominal aortic aneurysm) without rupture (HCC) CT abdomen noted for increasing size of AAA (3.8 cm).  Radiologist recommend follow-up ultrasound in 2 years.  Pulmonary nodule Incidental findings of 7 mm nodule in the left lower lobe.  Given history of smoking and breast cancer will need follow-up CT chest in 6-12 months.  ER positive right breast cancer Status post lumpectomy with sentinel lymph node biopsy in 2015.  Follow with Dr. Jana Hakim.  On Arimidex which is held.  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  DVT Prophylaxis: SCDs, supratherapeutic INR.  AM Labs Ordered, also please review Full Orders  Family Communication: Admission, patients condition and plan of care including tests being ordered have been discussed with the  patient at bedside.   Code Status full code  Likely DC to home pending hospital course  Condition GUARDED    Consults called: Dr. Arnoldo Morale, [place] surgery  Admission status: Inpatient  Time spent in minutes : 70   Vester Titsworth M.D on 07/24/2018 at 1:19 PM  Between 7am to 7pm - Pager -  [phone]. After 7pm go to [email or web address] - password [email or web address]  [facility] - Office  [phone]

## 2018-07-24 NOTE — ED Triage Notes (Signed)
EMS brings from home, Seen at PCP yesterday for diverticulosis, Not eating or drinking due to pain, pt has dry heaves.  Started on cipro and Flagy yesterday.

## 2018-07-24 NOTE — Progress Notes (Signed)
Patient arrived at [facility] cone stepdown unit. Spoke with surgery Dr Georgette Dover who's on call and will see her.

## 2018-07-24 NOTE — Progress Notes (Signed)
Patient arrived on the unit via carelink, assessment completed see flowsheet, Nishant MD notified, placed on tele ccmd notified , patient oriented to room and staff, bed in lowest position call bell within reach will monitor.

## 2018-07-24 NOTE — Consult Note (Signed)
Reason for Consult:Diverticulitis with perforation Referring Physician: Dhungel  Melody Casey is an 72 y.o. female.  HPI: This is a 72 year old female with multiple medical issues including frequent recurrent sigmoid diverticulitis as well as breast cancer who presents with a day history of worsening left lower quadrant abdominal pain.  She has been on antibiotics.  The pain continued to worsen.  She presented to the emergency department at [facility] earlier today for evaluation.  A CT scan showed extensive sigmoid diverticulitis with evidence of perforation containing air and stool.  She has a small focus of pneumoperitoneum.  The patient is anticoagulated on Coumadin and her INR is 3.  Her vital signs have remained stable.  She does see cardiology on a regular basis.  Because of her medical issues, she has been transferred to [facility] for further evaluation.  Past Medical History:  Diagnosis Date  . AAA (abdominal aortic aneurysm) ([place])   . Acute diverticulitis   . Anxiety   . Arthritis   . Atrial fibrillation ([place])    a. Dx 03/2011 - on tikosyn/coumadin.  Marland Kitchen CAD (coronary artery disease)    a. NSTEMI 02/2011: occ mid Cx, DES to OM2, residual nonobst LAD dz.  . Cancer ([place])    breast  . Diabetes mellitus   . Diverticulitis   . Diverticulitis of colon     2008, 04/2011, 12/2014, 08/2015  . 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 Hemangioma    liver  . HLD (hyperlipidemia)   . HTN (hypertension)   . Osteoporosis   . Tobacco abuse    stopped smoking 2012  . Ureteral obstruction    History of gross hematuria/right hydronephrosis 2/2 to uteropelvic junction obstruction, s/p cystoscopy in January 2007 with bilateral retrograde pyelography, right ureter arthroscopy, right ureteral stent placement, bladder biopsies, stent removal since then.  . Wears dentures    top    Past Surgical History:  Procedure Laterality Date  . BIOPSY N/A 11/01/2015   Procedure: BIOPSY;   Surgeon: Danie Binder, MD;  Location: [facility];  Service: Endoscopy;  Laterality: N/A;  . BREAST LUMPECTOMY WITH NEEDLE LOCALIZATION AND AXILLARY SENTINEL LYMPH NODE BX Right 03/01/2014   Procedure: BREAST LUMPECTOMY WITH NEEDLE LOCALIZATION AND AXILLARY SENTINEL LYMPH NODE BX;  Surgeon: Edward Jolly, MD;  Location: [place];  Service: General;  Laterality: Right;  . BREAST SURGERY    . CARDIAC CATHETERIZATION  2012   stent  . COLONOSCOPY WITH PROPOFOL N/A 06/29/2014   Dr. Wynetta Emery: universal diverticulosis  . CORONARY STENT PLACEMENT  2012  . ESOPHAGOGASTRODUODENOSCOPY (EGD) WITH PROPOFOL N/A 11/01/2015   Procedure: ESOPHAGOGASTRODUODENOSCOPY (EGD) WITH PROPOFOL;  Surgeon: Danie Binder, MD;  Location: [facility];  Service: Endoscopy;  Laterality: N/A;  . KNEE ARTHROSCOPY     left  . Right leg surgery  age 28   As a child for  ?scleroderma per patient  . TONSILLECTOMY AND ADENOIDECTOMY    . TUBAL LIGATION    . Ureteral surgery  2011   rt ureterostomy-stent    Family History  Problem Relation Age of Onset  . Lung cancer Father 76       deceased  . Cancer Father   . Heart failure Mother 47       deceased  . Heart disease Mother   . Diabetes Brother   . Hypertension Brother   . Cancer Brother        prostate  . Colon cancer  Neg Hx   . Liver disease Neg Hx     Social History:  reports that she quit smoking about 7 years ago. Her smoking use included cigarettes. She has a 100.00 pack-year smoking history. She has never used smokeless tobacco. She reports that she does not drink alcohol or use drugs.  Allergies:  Allergies  Allergen Reactions  . Miralax [Polyethylene Glycol] Swelling and Rash    Took [facility] brand developed rash. Patient states she tolerated name brand MiraLax in the past.  09/01/15. Patient states she had diffuse swelling including swelling of her lips.   . Vancomycin Rash and Shortness Of Breath  . Acetaminophen Hives  .  Oxycodone-Acetaminophen Itching  . Sulfa Antibiotics Rash    All over rash  . Sulfacetamide Sodium Rash    All over rash  . Sulfasalazine Rash    All over rash All over rash  . Banana Other (See Comments)    Unknown  . Latex Rash  . Tape Rash    Medications:  Prior to Admission medications   Medication Sig Start Date End Date Taking? Authorizing Provider  ALPRAZolam (XANAX) 0.25 MG tablet Take 1 tablet (0.25 mg total) by mouth daily as needed for anxiety. 04/11/17  Yes Dettinger, [name], MD  amLODipine (NORVASC) 2.5 MG tablet Take 1 tablet (2.5 mg total) by mouth daily. Please call to make appointment for further refills. 05/06/18  Yes Minus Breeding, MD  anastrozole (ARIMIDEX) 1 MG tablet Take 1 tablet (1 mg total) by mouth daily. 06/11/18  Yes Magrinat, Virgie Dad, MD  atorvastatin (LIPITOR) 40 MG tablet Take 1 1/2 tablets daily 07/16/18  Yes Minus Breeding, MD  ciprofloxacin (CIPRO) 500 MG tablet Take 1 tablet (500 mg total) by mouth 2 (two) times daily for 10 days. 07/22/18 08/01/18 Yes Stacks, Cletus Gash, MD  dofetilide (TIKOSYN) 250 MCG capsule Take 1 capsule (250 mcg total) by mouth 2 (two) times daily. 01/14/18  Yes Minus Breeding, MD  hydroxychloroquine (PLAQUENIL) 200 MG tablet Take by mouth 2 (two) times daily.   Yes [provider]  KLOR-CON M20 20 MEQ tablet TAKE 1 TABLET BY MOUTH TWICE DAILY Patient taking differently: Take 20 mEq by mouth daily.  02/24/18  Yes Troy Sine, MD  linaclotide Rolan Lipa) 145 MCG CAPS capsule Take 1 capsule (145 mcg total) by mouth daily before breakfast. 07/22/18  Yes Stacks, Cletus Gash, MD  lisinopril (PRINIVIL,ZESTRIL) 40 MG tablet TAKE 1 TABLET BY MOUTH IN THE MORNING 05/06/18  Yes Timmothy Euler, MD  magnesium oxide (MAG-OX) 400 MG tablet Take 1 tablet (400 mg total) by mouth daily. Take 400 mg twice a day with food Patient taking differently: Take 400 mg by mouth daily.  03/26/17  Yes Cherre Robins, PharmD  metFORMIN (GLUCOPHAGE) 500 MG  tablet TAKE 1 TABLET BY MOUTH ONCE DAILY 05/12/18  Yes Timmothy Euler, MD  metroNIDAZOLE (FLAGYL) 500 MG tablet Take 1 tablet (500 mg total) by mouth 2 (two) times daily for 10 days. 07/22/18 08/01/18 Yes Stacks, Cletus Gash, MD  nitroGLYCERIN (NITROSTAT) 0.4 MG SL tablet Place 1 tablet (0.4 mg total) under the tongue every 5 (five) minutes as needed for chest pain. 06/30/15  Yes Minus Breeding, MD  warfarin (COUMADIN) 5 MG tablet Take 1 tablet daily except on Thursdays take 1 and 1/2 tablets. Patient taking differently: Take 5 mg by mouth daily.  10/01/16  Yes Cherre Robins, PharmD  amoxicillin (AMOXIL) 500 MG capsule Take 1 capsule (500 mg total) by mouth 3 (three) times  daily. Patient not taking: Reported on 07/24/2018 06/30/18   Claretta Fraise, MD     Results for orders placed or performed during the hospital encounter of 07/24/18 (from the past 48 hour(s))  Comprehensive metabolic panel     Status: Abnormal   Collection Time: 07/24/18  9:44 AM  Result Value Ref Range   Sodium 124 (L) 135 - 145 mmol/L   Potassium 4.6 3.5 - 5.1 mmol/L   Chloride 94 (L) 98 - 111 mmol/L   CO2 16 (L) 22 - 32 mmol/L   Glucose, Bld 152 (H) 70 - 99 mg/dL   BUN 58 (H) 8 - 23 mg/dL   Creatinine, Ser 2.18 (H) 0.44 - 1.00 mg/dL   Calcium 9.5 8.9 - 10.3 mg/dL   Total Protein 6.8 6.5 - 8.1 g/dL   Albumin 2.7 (L) 3.5 - 5.0 g/dL   AST 20 15 - 41 U/L   ALT 15 0 - 44 U/L   Alkaline Phosphatase 120 38 - 126 U/L   Total Bilirubin 1.8 (H) 0.3 - 1.2 mg/dL   GFR calc non Af Amer 21 (L) >60 mL/min   GFR calc Af Amer 24 (L) >60 mL/min    Comment: (NOTE) The eGFR has been calculated using the CKD EPI equation. This calculation has not been validated in all clinical situations. eGFR's persistently <60 mL/min signify possible Chronic Kidney Disease.    Anion gap 14 5 - 15    Comment: Performed at [facility], [address], [place], [place] [address]  Lipase, blood     Status: None   Collection Time: 07/24/18  9:44 AM   Result Value Ref Range   Lipase 24 11 - 51 U/L    Comment: Performed at [facility], [address] [place], [place] [address]  CBC with Diff     Status: Abnormal   Collection Time: 07/24/18  9:44 AM  Result Value Ref Range   WBC 5.2 4.0 - 10.5 K/uL   RBC 4.49 3.87 - 5.11 MIL/uL   Hemoglobin 12.8 12.0 - 15.0 g/dL   HCT 37.0 36.0 - 46.0 %   MCV 82.4 78.0 - 100.0 fL   MCH 28.5 26.0 - 34.0 pg   MCHC 34.6 30.0 - 36.0 g/dL   RDW 17.1 (H) 11.5 - 15.5 %   Platelets 193 150 - 400 K/uL   Neutrophils Relative % 81 %   Lymphocytes Relative 7 %   Monocytes Relative 12 %   Eosinophils Relative 0 %   Basophils Relative 0 %   Neutro Abs 4.2 1.7 - 7.7 K/uL   Lymphs Abs 0.4 (L) 0.7 - 4.0 K/uL   Monocytes Absolute 0.6 0.1 - 1.0 K/uL   Eosinophils Absolute 0.0 0.0 - 0.7 K/uL   Basophils Absolute 0.0 0.0 - 0.1 K/uL   WBC Morphology MILD LEFT SHIFT (1-5% METAS, OCC MYELO, OCC BANDS)     Comment: Performed at [facility], [address] [place], [place] [address]  Lactic acid, plasma     Status: Abnormal   Collection Time: 07/24/18  9:44 AM  Result Value Ref Range   Lactic Acid, Venous 2.4 (HH) 0.5 - 1.9 mmol/L    Comment: CRITICAL RESULT CALLED TO, READ BACK BY AND VERIFIED WITH: MINTER B. AT 1030A ON [id number] BY THOMPSON S. Performed at [facility], [address], [place], [place] [address]   Protime-INR     Status: Abnormal   Collection Time: 07/24/18  9:44 AM  Result Value Ref Range  Prothrombin Time 31.0 (H) 11.4 - 15.2 seconds   INR 3.01     Comment: Performed at [facility], [address], [place], [place] [address]  Blood Culture (routine x 2)     Status: None (Preliminary result)   Collection Time: 07/24/18 11:07 AM  Result Value Ref Range   Specimen Description BLOOD LEFT ARM    Special Requests      BOTTLES DRAWN AEROBIC AND ANAEROBIC Blood Culture adequate volume Performed at [facility], [address], [place], [place] [address]    Culture PENDING    Report Status  PENDING   Blood Culture (routine x 2)     Status: None (Preliminary result)   Collection Time: 07/24/18 11:07 AM  Result Value Ref Range   Specimen Description BLOOD LEFT HAND    Special Requests      BOTTLES DRAWN AEROBIC AND ANAEROBIC Blood Culture adequate volume Performed at [facility], [address] [place], [place] [address]    Culture PENDING    Report Status PENDING   Lactic acid, plasma     Status: Abnormal   Collection Time: 07/24/18  1:13 PM  Result Value Ref Range   Lactic Acid, Venous 2.2 (HH) 0.5 - 1.9 mmol/L    Comment: CRITICAL RESULT CALLED TO, READ BACK BY AND VERIFIED WITH: MOSER J. AT 1402 ON [id number] BY THOMPSON S. Performed at [facility], [address], [place], [place] [address]   Glucose, capillary     Status: Abnormal   Collection Time: 07/24/18  6:04 PM  Result Value Ref Range   Glucose-Capillary 111 (H) 70 - 99 mg/dL    Ct Abdomen Pelvis Wo Contrast  Result Date: 07/24/2018 CLINICAL DATA:  Abdominal pain.  History of diverticulitis. EXAM: CT ABDOMEN AND PELVIS WITHOUT CONTRAST TECHNIQUE: Multidetector CT imaging of the abdomen and pelvis was performed following the standard protocol without IV contrast. COMPARISON:  CT abdomen pelvis dated Apr 10, 2016. FINDINGS: Lower chest: No acute abnormality. New 9 x 5 mm pulmonary nodule in the left lower lobe (series 4, image 13). Hepatobiliary: Hepatic steatosis. No focal liver abnormality. Small layering gallstones. No gallbladder wall thickening or biliary dilatation. Pancreas: Unremarkable. No pancreatic ductal dilatation or surrounding inflammatory changes. Spleen: Normal in size without focal abnormality. Adrenals/Urinary Tract: The adrenal glands are unremarkable. Unchanged bilateral renal simple cysts. Bilateral hilar renal vascular calcifications. Punctate nonobstructive calculi in both kidneys. Unchanged chronic mild right hydronephrosis with decompressed right ureter. The bladder is unremarkable.  Stomach/Bowel: Moderate colonic diverticulosis. Large amount of stool within the sigmoid colon with moderate wall thickening and prominent surrounding inflammatory changes. Focal disruption of the anterior sigmoid colonic wall (series 2, image 62 with adjacent 2.0 x 6.8 x 5.4 cm gas and stool containing collection. The colonic wall defect measures approximately 1.6 cm. The stomach and small bowel are unremarkable.  No obstruction. Vascular/Lymphatic: Interval enlargement of the infrarenal abdominal aortic aneurysm, now measuring 3.8 cm, previously 3.3 cm. Aortoiliac atherosclerosis. Multiple reactive mesenteric lymph nodes in the pelvis. No enlarged abdominal or pelvic lymph nodes. Reproductive: Multiple calcified uterine fibroids.  No adnexal mass. Other: Small amount of free fluid in the mesentery and pelvis. Multiple small foci of pneumoperitoneum near the site of sigmoid colonic perforation and in the mesentery. Musculoskeletal: No acute or significant osseous findings. IMPRESSION: 1. Large amount of stool within the sigmoid colon with moderate wall thickening and prominent surrounding inflammatory changes with focal perforation of the anterior sigmoid colonic wall with adjacent 2.0 x 6.8  x 5.4 cm gas and stool containing collection. Additional small foci of pneumoperitoneum and free fluid near the site of perforation and within the mesentery. 2. Etiology of underlying sigmoid colonic inflammation could be related to stercoral colitis or diverticulitis. 3. New 7 mm aggregate pulmonary nodule in the left lower lobe. Non-contrast chest CT at 6-12 months is recommended. If the nodule is stable at time of repeat CT, then future CT at 18-24 months (from today's scan) is considered optional for low-risk patients, but is recommended for high-risk patients. This recommendation follows the consensus statement: Guidelines for Management of Incidental Pulmonary Nodules Detected on CT Images: From the Fleischner Society  2017; Radiology 2017; 284:228-243. 4. Enlargement of the infrarenal abdominal aortic aneurysm, now measuring 3.8 cm. Recommend followup by ultrasound in 2 years. This recommendation follows ACR consensus guidelines: White Paper of the ACR Incidental Findings Committee II on Vascular Findings. J Am Coll Radiol 2013; 10:789-794. 5.  Aortic atherosclerosis (ICD10-I70.0). 6. Hepatic steatosis. 7. Cholelithiasis. 8. Bilateral nonobstructive nephrolithiasis. 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 Critical Value/emergent results were called by telephone at the time of interpretation on 07/24/2018 at 11:36 am to Dr. Noemi Chapel, who verbally acknowledged these results. Electronically Signed   By: Titus Dubin M.D.   On: 07/24/2018 11:44   Dg Chest Portable 1 View  Result Date: 07/24/2018 CLINICAL DATA:  History of breast carcinoma.  Upper abdominal pain EXAM: PORTABLE CHEST 1 VIEW COMPARISON:  June 30, 2015 FINDINGS: Upright image obtained. Lungs are clear. Heart is upper normal in size with pulmonary vascularity normal. No adenopathy. There is aortic atherosclerosis. No evident bone lesions. No pneumoperitoneum is demonstrated on this study. IMPRESSION: No edema or consolidation. No demonstrable pneumoperitoneum. There is aortic atherosclerosis. Aortic Atherosclerosis (ICD10-I70.0). Electronically Signed   By: Lowella Grip III M.D.   On: 07/24/2018 09:57    Review of Systems  Constitutional: Negative for weight loss.  HENT: Negative for ear discharge, ear pain, hearing loss and tinnitus.   Eyes: Negative for blurred vision, double vision, photophobia and pain.  Respiratory: Negative for cough, sputum production and shortness of breath.   Cardiovascular: Negative for chest pain.  Gastrointestinal: Positive for abdominal pain. Negative for nausea and vomiting.  Genitourinary: Negative for dysuria, flank pain, frequency and urgency.  Musculoskeletal: Negative for  back pain, falls, joint pain, myalgias and neck pain.  Neurological: Negative for dizziness, tingling, sensory change, focal weakness, loss of consciousness and headaches.  Endo/Heme/Allergies: Does not bruise/bleed easily.  Psychiatric/Behavioral: Negative for depression, memory loss and substance abuse. The patient is not nervous/anxious.    Blood pressure 100/66, pulse (!) 114, temperature 98.8 F (37.1 C), temperature source Oral, resp. rate 17, height 5' 8"  (1.727 m), weight 72.6 kg, SpO2 91 %. Physical Exam WDWN in NAD Eyes:  Pupils equal, round; sclera anicteric HENT:  Oral mucosa moist; good dentition  Neck:  No masses palpated, no thyromegaly Lungs:  CTA bilaterally; normal respiratory effort CV:  Regular rate and rhythm; no murmurs; extremities well-perfused with no edema Abd:  +bowel sounds, mildly distended with tenderness mostly in the LLQ.  Some voluntary guarding in LLQ.  No generalized symptoms.  no palpable organomegaly; no palpable hernias Skin:  Warm, dry; no sign of jaundice Psychiatric - alert and oriented x 4; calm mood and affect The abdomen is mildly distended with point tenderness noted in the left lower quadrant.  When I palpate the right side of her abdomen, it is soft and she  states that it does not hurt.  She is not rigid in other areas of the abdomen other than the left lower quadrant.  Assessment/Plan: 1.  Perforated sigmoid diverticulitis vs. Stercoral perforation 2.  Hyponatremia 3.  Coronary artery disease 4.  Atrial fibrillation 5.  Anticoagulation with high therapeutic INR.  The patient does not appear to have indications for emergent surgery tonight.  We will try to reverse her INR with vitamin K as well as FFP.  It is doubtful that this abscess and relatively large perforation will resolve without surgical resection.  Would recommend cardiac clearance and holding all anticoagulation.  Would correct hyponatremia.    The patient will likely need to have  urgent sigmoid colectomy this admission.  It is likely that she will need to have some type of temporary diversion whether it be a Hartman's procedure or a loop ileostomy.  Continue IV antibiotics for now.  I have discussed this with the attending hospitalist.  We will go ahead and order the FFP tonight.  She is n.p.o. except for ice chips.  Imogene Burn Dejion Grillo 07/24/2018, 6:44 PM

## 2018-07-24 NOTE — Consult Note (Signed)
Reason for Consult: Perforated sigmoid diverticulitis Referring Physician: Dr. Arlyn Leak is an 72 y.o. female.  HPI: Patient is a 73 year old white female with multiple medical problems who presented to the emergency room with worsening left lower quadrant abdominal pain.  She states she has had a history of diverticulitis and had been on antibiotics for approximately 8 days.  She presented to the emergency room with tachycardia, hypotension, and significant abdominal pain.  CT scan of the abdomen reveals extensive sigmoid diverticulitis with evidence of perforation containing air and stool.  There is one focus of free air just outside the area.  She does not have frank pneumoperitoneum.  She has been taking Coumadin for atrial fibrillation and her INR is currently 3.0.  Since her admission, her vital signs have stabilized.  Her lactic acid level was 2.4, with another 1 being repeated.  She states her pain is somewhat better, though still localized to the left lower quadrant.  She denies any nausea or vomiting.  Past Medical History:  Diagnosis Date  . AAA (abdominal aortic aneurysm) ([place])   . Acute diverticulitis   . Anxiety   . Arthritis   . Atrial fibrillation ([place])    a. Dx 03/2011 - on tikosyn/coumadin.  [place] Kitchen CAD (coronary artery disease)    a. NSTEMI 02/2011: occ mid Cx, DES to OM2, residual nonobst LAD dz.  . Cancer ([place])    breast  . Diabetes mellitus   . Diverticulitis   . Diverticulitis of colon     2008, 04/2011, 12/2014, 08/2015  . 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 Hemangioma    liver  . HLD (hyperlipidemia)   . HTN (hypertension)   . Osteoporosis   . Tobacco abuse    stopped smoking 2012  . Ureteral obstruction    History of gross hematuria/right hydronephrosis 2/2 to uteropelvic junction obstruction, s/p cystoscopy in January 2007 with bilateral retrograde pyelography, right ureter arthroscopy, right ureteral stent placement, bladder biopsies, stent  removal since then.  . Wears dentures    top    Past Surgical History:  Procedure Laterality Date  . BIOPSY N/A 11/01/2015   Procedure: BIOPSY;  Surgeon: Danie Binder, MD;  Location: [facility];  Service: Endoscopy;  Laterality: N/A;  . BREAST LUMPECTOMY WITH NEEDLE LOCALIZATION AND AXILLARY SENTINEL LYMPH NODE BX Right 03/01/2014   Procedure: BREAST LUMPECTOMY WITH NEEDLE LOCALIZATION AND AXILLARY SENTINEL LYMPH NODE BX;  Surgeon: Edward Jolly, MD;  Location: [facility];  Service: General;  Laterality: Right;  . BREAST SURGERY    . CARDIAC CATHETERIZATION  2012   stent  . COLONOSCOPY WITH PROPOFOL N/A 06/29/2014   Dr. Wynetta Emery: universal diverticulosis  . CORONARY STENT PLACEMENT  2012  . ESOPHAGOGASTRODUODENOSCOPY (EGD) WITH PROPOFOL N/A 11/01/2015   Procedure: ESOPHAGOGASTRODUODENOSCOPY (EGD) WITH PROPOFOL;  Surgeon: Danie Binder, MD;  Location: [facility];  Service: Endoscopy;  Laterality: N/A;  . KNEE ARTHROSCOPY     left  . Right leg surgery  age 77   As a child for  ?scleroderma per patient  . TONSILLECTOMY AND ADENOIDECTOMY    . TUBAL LIGATION    . Ureteral surgery  2011   rt ureterostomy-stent    Family History  Problem Relation Age of Onset  . Lung cancer Father 33       deceased  . Cancer Father   . Heart failure Mother 57       deceased  . Heart disease Mother   .  Diabetes Brother   . Hypertension Brother   . Cancer Brother        prostate  . Colon cancer Neg Hx   . Liver disease Neg Hx     Social History:  reports that she quit smoking about 7 years ago. Her smoking use included cigarettes. She has a 100.00 pack-year smoking history. She has never used smokeless tobacco. She reports that she does not drink alcohol or use drugs.  Allergies:  Allergies  Allergen Reactions  . Miralax [Polyethylene Glycol] Swelling and Rash    Took [facility] brand developed rash. Patient states she tolerated name brand MiraLax in the past.  09/01/15. Patient states  she had diffuse swelling including swelling of her lips.   . Vancomycin Rash and Shortness Of Breath  . Acetaminophen Hives  . Oxycodone-Acetaminophen Itching  . Sulfa Antibiotics Rash    All over rash  . Sulfacetamide Sodium Rash    All over rash  . Sulfasalazine Rash    All over rash All over rash  . Banana Other (See Comments)    Unknown  . Latex Rash  . Tape Rash    Medications: I have reviewed the patient's current medications.  Results for orders placed or performed during the hospital encounter of 07/24/18 (from the past 48 hour(s))  Comprehensive metabolic panel     Status: Abnormal   Collection Time: 07/24/18  9:44 AM  Result Value Ref Range   Sodium 124 (L) 135 - 145 mmol/L   Potassium 4.6 3.5 - 5.1 mmol/L   Chloride 94 (L) 98 - 111 mmol/L   CO2 16 (L) 22 - 32 mmol/L   Glucose, Bld 152 (H) 70 - 99 mg/dL   BUN 58 (H) 8 - 23 mg/dL   Creatinine, Ser 2.18 (H) 0.44 - 1.00 mg/dL   Calcium 9.5 8.9 - 10.3 mg/dL   Total Protein 6.8 6.5 - 8.1 g/dL   Albumin 2.7 (L) 3.5 - 5.0 g/dL   AST 20 15 - 41 U/L   ALT 15 0 - 44 U/L   Alkaline Phosphatase 120 38 - 126 U/L   Total Bilirubin 1.8 (H) 0.3 - 1.2 mg/dL   GFR calc non Af Amer 21 (L) >60 mL/min   GFR calc Af Amer 24 (L) >60 mL/min    Comment: (NOTE) The eGFR has been calculated using the CKD EPI equation. This calculation has not been validated in all clinical situations. eGFR's persistently <60 mL/min signify possible Chronic Kidney Disease.    Anion gap 14 5 - 15    Comment: Performed at [facility], [address], [place], [place] [address]  Lipase, blood     Status: None   Collection Time: 07/24/18  9:44 AM  Result Value Ref Range   Lipase 24 11 - 51 U/L    Comment: Performed at [facility], [address], [place], [place] [address]  CBC with Diff     Status: Abnormal   Collection Time: 07/24/18  9:44 AM  Result Value Ref Range   WBC 5.2 4.0 - 10.5 K/uL   RBC 4.49 3.87 - 5.11 MIL/uL   Hemoglobin 12.8 12.0 -  15.0 g/dL   HCT 37.0 36.0 - 46.0 %   MCV 82.4 78.0 - 100.0 fL   MCH 28.5 26.0 - 34.0 pg   MCHC 34.6 30.0 - 36.0 g/dL   RDW 17.1 (H) 11.5 - 15.5 %   Platelets 193 150 - 400 K/uL   Neutrophils Relative % 81 %  Lymphocytes Relative 7 %   Monocytes Relative 12 %   Eosinophils Relative 0 %   Basophils Relative 0 %   Neutro Abs 4.2 1.7 - 7.7 K/uL   Lymphs Abs 0.4 (L) 0.7 - 4.0 K/uL   Monocytes Absolute 0.6 0.1 - 1.0 K/uL   Eosinophils Absolute 0.0 0.0 - 0.7 K/uL   Basophils Absolute 0.0 0.0 - 0.1 K/uL   WBC Morphology MILD LEFT SHIFT (1-5% METAS, OCC MYELO, OCC BANDS)     Comment: Performed at [facility], [address], [place], [place] [address]  Lactic acid, plasma     Status: Abnormal   Collection Time: 07/24/18  9:44 AM  Result Value Ref Range   Lactic Acid, Venous 2.4 (HH) 0.5 - 1.9 mmol/L    Comment: CRITICAL RESULT CALLED TO, READ BACK BY AND VERIFIED WITH: MINTER B. AT 1030A ON [id number] BY THOMPSON S. Performed at [facility], [address] [place], [place] [address]   Protime-INR     Status: Abnormal   Collection Time: 07/24/18  9:44 AM  Result Value Ref Range   Prothrombin Time 31.0 (H) 11.4 - 15.2 seconds   INR 3.01     Comment: Performed at [facility], [address], [place], [place] [address]  Blood Culture (routine x 2)     Status: None (Preliminary result)   Collection Time: 07/24/18 11:07 AM  Result Value Ref Range   Specimen Description BLOOD LEFT ARM    Special Requests      BOTTLES DRAWN AEROBIC AND ANAEROBIC Blood Culture adequate volume Performed at [facility], [address] [place], [place] [address]    Culture PENDING    Report Status PENDING   Blood Culture (routine x 2)     Status: None (Preliminary result)   Collection Time: 07/24/18 11:07 AM  Result Value Ref Range   Specimen Description BLOOD LEFT HAND    Special Requests      BOTTLES DRAWN AEROBIC AND ANAEROBIC Blood Culture adequate volume Performed at [facility], [address],  [place], [place] [address]    Culture PENDING    Report Status PENDING     Ct Abdomen Pelvis Wo Contrast  Result Date: 07/24/2018 CLINICAL DATA:  Abdominal pain.  History of diverticulitis. EXAM: CT ABDOMEN AND PELVIS WITHOUT CONTRAST TECHNIQUE: Multidetector CT imaging of the abdomen and pelvis was performed following the standard protocol without IV contrast. COMPARISON:  CT abdomen pelvis dated Apr 10, 2016. FINDINGS: Lower chest: No acute abnormality. New 9 x 5 mm pulmonary nodule in the left lower lobe (series 4, image 13). Hepatobiliary: Hepatic steatosis. No focal liver abnormality. Small layering gallstones. No gallbladder wall thickening or biliary dilatation. Pancreas: Unremarkable. No pancreatic ductal dilatation or surrounding inflammatory changes. Spleen: Normal in size without focal abnormality. Adrenals/Urinary Tract: The adrenal glands are unremarkable. Unchanged bilateral renal simple cysts. Bilateral hilar renal vascular calcifications. Punctate nonobstructive calculi in both kidneys. Unchanged chronic mild right hydronephrosis with decompressed right ureter. The bladder is unremarkable. Stomach/Bowel: Moderate colonic diverticulosis. Large amount of stool within the sigmoid colon with moderate wall thickening and prominent surrounding inflammatory changes. Focal disruption of the anterior sigmoid colonic wall (series 2, image 62 with adjacent 2.0 x 6.8 x 5.4 cm gas and stool containing collection. The colonic wall defect measures approximately 1.6 cm. The stomach and small bowel are unremarkable.  No obstruction. Vascular/Lymphatic: Interval enlargement of the infrarenal abdominal aortic aneurysm, now measuring 3.8 cm, previously 3.3 cm. Aortoiliac atherosclerosis. Multiple reactive mesenteric  lymph nodes in the pelvis. No enlarged abdominal or pelvic lymph nodes. Reproductive: Multiple calcified uterine fibroids.  No adnexal mass. Other: Small amount of free fluid in the mesentery and pelvis.  Multiple small foci of pneumoperitoneum near the site of sigmoid colonic perforation and in the mesentery. Musculoskeletal: No acute or significant osseous findings. IMPRESSION: 1. Large amount of stool within the sigmoid colon with moderate wall thickening and prominent surrounding inflammatory changes with focal perforation of the anterior sigmoid colonic wall with adjacent 2.0 x 6.8 x 5.4 cm gas and stool containing collection. Additional small foci of pneumoperitoneum and free fluid near the site of perforation and within the mesentery. 2. Etiology of underlying sigmoid colonic inflammation could be related to stercoral colitis or diverticulitis. 3. New 7 mm aggregate pulmonary nodule in the left lower lobe. Non-contrast chest CT at 6-12 months is recommended. If the nodule is stable at time of repeat CT, then future CT at 18-24 months (from today's scan) is considered optional for low-risk patients, but is recommended for high-risk patients. This recommendation follows the consensus statement: Guidelines for Management of Incidental Pulmonary Nodules Detected on CT Images: From the Fleischner Society 2017; Radiology 2017; 284:228-243. 4. Enlargement of the infrarenal abdominal aortic aneurysm, now measuring 3.8 cm. Recommend followup by ultrasound in 2 years. This recommendation follows ACR consensus guidelines: White Paper of the ACR Incidental Findings Committee II on Vascular Findings. J Am Coll Radiol 2013; 10:789-794. 5.  Aortic atherosclerosis (ICD10-I70.0). 6. Hepatic steatosis. 7. Cholelithiasis. 8. Bilateral nonobstructive nephrolithiasis. 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 Critical Value/emergent results were called by telephone at the time of interpretation on 07/24/2018 at 11:36 am to Dr. Noemi Chapel, who verbally acknowledged these results. Electronically Signed   By: Titus Dubin M.D.   On: 07/24/2018 11:44   Dg Chest Portable 1 View  Result  Date: 07/24/2018 CLINICAL DATA:  History of breast carcinoma.  Upper abdominal pain EXAM: PORTABLE CHEST 1 VIEW COMPARISON:  June 30, 2015 FINDINGS: Upright image obtained. Lungs are clear. Heart is upper normal in size with pulmonary vascularity normal. No adenopathy. There is aortic atherosclerosis. No evident bone lesions. No pneumoperitoneum is demonstrated on this study. IMPRESSION: No edema or consolidation. No demonstrable pneumoperitoneum. There is aortic atherosclerosis. Aortic Atherosclerosis (ICD10-I70.0). Electronically Signed   By: Lowella Grip III M.D.   On: 07/24/2018 09:57    ROS:  Pertinent items are noted in HPI.  Blood pressure 112/63, pulse (!) 110, temperature 97.9 F (36.6 C), temperature source Oral, resp. rate 16, height _0  (1.727 m), weight 72.6 kg, SpO2 92 %. Physical Exam: Pleasant white female who is lying on the cart in no acute distress. Head is normocephalic, atraumatic Lungs clear to auscultation with equal breath sounds bilaterally Heart examination reveals an irregularly irregular rhythm. The abdomen is mildly distended with point tenderness noted in the left lower quadrant.  When I palpate the right side of her abdomen, it is soft and she states that it does not hurt.  She is not rigid in other areas of the abdomen other than the left lower quadrant. CT scan images personally reviewed Medical history reviewed  Assessment/Plan: Impression: Perforated sigmoid diverticulitis, hyper coagulable due to Coumadin use, chronic atrial fibrillation, history of diverticulosis, renal insufficiency, hyponatremia Plan: Patient's blood pressure has responded to IV hydration.  Patient does not need acute surgical intervention as she does not have frank peritonitis or pneumoperitoneum.  She may need a Hartman's procedure should she  not respond to IV antibiotics and possible IR drainage of the contained perforation.  Percutaneous drainage may be difficult secondary to stool  within the area of perforation.  She will need multiple blood products and care by critical care which ideally should be done at a higher level facility.  Patient states that she has received care in [place] in the past and would like to continue that if at all possible.  She does appear stable for transfer.    Aviva Signs 07/24/2018, 1:53 PM

## 2018-07-24 NOTE — ED Notes (Signed)
CRITICAL VALUE ALERT  Critical Value:  Lactic Acid 2.4  Date & Time Notied:  07/24/18, 1031  Provider Notified: Evalee Jefferson, PA  Orders Received/Actions taken: no new orders at this time.

## 2018-07-25 ENCOUNTER — Encounter (HOSPITAL_COMMUNITY): Payer: Self-pay

## 2018-07-25 ENCOUNTER — Ambulatory Visit: Payer: Medicare Other | Admitting: Family Medicine

## 2018-07-25 ENCOUNTER — Inpatient Hospital Stay (HOSPITAL_COMMUNITY): Payer: Medicare Other | Admitting: Anesthesiology

## 2018-07-25 ENCOUNTER — Inpatient Hospital Stay (HOSPITAL_COMMUNITY): Payer: Medicare Other

## 2018-07-25 ENCOUNTER — Encounter (HOSPITAL_COMMUNITY)
Admission: EM | Disposition: A | Discharge status: Rehabilitation Facility | Payer: Self-pay | Source: Home / Self Care | Attending: Internal Medicine

## 2018-07-25 DIAGNOSIS — I480 Paroxysmal atrial fibrillation: Secondary | ICD-10-CM

## 2018-07-25 DIAGNOSIS — I2510 Atherosclerotic heart disease of native coronary artery without angina pectoris: Secondary | ICD-10-CM

## 2018-07-25 DIAGNOSIS — I714 Abdominal aortic aneurysm, without rupture: Secondary | ICD-10-CM

## 2018-07-25 DIAGNOSIS — Z01810 Encounter for preprocedural cardiovascular examination: Secondary | ICD-10-CM

## 2018-07-25 DIAGNOSIS — A419 Sepsis, unspecified organism: Principal | ICD-10-CM

## 2018-07-25 HISTORY — PX: COLON RESECTION: SHX5231

## 2018-07-25 HISTORY — PX: LAPAROTOMY: SHX154

## 2018-07-25 HISTORY — PX: PARTIAL COLECTOMY: SHX5273

## 2018-07-25 LAB — POCT I-STAT 3, ART BLOOD GAS (G3+)
ACID-BASE DEFICIT: 6 mmol/L — AB (ref 0.0–2.0)
ACID-BASE DEFICIT: 3 mmol/L — AB (ref 0.0–2.0)
Acid-base deficit: 9 mmol/L — ABNORMAL HIGH (ref 0.0–2.0)
BICARBONATE: 18.5 mmol/L — AB (ref 20.0–28.0)
BICARBONATE: 22.2 mmol/L (ref 20.0–28.0)
Bicarbonate: 20.6 mmol/L (ref 20.0–28.0)
O2 SAT: 99 %
O2 SAT: 100 %
O2 Saturation: 98 %
TCO2: 20 mmol/L — AB (ref 22–32)
TCO2: 22 mmol/L (ref 22–32)
TCO2: 23 mmol/L (ref 22–32)
pCO2 arterial: 48.5 mmHg — ABNORMAL HIGH (ref 32.0–48.0)
pCO2 arterial: 46.5 mmHg (ref 32.0–48.0)
pCO2 arterial: 40 mmHg (ref 32.0–48.0)
pH, Arterial: 7.191 — CL (ref 7.350–7.450)
pH, Arterial: 7.255 — ABNORMAL LOW (ref 7.350–7.450)
pH, Arterial: 7.352 (ref 7.350–7.450)
pO2, Arterial: 178 mmHg — ABNORMAL HIGH (ref 83.0–108.0)
pO2, Arterial: 116 mmHg — ABNORMAL HIGH (ref 83.0–108.0)
pO2, Arterial: 255 mmHg — ABNORMAL HIGH (ref 83.0–108.0)

## 2018-07-25 LAB — PHOSPHORUS: Phosphorus: 4.5 mg/dL (ref 2.5–4.6)

## 2018-07-25 LAB — BASIC METABOLIC PANEL
Anion gap: 9 (ref 5–15)
BUN: 45 mg/dL — AB (ref 8–23)
CALCIUM: 8.1 mg/dL — AB (ref 8.9–10.3)
CHLORIDE: 107 mmol/L (ref 98–111)
CO2: 22 mmol/L (ref 22–32)
CREATININE: 0.98 mg/dL (ref 0.44–1.00)
GFR, EST NON AFRICAN AMERICAN: 55 mL/min — AB (ref >60)
Glucose, Bld: 102 mg/dL — ABNORMAL HIGH (ref 70–99)
Potassium: 3.9 mmol/L (ref 3.5–5.1)
SODIUM: 138 mmol/L (ref 135–145)

## 2018-07-25 LAB — CBC WITH DIFFERENTIAL/PLATELET
BASOS ABS: 0 10*3/uL (ref 0.0–0.1)
BASOS PCT: 1 %
Eosinophils Absolute: 0 10*3/uL (ref 0.0–0.7)
Eosinophils Relative: 2 %
HCT: 24.6 % — ABNORMAL LOW (ref 36.0–46.0)
Hemoglobin: 8.1 g/dL — ABNORMAL LOW (ref 12.0–15.0)
LYMPHS ABS: 0.1 10*3/uL — AB (ref 0.7–4.0)
LYMPHS PCT: 6 %
MCH: 27.7 pg (ref 26.0–34.0)
MCHC: 32.9 g/dL (ref 30.0–36.0)
MCV: 84.2 fL (ref 78.0–100.0)
MONO ABS: 0.2 10*3/uL (ref 0.1–1.0)
Monocytes Relative: 8 %
Neutro Abs: 1.8 10*3/uL (ref 1.7–7.7)
Neutrophils Relative %: 83 %
PLATELETS: 125 10*3/uL — AB (ref 150–400)
RBC: 2.92 MIL/uL — ABNORMAL LOW (ref 3.87–5.11)
RDW: 17.8 % — AB (ref 11.5–15.5)
WBC: 2.1 10*3/uL — ABNORMAL LOW (ref 4.0–10.5)

## 2018-07-25 LAB — CBC
HCT: 34.1 % — ABNORMAL LOW (ref 36.0–46.0)
Hemoglobin: 10.9 g/dL — ABNORMAL LOW (ref 12.0–15.0)
MCH: 27.5 pg (ref 26.0–34.0)
MCHC: 32 g/dL (ref 30.0–36.0)
MCV: 86.1 fL (ref 78.0–100.0)
PLATELETS: 158 10*3/uL (ref 150–400)
RBC: 3.96 MIL/uL (ref 3.87–5.11)
RDW: 17.5 % — AB (ref 11.5–15.5)
WBC: 5.5 10*3/uL (ref 4.0–10.5)

## 2018-07-25 LAB — COMPREHENSIVE METABOLIC PANEL
ALT: 19 U/L (ref 0–44)
AST: 26 U/L (ref 15–41)
Albumin: 2.1 g/dL — ABNORMAL LOW (ref 3.5–5.0)
Alkaline Phosphatase: 98 U/L (ref 38–126)
Anion gap: 10 (ref 5–15)
BUN: 54 mg/dL — AB (ref 8–23)
CO2: 16 mmol/L — ABNORMAL LOW (ref 22–32)
CREATININE: 1.49 mg/dL — AB (ref 0.44–1.00)
Calcium: 8.7 mg/dL — ABNORMAL LOW (ref 8.9–10.3)
Chloride: 104 mmol/L (ref 98–111)
GFR calc Af Amer: 39 mL/min — ABNORMAL LOW (ref >60)
GFR, EST NON AFRICAN AMERICAN: 33 mL/min — AB (ref >60)
Glucose, Bld: 86 mg/dL (ref 70–99)
Potassium: 4.9 mmol/L (ref 3.5–5.1)
Sodium: 130 mmol/L — ABNORMAL LOW (ref 135–145)
TOTAL PROTEIN: 5.4 g/dL — AB (ref 6.5–8.1)
Total Bilirubin: 2.7 mg/dL — ABNORMAL HIGH (ref 0.3–1.2)

## 2018-07-25 LAB — POCT I-STAT 7, (LYTES, BLD GAS, ICA,H+H)
ACID-BASE DEFICIT: 10 mmol/L — AB (ref 0.0–2.0)
Bicarbonate: 17.2 mmol/L — ABNORMAL LOW (ref 20.0–28.0)
CALCIUM ION: 1.31 mmol/L (ref 1.15–1.40)
HCT: 27 % — ABNORMAL LOW (ref 36.0–46.0)
HEMOGLOBIN: 9.2 g/dL — AB (ref 12.0–15.0)
O2 Saturation: 96 %
PH ART: 7.205 — AB (ref 7.350–7.450)
POTASSIUM: 4.3 mmol/L (ref 3.5–5.1)
SODIUM: 135 mmol/L (ref 135–145)
TCO2: 19 mmol/L — ABNORMAL LOW (ref 22–32)
pCO2 arterial: 43.5 mmHg (ref 32.0–48.0)
pO2, Arterial: 99 mmHg (ref 83.0–108.0)

## 2018-07-25 LAB — SURGICAL PCR SCREEN
MRSA, PCR: NEGATIVE
STAPHYLOCOCCUS AUREUS: NEGATIVE

## 2018-07-25 LAB — GLUCOSE, CAPILLARY
GLUCOSE-CAPILLARY: 103 mg/dL — AB (ref 70–99)
GLUCOSE-CAPILLARY: 105 mg/dL — AB (ref 70–99)
GLUCOSE-CAPILLARY: 95 mg/dL (ref 70–99)
Glucose-Capillary: 78 mg/dL (ref 70–99)
Glucose-Capillary: 87 mg/dL (ref 70–99)
Glucose-Capillary: 92 mg/dL (ref 70–99)

## 2018-07-25 LAB — MAGNESIUM: MAGNESIUM: 1.8 mg/dL (ref 1.7–2.4)

## 2018-07-25 LAB — PROTIME-INR
INR: 1.23
Prothrombin Time: 15.4 seconds — ABNORMAL HIGH (ref 11.4–15.2)

## 2018-07-25 LAB — LACTIC ACID, PLASMA: Lactic Acid, Venous: 1.2 mmol/L (ref 0.5–1.9)

## 2018-07-25 LAB — MRSA PCR SCREENING: MRSA BY PCR: NEGATIVE

## 2018-07-25 LAB — TRIGLYCERIDES: TRIGLYCERIDES: 141 mg/dL (ref <150)

## 2018-07-25 LAB — TROPONIN I

## 2018-07-25 SURGERY — COLON RESECTION
Anesthesia: General | Site: Abdomen

## 2018-07-25 MED ORDER — FENTANYL CITRATE (PF) 100 MCG/2ML IJ SOLN
50.0000 ug | INTRAMUSCULAR | Status: DC | PRN
  Administered 2018-07-25: 50 ug via INTRAVENOUS
  Filled 2018-07-25: qty 2

## 2018-07-25 MED ORDER — VASOPRESSIN 20 UNIT/ML IV SOLN
INTRAVENOUS | Status: AC
  Filled 2018-07-25: qty 1

## 2018-07-25 MED ORDER — ENOXAPARIN SODIUM 40 MG/0.4ML ~~LOC~~ SOLN
40.0000 mg | SUBCUTANEOUS | Status: DC
  Filled 2018-07-25: qty 0.4

## 2018-07-25 MED ORDER — STERILE WATER FOR INJECTION IV SOLN
INTRAVENOUS | Status: DC
  Administered 2018-07-25: 18:00:00 via INTRAVENOUS
  Filled 2018-07-25 (×2): qty 850

## 2018-07-25 MED ORDER — LACTATED RINGERS IV BOLUS
1000.0000 mL | Freq: Once | INTRAVENOUS | Status: AC
  Administered 2018-07-25: 1000 mL via INTRAVENOUS

## 2018-07-25 MED ORDER — LIDOCAINE 2% (20 MG/ML) 5 ML SYRINGE
INTRAMUSCULAR | Status: DC | PRN
  Administered 2018-07-25: 80 mg via INTRAVENOUS

## 2018-07-25 MED ORDER — MAGNESIUM SULFATE 2 GM/50ML IV SOLN
2.0000 g | Freq: Once | INTRAVENOUS | Status: AC
  Administered 2018-07-25: 2 g via INTRAVENOUS
  Filled 2018-07-25: qty 50

## 2018-07-25 MED ORDER — METOPROLOL TARTRATE 5 MG/5ML IV SOLN
2.5000 mg | INTRAVENOUS | Status: DC | PRN
  Administered 2018-07-25: 5 mg via INTRAVENOUS
  Administered 2018-07-25: 2.5 mg via INTRAVENOUS
  Filled 2018-07-25 (×3): qty 5

## 2018-07-25 MED ORDER — FENTANYL CITRATE (PF) 100 MCG/2ML IJ SOLN: 50.0000 ug | INTRAMUSCULAR | Status: DC | PRN

## 2018-07-25 MED ORDER — DEXAMETHASONE SODIUM PHOSPHATE 10 MG/ML IJ SOLN
INTRAMUSCULAR | Status: AC
  Filled 2018-07-25: qty 1

## 2018-07-25 MED ORDER — PROPOFOL 10 MG/ML IV BOLUS
INTRAVENOUS | Status: DC | PRN
  Administered 2018-07-25: 150 mg via INTRAVENOUS

## 2018-07-25 MED ORDER — VASOPRESSIN 20 UNIT/ML IV SOLN
0.0300 [IU]/min | INTRAVENOUS | Status: DC
  Administered 2018-07-25: 0.03 [IU]/min via INTRAVENOUS
  Filled 2018-07-25: qty 2

## 2018-07-25 MED ORDER — VECURONIUM BROMIDE 10 MG IV SOLR
INTRAVENOUS | Status: DC | PRN
  Administered 2018-07-25 (×2): 4 mg via INTRAVENOUS

## 2018-07-25 MED ORDER — FENTANYL CITRATE (PF) 250 MCG/5ML IJ SOLN
INTRAMUSCULAR | Status: DC | PRN
  Administered 2018-07-25 (×5): 50 ug via INTRAVENOUS

## 2018-07-25 MED ORDER — ROCURONIUM BROMIDE 50 MG/5ML IV SOSY
PREFILLED_SYRINGE | INTRAVENOUS | Status: AC
  Filled 2018-07-25: qty 5

## 2018-07-25 MED ORDER — SODIUM BICARBONATE 8.4 % IV SOLN
50.0000 meq | Freq: Once | INTRAVENOUS | Status: AC
  Administered 2018-07-25: 50 meq via INTRAVENOUS
  Filled 2018-07-25: qty 50

## 2018-07-25 MED ORDER — PHENYLEPHRINE HCL-NACL 10-0.9 MG/250ML-% IV SOLN
0.0000 ug/min | INTRAVENOUS | Status: DC
  Administered 2018-07-25: 20 ug/min via INTRAVENOUS
  Filled 2018-07-25: qty 250

## 2018-07-25 MED ORDER — ALBUMIN HUMAN 5 % IV SOLN
INTRAVENOUS | Status: DC | PRN
  Administered 2018-07-25 (×6): via INTRAVENOUS

## 2018-07-25 MED ORDER — PHENYLEPHRINE 40 MCG/ML (10ML) SYRINGE FOR IV PUSH (FOR BLOOD PRESSURE SUPPORT)
PREFILLED_SYRINGE | INTRAVENOUS | Status: AC
  Filled 2018-07-25: qty 10

## 2018-07-25 MED ORDER — LACTATED RINGERS IV SOLN
INTRAVENOUS | Status: DC
  Administered 2018-07-25: 10:00:00 via INTRAVENOUS
  Administered 2018-07-25: 1000 mL via INTRAVENOUS

## 2018-07-25 MED ORDER — FENTANYL CITRATE (PF) 250 MCG/5ML IJ SOLN
INTRAMUSCULAR | Status: AC
  Filled 2018-07-25: qty 5

## 2018-07-25 MED ORDER — PROPOFOL 1000 MG/100ML IV EMUL: 0.0000 ug/kg/min | INTRAVENOUS | Status: DC

## 2018-07-25 MED ORDER — ONDANSETRON HCL 4 MG/2ML IJ SOLN
INTRAMUSCULAR | Status: AC
  Filled 2018-07-25: qty 2

## 2018-07-25 MED ORDER — FENTANYL BOLUS VIA INFUSION
25.0000 ug | INTRAVENOUS | Status: DC | PRN
  Administered 2018-07-25 – 2018-07-27 (×6): 25 ug via INTRAVENOUS
  Filled 2018-07-25: qty 25

## 2018-07-25 MED ORDER — LIDOCAINE 2% (20 MG/ML) 5 ML SYRINGE
INTRAMUSCULAR | Status: AC
  Filled 2018-07-25: qty 5

## 2018-07-25 MED ORDER — DEXAMETHASONE SODIUM PHOSPHATE 10 MG/ML IJ SOLN
INTRAMUSCULAR | Status: DC | PRN
  Administered 2018-07-25: 10 mg via INTRAVENOUS

## 2018-07-25 MED ORDER — ROCURONIUM BROMIDE 10 MG/ML (PF) SYRINGE
PREFILLED_SYRINGE | INTRAVENOUS | Status: DC | PRN
  Administered 2018-07-25: 50 mg via INTRAVENOUS

## 2018-07-25 MED ORDER — PROMETHAZINE HCL 25 MG/ML IJ SOLN: 6.2500 mg | INTRAMUSCULAR | Status: DC | PRN

## 2018-07-25 MED ORDER — LACTATED RINGERS IV SOLN
INTRAVENOUS | Status: DC | PRN
  Administered 2018-07-25: 10:00:00 via INTRAVENOUS

## 2018-07-25 MED ORDER — VECURONIUM BROMIDE 10 MG IV SOLR
INTRAVENOUS | Status: AC
  Filled 2018-07-25: qty 10

## 2018-07-25 MED ORDER — SODIUM BICARBONATE 4.2 % IV SOLN
INTRAVENOUS | Status: DC | PRN
  Administered 2018-07-25: 50 mL via INTRAVENOUS

## 2018-07-25 MED ORDER — SUCCINYLCHOLINE CHLORIDE 200 MG/10ML IV SOSY
PREFILLED_SYRINGE | INTRAVENOUS | Status: AC
  Filled 2018-07-25: qty 10

## 2018-07-25 MED ORDER — SODIUM CHLORIDE 0.9 % IV SOLN
INTRAVENOUS | Status: DC | PRN
  Administered 2018-07-25: 5 ug/min via INTRAVENOUS

## 2018-07-25 MED ORDER — PROPOFOL 10 MG/ML IV BOLUS
INTRAVENOUS | Status: AC
  Filled 2018-07-25: qty 20

## 2018-07-25 MED ORDER — PROPOFOL 1000 MG/100ML IV EMUL
INTRAVENOUS | Status: AC
  Filled 2018-07-25: qty 100

## 2018-07-25 MED ORDER — ORAL CARE MOUTH RINSE
15.0000 mL | OROMUCOSAL | Status: DC
  Administered 2018-07-25 – 2018-07-27 (×22): 15 mL via OROMUCOSAL

## 2018-07-25 MED ORDER — CHLORHEXIDINE GLUCONATE 0.12% ORAL RINSE (MEDLINE KIT)
15.0000 mL | Freq: Two times a day (BID) | OROMUCOSAL | Status: DC
  Administered 2018-07-25 – 2018-07-27 (×6): 15 mL via OROMUCOSAL

## 2018-07-25 MED ORDER — FENTANYL CITRATE (PF) 100 MCG/2ML IJ SOLN
50.0000 ug | Freq: Once | INTRAMUSCULAR | Status: AC
  Administered 2018-07-25: 50 ug via INTRAVENOUS
  Filled 2018-07-25: qty 2

## 2018-07-25 MED ORDER — FENTANYL CITRATE (PF) 100 MCG/2ML IJ SOLN: 25.0000 ug | INTRAMUSCULAR | Status: DC | PRN

## 2018-07-25 MED ORDER — VASOPRESSIN 20 UNIT/ML IV SOLN
INTRAVENOUS | Status: DC | PRN
  Administered 2018-07-25: 2 [IU] via INTRAVENOUS

## 2018-07-25 MED ORDER — SODIUM CHLORIDE 0.9 % IV BOLUS
1000.0000 mL | Freq: Once | INTRAVENOUS | Status: AC
  Administered 2018-07-25: 1000 mL via INTRAVENOUS

## 2018-07-25 MED ORDER — FENTANYL 2500MCG IN NS 250ML (10MCG/ML) PREMIX INFUSION
25.0000 ug/h | INTRAVENOUS | Status: DC
  Administered 2018-07-25: 50 ug/h via INTRAVENOUS
  Administered 2018-07-26: 150 ug/h via INTRAVENOUS
  Administered 2018-07-26: 250 ug/h via INTRAVENOUS
  Administered 2018-07-27: 50 ug/h via INTRAVENOUS
  Filled 2018-07-25 (×4): qty 250

## 2018-07-25 MED ORDER — SUCCINYLCHOLINE CHLORIDE 200 MG/10ML IV SOSY
PREFILLED_SYRINGE | INTRAVENOUS | Status: DC | PRN
  Administered 2018-07-25: 70 mg via INTRAVENOUS

## 2018-07-25 MED ORDER — 0.9 % SODIUM CHLORIDE (POUR BTL) OPTIME
TOPICAL | Status: DC | PRN
  Administered 2018-07-25 (×2): 1000 mL

## 2018-07-25 SURGICAL SUPPLY — 51 items
BIOPATCH RED 1 DISK 7.0 (GAUZE/BANDAGES/DRESSINGS) ×2 IMPLANT
BIOPATCH RED 1IN DISK 7.0MM (GAUZE/BANDAGES/DRESSINGS) ×1
BLADE CLIPPER SURG (BLADE) IMPLANT
BNDG GAUZE ELAST 4 BULKY (GAUZE/BANDAGES/DRESSINGS) ×3 IMPLANT
CANISTER SUCT 3000ML PPV (MISCELLANEOUS) ×3 IMPLANT
COVER SURGICAL LIGHT HANDLE (MISCELLANEOUS) ×6 IMPLANT
DRAIN CHANNEL 19F RND (DRAIN) ×3 IMPLANT
DRSG OPSITE POSTOP 4X10 (GAUZE/BANDAGES/DRESSINGS) IMPLANT
DRSG OPSITE POSTOP 4X8 (GAUZE/BANDAGES/DRESSINGS) IMPLANT
DRSG PAD ABDOMINAL 8X10 ST (GAUZE/BANDAGES/DRESSINGS) ×3 IMPLANT
DRSG TEGADERM 2-3/8X2-3/4 SM (GAUZE/BANDAGES/DRESSINGS) ×3 IMPLANT
ELECT CAUTERY BLADE 6.4 (BLADE) ×6 IMPLANT
ELECT REM PT RETURN 9FT ADLT (ELECTROSURGICAL) ×3
ELECTRODE REM PT RTRN 9FT ADLT (ELECTROSURGICAL) ×1 IMPLANT
EVACUATOR SILICONE 100CC (DRAIN) ×3 IMPLANT
GAUZE SPONGE 4X4 12PLY STRL (GAUZE/BANDAGES/DRESSINGS) ×3 IMPLANT
GLOVE BIO SURGEON STRL SZ7 (GLOVE) ×6 IMPLANT
GLOVE BIOGEL PI IND STRL 7.5 (GLOVE) ×2 IMPLANT
GLOVE BIOGEL PI INDICATOR 7.5 (GLOVE) ×4
GLOVE SURG SS PI 7.0 STRL IVOR (GLOVE) ×3 IMPLANT
GOWN STRL REUS W/ TWL LRG LVL3 (GOWN DISPOSABLE) ×6 IMPLANT
GOWN STRL REUS W/TWL LRG LVL3 (GOWN DISPOSABLE) ×12
KIT TURNOVER KIT B (KITS) ×3 IMPLANT
LIGASURE IMPACT 36 18CM CVD LR (INSTRUMENTS) ×3 IMPLANT
NS IRRIG 1000ML POUR BTL (IV SOLUTION) ×18 IMPLANT
PACK COLON (CUSTOM PROCEDURE TRAY) ×3 IMPLANT
PAD ARMBOARD 7.5X6 YLW CONV (MISCELLANEOUS) ×3 IMPLANT
PENCIL BUTTON HOLSTER BLD 10FT (ELECTRODE) ×3 IMPLANT
PENCIL SMOKE EVACUATOR (MISCELLANEOUS) ×3 IMPLANT
RELOAD PROXIMATE 75MM BLUE (ENDOMECHANICALS) ×3 IMPLANT
SLEEVE SUCTION 125 (MISCELLANEOUS) ×3 IMPLANT
SLEEVE SUCTION CATH 165 (SLEEVE) ×3 IMPLANT
SPONGE LAP 18X18 X RAY DECT (DISPOSABLE) ×12 IMPLANT
STAPLER CUT CVD 40MM BLUE (STAPLE) ×3 IMPLANT
STAPLER PROXIMATE 75MM BLUE (STAPLE) ×3 IMPLANT
STAPLER VISISTAT 35W (STAPLE) ×3 IMPLANT
SURGILUBE 2OZ TUBE FLIPTOP (MISCELLANEOUS) IMPLANT
SUT ETHILON 2 0 FS 18 (SUTURE) ×3 IMPLANT
SUT PDS AB 1 TP1 96 (SUTURE) ×6 IMPLANT
SUT PROLENE 2 0 CT2 30 (SUTURE) IMPLANT
SUT PROLENE 2 0 KS (SUTURE) IMPLANT
SUT PROLENE 2 0 SH DA (SUTURE) ×3 IMPLANT
SUT SILK 2 0 SH CR/8 (SUTURE) ×3 IMPLANT
SUT SILK 2 0 TIES 10X30 (SUTURE) ×3 IMPLANT
SUT SILK 3 0 SH CR/8 (SUTURE) ×3 IMPLANT
SUT SILK 3 0 TIES 10X30 (SUTURE) ×3 IMPLANT
SUT VIC AB 3-0 SH 18 (SUTURE) IMPLANT
TRAY FOLEY MTR SLVR 14FR STAT (SET/KITS/TRAYS/PACK) IMPLANT
TRAY PROCTOSCOPIC FIBER OPTIC (SET/KITS/TRAYS/PACK) IMPLANT
TUBE CONNECTING 12'X1/4 (SUCTIONS) ×2
TUBE CONNECTING 12X1/4 (SUCTIONS) ×4 IMPLANT

## 2018-07-25 NOTE — Anesthesia Preprocedure Evaluation (Signed)
Anesthesia Evaluation  Patient identified by MRN, date of birth, ID band Patient awake    Reviewed: Allergy & Precautions, NPO status , Patient's Chart, lab work & pertinent test results  Airway Mallampati: II  TM Distance: >3 FB Neck ROM: Full    Dental  (+) Dental Advisory Given   Pulmonary former smoker,    breath sounds clear to auscultation       Cardiovascular hypertension, Pt. on medications + CAD, + Cardiac Stents and + Peripheral Vascular Disease  + dysrhythmias  Rhythm:Regular Rate:Normal     Neuro/Psych TIA   GI/Hepatic Neg liver ROS, Perforated bowel   Endo/Other  diabetes, Type 2, Oral Hypoglycemic Agents  Renal/GU Renal Insufficiency and ARFRenal disease     Musculoskeletal  (+) Arthritis ,   Abdominal   Peds  Hematology  (+) anemia ,   Anesthesia Other Findings   Reproductive/Obstetrics                             Lab Results  Component Value Date   WBC 5.5 07/25/2018   HGB 10.9 (L) 07/25/2018   HCT 34.1 (L) 07/25/2018   MCV 86.1 07/25/2018   PLT 158 07/25/2018   Lab Results  Component Value Date   CREATININE 1.49 (H) 07/25/2018   BUN 54 (H) 07/25/2018   NA 130 (L) 07/25/2018   K 4.9 07/25/2018   CL 104 07/25/2018   CO2 16 (L) 07/25/2018    Anesthesia Physical Anesthesia Plan  ASA: III  Anesthesia Plan: General   Post-op Pain Management:    Induction: Intravenous and Rapid sequence  PONV Risk Score and Plan: 3 and Ondansetron, Dexamethasone and Treatment may vary due to age or medical condition  Airway Management Planned: Oral ETT  Additional Equipment:   Intra-op Plan:   Post-operative Plan: Extubation in OR and Possible Post-op intubation/ventilation  Informed Consent: I have reviewed the patients History and Physical, chart, labs and discussed the procedure including the risks, benefits and alternatives for the proposed anesthesia with the  patient or authorized representative who has indicated his/her understanding and acceptance.   Dental advisory given  Plan Discussed with: CRNA  Anesthesia Plan Comments:         Anesthesia Quick Evaluation

## 2018-07-25 NOTE — Progress Notes (Signed)
Patient examined and interviewed. Please refer to full consult note. Despite moderately increased risk for perioperative cardiac complications (primarily due to emergency nature of abdominal surgery and age), the surgical procedure is clearly necessary and should not be delayed. Will plan to start IV heparin anticoagulation as soon as safe per surgical team. Restart dofetilide when she is able to take PO meds. Would not be a big surprise if she has perioperative atrial fibrillation, but should be managed with rate control meds if it occurs. Will follow along  Sanda Klein, MD, [facility] [phone] office [phone] pager

## 2018-07-25 NOTE — Anesthesia Postprocedure Evaluation (Signed)
Anesthesia Post Note  Patient: Melody Casey  Procedure(s) Performed: COLOSTOMY (N/A Abdomen) COLECTOMY (N/A Abdomen) EXPLORATORY LAPAROTOMY (N/A Abdomen)     Patient location during evaluation: ICU Anesthesia Type: General Level of consciousness: sedated Pain management: pain level controlled Vital Signs Assessment: post-procedure vital signs reviewed and stable Respiratory status: patient remains intubated per anesthesia plan Cardiovascular status: stable Postop Assessment: no apparent nausea or vomiting Anesthetic complications: no    Last Vitals:  Vitals:   07/25/18 1536 07/25/18 1621  BP:    Pulse:  (!) 119  Resp:  (!) 28  Temp: 36.6 C   SpO2:  97%    Last Pain:  Vitals:   07/25/18 1536  TempSrc: Rectal  PainSc:                  Melody Casey

## 2018-07-25 NOTE — Progress Notes (Signed)
PCCM INTERVAL PROGRESS NOTE  Called back to bedside by RN for AF RVR and persistent (albeit improving) acidosis on ABG.   AF RVR with rates in 140s. Also Hypertensive. Suspect pain is a driving factor here. Gave 70mg fentanyl and HR, BP improved. Will order fentanyl infusion. Cardiology was expecting she may go into AF after surgery. Recommended rate control medications. PRN metoprolol ordered.   Acidosis: Mixed respiratory and NAG metabolic. 7.25 PH on ABG. Vent rate increased to 26. Will also start sodium bicarb infusion. Suspect met acidosis secondary to GI losses.  Hopefully will only need Bicarb for short duration. ABG ordered for 1800.    PGeorgann Housekeeper AGACNP-BC LHeart [facility] LL[facility]ulmonology/Critical Care Pager [phone] 07/25/2018 4:26 PM

## 2018-07-25 NOTE — Consult Note (Signed)
[place] Nurse ostomy consult note Patient just arrived to [facility] 2M05.  No family present. Surgical two piece pouching system in place (2 and 3/4 inch barrier and pouch).  Left sided fecal stoma red, moist.   A-11 sent to obtain needed ostomy supplies. F/U by [facility] nurses will occur beginning next week.  Patient intubated, sedated. Val Riles, RN, MSN, CWOCN, CNS-BC, pager [phone]

## 2018-07-25 NOTE — Progress Notes (Signed)
PROGRESS NOTE        PATIENT DETAILS Name: Melody Casey Age: 72 y.o. Sex: female Date of Birth: [birth year] Admit Date: 07/24/2018 Admitting Physician No admitting provider for patient encounter. [id number], [name], MD  Brief Narrative: Patient is a 72 y.o. female with history of CAD status post PCI in 2012, atrial fibrillation on Coumadin, breast cancer on anastrozole presented with lower abdominal pain, she was found to have sepsis due to perforated sigmoid diverticulitis.  She was initially evaluated at [facility], but due to numerous medical comorbidities, she was transferred to [facility] for further surgical evaluation.  See below for further details  Subjective: Lying comfortably in bed but has significant pain in the lower abdomen mostly in the left lower quadrant.  Assessment/Plan: Sepsis secondary to perforated sigmoid diverticulitis: Although hemodynamically stable, she appears to have peritonitis on exam.  Have spoken with Dr. Donne Hazel, for laparotomy later today.  Agree with surgery regarding the need to pursue laparotomy fairly urgently due to peritonitis.  Suspect given her overall clinical situation she is as optimized as one could be.  Will need to be monitored closely in a telemetry unit postoperatively.  Continue supportive care with IV fluids, IV Zosyn and n.p.o. status.  Blood culture on 8/15- so far.  Acute kidney injury: Hemodynamically mediated in the setting of sepsis/peritonitis, with supportive care.  Continue to follow electrolytes s closely.  Hyponatremia: Suspect secondary to dehydration-as responding with IV fluids.  Follow electrolytes.  Doubt further work-up is required as mild and clearly secondary to dehydration in the setting of sepsis.  PAF: Coagulation reversed with Kcentra due to need for fairly urgent laparotomy.  When able will resume anticoagulation and Tikosyn.  Monitor closely in telemetry, if needed we  will start rate control agents if patient goes into RVR in the immediate postoperative.  CAD status post PCI in 2012: Currently without any anginal symptoms.  Have consulted cardiology for preop clearance but patient needs laparotomy fairly urgently due to peritonitis.  Will need to be monitored closely in the postoperative setting, preferably in a telemetry unit.  DM-2: CBG stable with SSI, resume oral hypoglycemic agents when able.  Hypertension: Currently stable-all blood pressure medications on hold due to sepsis/n.p.o. status.  Resume when able.  7 mm pulmonary nodule in the left lower lobe: Needs outpatient repeat CT in 6 to 12 months.  ER positive breast cancer: Resume anastrozole when able.  She has a history of lumpectomy in 2015.  DVT Prophylaxis: SCD's  Code Status: Full code   Family Communication: None at bedside  Disposition Plan: Remain inpatient-will require several days of hospitalization before discharge.  Antimicrobial agents: Anti-infectives (From admission, onward)   Start     Dose/Rate Route Frequency Ordered Stop   07/24/18 1800  [MAR Hold]  piperacillin-tazobactam (ZOSYN) IVPB 3.375 g     (MAR Hold since Fri 07/25/2018 at 0950. Reason: Transfer to a Procedural area.)   3.375 g 12.5 mL/hr over 240 Minutes Intravenous Every 8 hours 07/24/18 1725     07/24/18 1045  cefTRIAXone (ROCEPHIN) 2 g in sodium chloride 0.9 % 100 mL IVPB  Status:  Discontinued     2 g 200 mL/hr over 30 Minutes Intravenous Every 24 hours 07/24/18 1044 07/24/18 1722   07/24/18 1045  metroNIDAZOLE (FLAGYL) IVPB 500 mg  Status:  Discontinued  500 mg 100 mL/hr over 60 Minutes Intravenous Every 8 hours 07/24/18 1044 07/24/18 1722      Procedures: None  CONSULTS:  cardiology and general surgery  Time spent: 35 minutes-Greater than 50% of this time was spent in counseling, explanation of diagnosis, planning of further management, and coordination of care.  MEDICATIONS: Scheduled  Meds: . [MAR Hold] insulin aspart  0-9 Units Subcutaneous Q4H   Continuous Infusions: . sodium chloride 125 mL/hr at 07/25/18 0236  . lactated ringers 10 mL/hr at 07/25/18 0956  . [MAR Hold] piperacillin-tazobactam (ZOSYN)  IV 3.375 g (07/25/18 0927)   PRN Meds:.[MAR Hold] acetaminophen **OR** [MAR Hold] acetaminophen, [MAR Hold]  morphine injection, [MAR Hold] ondansetron **OR** [MAR Hold] ondansetron (ZOFRAN) IV   PHYSICAL EXAM: Vital signs: Vitals:   07/25/18 0103 07/25/18 0156 07/25/18 0403 07/25/18 0730  BP: (!) 108/53 (!) 101/50 (!) 118/56 (!) 94/46  Pulse: (!) 104  (!) 101   Resp: 17 14 14  (!) 21  Temp: 98.1 F (36.7 C) 98.8 F (37.1 C) 98.1 F (36.7 C) 98.3 F (36.8 C)  TempSrc: Oral Oral Oral Oral  SpO2: 95% 95% 93% 93%  Weight:      Height:       Filed Weights   07/24/18 0915  Weight: 72.6 kg   Body mass index is 24.33 kg/m.   General appearance :Awake, alert, not in any distress. Eyes:Pink conjunctiva HEENT: Atraumatic and Normocephalic Neck: supple Resp:Good air entry bilaterally, no added sounds  CVS: S1 S2 regular GI: Bowel sounds present, significantly tender with rebound tenderness mostly in the lower abdomen. Extremities: B/L Lower Ext shows no edema, both legs are warm to touch Neurology:  speech clear,Non focal, sensation is grossly intact. Psychiatric: Normal judgment and insight. Alert and oriented x 3. Normal mood. Musculoskeletal:No digital cyanosis Skin:No Rash, warm and dry Wounds:N/A  I have personally reviewed following labs and imaging studies  LABORATORY DATA: CBC: Recent Labs  Lab 07/22/18 1632 07/24/18 0944 07/25/18 0349  WBC 15.1* 5.2 5.5  NEUTROABS 12.6* 4.2  --   HGB 13.3 12.8 10.9*  HCT 40.4 37.0 34.1*  MCV 87 82.4 86.1  PLT 204 193 462    Basic Metabolic Panel: Recent Labs  Lab 07/22/18 1632 07/24/18 0944 07/25/18 0349  NA 132* 124* 130*  K 4.6 4.6 4.9  CL 93* 94* 104  CO2 18* 16* 16*  GLUCOSE 134* 152* 86   BUN 32* 58* 54*  CREATININE 1.43* 2.18* 1.49*  CALCIUM 10.4* 9.5 8.7*    GFR: Estimated Creatinine Clearance: 33.4 mL/min (A) (by C-G formula based on SCr of 1.49 mg/dL (H)).  Liver Function Tests: Recent Labs  Lab 07/22/18 1632 07/24/18 0944 07/25/18 0349  AST 14 20 26   ALT 10 15 19   ALKPHOS 110 120 98  BILITOT 0.9 1.8* 2.7*  PROT 6.5 6.8 5.4*  ALBUMIN 3.5 2.7* 2.1*   Recent Labs  Lab 07/24/18 0944  LIPASE 24   No results for input(s): AMMONIA in the last 168 hours.  Coagulation Profile: Recent Labs  Lab 07/24/18 0944 07/24/18 1856 07/25/18 0349  INR 3.01 1.26 1.23    Cardiac Enzymes: No results for input(s): CKTOTAL, CKMB, CKMBINDEX, TROPONINI in the last 168 hours.  BNP (last 3 results) No results for input(s): PROBNP in the last 8760 hours.  HbA1C: No results for input(s): HGBA1C in the last 72 hours.  CBG: Recent Labs  Lab 07/24/18 1804 07/24/18 2014 07/25/18 0006 07/25/18 0400 07/25/18 0824  GLUCAP 111* 83  105* 87 78    Lipid Profile: No results for input(s): CHOL, HDL, LDLCALC, TRIG, CHOLHDL, LDLDIRECT in the last 72 hours.  Thyroid Function Tests: No results for input(s): TSH, T4TOTAL, FREET4, T3FREE, THYROIDAB in the last 72 hours.  Anemia Panel: No results for input(s): VITAMINB12, FOLATE, FERRITIN, TIBC, IRON, RETICCTPCT in the last 72 hours.  Urine analysis:    Component Value Date/Time   COLORURINE YELLOW 09/01/2015 1800   APPEARANCEUR Cloudy (A) 06/30/2018 1345   LABSPEC 1.025 09/01/2015 1800   PHURINE 6.0 09/01/2015 1800   GLUCOSEU Negative 06/30/2018 1345   GLUCOSEU NEGATIVE 04/05/2008 0833   HGBUR SMALL (A) 09/01/2015 1800   BILIRUBINUR Negative 06/30/2018 1345   KETONESUR NEGATIVE 09/01/2015 1800   PROTEINUR Negative 06/30/2018 1345   PROTEINUR TRACE (A) 09/01/2015 1800   UROBILINOGEN negative 10/25/2015 0926   UROBILINOGEN 0.2 09/01/2015 1800   NITRITE Positive (A) 06/30/2018 1345   NITRITE NEGATIVE 09/01/2015  1800   LEUKOCYTESUR 1+ (A) 06/30/2018 1345    Sepsis Labs: Lactic Acid, Venous    Component Value Date/Time   LATICACIDVEN 2.2 (HH) 07/24/2018 1313    MICROBIOLOGY: Recent Results (from the past 240 hour(s))  Blood Culture (routine x 2)     Status: None (Preliminary result)   Collection Time: 07/24/18 11:07 AM  Result Value Ref Range Status   Specimen Description BLOOD LEFT ARM  Final   Special Requests   Final    BOTTLES DRAWN AEROBIC AND ANAEROBIC Blood Culture adequate volume Performed at [facility], [address], [place], [place] [address]    Culture PENDING  Incomplete   Report Status PENDING  Incomplete  Blood Culture (routine x 2)     Status: None (Preliminary result)   Collection Time: 07/24/18 11:07 AM  Result Value Ref Range Status   Specimen Description BLOOD LEFT HAND  Final   Special Requests   Final    BOTTLES DRAWN AEROBIC AND ANAEROBIC Blood Culture adequate volume Performed at [facility], [address], [place], [place] [address]    Culture PENDING  Incomplete   Report Status PENDING  Incomplete  Surgical pcr screen     Status: None   Collection Time: 07/24/18  8:19 PM  Result Value Ref Range Status   MRSA, PCR NEGATIVE NEGATIVE Final   Staphylococcus aureus NEGATIVE NEGATIVE Final    Comment: (NOTE) The Xpert SA Assay (FDA approved for NASAL specimens in patients 17 years of age and older), is one component of a comprehensive surveillance program. It is not intended to diagnose infection nor to guide or monitor treatment. Performed at [facility], [place] [address], [place], [place] [address]     RADIOLOGY STUDIES/RESULTS: Ct Abdomen Pelvis Wo Contrast  Result Date: 07/24/2018 CLINICAL DATA:  Abdominal pain.  History of diverticulitis. EXAM: CT ABDOMEN AND PELVIS WITHOUT CONTRAST TECHNIQUE: Multidetector CT imaging of the abdomen and pelvis was performed following the standard protocol without IV contrast. COMPARISON:  CT abdomen pelvis  dated Apr 10, 2016. FINDINGS: Lower chest: No acute abnormality. New 9 x 5 mm pulmonary nodule in the left lower lobe (series 4, image 13). Hepatobiliary: Hepatic steatosis. No focal liver abnormality. Small layering gallstones. No gallbladder wall thickening or biliary dilatation. Pancreas: Unremarkable. No pancreatic ductal dilatation or surrounding inflammatory changes. Spleen: Normal in size without focal abnormality. Adrenals/Urinary Tract: The adrenal glands are unremarkable. Unchanged bilateral renal simple cysts. Bilateral hilar renal vascular calcifications. Punctate nonobstructive calculi in both kidneys. Unchanged chronic mild right hydronephrosis with  decompressed right ureter. The bladder is unremarkable. Stomach/Bowel: Moderate colonic diverticulosis. Large amount of stool within the sigmoid colon with moderate wall thickening and prominent surrounding inflammatory changes. Focal disruption of the anterior sigmoid colonic wall (series 2, image 62 with adjacent 2.0 x 6.8 x 5.4 cm gas and stool containing collection. The colonic wall defect measures approximately 1.6 cm. The stomach and small bowel are unremarkable.  No obstruction. Vascular/Lymphatic: Interval enlargement of the infrarenal abdominal aortic aneurysm, now measuring 3.8 cm, previously 3.3 cm. Aortoiliac atherosclerosis. Multiple reactive mesenteric lymph nodes in the pelvis. No enlarged abdominal or pelvic lymph nodes. Reproductive: Multiple calcified uterine fibroids.  No adnexal mass. Other: Small amount of free fluid in the mesentery and pelvis. Multiple small foci of pneumoperitoneum near the site of sigmoid colonic perforation and in the mesentery. Musculoskeletal: No acute or significant osseous findings. IMPRESSION: 1. Large amount of stool within the sigmoid colon with moderate wall thickening and prominent surrounding inflammatory changes with focal perforation of the anterior sigmoid colonic wall with adjacent 2.0 x 6.8 x 5.4 cm  gas and stool containing collection. Additional small foci of pneumoperitoneum and free fluid near the site of perforation and within the mesentery. 2. Etiology of underlying sigmoid colonic inflammation could be related to stercoral colitis or diverticulitis. 3. New 7 mm aggregate pulmonary nodule in the left lower lobe. Non-contrast chest CT at 6-12 months is recommended. If the nodule is stable at time of repeat CT, then future CT at 18-24 months (from today's scan) is considered optional for low-risk patients, but is recommended for high-risk patients. This recommendation follows the consensus statement: Guidelines for Management of Incidental Pulmonary Nodules Detected on CT Images: From the Fleischner Society 2017; Radiology 2017; 284:228-243. 4. Enlargement of the infrarenal abdominal aortic aneurysm, now measuring 3.8 cm. Recommend followup by ultrasound in 2 years. This recommendation follows ACR consensus guidelines: White Paper of the ACR Incidental Findings Committee II on Vascular Findings. J Am Coll Radiol 2013; 10:789-794. 5.  Aortic atherosclerosis (ICD10-I70.0). 6. Hepatic steatosis. 7. Cholelithiasis. 8. Bilateral nonobstructive nephrolithiasis. 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 Critical Value/emergent results were called by telephone at the time of interpretation on 07/24/2018 at 11:36 am to Dr. Noemi Chapel, who verbally acknowledged these results. Electronically Signed   By: Titus Dubin M.D.   On: 07/24/2018 11:44   Dg Chest Portable 1 View  Result Date: 07/24/2018 CLINICAL DATA:  History of breast carcinoma.  Upper abdominal pain EXAM: PORTABLE CHEST 1 VIEW COMPARISON:  June 30, 2015 FINDINGS: Upright image obtained. Lungs are clear. Heart is upper normal in size with pulmonary vascularity normal. No adenopathy. There is aortic atherosclerosis. No evident bone lesions. No pneumoperitoneum is demonstrated on this study. IMPRESSION: No edema or  consolidation. No demonstrable pneumoperitoneum. There is aortic atherosclerosis. Aortic Atherosclerosis (ICD10-I70.0). Electronically Signed   By: Lowella Grip III M.D.   On: 07/24/2018 09:57     LOS: 1 day   Oren Binet, MD  [facility]  If 7PM-7AM, please contact night-coverage  Please page via [email or web address] [email or web address]-click on MD name and type text message  07/25/2018, 11:02 AM

## 2018-07-25 NOTE — Progress Notes (Signed)
Subjective/Chief Complaint: Doesn't move because it hurts, no n/v   Objective: Vital signs in last 24 hours: Temp:  [97.9 F (36.6 C)-99.3 F (37.4 C)] 98.3 F (36.8 C) (08/16 0730) Pulse Rate:  [101-114] 101 (08/16 0403) Resp:  [14-23] 21 (08/16 0730) BP: (82-121)/(46-66) 94/46 (08/16 0730) SpO2:  [86 %-97 %] 93 % (08/16 0730) Weight:  [72.6 kg] 72.6 kg (08/15 0915) Last BM Date: 07/24/18  Intake/Output from previous day: 08/15 0701 - 08/16 0700 In: 6172.5 [I.V.:977.5; Blood:790; IV [id number]:7371] Out: 200 [Urine:200] Intake/Output this shift: No intake/output data recorded.  GI: tender diffusely with localized peritoneal signs, nondistended  Lab Results:  Recent Labs    07/24/18 0944 07/25/18 0349  WBC 5.2 5.5  HGB 12.8 10.9*  HCT 37.0 34.1*  PLT 193 158   BMET Recent Labs    07/24/18 0944 07/25/18 0349  NA 124* 130*  K 4.6 4.9  CL 94* 104  CO2 16* 16*  GLUCOSE 152* 86  BUN 58* 54*  CREATININE 2.18* 1.49*  CALCIUM 9.5 8.7*   PT/INR Recent Labs    07/24/18 1856 07/25/18 0349  LABPROT 15.7* 15.4*  INR 1.26 1.23   ABG No results for input(s): PHART, HCO3 in the last 72 hours.  Invalid input(s): PCO2, PO2  Studies/Results: Ct Abdomen Pelvis Wo Contrast  Result Date: 07/24/2018 CLINICAL DATA:  Abdominal pain.  History of diverticulitis. EXAM: CT ABDOMEN AND PELVIS WITHOUT CONTRAST TECHNIQUE: Multidetector CT imaging of the abdomen and pelvis was performed following the standard protocol without IV contrast. COMPARISON:  CT abdomen pelvis dated Apr 10, 2016. FINDINGS: Lower chest: No acute abnormality. New 9 x 5 mm pulmonary nodule in the left lower lobe (series 4, image 13). Hepatobiliary: Hepatic steatosis. No focal liver abnormality. Small layering gallstones. No gallbladder wall thickening or biliary dilatation. Pancreas: Unremarkable. No pancreatic ductal dilatation or surrounding inflammatory changes. Spleen: Normal in size without focal  abnormality. Adrenals/Urinary Tract: The adrenal glands are unremarkable. Unchanged bilateral renal simple cysts. Bilateral hilar renal vascular calcifications. Punctate nonobstructive calculi in both kidneys. Unchanged chronic mild right hydronephrosis with decompressed right ureter. The bladder is unremarkable. Stomach/Bowel: Moderate colonic diverticulosis. Large amount of stool within the sigmoid colon with moderate wall thickening and prominent surrounding inflammatory changes. Focal disruption of the anterior sigmoid colonic wall (series 2, image 62 with adjacent 2.0 x 6.8 x 5.4 cm gas and stool containing collection. The colonic wall defect measures approximately 1.6 cm. The stomach and small bowel are unremarkable.  No obstruction. Vascular/Lymphatic: Interval enlargement of the infrarenal abdominal aortic aneurysm, now measuring 3.8 cm, previously 3.3 cm. Aortoiliac atherosclerosis. Multiple reactive mesenteric lymph nodes in the pelvis. No enlarged abdominal or pelvic lymph nodes. Reproductive: Multiple calcified uterine fibroids.  No adnexal mass. Other: Small amount of free fluid in the mesentery and pelvis. Multiple small foci of pneumoperitoneum near the site of sigmoid colonic perforation and in the mesentery. Musculoskeletal: No acute or significant osseous findings. IMPRESSION: 1. Large amount of stool within the sigmoid colon with moderate wall thickening and prominent surrounding inflammatory changes with focal perforation of the anterior sigmoid colonic wall with adjacent 2.0 x 6.8 x 5.4 cm gas and stool containing collection. Additional small foci of pneumoperitoneum and free fluid near the site of perforation and within the mesentery. 2. Etiology of underlying sigmoid colonic inflammation could be related to stercoral colitis or diverticulitis. 3. New 7 mm aggregate pulmonary nodule in the left lower lobe. Non-contrast chest CT at 6-12 months is recommended.  If the nodule is stable at time of  repeat CT, then future CT at 18-24 months (from today's scan) is considered optional for low-risk patients, but is recommended for high-risk patients. This recommendation follows the consensus statement: Guidelines for Management of Incidental Pulmonary Nodules Detected on CT Images: From the Fleischner Society 2017; Radiology 2017; 284:228-243. 4. Enlargement of the infrarenal abdominal aortic aneurysm, now measuring 3.8 cm. Recommend followup by ultrasound in 2 years. This recommendation follows ACR consensus guidelines: White Paper of the ACR Incidental Findings Committee II on Vascular Findings. J Am Coll Radiol 2013; 10:789-794. 5.  Aortic atherosclerosis (ICD10-I70.0). 6. Hepatic steatosis. 7. Cholelithiasis. 8. Bilateral nonobstructive nephrolithiasis. 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 Critical Value/emergent results were called by telephone at the time of interpretation on 07/24/2018 at 11:36 am to Dr. Noemi Chapel, who verbally acknowledged these results. Electronically Signed   By: Titus Dubin M.D.   On: 07/24/2018 11:44   Dg Chest Portable 1 View  Result Date: 07/24/2018 CLINICAL DATA:  History of breast carcinoma.  Upper abdominal pain EXAM: PORTABLE CHEST 1 VIEW COMPARISON:  June 30, 2015 FINDINGS: Upright image obtained. Lungs are clear. Heart is upper normal in size with pulmonary vascularity normal. No adenopathy. There is aortic atherosclerosis. No evident bone lesions. No pneumoperitoneum is demonstrated on this study. IMPRESSION: No edema or consolidation. No demonstrable pneumoperitoneum. There is aortic atherosclerosis. Aortic Atherosclerosis (ICD10-I70.0). Electronically Signed   By: Lowella Grip III M.D.   On: 07/24/2018 09:57    Anti-infectives: Anti-infectives (From admission, onward)   Start     Dose/Rate Route Frequency Ordered Stop   07/24/18 1800  piperacillin-tazobactam (ZOSYN) IVPB 3.375 g     3.375 g 12.5 mL/hr over 240  Minutes Intravenous Every 8 hours 07/24/18 1725     07/24/18 1045  cefTRIAXone (ROCEPHIN) 2 g in sodium chloride 0.9 % 100 mL IVPB  Status:  Discontinued     2 g 200 mL/hr over 30 Minutes Intravenous Every 24 hours 07/24/18 1044 07/24/18 1722   07/24/18 1045  metroNIDAZOLE (FLAGYL) IVPB 500 mg  Status:  Discontinued     500 mg 100 mL/hr over 60 Minutes Intravenous Every 8 hours 07/24/18 1044 07/24/18 1722      Assessment/Plan: Perforated sigmoid colon, ? Diverticular disease  INR normal, has surgical abdomen, needs to go to or this am.  Discussed sigmoid colectomy and colostomy with her.  She is calling family to come in Risks are numerous but no other choice. Include but not limited to bleeding, infection, reoperation, cardiac and pulm risks, long term mech vent, stroke, mi and death She understands and will proceed this am  Rolm Bookbinder 07/25/2018

## 2018-07-25 NOTE — Anesthesia Procedure Notes (Signed)
Procedure Name: Intubation Date/Time: 07/25/2018 10:38 AM Performed by: Renato Shin, CRNA Pre-anesthesia Checklist: Patient identified, Emergency Drugs available, Suction available and Patient being monitored Patient Re-evaluated:Patient Re-evaluated prior to induction Oxygen Delivery Method: Circle system utilized Preoxygenation: Pre-oxygenation with 100% oxygen Induction Type: IV induction and Rapid sequence Laryngoscope Size: Miller and 2 Grade View: Grade I Tube type: Oral Tube size: 7.0 mm Number of attempts: 1 Airway Equipment and Method: Stylet Placement Confirmation: ETT inserted through vocal cords under direct vision,  positive ETCO2,  CO2 detector and breath sounds checked- equal and bilateral Secured at: 21 cm Tube secured with: Tape Dental Injury: Teeth and Oropharynx as per pre-operative assessment

## 2018-07-25 NOTE — Anesthesia Procedure Notes (Signed)
Central Venous Catheter Insertion Performed by: Murvin Natal, MD, anesthesiologist Start/End8/16/2019 12:30 PM, 07/25/2018 12:40 PM Patient location: Pre-op. Emergency situation Preanesthetic checklist: patient identified, IV checked, site marked, risks and benefits discussed, surgical consent, monitors and equipment checked, pre-op evaluation, timeout performed and anesthesia consent Position: Trendelenburg Patient sedated Hand hygiene performed , maximum sterile barriers used  and Seldinger technique used Catheter size: 8 Fr Total catheter length 16. Central line was placed.Double lumen Procedure performed using ultrasound guided technique. Ultrasound Notes:image(s) printed for medical record Attempts: 1 Following insertion, dressing applied, line sutured and Biopatch. Post procedure assessment: blood return through all ports, free fluid flow and no air  Patient tolerated the procedure well with no immediate complications.

## 2018-07-25 NOTE — Op Note (Signed)
Preoperative diagnosis: Perforated sigmoid colon Postoperative diagnosis: Sigmoid perforation likely due to stercoral ulcer Procedure: 1.  Left colectomy 2.  End transverse colostomy Surgeon: Dr. Serita Grammes Assistant: Saverio Danker, PA Anesthesia: General Estimated blood loss: 50 cc Specimens: 1.  Sigmoid colon with stitch marking proximal 2.  Left colon with stitch marking proximal Complications: None Drains: 19 French Blake drain the pelvis Special count was correct at completion Disposition to recovery stable  Indications: This is a 72 year old female with multiple comorbidities who presented with abdominal pain.  She appeared to be septic upon presentation.  She responded to some fluids though.  She was seen by me this morning after being placed on antibiotics and clearly needed to have surgery urgently.  I discussed going to the operating room for sigmoid colectomy, colostomy.  We discussed the procedure, recovery, and the risks.  I discussed this with her husband prior to beginning as well.  Procedure: After informed consent was obtained the patient was taken to the operating room.  She was given antibiotics.  SCDs were in place.  She was placed under general anesthesia without complication.  An orogastric tube and Foley were placed.  She eventually had an arterial line and a central venous line placed as well.  She was prepped and draped in the standard sterile surgical fashion.  Surgical timeout was then performed.  I made a midline incision.  I entered into the peritoneum and there was immediately was a rush of a large volume of stool.  I was able to open the entire incision.  It became clear that she was leaking a lot of stool from her sigmoid colon.  I was able to insert a retractor.  I then released the white line of [place] on the left side.  I divided the sigmoid colon where it appeared to be proximal to the sigmoid colon with the perforation.  I then used a combination of the  LigaSure device and Kelly clamps with silk ligatures to divide the mesentery down to the lower portion of the sigmoid and upper portion of the rectum.  I then divided this at a healthy place what appeared to be near the rectosigmoid junction.  This was very close to the sacral promontory.  I then passed this off the table.  I then irrigated copiously.  There was a large volume of contaminations in all 4 quadrants.  I ran the small bowel.  This had a lot of contamination and irritation but it was all viable.  I also reviewed the remainder of the colon.  The remainder the left colon was still somewhat concerning.  I elected to mobilize the splenic flexure and remove the remainder of the left colon.  I then looked at the right side of the colon several times and there was no evidence of any perforation and this appeared healthy.  I then irrigated more.  I then made a hole in the abdominal skin in the left upper quadrant.  I dissected down and identified the rectus fascia incised this in a cruciate fashion.  I divided the muscle and into the peritoneum.  I then pulled the transverse colon through that.  I think this was done easily without any tension.  This was viable.  I then secured it internally with 3-0 Vicryl to the fascia.  I then placed the omentum overlying the viscera.  I then closed the abdomen with #1 looped PDS.  I's wet-to-dry dressing was placed.  We then milked a fair amount  of the hard stool out of the stoma.  This was then matured with 3-0 Vicryl in an appliance place.  I did also place a 54 [place] Blake drain in the pelvis and secured this with a 2-0 nylon suture.  She will be transferred to the ICU in critical condition.

## 2018-07-25 NOTE — Consult Note (Signed)
PULMONARY / CRITICAL CARE MEDICINE   Name: Melody Casey MRN: [id number] DOB: 1946-10-15    ADMISSION DATE:  07/24/2018 CONSULTATION DATE:  8/16  REFERRING MD:  Dr. Sloan Leiter  CHIEF COMPLAINT:  Bowel perforation  HISTORY OF PRESENT ILLNESS:  Patient is encephalopathic and/or intubated. Therefore history has been obtained from chart review. 72 year old female with PMH as below, which is significant for diverticulitis, atrial fibrillation on warfarin and Tikosyn, coronary artery disease status post stenting, diabetes, breast cancer, and hypertension.  She presented to [facility] emergency department on 8/15 with complaints of recent constipation and more recent left lower quadrant pain.  This pain began to radiate to the periumbilical area which is what caused her to present.  CT scan of the abdomen in the emergency department demonstrated a focal perforation of the anterior sigmoid wall of the colon and pneumoperitoneum.  She was admitted for stabilization and reversal of warfarin prior to being taken to the operating room on 8/16 where she underwent colectomy with colostomy placement.  Operative course complicated by shock and acidosis.  Was started on pressors and was postoperatively transferred to the ICU for further monitoring.  PAST MEDICAL HISTORY :  She  has a past medical history of AAA (abdominal aortic aneurysm) ([place]), Acute diverticulitis, Anxiety, Arthritis, Atrial fibrillation ([place]), CAD (coronary artery disease), Cancer ([place]), Diabetes mellitus, Diverticulitis, Diverticulitis of colon ( ), Foot deformity (1947), Hemangioma, HLD (hyperlipidemia), HTN (hypertension), Osteoporosis, Tobacco abuse, Ureteral obstruction, and Wears dentures.  PAST SURGICAL HISTORY: She  has a past surgical history that includes Tonsillectomy and adenoidectomy; Knee arthroscopy; Tubal ligation; Right leg surgery (age 74); Ureteral surgery (2011); Coronary stent placement (2012); Cardiac catheterization (2012);  Breast lumpectomy with needle localization and axillary sentinel lymph node bx (Right, 03/01/2014); Colonoscopy with propofol (N/A, 06/29/2014); Esophagogastroduodenoscopy (egd) with propofol (N/A, 11/01/2015); biopsy (N/A, 11/01/2015); and Breast surgery.  Allergies  Allergen Reactions  . Miralax [Polyethylene Glycol] Swelling and Rash    Took [facility] brand developed rash. Patient states she tolerated name brand MiraLax in the past.  09/01/15. Patient states she had diffuse swelling including swelling of her lips.   . Vancomycin Rash and Shortness Of Breath  . Acetaminophen Hives  . Oxycodone-Acetaminophen Itching  . Sulfa Antibiotics Rash    All over rash  . Sulfacetamide Sodium Rash    All over rash  . Sulfasalazine Rash    All over rash All over rash  . Banana Other (See Comments)    Unknown  . Latex Rash  . Tape Rash    No current facility-administered medications on file prior to encounter.    Current Outpatient Medications on File Prior to Encounter  Medication Sig  . ALPRAZolam (XANAX) 0.25 MG tablet Take 1 tablet (0.25 mg total) by mouth daily as needed for anxiety.  Marland Kitchen amLODipine (NORVASC) 2.5 MG tablet Take 1 tablet (2.5 mg total) by mouth daily. Please call to make appointment for further refills.  Marland Kitchen anastrozole (ARIMIDEX) 1 MG tablet Take 1 tablet (1 mg total) by mouth daily.  Marland Kitchen atorvastatin (LIPITOR) 40 MG tablet Take 1 1/2 tablets daily  . ciprofloxacin (CIPRO) 500 MG tablet Take 1 tablet (500 mg total) by mouth 2 (two) times daily for 10 days.  Marland Kitchen dofetilide (TIKOSYN) 250 MCG capsule Take 1 capsule (250 mcg total) by mouth 2 (two) times daily.  . hydroxychloroquine (PLAQUENIL) 200 MG tablet Take by mouth 2 (two) times daily.  Marland Kitchen KLOR-CON M20 20 MEQ tablet TAKE 1 TABLET BY  MOUTH TWICE DAILY (Patient taking differently: Take 20 mEq by mouth daily. )  . linaclotide (LINZESS) 145 MCG CAPS capsule Take 1 capsule (145 mcg total) by mouth daily before breakfast.  . lisinopril  (PRINIVIL,ZESTRIL) 40 MG tablet TAKE 1 TABLET BY MOUTH IN THE MORNING  . magnesium oxide (MAG-OX) 400 MG tablet Take 1 tablet (400 mg total) by mouth daily. Take 400 mg twice a day with food (Patient taking differently: Take 400 mg by mouth daily. )  . metFORMIN (GLUCOPHAGE) 500 MG tablet TAKE 1 TABLET BY MOUTH ONCE DAILY  . metroNIDAZOLE (FLAGYL) 500 MG tablet Take 1 tablet (500 mg total) by mouth 2 (two) times daily for 10 days.  . nitroGLYCERIN (NITROSTAT) 0.4 MG SL tablet Place 1 tablet (0.4 mg total) under the tongue every 5 (five) minutes as needed for chest pain.  Marland Kitchen warfarin (COUMADIN) 5 MG tablet Take 1 tablet daily except on Thursdays take 1 and 1/2 tablets. (Patient taking differently: Take 5 mg by mouth daily. )  . amoxicillin (AMOXIL) 500 MG capsule Take 1 capsule (500 mg total) by mouth 3 (three) times daily. (Patient not taking: Reported on 07/24/2018)    FAMILY HISTORY:  Her family history includes Cancer in her brother and father; Diabetes in her brother; Heart disease in her mother; Heart failure (age of onset: 78) in her mother; Hypertension in her brother; Lung cancer (age of onset: 55) in her father. There is no history of Colon cancer or Liver disease.  SOCIAL HISTORY: She  reports that she quit smoking about 7 years ago. Her smoking use included cigarettes. She has a 100.00 pack-year smoking history. She has never used smokeless tobacco. She reports that she does not drink alcohol or use drugs.  REVIEW OF SYSTEMS:   unable  SUBJECTIVE:    VITAL SIGNS: BP (!) 143/70   Pulse (!) 101   Temp (!) 97 F (36.1 C) (Esophageal)   Resp 20   Ht 5\' 3"  (1.6 m)   Wt 72.6 kg   SpO2 93%   BMI 28.34 kg/m   HEMODYNAMICS:    VENTILATOR SETTINGS: Vent Mode: PRVC FiO2 (%):  [100 %] 100 % Set Rate:  [20 bmp] 20 bmp Vt Set:  [420 mL] 420 mL PEEP:  [5 cmH20] 5 cmH20 Plateau Pressure:  [13 cmH20] 13 cmH20  INTAKE / OUTPUT: I/O last 3 completed shifts: In: 6172.5 [I.V.:977.5;  Blood:790; IV [id number]:9024] Out: 200 [Urine:200]  PHYSICAL EXAMINATION: General:  Elderly female in NAD on vent Neuro: Sedated. RASS -3 HEENT:  [place]/AT, PERRL, no JVD Cardiovascular:  RRR, no MRG. No edema. Lungs:  Clear bilateral breath sounds Abdomen:  Soft, non-tender, non-distended. New stoma appears beefy red.  Musculoskeletal:  Deformity or R lower extremity, chronic Skin:  Grossly intact.   LABS:  BMET Recent Labs  Lab 07/22/18 1632 07/24/18 0944 07/25/18 0349 07/25/18 1154  NA 132* 124* 130* 135  K 4.6 4.6 4.9 4.3  CL 93* 94* 104  --   CO2 18* 16* 16*  --   BUN 32* 58* 54*  --   CREATININE 1.43* 2.18* 1.49*  --   GLUCOSE 134* 152* 86  --     Electrolytes Recent Labs  Lab 07/22/18 1632 07/24/18 0944 07/25/18 0349  CALCIUM 10.4* 9.5 8.7*    CBC Recent Labs  Lab 07/22/18 1632 07/24/18 0944 07/25/18 0349 07/25/18 1154  WBC 15.1* 5.2 5.5  --   HGB 13.3 12.8 10.9* 9.2*  HCT 40.4 37.0 34.1* 27.0*  PLT 204 193 158  --     Coag's Recent Labs  Lab 07/24/18 0944 07/24/18 1856 07/25/18 0349  INR 3.01 1.26 1.23    Sepsis Markers Recent Labs  Lab 07/24/18 0944 07/24/18 1313  LATICACIDVEN 2.4* 2.2*    ABG Recent Labs  Lab 07/25/18 1154  PHART 7.205*  PCO2ART 43.5  PO2ART 99.0    Liver Enzymes Recent Labs  Lab 07/22/18 1632 07/24/18 0944 07/25/18 0349  AST 14 20 26   ALT 10 15 19   ALKPHOS 110 120 98  BILITOT 0.9 1.8* 2.7*  ALBUMIN 3.5 2.7* 2.1*    Cardiac Enzymes No results for input(s): TROPONINI, PROBNP in the last 168 hours.  Glucose Recent Labs  Lab 07/24/18 1804 07/24/18 2014 07/25/18 0006 07/25/18 0400 07/25/18 0824  GLUCAP 111* 83 105* 87 78    Imaging No results found.   STUDIES:  CT abdomen 8/16 > Large amount of stool within the sigmoid colon with moderate wall thickening and prominent surrounding inflammatory changes with focal perforation of the anterior sigmoid colonic wall with adjacent 2.0 x 6.8 x 5.4  cm gas and stool containing collection. Additional small foci of pneumoperitoneum and free fluid near the site of perforation and within the mesentery. Etiology of underlying sigmoid colonic inflammation could be related to stercoral colitis or diverticulitis. New 7 mm aggregate pulmonary nodule in the left lower lobe. Enlargement of the infrarenal abdominal aortic aneurysm, now measuring 3.8 cm.  Bilateral nonobstructive nephrolithiasis. Unchanged chronic mild right hydronephrosis.  CULTURES: Blood 8/15 >  ANTIBIOTICS: CTX, Flagyl in ED Zosyn 8/16 >>>  SIGNIFICANT EVENTS:   LINES/TUBES: ETT 8/16 > Art line 8/16> RIJ CVL 8/16 >   ASSESSMENT / PLAN:  Perforated sigmoid colon: secondary to acute diverticulitis vs. S/p colectomy with colostomy placement 8/16.  -General surgery following -Antibiotics as above -NPO, OGT to intermittent low wall suction.  Acute hypoxemic respiratory failure: -Full vent support -Repeat ABG -CXR for ETT placement -VAP bundle -Will attempt WUA. Want to make sure whe have adequate pain control.   Shock: septic in the setting of peritonitis, most prominent in the intraoperative period. Now in ICU she is quickly weaning off pressors.  -Telemetry monitoring -Wean phenylephrine as tolerated -DC vasopressin  -IVF resuscitation  Paroxysmal atrial fibrillation - Holding warfarin, tikosyn acutely.  - Cardiology following  AKI Hyponatremia - IVF hydration gently - Follow BMP  Metabolic Acidosis non-gap - Repeat BMP, ABG now  CAD s/p PCI in 2012.  - Cardiology following. Pre-op risk assessed.   DM - CBG monitoring and SSI  ER positive breast cancer: History of lumpectomy in 2015. -Resume anastrozole when able.   Pulmonary nodule: 85mm aggregate in LLL -Follow up imaging outpatient   FAMILY  - Updates:   - Inter-disciplinary family meet or Palliative Care meeting due by:  8/23   Georgann Housekeeper, AGACNP-BC [facility] Pager [phone] or [phone]  07/25/2018 1:52 PM

## 2018-07-25 NOTE — Anesthesia Procedure Notes (Addendum)
Arterial Line Insertion Start/End8/16/2019 11:35 AM, 07/25/2018 11:35 AM Performed by: Lavell Luster, CRNA, CRNA  Patient location: OR. Preanesthetic checklist: patient identified, IV checked, site marked, risks and benefits discussed, surgical consent, monitors and equipment checked, pre-op evaluation, timeout performed and anesthesia consent Left, radial was placed Catheter size: 20 G Hand hygiene performed , maximum sterile barriers used  and Seldinger technique used Allen's test indicative of satisfactory collateral circulation Attempts: 1 Procedure performed without using ultrasound guided technique. Following insertion, dressing applied and Biopatch. Post procedure assessment: normal  Patient tolerated the procedure well with no immediate complications.

## 2018-07-25 NOTE — Consult Note (Addendum)
Cardiology Consultation:   Patient ID: Melody Casey; [id number]; 07-Jan-1946   Admit date: 07/24/2018 Date of Consult: 07/25/2018  Primary Care Provider: Dettinger, [name], MD Primary Cardiologist: Minus Breeding, MD Primary Electrophysiologist:  None   Patient Profile:   Melody Casey is a 72 y.o. female with a PMH of CAD s/p NSTEMI with PCI/DES to [place] in 2012, atrial fibrillation on tikosyn and coumadin, infrarenal AAA, HTN, HLD, and recurrent diverticulitis of the colon who is being seen today for the evaluation of preoperative risk assessment at the request of Dr. Donne Hazel.  History of Present Illness:   Melody Casey presented with abdominal pain and constipation.  CT A/P showed focal perforation of the anterior sigmoid wall with pneumoperitoneum and free fluid near the site of perforation within the mesentery. She was started on IV antibiotics given concern for sepsis. Surgery is planning for sigmoid colectomy and colostomy pending preoperative risk assessment. Her INR was reversed in anticipation of surgery and home medications were held given NPO status.   She is without anginal complaints. Limited in mobility by arthritis/arthalgias and RLE scleroderma with muscle wasting. She ambulates with a walker but does not get out much. She denies SOB or chest pain with exertion. Last echo in 2016 with EF 60-65% with normal LV diastolic function. Last ischemic evaluation was a LHC in 2012 with PCI/DES to OM2 in the setting of NSTEMI. She denies cardiac complaints since that time. She has maintained sinus rhythm with tikosyn. She denies recent orthopnea, PND, LE edema, or syncope.   Past Medical History:  Diagnosis Date  . AAA (abdominal aortic aneurysm) ([place])   . Acute diverticulitis   . Anxiety   . Arthritis   . Atrial fibrillation ([place])    a. Dx 03/2011 - on tikosyn/coumadin.  [place] CAD (coronary artery disease)    a. NSTEMI 02/2011: occ mid Cx, DES to OM2, residual nonobst LAD dz.  .  Cancer ([place])    breast  . Diabetes mellitus   . Diverticulitis   . Diverticulitis of colon     2008, 04/2011, 12/2014, 08/2015  . 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 Hemangioma    liver  . HLD (hyperlipidemia)   . HTN (hypertension)   . Osteoporosis   . Tobacco abuse    stopped smoking 2012  . Ureteral obstruction    History of gross hematuria/right hydronephrosis 2/2 to uteropelvic junction obstruction, s/p cystoscopy in January 2007 with bilateral retrograde pyelography, right ureter arthroscopy, right ureteral stent placement, bladder biopsies, stent removal since then.  . Wears dentures    top    Past Surgical History:  Procedure Laterality Date  . BIOPSY N/A 11/01/2015   Procedure: BIOPSY;  Surgeon: Danie Binder, MD;  Location: [facility];  Service: Endoscopy;  Laterality: N/A;  . BREAST LUMPECTOMY WITH NEEDLE LOCALIZATION AND AXILLARY SENTINEL LYMPH NODE BX Right 03/01/2014   Procedure: BREAST LUMPECTOMY WITH NEEDLE LOCALIZATION AND AXILLARY SENTINEL LYMPH NODE BX;  Surgeon: Edward Jolly, MD;  Location: [facility];  Service: General;  Laterality: Right;  . BREAST SURGERY    . CARDIAC CATHETERIZATION  2012   stent  . COLONOSCOPY WITH PROPOFOL N/A 06/29/2014   Dr. Wynetta Emery: universal diverticulosis  . CORONARY STENT PLACEMENT  2012  . ESOPHAGOGASTRODUODENOSCOPY (EGD) WITH PROPOFOL N/A 11/01/2015   Procedure: ESOPHAGOGASTRODUODENOSCOPY (EGD) WITH PROPOFOL;  Surgeon: Danie Binder, MD;  Location: [facility];  Service: Endoscopy;  Laterality: N/A;  . KNEE ARTHROSCOPY  left  . Right leg surgery  age 32   As a child for  ?scleroderma per patient  . TONSILLECTOMY AND ADENOIDECTOMY    . TUBAL LIGATION    . Ureteral surgery  2011   rt ureterostomy-stent     Home Medications:  Prior to Admission medications   Medication Sig Start Date End Date Taking? Authorizing Provider  ALPRAZolam (XANAX) 0.25 MG tablet Take 1 tablet (0.25 mg total) by mouth daily  as needed for anxiety. 04/11/17  Yes Dettinger, [name], MD  amLODipine (NORVASC) 2.5 MG tablet Take 1 tablet (2.5 mg total) by mouth daily. Please call to make appointment for further refills. 05/06/18  Yes Minus Breeding, MD  anastrozole (ARIMIDEX) 1 MG tablet Take 1 tablet (1 mg total) by mouth daily. 06/11/18  Yes Magrinat, Virgie Dad, MD  atorvastatin (LIPITOR) 40 MG tablet Take 1 1/2 tablets daily 07/16/18  Yes Minus Breeding, MD  ciprofloxacin (CIPRO) 500 MG tablet Take 1 tablet (500 mg total) by mouth 2 (two) times daily for 10 days. 07/22/18 08/01/18 Yes Stacks, Cletus Gash, MD  dofetilide (TIKOSYN) 250 MCG capsule Take 1 capsule (250 mcg total) by mouth 2 (two) times daily. 01/14/18  Yes Minus Breeding, MD  hydroxychloroquine (PLAQUENIL) 200 MG tablet Take by mouth 2 (two) times daily.   Yes [provider]  KLOR-CON M20 20 MEQ tablet TAKE 1 TABLET BY MOUTH TWICE DAILY Patient taking differently: Take 20 mEq by mouth daily.  02/24/18  Yes Troy Sine, MD  linaclotide Rolan Lipa) 145 MCG CAPS capsule Take 1 capsule (145 mcg total) by mouth daily before breakfast. 07/22/18  Yes Stacks, Cletus Gash, MD  lisinopril (PRINIVIL,ZESTRIL) 40 MG tablet TAKE 1 TABLET BY MOUTH IN THE MORNING 05/06/18  Yes Timmothy Euler, MD  magnesium oxide (MAG-OX) 400 MG tablet Take 1 tablet (400 mg total) by mouth daily. Take 400 mg twice a day with food Patient taking differently: Take 400 mg by mouth daily.  03/26/17  Yes Cherre Robins, PharmD  metFORMIN (GLUCOPHAGE) 500 MG tablet TAKE 1 TABLET BY MOUTH ONCE DAILY 05/12/18  Yes Timmothy Euler, MD  metroNIDAZOLE (FLAGYL) 500 MG tablet Take 1 tablet (500 mg total) by mouth 2 (two) times daily for 10 days. 07/22/18 08/01/18 Yes Stacks, Cletus Gash, MD  nitroGLYCERIN (NITROSTAT) 0.4 MG SL tablet Place 1 tablet (0.4 mg total) under the tongue every 5 (five) minutes as needed for chest pain. 06/30/15  Yes Minus Breeding, MD  warfarin (COUMADIN) 5 MG tablet Take 1 tablet daily except  on Thursdays take 1 and 1/2 tablets. Patient taking differently: Take 5 mg by mouth daily.  10/01/16  Yes Cherre Robins, PharmD  amoxicillin (AMOXIL) 500 MG capsule Take 1 capsule (500 mg total) by mouth 3 (three) times daily. Patient not taking: Reported on 07/24/2018 06/30/18   Claretta Fraise, MD    Inpatient Medications: Scheduled Meds: . insulin aspart  0-9 Units Subcutaneous Q4H   Continuous Infusions: . sodium chloride 125 mL/hr at 07/25/18 0236  . piperacillin-tazobactam (ZOSYN)  IV 3.375 g (07/25/18 0927)   PRN Meds: acetaminophen **OR** acetaminophen, morphine injection, ondansetron **OR** ondansetron (ZOFRAN) IV  Allergies:    Allergies  Allergen Reactions  . Miralax [Polyethylene Glycol] Swelling and Rash    Took [facility] brand developed rash. Patient states she tolerated name brand MiraLax in the past.  09/01/15. Patient states she had diffuse swelling including swelling of her lips.   . Vancomycin Rash and Shortness Of Breath  . Acetaminophen Hives  .  Oxycodone-Acetaminophen Itching  . Sulfa Antibiotics Rash    All over rash  . Sulfacetamide Sodium Rash    All over rash  . Sulfasalazine Rash    All over rash All over rash  . Banana Other (See Comments)    Unknown  . Latex Rash  . Tape Rash    Social History:   Social History   Socioeconomic History  . Marital status: Married    Spouse name: Not on file  . Number of children: 2  . Years of education: Not on file  . Highest education level: Not on file  Occupational History  . Occupation: Research scientist (life sciences)  Social Needs  . Financial resource strain: Not on file  . Food insecurity:    Worry: Not on file    Inability: Not on file  . Transportation needs:    Medical: Not on file    Non-medical: Not on file  Tobacco Use  . Smoking status: Former Smoker    Packs/day: 2.00    Years: 50.00    Pack years: 100.00    Types: Cigarettes    Last attempt to quit: 02/08/2011    Years since quitting: 7.4  .  Smokeless tobacco: Never Used  Substance and Sexual Activity  . Alcohol use: No    Alcohol/week: 0.0 standard drinks  . Drug use: No  . Sexual activity: Never  Lifestyle  . Physical activity:    Days per week: Not on file    Minutes per session: Not on file  . Stress: Not on file  Relationships  . Social connections:    Talks on phone: Not on file    Gets together: Not on file    Attends religious service: Not on file    Active member of club or organization: Not on file    Attends meetings of clubs or organizations: Not on file    Relationship status: Not on file  . Intimate partner violence:    Fear of current or ex partner: Not on file    Emotionally abused: Not on file    Physically abused: Not on file    Forced sexual activity: Not on file  Other Topics Concern  . Not on file  Social History Narrative   Takes care 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     Family History:    Family History  Problem Relation Age of Onset  . Lung cancer Father 80       deceased  . Cancer Father   . Heart failure Mother 79       deceased  . Heart disease Mother   . Diabetes Brother   . Hypertension Brother   . Cancer Brother        prostate  . Colon cancer Neg Hx   . Liver disease Neg Hx      ROS:  Please see the history of present illness.   All other ROS reviewed and negative.     Physical Exam/Data:   Vitals:   07/25/18 0103 07/25/18 0156 07/25/18 0403 07/25/18 0730  BP: (!) 108/53 (!) 101/50 (!) 118/56 (!) 94/46  Pulse: (!) 104  (!) 101   Resp: 17 14 14  (!) 21  Temp: 98.1 F (36.7 C) 98.8 F (37.1 C) 98.1 F (36.7 C) 98.3 F (36.8 C)  TempSrc: Oral Oral Oral Oral  SpO2: 95% 95% 93% 93%  Weight:      Height:        Intake/Output Summary (Last  24 hours) at 07/25/2018 0946 Last data filed at 07/25/2018 0524 Gross per 24 hour  Intake 6172.51 ml  Output 200 ml  Net 5972.51 ml   Filed Weights   07/24/18 0915  Weight: 72.6 kg   Body mass index is 24.33  kg/m.  General:  Well nourished, well developed, laying in bed in no acute distress HEENT: sclera anicteric  Neck: no JVD Vascular: No carotid bruits; distal pulses 2+ bilaterally Cardiac:  normal S1, S2; tachycardic with regular rhythm; no murmurs, rubs, or gallops.  Lungs:  clear to auscultation bilaterally anteriorly, no wheezing, rhonchi or rales  Abd: quiet bowels, TTP in lower quadrants, no hepatomegaly Ext: no edema Musculoskeletal:  No deformities, BUE and BLE strength normal and equal Skin: warm and dry  Neuro:  CNs 2-12 intact, no focal abnormalities noted Psych:  Normal affect   EKG:  The EKG was personally reviewed and demonstrates:  Sinus rhythm without ischemic changes, QTC <400  Relevant CV Studies: Echocardiogram 08/2015: Study Conclusions  - Left ventricle: The cavity size was normal. There was mild   concentric hypertrophy. Systolic function was normal. The   estimated ejection fraction was in the range of 60% to 65%. Wall   motion was normal; there were no regional wall motion   abnormalities. Left ventricular diastolic function parameters   were normal. - Pulmonary arteries: PA peak pressure: 32 mm Hg (S).  Left heart catheterization 2012: CONCLUSIONS: 1. Severe single-vessel coronary artery disease. 2. The mid circumflex is occluded and is likely the culprit for recent     myocardial infarction.  Her symptoms started on Monday, and that probably was the onset.  The vessel appears to be diffusely     diseased distally and fills by collaterals from the LAD and the     RCA.  Thus, the benefit of angioplasty on this vessel is marginal     at best.  I elected not to intervene on this vessel. 3. Successful angioplasty and drug-eluting stent placement to the     proximal OM-2 with a placement of 2.75 x 26 mm Resolute drug-eluting stent which was postdilated in the proximal segment to 3.0. 4. Normal LV systolic function. 5. Moderate disease in the left anterior  descending artery.  RECOMMENDATIONS: 1. Recommend aspirin daily indefinitely as well as Efient 10 mg once daily for at least 12 months. 2. Tobacco cessation. 3. Aggressive treatment of risk factors.  Laboratory Data:  Chemistry Recent Labs  Lab 07/22/18 1632 07/24/18 0944 07/25/18 0349  NA 132* 124* 130*  K 4.6 4.6 4.9  CL 93* 94* 104  CO2 18* 16* 16*  GLUCOSE 134* 152* 86  BUN 32* 58* 54*  CREATININE 1.43* 2.18* 1.49*  CALCIUM 10.4* 9.5 8.7*  GFRNONAA 36* 21* 33*  GFRAA 42* 24* 39*  ANIONGAP  --  14 10    Recent Labs  Lab 07/22/18 1632 07/24/18 0944 07/25/18 0349  PROT 6.5 6.8 5.4*  ALBUMIN 3.5 2.7* 2.1*  AST 14 20 26   ALT 10 15 19   ALKPHOS 110 120 98  BILITOT 0.9 1.8* 2.7*   Hematology Recent Labs  Lab 07/22/18 1632 07/24/18 0944 07/25/18 0349  WBC 15.1* 5.2 5.5  RBC 4.67 4.49 3.96  HGB 13.3 12.8 10.9*  HCT 40.4 37.0 34.1*  MCV 87 82.4 86.1  MCH 28.5 28.5 27.5  MCHC 32.9 34.6 32.0  RDW 17.4* 17.1* 17.5*  PLT 204 193 158   Cardiac EnzymesNo results for input(s): TROPONINI in the last  168 hours. No results for input(s): TROPIPOC in the last 168 hours.  BNPNo results for input(s): BNP, PROBNP in the last 168 hours.  DDimer No results for input(s): DDIMER in the last 168 hours.  Radiology/Studies:  Ct Abdomen Pelvis Wo Contrast  Result Date: 07/24/2018 CLINICAL DATA:  Abdominal pain.  History of diverticulitis. EXAM: CT ABDOMEN AND PELVIS WITHOUT CONTRAST TECHNIQUE: Multidetector CT imaging of the abdomen and pelvis was performed following the standard protocol without IV contrast. COMPARISON:  CT abdomen pelvis dated Apr 10, 2016. FINDINGS: Lower chest: No acute abnormality. New 9 x 5 mm pulmonary nodule in the left lower lobe (series 4, image 13). Hepatobiliary: Hepatic steatosis. No focal liver abnormality. Small layering gallstones. No gallbladder wall thickening or biliary dilatation. Pancreas: Unremarkable. No pancreatic ductal dilatation or  surrounding inflammatory changes. Spleen: Normal in size without focal abnormality. Adrenals/Urinary Tract: The adrenal glands are unremarkable. Unchanged bilateral renal simple cysts. Bilateral hilar renal vascular calcifications. Punctate nonobstructive calculi in both kidneys. Unchanged chronic mild right hydronephrosis with decompressed right ureter. The bladder is unremarkable. Stomach/Bowel: Moderate colonic diverticulosis. Large amount of stool within the sigmoid colon with moderate wall thickening and prominent surrounding inflammatory changes. Focal disruption of the anterior sigmoid colonic wall (series 2, image 62 with adjacent 2.0 x 6.8 x 5.4 cm gas and stool containing collection. The colonic wall defect measures approximately 1.6 cm. The stomach and small bowel are unremarkable.  No obstruction. Vascular/Lymphatic: Interval enlargement of the infrarenal abdominal aortic aneurysm, now measuring 3.8 cm, previously 3.3 cm. Aortoiliac atherosclerosis. Multiple reactive mesenteric lymph nodes in the pelvis. No enlarged abdominal or pelvic lymph nodes. Reproductive: Multiple calcified uterine fibroids.  No adnexal mass. Other: Small amount of free fluid in the mesentery and pelvis. Multiple small foci of pneumoperitoneum near the site of sigmoid colonic perforation and in the mesentery. Musculoskeletal: No acute or significant osseous findings. IMPRESSION: 1. Large amount of stool within the sigmoid colon with moderate wall thickening and prominent surrounding inflammatory changes with focal perforation of the anterior sigmoid colonic wall with adjacent 2.0 x 6.8 x 5.4 cm gas and stool containing collection. Additional small foci of pneumoperitoneum and free fluid near the site of perforation and within the mesentery. 2. Etiology of underlying sigmoid colonic inflammation could be related to stercoral colitis or diverticulitis. 3. New 7 mm aggregate pulmonary nodule in the left lower lobe. Non-contrast chest  CT at 6-12 months is recommended. If the nodule is stable at time of repeat CT, then future CT at 18-24 months (from today's scan) is considered optional for low-risk patients, but is recommended for high-risk patients. This recommendation follows the consensus statement: Guidelines for Management of Incidental Pulmonary Nodules Detected on CT Images: From the Fleischner Society 2017; Radiology 2017; 284:228-243. 4. Enlargement of the infrarenal abdominal aortic aneurysm, now measuring 3.8 cm. Recommend followup by ultrasound in 2 years. This recommendation follows ACR consensus guidelines: White Paper of the ACR Incidental Findings Committee II on Vascular Findings. J Am Coll Radiol 2013; 10:789-794. 5.  Aortic atherosclerosis (ICD10-I70.0). 6. Hepatic steatosis. 7. Cholelithiasis. 8. Bilateral nonobstructive nephrolithiasis. 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 Critical Value/emergent results were called by telephone at the time of interpretation on 07/24/2018 at 11:36 am to Dr. Noemi Chapel, who verbally acknowledged these results. Electronically Signed   By: Titus Dubin M.D.   On: 07/24/2018 11:44   Dg Chest Portable 1 View  Result Date: 07/24/2018 CLINICAL DATA:  History of breast carcinoma.  Upper abdominal pain EXAM: PORTABLE CHEST 1 VIEW COMPARISON:  June 30, 2015 FINDINGS: Upright image obtained. Lungs are clear. Heart is upper normal in size with pulmonary vascularity normal. No adenopathy. There is aortic atherosclerosis. No evident bone lesions. No pneumoperitoneum is demonstrated on this study. IMPRESSION: No edema or consolidation. No demonstrable pneumoperitoneum. There is aortic atherosclerosis. Aortic Atherosclerosis (ICD10-I70.0). Electronically Signed   By: Lowella Grip III M.D.   On: 07/24/2018 09:57    Assessment and Plan:   1. Preoperative risk assessment: patient presented with abdominal pain and constipation. CT A/P showed focal  perforation of the anterior sigmoid wall with pneumoperitoneum and free fluid near the site of perforation within the mesentery. Surgery is planning for sigmoid colectomy and colostomy pending preoperative risk assessment. She is without anginal complaints but not able to complete 4 METS given poor mobility 2/2 arthralgias/myalgias. She has a remote history of MI in 2012 but no prior history of CHF, CVA/TIA, insulin dependent DM.  - Based on the revised cardiac risk index, this patient has a score of 2 (high risk surgery and MI history), with a 10.1% risk of adverse cardiac events in the perioperative setting.  - No further cardiac work-up recommended prior to surgery  2. Atrial fibrillation: patient has maintained sinus rhythm with tikosyn therapy. She has been on coumadin for stroke prevention, which was reversed given need for urgent surgery.  - Recommend initiation of a heparin gtt post-operatively when cleared by surgery for stroke ppx.  - Resume tikosyn when able to tolerate po - Anticipate atrial fibrillation may recur in the perioperative setting and recommend rate control as needed  3. CAD s/p PCI/DES to OM 2012: no anginal complaints. Initial EKG with sinus tachycardia with PACs/PVCs and non-specific ST-T wave abnormalities.  - resume home statin when able to tolerate po  4. HTN: BP soft this admission in the setting of sepsis. Home antihypertensives on hold - Resume home medications as needed when able to tolerate po  5. Perforated sigmoid colon: noted on CT A/P this admission. Planning for urgent colectomy and colostomy today. She is on IV antibiotics.  - Continue management per primary team and surgery  For questions or updates, please contact [facility] Please consult [email or web address] for contact info under Cardiology/STEMI.   Signed, Abigail Butts, PA-C  07/25/2018 9:46 AM [phone]  I have seen and examined the patient along with Abigail Butts, PA-C.  I have  reviewed the chart, notes and new data.  I agree with PA/NP's note.  Key new complaints: no active CV complaints Key examination changes: RRR, normal CV exam Key new findings / data: ECG reviewed, QT acceptable range  PLAN: Moderate increase in surgical risk due to emergency nature if the abdominal procedure, but her chronic CV problems are currently well compensated. Plan to resume anticoagulation and dofetilide postop. Avoid QT prolonging agents (e.g. antibiotics).  Sanda Klein, MD, [place] [phone] 07/25/2018, 11:20 AM

## 2018-07-25 NOTE — Transfer of Care (Signed)
Immediate Anesthesia Transfer of Care Note  Patient: Melody Casey  Procedure(s) Performed: COLOSTOMY (N/A Abdomen) COLECTOMY (N/A Abdomen) EXPLORATORY LAPAROTOMY (N/A Abdomen)  Patient Location: ICU  Anesthesia Type:General  Level of Consciousness: sedated and Patient remains intubated per anesthesia plan  Airway & Oxygen Therapy: Patient remains intubated per anesthesia plan and Patient placed on Ventilator (see vital sign flow sheet for setting)  Post-op Assessment: Report given to RN and Post -op Vital signs reviewed and stable  Post vital signs: Reviewed and stable  Last Vitals:  Vitals Value Taken Time  BP 134/60 07/25/2018  1:08 PM  Temp    Pulse    Resp 20 07/25/2018  1:12 PM  SpO2    Vitals shown include unvalidated device data.  Last Pain:  Vitals:   07/25/18 0730  TempSrc: Oral  PainSc: 5          Complications: No apparent anesthesia complications

## 2018-07-26 ENCOUNTER — Encounter (HOSPITAL_COMMUNITY): Payer: Self-pay | Admitting: General Surgery

## 2018-07-26 ENCOUNTER — Inpatient Hospital Stay (HOSPITAL_COMMUNITY): Payer: Medicare Other

## 2018-07-26 DIAGNOSIS — J9600 Acute respiratory failure, unspecified whether with hypoxia or hypercapnia: Secondary | ICD-10-CM

## 2018-07-26 LAB — POCT I-STAT 3, ART BLOOD GAS (G3+)
ACID-BASE DEFICIT: 4 mmol/L — AB (ref 0.0–2.0)
Acid-base deficit: 5 mmol/L — ABNORMAL HIGH (ref 0.0–2.0)
Bicarbonate: 20.2 mmol/L (ref 20.0–28.0)
Bicarbonate: 21.2 mmol/L (ref 20.0–28.0)
O2 Saturation: 96 %
O2 Saturation: 94 %
PCO2 ART: 36.2 mmHg (ref 32.0–48.0)
PCO2 ART: 37.4 mmHg (ref 32.0–48.0)
PH ART: 7.354 (ref 7.350–7.450)
PO2 ART: 74 mmHg — AB (ref 83.0–108.0)
TCO2: 21 mmol/L — ABNORMAL LOW (ref 22–32)
TCO2: 22 mmol/L (ref 22–32)
pH, Arterial: 7.361 (ref 7.350–7.450)
pO2, Arterial: 83 mmHg (ref 83.0–108.0)

## 2018-07-26 LAB — PREPARE FRESH FROZEN PLASMA

## 2018-07-26 LAB — BPAM FFP
BLOOD PRODUCT EXPIRATION DATE: 201908202359
Blood Product Expiration Date: 201908202359
ISSUE DATE / TIME: 201908152242
ISSUE DATE / TIME: 201908160037
UNIT TYPE AND RH: 7300
Unit Type and Rh: 7300

## 2018-07-26 LAB — GLUCOSE, CAPILLARY
GLUCOSE-CAPILLARY: 108 mg/dL — AB (ref 70–99)
GLUCOSE-CAPILLARY: 135 mg/dL — AB (ref 70–99)
GLUCOSE-CAPILLARY: 165 mg/dL — AB (ref 70–99)
Glucose-Capillary: 119 mg/dL — ABNORMAL HIGH (ref 70–99)
Glucose-Capillary: 194 mg/dL — ABNORMAL HIGH (ref 70–99)

## 2018-07-26 LAB — CBC
HCT: 27.7 % — ABNORMAL LOW (ref 36.0–46.0)
HCT: 27.5 % — ABNORMAL LOW (ref 36.0–46.0)
Hemoglobin: 9.3 g/dL — ABNORMAL LOW (ref 12.0–15.0)
Hemoglobin: 9.1 g/dL — ABNORMAL LOW (ref 12.0–15.0)
MCH: 28 pg (ref 26.0–34.0)
MCH: 28 pg (ref 26.0–34.0)
MCHC: 33.6 g/dL (ref 30.0–36.0)
MCHC: 33.1 g/dL (ref 30.0–36.0)
MCV: 83.4 fL (ref 78.0–100.0)
MCV: 84.6 fL (ref 78.0–100.0)
Platelets: 144 10*3/uL — ABNORMAL LOW (ref 150–400)
Platelets: 165 10*3/uL (ref 150–400)
RBC: 3.32 MIL/uL — AB (ref 3.87–5.11)
RBC: 3.25 MIL/uL — ABNORMAL LOW (ref 3.87–5.11)
RDW: 17.8 % — ABNORMAL HIGH (ref 11.5–15.5)
RDW: 18 % — ABNORMAL HIGH (ref 11.5–15.5)
WBC: 6 10*3/uL (ref 4.0–10.5)
WBC: 8.3 10*3/uL (ref 4.0–10.5)

## 2018-07-26 LAB — COMPREHENSIVE METABOLIC PANEL
ALBUMIN: 2 g/dL — AB (ref 3.5–5.0)
ALK PHOS: 66 U/L (ref 38–126)
ALT: 15 U/L (ref 0–44)
ANION GAP: 9 (ref 5–15)
AST: 26 U/L (ref 15–41)
BUN: 42 mg/dL — ABNORMAL HIGH (ref 8–23)
CHLORIDE: 105 mmol/L (ref 98–111)
CO2: 22 mmol/L (ref 22–32)
CREATININE: 0.88 mg/dL (ref 0.44–1.00)
Calcium: 9 mg/dL (ref 8.9–10.3)
GFR calc non Af Amer: >60 mL/min (ref >60)
GLUCOSE: 115 mg/dL — AB (ref 70–99)
Potassium: 3.8 mmol/L (ref 3.5–5.1)
SODIUM: 136 mmol/L (ref 135–145)
Total Bilirubin: 3.1 mg/dL — ABNORMAL HIGH (ref 0.3–1.2)
Total Protein: 4.7 g/dL — ABNORMAL LOW (ref 6.5–8.1)

## 2018-07-26 LAB — MAGNESIUM: MAGNESIUM: 2.4 mg/dL (ref 1.7–2.4)

## 2018-07-26 MED ORDER — PHENYLEPHRINE HCL-NACL 40-0.9 MG/250ML-% IV SOLN
0.0000 ug/min | INTRAVENOUS | Status: DC
  Administered 2018-07-26: 20 ug/min via INTRAVENOUS
  Administered 2018-07-26: 80 ug/min via INTRAVENOUS
  Filled 2018-07-26: qty 250

## 2018-07-26 MED ORDER — POTASSIUM CHLORIDE 10 MEQ/50ML IV SOLN
10.0000 meq | Freq: Once | INTRAVENOUS | Status: AC
  Administered 2018-07-26: 10 meq via INTRAVENOUS
  Filled 2018-07-26: qty 50

## 2018-07-26 MED ORDER — DILTIAZEM HCL-DEXTROSE 100-5 MG/100ML-% IV SOLN (PREMIX)
5.0000 mg/h | INTRAVENOUS | Status: DC
Start: 2018-07-26 — End: 2018-07-30
  Administered 2018-07-26: 8.5 mg/h via INTRAVENOUS
  Administered 2018-07-26: 5 mg/h via INTRAVENOUS
  Administered 2018-07-27 (×2): 10 mg/h via INTRAVENOUS
  Administered 2018-07-28 – 2018-07-29 (×5): 15 mg/h via INTRAVENOUS
  Filled 2018-07-26 (×9): qty 100

## 2018-07-26 MED ORDER — IPRATROPIUM-ALBUTEROL 0.5-2.5 (3) MG/3ML IN SOLN
3.0000 mL | Freq: Four times a day (QID) | RESPIRATORY_TRACT | Status: DC
  Administered 2018-07-26 – 2018-07-28 (×8): 3 mL via RESPIRATORY_TRACT
  Filled 2018-07-26 (×8): qty 3

## 2018-07-26 MED ORDER — FAMOTIDINE IN NACL 20-0.9 MG/50ML-% IV SOLN
20.0000 mg | Freq: Two times a day (BID) | INTRAVENOUS | Status: DC
  Administered 2018-07-26 – 2018-07-30 (×8): 20 mg via INTRAVENOUS
  Filled 2018-07-26 (×8): qty 50

## 2018-07-26 MED ORDER — METOPROLOL TARTRATE 5 MG/5ML IV SOLN
2.5000 mg | INTRAVENOUS | Status: DC | PRN
  Administered 2018-07-26: 2.5 mg via INTRAVENOUS

## 2018-07-26 MED ORDER — DEXTROSE IN LACTATED RINGERS 5 % IV SOLN
INTRAVENOUS | Status: DC
  Administered 2018-07-26 – 2018-07-27 (×2): via INTRAVENOUS

## 2018-07-26 MED ORDER — ENOXAPARIN SODIUM 30 MG/0.3ML ~~LOC~~ SOLN
30.0000 mg | SUBCUTANEOUS | Status: DC
  Administered 2018-07-26 – 2018-07-27 (×2): 30 mg via SUBCUTANEOUS
  Filled 2018-07-26 (×2): qty 0.3

## 2018-07-26 MED ORDER — LACTATED RINGERS IV BOLUS
2000.0000 mL | Freq: Once | INTRAVENOUS | Status: AC
  Administered 2018-07-26: 10:00:00 via INTRAVENOUS

## 2018-07-26 NOTE — Progress Notes (Signed)
PULMONARY / CRITICAL CARE MEDICINE   Name: Melody Casey MRN: [id number] DOB: July 14, 1946    ADMISSION DATE:  07/24/2018 CONSULTATION DATE:  8/16  REFERRING MD:  Dr. Sloan Leiter  CHIEF COMPLAINT:  Bowel perforation  brief  Patient is encephalopathic and/or intubated. Therefore history has been obtained from chart review. 72 year old female with PMH as below, which is significant for diverticulitis, atrial fibrillation on warfarin and Tikosyn, coronary artery disease status post stenting, diabetes, breast cancer, and hypertension.  She presented to [facility] emergency department on 8/15 with complaints of recent constipation and more recent left lower quadrant pain.  This pain began to radiate to the periumbilical area which is what caused her to present.  CT scan of the abdomen in the emergency department demonstrated a focal perforation of the anterior sigmoid wall of the colon and pneumoperitoneum.  She was admitted for stabilization and reversal of warfarin prior to being taken to the operating room on 8/16 where she underwent colectomy with colostomy placement.  Operative course complicated by shock and acidosis.  Was started on pressors and was postoperatively transferred to the ICU for further monitoring.  Past Medical History:  Diagnosis Date  . AAA (abdominal aortic aneurysm) ([place])   . Acute diverticulitis   . Anxiety   . Arthritis   . Atrial fibrillation ([place])    a. Dx 03/2011 - on tikosyn/coumadin.  Marland Kitchen CAD (coronary artery disease)    a. NSTEMI 02/2011: occ mid Cx, DES to OM2, residual nonobst LAD dz.  . Cancer ([place])    breast  . Diabetes mellitus   . Diverticulitis   . Diverticulitis of colon     2008, 04/2011, 12/2014, 08/2015  . 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 Hemangioma    liver  . HLD (hyperlipidemia)   . HTN (hypertension)   . Osteoporosis   . Tobacco abuse    stopped smoking 2012  . Ureteral obstruction    History of gross hematuria/right hydronephrosis 2/2  to uteropelvic junction obstruction, s/p cystoscopy in January 2007 with bilateral retrograde pyelography, right ureter arthroscopy, right ureteral stent placement, bladder biopsies, stent removal since then.  . Wears dentures    top    LINES/TUBES: ETT 8/16 > Art line 8/16> RIJ CVL 8/16 >     EVENTS 07/24/2018  Admit 07/25/18 -Perforated sigmoid colon: secondary to acute diverticulitis vs. S/p colectomy with colostomy placement 8/16.  - Dr Donne Hazel   SUBJECTIVE/OVERNIGHT/INTERVAL HX 8/17 - on vent and pressors but coming down on neo. On fent gtt. Making urine but is dark. NPO. CCS ok with givingin lovenox for dvt proph tonight. On bic gtt at 50cc/h On fent gtt   VITAL SIGNS: BP (!) 156/68   Pulse 99   Temp 98.9 F (37.2 C) (Axillary)   Resp (!) 25   Ht 5\' 3"  (1.6 m)   Wt 72.6 kg   SpO2 93%   BMI 28.34 kg/m   HEMODYNAMICS:    VENTILATOR SETTINGS: Vent Mode: PRVC FiO2 (%):  [40 %-100 %] 40 % Set Rate:  [20 bmp-26 bmp] 26 bmp Vt Set:  [410 mL-420 mL] 410 mL PEEP:  [5 cmH20] 5 cmH20 Plateau Pressure:  [13 cmH20-15 cmH20] 14 cmH20  INTAKE / OUTPUT: I/O last 3 completed shifts: In: 7967.1 [I.V.:3620.2; Blood:790; IV Piggyback:3556.9] Out: 2010 [Urine:1430; Drains:380; Blood:200]  PHYSICAL EXAMINATION:  General Appearance:    Looks criticall ill  Head:    Normocephalic, without obvious abnormality, atraumatic  Eyes:  PERRL - yes, conjunctiva/corneas - clear      Ears:    Normal external ear canals, both ears  Nose:   NG tube - no  Throat:  ETT TUBE - yes , OG tube - yes  Neck:   Supple,  No enlargement/tenderness/nodules     Lungs:     Clear to auscultation bilaterally, Ventilator   Synchrony - yes  Chest wall:    No deformity  Heart:    S1 and S2 normal, no murmur, CVP - 12.  Pressors - neo coming off  Abdomen:     Soft, no masses, no organomegaly  Genitalia:    Not done  Rectal:   not done  Extremities:   Extremities- intact but RLE atrohpied since a kdi  and baseline     Skin:   Intact in exposed areas . Sacral area - intact     Neurologic:   Sedation - fent gtt -> RASS - -2-3 . Moves all 4s - yes when awake. CAM-ICU - not test but did nod yes to hello . Orientation - na     PULMONARY Recent Labs  Lab 07/25/18 1154 07/25/18 1401 07/25/18 1615 07/25/18 2221  PHART 7.205* 7.191* 7.255* 7.352  PCO2ART 43.5 48.5* 46.5 40.0  PO2ART 99.0 178.0* 116.0* 255.0*  HCO3 17.2* 18.5* 20.6 22.2  TCO2 19* 20* 22 23  O2SAT 96.0 99.0 98.0 100.0    CBC Recent Labs  Lab 07/25/18 1403 07/25/18 2216 07/26/18 0311  HGB 8.1* 9.3* 9.1*  HCT 24.6* 27.7* 27.5*  WBC 2.1* 6.0 8.3  PLT 125* 144* 165    COAGULATION Recent Labs  Lab 07/24/18 0944 07/24/18 1856 07/25/18 0349  INR 3.01 1.26 1.23    CARDIAC   Recent Labs  Lab 07/25/18 1403  TROPONINI <0.03   No results for input(s): PROBNP in the last 168 hours.   CHEMISTRY Recent Labs  Lab 07/22/18 1632 07/24/18 0944 07/25/18 0349 07/25/18 1154 07/25/18 1403 07/26/18 0311  NA 132* 124* 130* 135 138 136  K 4.6 4.6 4.9 4.3 3.9 3.8  CL 93* 94* 104  --  107 105  CO2 18* 16* 16*  --  22 22  GLUCOSE 134* 152* 86  --  102* 115*  BUN 32* 58* 54*  --  45* 42*  CREATININE 1.43* 2.18* 1.49*  --  0.98 0.88  CALCIUM 10.4* 9.5 8.7*  --  8.1* 9.0  MG  --   --   --   --  1.8 2.4  PHOS  --   --   --   --  4.5  --    Estimated Creatinine Clearance: 53.6 mL/min (by C-G formula based on SCr of 0.88 mg/dL).   LIVER Recent Labs  Lab 07/22/18 1632 07/24/18 0944 07/24/18 1856 07/25/18 0349 07/26/18 0311  AST 14 20  --  26 26  ALT 10 15  --  19 15  ALKPHOS 110 120  --  98 66  BILITOT 0.9 1.8*  --  2.7* 3.1*  PROT 6.5 6.8  --  5.4* 4.7*  ALBUMIN 3.5 2.7*  --  2.1* 2.0*  INR  --  3.01 1.26 1.23  --      INFECTIOUS Recent Labs  Lab 07/24/18 0944 07/24/18 1313 07/25/18 1403  LATICACIDVEN 2.4* 2.2* 1.2     ENDOCRINE CBG (last 3)  Recent Labs    07/25/18 2333  07/26/18 0332 07/26/18 0728  GLUCAP 92 119* 108*  IMAGING x48h  - image(s) personally visualized  -   highlighted in bold Ct Abdomen Pelvis Wo Contrast  Result Date: 07/24/2018 CLINICAL DATA:  Abdominal pain.  History of diverticulitis. EXAM: CT ABDOMEN AND PELVIS WITHOUT CONTRAST TECHNIQUE: Multidetector CT imaging of the abdomen and pelvis was performed following the standard protocol without IV contrast. COMPARISON:  CT abdomen pelvis dated Apr 10, 2016. FINDINGS: Lower chest: No acute abnormality. New 9 x 5 mm pulmonary nodule in the left lower lobe (series 4, image 13). Hepatobiliary: Hepatic steatosis. No focal liver abnormality. Small layering gallstones. No gallbladder wall thickening or biliary dilatation. Pancreas: Unremarkable. No pancreatic ductal dilatation or surrounding inflammatory changes. Spleen: Normal in size without focal abnormality. Adrenals/Urinary Tract: The adrenal glands are unremarkable. Unchanged bilateral renal simple cysts. Bilateral hilar renal vascular calcifications. Punctate nonobstructive calculi in both kidneys. Unchanged chronic mild right hydronephrosis with decompressed right ureter. The bladder is unremarkable. Stomach/Bowel: Moderate colonic diverticulosis. Large amount of stool within the sigmoid colon with moderate wall thickening and prominent surrounding inflammatory changes. Focal disruption of the anterior sigmoid colonic wall (series 2, image 62 with adjacent 2.0 x 6.8 x 5.4 cm gas and stool containing collection. The colonic wall defect measures approximately 1.6 cm. The stomach and small bowel are unremarkable.  No obstruction. Vascular/Lymphatic: Interval enlargement of the infrarenal abdominal aortic aneurysm, now measuring 3.8 cm, previously 3.3 cm. Aortoiliac atherosclerosis. Multiple reactive mesenteric lymph nodes in the pelvis. No enlarged abdominal or pelvic lymph nodes. Reproductive: Multiple calcified uterine fibroids.  No adnexal mass.  Other: Small amount of free fluid in the mesentery and pelvis. Multiple small foci of pneumoperitoneum near the site of sigmoid colonic perforation and in the mesentery. Musculoskeletal: No acute or significant osseous findings. IMPRESSION: 1. Large amount of stool within the sigmoid colon with moderate wall thickening and prominent surrounding inflammatory changes with focal perforation of the anterior sigmoid colonic wall with adjacent 2.0 x 6.8 x 5.4 cm gas and stool containing collection. Additional small foci of pneumoperitoneum and free fluid near the site of perforation and within the mesentery. 2. Etiology of underlying sigmoid colonic inflammation could be related to stercoral colitis or diverticulitis. 3. New 7 mm aggregate pulmonary nodule in the left lower lobe. Non-contrast chest CT at 6-12 months is recommended. If the nodule is stable at time of repeat CT, then future CT at 18-24 months (from today's scan) is considered optional for low-risk patients, but is recommended for high-risk patients. This recommendation follows the consensus statement: Guidelines for Management of Incidental Pulmonary Nodules Detected on CT Images: From the Fleischner Society 2017; Radiology 2017; 284:228-243. 4. Enlargement of the infrarenal abdominal aortic aneurysm, now measuring 3.8 cm. Recommend followup by ultrasound in 2 years. This recommendation follows ACR consensus guidelines: White Paper of the ACR Incidental Findings Committee II on Vascular Findings. J Am Coll Radiol 2013; 10:789-794. 5.  Aortic atherosclerosis (ICD10-I70.0). 6. Hepatic steatosis. 7. Cholelithiasis. 8. Bilateral nonobstructive nephrolithiasis. 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 Critical Value/emergent results were called by telephone at the time of interpretation on 07/24/2018 at 11:36 am to Dr. Noemi Chapel, who verbally acknowledged these results. Electronically Signed   By: Titus Dubin M.D.    On: 07/24/2018 11:44   Dg Chest Port 1 View  Result Date: 07/25/2018 CLINICAL DATA:  Endotracheal tube and orogastric tube placement. EXAM: PORTABLE CHEST 1 VIEW COMPARISON:  07/24/2018 and 08/31/2015. FINDINGS: 1350 hours. Endotracheal tube tip is in the mid trachea. Right IJ central  venous catheter extends to the level of the mid SVC. Enteric tube extends to the level of the proximal stomach, side hole at the level of the distal esophagus. The heart size and mediastinal contours are stable with aortic atherosclerosis. There are lower lung volumes with increased patchy left basilar airspace disease. No edema, pneumothorax or significant pleural effusion. IMPRESSION: Support system positioned as above. The enteric tube should be advanced further into the stomach. Increased left lower lobe atelectasis or infiltrate. Electronically Signed   By: Richardean Sale M.D.   On: 07/25/2018 14:00   Dg Chest Portable 1 View  Result Date: 07/24/2018 CLINICAL DATA:  History of breast carcinoma.  Upper abdominal pain EXAM: PORTABLE CHEST 1 VIEW COMPARISON:  June 30, 2015 FINDINGS: Upright image obtained. Lungs are clear. Heart is upper normal in size with pulmonary vascularity normal. No adenopathy. There is aortic atherosclerosis. No evident bone lesions. No pneumoperitoneum is demonstrated on this study. IMPRESSION: No edema or consolidation. No demonstrable pneumoperitoneum. There is aortic atherosclerosis. Aortic Atherosclerosis (ICD10-I70.0). Electronically Signed   By: Lowella Grip III M.D.   On: 07/24/2018 09:57   Dg Abd Portable 1v  Result Date: 07/25/2018 CLINICAL DATA:  OG tube placement EXAM: PORTABLE ABDOMEN - 1 VIEW COMPARISON:  07/25/2018 FINDINGS: Esophageal tube tip and side port overlie the proximal stomach. Airspace disease at the left base. IMPRESSION: Esophageal tube tip and side-port project over the proximal stomach. Electronically Signed   By: Donavan Foil M.D.   On: 07/25/2018 16:12      SIGNIFICANT EVENTS: ASSESSMENT / PLAN:  Perforated sigmoid colon: secondary to acute diverticulitis vs. S/p colectomy with colostomy placement 8/16.  -General surgery following -Antibiotics as above -NPO, OGT to intermittent low wall suction.  Acute hypoxemic respiratory failure: -Full vent support -Repeat ABG -CXR for ETT placement -VAP bundle -Will attempt WUA. Want to make sure whe have adequate pain control.   Shock: septic in the setting of peritonitis, most prominent in the intraoperative period. Now in ICU she is quickly weaning off pressors.  -Telemetry monitoring -Wean phenylephrine as tolerated -DC vasopressin  -IVF resuscitation  Paroxysmal atrial fibrillation - Holding warfarin, tikosyn acutely.  - Cardiology following  AKI Hyponatremia - IVF hydration gently - Follow BMP  Metabolic Acidosis non-gap - Repeat BMP, ABG now  CAD s/p PCI in 2012.  - Cardiology following. Pre-op risk assessed.   DM - CBG monitoring and SSI  ER positive breast cancer: History of lumpectomy in 2015. -Resume anastrozole when able.   Pulmonary nodule: 36mm aggregate in LLL -Follow up imaging outpatient   FAMILY  - Updates:   - Inter-disciplinary family meet or Palliative Care meeting due by:  8/23  ASSESSMENT / PLAN:  PULMONARY A: #baseline: prior smoking hx ( reports that she quit smoking about 7 years ago. Her smoking use included cigarettes. She has a 100.00 pack-year smoking history. She has never used smokeless tobacco. ). AT risk COPD  #current: -  Acute post op resp failure following p lap - given baseline smoking hx and presumed cOPD can make extubation difficult - 55mm LLL nodule on CT at admit  07/26/2018 -> not ready for extubation  P:   Full vent support OPD CT scan needed in feb 2020   CARDIOVASCULAR A:  #baseline: A Fib PAF #current  sinus  07/26/2018 -> holding warfarin and tikosyn. Coiming off neo due to septic circulatory shock  P:   MAP goal > 65 Cards following  Resume dvt proph lovenox dose  daily  RENAL  Intake/Output Summary (Last 24 hours) at 07/26/2018 0943 Last data filed at 07/26/2018 0934 Gross per 24 hour  Intake 5273.72 ml  Output 1810 ml  Net 3463.72 ml   Recent Labs  Lab 07/22/18 1632 07/24/18 0944 07/25/18 0349 07/25/18 1403 07/26/18 0311  CREATININE 1.43* 2.18* 1.49* 0.98 0.88     A:    #current AKI at admit  07/26/2018 -> resolved but urine dark  P:   Monitor Fluid bolus 2L and maintenace  GASTROINTESTINAL A:   S/p P lap for per diverticulitius 07/25/18  P:   og tube to LIS Rest per CCS NPO  HEMATOLOGIC Recent Labs  Lab 07/25/18 1403 07/25/18 2216 07/26/18 0311  HGB 8.1* 9.3* 9.1*  HCT 24.6* 27.7* 27.5*  WBC 2.1* 6.0 8.3  PLT 125* 144* 165    A:   #RBC: anemia critical illnes #Platelet norma #WBC normal  P:  - PRBC for hgb </= 6.9gm%    - exceptions are   -  if ACS susepcted/confirmed then transfuse for hgb </= 8.0gm%,  or    -  active bleeding with hemodynamic instability, then transfuse regardless of hemoglobin value   At at all times try to transfuse 1 unit prbc as possible with exception of active hemorrhage    INFECTIOUS No results for input(s): PROCALCITON in the last 168 hours.  Results for orders placed or performed during the hospital encounter of 07/24/18  Blood Culture (routine x 2)     Status: None (Preliminary result)   Collection Time: 07/24/18 11:07 AM  Result Value Ref Range Status   Specimen Description BLOOD LEFT ARM  Final   Special Requests   Final    BOTTLES DRAWN AEROBIC AND ANAEROBIC Blood Culture adequate volume   Culture   Final    NO GROWTH 2 DAYS Performed at [facility], [address] [place], [place] [address]    Report Status PENDING  Incomplete  Blood Culture (routine x 2)     Status: None (Preliminary result)   Collection Time: 07/24/18 11:07 AM  Result Value Ref Range Status   Specimen Description BLOOD LEFT  HAND  Final   Special Requests   Final    BOTTLES DRAWN AEROBIC AND ANAEROBIC Blood Culture adequate volume   Culture   Final    NO GROWTH 2 DAYS Performed at [facility], [address], [place], [place] [address]    Report Status PENDING  Incomplete  Surgical pcr screen     Status: None   Collection Time: 07/24/18  8:19 PM  Result Value Ref Range Status   MRSA, PCR NEGATIVE NEGATIVE Final   Staphylococcus aureus NEGATIVE NEGATIVE Final    Comment: (NOTE) The Xpert SA Assay (FDA approved for NASAL specimens in patients 36 years of age and older), is one component of a comprehensive surveillance program. It is not intended to diagnose infection nor to guide or monitor treatment. Performed at [facility], [place] [address] [place], [place] [address]   MRSA PCR Screening     Status: None   Collection Time: 07/25/18  1:15 PM  Result Value Ref Range Status   MRSA by PCR NEGATIVE NEGATIVE Final    Comment:        The GeneXpert MRSA Assay (FDA approved for NASAL specimens only), is one component of a comprehensive MRSA colonization surveillance program. It is not intended to diagnose MRSA infection nor to guide or monitor treatment for MRSA infections. Performed at  [facility], [place] [address] [place], [place] [address]     A:   Septic shock due to peritonitis P:   Anti-infectives (From admission, onward)   Start     Dose/Rate Route Frequency Ordered Stop   07/24/18 1800  piperacillin-tazobactam (ZOSYN) IVPB 3.375 g     3.375 g 12.5 mL/hr over 240 Minutes Intravenous Every 8 hours 07/24/18 1725     07/24/18 1045  cefTRIAXone (ROCEPHIN) 2 g in sodium chloride 0.9 % 100 mL IVPB  Status:  Discontinued     2 g 200 mL/hr over 30 Minutes Intravenous Every 24 hours 07/24/18 1044 07/24/18 1722   07/24/18 1045  metroNIDAZOLE (FLAGYL) IVPB 500 mg  Status:  Discontinued     500 mg 100 mL/hr over 60 Minutes Intravenous Every 8 hours 07/24/18 1044 07/24/18 1722        ENDOCRINE A:   At risk hypregluycemia P:   ICU hyperglycemia protocol  NEUROLOGIC A:   #Baseline : nil acute  #Current: pain post op and sedaiton vent need   07/26/2018 -> RASS -2/-3 on fent gt P:   RASS goal: 0 to -3, pain control and vent syncrony fent gtt   FAMILY  - Updates: 07/26/2018 --> son at bedside updated  - Inter-disciplinary family meet or Palliative Care meeting due by:  DAy 7. Current LOS is LOS 2 days   DISPO Keep in ICU    The patient is critically ill with multiple organ systems failure and requires high complexity decision making for assessment and support, frequent evaluation and titration of therapies, application of advanced monitoring technologies and extensive interpretation of multiple databases.   Critical Care Time devoted to patient care services described in this note is  30  Minutes. This time reflects time of care of this signee Dr Brand Males. This critical care time does not reflect procedure time, or teaching time or supervisory time of PA/NP/Med student/Med Resident etc but could involve care discussion time    Dr. Brand Males, M.D., [facility].C.P Pulmonary and Critical Care Medicine Staff Physician [place] [facility] Pager: [phone], If no answer or between  15:00h - 7:00h: call [phone]  07/26/2018 9:43 AM

## 2018-07-26 NOTE — Progress Notes (Signed)
Initial Nutrition Assessment  DOCUMENTATION CODES:  Not applicable  INTERVENTION:  If unable to extubated within 24 hours and medically safe to use GI tract, would begin nutrition support.   Recommend Vital AF 1.2 at goal rate of 55 ml/h (1320 ml per day) and Prostat 30 ml BID to provide 1784 kcals, 129 gm protein, 1071 ml free water daily.  NUTRITION DIAGNOSIS:  Inadequate oral intake related to inability to eat as evidenced by NPO status.  GOAL:  Patient will meet greater than or equal to 90% of their needs  MONITOR:  Diet advancement, Vent status, Labs, Weight trends, I & O's  REASON FOR ASSESSMENT:  Ventilator    ASSESSMENT:  72 y/o female PMHx Afib, CAD, Tobacco abuse, HTN/HLD, DM2, diverticulitis, Breast cancer. Presented to ED 8/15 d/t constipation and LLQ pain radiating to periumbilical area. CT showed perforation w/ pneumoperitoneum s/p colectomy w/ colostomy 8/16. Transferred to ICU post op w/ shock and acidosis.  Patient intubated, sedated, though does give slight head nods the patients son is able to interpret.   Patient had been eating well up until developing her abdominal pain. Denies any changes in appetite. Son does not know pt UBW, but states pt does not appear any different physically. She did not take any vitamins at home. While she did not follow any therapeutic diet, she was "conscious" of her diverticulitis and tried to avoid high seed containing items. She did not routinely take laxatives or softeners.   Weight wise, per chart, patient appears to have been 170-175 lbs for the past couple years up until a month or so ago. She was 167 lbs 8/13-will use as dosing weight. No significant wt loss identified.   Patient is currently intubated on ventilator support MV: 11.3 L/min Temp (24hrs), Avg:97.5 F (36.4 C), Min:96.1 F (35.6 C), Max:100.6 F (38.1 C) Propofol: Off  Physical exam: WDL. General swelling. Abdomen soft.   Labs: Albumin: 2.0, BGs  100-120 Medications Sedation/Analgesia: Fentanyl.  Pressor support: Neosynephrine,  Infusions: IV abx, IVF  Recent Labs  Lab 07/25/18 0349 07/25/18 1154 07/25/18 1403 07/26/18 0311  NA 130* 135 138 136  K 4.9 4.3 3.9 3.8  CL 104  --  107 105  CO2 16*  --  22 22  BUN 54*  --  45* 42*  CREATININE 1.49*  --  0.98 0.88  CALCIUM 8.7*  --  8.1* 9.0  MG  --   --  1.8 2.4  PHOS  --   --  4.5  --   GLUCOSE 86  --  102* 115*    NUTRITION - FOCUSED PHYSICAL EXAM:   Most Recent Value  Orbital Region  No depletion  Upper Arm Region  No depletion  Thoracic and Lumbar Region  No depletion  Buccal Region  No depletion  Temple Region  No depletion  Clavicle Bone Region  No depletion  Clavicle and Acromion Bone Region  No depletion  Scapular Bone Region  No depletion  Dorsal Hand  No depletion  Patellar Region  No depletion  Anterior Thigh Region  No depletion  Posterior Calf Region  No depletion  Edema (RD Assessment)  Mild  Hair  Reviewed  Eyes  Reviewed  Mouth  Reviewed  Skin  Reviewed  Nails  Reviewed     Diet Order:   Diet Order            Diet NPO time specified  Diet effective now  EDUCATION NEEDS:  No education needs have been identified at this time  Skin: Surgical wound to abdomen  Last BM:  8/15  Height:  Ht Readings from Last 1 Encounters:  07/26/18 5\' 8"  (1.727 m)   Weight:  Wt Readings from Last 1 Encounters:  07/26/18 75.8 kg   Wt Readings from Last 10 Encounters:  07/26/18 75.8 kg  07/22/18 75.8 kg  07/10/18 77 kg  06/30/18 76.2 kg  06/24/18 76.5 kg  06/11/18 90.5 kg  06/03/18 78.2 kg  05/12/18 78 kg  05/08/18 77.8 kg  04/24/18 79 kg   Ideal Body Weight:  52.27 kg  BMI:  Body mass index is 25.41 kg/m.  Dosing weight: 75.97 kg Estimated Nutritional Needs:  Kcal:  1760 kg Protein:  115-130g Pro (1.5-1.7g/kg bw) Fluid:  Per MD fluid goals  Burtis Junes RD, LDN, CNSC Clinical Nutrition Available Tues-Sat via Pager:  [phone] 07/26/2018 1:20 PM

## 2018-07-26 NOTE — Progress Notes (Signed)
1 Day Post-Op   Subjective/Chief Complaint: Pt con't to be intubated and on pressors this AM No acute changes   Objective: Vital signs in last 24 hours: Temp:  [96.1 F (35.6 C)-100.6 F (38.1 C)] 98.9 F (37.2 C) (08/17 0730) Pulse Rate:  [63-132] 93 (08/17 0732) Resp:  [15-28] 26 (08/17 0732) BP: (39-178)/(15-98) 178/65 (08/17 0732) SpO2:  [93 %-100 %] 94 % (08/17 0732) Arterial Line BP: (93-161)/(41-71) 129/45 (08/17 0600) FiO2 (%):  [40 %-100 %] 40 % (08/17 0732) Last BM Date: 07/24/18  Intake/Output from previous day: 08/16 0701 - 08/17 0700 In: 4926.3 [I.V.:2644.4; IV Piggyback:2281.9] Out: 1810 [Urine:1230; Drains:380; Blood:200] Intake/Output this shift: Total I/O In: 198.6 [I.V.:183.9; IV Piggyback:14.6] Out: -   General appearance: alert GI: soft, approp ttp, NS, ostomy pink, JP SS, midline wound dressed  Lab Results:  Recent Labs    07/25/18 2216 07/26/18 0311  WBC 6.0 8.3  HGB 9.3* 9.1*  HCT 27.7* 27.5*  PLT 144* 165   BMET Recent Labs    07/25/18 1403 07/26/18 0311  NA 138 136  K 3.9 3.8  CL 107 105  CO2 22 22  GLUCOSE 102* 115*  BUN 45* 42*  CREATININE 0.98 0.88  CALCIUM 8.1* 9.0   PT/INR Recent Labs    07/24/18 1856 07/25/18 0349  LABPROT 15.7* 15.4*  INR 1.26 1.23   ABG Recent Labs    07/25/18 1615 07/25/18 2221  PHART 7.255* 7.352  HCO3 20.6 22.2    Studies/Results: Ct Abdomen Pelvis Wo Contrast  Result Date: 07/24/2018 CLINICAL DATA:  Abdominal pain.  History of diverticulitis. EXAM: CT ABDOMEN AND PELVIS WITHOUT CONTRAST TECHNIQUE: Multidetector CT imaging of the abdomen and pelvis was performed following the standard protocol without IV contrast. COMPARISON:  CT abdomen pelvis dated Apr 10, 2016. FINDINGS: Lower chest: No acute abnormality. New 9 x 5 mm pulmonary nodule in the left lower lobe (series 4, image 13). Hepatobiliary: Hepatic steatosis. No focal liver abnormality. Small layering gallstones. No gallbladder wall  thickening or biliary dilatation. Pancreas: Unremarkable. No pancreatic ductal dilatation or surrounding inflammatory changes. Spleen: Normal in size without focal abnormality. Adrenals/Urinary Tract: The adrenal glands are unremarkable. Unchanged bilateral renal simple cysts. Bilateral hilar renal vascular calcifications. Punctate nonobstructive calculi in both kidneys. Unchanged chronic mild right hydronephrosis with decompressed right ureter. The bladder is unremarkable. Stomach/Bowel: Moderate colonic diverticulosis. Large amount of stool within the sigmoid colon with moderate wall thickening and prominent surrounding inflammatory changes. Focal disruption of the anterior sigmoid colonic wall (series 2, image 62 with adjacent 2.0 x 6.8 x 5.4 cm gas and stool containing collection. The colonic wall defect measures approximately 1.6 cm. The stomach and small bowel are unremarkable.  No obstruction. Vascular/Lymphatic: Interval enlargement of the infrarenal abdominal aortic aneurysm, now measuring 3.8 cm, previously 3.3 cm. Aortoiliac atherosclerosis. Multiple reactive mesenteric lymph nodes in the pelvis. No enlarged abdominal or pelvic lymph nodes. Reproductive: Multiple calcified uterine fibroids.  No adnexal mass. Other: Small amount of free fluid in the mesentery and pelvis. Multiple small foci of pneumoperitoneum near the site of sigmoid colonic perforation and in the mesentery. Musculoskeletal: No acute or significant osseous findings. IMPRESSION: 1. Large amount of stool within the sigmoid colon with moderate wall thickening and prominent surrounding inflammatory changes with focal perforation of the anterior sigmoid colonic wall with adjacent 2.0 x 6.8 x 5.4 cm gas and stool containing collection. Additional small foci of pneumoperitoneum and free fluid near the site of perforation and within the  mesentery. 2. Etiology of underlying sigmoid colonic inflammation could be related to stercoral colitis or  diverticulitis. 3. New 7 mm aggregate pulmonary nodule in the left lower lobe. Non-contrast chest CT at 6-12 months is recommended. If the nodule is stable at time of repeat CT, then future CT at 18-24 months (from today's scan) is considered optional for low-risk patients, but is recommended for high-risk patients. This recommendation follows the consensus statement: Guidelines for Management of Incidental Pulmonary Nodules Detected on CT Images: From the Fleischner Society 2017; Radiology 2017; 284:228-243. 4. Enlargement of the infrarenal abdominal aortic aneurysm, now measuring 3.8 cm. Recommend followup by ultrasound in 2 years. This recommendation follows ACR consensus guidelines: White Paper of the ACR Incidental Findings Committee II on Vascular Findings. J Am Coll Radiol 2013; 10:789-794. 5.  Aortic atherosclerosis (ICD10-I70.0). 6. Hepatic steatosis. 7. Cholelithiasis. 8. Bilateral nonobstructive nephrolithiasis. 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 Critical Value/emergent results were called by telephone at the time of interpretation on 07/24/2018 at 11:36 am to Dr. Noemi Chapel, who verbally acknowledged these results. Electronically Signed   By: Titus Dubin M.D.   On: 07/24/2018 11:44   Dg Chest Port 1 View  Result Date: 07/25/2018 CLINICAL DATA:  Endotracheal tube and orogastric tube placement. EXAM: PORTABLE CHEST 1 VIEW COMPARISON:  07/24/2018 and 08/31/2015. FINDINGS: 1350 hours. Endotracheal tube tip is in the mid trachea. Right IJ central venous catheter extends to the level of the mid SVC. Enteric tube extends to the level of the proximal stomach, side hole at the level of the distal esophagus. The heart size and mediastinal contours are stable with aortic atherosclerosis. There are lower lung volumes with increased patchy left basilar airspace disease. No edema, pneumothorax or significant pleural effusion. IMPRESSION: Support system positioned  as above. The enteric tube should be advanced further into the stomach. Increased left lower lobe atelectasis or infiltrate. Electronically Signed   By: Richardean Sale M.D.   On: 07/25/2018 14:00   Dg Chest Portable 1 View  Result Date: 07/24/2018 CLINICAL DATA:  History of breast carcinoma.  Upper abdominal pain EXAM: PORTABLE CHEST 1 VIEW COMPARISON:  June 30, 2015 FINDINGS: Upright image obtained. Lungs are clear. Heart is upper normal in size with pulmonary vascularity normal. No adenopathy. There is aortic atherosclerosis. No evident bone lesions. No pneumoperitoneum is demonstrated on this study. IMPRESSION: No edema or consolidation. No demonstrable pneumoperitoneum. There is aortic atherosclerosis. Aortic Atherosclerosis (ICD10-I70.0). Electronically Signed   By: Lowella Grip III M.D.   On: 07/24/2018 09:57   Dg Abd Portable 1v  Result Date: 07/25/2018 CLINICAL DATA:  OG tube placement EXAM: PORTABLE ABDOMEN - 1 VIEW COMPARISON:  07/25/2018 FINDINGS: Esophageal tube tip and side port overlie the proximal stomach. Airspace disease at the left base. IMPRESSION: Esophageal tube tip and side-port project over the proximal stomach. Electronically Signed   By: Donavan Foil M.D.   On: 07/25/2018 16:12    Anti-infectives: Anti-infectives (From admission, onward)   Start     Dose/Rate Route Frequency Ordered Stop   07/24/18 1800  piperacillin-tazobactam (ZOSYN) IVPB 3.375 g     3.375 g 12.5 mL/hr over 240 Minutes Intravenous Every 8 hours 07/24/18 1725     07/24/18 1045  cefTRIAXone (ROCEPHIN) 2 g in sodium chloride 0.9 % 100 mL IVPB  Status:  Discontinued     2 g 200 mL/hr over 30 Minutes Intravenous Every 24 hours 07/24/18 1044 07/24/18 1722   07/24/18 1045  metroNIDAZOLE (FLAGYL)  IVPB 500 mg  Status:  Discontinued     500 mg 100 mL/hr over 60 Minutes Intravenous Every 8 hours 07/24/18 1044 07/24/18 1722      Assessment/Plan: 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 -begin dressing changes tomorrow -con't bx  -weaning vent/pressors per CCM -awaiting bowel function   LOS: 2 days    Rosario Jacks., [facility] 07/26/2018

## 2018-07-26 NOTE — Progress Notes (Signed)
   BAck in A Fib RVR Still off pressors  Plan abg x 1  Start cardizem gtt  Dr. Brand Males, M.D., [facility].C.P Pulmonary and Critical Care Medicine Staff Physician, [place] Director - Interstitial Lung Disease  Program  Pulmonary [place] at [place], [place], [address]  Pager: [phone], If no answer or between  15:00h - 7:00h: call [phone] Telephone: [phone]

## 2018-07-27 ENCOUNTER — Inpatient Hospital Stay (HOSPITAL_COMMUNITY): Payer: Medicare Other

## 2018-07-27 LAB — GLUCOSE, CAPILLARY
GLUCOSE-CAPILLARY: 208 mg/dL — AB (ref 70–99)
GLUCOSE-CAPILLARY: 208 mg/dL — AB (ref 70–99)
Glucose-Capillary: 155 mg/dL — ABNORMAL HIGH (ref 70–99)
Glucose-Capillary: 171 mg/dL — ABNORMAL HIGH (ref 70–99)
Glucose-Capillary: 187 mg/dL — ABNORMAL HIGH (ref 70–99)
Glucose-Capillary: 204 mg/dL — ABNORMAL HIGH (ref 70–99)
Glucose-Capillary: 223 mg/dL — ABNORMAL HIGH (ref 70–99)

## 2018-07-27 LAB — CBC WITH DIFFERENTIAL/PLATELET
Basophils Absolute: 0.1 10*3/uL (ref 0.0–0.1)
Basophils Relative: 1 %
EOS ABS: 0 10*3/uL (ref 0.0–0.7)
Eosinophils Relative: 0 %
HCT: 24.4 % — ABNORMAL LOW (ref 36.0–46.0)
Hemoglobin: 8.2 g/dL — ABNORMAL LOW (ref 12.0–15.0)
LYMPHS PCT: 6 %
Lymphs Abs: 0.4 10*3/uL — ABNORMAL LOW (ref 0.7–4.0)
MCH: 27.6 pg (ref 26.0–34.0)
MCHC: 33.6 g/dL (ref 30.0–36.0)
MCV: 82.2 fL (ref 78.0–100.0)
MONO ABS: 0.4 10*3/uL (ref 0.1–1.0)
Monocytes Relative: 6 %
NEUTROS PCT: 87 %
Neutro Abs: 6.5 10*3/uL (ref 1.7–7.7)
PLATELETS: 143 10*3/uL — AB (ref 150–400)
RBC: 2.97 MIL/uL — ABNORMAL LOW (ref 3.87–5.11)
RDW: 18.2 % — AB (ref 11.5–15.5)
WBC MORPHOLOGY: INCREASED
WBC: 7.4 10*3/uL (ref 4.0–10.5)

## 2018-07-27 LAB — LACTIC ACID, PLASMA: LACTIC ACID, VENOUS: 1.3 mmol/L (ref 0.5–1.9)

## 2018-07-27 LAB — BASIC METABOLIC PANEL
ANION GAP: 8 (ref 5–15)
BUN: 43 mg/dL — ABNORMAL HIGH (ref 8–23)
CALCIUM: 9.2 mg/dL (ref 8.9–10.3)
CO2: 24 mmol/L (ref 22–32)
Chloride: 105 mmol/L (ref 98–111)
Creatinine, Ser: 0.45 mg/dL (ref 0.44–1.00)
Glucose, Bld: 226 mg/dL — ABNORMAL HIGH (ref 70–99)
POTASSIUM: 3.6 mmol/L (ref 3.5–5.1)
SODIUM: 137 mmol/L (ref 135–145)

## 2018-07-27 LAB — PHOSPHORUS: Phosphorus: 2 mg/dL — ABNORMAL LOW (ref 2.5–4.6)

## 2018-07-27 LAB — MAGNESIUM: MAGNESIUM: 2 mg/dL (ref 1.7–2.4)

## 2018-07-27 LAB — TROPONIN I: TROPONIN I: 0.03 ng/mL — AB (ref <0.03)

## 2018-07-27 MED ORDER — FUROSEMIDE 10 MG/ML IJ SOLN
40.0000 mg | Freq: Two times a day (BID) | INTRAMUSCULAR | Status: DC
  Administered 2018-07-27 – 2018-07-28 (×3): 40 mg via INTRAVENOUS
  Filled 2018-07-27 (×3): qty 4

## 2018-07-27 MED ORDER — POTASSIUM PHOSPHATES 15 MMOLE/5ML IV SOLN
15.0000 mmol | Freq: Once | INTRAVENOUS | Status: AC
  Administered 2018-07-27: 15 mmol via INTRAVENOUS
  Filled 2018-07-27: qty 5

## 2018-07-27 NOTE — Progress Notes (Signed)
CRITICAL VALUE ALERT  Critical Value:  Trop I = 0.03  Date & Time Notied:  07/27/18 @ 6389  Provider Notified: E-link MD   Orders Received/Actions taken: not at this time

## 2018-07-27 NOTE — Progress Notes (Signed)
2 Days Post-Op   Subjective/Chief Complaint: No acute events Remains on vent   Objective: Vital signs in last 24 hours: Temp:  [97.4 F (36.3 C)-99.1 F (37.3 C)] 97.7 F (36.5 C) (08/18 0728) Pulse Rate:  [68-154] 84 (08/18 0820) Resp:  [11-27] 26 (08/18 0820) BP: (87-166)/(47-94) 146/76 (08/18 0800) SpO2:  [74 %-96 %] 93 % (08/18 0820) Arterial Line BP: (94-182)/(38-70) 154/52 (08/18 0820) FiO2 (%):  [40 %] 40 % (08/18 0747) Weight:  [75.8 kg] 75.8 kg (08/17 1311) Last BM Date: 07/24/18  Intake/Output from previous day: 08/17 0701 - 08/18 0700 In: 3817.8 [I.V.:2023.3; NG/GT:60; IV Piggyback:1734.5] Out: 1120 [Urine:965; Emesis/NG output:100; Drains:55] Intake/Output this shift: Total I/O In: 76.5 [I.V.:76.5] Out: -   Exam: Awake on vent Abdomen soft, ostomy pink, wound clean Drain serosang  Lab Results:  Recent Labs    07/26/18 0311 07/27/18 0452  WBC 8.3 7.4  HGB 9.1* 8.2*  HCT 27.5* 24.4*  PLT 165 143*   BMET Recent Labs    07/26/18 0311 07/27/18 0009  NA 136 137  K 3.8 3.6  CL 105 105  CO2 22 24  GLUCOSE 115* 226*  BUN 42* 43*  CREATININE 0.88 0.45  CALCIUM 9.0 9.2   PT/INR Recent Labs    07/24/18 1856 07/25/18 0349  LABPROT 15.7* 15.4*  INR 1.26 1.23   ABG Recent Labs    07/26/18 1059 07/26/18 1350  PHART 7.354 7.361  HCO3 20.2 21.2    Studies/Results: Dg Chest Port 1 View  Result Date: 07/27/2018 CLINICAL DATA:  Endotracheal tube EXAM: PORTABLE CHEST 1 VIEW COMPARISON:  1 day prior FINDINGS: Endotracheal tube terminates 7.0 cm above carina.There may be an overlying esophageal probe. Nasogastric tube extends beyond the inferior aspect of the film. Right internal jugular line tip at low SVC. Midline trachea. Normal heart size. Atherosclerosis in the transverse aorta. Small left pleural effusion persists. No pneumothorax. Left greater than right base airspace disease. IMPRESSION: Persistent left greater than right base airspace  disease, favoring atelectasis. Especially at the left lung base, pneumonia cannot be excluded. Endotracheal tube borderline high in position, 7.0 cm above carina. This could be advanced 2-3 cm or re-evaluated at follow-up. Aortic Atherosclerosis (ICD10-I70.0). Small left pleural effusion. Electronically Signed   By: Abigail Miyamoto M.D.   On: 07/27/2018 07:40   Dg Chest Port 1 View  Result Date: 07/26/2018 CLINICAL DATA:  Respiratory failure EXAM: PORTABLE CHEST 1 VIEW COMPARISON:  07/25/2018 FINDINGS: Two catheters overlying the UPPER midline are noted, 1 with tip overlying the RIGHT mainstem bronchus and the other 6 cm above the carina. It is difficult to determine which of these represents the endotracheal tube. A RIGHT IJ central venous catheter with tip overlying the LOWER SVC and NG tube entering the stomach again noted. Mild bibasilar atelectasis identified, LEFT-greater-than-RIGHT. No pneumothorax. Cardiomediastinal silhouette is stable. IMPRESSION: Two catheters overlying the UPPER midline, difficult to determine which one represents the endotracheal tube. One lies with tip overlying the RIGHT mainstem bronchus and the other tip lies 6 cm above the carina. Consider repeat imaging with external tubing moved to the side. Otherwise unchanged appearance the chest with mild bibasilar atelectasis, LEFT-greater-than-RIGHT. Electronically Signed   By: Margarette Canada M.D.   On: 07/26/2018 12:00   Dg Chest Port 1 View  Result Date: 07/25/2018 CLINICAL DATA:  Endotracheal tube and orogastric tube placement. EXAM: PORTABLE CHEST 1 VIEW COMPARISON:  07/24/2018 and 08/31/2015. FINDINGS: 1350 hours. Endotracheal tube tip is in the mid trachea.  Right IJ central venous catheter extends to the level of the mid SVC. Enteric tube extends to the level of the proximal stomach, side hole at the level of the distal esophagus. The heart size and mediastinal contours are stable with aortic atherosclerosis. There are lower lung  volumes with increased patchy left basilar airspace disease. No edema, pneumothorax or significant pleural effusion. IMPRESSION: Support system positioned as above. The enteric tube should be advanced further into the stomach. Increased left lower lobe atelectasis or infiltrate. Electronically Signed   By: Richardean Sale M.D.   On: 07/25/2018 14:00   Dg Abd Portable 1v  Result Date: 07/25/2018 CLINICAL DATA:  OG tube placement EXAM: PORTABLE ABDOMEN - 1 VIEW COMPARISON:  07/25/2018 FINDINGS: Esophageal tube tip and side port overlie the proximal stomach. Airspace disease at the left base. IMPRESSION: Esophageal tube tip and side-port project over the proximal stomach. Electronically Signed   By: Donavan Foil M.D.   On: 07/25/2018 16:12    Anti-infectives: Anti-infectives (From admission, onward)   Start     Dose/Rate Route Frequency Ordered Stop   07/24/18 1800  piperacillin-tazobactam (ZOSYN) IVPB 3.375 g     3.375 g 12.5 mL/hr over 240 Minutes Intravenous Every 8 hours 07/24/18 1725     07/24/18 1045  cefTRIAXone (ROCEPHIN) 2 g in sodium chloride 0.9 % 100 mL IVPB  Status:  Discontinued     2 g 200 mL/hr over 30 Minutes Intravenous Every 24 hours 07/24/18 1044 07/24/18 1722   07/24/18 1045  metroNIDAZOLE (FLAGYL) IVPB 500 mg  Status:  Discontinued     500 mg 100 mL/hr over 60 Minutes Intravenous Every 8 hours 07/24/18 1044 07/24/18 1722      Assessment/Plan: POD#2 from Hartman's procedure for perforation  Wound care Antibiotics If staying on vent, ok from surgery to start tube feeds slowly  LOS: 3 days    Haruye Lainez A 07/27/2018

## 2018-07-27 NOTE — Progress Notes (Signed)
   Re round  Failed sbt Unable to wean off fent gtt - but at low dose ciontinu lasix   Dr. Brand Males, M.D., [facility].C.P Pulmonary and Critical Care Medicine Staff Physician, [place] Director - Interstitial Lung Disease  Program  Pulmonary [place] at [place], [place], [address]  Pager: [phone], If no answer or between  15:00h - 7:00h: call [phone] Telephone: [phone]

## 2018-07-27 NOTE — Progress Notes (Signed)
ETT advanced per MD order from 22 at the lip to 25 at the lip.

## 2018-07-27 NOTE — Progress Notes (Addendum)
Pts son and Husband at [facility]

## 2018-07-27 NOTE — Progress Notes (Addendum)
PULMONARY / CRITICAL CARE MEDICINE   Name: Melody Casey MRN: [id number] DOB: 24-Mar-1946    ADMISSION DATE:  07/24/2018 CONSULTATION DATE:  8/16  REFERRING MD:  Dr. Sloan Leiter  CHIEF COMPLAINT:  Bowel perforation  brief  Patient is encephalopathic and/or intubated. Therefore history has been obtained from chart review. 72 year old female with PMH as below, which is significant for diverticulitis, atrial fibrillation on warfarin and Tikosyn, coronary artery disease status post stenting, diabetes, breast cancer, and hypertension.  She presented to [facility] emergency department on 8/15 with complaints of recent constipation and more recent left lower quadrant pain.  This pain began to radiate to the periumbilical area which is what caused her to present.  CT scan of the abdomen in the emergency department demonstrated a focal perforation of the anterior sigmoid wall of the colon and pneumoperitoneum.  She was admitted for stabilization and reversal of warfarin prior to being taken to the operating room on 8/16 where she underwent colectomy with colostomy placement.  Operative course complicated by shock and acidosis.  Was started on pressors and was postoperatively transferred to the ICU for further monitoring.   has a past medical history of AAA (abdominal aortic aneurysm) ([place]), Acute diverticulitis, Anxiety, Arthritis, Atrial fibrillation ([place]), CAD (coronary artery disease), Cancer ([place]), Diabetes mellitus, Diverticulitis, Diverticulitis of colon ( ), Foot deformity (1947), Hemangioma, HLD (hyperlipidemia), HTN (hypertension), Osteoporosis, Tobacco abuse, Ureteral obstruction, and Wears dentures.   has a past surgical history that includes Tonsillectomy and adenoidectomy; Knee arthroscopy; Tubal ligation; Right leg surgery (age 47); Ureteral surgery (2011); Coronary stent placement (2012); Cardiac catheterization (2012); Breast lumpectomy with needle localization and axillary sentinel lymph node bx  (Right, 03/01/2014); Colonoscopy with propofol (N/A, 06/29/2014); Esophagogastroduodenoscopy (egd) with propofol (N/A, 11/01/2015); biopsy (N/A, 11/01/2015); Breast surgery; Colon resection (N/A, 07/25/2018); Partial colectomy (N/A, 07/25/2018); and laparotomy (N/A, 07/25/2018).   LINES/TUBES: ETT 8/16 > Art line 8/16> RIJ CVL 8/16 >     EVENTS 07/24/2018  Admit 07/25/18 -Perforated sigmoid colon: secondary to acute diverticulitis vs. S/p colectomy with colostomy placement 8/16.  - Dr Donne Hazel  8/17 - on vent and pressors but coming down on neo. On fent gtt. Making urine but is dark. NPO. CCS ok with givingin lovenox for dvt proph tonight. On bic gtt at 50cc/h On fent gtt    SUBJECTIVE/OVERNIGHT/INTERVAL HX 8/18 - off pressors since 4am after going back on it. Not on bic gtt. On fent gtt at 100cc/, cardizem at 10cc/h, D5 LR at 50cc/h. On 40% fio2 / peep 5.,  -> following commands. HR 92-100, BP high when stimalted. + 11.7L since admmit  ET tube 7cm above carina   VITAL SIGNS: BP (!) 146/76   Pulse 92   Temp 97.7 F (36.5 C) (Oral)   Resp (!) 24   Ht 5\' 8"  (1.727 m)   Wt 75.8 kg   SpO2 93%   BMI 25.41 kg/m   HEMODYNAMICS: CVP:  [9 mmHg-14 mmHg] 9 mmHg  VENTILATOR SETTINGS: Vent Mode: PRVC FiO2 (%):  [40 %] 40 % Set Rate:  [26 bmp] 26 bmp Vt Set:  [410 mL] 410 mL PEEP:  [5 cmH20] 5 cmH20 Plateau Pressure:  [12 cmH20-15 cmH20] 13 cmH20  INTAKE / OUTPUT: I/O last 3 completed shifts: In: 5326.9 [I.V.:3449.4; NG/GT:60; IV Piggyback:1817.5] Out: 7564 [Urine:1470; Emesis/NG output:100; Drains:125]  PHYSICAL EXAMINATION:  General Appearance:    Looks criticall ill and deconditioned looking. OBESE - no  Head:    Normocephalic, without obvious abnormality,  atraumatic  Eyes:    PERRL - yes, conjunctiva/corneas - clear      Ears:    Normal external ear canals, both ears  Nose:   NG tube - no  Throat:  ETT TUBE - yes , OG tube - yes  Neck:   Supple,  No  enlargement/tenderness/nodules     Lungs:     Clear to auscultation bilaterally, Ventilator   Synchrony - yes  Chest wall:    No deformity  Heart:    S1 and S2 normal, no murmur, CVP - no.  Pressors - no  Abdomen:     Soft, no masses, no organomegaly  Genitalia:    Not done  Rectal:   not done  Extremities:   Extremities- intact but mild edema +     Skin:   Intact in exposed areas . Sacral area - intact     Neurologic:   Sedation - fent gtt at 139mcg/h -> RASS - -1 . Moves all 4s - yes. CAM-ICU - unable to test . Orientation - unable to test        PULMONARY Recent Labs  Lab 07/25/18 1401 07/25/18 1615 07/25/18 2221 07/26/18 1059 07/26/18 1350  PHART 7.191* 7.255* 7.352 7.354 7.361  PCO2ART 48.5* 46.5 40.0 36.2 37.4  PO2ART 178.0* 116.0* 255.0* 83.0 74.0*  HCO3 18.5* 20.6 22.2 20.2 21.2  TCO2 20* 22 23 21* 22  O2SAT 99.0 98.0 100.0 96.0 94.0    CBC Recent Labs  Lab 07/25/18 2216 07/26/18 0311 07/27/18 0452  HGB 9.3* 9.1* 8.2*  HCT 27.7* 27.5* 24.4*  WBC 6.0 8.3 7.4  PLT 144* 165 143*    COAGULATION Recent Labs  Lab 07/24/18 0944 07/24/18 1856 07/25/18 0349  INR 3.01 1.26 1.23    CARDIAC   Recent Labs  Lab 07/25/18 1403 07/27/18 0452  TROPONINI <0.03 0.03*   No results for input(s): PROBNP in the last 168 hours.   CHEMISTRY Recent Labs  Lab 07/24/18 0944 07/25/18 0349 07/25/18 1154 07/25/18 1403 07/26/18 0311 07/27/18 0009 07/27/18 0452  NA 124* 130* 135 138 136 137  --   K 4.6 4.9 4.3 3.9 3.8 3.6  --   CL 94* 104  --  107 105 105  --   CO2 16* 16*  --  22 22 24   --   GLUCOSE 152* 86  --  102* 115* 226*  --   BUN 58* 54*  --  45* 42* 43*  --   CREATININE 2.18* 1.49*  --  0.98 0.88 0.45  --   CALCIUM 9.5 8.7*  --  8.1* 9.0 9.2  --   MG  --   --   --  1.8 2.4  --  2.0  PHOS  --   --   --  4.5  --   --  2.0*   Estimated Creatinine Clearance: 62.2 mL/min (by C-G formula based on SCr of 0.45 mg/dL).   LIVER Recent Labs  Lab  07/22/18 1632 07/24/18 0944 07/24/18 1856 07/25/18 0349 07/26/18 0311  AST 14 20  --  26 26  ALT 10 15  --  19 15  ALKPHOS 110 120  --  98 66  BILITOT 0.9 1.8*  --  2.7* 3.1*  PROT 6.5 6.8  --  5.4* 4.7*  ALBUMIN 3.5 2.7*  --  2.1* 2.0*  INR  --  3.01 1.26 1.23  --      INFECTIOUS Recent Labs  Lab 07/24/18 1313 07/25/18  1403 07/27/18 0009  LATICACIDVEN 2.2* 1.2 1.3     ENDOCRINE CBG (last 3)  Recent Labs    07/26/18 2355 07/27/18 0330 07/27/18 0731  GLUCAP 223* 204* 187*         IMAGING x48h  - image(s) personally visualized  -   highlighted in bold Dg Chest Port 1 View  Result Date: 07/27/2018 CLINICAL DATA:  Endotracheal tube EXAM: PORTABLE CHEST 1 VIEW COMPARISON:  1 day prior FINDINGS: Endotracheal tube terminates 7.0 cm above carina.There may be an overlying esophageal probe. Nasogastric tube extends beyond the inferior aspect of the film. Right internal jugular line tip at low SVC. Midline trachea. Normal heart size. Atherosclerosis in the transverse aorta. Small left pleural effusion persists. No pneumothorax. Left greater than right base airspace disease. IMPRESSION: Persistent left greater than right base airspace disease, favoring atelectasis. Especially at the left lung base, pneumonia cannot be excluded. Endotracheal tube borderline high in position, 7.0 cm above carina. This could be advanced 2-3 cm or re-evaluated at follow-up. Aortic Atherosclerosis (ICD10-I70.0). Small left pleural effusion. Electronically Signed   By: Abigail Miyamoto M.D.   On: 07/27/2018 07:40   Dg Chest Port 1 View  Result Date: 07/26/2018 CLINICAL DATA:  Respiratory failure EXAM: PORTABLE CHEST 1 VIEW COMPARISON:  07/25/2018 FINDINGS: Two catheters overlying the UPPER midline are noted, 1 with tip overlying the RIGHT mainstem bronchus and the other 6 cm above the carina. It is difficult to determine which of these represents the endotracheal tube. A RIGHT IJ central venous catheter  with tip overlying the LOWER SVC and NG tube entering the stomach again noted. Mild bibasilar atelectasis identified, LEFT-greater-than-RIGHT. No pneumothorax. Cardiomediastinal silhouette is stable. IMPRESSION: Two catheters overlying the UPPER midline, difficult to determine which one represents the endotracheal tube. One lies with tip overlying the RIGHT mainstem bronchus and the other tip lies 6 cm above the carina. Consider repeat imaging with external tubing moved to the side. Otherwise unchanged appearance the chest with mild bibasilar atelectasis, LEFT-greater-than-RIGHT. Electronically Signed   By: Margarette Canada M.D.   On: 07/26/2018 12:00   Dg Chest Port 1 View  Result Date: 07/25/2018 CLINICAL DATA:  Endotracheal tube and orogastric tube placement. EXAM: PORTABLE CHEST 1 VIEW COMPARISON:  07/24/2018 and 08/31/2015. FINDINGS: 1350 hours. Endotracheal tube tip is in the mid trachea. Right IJ central venous catheter extends to the level of the mid SVC. Enteric tube extends to the level of the proximal stomach, side hole at the level of the distal esophagus. The heart size and mediastinal contours are stable with aortic atherosclerosis. There are lower lung volumes with increased patchy left basilar airspace disease. No edema, pneumothorax or significant pleural effusion. IMPRESSION: Support system positioned as above. The enteric tube should be advanced further into the stomach. Increased left lower lobe atelectasis or infiltrate. Electronically Signed   By: Richardean Sale M.D.   On: 07/25/2018 14:00   Dg Abd Portable 1v  Result Date: 07/25/2018 CLINICAL DATA:  OG tube placement EXAM: PORTABLE ABDOMEN - 1 VIEW COMPARISON:  07/25/2018 FINDINGS: Esophageal tube tip and side port overlie the proximal stomach. Airspace disease at the left base. IMPRESSION: Esophageal tube tip and side-port project over the proximal stomach. Electronically Signed   By: Donavan Foil M.D.   On: 07/25/2018 16:12      SIGNIFICANT EVENTS: ASSESSMENT / PLAN:  PULMONARY A: #baseline: prior smoking hx ( reports that she quit smoking about 7 years ago. Her smoking use included cigarettes. She has  a 100.00 pack-year smoking history. She has never used smokeless tobacco. ). AT risk COPD  #current: -  Acute post op resp failure following p lap -  - 90mm LLL nodule on CT at admit  07/24/2018   07/27/2018 -> 07/27/2018 - > does YES  meet criteria for SBT but NOT for Extubation in setting of Acute Respiratory Failure due to deconditioning, volume overload  P:   Advance et tube 3cm Take sedation to prn (see CNS) SBT 07/27/18 but NO extubaion Continue BD for presumed copd Diurese - see CVS/rena, sectioin OPD CT scan needed in feb 2020   CARDIOVASCULAR A:  #baseline: A Fib PAF and CAD s/p PCI in 201[address] #current  sinus  07/27/2018 -> holding warfarin and tikosyn. Off neo following septic shock. On DVT proph with lovenox. > 11L overload. On Cardizem gtt for A Fib - and controlled   P:  MAP goal > 65 Continue cardizem gtt Lasix  Resume dvt proph lovenox dose daily  RENAL  Intake/Output Summary (Last 24 hours) at 07/27/2018 0837 Last data filed at 07/27/2018 0700 Gross per 24 hour  Intake 3560.07 ml  Output 1080 ml  Net 2480.07 ml   Recent Labs  Lab 07/24/18 0944 07/25/18 0349 07/25/18 1403 07/26/18 0311 07/27/18 0009  CREATININE 2.18* 1.49* 0.98 0.88 0.45     A:    #current AKI at admit  07/27/2018 -> resolved as of 07/25/18. > 11L volume overload +. Mild Low Phos +  P:   Replete K Phos Diurese Monitor Change d5LR to kvo  GASTROINTESTINAL A:   S/p P lap for per diverticulitius 07/25/18  P:   og tube to LIS Rest per CCS NPO TF timing per CCS  HEMATOLOGIC Recent Labs  Lab 07/25/18 2216 07/26/18 0311 07/27/18 0452  HGB 9.3* 9.1* 8.2*  HCT 27.7* 27.5* 24.4*  WBC 6.0 8.3 7.4  PLT 144* 165 143*    A:   #RBC: anemia critical illnes #Platelet norma #WBC normal  P:   - PRBC for hgb </= 6.9gm%    - exceptions are   -  if ACS susepcted/confirmed then transfuse for hgb </= 8.0gm%,  or    -  active bleeding with hemodynamic instability, then transfuse regardless of hemoglobin value   At at all times try to transfuse 1 unit prbc as possible with exception of active hemorrhage    INFECTIOUS No results for input(s): PROCALCITON in the last 168 hours.  Results for orders placed or performed during the hospital encounter of 07/24/18  Blood Culture (routine x 2)     Status: None (Preliminary result)   Collection Time: 07/24/18 11:07 AM  Result Value Ref Range Status   Specimen Description BLOOD LEFT ARM  Final   Special Requests   Final    BOTTLES DRAWN AEROBIC AND ANAEROBIC Blood Culture adequate volume   Culture   Final    NO GROWTH 3 DAYS Performed at [facility], [address] [place], [place] [address]    Report Status PENDING  Incomplete  Blood Culture (routine x 2)     Status: None (Preliminary result)   Collection Time: 07/24/18 11:07 AM  Result Value Ref Range Status   Specimen Description BLOOD LEFT HAND  Final   Special Requests   Final    BOTTLES DRAWN AEROBIC AND ANAEROBIC Blood Culture adequate volume   Culture   Final    NO GROWTH 3 DAYS Performed at [facility], [address], [place], [place] [address]  Report Status PENDING  Incomplete  Surgical pcr screen     Status: None   Collection Time: 07/24/18  8:19 PM  Result Value Ref Range Status   MRSA, PCR NEGATIVE NEGATIVE Final   Staphylococcus aureus NEGATIVE NEGATIVE Final    Comment: (NOTE) The Xpert SA Assay (FDA approved for NASAL specimens in patients 44 years of age and older), is one component of a comprehensive surveillance program. It is not intended to diagnose infection nor to guide or monitor treatment. Performed at [facility], [place] [address] [place], [place] [address]   MRSA PCR Screening     Status: None   Collection Time: 07/25/18  1:15 PM   Result Value Ref Range Status   MRSA by PCR NEGATIVE NEGATIVE Final    Comment:        The GeneXpert MRSA Assay (FDA approved for NASAL specimens only), is one component of a comprehensive MRSA colonization surveillance program. It is not intended to diagnose MRSA infection nor to guide or monitor treatment for MRSA infections. Performed at [facility], [place] [address] [place], [place] [address]     A:   Septic shock due to peritonitis P:   Anti-infectives (From admission, onward)   Start     Dose/Rate Route Frequency Ordered Stop   07/24/18 1800  piperacillin-tazobactam (ZOSYN) IVPB 3.375 g     3.375 g 12.5 mL/hr over 240 Minutes Intravenous Every 8 hours 07/24/18 1725     07/24/18 1045  cefTRIAXone (ROCEPHIN) 2 g in sodium chloride 0.9 % 100 mL IVPB  Status:  Discontinued     2 g 200 mL/hr over 30 Minutes Intravenous Every 24 hours 07/24/18 1044 07/24/18 1722   07/24/18 1045  metroNIDAZOLE (FLAGYL) IVPB 500 mg  Status:  Discontinued     500 mg 100 mL/hr over 60 Minutes Intravenous Every 8 hours 07/24/18 1044 07/24/18 1722       ENDOCRINE A:   At risk hypregluycemia P:   ICU hyperglycemia protocol  NEUROLOGIC A:   #Baseline : nil acute  #Current: pain post op and sedaiton vent need   07/27/2018 -> RASS --1 on fent gtt  P:   RASS goal: 0 to -2, pain control and vent syncrony Hold fent gtt and if well - aim to go to fent prn   FAMILY  - Updates: 07/27/2018 --> son at bedside with RN present  - Inter-disciplinary family meet or Palliative Care meeting due by:  DAy 7. Current LOS is LOS 3 days   DISPO Keep ICU   The patient is critically ill with multiple organ systems failure and requires high complexity decision making for assessment and support, frequent evaluation and titration of therapies, application of advanced monitoring technologies and extensive interpretation of multiple databases.   Critical Care Time devoted to patient care services  described in this note is  30  Minutes. This time reflects time of care of this signee Dr Brand Males. This critical care time does not reflect procedure time, or teaching time or supervisory time of PA/NP/Med student/Med Resident etc but could involve care discussion time    Dr. Brand Males, M.D., [facility].C.P Pulmonary and Critical Care Medicine Staff Physician [place] [facility] Pager: [phone], If no answer or between  15:00h - 7:00h: call [phone]  07/27/2018 8:58 AM

## 2018-07-28 DIAGNOSIS — K659 Peritonitis, unspecified: Secondary | ICD-10-CM

## 2018-07-28 DIAGNOSIS — G9340 Encephalopathy, unspecified: Secondary | ICD-10-CM

## 2018-07-28 DIAGNOSIS — R6521 Severe sepsis with septic shock: Secondary | ICD-10-CM

## 2018-07-28 DIAGNOSIS — J9601 Acute respiratory failure with hypoxia: Secondary | ICD-10-CM

## 2018-07-28 LAB — CBC WITH DIFFERENTIAL/PLATELET
ABS IMMATURE GRANULOCYTES: 0.2 10*3/uL — AB (ref 0.0–0.1)
Basophils Absolute: 0 10*3/uL (ref 0.0–0.1)
Basophils Relative: 0 %
Eosinophils Absolute: 0 10*3/uL (ref 0.0–0.7)
Eosinophils Relative: 0 %
HEMATOCRIT: 25.6 % — AB (ref 36.0–46.0)
HEMOGLOBIN: 8.6 g/dL — AB (ref 12.0–15.0)
Immature Granulocytes: 3 %
LYMPHS PCT: 9 %
Lymphs Abs: 0.8 10*3/uL (ref 0.7–4.0)
MCH: 27.7 pg (ref 26.0–34.0)
MCHC: 33.6 g/dL (ref 30.0–36.0)
MCV: 82.6 fL (ref 78.0–100.0)
MONO ABS: 0.3 10*3/uL (ref 0.1–1.0)
MONOS PCT: 4 %
NEUTROS ABS: 7.1 10*3/uL (ref 1.7–7.7)
Neutrophils Relative %: 84 %
Platelets: 164 10*3/uL (ref 150–400)
RBC: 3.1 MIL/uL — ABNORMAL LOW (ref 3.87–5.11)
RDW: 18.5 % — ABNORMAL HIGH (ref 11.5–15.5)
WBC: 8.4 10*3/uL (ref 4.0–10.5)

## 2018-07-28 LAB — BASIC METABOLIC PANEL
ANION GAP: 8 (ref 5–15)
ANION GAP: 8 (ref 5–15)
BUN: 26 mg/dL — ABNORMAL HIGH (ref 8–23)
BUN: 21 mg/dL (ref 8–23)
CALCIUM: 8.4 mg/dL — AB (ref 8.9–10.3)
CHLORIDE: 106 mmol/L (ref 98–111)
CO2: 28 mmol/L (ref 22–32)
CO2: 30 mmol/L (ref 22–32)
Calcium: 8.5 mg/dL — ABNORMAL LOW (ref 8.9–10.3)
Chloride: 102 mmol/L (ref 98–111)
Creatinine, Ser: 0.64 mg/dL (ref 0.44–1.00)
Creatinine, Ser: 0.63 mg/dL (ref 0.44–1.00)
GFR calc Af Amer: >60 mL/min (ref >60)
GFR calc non Af Amer: >60 mL/min (ref >60)
Glucose, Bld: 176 mg/dL — ABNORMAL HIGH (ref 70–99)
Glucose, Bld: 127 mg/dL — ABNORMAL HIGH (ref 70–99)
POTASSIUM: 3.3 mmol/L — AB (ref 3.5–5.1)
Potassium: 2.9 mmol/L — ABNORMAL LOW (ref 3.5–5.1)
SODIUM: 140 mmol/L (ref 135–145)
Sodium: 142 mmol/L (ref 135–145)

## 2018-07-28 LAB — GLUCOSE, CAPILLARY
GLUCOSE-CAPILLARY: 117 mg/dL — AB (ref 70–99)
GLUCOSE-CAPILLARY: 143 mg/dL — AB (ref 70–99)
GLUCOSE-CAPILLARY: 157 mg/dL — AB (ref 70–99)
Glucose-Capillary: 163 mg/dL — ABNORMAL HIGH (ref 70–99)
Glucose-Capillary: 126 mg/dL — ABNORMAL HIGH (ref 70–99)
Glucose-Capillary: 150 mg/dL — ABNORMAL HIGH (ref 70–99)

## 2018-07-28 LAB — PHOSPHORUS: Phosphorus: 1.8 mg/dL — ABNORMAL LOW (ref 2.5–4.6)

## 2018-07-28 LAB — MAGNESIUM: Magnesium: 1.4 mg/dL — ABNORMAL LOW (ref 1.7–2.4)

## 2018-07-28 LAB — HEPARIN LEVEL (UNFRACTIONATED): HEPARIN UNFRACTIONATED: 0.39 [IU]/mL (ref 0.30–0.70)

## 2018-07-28 MED ORDER — POTASSIUM CHLORIDE 10 MEQ/50ML IV SOLN
10.0000 meq | INTRAVENOUS | Status: DC
  Administered 2018-07-28 (×3): 10 meq via INTRAVENOUS
  Filled 2018-07-28 (×3): qty 50

## 2018-07-28 MED ORDER — MAGNESIUM SULFATE 2 GM/50ML IV SOLN
2.0000 g | Freq: Once | INTRAVENOUS | Status: AC
  Administered 2018-07-28: 2 g via INTRAVENOUS
  Filled 2018-07-28: qty 50

## 2018-07-28 MED ORDER — IPRATROPIUM-ALBUTEROL 0.5-2.5 (3) MG/3ML IN SOLN: 3.0000 mL | Freq: Four times a day (QID) | RESPIRATORY_TRACT | Status: DC | PRN

## 2018-07-28 MED ORDER — DEXTROSE 5 % IV SOLN
30.0000 mmol | Freq: Once | INTRAVENOUS | Status: AC
  Administered 2018-07-28: 30 mmol via INTRAVENOUS
  Filled 2018-07-28: qty 10

## 2018-07-28 MED ORDER — HEPARIN (PORCINE) IN NACL 100-0.45 UNIT/ML-% IJ SOLN
1500.0000 [IU]/h | INTRAMUSCULAR | Status: DC
  Administered 2018-07-28: 1100 [IU]/h via INTRAVENOUS
  Administered 2018-07-29 – 2018-07-30 (×2): 1250 [IU]/h via INTRAVENOUS
  Administered 2018-07-31: 1300 [IU]/h via INTRAVENOUS
  Administered 2018-07-31 – 2018-08-02 (×3): 1350 [IU]/h via INTRAVENOUS
  Filled 2018-07-28 (×10): qty 250

## 2018-07-28 MED ORDER — FENTANYL CITRATE (PF) 100 MCG/2ML IJ SOLN
25.0000 ug | INTRAMUSCULAR | Status: DC | PRN
  Administered 2018-07-28: 50 ug via INTRAVENOUS
  Administered 2018-07-28: 25 ug via INTRAVENOUS
  Administered 2018-07-29: 50 ug via INTRAVENOUS
  Administered 2018-07-29 – 2018-08-01 (×10): 25 ug via INTRAVENOUS
  Administered 2018-08-01 (×3): 50 ug via INTRAVENOUS
  Administered 2018-08-01 (×3): 25 ug via INTRAVENOUS
  Administered 2018-08-01: 50 ug via INTRAVENOUS
  Administered 2018-08-02: 25 ug via INTRAVENOUS
  Administered 2018-08-02: 50 ug via INTRAVENOUS
  Administered 2018-08-03: 25 ug via INTRAVENOUS
  Administered 2018-08-03 – 2018-08-06 (×2): 50 ug via INTRAVENOUS
  Filled 2018-07-28 (×25): qty 2

## 2018-07-28 MED ORDER — ORAL CARE MOUTH RINSE
15.0000 mL | Freq: Two times a day (BID) | OROMUCOSAL | Status: DC
  Administered 2018-07-28 – 2018-08-06 (×14): 15 mL via OROMUCOSAL

## 2018-07-28 MED ORDER — HEPARIN BOLUS VIA INFUSION
3800.0000 [IU] | Freq: Once | INTRAVENOUS | Status: AC
  Administered 2018-07-28: 3800 [IU] via INTRAVENOUS
  Filled 2018-07-28: qty 3800

## 2018-07-28 MED ORDER — POTASSIUM CHLORIDE 10 MEQ/50ML IV SOLN
10.0000 meq | INTRAVENOUS | Status: AC
  Administered 2018-07-28 – 2018-07-29 (×3): 10 meq via INTRAVENOUS
  Filled 2018-07-28: qty 50

## 2018-07-28 MED ORDER — ALPRAZOLAM 0.25 MG PO TABS
0.2500 mg | ORAL_TABLET | Freq: Three times a day (TID) | ORAL | Status: DC | PRN
  Administered 2018-07-30 – 2018-07-31 (×2): 0.25 mg via ORAL
  Filled 2018-07-28 (×2): qty 1

## 2018-07-28 NOTE — Consult Note (Addendum)
[place] Nurse ostomy follow up Surgical team following for assessment and plan of care to abd wound. Stoma type/location: Colostomy stoma from surgery on 8/16 Stomal assessment/size: Stoma is red and viable, above skin level, 1 3/4 inches, located near a slight crease and narrow  skin bridge next to abd wound. Current pouch was leaking behind the barrier. Peristomal assessment: intact skin surrounding Output: No stool or flatus at this time  Ostomy pouching: 2pc.  Education provided:  Applied barrier ring to assist with maintaining a seal. Demonstrated pouch change to patient and son at the bedside.  Pt watched the procedure and asked appropriate questions.  She is in ICU and did not participate in pouch application or opening and closing velcro to empty.  She requests that her husband be present for teaching sessions when pt is stable and out of ICU.  Supplies at the bedside for staff nurse use. Enrolled patient in [place] Start Discharge program: NO [place] team will continue to follow for teaching sessions. Julien Girt MSN, RN, [place], [place], [place]

## 2018-07-28 NOTE — Progress Notes (Signed)
[place] for Heparin Indication: atrial fibrillation  Allergies  Allergen Reactions  . Miralax [Polyethylene Glycol] Swelling and Rash    Took [facility] brand developed rash. Patient states she tolerated name brand MiraLax in the past.  09/01/15. Patient states she had diffuse swelling including swelling of her lips.   . Vancomycin Rash and Shortness Of Breath  . Acetaminophen Hives  . Oxycodone-Acetaminophen Itching  . Sulfa Antibiotics Rash    All over rash  . Sulfacetamide Sodium Rash    All over rash  . Sulfasalazine Rash    All over rash All over rash  . Banana Other (See Comments)    Unknown  . Latex Rash  . Tape Rash    Patient Measurements: Height: 5\' 8"  (172.7 cm) Weight: 167 lb 1.7 oz (75.8 kg) IBW/kg (Calculated) : 63.9 Heparin Dosing Weight: 76  Vital Signs: Temp: 98.5 F (36.9 C) (08/19 1642) Temp Source: Oral (08/19 1642) BP: 107/65 (08/19 1700) Pulse Rate: 54 (08/19 1800)  Labs: Recent Labs    07/26/18 0311 07/27/18 0009 07/27/18 0452 07/28/18 0409 07/28/18 1717  HGB 9.1*  --  8.2* 8.6*  --   HCT 27.5*  --  24.4* 25.6*  --   PLT 165  --  143* 164  --   HEPARINUNFRC  --   --   --   --  0.39  CREATININE 0.88 0.45  --  0.64  --   TROPONINI  --   --  0.03*  --   --     Estimated Creatinine Clearance: 62.2 mL/min (by C-G formula based on SCr of 0.64 mg/dL).   Medical History: Past Medical History:  Diagnosis Date  . AAA (abdominal aortic aneurysm) ([place])   . Acute diverticulitis   . Anxiety   . Arthritis   . Atrial fibrillation ([place])    a. Dx 03/2011 - on tikosyn/coumadin.  [place] CAD (coronary artery disease)    a. NSTEMI 02/2011: occ mid Cx, DES to OM2, residual nonobst LAD dz.  . Cancer ([place])    breast  . Diabetes mellitus   . Diverticulitis   . Diverticulitis of colon     2008, 04/2011, 12/2014, 08/2015  . 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 Hemangioma    liver  . HLD (hyperlipidemia)   . HTN  (hypertension)   . Osteoporosis   . Tobacco abuse    stopped smoking 2012  . Ureteral obstruction    History of gross hematuria/right hydronephrosis 2/2 to uteropelvic junction obstruction, s/p cystoscopy in January 2007 with bilateral retrograde pyelography, right ureter arthroscopy, right ureteral stent placement, bladder biopsies, stent removal since then.  . Wears dentures    top    Medications:    Assessment: 72 yo F on warfarin and dofetilide PTA for afib. Admitted on 8/15 with abdominal pain and found to have bowel perforation now s/p colectomy and colostomy replacement. Transferred to the ICU postoperatively and was started on pressors. Starting heparin for anticoagulation. H&H 8.6/25.6, plts 164. No documented bleeding.  Initial heparin level is at goal (0.39), no bleeding issues noted, no dose adjustments needed.   Goal of Therapy:  Heparin level 0.3-0.7 units/ml Monitor platelets by anticoagulation protocol: Yes   Plan:  Continue Heparin infusion at 1100 units/hr Continue to monitor CBC, heparin level, s/s of bleeding daily  Erin Hearing PharmD., BCPS Clinical Pharmacist 07/28/2018 6:05 PM

## 2018-07-28 NOTE — Progress Notes (Signed)
[facility]/Trauma Progress Note  3 Days Post-Op   Assessment/Plan Principal Problem:   Sepsis ([place]) Active Problems:   Type 2 diabetes mellitus with complication, without long-term current use of insulin (HCC)   CAD (coronary artery disease)   Anxiety and depression   AAA (abdominal aortic aneurysm) without rupture (HCC)   Microcytic anemia   Diverticulosis   Acute diverticulitis of intestine   Bowel perforation (HCC)   Hyponatremia  Bowel perforation - S/P Hartman's (left colectomy, end transverse colostomy, Dr. Donne Hazel, 08/16 - await bowel function, allow ice chips and sips with meds - pt self extubated this am, doing well on [place]  FEN: NPO, ice chips  VTE: SCD's, lovenox ID: Zosyn 08/15>> Follow up: Dr. Donne Hazel    LOS: 4 days    Subjective: CC: abdominal pain  Pt states mild pain. No other complaints. Friend at bedside states pt is confused.   Objective: Vital signs in last 24 hours: Temp:  [98.1 F (36.7 C)-98.8 F (37.1 C)] 98.8 F (37.1 C) (08/19 0809) Pulse Rate:  [86-136] 100 (08/19 0700) Resp:  [12-29] 13 (08/19 0700) BP: (113-158)/(59-88) 148/80 (08/19 0700) SpO2:  [85 %-96 %] 90 % (08/19 0700) Arterial Line BP: (141-172)/(50-58) 141/50 (08/18 1000) FiO2 (%):  [40 %] 40 % (08/19 0000) Last BM Date: 07/24/18  Intake/Output from previous day: 08/18 0701 - 08/19 0700 In: 1332.1 [I.V.:778.8; NG/GT:30; IV Piggyback:523.3] Out: 3905 [Urine:3800; Drains:105] Intake/Output this shift: Total I/O In: -  Out: 1000 [Urine:1000]  PE: Gen:  Alert, NAD Pulm:  Rate and effort normal Abd: Soft, ND, no BS, no output in ostomy, drain with serous drainage, appropriately tender Skin: no rashes noted, warm and dry   Anti-infectives: Anti-infectives (From admission, onward)   Start     Dose/Rate Route Frequency Ordered Stop   07/24/18 1800  piperacillin-tazobactam (ZOSYN) IVPB 3.375 g     3.375 g 12.5 mL/hr over 240 Minutes Intravenous Every  8 hours 07/24/18 1725     07/24/18 1045  cefTRIAXone (ROCEPHIN) 2 g in sodium chloride 0.9 % 100 mL IVPB  Status:  Discontinued     2 g 200 mL/hr over 30 Minutes Intravenous Every 24 hours 07/24/18 1044 07/24/18 1722   07/24/18 1045  metroNIDAZOLE (FLAGYL) IVPB 500 mg  Status:  Discontinued     500 mg 100 mL/hr over 60 Minutes Intravenous Every 8 hours 07/24/18 1044 07/24/18 1722      Lab Results:  Recent Labs    07/27/18 0452 07/28/18 0409  WBC 7.4 8.4  HGB 8.2* 8.6*  HCT 24.4* 25.6*  PLT 143* 164   BMET Recent Labs    07/27/18 0009 07/28/18 0409  NA 137 142  K 3.6 2.9*  CL 105 106  CO2 24 28  GLUCOSE 226* 176*  BUN 43* 26*  CREATININE 0.45 0.64  CALCIUM 9.2 8.5*   PT/INR No results for input(s): LABPROT, INR in the last 72 hours. CMP     Component Value Date/Time   NA 142 07/28/2018 0409   NA 132 (L) 07/22/2018 1632   NA 134 (L) 06/06/2017 1259   K 2.9 (L) 07/28/2018 0409   K 4.5 06/06/2017 1259   CL 106 07/28/2018 0409   CO2 28 07/28/2018 0409   CO2 24 06/06/2017 1259   GLUCOSE 176 (H) 07/28/2018 0409   GLUCOSE 311 (H) 06/06/2017 1259   BUN 26 (H) 07/28/2018 0409   BUN 32 (H) 07/22/2018 1632   BUN 18.3 06/06/2017 1259   CREATININE  0.64 07/28/2018 0409   CREATININE 1.0 06/06/2017 1259   CALCIUM 8.5 (L) 07/28/2018 0409   CALCIUM 10.6 (H) 06/06/2017 1259   PROT 4.7 (L) 07/26/2018 0311   PROT 6.5 07/22/2018 1632   PROT 7.7 06/06/2017 1259   ALBUMIN 2.0 (L) 07/26/2018 0311   ALBUMIN 3.5 07/22/2018 1632   ALBUMIN 3.3 (L) 06/06/2017 1259   AST 26 07/26/2018 0311   AST 17 06/06/2017 1259   ALT 15 07/26/2018 0311   ALT 14 06/06/2017 1259   ALKPHOS 66 07/26/2018 0311   ALKPHOS 115 06/06/2017 1259   BILITOT 3.1 (H) 07/26/2018 0311   BILITOT 0.9 07/22/2018 1632   BILITOT 0.52 06/06/2017 1259   GFRNONAA >60 07/28/2018 0409   GFRNONAA 80 06/22/2015 1614   GFRAA >60 07/28/2018 0409   GFRAA >89 06/22/2015 1614   Lipase     Component Value Date/Time    LIPASE 24 07/24/2018 0944    Studies/Results: Dg Chest Port 1 View  Result Date: 07/27/2018 CLINICAL DATA:  Respiratory failure. EXAM: PORTABLE CHEST 1 VIEW COMPARISON:  07/27/2018 and prior radiographs FINDINGS: The cardiomediastinal silhouette is unchanged. An endotracheal tube with tip 5.5 cm above the carina, NG tube entering the stomach and RIGHT IJ central venous catheter with tip overlying the LOWER SVC again noted. Bilateral LOWER lung atelectasis/airspace disease, LEFT-greater-than-RIGHT, again noted. Small LEFT effusion again noted. There is no evidence of pneumothorax. IMPRESSION: Little significant change with support apparatus as described. Electronically Signed   By: Margarette Canada M.D.   On: 07/27/2018 11:34   Dg Chest Port 1 View  Result Date: 07/27/2018 CLINICAL DATA:  Endotracheal tube EXAM: PORTABLE CHEST 1 VIEW COMPARISON:  1 day prior FINDINGS: Endotracheal tube terminates 7.0 cm above carina.There may be an overlying esophageal probe. Nasogastric tube extends beyond the inferior aspect of the film. Right internal jugular line tip at low SVC. Midline trachea. Normal heart size. Atherosclerosis in the transverse aorta. Small left pleural effusion persists. No pneumothorax. Left greater than right base airspace disease. IMPRESSION: Persistent left greater than right base airspace disease, favoring atelectasis. Especially at the left lung base, pneumonia cannot be excluded. Endotracheal tube borderline high in position, 7.0 cm above carina. This could be advanced 2-3 cm or re-evaluated at follow-up. Aortic Atherosclerosis (ICD10-I70.0). Small left pleural effusion. Electronically Signed   By: Abigail Miyamoto M.D.   On: 07/27/2018 07:40   Dg Chest Port 1 View  Result Date: 07/26/2018 CLINICAL DATA:  Respiratory failure EXAM: PORTABLE CHEST 1 VIEW COMPARISON:  07/25/2018 FINDINGS: Two catheters overlying the UPPER midline are noted, 1 with tip overlying the RIGHT mainstem bronchus and the  other 6 cm above the carina. It is difficult to determine which of these represents the endotracheal tube. A RIGHT IJ central venous catheter with tip overlying the LOWER SVC and NG tube entering the stomach again noted. Mild bibasilar atelectasis identified, LEFT-greater-than-RIGHT. No pneumothorax. Cardiomediastinal silhouette is stable. IMPRESSION: Two catheters overlying the UPPER midline, difficult to determine which one represents the endotracheal tube. One lies with tip overlying the RIGHT mainstem bronchus and the other tip lies 6 cm above the carina. Consider repeat imaging with external tubing moved to the side. Otherwise unchanged appearance the chest with mild bibasilar atelectasis, LEFT-greater-than-RIGHT. Electronically Signed   By: Margarette Canada M.D.   On: 07/26/2018 12:00      Kalman Drape , [facility] 07/28/2018, 8:23 AM  Pager: [phone] Mon-Wed, Friday 7:00am-4:30pm Thurs 7am-11:30am  Consults: [phone]

## 2018-07-28 NOTE — Progress Notes (Signed)
[name] Progress Note Patient Name: Melody Casey DOB: [birth year] MRN: [id number]   Date of Service  07/28/2018  HPI/Events of Note  Patient self extubated  eICU Interventions  Doing well. Ox3, no stridor. Able to follow simple commands. No resp distress     Intervention Category Intermediate Interventions: Respiratory distress - evaluation and management  Miraya Cudney 07/28/2018, 1:09 AM

## 2018-07-28 NOTE — Progress Notes (Signed)
PULMONARY / CRITICAL CARE MEDICINE   Name: Melody Casey MRN: [id number] DOB: Oct 18, 1946    ADMISSION DATE:  07/24/2018 CONSULTATION DATE:  8/16  REFERRING MD:  Dr. Sloan Leiter  CHIEF COMPLAINT:  Bowel perforation  brief  Patient is encephalopathic and/or intubated. Therefore history has been obtained from chart review. 72 year old female with PMH as below, which is significant for diverticulitis, atrial fibrillation on warfarin and Tikosyn, coronary artery disease status post stenting, diabetes, breast cancer, and hypertension.  She presented to [facility] emergency department on 8/15 with complaints of recent constipation and more recent left lower quadrant pain.  This pain began to radiate to the periumbilical area which is what caused her to present.  CT scan of the abdomen in the emergency department demonstrated a focal perforation of the anterior sigmoid wall of the colon and pneumoperitoneum.  She was admitted for stabilization and reversal of warfarin prior to being taken to the operating room on 8/16 where she underwent colectomy with colostomy placement.  Operative course complicated by shock and acidosis.  Was started on pressors and was postoperatively transferred to the ICU for further monitoring.  LINES/TUBES: ETT 8/16 >8/18 Art line 8/16>8/18 RIJ CVL 8/16 >  EVENTS 07/24/2018  Admit 07/25/18 -Perforated sigmoid colon: secondary to acute diverticulitis vs. S/p colectomy with colostomy placement 8/16.  - Dr Donne Hazel  8/17 - on vent and pressors but coming down on neo. On fent gtt. Making urine but is dark. NPO. CCS ok with givingin lovenox for dvt proph tonight. On bic gtt at 50cc/h On fent gtt  SUBJECTIVE/OVERNIGHT/INTERVAL HX Self extubated overnight but continues to require drip for HR/BP Paranoid  VITAL SIGNS: BP 135/69   Pulse 100   Temp 98.8 F (37.1 C) (Oral)   Resp 15   Ht 5\' 8"  (1.727 m)   Wt 75.8 kg   SpO2 92%   BMI 25.41 kg/m   HEMODYNAMICS: CVP:  [10  mmHg-13 mmHg] 10 mmHg  VENTILATOR SETTINGS: Vent Mode: PRVC FiO2 (%):  [40 %] 40 % Set Rate:  [26 bmp] 26 bmp Vt Set:  [410 mL] 410 mL PEEP:  [5 cmH20] 5 cmH20 Plateau Pressure:  [13 cmH20] 13 cmH20  INTAKE / OUTPUT: I/O last 3 completed shifts: In: 2599.8 [I.V.:1722.6; NG/GT:90; IV Piggyback:787.3] Out: 3557 [Urine:4280; Emesis/NG output:100; Drains:135]  PHYSICAL EXAMINATION: General: Chronically ill appearing female, NAD HEENT: [place]/AT, PERRL, EOM-I and MMM Hrt: RRR, Nl S1/S2 and -M/R/G Lung: Bibasilar crackles Abd: diffusely tender, ND and +BS, ostomy in place Ext: -edema and -tenderness Skin: wound noted, otherwise intact  PULMONARY Recent Labs  Lab 07/25/18 1401 07/25/18 1615 07/25/18 2221 07/26/18 1059 07/26/18 1350  PHART 7.191* 7.255* 7.352 7.354 7.361  PCO2ART 48.5* 46.5 40.0 36.2 37.4  PO2ART 178.0* 116.0* 255.0* 83.0 74.0*  HCO3 18.5* 20.6 22.2 20.2 21.2  TCO2 20* 22 23 21* 22  O2SAT 99.0 98.0 100.0 96.0 94.0   CBC Recent Labs  Lab 07/26/18 0311 07/27/18 0452 07/28/18 0409  HGB 9.1* 8.2* 8.6*  HCT 27.5* 24.4* 25.6*  WBC 8.3 7.4 8.4  PLT 165 143* 164   COAGULATION Recent Labs  Lab 07/24/18 0944 07/24/18 1856 07/25/18 0349  INR 3.01 1.26 1.23   CARDIAC   Recent Labs  Lab 07/25/18 1403 07/27/18 0452  TROPONINI <0.03 0.03*   No results for input(s): PROBNP in the last 168 hours.  CHEMISTRY Recent Labs  Lab 07/25/18 3220 07/25/18 1154 07/25/18 1403 07/26/18 2542 07/27/18 0009 07/27/18 7062 07/28/18 0409  NA 130* 135 138 136 137  --  142  K 4.9 4.3 3.9 3.8 3.6  --  2.9*  CL 104  --  107 105 105  --  106  CO2 16*  --  22 22 24   --  28  GLUCOSE 86  --  102* 115* 226*  --  176*  BUN 54*  --  45* 42* 43*  --  26*  CREATININE 1.49*  --  0.98 0.88 0.45  --  0.64  CALCIUM 8.7*  --  8.1* 9.0 9.2  --  8.5*  MG  --   --  1.8 2.4  --  2.0 1.4*  PHOS  --   --  4.5  --   --  2.0* 1.8*   Estimated Creatinine Clearance: 62.2 mL/min (by C-G  formula based on SCr of 0.64 mg/dL).  LIVER Recent Labs  Lab 07/22/18 1632 07/24/18 0944 07/24/18 1856 07/25/18 0349 07/26/18 0311  AST 14 20  --  26 26  ALT 10 15  --  19 15  ALKPHOS 110 120  --  98 66  BILITOT 0.9 1.8*  --  2.7* 3.1*  PROT 6.5 6.8  --  5.4* 4.7*  ALBUMIN 3.5 2.7*  --  2.1* 2.0*  INR  --  3.01 1.26 1.23  --    INFECTIOUS Recent Labs  Lab 07/24/18 1313 07/25/18 1403 07/27/18 0009  LATICACIDVEN 2.2* 1.2 1.3   ENDOCRINE CBG (last 3)  Recent Labs    07/27/18 2318 07/28/18 0407 07/28/18 0806  GLUCAP 155* 163* 157*   IMAGING x48h  - image(s) personally visualized  -   highlighted in bold Dg Chest Port 1 View  Result Date: 07/27/2018 CLINICAL DATA:  Respiratory failure. EXAM: PORTABLE CHEST 1 VIEW COMPARISON:  07/27/2018 and prior radiographs FINDINGS: The cardiomediastinal silhouette is unchanged. An endotracheal tube with tip 5.5 cm above the carina, NG tube entering the stomach and RIGHT IJ central venous catheter with tip overlying the LOWER SVC again noted. Bilateral LOWER lung atelectasis/airspace disease, LEFT-greater-than-RIGHT, again noted. Small LEFT effusion again noted. There is no evidence of pneumothorax. IMPRESSION: Little significant change with support apparatus as described. Electronically Signed   By: Margarette Canada M.D.   On: 07/27/2018 11:34   Dg Chest Port 1 View  Result Date: 07/27/2018 CLINICAL DATA:  Endotracheal tube EXAM: PORTABLE CHEST 1 VIEW COMPARISON:  1 day prior FINDINGS: Endotracheal tube terminates 7.0 cm above carina.There may be an overlying esophageal probe. Nasogastric tube extends beyond the inferior aspect of the film. Right internal jugular line tip at low SVC. Midline trachea. Normal heart size. Atherosclerosis in the transverse aorta. Small left pleural effusion persists. No pneumothorax. Left greater than right base airspace disease. IMPRESSION: Persistent left greater than right base airspace disease, favoring  atelectasis. Especially at the left lung base, pneumonia cannot be excluded. Endotracheal tube borderline high in position, 7.0 cm above carina. This could be advanced 2-3 cm or re-evaluated at follow-up. Aortic Atherosclerosis (ICD10-I70.0). Small left pleural effusion. Electronically Signed   By: Abigail Miyamoto M.D.   On: 07/27/2018 07:40   Dg Chest Port 1 View  Result Date: 07/26/2018 CLINICAL DATA:  Respiratory failure EXAM: PORTABLE CHEST 1 VIEW COMPARISON:  07/25/2018 FINDINGS: Two catheters overlying the UPPER midline are noted, 1 with tip overlying the RIGHT mainstem bronchus and the other 6 cm above the carina. It is difficult to determine which of these represents the endotracheal tube. A RIGHT IJ central venous catheter  with tip overlying the LOWER SVC and NG tube entering the stomach again noted. Mild bibasilar atelectasis identified, LEFT-greater-than-RIGHT. No pneumothorax. Cardiomediastinal silhouette is stable. IMPRESSION: Two catheters overlying the UPPER midline, difficult to determine which one represents the endotracheal tube. One lies with tip overlying the RIGHT mainstem bronchus and the other tip lies 6 cm above the carina. Consider repeat imaging with external tubing moved to the side. Otherwise unchanged appearance the chest with mild bibasilar atelectasis, LEFT-greater-than-RIGHT. Electronically Signed   By: Margarette Canada M.D.   On: 07/26/2018 12:00   SIGNIFICANT EVENTS: ASSESSMENT / PLAN:  PULMONARY A: Baseline: prior smoking hx ( reports that she quit smoking about 7 years ago. Her smoking use included cigarettes. She has a 100.00 pack-year smoking history. She has never used smokeless tobacco. ). AT risk COPD  Current: -  Acute post op resp failure following p lap -  - 25mm LLL nodule on CT at admit  07/24/2018  07/28/2018 -> 07/28/2018 - > does YES  meet criteria for SBT but NOT for Extubation in setting of Acute Respiratory Failure due to deconditioning, volume overload  P:    Titrate O2 for sat of 88-92% Monitor for airway protection Change fentanyl to PRN for pain SLP, then clears only Continue BD for presumed copd D/C lasix OPD CT scan needed in feb 2020  CARDIOVASCULAR A:  Baseline: A Fib PAF and CAD s/p PCI in 201[address] Current  sinus  07/28/2018 -> holding warfarin and tikosyn. Off neo following septic shock. On DVT proph with lovenox. > 11L overload. On Cardizem gtt for A Fib - and controlled   P:  MAP goal > 65 Continue cardizem gtt Start PO cardizem when able to take PO Restart home Tikosyn when able to take PO D/C Lasix D/C lovenox Heparin drip per pharmacy tof a-fib.  RENAL  Intake/Output Summary (Last 24 hours) at 07/28/2018 1002 Last data filed at 07/28/2018 0900 Gross per 24 hour  Intake 1159.91 ml  Output 4905 ml  Net -3745.09 ml   Recent Labs  Lab 07/25/18 0349 07/25/18 1403 07/26/18 0311 07/27/18 0009 07/28/18 0409  CREATININE 1.49* 0.98 0.88 0.45 0.64   A:   Current AKI at admit  07/28/2018 -> resolved as of 07/25/18. > 11L volume overload +. Mild Low Phos +  P:   Replete K, Mg and Phos D/C lasix Monitor KVO IVF  GASTROINTESTINAL A:   S/p P lap for per diverticulitius 07/25/18  P:   SLP Rest per CCS Clears  HEMATOLOGIC Recent Labs  Lab 07/26/18 0311 07/27/18 0452 07/28/18 0409  HGB 9.1* 8.2* 8.6*  HCT 27.5* 24.4* 25.6*  WBC 8.3 7.4 8.4  PLT 165 143* 164   A:   RBC: anemia critical illnes Platelet norma WBC normal  P:  CBC in AM Transfuse per ICU protocol  INFECTIOUS A:   Septic shock due to peritonitis P:   Continue zosyn, will place a stop date at 7 days  ENDOCRINE A:   At risk hypregluycemia P:   ICU hyperglycemia protocol  NEUROLOGIC A:   On xanax at home Confused this AM P:   Change fentanyl to PRN for pain PRN xanax ordered  FAMILY  - Updates: Son updated bedside  - Inter-disciplinary family meet or Palliative Care meeting due by:  DAy 7. Current LOS is LOS 4  days   DISPO Keep ICU given cardizem drip, restart PO when able to pass PO.  The patient is critically ill with multiple organ systems  failure and requires high complexity decision making for assessment and support, frequent evaluation and titration of therapies, application of advanced monitoring technologies and extensive interpretation of multiple databases.   Critical Care Time devoted to patient care services described in this note is  31  Minutes. This time reflects time of care of this signee Dr Jennet Maduro. This critical care time does not reflect procedure time, or teaching time or supervisory time of PA/NP/Med student/Med Resident etc but could involve care discussion time.  Rush Farmer, M.D. [facility] Pulmonary/Critical Care Medicine. Pager: [phone]. After hours pager: [phone].  07/28/2018 10:02 AM

## 2018-07-28 NOTE — Progress Notes (Signed)
[facility] ADULT ICU REPLACEMENT PROTOCOL FOR AM LAB REPLACEMENT ONLY  The patient does apply for the [facility] Adult ICU Electrolyte Replacment Protocol based on the criteria listed below:   1. Is GFR >/= 40 ml/min? Yes.    Patient's GFR today is >60 2. Is urine output >/= 0.5 ml/kg/hr for the last 6 hours? Yes.   Patient's UOP is 3.0 ml/kg/hr 3. Is BUN < 60 mg/dL? Yes.    Patient's BUN today is 26 4. Abnormal electrolyte(s): K 2.9 5. Ordered repletion with: protocol 6. If a panic level lab has been reported, has the CCM MD in charge been notified? No..   Physician:    Ronda Fairly A 07/28/2018 6:23 AM

## 2018-07-28 NOTE — Progress Notes (Signed)
Walked into patient's room and patient had ETT out in her hand with visitor at bedside  . Pt placed on 5 L/min [place]. Pt oriented to self only, wanting to know why she was here. Resp even non labored 16-22 on 5 L , bilateral breath sounds clear diminished in the bases. E-link notified on pt's condition.

## 2018-07-28 NOTE — Progress Notes (Signed)
ANTICOAGULATION CONSULT NOTE - Initial Consult  Pharmacy Consult for Heparin Indication: atrial fibrillation  Allergies  Allergen Reactions  . Miralax [Polyethylene Glycol] Swelling and Rash    Took [facility] brand developed rash. Patient states she tolerated name brand MiraLax in the past.  09/01/15. Patient states she had diffuse swelling including swelling of her lips.   . Vancomycin Rash and Shortness Of Breath  . Acetaminophen Hives  . Oxycodone-Acetaminophen Itching  . Sulfa Antibiotics Rash    All over rash  . Sulfacetamide Sodium Rash    All over rash  . Sulfasalazine Rash    All over rash All over rash  . Banana Other (See Comments)    Unknown  . Latex Rash  . Tape Rash    Patient Measurements: Height: 5\' 8"  (172.7 cm) Weight: 167 lb 1.7 oz (75.8 kg) IBW/kg (Calculated) : 63.9 Heparin Dosing Weight: 76  Vital Signs: Temp: 98.8 F (37.1 C) (08/19 0809) Temp Source: Oral (08/19 0809) BP: 135/69 (08/19 0900) Pulse Rate: 100 (08/19 0900)  Labs: Recent Labs    07/25/18 1403  07/26/18 0311 07/27/18 0009 07/27/18 0452 07/28/18 0409  HGB 8.1*   < > 9.1*  --  8.2* 8.6*  HCT 24.6*   < > 27.5*  --  24.4* 25.6*  PLT 125*   < > 165  --  143* 164  CREATININE 0.98  --  0.88 0.45  --  0.64  TROPONINI <0.03  --   --   --  0.03*  --    < > = values in this interval not displayed.    Estimated Creatinine Clearance: 62.2 mL/min (by C-G formula based on SCr of 0.64 mg/dL).   Medical History: Past Medical History:  Diagnosis Date  . AAA (abdominal aortic aneurysm) ([place])   . Acute diverticulitis   . Anxiety   . Arthritis   . Atrial fibrillation ([place])    a. Dx 03/2011 - on tikosyn/coumadin.  [place] CAD (coronary artery disease)    a. NSTEMI 02/2011: occ mid Cx, DES to OM2, residual nonobst LAD dz.  . Cancer ([place])    breast  . Diabetes mellitus   . Diverticulitis   . Diverticulitis of colon     2008, 04/2011, 12/2014, 08/2015  . 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 Hemangioma    liver  . HLD (hyperlipidemia)   . HTN (hypertension)   . Osteoporosis   . Tobacco abuse    stopped smoking 2012  . Ureteral obstruction    History of gross hematuria/right hydronephrosis 2/2 to uteropelvic junction obstruction, s/p cystoscopy in January 2007 with bilateral retrograde pyelography, right ureter arthroscopy, right ureteral stent placement, bladder biopsies, stent removal since then.  . Wears dentures    top    Medications:    Assessment: 72 yo F on warfarin and dofetilide PTA for afib. Admitted on 8/15 with abdominal pain and found to have bowel perforation now s/p colectomy and colostomy replacement. Transferred to the ICU postoperatively and was started on pressors. Starting heparin for anticoagulation. H&H 8.6/25.6, plts 164. No documented bleeding.  Goal of Therapy:  Heparin level 0.3-0.7 units/ml Monitor platelets by anticoagulation protocol: Yes   Plan:  Heparin 3800 units bolus x 1 Heparin infusion at 1100 units/hr 6hr heparin level Continue to monitor CBC, heparin level, s/s of bleeding daily  Harrietta Guardian, PharmD PGY1 Pharmacy Resident Direct Phone [phone] 07/28/2018    12:24 PM

## 2018-07-28 NOTE — Progress Notes (Signed)
Pt self extubated herself. No stridor noted. Pt is hemodynamically stable at this time. RN at bedside. Pt is on a 5L [place] at this time SATs are 91%.

## 2018-07-28 NOTE — Progress Notes (Signed)
[facility] Progress Note Patient Name: Melody Casey DOB: 31-Mar-1946 MRN: [id number]   Date of Service  07/28/2018  HPI/Events of Note    eICU Interventions  KCl given     Intervention Category Minor Interventions: Electrolytes abnormality - evaluation and management  Collene Gobble 07/28/2018, 10:45 PM

## 2018-07-28 NOTE — Evaluation (Signed)
Clinical/Bedside Swallow Evaluation Patient Details  Name: Melody Casey MRN: [id number] Date of Birth: 17-Jul-1946  Today's Date: 07/28/2018 Time: SLP Start Time (ACUTE ONLY): 2706 SLP Stop Time (ACUTE ONLY): 1352 SLP Time Calculation (min) (ACUTE ONLY): 14 min  Past Medical History:  Past Medical History:  Diagnosis Date  . AAA (abdominal aortic aneurysm) ([place])   . Acute diverticulitis   . Anxiety   . Arthritis   . Atrial fibrillation ([place])    a. Dx 03/2011 - on tikosyn/coumadin.  Marland Kitchen CAD (coronary artery disease)    a. NSTEMI 02/2011: occ mid Cx, DES to OM2, residual nonobst LAD dz.  . Cancer ([place])    breast  . Diabetes mellitus   . Diverticulitis   . Diverticulitis of colon     2008, 04/2011, 12/2014, 08/2015  . 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 Hemangioma    liver  . HLD (hyperlipidemia)   . HTN (hypertension)   . Osteoporosis   . Tobacco abuse    stopped smoking 2012  . Ureteral obstruction    History of gross hematuria/right hydronephrosis 2/2 to uteropelvic junction obstruction, s/p cystoscopy in January 2007 with bilateral retrograde pyelography, right ureter arthroscopy, right ureteral stent placement, bladder biopsies, stent removal since then.  . Wears dentures    top   Past Surgical History:  Past Surgical History:  Procedure Laterality Date  . BIOPSY N/A 11/01/2015   Procedure: BIOPSY;  Surgeon: Danie Binder, MD;  Location: [facility];  Service: Endoscopy;  Laterality: N/A;  . BREAST LUMPECTOMY WITH NEEDLE LOCALIZATION AND AXILLARY SENTINEL LYMPH NODE BX Right 03/01/2014   Procedure: BREAST LUMPECTOMY WITH NEEDLE LOCALIZATION AND AXILLARY SENTINEL LYMPH NODE BX;  Surgeon: Edward Jolly, MD;  Location: [place];  Service: General;  Laterality: Right;  . BREAST SURGERY    . CARDIAC CATHETERIZATION  2012   stent  . COLON RESECTION N/A 07/25/2018   Procedure: COLOSTOMY;  Surgeon: Rolm Bookbinder, MD;  Location: [place] [facility];  Service:  General;  Laterality: N/A;  . COLONOSCOPY WITH PROPOFOL N/A 06/29/2014   Dr. Wynetta Emery: universal diverticulosis  . CORONARY STENT PLACEMENT  2012  . ESOPHAGOGASTRODUODENOSCOPY (EGD) WITH PROPOFOL N/A 11/01/2015   Procedure: ESOPHAGOGASTRODUODENOSCOPY (EGD) WITH PROPOFOL;  Surgeon: Danie Binder, MD;  Location: [facility];  Service: Endoscopy;  Laterality: N/A;  . KNEE ARTHROSCOPY     left  . LAPAROTOMY N/A 07/25/2018   Procedure: EXPLORATORY LAPAROTOMY;  Surgeon: Rolm Bookbinder, MD;  Location: [place];  Service: General;  Laterality: N/A;  . PARTIAL COLECTOMY N/A 07/25/2018   Procedure: COLECTOMY;  Surgeon: Rolm Bookbinder, MD;  Location: [place];  Service: General;  Laterality: N/A;  . Right leg surgery  age 69   As a child for  ?scleroderma per patient  . TONSILLECTOMY AND ADENOIDECTOMY    . TUBAL LIGATION    . Ureteral surgery  2011   rt ureterostomy-stent   HPI:  Pt is a 72 year old female admitted wtih LLQ pain. 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 Course was complicated by shock and acidosis. ETT 8/16-8/19 when she self-extubated. PMH: HTN, HLD, diverticulitis, DM, breast ca, CAD, afib, anxiety, AAA   Assessment / Plan / Recommendation Clinical Impression  Pt's voice sounds clear but her volitional cough seems significantly weak post-extubation. It is likely guarded, given her report of pain in her abdomen upon coughing. Intermittent coughing  was noted with cup sips of thin liquids, concerning for reduced airway protection. Recommend continuing ice chips only for now, with emphasis also on thorough oral care. Will f/u for readiness to start POs. Anticipate good prognosis for return to POs, although with weakened cough, she may also benefit from instrumental testing first. SLP Visit Diagnosis: Dysphagia, unspecified (R13.10)    Aspiration Risk  Moderate aspiration risk    Diet  Recommendation NPO;Ice chips PRN after oral care   Medication Administration: Via alternative means    Other  Recommendations Oral Care Recommendations: Oral care QID;Oral care prior to ice chip/H20   Follow up Recommendations (tba)      Frequency and Duration min 2x/week  2 weeks       Prognosis Prognosis for Safe Diet Advancement: Good      Swallow Study   General HPI: Pt is a 72 year old female admitted wtih LLQ pain. 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 Course was complicated by shock and acidosis. ETT 8/16-8/19 when she self-extubated. PMH: HTN, HLD, diverticulitis, DM, breast ca, CAD, afib, anxiety, AAA Type of Study: Bedside Swallow Evaluation Previous Swallow Assessment: none in chart Diet Prior to this Study: NPO(except ice chips) Temperature Spikes Noted: No Respiratory Status: Nasal cannula History of Recent Intubation: Yes Length of Intubations (days): 2 days Date extubated: 07/28/18(self-extubated) Behavior/Cognition: Alert;Cooperative;Requires cueing Oral Cavity Assessment: Within Functional Limits Oral Care Completed by SLP: Recent completion by staff Oral Cavity - Dentition: Missing dentition(upper dentures, not in place) Vision: Functional for self-feeding Self-Feeding Abilities: Needs assist Patient Positioning: Upright in bed Baseline Vocal Quality: Normal Volitional Cough: Weak(guarded) Volitional Swallow: Able to elicit    Oral/Motor/Sensory Function Overall Oral Motor/Sensory Function: Within functional limits   Ice Chips Ice chips: Within functional limits Presentation: Spoon   Thin Liquid Thin Liquid: Impaired Presentation: Cup;Self Fed;Spoon Pharyngeal  Phase Impairments: Cough - Immediate    Nectar Thick Nectar Thick Liquid: Not tested   Honey Thick Honey Thick Liquid: Not tested   Puree Puree: Not tested   Solid     Solid: Not tested       Germain Osgood 07/28/2018,2:02 PM  Germain Osgood, M.A. CCC-SLP [phone]

## 2018-07-28 NOTE — Progress Notes (Signed)
Wasted 225 cc of fentanyl in the sink witnessed by Sandford Craze RN

## 2018-07-29 ENCOUNTER — Ambulatory Visit: Payer: Medicare Other | Admitting: Family Medicine

## 2018-07-29 DIAGNOSIS — I4891 Unspecified atrial fibrillation: Secondary | ICD-10-CM

## 2018-07-29 DIAGNOSIS — I10 Essential (primary) hypertension: Secondary | ICD-10-CM

## 2018-07-29 DIAGNOSIS — R1310 Dysphagia, unspecified: Secondary | ICD-10-CM

## 2018-07-29 LAB — CULTURE, BLOOD (ROUTINE X 2)
Culture: NO GROWTH
Culture: NO GROWTH
Special Requests: ADEQUATE
Special Requests: ADEQUATE

## 2018-07-29 LAB — CBC WITH DIFFERENTIAL/PLATELET
Abs Immature Granulocytes: 0.3 10*3/uL — ABNORMAL HIGH (ref 0.0–0.1)
BASOS ABS: 0.1 10*3/uL (ref 0.0–0.1)
BASOS PCT: 1 %
EOS ABS: 0 10*3/uL (ref 0.0–0.7)
Eosinophils Relative: 0 %
HCT: 29.2 % — ABNORMAL LOW (ref 36.0–46.0)
Hemoglobin: 9.6 g/dL — ABNORMAL LOW (ref 12.0–15.0)
IMMATURE GRANULOCYTES: 2 %
Lymphocytes Relative: 12 %
Lymphs Abs: 1.3 10*3/uL (ref 0.7–4.0)
MCH: 27.6 pg (ref 26.0–34.0)
MCHC: 32.9 g/dL (ref 30.0–36.0)
MCV: 83.9 fL (ref 78.0–100.0)
Monocytes Absolute: 0.3 10*3/uL (ref 0.1–1.0)
Monocytes Relative: 3 %
NEUTROS ABS: 9.1 10*3/uL — AB (ref 1.7–7.7)
NEUTROS PCT: 82 %
PLATELETS: 200 10*3/uL (ref 150–400)
RBC: 3.48 MIL/uL — AB (ref 3.87–5.11)
RDW: 18.7 % — ABNORMAL HIGH (ref 11.5–15.5)
WBC: 11 10*3/uL — AB (ref 4.0–10.5)

## 2018-07-29 LAB — PHOSPHORUS: PHOSPHORUS: 2.4 mg/dL — AB (ref 2.5–4.6)

## 2018-07-29 LAB — GLUCOSE, CAPILLARY
GLUCOSE-CAPILLARY: 114 mg/dL — AB (ref 70–99)
Glucose-Capillary: 125 mg/dL — ABNORMAL HIGH (ref 70–99)
Glucose-Capillary: 114 mg/dL — ABNORMAL HIGH (ref 70–99)
Glucose-Capillary: 181 mg/dL — ABNORMAL HIGH (ref 70–99)
Glucose-Capillary: 155 mg/dL — ABNORMAL HIGH (ref 70–99)

## 2018-07-29 LAB — BASIC METABOLIC PANEL
Anion gap: 5 (ref 5–15)
BUN: 22 mg/dL (ref 8–23)
CALCIUM: 8.2 mg/dL — AB (ref 8.9–10.3)
CHLORIDE: 104 mmol/L (ref 98–111)
CO2: 29 mmol/L (ref 22–32)
CREATININE: 0.61 mg/dL (ref 0.44–1.00)
GFR calc Af Amer: >60 mL/min (ref >60)
Glucose, Bld: 136 mg/dL — ABNORMAL HIGH (ref 70–99)
Potassium: 3.7 mmol/L (ref 3.5–5.1)
SODIUM: 138 mmol/L (ref 135–145)

## 2018-07-29 LAB — MAGNESIUM: MAGNESIUM: 1.6 mg/dL — AB (ref 1.7–2.4)

## 2018-07-29 LAB — HEPARIN LEVEL (UNFRACTIONATED)
HEPARIN UNFRACTIONATED: 0.24 [IU]/mL — AB (ref 0.30–0.70)
Heparin Unfractionated: 0.38 IU/mL (ref 0.30–0.70)

## 2018-07-29 MED ORDER — FUROSEMIDE 10 MG/ML IJ SOLN: 40.0000 mg | Freq: Three times a day (TID) | INTRAMUSCULAR | Status: DC

## 2018-07-29 MED ORDER — DOFETILIDE 250 MCG PO CAPS
250.0000 ug | ORAL_CAPSULE | Freq: Two times a day (BID) | ORAL | Status: DC
  Administered 2018-07-29 – 2018-08-06 (×16): 250 ug via ORAL
  Filled 2018-07-29 (×18): qty 1

## 2018-07-29 MED ORDER — MAGNESIUM SULFATE 2 GM/50ML IV SOLN
2.0000 g | Freq: Once | INTRAVENOUS | Status: AC
  Administered 2018-07-29: 2 g via INTRAVENOUS
  Filled 2018-07-29: qty 50

## 2018-07-29 MED ORDER — POTASSIUM PHOSPHATES 15 MMOLE/5ML IV SOLN
30.0000 mmol | Freq: Once | INTRAVENOUS | Status: AC
  Administered 2018-07-29: 30 mmol via INTRAVENOUS
  Filled 2018-07-29: qty 10

## 2018-07-29 MED ORDER — DILTIAZEM 12 MG/ML ORAL SUSPENSION
90.0000 mg | Freq: Four times a day (QID) | ORAL | Status: DC
  Administered 2018-07-29 – 2018-08-02 (×16): 90 mg via ORAL
  Filled 2018-07-29 (×18): qty 9

## 2018-07-29 MED ORDER — FUROSEMIDE 10 MG/ML IJ SOLN
20.0000 mg | Freq: Three times a day (TID) | INTRAMUSCULAR | Status: AC
  Administered 2018-07-29 (×2): 20 mg via INTRAVENOUS
  Filled 2018-07-29 (×2): qty 2

## 2018-07-29 MED ORDER — SODIUM CHLORIDE 0.9 % IV SOLN
INTRAVENOUS | Status: DC
  Administered 2018-07-29 – 2018-07-31 (×2): via INTRAVENOUS

## 2018-07-29 NOTE — Progress Notes (Signed)
[place] for Heparin Indication: atrial fibrillation  Allergies  Allergen Reactions  . Miralax [Polyethylene Glycol] Swelling and Rash    Took [facility] brand developed rash. Patient states she tolerated name brand MiraLax in the past.  09/01/15. Patient states she had diffuse swelling including swelling of her lips.   . Vancomycin Rash and Shortness Of Breath  . Acetaminophen Hives  . Oxycodone-Acetaminophen Itching  . Sulfa Antibiotics Rash    All over rash  . Sulfacetamide Sodium Rash    All over rash  . Sulfasalazine Rash    All over rash All over rash  . Banana Other (See Comments)    Unknown  . Latex Rash  . Tape Rash    Patient Measurements: Height: 5\' 8"  (172.7 cm) Weight: 180 lb 1.9 oz (81.7 kg) IBW/kg (Calculated) : 63.9 Heparin Dosing Weight: 76  Vital Signs: Temp: 96.8 F (36 C) (08/20 1124) Temp Source: Axillary (08/20 1124) BP: 132/98 (08/20 1200) Pulse Rate: 101 (08/20 1000)  Labs: Recent Labs    07/27/18 0452 07/28/18 0409 07/28/18 1717 07/28/18 2029 07/29/18 0414 07/29/18 0417 07/29/18 1155  HGB 8.2* 8.6*  --   --  9.6*  --   --   HCT 24.4* 25.6*  --   --  29.2*  --   --   PLT 143* 164  --   --  200  --   --   HEPARINUNFRC  --   --  0.39  --   --  0.24* 0.38  CREATININE  --  0.64  --  0.63 0.61  --   --   TROPONINI 0.03*  --   --   --   --   --   --     Estimated Creatinine Clearance: 69.2 mL/min (by C-G formula based on SCr of 0.61 mg/dL).   Medical History: Past Medical History:  Diagnosis Date  . AAA (abdominal aortic aneurysm) ([place])   . Acute diverticulitis   . Anxiety   . Arthritis   . Atrial fibrillation ([place])    a. Dx 03/2011 - on tikosyn/coumadin.  Marland Kitchen CAD (coronary artery disease)    a. NSTEMI 02/2011: occ mid Cx, DES to OM2, residual nonobst LAD dz.  . Cancer ([place])    breast  . Diabetes mellitus   . Diverticulitis   . Diverticulitis of colon     2008, 04/2011, 12/2014, 08/2015  . Foot  deformity 1947   right; ??scleroderma  . Hemangioma    liver  . HLD (hyperlipidemia)   . HTN (hypertension)   . Osteoporosis   . Tobacco abuse    stopped smoking 2012  . Ureteral obstruction    History of gross hematuria/right hydronephrosis 2/2 to uteropelvic junction obstruction, s/p cystoscopy in January 2007 with bilateral retrograde pyelography, right ureter arthroscopy, right ureteral stent placement, bladder biopsies, stent removal since then.  . Wears dentures    top    Medications:    Assessment: 72 yo F on warfarin and dofetilide PTA for afib. Admitted on 8/15 with abdominal pain and found to have bowel perforation now s/p colectomy and colostomy replacement. Transferred to the ICU postoperatively. Heparin started for anticoagulation in place of PTA warfarin.   Heparin level is at goal (0.38), H&H 9.6/29.2, plts 200. no bleeding issues noted, no dose adjustments needed.   Goal of Therapy:  Heparin level 0.3-0.7 units/ml Monitor platelets by anticoagulation protocol: Yes   Plan:  Continue Heparin infusion at 1250 units/hr  Continue to monitor CBC, heparin level, s/s of bleeding daily  Harrietta Guardian, PharmD PGY1 Pharmacy Resident 07/29/2018    12:40 PM

## 2018-07-29 NOTE — Progress Notes (Signed)
PULMONARY / CRITICAL CARE MEDICINE   Name: Melody Casey MRN: [id number] DOB: [birth year]    ADMISSION DATE:  07/24/2018 CONSULTATION DATE:  8/16  REFERRING MD:  Dr. Sloan Leiter  CHIEF COMPLAINT:  Bowel perforation  brief  Patient is encephalopathic and/or intubated. Therefore history has been obtained from chart review. 72 year old female with PMH as below, which is significant for diverticulitis, atrial fibrillation on warfarin and Tikosyn, coronary artery disease status post stenting, diabetes, breast cancer, and hypertension.  She presented to [facility] emergency department on 8/15 with complaints of recent constipation and more recent left lower quadrant pain.  This pain began to radiate to the periumbilical area which is what caused her to present.  CT scan of the abdomen in the emergency department demonstrated a focal perforation of the anterior sigmoid wall of the colon and pneumoperitoneum.  She was admitted for stabilization and reversal of warfarin prior to being taken to the operating room on 8/16 where she underwent colectomy with colostomy placement.  Operative course complicated by shock and acidosis.  Was started on pressors and was postoperatively transferred to the ICU for further monitoring.  LINES/TUBES: ETT 8/16 >8/18 Art line 8/16>8/18 RIJ CVL 8/16 >  EVENTS 07/24/2018  Admit 07/25/18 -Perforated sigmoid colon: secondary to acute diverticulitis vs. S/p colectomy with colostomy placement 8/16.  - Dr Donne Hazel  8/17 - on vent and pressors but coming down on neo. On fent gtt. Making urine but is dark. NPO. CCS ok with givingin lovenox for dvt proph tonight. On bic gtt at 50cc/h On fent gtt  SUBJECTIVE/OVERNIGHT/INTERVAL HX No events overnight, continues to be on cardizem drip  VITAL SIGNS: BP 130/72 (BP Location: Left Arm)   Pulse 97   Temp 97.8 F (36.6 C) (Oral)   Resp 17   Ht 5\' 8"  (1.727 m)   Wt 81.7 kg   SpO2 94%   BMI 27.39 kg/m   HEMODYNAMICS: CVP:  [3  mmHg] 3 mmHg  VENTILATOR SETTINGS:    INTAKE / OUTPUT: I/O last 3 completed shifts: In: 2528.1 [I.V.:1051.1; NG/GT:30; IV Piggyback:1447.1] Out: 1448 [Urine:5900; Drains:115]  PHYSICAL EXAMINATION: General: Chronically ill appearing, NAD HEENT: [place]/AT, PERRL, EOM-I and MMM Hrt: RRR, Nl S1/S2 and -M/R/G Lung: CTA bilaterally Abd: Soft, mildly tender diffusely, ND and +BS Ext: -edema and -tednerness Skin: wound noted, otherwise intact  PULMONARY Recent Labs  Lab 07/25/18 1401 07/25/18 1615 07/25/18 2221 07/26/18 1059 07/26/18 1350  PHART 7.191* 7.255* 7.352 7.354 7.361  PCO2ART 48.5* 46.5 40.0 36.2 37.4  PO2ART 178.0* 116.0* 255.0* 83.0 74.0*  HCO3 18.5* 20.6 22.2 20.2 21.2  TCO2 20* 22 23 21* 22  O2SAT 99.0 98.0 100.0 96.0 94.0   CBC Recent Labs  Lab 07/27/18 0452 07/28/18 0409 07/29/18 0414  HGB 8.2* 8.6* 9.6*  HCT 24.4* 25.6* 29.2*  WBC 7.4 8.4 11.0*  PLT 143* 164 200   COAGULATION Recent Labs  Lab 07/24/18 0944 07/24/18 1856 07/25/18 0349  INR 3.01 1.26 1.23   CARDIAC   Recent Labs  Lab 07/25/18 1403 07/27/18 0452  TROPONINI <0.03 0.03*   No results for input(s): PROBNP in the last 168 hours.  CHEMISTRY Recent Labs  Lab 07/25/18 1403 07/26/18 0311 07/27/18 0009 07/27/18 0452 07/28/18 0409 07/28/18 2029 07/29/18 0414  NA 138 136 137  --  142 140 138  K 3.9 3.8 3.6  --  2.9* 3.3* 3.7  CL 107 105 105  --  106 102 104  CO2 22 22 24   --  28 30 29   GLUCOSE 102* 115* 226*  --  176* 127* 136*  BUN 45* 42* 43*  --  26* 21 22  CREATININE 0.98 0.88 0.45  --  0.64 0.63 0.61  CALCIUM 8.1* 9.0 9.2  --  8.5* 8.4* 8.2*  MG 1.8 2.4  --  2.0 1.4*  --  1.6*  PHOS 4.5  --   --  2.0* 1.8*  --  2.4*   Estimated Creatinine Clearance: 69.2 mL/min (by C-G formula based on SCr of 0.61 mg/dL).  LIVER Recent Labs  Lab 07/22/18 1632 07/24/18 0944 07/24/18 1856 07/25/18 0349 07/26/18 0311  AST 14 20  --  26 26  ALT 10 15  --  19 15  ALKPHOS 110 120   --  98 66  BILITOT 0.9 1.8*  --  2.7* 3.1*  PROT 6.5 6.8  --  5.4* 4.7*  ALBUMIN 3.5 2.7*  --  2.1* 2.0*  INR  --  3.01 1.26 1.23  --    INFECTIOUS Recent Labs  Lab 07/24/18 1313 07/25/18 1403 07/27/18 0009  LATICACIDVEN 2.2* 1.2 1.3   ENDOCRINE CBG (last 3)  Recent Labs    07/28/18 1938 07/28/18 2349 07/29/18 0730  GLUCAP 117* 143* 114*   IMAGING x48h  - image(s) personally visualized  -   highlighted in bold Dg Chest Port 1 View  Result Date: 07/27/2018 CLINICAL DATA:  Respiratory failure. EXAM: PORTABLE CHEST 1 VIEW COMPARISON:  07/27/2018 and prior radiographs FINDINGS: The cardiomediastinal silhouette is unchanged. An endotracheal tube with tip 5.5 cm above the carina, NG tube entering the stomach and RIGHT IJ central venous catheter with tip overlying the LOWER SVC again noted. Bilateral LOWER lung atelectasis/airspace disease, LEFT-greater-than-RIGHT, again noted. Small LEFT effusion again noted. There is no evidence of pneumothorax. IMPRESSION: Little significant change with support apparatus as described. Electronically Signed   By: Margarette Canada M.D.   On: 07/27/2018 11:34   SIGNIFICANT EVENTS: ASSESSMENT / PLAN:  PULMONARY A: Baseline: prior smoking hx ( reports that she quit smoking about 7 years ago. Her smoking use included cigarettes. She has a 100.00 pack-year smoking history. She has never used smokeless tobacco. ). AT risk COPD  Current: -  Acute post op resp failure following p lap -  - 86mm LLL nodule on CT at admit  07/24/2018  07/29/2018 -> 07/29/2018 - > does YES  meet criteria for SBT but NOT for Extubation in setting of Acute Respiratory Failure due to deconditioning, volume overload  P:   Titrate O2 for sat of 88-92% Monitor for airway protection given dysphagia D/C fentanyl drip of MAR and change to PRN Sips and chips for now, awaiting SLP to clarify further Continue BD for presumed copd Lasix 20 mg IV q8 x2 doses OPD CT scan needed in feb 2020 for  pulmonary nodule, to be followed up as outpatient  CARDIOVASCULAR A:  Baseline: A Fib PAF and CAD s/p PCI in [address] Current  sinus  07/29/2018 -> holding warfarin and tikosyn. Off neo following septic shock. On DVT proph with lovenox. > 11L overload. On Cardizem gtt for A Fib - and controlled   P:  MAP goal > 65 Continue cardizem gtt but D/C once PO takes effect Add cardizem suspension at 90 mg PO QID with holding parameters and D/C IV Will need to determine when to restart home Tykosin Heparin drip per pharmacy tof a-fib, will need PO anti-coagulation once more stable from a surgical and abdominal standpoint  RENAL  Intake/Output Summary (Last 24 hours) at 07/29/2018 0854 Last data filed at 07/29/2018 0800 Gross per 24 hour  Intake 1937.32 ml  Output 2385 ml  Net -447.68 ml   Recent Labs  Lab 07/26/18 0311 07/27/18 0009 07/28/18 0409 07/28/18 2029 07/29/18 0414  CREATININE 0.88 0.45 0.64 0.63 0.61   A:   Current AKI at admit  07/29/2018 -> resolved as of 07/25/18. > 11L volume overload +. Mild Low Phos +  P:   Replete K, Mg and Phos again today Lasix 20 mg IV q8 x2 doses Monitor KVO IVF BMET in AM  GASTROINTESTINAL A:   S/p P lap for per diverticulitius 07/25/18  P:   SLP again toda Rest per CCS Sips and chips, meds ok per surgery  HEMATOLOGIC Recent Labs  Lab 07/27/18 0452 07/28/18 0409 07/29/18 0414  HGB 8.2* 8.6* 9.6*  HCT 24.4* 25.6* 29.2*  WBC 7.4 8.4 11.0*  PLT 143* 164 200   A:   RBC: anemia critical illnes Platelet norma WBC normal  P:  CBC in AM Transfuse per ICU protocol  INFECTIOUS A:   Septic shock due to peritonitis P:   Continue zosyn, last day 8/21 (stop date in place)  ENDOCRINE A:   At risk hypregluycemia P:   ICU hyperglycemia protocol  NEUROLOGIC A:   On xanax at home Confused this AM P:   Change fentanyl to PRN for pain PRN xanax ordered  FAMILY  - Updates: Patient updated bedside  - Inter-disciplinary  family meet or Palliative Care meeting due by:  DAy 7. Current LOS is LOS 5 days   DISPO Continue to hold in the ICU given need for drips, if improves and able to get off drips then will consider a SDU transfer  The patient is critically ill with multiple organ systems failure and requires high complexity decision making for assessment and support, frequent evaluation and titration of therapies, application of advanced monitoring technologies and extensive interpretation of multiple databases.   Critical Care Time devoted to patient care services described in this note is  32  Minutes. This time reflects time of care of this signee Dr Jennet Maduro. This critical care time does not reflect procedure time, or teaching time or supervisory time of PA/NP/Med student/Med Resident etc but could involve care discussion time.  Rush Farmer, M.D. [facility] Pulmonary/Critical Care Medicine. Pager: [phone]. After hours pager: [phone].  07/29/2018 8:54 AM

## 2018-07-29 NOTE — Progress Notes (Signed)
  Speech Language Pathology Treatment: Dysphagia  Patient Details Name: HEYDI SWANGO MRN: [id number] DOB: 05/09/1946 Today's Date: 07/29/2018 Time: 2583-4621 SLP Time Calculation (min) (ACUTE ONLY): 13 min  Assessment / Plan / Recommendation Clinical Impression  Pt's voice sounds stronger today and only mildly hoarse. She and her husband believe it is not at baseline, but not far from it. She consumed thin liquids via cup and straw with no overt signs of difficulty, and takes small sips with Mod I. Recommended clear liquid diet, as cleared by MD. Will follow for tolerance and ability to advance solid foods as cleared by medical team.   HPI HPI: Pt is a 72 year old female admitted wtih LLQ pain. 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 Course was complicated by shock and acidosis. ETT 8/16-8/19 when she self-extubated. PMH: HTN, HLD, diverticulitis, DM, breast ca, CAD, afib, anxiety, AAA      SLP Plan  Continue with current plan of care       Recommendations  Diet recommendations: Thin liquid(clear liquid diet) Liquids provided via: Cup;Straw Medication Administration: Whole meds with liquid Supervision: Patient able to self feed;Intermittent supervision to cue for compensatory strategies Compensations: Slow rate;Small sips/bites Postural Changes and/or Swallow Maneuvers: Seated upright 90 degrees                Oral Care Recommendations: Oral care BID Follow up Recommendations: (tba) SLP Visit Diagnosis: Dysphagia, unspecified (R13.10) Plan: Continue with current plan of care       GO                Germain Osgood 07/29/2018, 12:11 PM  Germain Osgood, M.A. CCC-SLP [phone]

## 2018-07-29 NOTE — Telephone Encounter (Signed)
Currently admitted.

## 2018-07-29 NOTE — Evaluation (Signed)
Physical Therapy Evaluation Patient Details Name: Melody Casey MRN: [id number] DOB: 08/12/1946 Today's Date: 07/29/2018   History of Present Illness  Pt is a 72 y.o. female admitted 07/24/18 with LLQ. 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 Course was complicated by shock and acidosis. ETT 8/16-8/19; self-extubated. PMH: HTN, HLD, diverticulitis, DM, breast ca, CAD, afib, anxiety, AAA.    Clinical Impression  Pt presents with an overall decrease in functional mobility secondary to above. PTA, pt indep and lives with husband; enjoys walking and staying active. Today, pt able to stand and transfer from recliner back to bed with +2 assist and St[place]; limited by abdominal pain. Hopeful pt will progress well with acute therapies and be able to tolerate intensive CIR-level therapies. Pt motivated to participate and has supportive husband. Will follow acutely to address established goals.    Follow Up Recommendations CIR;Supervision for mobility/OOB    Equipment Recommendations  (TBD next venue)    Recommendations for Other Services OT consult;Rehab consult     Precautions / Restrictions Precautions Precautions: Fall Precaution Comments: Colostomy, abdominal drain Restrictions Weight Bearing Restrictions: No      Mobility  Bed Mobility Overal bed mobility: Needs Assistance Bed Mobility: Sit to Sidelying;Rolling Rolling: Min assist       Sit to sidelying: Mod assist General bed mobility comments: ModA to assist BLEs into bed. Pt able to reposition hip and trunks with minA. Limited by pain  Transfers Overall transfer level: Needs assistance   Transfers: Sit to/from Stand Sit to Stand: +2 physical assistance;+2 safety/equipment;Mod assist;Max assist         General transfer comment: MaxA+2 to assist trunk elevation standing from recliner into [place]; pt able to assist with BUEs.  ModA+2 standing from elevated Steady height in order to sit back onto bed  Ambulation/Gait             General Gait Details: Deferred secondary to fatigue and amount of assist required  Stairs            Wheelchair Mobility    Modified Rankin (Stroke Patients Only)       Balance Overall balance assessment: Needs assistance   Sitting balance-Leahy Scale: Fair Sitting balance - Comments: Reliant on UE support to pull trunk into sitting upright in recliner     Standing balance-Leahy Scale: Zero                               Pertinent Vitals/Pain Pain Assessment: Faces Faces Pain Scale: Hurts even more Pain Location: abdomen with mobility Pain Descriptors / Indicators: Grimacing;Sore Pain Intervention(s): Monitored during session;Limited activity within patient's tolerance;Repositioned    Home Living Family/patient expects to be discharged to:: Private residence Living Arrangements: Spouse/significant other Available Help at Discharge: Family;Available 24 hours/day Type of Home: House Home Access: Stairs to enter Entrance Stairs-Rails: Right;Left;Can reach both Entrance Stairs-Number of Steps: 3 Home Layout: One level Home Equipment: Walker - 2 wheels      Prior Function Level of Independence: Independent         Comments: Enjoys walking     Hand Dominance        Extremity/Trunk Assessment   Upper Extremity Assessment Upper Extremity Assessment: Generalized weakness    Lower Extremity Assessment Lower Extremity Assessment: Generalized weakness;RLE deficits/detail;LLE deficits/detail RLE Deficits / Details: RLE atrophy (pt reports baseline and does not give  her issue); R calf pain/tenderness with palpation (RN aware and to have MD assess again for DVT/etc.) LLE Deficits / Details: Swollen. knee flex/ext 3/5, hip flex 3/5 (limited by abdominal pain) LLE: Unable to fully assess due to pain LLE Coordination: decreased fine  motor;decreased gross motor       Communication   Communication: No difficulties  Cognition Arousal/Alertness: Awake/alert Behavior During Therapy: WFL for tasks assessed/performed Overall Cognitive Status: Within Functional Limits for tasks assessed                                 General Comments: Apparently mental status much improved today (8/20)      General Comments General comments (skin integrity, edema, etc.): Husband present throughout session and supportive    Exercises     Assessment/Plan    PT Assessment Patient needs continued PT services  PT Problem List Decreased strength;Decreased activity tolerance;Decreased balance;Decreased mobility;Decreased knowledge of use of DME;Cardiopulmonary status limiting activity;Decreased knowledge of precautions;Pain       PT Treatment Interventions DME instruction;Gait training;Stair training;Functional mobility training;Therapeutic activities;Therapeutic exercise;Balance training;Patient/family education    PT Goals (Current goals can be found in the Care Plan section)  Acute Rehab PT Goals Patient Stated Goal: "Get back to walking like I enjoy" PT Goal Formulation: With patient Time For Goal Achievement: 08/12/18 Potential to Achieve Goals: Good    Frequency Min 3X/week   Barriers to discharge        Co-evaluation               AM-PAC PT "6 Clicks" Daily Activity  Outcome Measure Difficulty turning over in bed (including adjusting bedclothes, sheets and blankets)?: A Little Difficulty moving from lying on back to sitting on the side of the bed? : Unable Difficulty sitting down on and standing up from a chair with arms (e.g., wheelchair, bedside commode, etc,.)?: Unable Help needed moving to and from a bed to chair (including a wheelchair)?: Total Help needed walking in hospital room?: Total Help needed climbing 3-5 steps with a railing? : Total 6 Click Score: 8    End of Session Equipment  Utilized During Treatment: Gait belt;Oxygen Activity Tolerance: Patient tolerated treatment well;Patient limited by fatigue Patient left: in bed;with call bell/phone within reach;with bed alarm set;with family/visitor present Nurse Communication: Mobility status;Need for lift equipment PT Visit Diagnosis: Other abnormalities of gait and mobility (R26.89);Muscle weakness (generalized) (M62.81)    Time: 9629-5284 PT Time Calculation (min) (ACUTE ONLY): 21 min   Charges:   PT Evaluation $PT Eval Moderate Complexity: 1 Mod         Mabeline Caras, PT, DPT Acute Rehab Services  Pager: [place] 07/29/2018, 12:53 PM

## 2018-07-29 NOTE — Progress Notes (Signed)
[facility]/Trauma Progress Note  4 Days Post-Op   Assessment/Plan Principal Problem:   Sepsis ([name]) Active Problems:   Type 2 diabetes mellitus with complication, without long-term current use of insulin (HCC)   CAD (coronary artery disease)   Anxiety and depression   AAA (abdominal aortic aneurysm) without rupture (HCC)   Microcytic anemia   Diverticulosis   Acute diverticulitis of intestine   Bowel perforation (HCC)   Hyponatremia  Bowel perforation - S/P Hartman's (left colectomy, end transverse colostomy), Dr. Donne Hazel, 08/16 - await bowel function, allow ice chips and sips with meds, did not pass swallow - pt self extubated this am, doing well on [place]  FEN: NPO, ice chips  VTE: SCD's, lovenox ID: Zosyn 08/15>> Follow up: Dr. Donne Hazel    LOS: 5 days    Subjective: CC: abdominal pain  No nausea or vomiting. No issues overnight. Pt states she was told she has another surgery pending. I discussed no further surgery at this time.   Objective: Vital signs in last 24 hours: Temp:  [97.8 F (36.6 C)-98.8 F (37.1 C)] 97.8 F (36.6 C) (08/20 0731) Pulse Rate:  [54-114] 97 (08/20 0700) Resp:  [14-30] 16 (08/20 0700) BP: (106-146)/(61-87) 115/74 (08/20 0700) SpO2:  [87 %-97 %] 94 % (08/20 0700) Weight:  [81.7 kg] 81.7 kg (08/20 0600) Last BM Date: 07/24/18  Intake/Output from previous day: 08/19 0701 - 08/20 0700 In: 1966.2 [I.V.:733.6; IV Piggyback:1232.6] Out: 3385 [Urine:3375; Drains:10] Intake/Output this shift: No intake/output data recorded.  PE: Gen:  Alert, NAD Pulm:  Rate and effort normal Abd: Soft, ND, + BS, no output in ostomy but stoma pink, drain with serous drainage, appropriately tender, midline without purulent drainage (see below) Skin: no rashes noted, warm and dry      Anti-infectives: Anti-infectives (From admission, onward)   Start     Dose/Rate Route Frequency Ordered Stop   07/24/18 1800  piperacillin-tazobactam  (ZOSYN) IVPB 3.375 g     3.375 g 12.5 mL/hr over 240 Minutes Intravenous Every 8 hours 07/24/18 1725 07/31/18 0000   07/24/18 1045  cefTRIAXone (ROCEPHIN) 2 g in sodium chloride 0.9 % 100 mL IVPB  Status:  Discontinued     2 g 200 mL/hr over 30 Minutes Intravenous Every 24 hours 07/24/18 1044 07/24/18 1722   07/24/18 1045  metroNIDAZOLE (FLAGYL) IVPB 500 mg  Status:  Discontinued     500 mg 100 mL/hr over 60 Minutes Intravenous Every 8 hours 07/24/18 1044 07/24/18 1722      Lab Results:  Recent Labs    07/28/18 0409 07/29/18 0414  WBC 8.4 11.0*  HGB 8.6* 9.6*  HCT 25.6* 29.2*  PLT 164 200   BMET Recent Labs    07/28/18 2029 07/29/18 0414  NA 140 138  K 3.3* 3.7  CL 102 104  CO2 30 29  GLUCOSE 127* 136*  BUN 21 22  CREATININE 0.63 0.61  CALCIUM 8.4* 8.2*   PT/INR No results for input(s): LABPROT, INR in the last 72 hours. CMP     Component Value Date/Time   NA 138 07/29/2018 0414   NA 132 (L) 07/22/2018 1632   NA 134 (L) 06/06/2017 1259   K 3.7 07/29/2018 0414   K 4.5 06/06/2017 1259   CL 104 07/29/2018 0414   CO2 29 07/29/2018 0414   CO2 24 06/06/2017 1259   GLUCOSE 136 (H) 07/29/2018 0414   GLUCOSE 311 (H) 06/06/2017 1259   BUN 22 07/29/2018 0414   BUN 32 (  H) 07/22/2018 1632   BUN 18.3 06/06/2017 1259   CREATININE 0.61 07/29/2018 0414   CREATININE 1.0 06/06/2017 1259   CALCIUM 8.2 (L) 07/29/2018 0414   CALCIUM 10.6 (H) 06/06/2017 1259   PROT 4.7 (L) 07/26/2018 0311   PROT 6.5 07/22/2018 1632   PROT 7.7 06/06/2017 1259   ALBUMIN 2.0 (L) 07/26/2018 0311   ALBUMIN 3.5 07/22/2018 1632   ALBUMIN 3.3 (L) 06/06/2017 1259   AST 26 07/26/2018 0311   AST 17 06/06/2017 1259   ALT 15 07/26/2018 0311   ALT 14 06/06/2017 1259   ALKPHOS 66 07/26/2018 0311   ALKPHOS 115 06/06/2017 1259   BILITOT 3.1 (H) 07/26/2018 0311   BILITOT 0.9 07/22/2018 1632   BILITOT 0.52 06/06/2017 1259   GFRNONAA >60 07/29/2018 0414   GFRNONAA 80 06/22/2015 1614   GFRAA >60  07/29/2018 0414   GFRAA >89 06/22/2015 1614   Lipase     Component Value Date/Time   LIPASE 24 07/24/2018 0944    Studies/Results: Dg Chest Port 1 View  Result Date: 07/27/2018 CLINICAL DATA:  Respiratory failure. EXAM: PORTABLE CHEST 1 VIEW COMPARISON:  07/27/2018 and prior radiographs FINDINGS: The cardiomediastinal silhouette is unchanged. An endotracheal tube with tip 5.5 cm above the carina, NG tube entering the stomach and RIGHT IJ central venous catheter with tip overlying the LOWER SVC again noted. Bilateral LOWER lung atelectasis/airspace disease, LEFT-greater-than-RIGHT, again noted. Small LEFT effusion again noted. There is no evidence of pneumothorax. IMPRESSION: Little significant change with support apparatus as described. Electronically Signed   By: Margarette Canada M.D.   On: 07/27/2018 11:34      Kalman Drape , [facility] 07/29/2018, 7:55 AM  Pager: [phone] Mon-Wed, Friday 7:00am-4:30pm Thurs 7am-11:30am  Consults: [phone]

## 2018-07-29 NOTE — Progress Notes (Signed)
Rehab Admissions Coordinator Note:  Per PT, Patient was screened by Jhonnie Garner for appropriateness for an Inpatient Acute Rehab Consult.  At this time, we are recommending Inpatient Rehab consult and OT evaluation. AC will contact MD regarding IP rehab consult order.   Jhonnie Garner 07/29/2018, 2:29 PM  I can be reached at [phone].

## 2018-07-29 NOTE — Progress Notes (Signed)
[facility] for Heparin Indication: atrial fibrillation  Allergies  Allergen Reactions  . Miralax [Polyethylene Glycol] Swelling and Rash    Took [facility] brand developed rash. Patient states she tolerated name brand MiraLax in the past.  09/01/15. Patient states she had diffuse swelling including swelling of her lips.   . Vancomycin Rash and Shortness Of Breath  . Acetaminophen Hives  . Oxycodone-Acetaminophen Itching  . Sulfa Antibiotics Rash    All over rash  . Sulfacetamide Sodium Rash    All over rash  . Sulfasalazine Rash    All over rash All over rash  . Banana Other (See Comments)    Unknown  . Latex Rash  . Tape Rash    Patient Measurements: Height: 5\' 8"  (172.7 cm) Weight: 167 lb 1.7 oz (75.8 kg) IBW/kg (Calculated) : 63.9 Heparin Dosing Weight: 76  Vital Signs: Temp: 97.9 F (36.6 C) (08/20 0400) Temp Source: Oral (08/20 0400) BP: 132/81 (08/20 0400) Pulse Rate: 93 (08/20 0400)  Labs: Recent Labs    07/27/18 0009  07/27/18 0452 07/28/18 0409 07/28/18 1717 07/28/18 2029 07/29/18 0414 07/29/18 0417  HGB  --    < > 8.2* 8.6*  --   --  9.6*  --   HCT  --   --  24.4* 25.6*  --   --  29.2*  --   PLT  --   --  143* 164  --   --  200  --   HEPARINUNFRC  --   --   --   --  0.39  --   --  0.24*  CREATININE 0.45  --   --  0.64  --  0.63  --   --   TROPONINI  --   --  0.03*  --   --   --   --   --    < > = values in this interval not displayed.    Estimated Creatinine Clearance: 62.2 mL/min (by C-G formula based on SCr of 0.63 mg/dL).  Assessment: 72 yo female s/p colectomy 8/16, h/o Afib and Coumadin on hold, for heparin  Goal of Therapy:  Heparin level 0.3-0.7 units/ml Monitor platelets by anticoagulation protocol: Yes   Plan:  Increase Heparin 1250 units/hr  Phillis Knack, PharmD, BCPS  07/29/2018 5:36 AM

## 2018-07-30 LAB — BASIC METABOLIC PANEL
ANION GAP: 6 (ref 5–15)
BUN: 17 mg/dL (ref 8–23)
CO2: 27 mmol/L (ref 22–32)
CREATININE: 0.53 mg/dL (ref 0.44–1.00)
Calcium: 7.9 mg/dL — ABNORMAL LOW (ref 8.9–10.3)
Chloride: 102 mmol/L (ref 98–111)
Glucose, Bld: 150 mg/dL — ABNORMAL HIGH (ref 70–99)
Potassium: 3 mmol/L — ABNORMAL LOW (ref 3.5–5.1)
SODIUM: 135 mmol/L (ref 135–145)

## 2018-07-30 LAB — CBC WITH DIFFERENTIAL/PLATELET
ABS IMMATURE GRANULOCYTES: 0.3 10*3/uL — AB (ref 0.0–0.1)
BASOS ABS: 0 10*3/uL (ref 0.0–0.1)
Basophils Relative: 0 %
Eosinophils Absolute: 0.1 10*3/uL (ref 0.0–0.7)
Eosinophils Relative: 1 %
HEMATOCRIT: 30.2 % — AB (ref 36.0–46.0)
HEMOGLOBIN: 10 g/dL — AB (ref 12.0–15.0)
Immature Granulocytes: 3 %
LYMPHS ABS: 1.2 10*3/uL (ref 0.7–4.0)
LYMPHS PCT: 13 %
MCH: 27.8 pg (ref 26.0–34.0)
MCHC: 33.1 g/dL (ref 30.0–36.0)
MCV: 83.9 fL (ref 78.0–100.0)
Monocytes Absolute: 0.2 10*3/uL (ref 0.1–1.0)
Monocytes Relative: 2 %
NEUTROS ABS: 7.7 10*3/uL (ref 1.7–7.7)
Neutrophils Relative %: 81 %
Platelets: 192 10*3/uL (ref 150–400)
RBC: 3.6 MIL/uL — AB (ref 3.87–5.11)
RDW: 18.6 % — ABNORMAL HIGH (ref 11.5–15.5)
WBC: 9.5 10*3/uL (ref 4.0–10.5)

## 2018-07-30 LAB — GLUCOSE, CAPILLARY
GLUCOSE-CAPILLARY: 153 mg/dL — AB (ref 70–99)
GLUCOSE-CAPILLARY: 149 mg/dL — AB (ref 70–99)
GLUCOSE-CAPILLARY: 129 mg/dL — AB (ref 70–99)
Glucose-Capillary: 128 mg/dL — ABNORMAL HIGH (ref 70–99)
Glucose-Capillary: 150 mg/dL — ABNORMAL HIGH (ref 70–99)
Glucose-Capillary: 119 mg/dL — ABNORMAL HIGH (ref 70–99)
Glucose-Capillary: 128 mg/dL — ABNORMAL HIGH (ref 70–99)

## 2018-07-30 LAB — RENAL FUNCTION PANEL
Albumin: 1.6 g/dL — ABNORMAL LOW (ref 3.5–5.0)
Anion gap: 7 (ref 5–15)
BUN: 16 mg/dL (ref 8–23)
CO2: 27 mmol/L (ref 22–32)
Calcium: 8.3 mg/dL — ABNORMAL LOW (ref 8.9–10.3)
Chloride: 100 mmol/L (ref 98–111)
Creatinine, Ser: 0.57 mg/dL (ref 0.44–1.00)
GFR calc Af Amer: >60 mL/min (ref >60)
GFR calc non Af Amer: >60 mL/min (ref >60)
Glucose, Bld: 165 mg/dL — ABNORMAL HIGH (ref 70–99)
Phosphorus: 2.9 mg/dL (ref 2.5–4.6)
Potassium: 3.9 mmol/L (ref 3.5–5.1)
Sodium: 134 mmol/L — ABNORMAL LOW (ref 135–145)

## 2018-07-30 LAB — PHOSPHORUS: PHOSPHORUS: 2.2 mg/dL — AB (ref 2.5–4.6)

## 2018-07-30 LAB — MAGNESIUM: MAGNESIUM: 1.6 mg/dL — AB (ref 1.7–2.4)

## 2018-07-30 LAB — HEPARIN LEVEL (UNFRACTIONATED): HEPARIN UNFRACTIONATED: 0.29 [IU]/mL — AB (ref 0.30–0.70)

## 2018-07-30 MED ORDER — POTASSIUM CHLORIDE CRYS ER 20 MEQ PO TBCR: 30.0000 meq | EXTENDED_RELEASE_TABLET | ORAL | Status: DC

## 2018-07-30 MED ORDER — POTASSIUM CHLORIDE 10 MEQ/50ML IV SOLN
10.0000 meq | INTRAVENOUS | Status: AC | PRN
  Administered 2018-07-30 (×3): 10 meq via INTRAVENOUS
  Filled 2018-07-30 (×3): qty 50

## 2018-07-30 MED ORDER — FAMOTIDINE 20 MG PO TABS
20.0000 mg | ORAL_TABLET | Freq: Two times a day (BID) | ORAL | Status: DC
  Administered 2018-07-30 – 2018-08-06 (×14): 20 mg via ORAL
  Filled 2018-07-30 (×14): qty 1

## 2018-07-30 MED ORDER — MAGNESIUM SULFATE 2 GM/50ML IV SOLN
2.0000 g | Freq: Once | INTRAVENOUS | Status: AC
  Administered 2018-07-30: 2 g via INTRAVENOUS
  Filled 2018-07-30: qty 50

## 2018-07-30 MED ORDER — ONDANSETRON HCL 4 MG/2ML IJ SOLN
4.0000 mg | INTRAMUSCULAR | Status: DC | PRN
  Administered 2018-07-30 – 2018-08-01 (×2): 4 mg via INTRAVENOUS
  Filled 2018-07-30 (×2): qty 2

## 2018-07-30 MED ORDER — POTASSIUM CHLORIDE 20 MEQ/15ML (10%) PO SOLN: 30.0000 meq | ORAL | Status: DC

## 2018-07-30 MED ORDER — POTASSIUM PHOSPHATES 15 MMOLE/5ML IV SOLN
15.0000 mmol | Freq: Four times a day (QID) | INTRAVENOUS | Status: AC
  Administered 2018-07-30 (×2): 15 mmol via INTRAVENOUS
  Filled 2018-07-30 (×2): qty 5

## 2018-07-30 NOTE — Progress Notes (Signed)
SLP Cancellation Note  Patient Details Name: Melody Casey MRN: [id number] DOB: 12/27/1945   Cancelled treatment:       Reason Eval/Treat Not Completed: Medical issues which prohibited therapy. POs being held this morning (although diet order still entered). Per surgical note, there is concern for ileus. Will f/u as able.   Germain Osgood 07/30/2018, 10:07 AM  Germain Osgood, M.A. CCC-SLP [phone]

## 2018-07-30 NOTE — Progress Notes (Signed)
PROGRESS NOTE    Melody Casey  PPJ:[id number] DOB: 11-30-1946 DOA: 07/24/2018 PCP: Dettinger, [name], MD  Outpatient Specialists:   Brief Narrative:  Patient is a 72 year old female with past medical history significant for diverticulitis, atrial fibrillation on warfarin and Tikosyn, coronary artery disease status post stenting, diabetes, breast cancer, and hypertension.  She presented to [facility] emergency department on 8/15 with complaints of recent constipation and more recent left lower quadrant pain.  This pain began to radiate to the periumbilical area which is what caused her to present.  CT scan of the abdomen in the emergency department demonstrated a focal perforation of the anterior sigmoid wall of the colon and pneumoperitoneum.  She was admitted for stabilization and reversal of warfarin prior to being taken to the operating room on 8/16 where she underwent colectomy with colostomy placement.  Operative course complicated by shock and acidosis.  Was started on pressors and was postoperatively transferred to the ICU for further monitoring.  07/30/2018: Hospitalist service assumed patient's care today.  Patient has no new complaints.  Patient is not a particularly good historian.   Assessment & Plan:   Principal Problem:   Sepsis ([place]) Active Problems:   Type 2 diabetes mellitus with complication, without long-term current use of insulin (HCC)   CAD (coronary artery disease)   Anxiety and depression   AAA (abdominal aortic aneurysm) without rupture (HCC)   Microcytic anemia   Diverticulosis   Acute diverticulitis of intestine   Bowel perforation (HCC)   Hyponatremia   Diverticulitis with perforation, as status post left colectomy and end transverse colostomy: Sigmoid perforation likely secondary to stercoral ulcer found. Postop management as per surgical team.  Paroxysmal atrial fibrillation/coronary artery disease status post PCI: Stable for now. Patient is on  Tikosyn. Cardizem added. Continue to monitor QTc interval. Prolonged QTc interval is improving with correction of abnormal electrolytes.  Acute kidney injury: Resolved.  Hypokalemia: Continue to monitor and replete.  Hypophosphatemia: Continue to monitor and replete.  Hypertension/diabetes mellitus: Continue to optimize.  DVT prophylaxis: Heparin drip Code Status: Full code Family Communication:  Disposition Plan: Will depend on hospital course   Consultants:   Surgery  Cardiology  Critical care  Procedures:   Left colectomy and colostomy  Antimicrobials:   IV Zosyn   Subjective: Poor historian. No new complaints. Patient remains critically ill.  Objective: Vitals:   07/30/18 1500 07/30/18 1600 07/30/18 1700 07/30/18 1800  BP: 113/68 135/73 124/60 (!) 125/58  Pulse: 84 84 80 80  Resp: (!) 23 17 19 16   Temp:      TempSrc:      SpO2: 93% 95% 95% 94%  Weight:      Height:        Intake/Output Summary (Last 24 hours) at 07/30/2018 1839 Last data filed at 07/30/2018 1800 Gross per 24 hour  Intake 1144.87 ml  Output 425 ml  Net 719.87 ml   Filed Weights   07/24/18 0915 07/26/18 1311 07/29/18 0600  Weight: 72.6 kg 75.8 kg 81.7 kg    Examination:  General exam: Acutely ill looking.   Respiratory system: Clear to auscultation.  Cardiovascular system: S1 & S2 heard, Gastrointestinal system: Abdomen is nondistended, soft.  Organs are difficult to assess. Central nervous system: Alert and oriented. No focal neurological deficits. Extremities: No edema   Data Reviewed: I have personally reviewed following labs and imaging studies  CBC: Recent Labs  Lab 07/25/18 1403  07/26/18 5809 07/27/18 9833 07/28/18 0409 07/29/18 0414  07/30/18 0427  WBC 2.1*   < > 8.3 7.4 8.4 11.0* 9.5  NEUTROABS 1.8  --   --  6.5 7.1 9.1* 7.7  HGB 8.1*   < > 9.1* 8.2* 8.6* 9.6* 10.0*  HCT 24.6*   < > 27.5* 24.4* 25.6* 29.2* 30.2*  MCV 84.2   < > 84.6 82.2 82.6 83.9  83.9  PLT 125*   < > 165 143* 164 200 192   < > = values in this interval not displayed.   Basic Metabolic Panel: Recent Labs  Lab 07/26/18 0311  07/27/18 0452 07/28/18 0409 07/28/18 2029 07/29/18 0414 07/30/18 0427 07/30/18 1514  NA 136   < >  --  142 140 138 135 134*  K 3.8   < >  --  2.9* 3.3* 3.7 3.0* 3.9  CL 105   < >  --  106 102 104 102 100  CO2 22   < >  --  28 30 29 27 27   GLUCOSE 115*   < >  --  176* 127* 136* 150* 165*  BUN 42*   < >  --  26* 21 22 17 16   CREATININE 0.88   < >  --  0.64 0.63 0.61 0.53 0.57  CALCIUM 9.0   < >  --  8.5* 8.4* 8.2* 7.9* 8.3*  MG 2.4  --  2.0 1.4*  --  1.6* 1.6*  --   PHOS  --   --  2.0* 1.8*  --  2.4* 2.2* 2.9   < > = values in this interval not displayed.   GFR: Estimated Creatinine Clearance: 69.2 mL/min (by C-G formula based on SCr of 0.57 mg/dL). Liver Function Tests: Recent Labs  Lab 07/24/18 0944 07/25/18 0349 07/26/18 0311 07/30/18 1514  AST 20 26 26   --   ALT 15 19 15   --   ALKPHOS 120 98 66  --   BILITOT 1.8* 2.7* 3.1*  --   PROT 6.8 5.4* 4.7*  --   ALBUMIN 2.7* 2.1* 2.0* 1.6*   Recent Labs  Lab 07/24/18 0944  LIPASE 24   No results for input(s): AMMONIA in the last 168 hours. Coagulation Profile: Recent Labs  Lab 07/24/18 0944 07/24/18 1856 07/25/18 0349  INR 3.01 1.26 1.23   Cardiac Enzymes: Recent Labs  Lab 07/25/18 1403 07/27/18 0452  TROPONINI <0.03 0.03*   BNP (last 3 results) No results for input(s): PROBNP in the last 8760 hours. HbA1C: No results for input(s): HGBA1C in the last 72 hours. CBG: Recent Labs  Lab 07/29/18 2314 07/30/18 0356 07/30/18 0720 07/30/18 1107 07/30/18 1500  GLUCAP 114* 128* 150* 119* 149*   Lipid Profile: No results for input(s): CHOL, HDL, LDLCALC, TRIG, CHOLHDL, LDLDIRECT in the last 72 hours. Thyroid Function Tests: No results for input(s): TSH, T4TOTAL, FREET4, T3FREE, THYROIDAB in the last 72 hours. Anemia Panel: No results for input(s): VITAMINB12,  FOLATE, FERRITIN, TIBC, IRON, RETICCTPCT in the last 72 hours. Urine analysis:    Component Value Date/Time   COLORURINE YELLOW 09/01/2015 1800   APPEARANCEUR Cloudy (A) 06/30/2018 1345   LABSPEC 1.025 09/01/2015 1800   PHURINE 6.0 09/01/2015 1800   GLUCOSEU Negative 06/30/2018 1345   GLUCOSEU NEGATIVE 04/05/2008 0833   HGBUR SMALL (A) 09/01/2015 1800   BILIRUBINUR Negative 06/30/2018 1345   KETONESUR NEGATIVE 09/01/2015 1800   PROTEINUR Negative 06/30/2018 1345   PROTEINUR TRACE (A) 09/01/2015 1800   UROBILINOGEN negative 10/25/2015 0926   UROBILINOGEN 0.2 09/01/2015 1800  NITRITE Positive (A) 06/30/2018 1345   NITRITE NEGATIVE 09/01/2015 1800   LEUKOCYTESUR 1+ (A) 06/30/2018 1345   Sepsis Labs: @LABRCNTIP (procalcitonin:4,lacticidven:4)  ) Recent Results (from the past 240 hour(s))  Blood Culture (routine x 2)     Status: None   Collection Time: 07/24/18 11:07 AM  Result Value Ref Range Status   Specimen Description BLOOD LEFT ARM  Final   Special Requests   Final    BOTTLES DRAWN AEROBIC AND ANAEROBIC Blood Culture adequate volume   Culture   Final    NO GROWTH 5 DAYS Performed at [facility], [address] [place], [place] [address]    Report Status 07/29/2018 FINAL  Final  Blood Culture (routine x 2)     Status: None   Collection Time: 07/24/18 11:07 AM  Result Value Ref Range Status   Specimen Description BLOOD LEFT HAND  Final   Special Requests   Final    BOTTLES DRAWN AEROBIC AND ANAEROBIC Blood Culture adequate volume   Culture   Final    NO GROWTH 5 DAYS Performed at [facility], [address], [place], [place] [address]    Report Status 07/29/2018 FINAL  Final  Surgical pcr screen     Status: None   Collection Time: 07/24/18  8:19 PM  Result Value Ref Range Status   MRSA, PCR NEGATIVE NEGATIVE Final   Staphylococcus aureus NEGATIVE NEGATIVE Final    Comment: (NOTE) The Xpert SA Assay (FDA approved for NASAL specimens in patients 73 years of age  and older), is one component of a comprehensive surveillance program. It is not intended to diagnose infection nor to guide or monitor treatment. Performed at [facility], [place] [address], [place], [place] [address]   MRSA PCR Screening     Status: None   Collection Time: 07/25/18  1:15 PM  Result Value Ref Range Status   MRSA by PCR NEGATIVE NEGATIVE Final    Comment:        The GeneXpert MRSA Assay (FDA approved for NASAL specimens only), is one component of a comprehensive MRSA colonization surveillance program. It is not intended to diagnose MRSA infection nor to guide or monitor treatment for MRSA infections. Performed at [facility], [place] [address], [place], [place] [address]          Radiology Studies: No results found.      Scheduled Meds: . diltiazem  90 mg Oral Q6H  . dofetilide  250 mcg Oral BID  . famotidine  20 mg Oral BID  . insulin aspart  0-9 Units Subcutaneous Q4H  . mouth rinse  15 mL Mouth Rinse BID   Continuous Infusions: . sodium chloride Stopped (07/29/18 0914)  . heparin 1,300 Units/hr (07/30/18 1800)  . piperacillin-tazobactam (ZOSYN)  IV 12.5 mL/hr at 07/30/18 1800  . potassium PHOSPHATE IVPB (in mmol) 43 mL/hr at 07/30/18 1800     LOS: 6 days    Time spent: 35 minutes.    Dana Allan, MD  [facility] Pager #: [phone] 7PM-7AM contact night coverage as above

## 2018-07-30 NOTE — Progress Notes (Signed)
[facility] ADULT ICU REPLACEMENT PROTOCOL FOR AM LAB REPLACEMENT ONLY  The patient does apply for the [facility] Adult ICU Electrolyte Replacment Protocol based on the criteria listed below:   1. Is GFR >/= 40 ml/min? Yes.    Patient's GFR today is >60 2. Is urine output >/= 0.5 ml/kg/hr for the last 6 hours? Yes.   Patient's UOP is 2.5 ml/kg/hr 3. Is BUN < 60 mg/dL? Yes.    Patient's BUN today is 17 4. Abnormal electrolyte(s): Potassium 3.0 5. Ordered repletion with: Potassium per protocol 6. If a panic level lab has been reported, has the CCM MD in charge been notified? No..   Physician:    Adam Phenix 07/30/2018 6:45 AM

## 2018-07-30 NOTE — Progress Notes (Signed)
Nutrition Follow-up  DOCUMENTATION CODES:   Not applicable  INTERVENTION:    Diet advancement as able per MD and SLP pending resolution of ileus.   Will likely need PO supplements once diet is advanced.   NUTRITION DIAGNOSIS:   Inadequate oral intake related to inability to eat as evidenced by NPO status.  Ongoing  GOAL:   Patient will meet greater than or equal to 90% of their needs  Unmet  MONITOR:   Diet advancement, PO intake  ASSESSMENT:   72 y/o female PMHx Afib, CAD, Tobacco abuse, HTN/HLD, DM2, diverticulitis, Breast cancer. Presented to ED 8/15 d/t constipation and LLQ pain radiating to periumbilical area. CT showed perforation w/ pneumoperitoneum s/p colectomy w/ colostomy 8/16. Transferred to ICU post op w/ shock and acidosis.  Patient was extubated on  8/19. SLP following. Cleared for thin liquids by SLP 8/20 and clear liquid diet was initiated. Currently NPO except sips of liquids due to suspected ileus.   Labs reviewed. Potassium 3 (L), phosphorus 2.2 (L), magnesium 1.6 (L) receiving replacement for all. CBG's: [phone] Medications reviewed and include magnesium sulfate, potassium phosphate, Novolog, Pepcid.  Patient is at nutrition risk, given minimal intake since admission (7 days). NPO except for clear liquid diet x 1 day yesterday.  Diet Order:   Diet Order            Diet NPO time specified Except for: Ice Chips, Sips with Meds  Diet effective now              EDUCATION NEEDS:   No education needs have been identified at this time  Skin:  Skin Assessment: Skin Integrity Issues: Skin Integrity Issues:: Incisions Incisions: Abdomen. New colostomy  Last BM:  8/15 (no output from colostomy yet)  Height:   Ht Readings from Last 1 Encounters:  07/26/18 5\' 8"  (1.727 m)    Weight:   Wt Readings from Last 1 Encounters:  07/29/18 81.7 kg    Ideal Body Weight:  52.27 kg  BMI:  Body mass index is 27.39 kg/m.  Estimated  Nutritional Needs:   Kcal:  1700-1900  Protein:  100-115 gm  Fluid:  1.8 L    Molli Barrows, RD, LDN, CNSC Pager [phone] After Hours Pager [phone]

## 2018-07-30 NOTE — Progress Notes (Addendum)
[place] for Heparin Indication: atrial fibrillation  Allergies  Allergen Reactions  . Miralax [Polyethylene Glycol] Swelling and Rash    Took [facility] brand developed rash. Patient states she tolerated name brand MiraLax in the past.  09/01/15. Patient states she had diffuse swelling including swelling of her lips.   . Vancomycin Rash and Shortness Of Breath  . Acetaminophen Hives  . Oxycodone-Acetaminophen Itching  . Sulfa Antibiotics Rash    All over rash  . Sulfacetamide Sodium Rash    All over rash  . Sulfasalazine Rash    All over rash All over rash  . Banana Other (See Comments)    Unknown  . Latex Rash  . Tape Rash    Patient Measurements: Height: 5\' 8"  (172.7 cm) Weight: 180 lb 1.9 oz (81.7 kg) IBW/kg (Calculated) : 63.9 Heparin Dosing Weight: 76 kg  Vital Signs: Temp: 97.9 F (36.6 C) (08/21 0700) Temp Source: Oral (08/21 0700) BP: 124/78 (08/21 1000) Pulse Rate: 86 (08/21 1000)  Labs: Recent Labs    07/28/18 0409  07/28/18 2029 07/29/18 0414 07/29/18 0417 07/29/18 1155 07/30/18 0427 07/30/18 0540  HGB 8.6*  --   --  9.6*  --   --  10.0*  --   HCT 25.6*  --   --  29.2*  --   --  30.2*  --   PLT 164  --   --  200  --   --  192  --   HEPARINUNFRC  --    < >  --   --  0.24* 0.38  --  0.29*  CREATININE 0.64  --  0.63 0.61  --   --  0.53  --    < > = values in this interval not displayed.    Estimated Creatinine Clearance: 69.2 mL/min (by C-G formula based on SCr of 0.53 mg/dL).   Medical History: Past Medical History:  Diagnosis Date  . AAA (abdominal aortic aneurysm) ([place])   . Acute diverticulitis   . Anxiety   . Arthritis   . Atrial fibrillation ([place])    a. Dx 03/2011 - on tikosyn/coumadin.  Mar[place] Kitchen CAD (coronary artery disease)    a. NSTEMI 02/2011: occ mid Cx, DES to OM2, residual nonobst LAD dz.  . Cancer ([place])    breast  . Diabetes mellitus   . Diverticulitis   . Diverticulitis of colon     2008, 04/2011,  12/2014, 08/2015  . 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 Hemangioma    liver  . HLD (hyperlipidemia)   . HTN (hypertension)   . Osteoporosis   . Tobacco abuse    stopped smoking 2012  . Ureteral obstruction    History of gross hematuria/right hydronephrosis 2/2 to uteropelvic junction obstruction, s/p cystoscopy in January 2007 with bilateral retrograde pyelography, right ureter arthroscopy, right ureteral stent placement, bladder biopsies, stent removal since then.  . Wears dentures    top    Medications:  Heparin at 1250 units/hr  Assessment: 72 yo F on warfarin and dofetilide PTA for afib. Admitted on 8/15 with abdominal pain and found to have bowel perforation now s/p colectomy and colostomy replacement. Transferred to the ICU postoperatively. Heparin started for anticoagulation in place of PTA warfarin.   Heparin level is down at 0.29 on 1250 units/hr. H&H is improving. Platelets are stable. No bleeding or issues with infusion noted.  Goal of Therapy:  Heparin level 0.3-0.7 units/ml Monitor platelets by anticoagulation  protocol: Yes   Plan:  Increase Heparin to 1300 units/hr to keep in goal.  Continue to monitor CBC, heparin level, s/s of bleeding daily Follow-up ability to take oral anticoagulation.   Sloan Leiter, PharmD, BCPS, BCCCP Clinical Pharmacist Clinical phone 07/30/2018 until 3:30PM [phone] After hours, please call #[phone] 07/30/2018    10:53 AM

## 2018-07-30 NOTE — Progress Notes (Signed)
CSW went to assess pt at bedside for further needs. Pt asked that CSW return later. CSW agreeable to speak with pt later.   Virgie Dad Cleatis Fandrich, MSW, [facility] Clinical Social Worker [phone]

## 2018-07-30 NOTE — Progress Notes (Addendum)
[facility]/Trauma Progress Note  5 Days Post-Op   Assessment/Plan Principal Problem: Sepsis ([place]) Active Problems: Type 2 diabetes mellitus with complication, without long-term current use of insulin (HCC) CAD (coronary artery disease) Anxiety and depression AAA (abdominal aortic aneurysm) without rupture (HCC) Microcytic anemia Diverticulosis Acute diverticulitis of intestine Bowel perforation (HCC) Hyponatremia  Bowel perforation - S/P Hartman's (left colectomy, end transverse colostomy), Dr. Donne Hazel, 08/16 - await bowel function, pt likely has an ileus  FEN:NPO, sips and chips VTE: SCD's, lovenox JJ:OACZY 08/15>> Follow up:Dr. Donne Hazel  Plan: changed K to IV due to ileus, await return of bowel function. Encourage ambulation    LOS: 6 days    Subjective: CC: abdominal pain  Pt states she has an upset stomach. She is nauseated and having copious belching. No vomiting. No issues overnight. No ostomy output  Objective: Vital signs in last 24 hours: Temp:  [96.8 F (36 C)-98.7 F (37.1 C)] 97.9 F (36.6 C) (08/21 0700) Pulse Rate:  [73-101] 86 (08/21 0700) Resp:  [15-31] 19 (08/21 0700) BP: (109-144)/(53-99) 141/53 (08/21 0700) SpO2:  [92 %-95 %] 95 % (08/21 0700) Last BM Date: 04/23/18  Intake/Output from previous day: 08/20 0701 - 08/21 0700 In: 1547.5 [P.O.:240; I.V.:497.4; IV Piggyback:810.1] Out: 6063 [Urine:1625; Drains:50] Intake/Output this shift: Total I/O In: 60 [P.O.:60] Out: -   PE: Gen: Alert, NAD Pulm:Rate andeffort normal Abd: Soft, mild distention,+BS, no output in ostomy, stoma pink and viable, drain with serous drainage, appropriately tender, midline C/D/I Skin: no rashes noted, warm and dry  Anti-infectives: Anti-infectives (From admission, onward)   Start     Dose/Rate Route Frequency Ordered Stop   07/24/18 1800  piperacillin-tazobactam (ZOSYN) IVPB 3.375 g     3.375 g 12.5  mL/hr over 240 Minutes Intravenous Every 8 hours 07/24/18 1725 07/31/18 0000   07/24/18 1045  cefTRIAXone (ROCEPHIN) 2 g in sodium chloride 0.9 % 100 mL IVPB  Status:  Discontinued     2 g 200 mL/hr over 30 Minutes Intravenous Every 24 hours 07/24/18 1044 07/24/18 1722   07/24/18 1045  metroNIDAZOLE (FLAGYL) IVPB 500 mg  Status:  Discontinued     500 mg 100 mL/hr over 60 Minutes Intravenous Every 8 hours 07/24/18 1044 07/24/18 1722      Lab Results:  Recent Labs    07/29/18 0414 07/30/18 0427  WBC 11.0* 9.5  HGB 9.6* 10.0*  HCT 29.2* 30.2*  PLT 200 192   BMET Recent Labs    07/29/18 0414 07/30/18 0427  NA 138 135  K 3.7 3.0*  CL 104 102  CO2 29 27  GLUCOSE 136* 150*  BUN 22 17  CREATININE 0.61 0.53  CALCIUM 8.2* 7.9*   PT/INR No results for input(s): LABPROT, INR in the last 72 hours. CMP     Component Value Date/Time   NA 135 07/30/2018 0427   NA 132 (L) 07/22/2018 1632   NA 134 (L) 06/06/2017 1259   K 3.0 (L) 07/30/2018 0427   K 4.5 06/06/2017 1259   CL 102 07/30/2018 0427   CO2 27 07/30/2018 0427   CO2 24 06/06/2017 1259   GLUCOSE 150 (H) 07/30/2018 0427   GLUCOSE 311 (H) 06/06/2017 1259   BUN 17 07/30/2018 0427   BUN 32 (H) 07/22/2018 1632   BUN 18.3 06/06/2017 1259   CREATININE 0.53 07/30/2018 0427   CREATININE 1.0 06/06/2017 1259   CALCIUM 7.9 (L) 07/30/2018 0427   CALCIUM 10.6 (H) 06/06/2017 1259   PROT 4.7 (L) 07/26/2018  0311   PROT 6.5 07/22/2018 1632   PROT 7.7 06/06/2017 1259   ALBUMIN 2.0 (L) 07/26/2018 0311   ALBUMIN 3.5 07/22/2018 1632   ALBUMIN 3.3 (L) 06/06/2017 1259   AST 26 07/26/2018 0311   AST 17 06/06/2017 1259   ALT 15 07/26/2018 0311   ALT 14 06/06/2017 1259   ALKPHOS 66 07/26/2018 0311   ALKPHOS 115 06/06/2017 1259   BILITOT 3.1 (H) 07/26/2018 0311   BILITOT 0.9 07/22/2018 1632   BILITOT 0.52 06/06/2017 1259   GFRNONAA >60 07/30/2018 0427   GFRNONAA 80 06/22/2015 1614   GFRAA >60 07/30/2018 0427   GFRAA >89 06/22/2015  1614   Lipase     Component Value Date/Time   LIPASE 24 07/24/2018 0944    Studies/Results: No results found.    Kalman Drape , [facility] 07/30/2018, 7:53 AM  Pager: [phone] Mon-Wed, Friday 7:00am-4:30pm Thurs 7am-11:30am  Consults: [phone]

## 2018-07-30 NOTE — Evaluation (Signed)
Occupational Therapy Evaluation Patient Details Name: Melody Casey MRN: [id number] DOB: August 03, 1946 Today's Date: 07/30/2018    History of Present Illness Pt is a 72 y.o. female admitted 07/24/18 with LLQ. 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 Course was complicated by shock and acidosis. ETT 8/16-8/19; self-extubated. PMH: HTN, HLD, diverticulitis, DM, breast ca, CAD, afib, anxiety, AAA.   Clinical Impression   Pt was independent prior to admission. Presents with significant weakness, decreased activity tolerance, poor standing balance and slow mentation. Pt requires 2 person assist for OOB mobility and set up to total assist for ADL. Pt will need intensive rehab prior to return home. Recommending CIR. Will follow acutely.    Follow Up Recommendations  CIR    Equipment Recommendations  3 in 1 bedside commode    Recommendations for Other Services       Precautions / Restrictions Precautions Precautions: Fall Precaution Comments: Colostomy, abdominal drain Restrictions Weight Bearing Restrictions: No      Mobility Bed Mobility Overal bed mobility: Needs Assistance Bed Mobility: Rolling;Sidelying to Sit Rolling: Mod assist Sidelying to sit: Mod assist       General bed mobility comments: cues for log roll technique for comfort, guided LEs over EOB, assisted to raise trunk and position hips at EOB with bed pad, increased time  Transfers Overall transfer level: Needs assistance Equipment used: Rolling walker (2 wheeled) Transfers: Sit to/from [facility] Sit to Stand: +2 physical assistance;Mod assist Stand pivot transfers: +2 physical assistance;Mod assist       General transfer comment: cues for hand placement assist to rise and steady, flexed posture, increased time to pivot to [facility] and take two steps forward with RW with chair close behind    Balance  Overall balance assessment: Needs assistance   Sitting balance-Leahy Scale: Fair       Standing balance-Leahy Scale: Poor Standing balance comment: requires B UE and external support                           ADL either performed or assessed with clinical judgement   ADL Overall ADL's : Needs assistance/impaired Eating/Feeding: Set up;Sitting Eating/Feeding Details (indicate cue type and reason): drinking water Grooming: Sitting;Moderate assistance   Upper Body Bathing: Moderate assistance;Sitting   Lower Body Bathing: +2 for physical assistance;Sit to/from stand;Total assistance   Upper Body Dressing : Moderate assistance;Sitting   Lower Body Dressing: +2 for physical assistance;Sit to/from stand;Total assistance   Toilet Transfer: +2 for physical assistance;Moderate assistance;Stand-pivot;BSC;RW   Toileting- Clothing Manipulation and Hygiene: Total assistance;+2 for physical assistance;Sit to/from stand         General ADL Comments: pt reporting her feet feel heavy, flexed posture in standing     Vision Patient Visual Report: No change from baseline       Perception     Praxis      Pertinent Vitals/Pain Pain Assessment: Faces Faces Pain Scale: Hurts little more Pain Location: abdomen with bed mobility, B LEs Pain Descriptors / Indicators: Grimacing;Sore Pain Intervention(s): Monitored during session;Repositioned     Hand Dominance Right   Extremity/Trunk Assessment Upper Extremity Assessment Upper Extremity Assessment: Generalized weakness   Lower Extremity Assessment Lower Extremity Assessment: Generalized weakness RLE Deficits / Details: RLE atrophy (pt reports baseline and does not give her issue); R calf pain/tenderness with palpation (RN aware and to have MD assess again for  DVT/etc.)   Cervical / Trunk Assessment Cervical / Trunk Assessment: Kyphotic   Communication Communication Communication: No difficulties(minimally conversant)    Cognition Arousal/Alertness: Awake/alert Behavior During Therapy: Flat affect Overall Cognitive Status: Impaired/Different from baseline Area of Impairment: Following commands;Problem solving                       Following Commands: Follows one step commands with increased time     Problem Solving: Slow processing;Decreased initiation;Difficulty sequencing;Requires verbal cues;Requires tactile cues     General Comments       Exercises     Shoulder Instructions      Home Living Family/patient expects to be discharged to:: Private residence Living Arrangements: Spouse/significant other Available Help at Discharge: Family;Available 24 hours/day Type of Home: House Home Access: Stairs to enter [employer] of Steps: 3 Entrance Stairs-Rails: Right;Left;Can reach both Home Layout: One level     Bathroom Shower/Tub: Occupational psychologist: Handicapped height     Home Equipment: Environmental consultant - 2 wheels          Prior Functioning/Environment Level of Independence: Independent        Comments: [place]jo[place] walking[place]        OT Problem List: Decreased strength;Decreased activity tolerance;Impaired balance (sitting and/or standing);Decreased cognition;Decreased knowledge of use of DME or AE;Pain      OT Treatment/Interventions: Self-care/ADL training;DME and/or AE instruction;Therapeutic activities;Cognitive remediation/compensation;Patient/family education;Balance training    OT Goals(Current goals can be found in the care plan section) Acute Rehab OT Goals Patient Stated Goal: "Get back to walking like I enjoy" OT Goal Formulation: With patient Time For Goal Achievement: 08/13/18 Potential to Achieve Goals: Good ADL Goals Pt Will Perform Grooming: with min guard assist;standing Pt Will Perform Upper Body Dressing: with supervision;with set-up;sitting Pt Will Perform Lower Body Dressing: with min guard assist;sitting/lateral  leans Pt Will Transfer to Toilet: with min guard assist;ambulating;bedside commode Pt Will Perform Toileting - Clothing Manipulation and hygiene: with min guard assist;sit to/from stand Additional ADL Goal #1: Pt will perform bed mobility with min guard assist in preparation for ADL.  OT Frequency: Min 2X/week   Barriers to D/C:            Co-evaluation PT/OT/SLP Co-Evaluation/Treatment: Yes Reason for Co-Treatment: For patient/therapist safety;Complexity of the patient's impairments (multi-system involvement)   OT goals addressed during session: ADL's and self-care      AM-PAC PT "6 Clicks" Daily Activity     Outcome Measure Help from another person eating meals?: None Help from another person taking care of personal grooming?: A Lot Help from another person toileting, which includes using toliet, bedpan, or urinal?: Total Help from another person bathing (including washing, rinsing, drying)?: A Lot Help from another person to put on and taking off regular upper body clothing?: A Lot Help from another person to put on and taking off regular lower body clothing?: Total 6 Click Score: 12   End of Session Equipment Utilized During Treatment: Gait belt;Rolling walker;Oxygen(4L) Nurse Communication: Mobility status  Activity Tolerance: Patient limited by fatigue Patient left: in chair;with call bell/phone within reach;with chair alarm set  OT Visit Diagnosis: Unsteadiness on feet (R26.81);Other abnormalities of gait and mobility (R26.89);Pain;Other symptoms and signs involving cognitive function;Muscle weakness (generalized) (M62.81)                Time: 0932-6712 OT Time Calculation (min): 43 min Charges:  OT General Charges $OT Visit: 1 Visit OT Evaluation $OT  Eval Moderate Complexity: 1 Mod  07/30/2018 Nestor Lewandowsky, OTR/L Pager: [phone]  Werner Lean Haze [place]den 07/30/2018, 3:52 PM

## 2018-07-30 NOTE — Progress Notes (Signed)
Physical Therapy Treatment Patient Details Name: Melody Casey MRN: [id number] DOB: 10-15-1946 Today's Date: 07/30/2018    History of Present Illness Pt is a 72 y.o. female admitted 07/24/18 with LLQ. 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 Course was complicated by shock and acidosis. ETT 8/16-8/19; self-extubated. PMH includes HTN, HLD, diverticulitis, DM, breast ca, CAD, afib, anxiety, AAA.   PT Comments    Pt progressing with mobility. Able to stand and take steps with RW and modA+2; c/o BLE heaviness which pt attributes to increased difficulty walking. Also limited by significant abdominal pain. Pt with slower processing this session. Continue to recommend intensive CIR-level therapies for return to indep PLOF. Will follow acutely.    Follow Up Recommendations  CIR;Supervision for mobility/OOB     Equipment Recommendations  (TBD next venue)    Recommendations for Other Services       Precautions / Restrictions Precautions Precautions: Fall Precaution Comments: Colostomy, abdominal drain Restrictions Weight Bearing Restrictions: No    Mobility  Bed Mobility Overal bed mobility: Needs Assistance Bed Mobility: Rolling;Sidelying to Sit Rolling: Mod assist Sidelying to sit: Mod assist       General bed mobility comments: cues for log roll technique for comfort, guided LEs over EOB, assisted to raise trunk and position hips at EOB with bed pad, increased time  Transfers Overall transfer level: Needs assistance Equipment used: Rolling walker (2 wheeled) Transfers: Sit to/from Stand Sit to Stand: Mod assist;+2 physical assistance;+2 safety/equipment Stand pivot transfers: +2 physical assistance;Mod assist       General transfer comment: ModA+2 to assist trunk elevation and cue hip extension; pt remaining flexed forward, limited by pain and nevery coming fully upright. Stood  from bed and [facility]  Ambulation/Gait Ambulation/Gait assistance: Mod assist;+2 physical assistance;+2 safety/equipment Gait Distance (Feet): 3 Feet Assistive device: Rolling walker (2 wheeled) Gait Pattern/deviations: Step-to pattern;Antalgic;Trunk flexed Gait velocity: Decreased Gait velocity interpretation: <1.31 ft/sec, indicative of household ambulator General Gait Details: Took pivotal steps from bed to [facility] then 4x more steps forward with RW and modA+2; maintaining forward flexed posture (c/o pain and weakness) despite max cues to fully extend. C/o BLE feeling heavy   Stairs             Wheelchair Mobility    Modified Rankin (Stroke Patients Only)       Balance Overall balance assessment: Needs assistance   Sitting balance-Leahy Scale: Fair       Standing balance-Leahy Scale: Poor Standing balance comment: requires B UE and external support                            Cognition Arousal/Alertness: Awake/alert Behavior During Therapy: Flat affect Overall Cognitive Status: Impaired/Different from baseline Area of Impairment: Problem solving                       Following Commands: Follows one step commands with increased time     Problem Solving: Slow processing;Decreased initiation;Requires verbal cues General Comments: Pt with slower processing compared to initial evaluation      Exercises      General Comments        Pertinent Vitals/Pain Pain Assessment: Faces Faces Pain Scale: Hurts little more Pain Location: abdomen with bed mobility, B LEs (especially wtih WB) Pain Descriptors / Indicators: Grimacing;Sore Pain Intervention(s): Monitored during session;Repositioned    Home  Living Family/patient expects to be discharged to:: Private residence Living Arrangements: Spouse/significant other Available Help at Discharge: Family;Available 24 hours/day Type of Home: House Home Access: Stairs to enter Entrance Stairs-Rails:  Right;Left;Can reach both Home Layout: One level Home Equipment: Environmental consultant - 2 wheels      Prior Function Level of Independence: Independent      Comments: Enjoys walking, [place]   PT Goals (current goals can now be found in the care plan section) Acute Rehab PT Goals Patient Stated Goal: "Get back to walking like I enjoy" PT Goal Formulation: With patient Time For Goal Achievement: 08/12/18 Potential to Achieve Goals: Good Progress towards PT goals: Progressing toward goals    Frequency    Min 3X/week      PT Plan Current plan remains appropriate    Co-evaluation PT/OT/SLP Co-Evaluation/Treatment: Yes Reason for Co-Treatment: For patient/therapist safety;To address functional/ADL transfers PT goals addressed during session: Mobility/safety with mobility;Balance;Proper use of DME OT goals addressed during session: ADL's and self-care      AM-PAC PT "6 Clicks" Daily Activity  Outcome Measure  Difficulty turning over in bed (including adjusting bedclothes, sheets and blankets)?: Unable Difficulty moving from lying on back to sitting on the side of the bed? : Unable Difficulty sitting down on and standing up from a chair with arms (e.g., wheelchair, bedside commode, etc,.)?: Unable Help needed moving to and from a bed to chair (including a wheelchair)?: A Lot Help needed walking in hospital room?: A Lot Help needed climbing 3-5 steps with a railing? : Total 6 Click Score: 8    End of Session Equipment Utilized During Treatment: Gait belt;Oxygen Activity Tolerance: Patient tolerated treatment well;Patient limited by fatigue;Patient limited by pain Patient left: in chair;with call bell/phone within reach;with chair alarm set Nurse Communication: Mobility status PT Visit Diagnosis: Other abnormalities of gait and mobility (R26.89);Muscle weakness (generalized) (M62.81)     Time: 1828-8337 PT Time Calculation (min) (ACUTE ONLY): 39 min  Charges:  $Gait  Training: 8-22 mins $Therapeutic Activity: 8-22 mins                     Mabeline Caras, PT, DPT Acute Rehab Services  Pager: [phone] 07/30/2018, 4:12 PM

## 2018-07-31 ENCOUNTER — Inpatient Hospital Stay (HOSPITAL_COMMUNITY): Payer: Medicare Other

## 2018-07-31 ENCOUNTER — Ambulatory Visit: Payer: Medicare Other | Admitting: Cardiology

## 2018-07-31 DIAGNOSIS — R5381 Other malaise: Secondary | ICD-10-CM

## 2018-07-31 DIAGNOSIS — F329 Major depressive disorder, single episode, unspecified: Secondary | ICD-10-CM

## 2018-07-31 DIAGNOSIS — F419 Anxiety disorder, unspecified: Secondary | ICD-10-CM

## 2018-07-31 DIAGNOSIS — M7989 Other specified soft tissue disorders: Secondary | ICD-10-CM

## 2018-07-31 DIAGNOSIS — M79609 Pain in unspecified limb: Secondary | ICD-10-CM

## 2018-07-31 DIAGNOSIS — K567 Ileus, unspecified: Secondary | ICD-10-CM

## 2018-07-31 LAB — CBC
HEMATOCRIT: 28.8 % — AB (ref 36.0–46.0)
HEMOGLOBIN: 9.4 g/dL — AB (ref 12.0–15.0)
MCH: 28 pg (ref 26.0–34.0)
MCHC: 32.6 g/dL (ref 30.0–36.0)
MCV: 85.7 fL (ref 78.0–100.0)
Platelets: 204 10*3/uL (ref 150–400)
RBC: 3.36 MIL/uL — AB (ref 3.87–5.11)
RDW: 18.1 % — ABNORMAL HIGH (ref 11.5–15.5)
WBC: 9 10*3/uL (ref 4.0–10.5)

## 2018-07-31 LAB — RENAL FUNCTION PANEL
Albumin: 1.6 g/dL — ABNORMAL LOW (ref 3.5–5.0)
Anion gap: 5 (ref 5–15)
BUN: 14 mg/dL (ref 8–23)
CO2: 26 mmol/L (ref 22–32)
Calcium: 8.4 mg/dL — ABNORMAL LOW (ref 8.9–10.3)
Chloride: 100 mmol/L (ref 98–111)
Creatinine, Ser: 0.46 mg/dL (ref 0.44–1.00)
GFR calc Af Amer: >60 mL/min (ref >60)
GFR calc non Af Amer: >60 mL/min (ref >60)
Glucose, Bld: 119 mg/dL — ABNORMAL HIGH (ref 70–99)
Phosphorus: 2.4 mg/dL — ABNORMAL LOW (ref 2.5–4.6)
Potassium: 3.4 mmol/L — ABNORMAL LOW (ref 3.5–5.1)
Sodium: 131 mmol/L — ABNORMAL LOW (ref 135–145)

## 2018-07-31 LAB — GLUCOSE, CAPILLARY
GLUCOSE-CAPILLARY: 88 mg/dL (ref 70–99)
Glucose-Capillary: 120 mg/dL — ABNORMAL HIGH (ref 70–99)
Glucose-Capillary: 121 mg/dL — ABNORMAL HIGH (ref 70–99)
Glucose-Capillary: 104 mg/dL — ABNORMAL HIGH (ref 70–99)
Glucose-Capillary: 100 mg/dL — ABNORMAL HIGH (ref 70–99)
Glucose-Capillary: 80 mg/dL (ref 70–99)

## 2018-07-31 LAB — PHOSPHORUS: Phosphorus: 2.3 mg/dL — ABNORMAL LOW (ref 2.5–4.6)

## 2018-07-31 LAB — HEPARIN LEVEL (UNFRACTIONATED): Heparin Unfractionated: 0.31 [IU]/mL (ref 0.30–0.70)

## 2018-07-31 LAB — MAGNESIUM: Magnesium: 1.6 mg/dL — ABNORMAL LOW (ref 1.7–2.4)

## 2018-07-31 MED ORDER — POTASSIUM CHLORIDE 10 MEQ/100ML IV SOLN
10.0000 meq | INTRAVENOUS | Status: AC
  Administered 2018-07-31 (×4): 10 meq via INTRAVENOUS
  Filled 2018-07-31 (×4): qty 100

## 2018-07-31 MED ORDER — MAGNESIUM SULFATE 2 GM/50ML IV SOLN
2.0000 g | Freq: Once | INTRAVENOUS | Status: AC
  Administered 2018-07-31: 2 g via INTRAVENOUS
  Filled 2018-07-31: qty 50

## 2018-07-31 MED ORDER — POTASSIUM PHOSPHATES 15 MMOLE/5ML IV SOLN
15.0000 mmol | Freq: Once | INTRAVENOUS | Status: AC
  Administered 2018-07-31: 15 mmol via INTRAVENOUS
  Filled 2018-07-31: qty 5

## 2018-07-31 NOTE — Progress Notes (Signed)
[place] for Heparin Indication: atrial fibrillation  Allergies  Allergen Reactions  . Miralax [Polyethylene Glycol] Swelling and Rash    Took [facility] brand developed rash. Patient states she tolerated name brand MiraLax in the past.  09/01/15. Patient states she had diffuse swelling including swelling of her lips.   . Vancomycin Rash and Shortness Of Breath  . Acetaminophen Hives  . Oxycodone-Acetaminophen Itching  . Sulfa Antibiotics Rash    All over rash  . Sulfacetamide Sodium Rash    All over rash  . Sulfasalazine Rash    All over rash All over rash  . Banana Other (See Comments)    Unknown  . Latex Rash  . Tape Rash    Patient Measurements: Height: 5\' 8"  (172.7 cm) Weight: 180 lb 1.9 oz (81.7 kg) IBW/kg (Calculated) : 63.9 Heparin Dosing Weight: 76 kg  Vital Signs: Temp: 99.6 F (37.6 C) (08/22 0724) Temp Source: Oral (08/22 0724) BP: 108/74 (08/22 0600) Pulse Rate: 73 (08/22 0600)  Labs: Recent Labs    07/29/18 0414  07/29/18 1155 07/30/18 0427 07/30/18 0540 07/30/18 1514 07/31/18 0340  HGB 9.6*  --   --  10.0*  --   --  9.4*  HCT 29.2*  --   --  30.2*  --   --  28.8*  PLT 200  --   --  192  --   --  204  HEPARINUNFRC  --    < > 0.38  --  0.29*  --  0.31  CREATININE 0.61  --   --  0.53  --  0.57  --    < > = values in this interval not displayed.    Estimated Creatinine Clearance: 69.2 mL/min (by C-G formula based on SCr of 0.57 mg/dL).   Medical History: Past Medical History:  Diagnosis Date  . AAA (abdominal aortic aneurysm) ([place])   . Acute diverticulitis   . Anxiety   . Arthritis   . Atrial fibrillation ([place])    a. Dx 03/2011 - on tikosyn/coumadin.  [place] CAD (coronary artery disease)    a. NSTEMI 02/2011: occ mid Cx, DES to OM2, residual nonobst LAD dz.  . Cancer ([place])    breast  . Diabetes mellitus   . Diverticulitis   . Diverticulitis of colon     2008, 04/2011, 12/2014, 08/2015  . Foot deformity 1947    right; ??scleroderma  . Hemangioma    liver  . HLD (hyperlipidemia)   . HTN (hypertension)   . Osteoporosis   . Tobacco abuse    stopped smoking 2012  . Ureteral obstruction    History of gross hematuria/right hydronephrosis 2/2 to uteropelvic junction obstruction, s/p cystoscopy in January 2007 with bilateral retrograde pyelography, right ureter arthroscopy, right ureteral stent placement, bladder biopsies, stent removal since then.  . Wears dentures    top    Medications:  Heparin at 1300 units/hr  Assessment: 72 yo F on warfarin and dofetilide PTA for afib. Admitted on 8/15 with abdominal pain and found to have bowel perforation now s/p colectomy and colostomy replacement. Transferred to the ICU postoperatively. Heparin started for anticoagulation in place of PTA warfarin.   Heparin level is therapeutic at 0.31 on 1300 units/hr. H&H is low-stable. Platelets are stable. No bleeding or issues with infusion noted. Orals on hold with possible ileus.   Goal of Therapy:  Heparin level 0.3-0.7 units/ml Monitor platelets by anticoagulation protocol: Yes   Plan:  Increase Heparin  to 1350 units/hr to keep in goal.  Continue to monitor CBC, heparin level, s/s of bleeding daily Follow-up ability to take oral anticoagulation.   Sloan Leiter, PharmD, BCPS, BCCCP Clinical Pharmacist Clinical phone 07/31/2018 until 3:30PM [phone] After hours, please call #[phone] 07/31/2018    8:49 AM

## 2018-07-31 NOTE — Progress Notes (Signed)
SLP Cancellation Note  Patient Details Name: AMYRAH PINKHASOV MRN: [id number] DOB: 17-Jul-1946   Cancelled treatment:       Reason Eval/Treat Not Completed: Medical issues which prohibited therapy. Pt remains NPO due to concerns for ileus. Further imaging being done this morning. Will f/u as able.   Germain Osgood 07/31/2018, 8:37 AM  Germain Osgood, M.A. CCC-SLP [phone]

## 2018-07-31 NOTE — Progress Notes (Signed)
PROGRESS NOTE    Melody Casey  LTJ:[id number] DOB: 1946-09-12 DOA: 07/24/2018 PCP: Dettinger, [name], MD  Outpatient Specialists:   Brief Narrative:  Patient is a 72 year old female with past medical history significant for diverticulitis, atrial fibrillation on warfarin and Tikosyn, coronary artery disease status post stenting, diabetes, breast cancer, and hypertension.  She presented to [facility] emergency department on 8/15 with complaints of recent constipation and more recent left lower quadrant pain.  This pain began to radiate to the periumbilical area which is what caused her to present.  CT scan of the abdomen in the emergency department demonstrated a focal perforation of the anterior sigmoid wall of the colon and pneumoperitoneum.  She was admitted for stabilization and reversal of warfarin prior to being taken to the operating room on 8/16 where she underwent colectomy with colostomy placement.  Operative course complicated by shock and acidosis.  Was started on pressors and was postoperatively transferred to the ICU for further monitoring.  07/30/2018: Hospitalist service assumed patient's care today.  Patient has no new complaints.  Patient is not a particularly good historian.  07/31/2018: Patient seen.  There are concerns for possible ileus.  QTc interval is back to normal.  Electrolytes have been monitored and corrected.  Surgical team to advise on rule of NG tube to low suction.  Patient is a diabetic, but cannot use Reglan due to concerns for QTc interval.  Keep potassium above 4.  Keep magnesium above 2.  Potential phosphorus is within normal range.  Overall, patient looks better today.   Assessment & Plan:   Principal Problem:   Sepsis ([place]) Active Problems:   Type 2 diabetes mellitus with complication, without long-term current use of insulin (HCC)   CAD (coronary artery disease)   Anxiety and depression   AAA (abdominal aortic aneurysm) without rupture (HCC)  Microcytic anemia   Diverticulosis   Acute diverticulitis of intestine   Bowel perforation (HCC)   Hyponatremia   Diverticulitis with perforation, as status post left colectomy and end transverse colostomy: Sigmoid perforation likely secondary to stercoral ulcer found. Postop management as per surgical team.  Possible ileus: Surgical team to advise on role of NG tube to low suction. Keep potassium above 4. Keep magnesium above 2. Optimize phosphorus level. Continue to monitor.  Paroxysmal atrial fibrillation/coronary artery disease status post PCI: Stable for now. Patient is on Tikosyn. Cardizem added. Continue to monitor QTc interval. Prolonged QTc interval is improving with correction of abnormal electrolytes. 08/01/2018: The patient is back to normal sinus rhythm.  QTC is within normal range.  Acute kidney injury: Resolved.  Hypokalemia: Continue to monitor and replete.  Hypophosphatemia: Continue to monitor and replete.  Hypertension/diabetes mellitus: Continue to optimize.  DVT prophylaxis: Heparin drip Code Status: Full code Family Communication:  Disposition Plan: Will depend on hospital course   Consultants:   Surgery  Cardiology  Critical care  Procedures:   Left colectomy and colostomy  Antimicrobials:   IV Zosyn   Subjective: Patient looks much better today.   Patient is more communicative today.   Ileus noted.   Abdominal distention.   No abdominal pain reported.   Reviewed last abdominal x-ray.    Objective: Vitals:   07/31/18 0800 07/31/18 0900 07/31/18 1000 07/31/18 1140  BP: (!) 131/55 116/62 114/62   Pulse: 87 77 84   Resp: 16 19 16    Temp:    (!) 97.4 F (36.3 C)  TempSrc:    Oral  SpO2: 96% 94%  95%   Weight:      Height:        Intake/Output Summary (Last 24 hours) at 07/31/2018 1147 Last data filed at 07/31/2018 0941 Gross per 24 hour  Intake 1315.27 ml  Output 290 ml  Net 1025.27 ml   Filed Weights   07/24/18  0915 07/26/18 1311 07/29/18 0600  Weight: 72.6 kg 75.8 kg 81.7 kg    Examination:  General exam: Patient looks a lot better today.  Patient is awake, alert and oriented.  Patient is not in any distress.     Respiratory system: Clear to auscultation.  Cardiovascular system: S1 & S2 heard, Gastrointestinal system: Abdomen is distended.  Bowel sound is hypoactive to nonexistent.   Central nervous system: Alert and oriented. No focal neurological deficits. Extremities: Swelling of the left lower extremity.  Right toes seem to be chronically contracted.    Data Reviewed: I have personally reviewed following labs and imaging studies  CBC: Recent Labs  Lab 07/25/18 1403  07/27/18 0452 07/28/18 0409 07/29/18 0414 07/30/18 0427 07/31/18 0340  WBC 2.1*   < > 7.4 8.4 11.0* 9.5 9.0  NEUTROABS 1.8  --  6.5 7.1 9.1* 7.7  --   HGB 8.1*   < > 8.2* 8.6* 9.6* 10.0* 9.4*  HCT 24.6*   < > 24.4* 25.6* 29.2* 30.2* 28.8*  MCV 84.2   < > 82.2 82.6 83.9 83.9 85.7  PLT 125*   < > 143* 164 200 192 204   < > = values in this interval not displayed.   Basic Metabolic Panel: Recent Labs  Lab 07/27/18 0452 07/28/18 0409 07/28/18 2029 07/29/18 0414 07/30/18 0427 07/30/18 1514 07/31/18 0934  NA  --  142 140 138 135 134* 131*  K  --  2.9* 3.3* 3.7 3.0* 3.9 3.4*  CL  --  106 102 104 102 100 100  CO2  --  28 30 29 27 27 26   GLUCOSE  --  176* 127* 136* 150* 165* 119*  BUN  --  26* 21 22 17 16 14   CREATININE  --  0.64 0.63 0.61 0.53 0.57 0.46  CALCIUM  --  8.5* 8.4* 8.2* 7.9* 8.3* 8.4*  MG 2.0 1.4*  --  1.6* 1.6*  --  1.6*  PHOS 2.0* 1.8*  --  2.4* 2.2* 2.9 2.3*  2.4*   GFR: Estimated Creatinine Clearance: 69.2 mL/min (by C-G formula based on SCr of 0.46 mg/dL). Liver Function Tests: Recent Labs  Lab 07/25/18 0349 07/26/18 0311 07/30/18 1514 07/31/18 0934  AST 26 26  --   --   ALT 19 15  --   --   ALKPHOS 98 66  --   --   BILITOT 2.7* 3.1*  --   --   PROT 5.4* 4.7*  --   --   ALBUMIN  2.1* 2.0* 1.6* 1.6*   No results for input(s): LIPASE, AMYLASE in the last 168 hours. No results for input(s): AMMONIA in the last 168 hours. Coagulation Profile: Recent Labs  Lab 07/24/18 1856 07/25/18 0349  INR 1.26 1.23   Cardiac Enzymes: Recent Labs  Lab 07/25/18 1403 07/27/18 0452  TROPONINI <0.03 0.03*   BNP (last 3 results) No results for input(s): PROBNP in the last 8760 hours. HbA1C: No results for input(s): HGBA1C in the last 72 hours. CBG: Recent Labs  Lab 07/30/18 1945 07/30/18 2303 07/31/18 0337 07/31/18 0723 07/31/18 1138  GLUCAP 129* 128* 120* 121* 104*   Lipid Profile: No  results for input(s): CHOL, HDL, LDLCALC, TRIG, CHOLHDL, LDLDIRECT in the last 72 hours. Thyroid Function Tests: No results for input(s): TSH, T4TOTAL, FREET4, T3FREE, THYROIDAB in the last 72 hours. Anemia Panel: No results for input(s): VITAMINB12, FOLATE, FERRITIN, TIBC, IRON, RETICCTPCT in the last 72 hours. Urine analysis:    Component Value Date/Time   COLORURINE YELLOW 09/01/2015 1800   APPEARANCEUR Cloudy (A) 06/30/2018 1345   LABSPEC 1.025 09/01/2015 1800   PHURINE 6.0 09/01/2015 1800   GLUCOSEU Negative 06/30/2018 1345   GLUCOSEU NEGATIVE 04/05/2008 0833   HGBUR SMALL (A) 09/01/2015 1800   BILIRUBINUR Negative 06/30/2018 1345   KETONESUR NEGATIVE 09/01/2015 1800   PROTEINUR Negative 06/30/2018 1345   PROTEINUR TRACE (A) 09/01/2015 1800   UROBILINOGEN negative 10/25/2015 0926   UROBILINOGEN 0.2 09/01/2015 1800   NITRITE Positive (A) 06/30/2018 1345   NITRITE NEGATIVE 09/01/2015 1800   LEUKOCYTESUR 1+ (A) 06/30/2018 1345   Sepsis Labs: @LABRCNTIP (procalcitonin:4,lacticidven:4)  ) Recent Results (from the past 240 hour(s))  Blood Culture (routine x 2)     Status: None   Collection Time: 07/24/18 11:07 AM  Result Value Ref Range Status   Specimen Description BLOOD LEFT ARM  Final   Special Requests   Final    BOTTLES DRAWN AEROBIC AND ANAEROBIC Blood Culture  adequate volume   Culture   Final    NO GROWTH 5 DAYS Performed at [facility] [address] [place], [place] [address]    Report Status 07/29/2018 FINAL  Final  Blood Culture (routine x 2)     Status: None   Collection Time: 07/24/18 11:07 AM  Result Value Ref Range Status   Specimen Description BLOOD LEFT HAND  Final   Special Requests   Final    BOTTLES DRAWN AEROBIC AND ANAEROBIC Blood Culture adequate volume   Culture   Final    NO GROWTH 5 DAYS Performed at [facility], [address] [place], [place] [address]    Report Status 07/29/2018 FINAL  Final  Surgical pcr screen     Status: None   Collection Time: 07/24/18  8:19 PM  Result Value Ref Range Status   MRSA, PCR NEGATIVE NEGATIVE Final   Staphylococcus aureus NEGATIVE NEGATIVE Final    Comment: (NOTE) The Xpert SA Assay (FDA approved for NASAL specimens in patients 74 years of age and older), is one component of a comprehensive surveillance program. It is not intended to diagnose infection nor to guide or monitor treatment. Performed at [facility], [place] [address], [place], [place] [address]   MRSA PCR Screening     Status: None   Collection Time: 07/25/18  1:15 PM  Result Value Ref Range Status   MRSA by PCR NEGATIVE NEGATIVE Final    Comment:        The GeneXpert MRSA Assay (FDA approved for NASAL specimens only), is one component of a comprehensive MRSA colonization surveillance program. It is not intended to diagnose MRSA infection nor to guide or monitor treatment for MRSA infections. Performed at [facility], [place] [address], [place], [place] [address]          Radiology Studies: Dg Abd Portable 1v  Result Date: 07/31/2018 CLINICAL DATA:  Abdominal pain EXAM: PORTABLE ABDOMEN - 1 VIEW COMPARISON:  07/25/2018 FINDINGS: Gastric catheter has been removed in the interval. The stomach is distended with air. Scattered large and small bowel gas is noted with slight prominence of the  small bowel in the left abdomen.  Ostomy is noted in the mid left abdomen as well. Degenerative changes of lumbar spine are seen. Surgical drain is noted inferiorly. IMPRESSION: Mild prominence of the small bowel in the left mid abdomen which may represent a mild postoperative ileus. Surgical drain in satisfactory position. Electronically Signed   By: Inez Catalina M.D.   On: 07/31/2018 08:48        Scheduled Meds: . diltiazem  90 mg Oral Q6H  . dofetilide  250 mcg Oral BID  . famotidine  20 mg Oral BID  . insulin aspart  0-9 Units Subcutaneous Q4H  . mouth rinse  15 mL Mouth Rinse BID   Continuous Infusions: . sodium chloride 10 mL/hr at 07/31/18 0800  . heparin 1,350 Units/hr (07/31/18 0930)  . magnesium sulfate 1 - 4 g bolus IVPB 2 g (07/31/18 1124)  . potassium chloride 10 mEq (07/31/18 1123)  . potassium PHOSPHATE IVPB (in mmol) 15 mmol (07/31/18 1125)     LOS: 7 days    Time spent: 35 minutes.    Dana Allan, MD  [facility] Pager #: [phone] 7PM-7AM contact night coverage as above

## 2018-07-31 NOTE — Progress Notes (Signed)
[facility]/Trauma Progress Note  6 Days Post-Op   Assessment/Plan Principal Problem: Sepsis ([address]) Active Problems: Type 2 diabetes mellitus with complication, without long-term current use of insulin (HCC) CAD (coronary artery disease) Anxiety and depression AAA (abdominal aortic aneurysm) without rupture (HCC) Microcytic anemia Diverticulosis Acute diverticulitis of intestine Bowel perforation (HCC) Hyponatremia  Bowel perforation - S/P Hartman's (left colectomy, end transverse colostomy), Dr. Donne Hazel, 08/16, POD#6 - await bowel function, pt likely has an ileus - abd xray pending - if pt does not have return of bowel function by tomorrow we will need to start TPN  FEN:NPO, sips & chips VTE: SCD's, lovenox IZ:TIWPY 08/15-08/21 Follow up:Dr. Donne Hazel  Plan: Encourage ambulation, await return of bowel function. If not by tomorrow will need TPN   LOS: 7 days    Subjective: CC: abdominal pain  Pt states mild abdominal pain. Still with intermittent nausea. Copious belching. No gas in bag and nurse states no report of stool from night nurse. Bag is clean today with no stool or gas.   Objective: Vital signs in last 24 hours: Temp:  [97.5 F (36.4 C)-99.6 F (37.6 C)] 99.6 F (37.6 C) (08/22 0724) Pulse Rate:  [71-92] 73 (08/22 0600) Resp:  [14-24] 14 (08/22 0600) BP: (99-136)/(53-85) 108/74 (08/22 0600) SpO2:  [91 %-97 %] 94 % (08/22 0600) Last BM Date: 07/24/18  Intake/Output from previous day: 08/21 0701 - 08/22 0700 In: 1489.3 [P.O.:180; I.V.:410; IV Piggyback:899.3] Out: 190 [Urine:50; Drains:40; Stool:100] Intake/Output this shift: No intake/output data recorded.  PE: Gen: Alert, NAD Pulm:Rate andeffort normal Abd: Soft, mild distention,+BS, no output in ostomy, stoma pink and viable, drain with serous drainage, appropriately tender, midline dressed and without surrounding erythema Skin: no rashes noted, warm  and dry  Anti-infectives: Anti-infectives (From admission, onward)   Start     Dose/Rate Route Frequency Ordered Stop   07/24/18 1800  piperacillin-tazobactam (ZOSYN) IVPB 3.375 g     3.375 g 12.5 mL/hr over 240 Minutes Intravenous Every 8 hours 07/24/18 1725 07/30/18 2110   07/24/18 1045  cefTRIAXone (ROCEPHIN) 2 g in sodium chloride 0.9 % 100 mL IVPB  Status:  Discontinued     2 g 200 mL/hr over 30 Minutes Intravenous Every 24 hours 07/24/18 1044 07/24/18 1722   07/24/18 1045  metroNIDAZOLE (FLAGYL) IVPB 500 mg  Status:  Discontinued     500 mg 100 mL/hr over 60 Minutes Intravenous Every 8 hours 07/24/18 1044 07/24/18 1722      Lab Results:  Recent Labs    07/30/18 0427 07/31/18 0340  WBC 9.5 9.0  HGB 10.0* 9.4*  HCT 30.2* 28.8*  PLT 192 204   BMET Recent Labs    07/30/18 0427 07/30/18 1514  NA 135 134*  K 3.0* 3.9  CL 102 100  CO2 27 27  GLUCOSE 150* 165*  BUN 17 16  CREATININE 0.53 0.57  CALCIUM 7.9* 8.3*   PT/INR No results for input(s): LABPROT, INR in the last 72 hours. CMP     Component Value Date/Time   NA 134 (L) 07/30/2018 1514   NA 132 (L) 07/22/2018 1632   NA 134 (L) 06/06/2017 1259   K 3.9 07/30/2018 1514   K 4.5 06/06/2017 1259   CL 100 07/30/2018 1514   CO2 27 07/30/2018 1514   CO2 24 06/06/2017 1259   GLUCOSE 165 (H) 07/30/2018 1514   GLUCOSE 311 (H) 06/06/2017 1259   BUN 16 07/30/2018 1514   BUN 32 (H) 07/22/2018 1632  BUN 18.3 06/06/2017 1259   CREATININE 0.57 07/30/2018 1514   CREATININE 1.0 06/06/2017 1259   CALCIUM 8.3 (L) 07/30/2018 1514   CALCIUM 10.6 (H) 06/06/2017 1259   PROT 4.7 (L) 07/26/2018 0311   PROT 6.5 07/22/2018 1632   PROT 7.7 06/06/2017 1259   ALBUMIN 1.6 (L) 07/30/2018 1514   ALBUMIN 3.5 07/22/2018 1632   ALBUMIN 3.3 (L) 06/06/2017 1259   AST 26 07/26/2018 0311   AST 17 06/06/2017 1259   ALT 15 07/26/2018 0311   ALT 14 06/06/2017 1259   ALKPHOS 66 07/26/2018 0311   ALKPHOS 115 06/06/2017 1259   BILITOT  3.1 (H) 07/26/2018 0311   BILITOT 0.9 07/22/2018 1632   BILITOT 0.52 06/06/2017 1259   GFRNONAA >60 07/30/2018 1514   GFRNONAA 80 06/22/2015 1614   GFRAA >60 07/30/2018 1514   GFRAA >89 06/22/2015 1614   Lipase     Component Value Date/Time   LIPASE 24 07/24/2018 0944    Studies/Results: No results found.    Kalman Drape , [facility] 07/31/2018, 8:03 AM  Pager: [phone] Mon-Wed, Friday 7:00am-4:30pm Thurs 7am-11:30am  Consults: [phone]

## 2018-07-31 NOTE — Progress Notes (Signed)
Inpatient Rehabilitation Admissions Coordinator  I met with patient at bedside to begin discussions concerning a possible inpt rehab admit when patient medically ready.We discussed goals and expectations of inpt rehab. She prefers inpt rehab rather than SNF. Noted possible ileus issues. I will follow her progress. Her children are in [place]. Husband would be her caregiver support.  Danne Baxter, RN, MSN Rehab Admissions Coordinator [phone] 07/31/2018 11:47 AM

## 2018-07-31 NOTE — Care Management Note (Signed)
Case Management Note  Patient Details  Name: Melody Casey MRN: [id number] Date of Birth: 09/03/1946  Subjective/Objective:     Pt admitted with sepsis secondary to bowel perf.  Pt is now s/p hartmans colectomy.                 Action/Plan:  Pt is from home.  CM discussed potential LTACH referral with surgery however pt was deemed more appropriate for CIR or SNF.  CIR following and CSW following for back up plan.   Expected Discharge Date:                  Expected Discharge Plan:  IP Rehab Facility  In-House Referral:  Clinical Social Work  Discharge planning Services  CM Consult  Post Acute Care Choice:    Choice offered to:     DME Arranged:    DME Agency:     HH Arranged:    HH Agency:     Status of Service:     If discussed at [employer], dates discussed:    Additional Comments: 07/31/2018  Pt remains on IV heparin, maint fluids, IV pepcid and IV phosphate.  CM will continue to follow  Maryclare Labrador, RN 07/31/2018, 4:08 PM

## 2018-07-31 NOTE — Progress Notes (Signed)
Dr. Marthenia Rolling wanted surgery to be paged on the role of NG tube insertion due to possible ileus. Claiborne Billings PA, from surgery would like to wait on the NG for right now. Pt isn't nausea or vomiting. Will continue to monitor patient.

## 2018-07-31 NOTE — Progress Notes (Addendum)
Left lower extremity venous duplex completed. Preliminary results -  There is no obvious evidence of DVT or Baker's cyst. The peroneal veins were difficult to image. [place] Jocelyn Nold,RVS  07/31/2018 3:55 pm

## 2018-07-31 NOTE — Clinical Social Work Note (Signed)
Clinical Social Work Assessment  Patient Details  Name: Melody Casey MRN: [id number] Date of Birth: 08/03/1946  Date of referral:  07/31/18               Reason for consult:  Facility Placement, Discharge Planning                Permission sought to share information with:  Family Supports Permission granted to share information::  No  Name::     none given  Agency::  none given  Relationship::  none given   Contact Information:  none given  Housing/Transportation Living arrangements for the past 2 months:  Single Family Home(with husband per pt report. ) Source of Information:  Patient Patient Interpreter Needed:  None Criminal Activity/Legal Involvement Pertinent to Current Situation/Hospitalization:  No - Comment as needed Significant Relationships:  Adult Children, Spouse Lives with:  Spouse Do you feel safe going back to the place where you live?  Yes Need for family participation in patient care:  Yes (Comment)  Care giving concerns:  CSW consulted for SNF placement as a backup option to CIR.    Social Worker assessment / plan:  CSW spoke with pt at bedside. CSW was informed that pt is from home with spouse. Pt expressed that spouse helps at best.   CSW spoke with pt about CIR placement and SNF placement as a backup to CIR. Pt expressed being agreeable to either placement at this time.   Employment status:  Retired Health visitor PT Recommendations:  Inpatient [place] / Referral to community resources:  [facility](spoke with pt about SNF as a backup option to [employer]  Patient/Family's Response to care:  Pt appeared to be tired and lethargic while CSW was speaking with pt. Pt was able to answer questions appropriately, however appeared to be tired.  Patient/Family's Understanding of and Emotional Response to Diagnosis, Current Treatment, and Prognosis:  No further questions or concerns have been presented to CSW at this time.    Emotional Assessment Appearance:  Appears stated age Attitude/Demeanor/Rapport:  Lethargic Affect (typically observed):  Flat Orientation:  Oriented to Situation, Oriented to Self, Oriented to Place, Oriented to  Time Alcohol / Substance use:  Not Applicable Psych involvement (Current and /or in the community):  No (Comment)  Discharge Needs  Concerns to be addressed:  Denies Needs/Concerns at this time Readmission within the last 30 days:  No Current discharge risk:  Dependent with Mobility Barriers to Discharge:  No Barriers Identified   Wetzel Bjornstad, [place] 07/31/2018, 9:06 AM

## 2018-07-31 NOTE — Consult Note (Signed)
Physical Medicine and Rehabilitation Consult Reason for Consult: Decreased functional mobility Referring Physician: Internal medicine   HPI: Melody Casey is a 72 y.o. right-handed female with history of Rheumatoid arthritis CAD/non-STEMI, atrial fibrillation on chronic Coumadin and Tikosyn, infrarenal AAA, history of breast cancer as well as frequent recurrent sigmoid diverticulitis.  Per chart review patient lives with spouse.  Reported to be independent and active prior to admission.  One level home with 3 steps to entry.  Presented 07/24/2018 with persistent abdominal pain x5 days left lower quadrant.  A CT scan showed extensive sigmoid diverticulitis with evidence of perforation containing air and stool.  She had a small focus of pneumoperitoneum.  Noted INR upon admission of 3.0 and reversed.  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 Donne Hazel.  Hospital course pain management.  Follow-up cardiology services for history of A. fib with RVR she did require pressors.  Patient currently remains off chronic Coumadin.  IV heparin has been initiated.  Acute blood loss anemia 9.4.  Therapy evaluation completed with recommendations of physical medicine rehab consult.   Review of Systems  Constitutional: Negative for chills and fever.  HENT: Negative for hearing loss.   Eyes: Negative for blurred vision and double vision.  Respiratory: Negative for cough and shortness of breath.   Cardiovascular: Positive for palpitations.  Gastrointestinal: Positive for abdominal pain and nausea.  Genitourinary: Negative for dysuria, flank pain and hematuria.  Musculoskeletal: Positive for myalgias.  Skin: Negative for rash.  Psychiatric/Behavioral:       Anxiety  All other systems reviewed and are negative.  Past Medical History:  Diagnosis Date  . AAA (abdominal aortic aneurysm) ([place])   . Acute diverticulitis   . Anxiety   . Arthritis   . Atrial fibrillation ([place])    a. Dx 03/2011 - on tikosyn/coumadin.  Marland Kitchen CAD (coronary artery disease)    a. NSTEMI 02/2011: occ mid Cx, DES to OM2, residual nonobst LAD dz.  . Cancer ([place])    breast  . Diabetes mellitus   . Diverticulitis   . Diverticulitis of colon     2008, 04/2011, 12/2014, 08/2015  . 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 Hemangioma    liver  . HLD (hyperlipidemia)   . HTN (hypertension)   . Osteoporosis   . Tobacco abuse    stopped smoking 2012  . Ureteral obstruction    History of gross hematuria/right hydronephrosis 2/2 to uteropelvic junction obstruction, s/p cystoscopy in January 2007 with bilateral retrograde pyelography, right ureter arthroscopy, right ureteral stent placement, bladder biopsies, stent removal since then.  . Wears dentures    top   Past Surgical History:  Procedure Laterality Date  . BIOPSY N/A 11/01/2015   Procedure: BIOPSY;  Surgeon: Danie Binder, MD;  Location: [facility];  Service: Endoscopy;  Laterality: N/A;  . BREAST LUMPECTOMY WITH NEEDLE LOCALIZATION AND AXILLARY SENTINEL LYMPH NODE BX Right 03/01/2014   Procedure: BREAST LUMPECTOMY WITH NEEDLE LOCALIZATION AND AXILLARY SENTINEL LYMPH NODE BX;  Surgeon: Edward Jolly, MD;  Location: [place];  Service: General;  Laterality: Right;  . BREAST SURGERY    . CARDIAC CATHETERIZATION  2012   stent  . COLON RESECTION N/A 07/25/2018   Procedure: COLOSTOMY;  Surgeon: Rolm Bookbinder, MD;  Location: [place];  Service: General;  Laterality: N/A;  . COLONOSCOPY WITH PROPOFOL N/A 06/29/2014   Dr. Wynetta Emery: universal diverticulosis  . CORONARY STENT PLACEMENT  2012  .  ESOPHAGOGASTRODUODENOSCOPY (EGD) WITH PROPOFOL N/A 11/01/2015   Procedure: ESOPHAGOGASTRODUODENOSCOPY (EGD) WITH PROPOFOL;  Surgeon: Danie Binder, MD;  Location: [facility];  Service: Endoscopy;  Laterality: N/A;  . KNEE ARTHROSCOPY     left  . LAPAROTOMY N/A 07/25/2018   Procedure: EXPLORATORY LAPAROTOMY;  Surgeon: Rolm Bookbinder,  MD;  Location: [place];  Service: General;  Laterality: N/A;  . PARTIAL COLECTOMY N/A 07/25/2018   Procedure: COLECTOMY;  Surgeon: Rolm Bookbinder, MD;  Location: [place];  Service: General;  Laterality: N/A;  . Right leg surgery  age 23   As a child for  ?scleroderma per patient  . TONSILLECTOMY AND ADENOIDECTOMY    . TUBAL LIGATION    . Ureteral surgery  2011   rt ureterostomy-stent   Family History  Problem Relation Age of Onset  . Lung cancer Father 88       deceased  . Cancer Father   . Heart failure Mother 53       deceased  . Heart disease Mother   . Diabetes Brother   . Hypertension Brother   . Cancer Brother        prostate  . Colon cancer Neg Hx   . Liver disease Neg Hx    Social History:  reports that she quit smoking about 7 years ago. Her smoking use included cigarettes. She has a 100.00 pack-year smoking history. She has never used smokeless tobacco. She reports that she does not drink alcohol or use drugs. Allergies:  Allergies  Allergen Reactions  . Miralax [Polyethylene Glycol] Swelling and Rash    Took [facility] brand developed rash. Patient states she tolerated name brand MiraLax in the past.  09/01/15. Patient states she had diffuse swelling including swelling of her lips.   . Vancomycin Rash and Shortness Of Breath  . Acetaminophen Hives  . Oxycodone-Acetaminophen Itching  . Sulfa Antibiotics Rash    All over rash  . Sulfacetamide Sodium Rash    All over rash  . Sulfasalazine Rash    All over rash All over rash  . Banana Other (See Comments)    Unknown  . Latex Rash  . Tape Rash   Medications Prior to Admission  Medication Sig Dispense Refill  . ALPRAZolam (XANAX) 0.25 MG tablet Take 1 tablet (0.25 mg total) by mouth daily as needed for anxiety. 30 tablet 1  . amLODipine (NORVASC) 2.5 MG tablet Take 1 tablet (2.5 mg total) by mouth daily. Please call to make appointment for further refills. 90 tablet 0  . anastrozole (ARIMIDEX) 1 MG tablet Take 1 tablet  (1 mg total) by mouth daily. 90 tablet 3  . atorvastatin (LIPITOR) 40 MG tablet Take 1 1/2 tablets daily 45 tablet 6  . ciprofloxacin (CIPRO) 500 MG tablet Take 1 tablet (500 mg total) by mouth 2 (two) times daily for 10 days. 20 tablet 0  . dofetilide (TIKOSYN) 250 MCG capsule Take 1 capsule (250 mcg total) by mouth 2 (two) times daily. 180 capsule 3  . hydroxychloroquine (PLAQUENIL) 200 MG tablet Take by mouth 2 (two) times daily.    Marland Kitchen KLOR-CON M20 20 MEQ tablet TAKE 1 TABLET BY MOUTH TWICE DAILY (Patient taking differently: Take 20 mEq by mouth daily. ) 180 tablet 1  . linaclotide (LINZESS) 145 MCG CAPS capsule Take 1 capsule (145 mcg total) by mouth daily before breakfast. 30 capsule 0  . lisinopril (PRINIVIL,ZESTRIL) 40 MG tablet TAKE 1 TABLET BY MOUTH IN THE MORNING 90 tablet 1  . magnesium  oxide (MAG-OX) 400 MG tablet Take 1 tablet (400 mg total) by mouth daily. Take 400 mg twice a day with food (Patient taking differently: Take 400 mg by mouth daily. )    . metFORMIN (GLUCOPHAGE) 500 MG tablet TAKE 1 TABLET BY MOUTH ONCE DAILY 90 tablet 0  . metroNIDAZOLE (FLAGYL) 500 MG tablet Take 1 tablet (500 mg total) by mouth 2 (two) times daily for 10 days. 20 tablet 0  . nitroGLYCERIN (NITROSTAT) 0.4 MG SL tablet Place 1 tablet (0.4 mg total) under the tongue every 5 (five) minutes as needed for chest pain. 25 tablet 5  . warfarin (COUMADIN) 5 MG tablet Take 1 tablet daily except on Thursdays take 1 and 1/2 tablets. (Patient taking differently: Take 5 mg by mouth daily. ) 102 tablet 0  . amoxicillin (AMOXIL) 500 MG capsule Take 1 capsule (500 mg total) by mouth 3 (three) times daily. (Patient not taking: Reported on 07/24/2018) 21 capsule 0    Home: Home Living Family/patient expects to be discharged to:: Private residence Living Arrangements: Spouse/significant other Available Help at Discharge: Family, Available 24 hours/day Type of Home: House Home Access: Stairs to enter [employer] of Steps: 3 Entrance Stairs-Rails: Right, Left, Can reach both Home Layout: One level Bathroom Shower/Tub: Multimedia programmer: Handicapped height Home Equipment: Environmental consultant - 2 wheels  Functional History: Prior Function Level of Independence: Independent Comments: Enjo[place] walking[place] Functional Status:  Mobility: Bed Mobility Overal bed mobility: Needs Assistance Bed Mobility: Rolling, Sidelying to Sit Rolling: Mod assist Sidelying to sit: Mod assist Sit to sidelying: Mod assist General bed mobility comments: cues for log roll technique for comfort, guided LEs over EOB, assisted to raise trunk and position hips at EOB with bed pad, increased time Transfers Overall transfer level: Needs assistance Equipment used: Rolling walker (2 wheeled) Transfer via Lift Equipment: Stedy Transfers: Sit to/from Stand Sit to Stand: Mod assist, +2 physical assistance, +2 safety/equipment Stand pivot transfers: +2 physical assistance, Mod assist General transfer comment: ModA+2 to assist trunk elevation and cue hip extension; pt remaining flexed forward, limited by pain and nevery coming fully upright. Stood from bed and [facility] Ambulation/Gait Ambulation/Gait assistance: Mod assist, +2 physical assistance, +2 safety/equipment Gait Distance (Feet): 3 Feet Assistive device: Rolling walker (2 wheeled) Gait Pattern/deviations: Step-to pattern, Antalgic, Trunk flexed General Gait Details: Took pivotal steps from bed to [facility] then 4x more steps forward with RW and modA+2; maintaining forward flexed posture (c/o pain and weakness) despite max cues to fully extend. C/o BLE feeling heavy Gait velocity: Decreased Gait velocity interpretation: <1.31 ft/sec, indicative of household ambulator    ADL: ADL Overall ADL's : Needs assistance/impaired Eating/Feeding: Set up, Sitting Eating/Feeding Details (indicate cue type and reason): drinking water Grooming: Sitting, Moderate  assistance Upper Body Bathing: Moderate assistance, Sitting Lower Body Bathing: +2 for physical assistance, Sit to/from stand, Total assistance Upper Body Dressing : Moderate assistance, Sitting Lower Body Dressing: +2 for physical assistance, Sit to/from stand, Total assistance Toilet Transfer: +2 for physical assistance, Moderate assistance, Stand-pivot, BSC, RW Toileting- Clothing Manipulation and Hygiene: Total assistance, +2 for physical assistance, Sit to/from stand General ADL Comments: pt reporting her feet feel heavy, flexed posture in standing  Cognition: Cognition Overall Cognitive Status: Impaired/Different from baseline Orientation Level: Oriented X4 Cognition Arousal/Alertness: Awake/alert Behavior During Therapy: Flat affect Overall Cognitive Status: Impaired/Different from baseline Area of Impairment: Problem solving Following Commands: Follows one step commands with increased time Problem Solving: Slow processing, Decreased  initiation, Requires verbal cues General Comments: Pt with slower processing compared to initial evaluation  Blood pressure 108/74, pulse 73, temperature 99.6 F (37.6 C), temperature source Oral, resp. rate 14, height 5\' 8"  (1.727 m), weight 81.7 kg, SpO2 94 %. Physical Exam  Vitals reviewed. Constitutional: She is oriented to person, place, and time.  Very frail appearing  HENT:  Head: Normocephalic and atraumatic.  Eyes: Pupils are equal, round, and reactive to light. EOM are normal.  Neck: Normal range of motion. Neck supple. No thyromegaly present.  Cardiovascular: Normal rate. Exam reveals no friction rub.  No murmur heard.    Respiratory: Effort normal and breath sounds normal. No respiratory distress. She has no wheezes.  GI: Bowel sounds are normal. She exhibits no distension.  colostomy  Musculoskeletal:  Right foot deformed, atrophic  Neurological: She is alert and oriented to person, place, and time. No cranial nerve deficit.    UE 4/5. LLE: 2-3/5 RLE: 1-2/5 prox to distal. Senses pain  Psychiatric:  Very flat, appears depressed    Results for orders placed or performed during the hospital encounter of 07/24/18 (from the past 24 hour(s))  Glucose, capillary     Status: Abnormal   Collection Time: 07/30/18 11:07 AM  Result Value Ref Range   Glucose-Capillary 119 (H) 70 - 99 mg/dL   Comment 1 Notify RN   Glucose, capillary     Status: Abnormal   Collection Time: 07/30/18  3:00 PM  Result Value Ref Range   Glucose-Capillary 149 (H) 70 - 99 mg/dL   Comment 1 Notify RN   Renal function panel     Status: Abnormal   Collection Time: 07/30/18  3:14 PM  Result Value Ref Range   Sodium 134 (L) 135 - 145 mmol/L   Potassium 3.9 3.5 - 5.1 mmol/L   Chloride 100 98 - 111 mmol/L   CO2 27 22 - 32 mmol/L   Glucose, Bld 165 (H) 70 - 99 mg/dL   BUN 16 8 - 23 mg/dL   Creatinine, Ser 0.57 0.44 - 1.00 mg/dL   Calcium 8.3 (L) 8.9 - 10.3 mg/dL   Phosphorus 2.9 2.5 - 4.6 mg/dL   Albumin 1.6 (L) 3.5 - 5.0 g/dL   GFR calc non Af Amer >60 >60 mL/min   GFR calc Af Amer >60 >60 mL/min   Anion gap 7 5 - 15  Glucose, capillary     Status: Abnormal   Collection Time: 07/30/18  7:45 PM  Result Value Ref Range   Glucose-Capillary 129 (H) 70 - 99 mg/dL   Comment 1 Notify RN   Glucose, capillary     Status: Abnormal   Collection Time: 07/30/18 11:03 PM  Result Value Ref Range   Glucose-Capillary 128 (H) 70 - 99 mg/dL   Comment 1 Capillary Specimen   Glucose, capillary     Status: Abnormal   Collection Time: 07/31/18  3:37 AM  Result Value Ref Range   Glucose-Capillary 120 (H) 70 - 99 mg/dL   Comment 1 Capillary Specimen   Heparin level (unfractionated)     Status: None   Collection Time: 07/31/18  3:40 AM  Result Value Ref Range   Heparin Unfractionated 0.31 0.30 - 0.70 IU/mL  CBC     Status: Abnormal   Collection Time: 07/31/18  3:40 AM  Result Value Ref Range   WBC 9.0 4.0 - 10.5 K/uL   RBC 3.36 (L) 3.87 - 5.11 MIL/uL    Hemoglobin 9.4 (L) 12.0 - 15.0  g/dL   HCT 28.8 (L) 36.0 - 46.0 %   MCV 85.7 78.0 - 100.0 fL   MCH 28.0 26.0 - 34.0 pg   MCHC 32.6 30.0 - 36.0 g/dL   RDW 18.1 (H) 11.5 - 15.5 %   Platelets 204 150 - 400 K/uL  Glucose, capillary     Status: Abnormal   Collection Time: 07/31/18  7:23 AM  Result Value Ref Range   Glucose-Capillary 121 (H) 70 - 99 mg/dL   Comment 1 Capillary Specimen    Comment 2 Notify RN    No results found.   Assessment/Plan: Diagnosis: Debility related to perforated sigmoid colon, multiple medical with history of RA 1. Does the need for close, 24 hr/day medical supervision in concert with the patient's rehab needs make it unreasonable for this patient to be served in a less intensive setting? Yes 2. Co-Morbidities requiring supervision/potential complications: nutrition, wound and ostomy care, pain mgt, mood 3. Due to bladder management, bowel management, safety, skin/wound care, disease management, medication administration, pain management and patient education, does the patient require 24 hr/day rehab nursing? Yes 4. Does the patient require coordinated care of a physician, rehab nurse, PT (1-2 hrs/day, 5 days/week) and OT (1-2 hrs/day, 5 days/week) to address physical and functional deficits in the context of the above medical diagnosis(es)? Yes Addressing deficits in the following areas: balance, endurance, locomotion, strength, transferring, bowel/bladder control, bathing, dressing, feeding, grooming, toileting and psychosocial support 5. Can the patient actively participate in an intensive therapy program of at least 3 hrs of therapy per day at least 5 days per week? Yes 6. The potential for patient to make measurable gains while on inpatient rehab is good 7. Anticipated functional outcomes upon discharge from inpatient rehab are supervision and min assist  with PT, supervision and min assist with OT, n/a with SLP. 8. Estimated rehab length of stay to reach the above  functional goals is: 14-20 days 9. Anticipated D/C setting: Home 10. Anticipated post D/C treatments: [place] therapy 11. Overall Rehab/Functional Prognosis: excellent  RECOMMENDATIONS: This patient's condition is appropriate for continued rehabilitative care in the following setting: CIR Patient has agreed to participate in recommended program. Yes Note that insurance prior authorization may be required for reimbursement for recommended care.  Comment: Rehab Admissions Coordinator to follow up.  Thanks,  [name], MD, Mellody Drown  I have personally performed a face to face diagnostic evaluation of this patient. Additionally, I have reviewed and concur with the physician assistant's documentation above.    Lavon Paganini Angiulli, PA-C 07/31/2018

## 2018-08-01 ENCOUNTER — Encounter: Payer: Self-pay | Admitting: *Deleted

## 2018-08-01 LAB — MAGNESIUM: Magnesium: 1.6 mg/dL — ABNORMAL LOW (ref 1.7–2.4)

## 2018-08-01 LAB — CBC
HCT: 27.5 % — ABNORMAL LOW (ref 36.0–46.0)
HEMOGLOBIN: 8.9 g/dL — AB (ref 12.0–15.0)
MCH: 27.7 pg (ref 26.0–34.0)
MCHC: 32.4 g/dL (ref 30.0–36.0)
MCV: 85.7 fL (ref 78.0–100.0)
Platelets: 216 10*3/uL (ref 150–400)
RBC: 3.21 MIL/uL — AB (ref 3.87–5.11)
RDW: 18.3 % — ABNORMAL HIGH (ref 11.5–15.5)
WBC: 9.4 10*3/uL (ref 4.0–10.5)

## 2018-08-01 LAB — RENAL FUNCTION PANEL
Albumin: 1.5 g/dL — ABNORMAL LOW (ref 3.5–5.0)
Anion gap: 4 — ABNORMAL LOW (ref 5–15)
BUN: 10 mg/dL (ref 8–23)
CO2: 25 mmol/L (ref 22–32)
Calcium: 8.2 mg/dL — ABNORMAL LOW (ref 8.9–10.3)
Chloride: 101 mmol/L (ref 98–111)
Creatinine, Ser: 0.43 mg/dL — ABNORMAL LOW (ref 0.44–1.00)
GFR calc Af Amer: >60 mL/min (ref >60)
GFR calc non Af Amer: >60 mL/min (ref >60)
Glucose, Bld: 96 mg/dL (ref 70–99)
Phosphorus: 2.4 mg/dL — ABNORMAL LOW (ref 2.5–4.6)
Potassium: 3.8 mmol/L (ref 3.5–5.1)
Sodium: 130 mmol/L — ABNORMAL LOW (ref 135–145)

## 2018-08-01 LAB — GLUCOSE, CAPILLARY
GLUCOSE-CAPILLARY: 81 mg/dL (ref 70–99)
GLUCOSE-CAPILLARY: 78 mg/dL (ref 70–99)
GLUCOSE-CAPILLARY: 82 mg/dL (ref 70–99)
Glucose-Capillary: 106 mg/dL — ABNORMAL HIGH (ref 70–99)
Glucose-Capillary: 105 mg/dL — ABNORMAL HIGH (ref 70–99)
Glucose-Capillary: 94 mg/dL (ref 70–99)

## 2018-08-01 LAB — HEPARIN LEVEL (UNFRACTIONATED): Heparin Unfractionated: 0.32 IU/mL (ref 0.30–0.70)

## 2018-08-01 MED ORDER — MAGNESIUM SULFATE 2 GM/50ML IV SOLN
2.0000 g | Freq: Once | INTRAVENOUS | Status: AC
  Administered 2018-08-01: 2 g via INTRAVENOUS
  Filled 2018-08-01: qty 50

## 2018-08-01 MED ORDER — POTASSIUM PHOSPHATES 15 MMOLE/5ML IV SOLN
20.0000 mmol | Freq: Once | INTRAVENOUS | Status: AC
  Administered 2018-08-01: 20 mmol via INTRAVENOUS
  Filled 2018-08-01: qty 6.67

## 2018-08-01 MED ORDER — POTASSIUM CHLORIDE 10 MEQ/100ML IV SOLN
10.0000 meq | INTRAVENOUS | Status: AC
  Administered 2018-08-01 (×2): 10 meq via INTRAVENOUS
  Filled 2018-08-01 (×2): qty 100

## 2018-08-01 NOTE — Progress Notes (Signed)
[facility]/Trauma Progress Note  7 Days Post-Op   Assessment/Plan Principal Problem: Sepsis ([place]) Active Problems: Type 2 diabetes mellitus with complication, without long-term current use of insulin (HCC) CAD (coronary artery disease) Anxiety and depression AAA (abdominal aortic aneurysm) without rupture (HCC) Microcytic anemia Diverticulosis Acute diverticulitis of intestine Bowel perforation (HCC) Hyponatremia  Bowel perforation - S/P Hartman's (left colectomy, end transverse colostomy), Dr. Donne Hazel, 08/16, POD#6 - await bowel function,likelyileus per and xray, small amt of stool and gas in bag this am  FEN:NPO, sips & chips VTE: SCD's, lovenox PI:RJJOA 08/15-08/21 Follow up:Dr. Donne Hazel  Plan: Encourage ambulation, await return of bowel function. May need TPN but small amt of gas and stool in bag this am so will hold off for now    LOS: 8 days    Subjective: CC: Right sided abdominal pain and mild intermittent nausea  No issues overnight. No vomiting. No family at bedside.   Objective: Vital signs in last 24 hours: Temp:  [97.4 F (36.3 C)-99.6 F (37.6 C)] 99.6 F (37.6 C) (08/23 0724) Pulse Rate:  [76-91] 91 (08/23 0600) Resp:  [13-25] 21 (08/23 0600) BP: (111-145)/(53-78) 125/61 (08/23 0600) SpO2:  [92 %-98 %] 95 % (08/23 0600) Last BM Date: 07/24/18  Intake/Output from previous day: 08/22 0701 - 08/23 0700 In: 1942.2 [P.O.:540; I.V.:550.9; IV Piggyback:851.4] Out: 1070 [Urine:1030; Drains:40] Intake/Output this shift: No intake/output data recorded.  PE: Gen: Alert, NAD Pulm:Rate andeffort normal Abd: Soft,mild distention,+BS, scant green liquid stool and air in bag,stoma pinkand viable, drain with serous drainage, TTP right side of abdomen without guarding, midlinedressed. Skin: no rashes noted, warm and dry   Anti-infectives: Anti-infectives (From admission, onward)   Start      Dose/Rate Route Frequency Ordered Stop   07/24/18 1800  piperacillin-tazobactam (ZOSYN) IVPB 3.375 g     3.375 g 12.5 mL/hr over 240 Minutes Intravenous Every 8 hours 07/24/18 1725 07/31/18 1900   07/24/18 1045  cefTRIAXone (ROCEPHIN) 2 g in sodium chloride 0.9 % 100 mL IVPB  Status:  Discontinued     2 g 200 mL/hr over 30 Minutes Intravenous Every 24 hours 07/24/18 1044 07/24/18 1722   07/24/18 1045  metroNIDAZOLE (FLAGYL) IVPB 500 mg  Status:  Discontinued     500 mg 100 mL/hr over 60 Minutes Intravenous Every 8 hours 07/24/18 1044 07/24/18 1722      Lab Results:  Recent Labs    07/31/18 0340 08/01/18 0200  WBC 9.0 9.4  HGB 9.4* 8.9*  HCT 28.8* 27.5*  PLT 204 216   BMET Recent Labs    07/31/18 0934 08/01/18 0200  NA 131* 130*  K 3.4* 3.8  CL 100 101  CO2 26 25  GLUCOSE 119* 96  BUN 14 10  CREATININE 0.46 0.43*  CALCIUM 8.4* 8.2*   PT/INR No results for input(s): LABPROT, INR in the last 72 hours. CMP     Component Value Date/Time   NA 130 (L) 08/01/2018 0200   NA 132 (L) 07/22/2018 1632   NA 134 (L) 06/06/2017 1259   K 3.8 08/01/2018 0200   K 4.5 06/06/2017 1259   CL 101 08/01/2018 0200   CO2 25 08/01/2018 0200   CO2 24 06/06/2017 1259   GLUCOSE 96 08/01/2018 0200   GLUCOSE 311 (H) 06/06/2017 1259   BUN 10 08/01/2018 0200   BUN 32 (H) 07/22/2018 1632   BUN 18.3 06/06/2017 1259   CREATININE 0.43 (L) 08/01/2018 0200   CREATININE 1.0 06/06/2017 1259  CALCIUM 8.2 (L) 08/01/2018 0200   CALCIUM 10.6 (H) 06/06/2017 1259   PROT 4.7 (L) 07/26/2018 0311   PROT 6.5 07/22/2018 1632   PROT 7.7 06/06/2017 1259   ALBUMIN 1.5 (L) 08/01/2018 0200   ALBUMIN 3.5 07/22/2018 1632   ALBUMIN 3.3 (L) 06/06/2017 1259   AST 26 07/26/2018 0311   AST 17 06/06/2017 1259   ALT 15 07/26/2018 0311   ALT 14 06/06/2017 1259   ALKPHOS 66 07/26/2018 0311   ALKPHOS 115 06/06/2017 1259   BILITOT 3.1 (H) 07/26/2018 0311   BILITOT 0.9 07/22/2018 1632   BILITOT 0.52 06/06/2017 1259    GFRNONAA >60 08/01/2018 0200   GFRNONAA 80 06/22/2015 1614   GFRAA >60 08/01/2018 0200   GFRAA >89 06/22/2015 1614   Lipase     Component Value Date/Time   LIPASE 24 07/24/2018 0944    Studies/Results: Dg Abd Portable 1v  Result Date: 07/31/2018 CLINICAL DATA:  Abdominal pain EXAM: PORTABLE ABDOMEN - 1 VIEW COMPARISON:  07/25/2018 FINDINGS: Gastric catheter has been removed in the interval. The stomach is distended with air. Scattered large and small bowel gas is noted with slight prominence of the small bowel in the left abdomen. Ostomy is noted in the mid left abdomen as well. Degenerative changes of lumbar spine are seen. Surgical drain is noted inferiorly. IMPRESSION: Mild prominence of the small bowel in the left mid abdomen which may represent a mild postoperative ileus. Surgical drain in satisfactory position. Electronically Signed   By: Inez Catalina M.D.   On: 07/31/2018 08:48      Kalman Drape , [facility] 08/01/2018, 7:53 AM  Pager: [phone] Mon-Wed, Friday 7:00am-4:30pm Thurs 7am-11:30am  Consults: [phone]

## 2018-08-01 NOTE — Progress Notes (Signed)
Physical Therapy Treatment Patient Details Name: Melody Casey MRN: [id number] DOB: [birth year] Today's Date: 08/01/2018    History of Present Illness Pt is a 72 y.o. female admitted 07/24/18 with LLQ. 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 Course was complicated by shock and acidosis. ETT 8/16-8/19; self-extubated. PMH: HTN, HLD, diverticulitis, DM, breast ca, CAD, afib, anxiety, AAA.    PT Comments    Pt asleep on entry, once awake reluctantly agrees to therapy session. Pt request bed pan and is modA for rolling. Pt modAx2 for sidelying to sit EoB, with min A for steadying in seated until slight dizziness subsided. Pt with continued urge to urinate and requires modAx2 for sit>stand and pivot transfer to [facility]. Pt unable to clear R LE for stepping. Max Ax1 for sit>stand from [facility]. Pt able to maintain standing with modA until recliner placed behind her. Pt is experiencing increased weakness and pain from ileus. Pt educated on need to move especially in getting up to [facility]. Pt agreeable to seated exercise at end of session. D/c recommendations remain appropriate at this time. PT will continue to follow acutely.     Follow Up Recommendations  CIR;Supervision for mobility/OOB     Equipment Recommendations  Other (comment)(TBD)    Recommendations for Other Services       Precautions / Restrictions Precautions Precautions: Fall Precaution Comments: Colostomy, abdominal drain Restrictions Weight Bearing Restrictions: No    Mobility  Bed Mobility Overal bed mobility: Needs Assistance Bed Mobility: Rolling;Sidelying to Sit Rolling: Mod assist Sidelying to sit: Mod assist       General bed mobility comments: mod A to roll onto side, and bring LE off bed and trunk to upright, unable to scoot to EoB without pad assist  Transfers Overall transfer level: Needs assistance Equipment used: 2 person  hand held assist Transfers: Sit to/from Stand;Stand Pivot Transfers Sit to Stand: +2 physical assistance;Mod assist;Max assist Stand pivot transfers: +2 physical assistance;Mod assist       General transfer comment: modAx2 for sit to stand and stand transfer to [facility], difficulty moving LE even with aided weight shift and vc, maxAx1 for sit>stand from [facility] for pericare and then recliner moved in behind her to sit   Ambulation/Gait             General Gait Details: unable to attempt at this time due to increased weakness       Balance Overall balance assessment: Needs assistance   Sitting balance-Leahy Scale: Fair       Standing balance-Leahy Scale: Poor Standing balance comment: requires B UE and external support                            Cognition Arousal/Alertness: Awake/alert Behavior During Therapy: Flat affect Overall Cognitive Status: Impaired/Different from baseline Area of Impairment: Following commands;Problem solving                       Following Commands: Follows multi-step commands consistently     Problem Solving: Slow processing;Decreased initiation;Difficulty sequencing;Requires verbal cues;Requires tactile cues        Exercises General Exercises - Lower Extremity Ankle Circles/Pumps: AROM;Both;10 reps;Seated Long Arc Quad: AROM;Both;10 reps;Seated Hip Flexion/Marching: AROM;Both;10 reps;Seated    General Comments General comments (skin integrity, edema, etc.): VSS      Pertinent Vitals/Pain Pain Assessment: 0-10 Pain Score: 7  Pain Location: abdomen with bed mobility, B LEs Pain Descriptors / Indicators: Grimacing;Sore Pain Intervention(s): Limited activity within patient's tolerance;Monitored during session;Repositioned           PT Goals (current goals can now be found in the care plan section) Acute Rehab PT Goals Patient Stated Goal: "Get back to walking like I enjoy" PT Goal Formulation: With patient Time  For Goal Achievement: 08/12/18 Potential to Achieve Goals: Good Progress towards PT goals: Progressing toward goals    Frequency    Min 3X/week      PT Plan Current plan remains appropriate       AM-PAC PT "6 Clicks" Daily Activity  Outcome Measure  Difficulty turning over in bed (including adjusting bedclothes, sheets and blankets)?: Unable Difficulty moving from lying on back to sitting on the side of the bed? : Unable Difficulty sitting down on and standing up from a chair with arms (e.g., wheelchair, bedside commode, etc,.)?: Unable Help needed moving to and from a bed to chair (including a wheelchair)?: A Lot Help needed walking in hospital room?: Total Help needed climbing 3-5 steps with a railing? : Total 6 Click Score: 7    End of Session Equipment Utilized During Treatment: Gait belt;Oxygen Activity Tolerance: Patient limited by pain;Patient limited by fatigue Patient left: in chair;with call bell/phone within reach;with chair alarm set;with nursing/sitter in room Nurse Communication: Mobility status PT Visit Diagnosis: Other abnormalities of gait and mobility (R26.89);Muscle weakness (generalized) (M62.81)     Time: 0175-1025 PT Time Calculation (min) (ACUTE ONLY): 31 min  Charges:  $Therapeutic Exercise: 8-22 mins $Therapeutic Activity: 8-22 mins                     Shaylinn Hladik B. Migdalia Dk PT, DPT Acute Rehabilitation  [phone] Pager [phone]     [place] 08/01/2018, 3:29 PM

## 2018-08-01 NOTE — Progress Notes (Signed)
PROGRESS NOTE    Melody Casey  GUY:[id number] DOB: [birth year] DOA: 07/24/2018 PCP: Dettinger, [name], MD  Outpatient Specialists:   Brief Narrative:  Patient is a 72 year old female with past medical history significant for diverticulitis, atrial fibrillation on warfarin and Tikosyn, coronary artery disease status post stenting, diabetes, breast cancer, and hypertension.  She presented to [facility] emergency department on 8/15 with complaints of recent constipation and more recent left lower quadrant pain.  This pain began to radiate to the periumbilical area which is what caused her to present.  CT scan of the abdomen in the emergency department demonstrated a focal perforation of the anterior sigmoid wall of the colon and pneumoperitoneum.  She was admitted for stabilization and reversal of warfarin prior to being taken to the operating room on 8/16 where she underwent colectomy with colostomy placement.  Operative course complicated by shock and acidosis.  Was started on pressors and was postoperatively transferred to the ICU for further monitoring.  07/30/2018: Hospitalist service assumed patient's care today.  Patient has no new complaints.  Patient is not a particularly good historian.  07/31/2018: Patient seen.  There are concerns for possible ileus.  QTc interval is back to normal.  Electrolytes have been monitored and corrected.  Surgical team to advise on rule of NG tube to low suction.  Patient is a diabetic, but cannot use Reglan due to concerns for QTc interval.  Keep potassium above 4.  Keep magnesium above 2.  Potential phosphorus is within normal range.  Overall, patient looks better today.  08/01/2018: Chest seems to be improving.  Gas is noted in colostomy bag.  Small amount of stool also noted.  Bowel sounds have returned, though, still hypoactive.  We will continue to optimize electrolytes.  Will repeat abdominal x-ray in the morning.  Overall, patient seems to be  improving.   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 Postop management as per surgical team. 08/01/2018: Melody Casey see above.  Possible ileus: Surgical team to advise on role of NG tube to low suction. Keep potassium above 4. Keep magnesium above 2. Optimize phosphorus level. Continue to monitor. 08/01/2018: Kindly see above.  Paroxysmal atrial fibrillation/coronary artery disease status post PCI: Stable for now. Patient is on Tikosyn. Cardizem added. Continue to monitor QTc interval. Prolonged QTc interval is improving with correction of abnormal electrolytes. 08/01/2018: The patient is back to normal sinus rhythm.  QTC is within normal range.  Acute kidney injury: Resolved.  Hypokalemia: Continue to monitor and replete. 08/01/2018: Potassium today was 3.8.  We will give by the KCl 20 M EQ every 2 hours.  Hypophosphatemia: Continue to monitor and replete.  Hypertension/diabetes mellitus: Continue to optimize.  DVT prophylaxis: Heparin drip Code Status: Full code Family Communication:  Disposition Plan: Will depend on hospital course   Consultants:   Surgery  Cardiology  Critical care  Procedures:   Left colectomy and colostomy  Antimicrobials:   IV Zosyn   Subjective: Patient looks much better today.  Gas noted in colostomy bag. Some stool noted in colostomy bag. Abdomen is less distended.     No abdominal pain reported.     Objective: Vitals:   08/01/18 1500 08/01/18 1518 08/01/18 1600 08/01/18 1700  BP: 120/62  (!) 103/53 140/64  Pulse: 78  72 79  Resp:      Temp:  97.7 F (36.5 C) 97.7 F (36.5 C)   TempSrc:  Axillary Axillary   SpO2: 98%  99% 98%  Weight:      Height:        Intake/Output Summary (Last 24 hours) at 08/01/2018 1730 Last data filed at 08/01/2018 1700 Gross per 24 hour  Intake 1814.77 ml  Output 1070 ml  Net 744.77 ml   Filed Weights   07/24/18 0915 07/26/18 1311 07/29/18 0600  Weight: 72.6 kg 75.8 kg 81.7 kg    Examination:  General exam: Patient looks a lot better today.  Patient is awake, alert and oriented.  Patient is not in any distress.     Respiratory system: Clear to auscultation.  Cardiovascular system: S1 & S2 heard, Gastrointestinal system: Abdomen is less distended today, soft.  Bowel sounds are more appreciated today.     Central nervous system: Alert and oriented. No focal neurological deficits. Extremities: Swelling of the left lower extremity is improving.  Right toes seem to be chronically contracted.    Data Reviewed: I have personally reviewed following labs and imaging studies  CBC: Recent Labs  Lab 07/27/18 0452 07/28/18 0409 07/29/18 0414 07/30/18 0427 07/31/18 0340 08/01/18 0200  WBC 7.4 8.4 11.0* 9.5 9.0 9.4  NEUTROABS 6.5 7.1 9.1* 7.7  --   --   HGB 8.2* 8.6* 9.6* 10.0* 9.4* 8.9*  HCT 24.4* 25.6* 29.2* 30.2* 28.8* 27.5*  MCV 82.2 82.6 83.9 83.9 85.7 85.7  PLT 143* 164 200 192 204 505   Basic Metabolic Panel: Recent Labs  Lab 07/28/18 0409  07/29/18 0414 07/30/18 0427 07/30/18 1514 07/31/18 0934 08/01/18 0200  NA 142   < > 138 135 134* 131* 130*  K 2.9*   < > 3.7 3.0* 3.9 3.4* 3.8  CL 106   < > 104 102 100 100 101  CO2 28   < > 29 27 27 26 25   GLUCOSE 176*   < > 136* 150* 165* 119* 96  BUN 26*   < > 22 17 16 14 10   CREATININE 0.64   < > 0.61 0.53 0.57 0.46 0.43*  CALCIUM 8.5*   < > 8.2* 7.9* 8.3* 8.4* 8.2*  MG 1.4*  --   1.6* 1.6*  --  1.6* 1.6*  PHOS 1.8*  --  2.4* 2.2* 2.9 2.3*  2.4* 2.4*   < > = values in this interval not displayed.   GFR: Estimated Creatinine Clearance: 69.2 mL/min (A) (by C-G formula based on SCr of 0.43 mg/dL (L)). Liver Function Tests: Recent Labs  Lab 07/26/18 0311 07/30/18 1514 07/31/18 0934 08/01/18 0200  AST 26  --   --   --   ALT 15  --   --   --   ALKPHOS 66  --   --   --   BILITOT 3.1*  --   --   --   PROT 4.7*  --   --   --   ALBUMIN 2.0* 1.6* 1.6* 1.5*   No results for input(s): LIPASE, AMYLASE in the last 168 hours. No results for input(s): AMMONIA in the last 168 hours. Coagulation  Profile: No results for input(s): INR, PROTIME in the last 168 hours. Cardiac Enzymes: Recent Labs  Lab 07/27/18 0452  TROPONINI 0.03*   BNP (last 3 results) No results for input(s): PROBNP in the last 8760 hours. HbA1C: No results for input(s): HGBA1C in the last 72 hours. CBG: Recent Labs  Lab 07/31/18 2328 08/01/18 0406 08/01/18 0714 08/01/18 1208 08/01/18 1513  GLUCAP 80 81 78 106* 105*   Lipid Profile: No results for input(s): CHOL, HDL, LDLCALC, TRIG, CHOLHDL, LDLDIRECT in the last 72 hours. Thyroid Function Tests: No results for input(s): TSH, T4TOTAL, FREET4, T3FREE, THYROIDAB in the last 72 hours. Anemia Panel: No results for input(s): VITAMINB12, FOLATE, FERRITIN, TIBC, IRON, RETICCTPCT in the last 72 hours. Urine analysis:    Component Value Date/Time   COLORURINE YELLOW 09/01/2015 1800   APPEARANCEUR Cloudy (A) 06/30/2018 1345   LABSPEC 1.025 09/01/2015 1800   PHURINE 6.0 09/01/2015 1800   GLUCOSEU Negative 06/30/2018 1345   GLUCOSEU NEGATIVE 04/05/2008 0833   HGBUR SMALL (A) 09/01/2015 1800   BILIRUBINUR Negative 06/30/2018 1345   KETONESUR NEGATIVE 09/01/2015 1800   PROTEINUR Negative 06/30/2018 1345   PROTEINUR TRACE (A) 09/01/2015 1800   UROBILINOGEN negative 10/25/2015 0926   UROBILINOGEN 0.2 09/01/2015 1800   NITRITE Positive (A)  06/30/2018 1345   NITRITE NEGATIVE 09/01/2015 1800   LEUKOCYTESUR 1+ (A) 06/30/2018 1345   Sepsis Labs: @LABRCNTIP (procalcitonin:4,lacticidven:4)  ) Recent Results (from the past 240 hour(s))  Blood Culture (routine x 2)     Status: None   Collection Time: 07/24/18 11:07 AM  Result Value Ref Range Status   Specimen Description BLOOD LEFT ARM  Final   Special Requests   Final    BOTTLES DRAWN AEROBIC AND ANAEROBIC Blood Culture adequate volume   Culture   Final    NO GROWTH 5 DAYS Performed at [facility], [address], [place], [place] [address]    Report Status 07/29/2018 FINAL  Final  Blood Culture (routine x 2)     Status: None   Collection Time: 07/24/18 11:07 AM  Result Value Ref Range Status   Specimen Description BLOOD LEFT HAND  Final   Special Requests   Final    BOTTLES DRAWN AEROBIC AND ANAEROBIC Blood Culture adequate volume   Culture   Final    NO GROWTH 5 DAYS Performed at [facility], [address], [place], [place] [address]    Report Status 07/29/2018 FINAL  Final  Surgical pcr screen     Status: None   Collection Time: 07/24/18  8:19 PM  Result Value Ref Range Status   MRSA, PCR NEGATIVE NEGATIVE Final   Staphylococcus aureus NEGATIVE NEGATIVE Final    Comment: (NOTE) The Xpert SA Assay (FDA approved for NASAL specimens in patients 36 years of age and older), is one component of a comprehensive surveillance program. It is not intended to diagnose infection nor to guide or monitor treatment. Performed at [facility], [place] [address] [place],  [address]   MRSA PCR Screening     Status: None   Collection Time: 07/25/18  1:15 PM  Result Value Ref Range Status   MRSA by PCR NEGATIVE NEGATIVE Final    Comment:        The GeneXpert MRSA Assay (FDA approved for NASAL specimens only), is one component of a comprehensive MRSA colonization surveillance program. It is not intended to diagnose MRSA infection nor to guide or monitor  treatment for MRSA infections. Performed at [facility], [place] [address] [place], [place] [address]          Radiology Studies: Dg Abd Portable 1v  Result Date: 07/31/2018 CLINICAL DATA:  Abdominal pain EXAM: PORTABLE ABDOMEN - 1 VIEW COMPARISON:  07/25/2018 FINDINGS: Gastric catheter has been removed in the interval. The stomach is distended with air. Scattered large and small bowel gas is noted with slight prominence of the small bowel in the left abdomen. Ostomy is noted in the mid left abdomen as well. Degenerative changes of lumbar spine are seen. Surgical drain is noted inferiorly. IMPRESSION: Mild prominence of the small bowel in the left mid abdomen which may represent a mild postoperative ileus. Surgical drain in satisfactory position. Electronically Signed   By: Inez Catalina M.D.   On: 07/31/2018 08:48        Scheduled Meds: . diltiazem  90 mg Oral Q6H  . dofetilide  250 mcg Oral BID  . famotidine  20 mg Oral BID  . insulin aspart  0-9 Units Subcutaneous Q4H  . mouth rinse  15 mL Mouth Rinse BID   Continuous Infusions: . sodium chloride 10 mL/hr at 08/01/18 1700  . heparin 1,350 Units/hr (08/01/18 1700)     LOS: 8 days    Time spent: 25 minutes.    Dana Allan, MD  [facility] Pager #: [phone] 7PM-7AM contact night coverage as above

## 2018-08-01 NOTE — Progress Notes (Signed)
Occupational Therapy Treatment Patient Details Name: Melody Casey MRN: [id number] DOB: [birth year] Today's Date: 08/01/2018    History of present illness Pt is a 72 y.o. female admitted 07/24/18 with LLQ. 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 Course was complicated by shock and acidosis. ETT 8/16-8/19; self-extubated. PMH: HTN, HLD, diverticulitis, DM, breast ca, CAD, afib, anxiety, AAA.   OT comments  Pt completed grooming task bed level with min (A) and voiding on bed pan with request. Pt able to bridge buttock for bed pan and declined BSC. Pt resting comfortably at the end of session.    Follow Up Recommendations  CIR    Equipment Recommendations  3 in 1 bedside commode    Recommendations for Other Services      Precautions / Restrictions Precautions Precautions: Fall Precaution Comments: Colostomy, abdominal drain Restrictions Weight Bearing Restrictions: No       Mobility Bed Mobility Overal bed mobility: Needs Assistance Bed Mobility: Rolling;Sidelying to Sit Rolling: Mod assist Sidelying to sit: Mod assist       General bed mobility comments: bridging for bed pan in session  Transfers Overall transfer level: Needs assistance Equipment used: 2 person hand held assist Transfers: Sit to/from Stand;Stand Pivot Transfers Sit to Stand: +2 physical assistance;Mod assist;Max assist Stand pivot transfers: +2 physical assistance;Mod assist       General transfer comment: modAx2 for sit to stand and stand transfer to [facility], difficulty moving LE even with aided weight shift and vc, maxAx1 for sit>stand from [facility] for pericare and then recliner moved in behind her to sit     Balance Overall balance assessment: Needs assistance   Sitting balance-Leahy Scale: Fair       Standing balance-Leahy Scale: Poor Standing balance comment: requires B UE and external support                            ADL either performed or assessed with clinical judgement   ADL Overall ADL's : Needs assistance/impaired     Grooming: Wash/dry face;Oral care;Minimal assistance;Bed level Grooming Details (indicate cue type and reason): hob elevated and cues for sequence                   Toilet Transfer Details (indicate cue type and reason): bed pan mod (A)> pt (A) with BIL LE positioning and able to elevated buttock for bed pan placement . void of bladder and total (A) for hygiene. pt able to elevated again with A to maintain knee flexion by holding L LE ankle           General ADL Comments: bed level care this session due to RN request to keep patient bed level after returning from being up with PT earlier. RN reports painful from extended time up earlier     Vision       Perception     Praxis      Cognition Arousal/Alertness: Awake/alert Behavior During Therapy: Flat affect Overall Cognitive Status: Impaired/Different from baseline Area of Impairment: Following commands;Problem solving                       Following Commands: Follows multi-step commands consistently     Problem Solving: Slow processing;Decreased initiation General Comments: pt slower processing and needs cues for sequence of oral care. pt swallowing after rinsing mouth instead of spitting out  in cup.         Exercises Exercises: General Lower Extremity General Exercises - Lower Extremity Ankle Circles/Pumps: AROM;Both;10 reps;Seated Long Arc Quad: AROM;Both;10 reps;Seated Hip Flexion/Marching: AROM;Both;10 reps;Seated   Shoulder Instructions       General Comments VSS    Pertinent Vitals/ Pain       Pain Assessment: Faces Pain Score: 7  Faces Pain Scale: Hurts a little bit Pain Location: abdomen Pain Descriptors / Indicators: Grimacing Pain Intervention(s): Limited activity within patient's tolerance  Home Living                                           Prior Functioning/Environment              Frequency  Min 2X/week        Progress Toward Goals  OT Goals(current goals can now be found in the care plan section)  Progress towards OT goals: Progressing toward goals  Acute Rehab OT Goals Patient Stated Goal: none stated OT Goal Formulation: With patient Time For Goal Achievement: 08/13/18 Potential to Achieve Goals: Good ADL Goals Pt Will Perform Grooming: with min guard assist;standing Pt Will Perform Upper Body Dressing: with supervision;with set-up;sitting Pt Will Perform Lower Body Dressing: with min guard assist;sitting/lateral leans Pt Will Transfer to Toilet: with min guard assist;ambulating;bedside commode Pt Will Perform Toileting - Clothing Manipulation and hygiene: with min guard assist;sit to/from stand Additional ADL Goal #1: Pt will perform bed mobility with min guard assist in preparation for ADL.  Plan Discharge plan remains appropriate    Co-evaluation                 AM-PAC PT "6 Clicks" Daily Activity     Outcome Measure   Help from another person eating meals?: None Help from another person taking care of personal grooming?: A Lot Help from another person toileting, which includes using toliet, bedpan, or urinal?: A Lot Help from another person bathing (including washing, rinsing, drying)?: A Lot Help from another person to put on and taking off regular upper body clothing?: A Lot Help from another person to put on and taking off regular lower body clothing?: Total 6 Click Score: 13    End of Session Equipment Utilized During Treatment: Oxygen  OT Visit Diagnosis: Unsteadiness on feet (R26.81);Other abnormalities of gait and mobility (R26.89);Pain;Other symptoms and signs involving cognitive function;Muscle weakness (generalized) (M62.81)   Activity Tolerance Patient tolerated treatment well   Patient Left in bed;with call bell/phone within reach   Nurse  Communication Mobility status;Precautions        Time: 0962-8366 OT Time Calculation (min): 16 min  Charges: OT General Charges $OT Visit: 1 Visit OT Treatments $Self Care/Home Management : 8-22 mins   Jeri Modena   OTR/L Pager: [phone] Office: [phone] .    Parke Poisson B 08/01/2018, 5:11 PM

## 2018-08-01 NOTE — Progress Notes (Signed)
[place] for Heparin Indication: atrial fibrillation  Allergies  Allergen Reactions  . Miralax [Polyethylene Glycol] Swelling and Rash    Took [facility] brand developed rash. Patient states she tolerated name brand MiraLax in the past.  09/01/15. Patient states she had diffuse swelling including swelling of her lips.   . Vancomycin Rash and Shortness Of Breath  . Acetaminophen Hives  . Oxycodone-Acetaminophen Itching  . Sulfa Antibiotics Rash    All over rash  . Sulfacetamide Sodium Rash    All over rash  . Sulfasalazine Rash    All over rash All over rash  . Banana Other (See Comments)    Unknown  . Latex Rash  . Tape Rash    Patient Measurements: Height: 5\' 8"  (172.7 cm) Weight: 180 lb 1.9 oz (81.7 kg) IBW/kg (Calculated) : 63.9 Heparin Dosing Weight: 76 kg  Vital Signs: Temp: 99.6 F (37.6 C) (08/23 0800) Temp Source: Oral (08/23 0800) BP: 109/56 (08/23 0900) Pulse Rate: 80 (08/23 0900)  Labs: Recent Labs    07/30/18 0427 07/30/18 0540 07/30/18 1514 07/31/18 0340 07/31/18 0934 08/01/18 0200  HGB 10.0*  --   --  9.4*  --  8.9*  HCT 30.2*  --   --  28.8*  --  27.5*  PLT 192  --   --  204  --  216  HEPARINUNFRC  --  0.29*  --  0.31  --  0.32  CREATININE 0.53  --  0.57  --  0.46 0.43*    Estimated Creatinine Clearance: 69.2 mL/min (A) (by C-G formula based on SCr of 0.43 mg/dL (L)).   Medical History: Past Medical History:  Diagnosis Date  . AAA (abdominal aortic aneurysm) ([place])   . Acute diverticulitis   . Anxiety   . Arthritis   . Atrial fibrillation ([place])    a. Dx 03/2011 - on tikosyn/coumadin.  [place] CAD (coronary artery disease)    a. NSTEMI 02/2011: occ mid Cx, DES to OM2, residual nonobst LAD dz.  . Cancer ([place])    breast  . Diabetes mellitus   . Diverticulitis   . Diverticulitis of colon     2008, 04/2011, 12/2014, 08/2015  . 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 Hemangioma    liver  . HLD  (hyperlipidemia)   . HTN (hypertension)   . Osteoporosis   . Tobacco abuse    stopped smoking 2012  . Ureteral obstruction    History of gross hematuria/right hydronephrosis 2/2 to uteropelvic junction obstruction, s/p cystoscopy in January 2007 with bilateral retrograde pyelography, right ureter arthroscopy, right ureteral stent placement, bladder biopsies, stent removal since then.  . Wears dentures    top    Medications:  Heparin at 1300 units/hr  Assessment: 72 yo F on warfarin and dofetilide PTA for afib. Admitted on 8/15 with abdominal pain and found to have bowel perforation now s/p colectomy and colostomy replacement. Transferred to the ICU postoperatively. Heparin started for anticoagulation in place of PTA warfarin.   Heparin level is therapeutic at 0.32 on 1350 units/hr. H&H with slight trend down today (8.9/27.5). Platelets are stable. No bleeding or issues with infusion noted. Most orals on hold with possible ileus.   Goal of Therapy:  Heparin level 0.3-0.7 units/ml Monitor platelets by anticoagulation protocol: Yes   Plan:  Continue Heparin to 1350 units/hr to keep in goal.  Continue to monitor CBC, heparin level, s/s of bleeding daily Follow-up ability to take oral  anticoagulation.   Sloan Leiter, PharmD, BCPS, BCCCP Clinical Pharmacist Clinical phone 08/01/2018 until 3:30PM [phone] After hours, please call #[phone] 08/01/2018    9:39 AM

## 2018-08-02 ENCOUNTER — Inpatient Hospital Stay (HOSPITAL_COMMUNITY): Payer: Medicare Other

## 2018-08-02 LAB — GLUCOSE, CAPILLARY
GLUCOSE-CAPILLARY: 122 mg/dL — AB (ref 70–99)
GLUCOSE-CAPILLARY: 87 mg/dL (ref 70–99)
Glucose-Capillary: 80 mg/dL (ref 70–99)
Glucose-Capillary: 89 mg/dL (ref 70–99)

## 2018-08-02 LAB — RENAL FUNCTION PANEL
Albumin: 1.5 g/dL — ABNORMAL LOW (ref 3.5–5.0)
Anion gap: 5 (ref 5–15)
BUN: 8 mg/dL (ref 8–23)
CO2: 23 mmol/L (ref 22–32)
Calcium: 8.3 mg/dL — ABNORMAL LOW (ref 8.9–10.3)
Chloride: 100 mmol/L (ref 98–111)
Creatinine, Ser: 0.45 mg/dL (ref 0.44–1.00)
GFR calc Af Amer: >60 mL/min (ref >60)
GFR calc non Af Amer: >60 mL/min (ref >60)
Glucose, Bld: 86 mg/dL (ref 70–99)
Phosphorus: 2.5 mg/dL (ref 2.5–4.6)
Potassium: 4 mmol/L (ref 3.5–5.1)
Sodium: 128 mmol/L — ABNORMAL LOW (ref 135–145)

## 2018-08-02 LAB — HEPARIN LEVEL (UNFRACTIONATED): HEPARIN UNFRACTIONATED: 0.3 [IU]/mL (ref 0.30–0.70)

## 2018-08-02 LAB — CBC
HCT: 26.9 % — ABNORMAL LOW (ref 36.0–46.0)
HEMOGLOBIN: 8.5 g/dL — AB (ref 12.0–15.0)
MCH: 27.6 pg (ref 26.0–34.0)
MCHC: 31.6 g/dL (ref 30.0–36.0)
MCV: 87.3 fL (ref 78.0–100.0)
Platelets: 252 10*3/uL (ref 150–400)
RBC: 3.08 MIL/uL — AB (ref 3.87–5.11)
RDW: 18.6 % — ABNORMAL HIGH (ref 11.5–15.5)
WBC: 9.6 10*3/uL (ref 4.0–10.5)

## 2018-08-02 LAB — PHOSPHORUS: Phosphorus: 2.5 mg/dL (ref 2.5–4.6)

## 2018-08-02 LAB — MAGNESIUM: Magnesium: 1.7 mg/dL (ref 1.7–2.4)

## 2018-08-02 MED ORDER — IBUPROFEN 200 MG PO TABS: 400.0000 mg | ORAL_TABLET | Freq: Three times a day (TID) | ORAL | Status: DC | PRN

## 2018-08-02 MED ORDER — MAGNESIUM SULFATE IN D5W 1-5 GM/100ML-% IV SOLN
1.0000 g | Freq: Once | INTRAVENOUS | Status: AC
  Administered 2018-08-02: 1 g via INTRAVENOUS
  Filled 2018-08-02: qty 100

## 2018-08-02 MED ORDER — DILTIAZEM HCL 60 MG PO TABS
90.0000 mg | ORAL_TABLET | Freq: Four times a day (QID) | ORAL | Status: DC
  Administered 2018-08-02 – 2018-08-04 (×8): 90 mg via ORAL
  Filled 2018-08-02 (×2): qty 2
  Filled 2018-08-02: qty 1
  Filled 2018-08-02 (×4): qty 2
  Filled 2018-08-02: qty 1
  Filled 2018-08-02: qty 2

## 2018-08-02 MED ORDER — POTASSIUM PHOSPHATES 15 MMOLE/5ML IV SOLN
10.0000 mmol | Freq: Once | INTRAVENOUS | Status: AC
  Administered 2018-08-02: 10 mmol via INTRAVENOUS
  Filled 2018-08-02: qty 3.33

## 2018-08-02 MED ORDER — TRAMADOL HCL 50 MG PO TABS
50.0000 mg | ORAL_TABLET | Freq: Four times a day (QID) | ORAL | Status: DC | PRN
  Administered 2018-08-02 – 2018-08-05 (×10): 50 mg via ORAL
  Filled 2018-08-02 (×10): qty 1

## 2018-08-02 NOTE — Progress Notes (Signed)
[place] for Heparin Indication: atrial fibrillation  Allergies  Allergen Reactions  . Miralax [Polyethylene Glycol] Swelling and Rash    Took [facility] brand developed rash. Patient states she tolerated name brand MiraLax in the past.  09/01/15. Patient states she had diffuse swelling including swelling of her lips.   . Vancomycin Rash and Shortness Of Breath  . Acetaminophen Hives  . Oxycodone-Acetaminophen Itching  . Sulfa Antibiotics Rash    All over rash  . Sulfacetamide Sodium Rash    All over rash  . Sulfasalazine Rash    All over rash All over rash  . Banana Other (See Comments)    Unknown  . Latex Rash  . Tape Rash   Patient Measurements: Height: 5\' 8"  (172.7 cm) Weight: 180 lb 1.9 oz (81.7 kg) IBW/kg (Calculated) : 63.9 Heparin Dosing Weight: 76 kg  Vital Signs: Temp: 98.1 F (36.7 C) (08/24 0300) BP: 104/57 (08/24 0800) Pulse Rate: 83 (08/24 0900)  Labs: Recent Labs    07/31/18 0340 07/31/18 0934 08/01/18 0200 08/02/18 0227  HGB 9.4*  --  8.9* 8.5*  HCT 28.8*  --  27.5* 26.9*  PLT 204  --  216 252  HEPARINUNFRC 0.31  --  0.32 0.30  CREATININE  --  0.46 0.43* 0.45   Estimated Creatinine Clearance: 69.2 mL/min (by C-G formula based on SCr of 0.45 mg/dL).  Assessment: 23 yoF on warfarin and dofetilide PTA for afib. Admitted on 8/15 with abdominal pain and found to have bowel perforation now s/p colectomy and colostomy replacement. Transferred to the ICU postoperatively. Heparin started for anticoagulation in place of PTA warfarin.   Heparin level remains just within goal range at 0.30 this AM - has been borderline therapeutic for the past few days. Hgb 8.5, pltc WNL stable. No bleeding or infusion issues noted.    Goal of Therapy:  Heparin level 0.3-0.7 units/ml Monitor platelets by anticoagulation protocol: Yes   Plan:  Increase heparin slightly to 1400 units/hr Daily heparin level and CBC Monitor for s/sx  bleeding F/u ability to take PO and transition back to [place]. Gerarda Fraction, PharmD PGY2 Infectious Diseases Pharmacy Resident Phone: [phone] 08/02/2018    10:29 AM

## 2018-08-02 NOTE — Progress Notes (Signed)
PROGRESS NOTE    Melody Casey  KZS:[id number] DOB: [birth year] DOA: 07/24/2018 PCP: Dettinger, [name], MD  Outpatient Specialists:   Brief Narrative:  Patient is a 72 year old female with past medical history significant for diverticulitis, atrial fibrillation on warfarin and Tikosyn, coronary artery disease status post stenting, diabetes, breast cancer, and hypertension.  She presented to [facility] emergency department on 8/15 with complaints of recent constipation and more recent left lower quadrant pain.  This pain began to radiate to the periumbilical area which is what caused her to present.  CT scan of the abdomen in the emergency department demonstrated a focal perforation of the anterior sigmoid wall of the colon and pneumoperitoneum.  She was admitted for stabilization and reversal of warfarin prior to being taken to the operating room on 8/16 where she underwent colectomy with colostomy placement.  Operative course complicated by shock and acidosis.  Was started on pressors and was postoperatively transferred to the ICU for further monitoring.  07/30/2018: Hospitalist service assumed patient's care today.  Patient has no new complaints.  Patient is not a particularly good historian.  07/31/2018: Patient seen.  There are concerns for possible ileus.  QTc interval is back to normal.  Electrolytes have been monitored and corrected.  Surgical team to advise on rule of NG tube to low suction.  Patient is a diabetic, but cannot use Reglan due to concerns for QTc interval.  Keep potassium above 4.  Keep magnesium above 2.  Potential phosphorus is within normal range.  Overall, patient looks better today.  08/01/2018: Chest seems to be improving.  Gas is noted in colostomy bag.  Small amount of stool also noted.  Bowel sounds have returned, though, still hypoactive.  We will continue to optimize electrolytes.  Will repeat abdominal x-ray in the morning.  Overall, patient seems to be  improving.  08/02/2018: Patient continues to improve.  Stool is noted in the colostomy bag.  Patient is also feeling better.  Patient encouraged to engage more with physical therapy as this will help the ileus.  Hopefully, the patient be transferred to telemetry floor fairly soon.  We will continue to monitor electrolytes and replete accordingly.   Assessment & Plan:   Principal Problem:   Sepsis ([place]) Active Problems:   Type 2 diabetes mellitus with complication, without long-term current use of insulin (HCC)   CAD (coronary artery disease)   Anxiety and depression   AAA (abdominal aortic aneurysm) without rupture (HCC)   Microcytic anemia   Diverticulosis   Acute diverticulitis of intestine   Bowel perforation (HCC)   Hyponatremia   Diverticulitis with perforation, as status post left colectomy and end transverse colostomy: Sigmoid perforation likely secondary to stercoral ulcer found. Postop management as per surgical team. 08/01/2018: Mitchell Heir see above. 08/02/2018: This has resolved.  Continue postop management as per surgical team.  Likely transfer to telemetry floor very soon.   Possible ileus: Surgical team to advise on role of NG tube to low suction. Keep potassium above 4. Keep magnesium above 2. Optimize phosphorus level. Continue to monitor. 08/01/2018: Kindly see above. 08/02/2018: Ileus is slowly resolving.  Repeat abdominal x-ray done earlier today is noted.  Repeat abdominal x-ray in the morning.  Paroxysmal atrial fibrillation/coronary artery disease status post PCI: Stable for now. Patient is on Tikosyn. Cardizem added. Continue to monitor QTc interval. Prolonged QTc interval is improving with correction of abnormal electrolytes. 08/01/2018: The patient is back to normal sinus rhythm.  QTC is within normal  range. 08/02/2018: A. fib is rate controlled.  Patient is still on heparin.  Acute kidney injury: Resolved.  Hypokalemia: Continue to monitor and  replete. 08/01/2018: Potassium today 4.    Hypophosphatemia: Continue to monitor and replete. Phosphorus is 2.5 today. Continue to monitor and replace. Give IV potassium phosphate 10 mmol x 1 dose.  Hypertension/diabetes mellitus: Continue to optimize.  DVT prophylaxis: Heparin drip Code Status: Full code Family Communication:  Disposition Plan: Will depend on hospital course   Consultants:   Surgery  Cardiology  Critical care  Procedures:   Left colectomy and colostomy  Antimicrobials:   IV Zosyn   Subjective: Patient has continued to improve. Stool in the colostomy bag. Abdominal distention is slowly resolving. No fever or chills. No nausea or vomiting. No chest pain or shortness of breath.  Objective: Vitals:   08/02/18 1100 08/02/18 1200 08/02/18 1300 08/02/18 1400  BP:      Pulse: 90 87 84 92  Resp: 20 15 16 19   Temp:  98 F (36.7 C)    TempSrc:  Oral    SpO2: 96% 95% 96% 90%  Weight:      Height:        Intake/Output Summary (Last 24 hours) at 08/02/2018 1505 Last data filed at 08/02/2018 1400 Gross per 24 hour  Intake 851.33 ml  Output 905 ml  Net -53.67 ml   Filed Weights   07/24/18 0915 07/26/18 1311 07/29/18 0600  Weight: 72.6 kg 75.8 kg 81.7 kg    Examination:  General exam: Patient looks a lot better today.  Patient is awake, alert and oriented.  Patient is not in any distress.     Respiratory system: Clear to auscultation.  Cardiovascular system: S1 & S2 heard, Gastrointestinal system: Abdomen is less distended.  Bowel sounds are normoactive to slightly hyperactive.  Central nervous system: Alert and oriented. No focal neurological deficits. Extremities: Swelling of the left lower extremity is improving.  Right toes seem to be chronically contracted.    Data Reviewed: I have personally reviewed following labs and imaging studies  CBC: Recent Labs  Lab 07/27/18 0452 07/28/18 0409 07/29/18 0414 07/30/18 0427 07/31/18 0340  08/01/18 0200 08/02/18 0227  WBC 7.4 8.4 11.0* 9.5 9.0 9.4 9.6  NEUTROABS 6.5 7.1 9.1* 7.7  --   --   --   HGB 8.2* 8.6* 9.6* 10.0* 9.4* 8.9* 8.5*  HCT 24.4* 25.6* 29.2* 30.2* 28.8* 27.5* 26.9*  MCV 82.2 82.6 83.9 83.9 85.7 85.7 87.3  PLT 143* 164 200 192 204 216 793   Basic Metabolic Panel: Recent Labs  Lab 07/29/18 0414 07/30/18 0427 07/30/18 1514 07/31/18 0934 08/01/18 0200 08/02/18 0227  NA 138 135 134* 131* 130* 128*  K 3.7 3.0* 3.9 3.4* 3.8 4.0  CL 104 102 100 100 101 100  CO2 29 27 27 26 25 23   GLUCOSE 136* 150* 165* 119* 96 86  BUN 22 17 16 14 10 8   CREATININE 0.61 0.53 0.57 0.46 0.43* 0.45  CALCIUM 8.2* 7.9* 8.3* 8.4* 8.2* 8.3*  MG 1.6* 1.6*  --  1.6* 1.6* 1.7  PHOS 2.4* 2.2* 2.9 2.3*  2.4* 2.4* 2.5  2.5   GFR: Estimated Creatinine Clearance: 69.2 mL/min (by C-G formula based on SCr of 0.45 mg/dL). Liver Function Tests: Recent Labs  Lab 07/30/18 1514 07/31/18 0934 08/01/18 0200 08/02/18 0227  ALBUMIN 1.6* 1.6* 1.5* 1.5*   No results for input(s): LIPASE, AMYLASE in the last 168 hours. No results for input(s): AMMONIA  in the last 168 hours. Coagulation Profile: No results for input(s): INR, PROTIME in the last 168 hours. Cardiac Enzymes: Recent Labs  Lab 07/27/18 0452  TROPONINI 0.03*   BNP (last 3 results) No results for input(s): PROBNP in the last 8760 hours. HbA1C: No results for input(s): HGBA1C in the last 72 hours. CBG: Recent Labs  Lab 08/01/18 1922 08/01/18 2303 08/02/18 0305 08/02/18 0753 08/02/18 1220  GLUCAP 94 82 80 89 122*   Lipid Profile: No results for input(s): CHOL, HDL, LDLCALC, TRIG, CHOLHDL, LDLDIRECT in the last 72 hours. Thyroid Function Tests: No results for input(s): TSH, T4TOTAL, FREET4, T3FREE, THYROIDAB in the last 72 hours. Anemia Panel: No results for input(s): VITAMINB12, FOLATE, FERRITIN, TIBC, IRON, RETICCTPCT in the last 72 hours. Urine analysis:    Component Value Date/Time   COLORURINE YELLOW  09/01/2015 1800   APPEARANCEUR Cloudy (A) 06/30/2018 1345   LABSPEC 1.025 09/01/2015 1800   PHURINE 6.0 09/01/2015 1800   GLUCOSEU Negative 06/30/2018 1345   GLUCOSEU NEGATIVE 04/05/2008 0833   HGBUR SMALL (A) 09/01/2015 1800   BILIRUBINUR Negative 06/30/2018 1345   KETONESUR NEGATIVE 09/01/2015 1800   PROTEINUR Negative 06/30/2018 1345   PROTEINUR TRACE (A) 09/01/2015 1800   UROBILINOGEN negative 10/25/2015 0926   UROBILINOGEN 0.2 09/01/2015 1800   NITRITE Positive (A) 06/30/2018 1345   NITRITE NEGATIVE 09/01/2015 1800   LEUKOCYTESUR 1+ (A) 06/30/2018 1345   Sepsis Labs: @LABRCNTIP (procalcitonin:4,lacticidven:4)  ) Recent Results (from the past 240 hour(s))  Blood Culture (routine x 2)     Status: None   Collection Time: 07/24/18 11:07 AM  Result Value Ref Range Status   Specimen Description BLOOD LEFT ARM  Final   Special Requests   Final    BOTTLES DRAWN AEROBIC AND ANAEROBIC Blood Culture adequate volume   Culture   Final    NO GROWTH 5 DAYS Performed at [facility], [address], [place], [place] [address]    Report Status 07/29/2018 FINAL  Final  Blood Culture (routine x 2)     Status: None   Collection Time: 07/24/18 11:07 AM  Result Value Ref Range Status   Specimen Description BLOOD LEFT HAND  Final   Special Requests   Final    BOTTLES DRAWN AEROBIC AND ANAEROBIC Blood Culture adequate volume   Culture   Final    NO GROWTH 5 DAYS Performed at [facility], [address] [place], [place] [address]    Report Status 07/29/2018 FINAL  Final  Surgical pcr screen     Status: None   Collection Time: 07/24/18  8:19 PM  Result Value Ref Range Status   MRSA, PCR NEGATIVE NEGATIVE Final   Staphylococcus aureus NEGATIVE NEGATIVE Final    Comment: (NOTE) The Xpert SA Assay (FDA approved for NASAL specimens in patients 35 years of age and older), is one component of a comprehensive surveillance program. It is not intended to diagnose infection nor to guide or  monitor treatment. Performed at [facility], [place] [address], [place],  [address]   MRSA PCR Screening     Status: None   Collection Time: 07/25/18  1:15 PM  Result Value Ref Range Status   MRSA by PCR NEGATIVE NEGATIVE Final    Comment:        The GeneXpert MRSA Assay (FDA approved for NASAL specimens only), is one component of a comprehensive MRSA colonization surveillance program. It is not intended to diagnose MRSA infection nor to guide or monitor treatment  for MRSA infections. Performed at [facility], [place] [address] [place], [place] [address]          Radiology Studies: Dg Abd 1 View  Result Date: 08/02/2018 CLINICAL DATA:  Ileus EXAM: ABDOMEN - 1 VIEW COMPARISON:  07/31/2018 FINDINGS: Catheter again noted in the pelvis. Moderate amount of small bowel gas with dilatation. Ostomy in the left mid abdomen. Findings are consistent with either partial small-bowel obstruction or ileus. IMPRESSION: Similar pattern moderate small bowel gas and dilatation that could either be ileus or partial small bowel obstruction. Electronically Signed   By: Nelson Chimes M.D.   On: 08/02/2018 08:31        Scheduled Meds: . diltiazem  90 mg Oral Q6H  . dofetilide  250 mcg Oral BID  . famotidine  20 mg Oral BID  . insulin aspart  0-9 Units Subcutaneous Q4H  . mouth rinse  15 mL Mouth Rinse BID   Continuous Infusions: . sodium chloride 10 mL/hr at 08/02/18 0400  . heparin 1,400 Units/hr (08/02/18 1055)     LOS: 9 days    Time spent: 25 minutes.    Dana Allan, MD  [facility] Pager #: [phone] 7PM-7AM contact night coverage as above

## 2018-08-02 NOTE — Progress Notes (Addendum)
[facility] Progress Note  8 Days Post-Op  Subjective: CC:  Denies pain at rest, some pain with movement. C/o LLE swelling making mobilizing/transfers difficult. Having gas and some stool in colostomy. Reports significant swelling and fever with miralax. Prior to this hospitalization mobilized independently and lived in [place].  Objective: Vital signs in last 24 hours: Temp:  [97.7 F (36.5 C)-98.7 F (37.1 C)] 98.1 F (36.7 C) (08/24 0300) Pulse Rate:  [72-89] 81 (08/24 0600) Resp:  [14-21] 18 (08/24 0600) BP: (100-140)/(53-72) 117/57 (08/24 0400) SpO2:  [91 %-100 %] 91 % (08/24 0600) Last BM Date: 08/01/18  Intake/Output from previous day: 08/23 0701 - 08/24 0700 In: 1175.5 [I.V.:472.9; IV Piggyback:702.6] Out: 655 [Urine:625; Drains:30] Intake/Output this shift: No intake/output data recorded.  PE: Gen:  Alert, NAD, pleasant Card:  Regular rate and rhythm Pulm:  Normal effort, clear to auscultation bilaterally Abd: Soft, approp tender, present but hypoactive BS, colostomy with small amt stool and gas in pouch, stoma viable, RLQ drain with SS output Skin: warm and dry, no rashes  Psych: A&Ox3   Lab Results:  Recent Labs    08/01/18 0200 08/02/18 0227  WBC 9.4 9.6  HGB 8.9* 8.5*  HCT 27.5* 26.9*  PLT 216 252   BMET Recent Labs    08/01/18 0200 08/02/18 0227  NA 130* 128*  K 3.8 4.0  CL 101 100  CO2 25 23  GLUCOSE 96 86  BUN 10 8  CREATININE 0.43* 0.45  CALCIUM 8.2* 8.3*   PT/INR No results for input(s): LABPROT, INR in the last 72 hours. CMP     Component Value Date/Time   NA 128 (L) 08/02/2018 0227   NA 132 (L) 07/22/2018 1632   NA 134 (L) 06/06/2017 1259   K 4.0 08/02/2018 0227   K 4.5 06/06/2017 1259   CL 100 08/02/2018 0227   CO2 23 08/02/2018 0227   CO2 24 06/06/2017 1259   GLUCOSE 86 08/02/2018 0227   GLUCOSE 311 (H) 06/06/2017 1259   BUN 8 08/02/2018 0227   BUN 32 (H) 07/22/2018 1632   BUN 18.3 06/06/2017 1259   CREATININE 0.45 08/02/2018 0227   CREATININE 1.0 06/06/2017 1259   CALCIUM 8.3 (L) 08/02/2018 0227   CALCIUM 10.6 (H) 06/06/2017 1259   PROT 4.7 (L) 07/26/2018 0311   PROT 6.5 07/22/2018 1632   PROT 7.7 06/06/2017 1259   ALBUMIN 1.5 (L) 08/02/2018 0227   ALBUMIN 3.5 07/22/2018 1632   ALBUMIN 3.3 (L) 06/06/2017 1259   AST 26 07/26/2018 0311   AST 17 06/06/2017 1259   ALT 15 07/26/2018 0311   ALT 14 06/06/2017 1259   ALKPHOS 66 07/26/2018 0311   ALKPHOS 115 06/06/2017 1259   BILITOT 3.1 (H) 07/26/2018 0311   BILITOT 0.9 07/22/2018 1632   BILITOT 0.52 06/06/2017 1259   GFRNONAA >60 08/02/2018 0227   GFRNONAA 80 06/22/2015 1614   GFRAA >60 08/02/2018 0227   GFRAA >89 06/22/2015 1614   Lipase     Component Value Date/Time   LIPASE 24 07/24/2018 0944       Studies/Results: Dg Abd Portable 1v  Result Date: 07/31/2018 CLINICAL DATA:  Abdominal pain EXAM: PORTABLE ABDOMEN - 1 VIEW COMPARISON:  07/25/2018 FINDINGS: Gastric catheter has been removed in the interval. The stomach is distended with air. Scattered large and small bowel gas is noted with slight prominence of the small bowel in the left abdomen. Ostomy is noted in the mid left abdomen as well. Degenerative changes of  lumbar spine are seen. Surgical drain is noted inferiorly. IMPRESSION: Mild prominence of the small bowel in the left mid abdomen which may represent a mild postoperative ileus. Surgical drain in satisfactory position. Electronically Signed   By: Inez Catalina M.D.   On: 07/31/2018 08:48    Anti-infectives: Anti-infectives (From admission, onward)   Start     Dose/Rate Route Frequency Ordered Stop   07/24/18 1800  piperacillin-tazobactam (ZOSYN) IVPB 3.375 g     3.375 g 12.5 mL/hr over 240 Minutes Intravenous Every 8 hours 07/24/18 1725 07/31/18 1900   07/24/18 1045  cefTRIAXone (ROCEPHIN) 2 g in sodium chloride 0.9 % 100 mL IVPB  Status:  Discontinued     2 g 200 mL/hr over 30 Minutes Intravenous Every 24  hours 07/24/18 1044 07/24/18 1722   07/24/18 1045  metroNIDAZOLE (FLAGYL) IVPB 500 mg  Status:  Discontinued     500 mg 100 mL/hr over 60 Minutes Intravenous Every 8 hours 07/24/18 1044 07/24/18 1722     Assessment/Plan Principal Problem: Sepsis ([place]) Active Problems: Type 2 diabetes mellitus with complication, without long-term current use of insulin (HCC) CAD (coronary artery disease) Anxiety and depression AAA (abdominal aortic aneurysm) without rupture (HCC) Microcytic anemia Diverticulosis Acute diverticulitis of intestine Bowel perforation (HCC) Hyponatremia  Bowel perforation POD#8 S/P Hartman's (left colectomy, end transverse colostomy), Dr. Donne Hazel, 08/16 - bowel function returned - start clear liquid diet  - continue RLQ drain, 30 cc/24h, will likely DC soon - PO pain control - PT/OT  FEN:CLD  VTE: SCD's, lovenox BV:APOLI 08/15-08/21 Follow up:Dr. Donne Hazel  Plan: advance diet, WOC RN consult, continue therapies, stable for transfer to SDU   LOS: 9 days    Obie Dredge, [facility] Pager: [phone]

## 2018-08-03 ENCOUNTER — Inpatient Hospital Stay (HOSPITAL_COMMUNITY): Payer: Medicare Other

## 2018-08-03 LAB — PHOSPHORUS: Phosphorus: 2.5 mg/dL (ref 2.5–4.6)

## 2018-08-03 LAB — RENAL FUNCTION PANEL
Albumin: 1.5 g/dL — ABNORMAL LOW (ref 3.5–5.0)
Anion gap: 7 (ref 5–15)
BUN: 6 mg/dL — ABNORMAL LOW (ref 8–23)
CO2: 20 mmol/L — ABNORMAL LOW (ref 22–32)
Calcium: 8.1 mg/dL — ABNORMAL LOW (ref 8.9–10.3)
Chloride: 100 mmol/L (ref 98–111)
Creatinine, Ser: 0.42 mg/dL — ABNORMAL LOW (ref 0.44–1.00)
GFR calc Af Amer: >60 mL/min (ref >60)
GFR calc non Af Amer: >60 mL/min (ref >60)
Glucose, Bld: 93 mg/dL (ref 70–99)
Phosphorus: 2.4 mg/dL — ABNORMAL LOW (ref 2.5–4.6)
Potassium: 3.9 mmol/L (ref 3.5–5.1)
Sodium: 127 mmol/L — ABNORMAL LOW (ref 135–145)

## 2018-08-03 LAB — CBC
HEMATOCRIT: 27.1 % — AB (ref 36.0–46.0)
Hemoglobin: 8.8 g/dL — ABNORMAL LOW (ref 12.0–15.0)
MCH: 27.8 pg (ref 26.0–34.0)
MCHC: 32.5 g/dL (ref 30.0–36.0)
MCV: 85.8 fL (ref 78.0–100.0)
Platelets: 251 10*3/uL (ref 150–400)
RBC: 3.16 MIL/uL — ABNORMAL LOW (ref 3.87–5.11)
RDW: 18.1 % — ABNORMAL HIGH (ref 11.5–15.5)
WBC: 8.2 10*3/uL (ref 4.0–10.5)

## 2018-08-03 LAB — GLUCOSE, CAPILLARY
GLUCOSE-CAPILLARY: 101 mg/dL — AB (ref 70–99)
GLUCOSE-CAPILLARY: 136 mg/dL — AB (ref 70–99)
Glucose-Capillary: 88 mg/dL (ref 70–99)
Glucose-Capillary: 96 mg/dL (ref 70–99)
Glucose-Capillary: 87 mg/dL (ref 70–99)
Glucose-Capillary: 125 mg/dL — ABNORMAL HIGH (ref 70–99)

## 2018-08-03 LAB — MAGNESIUM
Magnesium: 1.6 mg/dL — ABNORMAL LOW (ref 1.7–2.4)
Magnesium: 2.2 mg/dL (ref 1.7–2.4)

## 2018-08-03 LAB — HEPARIN LEVEL (UNFRACTIONATED): Heparin Unfractionated: 0.26 IU/mL — ABNORMAL LOW (ref 0.30–0.70)

## 2018-08-03 MED ORDER — POTASSIUM CHLORIDE CRYS ER 20 MEQ PO TBCR
20.0000 meq | EXTENDED_RELEASE_TABLET | Freq: Once | ORAL | Status: AC
  Administered 2018-08-03: 20 meq via ORAL
  Filled 2018-08-03: qty 1

## 2018-08-03 MED ORDER — ENSURE ENLIVE PO LIQD
237.0000 mL | Freq: Two times a day (BID) | ORAL | Status: DC
  Administered 2018-08-03 – 2018-08-06 (×4): 237 mL via ORAL

## 2018-08-03 MED ORDER — SODIUM CHLORIDE 0.9 % IV SOLN
INTRAVENOUS | Status: AC
  Administered 2018-08-03: 11:00:00 via INTRAVENOUS

## 2018-08-03 MED ORDER — HYDROXYCHLOROQUINE SULFATE 200 MG PO TABS
200.0000 mg | ORAL_TABLET | Freq: Two times a day (BID) | ORAL | Status: DC
  Administered 2018-08-03 – 2018-08-06 (×7): 200 mg via ORAL
  Filled 2018-08-03 (×8): qty 1

## 2018-08-03 MED ORDER — ATORVASTATIN CALCIUM 40 MG PO TABS
40.0000 mg | ORAL_TABLET | Freq: Every day | ORAL | Status: DC
  Administered 2018-08-03 – 2018-08-05 (×3): 40 mg via ORAL
  Filled 2018-08-03 (×2): qty 1

## 2018-08-03 MED ORDER — AMLODIPINE BESYLATE 2.5 MG PO TABS
2.5000 mg | ORAL_TABLET | Freq: Every day | ORAL | Status: DC
  Administered 2018-08-03 – 2018-08-06 (×4): 2.5 mg via ORAL
  Filled 2018-08-03 (×4): qty 1

## 2018-08-03 MED ORDER — MAGNESIUM SULFATE 2 GM/50ML IV SOLN
2.0000 g | Freq: Once | INTRAVENOUS | Status: AC
  Administered 2018-08-03: 2 g via INTRAVENOUS
  Filled 2018-08-03: qty 50

## 2018-08-03 NOTE — Progress Notes (Signed)
SLP has been following for swallowing treatment. Orders d/c this morning. If continued SLP f/u is wanted, please reorder.   Thanks! Germain Osgood, M.A. CCC-SLP [phone]

## 2018-08-03 NOTE — Progress Notes (Addendum)
PROGRESS NOTE    Melody Casey  QPR:[id number] DOB: May 23, 1946 DOA: 07/24/2018 PCP: Dettinger, [name], MD    Brief Narrative:  72 year old with past medical history relevant for recurrent diverticulitis, anxiety/depression, intracranial abdominal aortic aneurysm, chronic atrial fibrillation on warfarin and dofetilide, history of nephrolithiasis complicated by right hydronephrosis and stent placement status post stent removal, breast cancer in remission on anastrozole, coronary artery disease status post DES to OM 2 in 2012, hypertension, type 2 diabetes on oral hypoglycemics, hyperlipidemia questionable history of scleroderma as a child who was admitted on 07/24/2018 to any pen with abdominal pain and found to have fecal peritonitis due to perforated diverticulum status post left colectomy, end 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  Her postoperative course has been complicated by episodes of hypotension and A. fib with RVR.      Assessment & Plan:   Principal Problem:   Sepsis ([place]) Active Problems:   Type 2 diabetes mellitus with complication, without long-term current use of insulin (HCC)   CAD (coronary artery disease)   Anxiety and depression   AAA (abdominal aortic aneurysm) without rupture (HCC)   Microcytic anemia   Diverticulosis   Acute diverticulitis of intestine   Bowel perforation (HCC)   Hyponatremia   #) Pan diverticulitis, gated by perforated sigmoid colon and fecal peritonitis status post left hemicolectomy and transverse colostomy placement on 07/25/2018: Patient is currently doing well with return of some bowel function.  She is making some stool output and does have good bowel sounds but continues to have early satiety. - Status post Zosyn ended on 07/30/2018 -Clear liquid diet, advance as tolerated -Pain control as tolerated -Pending discharge likely to [facility] inpatient rehab once complete return of bowel function -Colostomy teaching needed prior to discharge -Right  lower quadrant JP drain discontinued today -We will give 1 day of gentle IV fluids  #) Chronic atrial fibrillation with RVR: This appears to be better controlled now. - Continue fractionate diltiazem 80 mg every 6, consolidate tomorrow -Continue dofetilide 250 mcg twice daily, closely monitor QTC -On heparin drip, will start warfarin likely tomorrow  #) Hypertension/hyperlipidemia/coronary artery disease status post stent: - Calcium channel blocker per above -Continue amlodipine 2.5 mg daily -Continue atorvastatin 40 mg daily -Hold home lisinopril 40 mg daily, will restart as blood pressure tolerates  #) Type 2 diabetes: -Hold home metformin 500 mg daily -Sliding scale insulin, AC at bedtime  #) Breast cancer in remission: -Hold home anastrozole 1 mg daily  #) Questionable history of scleroderma: -Continue hydroxychloroquine 200 mg twice daily  Fluids: Gentle IV fluids Elect lites: Monitor and supplement Nutrition: Clear liquid diet, advance as tolerated  Prophylaxis: Heparin drip  Disposition: Pending [facility] inpatient rehab after return of bowel function  Full code    Consultants:   PCCM  General surgery  [facility] inpatient rehab  Procedures:  ETT 8/16 >8/18 Art line 8/16>8/18 RIJ CVL 8/16 > 8/23  07/25/2018: Left hemicolectomy and transverse colostomy  Antimicrobials:   IV Zosyn 07/24/2018 to 07/30/2018   Subjective: Patient reports she is doing fairly well.  She does report some nausea when taking her tramadol for pain but otherwise denies any cough, congestion, rhinorrhea.  She did tolerate her breakfast but continues to have early satiety.  Objective: Vitals:   08/02/18 1946 08/02/18 2359 08/03/18 0300 08/03/18 0725  BP: 135/63 133/65 129/63 (!) 122/93  Pulse: 92 94 95 91  Resp: 18 20 15 15   Temp: 97.7 F (36.5 C) (!) 97.3 F (36.3 C) 97.8  F (36.6 C) (!) 97.5 F (36.4 C)  TempSrc: Oral Oral Oral Oral  SpO2: 92% 94% 95% 96%  Weight:      Height:         Intake/Output Summary (Last 24 hours) at 08/03/2018 0936 Last data filed at 08/03/2018 0700 Gross per 24 hour  Intake 649.68 ml  Output 400 ml  Net 249.68 ml   Filed Weights   07/24/18 0915 07/26/18 1311 07/29/18 0600  Weight: 72.6 kg 75.8 kg 81.7 kg    Examination:  General exam: Appears calm and comfortable  Respiratory system: No increased work of breathing, anterior lung sounds clear, no wheezes, crackles, rhonchi. Cardiovascular system: Distant heart sounds, irregularly irregular, no murmurs Gastrointestinal system: Soft, nondistended, mildly tender to palpation over incision, plus bowel sounds in all 4 quadrants. Central nervous system: Alert and oriented. No focal neurological deficits. Extremities: No lower extremity edema, atrophic and contractured right lower extremity Skin: Incision site is clean dry and intact Psychiatry: Judgement and insight appear normal. Mood & affect appropriate.     Data Reviewed: I have personally reviewed following labs and imaging studies  CBC: Recent Labs  Lab 07/28/18 0409 07/29/18 0414 07/30/18 0427 07/31/18 0340 08/01/18 0200 08/02/18 0227 08/03/18 0353  WBC 8.4 11.0* 9.5 9.0 9.4 9.6 8.2  NEUTROABS 7.1 9.1* 7.7  --   --   --   --   HGB 8.6* 9.6* 10.0* 9.4* 8.9* 8.5* 8.8*  HCT 25.6* 29.2* 30.2* 28.8* 27.5* 26.9* 27.1*  MCV 82.6 83.9 83.9 85.7 85.7 87.3 85.8  PLT 164 200 192 204 216 252 704   Basic Metabolic Panel: Recent Labs  Lab 07/30/18 0427 07/30/18 1514 07/31/18 0934 08/01/18 0200 08/02/18 0227 08/03/18 0353  NA 135 134* 131* 130* 128* 127*  K 3.0* 3.9 3.4* 3.8 4.0 3.9  CL 102 100 100 101 100 100  CO2 27 27 26 25 23  20*  GLUCOSE 150* 165* 119* 96 86 93  BUN 17 16 14 10 8  6*  CREATININE 0.53 0.57 0.46 0.43* 0.45 0.42*  CALCIUM 7.9* 8.3* 8.4* 8.2* 8.3* 8.1*  MG 1.6*  --  1.6* 1.6* 1.7 1.6*  PHOS 2.2* 2.9 2.3*  2.4* 2.4* 2.5  2.5 2.5  2.4*   GFR: Estimated Creatinine Clearance: 69.2 mL/min (A) (by C-G  formula based on SCr of 0.42 mg/dL (L)). Liver Function Tests: Recent Labs  Lab 07/30/18 1514 07/31/18 0934 08/01/18 0200 08/02/18 0227 08/03/18 0353  ALBUMIN 1.6* 1.6* 1.5* 1.5* 1.5*   No results for input(s): LIPASE, AMYLASE in the last 168 hours. No results for input(s): AMMONIA in the last 168 hours. Coagulation Profile: No results for input(s): INR, PROTIME in the last 168 hours. Cardiac Enzymes: No results for input(s): CKTOTAL, CKMB, CKMBINDEX, TROPONINI in the last 168 hours. BNP (last 3 results) No results for input(s): PROBNP in the last 8760 hours. HbA1C: No results for input(s): HGBA1C in the last 72 hours. CBG: Recent Labs  Lab 08/02/18 1220 08/02/18 2034 08/02/18 2353 08/03/18 0403 08/03/18 0823  GLUCAP 122* 87 88 96 87   Lipid Profile: No results for input(s): CHOL, HDL, LDLCALC, TRIG, CHOLHDL, LDLDIRECT in the last 72 hours. Thyroid Function Tests: No results for input(s): TSH, T4TOTAL, FREET4, T3FREE, THYROIDAB in the last 72 hours. Anemia Panel: No results for input(s): VITAMINB12, FOLATE, FERRITIN, TIBC, IRON, RETICCTPCT in the last 72 hours. Sepsis Labs: No results for input(s): PROCALCITON, LATICACIDVEN in the last 168 hours.  Recent Results (from the past 240 hour(s))  Blood Culture (routine x 2)     Status: None   Collection Time: 07/24/18 11:07 AM  Result Value Ref Range Status   Specimen Description BLOOD LEFT ARM  Final   Special Requests   Final    BOTTLES DRAWN AEROBIC AND ANAEROBIC Blood Culture adequate volume   Culture   Final    NO GROWTH 5 DAYS Performed at [facility], [address], [place], [place] [address]    Report Status 07/29/2018 FINAL  Final  Blood Culture (routine x 2)     Status: None   Collection Time: 07/24/18 11:07 AM  Result Value Ref Range Status   Specimen Description BLOOD LEFT HAND  Final   Special Requests   Final    BOTTLES DRAWN AEROBIC AND ANAEROBIC Blood Culture adequate volume   Culture   Final     NO GROWTH 5 DAYS Performed at [facility], [address], [place], [place] [address]    Report Status 07/29/2018 FINAL  Final  Surgical pcr screen     Status: None   Collection Time: 07/24/18  8:19 PM  Result Value Ref Range Status   MRSA, PCR NEGATIVE NEGATIVE Final   Staphylococcus aureus NEGATIVE NEGATIVE Final    Comment: (NOTE) The Xpert SA Assay (FDA approved for NASAL specimens in patients 44 years of age and older), is one component of a comprehensive surveillance program. It is not intended to diagnose infection nor to guide or monitor treatment. Performed at [facility], [place] [address] [place], [place] [address]   MRSA PCR Screening     Status: None   Collection Time: 07/25/18  1:15 PM  Result Value Ref Range Status   MRSA by PCR NEGATIVE NEGATIVE Final    Comment:        The GeneXpert MRSA Assay (FDA approved for NASAL specimens only), is one component of a comprehensive MRSA colonization surveillance program. It is not intended to diagnose MRSA infection nor to guide or monitor treatment for MRSA infections. Performed at [facility], [place] [address] [place],  [address]          Radiology Studies: Dg Abd 1 View  Result Date: 08/03/2018 CLINICAL DATA:  Ileus, recent colectomy EXAM: ABDOMEN - 1 VIEW COMPARISON:  08/02/2018 abdominal radiograph FINDINGS: Surgical drain terminates in the left pelvis. Surgical clip overlies the paraspinal right abdomen. Coarsely calcified uterine fibroids are seen overlying the sacrum. Mildly dilated small bowel loops in the left abdomen are not appreciably changed. No evidence of pneumatosis or pneumoperitoneum. No radiopaque nephrolithiasis. IMPRESSION: Stable mildly dilated small bowel loops in the left abdomen compatible with adynamic ileus in the postoperative state. Electronically Signed   By: Ilona Sorrel M.D.   On: 08/03/2018 07:42   Dg Abd 1 View  Result Date: 08/02/2018 CLINICAL DATA:  Ileus EXAM:  ABDOMEN - 1 VIEW COMPARISON:  07/31/2018 FINDINGS: Catheter again noted in the pelvis. Moderate amount of small bowel gas with dilatation. Ostomy in the left mid abdomen. Findings are consistent with either partial small-bowel obstruction or ileus. IMPRESSION: Similar pattern moderate small bowel gas and dilatation that could either be ileus or partial small bowel obstruction. Electronically Signed   By: Nelson Chimes M.D.   On: 08/02/2018 08:31        Scheduled Meds: . amLODipine  2.5 mg Oral Daily  . atorvastatin  40 mg Oral q1800  . diltiazem  90 mg Oral Q6H  . dofetilide  250 mcg Oral  BID  . famotidine  20 mg Oral BID  . feeding supplement (ENSURE ENLIVE)  237 mL Oral BID BM  . hydroxychloroquine  200 mg Oral BID  . insulin aspart  0-9 Units Subcutaneous Q4H  . mouth rinse  15 mL Mouth Rinse BID   Continuous Infusions: . sodium chloride 10 mL/hr at 08/02/18 0400  . sodium chloride    . heparin 1,500 Units/hr (08/03/18 0700)  . magnesium sulfate 1 - 4 g bolus IVPB 2 g (08/03/18 0922)     LOS: 10 days    Time spent: [place], MD [facility]  If 7PM-7AM, please contact night-coverage [email or web address] Password [facility] 08/03/2018, 9:36 AM

## 2018-08-03 NOTE — Progress Notes (Signed)
Pt tolerated 2.5 hours in chair. 32mcg Fentanyl given pre and post transfer. Maximum 2 person assist with pivot. Slight improvement in appetite, minimal stool in colostomy.

## 2018-08-03 NOTE — Progress Notes (Addendum)
[facility] Progress Note  9 Days Post-Op  Subjective: CC:  Pain controlled with tramadol, did take fentanyl at bedtime. Endorses early satiety and poor PO intake yesterday but denies N/V. RN reports burping colostomy bag q 4h. Having small amout stool output.   Objective: Vital signs in last 24 hours: Temp:  [97.3 F (36.3 C)-98.3 F (36.8 C)] 97.5 F (36.4 C) (08/25 0725) Pulse Rate:  [84-95] 91 (08/25 0725) Resp:  [15-21] 15 (08/25 0725) BP: (122-135)/(63-93) 122/93 (08/25 0725) SpO2:  [90 %-96 %] 96 % (08/25 0725) Last BM Date: 08/02/18  Intake/Output from previous day: 08/24 0701 - 08/25 0700 In: 778.6 [P.O.:60; I.V.:383.2; IV Piggyback:335.4] Out: 450 [Urine:450] Intake/Output this shift: No intake/output data recorded.  PE: Gen:  Alert, NAD, pleasant Pulm:  Normal effort Abd: Soft, approp tender, good BS all 4 quadrants, colostomy with small amt stool and gas in pouch, stoma viable, RLQ drain with SS output Skin: warm and dry, no rashes  Psych: A&Ox3   Lab Results:  Recent Labs    08/02/18 0227 08/03/18 0353  WBC 9.6 8.2  HGB 8.5* 8.8*  HCT 26.9* 27.1*  PLT 252 251   BMET Recent Labs    08/02/18 0227 08/03/18 0353  NA 128* 127*  K 4.0 3.9  CL 100 100  CO2 23 20*  GLUCOSE 86 93  BUN 8 6*  CREATININE 0.45 0.42*  CALCIUM 8.3* 8.1*   PT/INR No results for input(s): LABPROT, INR in the last 72 hours. CMP     Component Value Date/Time   NA 127 (L) 08/03/2018 0353   NA 132 (L) 07/22/2018 1632   NA 134 (L) 06/06/2017 1259   K 3.9 08/03/2018 0353   K 4.5 06/06/2017 1259   CL 100 08/03/2018 0353   CO2 20 (L) 08/03/2018 0353   CO2 24 06/06/2017 1259   GLUCOSE 93 08/03/2018 0353   GLUCOSE 311 (H) 06/06/2017 1259   BUN 6 (L) 08/03/2018 0353   BUN 32 (H) 07/22/2018 1632   BUN 18.3 06/06/2017 1259   CREATININE 0.42 (L) 08/03/2018 0353   CREATININE 1.0 06/06/2017 1259   CALCIUM 8.1 (L) 08/03/2018 0353   CALCIUM 10.6 (H) 06/06/2017  1259   PROT 4.7 (L) 07/26/2018 0311   PROT 6.5 07/22/2018 1632   PROT 7.7 06/06/2017 1259   ALBUMIN 1.5 (L) 08/03/2018 0353   ALBUMIN 3.5 07/22/2018 1632   ALBUMIN 3.3 (L) 06/06/2017 1259   AST 26 07/26/2018 0311   AST 17 06/06/2017 1259   ALT 15 07/26/2018 0311   ALT 14 06/06/2017 1259   ALKPHOS 66 07/26/2018 0311   ALKPHOS 115 06/06/2017 1259   BILITOT 3.1 (H) 07/26/2018 0311   BILITOT 0.9 07/22/2018 1632   BILITOT 0.52 06/06/2017 1259   GFRNONAA >60 08/03/2018 0353   GFRNONAA 80 06/22/2015 1614   GFRAA >60 08/03/2018 0353   GFRAA >89 06/22/2015 1614   Lipase     Component Value Date/Time   LIPASE 24 07/24/2018 0944       Studies/Results: Dg Abd 1 View  Result Date: 08/03/2018 CLINICAL DATA:  Ileus, recent colectomy EXAM: ABDOMEN - 1 VIEW COMPARISON:  08/02/2018 abdominal radiograph FINDINGS: Surgical drain terminates in the left pelvis. Surgical clip overlies the paraspinal right abdomen. Coarsely calcified uterine fibroids are seen overlying the sacrum. Mildly dilated small bowel loops in the left abdomen are not appreciably changed. No evidence of pneumatosis or pneumoperitoneum. No radiopaque nephrolithiasis. IMPRESSION: Stable mildly dilated small bowel loops in the  left abdomen compatible with adynamic ileus in the postoperative state. Electronically Signed   By: Ilona Sorrel M.D.   On: 08/03/2018 07:42   Dg Abd 1 View  Result Date: 08/02/2018 CLINICAL DATA:  Ileus EXAM: ABDOMEN - 1 VIEW COMPARISON:  07/31/2018 FINDINGS: Catheter again noted in the pelvis. Moderate amount of small bowel gas with dilatation. Ostomy in the left mid abdomen. Findings are consistent with either partial small-bowel obstruction or ileus. IMPRESSION: Similar pattern moderate small bowel gas and dilatation that could either be ileus or partial small bowel obstruction. Electronically Signed   By: Nelson Chimes M.D.   On: 08/02/2018 08:31    Anti-infectives: Anti-infectives (From admission,  onward)   Start     Dose/Rate Route Frequency Ordered Stop   08/03/18 1000  hydroxychloroquine (PLAQUENIL) tablet 200 mg     200 mg Oral 2 times daily 08/03/18 0727     07/24/18 1800  piperacillin-tazobactam (ZOSYN) IVPB 3.375 g     3.375 g 12.5 mL/hr over 240 Minutes Intravenous Every 8 hours 07/24/18 1725 07/31/18 1900   07/24/18 1045  cefTRIAXone (ROCEPHIN) 2 g in sodium chloride 0.9 % 100 mL IVPB  Status:  Discontinued     2 g 200 mL/hr over 30 Minutes Intravenous Every 24 hours 07/24/18 1044 07/24/18 1722   07/24/18 1045  metroNIDAZOLE (FLAGYL) IVPB 500 mg  Status:  Discontinued     500 mg 100 mL/hr over 60 Minutes Intravenous Every 8 hours 07/24/18 1044 07/24/18 1722     Assessment/Plan Principal Problem: Sepsis ([place]) Active Problems: Type 2 diabetes mellitus with complication, without long-term current use of insulin (HCC) CAD (coronary artery disease) Anxiety and depression AAA (abdominal aortic aneurysm) without rupture (HCC) Microcytic anemia Diverticulosis Acute diverticulitis of intestine Bowel perforation (HCC) Hyponatremia  Bowel perforation POD#9 S/P Hartman's (left colectomy, end transverse colostomy), Dr. Donne Hazel, 08/16 - bowel function returned - tolerating clears, add ensure and advance to fulls; discussed 5-6 small meals rather than 3 large.  - D/C RLQ JP drain, SS, minimal output, ~10 cc/24h - PO pain control - PT/OT   FEN:FLD, ensure, Mg 1.6 - primary team replacing, hyponatremia 127 VTE: SCD's, lovenox FV:CBSWH 08/15-08/21 Follow up:Dr. Donne Hazel  Plan: advance diet, D/C JP, mobilize, therapies recommending CIR    LOS: 10 days    Obie Dredge, [facility] Surgery Pager: [phone]

## 2018-08-03 NOTE — Progress Notes (Signed)
[place] for Heparin Indication: atrial fibrillation  Allergies  Allergen Reactions  . Miralax [Polyethylene Glycol] Swelling and Rash    Took [facility] brand developed rash. Patient states she tolerated name brand MiraLax in the past.  09/01/15. Patient states she had diffuse swelling including swelling of her lips.   . Vancomycin Rash and Shortness Of Breath  . Acetaminophen Hives  . Oxycodone-Acetaminophen Itching  . Sulfa Antibiotics Rash    All over rash  . Sulfacetamide Sodium Rash    All over rash  . Sulfasalazine Rash    All over rash All over rash  . Banana Other (See Comments)    Unknown  . Latex Rash  . Tape Rash   Patient Measurements: Height: 5\' 8"  (172.7 cm) Weight: 180 lb 1.9 oz (81.7 kg) IBW/kg (Calculated) : 63.9 Heparin Dosing Weight: 76 kg  Vital Signs: Temp: 97.8 F (36.6 C) (08/25 0300) Temp Source: Oral (08/25 0300) BP: 129/63 (08/25 0300) Pulse Rate: 95 (08/25 0300)  Labs: Recent Labs    07/31/18 0934  08/01/18 0200 08/02/18 0227 08/03/18 0353  HGB  --    < > 8.9* 8.5* 8.8*  HCT  --   --  27.5* 26.9* 27.1*  PLT  --   --  216 252 251  HEPARINUNFRC  --   --  0.32 0.30 0.26*  CREATININE 0.46  --  0.43* 0.45  --    < > = values in this interval not displayed.   Estimated Creatinine Clearance: 69.2 mL/min (by C-G formula based on SCr of 0.45 mg/dL).  Assessment: 17 yoF on warfarin and dofetilide PTA for afib. Admitted on 8/15 with abdominal pain and found to have bowel perforation now s/p colectomy and colostomy replacement. Transferred to the ICU postoperatively. Heparin started for anticoagulation in place of PTA warfarin.   Heparin level 0.26 units/ml.  No issues noted with infusion overnight. Goal of Therapy:  Heparin level 0.3-0.7 units/ml Monitor platelets by anticoagulation protocol: Yes   Plan:  Increase heparin slightly to 1500 units/hr Daily heparin level and CBC Monitor for s/sx  bleeding F/u ability to take PO and transition back to [place], PharmD

## 2018-08-04 LAB — CBC
HCT: 26.7 % — ABNORMAL LOW (ref 36.0–46.0)
Hemoglobin: 9.1 g/dL — ABNORMAL LOW (ref 12.0–15.0)
MCH: 29.4 pg (ref 26.0–34.0)
MCHC: 34.1 g/dL (ref 30.0–36.0)
MCV: 86.1 fL (ref 78.0–100.0)
Platelets: 401 10*3/uL — ABNORMAL HIGH (ref 150–400)
RBC: 3.1 MIL/uL — ABNORMAL LOW (ref 3.87–5.11)
RDW: 18.5 % — ABNORMAL HIGH (ref 11.5–15.5)
WBC: 7.8 10*3/uL (ref 4.0–10.5)

## 2018-08-04 LAB — BASIC METABOLIC PANEL
Anion gap: 6 (ref 5–15)
Calcium: 8.3 mg/dL — ABNORMAL LOW (ref 8.9–10.3)
Creatinine, Ser: 0.48 mg/dL (ref 0.44–1.00)
GFR calc non Af Amer: >60 mL/min (ref >60)
Glucose, Bld: 103 mg/dL — ABNORMAL HIGH (ref 70–99)

## 2018-08-04 LAB — BASIC METABOLIC PANEL WITH GFR
BUN: 7 mg/dL — ABNORMAL LOW (ref 8–23)
CO2: 22 mmol/L (ref 22–32)
Chloride: 98 mmol/L (ref 98–111)
GFR calc Af Amer: >60 mL/min (ref >60)
Potassium: 4.1 mmol/L (ref 3.5–5.1)
Sodium: 126 mmol/L — ABNORMAL LOW (ref 135–145)

## 2018-08-04 LAB — GLUCOSE, CAPILLARY
GLUCOSE-CAPILLARY: 118 mg/dL — AB (ref 70–99)
GLUCOSE-CAPILLARY: 118 mg/dL — AB (ref 70–99)
GLUCOSE-CAPILLARY: 99 mg/dL (ref 70–99)
Glucose-Capillary: 94 mg/dL (ref 70–99)
Glucose-Capillary: 101 mg/dL — ABNORMAL HIGH (ref 70–99)
Glucose-Capillary: 118 mg/dL — ABNORMAL HIGH (ref 70–99)
Glucose-Capillary: 121 mg/dL — ABNORMAL HIGH (ref 70–99)

## 2018-08-04 LAB — MAGNESIUM: Magnesium: 1.7 mg/dL (ref 1.7–2.4)

## 2018-08-04 LAB — HEPARIN LEVEL (UNFRACTIONATED): Heparin Unfractionated: 0.29 [IU]/mL — ABNORMAL LOW (ref 0.30–0.70)

## 2018-08-04 MED ORDER — MAGNESIUM SULFATE 4 GM/100ML IV SOLN
4.0000 g | Freq: Once | INTRAVENOUS | Status: AC
  Administered 2018-08-04: 4 g via INTRAVENOUS
  Filled 2018-08-04: qty 100

## 2018-08-04 MED ORDER — INSULIN ASPART 100 UNIT/ML ~~LOC~~ SOLN: 0.0000 [IU] | Freq: Three times a day (TID) | SUBCUTANEOUS | Status: DC

## 2018-08-04 MED ORDER — DILTIAZEM HCL ER COATED BEADS 180 MG PO CP24
360.0000 mg | ORAL_CAPSULE | Freq: Every day | ORAL | Status: DC
  Administered 2018-08-04 – 2018-08-06 (×3): 360 mg via ORAL
  Filled 2018-08-04 (×3): qty 2

## 2018-08-04 MED ORDER — WARFARIN - PHARMACIST DOSING INPATIENT
Freq: Every day | Status: DC
  Administered 2018-08-04 – 2018-08-05 (×2)

## 2018-08-04 MED ORDER — WARFARIN SODIUM 5 MG PO TABS
5.0000 mg | ORAL_TABLET | Freq: Once | ORAL | Status: AC
  Administered 2018-08-04: 5 mg via ORAL
  Filled 2018-08-04: qty 1

## 2018-08-04 MED ORDER — HEPARIN (PORCINE) IN NACL 100-0.45 UNIT/ML-% IJ SOLN
1550.0000 [IU]/h | INTRAMUSCULAR | Status: DC
  Administered 2018-08-04 – 2018-08-06 (×4): 1500 [IU]/h via INTRAVENOUS
  Filled 2018-08-04 (×3): qty 250

## 2018-08-04 NOTE — Progress Notes (Signed)
[facility] Progress Note  10 Days Post-Op  Subjective: CC:  Pain improving daily. Tolerating PO. Denies nausea or vomiting. Feels distention slightly improved. Having gas and small amt stool in colostomy pouch.   Objective: Vital signs in last 24 hours: Temp:  [97.5 F (36.4 C)-98.4 F (36.9 C)] 97.6 F (36.4 C) (08/26 0716) Pulse Rate:  [91-94] 94 (08/26 0716) Resp:  [10-16] 16 (08/26 0716) BP: (130-147)/(61-68) 133/61 (08/26 0716) SpO2:  [96 %-98 %] 97 % (08/26 0716) Last BM Date: 08/02/18  Intake/Output from previous day: 08/25 0701 - 08/26 0700 In: 2422 [P.O.:240; I.V.:2082; IV Piggyback:100] Out: 350 [Urine:350] Intake/Output this shift: No intake/output data recorded.  PE: Gen: Alert, NAD, pleasant and cooperative, up in chair  Pulm: Normal effort Abd: Soft,approp tender, good BS all 4 quadrants, colostomy with small amt stool and gas in pouch, stoma viable. Midline incision as below - >85% granulation tissue, some fibrinous exudate inferior aspect of wound base.   Skin: warm and dry, no rashes  Psych: A&Ox3   Lab Results:  Recent Labs    08/03/18 0353 08/04/18 0224  WBC 8.2 7.8  HGB 8.8* 9.1*  HCT 27.1* 26.7*  PLT 251 401*   BMET Recent Labs    08/03/18 0353 08/04/18 0224  NA 127* 126*  K 3.9 4.1  CL 100 98  CO2 20* 22  GLUCOSE 93 103*  BUN 6* 7*  CREATININE 0.42* 0.48  CALCIUM 8.1* 8.3*   PT/INR No results for input(s): LABPROT, INR in the last 72 hours. CMP     Component Value Date/Time   NA 126 (L) 08/04/2018 0224   NA 132 (L) 07/22/2018 1632   NA 134 (L) 06/06/2017 1259   K 4.1 08/04/2018 0224   K 4.5 06/06/2017 1259   CL 98 08/04/2018 0224   CO2 22 08/04/2018 0224   CO2 24 06/06/2017 1259   GLUCOSE 103 (H) 08/04/2018 0224   GLUCOSE 311 (H) 06/06/2017 1259   BUN 7 (L) 08/04/2018 0224   BUN 32 (H) 07/22/2018 1632   BUN 18.3 06/06/2017 1259   CREATININE 0.48 08/04/2018 0224   CREATININE 1.0 06/06/2017 1259   CALCIUM 8.3 (L) 08/04/2018 0224   CALCIUM 10.6 (H) 06/06/2017 1259   PROT 4.7 (L) 07/26/2018 0311   PROT 6.5 07/22/2018 1632   PROT 7.7 06/06/2017 1259   ALBUMIN 1.5 (L) 08/03/2018 0353   ALBUMIN 3.5 07/22/2018 1632   ALBUMIN 3.3 (L) 06/06/2017 1259   AST 26 07/26/2018 0311   AST 17 06/06/2017 1259   ALT 15 07/26/2018 0311   ALT 14 06/06/2017 1259   ALKPHOS 66 07/26/2018 0311   ALKPHOS 115 06/06/2017 1259   BILITOT 3.1 (H) 07/26/2018 0311   BILITOT 0.9 07/22/2018 1632   BILITOT 0.52 06/06/2017 1259   GFRNONAA >60 08/04/2018 0224   GFRNONAA 80 06/22/2015 1614   GFRAA >60 08/04/2018 0224   GFRAA >89 06/22/2015 1614   Lipase     Component Value Date/Time   LIPASE 24 07/24/2018 0944       Studies/Results: Dg Abd 1 View  Result Date: 08/03/2018 CLINICAL DATA:  Ileus, recent colectomy EXAM: ABDOMEN - 1 VIEW COMPARISON:  08/02/2018 abdominal radiograph FINDINGS: Surgical drain terminates in the left pelvis. Surgical clip overlies the paraspinal right abdomen. Coarsely calcified uterine fibroids are seen overlying the sacrum. Mildly dilated small bowel loops in the left abdomen are not appreciably changed. No evidence of pneumatosis or pneumoperitoneum. No radiopaque nephrolithiasis. IMPRESSION: Stable mildly dilated small  bowel loops in the left abdomen compatible with adynamic ileus in the postoperative state. Electronically Signed   By: Ilona Sorrel M.D.   On: 08/03/2018 07:42    Anti-infectives: Anti-infectives (From admission, onward)   Start     Dose/Rate Route Frequency Ordered Stop   08/03/18 1000  hydroxychloroquine (PLAQUENIL) tablet 200 mg     200 mg Oral 2 times daily 08/03/18 0727     07/24/18 1800  piperacillin-tazobactam (ZOSYN) IVPB 3.375 g     3.375 g 12.5 mL/hr over 240 Minutes Intravenous Every 8 hours 07/24/18 1725 07/31/18 1900   07/24/18 1045  cefTRIAXone (ROCEPHIN) 2 g in sodium chloride 0.9 % 100 mL IVPB  Status:  Discontinued     2 g 200 mL/hr over 30  Minutes Intravenous Every 24 hours 07/24/18 1044 07/24/18 1722   07/24/18 1045  metroNIDAZOLE (FLAGYL) IVPB 500 mg  Status:  Discontinued     500 mg 100 mL/hr over 60 Minutes Intravenous Every 8 hours 07/24/18 1044 07/24/18 1722       Assessment/Plan Principal Problem: Sepsis ([place]) Active Problems: Type 2 diabetes mellitus with complication, without long-term current use of insulin (HCC) CAD (coronary artery disease) Anxiety and depression AAA (abdominal aortic aneurysm) without rupture (HCC) Microcytic anemia Diverticulosis Acute diverticulitis of intestine Bowel perforation (HCC) Hyponatremia  Bowel perforation POD#10S/P Hartman's (left colectomy, end transverse colostomy), Dr. Donne Hazel, 08/16 -bowel function returned -advance to soft diet, continue ensure; discussed 5-6 small meals rather than 3 large.  - JP removed 8/25 - PO pain control - PT/OT, patient needs to [facility]  LKJ:ZPHX, ensure  VTE: SCD's, lovenox TA:VWPVX 08/15-08/21 Follow up:Dr. Donne Hazel  Plan:advance diet, continue current wound care, MOBILIZE    LOS: 11 days    Obie Dredge, [facility] Surgery Pager: [phone]

## 2018-08-04 NOTE — Progress Notes (Signed)
Inpatient Rehabilitation Admissions Coordinator  I await further progress with therapy. Noted the need for encouragement to progress with therapy. I will follow.  Danne Baxter, RN, MSN Rehab Admissions Coordinator [phone] 08/04/2018 1:25 PM

## 2018-08-04 NOTE — Progress Notes (Signed)
Pt transferred into room 537 from [place]. SBAR received from [place], Therapist, sports. Pt oriented to room and call bell system. Skin swarm completed with Margarita Grizzle, RN. See ICU assessment.

## 2018-08-04 NOTE — Clinical Social Work Note (Signed)
30 day note in chart for MD to sign. Paged MD to notify.  Dayton Scrape, [place]

## 2018-08-04 NOTE — Progress Notes (Signed)
Occupational Therapy Treatment Patient Details Name: Melody Casey MRN: [id number] DOB: 01-Mar-1946 Today's Date: 08/04/2018    History of present illness Pt is a 72 y.o. female admitted 07/24/18 with LLQ. 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 Course was complicated by shock, acidosis, hypotension and Afib. ETT 8/16-8/19; self-extubated. PMH: HTN, HLD, diverticulitis, DM, breast ca, CAD, afib, anxiety, AAA.   OT comments  Pt progressing slowly towards established OT goals. Pt requiring Min-Mod A for sitting balance while performing grooming at EOB. Pt presenting with decreased safety awareness and requiring cues to maintain upright posture at EOB and not intentionally lean backwards. Continue to recommend dc to post-acute rehab (unsure of tolerance for CIR vs SNF) and will continue to follow acutely as admitted.    Follow Up Recommendations  CIR    Equipment Recommendations  3 in 1 bedside commode    Recommendations for Other Services      Precautions / Restrictions Precautions Precautions: Fall Precaution Comments: Colostomy Restrictions Weight Bearing Restrictions: No       Mobility Bed Mobility Overal bed mobility: Needs Assistance Bed Mobility: Rolling;Sidelying to Sit;Sit to Sidelying Rolling: Min assist Sidelying to sit: Mod assist     Sit to sidelying: Max assist;+2 for physical assistance General bed mobility comments: Mod A to elevate trunk. Pt able to bring BLEs towards EOB and then use bedrail with VCs. Pt requiring Max A+2 for returning to supine due to pain  Transfers                 General transfer comment: Pt declining mobility    Balance Overall balance assessment: Needs assistance Sitting-balance support: Bilateral upper extremity supported;Feet supported Sitting balance-Leahy Scale: Poor Sitting balance - Comments: Reliant on Min A for upright  posture Postural control: Posterior lean                                 ADL either performed or assessed with clinical judgement   ADL Overall ADL's : Needs assistance/impaired     Grooming: Wash/dry face;Oral care;Minimal assistance;Sitting;Moderate assistance(+2) Grooming Details (indicate cue type and reason): Pt performing oral care while sitting at EOB. Pt requiring Min A for upright posture due to pt leaning backwards with pain. Increasing to Mod A as pt fatigued.                                General ADL Comments: Pt agreeable to perform oral care at EOB. Pt with self limiting behavior     Vision       Perception     Praxis      Cognition Arousal/Alertness: Awake/alert Behavior During Therapy: Flat affect Overall Cognitive Status: Impaired/Different from baseline Area of Impairment: Following commands;Problem solving                       Following Commands: Follows one step commands with increased time     Problem Solving: Slow processing;Decreased initiation General Comments: Pt presenting with self limiting behavior and poor safety awareness. With pain, pt making sudden movements such as falling backways despite cues for safety. Pt requiring cues and encouragment throughout session        Exercises General Exercises - Lower Extremity Ankle Circles/Pumps: AROM;Left;10 reps;Supine   Shoulder Instructions  General Comments VSS    Pertinent Vitals/ Pain       Pain Assessment: Faces Faces Pain Scale: Hurts even more Pain Location: LLE Pain Descriptors / Indicators: Aching;Constant;Discomfort;Grimacing Pain Intervention(s): Monitored during session;Limited activity within patient's tolerance;Repositioned;Patient requesting pain meds-RN notified  Home Living                                          Prior Functioning/Environment              Frequency  Min 2X/week        Progress  Toward Goals  OT Goals(current goals can now be found in the care plan section)  Progress towards OT goals: Progressing toward goals  Acute Rehab OT Goals Patient Stated Goal: none stated OT Goal Formulation: With patient Time For Goal Achievement: 08/13/18 Potential to Achieve Goals: Good ADL Goals Pt Will Perform Grooming: with min guard assist;standing Pt Will Perform Upper Body Dressing: with supervision;with set-up;sitting Pt Will Perform Lower Body Dressing: with min guard assist;sitting/lateral leans Pt Will Transfer to Toilet: with min guard assist;ambulating;bedside commode Pt Will Perform Toileting - Clothing Manipulation and hygiene: with min guard assist;sit to/from stand Additional ADL Goal #1: Pt will perform bed mobility with min guard assist in preparation for ADL.  Plan Discharge plan remains appropriate    Co-evaluation                 AM-PAC PT "6 Clicks" Daily Activity     Outcome Measure   Help from another person eating meals?: None Help from another person taking care of personal grooming?: A Lot Help from another person toileting, which includes using toliet, bedpan, or urinal?: A Lot Help from another person bathing (including washing, rinsing, drying)?: A Lot Help from another person to put on and taking off regular upper body clothing?: A Lot Help from another person to put on and taking off regular lower body clothing?: Total 6 Click Score: 13    End of Session Equipment Utilized During Treatment: Oxygen  OT Visit Diagnosis: Unsteadiness on feet (R26.81);Other abnormalities of gait and mobility (R26.89);Pain;Other symptoms and signs involving cognitive function;Muscle weakness (generalized) (M62.81) Pain - Right/Left: Left Pain - part of body: Leg   Activity Tolerance Patient tolerated treatment well   Patient Left in bed;with call bell/phone within reach;with bed alarm set   Nurse Communication Mobility status;Precautions         Time: 0768-0881 OT Time Calculation (min): 18 min  Charges: OT General Charges $OT Visit: 1 Visit OT Treatments $Self Care/Home Management : 8-22 mins  [place], OTR/L Acute Rehab Pager: [phone] Office: [employer] 08/04/2018, 4:39 PM

## 2018-08-04 NOTE — Progress Notes (Addendum)
[place] for Heparin / warfarin Indication: atrial fibrillation  Allergies  Allergen Reactions  . Miralax [Polyethylene Glycol] Swelling and Rash    Took [facility] brand developed rash. Patient states she tolerated name brand MiraLax in the past.  09/01/15. Patient states she had diffuse swelling including swelling of her lips.   . Vancomycin Rash and Shortness Of Breath  . Acetaminophen Hives  . Oxycodone-Acetaminophen Itching  . Sulfa Antibiotics Rash    All over rash  . Sulfacetamide Sodium Rash    All over rash  . Sulfasalazine Rash    All over rash All over rash  . Banana Other (See Comments)    Unknown  . Latex Rash  . Tape Rash   Patient Measurements: Height: 5\' 8"  (172.7 cm) Weight: 180 lb 1.9 oz (81.7 kg) IBW/kg (Calculated) : 63.9 Heparin Dosing Weight: 76 kg  Vital Signs: Temp: 97.6 F (36.4 C) (08/26 0716) Temp Source: Oral (08/26 0716) BP: 133/61 (08/26 0716) Pulse Rate: 94 (08/26 0716)  Labs: Recent Labs    08/02/18 0227 08/03/18 0353 08/04/18 0224  HGB 8.5* 8.8* 9.1*  HCT 26.9* 27.1* 26.7*  PLT 252 251 401*  HEPARINUNFRC 0.30 0.26*  --   CREATININE 0.45 0.42* 0.48   Estimated Creatinine Clearance: 69.2 mL/min (by C-G formula based on SCr of 0.48 mg/dL).  Assessment: 32 yoF on warfarin and dofetilide PTA for afib. Admitted on 8/15 with abdominal pain and found to have bowel perforation now s/p colectomy and colostomy replacement. Transferred to the ICU postoperatively. Heparin started 8/19 post op for anticoagulation in place of PTA warfarin. CHADsVASC 7 (including Hx of TIA) discussion with MD regarding bridge with heaprin while INR < 2 or no bridge - given stable H/H and CHADsVacs 7 will bridge  Patient was in Afib earlier in hospitalization but converted to SR 8/21 pm after home dofetilide restarted - Keep K > 4 and Mag > 2 and monitor QTc  Heparin drip 1500 uts/hr HL 0.29 at goal - will keep low end of goal to  balance bleed v clot risk  Begin warfarin today   Goal of Therapy:  INR 2-3 Heparin level 0.3-0.5 Monitor platelets by anticoagulation protocol: Yes   Plan:  Continue heparin  1500 units/hr Daily heparin level and CBC and INR Warfarin 5mg  x1 today Monitor for s/sx bleeding  Bonnita Nasuti Pharm.D. CPP, BCPS Clinical Pharmacist [phone] 08/04/2018 10:14 AM

## 2018-08-04 NOTE — NC FL2 (Signed)
[place] LEVEL OF CARE SCREENING TOOL     IDENTIFICATION  Patient Name: Melody Casey Birthdate: 1946-03-04 Sex: female Admission Date (Current Location): 07/24/2018  [facility] and [place] Number:  Herbalist and Address:  [facility] [place]. [facility], [place] [address] [place], [place] [address]      Provider Number: [id number]  Attending Physician Name and Address:  Cristy Folks, MD  Relative Name and Phone Number:       Current Level of Care: Hospital Recommended Level of Care: [place] Prior Approval Number:    Date Approved/Denied:   PASRR Number: Manual review  Discharge Plan: SNF    Current Diagnoses: Patient Active Problem List   Diagnosis Date Noted  . Sepsis ([place]) 07/24/2018  . Acute diverticulitis of intestine 07/24/2018  . Bowel perforation ([place]) 07/24/2018  . Hyponatremia 07/24/2018  . Osteoporosis 08/27/2017  . Low vitamin D level 06/04/2017  . Frequent UTI 10/25/2015  . Microcytic anemia 08/30/2015  . Diverticulosis 08/30/2015  . Hemangioma   . TIA (transient ischemic attack) 08/13/2015  . Atrial fibrillation with rapid ventricular response ([place])   . Rheumatoid arthritis ([place]) 06/20/2015  . AAA (abdominal aortic aneurysm) without rupture ([place]) 01/28/2015  . Persistent atrial fibrillation ([place]) 11/17/2014  . History of scleroderma 02/17/2014  . Malignant neoplasm of upper-outer quadrant of right breast in female, estrogen receptor positive ([place]) 02/01/2014  . Anxiety and depression 12/07/2011  . High risk medication use 06/14/2011  . CAD (coronary artery disease)   . Type 2 diabetes mellitus with complication, without long-term current use of insulin ([place]) 06/07/2008  . HLD (hyperlipidemia) 07/22/2007  . Essential hypertension 07/22/2007    Orientation RESPIRATION BLADDER Height & Weight     Self, Time, Situation, Place  O2(Nasal Canula 3 L) Continent Weight: 180 lb 1.9 oz (81.7 kg) Height:  5'  8" (172.7 cm)  BEHAVIORAL SYMPTOMS/MOOD NEUROLOGICAL BOWEL NUTRITION STATUS  (None) (None) Continent, Colostomy Diet(Soft)  AMBULATORY STATUS COMMUNICATION OF NEEDS Skin   Extensive Assist Verbally Surgical wounds, Bruising, Other (Comment)(Catheter entry/exit.)                       Personal Care Assistance Level of Assistance  Bathing, Feeding, Dressing Bathing Assistance: Limited assistance Feeding assistance: Limited assistance Dressing Assistance: Limited assistance     Functional Limitations Info  Sight, Hearing, Speech Sight Info: Adequate Hearing Info: Adequate Speech Info: Adequate    SPECIAL CARE FACTORS FREQUENCY  PT (By licensed PT), OT (By licensed OT), Speech therapy     PT Frequency: 5 x week OT Frequency: 5 x week     Speech Therapy Frequency: 5 x week      Contractures Contractures Info: Not present    Additional Factors Info  Code Status, Allergies, Psychotropic Code Status Info: Full code Allergies Info: Miralax (Polyethylene Glycol), Vancomycin, Acetaminophen, Oxycodone-acetaminophen, Sulfa Antibiotics, Sulfacetamide Sodium, Sulfasalazine, Banana, Latex, Tape Psychotropic Info: Anxiety, Depression: Xanax 0.25 mg PO TID prn.         Current Medications (08/04/2018):  This is the current hospital active medication list Current Facility-Administered Medications  Medication Dose Route Frequency Provider Last Rate Last Dose  . 0.9 %  sodium chloride infusion   Intravenous Continuous Judeth Horn, MD 10 mL/hr at 08/02/18 0400    . ALPRAZolam Duanne Moron) tablet 0.25 mg  0.25 mg Oral TID PRN Rush Farmer, MD   0.25 mg at 07/31/18 1215  . amLODipine (NORVASC) tablet  2.5 mg  2.5 mg Oral Daily Purohit, Shrey C, MD   2.5 mg at 08/04/18 1037  . atorvastatin (LIPITOR) tablet 40 mg  40 mg Oral q1800 Purohit, Shrey C, MD   40 mg at 08/03/18 1820  . diltiazem (CARDIZEM CD) 24 hr capsule 360 mg  360 mg Oral Daily Purohit, Shrey C, MD   360 mg at 08/04/18 1037  .  dofetilide (TIKOSYN) capsule 250 mcg  250 mcg Oral BID Rush Farmer, MD   250 mcg at 08/03/18 1957  . famotidine (PEPCID) tablet 20 mg  20 mg Oral BID Dana Allan I, MD   20 mg at 08/04/18 1037  . feeding supplement (ENSURE ENLIVE) (ENSURE ENLIVE) liquid 237 mL  237 mL Oral BID BM Jill Alexanders, PA-C   237 mL at 08/04/18 1037  . fentaNYL (SUBLIMAZE) injection 25-50 mcg  25-50 mcg Intravenous Q1H PRN Rush Farmer, MD   50 mcg at 08/03/18 1624  . heparin ADULT infusion 100 units/mL (25000 units/2103mL sodium chloride 0.45%)  1,500 Units/hr Intravenous Continuous Purohit, Shrey C, MD 15 mL/hr at 08/04/18 1056 1,500 Units/hr at 08/04/18 1056  . hydroxychloroquine (PLAQUENIL) tablet 200 mg  200 mg Oral BID Purohit, Shrey C, MD   200 mg at 08/04/18 1037  . insulin aspart (novoLOG) injection 0-9 Units  0-9 Units Subcutaneous TID WC Simaan, Elizabeth S, PA-C      . ipratropium-albuterol (DUONEB) 0.5-2.5 (3) MG/3ML nebulizer solution 3 mL  3 mL Nebulization Q6H PRN Rush Farmer, MD      . magnesium sulfate IVPB 4 g 100 mL  4 g Intravenous Once Purohit, Shrey C, MD 50 mL/hr at 08/04/18 1031 4 g at 08/04/18 1031  . MEDLINE mouth rinse  15 mL Mouth Rinse BID Brand Males, MD   15 mL at 08/03/18 1000  . metoprolol tartrate (LOPRESSOR) injection 2.5 mg  2.5 mg Intravenous Q3H PRN Darlina Sicilian A, NP   2.5 mg at 07/26/18 1419  . ondansetron (ZOFRAN) injection 4 mg  4 mg Intravenous Q4H PRN Saverio Danker, PA-C   4 mg at 08/01/18 0603  . traMADol (ULTRAM) tablet 50 mg  50 mg Oral Q6H PRN Jill Alexanders, PA-C   50 mg at 08/04/18 3428  . warfarin (COUMADIN) tablet 5 mg  5 mg Oral ONCE-1800 Purohit, Konrad Dolores, MD      . Warfarin - Pharmacist Dosing Inpatient   Does not apply [id number] Purohit, Konrad Dolores, MD         Discharge Medications: Please see discharge summary for a list of discharge medications.  Relevant Imaging Results:  Relevant Lab Results:   Additional Information SS#:  [id number]  Candie Chroman, LCSW

## 2018-08-04 NOTE — Progress Notes (Signed)
PROGRESS NOTE    Melody Casey  BSW:[id number] DOB: 27-Aug-1946 DOA: 07/24/2018 PCP: Dettinger, [name], MD    Brief Narrative:  72 year old with past medical history relevant for recurrent diverticulitis, anxiety/depression, intracranial abdominal aortic aneurysm, chronic atrial fibrillation on warfarin and dofetilide, history of nephrolithiasis complicated by right hydronephrosis and stent placement status post stent removal, breast cancer in remission on anastrozole, coronary artery disease status post DES to OM 2 in 2012, hypertension, type 2 diabetes on oral hypoglycemics, hyperlipidemia questionable history of scleroderma as a child who was admitted on 07/24/2018 to any pen with abdominal pain and found to have fecal peritonitis due to perforated diverticulum status post left colectomy, end 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  Her postoperative course has been complicated by episodes of hypotension and A. fib with RVR.      Assessment & Plan:   Principal Problem:   Sepsis ([place]) Active Problems:   Type 2 diabetes mellitus with complication, without long-term current use of insulin (HCC)   CAD (coronary artery disease)   Anxiety and depression   AAA (abdominal aortic aneurysm) without rupture (HCC)   Microcytic anemia   Diverticulosis   Acute diverticulitis of intestine   Bowel perforation (HCC)   Hyponatremia   #) Pan diverticulitis, gated by perforated sigmoid colon and fecal peritonitis status post left hemicolectomy and transverse colostomy placement on 07/25/2018: Bowel function is returning. - Status post Zosyn ended on 07/30/2018 -Advance to soft diet per general surgery -Pain control as tolerated -Pending discharge [facility] to [facility] inpatient rehab -Colostomy teaching needed prior to discharge -Right lower quadrant JP drain discontinued 08/03/2018  #) Chronic atrial fibrillation with RVR: This appears to be better controlled now. - Consolidate to diltiazem 360 mg  daily -Continue dofetilide 250 mcg twice daily, closely monitor QTC -On heparin drip, start warfarin  #) Hypertension/hyperlipidemia/coronary artery disease status post stent: - Calcium channel blocker per above -Continue amlodipine 2.5 mg daily -Continue atorvastatin 40 mg daily -Hold home lisinopril 40 mg daily, will restart as blood pressure tolerates  #) Type 2 diabetes: -Hold home metformin 500 mg daily -Sliding scale insulin, AC at bedtime  #) Breast cancer in remission: -Hold home anastrozole 1 mg daily  #) Questionable history of scleroderma: -Continue hydroxychloroquine 200 mg twice daily  Fluids: Tolerating p.o. Elect lites: Monitor and supplement Nutrition: Soft, bland diet  Prophylaxis: Heparin drip  Disposition: Pending [facility] inpatient rehab   Full code    Consultants:   PCCM  General surgery  [facility] inpatient rehab  Procedures:  ETT 8/16 >8/18 Art line 8/16>8/18 RIJ CVL 8/16 > 8/23  07/25/2018: Left hemicolectomy and transverse colostomy  Antimicrobials:   IV Zosyn 07/24/2018 to 07/30/2018   Subjective: Patient is getting up out of bed today.  She reports that her bowel function is returned.  She does have some ostomy output.  She denies any nausea, vomiting, diarrhea, cough, congestion, rhinorrhea.  She does report early satiety.  Objective: Vitals:   08/03/18 1600 08/03/18 1900 08/03/18 2300 08/04/18 0716  BP: (!) 147/68 137/64 130/64 133/61  Pulse:  93 91 94  Resp:  10 12 16   Temp: (!) 97.5 F (36.4 C) 97.9 F (36.6 C) 98.4 F (36.9 C) 97.6 F (36.4 C)  TempSrc: Oral Oral Oral Oral  SpO2: 96% 98% 96% 97%  Weight:      Height:        Intake/Output Summary (Last 24 hours) at 08/04/2018 0934 Last data filed at 08/04/2018 0500 Gross per 24 hour  Intake 2202 ml  Output 350 ml  Net 1852 ml   Filed Weights   07/24/18 0915 07/26/18 1311 07/29/18 0600  Weight: 72.6 kg 75.8 kg 81.7 kg    Examination:  General exam: Appears calm and  comfortable  Respiratory system: No increased work of breathing, anterior lung sounds clear, no wheezes, crackles, rhonchi. Cardiovascular system: Distant heart sounds, irregularly irregular, no murmurs Gastrointestinal system: Soft, nondistended, mildly tender to palpation over incision, plus bowel sounds in all 4 quadrants. Central nervous system: Alert and oriented. No focal neurological deficits. Extremities: No lower extremity edema, atrophic and contractured right lower extremity Skin: Incision site is clean dry and intact, ostomy site is clean dry and intact Psychiatry: Judgement and insight appear normal. Mood & affect appropriate.     Data Reviewed: I have personally reviewed following labs and imaging studies  CBC: Recent Labs  Lab 07/29/18 0414 07/30/18 0427 07/31/18 0340 08/01/18 0200 08/02/18 0227 08/03/18 0353 08/04/18 0224  WBC 11.0* 9.5 9.0 9.4 9.6 8.2 7.8  NEUTROABS 9.1* 7.7  --   --   --   --   --   HGB 9.6* 10.0* 9.4* 8.9* 8.5* 8.8* 9.1*  HCT 29.2* 30.2* 28.8* 27.5* 26.9* 27.1* 26.7*  MCV 83.9 83.9 85.7 85.7 87.3 85.8 86.1  PLT 200 192 204 216 252 251 315*   Basic Metabolic Panel: Recent Labs  Lab 07/30/18 1514 07/31/18 0934 08/01/18 0200 08/02/18 0227 08/03/18 0353 08/03/18 1338 08/04/18 0224  NA 134* 131* 130* 128* 127*  --  126*  K 3.9 3.4* 3.8 4.0 3.9  --  4.1  CL 100 100 101 100 100  --  98  CO2 27 26 25 23  20*  --  22  GLUCOSE 165* 119* 96 86 93  --  103*  BUN 16 14 10 8  6*  --  7*  CREATININE 0.57 0.46 0.43* 0.45 0.42*  --  0.48  CALCIUM 8.3* 8.4* 8.2* 8.3* 8.1*  --  8.3*  MG  --  1.6* 1.6* 1.7 1.6* 2.2 1.7  PHOS 2.9 2.3*  2.4* 2.4* 2.5  2.5 2.5  2.4*  --   --    GFR: Estimated Creatinine Clearance: 69.2 mL/min (by C-G formula based on SCr of 0.48 mg/dL). Liver Function Tests: Recent Labs  Lab 07/30/18 1514 07/31/18 0934 08/01/18 0200 08/02/18 0227 08/03/18 0353  ALBUMIN 1.6* 1.6* 1.5* 1.5* 1.5*   No results for input(s):  LIPASE, AMYLASE in the last 168 hours. No results for input(s): AMMONIA in the last 168 hours. Coagulation Profile: No results for input(s): INR, PROTIME in the last 168 hours. Cardiac Enzymes: No results for input(s): CKTOTAL, CKMB, CKMBINDEX, TROPONINI in the last 168 hours. BNP (last 3 results) No results for input(s): PROBNP in the last 8760 hours. HbA1C: No results for input(s): HGBA1C in the last 72 hours. CBG: Recent Labs  Lab 08/03/18 1741 08/03/18 2113 08/04/18 0059 08/04/18 0401 08/04/18 0734  GLUCAP 136* 125* 118* 101* 99   Lipid Profile: No results for input(s): CHOL, HDL, LDLCALC, TRIG, CHOLHDL, LDLDIRECT in the last 72 hours. Thyroid Function Tests: No results for input(s): TSH, T4TOTAL, FREET4, T3FREE, THYROIDAB in the last 72 hours. Anemia Panel: No results for input(s): VITAMINB12, FOLATE, FERRITIN, TIBC, IRON, RETICCTPCT in the last 72 hours. Sepsis Labs: No results for input(s): PROCALCITON, LATICACIDVEN in the last 168 hours.  Recent Results (from the past 240 hour(s))  MRSA PCR Screening     Status: None   Collection Time: 07/25/18  1:15 PM  Result Value Ref Range Status   MRSA by PCR NEGATIVE NEGATIVE Final    Comment:        The GeneXpert MRSA Assay (FDA approved for NASAL specimens only), is one component of a comprehensive MRSA colonization surveillance program. It is not intended to diagnose MRSA infection nor to guide or monitor treatment for MRSA infections. Performed at [facility], [place] [address], [place], [place] [address]          Radiology Studies: Dg Abd 1 View  Result Date: 08/03/2018 CLINICAL DATA:  Ileus, recent colectomy EXAM: ABDOMEN - 1 VIEW COMPARISON:  08/02/2018 abdominal radiograph FINDINGS: Surgical drain terminates in the left pelvis. Surgical clip overlies the paraspinal right abdomen. Coarsely calcified uterine fibroids are seen overlying the sacrum. Mildly dilated small bowel loops in the left abdomen are  not appreciably changed. No evidence of pneumatosis or pneumoperitoneum. No radiopaque nephrolithiasis. IMPRESSION: Stable mildly dilated small bowel loops in the left abdomen compatible with adynamic ileus in the postoperative state. Electronically Signed   By: Ilona Sorrel M.D.   On: 08/03/2018 07:42        Scheduled Meds: . amLODipine  2.5 mg Oral Daily  . atorvastatin  40 mg Oral q1800  . diltiazem  90 mg Oral Q6H  . dofetilide  250 mcg Oral BID  . famotidine  20 mg Oral BID  . feeding supplement (ENSURE ENLIVE)  237 mL Oral BID BM  . hydroxychloroquine  200 mg Oral BID  . insulin aspart  0-9 Units Subcutaneous TID WC  . mouth rinse  15 mL Mouth Rinse BID   Continuous Infusions: . sodium chloride 10 mL/hr at 08/02/18 0400  . heparin 1,500 Units/hr (08/04/18 0500)  . magnesium sulfate 1 - 4 g bolus IVPB       LOS: 11 days    Time spent: [place], MD [facility]  If 7PM-7AM, please contact night-coverage [email or web address] Password [facility] 08/04/2018, 9:34 AM

## 2018-08-04 NOTE — Progress Notes (Addendum)
Physical Therapy Treatment Patient Details Name: Melody Casey MRN: [id number] DOB: 02-24-1946 Today's Date: 08/04/2018    History of Present Illness Pt is a 72 y.o. female admitted 07/24/18 with LLQ. 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 Course was complicated by shock, acidosis, hypotension and Afib. ETT 8/16-8/19; self-extubated. PMH: HTN, HLD, diverticulitis, DM, breast ca, CAD, afib, anxiety, AAA.    PT Comments    Pt benefits from frequent encouragement to progress therapy session. Pt able to ambulate 61ft max +2 for physical assistance and safety. Pt limited by pain and weakness in L knee. On arrival pt on 3L [place] with SpO2 100%, able to tolerate session on RA with SpO2 at 90-92%. Question if pt would be able to tolerate CIR (may require SNF) based on slow progress and decreased activity tolerance.     Follow Up Recommendations  CIR;Supervision for mobility/OOB     Equipment Recommendations  Rolling walker with 5" wheels;3in1 (PT)    Recommendations for Other Services       Precautions / Restrictions Precautions Precautions: Fall Precaution Comments: Colostomy    Mobility  Bed Mobility Overal bed mobility: Needs Assistance Bed Mobility: Rolling;Sidelying to Sit Rolling: Mod assist Sidelying to sit: Mod assist       General bed mobility comments: cues for bending knee with assist of pad to rotate pelvis and elevate trunk, increased time  Transfers Overall transfer level: Needs assistance Equipment used: Rolling walker (2 wheeled) Transfers: Sit to/from Stand(3x) Sit to Stand: +2 physical assistance;Max assist;From elevated surface         General transfer comment: modA +2 first sit to stand, two other attemps maxA +2, difficulty moving L LE underneath and weight bearing on LLE. repeated verbal cues for hand placement on bed to assist with stand.    Ambulation/Gait Ambulation/Gait assistance: Max assist;+2 physical assistance;+2 safety/equipment Gait Distance (Feet): 8 Feet Assistive device: Rolling walker (2 wheeled) Gait Pattern/deviations: Step-to pattern;Antalgic;Trunk flexed Gait velocity: decreased  Gait velocity interpretation: <1.31 ft/sec, indicative of household ambulator General Gait Details: trunk flexed throughout, frequent verbal and tactile cues for posture and hand placement on RW, decreased WB on LLE with feeling of "giving way". Need for sacral assist    Stairs             Wheelchair Mobility    Modified Rankin (Stroke Patients Only)       Balance Overall balance assessment: Needs assistance Sitting-balance support: Bilateral upper extremity supported;Feet supported Sitting balance-Leahy Scale: Poor Sitting balance - Comments: reliant on UE support to pull trunk forward. Pt require modA +1 EOB to maintain balance w/o use of rail support. Without rail leans backwards without ability to self-correct.  Postural control: Posterior lean Standing balance support: Bilateral upper extremity supported Standing balance-Leahy Scale: Zero Standing balance comment: modA +2 requires B UE on RW and external support                             Cognition Arousal/Alertness: Awake/alert Behavior During Therapy: Flat affect Overall Cognitive Status: Impaired/Different from baseline Area of Impairment: Following commands;Problem solving                       Following Commands: Follows one step commands with increased time     Problem Solving: Slow processing;Decreased initiation General Comments: pt with slow processing and initiation with  cues for sequence of all movement and transfers      Exercises General Exercises - Lower Extremity Long Arc Quad: AROM;Both;10 reps;Seated    General Comments General comments (skin integrity, edema, etc.): VSS       Pertinent Vitals/Pain Pain  Score: 4  Pain Location: left knee with movement Pain Descriptors / Indicators: Aching Pain Intervention(s): Limited activity within patient's tolerance;Repositioned;Monitored during session    Home Living                      Prior Function            PT Goals (current goals can now be found in the care plan section) Progress towards PT goals: Progressing toward goals    Frequency    Min 3X/week      PT Plan Current plan remains appropriate    Co-evaluation              AM-PAC PT "6 Clicks" Daily Activity  Outcome Measure  Difficulty turning over in bed (including adjusting bedclothes, sheets and blankets)?: Unable Difficulty moving from lying on back to sitting on the side of the bed? : Unable Difficulty sitting down on and standing up from a chair with arms (e.g., wheelchair, bedside commode, etc,.)?: Unable Help needed moving to and from a bed to chair (including a wheelchair)?: A Lot Help needed walking in hospital room?: Total Help needed climbing 3-5 steps with a railing? : Total 6 Click Score: 7    End of Session Equipment Utilized During Treatment: Gait belt Activity Tolerance: Patient limited by pain;Patient limited by fatigue Patient left: in chair;with call bell/phone within reach;with chair alarm set Nurse Communication: Mobility status PT Visit Diagnosis: Other abnormalities of gait and mobility (R26.89);Muscle weakness (generalized) (M62.81)     Time: 1103-1594 PT Time Calculation (min) (ACUTE ONLY): 36 min  Charges:  $Therapeutic Activity: 23-37 mins                     Samuella Bruin, [place]  Acute Rehab [phone]    Samuella Bruin 08/04/2018, 9:29 AM

## 2018-08-05 LAB — GLUCOSE, CAPILLARY
GLUCOSE-CAPILLARY: 115 mg/dL — AB (ref 70–99)
GLUCOSE-CAPILLARY: 95 mg/dL (ref 70–99)
GLUCOSE-CAPILLARY: 99 mg/dL (ref 70–99)
Glucose-Capillary: 100 mg/dL — ABNORMAL HIGH (ref 70–99)
Glucose-Capillary: 83 mg/dL (ref 70–99)

## 2018-08-05 LAB — BASIC METABOLIC PANEL WITH GFR
Anion gap: 6 (ref 5–15)
CO2: 21 mmol/L — ABNORMAL LOW (ref 22–32)
Chloride: 102 mmol/L (ref 98–111)
Potassium: 3.8 mmol/L (ref 3.5–5.1)

## 2018-08-05 LAB — BASIC METABOLIC PANEL
BUN: 7 mg/dL — ABNORMAL LOW (ref 8–23)
Calcium: 8.4 mg/dL — ABNORMAL LOW (ref 8.9–10.3)
Creatinine, Ser: 0.39 mg/dL — ABNORMAL LOW (ref 0.44–1.00)
GFR calc Af Amer: >60 mL/min (ref >60)
GFR calc non Af Amer: >60 mL/min (ref >60)
Glucose, Bld: 103 mg/dL — ABNORMAL HIGH (ref 70–99)
Sodium: 129 mmol/L — ABNORMAL LOW (ref 135–145)

## 2018-08-05 LAB — PROTIME-INR
INR: 1.27
Prothrombin Time: 15.8 s — ABNORMAL HIGH (ref 11.4–15.2)

## 2018-08-05 LAB — MAGNESIUM: Magnesium: 1.8 mg/dL (ref 1.7–2.4)

## 2018-08-05 LAB — HEPARIN LEVEL (UNFRACTIONATED): Heparin Unfractionated: 0.59 IU/mL (ref 0.30–0.70)

## 2018-08-05 MED ORDER — WARFARIN SODIUM 5 MG PO TABS
5.0000 mg | ORAL_TABLET | Freq: Once | ORAL | Status: AC
  Administered 2018-08-05: 5 mg via ORAL
  Filled 2018-08-05: qty 1

## 2018-08-05 MED ORDER — POTASSIUM CHLORIDE CRYS ER 20 MEQ PO TBCR
40.0000 meq | EXTENDED_RELEASE_TABLET | Freq: Once | ORAL | Status: AC
  Administered 2018-08-05: 40 meq via ORAL
  Filled 2018-08-05: qty 2

## 2018-08-05 NOTE — Progress Notes (Signed)
[facility]/Trauma Progress Note  11 Days Post-Op   Assessment/Plan Principal Problem: Sepsis ([place]) Active Problems: Type 2 diabetes mellitus with complication, without long-term current use of insulin (HCC) CAD (coronary artery disease) Anxiety and depression AAA (abdominal aortic aneurysm) without rupture (HCC) Microcytic anemia Diverticulosis Acute diverticulitis of intestine Bowel perforation (HCC) Hyponatremia  Bowel perforation POD#11S/P Hartman's (left colectomy, end transverse colostomy), Dr. Donne Hazel, 08/16 -bowel function returned -advance to soft diet, continue ensure -JP removed 8/25 - PO pain control - PT/OT, patient needs to [facility]  JJO:ACZY, ensure  VTE: SCD's, lovenox SA:YTKZS 08/15-08/21 Follow up:Dr. Donne Hazel  Plan:continue current wound care, MOBILIZE    LOS: 12 days    Subjective: CC: abdominal pain  Pt is working with PT. He states she is tired. She is tolerating her diet. She is spunky today.   Objective: Vital signs in last 24 hours: Temp:  [97.5 F (36.4 C)-98.4 F (36.9 C)] 97.7 F (36.5 C) (08/27 0446) Pulse Rate:  [84-97] 91 (08/27 0446) Resp:  [17-20] 17 (08/27 0446) BP: (130-151)/(54-71) 145/71 (08/27 0446) SpO2:  [95 %-96 %] 96 % (08/27 0446) Weight:  [75.3 kg] 75.3 kg (08/26 1706) Last BM Date: 08/05/18  Intake/Output from previous day: 08/26 0701 - 08/27 0700 In: 758.4 [P.O.:480; I.V.:178.4; IV Piggyback:100] Out: 650 [Urine:350; Stool:300] Intake/Output this shift: Total I/O In: 420 [P.O.:420] Out: 50 [Stool:50]  PE: Gen: Alert, NAD, pleasant and cooperative Pulm: Normal effort Abd: Soft,approp tender,colostomy with small amt stool and gas in pouch, stoma viable. Midline incision C/D/I  Anti-infectives: Anti-infectives (From admission, onward)   Start     Dose/Rate Route Frequency Ordered Stop   08/03/18 1000  hydroxychloroquine (PLAQUENIL) tablet 200 mg     200 mg Oral 2 times daily 08/03/18 0727     07/24/18 1800  piperacillin-tazobactam (ZOSYN) IVPB 3.375 g     3.375 g 12.5 mL/hr over 240 Minutes Intravenous Every 8 hours 07/24/18 1725 07/31/18 1900   07/24/18 1045  cefTRIAXone (ROCEPHIN) 2 g in sodium chloride 0.9 % 100 mL IVPB  Status:  Discontinued     2 g 200 mL/hr over 30 Minutes Intravenous Every 24 hours 07/24/18 1044 07/24/18 1722   07/24/18 1045  metroNIDAZOLE (FLAGYL) IVPB 500 mg  Status:  Discontinued     500 mg 100 mL/hr over 60 Minutes Intravenous Every 8 hours 07/24/18 1044 07/24/18 1722      Lab Results:  Recent Labs    08/03/18 0353 08/04/18 0224  WBC 8.2 7.8  HGB 8.8* 9.1*  HCT 27.1* 26.7*  PLT 251 401*   BMET Recent Labs    08/04/18 0224 08/05/18 0546  NA 126* 129*  K 4.1 3.8  CL 98 102  CO2 22 21*  GLUCOSE 103* 103*  BUN 7* 7*  CREATININE 0.48 0.39*  CALCIUM 8.3* 8.4*   PT/INR Recent Labs    08/05/18 0546  LABPROT 15.8*  INR 1.27   CMP     Component Value Date/Time   NA 129 (L) 08/05/2018 0546   NA 132 (L) 07/22/2018 1632   NA 134 (L) 06/06/2017 1259   K 3.8 08/05/2018 0546   K 4.5 06/06/2017 1259   CL 102 08/05/2018 0546   CO2 21 (L) 08/05/2018 0546   CO2 24 06/06/2017 1259   GLUCOSE 103 (H) 08/05/2018 0546   GLUCOSE 311 (H) 06/06/2017 1259   BUN 7 (L) 08/05/2018 0546   BUN 32 (H) 07/22/2018 1632   BUN 18.3 06/06/2017 1259   CREATININE 0.39 (  L) 08/05/2018 0546   CREATININE 1.0 06/06/2017 1259   CALCIUM 8.4 (L) 08/05/2018 0546   CALCIUM 10.6 (H) 06/06/2017 1259   PROT 4.7 (L) 07/26/2018 0311   PROT 6.5 07/22/2018 1632   PROT 7.7 06/06/2017 1259   ALBUMIN 1.5 (L) 08/03/2018 0353   ALBUMIN 3.5 07/22/2018 1632   ALBUMIN 3.3 (L) 06/06/2017 1259   AST 26 07/26/2018 0311   AST 17 06/06/2017 1259   ALT 15 07/26/2018 0311   ALT 14 06/06/2017 1259   ALKPHOS 66 07/26/2018 0311   ALKPHOS 115 06/06/2017 1259   BILITOT 3.1 (H) 07/26/2018 0311   BILITOT 0.9 07/22/2018 1632   BILITOT  0.52 06/06/2017 1259   GFRNONAA >60 08/05/2018 0546   GFRNONAA 80 06/22/2015 1614   GFRAA >60 08/05/2018 0546   GFRAA >89 06/22/2015 1614   Lipase     Component Value Date/Time   LIPASE 24 07/24/2018 0944    Studies/Results: No results found.    Kalman Drape , [facility] 08/05/2018, 11:39 AM  Pager: [phone] Mon-Wed, Friday 7:00am-4:30pm Thurs 7am-11:30am  Consults: [phone]

## 2018-08-05 NOTE — Progress Notes (Signed)
PROGRESS NOTE    Melody Casey  STM:[id number] DOB: 14-Oct-1946 DOA: 07/24/2018 PCP: Dettinger, [name], MD    Brief Narrative:  72 year old with past medical history relevant for recurrent diverticulitis, anxiety/depression, intracranial abdominal aortic aneurysm, chronic atrial fibrillation on warfarin and dofetilide, history of nephrolithiasis complicated by right hydronephrosis and stent placement status post stent removal, breast cancer in remission on anastrozole, coronary artery disease status post DES to OM 2 in 2012, hypertension, type 2 diabetes on oral hypoglycemics, hyperlipidemia questionable history of scleroderma as a child who was admitted on 07/24/2018 to any pen with abdominal pain and found to have fecal peritonitis due to perforated diverticulum status post left colectomy, end 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  Her postoperative course has been complicated by episodes of hypotension and A. fib with RVR.      Assessment & Plan:   Principal Problem:   Sepsis ([place]) Active Problems:   Type 2 diabetes mellitus with complication, without long-term current use of insulin (HCC)   CAD (coronary artery disease)   Anxiety and depression   AAA (abdominal aortic aneurysm) without rupture (HCC)   Microcytic anemia   Diverticulosis   Acute diverticulitis of intestine   Bowel perforation (HCC)   Hyponatremia   #) Pan diverticulitis, gated by perforated sigmoid colon and fecal peritonitis status post left hemicolectomy and transverse colostomy placement on 07/25/2018: Bowel function is returning. - Status post Zosyn ended on 07/30/2018 -Continue soft diet per general surgery -Pain control as tolerated -Pending discharge [facility] to [facility] inpatient rehab -Colostomy teaching needed prior to discharge -Right lower quadrant JP drain discontinued 08/03/2018  #) Chronic atrial fibrillation with RVR: This appears to be better controlled now. - Consolidate to diltiazem 360 mg  daily -Continue dofetilide 250 mcg twice daily, closely monitor QTC -On heparin drip, start warfarin, pharmacy consult for warfarin  #) Hypertension/hyperlipidemia/coronary artery disease status post stent: - Calcium channel blocker per above -Continue amlodipine 2.5 mg daily -Continue atorvastatin 40 mg daily -Hold home lisinopril 40 mg daily, will restart as blood pressure tolerates  #) Type 2 diabetes: -Hold home metformin 500 mg daily -Sliding scale insulin, AC at bedtime  #) Breast cancer in remission: -Hold home anastrozole 1 mg daily  #) Questionable history of scleroderma: -Continue hydroxychloroquine 200 mg twice daily  Fluids: Tolerating p.o. Elect lites: Monitor and supplement Nutrition: Soft, bland diet  Prophylaxis: Heparin drip  Disposition: Pending [facility] inpatient rehab   Full code    Consultants:   PCCM  General surgery  [facility] inpatient rehab  Procedures:  ETT 8/16 >8/18 Art line 8/16>8/18 RIJ CVL 8/16 > 8/23  07/25/2018: Left hemicolectomy and transverse colostomy  Antimicrobials:   IV Zosyn 07/24/2018 to 07/30/2018   Subjective: Patient did well today.  She does not have any complaints.  She reports that she tolerated soft bland diet well.  She denies any nausea, vomiting, diarrhea, cough, congestion, rhinorrhea.  Objective: Vitals:   08/04/18 1552 08/04/18 1706 08/04/18 2221 08/05/18 0446  BP: (!) 143/54 130/60 (!) 151/69 (!) 145/71  Pulse: 84 87 97 91  Resp: 20 18 17 17   Temp: 97.7 F (36.5 C) (!) 97.5 F (36.4 C) 98.4 F (36.9 C) 97.7 F (36.5 C)  TempSrc: Oral Oral Oral Oral  SpO2: 95% 95% 95% 96%  Weight:  75.3 kg    Height:  5\' 8"  (1.727 m)      Intake/Output Summary (Last 24 hours) at 08/05/2018 0954 Last data filed at 08/05/2018 0800 Gross per 24 hour  Intake 998.41 ml  Output 650 ml  Net 348.41 ml   Filed Weights   07/26/18 1311 07/29/18 0600 08/04/18 1706  Weight: 75.8 kg 81.7 kg 75.3 kg     Examination:  General exam: Appears calm and comfortable  Respiratory system: No increased work of breathing, anterior lung sounds clear, no wheezes, crackles, rhonchi. Cardiovascular system: Distant heart sounds, regular rate and rhythm, no murmurs Gastrointestinal system: Soft, nondistended, mildly tender to palpation over incision, plus bowel sounds in all 4 quadrants. Central nervous system: Alert and oriented. No focal neurological deficits. Extremities: No lower extremity edema, atrophic and contractured right lower extremity Skin: Incision site is clean dry and intact, ostomy site is clean dry and intact Psychiatry: Judgement and insight appear normal. Mood & affect appropriate.     Data Reviewed: I have personally reviewed following labs and imaging studies  CBC: Recent Labs  Lab 07/30/18 0427 07/31/18 0340 08/01/18 0200 08/02/18 0227 08/03/18 0353 08/04/18 0224  WBC 9.5 9.0 9.4 9.6 8.2 7.8  NEUTROABS 7.7  --   --   --   --   --   HGB 10.0* 9.4* 8.9* 8.5* 8.8* 9.1*  HCT 30.2* 28.8* 27.5* 26.9* 27.1* 26.7*  MCV 83.9 85.7 85.7 87.3 85.8 86.1  PLT 192 204 216 252 251 263*   Basic Metabolic Panel: Recent Labs  Lab 07/30/18 1514 07/31/18 0934 08/01/18 0200 08/02/18 0227 08/03/18 0353 08/03/18 1338 08/04/18 0224 08/05/18 0546  NA 134* 131* 130* 128* 127*  --  126* 129*  K 3.9 3.4* 3.8 4.0 3.9  --  4.1 3.8  CL 100 100 101 100 100  --  98 102  CO2 27 26 25 23  20*  --  22 21*  GLUCOSE 165* 119* 96 86 93  --  103* 103*  BUN 16 14 10 8  6*  --  7* 7*  CREATININE 0.57 0.46 0.43* 0.45 0.42*  --  0.48 0.39*  CALCIUM 8.3* 8.4* 8.2* 8.3* 8.1*  --  8.3* 8.4*  MG  --  1.6* 1.6* 1.7 1.6* 2.2 1.7 1.8  PHOS 2.9 2.3*  2.4* 2.4* 2.5  2.5 2.5  2.4*  --   --   --    GFR: Estimated Creatinine Clearance: 62.2 mL/min (A) (by C-G formula based on SCr of 0.39 mg/dL (L)). Liver Function Tests: Recent Labs  Lab 07/30/18 1514 07/31/18 0934 08/01/18 0200 08/02/18 0227  08/03/18 0353  ALBUMIN 1.6* 1.6* 1.5* 1.5* 1.5*   No results for input(s): LIPASE, AMYLASE in the last 168 hours. No results for input(s): AMMONIA in the last 168 hours. Coagulation Profile: Recent Labs  Lab 08/05/18 0546  INR 1.27   Cardiac Enzymes: No results for input(s): CKTOTAL, CKMB, CKMBINDEX, TROPONINI in the last 168 hours. BNP (last 3 results) No results for input(s): PROBNP in the last 8760 hours. HbA1C: No results for input(s): HGBA1C in the last 72 hours. CBG: Recent Labs  Lab 08/04/18 0734 08/04/18 1122 08/04/18 1756 08/04/18 2229 08/05/18 0758  GLUCAP 99 118* 121* 118* 95   Lipid Profile: No results for input(s): CHOL, HDL, LDLCALC, TRIG, CHOLHDL, LDLDIRECT in the last 72 hours. Thyroid Function Tests: No results for input(s): TSH, T4TOTAL, FREET4, T3FREE, THYROIDAB in the last 72 hours. Anemia Panel: No results for input(s): VITAMINB12, FOLATE, FERRITIN, TIBC, IRON, RETICCTPCT in the last 72 hours. Sepsis Labs: No results for input(s): PROCALCITON, LATICACIDVEN in the last 168 hours.  No results found for this or any previous visit (from the past  240 hour(s)).       Radiology Studies: No results found.      Scheduled Meds: . amLODipine  2.5 mg Oral Daily  . atorvastatin  40 mg Oral q1800  . diltiazem  360 mg Oral Daily  . dofetilide  250 mcg Oral BID  . famotidine  20 mg Oral BID  . feeding supplement (ENSURE ENLIVE)  237 mL Oral BID BM  . hydroxychloroquine  200 mg Oral BID  . insulin aspart  0-9 Units Subcutaneous TID WC  . mouth rinse  15 mL Mouth Rinse BID  . Warfarin - Pharmacist Dosing Inpatient   Does not apply q1800   Continuous Infusions: . sodium chloride 10 mL/hr at 08/02/18 0400  . heparin 1,500 Units/hr (08/04/18 2027)     LOS: 12 days    Time spent: [place], MD [facility]  If 7PM-7AM, please contact night-coverage [email or web address] Password [facility] 08/05/2018, 9:54 AM

## 2018-08-05 NOTE — Progress Notes (Signed)
[place] for Heparin >> warfarin Indication: atrial fibrillation  Allergies  Allergen Reactions  . Miralax [Polyethylene Glycol] Swelling and Rash    Took [facility] brand developed rash. Patient states she tolerated name brand MiraLax in the past.  09/01/15. Patient states she had diffuse swelling including swelling of her lips.   . Vancomycin Rash and Shortness Of Breath  . Acetaminophen Hives  . Oxycodone-Acetaminophen Itching  . Sulfa Antibiotics Rash    All over rash  . Sulfacetamide Sodium Rash    All over rash  . Sulfasalazine Rash    All over rash All over rash  . Banana Other (See Comments)    Unknown  . Latex Rash  . Tape Rash   Patient Measurements: Height: 5\' 8"  (172.7 cm) Weight: 166 lb (75.3 kg) IBW/kg (Calculated) : 63.9 Heparin Dosing Weight: 76 kg  Vital Signs: Temp: 98.3 F (36.8 C) (08/27 1318) Temp Source: Oral (08/27 0446) BP: 121/58 (08/27 1318) Pulse Rate: 88 (08/27 1318)  Labs: Recent Labs    08/03/18 0353 08/04/18 0224 08/04/18 1021 08/05/18 0546  HGB 8.8* 9.1*  --   --   HCT 27.1* 26.7*  --   --   PLT 251 401*  --   --   LABPROT  --   --   --  15.8*  INR  --   --   --  1.27  HEPARINUNFRC 0.26*  --  0.29* 0.59  CREATININE 0.42* 0.48  --  0.39*   Estimated Creatinine Clearance: 62.2 mL/min (A) (by C-G formula based on SCr of 0.39 mg/dL (L)).  Assessment: 57 yoF on warfarin and dofetilide PTA for afib. Admitted on 8/15 with abdominal pain and found to have bowel perforation now s/p colectomy and colostomy placement. Transferred to the ICU postoperatively. Heparin started 8/19 post op for anticoagulation in place of PTA warfarin.   Warfarin resumed 8/26.  Heparin drip therapeutic on 1500 units/hr.  INR low as expected after first warfarin dose.   Goal of Therapy:  INR 2-3 Heparin level 0.3-0.5 Monitor platelets by anticoagulation protocol: Yes   Plan:  Continue heparin at 1500 units/hr Daily  heparin level and CBC and INR Repeat Warfarin 5mg  x1 today Monitor for s/sx bleeding  [employer], Pharm.D., BCPS Clinical Pharmacist Pager: [phone] Clinical phone for 08/05/2018 from 8:30-4:00 is [phone].  **Pharmacist phone directory can now be found on [email or web address] ([email or web address]).  Listed under [place].  08/05/2018 3:28 PM

## 2018-08-05 NOTE — Consult Note (Signed)
[place] Nurse ostomy follow up Stoma type/location: LLQ, end stoma Stomal assessment/size: 2 1/4" round, budded, edematous  Peristomal assessment: intact, tenderness noted between stoma and wound, skin is intact  Treatment options for stomal/peristomal skin: used 1/2 2" barrier ring between the stoma and the midline open wound  Output dark, green, some formed Ostomy pouching: 1pc.flat, changed to 1pc for flexibility.  2pc pouch has been leaking at [place] every day and leaking into the wound bed. This area has a dip in the abdominal topography and is very near to the midline wound.  Education provided: Explained role of ostomy nurse and creation of stoma  Explained stoma characteristics (budded, flush, color, texture, care) Demonstrated pouch change (cutting new skin barrier, measuring stoma, cleaning peristomal skin and stoma, use of barrier ring) Education on emptying when 1/3 to 1/2 full and how to empty Demonstrated use of wick to clean spout   Husband was to meet [place] at [place] for teaching session. Patient reports he's not going to be here. "he is getting tires on the back of the truck".  We planned another session with her husband who she desires to learn as well on Thursday.  Patient has no other family here in town. Will need HHRN for assistance at home after DC.  May DC to inpatient rehab per notes in the chart.     Enrolled patient in [place] Start Discharge program: Yes  [place] Nurse wound follow up Wound type: midline incision, open Measurement:19cm x 6.5cm x 8.5 at distal end Wound bed: clean, moist and pink Drainage (amount, consistency, odor) moderate, unable to assess consistency due to stool contamination in the wound bed Periwound: intact  Dressing procedure/placement/frequency: Saline moist gauze, cover with dry dressing, secure with tape.   May benefit from NPWT dressing, would require to be changed with ostomy pouch each time due to proximity of wound to the  stoma.  Would suggest placement tomorrow for M/W/F schedule.  [place] nurse team will follow along for support with ostomy care and wound care as needed.   [place], [place], [place]

## 2018-08-05 NOTE — Progress Notes (Signed)
Nutrition Follow-up  DOCUMENTATION CODES:   Not applicable  INTERVENTION:   Continue Ensure Enlive po BID, each supplement provides 350 kcal and 20 grams of protein  Reviewed menu options and encouraged PO intake  NUTRITION DIAGNOSIS:   Inadequate oral intake related to decreased appetite as evidenced by meal completion < 50%.  Ongoing.   GOAL:   Patient will meet greater than or equal to 90% of their needs  Progressing.   MONITOR:   PO intake, Supplement acceptance  ASSESSMENT:   72 y/o female PMHx Afib, CAD, Tobacco abuse, HTN/HLD, DM2, diverticulitis, Breast cancer. Presented to ED 8/15 d/t constipation and LLQ pain radiating to periumbilical area. CT showed perforation w/ pneumoperitoneum s/p colectomy w/ colostomy 8/16. Transferred to ICU post op w/ shock and acidosis.  8/19 extubated 8/27 diet advanced to soft  Per pt appetite remains decreased after surgery. Pt ate 1/2 sandwich for lunch today. She has tried the ensure and has drank some today but not two as ordered.  Per notes plan for CIR at d/c.  Discussed with pt importance of adequate intake to meet nutrition needs for increasing strength to work with PT.  Pt agreeable to drinking ensure while appetite decreased.   Medications and labs reviewed  Diet Order:   Diet Order            DIET SOFT Room service appropriate? Yes; Fluid consistency: Thin  Diet effective now              EDUCATION NEEDS:   Education needs have been addressed  Skin:  Skin Assessment: Skin Integrity Issues: Skin Integrity Issues:: Incisions Incisions: Abdomen. New colostomy  Last BM:  300 ml stool output via colostomy x 24 hr  Height:   Ht Readings from Last 1 Encounters:  08/04/18 5\' 8"  (1.727 m)    Weight:   Wt Readings from Last 1 Encounters:  08/04/18 75.3 kg    Ideal Body Weight:  52.27 kg  BMI:  Body mass index is 25.24 kg/m.  Estimated Nutritional Needs:   Kcal:  1700-1900  Protein:  100-115  gm  Fluid:  1.8 L   Maylon Peppers RD, LDN, CNSC [phone] Pager [phone] After Hours Pager

## 2018-08-05 NOTE — Care Management Important Message (Signed)
Important Message  Patient Details  Name: Melody Casey MRN: [id number] Date of Birth: Nov 14, 1946   Medicare Important Message Given:  Yes    Orbie Pyo 08/05/2018, 3:46 PM

## 2018-08-05 NOTE — Progress Notes (Signed)
Physical Therapy Treatment Patient Details Name: Melody Casey MRN: [id number] DOB: 03/25/1946 Today's Date: 08/05/2018    History of Present Illness Pt is a 72 y.o. female admitted 07/24/18 with LLQ. 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 Course was complicated by shock, acidosis, hypotension and Afib. ETT 8/16-8/19; self-extubated. PMH: HTN, HLD, diverticulitis, DM, breast ca, CAD, afib, anxiety, AAA.    PT Comments    Patient seen for mobility progression. Patient requires +2 assistance for OOB mobility and limited by fatigue and pain in L LE. Pt will continue to benefit from further skilled PT services both acute and post acute to maximize independence and safety with mobility.   Follow Up Recommendations  CIR;Supervision for mobility/OOB     Equipment Recommendations  Rolling walker with 5" wheels;3in1 (PT)    Recommendations for Other Services OT consult;Rehab consult     Precautions / Restrictions Precautions Precautions: Fall Precaution Comments: Colostomy Restrictions Weight Bearing Restrictions: No    Mobility  Bed Mobility Overal bed mobility: Needs Assistance Bed Mobility: Rolling;Sidelying to Sit Rolling: Min assist Sidelying to sit: Mod assist       General bed mobility comments: assistance required to bring hips to EOB and to elevate trunk into sitting  Transfers Overall transfer level: Needs assistance Equipment used: Rolling walker (2 wheeled) Transfers: Sit to/from Stand Sit to Stand: +2 physical assistance;Max assist Stand pivot transfers: +2 physical assistance;Mod assist       General transfer comment: assistance required to power up into standing and multimodal cues for posture; assistance to guide RW when pivoting bed to [facility]; recliner brought up behind pt after use of BSC; cues for hand placement and technique  Ambulation/Gait              General Gait Details: after stand pivot to [facility] pt felt too fatigued to attempt gait    Stairs             Wheelchair Mobility    Modified Rankin (Stroke Patients Only)       Balance Overall balance assessment: Needs assistance Sitting-balance support: Bilateral upper extremity supported;Feet supported Sitting balance-Leahy Scale: Poor   Postural control: Posterior lean Standing balance support: Bilateral upper extremity supported Standing balance-Leahy Scale: Poor                              Cognition Arousal/Alertness: Awake/alert Behavior During Therapy: WFL for tasks assessed/performed Overall Cognitive Status: No family/caregiver present to determine baseline cognitive functioning                                 General Comments: Pt presenting with self limiting behavior and poor safety awareness. Pt requiring cues and encouragment throughout session      Exercises      General Comments General comments (skin integrity, edema, etc.): L LE edema       Pertinent Vitals/Pain Pain Assessment: Faces Faces Pain Scale: Hurts even more Pain Location: LLE Pain Descriptors / Indicators: Discomfort;Grimacing;Heaviness;Tightness Pain Intervention(s): Limited activity within patient's tolerance;Monitored during session;Repositioned    Home Living                      Prior Function            PT Goals (current goals can now  be found in the care plan section) Acute Rehab PT Goals Patient Stated Goal: none stated PT Goal Formulation: With patient Time For Goal Achievement: 08/12/18 Potential to Achieve Goals: Good Progress towards PT goals: Progressing toward goals    Frequency    Min 3X/week      PT Plan Current plan remains appropriate    Co-evaluation              AM-PAC PT "6 Clicks" Daily Activity  Outcome Measure  Difficulty turning over in bed (including adjusting bedclothes, sheets and blankets)?:  Unable Difficulty moving from lying on back to sitting on the side of the bed? : Unable Difficulty sitting down on and standing up from a chair with arms (e.g., wheelchair, bedside commode, etc,.)?: Unable Help needed moving to and from a bed to chair (including a wheelchair)?: A Lot Help needed walking in hospital room?: Total Help needed climbing 3-5 steps with a railing? : Total 6 Click Score: 7    End of Session Equipment Utilized During Treatment: Gait belt Activity Tolerance: Patient limited by pain;Patient limited by fatigue Patient left: in chair;with call bell/phone within reach;with chair alarm set Nurse Communication: Mobility status PT Visit Diagnosis: Other abnormalities of gait and mobility (R26.89);Muscle weakness (generalized) (M62.81)     Time: 2563-8937 PT Time Calculation (min) (ACUTE ONLY): 37 min  Charges:  $Therapeutic Activity: 23-37 mins                     Earney Navy, PTA Pager: [phone]     Darliss Cheney 08/05/2018, 3:04 PM

## 2018-08-06 ENCOUNTER — Encounter (HOSPITAL_COMMUNITY): Payer: Self-pay

## 2018-08-06 ENCOUNTER — Inpatient Hospital Stay (HOSPITAL_COMMUNITY)
Admission: RE | Admit: 2018-08-06 | Discharge: 2018-08-23 | DRG: 945 | Disposition: A | Discharge status: Home Health | Payer: Medicare Other | Source: Intra-hospital | Attending: Physical Medicine & Rehabilitation | Admitting: Physical Medicine & Rehabilitation

## 2018-08-06 ENCOUNTER — Other Ambulatory Visit: Payer: Self-pay

## 2018-08-06 DIAGNOSIS — I2510 Atherosclerotic heart disease of native coronary artery without angina pectoris: Secondary | ICD-10-CM | POA: Diagnosis present

## 2018-08-06 DIAGNOSIS — I10 Essential (primary) hypertension: Secondary | ICD-10-CM | POA: Diagnosis present

## 2018-08-06 DIAGNOSIS — Z801 Family history of malignant neoplasm of trachea, bronchus and lung: Secondary | ICD-10-CM

## 2018-08-06 DIAGNOSIS — F419 Anxiety disorder, unspecified: Secondary | ICD-10-CM | POA: Diagnosis present

## 2018-08-06 DIAGNOSIS — D62 Acute posthemorrhagic anemia: Secondary | ICD-10-CM | POA: Diagnosis present

## 2018-08-06 DIAGNOSIS — R600 Localized edema: Secondary | ICD-10-CM | POA: Diagnosis present

## 2018-08-06 DIAGNOSIS — I252 Old myocardial infarction: Secondary | ICD-10-CM

## 2018-08-06 DIAGNOSIS — R5381 Other malaise: Secondary | ICD-10-CM | POA: Diagnosis present

## 2018-08-06 DIAGNOSIS — I4891 Unspecified atrial fibrillation: Secondary | ICD-10-CM | POA: Diagnosis present

## 2018-08-06 DIAGNOSIS — E785 Hyperlipidemia, unspecified: Secondary | ICD-10-CM | POA: Diagnosis present

## 2018-08-06 DIAGNOSIS — Z9889 Other specified postprocedural states: Secondary | ICD-10-CM | POA: Diagnosis not present

## 2018-08-06 DIAGNOSIS — K567 Ileus, unspecified: Secondary | ICD-10-CM | POA: Diagnosis present

## 2018-08-06 DIAGNOSIS — Z886 Allergy status to analgesic agent status: Secondary | ICD-10-CM

## 2018-08-06 DIAGNOSIS — Z882 Allergy status to sulfonamides status: Secondary | ICD-10-CM

## 2018-08-06 DIAGNOSIS — Z8249 Family history of ischemic heart disease and other diseases of the circulatory system: Secondary | ICD-10-CM | POA: Diagnosis not present

## 2018-08-06 DIAGNOSIS — Z79811 Long term (current) use of aromatase inhibitors: Secondary | ICD-10-CM

## 2018-08-06 DIAGNOSIS — Z9049 Acquired absence of other specified parts of digestive tract: Secondary | ICD-10-CM

## 2018-08-06 DIAGNOSIS — M069 Rheumatoid arthritis, unspecified: Secondary | ICD-10-CM

## 2018-08-06 DIAGNOSIS — E876 Hypokalemia: Secondary | ICD-10-CM | POA: Diagnosis present

## 2018-08-06 DIAGNOSIS — Z853 Personal history of malignant neoplasm of breast: Secondary | ICD-10-CM

## 2018-08-06 DIAGNOSIS — Z87891 Personal history of nicotine dependence: Secondary | ICD-10-CM | POA: Diagnosis not present

## 2018-08-06 DIAGNOSIS — F329 Major depressive disorder, single episode, unspecified: Secondary | ICD-10-CM | POA: Diagnosis present

## 2018-08-06 DIAGNOSIS — Z7984 Long term (current) use of oral hypoglycemic drugs: Secondary | ICD-10-CM

## 2018-08-06 DIAGNOSIS — I714 Abdominal aortic aneurysm, without rupture: Secondary | ICD-10-CM | POA: Diagnosis present

## 2018-08-06 DIAGNOSIS — Z7901 Long term (current) use of anticoagulants: Secondary | ICD-10-CM

## 2018-08-06 DIAGNOSIS — I480 Paroxysmal atrial fibrillation: Secondary | ICD-10-CM

## 2018-08-06 DIAGNOSIS — E119 Type 2 diabetes mellitus without complications: Secondary | ICD-10-CM | POA: Diagnosis present

## 2018-08-06 DIAGNOSIS — F411 Generalized anxiety disorder: Secondary | ICD-10-CM

## 2018-08-06 DIAGNOSIS — E871 Hypo-osmolality and hyponatremia: Secondary | ICD-10-CM | POA: Diagnosis present

## 2018-08-06 DIAGNOSIS — Z933 Colostomy status: Secondary | ICD-10-CM

## 2018-08-06 DIAGNOSIS — Z833 Family history of diabetes mellitus: Secondary | ICD-10-CM

## 2018-08-06 DIAGNOSIS — Z91040 Latex allergy status: Secondary | ICD-10-CM

## 2018-08-06 LAB — BASIC METABOLIC PANEL
Anion gap: 6 (ref 5–15)
BUN: 7 mg/dL — ABNORMAL LOW (ref 8–23)
CO2: 20 mmol/L — ABNORMAL LOW (ref 22–32)
Calcium: 8.5 mg/dL — ABNORMAL LOW (ref 8.9–10.3)
Chloride: 102 mmol/L (ref 98–111)
GFR calc non Af Amer: >60 mL/min (ref >60)
Potassium: 4 mmol/L (ref 3.5–5.1)
Sodium: 128 mmol/L — ABNORMAL LOW (ref 135–145)

## 2018-08-06 LAB — BASIC METABOLIC PANEL WITH GFR
Creatinine, Ser: 0.46 mg/dL (ref 0.44–1.00)
GFR calc Af Amer: >60 mL/min (ref >60)
Glucose, Bld: 80 mg/dL (ref 70–99)

## 2018-08-06 LAB — PROTIME-INR
INR: 1.41
Prothrombin Time: 17.1 seconds — ABNORMAL HIGH (ref 11.4–15.2)

## 2018-08-06 LAB — CBC
HCT: 24.9 % — ABNORMAL LOW (ref 36.0–46.0)
Hemoglobin: 8.1 g/dL — ABNORMAL LOW (ref 12.0–15.0)
MCH: 27.7 pg (ref 26.0–34.0)
MCHC: 32.5 g/dL (ref 30.0–36.0)
MCV: 85.3 fL (ref 78.0–100.0)
Platelets: 329 10*3/uL (ref 150–400)
RBC: 2.92 MIL/uL — ABNORMAL LOW (ref 3.87–5.11)
RDW: 18.3 % — ABNORMAL HIGH (ref 11.5–15.5)
WBC: 5.8 10*3/uL (ref 4.0–10.5)

## 2018-08-06 LAB — HEPARIN LEVEL (UNFRACTIONATED): Heparin Unfractionated: 0.29 [IU]/mL — ABNORMAL LOW (ref 0.30–0.70)

## 2018-08-06 LAB — GLUCOSE, CAPILLARY
Glucose-Capillary: 68 mg/dL — ABNORMAL LOW (ref 70–99)
Glucose-Capillary: 149 mg/dL — ABNORMAL HIGH (ref 70–99)
Glucose-Capillary: 161 mg/dL — ABNORMAL HIGH (ref 70–99)
Glucose-Capillary: 135 mg/dL — ABNORMAL HIGH (ref 70–99)

## 2018-08-06 LAB — MAGNESIUM: Magnesium: 1.5 mg/dL — ABNORMAL LOW (ref 1.7–2.4)

## 2018-08-06 MED ORDER — HEPARIN (PORCINE) IN NACL 100-0.45 UNIT/ML-% IJ SOLN
1500.0000 [IU]/h | INTRAMUSCULAR | Status: DC
  Administered 2018-08-07 – 2018-08-08 (×2): 1550 [IU]/h via INTRAVENOUS
  Administered 2018-08-09 – 2018-08-10 (×2): 1600 [IU]/h via INTRAVENOUS
  Administered 2018-08-10: 1550 [IU]/h via INTRAVENOUS
  Administered 2018-08-11: 1500 [IU]/h via INTRAVENOUS
  Filled 2018-08-06 (×9): qty 250

## 2018-08-06 MED ORDER — WARFARIN SODIUM 5 MG PO TABS: 5.0000 mg | ORAL_TABLET | Freq: Once | ORAL | Status: DC

## 2018-08-06 MED ORDER — DILTIAZEM HCL ER COATED BEADS 180 MG PO CP24
360.0000 mg | ORAL_CAPSULE | Freq: Every day | ORAL | Status: DC
  Administered 2018-08-07 – 2018-08-23 (×16): 360 mg via ORAL
  Filled 2018-08-06 (×17): qty 2

## 2018-08-06 MED ORDER — DILTIAZEM HCL ER COATED BEADS 360 MG PO CP24: 360.0000 mg | ORAL_CAPSULE | Freq: Every day | ORAL | Status: DC

## 2018-08-06 MED ORDER — WARFARIN - PHARMACIST DOSING INPATIENT
Freq: Every day | Status: DC
  Administered 2018-08-07 – 2018-08-21 (×8)

## 2018-08-06 MED ORDER — ATORVASTATIN CALCIUM 40 MG PO TABS: 40.0000 mg | ORAL_TABLET | Freq: Every day | ORAL | 6 refills | Status: DC

## 2018-08-06 MED ORDER — ALPRAZOLAM 0.25 MG PO TABS
0.2500 mg | ORAL_TABLET | Freq: Three times a day (TID) | ORAL | Status: DC | PRN
  Administered 2018-08-06 – 2018-08-21 (×3): 0.25 mg via ORAL
  Filled 2018-08-06 (×4): qty 1

## 2018-08-06 MED ORDER — INSULIN ASPART 100 UNIT/ML ~~LOC~~ SOLN
0.0000 [IU] | Freq: Three times a day (TID) | SUBCUTANEOUS | Status: DC
  Administered 2018-08-06: 2 [IU] via SUBCUTANEOUS

## 2018-08-06 MED ORDER — ATORVASTATIN CALCIUM 40 MG PO TABS
40.0000 mg | ORAL_TABLET | Freq: Every day | ORAL | Status: DC
  Administered 2018-08-06 – 2018-08-22 (×17): 40 mg via ORAL
  Filled 2018-08-06 (×18): qty 1

## 2018-08-06 MED ORDER — DOFETILIDE 250 MCG PO CAPS
250.0000 ug | ORAL_CAPSULE | Freq: Two times a day (BID) | ORAL | Status: DC
  Administered 2018-08-06 – 2018-08-23 (×34): 250 ug via ORAL
  Filled 2018-08-06 (×35): qty 1

## 2018-08-06 MED ORDER — ENSURE ENLIVE PO LIQD
237.0000 mL | Freq: Two times a day (BID) | ORAL | Status: DC
  Administered 2018-08-07 – 2018-08-22 (×24): 237 mL via ORAL

## 2018-08-06 MED ORDER — WARFARIN SODIUM 5 MG PO TABS
5.0000 mg | ORAL_TABLET | Freq: Once | ORAL | Status: AC
  Administered 2018-08-06: 5 mg via ORAL
  Filled 2018-08-06: qty 1

## 2018-08-06 MED ORDER — ONDANSETRON HCL 4 MG PO TABS: 4.0000 mg | ORAL_TABLET | Freq: Four times a day (QID) | ORAL | Status: DC | PRN

## 2018-08-06 MED ORDER — HYDROXYCHLOROQUINE SULFATE 200 MG PO TABS
200.0000 mg | ORAL_TABLET | Freq: Two times a day (BID) | ORAL | Status: DC
  Administered 2018-08-06 – 2018-08-23 (×34): 200 mg via ORAL
  Filled 2018-08-06 (×36): qty 1

## 2018-08-06 MED ORDER — IPRATROPIUM-ALBUTEROL 0.5-2.5 (3) MG/3ML IN SOLN: 3.0000 mL | Freq: Four times a day (QID) | RESPIRATORY_TRACT | Status: DC | PRN

## 2018-08-06 MED ORDER — AMLODIPINE BESYLATE 2.5 MG PO TABS
2.5000 mg | ORAL_TABLET | Freq: Every day | ORAL | Status: DC
  Administered 2018-08-07 – 2018-08-23 (×17): 2.5 mg via ORAL
  Filled 2018-08-06 (×17): qty 1

## 2018-08-06 MED ORDER — TRAMADOL HCL 50 MG PO TABS
50.0000 mg | ORAL_TABLET | Freq: Four times a day (QID) | ORAL | Status: DC | PRN
  Administered 2018-08-06 – 2018-08-21 (×29): 50 mg via ORAL
  Filled 2018-08-06 (×32): qty 1

## 2018-08-06 MED ORDER — ONDANSETRON HCL 4 MG/2ML IJ SOLN: 4.0000 mg | Freq: Four times a day (QID) | INTRAMUSCULAR | Status: DC | PRN

## 2018-08-06 MED ORDER — FAMOTIDINE 20 MG PO TABS
20.0000 mg | ORAL_TABLET | Freq: Two times a day (BID) | ORAL | Status: DC
  Administered 2018-08-06 – 2018-08-23 (×34): 20 mg via ORAL
  Filled 2018-08-06 (×34): qty 1

## 2018-08-06 MED ORDER — POTASSIUM CHLORIDE CRYS ER 20 MEQ PO TBCR: 20.0000 meq | EXTENDED_RELEASE_TABLET | Freq: Every day | ORAL | Status: DC

## 2018-08-06 MED ORDER — MAGNESIUM SULFATE 4 GM/100ML IV SOLN
4.0000 g | Freq: Once | INTRAVENOUS | Status: AC
  Administered 2018-08-06: 4 g via INTRAVENOUS
  Filled 2018-08-06: qty 100

## 2018-08-06 NOTE — Consult Note (Signed)
[facility] CM Primary Care Navigator  08/06/2018  Melody Casey 03-23-1946 [id number]   Met with patient and daughter [name]- from [place]) at the bedside to identify possible discharge needs. Daughter reports that patient had severe left lower abdominal pain that resulted to this admission/ surgery. 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 Vonna Kotyk Dettinger with [facility] as the primary care provider.   Patient shared using [facility] [address] to obtain medications without any problem.   Patient reports managing her own medications at home using "pill box" system filled once a week.   Patient has been driving prior to admission/ surgery but husband [name]) will be providing transportation to her doctors' appointments after discharge.  Her husband will be the primary caregiver at home, per daughter.   Anticipated discharge plan is [facility]) per therapy recommendation. Daughter mentioned that patient may be discharged to a rehab facility if needs further rehabilitation coming from the Inpatient rehab.  Patient and daughter voiced understanding to call primary care provider's office once patient gets home, for a post discharge follow-up appointment within 1-2 weeks or sooner if needs arise. Patient letter (with PCP's contact number) was provided as a reminder.   Discussed with patientand daughter regarding [facility] CM services available for health managementand resourcesat homebut indicated having no needs or concerns at this time and states that health issues have been managed well so far. Patientand daughter verbalizedunderstandingof needto seekreferralfrom primary care provider to [facility] care management ifdeemed necessary and appropriate for any services in thefuture- once patientreturnsback home.  [facility] care management information  was provided for future needsthat patient may have.  Primary care provider's office is listed as providing transition of care (TOC) follow-up.   For additional questions please contact:  Edwena Felty A. Taylin Leder, BSN, RN-BC [facility] PRIMARY CARE Navigator Cell: [phone]

## 2018-08-06 NOTE — Progress Notes (Signed)
Melody Arn, MD  Physician  Physical Medicine and Rehabilitation  PMR Pre-admission  Addendum  Date of Service:  08/06/2018 2:12 PM       Related encounter: ED to Hosp-Admission (Discharged) from 07/24/2018 in [facility]         Show:Clear all [x] Manual[x] Template[x] Copied  Added by: [x] Cristina Gong, RN  [] Hover for details PMR Admission Coordinator Pre-Admission Assessment  Patient: Melody Casey is an 71 y.o., female MRN: [id number] DOB: [birth year] Height: 5\' 8"  (172.7 cm) Weight: 75.3 kg                                                                                                                                                  Insurance Information HMO:    PPO:      PCP:      IPA:      80/20:      OTHER: no HMO PRIMARY: Medicare a and b      Policy#: [id number]      Subscriber: pt Benefits:  Phone #: online     Name:  Eff. Date: 01/10/2009     Deduct: $1364      Out of Pocket Max: none      Life Max: none CIR: 100%      SNF: 20 full days Outpatient: 80%     Co-Pay: 20% Home Health: 100%      Co-Pay: none DME: 80%     Co-Pay: 20% Providers: pt choice  SECONDARY: none      Medicaid Application Date:       Case Manager:  Disability Application Date:       Case Worker:   Emergency Contact Information         Contact Information    Name Relation Home Work Mobile   Rogers,Loriann Daughter [phone]  [phone]   Myra Rude [phone]     Ermalinda Memos   [phone]     Current Medical History  Patient Admitting Diagnosis: debility related to perforated colon  History of Present Illness:HPI:Melody Casey is a 72 year old right-handed female with history of rheumatoid arthritis, CAD with non-STEMI, atrial fibrillation on chronic Coumadin as well as Tikosyn, infrarenal AAA, history of breast cancer as well as frequent recurrent sigmoid diverticulitis. Presented 07/24/2018 with persistent abdominal  pain x5 days left lower quadrant. CT scan showed extensive sigmoid diverticulitis with evidence of perforation containing air and stool. She has small focus of pneumoperitoneum. Noted INR upon admission of 3 and was reversed. 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 Donne Hazel. Hospital course pain management. Follow-up cardiology services for atrial fibrillation with RVR she did require pressors. Her chronic Coumadin has since been resumed. Lower extremity Dopplers negative. Acute blood loss anemia 8.1 and monitored. Hospital course with ileus diet has slowly been advanced.   Midline wound dressing changes  to be TID. Wound bed thought may be clean enough on Friday to place wound VAC.  Past Medical History      Past Medical History:  Diagnosis Date  . AAA (abdominal aortic aneurysm) ([place])   . Acute diverticulitis   . Anxiety   . Arthritis   . Atrial fibrillation ([place])    a. Dx 03/2011 - on tikosyn/coumadin.  Marland Kitchen CAD (coronary artery disease)    a. NSTEMI 02/2011: occ mid Cx, DES to OM2, residual nonobst LAD dz.  . Cancer ([place])    breast  . Diabetes mellitus   . Diverticulitis   . Diverticulitis of colon     2008, 04/2011, 12/2014, 08/2015  . 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 Hemangioma    liver  . HLD (hyperlipidemia)   . HTN (hypertension)   . Osteoporosis   . Tobacco abuse    stopped smoking 2012  . Ureteral obstruction    History of gross hematuria/right hydronephrosis 2/2 to uteropelvic junction obstruction, s/p cystoscopy in January 2007 with bilateral retrograde pyelography, right ureter arthroscopy, right ureteral stent placement, bladder biopsies, stent removal since then.  . Wears dentures    top    Family History  family history includes Cancer in her brother and father; Diabetes in her brother; Heart disease in her mother; Heart failure (age of onset: 79) in her mother; Hypertension in her  brother; Lung cancer (age of onset: 2) in her father.  Prior Rehab/Hospitalizations:  Has the patient had major surgery during 100 days prior to admission? no  Current Medications   Current Facility-Administered Medications:  .  0.9 %  sodium chloride infusion, , Intravenous, Continuous, Adhikari, Amrit, MD, Last Rate: 50 mL/hr at 08/06/18 1327 .  ALPRAZolam (XANAX) tablet 0.25 mg, 0.25 mg, Oral, TID PRN, Rush Farmer, MD, 0.25 mg at 07/31/18 1215 .  amLODipine (NORVASC) tablet 2.5 mg, 2.5 mg, Oral, Daily, Purohit, Shrey C, MD, 2.5 mg at 08/06/18 1023 .  atorvastatin (LIPITOR) tablet 40 mg, 40 mg, Oral, q1800, Purohit, Shrey C, MD, 40 mg at 08/05/18 1759 .  diltiazem (CARDIZEM CD) 24 hr capsule 360 mg, 360 mg, Oral, Daily, Purohit, Shrey C, MD, 360 mg at 08/06/18 1024 .  dofetilide (TIKOSYN) capsule 250 mcg, 250 mcg, Oral, BID, Rush Farmer, MD, 250 mcg at 08/06/18 1326 .  famotidine (PEPCID) tablet 20 mg, 20 mg, Oral, BID, Dana Allan I, MD, 20 mg at 08/06/18 1022 .  feeding supplement (ENSURE ENLIVE) (ENSURE ENLIVE) liquid 237 mL, 237 mL, Oral, BID BM, Simaan, Elizabeth S, PA-C, 237 mL at 08/06/18 1032 .  fentaNYL (SUBLIMAZE) injection 25-50 mcg, 25-50 mcg, Intravenous, Q1H PRN, Rush Farmer, MD, 50 mcg at 08/06/18 1014 .  heparin ADULT infusion 100 units/mL (25000 units/2106mL sodium chloride 0.45%), 1,550 Units/hr, Intravenous, Continuous, Alvira Philips, [place], Last Rate: 15.5 mL/hr at 08/06/18 1017, 1,550 Units/hr at 08/06/18 1017 .  hydroxychloroquine (PLAQUENIL) tablet 200 mg, 200 mg, Oral, BID, Purohit, Shrey C, MD, 200 mg at 08/06/18 1023 .  insulin aspart (novoLOG) injection 0-9 Units, 0-9 Units, Subcutaneous, TID WC, Simaan, Elizabeth S, PA-C .  ipratropium-albuterol (DUONEB) 0.5-2.5 (3) MG/3ML nebulizer solution 3 mL, 3 mL, Nebulization, Q6H PRN, Rush Farmer, MD .  MEDLINE mouth rinse, 15 mL, Mouth Rinse, BID, Ramaswamy, Murali, MD, 15 mL at 08/06/18 1024 .   metoprolol tartrate (LOPRESSOR) injection 2.5 mg, 2.5 mg, Intravenous, Q3H PRN, Whiteheart, Kathryn A, NP, 2.5 mg at 07/26/18 1419 .  ondansetron (ZOFRAN) injection 4 mg, 4 mg, Intravenous, Q4H PRN, Saverio Danker, PA-C, 4 mg at 08/01/18 0603 .  traMADol (ULTRAM) tablet 50 mg, 50 mg, Oral, Q6H PRN, Jill Alexanders, PA-C, 50 mg at 08/05/18 1259 .  warfarin (COUMADIN) tablet 5 mg, 5 mg, Oral, ONCE-1800, Alvira Philips, [place] .  Warfarin - Pharmacist Dosing Inpatient, , Does not apply, q1800, Purohit, Konrad Dolores, MD  Patients Current Diet:     Diet Order                  Diet - low sodium heart healthy         DIET SOFT Room service appropriate? Yes; Fluid consistency: Thin  Diet effective now               Precautions / Restrictions Precautions Precautions: Fall Precaution Comments: Colostomy Restrictions Weight Bearing Restrictions: No   Has the patient had 2 or more falls or a fall with injury in the past year?No  Prior Activity Level Community (5-7x/wk): independent  Development worker, international aid / Equipment Home Assistive Devices/Equipment: CBG Meter, Environmental consultant (specify type) Home Equipment: Walker - 2 wheels  Prior Device Use: Indicate devices/aids used by the patient prior to current illness, exacerbation or injury? None of the above  Prior Functional Level Prior Function Level of Independence: Independent Comments: [place]jo[place] walking[place]  Self Care: Did the patient need help bathing, dressing, using the toilet or eating?  Independent  Indoor Mobility: Did the patient need assistance with walking from room to room (with or without device)? Independent  Stairs: Did the patient need assistance with internal or external stairs (with or without device)? Independent  Functional Cognition: Did the patient need help planning regular tasks such as shopping or remembering to take medications? Independent  Current Functional Level Cognition   Overall Cognitive Status: No family/caregiver present to determine baseline cognitive functioning Orientation Level: Oriented X4 Following Commands: Follows one step commands with increased time General Comments: Pt presenting with self limiting behavior and poor safety awareness. Pt requiring cues and encouragment throughout session    Extremity Assessment (includes Sensation/Coordination)  Upper Extremity Assessment: Generalized weakness  Lower Extremity Assessment: LLE deficits/detail, RLE deficits/detail, Defer to PT evaluation RLE Deficits / Details: RLE atrophy (pt reports baseline and does not give her issue); R calf pain/tenderness with palpation (RN aware and to have MD assess again for DVT/etc.) LLE Deficits / Details: Edema noted LLE: Unable to fully assess due to pain LLE Coordination: decreased fine motor, decreased gross motor    ADLs  Overall ADL's : Needs assistance/impaired Eating/Feeding: Set up, Sitting Eating/Feeding Details (indicate cue type and reason): drinking water Grooming: Wash/dry face, Oral care, Minimal assistance, Sitting, Moderate assistance(+2) Grooming Details (indicate cue type and reason): Pt performing oral care while sitting at EOB. Pt requiring Min A for upright posture due to pt leaning backwards with pain. Increasing to Mod A as pt fatigued.  Upper Body Bathing: Moderate assistance, Sitting Lower Body Bathing: +2 for physical assistance, Sit to/from stand, Total assistance Upper Body Dressing : Moderate assistance, Sitting Lower Body Dressing: +2 for physical assistance, Sit to/from stand, Total assistance Toilet Transfer: +2 for physical assistance, Moderate assistance, Stand-pivot, BSC, RW Toilet Transfer Details (indicate cue type and reason): bed pan mod (A)> pt (A) with BIL LE positioning and able to elevated buttock for bed pan placement . void of bladder and total (A) for hygiene. pt able to elevated again with A to maintain knee  flexion by  holding L LE ankle Toileting- Clothing Manipulation and Hygiene: Total assistance, +2 for physical assistance, Sit to/from stand General ADL Comments: Pt agreeable to perform oral care at EOB. Pt with self limiting behavior    Mobility  Overal bed mobility: Needs Assistance Bed Mobility: Rolling, Sidelying to Sit Rolling: Min assist Sidelying to sit: Mod assist Sit to sidelying: Max assist, +2 for physical assistance General bed mobility comments: assistance required to bring hips to EOB and to elevate trunk into sitting    Transfers  Overall transfer level: Needs assistance Equipment used: Rolling walker (2 wheeled) Transfer via Lift Equipment: Stedy Transfers: Sit to/from Stand Sit to Stand: +2 physical assistance, Max assist Stand pivot transfers: +2 physical assistance, Mod assist General transfer comment: assistance required to power up into standing and multimodal cues for posture; assistance to guide RW when pivoting bed to [facility]; recliner brought up behind pt after use of BSC; cues for hand placement and technique    Ambulation / Gait / Stairs / Wheelchair Mobility  Ambulation/Gait Ambulation/Gait assistance: Max assist, +2 physical assistance, +2 safety/equipment Gait Distance (Feet): 8 Feet Assistive device: Rolling walker (2 wheeled) Gait Pattern/deviations: Step-to pattern, Antalgic, Trunk flexed General Gait Details: after stand pivot to [facility] pt felt too fatigued to attempt gait  Gait velocity: decreased  Gait velocity interpretation: <1.31 ft/sec, indicative of household ambulator    Posture / Balance Dynamic Sitting Balance Sitting balance - Comments: Reliant on Min A for upright posture Balance Overall balance assessment: Needs assistance Sitting-balance support: Bilateral upper extremity supported, Feet supported Sitting balance-Leahy Scale: Poor Sitting balance - Comments: Reliant on Min A for upright posture Postural control: Posterior lean Standing  balance support: Bilateral upper extremity supported Standing balance-Leahy Scale: Poor Standing balance comment: modA +2 requires B UE on RW and external support     Special needs/care consideration BiPAP/CPAP  N/a CPM n/a Continuous Drip IV n/a Dialysis n/a Life Vest n/a Oxygen n/a Special Bed n/a Trach Size n/a Wound Vac n/a. May be placed on 8/30 Skin     Melody R, RN  Registered Nurse  WOC  Consult Note   Signed   Date of Service:  08/05/2018 10:38 AM            Signed         [facility] Nurse ostomy follow up Stoma type/location:LLQ, end stoma Stomal assessment/size:2 1/4" round, budded, edematous Peristomal assessment:intact, tenderness noted between stoma and wound, skin is intact Treatment options for stomal/peristomal skin:used 1/2 2" barrier ring between the stoma and the midline open wound Outputdark, green, some formed Ostomy pouching: 1pc.flat, changed to 1pc for flexibility. 2pc pouch has been leaking at [place] every day and leaking into the wound bed. This area has a dip in the abdominal topography and is very near to the midline wound.  Education provided: Explained role of ostomy nurse and creation of stoma  Explained stoma characteristics (budded, flush, color, texture, care) Demonstrated pouch change (cutting new skin barrier, measuring stoma, cleaning peristomal skin and stoma, use of barrier ring) Education on emptying when 1/3 to 1/2 full and how to empty Demonstrated use of wick to clean spout  Husband was to meet [place] at [place] for teaching session. Patient reports he's not going to be here. "he is getting tires on the back of the truck". We planned another session with her husband who she desires to learn as well on Thursday. Patient has no other family here in town. Will need  HHRN for assistance at home after DC. May DC to inpatient rehab per notes in the chart.    Enrolled patient in [facility] Start Discharge program:  Yes   [place] Nurse wound follow up Wound type:midline incision, open Measurement:19cm x 6.5cm x 8.5 at distal end Wound WPY:KDXIP, moist and pink Drainage (amount, consistency, odor)moderate, unable to assess consistency due to stool contamination in the wound bed Peri wound:intact Dressing procedure/placement/frequency: Saline moist gauze, cover with dry dressing, secure with tape.   May benefit from NPWT dressing, would require to be changed with ostomy pouch each time due to proximity of wound to the stoma. Would suggest placement tomorrow for M/W/F schedule.  [facility] nurse team will follow along for support with ostomy care and wound care as needed.  Melody [facility], CNS, CWON-AP [phone]    Bowel mgmt:  New colostomy this admit Bladder mgmt: external catheter Diabetic mgmt yes   Previous Home Environment Living Arrangements: Spouse/significant other  Lives With: Spouse Available Help at Discharge: Family, Available 24 hours/day Type of Home: House Home Layout: One level Home Access: Stairs to enter Entrance Stairs-Rails: Right, Left, Can reach both Entrance Stairs-Number of Steps: 3 Bathroom Shower/Tub: Multimedia programmer: Handicapped height Bathroom Accessibility: Yes How Accessible: Accessible via walker [place]: No Additional Comments: 3 children in [place]  Discharge Living Setting Plans for Discharge Living Setting: Patient's home, Lives with (comment)(spouse) Type of Home at Discharge: House Discharge Home Layout: One level Discharge Home Access: Stairs to enter Entrance Stairs-Rails: Right, Left, Can reach both Entrance Stairs-Number of Steps: 3 Discharge Bathroom Shower/Tub: Walk-in shower Discharge Bathroom Toilet: Handicapped height Discharge Bathroom Accessibility: Yes How Accessible: Accessible via walker Does the patient have any problems obtaining your medications?: No  Social/Family/Support  Systems Patient Roles: Spouse, Parent Contact Information: [name], daughter lives in [place] Anticipated Caregiver: spouse, [name] may returen to [place] to asisst if available Anticipated Caregiver's Contact Information: [phone] Ability/Limitations of Caregiver: Live in [place] Caregiver Availability: 24/7 Discharge Plan Discussed with Primary Caregiver: Yes Is Caregiver In Agreement with Plan?: Yes Does Caregiver/Family have Issues with Lodging/Transportation while Pt is in Rehab?: No  Daughter, [name], currently here at bedside until she returns to [facility] 08/09/18. She may return to assist at d/c.  Goals/Additional Needs Patient/Family Goal for Rehab: supervision to min asisst with PT and OT Expected length of stay: [facility] 14 to 20 days Special Service Needs: will likely d/c home with wound VAC Pt/Family Agrees to Admission and willing to participate: Yes Program Orientation Provided & Reviewed with Pt/Caregiver Including Roles  & Responsibilities: Yes  Decrease burden of Care through IP rehab admission: n/a  Possible need for SNF placement upon discharge: possible if pt functionally does not reach supervision to min assist level to d/c home or if complicated wound care issues need further care before d/c home  Patient Condition: This patient's medical and functional status has changed since the consult dated: 07/31/2018 in which the Rehabilitation Physician determined and documented that the patient's condition is appropriate for intensive rehabilitative care in an inpatient rehabilitation facility. See "History of Present Illness" (above) for medical update. Functional changes are: overall mod to max assist. Patient's medical and functional status update has been discussed with the Rehabilitation physician and patient remains appropriate for inpatient rehabilitation. Will admit to inpatient rehab today.  Preadmission Screen Completed By:  Cleatrice Burke, 08/06/2018 2:13  PM ______________________________________________________________________   Discussed status with Dr. Posey Pronto on 08/06/2018 at  1422 and received telephone  approval for admission today.  Admission Coordinator:  Cleatrice Burke, time 1410 Date 08/06/2018       Revision History

## 2018-08-06 NOTE — Progress Notes (Addendum)
PROGRESS NOTE    Melody Casey  JQB:[id number] DOB: 15-Dec-1945 DOA: 07/24/2018 PCP: Dettinger, [name], MD   Brief Narrative: Patient is a 72 year old female with past medical history of recurrent diverticular disease, anxiety/depression, intracranial aortic aneurysm, chronic atrial fibrillation on warfarin, history of nephrolithiasis, breast cancer on remission on anastrozole, coronary disease, hypertension, diabetes type 2, hyperlipidemia who was admitted on 07/24/2018 with complaints of abdominal pain and found to have fecal peritonitis due to perforated diverticulum .She  is currently  status post left colectomy,  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  Her postoperative stay was completed by episodes of hypertension and A. fib with RVR.  Currently she is hemodynamically stable and is stable for discharge to CIR.  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  General surgery following. Completed antibiotics course.  Continue pain management.  Right lower quadrant JP drain discontinued on 08/03/18.  Continue colostomy care.  Chronic A. fib with RVR: Currently heart rate is controlled.  Continue Cardizem, dofetilide.  Was on heparin drip.  Warfarin restarted.  Hypertension/hyperlipidemia/coronary artery disease: Continue current medications.  On amlodipine, Lipitor, lisinopril, Cardizem. Status post DES to OM 2 in 2012  Type 2 diabetes mellitus: Currently on sliding scale insulin.  History of breast cancer: Currently in  remission.  Continue anastrozole.  History of scleroderma: Continue hydroxychloroquine 200 mg.  Hypomagnesemia: Supplemented  Hyponatremia:Na is 128 today.Will continue gentle IV fluids    DVT prophylaxis: Warfarin Code Status: Full Family Communication: None present at the bed side Disposition Plan: CIR as soon as the bed is available   Consultants: General surgery  Procedures: Colectomy, placement of colostomy  Antimicrobials: None  Subjective: Patient seen and examined at bedside this morning.  Currently no complaints.  Hemodynamically stable and comfortable.  Objective: Vitals:   08/05/18 0446 08/05/18 1318 08/05/18 2233 08/06/18 0549  BP: (!) 145/71 (!) 121/58 117/72 (!) 119/55  Pulse: 91 88 92 86  Resp: 17 14 18 17   Temp: 97.7 F (36.5 C) 98.3 F (36.8 C) (!) 97.5 F (36.4 C) (!) 97.5 F (36.4 C)  TempSrc: Oral  Oral   SpO2: 96% 95% 96% 98%  Weight:      Height:        Intake/Output Summary (Last 24 hours) at 08/06/2018 1114 Last data filed at 08/06/2018 1000 Gross per 24 hour  Intake 811 ml  Output 550 ml  Net 261 ml   Filed Weights   07/26/18 1311 07/29/18 0600 08/04/18 1706  Weight: 75.8 kg 81.7 kg 75.3 kg    Examination:  General exam: Appears calm and comfortable ,Not in distress,average built HEENT:PERRL,Oral mucosa moist, Ear/Nose normal on gross exam Respiratory system: Bilateral equal air entry, normal vesicular breath sounds, no wheezes or crackles  Cardiovascular system: S1 & S2 heard, RRR. No JVD, murmurs, rubs, gallops or clicks. No pedal edema. Gastrointestinal system: Abdomen is nondistended, soft and nontender. No organomegaly or masses felt. Normal bowel sounds heard.  Clean surgical scar.  Colostomy Central nervous system: Alert and oriented. No focal neurological deficits. Extremities: No edema, no  clubbing ,no cyanosis, distal peripheral pulses palpable. Skin: No rashes, lesions or ulcers,no icterus ,no pallor MSK: Normal muscle  bulk,tone ,power Psychiatry: Judgement and insight appear normal. Mood & affect appropriate.     Data Reviewed: I have personally reviewed following labs and imaging studies  CBC: Recent Labs  Lab 08/01/18 0200 08/02/18 0227 08/03/18 0353 08/04/18 0224 08/06/18 0330  WBC 9.4 9.6 8.2 7.8 5.8  HGB 8.9* 8.5* 8.8* 9.1* 8.1*  HCT 27.5* 26.9* 27.1* 26.7* 24.9*  MCV 85.7 87.3 85.8 86.1 85.3  PLT 216 252 251 401* 737   Basic Metabolic Panel: Recent Labs  Lab 07/30/18 1514  07/31/18 0934 08/01/18 0200 08/02/18 0227 08/03/18 0353 08/03/18 1338 08/04/18 0224 08/05/18 0546 08/06/18 0330  NA 134*  --  131* 130* 128* 127*  --  126* 129* 128*  K 3.9  --  3.4* 3.8 4.0 3.9  --  4.1 3.8 4.0  CL 100  --  100 101 100 100  --  98 102 102  CO2 27  --  26 25 23  20*  --  22 21* 20*  GLUCOSE 165*  --  119* 96 86 93  --  103* 103* 80  BUN 16  --  14 10 8  6*  --  7* 7* 7*  CREATININE 0.57  --  0.46 0.43* 0.45 0.42*  --  0.48 0.39* 0.46  CALCIUM 8.3*  --  8.4* 8.2* 8.3* 8.1*  --  8.3* 8.4* 8.5*  MG  --    < > 1.6* 1.6* 1.7 1.6* 2.2 1.7 1.8 1.5*  PHOS 2.9  --  2.3*  2.4* 2.4* 2.5  2.5 2.5  2.4*  --   --   --   --    < > = values in this interval not displayed.   GFR: Estimated Creatinine Clearance: 62.2 mL/min (by C-G formula based on SCr of 0.46 mg/dL). Liver Function Tests: Recent Labs  Lab 07/30/18 1514 07/31/18 0934 08/01/18 0200 08/02/18 0227 08/03/18 0353  ALBUMIN 1.6* 1.6* 1.5* 1.5* 1.5*   No results for input(s): LIPASE, AMYLASE in the last 168 hours. No results for input(s): AMMONIA in the last 168 hours. Coagulation Profile: Recent Labs  Lab 08/05/18 0546 08/06/18 0330  INR 1.27 1.41   Cardiac Enzymes: No results for input(s): CKTOTAL, CKMB, CKMBINDEX, TROPONINI in the last 168 hours. BNP (last 3 results) No results for input(s): PROBNP in the last 8760 hours. HbA1C: No results for input(s): HGBA1C in the last 72 hours. CBG: Recent Labs  Lab 08/05/18 0758  08/05/18 1202 08/05/18 1703 08/05/18 2232 08/06/18 0811  GLUCAP 95 100* 99 83 68*   Lipid Profile: No results for input(s): CHOL, HDL, LDLCALC, TRIG, CHOLHDL, LDLDIRECT in the last 72 hours. Thyroid Function Tests: No results for input(s): TSH, T4TOTAL, FREET4, T3FREE, THYROIDAB in the last 72 hours. Anemia Panel: No results for input(s): VITAMINB12, FOLATE, FERRITIN, TIBC, IRON, RETICCTPCT in the last 72 hours. Sepsis Labs: No results for input(s): PROCALCITON, LATICACIDVEN in the last 168 hours.  No results found for this or any previous visit (from the past 240 hour(s)).       Radiology Studies: No results found.      Scheduled Meds: . amLODipine  2.5 mg Oral Daily  . atorvastatin  40 mg Oral q1800  . diltiazem  360 mg Oral Daily  . dofetilide  250 mcg Oral BID  . famotidine  20 mg Oral BID  . feeding supplement (ENSURE ENLIVE)  237 mL  Oral BID BM  . hydroxychloroquine  200 mg Oral BID  . insulin aspart  0-9 Units Subcutaneous TID WC  . mouth rinse  15 mL Mouth Rinse BID  . warfarin  5 mg Oral ONCE-1800  . Warfarin - Pharmacist Dosing Inpatient   Does not apply q1800   Continuous Infusions: . sodium chloride 10 mL/hr at 08/02/18 0400  . heparin 1,550 Units/hr (08/06/18 1017)  . magnesium sulfate 1 - 4 g bolus IVPB 4 g (08/06/18 1020)     LOS: 13 days    Time spent: 25 mins.More than 50% of that time was spent in counseling and/or coordination of care.      Shelly Coss, MD [facility] Pager [phone]  If 7PM-7AM, please contact night-coverage [email or web address] Password [email or web address] 08/06/2018, 11:14 AM

## 2018-08-06 NOTE — Progress Notes (Addendum)
[facility] Progress Note  12 Days Post-Op  Subjective: CC:  Tolerating PO, having colostomy output.   Objective: Vital signs in last 24 hours: Temp:  [97.5 F (36.4 C)-98.3 F (36.8 C)] 97.5 F (36.4 C) (08/28 0549) Pulse Rate:  [86-92] 86 (08/28 0549) Resp:  [14-18] 17 (08/28 0549) BP: (117-121)/(55-72) 119/55 (08/28 0549) SpO2:  [95 %-98 %] 98 % (08/28 0549) Last BM Date: 08/05/18  Intake/Output from previous day: 08/27 0701 - 08/28 0700 In: 1031 [P.O.:780; I.V.:251] Out: 600 [Urine:500; Stool:100] Intake/Output this shift: Total I/O In: 200 [P.O.:200] Out: -   PE: Gen:  Alert, NAD, pleasant Abd: Soft, incisional tenderness, stoma viable, midline incision with wound edges granulating nicely. Would base inferior 60% of incision with tan slough.  Skin: warm and dry, no rashes  Psych: A&Ox3   Lab Results:  Recent Labs    08/04/18 0224 08/06/18 0330  WBC 7.8 5.8  HGB 9.1* 8.1*  HCT 26.7* 24.9*  PLT 401* 329   BMET Recent Labs    08/05/18 0546 08/06/18 0330  NA 129* 128*  K 3.8 4.0  CL 102 102  CO2 21* 20*  GLUCOSE 103* 80  BUN 7* 7*  CREATININE 0.39* 0.46  CALCIUM 8.4* 8.5*   PT/INR Recent Labs    08/05/18 0546 08/06/18 0330  LABPROT 15.8* 17.1*  INR 1.27 1.41   CMP     Component Value Date/Time   NA 128 (L) 08/06/2018 0330   NA 132 (L) 07/22/2018 1632   NA 134 (L) 06/06/2017 1259   K 4.0 08/06/2018 0330   K 4.5 06/06/2017 1259   CL 102 08/06/2018 0330   CO2 20 (L) 08/06/2018 0330   CO2 24 06/06/2017 1259   GLUCOSE 80 08/06/2018 0330   GLUCOSE 311 (H) 06/06/2017 1259   BUN 7 (L) 08/06/2018 0330   BUN 32 (H) 07/22/2018 1632   BUN 18.3 06/06/2017 1259   CREATININE 0.46 08/06/2018 0330   CREATININE 1.0 06/06/2017 1259   CALCIUM 8.5 (L) 08/06/2018 0330   CALCIUM 10.6 (H) 06/06/2017 1259   PROT 4.7 (L) 07/26/2018 0311   PROT 6.5 07/22/2018 1632   PROT 7.7 06/06/2017 1259   ALBUMIN 1.5 (L) 08/03/2018 0353   ALBUMIN 3.5  07/22/2018 1632   ALBUMIN 3.3 (L) 06/06/2017 1259   AST 26 07/26/2018 0311   AST 17 06/06/2017 1259   ALT 15 07/26/2018 0311   ALT 14 06/06/2017 1259   ALKPHOS 66 07/26/2018 0311   ALKPHOS 115 06/06/2017 1259   BILITOT 3.1 (H) 07/26/2018 0311   BILITOT 0.9 07/22/2018 1632   BILITOT 0.52 06/06/2017 1259   GFRNONAA >60 08/06/2018 0330   GFRNONAA 80 06/22/2015 1614   GFRAA >60 08/06/2018 0330   GFRAA >89 06/22/2015 1614   Lipase     Component Value Date/Time   LIPASE 24 07/24/2018 0944       Studies/Results: No results found.  Anti-infectives: Anti-infectives (From admission, onward)   Start     Dose/Rate Route Frequency Ordered Stop   08/03/18 1000  hydroxychloroquine (PLAQUENIL) tablet 200 mg     200 mg Oral 2 times daily 08/03/18 0727     07/24/18 1800  piperacillin-tazobactam (ZOSYN) IVPB 3.375 g     3.375 g 12.5 mL/hr over 240 Minutes Intravenous Every 8 hours 07/24/18 1725 07/31/18 1900   07/24/18 1045  cefTRIAXone (ROCEPHIN) 2 g in sodium chloride 0.9 % 100 mL IVPB  Status:  Discontinued     2 g 200  mL/hr over 30 Minutes Intravenous Every 24 hours 07/24/18 1044 07/24/18 1722   07/24/18 1045  metroNIDAZOLE (FLAGYL) IVPB 500 mg  Status:  Discontinued     500 mg 100 mL/hr over 60 Minutes Intravenous Every 8 hours 07/24/18 1044 07/24/18 1722     Assessment/Plan Principal Problem: Sepsis ([place]) Active Problems: Type 2 diabetes mellitus with complication, without long-term current use of insulin (HCC) CAD (coronary artery disease) Anxiety and depression AAA (abdominal aortic aneurysm) without rupture (HCC) Microcytic anemia Diverticulosis Acute diverticulitis of intestine Bowel perforation (HCC) Hyponatremia  Bowel perforation POD#12S/P [facility]'s (left colectomy, end transverse colostomy), Dr. Donne Hazel, 08/16 -bowel function returned -soft diet, continueensure -JP removed 8/25 - PO pain control - PT/OT, patient needs to  [facility]  KPV:VZSM, ensure VTE: SCD's, lovenox OL:MBEML 08/15-08/21 Follow up:Dr. Donne Hazel  Plan:increase midline wound dressing changes to TID. See if it is clean enough for a VAC on Friday. This should not delay admission to rehab, if she is medically stable for CIR and gets accepted we can place VAC while she is in CIR.   LOS: 13 days    Obie Dredge, [facility] Pager: [phone]

## 2018-08-06 NOTE — Progress Notes (Signed)
Melody Staggers, MD    Melody Staggers, MD  Physician  Physical Medicine and Rehabilitation      Consult Note  Signed     Date of Service:  07/31/2018  8:49 AM         Related encounter: ED to Hosp-Admission (Discharged) from 07/24/2018 in [facility]             Signed          Expand All Collapse All            Expand widget buttonCollapse widget button    Show:Clear all   ManualTemplateCopied  Added by:     Angiulli, Lavon Paganini, PA-C  Melody Staggers, MD   Hover for detailscustomization button                                                                                                                                               untitled image              Physical Medicine and Rehabilitation Consult  Reason for Consult: Decreased functional mobility  Referring Physician: Internal medicine        HPI: Melody Casey is a 72 y.o. right-handed female with history of Rheumatoid arthritis CAD/non-STEMI, atrial fibrillation on chronic Coumadin and Tikosyn, infrarenal AAA, history of breast cancer as well as frequent recurrent sigmoid diverticulitis.  Per chart review patient lives with spouse.  Reported to be independent and active prior to admission.  One level home with 3 steps to entry.  Presented 07/24/2018 with persistent abdominal pain x5 days left lower quadrant.  A CT scan showed extensive sigmoid diverticulitis with evidence of perforation containing air and stool.  She had a small focus of pneumoperitoneum.  Noted INR upon admission of 3.0 and reversed.  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 Donne Hazel.  Hospital course pain management.  Follow-up cardiology services for history of A. fib with RVR  she did require pressors.  Patient currently remains off chronic Coumadin.  IV heparin has been initiated.  Acute blood loss anemia 9.4.  Therapy evaluation completed with recommendations of physical medicine rehab consult.        Review of Systems   Constitutional: Negative for chills and fever.   HENT: Negative for hearing loss.    Eyes: Negative for blurred vision and double vision.   Respiratory: Negative for cough and shortness of breath.    Cardiovascular: Positive for palpitations.   Gastrointestinal: Positive for abdominal pain and nausea.   Genitourinary: Negative for dysuria, flank pain and hematuria.   Musculoskeletal: Positive for myalgias.   Skin: Negative for rash.   Psychiatric/Behavioral:        Anxiety   All  other systems reviewed and are negative.          Past Medical History:    Diagnosis   Date    .   AAA (abdominal aortic aneurysm) ([place])        .   Acute diverticulitis        .   Anxiety        .   Arthritis        .   Atrial fibrillation ([place])            a. Dx 03/2011 - on tikosyn/coumadin.    Marland Kitchen   CAD (coronary artery disease)            a. NSTEMI 02/2011: occ mid Cx, DES to OM2, residual nonobst LAD dz.    .   Cancer ([place])            breast    .   Diabetes mellitus        .   Diverticulitis        .   Diverticulitis of colon            2008, 04/2011, 12/2014, 08/2015    .   Foot deformity   1947        right; ??scleroderma    .   Hemangioma            liver    .   HLD (hyperlipidemia)        .   HTN (hypertension)        .   Osteoporosis        .   Tobacco abuse            stopped smoking 2012    .   Ureteral obstruction            History of gross hematuria/right hydronephrosis 2/2 to uteropelvic junction obstruction, s/p cystoscopy in January 2007 with bilateral retrograde pyelography, right ureter  arthroscopy, right ureteral stent placement, bladder biopsies, stent removal since then.    .   Wears dentures            top             Past Surgical History:    Procedure   Laterality   Date    .   BIOPSY   N/A   11/01/2015        Procedure: BIOPSY;  Surgeon: Danie Binder, MD;  Location: [facility];  Service: Endoscopy;  Laterality: N/A;    .   BREAST LUMPECTOMY WITH NEEDLE LOCALIZATION AND AXILLARY SENTINEL LYMPH NODE BX   Right   03/01/2014        Procedure: BREAST LUMPECTOMY WITH NEEDLE LOCALIZATION AND AXILLARY SENTINEL LYMPH NODE BX;  Surgeon: Edward Jolly, MD;  Location: [place];  Service: General;  Laterality: Right;    .   BREAST SURGERY            .   CARDIAC CATHETERIZATION       2012        stent    .   COLON RESECTION   N/A   07/25/2018        Procedure: COLOSTOMY;  Surgeon: Rolm Bookbinder, MD;  Location: [facility];  Service: General;  Laterality: N/A;    .   COLONOSCOPY WITH PROPOFOL   N/A   06/29/2014        Dr. Wynetta Emery: universal diverticulosis    .  CORONARY STENT PLACEMENT       2012    .   ESOPHAGOGASTRODUODENOSCOPY (EGD) WITH PROPOFOL   N/A   11/01/2015        Procedure: ESOPHAGOGASTRODUODENOSCOPY (EGD) WITH PROPOFOL;  Surgeon: Danie Binder, MD;  Location: [facility];  Service: Endoscopy;  Laterality: N/A;    .   KNEE ARTHROSCOPY                left    .   LAPAROTOMY   N/A   07/25/2018        Procedure: EXPLORATORY LAPAROTOMY;  Surgeon: Rolm Bookbinder, MD;  Location: [place];  Service: General;  Laterality: N/A;    .   PARTIAL COLECTOMY   N/A   07/25/2018        Procedure: COLECTOMY;  Surgeon: Rolm Bookbinder, MD;  Location: [place];  Service: General;  Laterality: N/A;    .   Right leg surgery       age 1        As a child for  ?scleroderma per patient    .   TONSILLECTOMY AND  ADENOIDECTOMY            .   TUBAL LIGATION            .   Ureteral surgery       2011        rt ureterostomy-stent             Family History    Problem   Relation   Age of Onset    .   Lung cancer   Father   12            deceased    .   Cancer   Father        .   Heart failure   Mother   59            deceased    .   Heart disease   Mother        .   Diabetes   Brother        .   Hypertension   Brother        .   Cancer   Brother                prostate    .   Colon cancer   Neg Hx        .   Liver disease   Neg Hx           Social History:  reports that she quit smoking about 7 years ago. Her smoking use included cigarettes. She has a 100.00 pack-year smoking history. She has never used smokeless tobacco. She reports that she does not drink alcohol or use drugs.  Allergies:         Allergies    Allergen   Reactions    .   Miralax [Polyethylene Glycol]   Swelling and Rash            Took [facility] brand developed rash. Patient states she tolerated name brand MiraLax in the past.     09/01/15. Patient states she had diffuse swelling including swelling of her lips.     .   Vancomycin   Rash and Shortness Of Breath    .   Acetaminophen   Hives    .   Oxycodone-Acetaminophen   Itching    .   Sulfa  Antibiotics   Rash            All over rash    .   Sulfacetamide Sodium   Rash            All over rash    .   Sulfasalazine   Rash            All over rash  All over rash    .   Banana   Other (See Comments)            Unknown    .   Latex   Rash    .   Tape   Rash              Medications Prior to Admission    Medication   Sig   Dispense   Refill    .   ALPRAZolam (XANAX) 0.25 MG tablet   Take 1 tablet (0.25 mg total) by mouth daily as needed for  anxiety.   30 tablet   1    .   amLODipine (NORVASC) 2.5 MG tablet   Take 1 tablet (2.5 mg total) by mouth daily. Please call to make appointment for further refills.   90 tablet   0    .   anastrozole (ARIMIDEX) 1 MG tablet   Take 1 tablet (1 mg total) by mouth daily.   90 tablet   3    .   atorvastatin (LIPITOR) 40 MG tablet   Take 1 1/2 tablets daily   45 tablet   6    .   ciprofloxacin (CIPRO) 500 MG tablet   Take 1 tablet (500 mg total) by mouth 2 (two) times daily for 10 days.   20 tablet   0    .   dofetilide (TIKOSYN) 250 MCG capsule   Take 1 capsule (250 mcg total) by mouth 2 (two) times daily.   180 capsule   3    .   hydroxychloroquine (PLAQUENIL) 200 MG tablet   Take by mouth 2 (two) times daily.            Marland Kitchen   KLOR-CON M20 20 MEQ tablet   TAKE 1 TABLET BY MOUTH TWICE DAILY (Patient taking differently: Take 20 mEq by mouth daily. )   180 tablet   1    .   linaclotide (LINZESS) 145 MCG CAPS capsule   Take 1 capsule (145 mcg total) by mouth daily before breakfast.   30 capsule   0    .   lisinopril (PRINIVIL,ZESTRIL) 40 MG tablet   TAKE 1 TABLET BY MOUTH IN THE MORNING   90 tablet   1    .   magnesium oxide (MAG-OX) 400 MG tablet   Take 1 tablet (400 mg total) by mouth daily. Take 400 mg twice a day with food (Patient taking differently: Take 400 mg by mouth daily. )            .   metFORMIN (GLUCOPHAGE) 500 MG tablet   TAKE 1 TABLET BY MOUTH ONCE DAILY   90 tablet   0    .   metroNIDAZOLE (FLAGYL) 500 MG tablet   Take 1 tablet (500 mg total) by mouth 2 (two) times daily for 10 days.   20 tablet   0    .   nitroGLYCERIN (NITROSTAT) 0.4 MG SL tablet   Place 1 tablet (0.4 mg total) under the tongue every 5 (five) minutes  as needed for chest pain.   25 tablet   5    .   warfarin (COUMADIN) 5 MG tablet   Take 1 tablet daily except on Thursdays take 1 and 1/2  tablets. (Patient taking differently: Take 5 mg by mouth daily. )   102 tablet   0    .   amoxicillin (AMOXIL) 500 MG capsule   Take 1 capsule (500 mg total) by mouth 3 (three) times daily. (Patient not taking: Reported on 07/24/2018)   21 capsule   0          Home:  Home Living  Family/patient expects to be discharged to:: Private residence  Living Arrangements: Spouse/significant other  Available Help at Discharge: Family, Available 24 hours/day  Type of Home: House  Home Access: Stairs to enter  [employer] of Steps: 3  Entrance Stairs-Rails: Right, Left, Can reach both  Home Layout: One level  Bathroom Shower/Tub: Tourist information centre manager: Handicapped height  Home Equipment: Environmental consultant - 2 wheels   Functional History:  Prior Function  Level of Independence: Independent  Comments[place]jo[place] walking[place]  Functional Status:   Mobility:  Bed Mobility  Overal bed mobility: Needs Assistance  Bed Mobility: Rolling, Sidelying to Sit  Rolling: Mod assist  Sidelying to sit: Mod assist  Sit to sidelying: Mod assist  General bed mobility comments: cues for log roll technique for comfort, guided LEs over EOB, assisted to raise trunk and position hips at EOB with bed pad, increased time  Transfers  Overall transfer level: Needs assistance  Equipment used: Rolling walker (2 wheeled)  Transfer via Lift Equipment: Stedy  Transfers: Sit to/from Stand  Sit to Stand: Mod assist, +2 physical assistance, +2 safety/equipment  Stand pivot transfers: +2 physical assistance, Mod assist  General transfer comment: ModA+2 to assist trunk elevation and cue hip extension; pt remaining flexed forward, limited by pain and nevery coming fully upright. Stood from bed and [facility]  Ambulation/Gait  Ambulation/Gait assistance: Mod assist, +2 physical assistance, +2 safety/equipment  Gait Distance (Feet): 3 Feet  Assistive device:  Rolling walker (2 wheeled)  Gait Pattern/deviations: Step-to pattern, Antalgic, Trunk flexed  General Gait Details: Took pivotal steps from bed to [facility] then 4x more steps forward with RW and modA+2; maintaining forward flexed posture (c/o pain and weakness) despite max cues to fully extend. C/o BLE feeling heavy  Gait velocity: Decreased  Gait velocity interpretation: <1.31 ft/sec, indicative of household ambulator       ADL:  ADL  Overall ADL's : Needs assistance/impaired  Eating/Feeding: Set up, Sitting  Eating/Feeding Details (indicate cue type and reason): drinking water  Grooming: Sitting, Moderate assistance  Upper Body Bathing: Moderate assistance, Sitting  Lower Body Bathing: +2 for physical assistance, Sit to/from stand, Total assistance  Upper Body Dressing : Moderate assistance, Sitting  Lower Body Dressing: +2 for physical assistance, Sit to/from stand, Total assistance  Toilet Transfer: +2 for physical assistance, Moderate assistance, Stand-pivot, BSC, RW  Toileting- Clothing Manipulation and Hygiene: Total assistance, +2 for physical assistance, Sit to/from stand  General ADL Comments: pt reporting her feet feel heavy, flexed posture in standing     Cognition:  Cognition  Overall Cognitive Status: Impaired/Different from baseline  Orientation Level: Oriented X4  Cognition  Arousal/Alertness: Awake/alert  Behavior During Therapy: Flat affect  Overall Cognitive Status: Impaired/Different from baseline  Area of Impairment: Problem solving  Following Commands: Follows one step commands with increased time  Problem Solving: Slow  processing, Decreased initiation, Requires verbal cues  General Comments: Pt with slower processing compared to initial evaluation     Blood pressure 108/74, pulse 73, temperature 99.6 F (37.6 C), temperature source Oral, resp. rate 14, height 5\' 8"  (1.727 m), weight 81.7 kg, SpO2 94 %.  Physical Exam   Vitals  reviewed.  Constitutional: She is oriented to person, place, and time.  Very frail appearing   HENT:   Head: Normocephalic and atraumatic.   Eyes: Pupils are equal, round, and reactive to light. EOM are normal.   Neck: Normal range of motion. Neck supple. No thyromegaly present.   Cardiovascular: Normal rate. Exam reveals no friction rub.   No murmur heard.     Respiratory: Effort normal and breath sounds normal. No respiratory distress. She has no wheezes.   GI: Bowel sounds are normal. She exhibits no distension.  colostomy  Musculoskeletal:  Right foot deformed, atrophic  Neurological: She is alert and oriented to person, place, and time. No cranial nerve deficit.   UE 4/5. LLE: 2-3/5 RLE: 1-2/5 prox to distal. Senses pain  Psychiatric:  Very flat, appears depressed         Lab Results Last 24 Hours  Imaging Results (Last 48 hours)             Assessment/Plan:  Diagnosis: Debility related to perforated sigmoid colon, multiple medical with history of RA  1.Does the need for close, 24 hr/day medical supervision in concert with the patient's rehab needs make it unreasonable for this patient to be served in a less intensive setting? Yes   2.Co-Morbidities requiring supervision/potential complications: nutrition, wound and ostomy care, pain mgt, mood   3.Due to bladder management, bowel management, safety, skin/wound care, disease management, medication administration, pain management and patient education, does the patient require 24 hr/day rehab nursing? Yes   4.Does the patient require coordinated care of a physician, rehab nurse, PT (1-2 hrs/day, 5 days/week) and OT (1-2 hrs/day, 5 days/week) to address physical and functional deficits in the context of the above medical diagnosis(es)? Yes Addressing deficits in the following areas: balance, endurance, locomotion, strength, transferring, bowel/bladder control, bathing, dressing, feeding, grooming, toileting and psychosocial support   5.Can the patient actively participate in an intensive therapy program of at least 3 hrs of therapy per day at least 5 days per week? Yes   6.The potential for patient to make measurable gains while on inpatient rehab is good   7.Anticipated functional outcomes upon discharge from inpatient rehab are supervision and min assist  with PT, supervision and min assist with OT, n/a with SLP.   8.Estimated rehab length of stay to reach the above functional goals is: 14-20 days   9.Anticipated D/C setting: Home   10.Anticipated post D/C treatments: [facility]   11.Overall Rehab/Functional Prognosis: excellent      RECOMMENDATIONS:  This patient's condition is appropriate for continued rehabilitative care in the following setting: CIR  Patient  has agreed to participate in recommended program. Yes  Note that insurance prior authorization may be required for reimbursement for recommended care.     Comment: Rehab Admissions Coordinator to follow up.     Thanks,     Melody Staggers, MD, Mellody Drown     I have personally performed a face to face diagnostic evaluation of this patient. Additionally, I have reviewed and concur with the physician assistant's documentation above.       Lavon Paganini Angiulli, PA-C  07/31/2018                Revision History                                        Routing History

## 2018-08-06 NOTE — Progress Notes (Signed)
Patient arrive to unit and oriented. No pain at this time, no questions. Resting comfortably with call bell in place. Ostomy recently emptied and abdominal wound dressing clean, dry and intact.

## 2018-08-06 NOTE — Progress Notes (Signed)
Inpatient Rehabilitation Admissions Coordinator  I met with patient and her daughter at bedside to discuss goals and expectations of an inpt rehab admission. Patient is in agreement. I contacted Dr. Tawanna Solo and he is in agreement to d/c today. RN CM made aware. I will make the arrangements to admit today.  Danne Baxter, RN, MSN Rehab Admissions Coordinator [phone] 08/06/2018 1:15 PM

## 2018-08-06 NOTE — Progress Notes (Signed)
CSW notes patient will be discharging to CIR.   CSW signing off. Please re-consult if needed.   Percell Locus Mehr Depaoli LCSW [phone]

## 2018-08-06 NOTE — Progress Notes (Signed)
Called report to CIR nurse. Pt currently has heparin gtt going at 15.5 and nurse aware. All belongings packed and pt/family informed regarding pt transfer to CIR. Pt stable at this time.

## 2018-08-06 NOTE — PMR Pre-admission (Addendum)
PMR Admission Coordinator Pre-Admission Assessment  Patient: Melody Casey is an 72 y.o., female MRN: [id number] DOB: Sep 12, 1946 Height: 5\' 8"  (172.7 cm) Weight: 75.3 kg              Insurance Information HMO:    PPO:      PCP:      IPA:      80/20:      OTHER: no HMO PRIMARY: Medicare a and b      Policy#: [id number]      Subscriber: pt Benefits:  Phone #: online     Name:  Eff. Date: 01/10/2009     Deduct: $1364      Out of Pocket Max: none      Life Max: none CIR: 100%      SNF: 20 full days Outpatient: 80%     Co-Pay: 20% Home Health: 100%      Co-Pay: none DME: 80%     Co-Pay: 20% Providers: pt choice  SECONDARY: none      Medicaid Application Date:       Case Manager:  Disability Application Date:       Case Worker:   Emergency Contact Information Contact Information    Name Relation Home Work Mobile   Rogers,Loriann Daughter [phone]  [phone]   Myra Rude [phone]     Ermalinda Memos   [phone]     Current Medical History  Patient Admitting Diagnosis: debility related to perforated colon  History of Present Illness:HPI: Melody Casey is a 72 year old right-handed female with history of rheumatoid arthritis, CAD with non-STEMI, atrial fibrillation on chronic Coumadin as well as Tikosyn, infrarenal AAA, history of breast cancer as well as frequent recurrent sigmoid diverticulitis.   Presented 07/24/2018 with persistent abdominal pain x5 days left lower quadrant.  CT scan showed extensive sigmoid diverticulitis with evidence of perforation containing air and stool.  She has small focus of pneumoperitoneum.  Noted INR upon admission of 3 and was reversed.  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 Donne Hazel.  Hospital course pain management.  Follow-up cardiology services for atrial fibrillation with RVR she did require pressors.  Her chronic Coumadin has since been resumed.  Lower extremity Dopplers negative.  Acute blood loss  anemia 8.1 and monitored.  Hospital course with ileus diet has slowly been advanced.    Midline wound dressing changes to be TID. Wound bed thought may be clean enough on Friday to place wound VAC.  Past Medical History  Past Medical History:  Diagnosis Date  . AAA (abdominal aortic aneurysm) ([place])   . Acute diverticulitis   . Anxiety   . Arthritis   . Atrial fibrillation ([place])    a. Dx 03/2011 - on tikosyn/coumadin.  [place] Kitchen CAD (coronary artery disease)    a. NSTEMI 02/2011: occ mid Cx, DES to OM2, residual nonobst LAD dz.  . Cancer ([place])    breast  . Diabetes mellitus   . Diverticulitis   . Diverticulitis of colon     2008, 04/2011, 12/2014, 08/2015  . 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 Hemangioma    liver  . HLD (hyperlipidemia)   . HTN (hypertension)   . Osteoporosis   . Tobacco abuse    stopped smoking 2012  . Ureteral obstruction    History of gross hematuria/right hydronephrosis 2/2 to uteropelvic junction obstruction, s/p cystoscopy in January 2007 with bilateral retrograde pyelography, right ureter arthroscopy, right ureteral stent placement, bladder biopsies, stent removal  since then.  . Wears dentures    top    Family History  family history includes Cancer in her brother and father; Diabetes in her brother; Heart disease in her mother; Heart failure (age of onset: 79) in her mother; Hypertension in her brother; Lung cancer (age of onset: 84) in her father.  Prior Rehab/Hospitalizations:  Has the patient had major surgery during 100 days prior to admission? no  Current Medications   Current Facility-Administered Medications:  .  0.9 %  sodium chloride infusion, , Intravenous, Continuous, Adhikari, Amrit, MD, Last Rate: 50 mL/hr at 08/06/18 1327 .  ALPRAZolam (XANAX) tablet 0.25 mg, 0.25 mg, Oral, TID PRN, Rush Farmer, MD, 0.25 mg at 07/31/18 1215 .  amLODipine (NORVASC) tablet 2.5 mg, 2.5 mg, Oral, Daily, Purohit, Shrey C, MD, 2.5 mg at 08/06/18 1023 .   atorvastatin (LIPITOR) tablet 40 mg, 40 mg, Oral, q1800, Purohit, Shrey C, MD, 40 mg at 08/05/18 1759 .  diltiazem (CARDIZEM CD) 24 hr capsule 360 mg, 360 mg, Oral, Daily, Purohit, Shrey C, MD, 360 mg at 08/06/18 1024 .  dofetilide (TIKOSYN) capsule 250 mcg, 250 mcg, Oral, BID, Rush Farmer, MD, 250 mcg at 08/06/18 1326 .  famotidine (PEPCID) tablet 20 mg, 20 mg, Oral, BID, Dana Allan I, MD, 20 mg at 08/06/18 1022 .  feeding supplement (ENSURE ENLIVE) (ENSURE ENLIVE) liquid 237 mL, 237 mL, Oral, BID BM, Simaan, Elizabeth S, PA-C, 237 mL at 08/06/18 1032 .  fentaNYL (SUBLIMAZE) injection 25-50 mcg, 25-50 mcg, Intravenous, Q1H PRN, Rush Farmer, MD, 50 mcg at 08/06/18 1014 .  heparin ADULT infusion 100 units/mL (25000 units/243mL sodium chloride 0.45%), 1,550 Units/hr, Intravenous, Continuous, Alvira Philips, [place], Last Rate: 15.5 mL/hr at 08/06/18 1017, 1,550 Units/hr at 08/06/18 1017 .  hydroxychloroquine (PLAQUENIL) tablet 200 mg, 200 mg, Oral, BID, Purohit, Shrey C, MD, 200 mg at 08/06/18 1023 .  insulin aspart (novoLOG) injection 0-9 Units, 0-9 Units, Subcutaneous, TID WC, Simaan, Elizabeth S, PA-C .  ipratropium-albuterol (DUONEB) 0.5-2.5 (3) MG/3ML nebulizer solution 3 mL, 3 mL, Nebulization, Q6H PRN, Rush Farmer, MD .  MEDLINE mouth rinse, 15 mL, Mouth Rinse, BID, Ramaswamy, Murali, MD, 15 mL at 08/06/18 1024 .  metoprolol tartrate (LOPRESSOR) injection 2.5 mg, 2.5 mg, Intravenous, Q3H PRN, Whiteheart, Kathryn A, NP, 2.5 mg at 07/26/18 1419 .  ondansetron (ZOFRAN) injection 4 mg, 4 mg, Intravenous, Q4H PRN, Saverio Danker, PA-C, 4 mg at 08/01/18 0603 .  traMADol (ULTRAM) tablet 50 mg, 50 mg, Oral, Q6H PRN, Jill Alexanders, PA-C, 50 mg at 08/05/18 1259 .  warfarin (COUMADIN) tablet 5 mg, 5 mg, Oral, ONCE-1800, Alvira Philips, [place] .  Warfarin - Pharmacist Dosing Inpatient, , Does not apply, q1800, Purohit, Konrad Dolores, MD  Patients Current Diet:  Diet Order             Diet - low sodium heart healthy        DIET SOFT Room service appropriate? Yes; Fluid consistency: Thin  Diet effective now              Precautions / Restrictions Precautions Precautions: Fall Precaution Comments: Colostomy Restrictions Weight Bearing Restrictions: No   Has the patient had 2 or more falls or a fall with injury in the past year?No  Prior Activity Level Community (5-7x/wk): independent  Development worker, international aid / Equipment Home Assistive Devices/Equipment: CBG Meter, Environmental consultant (specify type) Home Equipment: Walker - 2 wheels  Prior Device Use: Indicate devices/aids used  by the patient prior to current illness, exacerbation or injury? None of the above  Prior Functional Level Prior Function Level of Independence: Independent Comments: [place]jo[place] walking[place]  Self Care: Did the patient need help bathing, dressing, using the toilet or eating?  Independent  Indoor Mobility: Did the patient need assistance with walking from room to room (with or without device)? Independent  Stairs: Did the patient need assistance with internal or external stairs (with or without device)? Independent  Functional Cognition: Did the patient need help planning regular tasks such as shopping or remembering to take medications? Independent  Current Functional Level Cognition  Overall Cognitive Status: No family/caregiver present to determine baseline cognitive functioning Orientation Level: Oriented X4 Following Commands: Follows one step commands with increased time General Comments: Pt presenting with self limiting behavior and poor safety awareness. Pt requiring cues and encouragment throughout session    Extremity Assessment (includes Sensation/Coordination)  Upper Extremity Assessment: Generalized weakness  Lower Extremity Assessment: LLE deficits/detail, RLE deficits/detail, Defer to PT evaluation RLE Deficits / Details: RLE atrophy (pt reports baseline and does not  give her issue); R calf pain/tenderness with palpation (RN aware and to have MD assess again for DVT/etc.) LLE Deficits / Details: Edema noted LLE: Unable to fully assess due to pain LLE Coordination: decreased fine motor, decreased gross motor    ADLs  Overall ADL's : Needs assistance/impaired Eating/Feeding: Set up, Sitting Eating/Feeding Details (indicate cue type and reason): drinking water Grooming: Wash/dry face, Oral care, Minimal assistance, Sitting, Moderate assistance(+2) Grooming Details (indicate cue type and reason): Pt performing oral care while sitting at EOB. Pt requiring Min A for upright posture due to pt leaning backwards with pain. Increasing to Mod A as pt fatigued.  Upper Body Bathing: Moderate assistance, Sitting Lower Body Bathing: +2 for physical assistance, Sit to/from stand, Total assistance Upper Body Dressing : Moderate assistance, Sitting Lower Body Dressing: +2 for physical assistance, Sit to/from stand, Total assistance Toilet Transfer: +2 for physical assistance, Moderate assistance, Stand-pivot, BSC, RW Toilet Transfer Details (indicate cue type and reason): bed pan mod (A)> pt (A) with BIL LE positioning and able to elevated buttock for bed pan placement . void of bladder and total (A) for hygiene. pt able to elevated again with A to maintain knee flexion by holding L LE ankle Toileting- Clothing Manipulation and Hygiene: Total assistance, +2 for physical assistance, Sit to/from stand General ADL Comments: Pt agreeable to perform oral care at EOB. Pt with self limiting behavior    Mobility  Overal bed mobility: Needs Assistance Bed Mobility: Rolling, Sidelying to Sit Rolling: Min assist Sidelying to sit: Mod assist Sit to sidelying: Max assist, +2 for physical assistance General bed mobility comments: assistance required to bring hips to EOB and to elevate trunk into sitting    Transfers  Overall transfer level: Needs assistance Equipment used: Rolling  walker (2 wheeled) Transfer via Lift Equipment: St[place] Transfers: Sit to/from Stand Sit to Stand: +2 physical assistance, Max assist Stand pivot transfers: +2 physical assistance, Mod assist General transfer comment: assistance required to power up into standing and multimodal cues for posture; assistance to guide RW when pivoting bed to [facility]; recliner brought up behind pt after use of BSC; cues for hand placement and technique    Ambulation / Gait / Stairs / Wheelchair Mobility  Ambulation/Gait Ambulation/Gait assistance: Max assist, +2 physical assistance, +2 safety/equipment Gait Distance (Feet): 8 Feet Assistive device: Rolling walker (2 wheeled) Gait Pattern/deviations: Step-to pattern, Antalgic,  Trunk flexed General Gait Details: after stand pivot to [facility] pt felt too fatigued to attempt gait  Gait velocity: decreased  Gait velocity interpretation: <1.31 ft/sec, indicative of household ambulator    Posture / Balance Dynamic Sitting Balance Sitting balance - Comments: Reliant on Min A for upright posture Balance Overall balance assessment: Needs assistance Sitting-balance support: Bilateral upper extremity supported, Feet supported Sitting balance-Leahy Scale: Poor Sitting balance - Comments: Reliant on Min A for upright posture Postural control: Posterior lean Standing balance support: Bilateral upper extremity supported Standing balance-Leahy Scale: Poor Standing balance comment: modA +2 requires B UE on RW and external support     Special needs/care consideration BiPAP/CPAP  N/a CPM n/a Continuous Drip IV n/a Dialysis n/a Life Vest n/a Oxygen n/a Special Bed n/a Trach Size n/a Wound Vac n/a. May be placed on 8/30 Skin  Melody R, RN  Registered Nurse  WOC  Consult Note  Signed  Date of Service:  08/05/2018 10:38 AM          Signed         [place] Nurse ostomy follow up Stoma type/location: LLQ, end stoma Stomal assessment/size: 2 1/4" round, budded, edematous   Peristomal assessment: intact, tenderness noted between stoma and wound, skin is intact  Treatment options for stomal/peristomal skin: used 1/2 2" barrier ring between the stoma and the midline open wound  Output dark, green, some formed Ostomy pouching: 1pc.flat, changed to 1pc for flexibility.  2pc pouch has been leaking at [place] every day and leaking into the wound bed. This area has a dip in the abdominal topography and is very near to the midline wound.  Education provided: Explained role of ostomy nurse and creation of stoma  Explained stoma characteristics (budded, flush, color, texture, care) Demonstrated pouch change (cutting new skin barrier, measuring stoma, cleaning peristomal skin and stoma, use of barrier ring) Education on emptying when 1/3 to 1/2 full and how to empty Demonstrated use of wick to clean spout   Husband was to meet Pi[place] at [place] for teaching session. Patient reports he's not going to be here. "he is getting tires on the back of the truck".  We planned another session with her husband who she desires to learn as well on Thursday.  Patient has no other family here in town. Will need HHRN for assistance at home after DC.  May DC to inpatient rehab per notes in the chart.     Enrolled patient in [place] Start Discharge program: Yes   [place] Nurse wound follow up Wound type: midline incision, open Measurement:19cm x 6.5cm x 8.5 at distal end Wound bed: clean, moist and pink Drainage (amount, consistency, odor) moderate, unable to assess consistency due to stool contamination in the wound bed Peri wound: intact  Dressing procedure/placement/frequency: Saline moist gauze, cover with dry dressing, secure with tape.   May benefit from NPWT dressing, would require to be changed with ostomy pouch each time due to proximity of wound to the stoma.  Would suggest placement tomorrow for M/W/F schedule.  [place] nurse team will follow along for support with  ostomy care and wound care as needed.   Melody [facility], CNS, CWON-AP [phone]    Bowel mgmt:  New colostomy this admit Bladder mgmt: external catheter Diabetic mgmt yes   Previous Home Environment Living Arrangements: Spouse/significant other  Lives With: Spouse Available Help at Discharge: Family, Available 24 hours/day Type of Home: House Home Layout: One level Home Access: Stairs to enter  Entrance Stairs-Rails: Right, Left, Can reach both Entrance Stairs-Number of Steps: 3 Bathroom Shower/Tub: Multimedia programmer: Handicapped height Bathroom Accessibility: Yes How Accessible: Accessible via walker [place]: No Additional Comments: 3 children in [place]  Discharge Living Setting Plans for Discharge Living Setting: Patient's home, Lives with (comment)(spouse) Type of Home at Discharge: House Discharge Home Layout: One level Discharge Home Access: Stairs to enter Entrance Stairs-Rails: Right, Left, Can reach both Entrance Stairs-Number of Steps: 3 Discharge Bathroom Shower/Tub: Walk-in shower Discharge Bathroom Toilet: Handicapped height Discharge Bathroom Accessibility: Yes How Accessible: Accessible via walker Does the patient have any problems obtaining your medications?: No  Social/Family/Support Systems Patient Roles: Spouse, Parent Contact Information: [name], daughter lives in [place] Anticipated Caregiver: spouse, [name] may returen to [place] to asisst if available Anticipated Caregiver's Contact Information: [phone] Ability/Limitations of Caregiver: Live in [place] Caregiver Availability: 24/7 Discharge Plan Discussed with Primary Caregiver: Yes Is Caregiver In Agreement with Plan?: Yes Does Caregiver/Family have Issues with Lodging/Transportation while Pt is in Rehab?: No  Daughter, [name], currently here at bedside until she returns to [facility] 08/09/18. She may return to assist at d/c.  Goals/Additional Needs Patient/Family Goal for  Rehab: supervision to min asisst with PT and OT Expected length of stay: ELOS 14 to 20 days Special Service Needs: will likely d/c home with wound VAC Pt/Family Agrees to Admission and willing to participate: Yes Program Orientation Provided & Reviewed with Pt/Caregiver Including Roles  & Responsibilities: Yes  Decrease burden of Care through IP rehab admission: n/a  Possible need for SNF placement upon discharge: possible if pt functionally does not reach supervision to min assist level to d/c home or if complicated wound care issues need further care before d/c home  Patient Condition: This patient's medical and functional status has changed since the consult dated: 07/31/2018 in which the Rehabilitation Physician determined and documented that the patient's condition is appropriate for intensive rehabilitative care in an inpatient rehabilitation facility. See "History of Present Illness" (above) for medical update. Functional changes are: overall mod to max assist. Patient's medical and functional status update has been discussed with the Rehabilitation physician and patient remains appropriate for inpatient rehabilitation. Will admit to inpatient rehab today.  Preadmission Screen Completed By:  Cleatrice Burke, 08/06/2018 2:13 PM ______________________________________________________________________   Discussed status with Dr. Posey Pronto on 08/06/2018 at  1422 and received telephone approval for admission today.  Admission Coordinator:  Cleatrice Burke, time 6967 Date 08/06/2018

## 2018-08-06 NOTE — Consult Note (Signed)
[place] Nurse wound follow up Wound type:surgical incision, midline Measurement: 18cm x 6cm x 7cm Wound DBZ:MCEYE portion pink and clean appearing. Lower portion has tan slough present at bottom of deep wound bed.  Drainage (amount, consistency, odor) hard to odor or drainage color and amt due to liquid stool leaking into wound.  Periwound: intact Dressing procedure/placement/frequency: The plan was to place on NPWT today, however due to slough will hold off and place Friday if amount of slough less or gone. CCS PA present for assessment. Will order TID wet to dry dressing changes until can be re-evaluated Friday. Pt is taking in protein drinks to assist in wound healing.   [place] Nurse ostomy follow up Stoma type/location: LLQ end colostomy Stomal assessment/size:  2 1/4" round, edematous, above skin level. Peristomal assessment: intact Treatment options for stomal/peristomal skin: 1/2 of a barrier ring at 9 o'clock Output 400cc slightly thick liquid brown stool Ostomy pouching: 1pc.for flexibility Education provided: Pouch was much too full, leaking at 9 o'clock into wound.  Enrolled patient in [place] Discharge program: Yes, previously Reminded pt about need to empty when stool is up to the printed words on the ostomy pouch. Pt has not been out of bed much. States she is so weak she is letting staff empty. Asked her to let them know when it needs emptying. Reviewed toilet paper wicks to cleanse last two pieces of pouch. Patient seems like all of this is new but she has been taught this before, I was repeating teaching. Pt's daughter is present today but does not live near by,. Husband will still need to be the main person to be taught. Pt is however planning on going to inpatient rehab so will not be discharged right away. Now that pouch was changed today appointment with husband may need to be moved to Friday. [place] team will continue to follow and plan to see Friday for possible NPWT  placement. Fara Olden, RN-C, WTA-C, [place] Wound Treatment Associate Ostomy Care Associate

## 2018-08-06 NOTE — Care Management Note (Signed)
Case Management Note  Patient Details  Name: Melody Casey MRN: [id number] Date of Birth: [birth year]  Subjective/Objective:           Pt admitted with sepsis 2/2 to bowel perf.  Pt s/p hartmans colectomy, 8/16.            Action/Plan: Transition to CIR today.  Expected Discharge Date:  08/06/18               Expected Discharge Plan:  [place]  In-House Referral:  Clinical Social Work  Discharge planning Services  CM Consult  Post Acute Care Choice:    Choice offered to:       Status of Service:  Completed, signed off  If discussed at [employer], dates discussed:    Additional Comments:  Sharin Mons, RN 08/06/2018, 1:28 PM

## 2018-08-06 NOTE — Progress Notes (Signed)
Called report to CIR nurse. Pt currently has heparin gtt going at 15.5 and nurse aware. All belongings packed and pt/family informed regarding pt transfer to CIR. Pt stable at this time. Pt being transferred to room 412.

## 2018-08-06 NOTE — H&P (Signed)
Physical Medicine and Rehabilitation Admission H&P    Chief Complaint  Patient presents with  . Abdominal Pain    diverticulitis flare  : HPI: Melody Casey is a 72 year old right-handed female with history of rheumatoid arthritis, CAD with non-STEMI, atrial fibrillation on chronic Coumadin as well as Tikosyn, infrarenal AAA, history of breast cancer as well as frequent recurrent sigmoid diverticulitis.  Per chart review and patient, patient lives with spouse.  Reported to be independent and active prior to admission.  One level home with 3 steps to entry.  Presented 07/24/2018 with persistent abdominal pain x5 days left lower quadrant.  CT scan showed extensive sigmoid diverticulitis with evidence of perforation containing air and stool.  She has small focus of pneumoperitoneum.  Noted INR upon admission of 3 and was reversed.  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 Donne Hazel.  Hospital course pain management.  Follow-up cardiology services for atrial fibrillation with RVR, she did require pressors.  Her chronic Coumadin has since been resumed.  Lower extremity Dopplers negative.  Acute blood loss anemia 8.1 and monitored.  Hospital course with ileus diet has slowly been advanced.  Therapy evaluations completed with recommendations of physical medicine rehab consult.  Patient was admitted for a comprehensive rehab program  Review of Systems  Constitutional: Negative for chills and fever.  HENT: Negative for hearing loss.   Eyes: Negative for blurred vision and double vision.  Respiratory: Negative for cough.   Cardiovascular: Positive for leg swelling. Negative for chest pain.  Gastrointestinal: Positive for abdominal pain and nausea. Negative for constipation.  Genitourinary: Negative for dysuria, flank pain and hematuria.  Musculoskeletal: Positive for myalgias.  Skin: Negative for rash.  Neurological: Positive for weakness. Negative for sensory change and  speech change.  All other systems reviewed and are negative.  Past Medical History:  Diagnosis Date  . AAA (abdominal aortic aneurysm) ([place])   . Acute diverticulitis   . Anxiety   . Arthritis   . Atrial fibrillation ([place])    a. Dx 03/2011 - on tikosyn/coumadin.  Marland Kitchen CAD (coronary artery disease)    a. NSTEMI 02/2011: occ mid Cx, DES to OM2, residual nonobst LAD dz.  . Cancer ([place])    breast  . Diabetes mellitus   . Diverticulitis   . Diverticulitis of colon     2008, 04/2011, 12/2014, 08/2015  . 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 Hemangioma    liver  . HLD (hyperlipidemia)   . HTN (hypertension)   . Osteoporosis   . Tobacco abuse    stopped smoking 2012  . Ureteral obstruction    History of gross hematuria/right hydronephrosis 2/2 to uteropelvic junction obstruction, s/p cystoscopy in January 2007 with bilateral retrograde pyelography, right ureter arthroscopy, right ureteral stent placement, bladder biopsies, stent removal since then.  . Wears dentures    top   Past Surgical History:  Procedure Laterality Date  . BIOPSY N/A 11/01/2015   Procedure: BIOPSY;  Surgeon: Danie Binder, MD;  Location: [facility];  Service: Endoscopy;  Laterality: N/A;  . BREAST LUMPECTOMY WITH NEEDLE LOCALIZATION AND AXILLARY SENTINEL LYMPH NODE BX Right 03/01/2014   Procedure: BREAST LUMPECTOMY WITH NEEDLE LOCALIZATION AND AXILLARY SENTINEL LYMPH NODE BX;  Surgeon: Edward Jolly, MD;  Location: [place];  Service: General;  Laterality: Right;  . BREAST SURGERY    . CARDIAC CATHETERIZATION  2012   stent  . COLON RESECTION N/A 07/25/2018   Procedure: COLOSTOMY;  Surgeon: Rolm Bookbinder, MD;  Location: [facility];  Service: General;  Laterality: N/A;  . COLONOSCOPY WITH PROPOFOL N/A 06/29/2014   Dr. Wynetta Emery: universal diverticulosis  . CORONARY STENT PLACEMENT  2012  . ESOPHAGOGASTRODUODENOSCOPY (EGD) WITH PROPOFOL N/A 11/01/2015   Procedure: ESOPHAGOGASTRODUODENOSCOPY  (EGD) WITH PROPOFOL;  Surgeon: Danie Binder, MD;  Location: [facility];  Service: Endoscopy;  Laterality: N/A;  . KNEE ARTHROSCOPY     left  . LAPAROTOMY N/A 07/25/2018   Procedure: EXPLORATORY LAPAROTOMY;  Surgeon: Rolm Bookbinder, MD;  Location: [facility];  Service: General;  Laterality: N/A;  . PARTIAL COLECTOMY N/A 07/25/2018   Procedure: COLECTOMY;  Surgeon: Rolm Bookbinder, MD;  Location: [facility];  Service: General;  Laterality: N/A;  . Right leg surgery  age 81   As a child for  ?scleroderma per patient  . TONSILLECTOMY AND ADENOIDECTOMY    . TUBAL LIGATION    . Ureteral surgery  2011   rt ureterostomy-stent   Family History  Problem Relation Age of Onset  . Lung cancer Father 32       deceased  . Cancer Father   . Heart failure Mother 31       deceased  . Heart disease Mother   . Diabetes Brother   . Hypertension Brother   . Cancer Brother        prostate  . Colon cancer Neg Hx   . Liver disease Neg Hx    Social History:  reports that she quit smoking about 7 years ago. Her smoking use included cigarettes. She has a 100.00 pack-year smoking history. She has never used smokeless tobacco. She reports that she does not drink alcohol or use drugs. Allergies:  Allergies  Allergen Reactions  . Miralax [Polyethylene Glycol] Swelling and Rash    Took [facility] brand developed rash. Patient states she tolerated name brand MiraLax in the past.  09/01/15. Patient states she had diffuse swelling including swelling of her lips.   . Vancomycin Rash and Shortness Of Breath  . Acetaminophen Hives  . Oxycodone-Acetaminophen Itching  . Sulfa Antibiotics Rash    All over rash  . Sulfacetamide Sodium Rash    All over rash  . Sulfasalazine Rash    All over rash All over rash  . Banana Other (See Comments)    Unknown  . Latex Rash  . Tape Rash   Medications Prior to Admission  Medication Sig Dispense Refill  . ALPRAZolam (XANAX) 0.25 MG tablet Take 1 tablet (0.25 mg total) by mouth daily  as needed for anxiety. 30 tablet 1  . amLODipine (NORVASC) 2.5 MG tablet Take 1 tablet (2.5 mg total) by mouth daily. Please call to make appointment for further refills. 90 tablet 0  . anastrozole (ARIMIDEX) 1 MG tablet Take 1 tablet (1 mg total) by mouth daily. 90 tablet 3  . atorvastatin (LIPITOR) 40 MG tablet Take 1 1/2 tablets daily 45 tablet 6  . [EXPIRED] ciprofloxacin (CIPRO) 500 MG tablet Take 1 tablet (500 mg total) by mouth 2 (two) times daily for 10 days. 20 tablet 0  . dofetilide (TIKOSYN) 250 MCG capsule Take 1 capsule (250 mcg total) by mouth 2 (two) times daily. 180 capsule 3  . hydroxychloroquine (PLAQUENIL) 200 MG tablet Take by mouth 2 (two) times daily.    Marland Kitchen KLOR-CON M20 20 MEQ tablet TAKE 1 TABLET BY MOUTH TWICE DAILY (Patient taking differently: Take 20 mEq by mouth daily. ) 180 tablet 1  . linaclotide (LINZESS) 145 MCG  CAPS capsule Take 1 capsule (145 mcg total) by mouth daily before breakfast. 30 capsule 0  . lisinopril (PRINIVIL,ZESTRIL) 40 MG tablet TAKE 1 TABLET BY MOUTH IN THE MORNING 90 tablet 1  . magnesium oxide (MAG-OX) 400 MG tablet Take 1 tablet (400 mg total) by mouth daily. Take 400 mg twice a day with food (Patient taking differently: Take 400 mg by mouth daily. )    . metFORMIN (GLUCOPHAGE) 500 MG tablet TAKE 1 TABLET BY MOUTH ONCE DAILY 90 tablet 0  . [EXPIRED] metroNIDAZOLE (FLAGYL) 500 MG tablet Take 1 tablet (500 mg total) by mouth 2 (two) times daily for 10 days. 20 tablet 0  . nitroGLYCERIN (NITROSTAT) 0.4 MG SL tablet Place 1 tablet (0.4 mg total) under the tongue every 5 (five) minutes as needed for chest pain. 25 tablet 5  . warfarin (COUMADIN) 5 MG tablet Take 1 tablet daily except on Thursdays take 1 and 1/2 tablets. (Patient taking differently: Take 5 mg by mouth daily. ) 102 tablet 0  . amoxicillin (AMOXIL) 500 MG capsule Take 1 capsule (500 mg total) by mouth 3 (three) times daily. (Patient not taking: Reported on 07/24/2018) 21 capsule 0    Drug  Regimen Review Drug regimen was reviewed and remains appropriate with no significant issues identified  Home: Home Living Family/patient expects to be discharged to:: Private residence Living Arrangements: Spouse/significant other Available Help at Discharge: Family, Available 24 hours/day Type of Home: House Home Access: Stairs to enter [employer] of Steps: 3 Entrance Stairs-Rails: Right, Left, Can reach both Home Layout: One level Bathroom Shower/Tub: Multimedia programmer: Handicapped height Home Equipment: Environmental consultant - 2 wheels   Functional History: Prior Function Level of Independence: Independent Comments: [place]jo[place] walking[place]  Functional Status:  Mobility: Bed Mobility Overal bed mobility: Needs Assistance Bed Mobility: Rolling, Sidelying to Sit Rolling: Min assist Sidelying to sit: Mod assist Sit to sidelying: Max assist, +2 for physical assistance General bed mobility comments: assistance required to bring hips to EOB and to elevate trunk into sitting Transfers Overall transfer level: Needs assistance Equipment used: Rolling walker (2 wheeled) Transfer via Lift Equipment: Stedy Transfers: Sit to/from Stand Sit to Stand: +2 physical assistance, Max assist Stand pivot transfers: +2 physical assistance, Mod assist General transfer comment: assistance required to power up into standing and multimodal cues for posture; assistance to guide RW when pivoting bed to [facility]; recliner brought up behind pt after use of BSC; cues for hand placement and technique Ambulation/Gait Ambulation/Gait assistance: Max assist, +2 physical assistance, +2 safety/equipment Gait Distance (Feet): 8 Feet Assistive device: Rolling walker (2 wheeled) Gait Pattern/deviations: Step-to pattern, Antalgic, Trunk flexed General Gait Details: after stand pivot to [facility] pt felt too fatigued to attempt gait  Gait velocity: decreased  Gait velocity interpretation: <1.31 ft/sec,  indicative of household ambulator    ADL: ADL Overall ADL's : Needs assistance/impaired Eating/Feeding: Set up, Sitting Eating/Feeding Details (indicate cue type and reason): drinking water Grooming: Wash/dry face, Oral care, Minimal assistance, Sitting, Moderate assistance(+2) Grooming Details (indicate cue type and reason): Pt performing oral care while sitting at EOB. Pt requiring Min A for upright posture due to pt leaning backwards with pain. Increasing to Mod A as pt fatigued.  Upper Body Bathing: Moderate assistance, Sitting Lower Body Bathing: +2 for physical assistance, Sit to/from stand, Total assistance Upper Body Dressing : Moderate assistance, Sitting Lower Body Dressing: +2 for physical assistance, Sit to/from stand, Total assistance Toilet Transfer: +2 for physical  assistance, Moderate assistance, Stand-pivot, BSC, RW Toilet Transfer Details (indicate cue type and reason): bed pan mod (A)> pt (A) with BIL LE positioning and able to elevated buttock for bed pan placement . void of bladder and total (A) for hygiene. pt able to elevated again with A to maintain knee flexion by holding L LE ankle Toileting- Clothing Manipulation and Hygiene: Total assistance, +2 for physical assistance, Sit to/from stand General ADL Comments: Pt agreeable to perform oral care at EOB. Pt with self limiting behavior  Cognition: Cognition Overall Cognitive Status: No family/caregiver present to determine baseline cognitive functioning Orientation Level: Oriented X4 Cognition Arousal/Alertness: Awake/alert Behavior During Therapy: WFL for tasks assessed/performed Overall Cognitive Status: No family/caregiver present to determine baseline cognitive functioning Area of Impairment: Following commands, Problem solving Following Commands: Follows one step commands with increased time Problem Solving: Slow processing, Decreased initiation General Comments: Pt presenting with self limiting behavior and  poor safety awareness. Pt requiring cues and encouragment throughout session  Physical Exam: Blood pressure (!) 119/55, pulse 86, temperature (!) 97.5 F (36.4 C), resp. rate 17, height 5' 8"  (1.727 m), weight 75.3 kg, SpO2 98 %. Physical Exam  Vitals reviewed. Constitutional: She is oriented to person, place, and time. She appears well-developed and well-nourished.  HENT:  Head: Normocephalic and atraumatic.  Eyes: EOM are normal. Right eye exhibits no discharge. Left eye exhibits no discharge.  Neck: Normal range of motion. Neck supple. No thyromegaly present.  Cardiovascular: Normal rate and regular rhythm.  Respiratory: Effort normal and breath sounds normal.  GI: Soft. Bowel sounds are normal.  Colostomy in place.  Abdominal wound with new dressing placed  Musculoskeletal:  Greater than left foot deformity Right foot atrophy Left foot edema  Neurological: She is alert and oriented to person, place, and time.  Motor: Bilateral upper: 4/5 proximal distal Right lower extremity: Hip flexion, knee extension 3+/5, ankle contracture Left lower cervical and hip flexion, knee extension 2/5, ankle 2+/5  Skin: Skin is warm and dry.  Psychiatric: Her speech is delayed. She is agitated.    Results for orders placed or performed during the hospital encounter of 07/24/18 (from the past 48 hour(s))  Heparin level (unfractionated)     Status: Abnormal   Collection Time: 08/04/18 10:21 AM  Result Value Ref Range   Heparin Unfractionated 0.29 (L) 0.30 - 0.70 IU/mL    Comment: (NOTE) If heparin results are below expected values, and patient dosage has  been confirmed, suggest follow up testing of antithrombin III levels. Performed at [facility], [place] [address], [place], [place] [address]   Glucose, capillary     Status: Abnormal   Collection Time: 08/04/18 11:22 AM  Result Value Ref Range   Glucose-Capillary 118 (H) 70 - 99 mg/dL  Glucose, capillary     Status: Abnormal    Collection Time: 08/04/18  4:20 PM  Result Value Ref Range   Glucose-Capillary 115 (H) 70 - 99 mg/dL  Glucose, capillary     Status: Abnormal   Collection Time: 08/04/18  5:56 PM  Result Value Ref Range   Glucose-Capillary 121 (H) 70 - 99 mg/dL  Glucose, capillary     Status: Abnormal   Collection Time: 08/04/18 10:29 PM  Result Value Ref Range   Glucose-Capillary 118 (H) 70 - 99 mg/dL  Basic metabolic panel     Status: Abnormal   Collection Time: 08/05/18  5:46 AM  Result Value Ref Range   Sodium 129 (L) 135 - 145  mmol/L   Potassium 3.8 3.5 - 5.1 mmol/L   Chloride 102 98 - 111 mmol/L   CO2 21 (L) 22 - 32 mmol/L   Glucose, Bld 103 (H) 70 - 99 mg/dL   BUN 7 (L) 8 - 23 mg/dL   Creatinine, Ser 0.39 (L) 0.44 - 1.00 mg/dL   Calcium 8.4 (L) 8.9 - 10.3 mg/dL   GFR calc non Af Amer >60 >60 mL/min   GFR calc Af Amer >60 >60 mL/min    Comment: (NOTE) The eGFR has been calculated using the CKD EPI equation. This calculation has not been validated in all clinical situations. eGFR's persistently <60 mL/min signify possible Chronic Kidney Disease.    Anion gap 6 5 - 15    Comment: Performed at [facility] [address], [place], [place] [address]  Magnesium     Status: None   Collection Time: 08/05/18  5:46 AM  Result Value Ref Range   Magnesium 1.8 1.7 - 2.4 mg/dL    Comment: Performed at [facility] [address] [place], [place] [address]  Heparin level (unfractionated)     Status: None   Collection Time: 08/05/18  5:46 AM  Result Value Ref Range   Heparin Unfractionated 0.59 0.30 - 0.70 IU/mL    Comment: (NOTE) If heparin results are below expected values, and patient dosage has  been confirmed, suggest follow up testing of antithrombin III levels. Performed at [facility], [place] [address] [place], [place] [address]   Protime-INR     Status: Abnormal   Collection Time: 08/05/18  5:46 AM  Result Value Ref Range   Prothrombin Time 15.8 (H) 11.4 - 15.2  seconds   INR 1.27     Comment: Performed at [facility] [address], [place], [place] [address]  Glucose, capillary     Status: None   Collection Time: 08/05/18  7:58 AM  Result Value Ref Range   Glucose-Capillary 95 70 - 99 mg/dL  Glucose, capillary     Status: Abnormal   Collection Time: 08/05/18 12:02 PM  Result Value Ref Range   Glucose-Capillary 100 (H) 70 - 99 mg/dL  Glucose, capillary     Status: None   Collection Time: 08/05/18  5:03 PM  Result Value Ref Range   Glucose-Capillary 99 70 - 99 mg/dL  Glucose, capillary     Status: None   Collection Time: 08/05/18 10:32 PM  Result Value Ref Range   Glucose-Capillary 83 70 - 99 mg/dL  Magnesium     Status: Abnormal   Collection Time: 08/06/18  3:30 AM  Result Value Ref Range   Magnesium 1.5 (L) 1.7 - 2.4 mg/dL    Comment: Performed at [facility], [place] [address] [place], [place] [address]  Heparin level (unfractionated)     Status: Abnormal   Collection Time: 08/06/18  3:30 AM  Result Value Ref Range   Heparin Unfractionated 0.29 (L) 0.30 - 0.70 IU/mL    Comment: (NOTE) If heparin results are below expected values, and patient dosage has  been confirmed, suggest follow up testing of antithrombin III levels. Performed at [facility], [place] [address] [place],  [address]   Protime-INR     Status: Abnormal   Collection Time: 08/06/18  3:30 AM  Result Value Ref Range   Prothrombin Time 17.1 (H) 11.4 - 15.2 seconds   INR 1.41     Comment: Performed at [facility], [place] [address] [place], [place] [address]  CBC     Status: Abnormal   Collection Time: 08/06/18  3:30 AM  Result Value Ref Range   WBC 5.8 4.0 - 10.5 K/uL   RBC 2.92 (L) 3.87 - 5.11 MIL/uL   Hemoglobin 8.1 (L) 12.0 - 15.0 g/dL   HCT 24.9 (L) 36.0 - 46.0 %   MCV 85.3 78.0 - 100.0 fL   MCH 27.7 26.0 - 34.0 pg   MCHC 32.5 30.0 - 36.0 g/dL   RDW 18.3 (H) 11.5 - 15.5 %   Platelets 329 150 - 400 K/uL    Comment: Performed  at [facility], [place] [address] [place], [place] [address]  Basic metabolic panel     Status: Abnormal   Collection Time: 08/06/18  3:30 AM  Result Value Ref Range   Sodium 128 (L) 135 - 145 mmol/L   Potassium 4.0 3.5 - 5.1 mmol/L   Chloride 102 98 - 111 mmol/L   CO2 20 (L) 22 - 32 mmol/L   Glucose, Bld 80 70 - 99 mg/dL   BUN 7 (L) 8 - 23 mg/dL   Creatinine, Ser 0.46 0.44 - 1.00 mg/dL   Calcium 8.5 (L) 8.9 - 10.3 mg/dL   GFR calc non Af Amer >60 >60 mL/min   GFR calc Af Amer >60 >60 mL/min    Comment: (NOTE) The eGFR has been calculated using the CKD EPI equation. This calculation has not been validated in all clinical situations. eGFR's persistently <60 mL/min signify possible Chronic Kidney Disease.    Anion gap 6 5 - 15    Comment: Performed at [facility] [address], [place],  [address]  Glucose, capillary     Status: Abnormal   Collection Time: 08/06/18  8:11 AM  Result Value Ref Range   Glucose-Capillary 68 (L) 70 - 99 mg/dL   No results found.     Medical Problem List and Plan: 1.  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  DVT Prophylaxis/Anticoagulation: Heparin bridge to chronic Coumadin resumed for history of atrial fibrillation 3. Pain Management: Ultram as needed 4. Mood: Xanax 0.25 mg 3 times daily as needed 5. Neuropsych: This patient is capable of making decisions on her own behalf. 6. Skin/Wound Care: Routine skin checks.  Midline wound dressings 3 times daily.  Surgery to follow-up Friday to discuss possible need for wound VAC 7. Fluids/Electrolytes/Nutrition: Routine in and outs with follow-up chemistries 8.  Acute blood loss anemia.  Follow-up CBC 9.  Atrial fibrillation.  Cardizem 360 mg daily, Tikosyn 250 mg twice daily.  Cardiac rate controlled 10.  Hypertension.  Norvasc 2.5 mg daily 11.  Rheumatoid  arthritis.  Plaquenil 200 mg twice daily 12.  Hyperlipidemia.  Lipitor  Post Admission Physician Evaluation: 1. Preadmission assessment reviewed and changes made below. 2. Functional deficits secondary  to debility. 3. Patient is admitted to receive collaborative, interdisciplinary care between the physiatrist, rehab nursing staff, and therapy team. 4. Patient's level of medical complexity and substantial therapy needs in context of that medical necessity cannot be provided at a lesser intensity of care such as a SNF. 5. Patient has experienced substantial functional loss from his/her baseline which was documented above under the "Functional History" and "Functional Status" headings.  Judging by the patient's diagnosis, physical exam, and functional history, the patient has potential for functional progress which will  result in measurable gains while on inpatient rehab.  These gains will be of substantial and practical use upon discharge  in facilitating mobility and self-care at the household level. 6. Physiatrist will provide 24 hour management of medical needs as well as oversight of the therapy plan/treatment and provide guidance as appropriate regarding the interaction of the two. 7. 24 hour rehab nursing will assist with bladder management, bowel management, safety, skin/wound care, disease management, pain management and patient education  and help integrate therapy concepts, techniques,education, etc. 8. PT will assess and treat for/with: Lower extremity strength, range of motion, stamina, balance, functional mobility, safety, adaptive techniques and equipment, wound care, coping skills, pain control, education. Goals are: Min/Mod A. 9. OT will assess and treat for/with: ADL's, functional mobility, safety, upper extremity strength, adaptive techniques and equipment, wound mgt, ego support, and community reintegration.   Goals are: Min/Mod A. Therapy may proceed with showering this  patient. 10. Case Management and Social Worker will assess and treat for psychological issues and discharge planning. 11. Team conference will be held weekly to assess progress toward goals and to determine barriers to discharge. 12. Patient will receive at least 3 hours of therapy per day at least 5 days per week. 13. ELOS: 12-17 days.       14. Prognosis:  good  I have personally performed a face to face diagnostic evaluation, including, but not limited to relevant history and physical exam findings, of this patient and developed relevant assessment and plan.  Additionally, I have reviewed and concur with the physician assistant's documentation above.   The patient's status has not changed. The original post admission physician evaluation remains appropriate, and any changes from the pre-admission screening or documentation from the acute chart are noted above.   Delice Lesch, MD, ABPMR Lavon Paganini Angiulli, PA-C 08/06/2018

## 2018-08-06 NOTE — Progress Notes (Signed)
[place] for Heparin >> warfarin Indication: atrial fibrillation  Allergies  Allergen Reactions  . Miralax [Polyethylene Glycol] Swelling and Rash    Took [facility] brand developed rash. Patient states she tolerated name brand MiraLax in the past.  09/01/15. Patient states she had diffuse swelling including swelling of her lips.   . Vancomycin Rash and Shortness Of Breath  . Acetaminophen Hives  . Oxycodone-Acetaminophen Itching  . Sulfa Antibiotics Rash    All over rash  . Sulfacetamide Sodium Rash    All over rash  . Sulfasalazine Rash    All over rash All over rash  . Banana Other (See Comments)    Unknown  . Latex Rash  . Tape Rash   Patient Measurements: Height: 5\' 8"  (172.7 cm) Weight: 166 lb (75.3 kg) IBW/kg (Calculated) : 63.9 Heparin Dosing Weight: 76 kg  Vital Signs: Temp: 97.5 F (36.4 C) (08/28 0549) Temp Source: Oral (08/27 2233) BP: 119/55 (08/28 0549) Pulse Rate: 86 (08/28 0549)  Labs: Recent Labs    08/04/18 0224 08/04/18 1021 08/05/18 0546 08/06/18 0330  HGB 9.1*  --   --  8.1*  HCT 26.7*  --   --  24.9*  PLT 401*  --   --  329  LABPROT  --   --  15.8* 17.1*  INR  --   --  1.27 1.41  HEPARINUNFRC  --  0.29* 0.59 0.29*  CREATININE 0.48  --  0.39* 0.46   Estimated Creatinine Clearance: 62.2 mL/min (by C-G formula based on SCr of 0.46 mg/dL).  Assessment: 37 yoF on warfarin and dofetilide PTA for afib. Admitted on 8/15 with abdominal pain and found to have bowel perforation now s/p colectomy and colostomy placement. Transferred to the ICU postoperatively. Heparin started 8/19 post op for anticoagulation in place of PTA warfarin.   Warfarin resumed 8/26. PTA regimen: 5 mg/day  Heparin level just below goal at 0.29 on 1500 units/hr.  INR subtherapeutic at 1.41. No bleeding noted, Hgb low stable, platelets are normal.  Goal of Therapy:  INR 2-3 Heparin level 0.3-0.5 Monitor platelets by anticoagulation  protocol: Yes   Plan:  Increase heparin drip to 1550 units/hr Daily heparin level and CBC and INR Repeat Warfarin 5 mg PO tonight Monitor for s/sx bleeding   Renold Genta, PharmD, BCPS Clinical Pharmacist Clinical phone for 08/06/2018 until 3p is [phone] Please check AMION for all Pharmacist numbers by unit 08/06/2018 10:28 AM

## 2018-08-06 NOTE — Discharge Summary (Signed)
Physician Discharge Summary  Melody Casey VPX:[id number] DOB: 08/31/1946 DOA: 07/24/2018  PCP: Dettinger, [name], MD  Admit date: 07/24/2018 Discharge date: 08/06/2018  Admitted From: Home Disposition: CIR  Discharge Condition:Stable CODE STATUS:FULL Diet recommendation:  Soft diet   Brief/Interim Summary: Patient is a 72 year old female with past medical history of recurrent diverticular disease, anxiety/depression, intracranial aortic aneurysm, chronic atrial fibrillation on warfarin, history of nephrolithiasis, breast cancer on remission on anastrozole, coronary disease, hypertension, diabetes type 2, hyperlipidemia who was admitted on 07/24/2018 with complaints of abdominal pain and found to have fecal peritonitis due to perforated diverticulum .She  is currently  status post left colectomy,  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  Her postoperative stay was completed by episodes of hypertension and A. fib with RVR.  Currently she is hemodynamically stable and is stable for discharge to CIR.  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  General surgery following. Completed antibiotics course.  Continue pain management.  Right lower quadrant JP drain discontinued on 08/03/18.  Continue colostomy care.  General surgery will follow her at [facility] for placement  VAC.  Chronic A. fib with RVR: Currently heart rate is controlled.  Continue Cardizem, dofetilide.  Was on heparin drip.  Warfarin restarted.  Hypertension/hyperlipidemia/coronary artery disease: Continue current medications.  On amlodipine, Lipitor,  Cardizem.Status post DES to OM 2 in 2012.  BP is stable on current meds.  Lisinopril may not be needed on discharge.  Type 2 diabetes mellitus: Currently on sliding scale insulin.  History of breast cancer: Currently in remission.  Continue  anastrozole.  History of scleroderma: Continue hydroxychloroquine 200 mg.  Hypomagnesemia: Supplemented  Hyponatremia:Na is 128 today.Check BMP in 2-3 days. Discharge Diagnoses:  Principal Problem:   Sepsis ([place]) Active Problems:   Type 2 diabetes mellitus with complication, without long-term current use of insulin (HCC)   CAD (coronary artery disease)   Anxiety and depression   AAA (abdominal aortic aneurysm) without rupture (HCC)   Microcytic anemia   Diverticulosis   Acute diverticulitis of intestine   Bowel perforation (HCC)   Hyponatremia    Discharge Instructions  Discharge Instructions    Diet - low sodium heart healthy   Complete by:  As directed    Soft   Discharge instructions   Complete by:  As directed    Check CBC,BMP,INR,magnesium level in 2 days.   Increase activity slowly   Complete by:  As directed      Allergies as of 08/06/2018      Reactions   Miralax [polyethylene Glycol] Swelling, Rash   Took [facility] brand developed rash. Patient states she tolerated name brand MiraLax in the past. 09/01/15. Patient states she had diffuse swelling including swelling of her lips.    Vancomycin Rash, Shortness Of Breath   Acetaminophen Hives   Oxycodone-acetaminophen Itching   Sulfa Antibiotics Rash   All over rash   Sulfacetamide Sodium Rash   All over rash   Sulfasalazine Rash   All over rash All over rash   Banana Other (See Comments)   Unknown   Latex Rash   Tape Rash      Medication List    STOP taking these medications   amoxicillin 500 MG capsule Commonly known as:  AMOXIL   ciprofloxacin 500 MG tablet Commonly known as:  CIPRO   lisinopril 40 MG tablet Commonly known as:  PRINIVIL,ZESTRIL   metroNIDAZOLE 500 MG tablet Commonly  known as:  FLAGYL   nitroGLYCERIN 0.4 MG SL tablet Commonly known as:  NITROSTAT     TAKE these medications   ALPRAZolam 0.25 MG tablet Commonly known as:  XANAX Take 1 tablet (0.25 mg total) by mouth daily  as needed for anxiety.   amLODipine 2.5 MG tablet Commonly known as:  NORVASC Take 1 tablet (2.5 mg total) by mouth daily. Please call to make appointment for further refills.   anastrozole 1 MG tablet Commonly known as:  ARIMIDEX Take 1 tablet (1 mg total) by mouth daily.   atorvastatin 40 MG tablet Commonly known as:  LIPITOR Take 1 tablet (40 mg total) by mouth daily at 6 PM. Take 1 1/2 tablets daily What changed:    how much to take  how to take this  when to take this   diltiazem 360 MG 24 hr capsule Commonly known as:  CARDIZEM CD Take 1 capsule (360 mg total) by mouth daily. Start taking on:  08/07/2018   dofetilide 250 MCG capsule Commonly known as:  TIKOSYN Take 1 capsule (250 mcg total) by mouth 2 (two) times daily.   hydroxychloroquine 200 MG tablet Commonly known as:  PLAQUENIL Take by mouth 2 (two) times daily.   linaclotide 145 MCG Caps capsule Commonly known as:  LINZESS Take 1 capsule (145 mcg total) by mouth daily before breakfast.   magnesium oxide 400 MG tablet Commonly known as:  MAG-OX Take 1 tablet (400 mg total) by mouth daily. Take 400 mg twice a day with food What changed:  additional instructions   metFORMIN 500 MG tablet Commonly known as:  GLUCOPHAGE TAKE 1 TABLET BY MOUTH ONCE DAILY   potassium chloride SA 20 MEQ tablet Commonly known as:  K-DUR,KLOR-CON Take 1 tablet (20 mEq total) by mouth daily.   warfarin 5 MG tablet Commonly known as:  COUMADIN Take as directed. If you are unsure how to take this medication, talk to your nurse or doctor. Original instructions:  Take 1 tablet daily except on Thursdays take 1 and 1/2 tablets. What changed:    how much to take  how to take this  when to take this  additional instructions       Allergies  Allergen Reactions  . Miralax [Polyethylene Glycol] Swelling and Rash    Took [facility] brand developed rash. Patient states she tolerated name brand MiraLax in the past.  09/01/15.  Patient states she had diffuse swelling including swelling of her lips.   . Vancomycin Rash and Shortness Of Breath  . Acetaminophen Hives  . Oxycodone-Acetaminophen Itching  . Sulfa Antibiotics Rash    All over rash  . Sulfacetamide Sodium Rash    All over rash  . Sulfasalazine Rash    All over rash All over rash  . Banana Other (See Comments)    Unknown  . Latex Rash  . Tape Rash    Consultations: Surgery  Procedures/Studies: Ct Abdomen Pelvis Wo Contrast  Result Date: 07/24/2018 CLINICAL DATA:  Abdominal pain.  History of diverticulitis. EXAM: CT ABDOMEN AND PELVIS WITHOUT CONTRAST TECHNIQUE: Multidetector CT imaging of the abdomen and pelvis was performed following the standard protocol without IV contrast. COMPARISON:  CT abdomen pelvis dated Apr 10, 2016. FINDINGS: Lower chest: No acute abnormality. New 9 x 5 mm pulmonary nodule in the left lower lobe (series 4, image 13). Hepatobiliary: Hepatic steatosis. No focal liver abnormality. Small layering gallstones. No gallbladder wall thickening or biliary dilatation. Pancreas: Unremarkable. No pancreatic ductal dilatation or  surrounding inflammatory changes. Spleen: Normal in size without focal abnormality. Adrenals/Urinary Tract: The adrenal glands are unremarkable. Unchanged bilateral renal simple cysts. Bilateral hilar renal vascular calcifications. Punctate nonobstructive calculi in both kidneys. Unchanged chronic mild right hydronephrosis with decompressed right ureter. The bladder is unremarkable. Stomach/Bowel: Moderate colonic diverticulosis. Large amount of stool within the sigmoid colon with moderate wall thickening and prominent surrounding inflammatory changes. Focal disruption of the anterior sigmoid colonic wall (series 2, image 62 with adjacent 2.0 x 6.8 x 5.4 cm gas and stool containing collection. The colonic wall defect measures approximately 1.6 cm. The stomach and small bowel are unremarkable.  No obstruction.  Vascular/Lymphatic: Interval enlargement of the infrarenal abdominal aortic aneurysm, now measuring 3.8 cm, previously 3.3 cm. Aortoiliac atherosclerosis. Multiple reactive mesenteric lymph nodes in the pelvis. No enlarged abdominal or pelvic lymph nodes. Reproductive: Multiple calcified uterine fibroids.  No adnexal mass. Other: Small amount of free fluid in the mesentery and pelvis. Multiple small foci of pneumoperitoneum near the site of sigmoid colonic perforation and in the mesentery. Musculoskeletal: No acute or significant osseous findings. IMPRESSION: 1. Large amount of stool within the sigmoid colon with moderate wall thickening and prominent surrounding inflammatory changes with focal perforation of the anterior sigmoid colonic wall with adjacent 2.0 x 6.8 x 5.4 cm gas and stool containing collection. Additional small foci of pneumoperitoneum and free fluid near the site of perforation and within the mesentery. 2. Etiology of underlying sigmoid colonic inflammation could be related to stercoral colitis or diverticulitis. 3. New 7 mm aggregate pulmonary nodule in the left lower lobe. Non-contrast chest CT at 6-12 months is recommended. If the nodule is stable at time of repeat CT, then future CT at 18-24 months (from today's scan) is considered optional for low-risk patients, but is recommended for high-risk patients. This recommendation follows the consensus statement: Guidelines for Management of Incidental Pulmonary Nodules Detected on CT Images: From the Fleischner Society 2017; Radiology 2017; 284:228-243. 4. Enlargement of the infrarenal abdominal aortic aneurysm, now measuring 3.8 cm. Recommend followup by ultrasound in 2 years. This recommendation follows ACR consensus guidelines: White Paper of the ACR Incidental Findings Committee II on Vascular Findings. J Am Coll Radiol 2013; 10:789-794. 5.  Aortic atherosclerosis (ICD10-I70.0). 6. Hepatic steatosis. 7. Cholelithiasis. 8. Bilateral  nonobstructive nephrolithiasis. 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 Critical Value/emergent results were called by telephone at the time of interpretation on 07/24/2018 at 11:36 am to Dr. Noemi Chapel, who verbally acknowledged these results. Electronically Signed   By: Titus Dubin M.D.   On: 07/24/2018 11:44   Dg Abd 1 View  Result Date: 08/03/2018 CLINICAL DATA:  Ileus, recent colectomy EXAM: ABDOMEN - 1 VIEW COMPARISON:  08/02/2018 abdominal radiograph FINDINGS: Surgical drain terminates in the left pelvis. Surgical clip overlies the paraspinal right abdomen. Coarsely calcified uterine fibroids are seen overlying the sacrum. Mildly dilated small bowel loops in the left abdomen are not appreciably changed. No evidence of pneumatosis or pneumoperitoneum. No radiopaque nephrolithiasis. IMPRESSION: Stable mildly dilated small bowel loops in the left abdomen compatible with adynamic ileus in the postoperative state. Electronically Signed   By: Ilona Sorrel M.D.   On: 08/03/2018 07:42   Dg Abd 1 View  Result Date: 08/02/2018 CLINICAL DATA:  Ileus EXAM: ABDOMEN - 1 VIEW COMPARISON:  07/31/2018 FINDINGS: Catheter again noted in the pelvis. Moderate amount of small bowel gas with dilatation. Ostomy in the left mid abdomen. Findings are consistent with either partial small-bowel obstruction  or ileus. IMPRESSION: Similar pattern moderate small bowel gas and dilatation that could either be ileus or partial small bowel obstruction. Electronically Signed   By: Nelson Chimes M.D.   On: 08/02/2018 08:31   Dg Chest Port 1 View  Result Date: 07/27/2018 CLINICAL DATA:  Respiratory failure. EXAM: PORTABLE CHEST 1 VIEW COMPARISON:  07/27/2018 and prior radiographs FINDINGS: The cardiomediastinal silhouette is unchanged. An endotracheal tube with tip 5.5 cm above the carina, NG tube entering the stomach and RIGHT IJ central venous catheter with tip overlying the LOWER SVC again  noted. Bilateral LOWER lung atelectasis/airspace disease, LEFT-greater-than-RIGHT, again noted. Small LEFT effusion again noted. There is no evidence of pneumothorax. IMPRESSION: Little significant change with support apparatus as described. Electronically Signed   By: Margarette Canada M.D.   On: 07/27/2018 11:34   Dg Chest Port 1 View  Result Date: 07/27/2018 CLINICAL DATA:  Endotracheal tube EXAM: PORTABLE CHEST 1 VIEW COMPARISON:  1 day prior FINDINGS: Endotracheal tube terminates 7.0 cm above carina.There may be an overlying esophageal probe. Nasogastric tube extends beyond the inferior aspect of the film. Right internal jugular line tip at low SVC. Midline trachea. Normal heart size. Atherosclerosis in the transverse aorta. Small left pleural effusion persists. No pneumothorax. Left greater than right base airspace disease. IMPRESSION: Persistent left greater than right base airspace disease, favoring atelectasis. Especially at the left lung base, pneumonia cannot be excluded. Endotracheal tube borderline high in position, 7.0 cm above carina. This could be advanced 2-3 cm or re-evaluated at follow-up. Aortic Atherosclerosis (ICD10-I70.0). Small left pleural effusion. Electronically Signed   By: Abigail Miyamoto M.D.   On: 07/27/2018 07:40   Dg Chest Port 1 View  Result Date: 07/26/2018 CLINICAL DATA:  Respiratory failure EXAM: PORTABLE CHEST 1 VIEW COMPARISON:  07/25/2018 FINDINGS: Two catheters overlying the UPPER midline are noted, 1 with tip overlying the RIGHT mainstem bronchus and the other 6 cm above the carina. It is difficult to determine which of these represents the endotracheal tube. A RIGHT IJ central venous catheter with tip overlying the LOWER SVC and NG tube entering the stomach again noted. Mild bibasilar atelectasis identified, LEFT-greater-than-RIGHT. No pneumothorax. Cardiomediastinal silhouette is stable. IMPRESSION: Two catheters overlying the UPPER midline, difficult to determine which one  represents the endotracheal tube. One lies with tip overlying the RIGHT mainstem bronchus and the other tip lies 6 cm above the carina. Consider repeat imaging with external tubing moved to the side. Otherwise unchanged appearance the chest with mild bibasilar atelectasis, LEFT-greater-than-RIGHT. Electronically Signed   By: Margarette Canada M.D.   On: 07/26/2018 12:00   Dg Chest Port 1 View  Result Date: 07/25/2018 CLINICAL DATA:  Endotracheal tube and orogastric tube placement. EXAM: PORTABLE CHEST 1 VIEW COMPARISON:  07/24/2018 and 08/31/2015. FINDINGS: 1350 hours. Endotracheal tube tip is in the mid trachea. Right IJ central venous catheter extends to the level of the mid SVC. Enteric tube extends to the level of the proximal stomach, side hole at the level of the distal esophagus. The heart size and mediastinal contours are stable with aortic atherosclerosis. There are lower lung volumes with increased patchy left basilar airspace disease. No edema, pneumothorax or significant pleural effusion. IMPRESSION: Support system positioned as above. The enteric tube should be advanced further into the stomach. Increased left lower lobe atelectasis or infiltrate. Electronically Signed   By: Richardean Sale M.D.   On: 07/25/2018 14:00   Dg Chest Portable 1 View  Result Date: 07/24/2018 CLINICAL DATA:  History of breast carcinoma.  Upper abdominal pain EXAM: PORTABLE CHEST 1 VIEW COMPARISON:  June 30, 2015 FINDINGS: Upright image obtained. Lungs are clear. Heart is upper normal in size with pulmonary vascularity normal. No adenopathy. There is aortic atherosclerosis. No evident bone lesions. No pneumoperitoneum is demonstrated on this study. IMPRESSION: No edema or consolidation. No demonstrable pneumoperitoneum. There is aortic atherosclerosis. Aortic Atherosclerosis (ICD10-I70.0). Electronically Signed   By: Lowella Grip III M.D.   On: 07/24/2018 09:57   Dg Abd Portable 1v  Result Date: 07/31/2018 CLINICAL  DATA:  Abdominal pain EXAM: PORTABLE ABDOMEN - 1 VIEW COMPARISON:  07/25/2018 FINDINGS: Gastric catheter has been removed in the interval. The stomach is distended with air. Scattered large and small bowel gas is noted with slight prominence of the small bowel in the left abdomen. Ostomy is noted in the mid left abdomen as well. Degenerative changes of lumbar spine are seen. Surgical drain is noted inferiorly. IMPRESSION: Mild prominence of the small bowel in the left mid abdomen which may represent a mild postoperative ileus. Surgical drain in satisfactory position. Electronically Signed   By: Inez Catalina M.D.   On: 07/31/2018 08:48   Dg Abd Portable 1v  Result Date: 07/25/2018 CLINICAL DATA:  OG tube placement EXAM: PORTABLE ABDOMEN - 1 VIEW COMPARISON:  07/25/2018 FINDINGS: Esophageal tube tip and side port overlie the proximal stomach. Airspace disease at the left base. IMPRESSION: Esophageal tube tip and side-port project over the proximal stomach. Electronically Signed   By: Donavan Foil M.D.   On: 07/25/2018 16:12       Subjective:  Patient seen and examined the bedside this afternoon.  Remains comfortable.  Stable for discharge to CIR today. Discharge Exam: Vitals:   08/05/18 2233 08/06/18 0549  BP: 117/72 (!) 119/55  Pulse: 92 86  Resp: 18 17  Temp: (!) 97.5 F (36.4 C) (!) 97.5 F (36.4 C)  SpO2: 96% 98%   Vitals:   08/05/18 0446 08/05/18 1318 08/05/18 2233 08/06/18 0549  BP: (!) 145/71 (!) 121/58 117/72 (!) 119/55  Pulse: 91 88 92 86  Resp: 17 14 18 17   Temp: 97.7 F (36.5 C) 98.3 F (36.8 C) (!) 97.5 F (36.4 C) (!) 97.5 F (36.4 C)  TempSrc: Oral  Oral   SpO2: 96% 95% 96% 98%  Weight:      Height:        General: Pt is alert, awake, not in acute distress Cardiovascular: RRR, S1/S2 +, no rubs, no gallops Respiratory: CTA bilaterally, no wheezing, no rhonchi Abdominal: Soft, NT, ND, bowel sounds +, clean surgical wound, colostomy Extremities: no edema, no  cyanosis    The results of significant diagnostics from this hospitalization (including imaging, microbiology, ancillary and laboratory) are listed below for reference.     Microbiology: No results found for this or any previous visit (from the past 240 hour(s)).   Labs: BNP (last 3 results) No results for input(s): BNP in the last 8760 hours. Basic Metabolic Panel: Recent Labs  Lab 07/30/18 1514  07/31/18 0934 08/01/18 0200 08/02/18 0227 08/03/18 0353 08/03/18 1338 08/04/18 0224 08/05/18 0546 08/06/18 0330  NA 134*  --  131* 130* 128* 127*  --  126* 129* 128*  K 3.9  --  3.4* 3.8 4.0 3.9  --  4.1 3.8 4.0  CL 100  --  100 101 100 100  --  98 102 102  CO2 27  --  26 25 23  20*  --  22  21* 20*  GLUCOSE 165*  --  119* 96 86 93  --  103* 103* 80  BUN 16  --  14 10 8  6*  --  7* 7* 7*  CREATININE 0.57  --  0.46 0.43* 0.45 0.42*  --  0.48 0.39* 0.46  CALCIUM 8.3*  --  8.4* 8.2* 8.3* 8.1*  --  8.3* 8.4* 8.5*  MG  --    < > 1.6* 1.6* 1.7 1.6* 2.2 1.7 1.8 1.5*  PHOS 2.9  --  2.3*  2.4* 2.4* 2.5  2.5 2.5  2.4*  --   --   --   --    < > = values in this interval not displayed.   Liver Function Tests: Recent Labs  Lab 07/30/18 1514 07/31/18 0934 08/01/18 0200 08/02/18 0227 08/03/18 0353  ALBUMIN 1.6* 1.6* 1.5* 1.5* 1.5*   No results for input(s): LIPASE, AMYLASE in the last 168 hours. No results for input(s): AMMONIA in the last 168 hours. CBC: Recent Labs  Lab 08/01/18 0200 08/02/18 0227 08/03/18 0353 08/04/18 0224 08/06/18 0330  WBC 9.4 9.6 8.2 7.8 5.8  HGB 8.9* 8.5* 8.8* 9.1* 8.1*  HCT 27.5* 26.9* 27.1* 26.7* 24.9*  MCV 85.7 87.3 85.8 86.1 85.3  PLT 216 252 251 401* 329   Cardiac Enzymes: No results for input(s): CKTOTAL, CKMB, CKMBINDEX, TROPONINI in the last 168 hours. BNP: Invalid input(s): POCBNP CBG: Recent Labs  Lab 08/05/18 1202 08/05/18 1703 08/05/18 2232 08/06/18 0811 08/06/18 1155  GLUCAP 100* 99 83 68* 149*   D-Dimer No results for  input(s): DDIMER in the last 72 hours. Hgb A1c No results for input(s): HGBA1C in the last 72 hours. Lipid Profile No results for input(s): CHOL, HDL, LDLCALC, TRIG, CHOLHDL, LDLDIRECT in the last 72 hours. Thyroid function studies No results for input(s): TSH, T4TOTAL, T3FREE, THYROIDAB in the last 72 hours.  Invalid input(s): FREET3 Anemia work up No results for input(s): VITAMINB12, FOLATE, FERRITIN, TIBC, IRON, RETICCTPCT in the last 72 hours. Urinalysis    Component Value Date/Time   COLORURINE YELLOW 09/01/2015 1800   APPEARANCEUR Cloudy (A) 06/30/2018 1345   LABSPEC 1.025 09/01/2015 1800   PHURINE 6.0 09/01/2015 1800   GLUCOSEU Negative 06/30/2018 1345   GLUCOSEU NEGATIVE 04/05/2008 0833   HGBUR SMALL (A) 09/01/2015 1800   BILIRUBINUR Negative 06/30/2018 1345   KETONESUR NEGATIVE 09/01/2015 1800   PROTEINUR Negative 06/30/2018 1345   PROTEINUR TRACE (A) 09/01/2015 1800   UROBILINOGEN negative 10/25/2015 0926   UROBILINOGEN 0.2 09/01/2015 1800   NITRITE Positive (A) 06/30/2018 1345   NITRITE NEGATIVE 09/01/2015 1800   LEUKOCYTESUR 1+ (A) 06/30/2018 1345   Sepsis Labs Invalid input(s): PROCALCITONIN,  WBC,  LACTICIDVEN Microbiology No results found for this or any previous visit (from the past 240 hour(s)).  Please note: You were cared for by a hospitalist during your hospital stay. Once you are discharged, your primary care physician will handle any further medical issues. Please note that NO REFILLS for any discharge medications will be authorized once you are discharged, as it is imperative that you return to your primary care physician (or establish a relationship with a primary care physician if you do not have one) for your post hospital discharge needs so that they can reassess your need for medications and monitor your lab values.    Time coordinating discharge: 40 minutes  SIGNED:   Shelly Coss, MD  [facility] 08/06/2018, 1:22 PM Pager  [phone]  If 7PM-7AM, please contact night-coverage  [email or web address] Password [email or web address]

## 2018-08-07 ENCOUNTER — Inpatient Hospital Stay (HOSPITAL_COMMUNITY): Payer: Medicare Other | Admitting: Physical Therapy

## 2018-08-07 ENCOUNTER — Inpatient Hospital Stay (HOSPITAL_COMMUNITY): Payer: Medicare Other | Admitting: Occupational Therapy

## 2018-08-07 ENCOUNTER — Inpatient Hospital Stay (HOSPITAL_COMMUNITY): Payer: Medicare Other | Admitting: Speech Pathology

## 2018-08-07 DIAGNOSIS — E871 Hypo-osmolality and hyponatremia: Secondary | ICD-10-CM

## 2018-08-07 DIAGNOSIS — R5381 Other malaise: Principal | ICD-10-CM

## 2018-08-07 DIAGNOSIS — Z9049 Acquired absence of other specified parts of digestive tract: Secondary | ICD-10-CM

## 2018-08-07 LAB — COMPREHENSIVE METABOLIC PANEL
ALBUMIN: 1.5 g/dL — AB (ref 3.5–5.0)
ALT: 12 U/L (ref 0–44)
AST: 20 U/L (ref 15–41)
Alkaline Phosphatase: 74 U/L (ref 38–126)
Anion gap: 5 (ref 5–15)
BUN: 6 mg/dL — ABNORMAL LOW (ref 8–23)
CO2: 22 mmol/L (ref 22–32)
CREATININE: 0.37 mg/dL — AB (ref 0.44–1.00)
Calcium: 8.3 mg/dL — ABNORMAL LOW (ref 8.9–10.3)
Chloride: 105 mmol/L (ref 98–111)
GFR calc Af Amer: >60 mL/min (ref >60)
GLUCOSE: 98 mg/dL (ref 70–99)
Potassium: 3.5 mmol/L (ref 3.5–5.1)
Sodium: 132 mmol/L — ABNORMAL LOW (ref 135–145)
Total Bilirubin: 0.6 mg/dL (ref 0.3–1.2)
Total Protein: 4.9 g/dL — ABNORMAL LOW (ref 6.5–8.1)

## 2018-08-07 LAB — CBC WITH DIFFERENTIAL/PLATELET
ABS IMMATURE GRANULOCYTES: 0.1 10*3/uL (ref 0.0–0.1)
Basophils Absolute: 0 10*3/uL (ref 0.0–0.1)
Basophils Relative: 1 %
Eosinophils Absolute: 0 10*3/uL (ref 0.0–0.7)
Eosinophils Relative: 1 %
HCT: 26.3 % — ABNORMAL LOW (ref 36.0–46.0)
Hemoglobin: 8.4 g/dL — ABNORMAL LOW (ref 12.0–15.0)
IMMATURE GRANULOCYTES: 1 %
Lymphocytes Relative: 14 %
Lymphs Abs: 0.7 10*3/uL (ref 0.7–4.0)
MCH: 27.3 pg (ref 26.0–34.0)
MCHC: 31.9 g/dL (ref 30.0–36.0)
MCV: 85.4 fL (ref 78.0–100.0)
Monocytes Absolute: 0.5 10*3/uL (ref 0.1–1.0)
Monocytes Relative: 10 %
NEUTROS ABS: 3.7 10*3/uL (ref 1.7–7.7)
NEUTROS PCT: 73 %
Platelets: 349 10*3/uL (ref 150–400)
RBC: 3.08 MIL/uL — AB (ref 3.87–5.11)
RDW: 18.6 % — ABNORMAL HIGH (ref 11.5–15.5)
WBC: 5 10*3/uL (ref 4.0–10.5)

## 2018-08-07 LAB — GLUCOSE, CAPILLARY
GLUCOSE-CAPILLARY: 81 mg/dL (ref 70–99)
Glucose-Capillary: 95 mg/dL (ref 70–99)
Glucose-Capillary: 130 mg/dL — ABNORMAL HIGH (ref 70–99)
Glucose-Capillary: 89 mg/dL (ref 70–99)

## 2018-08-07 LAB — PROTIME-INR
INR: 1.47
PROTHROMBIN TIME: 17.7 s — AB (ref 11.4–15.2)

## 2018-08-07 LAB — HEPARIN LEVEL (UNFRACTIONATED): HEPARIN UNFRACTIONATED: 0.47 [IU]/mL (ref 0.30–0.70)

## 2018-08-07 MED ORDER — WARFARIN SODIUM 5 MG PO TABS
5.0000 mg | ORAL_TABLET | Freq: Once | ORAL | Status: AC
  Administered 2018-08-07: 5 mg via ORAL
  Filled 2018-08-07: qty 1

## 2018-08-07 MED ORDER — POTASSIUM CHLORIDE CRYS ER 20 MEQ PO TBCR
20.0000 meq | EXTENDED_RELEASE_TABLET | Freq: Once | ORAL | Status: AC
  Administered 2018-08-07: 20 meq via ORAL
  Filled 2018-08-07: qty 1

## 2018-08-07 NOTE — Progress Notes (Signed)
[place] for Heparin >> warfarin Indication: atrial fibrillation  Allergies  Allergen Reactions  . Miralax [Polyethylene Glycol] Swelling and Rash    Took [facility] brand developed rash. Patient states she tolerated name brand MiraLax in the past.  09/01/15. Patient states she had diffuse swelling including swelling of her lips.   . Vancomycin Rash and Shortness Of Breath  . Acetaminophen Hives  . Oxycodone-Acetaminophen Itching  . Sulfa Antibiotics Rash    All over rash  . Sulfacetamide Sodium Rash    All over rash  . Sulfasalazine Rash    All over rash All over rash  . Banana Other (See Comments)    Unknown  . Latex Rash  . Tape Rash   Patient Measurements: Height: 5\' 8"  (172.7 cm) Weight: 197 lb 15.6 oz (89.8 kg) IBW/kg (Calculated) : 63.9 Heparin Dosing Weight: 76 kg  Vital Signs: Temp: 98.1 F (36.7 C) (08/29 0534) Temp Source: Oral (08/29 0534) BP: 150/80 (08/29 0534) Pulse Rate: 93 (08/29 0534)  Labs: Recent Labs    08/05/18 0546 08/06/18 0330 08/07/18 0700  HGB  --  8.1* 8.4*  HCT  --  24.9* 26.3*  PLT  --  329 349  LABPROT 15.8* 17.1* 17.7*  INR 1.27 1.41 1.47  HEPARINUNFRC 0.59 0.29* 0.47  CREATININE 0.39* 0.46 0.37*   Estimated Creatinine Clearance: 72.4 mL/min (A) (by C-G formula based on SCr of 0.37 mg/dL (L)).  Assessment: 82 yoF on warfarin and dofetilide PTA for afib. Admitted on 8/15 with abdominal pain and found to have bowel perforation now s/p colectomy and colostomy placement. Transferred to the ICU postoperatively. Heparin started 8/19 post op for anticoagulation in place of PTA warfarin.   Warfarin resumed 8/26. PTA regimen: 5 mg/day  Heparin level therapeutic at 0.47 with increase to 1550 units/hr.   INR remains subtherapeutic at 1.47 H/H low but stable, plts wnl, and no bleeding noted  Goal of Therapy:  INR 2-3 Heparin level 0.3-0.5 Monitor platelets by anticoagulation protocol: Yes   Plan:   Continue heparin gtt at 1550 units/hr Warfarin 5mg  PO x 1 tonight Daily INR, heparin level, CBC, s/s bleeding  Bertis Ruddy, PharmD Clinical Pharmacist Please check AMION for all [facility] numbers 08/07/2018 8:49 AM

## 2018-08-07 NOTE — Care Management (Signed)
[facility] Individual Statement of Services  Patient Name:  Melody Casey  Date:  08/07/2018  Welcome to the [facility].  Our goal is to provide you with an individualized program based on your diagnosis and situation, designed to meet your specific needs.  With this comprehensive rehabilitation program, you will be expected to participate in at least 3 hours of rehabilitation therapies Monday-Friday, with modified therapy programming on the weekends.  Your rehabilitation program will include the following services:  Physical Therapy (PT), Occupational Therapy (OT), 24 hour per day rehabilitation nursing, Therapeutic Recreaction (TR), Neuropsychology, Case Management (Social Worker), Rehabilitation Medicine, Nutrition Services and Pharmacy Services  Weekly team conferences will be held on Tuesdays to discuss your progress.  Your Social Worker will talk with you frequently to get your input and to update you on team discussions.  Team conferences with you and your family in attendance may also be held.  Expected length of stay: 17-21 days   Overall anticipated outcome: supervision  Depending on your progress and recovery, your program may change. Your Social Worker will coordinate services and will keep you informed of any changes. Your Social Worker's name and contact numbers are listed  below.  The following services may also be recommended but are not provided by the [facility] will be made to provide these services after discharge if needed.  Arrangements include referral to agencies that provide these services.  Your insurance has been verified to be:  Medicare Your primary doctor is:  Building control surveyor  Pertinent information will be shared with your doctor and your insurance company.  Social Worker:  [place], [place] or ([phone]   Information discussed with and copy given to patient by: Lennart Pall, 08/07/2018, 3:05 PM

## 2018-08-07 NOTE — Progress Notes (Signed)
Pt family requested ostomy bad be emptied. Asked daughter and husband if was interested in being educated on emptying bag. Husband was willing to learn. Nurse demonstrated and watched husband through steps to emptying and instructed to only allow bag to be 1/3 to 1/2 full per [place]. Husband understood teaching and didn't have further questions. Also instructed per [place] nurse, she would come by tomorrow to do family ed with both and show wound vac placement and ostomy change. Pt resting in bed with call bell in reach. Will cont to monitor.   Erie Noe, LPN 29/24/46 28:63 PM

## 2018-08-07 NOTE — Progress Notes (Signed)
Social Work  Social Work Assessment and Plan  Patient Details  Name: Melody Casey MRN: [id number] Date of Birth: Jan 13, 1946  Today's Date: 08/07/2018  Problem List:  Patient Active Problem List   Diagnosis Date Noted  . Debility 08/06/2018  . Acute blood loss anemia   . PAF (paroxysmal atrial fibrillation) ([place])   . Anxiety state   . Sepsis ([place]) 07/24/2018  . Acute diverticulitis of intestine 07/24/2018  . Bowel perforation ([place]) 07/24/2018  . Hyponatremia 07/24/2018  . Osteoporosis 08/27/2017  . Low vitamin D level 06/04/2017  . Frequent UTI 10/25/2015  . Microcytic anemia 08/30/2015  . Diverticulosis 08/30/2015  . Hemangioma   . TIA (transient ischemic attack) 08/13/2015  . Atrial fibrillation with rapid ventricular response ([place])   . Rheumatoid arthritis ([place]) 06/20/2015  . AAA (abdominal aortic aneurysm) without rupture ([place]) 01/28/2015  . Persistent atrial fibrillation ([place]) 11/17/2014  . History of scleroderma 02/17/2014  . Malignant neoplasm of upper-outer quadrant of right breast in female, estrogen receptor positive ([place]) 02/01/2014  . Anxiety and depression 12/07/2011  . High risk medication use 06/14/2011  . CAD (coronary artery disease)   . Type 2 diabetes mellitus with complication, without long-term current use of insulin ([place]) 06/07/2008  . HLD (hyperlipidemia) 07/22/2007  . Essential hypertension 07/22/2007   Past Medical History:  Past Medical History:  Diagnosis Date  . AAA (abdominal aortic aneurysm) ([place])   . Acute diverticulitis   . Anxiety   . Arthritis   . Atrial fibrillation ([place])    a. Dx 03/2011 - on tikosyn/coumadin.  Mar[place] Kitchen CAD (coronary artery disease)    a. NSTEMI 02/2011: occ mid Cx, DES to OM2, residual nonobst LAD dz.  . Cancer ([place])    breast  . Diabetes mellitus   . Diverticulitis   . Diverticulitis of colon     2008, 04/2011, 12/2014, 08/2015  . 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 Hemangioma    liver  . HLD  (hyperlipidemia)   . HTN (hypertension)   . Osteoporosis   . Tobacco abuse    stopped smoking 2012  . Ureteral obstruction    History of gross hematuria/right hydronephrosis 2/2 to uteropelvic junction obstruction, s/p cystoscopy in January 2007 with bilateral retrograde pyelography, right ureter arthroscopy, right ureteral stent placement, bladder biopsies, stent removal since then.  . Wears dentures    top   Past Surgical History:  Past Surgical History:  Procedure Laterality Date  . BIOPSY N/A 11/01/2015   Procedure: BIOPSY;  Surgeon: Danie Binder, MD;  Location: [facility];  Service: Endoscopy;  Laterality: N/A;  . BREAST LUMPECTOMY WITH NEEDLE LOCALIZATION AND AXILLARY SENTINEL LYMPH NODE BX Right 03/01/2014   Procedure: BREAST LUMPECTOMY WITH NEEDLE LOCALIZATION AND AXILLARY SENTINEL LYMPH NODE BX;  Surgeon: Edward Jolly, MD;  Location: [facility];  Service: General;  Laterality: Right;  . BREAST SURGERY    . CARDIAC CATHETERIZATION  2012   stent  . COLON RESECTION N/A 07/25/2018   Procedure: COLOSTOMY;  Surgeon: Rolm Bookbinder, MD;  Location: [facility];  Service: General;  Laterality: N/A;  . COLONOSCOPY WITH PROPOFOL N/A 06/29/2014   Dr. Wynetta Emery: universal diverticulosis  . CORONARY STENT PLACEMENT  2012  . ESOPHAGOGASTRODUODENOSCOPY (EGD) WITH PROPOFOL N/A 11/01/2015   Procedure: ESOPHAGOGASTRODUODENOSCOPY (EGD) WITH PROPOFOL;  Surgeon: Danie Binder, MD;  Location: [facility];  Service: Endoscopy;  Laterality: N/A;  . KNEE ARTHROSCOPY     left  . LAPAROTOMY N/A 07/25/2018  Procedure: EXPLORATORY LAPAROTOMY;  Surgeon: Rolm Bookbinder, MD;  Location: [facility];  Service: General;  Laterality: N/A;  . PARTIAL COLECTOMY N/A 07/25/2018   Procedure: COLECTOMY;  Surgeon: Rolm Bookbinder, MD;  Location: [facility];  Service: General;  Laterality: N/A;  . Right leg surgery  age 74   As a child for  ?scleroderma per patient  . TONSILLECTOMY AND ADENOIDECTOMY    . TUBAL  LIGATION    . Ureteral surgery  2011   rt ureterostomy-stent   Social History:  reports that she quit smoking about 7 years ago. Her smoking use included cigarettes. She has a 100.00 pack-year smoking history. She has never used smokeless tobacco. She reports that she does not drink alcohol or use drugs.  Family / Support Systems Marital Status: Married How Long?: 20+ yrs (2nd marriage) Patient Roles: Spouse, Parent Spouse/Significant Other: Lauran Romanski, spouse -  Children: Patient has 3 adult children all living in [place].  Daughter, Hali Marry @ [phone];  son, Randa Spike @ [phone] Other Supports: friend, Drake Leach @ [phone] -lives close to patient it is a Marine scientist Anticipated Caregiver: spouse, [name] may returen to [place] to asisst if available Ability/Limitations of Caregiver: Children all live out of state.  Patient notes spouse with past history of throat cancer, however, independent overall and notes she does not have to provide him any assistance. Caregiver Availability: 24/7 Family Dynamics: Daughter currently down from [place] but does plan to return as she is able to assist.  Social History Preferred language: English Religion: None Cultural Background: None Read: Yes Write: Yes Employment Status: Retired Freight forwarder Issues: none Guardian/Conservator: none - per MD, pt is capable of making decisions on her own behalf   Abuse/Neglect Abuse/Neglect Assessment Can Be Completed: Yes Physical Abuse: Denies Verbal Abuse: Denies Sexual Abuse: Denies Exploitation of patient/patient's resources: Denies Self-Neglect: Denies  Emotional Status Pt's affect, behavior adn adjustment status: Patient sitting up in bed and reports that she is very fatigued from full day of therapies.  Scowls as I enter the room, however, laughs when I explained that I am not a therapist.  She completes assessment interview without any difficulty.  She admits  frustration with her current medical and care needs and facing life having to care for her colostomy.  She denies any significant emotional distress, however, feel she may benefit from neuropsychology heart to encourage independence and better coping skills. Recent Psychosocial Issues: Patient notes she was caregiver for her husband as he went through treatments for his throat cancer. Pyschiatric History: Patient reports "a little anxiety after my heart surgery".  No formal diagnosis or treatment. Substance Abuse History: None  Patient / Family Perceptions, Expectations & Goals Pt/Family understanding of illness & functional limitations: Patient and daughter were very good understanding of medical course with diverticulitis flare which resulted in need for colostomy.  Good understanding of her resulting debility and need for CIR. Premorbid pt/family roles/activities: Patient completely independent PTA.  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 Anticipated changes in roles/activities/participation: Patient states spouse will become her primary caregiver. Pt/family expectations/goals: "I just hope they do not expect too much from me."  [employer]: None Premorbid Home Care/DME Agencies: None Transportation available at discharge: Yes Resource referrals recommended: Neuropsychology  Discharge Planning Living Arrangements: Spouse/significant other Support Systems: Spouse/significant other, Children, Friends/neighbors Type of Residence: Private residence Insurance Resources: Chartered certified accountant Resources: Fish farm manager, Education officer, community  Screen Referred: No Living Expenses: Motgage Money Management: Spouse Does the patient have any problems obtaining your medications?: No Home Management: Patient and spouse share responsibilities for the home. Patient/Family Preliminary Plans: Patient to discharge home with  spouse as primary caregiver.  Local friend to assist intermittently and daughter to return as needed from [place] to assist.  I finished up a conference on Miguel Barrera salmonella back at the type of reports Social Work Anticipated Follow Up Needs: HH/OP Expected length of stay: 17-21 days  Clinical Impression Pleasant woman here following diverticulitis flare resulting in new colostomy and debilitated.  Patient admits much frustration about her medical situation and new colostomy.  May benefit from neuropsychology involvement while here.  Spouse to be primary caregiver and his adult children all live out of state.  Daughter currently at bedside but will be returning home in the next few days.  Social work to follow for support and discharge planning needs.  Lafonda Patron 08/07/2018, 3:09 PM

## 2018-08-07 NOTE — Progress Notes (Signed)
[facility] PHYSICAL MEDICINE & REHABILITATION     PROGRESS NOTE    Subjective/Complaints: Had a reasonable night. Abdominal/wound pain an issue.   ROS: Patient denies fever, rash, sore throat, blurred vision, nausea, vomiting, diarrhea, cough, shortness of breath or chest pain, joint or back pain, headache, or mood change.    Objective:  No results found. Recent Labs    08/06/18 0330 08/07/18 0700  WBC 5.8 5.0  HGB 8.1* 8.4*  HCT 24.9* 26.3*  PLT 329 349   Recent Labs    08/06/18 0330 08/07/18 0700  NA 128* 132*  K 4.0 3.5  CL 102 105  GLUCOSE 80 98  BUN 7* 6*  CREATININE 0.46 0.37*  CALCIUM 8.5* 8.3*   CBG (last 3)  Recent Labs    08/06/18 1723 08/06/18 2135 08/07/18 0657  GLUCAP 161* 135* 95    Wt Readings from Last 3 Encounters:  08/06/18 89.8 kg  08/04/18 75.3 kg  07/22/18 75.8 kg    No intake or output data in the 24 hours ending 08/07/18 0906  Vital Signs: Blood pressure (!) 150/80, pulse 93, temperature 98.1 F (36.7 C), temperature source Oral, resp. rate 16, height 5\' 8"  (1.727 m), weight 89.8 kg, SpO2 97 %. Physical Exam:  Constitutional: No distress . Vital signs reviewed. HEENT: EOMI, oral membranes moist Neck: supple Cardiovascular: RRR without murmur. No JVD    Respiratory: CTA Bilaterally without wheezes or rales. Normal effort    GI:  Colostomy in place.  Abdominal wound just dressed. Area tender Musculoskeletal:  Intrinsic minus left foot Right foot atrophy Some Left foot edema  Neurological: She is alert and oriented to person, place, and time.  Motor: Bilateral upper: 4/5 proximal distal Right lower extremity: Hip flexion, knee extension 3+/5, ankle contracture Left lower cervical and hip flexion, knee extension 2/5, ankle 2+/5  Skin: Skin is warm and dry.  Psychiatric: cooperative.   Assessment/Plan: 1. Functional deficits secondary to debility after colectomy which require 3+ hours per day of interdisciplinary therapy in  a comprehensive inpatient rehab setting. Physiatrist is providing close team supervision and 24 hour management of active medical problems listed below. Physiatrist and rehab team continue to assess barriers to discharge/monitor patient progress toward functional and medical goals.  Function:  Bathing Bathing position      Bathing parts      Bathing assist        Upper Body Dressing/Undressing Upper body dressing                    Upper body assist        Lower Body Dressing/Undressing Lower body dressing                                  Lower body assist        Toileting Toileting          Toileting assist     Transfers Chair/bed transfer             Locomotion Ambulation           Wheelchair          Cognition Comprehension    Expression    Social Interaction    Problem Solving    Memory     Medical Problem List and Plan: 1.  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  DVT Prophylaxis/Anticoagulation: Heparin bridge to chronic Coumadin resumed for history of atrial fibrillation 3. Pain Management: Ultram as needed 4. Mood: Xanax 0.25 mg 3 times daily as needed 5. Neuropsych: This patient is capable of making decisions on her own behalf.  -team to provide ego support as needed 6. Skin/Wound Care:    -  Midline wound dressings 3 times daily.    -Surgery to follow-up Friday to discuss possible need for wound VAC 7. Fluids/Electrolytes/Nutrition: encourae PO  -sodium trending up 132 8.  Acute blood loss anemia.  Follow-up CBC hgb up to 8.4 9.  Atrial fibrillation.  Cardizem 360 mg daily, Tikosyn 250 mg twice daily.  Cardiac rate controlled 10.  Hypertension.  Norvasc 2.5 mg daily 11.  Rheumatoid arthritis.  Plaquenil 200 mg twice daily 12.  Hyperlipidemia.  Lipitor   LOS  (Days) 1 A FACE TO FACE EVALUATION WAS PERFORMED  [name], MD 08/07/2018 9:06 AM

## 2018-08-07 NOTE — Consult Note (Signed)
[facility] Nurse ostomy follow up Stoma type/location: LLQ, end colostomy Stomal assessment/size: 2 1/4" round budded, pouch intact today at the time of my visit Output semiformed green  Ostomy pouching: 1pc.with barrier ring at 9 o'clock   Education provided:  Met with patient's daughter, patient is sleeping and does not open her eyes during my visit Discussed need to place NPWT dressing tomorrow and that this will require the pouch to be changed due to the proximity of the pouch to the wound. I had planned a teaching visit with her husband today, however we will change that to tomorrow to coordinate [facility] placement and pouch change/education all at the same time. Her daughter agreed with plan and will notified the patient's husband of this change.  Notified rehab scheduler of need for time from 10-11. Contacted [facility] to arrange PA or surgeon to meet me at 10 to assess the wound for VAC placement. Plan will be for M/W/F VAC dressing changes which will also require pouch changes at the same time until some closure and contraction of the wound bed.  Enrolled patient in [facility] Start Discharge program: Yes  Requested time block M/W/F for [facility] nurse team for dressing changes and pouch changes.   [place], [place], [place]

## 2018-08-07 NOTE — Evaluation (Signed)
Occupational Therapy Assessment and Plan  Patient Details  Name: Melody Casey MRN: [id number] Date of Birth: 09/21/1946  OT Diagnosis: acute pain, muscle weakness (generalized) and decreased activity tolerance. Rehab Potential: Rehab Potential (ACUTE ONLY): Good ELOS: 2.5-3 weeks   Today's Date: 08/07/2018  Session 1 OT Individual Time: 1200-1220 OT Individual Time Calculation (min): 20 min     Session 2 OT Individual Time: 1405-1445 OT Individual Time Calculation (min): 40 min     Problem List:  Patient Active Problem List   Diagnosis Date Noted  . Debility 08/06/2018  . Acute blood loss anemia   . PAF (paroxysmal atrial fibrillation) (HCC)   . Anxiety state   . Sepsis (HCC) 07/24/2018  . Acute diverticulitis of intestine 07/24/2018  . Bowel perforation (HCC) 07/24/2018  . Hyponatremia 07/24/2018  . Osteoporosis 08/27/2017  . Low vitamin D level 06/04/2017  . Frequent UTI 10/25/2015  . Microcytic anemia 08/30/2015  . Diverticulosis 08/30/2015  . Hemangioma   . TIA (transient ischemic attack) 08/13/2015  . Atrial fibrillation with rapid ventricular response (HCC)   . Rheumatoid arthritis (HCC) 06/20/2015  . AAA (abdominal aortic aneurysm) without rupture (HCC) 01/28/2015  . Persistent atrial fibrillation (HCC) 11/17/2014  . History of scleroderma 02/17/2014  . Malignant neoplasm of upper-outer quadrant of right breast in female, estrogen receptor positive (HCC) 02/01/2014  . Anxiety and depression 12/07/2011  . High risk medication use 06/14/2011  . CAD (coronary artery disease)   . Type 2 diabetes mellitus with complication, without long-term current use of insulin (HCC) 06/07/2008  . HLD (hyperlipidemia) 07/22/2007  . Essential hypertension 07/22/2007    Past Medical History:  Past Medical History:  Diagnosis Date  . AAA (abdominal aortic aneurysm) (HCC)   . Acute diverticulitis   . Anxiety   . Arthritis   . Atrial fibrillation (HCC)    a. Dx 03/2011 - on  tikosyn/coumadin.  . CAD (coronary artery disease)    a. NSTEMI 02/2011: occ mid Cx, DES to OM2, residual nonobst LAD dz.  . Cancer (HCC)    breast  . Diabetes mellitus   . Diverticulitis   . Diverticulitis of colon     2008, 04/2011, 12/2014, 08/2015  . 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 Hemangioma    liver  . HLD (hyperlipidemia)   . HTN (hypertension)   . Osteoporosis   . Tobacco abuse    stopped smoking 2012  . Ureteral obstruction    History of gross hematuria/right hydronephrosis 2/2 to uteropelvic junction obstruction, s/p cystoscopy in January 2007 with bilateral retrograde pyelography, right ureter arthroscopy, right ureteral stent placement, bladder biopsies, stent removal since then.  . Wears dentures    top   Past Surgical History:  Past Surgical History:  Procedure Laterality Date  . BIOPSY N/A 11/01/2015   Procedure: BIOPSY;  Surgeon: Sandi L Fields, MD;  Location: [facility];  Service: Endoscopy;  Laterality: N/A;  . BREAST LUMPECTOMY WITH NEEDLE LOCALIZATION AND AXILLARY SENTINEL LYMPH NODE BX Right 03/01/2014   Procedure: BREAST LUMPECTOMY WITH NEEDLE LOCALIZATION AND AXILLARY SENTINEL LYMPH NODE BX;  Surgeon: Benjamin T Hoxworth, MD;  Location: [facility];  Service: General;  Laterality: Right;  . BREAST SURGERY    . CARDIAC CATHETERIZATION  2012   stent  . COLON RESECTION N/A 07/25/2018   Procedure: COLOSTOMY;  Surgeon: Wakefield, Matthew, MD;  Location: [facility];  Service: General;  Laterality: N/A;  . COLONOSCOPY WITH PROPOFOL N/A 06/29/2014   Dr.   Johnson: universal diverticulosis  . CORONARY STENT PLACEMENT  2012  . ESOPHAGOGASTRODUODENOSCOPY (EGD) WITH PROPOFOL N/A 11/01/2015   Procedure: ESOPHAGOGASTRODUODENOSCOPY (EGD) WITH PROPOFOL;  Surgeon: Danie Binder, MD;  Location: [facility];  Service: Endoscopy;  Laterality: N/A;  . KNEE ARTHROSCOPY     left  . LAPAROTOMY N/A 07/25/2018   Procedure: EXPLORATORY LAPAROTOMY;  Surgeon: Rolm Bookbinder, MD;  Location: [place];  Service: General;  Laterality: N/A;  . PARTIAL COLECTOMY N/A 07/25/2018   Procedure: COLECTOMY;  Surgeon: Rolm Bookbinder, MD;  Location: [place];  Service: General;  Laterality: N/A;  . Right leg surgery  age 59   As a child for  ?scleroderma per patient  . TONSILLECTOMY AND ADENOIDECTOMY    . TUBAL LIGATION    . Ureteral surgery  2011   rt ureterostomy-stent    Assessment & Plan Clinical Impression: Patient is a 72 y.o. year old female with recent admission to the hospital on  07/24/18 with LLQ. 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 Course was complicated by shock, acidosis, hypotension and Afib. ETT 8/16-8/19; self-extubated. PMH: HTN, HLD, diverticulitis, DM, breast ca, CAD, afib, anxiety, AAA.  Patient transferred to CIR on 08/06/2018 .    Patient currently requires total/max A with basic self-care skills secondary to muscle weakness, decreased cardiorespiratoy endurance and decreased sitting balance, decreased standing balance, decreased postural control and decreased balance strategies.  Prior to hospitalization, patient could complete BADL with independent .  Patient will benefit from skilled intervention to increase independence with basic self-care skills prior to discharge home with care partner.  Anticipate patient will require 24 hour supervision and follow up home health.  OT - End of Session Endurance Deficit: Yes Endurance Deficit Description: fatigues quickly, multiple rest breaks  OT Assessment Rehab Potential (ACUTE ONLY): Good OT Patient demonstrates impairments in the following area(s): Balance;Endurance;Motor;Pain OT Basic ADL's Functional Problem(s): Eating;Grooming;Bathing;Dressing;Toileting OT Transfers Functional Problem(s): Tub/Shower;Toilet OT Additional Impairment(s): None OT Plan OT Intensity: Minimum of 1-2 x/day, 45 to 90  minutes OT Frequency: 5 out of 7 days OT Duration/Estimated Length of Stay: 2.5-3 weeks OT Treatment/Interventions: Balance/vestibular training;Community reintegration;Discharge planning;Disease mangement/prevention;DME/adaptive equipment instruction;Functional mobility training;Pain management;Patient/family education;Self Care/advanced ADL retraining;Skin care/wound managment;Therapeutic Activities;UE/LE Strength taining/ROM;Therapeutic Exercise;UE/LE Coordination activities;Wheelchair propulsion/positioning OT Self Feeding Anticipated Outcome(s): mod I OT Basic Self-Care Anticipated Outcome(s): Supervision OT Toileting Anticipated Outcome(s): Supervision OT Bathroom Transfers Anticipated Outcome(s): Supervision OT Recommendation Patient destination: Home Follow Up Recommendations: Home health OT Equipment Recommended: To be determined   Skilled Therapeutic Intervention Session 1 OT eval completed addressing rehab process, OT purpose, POC, ELOS, and goals.  Limited ADL assessment secondary to pt participation and fatigue. Pt requests for OT to return after she rests a while.   Session 2 Pt semi-reclined in bed upon OT arrival with daughter present. Pt more agreeable to OT treatment session but declined any bathing/dressing tasks this afternoon. Pt came to sitting EOB with mod A overall and increased time 2/2 abdominal pain. Stedy used for sit<>stand from raised bed with max A to lift and lower. OT checked ostomy bag and it was mostly empty. Educated on goals for pt to be able to care for her own ostomy eventually and pt agreed. When prompted, pt stated she did need to use the bathroom. Stedy used to transfer pt onto [facility] over toilet. Pt with continent void of bladder and able to complete peri-care with set-up. Pt then brought  to the sink in stedy and was able to sit and wash hands reaching outside base of support while seated. Pt reached max fatigue and declined further participation-requested to  return to bed despite encouragement to sit in wc or recliner. Pt needed assistance to lift BLEs back in the bed and was left semi-reclined in bed with needs met.   OT Evaluation Precautions/Restrictions  Precautions Precautions: Fall Precaution Comments: Colostomy  Restrictions Weight Bearing Restrictions: No  Pain Pain Assessment Pain Scale: 0-10 Pain Score: 4 Pain Type: Acute pain Pain Location: Abdomen Pain Descriptors / Indicators: Aching Pain Onset: On-going Patients Stated Pain Goal: 2 Pain Intervention(s): Repositioned  Home Living/Prior Functioning Home Living Available Help at Discharge: Family, Available 24 hours/day Type of Home: House Home Access: Stairs to enter Technical brewer of Steps: 3 Entrance Stairs-Rails: Right, Left, Can reach both Home Layout: One level Bathroom Shower/Tub: Multimedia programmer: Handicapped height Bathroom Accessibility: Yes Additional Comments: Pt has a shower chair,  Lives With: Spouse IADL History Homemaking Responsibilities: Yes Current License: Yes IADL Comments: Pt enjoys walking and resting, [place] Prior Function Level of Independence: Independent with basic ADLs, Independent with homemaking with ambulation Driving: Yes Comments: Enjoys walking, [place] ADL ADL ADL Comments: Please see functional navigator Vision Patient Visual Report: No change from baseline Cognition Overall Cognitive Status: Within Functional Limits for tasks assessed Arousal/Alertness: Awake/alert Orientation Level: Place;Person;Situation Person: Oriented Place: Oriented Situation: Oriented Year: 2019 Month: August Day of Week: Correct Memory: Appears intact Immediate Memory Recall: Bed;Sock;Blue Memory Recall: Blue;Sock;Bed Memory Recall Sock: Without Cue Memory Recall Blue: Without Cue Memory Recall Bed: With Cue Awareness: Appears intact Problem Solving: Appears intact Safety/Judgment: Appears  intact Sensation Sensation Light Touch: Appears Intact Proprioception: Appears Intact Coordination Gross Motor Movements are Fluid and Coordinated: Yes Fine Motor Movements are Fluid and Coordinated: Yes Motor  Motor Motor - Skilled Clinical Observations: Generalized weakness Mobility  Bed Mobility Bed Mobility: Rolling Right;Rolling Left Rolling Right: Moderate Assistance - Patient 50-74% Rolling Left: Moderate Assistance - Patient 50-74% Transfers Sit to Stand: 2 Helpers  Trunk/Postural Assessment  Cervical Assessment Cervical Assessment: (fwd head) Thoracic Assessment Thoracic Assessment: (kyphosis) Lumbar Assessment Lumbar Assessment: (posterior pelvic tilt) Postural Control Postural Control: Deficits on evaluation Righting Reactions: delayed  Balance Balance Balance Assessed: Yes Static Sitting Balance Static Sitting - Balance Support: Feet supported Static Sitting - Level of Assistance: 5: Stand by assistance Dynamic Sitting Balance Dynamic Sitting - Balance Support: During functional activity Dynamic Sitting - Level of Assistance: 4: Min assist Sitting balance - Comments: requires min A for sitting EOB Static Standing Balance Static Standing - Level of Assistance: (stedy) Dynamic Standing Balance Dynamic Standing - Comments: +2 Extremity/Trunk Assessment RUE Assessment RUE Assessment: Exceptions to [facility] General Strength Comments: 3/5 generalized weakness LUE Assessment LUE Assessment: Exceptions to [facility] Strength Comments: 3/5 generalized weakness   See Function Navigator for Current Functional Status.   Refer to Care Plan for Long Term Goals  Recommendations for other services: None    Discharge Criteria: Patient will be discharged from OT if patient refuses treatment 3 consecutive times without medical reason, if treatment goals not met, if there is a change in medical status, if patient makes no progress towards goals or if patient is  discharged from hospital.  The above assessment, treatment plan, treatment alternatives and goals were discussed and mutually agreed upon: by patient and by family  Valma Cava 08/07/2018, 2:56 PM

## 2018-08-07 NOTE — Evaluation (Signed)
Physical Therapy Assessment and Plan  Patient Details  Name: Melody Casey MRN: [id number] Date of Birth: 26-Jun-1946  PT Diagnosis: Difficulty walking and Muscle weakness Rehab Potential: Good ELOS: 17-21 days   Today's Date: 08/07/2018 PT Individual Time: 7001-7494 PT Individual Time Calculation (min): 85 min    Problem List:  Patient Active Problem List   Diagnosis Date Noted  . Debility 08/06/2018  . Acute blood loss anemia   . PAF (paroxysmal atrial fibrillation) ([place])   . Anxiety state   . Sepsis ([place]) 07/24/2018  . Acute diverticulitis of intestine 07/24/2018  . Bowel perforation ([place]) 07/24/2018  . Hyponatremia 07/24/2018  . Osteoporosis 08/27/2017  . Low vitamin D level 06/04/2017  . Frequent UTI 10/25/2015  . Microcytic anemia 08/30/2015  . Diverticulosis 08/30/2015  . Hemangioma   . TIA (transient ischemic attack) 08/13/2015  . Atrial fibrillation with rapid ventricular response ([place])   . Rheumatoid arthritis ([place]) 06/20/2015  . AAA (abdominal aortic aneurysm) without rupture ([place]) 01/28/2015  . Persistent atrial fibrillation ([place]) 11/17/2014  . History of scleroderma 02/17/2014  . Malignant neoplasm of upper-outer quadrant of right breast in female, estrogen receptor positive ([place]) 02/01/2014  . Anxiety and depression 12/07/2011  . High risk medication use 06/14/2011  . CAD (coronary artery disease)   . Type 2 diabetes mellitus with complication, without long-term current use of insulin ([place]) 06/07/2008  . HLD (hyperlipidemia) 07/22/2007  . Essential hypertension 07/22/2007    Past Medical History:  Past Medical History:  Diagnosis Date  . AAA (abdominal aortic aneurysm) ([place])   . Acute diverticulitis   . Anxiety   . Arthritis   . Atrial fibrillation ([place])    a. Dx 03/2011 - on tikosyn/coumadin.  Marland Kitchen CAD (coronary artery disease)    a. NSTEMI 02/2011: occ mid Cx, DES to OM2, residual nonobst LAD dz.  . Cancer ([place])    breast  . Diabetes mellitus   .  Diverticulitis   . Diverticulitis of colon     2008, 04/2011, 12/2014, 08/2015  . 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 Hemangioma    liver  . HLD (hyperlipidemia)   . HTN (hypertension)   . Osteoporosis   . Tobacco abuse    stopped smoking 2012  . Ureteral obstruction    History of gross hematuria/right hydronephrosis 2/2 to uteropelvic junction obstruction, s/p cystoscopy in January 2007 with bilateral retrograde pyelography, right ureter arthroscopy, right ureteral stent placement, bladder biopsies, stent removal since then.  . Wears dentures    top   Past Surgical History:  Past Surgical History:  Procedure Laterality Date  . BIOPSY N/A 11/01/2015   Procedure: BIOPSY;  Surgeon: Danie Binder, MD;  Location: [facility];  Service: Endoscopy;  Laterality: N/A;  . BREAST LUMPECTOMY WITH NEEDLE LOCALIZATION AND AXILLARY SENTINEL LYMPH NODE BX Right 03/01/2014   Procedure: BREAST LUMPECTOMY WITH NEEDLE LOCALIZATION AND AXILLARY SENTINEL LYMPH NODE BX;  Surgeon: Edward Jolly, MD;  Location: [place];  Service: General;  Laterality: Right;  . BREAST SURGERY    . CARDIAC CATHETERIZATION  2012   stent  . COLON RESECTION N/A 07/25/2018   Procedure: COLOSTOMY;  Surgeon: Rolm Bookbinder, MD;  Location: [place];  Service: General;  Laterality: N/A;  . COLONOSCOPY WITH PROPOFOL N/A 06/29/2014   Dr. Wynetta Emery: universal diverticulosis  . CORONARY STENT PLACEMENT  2012  . ESOPHAGOGASTRODUODENOSCOPY (EGD) WITH PROPOFOL N/A 11/01/2015   Procedure: ESOPHAGOGASTRODUODENOSCOPY (EGD) WITH PROPOFOL;  Surgeon: Marga Melnick  Fields, MD;  Location: [facility];  Service: Endoscopy;  Laterality: N/A;  . KNEE ARTHROSCOPY     left  . LAPAROTOMY N/A 07/25/2018   Procedure: EXPLORATORY LAPAROTOMY;  Surgeon: Rolm Bookbinder, MD;  Location: [place];  Service: General;  Laterality: N/A;  . PARTIAL COLECTOMY N/A 07/25/2018   Procedure: COLECTOMY;  Surgeon: Rolm Bookbinder, MD;  Location: [place];   Service: General;  Laterality: N/A;  . Right leg surgery  age 32   As a child for  ?scleroderma per patient  . TONSILLECTOMY AND ADENOIDECTOMY    . TUBAL LIGATION    . Ureteral surgery  2011   rt ureterostomy-stent    Assessment & Plan Clinical Impression: Patient is a 72 y.o. year old female with recent admission to the hospital on 07/24/2018 with persistent abdominal pain x5 days left lower quadrant.  CT scan showed extensive sigmoid diverticulitis with evidence of perforation containing air and stool.  She has small focus of pneumoperitoneum.  Noted INR upon admission of 3 and was reversed.  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 Donne Hazel.  Hospital course pain management.  Follow-up cardiology services for atrial fibrillation with RVR, she did require pressors.  Her chronic Coumadin has since been resumed.  Lower extremity Dopplers negative.  Acute blood loss anemia 8.1 and monitored.  Hospital course with ileus diet has slowly been advanced.  Patient transferred to CIR on 08/06/2018 .   Patient currently requires max with mobility secondary to muscle weakness and decreased sitting balance, decreased standing balance, decreased postural control and decreased balance strategies.  Prior to hospitalization, patient was independent  with mobility and lived with Spouse in a House home.  Home access is 3Stairs to enter.  Patient will benefit from skilled PT intervention to maximize safe functional mobility, minimize fall risk and decrease caregiver burden for planned discharge home with 24 hour supervision.  Anticipate patient will benefit from follow up [place] at discharge.  PT - End of Session Activity Tolerance: Tolerates 30+ min activity with multiple rests Endurance Deficit: Yes PT Assessment Rehab Potential (ACUTE/IP ONLY): Good PT Barriers to Discharge: Inaccessible home environment;Decreased caregiver support;Medical stability PT Patient demonstrates impairments in  the following area(s): Balance;Edema;Endurance;Motor;Pain PT Transfers Functional Problem(s): Bed Mobility;Bed to Chair;Car;Furniture PT Locomotion Functional Problem(s): Stairs;Wheelchair Mobility;Ambulation PT Plan PT Intensity: Minimum of 1-2 x/day ,45 to 90 minutes PT Frequency: 5 out of 7 days PT Duration Estimated Length of Stay: 17-21 days PT Treatment/Interventions: Ambulation/gait training;Discharge planning;Therapeutic Activities;Functional mobility training;Balance/vestibular training;Neuromuscular re-education;Therapeutic Exercise;Wheelchair propulsion/positioning;DME/adaptive equipment instruction;Pain management;Splinting/orthotics;UE/LE Strength taining/ROM;UE/LE Clinical cytogeneticist reintegration PT Transfers Anticipated Outcome(s): supervision PT Locomotion Anticipated Outcome(s): supervision with RW PT Recommendation Recommendations for Other Services: Therapeutic Recreation consult;Neuropsych consult Therapeutic Recreation Interventions: Stress management Follow Up Recommendations: Home health PT Patient destination: Home Equipment Recommended: To be determined  Skilled Therapeutic Intervention Pt participated in skilled PT eval and was educated on PT POC and goals. Pt expresses understanding.  Rolling in bed with mod A, total A for lower body dressing.  Supine to sit with max A, min A for sitting edge of bed. Pt performs transfers with stedy with max A from raised bed.  Squat pivot transfers with max A with manual facilitation for forward wt shift and for hips.  Sit to stand on elevated mat with +2 max A x 5 attempts.  Pt able to take small steps fwd/bkwd with RW and min A.  Pt able to perform stand pivot transfer with +2 for safety,  max A.  Attempt car transfer, pt unable to perform despite +2 assist due to fatigue.  Pt performs grooming at sink with set up assist. Pt then incontinent of bladder.  Sit to stand in stedy with  +2 assist and total A for hygiene. Pt left in bed with needs at hand, alarm set, family present.  PT Evaluation Precautions/Restrictions Precautions Precautions: Fall Precaution Comments: Colostomy Restrictions Weight Bearing Restrictions: No Pain Pain Assessment Pain Scale: 0-10 Pain Score: 0-No pain Home Living/Prior Functioning Home Living Available Help at Discharge: Family;Available 24 hours/day Type of Home: House Home Access: Stairs to enter [employer] of Steps: 3 Entrance Stairs-Rails: Right;Left;Can reach both Home Layout: One level  Lives With: Spouse Prior Function Comments: Enjoys walking, [place]  Cognition Overall Cognitive Status: Within Functional Limits for tasks assessed Arousal/Alertness: Awake/alert Orientation Level: Oriented X4 Sensation Sensation Light Touch: Appears Intact Proprioception: Appears Intact Coordination Gross Motor Movements are Fluid and Coordinated: Yes Motor  Motor Motor - Skilled Clinical Observations: generalized weakness  Mobility Bed Mobility Bed Mobility: Rolling Right;Rolling Left Rolling Right: Moderate Assistance - Patient 50-74% Rolling Left: Moderate Assistance - Patient 50-74% Transfers Transfers: Sit to Music therapist via Geophysicist/field seismologist Sit to Stand: 2 Helpers Lateral/Scoot Transfers: Maximal Assistance - Patient 25-49% Transfer (Assistive device): Comptroller via Lift Equipment: Probation officer Ambulation: Yes Gait Assistance: Moderate Assistance - Patient 50-74% Gait Distance (Feet): 5 Feet Assistive device: Rolling walker Gait Assistance Details: Verbal cues for safe use of DME/AE;Verbal cues for technique;Tactile cues for placement;Tactile cues for weight shifting;Manual facilitation for weight shifting Stairs / Additional Locomotion Stairs: No Wheelchair Mobility Wheelchair Mobility: No  Trunk/Postural Assessment  Cervical  Assessment Cervical Assessment: (fwd head) Thoracic Assessment Thoracic Assessment: (kyphosis) Lumbar Assessment Lumbar Assessment: (posterior pelvic tilt) Postural Control Postural Control: Deficits on evaluation Righting Reactions: delayed  Balance Balance Balance Assessed: Yes Dynamic Sitting Balance Sitting balance - Comments: requires min A for sitting EOB Dynamic Standing Balance Dynamic Standing - Comments: +2 assist Extremity Assessment      RLE Assessment General Strength Comments: foot deformity, grossly 3/5 LLE Assessment General Strength Comments: grossly 3-/5, limited by pain in Lt knee and swelling   See Function Navigator for Current Functional Status.   Refer to Care Plan for Long Term Goals  Recommendations for other services: Neuropsych and Therapeutic Recreation  Stress management  Discharge Criteria: Patient will be discharged from PT if patient refuses treatment 3 consecutive times without medical reason, if treatment goals not met, if there is a change in medical status, if patient makes no progress towards goals or if patient is discharged from hospital.  The above assessment, treatment plan, treatment alternatives and goals were discussed and mutually agreed upon: by patient  DONAWERTH,KAREN 08/07/2018, 11:11 AM

## 2018-08-07 NOTE — Progress Notes (Signed)
Physical Therapy Session Note  Patient Details  Name: Melody Casey MRN: [id number] Date of Birth: 1946/10/24  Today's Date: 08/07/2018 PT Individual Time: 1300-1326 PT Individual Time Calculation (min): 26 min   Short Term Goals: Week 1:  PT Short Term Goal 1 (Week 1): pt will perform functional transfers with max A PT Short Term Goal 2 (Week 1): pt will perform gait 10' in controlled environment with mod A  Skilled Therapeutic Interventions/Progress Updates:  Pt c/o fatigue but willing to sit edge of bed to eat ice cream (pt states she does not want her lunch).  Pt requires max A for supine to sit.  Sitting with min guard assist x 15 minutes with bilat LE support while eating. Pt requires mod A to scoot edge of bed with mod manual facilitation for wt shifts and lifting assist at hips.  Pt max A for sit to supine. Pt left in bed with alarm set, needs at hand. Pt missed 30 minutes skilled PT due to fatigue.  Therapy Documentation Precautions:  Precautions Precautions: Fall Precaution Comments: Colostomy  Restrictions Weight Bearing Restrictions: No General: PT Amount of Missed Time (min): 30 Minutes PT Missed Treatment Reason: Patient fatigue Pain: Pt c/o abdominal pain with mobility, eases with rest     Therapy/Group: Individual Therapy  Dayshawn Irizarry 08/07/2018, 1:27 PM

## 2018-08-07 NOTE — Plan of Care (Signed)
  Problem: RH BOWEL ELIMINATION Goal: RH STG MANAGE BOWEL WITH ASSISTANCE Description STG Manage Bowel with Assistance. Mod assist to manage ostomy  Outcome: Progressing   Problem: RH SKIN INTEGRITY Goal: RH STG MAINTAIN SKIN INTEGRITY WITH ASSISTANCE Description STG Maintain Skin Integrity With Assistance. Mod assist  Outcome: Progressing Goal: RH STG ABLE TO PERFORM INCISION/WOUND CARE W/ASSISTANCE Description STG Able To Perform Incision/Wound Care With Assistance. Mod assist  Outcome: Progressing   Problem: RH PAIN MANAGEMENT Goal: RH STG PAIN MANAGED AT OR BELOW PT'S PAIN GOAL Description Less than 4  Outcome: Progressing   Problem: Education: Goal: Knowledge of ostomy care will improve Outcome: Progressing Goal: Understanding of discharge needs will improve Outcome: Progressing   Problem: Bowel/Gastric/Urinary: Goal: Gastrointestinal status for postoperative course will improve Outcome: Progressing   Problem: RH BLADDER ELIMINATION Goal: RH STG MANAGE BLADDER WITH ASSISTANCE Description STG Manage Bladder With Assistance. Min assist  Outcome: Not Progressing  Pt has periods of incontinence d/t urgency. Pt educated to use toilet for voids q2h or to call if feel urgency to prevent incontinence.   Erie Noe, LPN

## 2018-08-07 NOTE — Progress Notes (Signed)
Electrolytes and mag while on Tikosyn:  Mg was replaced yesterday. K+ 3.5 this AM. Pt is on Tikosyn. Recommendation is for K+ to be >4 and Mg >2. Ok to give kcl 40meq x1 per Dr. Naaman Plummer. Will get both level in AM.   Onnie Boer, PharmD, BCIDP, AAHIVP, CPP Infectious Disease Pharmacist Pager: [phone] 08/07/2018 5:06 PM

## 2018-08-08 ENCOUNTER — Inpatient Hospital Stay (HOSPITAL_COMMUNITY): Payer: Medicare Other | Admitting: Occupational Therapy

## 2018-08-08 ENCOUNTER — Inpatient Hospital Stay (HOSPITAL_COMMUNITY): Payer: Medicare Other

## 2018-08-08 ENCOUNTER — Inpatient Hospital Stay (HOSPITAL_COMMUNITY): Payer: Medicare Other | Admitting: Physical Therapy

## 2018-08-08 LAB — BASIC METABOLIC PANEL
Anion gap: 6 (ref 5–15)
BUN: 6 mg/dL — AB (ref 8–23)
CO2: 21 mmol/L — ABNORMAL LOW (ref 22–32)
Calcium: 8.5 mg/dL — ABNORMAL LOW (ref 8.9–10.3)
Chloride: 104 mmol/L (ref 98–111)
Creatinine, Ser: 0.36 mg/dL — ABNORMAL LOW (ref 0.44–1.00)
GFR calc Af Amer: >60 mL/min (ref >60)
Glucose, Bld: 83 mg/dL (ref 70–99)
POTASSIUM: 3.6 mmol/L (ref 3.5–5.1)
SODIUM: 131 mmol/L — AB (ref 135–145)

## 2018-08-08 LAB — CBC
HEMATOCRIT: 25.1 % — AB (ref 36.0–46.0)
Hemoglobin: 8 g/dL — ABNORMAL LOW (ref 12.0–15.0)
MCH: 27.6 pg (ref 26.0–34.0)
MCHC: 31.9 g/dL (ref 30.0–36.0)
MCV: 86.6 fL (ref 78.0–100.0)
Platelets: 308 10*3/uL (ref 150–400)
RBC: 2.9 MIL/uL — ABNORMAL LOW (ref 3.87–5.11)
RDW: 18.6 % — AB (ref 11.5–15.5)
WBC: 5 10*3/uL (ref 4.0–10.5)

## 2018-08-08 LAB — PROTIME-INR
INR: 1.64
PROTHROMBIN TIME: 19.3 s — AB (ref 11.4–15.2)

## 2018-08-08 LAB — HEPARIN LEVEL (UNFRACTIONATED): HEPARIN UNFRACTIONATED: 0.39 [IU]/mL (ref 0.30–0.70)

## 2018-08-08 LAB — GLUCOSE, CAPILLARY
Glucose-Capillary: 75 mg/dL (ref 70–99)
Glucose-Capillary: 107 mg/dL — ABNORMAL HIGH (ref 70–99)
Glucose-Capillary: 125 mg/dL — ABNORMAL HIGH (ref 70–99)

## 2018-08-08 LAB — MAGNESIUM: Magnesium: 1.4 mg/dL — ABNORMAL LOW (ref 1.7–2.4)

## 2018-08-08 MED ORDER — WARFARIN SODIUM 5 MG PO TABS
5.0000 mg | ORAL_TABLET | Freq: Once | ORAL | Status: AC
  Administered 2018-08-08: 5 mg via ORAL
  Filled 2018-08-08: qty 1

## 2018-08-08 MED ORDER — MAGNESIUM SULFATE 4 GM/100ML IV SOLN
4.0000 g | Freq: Once | INTRAVENOUS | Status: AC
  Administered 2018-08-08: 4 g via INTRAVENOUS
  Filled 2018-08-08: qty 100

## 2018-08-08 MED ORDER — POTASSIUM CHLORIDE CRYS ER 20 MEQ PO TBCR
40.0000 meq | EXTENDED_RELEASE_TABLET | Freq: Once | ORAL | Status: AC
  Administered 2018-08-08: 40 meq via ORAL
  Filled 2018-08-08: qty 2

## 2018-08-08 NOTE — Progress Notes (Signed)
Changed dressing noted some drainage and tenderness.

## 2018-08-08 NOTE — Plan of Care (Signed)
  Problem: RH BOWEL ELIMINATION Goal: RH STG MANAGE BOWEL WITH ASSISTANCE Description STG Manage Bowel with Assistance. Mod assist to manage ostomy  Outcome: Progressing   Problem: RH BLADDER ELIMINATION Goal: RH STG MANAGE BLADDER WITH ASSISTANCE Description STG Manage Bladder With Assistance. Min assist  Outcome: Progressing   Problem: RH SKIN INTEGRITY Goal: RH STG MAINTAIN SKIN INTEGRITY WITH ASSISTANCE Description STG Maintain Skin Integrity With Assistance. Mod assist  Outcome: Progressing Goal: RH STG ABLE TO PERFORM INCISION/WOUND CARE W/ASSISTANCE Description STG Able To Perform Incision/Wound Care With Assistance. Mod assist  Outcome: Progressing   Problem: RH PAIN MANAGEMENT Goal: RH STG PAIN MANAGED AT OR BELOW PT'S PAIN GOAL Description Less than 4  Outcome: Progressing   Problem: Education: Goal: Knowledge of ostomy care will improve Outcome: Progressing Goal: Understanding of discharge needs will improve Outcome: Progressing   Problem: Bowel/Gastric/Urinary: Goal: Gastrointestinal status for postoperative course will improve Outcome: Progressing   Problem: Consults Goal: RH GENERAL PATIENT EDUCATION Description See Patient Education module for education specifics. Outcome: Progressing  Erie Noe, LPN

## 2018-08-08 NOTE — Progress Notes (Signed)
[place] for Heparin >> warfarin Indication: atrial fibrillation  Allergies  Allergen Reactions  . Miralax [Polyethylene Glycol] Swelling and Rash    Took [facility] brand developed rash. Patient states she tolerated name brand MiraLax in the past.  09/01/15. Patient states she had diffuse swelling including swelling of her lips.   . Vancomycin Rash and Shortness Of Breath  . Acetaminophen Hives  . Oxycodone-Acetaminophen Itching  . Sulfa Antibiotics Rash    All over rash  . Sulfacetamide Sodium Rash    All over rash  . Sulfasalazine Rash    All over rash All over rash  . Banana Other (See Comments)    Unknown  . Latex Rash  . Tape Rash   Patient Measurements: Height: 5\' 8"  (172.7 cm) Weight: 197 lb 15.6 oz (89.8 kg) IBW/kg (Calculated) : 63.9 Heparin Dosing Weight: 76 kg  Vital Signs: Temp: 98.1 F (36.7 C) (08/30 0507) BP: 139/64 (08/30 0507) Pulse Rate: 92 (08/30 0507)  Labs: Recent Labs    08/06/18 0330 08/07/18 0700 08/08/18 0511  HGB 8.1* 8.4* 8.0*  HCT 24.9* 26.3* 25.1*  PLT 329 349 308  LABPROT 17.1* 17.7* 19.3*  INR 1.41 1.47 1.64  HEPARINUNFRC 0.29* 0.47 0.39  CREATININE 0.46 0.37* 0.36*   Estimated Creatinine Clearance: 72.4 mL/min (A) (by C-G formula based on SCr of 0.36 mg/dL (L)).  Assessment: 37 yoF on Coumadin 5mg  for Afib PTA. Heparin bridge given pt in Afib RVR and CHADsVAsc 7. INR trending up to 1.64. Last heparin level therapeutic at 0.39. Hgb 8, plts wnl.  Goal of Therapy:  INR 2-3 Heparin level 0.3-0.5 Monitor platelets by anticoagulation protocol: Yes   Plan:  Continue heparin gtt at 1,550 units/hr Continue Coumadin 5mg  PO x 1 Monitor daily heparin level / INR, CBC, s/s of bleed  Elenor Quinones, PharmD, BCPS Clinical Pharmacist Phone number [phone] 08/08/2018 8:08 AM

## 2018-08-08 NOTE — Consult Note (Signed)
[place] Nurse wound follow up Wound type: surgical  Measurement: 19cm x 6.5cm x 8.5cm with 3 small openings medial and proximal wound bed, depth is deepest at the distal wound bed Wound bed:90% early granulation tissue, 10% yellow slough in the base distally  Drainage (amount, consistency, odor) minimal, serosanguinous, no odor  Periwound:intact, stoma in very close proximity to the wound bed  Dressing procedure/placement/frequency: 3 strips of white foam cut and one each placed in the openings proximal in the wound bed 1pc of white foam placed in the wound bed distally over the sloughy area and the deepest of the wound bed  4 pieces of white foam used total  1 pc of black foam used to fill the remainder of the wound bed, sealed with drape. Seal obtained at 18mmHG.  Patient tolerated quite well.   [place] Nurse ostomy follow up Stoma type/location: LLQ, end colostomy Stomal assessment/size: edematous, pink, moist, budded stoma. 2 1/4" round Peristomal assessment: intact Treatment options for stomal/peristomal skin: Using barrier ring cut in half and then doubled along the medial aspect of the stoma between the stoma and the wound edge to lessen likelihood of leakage.  Output pasty, brown Ostomy pouching: 1pc.with barrier ring as described above.  Education provided:  Explained role of ostomy nurse and creation of stoma  Explained stoma characteristics (budded, flush, color, texture, care) Demonstrated pouch change (cutting new skin barrier, measuring stoma, cleaning peristomal skin and stoma, use of barrier ring) Explained use of wick to clean spout  Discussed bathing, diet, gas Allowed husband to cut new pouch for practice, he completed with minimal difficulty SS box brought to the room by the patient's daughter so we went through those items so they understand the samples and use.   Enrolled patient in [place] Start Discharge program: Yes  [place] nurse team will follow along M/W/F to  change NPWT dressing and due to proximity of the pouch/stoma to the midline we will change ostomy pouch at the same time.   Extra ostomy supplies (flat one piece and barrier rings) in the room in the patients night stand. Extra NPWT dressing supplies ordered to the room (white foam and black foam).   [place], [place], [place]

## 2018-08-08 NOTE — Progress Notes (Signed)
[facility] PHYSICAL MEDICINE & REHABILITATION     PROGRESS NOTE    Subjective/Complaints: Made it through yesterday. Fatigued in the afternoon. Able to sleep last night. Mood is ok  ROS: Patient denies fever, rash, sore throat, blurred vision, nausea, vomiting, diarrhea, cough, shortness of breath or chest pain, joint or back pain, headache, or mood change.     Objective:  No results found. Recent Labs    08/07/18 0700 08/08/18 0511  WBC 5.0 5.0  HGB 8.4* 8.0*  HCT 26.3* 25.1*  PLT 349 308   Recent Labs    08/07/18 0700 08/08/18 0511  NA 132* 131*  K 3.5 3.6  CL 105 104  GLUCOSE 98 83  BUN 6* 6*  CREATININE 0.37* 0.36*  CALCIUM 8.3* 8.5*   CBG (last 3)  Recent Labs    08/07/18 1645 08/07/18 2134 08/08/18 0642  GLUCAP 89 81 75    Wt Readings from Last 3 Encounters:  08/06/18 89.8 kg  08/04/18 75.3 kg  07/22/18 75.8 kg     Intake/Output Summary (Last 24 hours) at 08/08/2018 0845 Last data filed at 08/08/2018 0600 Gross per 24 hour  Intake 1520.4 ml  Output 75 ml  Net 1445.4 ml    Vital Signs: Blood pressure 139/64, pulse 92, temperature 98.1 F (36.7 C), resp. rate 18, height 5\' 8"  (1.727 m), weight 89.8 kg, SpO2 96 %. Physical Exam:  Constitutional: No distress . Vital signs reviewed. HEENT: EOMI, oral membranes moist Neck: supple Cardiovascular: RRR without murmur. No JVD    Respiratory: CTA Bilaterally without wheezes or rales. Normal effort    GI: BS +, tender, ostomy, wound pink/beefy red, minimal debris Musculoskeletal:  Intrinsic minus left foot Right foot atrophy Some Left foot edema  Neurological: She is alert and oriented to person, place, and time.  Motor: Bilateral upper: 4/5 proximal distal Right lower extremity: Hip flexion, knee extension 3+/5, ankle contracture Left lower cervical and hip flexion, knee extension 2/5, ankle 2+/5 ---no changes today Skin: Skin is warm and dry.  Psychiatric: cooperative. A little flat    Assessment/Plan: 1. Functional deficits secondary to debility after colectomy which require 3+ hours per day of interdisciplinary therapy in a comprehensive inpatient rehab setting. Physiatrist is providing close team supervision and 24 hour management of active medical problems listed below. Physiatrist and rehab team continue to assess barriers to discharge/monitor patient progress toward functional and medical goals.  Function:  Bathing Bathing position Bathing activity did not occur: Refused    Bathing parts      Bathing assist        Upper Body Dressing/Undressing Upper body dressing Upper body dressing/undressing activity did not occur: Refused                  Upper body assist        Lower Body Dressing/Undressing Lower body dressing Lower body dressing/undressing activity did not occur: Refused What is the patient wearing?: Pants       Pants- Performed by helper: Thread/unthread right pants leg, Thread/unthread left pants leg, Pull pants up/down, Fasten/unfasten pants                      Lower body assist        Toileting Toileting   Toileting steps completed by patient: Adjust clothing prior to toileting, Adjust clothing after toileting Toileting steps completed by helper: Performs perineal hygiene Toileting Assistive Devices: Grab bar or rail  Toileting assist Assist level: Touching  or steadying assistance (Pt.75%)   Transfers Chair/bed transfer     Chair/bed transfer assist level: 2 helpers       Locomotion Ambulation     Max distance: 5 Assist level: Moderate assist (Pt 50 - 74%)   Wheelchair          Cognition Comprehension Comprehension assist level: Follows complex conversation/direction with extra time/assistive device  Expression Expression assist level: Expresses complex ideas: With extra time/assistive device  Social Interaction Social Interaction assist level: Interacts appropriately with others with medication or  extra time (anti-anxiety, antidepressant).  Problem Solving Problem solving assist level: Solves complex problems: With extra time  Memory Memory assist level: Recognizes or recalls 90% of the time/requires cueing < 10% of the time   Medical Problem List and Plan: 1.  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  DVT Prophylaxis/Anticoagulation: Heparin bridge to chronic Coumadin resumed for history of atrial fibrillation 3. Pain Management: Ultram as needed 4. Mood: Xanax 0.25 mg 3 times daily as needed  -would like neuro-psych to see. Pt is open to that  -ask her to let me know if she is experiencing further decrease in her mood 5. Neuropsych: This patient is capable of making decisions on her own behalf.  -team to provide ego support as needed 6. Skin/Wound Care:    -  Midline wound dressings 3 times daily.    -surgery to see today re: vac, location near ostomy might present a challenge 7. Fluids/Electrolytes/Nutrition: encourae PO  -sodium trending up 132 8.  Acute blood loss anemia.  Follow-up CBC hgb up to 8.4 9.  Atrial fibrillation.  Cardizem 360 mg daily, Tikosyn 250 mg twice daily.  Cardiac rate controlled  -keep potassium 4.0 or greater 10.  Hypertension.  Norvasc 2.5 mg daily 11.  Rheumatoid arthritis.  Plaquenil 200 mg twice daily 12.  Hyperlipidemia.  Lipitor   LOS (Days) 2 A FACE TO FACE EVALUATION WAS PERFORMED  [name], MD 08/08/2018 8:45 AM

## 2018-08-08 NOTE — Progress Notes (Signed)
Occupational Therapy Session Note  Patient Details  Name: Melody Casey MRN: [id number] Date of Birth: 06-04-1946  Today's Date: 08/08/2018 OT Individual Time: 0900-1000 OT Individual Time Calculation (min): 60 min   Short Term Goals: Week 1:  OT Short Term Goal 1 (Week 1): Pt will complete toilet transfer with mod A of 1  OT Short Term Goal 2 (Week 1): Pt will perform 1 step of ostomy care with min A OT Short Term Goal 3 (Week 1): Pt will complete 1 step of LB dressing task.  OT Short Term Goal 4 (Week 1): Pt will tolerate 1 minute standing in stedy in preparation for BADL task  Skilled Therapeutic Interventions/Progress Updates:    Pt greeted semi-reclined in bed and agreeable to OT treatment session focused on modified bathing/dressing tasks and activity tolerance. Pt came to sitting EOB with mod A and increased time 2/2 abdominal pain. Stedy used for max A sit<>stand then brought to the sink in [place]. UB bathing completed seated in [place] for ~5 mins, then pt reported need to rest. Finished UB bathing/dressing seated in wc with set-up. Pt needed extended rest breaks in between all BADL tasks. Pt incontinent of bladder in brief, then when standing in stedy to change brief, pt with more bladder incontinence. Total A for peri-care and brief change in standing. Pt also unable to fulle extend trunk in standing despite verbal cues and facilitation 2/2 abdominal pain. Grooming tasks completed seated in wc at the sink. Pt then transferred back to bed 2/2 scheduled education from [facility] for ostomy and wound vac. Max A+2 and multiple trials to stand from lower wc to transfer back to bed. Pt left semi-reclined with nursing present and needs met.   Therapy Documentation Precautions:  Precautions Precautions: Fall Precaution Comments: Colostomy  Restrictions Weight Bearing Restrictions: No Pain: Pain Assessment Pain Scale: 0-10 Pain Score: 5  Pain Type: Acute pain Pain Location: Abdomen Pain  Descriptors / Indicators: Aching Pain Frequency: Intermittent Pain Onset: On-going Patients Stated Pain Goal: 2 Pain Intervention(s): Repositioned ADL: ADL ADL Comments: Please see functional navigator    See Function Navigator for Current Functional Status.   Therapy/Group: Individual Therapy  Valma Cava 08/08/2018, 10:01 AM

## 2018-08-08 NOTE — Progress Notes (Signed)
Occupational Therapy Note  Patient Details  Name: Melody Casey MRN: [id number] Date of Birth: [birth year]  Today's Date: 08/08/2018 OT Missed Time: 46 Minutes Missed Time Reason: Patient fatigue  Pt reports fatigue and declines out of bed activity due to pain. <Missed 30 min of OT.   Willeen Cass [facility] 08/08/2018, 3:04 PM

## 2018-08-08 NOTE — Progress Notes (Signed)
Physical Therapy Session Note  Patient Details  Name: REJEANA FADNESS MRN: [id number] Date of Birth: 04-17-1946  Today's Date: 08/08/2018 PT Individual Time: 1300-1403 PT Individual Time Calculation (min): 63 min   Short Term Goals: Week 1:  PT Short Term Goal 1 (Week 1): pt will perform functional transfers with max A PT Short Term Goal 2 (Week 1): pt will perform gait 10' in controlled environment with mod A  Skilled Therapeutic Interventions/Progress Updates:    pt in bed, agreeable to therapy with encouragement.  Pt performs supine to sit with max A, min A for sitting balance edge of bed.  stedy with mod A out of bed.  Sit <> stand repetitions at various heights on hi lo mat with pt improving and able to stand with min/mod A with RW.  Squat pivot transfers throughout session with mod A with manual facilitation for wt shift and for lifting hips.  nustep x 5 minutes for UE/LE strength and endurance with frequent rest breaks. Pt provided with ted hose for Lt LE.  Pt left in w/c eating lunch with daughter present.  Therapy Documentation Precautions:  Precautions Precautions: Fall Precaution Comments: Colostomy  Restrictions Weight Bearing Restrictions: No Pain:  pt c/o abdominal pain throughout session, meds given prior to session   Therapy/Group: Individual Therapy  Dewanna Hurston 08/08/2018, 2:03 PM

## 2018-08-08 NOTE — Progress Notes (Signed)
Occupational Therapy Session Note  Patient Details  Name: Melody Casey MRN: [id number] Date of Birth: 03/09/1946  Today's Date: 08/08/2018 OT Individual Time: 0177-9390 OT Individual Time Calculation (min): 50 min    Short Term Goals: Week 1:  OT Short Term Goal 1 (Week 1): Pt will complete toilet transfer with mod A of 1  OT Short Term Goal 2 (Week 1): Pt will perform 1 step of ostomy care with min A OT Short Term Goal 3 (Week 1): Pt will complete 1 step of LB dressing task.  OT Short Term Goal 4 (Week 1): Pt will tolerate 1 minute standing in stedy in preparation for BADL task  Skilled Therapeutic Interventions/Progress Updates:    1:1. Pt with pain in R abdomen. RN aware. Pt agreeable to therapy to tolerance. In tx gym pt completes squat pivot transfers with MOD- MAX A in B directions w/c<>EOB/EOM with VC for head hips relationships. Pt completes 4 rounds of beanbag toss reaching laterally/anteriorly/crossing midline in mod ranges outside BOS with supervision for sitting balance. Pt completes ball toss 3x30 (chest, bounce and overhead pass) with VC for using BUE equally and not taking rest break to improve working endurance. OT issues elevating led rest to help decrease swelling in LLE. In bed, OT braces feet and pt able to bridge hips/pull with arms to scoot up in bed. Exited session with pt seated in bed, call light in reach and daughter present to supervise.   Therapy Documentation Precautions:  Precautions Precautions: Fall Precaution Comments: Colostomy  Restrictions Weight Bearing Restrictions: No General: General OT Amount of Missed Time: 30 Minutes Vital Signs: Therapy Vitals Temp: 98.1 F (36.7 C) Temp Source: Oral Pulse Rate: 99 Resp: 18 BP: 118/65 Patient Position (if appropriate): Sitting Oxygen Therapy SpO2: 99 % O2 Device: Room Air  See Function Navigator for Current Functional Status.   Therapy/Group: Individual Therapy  Tonny Branch 08/08/2018, 3:58 PM

## 2018-08-08 NOTE — Progress Notes (Signed)
[facility] Progress Note     Subjective: CC: Wound check Midline wound still with some slough. Patient still tender with dressing changes. Tolerating diet and having bowel output. Daughter and husband at bedside.   Objective: Vital signs in last 24 hours: Temp:  [97.6 F (36.4 C)-98.1 F (36.7 C)] 98.1 F (36.7 C) (08/30 0507) Pulse Rate:  [88-93] 92 (08/30 0507) Resp:  [18-19] 18 (08/30 0507) BP: (103-139)/(62-68) 139/64 (08/30 0507) SpO2:  [95 %-96 %] 96 % (08/30 0507) Last BM Date: 08/08/18  Intake/Output from previous day: 08/29 0701 - 08/30 0700 In: 1760.4 [P.O.:480; I.V.:1280.4] Out: 75 [Stool:75] Intake/Output this shift: Total I/O In: 240 [P.O.:240] Out: 75 [Stool:75]  PE: Gen:  Alert, NAD, pleasant Pulm:  Normal effort Abd: Soft, appropriately tender, non-distended, midline wound as below, stoma pink with stool in ostomy bag  Skin: warm and dry, no rashes  Psych: A&Ox3   Lab Results:  Recent Labs    08/07/18 0700 08/08/18 0511  WBC 5.0 5.0  HGB 8.4* 8.0*  HCT 26.3* 25.1*  PLT 349 308   BMET Recent Labs    08/07/18 0700 08/08/18 0511  NA 132* 131*  K 3.5 3.6  CL 105 104  CO2 22 21*  GLUCOSE 98 83  BUN 6* 6*  CREATININE 0.37* 0.36*  CALCIUM 8.3* 8.5*   PT/INR Recent Labs    08/07/18 0700 08/08/18 0511  LABPROT 17.7* 19.3*  INR 1.47 1.64   CMP     Component Value Date/Time   NA 131 (L) 08/08/2018 0511   NA 132 (L) 07/22/2018 1632   NA 134 (L) 06/06/2017 1259   K 3.6 08/08/2018 0511   K 4.5 06/06/2017 1259   CL 104 08/08/2018 0511   CO2 21 (L) 08/08/2018 0511   CO2 24 06/06/2017 1259   GLUCOSE 83 08/08/2018 0511   GLUCOSE 311 (H) 06/06/2017 1259   BUN 6 (L) 08/08/2018 0511   BUN 32 (H) 07/22/2018 1632   BUN 18.3 06/06/2017 1259   CREATININE 0.36 (L) 08/08/2018 0511   CREATININE 1.0 06/06/2017 1259   CALCIUM 8.5 (L) 08/08/2018 0511   CALCIUM 10.6 (H) 06/06/2017 1259   PROT 4.9 (L) 08/07/2018 0700   PROT 6.5  07/22/2018 1632   PROT 7.7 06/06/2017 1259   ALBUMIN 1.5 (L) 08/07/2018 0700   ALBUMIN 3.5 07/22/2018 1632   ALBUMIN 3.3 (L) 06/06/2017 1259   AST 20 08/07/2018 0700   AST 17 06/06/2017 1259   ALT 12 08/07/2018 0700   ALT 14 06/06/2017 1259   ALKPHOS 74 08/07/2018 0700   ALKPHOS 115 06/06/2017 1259   BILITOT 0.6 08/07/2018 0700   BILITOT 0.9 07/22/2018 1632   BILITOT 0.52 06/06/2017 1259   GFRNONAA >60 08/08/2018 0511   GFRNONAA 80 06/22/2015 1614   GFRAA >60 08/08/2018 0511   GFRAA >89 06/22/2015 1614   Lipase     Component Value Date/Time   LIPASE 24 07/24/2018 0944       Studies/Results: No results found.  Anti-infectives: Anti-infectives (From admission, onward)   Start     Dose/Rate Route Frequency Ordered Stop   08/06/18 2000  hydroxychloroquine (PLAQUENIL) tablet 200 mg     200 mg Oral 2 times daily 08/06/18 1651         Assessment/Plan Principal Problem: Sepsis ([place]) Active Problems: Type 2 diabetes mellitus with complication, without long-term current use of insulin (HCC) CAD (coronary artery disease) Anxiety and depression AAA (abdominal aortic aneurysm) without rupture (HCC) Microcytic anemia  Diverticulosis Acute diverticulitis of intestine Bowel perforation (HCC) Hyponatremia  Bowel perforation POD#14S/P Hartman's (left colectomy, end transverse colostomy), Dr. Donne Hazel, 08/16 -bowel function stable -soft diet, continueensure - VAC M/W/F with white foam in base - we will recheck wound on Monday with VAC changes - PT/OT, per CIR  JZB:FMZU, ensure VTE: SCD's, lovenox AU:EBVPL 08/15-08/21 Follow up:Dr. Donne Hazel   LOS: 2 days    Brigid Re , [facility] 08/08/2018, 10:36 AM Pager: [phone] Consults: [phone] Mon-Fri 7:00 am-4:30 pm Sat-Sun 7:00 am-11:30 am

## 2018-08-08 NOTE — Plan of Care (Signed)
  Problem: RH SKIN INTEGRITY Goal: RH STG ABLE TO PERFORM INCISION/WOUND CARE W/ASSISTANCE Description STG Able To Perform Incision/Wound Care With Assistance. Mod assist  Outcome: Progressing  Administered dressing change as ordered

## 2018-08-09 ENCOUNTER — Inpatient Hospital Stay (HOSPITAL_COMMUNITY): Payer: Medicare Other | Admitting: Occupational Therapy

## 2018-08-09 ENCOUNTER — Inpatient Hospital Stay (HOSPITAL_COMMUNITY): Payer: Medicare Other | Admitting: Physical Therapy

## 2018-08-09 ENCOUNTER — Inpatient Hospital Stay (HOSPITAL_COMMUNITY): Payer: Medicare Other

## 2018-08-09 LAB — BASIC METABOLIC PANEL
Anion gap: 5 (ref 5–15)
BUN: 6 mg/dL — AB (ref 8–23)
CHLORIDE: 106 mmol/L (ref 98–111)
CO2: 21 mmol/L — AB (ref 22–32)
Calcium: 8.5 mg/dL — ABNORMAL LOW (ref 8.9–10.3)
Creatinine, Ser: 0.39 mg/dL — ABNORMAL LOW (ref 0.44–1.00)
GLUCOSE: 92 mg/dL (ref 70–99)
Potassium: 3.8 mmol/L (ref 3.5–5.1)
Sodium: 132 mmol/L — ABNORMAL LOW (ref 135–145)

## 2018-08-09 LAB — PROTIME-INR
INR: 1.74
PROTHROMBIN TIME: 20.2 s — AB (ref 11.4–15.2)

## 2018-08-09 LAB — CBC
HEMATOCRIT: 27.6 % — AB (ref 36.0–46.0)
Hemoglobin: 8.6 g/dL — ABNORMAL LOW (ref 12.0–15.0)
MCH: 27.3 pg (ref 26.0–34.0)
MCHC: 31.2 g/dL (ref 30.0–36.0)
MCV: 87.6 fL (ref 78.0–100.0)
Platelets: 337 10*3/uL (ref 150–400)
RBC: 3.15 MIL/uL — ABNORMAL LOW (ref 3.87–5.11)
RDW: 18.5 % — AB (ref 11.5–15.5)
WBC: 4.8 10*3/uL (ref 4.0–10.5)

## 2018-08-09 LAB — GLUCOSE, CAPILLARY
GLUCOSE-CAPILLARY: 76 mg/dL (ref 70–99)
GLUCOSE-CAPILLARY: 86 mg/dL (ref 70–99)
Glucose-Capillary: 93 mg/dL (ref 70–99)
Glucose-Capillary: 99 mg/dL (ref 70–99)

## 2018-08-09 LAB — MAGNESIUM: MAGNESIUM: 1.9 mg/dL (ref 1.7–2.4)

## 2018-08-09 LAB — HEPARIN LEVEL (UNFRACTIONATED): HEPARIN UNFRACTIONATED: 0.23 [IU]/mL — AB (ref 0.30–0.70)

## 2018-08-09 MED ORDER — POTASSIUM CHLORIDE CRYS ER 20 MEQ PO TBCR
40.0000 meq | EXTENDED_RELEASE_TABLET | Freq: Once | ORAL | Status: AC
  Administered 2018-08-09: 40 meq via ORAL
  Filled 2018-08-09: qty 2

## 2018-08-09 MED ORDER — POTASSIUM CHLORIDE CRYS ER 20 MEQ PO TBCR
20.0000 meq | EXTENDED_RELEASE_TABLET | Freq: Two times a day (BID) | ORAL | Status: DC
  Administered 2018-08-10: 20 meq via ORAL
  Filled 2018-08-09: qty 1

## 2018-08-09 MED ORDER — WARFARIN SODIUM 5 MG PO TABS
5.0000 mg | ORAL_TABLET | Freq: Once | ORAL | Status: AC
  Administered 2018-08-09: 5 mg via ORAL
  Filled 2018-08-09: qty 1

## 2018-08-09 NOTE — IPOC Note (Signed)
Overall Plan of Care [facility]) Patient Details Name: Melody Casey MRN: [id number] DOB: 05/17/1946  Admitting Diagnosis: <principal problem not specified>  Hospital Problems: Active Problems:   Debility     Functional Problem List: Nursing Bladder, Bowel, Safety, Skin Integrity, Pain, Endurance, Edema  PT Balance, Edema, Endurance, Motor, Pain  OT Balance, Endurance, Motor, Pain  SLP    TR         Basic ADL's: OT Eating, Grooming, Bathing, Dressing, Toileting     Advanced  ADL's: OT       Transfers: PT Bed Mobility, Bed to Chair, Car, Patent attorney, Agricultural engineer: PT Stairs, Emergency planning/management officer, Ambulation     Additional Impairments: OT None  SLP        TR      Anticipated Outcomes Item Anticipated Outcome  Self Feeding mod I  Swallowing      Basic self-care  Media planner Transfers Supervision  Bowel/Bladder  manage ostomy with min assist, remain continent of urine  Transfers  supervision  Locomotion  supervision with RW  Communication     Cognition     Pain  less than 4 for dressing changes, remain free of pain otherwise  Safety/Judgment  Free of falls, infection and further skin breakdown   Therapy Plan: PT Intensity: Minimum of 1-2 x/day ,45 to 90 minutes PT Frequency: 5 out of 7 days PT Duration Estimated Length of Stay: 17-21 days OT Intensity: Minimum of 1-2 x/day, 45 to 90 minutes OT Frequency: 5 out of 7 days OT Duration/Estimated Length of Stay: 2.5-3 weeks      Team Interventions: Nursing Interventions Patient/Family Education, Bladder Management, Bowel Management, Pain Management, Medication Management, Skin Care/Wound Management, Discharge Planning  PT interventions Ambulation/gait training, Discharge planning, Therapeutic Activities, Functional mobility training, Balance/vestibular training, Neuromuscular re-education, Therapeutic Exercise, Wheelchair propulsion/positioning,  DME/adaptive equipment instruction, Pain management, Splinting/orthotics, UE/LE Strength taining/ROM, UE/LE Coordination activities, Stair training, Patient/family education, Community reintegration  OT Interventions Training and development officer, Academic librarian, Discharge planning, Disease mangement/prevention, Engineer, drilling, Functional mobility training, Pain management, Patient/family education, Self Care/advanced ADL retraining, Skin care/wound managment, Therapeutic Activities, UE/LE Strength taining/ROM, Therapeutic Exercise, UE/LE Coordination activities, Wheelchair propulsion/positioning  SLP Interventions    TR Interventions    SW/CM Interventions Discharge Planning, Psychosocial Support, Patient/Family Education   Barriers to Discharge MD  Medical stability  Nursing Decreased caregiver support    PT Inaccessible home environment, Decreased caregiver support, Medical stability    OT      SLP      SW       Team Discharge Planning: Destination: PT-Home ,OT- Home , SLP-  Projected Follow-up: PT-Home health PT, OT-  Home health OT, SLP-  Projected Equipment Needs: PT-To be determined, OT- To be determined, SLP-  Equipment Details: PT- , OT-  Patient/family involved in discharge planning: PT- Patient,  OT-Patient, SLP-   MD ELOS: 17-20 days Medical Rehab Prognosis:  Excellent Assessment: The patient has been admitted for CIR therapies with the diagnosis of debility after multiple med/surg issues. The team will be addressing functional mobility, strength, stamina, balance, safety, adaptive techniques and equipment, self-care, bowel and bladder mgt, patient and caregiver education, pain mgt, ego support, wound care. Goals have been set at supervision for self-care and mobility.    [name], MD, FAAPMR      See Team Conference Notes for weekly updates to the plan of care

## 2018-08-09 NOTE — Progress Notes (Signed)
Occupational Therapy Session Note  Patient Details  Name: Melody Casey MRN: [id number] Date of Birth: Mar 24, 1946  Today's Date: 08/09/2018 OT Individual Time:  - 7:45-900 Total individual minutes= 75      Short Term Goals: Week 1:  OT Short Term Goal 1 (Week 1): Pt will complete toilet transfer with mod A of 1  OT Short Term Goal 2 (Week 1): Pt will perform 1 step of ostomy care with min A OT Short Term Goal 3 (Week 1): Pt will complete 1 step of LB dressing task.  OT Short Term Goal 4 (Week 1): Pt will tolerate 1 minute standing in stedy in preparation for BADL task  Skilled Therapeutic Interventions/Progress Updates: Patient's dtr stated [name] and her husband completed colostomy bag care with nursing already, but stated [name] had plans to complete 'burping' education with nursing when needed.  Patient fatigued but willing to participate in bathing and dressing and attempt stand or squat pivot transfer instead of using stedy.     She completed the stand pivot transfer x2 with CGA at begnning of session (bed to w/c) and with Min A and extra time at the end of the session (w/c to recliner)  Otherwise, patient winded and SOB throughout session and automatically  incorporated rest breaks but tried to keep working throughout the session.    Patient completed oral care and upper body bathing with setup and rest breaks (seated in w/c at sink) UB bathing and dressing with rest breaks and setup  LB bathing.  Patient deferred as she and dtr stated nursing had just changed brief before OT session started.   (This clinician washed patient legs and feet and donned her ted hose before she got out of bed)  At end of session, patient stated she was completely worn out but would rest in recliner with legs elevated.   She was left seated in recliner with call bell and phoen within reach and supportive dtr sitting nearby     Therapy Documentation Precautions:  Precautions Precautions:  Fall Precaution Comments: Colostomy  Restrictions Weight Bearing Restrictions: No  Pain:denied   See Function Navigator for Current Functional Status.   Therapy/Group: Individual Therapy  Herschell Dimes 08/09/2018, 3:31 PM

## 2018-08-09 NOTE — Progress Notes (Signed)
Occupational Therapy Session Note  Patient Details  Name: Melody Casey MRN: [id number] Date of Birth: 06/19/1946  Today's Date: 08/09/2018 OT Individual Time: 7209-4709 OT Individual Time Calculation (min): 60 min  and Today's Date: 08/09/2018 OT Missed Time: 15 Minutes Missed Time Reason: Patient fatigue   Short Term Goals: Week 1:  OT Short Term Goal 1 (Week 1): Pt will complete toilet transfer with mod A of 1  OT Short Term Goal 2 (Week 1): Pt will perform 1 step of ostomy care with min A OT Short Term Goal 3 (Week 1): Pt will complete 1 step of LB dressing task.  OT Short Term Goal 4 (Week 1): Pt will tolerate 1 minute standing in stedy in preparation for BADL task  Skilled Therapeutic Interventions/Progress Updates:    1;1. Pt received in bed initially declining all tx. Pt willing to complete UB therex, but states, "I just cant do all this transferring stuff. im too tired." Pt complete supine>sitting with touching A and VC for reciprocal scooting to EOB. Pt requires min intermittent A for sitting balance EOB. Pt completes 1x15 shoulder press, flexion, ab/adduct, int/ext rotation, horizontal ab/adduct, flex/ext elbow, and row with level 1 theraband and tactile/demonstration cueing. OT demo reacher, sock aide and shoe funnel use. Pt able to return demo use of reacher and sock aide on 1 foot to don non slip sock. Pt demo difficulty with shoe funnel with low frustration tolerance that non skid sock did not slide into shoe well. Pt sit to supine with MOD A for foot elevation and pt able to scoot up in bed with bridging/pulling with BUE. OT installs shoe laces in shoes do eliminate fastening. Exited session with pt seated in bed, call light in reach and exit alarm on.  Therapy Documentation Precautions:  Precautions Precautions: Fall Precaution Comments: Colostomy  Restrictions Weight Bearing Restrictions: No General: General OT Amount of Missed Time: 15 Minutes Vital Signs: Therapy  Vitals Temp: 97.9 F (36.6 C) Temp Source: Oral Pulse Rate: 99 Resp: 19 BP: 129/61 Patient Position (if appropriate): Lying Oxygen Therapy SpO2: 95 % O2 Device: Room Air   See Function Navigator for Current Functional Status.   Therapy/Group: Individual Therapy  Tonny Branch 08/09/2018, 3:58 PM

## 2018-08-09 NOTE — Progress Notes (Signed)
[facility] for Heparin >> warfarin Indication: atrial fibrillation  Allergies  Allergen Reactions  . Miralax [Polyethylene Glycol] Swelling and Rash    Took [facility] brand developed rash. Patient states she tolerated name brand MiraLax in the past.  09/01/15. Patient states she had diffuse swelling including swelling of her lips.   . Vancomycin Rash and Shortness Of Breath  . Acetaminophen Hives  . Oxycodone-Acetaminophen Itching  . Sulfa Antibiotics Rash    All over rash  . Sulfacetamide Sodium Rash    All over rash  . Sulfasalazine Rash    All over rash All over rash  . Banana Other (See Comments)    Unknown  . Latex Rash  . Tape Rash   Patient Measurements: Height: 5\' 8"  (172.7 cm) Weight: 197 lb 15.6 oz (89.8 kg) IBW/kg (Calculated) : 63.9 Heparin Dosing Weight: 76 kg  Vital Signs: Temp: 98.1 F (36.7 C) (08/31 0608) BP: 135/64 (08/31 0608) Pulse Rate: 89 (08/31 0608)  Labs: Recent Labs    08/07/18 0700 08/08/18 0511 08/09/18 0644  HGB 8.4* 8.0* 8.6*  HCT 26.3* 25.1* 27.6*  PLT 349 308 337  LABPROT 17.7* 19.3* 20.2*  INR 1.47 1.64 1.74  HEPARINUNFRC 0.47 0.39 0.23*  CREATININE 0.37* 0.36*  --    Estimated Creatinine Clearance: 72.4 mL/min (A) (by C-G formula based on SCr of 0.36 mg/dL (L)).  Assessment: 21 yoF on Coumadin 5mg  for Afib PTA. Heparin bridge given pt in Afib RVR and CHADsVAsc 7. INR subtherapeutic but trending up to 1.74. Last heparin level subtherapeutic at 0.23. Hgb 8.6, plts wnl.  No signs of bleeding or problems with heparin drip per nurse.   Goal of Therapy:  INR 2-3 Heparin level 0.3-0.5 Monitor platelets by anticoagulation protocol: Yes   Plan:  Increase heparin gtt at 1600 units/hr Continue Coumadin 5mg  PO x 1 tonight  Monitor daily heparin level / INR, CBC, s/s of bleed  Gwenlyn Found, [place] D PGY1 Pharmacy Resident  Phone [phone] 08/09/2018   8:31 AM

## 2018-08-09 NOTE — Progress Notes (Signed)
[place] PHYSICAL MEDICINE & REHABILITATION     PROGRESS NOTE    Subjective/Complaints: Had a reasonable night.  Has questions about her left leg and why she is having swelling there.  Denies pain in the left leg.  Tolerated vacuum treatments fairly well  ROS: Patient denies fever, rash, sore throat, blurred vision, nausea, vomiting, diarrhea, cough, shortness of breath or chest pain, joint or back pain, headache, or mood change.    Objective:  No results found. Recent Labs    08/08/18 0511 08/09/18 0644  WBC 5.0 4.8  HGB 8.0* 8.6*  HCT 25.1* 27.6*  PLT 308 337   Recent Labs    08/08/18 0511 08/09/18 0644  NA 131* 132*  K 3.6 3.8  CL 104 106  GLUCOSE 83 92  BUN 6* 6*  CREATININE 0.36* 0.39*  CALCIUM 8.5* 8.5*   CBG (last 3)  Recent Labs    08/08/18 1140 08/08/18 1644 08/09/18 0720  GLUCAP 107* 125* 76    Wt Readings from Last 3 Encounters:  08/06/18 89.8 kg  08/04/18 75.3 kg  07/22/18 75.8 kg     Intake/Output Summary (Last 24 hours) at 08/09/2018 0850 Last data filed at 08/08/2018 1846 Gross per 24 hour  Intake 720 ml  Output 50 ml  Net 670 ml    Vital Signs: Blood pressure 135/64, pulse 89, temperature 98.1 F (36.7 C), resp. rate 16, height 5\' 8"  (1.727 m), weight 89.8 kg, SpO2 97 %. Physical Exam:  Constitutional: No distress . Vital signs reviewed. HEENT: EOMI, oral membranes moist Neck: supple Cardiovascular: RRR without murmur. No JVD    Respiratory: CTA Bilaterally without wheezes or rales. 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  Motor: Bilateral upper: 4/5 proximal distal Right lower extremity: Hip flexion, knee extension 3+/5, ankle contracture Left lower cervical and hip flexion, knee extension 2/5, ankle 2+/5 --- stable  examination Skin: Skin is warm and dry.  Psychiatric: cooperative. A little flat   Assessment/Plan: 1. Functional deficits secondary to debility after colectomy which require 3+ hours per day of interdisciplinary therapy in a comprehensive inpatient rehab setting. Physiatrist is providing close team supervision and 24 hour management of active medical problems listed below. Physiatrist and rehab team continue to assess barriers to discharge/monitor patient progress toward functional and medical goals.  Function:  Bathing Bathing position Bathing activity did not occur: Refused Position: Wheelchair/chair at sink  Bathing parts Body parts bathed by patient: Right arm, Left arm, Chest, Right upper leg, Left upper leg Body parts bathed by helper: Front perineal area, Buttocks, Left lower leg, Right lower leg, Back  Bathing assist Assist Level: Touching or steadying assistance(Pt > 75%)      Upper Body Dressing/Undressing Upper body dressing Upper body dressing/undressing activity did not occur: Refused What is the patient wearing?: Pull over shirt/dress     Pull over shirt/dress - Perfomed by patient: Thread/unthread right sleeve, Put head through opening, Thread/unthread left sleeve Pull over shirt/dress - Perfomed by helper: Pull shirt over trunk        Upper body assist        Lower Body Dressing/Undressing Lower body dressing Lower body dressing/undressing activity did not occur: Refused What is the patient wearing?: Pants, Non-skid slipper socks       Pants- Performed by  helper: Thread/unthread right pants leg, Thread/unthread left pants leg, Pull pants up/down   Non-skid slipper socks- Performed by helper: Don/doff left sock, Don/doff right sock                  Lower body assist Assist for lower body dressing: Touching or steadying assistance (Pt > 75%)      Toileting Toileting   Toileting steps completed by patient: Adjust clothing prior to toileting, Adjust  clothing after toileting Toileting steps completed by helper: Performs perineal hygiene Toileting Assistive Devices: Grab bar or rail  Toileting assist Assist level: Touching or steadying assistance (Pt.75%)   Transfers Chair/bed transfer     Chair/bed transfer assist level: 2 helpers       Locomotion Ambulation     Max distance: 5 Assist level: Moderate assist (Pt 50 - 74%)   Wheelchair          Cognition Comprehension Comprehension assist level: Follows complex conversation/direction with extra time/assistive device  Expression Expression assist level: Expresses complex ideas: With extra time/assistive device  Social Interaction Social Interaction assist level: Interacts appropriately with others with medication or extra time (anti-anxiety, antidepressant).  Problem Solving Problem solving assist level: Solves complex problems: With extra time  Memory Memory assist level: Recognizes or recalls 90% of the time/requires cueing < 10% of the time   Medical Problem List and Plan: 1.  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  DVT Prophylaxis/Anticoagulation: Heparin bridge to chronic Coumadin resumed for history of atrial fibrillation 3. Pain Management: Ultram as needed 4. Mood: Xanax 0.25 mg 3 times daily as needed  -Neuropsych consult is requested  -Mood seems more upbeat today 5. Neuropsych: This patient is capable of making decisions on her own behalf.  -team to provide ego support as needed 6. Skin/Wound Care:    -Vacuum therapy to midline wound 7. Fluids/Electrolytes/Nutrition: encourae PO  -sodium trending up 132 8.  Acute blood loss anemia.  Follow-up CBC hgb up to 8.4 9.  Atrial fibrillation.  Cardizem 360 mg daily, Tikosyn 250 mg twice daily.  Cardiac rate controlled  -keep potassium 4.0 or greater 10.  Hypertension.   Norvasc 2.5 mg daily 11.  Rheumatoid arthritis.  Plaquenil 200 mg twice daily 12.  Hyperlipidemia.  Lipitor 13.  Left lower extremity edema: Dopplers from last week were negative.  She has no pain no limb.  I assume this is generally dependent edema and partially related to nutrition.  -Encouraged increased protein intake as well as elevation of the leg when possible.   -We will continue with TED stockings,  Ace wrap if needed   LOS (Days) [place]  [name], MD 08/09/2018 8:50 AM

## 2018-08-09 NOTE — Progress Notes (Deleted)
Occupational Therapy Session Note  Patient Details  Name: Melody Casey MRN: [id number] Date of Birth: Jun 11, 1946  Today's Date: 08/09/2018 OT Individual Time: 0745-0900 OT Individual Time Calculation (min): 75 min    Skilled Therapeutic Interventions/Progress Updates: participation in skilled OT to increase endurance, mobility, self care and goals as below: Supine to edge of bed=extra time and S Supine to w/c stand pivot= Min A and extra time UB bathing setup and extra time  LB batheing dependent as patient winded and c/o extreme fatigue during the whole session UB dressing setup and extra time due to fatigue LB dressing dependent due to fatigue and patient guarding tummy muscles W/c to recliner transfer= extra time and max A stand pivot     Therapy Documentation Precautions:  Precautions Precautions: Fall Precaution Comments: Colostomy  Restrictions Weight Bearing Restrictions: No  Pain: Pain Assessment Pain Scale: 0-10 Pain Score: 0-No pain Pain Location: Abdomen Pain Orientation: Mid Pain Intervention(s): Medication (See eMAR)  See Function Navigator for Current Functional Status.   Therapy/Group: Individual Therapy  Alfredia Ferguson [facility] 08/09/2018, 9:47 AM

## 2018-08-10 ENCOUNTER — Inpatient Hospital Stay (HOSPITAL_COMMUNITY): Payer: Medicare Other

## 2018-08-10 LAB — BASIC METABOLIC PANEL
ANION GAP: 3 — AB (ref 5–15)
BUN: 6 mg/dL — AB (ref 8–23)
CHLORIDE: 106 mmol/L (ref 98–111)
CO2: 24 mmol/L (ref 22–32)
Calcium: 8.5 mg/dL — ABNORMAL LOW (ref 8.9–10.3)
Creatinine, Ser: 0.37 mg/dL — ABNORMAL LOW (ref 0.44–1.00)
GFR calc Af Amer: >60 mL/min (ref >60)
Glucose, Bld: 88 mg/dL (ref 70–99)
POTASSIUM: 3.8 mmol/L (ref 3.5–5.1)
SODIUM: 133 mmol/L — AB (ref 135–145)

## 2018-08-10 LAB — GLUCOSE, CAPILLARY: GLUCOSE-CAPILLARY: 76 mg/dL (ref 70–99)

## 2018-08-10 LAB — MAGNESIUM: MAGNESIUM: 1.5 mg/dL — AB (ref 1.7–2.4)

## 2018-08-10 LAB — CBC
HCT: 26.6 % — ABNORMAL LOW (ref 36.0–46.0)
HEMOGLOBIN: 8.5 g/dL — AB (ref 12.0–15.0)
MCH: 27.5 pg (ref 26.0–34.0)
MCHC: 32 g/dL (ref 30.0–36.0)
MCV: 86.1 fL (ref 78.0–100.0)
PLATELETS: 294 10*3/uL (ref 150–400)
RBC: 3.09 MIL/uL — AB (ref 3.87–5.11)
RDW: 18.2 % — ABNORMAL HIGH (ref 11.5–15.5)
WBC: 4.1 10*3/uL (ref 4.0–10.5)

## 2018-08-10 LAB — PROTIME-INR
INR: 1.78
Prothrombin Time: 20.6 seconds — ABNORMAL HIGH (ref 11.4–15.2)

## 2018-08-10 LAB — HEPARIN LEVEL (UNFRACTIONATED): Heparin Unfractionated: 0.65 IU/mL (ref 0.30–0.70)

## 2018-08-10 MED ORDER — WARFARIN SODIUM 3 MG PO TABS: 6.0000 mg | ORAL_TABLET | Freq: Once | ORAL | Status: DC

## 2018-08-10 MED ORDER — POTASSIUM CHLORIDE CRYS ER 20 MEQ PO TBCR
20.0000 meq | EXTENDED_RELEASE_TABLET | Freq: Once | ORAL | Status: AC
  Administered 2018-08-10: 20 meq via ORAL
  Filled 2018-08-10: qty 1

## 2018-08-10 MED ORDER — WARFARIN SODIUM 7.5 MG PO TABS
7.5000 mg | ORAL_TABLET | Freq: Once | ORAL | Status: AC
  Administered 2018-08-10: 7.5 mg via ORAL
  Filled 2018-08-10: qty 1

## 2018-08-10 MED ORDER — MAGNESIUM SULFATE 4 GM/100ML IV SOLN
4.0000 g | Freq: Once | INTRAVENOUS | Status: AC
  Administered 2018-08-10: 4 g via INTRAVENOUS
  Filled 2018-08-10 (×2): qty 100

## 2018-08-10 MED ORDER — POTASSIUM CHLORIDE CRYS ER 20 MEQ PO TBCR
40.0000 meq | EXTENDED_RELEASE_TABLET | Freq: Two times a day (BID) | ORAL | Status: DC
  Administered 2018-08-10 – 2018-08-23 (×26): 40 meq via ORAL
  Filled 2018-08-10 (×26): qty 2

## 2018-08-10 MED ORDER — MAGNESIUM OXIDE 400 (241.3 MG) MG PO TABS
400.0000 mg | ORAL_TABLET | Freq: Every day | ORAL | Status: DC
  Administered 2018-08-10 – 2018-08-15 (×6): 400 mg via ORAL
  Filled 2018-08-10 (×6): qty 1

## 2018-08-10 NOTE — Progress Notes (Addendum)
[place] PHYSICAL MEDICINE & REHABILITATION     PROGRESS NOTE    Subjective/Complaints: Overall feeling better but still does not have her energy back.  ROS: Patient denies fever, rash, sore throat, blurred vision, nausea, vomiting, diarrhea, cough, shortness of breath or chest pain, joint or back pain, headache, or mood change. .    Objective:  No results found. Recent Labs    08/09/18 0644 08/10/18 0619  WBC 4.8 4.1  HGB 8.6* 8.5*  HCT 27.6* 26.6*  PLT 337 294   Recent Labs    08/09/18 0644 08/10/18 0619  NA 132* 133*  K 3.8 3.8  CL 106 106  GLUCOSE 92 88  BUN 6* 6*  CREATININE 0.39* 0.37*  CALCIUM 8.5* 8.5*   CBG (last 3)  Recent Labs    08/09/18 1639 08/09/18 2105 08/10/18 0634  GLUCAP 93 99 76    Wt Readings from Last 3 Encounters:  08/06/18 89.8 kg  08/04/18 75.3 kg  07/22/18 75.8 kg     Intake/Output Summary (Last 24 hours) at 08/10/2018 0917 Last data filed at 08/10/2018 0600 Gross per 24 hour  Intake 480 ml  Output 290 ml  Net 190 ml    Vital Signs: Blood pressure (!) 159/82, pulse (!) 102, temperature 98.2 F (36.8 C), resp. rate 17, height 5\' 8"  (1.727 m), weight 89.8 kg, SpO2 98 %. Physical Exam:  Constitutional: No distress . Vital signs reviewed. HEENT: EOMI, oral membranes moist Neck: supple Cardiovascular: RRR without murmur. No JVD    Respiratory: CTA Bilaterally without wheezes or rales. 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  Motor: Bilateral upper: 4/5 proximal distal Right lower extremity: Hip flexion, knee extension 3+/5, ankle contracture Left lower cervical and hip flexion, knee extension 2/5, ankle 2+/5 --unchanged examination Skin: Skin is warm and dry.  Psychiatric: cooperative, in better spirits    Assessment/Plan: 1. Functional deficits secondary to debility after colectomy which require 3+ hours per day of interdisciplinary therapy in a comprehensive inpatient rehab setting. Physiatrist is providing close team supervision and 24 hour management of active medical problems listed below. Physiatrist and rehab team continue to assess barriers to discharge/monitor patient progress toward functional and medical goals.  Function:  Bathing Bathing position Bathing activity did not occur: Refused Position: Wheelchair/chair at sink  Bathing parts Body parts bathed by patient: Right arm, Left arm, Chest, Right upper leg, Left upper leg Body parts bathed by helper: Front perineal area, Buttocks, Left lower leg, Right lower leg, Back  Bathing assist Assist Level: Touching or steadying assistance(Pt > 75%)      Upper Body Dressing/Undressing Upper body dressing Upper body dressing/undressing activity did not occur: Refused What is the patient wearing?: Pull over shirt/dress     Pull over shirt/dress - Perfomed by patient: Thread/unthread right sleeve, Put head through opening, Thread/unthread left sleeve Pull over shirt/dress - Perfomed by helper: Pull shirt over trunk        Upper body assist        Lower Body Dressing/Undressing Lower body dressing Lower body dressing/undressing activity did not occur: Refused What is the patient wearing?: Pants, Non-skid slipper socks       Pants- Performed by helper: Thread/unthread right pants leg, Thread/unthread left pants leg, Pull pants up/down   Non-skid slipper socks- Performed  by helper: Don/doff left sock, Don/doff right sock                  Lower body assist Assist for lower body dressing: Touching or steadying assistance (Pt > 75%)      Toileting Toileting   Toileting steps completed by patient: Adjust clothing prior to toileting, Adjust clothing after toileting Toileting steps completed by helper: Adjust clothing  prior to toileting, Performs perineal hygiene, Adjust clothing after toileting Toileting Assistive Devices: Prosthesis/orthosis  Toileting assist Assist level: Touching or steadying assistance (Pt.75%)   Transfers Chair/bed transfer     Chair/bed transfer assist level: 2 helpers       Locomotion Ambulation     Max distance: 5 Assist level: Moderate assist (Pt 50 - 74%)   Wheelchair          Cognition Comprehension Comprehension assist level: Follows complex conversation/direction with no assist  Expression Expression assist level: Expresses complex ideas: With no assist  Social Interaction Social Interaction assist level: Interacts appropriately with others - No medications needed.  Problem Solving Problem solving assist level: Solves complex problems: Recognizes & self-corrects  Memory Memory assist level: Complete Independence: No helper   Medical Problem List and Plan: 1.  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  DVT Prophylaxis/Anticoagulation: Heparin bridge to chronic Coumadin resumed for history of atrial fibrillation 3. Pain Management: Ultram as needed 4. Mood: Xanax 0.25 mg 3 times daily as needed  -Neuropsych consult is requested  -Continue to provide positive reinforcement 5. Neuropsych: This patient is capable of making decisions on her own behalf.  -team to provide ego support as needed 6. Skin/Wound Care:    -Vacuum therapy to midline wound 7. Fluids/Electrolytes/Nutrition: encourae PO  -sodium trending up 133 on 9/1 8.  Acute blood loss anemia.  Follow-up CBC hgb up to 8.5 on 9/1 9.  Atrial fibrillation.  Cardizem 360 mg daily, Tikosyn 250 mg twice daily.  Cardiac rate controlled  -Potassium still 3.8 today despite supplementation, increased daily potassium to 40 mEq twice daily  -Recheck bmet  tomorrow  -supplement magnesium, follow up level also tomorrow 10.  Hypertension.  Norvasc 2.5 mg daily 11.  Rheumatoid arthritis.  Plaquenil 200 mg twice daily 12.  Hyperlipidemia.  Lipitor 13.  Left lower extremity edema: Dopplers from last week were negative.  She has no pain no limb.  I assume this is generally dependent edema and partially related to nutrition.  -Encouraged increased protein intake as well as elevation of the leg when possible.   -We will continue with TED stockings,  ACE wrap if needed   LOS (Days) [place]  [name], MD 08/10/2018 9:17 AM

## 2018-08-10 NOTE — Progress Notes (Addendum)
[place] for Heparin >> warfarin Indication: atrial fibrillation  Allergies  Allergen Reactions  . Miralax [Polyethylene Glycol] Swelling and Rash    Took [facility] brand developed rash. Patient states she tolerated name brand MiraLax in the past.  09/01/15. Patient states she had diffuse swelling including swelling of her lips.   . Vancomycin Rash and Shortness Of Breath  . Acetaminophen Hives  . Oxycodone-Acetaminophen Itching  . Sulfa Antibiotics Rash    All over rash  . Sulfacetamide Sodium Rash    All over rash  . Sulfasalazine Rash    All over rash All over rash  . Banana Other (See Comments)    Unknown  . Latex Rash  . Tape Rash   Patient Measurements: Height: 5\' 8"  (172.7 cm) Weight: 197 lb 15.6 oz (89.8 kg) IBW/kg (Calculated) : 63.9 Heparin Dosing Weight: 76 kg  Vital Signs: Temp: 98.2 F (36.8 C) (09/01 0525) BP: 159/82 (09/01 0525) Pulse Rate: 102 (09/01 0525)  Labs: Recent Labs    08/08/18 0511 08/09/18 0644 08/10/18 0619  HGB 8.0* 8.6* 8.5*  HCT 25.1* 27.6* 26.6*  PLT 308 337 294  LABPROT 19.3* 20.2*  --   INR 1.64 1.74  --   HEPARINUNFRC 0.39 0.23* 0.65  CREATININE 0.36* 0.39* 0.37*   Estimated Creatinine Clearance: 72.4 mL/min (A) (by C-G formula based on SCr of 0.37 mg/dL (L)).  Assessment: 21 yoF on Coumadin 5mg  for Afib PTA. Heparin bridge given pt in Afib RVR and CHADsVAsc 7. INR subtherapeutic with a slight increase up to 1.78. Last heparin level supra threapeutic at 0.65. Hgb stable low at 8.5, plts wnl.  PO intake has increased in the last couple days to 70-100%.   No signs of bleeding or problems with heparin drip per nurse.   Goal of Therapy:  INR 2-3 Heparin level 0.3-0.5 (lower goal) Monitor platelets by anticoagulation protocol: Yes   Plan:  Decrease heparin gtt to 1550 units/hr Will increase warfarin 7.5 mg x1 tonight considering consistantly subtherapeutic INR and increased PO intake.    Monitor daily heparin level / INR, CBC, s/s of bleed  Gwenlyn Found, [place] D PGY1 Pharmacy Resident  Phone [phone] 08/10/2018   7:49 AM

## 2018-08-10 NOTE — Progress Notes (Signed)
Occupational Therapy Session Note  Patient Details  Name: Melody Casey MRN: [id number] Date of Birth: 1946/08/24  Today's Date: 08/10/2018 OT Individual Time: 6568-1275 OT Individual Time Calculation (min): 57 min    Short Term Goals: Week 1:  OT Short Term Goal 1 (Week 1): Pt will complete toilet transfer with mod A of 1  OT Short Term Goal 2 (Week 1): Pt will perform 1 step of ostomy care with min A OT Short Term Goal 3 (Week 1): Pt will complete 1 step of LB dressing task.  OT Short Term Goal 4 (Week 1): Pt will tolerate 1 minute standing in stedy in preparation for BADL task  Skilled Therapeutic Interventions/Progress Updates:    1:1. Pt with c/o pain in abdomin (increasing with weight shifting). RN delivers pain medication. Pt supine>soitting EOB with bed rails and superviison. EOB, pt dons socks and shoes with reacher, sock aide and shoe funnel with mod instrucitonal cueing. Pt requires intermittent min a for sititng balance EOB with lean L. Pt brushes teeth with set up at sink seated in w/c. Pt completes UB therext towel glides with 2# wrist weight on BUE forward back, horizontal and circles in B directions. With 1# dumbell pt completes 1x10 ab/adduction and flexion extention. Attempted to have pt sit to stand at high low table however pt reporting too much pain with forward flexion. Exited session wihtpt seated in w/c call light in reacha nd all needs met  Therapy Documentation Precautions:  Precautions Precautions: Fall Precaution Comments: Colostomy  Restrictions Weight Bearing Restrictions: No General:   Vital Signs:  Pain: Pain Assessment Pain Scale: 0-10 Pain Score: 4  Pain Type: Acute pain Pain Location: Abdomen Pain Orientation: Mid Pain Descriptors / Indicators: Aching Pain Frequency: Intermittent Pain Onset: On-going Patients Stated Pain Goal: 1 Pain Intervention(s): Medication (See eMAR) ADL:  See Function Navigator for Current Functional  Status.   Therapy/Group: Individual Therapy  Tonny Branch 08/10/2018, 2:00 PM

## 2018-08-11 ENCOUNTER — Inpatient Hospital Stay (HOSPITAL_COMMUNITY): Payer: Medicare Other | Admitting: Physical Therapy

## 2018-08-11 ENCOUNTER — Inpatient Hospital Stay (HOSPITAL_COMMUNITY): Payer: Medicare Other | Admitting: Occupational Therapy

## 2018-08-11 ENCOUNTER — Inpatient Hospital Stay (HOSPITAL_COMMUNITY): Payer: Medicare Other

## 2018-08-11 DIAGNOSIS — E876 Hypokalemia: Secondary | ICD-10-CM

## 2018-08-11 DIAGNOSIS — I4891 Unspecified atrial fibrillation: Secondary | ICD-10-CM

## 2018-08-11 DIAGNOSIS — M069 Rheumatoid arthritis, unspecified: Secondary | ICD-10-CM

## 2018-08-11 DIAGNOSIS — R600 Localized edema: Secondary | ICD-10-CM

## 2018-08-11 DIAGNOSIS — D62 Acute posthemorrhagic anemia: Secondary | ICD-10-CM

## 2018-08-11 LAB — BASIC METABOLIC PANEL
Anion gap: 4 — ABNORMAL LOW (ref 5–15)
BUN: 6 mg/dL — ABNORMAL LOW (ref 8–23)
CALCIUM: 8.5 mg/dL — AB (ref 8.9–10.3)
CO2: 22 mmol/L (ref 22–32)
CREATININE: 0.48 mg/dL (ref 0.44–1.00)
Chloride: 106 mmol/L (ref 98–111)
GFR calc non Af Amer: >60 mL/min (ref >60)
Glucose, Bld: 88 mg/dL (ref 70–99)
Potassium: 4.3 mmol/L (ref 3.5–5.1)
SODIUM: 132 mmol/L — AB (ref 135–145)

## 2018-08-11 LAB — PROTIME-INR
INR: 2.03
Prothrombin Time: 22.7 seconds — ABNORMAL HIGH (ref 11.4–15.2)

## 2018-08-11 LAB — CBC
HCT: 26.4 % — ABNORMAL LOW (ref 36.0–46.0)
Hemoglobin: 8.3 g/dL — ABNORMAL LOW (ref 12.0–15.0)
MCH: 27 pg (ref 26.0–34.0)
MCHC: 31.4 g/dL (ref 30.0–36.0)
MCV: 86 fL (ref 78.0–100.0)
Platelets: 278 10*3/uL (ref 150–400)
RBC: 3.07 MIL/uL — ABNORMAL LOW (ref 3.87–5.11)
RDW: 18.2 % — AB (ref 11.5–15.5)
WBC: 3.9 10*3/uL — ABNORMAL LOW (ref 4.0–10.5)

## 2018-08-11 LAB — HEPARIN LEVEL (UNFRACTIONATED): HEPARIN UNFRACTIONATED: 0.63 [IU]/mL (ref 0.30–0.70)

## 2018-08-11 LAB — MAGNESIUM: Magnesium: 1.8 mg/dL (ref 1.7–2.4)

## 2018-08-11 MED ORDER — WARFARIN SODIUM 7.5 MG PO TABS
7.5000 mg | ORAL_TABLET | Freq: Once | ORAL | Status: AC
  Administered 2018-08-11: 7.5 mg via ORAL
  Filled 2018-08-11: qty 1

## 2018-08-11 NOTE — Progress Notes (Signed)
Physical Therapy Session Note  Patient Details  Name: Melody Casey MRN: [id number] Date of Birth: 04-18-1946  Today's Date: 08/11/2018 PT Individual Time: 0900-0945 PT Individual Time Calculation (min): 45 min   Short Term Goals: Week 1:  PT Short Term Goal 1 (Week 1): pt will perform functional transfers with max A PT Short Term Goal 2 (Week 1): pt will perform gait 10' in controlled environment with mod A  Skilled Therapeutic Interventions/Progress Updates:    pt in bed stating need to use restroom.  Supine to sit with min A, mod A to stand with stedy.  Toilet transfers mod A with stedy, max A for clothing management.  Pt performs squat pivot transfer to nustep with mod A.  nustep x 6 minutes level 4 for UE/LE strength and endurance. Pt able to brush teeth at sink with set up assist. Pt left in room with needs at hand.  Therapy Documentation Precautions:  Precautions Precautions: Fall Precaution Comments: Colostomy  Restrictions Weight Bearing Restrictions: No  Pain: Pt c/o ongoing pain since wound care this morning, states she rec'd pain meds.  Rest and repositioned throughout treatment   Therapy/Group: Individual Therapy  Yaritsa Savarino 08/11/2018, 9:45 AM

## 2018-08-11 NOTE — Plan of Care (Signed)
  Problem: RH BOWEL ELIMINATION Goal: RH STG MANAGE BOWEL WITH ASSISTANCE Description STG Manage Bowel with Assistance. Mod assist to manage ostomy  Outcome: Progressing   Problem: RH BLADDER ELIMINATION Goal: RH STG MANAGE BLADDER WITH ASSISTANCE Description STG Manage Bladder With Assistance. Min assist  Outcome: Progressing   Problem: RH SKIN INTEGRITY Goal: RH STG MAINTAIN SKIN INTEGRITY WITH ASSISTANCE Description STG Maintain Skin Integrity With Assistance. Mod assist  Outcome: Progressing Goal: RH STG ABLE TO PERFORM INCISION/WOUND CARE W/ASSISTANCE Description STG Able To Perform Incision/Wound Care With Assistance. Mod assist  Outcome: Progressing   Problem: RH PAIN MANAGEMENT Goal: RH STG PAIN MANAGED AT OR BELOW PT'S PAIN GOAL Description Less than 4  Outcome: Progressing   Problem: Consults Goal: RH GENERAL PATIENT EDUCATION Description See Patient Education module for education specifics. Outcome: Progressing   Problem: Education: Goal: Knowledge of ostomy care will improve Outcome: Progressing Goal: Understanding of discharge needs will improve Outcome: Progressing   Problem: Bowel/Gastric/Urinary: Goal: Gastrointestinal status for postoperative course will improve Outcome: Progressing

## 2018-08-11 NOTE — Consult Note (Addendum)
[facility] Nurse wound follow up:  CCS PA Saverio Danker in to see patient with this Probation officer.  See her note and accompanying photo. Wound type:Surgical Measurement: No change since Friday, 08/08/18: 19cm x 6.5cm x 8.5cm with 3 small openings at the proximal wound. Wound bed: 80% beefy red tissue, 20% white/yellow tissue at base of distal (deepest portion) of wound. Friable tissue at wound edges, bleeding. Drainage (amount, consistency, odor) See above. Periwound: Intact Dressing procedure/placement/frequency: Four (4) pieces of white foam and 1 piece of black foam removed from wound bed. Wound cleansed with NS.  Three (3) strips of white foam used to obliterate dead space in three pockets of proximal wound.  One (1) piece of white foam placed into wound bed at distal wound.  Four pieces of white foam total.  One (1) piece of black foam used to cover wound. Sealed with drape. Seal obtained at 170mmHg continuous negative pressure. While patient experienced brief discomfort during assessment and filling of distal portion of wound, she tolerated the procedure well.   [facility] Nurse ostomy follow up:  POD 17;  S/P Hartman's (left colectomy, end transverse colostomy), Dr. Donne Hazel, 08/16  Stoma type/location:LUQ end transverse colostomy  Stomal assessment/size: Edematous, pale pink, dry, budded stoma.  2 and 1/4 inch round. Peristomal assessment: Intact  Treatment options for stomal/peristomal skin: one skin barrier ring to encircle ostomy, Two (2) half-rings at 9 o'clock. Output: light brown pasty stool. Ostomy pouching: 1pc.flat pouch with skin barrier rings as described above.  Education provided: No family in room today. Patient is tired from restless night last night and requests pain medication so that she can rest before therapy begins in 1 hour. Bedside RN in to medicate patient. Patient expresses concern that her LLE is edematous despite elevation throughout the night. CCS PA defers management of that  complaint and condition to the medical team. No ostomy teaching today. Enrolled patient in [place] Start Discharge program: Yes, previously   [facility] nursing team will follow along for M/W/F NPWT dressing and simultaneous ostomy pouch changes, and will remain available to this patient, her family, the nursing, surgical and medical teams.   Thanks, Maudie Flakes, MSN, RN, [place], Arther Abbott  Pager# [phone]

## 2018-08-11 NOTE — Progress Notes (Addendum)
[facility] PHYSICAL MEDICINE & REHABILITATION     PROGRESS NOTE    Subjective/Complaints: Patient seen sitting up in bed this morning. She states she did not sleep well overnight due to an episode of incontinence. This morning, she states she needs to use a bedpan.  ROS: Denies CP, SOB, N/V/D  Objective:  No results found. Recent Labs    08/10/18 0619 08/11/18 0614  WBC 4.1 3.9*  HGB 8.5* 8.3*  HCT 26.6* 26.4*  PLT 294 278   Recent Labs    08/10/18 0619 08/11/18 0614  NA 133* 132*  K 3.8 4.3  CL 106 106  GLUCOSE 88 88  BUN 6* 6*  CREATININE 0.37* 0.48  CALCIUM 8.5* 8.5*   CBG (last 3)  Recent Labs    08/09/18 1639 08/09/18 2105 08/10/18 0634  GLUCAP 93 99 76    Wt Readings from Last 3 Encounters:  08/06/18 89.8 kg  08/04/18 75.3 kg  07/22/18 75.8 kg     Intake/Output Summary (Last 24 hours) at 08/11/2018 0859 Last data filed at 08/11/2018 0733 Gross per 24 hour  Intake 1178.3 ml  Output -  Net 1178.3 ml    Vital Signs: Blood pressure (!) 144/86, pulse 89, temperature 98.4 F (36.9 C), temperature source Oral, resp. rate 18, height 5\' 8"  (1.727 m), weight 89.8 kg, SpO2 97 %. Physical Exam:  Constitutional: No distress . Vital signs reviewed. HENT: Normocephalic.  Atraumatic. Eyes: EOMI. No discharge. Cardiovascular: RRR. No JVD. Respiratory: CTA Bilaterally. Normal effort. GI: BS +, nondistended. Ostomy in place Musc: No edema or tenderness in extremities. Musculoskeletal:  Left lower extremity edema Right foot atrophy with contracture Neurological: She is alert and oriented Motor: Bilateral upper: 4/5 proximal distal Right lower extremity: Hip flexion, knee extension 3+/5, ankle contracture Left lower extremity: Hip flexion, knee extension 2+/5, ankle 2+/5  Skin: Skin is warm and dry.  Psychiatric: cooperative, in better spirits   Assessment/Plan: 1. Functional deficits secondary to debility after colectomy which require 3+ hours per day of  interdisciplinary therapy in a comprehensive inpatient rehab setting. Physiatrist is providing close team supervision and 24 hour management of active medical problems listed below. Physiatrist and rehab team continue to assess barriers to discharge/monitor patient progress toward functional and medical goals.  Function:  Bathing Bathing position Bathing activity did not occur: Refused Position: Wheelchair/chair at sink  Bathing parts Body parts bathed by patient: Right arm, Left arm, Chest, Right upper leg, Left upper leg Body parts bathed by helper: Front perineal area, Buttocks, Left lower leg, Right lower leg, Back  Bathing assist Assist Level: Touching or steadying assistance(Pt > 75%)      Upper Body Dressing/Undressing Upper body dressing Upper body dressing/undressing activity did not occur: Refused What is the patient wearing?: Pull over shirt/dress     Pull over shirt/dress - Perfomed by patient: Thread/unthread right sleeve, Put head through opening, Thread/unthread left sleeve Pull over shirt/dress - Perfomed by helper: Pull shirt over trunk        Upper body assist        Lower Body Dressing/Undressing Lower body dressing Lower body dressing/undressing activity did not occur: Refused What is the patient wearing?: Pants, Non-skid slipper socks       Pants- Performed by helper: Thread/unthread right pants leg, Thread/unthread left pants leg, Pull pants up/down   Non-skid slipper socks- Performed by helper: Don/doff left sock, Don/doff right sock  Lower body assist Assist for lower body dressing: Touching or steadying assistance (Pt > 75%)      Toileting Toileting   Toileting steps completed by patient: Adjust clothing prior to toileting, Adjust clothing after toileting Toileting steps completed by helper: Adjust clothing prior to toileting, Performs perineal hygiene, Adjust clothing after toileting Toileting Assistive Devices:  Prosthesis/orthosis  Toileting assist Assist level: Touching or steadying assistance (Pt.75%)   Transfers Chair/bed transfer     Chair/bed transfer assist level: 2 helpers       Locomotion Ambulation     Max distance: 5 Assist level: Moderate assist (Pt 50 - 74%)   Wheelchair          Cognition Comprehension Comprehension assist level: Follows complex conversation/direction with no assist  Expression Expression assist level: Expresses complex ideas: With no assist  Social Interaction Social Interaction assist level: Interacts appropriately with others - No medications needed.  Problem Solving Problem solving assist level: Solves complex problems: Recognizes & self-corrects  Memory Memory assist level: Complete Independence: No helper   Medical Problem List and Plan: 1.  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  DVT Prophylaxis/Anticoagulation: Heparin bridge to chronic Coumadin resumed for history of atrial fibrillation  INR therapeutic this AM 3. Pain Management: Ultram as needed 4. Mood: Xanax 0.25 mg 3 times daily as needed  -Neuropsych consult is requested  -Continue to provide positive reinforcement 5. Neuropsych: This patient is capable of making decisions on her own behalf.  -team to provide ego support as needed 6. Skin/Wound Care:    -Vacuum therapy to midline wound 7. Fluids/Electrolytes/Nutrition: encourae PO 8.  Acute blood loss anemia.    hgb 8.3 on 9/2 9.  Atrial fibrillation.  Cardizem 360 mg daily, Tikosyn 250 mg twice daily.  Cardiac rate controlled 10.  Hypertension.  Norvasc 2.5 mg daily  Relatively controlled on 9/2 11.  Rheumatoid arthritis.  Plaquenil 200 mg twice daily 12.  Hyperlipidemia.  Lipitor 13.  Left lower extremity  edema: Dopplers from last week were negative.  She has no pain no limb.    Encouraged increased protein intake as well as elevation of the leg when possible.   -We will continue with TED stockings,  ACE wrap if needed 14. Hyponatremia  Sodium 132 on 9/2  Cont to monitor 15. Hypokalemia  Potassium 4.3 on 9/2  Cont daily potassium to 40 mEq twice daily  Mag 1.8 on 9/2    LOS (Days) 5 A FACE TO FACE EVALUATION WAS PERFORMED  Jaxson Keener Lorie Phenix, MD 08/11/2018 8:59 AM

## 2018-08-11 NOTE — Progress Notes (Signed)
Occupational Therapy Session Note  Patient Details  Name: Melody Casey MRN: [id number] Date of Birth: 1946-05-14  Today's Date: 08/11/2018 OT Individual Time: 1030-1100 OT Individual Time Calculation (min): 30 min    Short Term Goals: Week 1:  OT Short Term Goal 1 (Week 1): Pt will complete toilet transfer with mod A of 1  OT Short Term Goal 2 (Week 1): Pt will perform 1 step of ostomy care with min A OT Short Term Goal 3 (Week 1): Pt will complete 1 step of LB dressing task.  OT Short Term Goal 4 (Week 1): Pt will tolerate 1 minute standing in stedy in preparation for BADL task  Skilled Therapeutic Interventions/Progress Updates:    Pt resting in w/c upon arrival with husband present.  Pt states she didn't sleep well last night but she was going to "try to make it" through the day.  Pt engaged in BUE therex with 1# weight bar for rowing and 3# bar for biceps curls.  Pt performed 3 set X 10 forward and reverse of rowing and 3 set X 10 of biceps curls.  Pt required extended rest breaks between sets.  Weight bars left in room for pt to perform exercises during the day.  Pt receptive to idea and husband indicated he would encourage.  Pt remained in w/c with all needs within reach and husband present.   Therapy Documentation Precautions:  Precautions Precautions: Fall Precaution Comments: Colostomy  Restrictions Weight Bearing Restrictions: No  Pain: Pt c/o 5/10 abdominal pain following dressing change; RN aware and repositioned, emotional support  See Function Navigator for Current Functional Status.   Therapy/Group: Individual Therapy  Leroy Libman 08/11/2018, 11:29 AM

## 2018-08-11 NOTE — Progress Notes (Signed)
Physical Therapy Session Note  Patient Details  Name: Melody Casey MRN: [id number] Date of Birth: 05/29/1946  Today's Date: 08/11/2018 PT Missed Time: 75 Minutes Missed Time Reason: Patient fatigue   Pt refused participation 2/2 fatigue. Returned at agreed upon time later in day to make-up session, pt continued to refuse, stating "I'm tired and need to rest, I don't have anything in me today".   Melody Casey 08/11/2018, 4:03 PM

## 2018-08-11 NOTE — Progress Notes (Signed)
[place] for Heparin >> warfarin Indication: atrial fibrillation  Allergies  Allergen Reactions  . Miralax [Polyethylene Glycol] Swelling and Rash    Took [facility] brand developed rash. Patient states she tolerated name brand MiraLax in the past.  09/01/15. Patient states she had diffuse swelling including swelling of her lips.   . Vancomycin Rash and Shortness Of Breath  . Acetaminophen Hives  . Oxycodone-Acetaminophen Itching  . Sulfa Antibiotics Rash    All over rash  . Sulfacetamide Sodium Rash    All over rash  . Sulfasalazine Rash    All over rash All over rash  . Banana Other (See Comments)    Unknown  . Latex Rash  . Tape Rash   Patient Measurements: Height: 5\' 8"  (172.7 cm) Weight: 197 lb 15.6 oz (89.8 kg) IBW/kg (Calculated) : 63.9 Heparin Dosing Weight: 76 kg  Vital Signs: Temp: 98.4 F (36.9 C) (09/02 0500) Temp Source: Oral (09/02 0500) BP: 144/86 (09/02 0500) Pulse Rate: 89 (09/02 0500)  Labs: Recent Labs    08/09/18 0644 08/10/18 0619 08/11/18 0614  HGB 8.6* 8.5* 8.3*  HCT 27.6* 26.6* 26.4*  PLT 337 294 278  LABPROT 20.2* 20.6* 22.7*  INR 1.74 1.78 2.03  HEPARINUNFRC 0.23* 0.65 0.63  CREATININE 0.39* 0.37* 0.48   Estimated Creatinine Clearance: 72.4 mL/min (by C-G formula based on SCr of 0.48 mg/dL).  Assessment: 48 yoF on Coumadin 5mg  for Afib PTA. Heparin bridge given pt in Afib RVR and CHADsVAsc 7. INR therapeutic x1day at 2.03. Last heparin level supra threapeutic at 0.63. Hgb stable low at 8.3, plts wnl.  PO intake has increased in the last couple days to 70-100%.   No signs of bleeding noted.   Goal of Therapy:  INR 2-3 Heparin level 0.3-0.5 (lower goal) Monitor platelets by anticoagulation protocol: Yes   Plan:  Decrease heparin gtt to 1500 units/hr Will continue warfarin 7.5 mg x1 tonight Monitor daily heparin level / INR, CBC, s/s of bleed, and daily PO intake  Gwenlyn Found, [place] D PGY1  Pharmacy Resident  Phone [phone] 08/11/2018   8:15 AM

## 2018-08-11 NOTE — Progress Notes (Signed)
Occupational Therapy Session Note  Patient Details  Name: Melody Casey MRN: [id number] Date of Birth: 26-May-1946  Today's Date: 08/11/2018 OT Individual Time: 1110-1140 OT Individual Time Calculation (min): 30 min    Skilled Therapeutic Interventions/Progress Updates: Patient c/o of extreme fatigue and stated she needs breaks in between therapy and she stated did not have that earlier today.   This  Clinician assisted patient from w/c to bed transfer (Min A and extra time due to fatigue).    Also completed bed mobility with cues for straightening her body in the bed.    Husband standing by for support.   Patient call bell and phone within reach.     Therapy Documentation Precautions:  Precautions Precautions: Fall Precaution Comments: Colostomy  Restrictions Weight Bearing Restrictions: No General: General OT Amount of Missed Time: 30 Minutes(30) Pain:"sore from removing that surgical site" but I had tramadol   See Function Navigator for Current Functional Status.   Therapy/Group: Individual Therapy  Alfredia Ferguson [facility] 08/11/2018, 1:18 PM

## 2018-08-11 NOTE — Progress Notes (Signed)
Physical Therapy Session Note       (Late entry) Patient Details  Name: Melody Casey MRN: [id number] Date of Birth: [birth year]  Today's Date: 08/09/2018 PT Individual Time:1110-1205   55 min   Short Term Goals:  Week 1:  PT Short Term Goal 1 (Week 1): pt will perform functional transfers with max A PT Short Term Goal 2 (Week 1): pt will perform gait 10' in controlled environment with mod A  Skilled Therapeutic Interventions/Progress Updates:   Pt received sitting in recliner and agreeable to PT. Pt assisted RN to position Pt on bed pan sitting in recliner for urination. Mod assist to roll R and L to position pan and perform clothing management following bladder void. Sit>stand with stedy Max assist +2 from [place]. RN performed Ostomy care while pt sitting up in chair.   WC mobility x 160f with min assist to maintain straight path and moderate cues fo rimrpove use of the RUE to prevent hitting obstacles on the R.   Kinetron reciprocal endurance training and strengthening 5 x 45 sec with prolonged rest break following each. Cues for improved RON on the R.   [place] with 2 sec hold. Rest after each rep. Max cues for head hips relationship and pursed lip breathing . [facility] returned to [facility] and [facility] and all needs met.        Therapy Documentation Precautions:  Precautions Precautions: Fall Precaution Comments: Colostomy  Restrictions Weight Bearing Restrictions: No    Vital Signs: Therapy Vitals Temp: 98.4 F (36.9 C) Temp Source: Oral Pulse Rate: 89 Resp: 18 BP: (!) 144/86 Patient Position (if appropriate): Lying Oxygen Therapy SpO2: 97 % O2 Device: Room Air Pain: Pain Assessment Pain Scale: 0-10 Pain Score: 10-Worst pain ever(Wound Nurse present doing Wound Care) Faces Pain Scale: Hurts worst Pain Type: Surgical pain Pain Location: Abdomen Pain Orientation: Mid Pain Descriptors / Indicators: Aching;Discomfort Pain  Frequency: Intermittent Pain Onset: Gradual Patients Stated Pain Goal: 3 Pain Intervention(s): Medication (See eMAR);Environmental changes   See Function Navigator for Current Functional Status.   Therapy/Group: Individual Therapy  ALorie Phenix9/01/2018, 8:50 AM

## 2018-08-11 NOTE — Progress Notes (Signed)
Patient ID: Melody Casey, female   DOB: [birth year], 72 y.o.   MRN: [id number]       Subjective: Rough night last night due to bladder issues.  Complains of left leg swelling which has been chronic this hospitalization.    Objective: Vital signs in last 24 hours: Temp:  [98 F (36.7 C)-98.6 F (37 C)] 98.4 F (36.9 C) (09/02 0500) Pulse Rate:  [86-96] 89 (09/02 0500) Resp:  [16-18] 18 (09/02 0500) BP: (120-144)/(61-86) 144/86 (09/02 0500) SpO2:  [95 %-98 %] 97 % (09/02 0500) Last BM Date: 08/10/18  Intake/Output from previous day: 09/01 0701 - 09/02 0700 In: 1178.3 [P.O.:720; I.V.:187.9; IV Piggyback:270.4] Out: -  Intake/Output this shift: No intake/output data recorded.  PE: Abd: soft, ostomy is working well with good feculent output, midline wound base with some fibrin present.  Edges have good bleeding granulation tissue.     Lab Results:  Recent Labs    08/10/18 0619 08/11/18 0614  WBC 4.1 3.9*  HGB 8.5* 8.3*  HCT 26.6* 26.4*  PLT 294 278   BMET Recent Labs    08/10/18 0619 08/11/18 0614  NA 133* 132*  K 3.8 4.3  CL 106 106  CO2 24 22  GLUCOSE 88 88  BUN 6* 6*  CREATININE 0.37* 0.48  CALCIUM 8.5* 8.5*   PT/INR Recent Labs    08/10/18 0619 08/11/18 0614  LABPROT 20.6* 22.7*  INR 1.78 2.03   CMP     Component Value Date/Time   NA 132 (L) 08/11/2018 0614   NA 132 (L) 07/22/2018 1632   NA 134 (L) 06/06/2017 1259   K 4.3 08/11/2018 0614   K 4.5 06/06/2017 1259   CL 106 08/11/2018 0614   CO2 22 08/11/2018 0614   CO2 24 06/06/2017 1259   GLUCOSE 88 08/11/2018 0614   GLUCOSE 311 (H) 06/06/2017 1259   BUN 6 (L) 08/11/2018 0614   BUN 32 (H) 07/22/2018 1632   BUN 18.3 06/06/2017 1259   CREATININE 0.48 08/11/2018 0614   CREATININE 1.0 06/06/2017 1259   CALCIUM 8.5 (L) 08/11/2018 0614   CALCIUM 10.6 (H) 06/06/2017 1259   PROT 4.9 (L) 08/07/2018 0700   PROT 6.5 07/22/2018 1632   PROT 7.7 06/06/2017 1259   ALBUMIN 1.5 (L) 08/07/2018 0700   ALBUMIN 3.5 07/22/2018 1632   ALBUMIN 3.3 (L) 06/06/2017 1259   AST 20 08/07/2018 0700   AST 17 06/06/2017 1259   ALT 12 08/07/2018 0700   ALT 14 06/06/2017 1259   ALKPHOS 74 08/07/2018 0700   ALKPHOS 115 06/06/2017 1259   BILITOT 0.6 08/07/2018 0700   BILITOT 0.9 07/22/2018 1632   BILITOT 0.52 06/06/2017 1259   GFRNONAA >60 08/11/2018 0614   GFRNONAA 80 06/22/2015 1614   GFRAA >60 08/11/2018 0614   GFRAA >89 06/22/2015 1614   Lipase     Component Value Date/Time   LIPASE 24 07/24/2018 0944       Studies/Results: No results found.  Anti-infectives: Anti-infectives (From admission, onward)   Start     Dose/Rate Route Frequency Ordered Stop   08/06/18 2000  hydroxychloroquine (PLAQUENIL) tablet 200 mg     200 mg Oral 2 times daily 08/06/18 1651         Assessment/Plan Principal Problem: Sepsis ([place]) Active Problems: Type 2 diabetes mellitus with complication, without long-term current use of insulin (HCC) CAD (coronary artery disease) Anxiety and depression AAA (abdominal aortic aneurysm) without rupture (HCC) Microcytic anemia Diverticulosis Acute diverticulitis of intestine Bowel  perforation ([place]) Hyponatremia  Bowel perforation POD#17S/P Hartman's (left colectomy, end transverse colostomy), Dr. Donne Hazel, 08/16 -bowel function stable -soft diet, continueensure - VAC M/W/F with white foam in base - we will recheck wound on Wednesday with VAC changes - PT/OT, per CIR  LXB:WIOM, ensure VTE: SCD's, coumadin BT:DHRCB 08/15-08/21 Follow up:Dr. Donne Hazel   LOS: 5 days    Henreitta Cea , [facility] 08/11/2018, 8:09 AM Pager: [phone]

## 2018-08-12 ENCOUNTER — Inpatient Hospital Stay (HOSPITAL_COMMUNITY): Payer: Medicare Other | Admitting: Physical Therapy

## 2018-08-12 ENCOUNTER — Inpatient Hospital Stay (HOSPITAL_COMMUNITY): Payer: Medicare Other | Admitting: Occupational Therapy

## 2018-08-12 ENCOUNTER — Inpatient Hospital Stay (HOSPITAL_COMMUNITY): Payer: Medicare Other

## 2018-08-12 DIAGNOSIS — I10 Essential (primary) hypertension: Secondary | ICD-10-CM

## 2018-08-12 DIAGNOSIS — R600 Localized edema: Secondary | ICD-10-CM

## 2018-08-12 DIAGNOSIS — Z7901 Long term (current) use of anticoagulants: Secondary | ICD-10-CM

## 2018-08-12 LAB — CBC
HEMATOCRIT: 27.5 % — AB (ref 36.0–46.0)
Hemoglobin: 8.6 g/dL — ABNORMAL LOW (ref 12.0–15.0)
MCH: 27.3 pg (ref 26.0–34.0)
MCHC: 31.3 g/dL (ref 30.0–36.0)
MCV: 87.3 fL (ref 78.0–100.0)
Platelets: 323 10*3/uL (ref 150–400)
RBC: 3.15 MIL/uL — ABNORMAL LOW (ref 3.87–5.11)
RDW: 18.2 % — AB (ref 11.5–15.5)
WBC: 4.5 10*3/uL (ref 4.0–10.5)

## 2018-08-12 LAB — BASIC METABOLIC PANEL
Anion gap: 5 (ref 5–15)
BUN: 7 mg/dL — ABNORMAL LOW (ref 8–23)
CALCIUM: 8.8 mg/dL — AB (ref 8.9–10.3)
CO2: 22 mmol/L (ref 22–32)
CREATININE: 0.46 mg/dL (ref 0.44–1.00)
Chloride: 105 mmol/L (ref 98–111)
GFR calc non Af Amer: >60 mL/min (ref >60)
Glucose, Bld: 78 mg/dL (ref 70–99)
Potassium: 4.3 mmol/L (ref 3.5–5.1)
Sodium: 132 mmol/L — ABNORMAL LOW (ref 135–145)

## 2018-08-12 LAB — PROTIME-INR
INR: 2.48
Prothrombin Time: 26.6 seconds — ABNORMAL HIGH (ref 11.4–15.2)

## 2018-08-12 LAB — HEPARIN LEVEL (UNFRACTIONATED): HEPARIN UNFRACTIONATED: 0.47 [IU]/mL (ref 0.30–0.70)

## 2018-08-12 LAB — MAGNESIUM: Magnesium: 1.7 mg/dL (ref 1.7–2.4)

## 2018-08-12 MED ORDER — MAGNESIUM SULFATE 4 GM/100ML IV SOLN
4.0000 g | Freq: Once | INTRAVENOUS | Status: AC
  Administered 2018-08-12: 4 g via INTRAVENOUS
  Filled 2018-08-12: qty 100

## 2018-08-12 MED ORDER — WARFARIN SODIUM 5 MG PO TABS
5.0000 mg | ORAL_TABLET | Freq: Once | ORAL | Status: AC
  Administered 2018-08-12: 5 mg via ORAL
  Filled 2018-08-12: qty 1

## 2018-08-12 NOTE — Progress Notes (Signed)
Occupational Therapy Note  Patient Details  Name: Melody Casey MRN: [id number] Date of Birth: 03-08-1946  Today's Date: 08/12/2018 OT Missed Time: 49 Minutes Missed Time Reason: Patient fatigue;Pain  Pt missed 60 mins skilled OT services.  Pt asleep in bed upon arrival but easily aroused.  Pt declined therapy secondary to fatigue and increased abdominal pain following morning therapies.    Leotis Shames [facility] 08/12/2018, 1:34 PM

## 2018-08-12 NOTE — Progress Notes (Signed)
Physical Therapy Session Note  Patient Details  Name: Melody Casey MRN: [id number] Date of Birth: [birth year]  Today's Date: 08/12/2018 PT Individual Time: 1415-1430 PT Individual Time Calculation (min): 15 min   Short Term Goals: Week 1:  PT Short Term Goal 1 (Week 1): pt will perform functional transfers with max A PT Short Term Goal 2 (Week 1): pt will perform gait 10' in controlled environment with mod A  Skilled Therapeutic Interventions/Progress Updates:    Pt refused out of bed activity this session due to c/o fatigue and pain from dressing change.  PT educated pt on importance of out of bed and pt agreeable to sit up for dinner tonight.  RN made aware.  PT discussed set schedule with pt to help increase participation, pt agreeable to set therapy schedule.  Pt missed 30 minutes of skilled PT session.  Therapy Documentation Precautions:  Precautions Precautions: Fall Precaution Comments: Colostomy  Restrictions Weight Bearing Restrictions: No   Pain: Pt c/o pain in abdomen after dressing change with wound nurse.  RN aware and meds given prior to session.  Therapy/Group: Individual Therapy  DONAWERTH,KAREN 08/12/2018, 2:38 PM

## 2018-08-12 NOTE — Progress Notes (Signed)
Occupational Therapy Session Note  Patient Details  Name: Melody Casey MRN: [id number] Date of Birth: [birth year]  Today's Date: 08/12/2018 OT Individual Time: 5638-7564 OT Individual Time Calculation (min): 28 min    Short Term Goals: Week 1:  OT Short Term Goal 1 (Week 1): Pt will complete toilet transfer with mod A of 1  OT Short Term Goal 2 (Week 1): Pt will perform 1 step of ostomy care with min A OT Short Term Goal 3 (Week 1): Pt will complete 1 step of LB dressing task.  OT Short Term Goal 4 (Week 1): Pt will tolerate 1 minute standing in stedy in preparation for BADL task  Skilled Therapeutic Interventions/Progress Updates:    Pt completed sit to stand intervals from the wheelchair to start session.  She needed mod assist for sit to stand with standing interval of less than 1 minute X 2.  Not increased lean forward and to the left secondary to hip length discrepancy per pt.  On second attempt of standing pt reported needing to go to the bathroom, however she had already wet her brief.  Therapist brought out [facility] and she completed transfer to the toilet stand pivot with mod assist.  She was able to urinate some more in the toilet, but brief was already soaked.  Max assist for removal of all clothing and brief with max assist to donn new one as well as pants and gripper socks.  Finished session with sit to stand to pull items over hips and pivot transfer back to the bed.  During transfer pt pushed the walker to the side and reached down to the bed to support herself during the transfer, taking 2-3 small steps.  She also sat down before getting her hips squared up to the surface.  Pt did not listen to therapist direction about using the RW and staying inside of it.  Therapist reiterated the need to stay in the walker and not attempt furniture walking.  Pt voiced understanding but stated she was so fatigued sh just wanted to get in the bed. Min assist to transition to supine to finish session.  Call  button and phone in reach with bed alarm in place.    Therapy Documentation Precautions:  Precautions Precautions: Fall Precaution Comments: Colostomy  Restrictions Weight Bearing Restrictions: No   Pain: Pain Assessment Pain Scale: Faces Pain Score: 0-No pain Pain Type: Surgical pain Pain Location: Abdomen Pain Orientation: Mid;Left Pain Descriptors / Indicators: Aching Pain Frequency: Intermittent Pain Onset: On-going Patients Stated Pain Goal: 2 Pain Intervention(s): Medication (See eMAR) ADL: See Function Navigator for Current Functional Status.   Therapy/Group: Individual Therapy  Laylana Gerwig OTR/L 08/12/2018, 12:30 PM

## 2018-08-12 NOTE — Plan of Care (Signed)
  Problem: RH BOWEL ELIMINATION Goal: RH STG MANAGE BOWEL WITH ASSISTANCE Description STG Manage Bowel with Assistance. Mod assist to manage ostomy  Outcome: Progressing   Problem: RH BLADDER ELIMINATION Goal: RH STG MANAGE BLADDER WITH ASSISTANCE Description STG Manage Bladder With Assistance. Min assist  Outcome: Progressing   Problem: RH SKIN INTEGRITY Goal: RH STG MAINTAIN SKIN INTEGRITY WITH ASSISTANCE Description STG Maintain Skin Integrity With Assistance. Mod assist  Outcome: Progressing Goal: RH STG ABLE TO PERFORM INCISION/WOUND CARE W/ASSISTANCE Description STG Able To Perform Incision/Wound Care With Assistance. Mod assist  Outcome: Progressing   Problem: RH PAIN MANAGEMENT Goal: RH STG PAIN MANAGED AT OR BELOW PT'S PAIN GOAL Description Less than 4  Outcome: Progressing   Problem: Education: Goal: Knowledge of ostomy care will improve Outcome: Progressing Goal: Understanding of discharge needs will improve Outcome: Progressing   Problem: Bowel/Gastric/Urinary: Goal: Gastrointestinal status for postoperative course will improve Outcome: Progressing   Problem: Consults Goal: RH GENERAL PATIENT EDUCATION Description See Patient Education module for education specifics. Outcome: Progressing

## 2018-08-12 NOTE — Progress Notes (Signed)
Physical Therapy Session Note  Patient Details  Name: Melody Casey MRN: [id number] Date of Birth: [birth year]  Today's Date: 08/12/2018 PT Individual Time: 0835-0900 PT Individual Time Calculation (min): 25 min   Short Term Goals: Week 1:  PT Short Term Goal 1 (Week 1): pt will perform functional transfers with max A PT Short Term Goal 2 (Week 1): pt will perform gait 10' in controlled environment with mod A  Skilled Therapeutic Interventions/Progress Updates:   Pt in supine and agreeable to therapy, denies pain. Transferred to EOB w/ min assist. Total assist to don pants, socks, and shoes for time management. Sit<>stands in stedy w/ mod assist and transferred to w/c via stedy. Worked on endurance w/ w/c mobility and overall tolerance to upright/OOB activity. Self-propelled w/c 100' w/ increased time using BUEs and verbal cues for propulsion technique. Returned to room and ended session in w/c, call bell within reach and all needs met.   Therapy Documentation Precautions:  Precautions Precautions: Fall Precaution Comments: Colostomy  Restrictions Weight Bearing Restrictions: No Vital Signs: Therapy Vitals Pulse Rate: 94 Resp: 18 BP: 120/70 Patient Position (if appropriate): Lying Oxygen Therapy SpO2: 97 % O2 Device: Room Air Pain: Pain Assessment Pain Scale: 0-10 Pain Score: 4  Pain Type: Surgical pain Pain Location: Abdomen Pain Orientation: Mid;Left Pain Descriptors / Indicators: Aching Pain Frequency: Intermittent Pain Onset: On-going Patients Stated Pain Goal: 2 Pain Intervention(s): Medication (See eMAR)  See Function Navigator for Current Functional Status.   Therapy/Group: Individual Therapy  [name] 08/12/2018, 9:03 AM

## 2018-08-12 NOTE — Progress Notes (Signed)
Social Work Patient ID: [name], female   DOB: 25-Nov-1946, 72 y.o.   MRN: [id number]     Rexene Alberts  Social Worker  General Practice  Patient Care Conference  Addendum  Date of Service:  08/12/2018  3:36 PM            Show:Clear all [x] Manual[x] Template[] Copied  Added by: [x] Lowella Curb, LCSW  [] Hover for details Inpatient RehabilitationTeam Conference and Plan of Care Update Date: 08/12/2018   Time: 10:40 AM      Patient Name: Melody Casey      Medical Record Number: [id number]  Date of Birth: 13-Jun-1946 Sex: Female         Room/Bed: 4W12C/4W12C-01 Payor Info: Payor: MEDICARE / Plan: MEDICARE PART A AND B / Product Type: *No Product type* /     Admitting Diagnosis: Bowel perforation  Admit Date/Time:  08/06/2018  4:48 PM Admission Comments: No comment available    Primary Diagnosis:  <principal problem not specified> Principal Problem: <principal problem not specified>       Patient Active Problem List    Diagnosis Date Noted  . Chronic anticoagulation    . Hypokalemia    . Edema of left lower extremity    . Atrial fibrillation ([place])    . Debility 08/06/2018  . Acute blood loss anemia    . PAF (paroxysmal atrial fibrillation) ([place])    . Anxiety state    . Sepsis ([place]) 07/24/2018  . Acute diverticulitis of intestine 07/24/2018  . Bowel perforation ([place]) 07/24/2018  . Hyponatremia 07/24/2018  . Osteoporosis 08/27/2017  . Low vitamin D level 06/04/2017  . Frequent UTI 10/25/2015  . Microcytic anemia 08/30/2015  . Diverticulosis 08/30/2015  . Hemangioma    . TIA (transient ischemic attack) 08/13/2015  . Atrial fibrillation with rapid ventricular response ([place])    . Rheumatoid arthritis ([place]) 06/20/2015  . AAA (abdominal aortic aneurysm) without rupture ([place]) 01/28/2015  . Persistent atrial fibrillation ([place]) 11/17/2014  . History of scleroderma 02/17/2014  . Malignant neoplasm of upper-outer quadrant of right breast in female, estrogen receptor  positive ([place]) 02/01/2014  . Anxiety and depression 12/07/2011  . High risk medication use 06/14/2011  . CAD (coronary artery disease)    . Type 2 diabetes mellitus with complication, without long-term current use of insulin ([place]) 06/07/2008  . HLD (hyperlipidemia) 07/22/2007  . Essential hypertension 07/22/2007      Expected Discharge Date: Expected Discharge Date: 08/28/18   Team Members Present: Physician leading conference: Dr. Delice Lesch Social Worker Present: Lennart Pall, LCSW Nurse Present: Heather Roberts, RN PT Present: Roderic Ovens, PT OT Present: Other (comment) SLP Present: Weston Anna, SLP PPS Coordinator present : Daiva Nakayama, RN, CRRN       Current Status/Progress Goal Weekly Team Focus  Medical     Debility secondary to perforated sigmoid colon/recurrent diverticulitis status post left colectomy, end transverse colostomy 07/25/2018  Improve mobility, safety, wound, electrolytes, transition to oral anticoag  See above   Bowel/Bladder     Continent/incontinent with bladder, colostomt  To be able to have nomal bladder function, and to be able to perform colostomy care  Assess bowel/bladder function with mod assist   Swallow/Nutrition/ Hydration               ADL's     dresses UB with min A, only dresses socks and shoes with AE with A; delcines formal bathing and dressing a lot with therapy due to pain and fatigue;  transfers wtih [facility]  supervision   activity tolerance, out of bed, sit to stands, stands   Mobility     mod A transfers, max A gait with close w/c follow  supervision overall  endurance, strength, gait   Communication               Safety/Cognition/ Behavioral Observations             Pain     Conplains of ABD pain, Tramadol PRN given  <2  Assess and treat pain q shift and as needed   Skin     ABD incision with wound vac  Skin to be free from infection with min assist  Assess akin q shift and as needed     Rehab Goals Patient on target to meet  rehab goals: Yes *See Care Plan and progress notes for long and short-term goals.      Barriers to Discharge   Current Status/Progress Possible Resolutions Date Resolved   Physician     Medical stability;Other (comments)  +VAC  See above  Therapies, transition to oral anticoagulation, cont VAC, follow labs, optimize electrolytes      Nursing                 PT                    OT                 SLP            SW              Discharge Planning/Teaching Needs:  Pt to d/c home with spouse who can provide only supervision.  Teaching to be done with spouse closer to d/c and to include colostomy care as well.   Team Discussion:  Heparin ended today;  VAC - surgical PA to check ostomy site.  Therapists ask that staff encourage up to toilet and stop using bedpan.  Ambulated today with walker/ min assist ~ 5 ft but really her first day up!  Mod/max to stand.  Very limited participation with OT and very fatigued.  May need to change therapy schedule per tolerance.  Goals set for supervision and txs are hopeful she can meet this.  Tx recommend neuropsych consult.  Revisions to Treatment Plan:  Considering change in therapy schedule.    Continued Need for Acute Rehabilitation Level of Care: The patient requires daily medical management by a physician with specialized training in physical medicine and rehabilitation for the following conditions: Daily direction of a multidisciplinary physical rehabilitation program to ensure safe treatment while eliciting the highest outcome that is of practical value to the patient.: Yes Daily medical management of patient stability for increased activity during participation in an intensive rehabilitation regime.: Yes Daily analysis of laboratory values and/or radiology reports with any subsequent need for medication adjustment of medical intervention for : Post surgical problems;Other     I attest that I was present, lead the team conference, and concur  with the assessment and plan of the team. Delice Lesch, MD   Lennart Pall 08/12/2018, 3:38 PM         Revision History

## 2018-08-12 NOTE — Progress Notes (Addendum)
[facility] PHYSICAL MEDICINE & REHABILITATION     PROGRESS NOTE    Subjective/Complaints: Patient seen sitting up in bed this morning. She states she slept well overnight. She feels that her edema is decreasing.  ROS: Denies CP, SOB, N/V/D  Objective:  No results found. Recent Labs    08/11/18 0614 08/12/18 0558  WBC 3.9* 4.5  HGB 8.3* 8.6*  HCT 26.4* 27.5*  PLT 278 323   Recent Labs    08/11/18 0614 08/12/18 0558  NA 132* 132*  K 4.3 4.3  CL 106 105  GLUCOSE 88 78  BUN 6* 7*  CREATININE 0.48 0.46  CALCIUM 8.5* 8.8*   CBG (last 3)  Recent Labs    08/09/18 1639 08/09/18 2105 08/10/18 0634  GLUCAP 93 99 76    Wt Readings from Last 3 Encounters:  08/06/18 89.8 kg  08/04/18 75.3 kg  07/22/18 75.8 kg     Intake/Output Summary (Last 24 hours) at 08/12/2018 0842 Last data filed at 08/11/2018 1805 Gross per 24 hour  Intake 480 ml  Output -  Net 480 ml    Vital Signs: Blood pressure 120/70, pulse 94, temperature 97.7 F (36.5 C), temperature source Oral, resp. rate 18, height 5\' 8"  (1.727 m), weight 89.8 kg, SpO2 97 %. Physical Exam:  Constitutional: No distress . Vital signs reviewed. HENT: Normocephalic.  Atraumatic. Eyes: EOMI. No discharge. Cardiovascular: RRR. No JVD. Respiratory: CTA bilaterally. Normal effort. GI: BS +, nondistended. Ostomy in place. +VAC Musc: No edema or tenderness in extremities. Musculoskeletal:  Left lower extremity edema, improving Right foot atrophy with contracture Neurological: She is alert and oriented Motor: Bilateral upper: 4/5 proximal distal Right lower extremity: Hip flexion, knee extension 3+/5, ankle contracture, stable Left lower extremity: Hip flexion, knee extension 2+/5, ankle 2+/5, stable  Skin: Skin is warm and dry. + VAC Psychiatric: cooperative, in better spirits   Assessment/Plan: 1. Functional deficits secondary to debility after colectomy which require 3+ hours per day of interdisciplinary therapy  in a comprehensive inpatient rehab setting. Physiatrist is providing close team supervision and 24 hour management of active medical problems listed below. Physiatrist and rehab team continue to assess barriers to discharge/monitor patient progress toward functional and medical goals.  Function:  Bathing Bathing position Bathing activity did not occur: Refused Position: Wheelchair/chair at sink  Bathing parts Body parts bathed by patient: Right arm, Left arm, Chest, Right upper leg, Left upper leg Body parts bathed by helper: Front perineal area, Buttocks, Left lower leg, Right lower leg, Back  Bathing assist Assist Level: Touching or steadying assistance(Pt > 75%)      Upper Body Dressing/Undressing Upper body dressing Upper body dressing/undressing activity did not occur: Refused What is the patient wearing?: Pull over shirt/dress     Pull over shirt/dress - Perfomed by patient: Thread/unthread right sleeve, Put head through opening, Thread/unthread left sleeve Pull over shirt/dress - Perfomed by helper: Pull shirt over trunk        Upper body assist        Lower Body Dressing/Undressing Lower body dressing Lower body dressing/undressing activity did not occur: Refused What is the patient wearing?: Pants, Non-skid slipper socks       Pants- Performed by helper: Thread/unthread right pants leg, Thread/unthread left pants leg, Pull pants up/down   Non-skid slipper socks- Performed by helper: Don/doff left sock, Don/doff right sock                  Lower body assist Assist  for lower body dressing: Touching or steadying assistance (Pt > 75%)      Toileting Toileting   Toileting steps completed by patient: Adjust clothing prior to toileting, Adjust clothing after toileting Toileting steps completed by helper: Adjust clothing prior to toileting, Performs perineal hygiene, Adjust clothing after toileting Toileting Assistive Devices: Prosthesis/orthosis  Toileting assist  Assist level: Touching or steadying assistance (Pt.75%)   Transfers Chair/bed transfer   Chair/bed transfer method: Other Chair/bed transfer assist level: 2 helpers Chair/bed transfer assistive device: Mechanical lift Mechanical lift: Ecologist     Max distance: 5 Assist level: Moderate assist (Pt 50 - 74%)   Wheelchair   Type: Manual Max wheelchair distance: 153ft Assist Level: Touching or steadying assistance (Pt > 75%)  Cognition Comprehension Comprehension assist level: Follows complex conversation/direction with no assist  Expression Expression assist level: Expresses complex ideas: With no assist  Social Interaction Social Interaction assist level: Interacts appropriately with others - No medications needed.  Problem Solving Problem solving assist level: Solves complex problems: Recognizes & self-corrects  Memory/ Memory assist level: Complete Independence: No helper   Medical Problem List and Plan: 1.  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  DVT Prophylaxis/Anticoagulation: Heparin bridge to chronic Coumadin resumed for history of atrial fibrillation  INR therapeutic on 9/3, will consider DCing heparin ggt 3. Pain Management: Ultram as needed 4. Mood: Xanax 0.25 mg 3 times daily as needed  -Neuropsych consult is requested  -Continue to provide positive reinforcement 5. Neuropsych: This patient is capable of making decisions on her own behalf.  -team to provide ego support as needed 6. Skin/Wound Care:    -Vacuum therapy to midline wound 7. Fluids/Electrolytes/Nutrition: encourae PO 8.  Acute blood loss anemia.    hgb 8.6 on 9/3 9.  Atrial fibrillation.  Cardizem 360 mg daily, Tikosyn 250 mg twice daily.  Cardiac rate controlled 10.  Hypertension.  Norvasc 2.5 mg daily  Controlled and 9/3 11.  Rheumatoid arthritis.  Plaquenil 200 mg  twice daily 12.  Hyperlipidemia.  Lipitor 13.  Left lower extremity edema: Dopplers from last week were negative.  She has no pain no limb.    Encouraged increased protein intake as well as elevation of the leg when possible.     Cont with TED stockings,  ACE wrap if needed 14. Hyponatremia  Sodium 132 on 9/3  Cont to monitor 15. Hypokalemia  Potassium 4.3 on 9/3  Cont daily potassium to 40 mEq twice daily  Mag 1.8 on 9/2    LOS (Days) 6 A FACE TO FACE EVALUATION WAS PERFORMED  Melody Goodgame Lorie Phenix, MD 08/12/2018 8:42 AM

## 2018-08-12 NOTE — Progress Notes (Signed)
Nurse paged [place] nurse for consult regarding ostomy draining bright red blood and pt reports no bowel output since pouch change on 08/11/18.  [place] nurse recommended contacting surgery PA. Called pager [phone] for consult. PA stated will come by and see pt. Pt c/o no pain to ostomy. Will cont to monitor. Will notify if any futher changes.   Erie Noe, LPN 2:67 AM, 12/45/80

## 2018-08-12 NOTE — Progress Notes (Signed)
Physical Therapy Note  Patient Details  Name: Melody Casey MRN: [id number] Date of Birth: [birth year] Today's Date: 08/12/2018    Pt schedule changed to 15/7 due to pain, fatigue and decreased participation   Rorey Hodges 08/12/2018, 2:41 PM

## 2018-08-12 NOTE — Patient Care Conference (Addendum)
Inpatient RehabilitationTeam Conference and Plan of Care Update Date: 08/12/2018   Time: 10:40 AM    Patient Name: Melody Casey      Medical Record Number: [id number]  Date of Birth: May 08, 1946 Sex: Female         Room/Bed: 4W12C/4W12C-01 Payor Info: Payor: MEDICARE / Plan: MEDICARE PART A AND B / Product Type: *No Product type* /    Admitting Diagnosis: Bowel perforation  Admit Date/Time:  08/06/2018  4:48 PM Admission Comments: No comment available   Primary Diagnosis:  <principal problem not specified> Principal Problem: <principal problem not specified>  Patient Active Problem List   Diagnosis Date Noted  . Chronic anticoagulation   . Hypokalemia   . Edema of left lower extremity   . Atrial fibrillation ([place])   . Debility 08/06/2018  . Acute blood loss anemia   . PAF (paroxysmal atrial fibrillation) ([place])   . Anxiety state   . Sepsis ([place]) 07/24/2018  . Acute diverticulitis of intestine 07/24/2018  . Bowel perforation ([place]) 07/24/2018  . Hyponatremia 07/24/2018  . Osteoporosis 08/27/2017  . Low vitamin D level 06/04/2017  . Frequent UTI 10/25/2015  . Microcytic anemia 08/30/2015  . Diverticulosis 08/30/2015  . Hemangioma   . TIA (transient ischemic attack) 08/13/2015  . Atrial fibrillation with rapid ventricular response ([place])   . Rheumatoid arthritis ([place]) 06/20/2015  . AAA (abdominal aortic aneurysm) without rupture ([place]) 01/28/2015  . Persistent atrial fibrillation ([place]) 11/17/2014  . History of scleroderma 02/17/2014  . Malignant neoplasm of upper-outer quadrant of right breast in female, estrogen receptor positive ([place]) 02/01/2014  . Anxiety and depression 12/07/2011  . High risk medication use 06/14/2011  . CAD (coronary artery disease)   . Type 2 diabetes mellitus with complication, without long-term current use of insulin ([place]) 06/07/2008  . HLD (hyperlipidemia) 07/22/2007  . Essential hypertension 07/22/2007    Expected Discharge Date: Expected Discharge  Date: 08/28/18  Team Members Present: Physician leading conference: Dr. Delice Lesch Social Worker Present: Lennart Pall, LCSW Nurse Present: Heather Roberts, RN PT Present: Roderic Ovens, PT OT Present: Other (comment) SLP Present: Weston Anna, SLP PPS Coordinator present : Daiva Nakayama, RN, CRRN     Current Status/Progress Goal Weekly Team Focus  Medical   Debility secondary to perforated sigmoid colon/recurrent diverticulitis status post left colectomy, end transverse colostomy 07/25/2018  Improve mobility, safety, wound, electrolytes, transition to oral anticoag  See above   Bowel/Bladder   Continent/incontinent with bladder, colostomt  To be able to have nomal bladder function, and to be able to perform colostomy care  Assess bowel/bladder function with mod assist   Swallow/Nutrition/ Hydration             ADL's   dresses UB with min A, only dresses socks and shoes with AE with A; delcines formal bathing and dressing a lot with therapy due to pain and fatigue; transfers wtih STEDY  supervision   activity tolerance, out of bed, sit to stands, stands   Mobility   mod A transfers, max A gait with close w/c follow  supervision overall  endurance, strength, gait   Communication             Safety/Cognition/ Behavioral Observations            Pain   Conplains of ABD pain, Tramadol PRN given  <2  Assess and treat pain q shift and as needed   Skin   ABD incision with wound vac  Skin to be free from  infection with min assist  Assess akin q shift and as needed    Rehab Goals Patient on target to meet rehab goals: Yes *See Care Plan and progress notes for long and short-term goals.     Barriers to Discharge  Current Status/Progress Possible Resolutions Date Resolved   Physician    Medical stability;Other (comments)  +VAC  See above  Therapies, transition to oral anticoagulation, cont VAC, follow labs, optimize electrolytes      Nursing                  PT                     OT                  SLP                SW                Discharge Planning/Teaching Needs:  Pt to d/c home with spouse who can provide only supervision.  Teaching to be done with spouse closer to d/c and to include colostomy care as well.   Team Discussion:  Heparin ended today;  VAC - surgical PA to check ostomy site.  Therapists ask that staff encourage up to toilet and stop using bedpan.  Ambulated today with walker/ min assist ~ 5 ft but really her first day up!  Mod/max to stand.  Very limited participation with OT and very fatigued.  May need to change therapy schedule per tolerance.  Goals set for supervision and txs are hopeful she can meet this.  Tx recommend neuropsych consult.  Revisions to Treatment Plan:  Considering change in therapy schedule.    Continued Need for Acute Rehabilitation Level of Care: The patient requires daily medical management by a physician with specialized training in physical medicine and rehabilitation for the following conditions: Daily direction of a multidisciplinary physical rehabilitation program to ensure safe treatment while eliciting the highest outcome that is of practical value to the patient.: Yes Daily medical management of patient stability for increased activity during participation in an intensive rehabilitation regime.: Yes Daily analysis of laboratory values and/or radiology reports with any subsequent need for medication adjustment of medical intervention for : Post surgical problems;Other   I attest that I was present, lead the team conference, and concur with the assessment and plan of the team. Delice Lesch, MD  Donata Clay, [place] 08/12/2018, 3:38 PM

## 2018-08-12 NOTE — Progress Notes (Signed)
[facility] [facility] [place] for Heparin >> warfarin Indication: atrial fibrillation  Allergies  Allergen Reactions  . Miralax [Polyethylene Glycol] Swelling and Rash    Took [facility] brand developed rash. Patient states she tolerated name brand MiraLax in the past.  09/01/15. Patient states she had diffuse swelling including swelling of her lips.   . Vancomycin Rash and Shortness Of Breath  . Acetaminophen Hives  . Oxycodone-Acetaminophen Itching  . Sulfa Antibiotics Rash    All over rash  . Sulfacetamide Sodium Rash    All over rash  . Sulfasalazine Rash    All over rash All over rash  . Banana Other (See Comments)    Unknown  . Latex Rash  . Tape Rash   Patient Measurements: Height: 5\' 8"  (172.7 cm) Weight: 197 lb 15.6 oz (89.8 kg) IBW/kg (Calculated) : 63.9 Heparin Dosing Weight: 76 kg  Vital Signs: Temp: 97.7 F (36.5 C) (09/03 0501) Temp Source: Oral (09/03 0501) BP: 120/70 (09/03 0829) Pulse Rate: 94 (09/03 0829)  Labs: Recent Labs    08/10/18 0619 08/11/18 0614 08/12/18 0558  HGB 8.5* 8.3* 8.6*  HCT 26.6* 26.4* 27.5*  PLT 294 278 323  LABPROT 20.6* 22.7* 26.6*  INR 1.78 2.03 2.48  HEPARINUNFRC 0.65 0.63 0.47  CREATININE 0.37* 0.48 0.46   Estimated Creatinine Clearance: 72.4 mL/min (by C-G formula based on SCr of 0.46 mg/dL).  Assessment: 48 yoF on Coumadin 5mg  for Afib PTA. Heparin bridge given pt in Afib RVR and CHADsVAsc 7. INR therapeutic x2 day at 2.48. Last heparin level supra threapeutic at 0.47. Hgb stable low at 8.3, plts wnl. We will dc heparin today. Her mg is still low and since she is on Tikosyn, it needs to be above 2. Ok to give another bolus per Dr. Posey Pronto. We will dc heparin today.   PO intake has increased in the last couple days to 70-100%.   No signs of bleeding noted.   Goal of Therapy:  INR 2-3 Heparin level 0.3-0.5 (lower goal) Monitor platelets by anticoagulation protocol: Yes   Plan:  Dc heparin Warfarin  5 mg x1 tonight Magnesium 4g IV x1 Monitor daily heparin level / INR, CBC, s/s of bleed, and daily PO intake Mg level in AM  Onnie Boer, PharmD, [place], AAHIVP, CPP Infectious Disease Pharmacist Pager: [phone] 08/12/2018 9:21 AM

## 2018-08-12 NOTE — Progress Notes (Signed)
Physical Therapy Session Note  Patient Details  Name: Melody Casey MRN: [id number] Date of Birth: [birth year]  Today's Date: 08/12/2018 PT Individual Time: 0930-1023 PT Individual Time Calculation (min): 53 min   Short Term Goals: Week 1:  PT Short Term Goal 1 (Week 1): pt will perform functional transfers with max A PT Short Term Goal 2 (Week 1): pt will perform gait 10' in controlled environment with mod A  Skilled Therapeutic Interventions/Progress Updates:    pt in w/c agreeable to therapy. Pt performs squat pivot transfer to mat with mod A, cues for wt shifting and trunk and hips.  Sit to stand from elevated surface with mod A with RW.  Pt able to perform gait 3 x 5' with RW with min A for balance, close w/c follow due to decreased endurance.  Pt able to perform sit <> stand from w/c multiple repetitions with mod/max A with cues for UE placement.  Pt performs stand pivot transfer with RW with max A, +2 to for safety and equipment.  Nustep x 6 minutes level 4 for UE/LE strength and endurance.  Pt left in room with needs at hand.  Therapy Documentation Precautions:  Precautions Precautions: Fall Precaution Comments: Colostomy  Restrictions Weight Bearing Restrictions: No Pain: Pt c/o abdominal pain with initial transfers, eased with rest.    Therapy/Group: Individual Therapy  DONAWERTH,KAREN 08/12/2018, 10:23 AM

## 2018-08-12 NOTE — Progress Notes (Signed)
[facility] Progress Note     Subjective: CC:  Pain controlled. Cc is feeling tired.   Afebrile, VSS Objective: Vital signs in last 24 hours: Temp:  [97.7 F (36.5 C)-98.4 F (36.9 C)] 97.7 F (36.5 C) (09/03 0501) Pulse Rate:  [71-94] 94 (09/03 0829) Resp:  [16-18] 18 (09/03 0829) BP: (113-137)/(60-74) 120/70 (09/03 0829) SpO2:  [94 %-97 %] 97 % (09/03 0829) Last BM Date: 08/11/18  Intake/Output from previous day: 09/02 0701 - 09/03 0700 In: 720 [P.O.:720] Out: -  Intake/Output this shift: No intake/output data recorded.  PE: Gen:  Alert, NAD, pleasant and cooperative  Pulm:  Normal effort,  Abd: Soft, non-tender, midline wound with increased granulation tissue, LLQ colostomy without active bleeding, digitized stoma - it is patent with some palpable hard stool.     Skin: warm and dry, no rashes  Psych: A&Ox3   Lab Results:  Recent Labs    08/11/18 0614 08/12/18 0558  WBC 3.9* 4.5  HGB 8.3* 8.6*  HCT 26.4* 27.5*  PLT 278 323   BMET Recent Labs    08/11/18 0614 08/12/18 0558  NA 132* 132*  K 4.3 4.3  CL 106 105  CO2 22 22  GLUCOSE 88 78  BUN 6* 7*  CREATININE 0.48 0.46  CALCIUM 8.5* 8.8*   PT/INR Recent Labs    08/11/18 0614 08/12/18 0558  LABPROT 22.7* 26.6*  INR 2.03 2.48   CMP     Component Value Date/Time   NA 132 (L) 08/12/2018 0558   NA 132 (L) 07/22/2018 1632   NA 134 (L) 06/06/2017 1259   K 4.3 08/12/2018 0558   K 4.5 06/06/2017 1259   CL 105 08/12/2018 0558   CO2 22 08/12/2018 0558   CO2 24 06/06/2017 1259   GLUCOSE 78 08/12/2018 0558   GLUCOSE 311 (H) 06/06/2017 1259   BUN 7 (L) 08/12/2018 0558   BUN 32 (H) 07/22/2018 1632   BUN 18.3 06/06/2017 1259   CREATININE 0.46 08/12/2018 0558   CREATININE 1.0 06/06/2017 1259   CALCIUM 8.8 (L) 08/12/2018 0558   CALCIUM 10.6 (H) 06/06/2017 1259   PROT 4.9 (L) 08/07/2018 0700   PROT 6.5 07/22/2018 1632   PROT 7.7 06/06/2017 1259   ALBUMIN 1.5 (L) 08/07/2018 0700   ALBUMIN 3.5 07/22/2018 1632   ALBUMIN 3.3 (L) 06/06/2017 1259   AST 20 08/07/2018 0700   AST 17 06/06/2017 1259   ALT 12 08/07/2018 0700   ALT 14 06/06/2017 1259   ALKPHOS 74 08/07/2018 0700   ALKPHOS 115 06/06/2017 1259   BILITOT 0.6 08/07/2018 0700   BILITOT 0.9 07/22/2018 1632   BILITOT 0.52 06/06/2017 1259   GFRNONAA >60 08/12/2018 0558   GFRNONAA 80 06/22/2015 1614   GFRAA >60 08/12/2018 0558   GFRAA >89 06/22/2015 1614   Lipase     Component Value Date/Time   LIPASE 24 07/24/2018 0944   Anti-infectives: Anti-infectives (From admission, onward)   Start     Dose/Rate Route Frequency Ordered Stop   08/06/18 2000  hydroxychloroquine (PLAQUENIL) tablet 200 mg     200 mg Oral 2 times daily 08/06/18 1651       Assessment/Plan Principal Problem: Sepsis ([place]) Active Problems: Type 2 diabetes mellitus with complication, without long-term current use of insulin (HCC) CAD (coronary artery disease) Anxiety and depression AAA (abdominal aortic aneurysm) without rupture (H[facility]) Microcytic anemia Diverticulosis Acute diverticulitis of intestine Bowel perforation ([facility]) Hyponatremia  Bowel perforation POD#18S/P Hartman's (left colectomy, end transverse colostomy),  Dr. Donne Hazel, 08/16 -called due to concern about blood in colostomy pouch; last stool noted in pouch was yesterday, CBC w/ chronic anemia and is stable - start BID prune juice for constipation due to polyethylene glycol allergy -soft diet, continueensure  -VAC M/W/F; changed again today due to proximity to stoma, will check wound again on Friday with WOC RN. - PT/OT, per CIR   XGX:IVHS, ensure, prune juice  VTE: SCD's, coumadin JW:TGRMB 08/15-08/21 Follow up:Dr. Donne Hazel   LOS: 6 days    Obie Dredge, [facility] Pager: [phone]

## 2018-08-12 NOTE — Consult Note (Signed)
[place] Nurse wound consult note Reason for Consult:CCS PA Theron Arista in to see patient and explore colostomy for impaction. NPWT dressing taken down due to close proximity to wound. Plan for change in NWPT change frequency this week to M/T/Friday, then resume M/W/F next week to avoid three days of consecutive changes. Wound type:Surgical Pressure Injury POA: NA Measurement: 19cm x 5.5cm x 8cm with 3 small openings at the proximal wound (above umbilicus). Deepest depth is at most distal portion of wound. Wound EXM:DYJWL red (80% with 20% yellow slough at most distal portion of wound. Friable, bleeds easily at wound edges. Drainage (amount, consistency, odor) See above Periwound:Intact Dressing procedure/placement/frequency: Dressing removed using medical adhesiver releaser spray and wound cleansed with NS, patted gently dry. Four (4) pieces of white foam removed along with 1 piece black foam. Periwound skin dressed with drape. Four pieces of white foam used to fill defects in proximal wound and to line the wound bed at the distal portion. 1 piece of black foam used to fill cavity. Draped applied and attached to negative pressure, 111mmHg continuous and an immediate seal is achieved. Cannister changed and tubing labled with # and type of foam dressings used. Patient tolerated procedure well.    [place] Nurse ostomy follow up: POD 18:  S/P Hartman's (left colectomy, end transverse colostomy), Dr. Donne Hazel, 07/25/18 Stoma type/location: LUQ end transverse colostomy Stomal assessment/size: Edematous, pale pink, dry, budded stoma. 2 inches round Peristomal assessment: intact, clear Treatment options for stomal/peristomal skin: Once skin barrier to encircle stoma, 1/2 skin barrier ring from 8-10 o'clock Output: dried stool on stoma, none in pouch. Ostomy pouching: 1pc.flat ostomy pouch with rings as noted above  Education provided: None today. Patient has allergies to stool softening agents, so will take prune  juice twice daily for today and tomorrow to relieve constipation.  CCS will follow stool output. Enrolled patient in [place] Discharge program: Yes, previously.  [place] nursing team will follow along for NPWT and ostomy care, and will remain available to this patient, the nursing, surgical and medical teams.  Note:  Pouch and NPWT dressing change will be on Friday of this week at 8am, 08/15/18. Thanks, Maudie Flakes, MSN, RN, [place], Arther Abbott  Pager# [phone]

## 2018-08-13 ENCOUNTER — Inpatient Hospital Stay (HOSPITAL_COMMUNITY): Payer: Medicare Other

## 2018-08-13 ENCOUNTER — Encounter (HOSPITAL_COMMUNITY): Payer: Medicare Other | Admitting: Psychology

## 2018-08-13 DIAGNOSIS — I10 Essential (primary) hypertension: Secondary | ICD-10-CM

## 2018-08-13 LAB — CBC
HEMATOCRIT: 28.1 % — AB (ref 36.0–46.0)
Hemoglobin: 8.7 g/dL — ABNORMAL LOW (ref 12.0–15.0)
MCH: 27 pg (ref 26.0–34.0)
MCHC: 31 g/dL (ref 30.0–36.0)
MCV: 87.3 fL (ref 78.0–100.0)
Platelets: 328 10*3/uL (ref 150–400)
RBC: 3.22 MIL/uL — ABNORMAL LOW (ref 3.87–5.11)
RDW: 18 % — AB (ref 11.5–15.5)
WBC: 4.3 10*3/uL (ref 4.0–10.5)

## 2018-08-13 LAB — PROTIME-INR
INR: 2.74
Prothrombin Time: 28.8 seconds — ABNORMAL HIGH (ref 11.4–15.2)

## 2018-08-13 LAB — BASIC METABOLIC PANEL
Anion gap: 4 — ABNORMAL LOW (ref 5–15)
BUN: 6 mg/dL — AB (ref 8–23)
CALCIUM: 9.3 mg/dL (ref 8.9–10.3)
CO2: 23 mmol/L (ref 22–32)
CREATININE: 0.52 mg/dL (ref 0.44–1.00)
Chloride: 104 mmol/L (ref 98–111)
GFR calc non Af Amer: >60 mL/min (ref >60)
GLUCOSE: 88 mg/dL (ref 70–99)
Potassium: 4.3 mmol/L (ref 3.5–5.1)
Sodium: 131 mmol/L — ABNORMAL LOW (ref 135–145)

## 2018-08-13 LAB — MAGNESIUM: Magnesium: 1.9 mg/dL (ref 1.7–2.4)

## 2018-08-13 MED ORDER — WARFARIN SODIUM 5 MG PO TABS
5.0000 mg | ORAL_TABLET | Freq: Once | ORAL | Status: AC
  Administered 2018-08-13: 5 mg via ORAL
  Filled 2018-08-13: qty 1

## 2018-08-13 NOTE — Progress Notes (Signed)
[place] for Heparin >> warfarin Indication: atrial fibrillation  Allergies  Allergen Reactions  . Miralax [Polyethylene Glycol] Swelling and Rash    Took [facility] brand developed rash. Patient states she tolerated name brand MiraLax in the past.  09/01/15. Patient states she had diffuse swelling including swelling of her lips.   . Vancomycin Rash and Shortness Of Breath  . Acetaminophen Hives  . Oxycodone-Acetaminophen Itching  . Sulfa Antibiotics Rash    All over rash  . Sulfacetamide Sodium Rash    All over rash  . Sulfasalazine Rash    All over rash All over rash  . Banana Other (See Comments)    Unknown  . Latex Rash  . Tape Rash   Patient Measurements: Height: 5\' 8"  (172.7 cm) Weight: 197 lb 15.6 oz (89.8 kg) IBW/kg (Calculated) : 63.9 Heparin Dosing Weight: 76 kg  Vital Signs: Temp: 97.8 F (36.6 C) (09/04 0400) Temp Source: Oral (09/04 0400) BP: 119/63 (09/04 0855) Pulse Rate: 94 (09/04 0855)  Labs: Recent Labs    08/11/18 0614 08/12/18 0558 08/13/18 0540  HGB 8.3* 8.6* 8.7*  HCT 26.4* 27.5* 28.1*  PLT 278 323 328  LABPROT 22.7* 26.6* 28.8*  INR 2.03 2.48 2.74  HEPARINUNFRC 0.63 0.47  --   CREATININE 0.48 0.46 0.52   Estimated Creatinine Clearance: 72.4 mL/min (by C-G formula based on SCr of 0.52 mg/dL).  Assessment: 81 yoF on Coumadin 5mg  for Afib PTA. Heparin bridge given pt in Afib RVR and CHADsVAsc 7. INR therapeutic today at 2.74. Last heparin level supra threapeutic at 0.47. Hgb stable low at 8.3, plts wnl. Heparin has been transition to coumadin. PO intake has increased in the last couple days to 70-100%. K+ remains above 4. Mg still slightly below goal of 2. Will f/u in AM to see if further replacement needed.   No signs of bleeding noted.   Goal of Therapy:  INR 2-3 Heparin level 0.3-0.5 (lower goal) Monitor platelets by anticoagulation protocol: Yes   Plan:   Warfarin 5 mg x1 tonight Monitor daily  heparin level / INR, CBC, s/s of bleed, and daily PO intake Mg level in AM  Onnie Boer, PharmD, [place], AAHIVP, CPP Infectious Disease Pharmacist Pager: [phone] 08/13/2018 9:31 AM

## 2018-08-13 NOTE — Plan of Care (Signed)
  Problem: RH BOWEL ELIMINATION Goal: RH STG MANAGE BOWEL WITH ASSISTANCE Description STG Manage Bowel with Assistance. Mod assist to manage ostomy  Outcome: Progressing   Problem: RH BLADDER ELIMINATION Goal: RH STG MANAGE BLADDER WITH ASSISTANCE Description STG Manage Bladder With Assistance. Min assist  Outcome: Progressing   Problem: RH SKIN INTEGRITY Goal: RH STG MAINTAIN SKIN INTEGRITY WITH ASSISTANCE Description STG Maintain Skin Integrity With Assistance. Mod assist  Outcome: Progressing Goal: RH STG ABLE TO PERFORM INCISION/WOUND CARE W/ASSISTANCE Description STG Able To Perform Incision/Wound Care With Assistance. Mod assist  Outcome: Progressing   Problem: RH PAIN MANAGEMENT Goal: RH STG PAIN MANAGED AT OR BELOW PT'S PAIN GOAL Description Less than 4  Outcome: Progressing   Problem: Consults Goal: RH GENERAL PATIENT EDUCATION Description See Patient Education module for education specifics. Outcome: Progressing   Problem: Education: Goal: Knowledge of ostomy care will improve Outcome: Progressing   Problem: Education: Goal: Understanding of discharge needs will improve Outcome: Progressing   Problem: Bowel/Gastric/Urinary: Goal: Gastrointestinal status for postoperative course will improve Outcome: Progressing

## 2018-08-13 NOTE — Progress Notes (Signed)
Occupational Therapy Session Note  Patient Details  Name: Melody Casey MRN: [id number] Date of Birth: 28-Jun-1946  Today's Date: 08/13/2018 OT Individual Time: 3500-9381 Session 2: 8299-3716 OT Individual Time Calculation (min): 71 min Session 2: 46 min   Short Term Goals: Week 1:  OT Short Term Goal 1 (Week 1): Pt will complete toilet transfer with mod A of 1  OT Short Term Goal 2 (Week 1): Pt will perform 1 step of ostomy care with min A OT Short Term Goal 3 (Week 1): Pt will complete 1 step of LB dressing task.  OT Short Term Goal 4 (Week 1): Pt will tolerate 1 minute standing in stedy in preparation for BADL task  Skilled Therapeutic Interventions/Progress Updates:    Pt supine in bed on bedpan, agreeable to therapy. Pt c/o abdominal pain throughout session as described below. Pt rolled R in bed with min A for removal of bed pan. Pt transitioned to EOB from supine with vc for log rolling to reduce pressure through abdomen and heavy use of bed features with min A. Pt required min A to maintain static/dynamic sitting balance EOB. Max A required to thread pants through B LE. Pt initially chose a pair of pants too tight so she doffed and donned a second pair of pants with improvement noted in standing level clothing management the second time, requiring only min A static balance support to pull up pants once at thighs, bimanually. Pt completed 20 ft of w/c propulsion with min A and heavy vc for technique before c/o fatigue. Pt completed 1 set of w/c push ups to increase B UE strength needed during ADL transfers with mod-min A. Pt was returned to room and left supine in bed with all needs met .  Session 2:  Pt supine in bed c/o pain in her lower abdomen at the surgical site but agreeable to therapy. Pt stood from EOB with mod A and completed stand pivot to [facility] with min A. Pt able to complete standing level peri hygiene and clothing management with min A. Pt stood for 1 min with B UE support on RW  during dressing change on sacrum. Pt took 5 steps with heavy reliance on B UE on RW and min A. Pt was taken outside for therapeutic rapport building. Pt returned to room and was left sitting up in w/c with all needs met.   Therapy Documentation Precautions:  Precautions Precautions: Fall Precaution Comments: Colostomy  Restrictions Weight Bearing Restrictions: No    Vital Signs: Therapy Vitals Pulse Rate: 94 BP: 119/63 Patient Position (if appropriate): Lying Pain: Pain Assessment Pain Scale: Faces Faces Pain Scale: Hurts little more Pain Type: Surgical pain Pain Location: Abdomen Pain Orientation: Left;Lower Pain Descriptors / Indicators: Aching Pain Onset: On-going Pain Intervention(s): Repositioned;Emotional support ADL: ADL ADL Comments: Please see functional navigator  See Function Navigator for Current Functional Status.   Therapy/Group: Individual Therapy  Curtis Sites 08/13/2018, 12:37 PM

## 2018-08-13 NOTE — Progress Notes (Signed)
[facility]ILIT[facility]     PROGRESS NOTE    Subjective/Complaints: Pt seen lying in bed this AM.  She slept well overnight.  She denies complaints.    ROS: Denies CP, SOB, N/V/D  Objective:  No results found. Recent Labs    08/12/18 0558 08/13/18 0540  WBC 4.5 4.3  HGB 8.6* 8.7*  HCT 27.5* 28.1*  PLT 323 328   Recent Labs    08/12/18 0558 08/13/18 0540  NA 132* 131*  K 4.3 4.3  CL 105 104  GLUCOSE 78 88  BUN 7* 6*  CREATININE 0.46 0.52  CALCIUM 8.8* 9.3   CBG (last 3)  No results for input(s): GLUCAP in the last 72 hours.  Wt Readings from Last 3 Encounters:  08/06/18 89.8 kg  08/04/18 75.3 kg  07/22/18 75.8 kg     Intake/Output Summary (Last 24 hours) at 08/13/2018 1248 Last data filed at 08/13/2018 0900 Gross per 24 hour  Intake 120 ml  Output 150 ml  Net -30 ml    Vital Signs: Blood pressure 119/63, pulse 94, temperature 97.8 F (36.6 C), temperature source Oral, resp. rate 18, height 5\' 8"  (1.727 m), weight 89.8 kg, SpO2 98 %. Physical Exam:  Constitutional: No distress . Vital signs reviewed. HENT: Normocephalic.  Atraumatic. Eyes: EOMI. No discharge. Cardiovascular: RRR. No JVD. Respiratory: CTA bilaterally. Normal effort. GI: BS +, nondistended. Ostomy in place. +VAC Musc: No edema or tenderness in extremities. Musculoskeletal:  Left lower extremity edema, improving Right foot atrophy with contracture Neurological: She is alert and oriented Motor: Bilateral upper: 4/5 proximal distal Right lower extremity: Hip flexion, knee extension 3+/5, ankle contracture, improving Left lower extremity: Hip flexion, knee extension 2+/5, ankle 2+/5, improving  Skin: Skin is warm and dry. + VAC Psychiatric: cooperative, in better spirits   Assessment/Plan: 1. Functional deficits secondary to debility after colectomy which require 3+ hours per day of interdisciplinary therapy in a comprehensive inpatient rehab setting. Physiatrist  is providing close team supervision and 24 hour management of active medical problems listed below. Physiatrist and rehab team continue to assess barriers to discharge/monitor patient progress toward functional and medical goals.  Function:  Bathing Bathing position Bathing activity did not occur: Refused Position: Wheelchair/chair at sink  Bathing parts Body parts bathed by patient: Right arm, Left arm, Chest, Right upper leg, Left upper leg Body parts bathed by helper: Front perineal area, Buttocks, Left lower leg, Right lower leg, Back  Bathing assist Assist Level: (moderate assist)      Upper Body Dressing/Undressing Upper body dressing Upper body dressing/undressing activity did not occur: Refused What is the patient wearing?: Pull over shirt/dress     Pull over shirt/dress - Perfomed by patient: Thread/unthread right sleeve, Put head through opening, Thread/unthread left sleeve Pull over shirt/dress - Perfomed by helper: Pull shirt over trunk        Upper body assist        Lower Body Dressing/Undressing Lower body dressing Lower body dressing/undressing activity did not occur: Refused What is the patient wearing?: Non-skid slipper socks, Pants       Pants- Performed by helper: Thread/unthread right pants leg, Thread/unthread left pants leg, Pull pants up/down   Non-skid slipper socks- Performed by helper: Don/doff left sock, Don/doff right sock                  Lower body assist Assist for lower body dressing: Touching or steadying assistance (Pt > 75%)  Toileting Toileting   Toileting steps completed by patient: Adjust clothing prior to toileting, Adjust clothing after toileting Toileting steps completed by helper: Adjust clothing prior to toileting, Performs perineal hygiene, Adjust clothing after toileting Toileting Assistive Devices: Prosthesis/orthosis  Toileting assist Assist level: Touching or steadying assistance (Pt.75%)   Transfers Chair/bed  transfer   Chair/bed transfer method: Other Chair/bed transfer assist level: 2 helpers Chair/bed transfer assistive device: Mechanical lift Mechanical lift: Ecologist     Max distance: 5 Assist level: Moderate assist (Pt 50 - 74%)   Wheelchair   Type: Manual Max wheelchair distance: 100' Assist Level: Supervision or verbal cues  Cognition Comprehension Comprehension assist level: Follows complex conversation/direction with no assist  Expression Expression assist level: Expresses complex ideas: With no assist  Social Interaction Social Interaction assist level: Interacts appropriately 90% of the time - Needs monitoring or encouragement for participation or interaction.  Problem Solving Problem solving assist level: Solves basic problems with no assist  Memory/ Memory assist level: Recognizes or recalls 90% of the time/requires cueing < 10% of the time   Medical Problem List and Plan: 1.  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  DVT Prophylaxis/Anticoagulation: Heparin bridged to chronic Coumadin resumed for history of atrial fibrillation  INR therapeutic on 9/4 3. Pain Management: Ultram as needed 4. Mood: Xanax 0.25 mg 3 times daily as needed  -Neuropsych consult is requested  -Continue to provide positive reinforcement 5. Neuropsych: This patient is capable of making decisions on her own behalf.  -team to provide ego support as needed 6. Skin/Wound Care:    -Vacuum therapy to midline wound 7. Fluids/Electrolytes/Nutrition: encourae PO 8.  Acute blood loss anemia.    hgb 8.7 on 9/4 9.  Atrial fibrillation.  Cardizem 360 mg daily, Tikosyn 250 mg twice daily.  Cardiac rate controlled 10.  Hypertension.  Norvasc 2.5 mg daily  Controlled on 9/4 11.  Rheumatoid arthritis.  Plaquenil 200 mg twice daily 12.  Hyperlipidemia.  Lipitor 13.  Left lower  extremity edema: Dopplers from last week were negative.  She has no pain no limb.    Encouraged increased protein intake as well as elevation of the leg when possible.     Cont with TED stockings,  ACE wrap if needed 14. Hyponatremia  Sodium 131 on 9/4  Cont to monitor 15. Hypokalemia  Potassium 4.3 on 9/4  Cont daily potassium to 40 mEq twice daily  Mag 1.8 on 9/2    LOS (Days) 7 A FACE TO FACE EVALUATION WAS PERFORMED  Namrata Dangler Lorie Phenix, MD 08/13/2018 12:48 PM

## 2018-08-13 NOTE — Progress Notes (Signed)
Physical Therapy Session Note  Patient Details  Name: Melody Casey MRN: [id number] Date of Birth: 14-Nov-1946  Today's Date: 08/13/2018 PT Individual Time: 1500-1600 PT Individual Time Calculation (min): 60 min   Short Term Goals: Week 1:  PT Short Term Goal 1 (Week 1): pt will perform functional transfers with max A PT Short Term Goal 2 (Week 1): pt will perform gait 10' in controlled environment with mod A     Skilled Therapeutic Interventions/Progress Updates:   W/c propulsion using bil UEs x 50' x 2 with supervision/min assist for steering.  Cues for efficiency and turns with fair carry- over.  Strengthening using Kinetron in sitting in w/c x 25 cycles x 2 focusing on quadriceps, x 20 cycles x 2 focusing on gluteals.  Reciprocal scooting forward in w/c with vc, in preparation for sit> stand.  Trial of 2 cm build up under R foot for sit> stand required mod assist.  Using bil hands clasped far forward, sit> squat (lifting hips) required max assist, x 5.  L buttock and entire LLE noted to be edematous.  PT consulted Deidre Ala, RN about using thigh high TED, who agreed.  PT donned L thight high TED.  Pt reported that LLE swells during the day when she is OOB.  Pt may benefit from L ELR.   Pt left resting in w/c with needs at hand, LLE resting elevated on wedge on top of trash can turned sideways.     Therapy Documentation Precautions:  Precautions Precautions: Fall Precaution Comments: Colostomy  Restrictions Weight Bearing Restrictions: No  Pain: Pain Assessment 4/10 Pain Type: Surgical pain Pain Location: Abdomen Pain Orientation: Left;Lower Pain Descriptors / Indicators: Aching Pain Onset: On-going Pain Intervention(s): Repositioned;Emotional support, medicated    See Function Navigator for Current Functional Status.   Therapy/Group: Individual Therapy  Taneesha Edgin 08/13/2018, 4:07 PM

## 2018-08-13 NOTE — Progress Notes (Signed)
Social Work Patient ID: Melody Casey, female   DOB: [birth year], 72 y.o.   MRN: [id number]   Have reviewed team conference with pt who is aware and agreeable with targeted d/c date of 9/19 and supervision goals overall.  She complains of continued poor endurance and feeling "weak".  She is hopeful she can reach these goals but guarded.  Aware that tx schedule has been alerted to accommodate for fatigue level, however, will need to monitor and increase to full schedule as endurance improves.  Will continue to follow.  Bentlie Catanzaro, LCSW

## 2018-08-14 ENCOUNTER — Inpatient Hospital Stay (HOSPITAL_COMMUNITY): Payer: Medicare Other

## 2018-08-14 ENCOUNTER — Inpatient Hospital Stay (HOSPITAL_COMMUNITY): Payer: Medicare Other | Admitting: Occupational Therapy

## 2018-08-14 ENCOUNTER — Inpatient Hospital Stay (HOSPITAL_COMMUNITY): Payer: Medicare Other | Admitting: Physical Therapy

## 2018-08-14 ENCOUNTER — Telehealth: Payer: Self-pay | Admitting: Cardiology

## 2018-08-14 LAB — BASIC METABOLIC PANEL
Anion gap: 7 (ref 5–15)
BUN: 6 mg/dL — AB (ref 8–23)
CO2: 22 mmol/L (ref 22–32)
Calcium: 9 mg/dL (ref 8.9–10.3)
Chloride: 103 mmol/L (ref 98–111)
Creatinine, Ser: 0.53 mg/dL (ref 0.44–1.00)
GFR calc Af Amer: >60 mL/min (ref >60)
GLUCOSE: 88 mg/dL (ref 70–99)
POTASSIUM: 4.4 mmol/L (ref 3.5–5.1)
Sodium: 132 mmol/L — ABNORMAL LOW (ref 135–145)

## 2018-08-14 LAB — CBC
HEMATOCRIT: 27.8 % — AB (ref 36.0–46.0)
Hemoglobin: 8.7 g/dL — ABNORMAL LOW (ref 12.0–15.0)
MCH: 27.4 pg (ref 26.0–34.0)
MCHC: 31.3 g/dL (ref 30.0–36.0)
MCV: 87.4 fL (ref 78.0–100.0)
PLATELETS: 337 10*3/uL (ref 150–400)
RBC: 3.18 MIL/uL — AB (ref 3.87–5.11)
RDW: 17.9 % — AB (ref 11.5–15.5)
WBC: 5.3 10*3/uL (ref 4.0–10.5)

## 2018-08-14 LAB — PROTIME-INR
INR: 2.64
Prothrombin Time: 27.9 seconds — ABNORMAL HIGH (ref 11.4–15.2)

## 2018-08-14 LAB — MAGNESIUM: Magnesium: 1.8 mg/dL (ref 1.7–2.4)

## 2018-08-14 MED ORDER — MINERAL OIL RE ENEM
1.0000 | ENEMA | Freq: Once | RECTAL | Status: AC
  Administered 2018-08-14: 1 via RECTAL
  Filled 2018-08-14: qty 1

## 2018-08-14 MED ORDER — WARFARIN SODIUM 5 MG PO TABS
5.0000 mg | ORAL_TABLET | Freq: Every day | ORAL | Status: DC
  Administered 2018-08-14 – 2018-08-22 (×9): 5 mg via ORAL
  Filled 2018-08-14 (×10): qty 1

## 2018-08-14 NOTE — Progress Notes (Signed)
[facility] for Heparin >> warfarin Indication: atrial fibrillation  Allergies  Allergen Reactions  . Miralax [Polyethylene Glycol] Swelling and Rash    Took [facility] brand developed rash. Patient states she tolerated name brand MiraLax in the past.  09/01/15. Patient states she had diffuse swelling including swelling of her lips.   . Vancomycin Rash and Shortness Of Breath  . Acetaminophen Hives  . Oxycodone-Acetaminophen Itching  . Sulfa Antibiotics Rash    All over rash  . Sulfacetamide Sodium Rash    All over rash  . Sulfasalazine Rash    All over rash All over rash  . Banana Other (See Comments)    Unknown  . Latex Rash  . Tape Rash   Patient Measurements: Height: 5\' 8"  (172.7 cm) Weight: 197 lb 15.6 oz (89.8 kg) IBW/kg (Calculated) : 63.9 Heparin Dosing Weight: 76 kg  Vital Signs: Temp: 97.7 F (36.5 C) (09/05 0523) Temp Source: Oral (09/05 0523) BP: 118/62 (09/05 0523) Pulse Rate: 85 (09/05 0523)  Labs: Recent Labs    08/12/18 0558 08/13/18 0540 08/14/18 0544  HGB 8.6* 8.7* 8.7*  HCT 27.5* 28.1* 27.8*  PLT 323 328 337  LABPROT 26.6* 28.8* 27.9*  INR 2.48 2.74 2.64  HEPARINUNFRC 0.47  --   --   CREATININE 0.46 0.52 0.53   Estimated Creatinine Clearance: 72.4 mL/min (by C-G formula based on SCr of 0.53 mg/dL).  Assessment: 37 yoF on Coumadin 5mg  for Afib PTA. Heparin bridge given pt in Afib RVR and CHADsVAsc 7. INR therapeutic today at 2.64. Hgb stable low at 8.7, plts wnl. PO intake has increased in the last couple days to 70-100%. K+ remains above 4. Mg still slightly below goal of 2.   No signs of bleeding noted.   Goal of Therapy:  INR 2-3 Heparin level 0.3-0.5 (lower goal) Monitor platelets by anticoagulation protocol: Yes   Plan:   Warfarin 5 mg qday Monitor daily heparin level / INR, CBC, s/s of bleed, and daily PO intake Mg level in AM  Onnie Boer, PharmD, [place], AAHIVP, CPP Infectious Disease  Pharmacist Pager: [phone] 08/14/2018 9:46 AM

## 2018-08-14 NOTE — Progress Notes (Signed)
Physical Therapy Weekly Progress Note  Patient Details  Name: Melody Casey MRN: [id number] Date of Birth: 01/05/1946  Beginning of progress report period: August 07, 2018 End of progress report period: August 14, 2018  Today's Date: 08/14/2018 PT Individual Time: 1300-1355 PT Individual Time Calculation (min): 55 min   Pt performs w/c mobility with min A in controlled environment x 75' with bilat UEs.  Sit <> stand with RW throughout session initially mod A, max A when fatigued.  Pt performs gait 2 x 12' with RW with mod A.  Cues for breathing, assist for balance with wt shifting when fatigued.  Standing tolerance with horseshoe toss 3 x 75 seconds with prolonged rest breaks in between due to LE fatigue.  Seated therex with 1.5# wts 2 x 15 LAQ, HS curl, hip abd/add, add squeeze.  Pt in better spirits and is pleased with progress.  Patient has met 2 of 2 short term goals.  Pt improving to mod/max A for transfers and sit <> stand, able to gait 12' with mod A with RW.  Patient continues to demonstrate the following deficits muscle weakness and decreased standing balance, decreased postural control and decreased balance strategies and therefore will continue to benefit from skilled PT intervention to increase functional independence with mobility.  Patient progressing toward long term goals..  Continue plan of care.  PT Short Term Goals Week 1:  PT Short Term Goal 1 (Week 1): pt will perform functional transfers with max A PT Short Term Goal 1 - Progress (Week 1): Met PT Short Term Goal 2 (Week 1): pt will perform gait 10' in controlled environment with mod A PT Short Term Goal 2 - Progress (Week 1): Met Week 2:  PT Short Term Goal 1 (Week 2): pt will perform gait with min A x 15' in controlled environment PT Short Term Goal 2 (Week 2): pt will perform functional transfers consistently with min A  Skilled Therapeutic Interventions/Progress Updates:  Ambulation/gait training;Discharge  planning;Therapeutic Activities;Functional mobility training;Balance/vestibular training;Neuromuscular re-education;Therapeutic Exercise;Wheelchair propulsion/positioning;DME/adaptive equipment instruction;Pain management;Splinting/orthotics;UE/LE Strength taining/ROM;UE/LE Coordination activities;Stair training;Patient/family education;Community reintegration   Therapy Documentation Precautions:  Precautions Precautions: Fall Precaution Comments: Colostomy  Restrictions Weight Bearing Restrictions: No Pain: No c/o pain  Therapy/Group: Individual Therapy  [name] 08/14/2018, 2:08 PM

## 2018-08-14 NOTE — Progress Notes (Signed)
Occupational Therapy Weekly Progress Note  Patient Details  Name: Melody Casey MRN: [id number] Date of Birth: 08/14/1946  Beginning of progress report period: August 07, 2018 End of progress report period: August 14, 2018  Today's Date: 08/14/2018 OT Individual Time: 1400-1450 OT Individual Time Calculation (min): 50 min    Patient has met 4 of 4 short term goals.  Pt has improved significantly in her therapy participation, and has thus made improvements in her independence and functional activity tolerance during ADL tasks.   Patient continues to demonstrate the following deficits: muscle weakness, decreased cardiorespiratoy endurance and decreased standing balance, decreased postural control and decreased balance strategies and therefore will continue to benefit from skilled OT intervention to enhance overall performance with BADL.  Patient progressing toward long term goals..  Continue plan of care.  OT Short Term Goals Week 1:  OT Short Term Goal 1 (Week 1): Pt will complete toilet transfer with mod A of 1  OT Short Term Goal 1 - Progress (Week 1): Met OT Short Term Goal 2 (Week 1): Pt will perform 1 step of ostomy care with min A OT Short Term Goal 2 - Progress (Week 1): Met OT Short Term Goal 3 (Week 1): Pt will complete 1 step of LB dressing task.  OT Short Term Goal 3 - Progress (Week 1): Met OT Short Term Goal 4 (Week 1): Pt will tolerate 1 minute standing in stedy in preparation for BADL task OT Short Term Goal 4 - Progress (Week 1): Met Week 2:  OT Short Term Goal 1 (Week 2): Pt will transfer to [facility] with min A OT Short Term Goal 2 (Week 2): Pt will don pants with min A OT Short Term Goal 3 (Week 2): Pt will tolerate 2 min of standing during functional activity in prep for more functional IADL particiaption   Skilled Therapeutic Interventions/Progress Updates:   Pt received sitting up in w/c with no c/o pain. Pt c/o edema in her L LE. Pt was transported down to therapy gym  for time management and completed stand pivot transfer to NuStep with min A, requiring mod A to power up from w/c. Pt completed 15 min on NuStep to increase B LE circulation to reduce swelling and to promote increased cardiorespiratory support during ADLs.  Discussion with pt re updates to new STGs and OT POC. Pt returned to room and completed stand pivot transfer with mod A to [facility] with vc for safety provided. Pt completed standing level clothing management and peri hygiene with min A balance support. Pt completed ostomy care with set up seated on BSC. Pt took 5 steps to [facility] with RW and returned to supine with CGA. RN entered room for ostomy edu, 10 min missed.  Therapy Documentation Precautions:  Precautions Precautions: Fall Precaution Comments: Colostomy  Restrictions Weight Bearing Restrictions: No Pain: Pain Assessment Pain Scale: 0-10 Pain Score: 0-No pain ADL: ADL ADL Comments: Please see functional navigator  See Function Navigator for Current Functional Status.   Therapy/Group: Individual Therapy  Curtis Sites 08/14/2018, 3:01 PM

## 2018-08-14 NOTE — Telephone Encounter (Signed)
Returned patient's call re no show fee.  I advised that she had just received a letter and would not be charged the fee d/t being an in-patient.  She understood.

## 2018-08-14 NOTE — Progress Notes (Signed)
Occupational Therapy Session Note  Patient Details  Name: Melody Casey MRN: [id number] Date of Birth: [birth year]  Today's Date: 08/14/2018 OT Individual Time: 7342-8768 OT Individual Time Calculation (min): 75 min    Short Term Goals: Week 1:  OT Short Term Goal 1 (Week 1): Pt will complete toilet transfer with mod A of 1  OT Short Term Goal 2 (Week 1): Pt will perform 1 step of ostomy care with min A OT Short Term Goal 3 (Week 1): Pt will complete 1 step of LB dressing task.  OT Short Term Goal 4 (Week 1): Pt will tolerate 1 minute standing in stedy in preparation for BADL task     Skilled Therapeutic Interventions/Progress Updates:    Pt seen this session to facilitate functional mobility with ADL skills. Pt sitting on EOB receiving medication from her nurse. Pt needed to empty out her ostomy bag. She was able to open the bag and empty all contents into container with A to hold container in place.  Her husband wanted to make sure the bag was well sealed so he closed the ba/cg. Pt completed bathing and dressing from EOB. She was able to do all UB self care with set up. Mod A with bathing LB and Total A with dressing. Pt will benefit from practice with a reacher and a long shoe horn.  Pt was able to complete sit to stands 4x from the bed with steadying A using good technique of pushing up from bed.  Once standing she used RW for support but was able to release hands to pull pants over hips with 50% assist.  Pt completed stand pivot with RW to w/c with min A using safe technique with keeping her body in the frame of the RW.  Pt sat in w/c to complete self care at the sink.  Pt in w/c with spouse in room with her and all needs met.  Therapy Documentation Precautions:  Precautions Precautions: Fall Precaution Comments: Colostomy  Restrictions Weight Bearing Restrictions: No    Vital Signs: Therapy Vitals Temp: 97.7 F (36.5 C) Temp Source: Oral Pulse Rate: 85 Resp: 15 BP:  118/62 Patient Position (if appropriate): Lying Oxygen Therapy SpO2: 97 % O2 Device: Room Air Pain: Pain Assessment Pain Scale: 0-10 Pain Score: 0-No pain ADL: ADL ADL Comments: Please see functional navigator  See Function Navigator for Current Functional Status.   Therapy/Group: Individual Therapy  [place] 08/14/2018, 8:44 AM

## 2018-08-14 NOTE — Plan of Care (Signed)
  Problem: RH BOWEL ELIMINATION Goal: RH STG MANAGE BOWEL WITH ASSISTANCE Description STG Manage Bowel with Assistance. Mod assist to manage ostomy  Outcome: Progressing   Problem: RH BLADDER ELIMINATION Goal: RH STG MANAGE BLADDER WITH ASSISTANCE Description STG Manage Bladder With Assistance. Min assist  Outcome: Progressing   Problem: RH SKIN INTEGRITY Goal: RH STG MAINTAIN SKIN INTEGRITY WITH ASSISTANCE Description STG Maintain Skin Integrity With Assistance. Mod assist  Outcome: Progressing Goal: RH STG ABLE TO PERFORM INCISION/WOUND CARE W/ASSISTANCE Description STG Able To Perform Incision/Wound Care With Assistance. Mod assist  Outcome: Progressing   Problem: RH PAIN MANAGEMENT Goal: RH STG PAIN MANAGED AT OR BELOW PT'S PAIN GOAL Description Less than 4  Outcome: Progressing   Problem: Education: Goal: Knowledge of ostomy care will improve Outcome: Progressing Goal: Understanding of discharge needs will improve Outcome: Progressing   Problem: Bowel/Gastric/Urinary: Goal: Gastrointestinal status for postoperative course will improve Outcome: Progressing   Problem: Consults Goal: RH GENERAL PATIENT EDUCATION Description See Patient Education module for education specifics. Outcome: Progressing

## 2018-08-14 NOTE — Progress Notes (Signed)
[facility] PHYSICAL MEDICINE & REHABILITATION     PROGRESS NOTE    Subjective/Complaints: Pt up in bed. No new complaints. Still frustrated that her stamina is not where she wants it to be  ROS: Patient denies fever, rash, sore throat, blurred vision, nausea, vomiting, diarrhea, cough, shortness of breath or chest pain, joint or back pain, headache, or mood change.    Objective:  No results found. Recent Labs    08/13/18 0540 08/14/18 0544  WBC 4.3 5.3  HGB 8.7* 8.7*  HCT 28.1* 27.8*  PLT 328 337   Recent Labs    08/13/18 0540 08/14/18 0544  NA 131* 132*  K 4.3 4.4  CL 104 103  GLUCOSE 88 88  BUN 6* 6*  CREATININE 0.52 0.53  CALCIUM 9.3 9.0   CBG (last 3)  No results for input(s): GLUCAP in the last 72 hours.  Wt Readings from Last 3 Encounters:  08/06/18 89.8 kg  08/04/18 75.3 kg  07/22/18 75.8 kg     Intake/Output Summary (Last 24 hours) at 08/14/2018 1001 Last data filed at 08/13/2018 1919 Gross per 24 hour  Intake 480 ml  Output -  Net 480 ml    Vital Signs: Blood pressure 118/62, pulse 85, temperature 97.7 F (36.5 C), temperature source Oral, resp. rate 15, height 5\' 8"  (1.727 m), weight 89.8 kg, SpO2 97 %. Physical Exam:  Constitutional: No distress . Vital signs reviewed. HEENT: EOMI, oral membranes moist Neck: supple Cardiovascular: RRR without murmur. No JVD    Respiratory: CTA Bilaterally without wheezes or rales. Normal effort    GI: BS +, sl-tender, non-distended, vac in place with ostomy Musc: No edema or tenderness in extremities. Musculoskeletal:  Left lower extremity edema, improving Right foot atrophy with contracture Neurological: She is alert and oriented Motor: Bilateral upper: 4/5 proximal distal Right lower extremity: Hip flexion, knee extension 3+/5, ankle contracture, improving Left lower extremity: Hip flexion, knee extension 2+/5, ankle 2+/5, improving  Skin: Skin is warm and dry. + VAC Psychiatric: cooperative a little  flat  Assessment/Plan: 1. Functional deficits secondary to debility after colectomy which require 3+ hours per day of interdisciplinary therapy in a comprehensive inpatient rehab setting. Physiatrist is providing close team supervision and 24 hour management of active medical problems listed below. Physiatrist and rehab team continue to assess barriers to discharge/monitor patient progress toward functional and medical goals.  Function:  Bathing Bathing position Bathing activity did not occur: Refused Position: Sitting EOB  Bathing parts Body parts bathed by patient: Right arm, Left arm, Chest, Right upper leg, Left upper leg, Front perineal area Body parts bathed by helper: Right lower leg, Left lower leg, Back, Buttocks  Bathing assist Assist Level: (moderate assist)      Upper Body Dressing/Undressing Upper body dressing Upper body dressing/undressing activity did not occur: Refused What is the patient wearing?: Pull over shirt/dress     Pull over shirt/dress - Perfomed by patient: Thread/unthread right sleeve, Put head through opening, Thread/unthread left sleeve, Pull shirt over trunk Pull over shirt/dress - Perfomed by helper: Pull shirt over trunk        Upper body assist Assist Level: Set up      Lower Body Dressing/Undressing Lower body dressing Lower body dressing/undressing activity did not occur: Refused What is the patient wearing?: Underwear, Pants, [employer], Shoes   Underwear - Performed by helper: Thread/unthread right underwear leg, Thread/unthread left underwear leg, Pull underwear up/down   Pants- Performed by helper: Thread/unthread right pants  leg, Thread/unthread left pants leg, Pull pants up/down   Non-skid slipper socks- Performed by helper: Don/doff left sock, Don/doff right sock       Shoes - Performed by helper: Don/doff right shoe, Don/doff left shoe       TED Hose - Performed by helper: Don/doff left TED hose  Lower body assist Assist for  lower body dressing: Touching or steadying assistance (Pt > 75%)      Toileting Toileting   Toileting steps completed by patient: Adjust clothing prior to toileting, Adjust clothing after toileting Toileting steps completed by helper: Adjust clothing prior to toileting, Performs perineal hygiene, Adjust clothing after toileting Toileting Assistive Devices: Prosthesis/orthosis  Toileting assist Assist level: Touching or steadying assistance (Pt.75%)   Transfers Chair/bed transfer   Chair/bed transfer method: Stand pivot Chair/bed transfer assist level: Touching or steadying assistance (Pt > 75%) Chair/bed transfer assistive device: Walker, Armrests Mechanical lift: Ecologist     Max distance: 5 Assist level: Moderate assist (Pt 50 - 74%)   Wheelchair   Type: Manual Max wheelchair distance: 50 Assist Level: Supervision or verbal cues  Cognition Comprehension Comprehension assist level: Follows complex conversation/direction with no assist  Expression Expression assist level: Expresses complex ideas: With no assist  Social Interaction Social Interaction assist level: Interacts appropriately 90% of the time - Needs monitoring or encouragement for participation or interaction.  Problem Solving Problem solving assist level: Solves basic problems with no assist  Memory/ Memory assist level: Recognizes or recalls 90% of the time/requires cueing < 10% of the time   Medical Problem List and Plan: 1.  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  DVT Prophylaxis/Anticoagulation: Heparin bridged to chronic Coumadin resumed for history of atrial fibrillation  INR therapeutic on 9/5 3. Pain Management: Ultram as needed 4. Mood: Xanax 0.25 mg 3 times daily as needed  -Neuropsych consult is requested  -Continue to provide positive reinforcement 5. Neuropsych: This  patient is capable of making decisions on her own behalf.  -team to provide ego support as needed 6. Skin/Wound Care:    -Vacuum therapy to midline wound 7. Fluids/Electrolytes/Nutrition: encourae PO 8.  Acute blood loss anemia.    hgb 8.7 on 9/5 9.  Atrial fibrillation.  Cardizem 360 mg daily, Tikosyn 250 mg twice daily.  Cardiac rate controlled 10.  Hypertension.  Norvasc 2.5 mg daily  Controlled on 9/4 11.  Rheumatoid arthritis.  Plaquenil 200 mg twice daily 12.  Hyperlipidemia.  Lipitor 13.  Left lower extremity edema: Dopplers from last week were negative.  She has no pain no limb.    Appears sl improved    Cont with TED stockings,  ACE wrap if needed 14. Hyponatremia  Sodium 132 on 9/5  Cont to monitor 15. Hypokalemia  Potassium 4.4 on 9/5  Cont daily potassium to 40 mEq twice daily  Mag 1.8 on 9/5---continue supp    LOS (Days) [place] EVALUATION WAS PERFORMED  [name], MD 08/14/2018 10:01 AM

## 2018-08-14 NOTE — Telephone Encounter (Signed)
New Message   Pt states she received a $50 No show fee for missing her appt on 07/31/18 but pt states she is in the hospital and has been this entire time. Please call

## 2018-08-15 ENCOUNTER — Inpatient Hospital Stay (HOSPITAL_COMMUNITY): Payer: Medicare Other | Admitting: Physical Therapy

## 2018-08-15 ENCOUNTER — Inpatient Hospital Stay (HOSPITAL_COMMUNITY): Payer: Medicare Other

## 2018-08-15 LAB — BASIC METABOLIC PANEL
Anion gap: 7 (ref 5–15)
BUN: 7 mg/dL — AB (ref 8–23)
CALCIUM: 9.5 mg/dL (ref 8.9–10.3)
CO2: 23 mmol/L (ref 22–32)
CREATININE: 0.52 mg/dL (ref 0.44–1.00)
Chloride: 104 mmol/L (ref 98–111)
GFR calc Af Amer: >60 mL/min (ref >60)
GLUCOSE: 107 mg/dL — AB (ref 70–99)
Potassium: 4.1 mmol/L (ref 3.5–5.1)
SODIUM: 134 mmol/L — AB (ref 135–145)

## 2018-08-15 LAB — PROTIME-INR
INR: 2.74
PROTHROMBIN TIME: 28.8 s — AB (ref 11.4–15.2)

## 2018-08-15 LAB — MAGNESIUM: Magnesium: 1.7 mg/dL (ref 1.7–2.4)

## 2018-08-15 MED ORDER — LIDOCAINE-EPINEPHRINE 2 %-1:100000 IJ SOLN
10.0000 mL | Freq: Once | INTRAMUSCULAR | Status: DC
  Filled 2018-08-15: qty 10

## 2018-08-15 MED ORDER — LIDOCAINE-EPINEPHRINE 2 %-1:100000 IJ SOLN
20.0000 mL | Freq: Once | INTRAMUSCULAR | Status: DC
  Filled 2018-08-15: qty 20

## 2018-08-15 MED ORDER — LIDOCAINE-EPINEPHRINE 1 %-1:100000 IJ SOLN
20.0000 mL | Freq: Once | INTRAMUSCULAR | Status: DC
  Filled 2018-08-15: qty 20

## 2018-08-15 MED ORDER — MAGNESIUM SULFATE 4 GM/100ML IV SOLN
4.0000 g | Freq: Once | INTRAVENOUS | Status: DC
  Administered 2018-08-15: 4 g via INTRAVENOUS

## 2018-08-15 MED ORDER — MAGNESIUM OXIDE 400 (241.3 MG) MG PO TABS
400.0000 mg | ORAL_TABLET | Freq: Two times a day (BID) | ORAL | Status: DC
  Administered 2018-08-15 – 2018-08-18 (×6): 400 mg via ORAL
  Filled 2018-08-15 (×6): qty 1

## 2018-08-15 MED ORDER — MAGNESIUM SULFATE 4 GM/100ML IV SOLN
4.0000 g | Freq: Once | INTRAVENOUS | Status: AC
  Administered 2018-08-15: 4 g via INTRAVENOUS
  Filled 2018-08-15: qty 100

## 2018-08-15 NOTE — Progress Notes (Signed)
Physical Therapy Session Note  Patient Details  Name: Melody Casey MRN: [id number] Date of Birth: 1946-04-04  Today's Date: 08/15/2018 PT Individual Time: 1300-1355 PT Individual Time Calculation (min): 55 min   Short Term Goals: Week 2:  PT Short Term Goal 1 (Week 2): pt will perform gait with min A x 15' in controlled environment PT Short Term Goal 2 (Week 2): pt will perform functional transfers consistently with min A  Skilled Therapeutic Interventions/Progress Updates:    pt in bed, agreeable to treatment.  Pt performs stand pivot transfers with RW with min A throughout session.  Gait training 50' x 5 throughout session with min A with RW.  Toilet transfers with grab bar with  Min A, pt able to perform clothing management with supervision.  nustep x 6 minutes level 6 for bilat UE/LE endurance. Pt's husband given home measurement sheet and states he will provide info for this weekend. Pt left in bed with needs at hand, alarm set, husband present.  Therapy Documentation Precautions:  Precautions Precautions: Fall Precaution Comments: Colostomy  Restrictions Weight Bearing Restrictions: No Pain:  no c/o pain   Therapy/Group: Individual Therapy  Andreea Arca 08/15/2018, 1:58 PM

## 2018-08-15 NOTE — Consult Note (Signed)
[facility] Nurse wound consult note Midline abdominal incision wound care performed using 1/2 inch plain packing moistened with saline.  The was placed into the undermining associated with the three small holes in the superior aspect of the wound and the tunnel at 6 o'clock at the inferior of the wound.  These were covered with saline moistened 4 x 4 gauze and ABD pads and taped in place. The superior aspect of the larger abdominal wound, has three distinct openings (holes).  The top openings measures 1 cm x 0.7 cm x 2.9 cm.  It has undermining at 9 o'clock that measures 2.7 cm; at 12 o'clock that measures 3.5 cm; and at 3 o'clock at measures 3 cm.    The opening just below this one measures 2 cm x 0.8 cm x 2 cm and tracts up and connects with the opening described above.  The third opening measures 0.7 cm x 0.8 cm x 2 cm.  The lower half of the overall wound has grey slough deep in the wound bed and that visible slough area measures 5 cm x 1 cm.  At the most inferior location (6 o'clock) just above the mons pubis she has tunneling that is 4 cm in length.  All of these areas of undermining and tunneling were packed with saline moistened packing strips with a tail exposed for removal, then larger saline moistened gauze and ABD pads. Monitor the wound area(s) for worsening of condition such as: Signs/symptoms of infection,  Increase in size,  Development of or worsening of odor, Development of pain, or increased pain at the affected locations.  Notify the medical team if any of these develop.  Thank you for the consult.  Discussed plan of care with the patient and bedside nurse.  [facility] nurse will not follow at this time.  Please re-consult the [facility] team if needed.  Val Riles, RN, MSN, CWOCN, CNS-BC, pager [phone]

## 2018-08-15 NOTE — Progress Notes (Signed)
Physical Therapy Session Note  Patient Details  Name: KOURTNEY TERRIQUEZ MRN: [id number] Date of Birth: 07-07-1946  Today's Date: 08/15/2018 PT Individual Time: 2505-3976 PT Individual Time Calculation (min): 30 min   Short Term Goals: Week 2:  PT Short Term Goal 1 (Week 2): pt will perform gait with min A x 15' in controlled environment PT Short Term Goal 2 (Week 2): pt will perform functional transfers consistently with min A  Skilled Therapeutic Interventions/Progress Updates:    Pt received seated in w/c in room, agreeable to PT. Pt reports ongoing swelling in BLE. ACE wrap applied to LLE and added L elevating leg rest. Standing balance with min A and RW while pants managed for ACE wrap application. SPT w/c to Nustep with RW and min A. Nustep level 3 x 5 min with B UE/LE. Pt left semi-reclined in bed with needs in reach at end of therapy session, needs in reach and bed alarm in place.  Therapy Documentation Precautions:  Precautions Precautions: Fall Precaution Comments: Colostomy  Restrictions Weight Bearing Restrictions: No  See Function Navigator for Current Functional Status.   Therapy/Group: Individual Therapy  Excell Seltzer, PT, DPT  08/15/2018, 3:12 PM

## 2018-08-15 NOTE — Progress Notes (Signed)
[facility] PHYSICAL MEDICINE & REHABILITATION     PROGRESS NOTE    Subjective/Complaints: Up in bed. No new issues.   ROS: Patient denies fever, rash, sore throat, blurred vision, nausea, vomiting, diarrhea, cough, shortness of breath or chest pain, joint or back pain, headache, or mood change.    Objective:  No results found. Recent Labs    08/13/18 0540 08/14/18 0544  WBC 4.3 5.3  HGB 8.7* 8.7*  HCT 28.1* 27.8*  PLT 328 337   Recent Labs    08/14/18 0544 08/15/18 0842  NA 132* 134*  K 4.4 4.1  CL 103 104  GLUCOSE 88 107*  BUN 6* 7*  CREATININE 0.53 0.52  CALCIUM 9.0 9.5   CBG (last 3)  No results for input(s): GLUCAP in the last 72 hours.  Wt Readings from Last 3 Encounters:  08/06/18 89.8 kg  08/04/18 75.3 kg  07/22/18 75.8 kg     Intake/Output Summary (Last 24 hours) at 08/15/2018 1024 Last data filed at 08/14/2018 2000 Gross per 24 hour  Intake 580 ml  Output 300 ml  Net 280 ml    Vital Signs: Blood pressure 107/62, pulse 92, temperature 97.9 F (36.6 C), temperature source Oral, resp. rate 16, height 5\' 8"  (1.727 m), weight 89.8 kg, SpO2 97 %. Physical Exam:  Constitutional: No distress . Vital signs reviewed. HEENT: EOMI, oral membranes moist Neck: supple Cardiovascular: RRR without murmur. No JVD    Respiratory: CTA Bilaterally without wheezes or rales. Normal effort    GI: BS +, sl-tender, non-distended, vac/ostomy in place   Musculoskeletal:  Left lower extremity edema 1+ Right foot atrophy with contracture Neurological: She is alert and oriented Motor: Bilateral upper: 4/5 proximal distal Right lower extremity: Hip flexion, knee extension 3+/5, ankle contracture, improving Left lower extremity: Hip flexion, knee extension 2+/5, ankle 2+/5, improving  Skin: skin warm/dry Psychiatric: pleasant  Assessment/Plan: 1. Functional deficits secondary to debility after colectomy which require 3+ hours per day of interdisciplinary therapy in a  comprehensive inpatient rehab setting. Physiatrist is providing close team supervision and 24 hour management of active medical problems listed below. Physiatrist and rehab team continue to assess barriers to discharge/monitor patient progress toward functional and medical goals.  Function:  Bathing Bathing position Bathing activity did not occur: Refused Position: Sitting EOB  Bathing parts Body parts bathed by patient: Right arm, Left arm, Chest, Right upper leg, Left upper leg, Front perineal area Body parts bathed by helper: Right lower leg, Left lower leg, Back, Buttocks  Bathing assist Assist Level: (moderate assist)      Upper Body Dressing/Undressing Upper body dressing Upper body dressing/undressing activity did not occur: Refused What is the patient wearing?: Pull over shirt/dress     Pull over shirt/dress - Perfomed by patient: Thread/unthread right sleeve, Put head through opening, Thread/unthread left sleeve, Pull shirt over trunk Pull over shirt/dress - Perfomed by helper: Pull shirt over trunk        Upper body assist Assist Level: Set up      Lower Body Dressing/Undressing Lower body dressing Lower body dressing/undressing activity did not occur: Refused What is the patient wearing?: Underwear, Pants, [employer], Shoes   Underwear - Performed by helper: Thread/unthread right underwear leg, Thread/unthread left underwear leg, Pull underwear up/down   Pants- Performed by helper: Thread/unthread right pants leg, Thread/unthread left pants leg, Pull pants up/down   Non-skid slipper socks- Performed by helper: Don/doff left sock, Don/doff right sock  Shoes - Performed by helper: Don/doff right shoe, Don/doff left shoe       TED Hose - Performed by helper: Don/doff left TED hose  Lower body assist Assist for lower body dressing: Touching or steadying assistance (Pt > 75%)      Toileting Toileting   Toileting steps completed by patient: Adjust clothing  prior to toileting, Adjust clothing after toileting Toileting steps completed by helper: Adjust clothing prior to toileting, Performs perineal hygiene, Adjust clothing after toileting Toileting Assistive Devices: Prosthesis/orthosis  Toileting assist Assist level: Touching or steadying assistance (Pt.75%)   Transfers Chair/bed transfer   Chair/bed transfer method: Stand pivot Chair/bed transfer assist level: Touching or steadying assistance (Pt > 75%) Chair/bed transfer assistive device: Walker, Armrests Mechanical lift: Ecologist     Max distance: 5 Assist level: Moderate assist (Pt 50 - 74%)   Wheelchair   Type: Manual Max wheelchair distance: 50 Assist Level: Supervision or verbal cues  Cognition Comprehension Comprehension assist level: Follows complex conversation/direction with no assist  Expression Expression assist level: Expresses complex ideas: With no assist  Social Interaction Social Interaction assist level: Interacts appropriately 90% of the time - Needs monitoring or encouragement for participation or interaction.  Problem Solving Problem solving assist level: Solves basic problems with no assist  Memory/ Memory assist level: Recognizes or recalls 90% of the time/requires cueing < 10% of the time   Medical Problem List and Plan: 1.  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  DVT Prophylaxis/Anticoagulation: Heparin bridged to chronic Coumadin resumed for history of atrial fibrillation  INR therapeutic on 9/6 3. Pain Management: Ultram as needed 4. Mood: Xanax 0.25 mg 3 times daily as needed  -Neuropsych consult is requested  -Continue to provide positive reinforcement 5. Neuropsych: This patient is capable of making decisions on her own behalf.  -team to provide ego support as needed 6. Skin/Wound Care:    -Vacuum therapy to midline  wound per surgery 7. Fluids/Electrolytes/Nutrition: encourae PO 8.  Acute blood loss anemia.    hgb 8.7 on 9/5 9.  Atrial fibrillation.  Cardizem 360 mg daily, Tikosyn 250 mg twice daily.  Cardiac rate controlled 10.  Hypertension.  Norvasc 2.5 mg daily  Controlled on 9/6 11.  Rheumatoid arthritis.  Plaquenil 200 mg twice daily 12.  Hyperlipidemia.  Lipitor 13.  Left lower extremity edema: Dopplers from last week were negative.  She has no pain no limb.    Appears sl improved    Cont with TED stockings,  ACE wrap if needed 14. Hyponatremia  Sodium 134 on 9/6  Cont to monitor 15. Hypokalemia  Potassium 4.1 on 9/6  Cont daily potassium to 40 mEq twice daily  Mag 1.7 on 9/6---increase daily supp, booster infusion today    LOS (Days) [place] EVALUATION WAS PERFORMED  [name], MD 08/15/2018 10:24 AM

## 2018-08-15 NOTE — Progress Notes (Signed)
[facility] for Heparin >> warfarin Indication: atrial fibrillation  Allergies  Allergen Reactions  . Miralax [Polyethylene Glycol] Swelling and Rash    Took [facility] brand developed rash. Patient states she tolerated name brand MiraLax in the past.  09/01/15. Patient states she had diffuse swelling including swelling of her lips.   . Vancomycin Rash and Shortness Of Breath  . Acetaminophen Hives  . Oxycodone-Acetaminophen Itching  . Sulfa Antibiotics Rash    All over rash  . Sulfacetamide Sodium Rash    All over rash  . Sulfasalazine Rash    All over rash All over rash  . Banana Other (See Comments)    Unknown  . Latex Rash  . Tape Rash   Patient Measurements: Height: 5\' 8"  (172.7 cm) Weight: 197 lb 15.6 oz (89.8 kg) IBW/kg (Calculated) : 63.9 Heparin Dosing Weight: 76 kg  Vital Signs: Temp: 97.9 F (36.6 C) (09/06 0605) Temp Source: Oral (09/06 0605) BP: 107/62 (09/06 0605) Pulse Rate: 92 (09/06 0605)  Labs: Recent Labs    08/13/18 0540 08/14/18 0544 08/15/18 0842  HGB 8.7* 8.7*  --   HCT 28.1* 27.8*  --   PLT 328 337  --   LABPROT 28.8* 27.9* 28.8*  INR 2.74 2.64 2.74  CREATININE 0.52 0.53  --    Estimated Creatinine Clearance: 72.4 mL/min (by C-G formula based on SCr of 0.53 mg/dL).  Assessment: 46 yoF on Coumadin 5mg  for Afib PTA. Heparin bridge given pt in Afib RVR and CHADsVAsc 7. INR therapeutic today at 2.74. Hgb stable low at 8.7, plts wnl. PO intake has increased in the last couple days to 70-100%. K+ remains above 4. Mg still slightly below goal of 2. Repeating labs today. If low, will request replacement.   No signs of bleeding noted.   Goal of Therapy:  INR 2-3 Heparin level 0.3-0.5 (lower goal) Monitor platelets by anticoagulation protocol: Yes   Plan:   Warfarin 5 mg qday Change INR to TTS F/u with mg/k+  Onnie Boer, PharmD, BCIDP, AAHIVP, CPP Infectious Disease Pharmacist Pager:  [phone] 08/15/2018 9:16 AM

## 2018-08-15 NOTE — Progress Notes (Signed)
Patient resting throughout shift, respiration unlabored on room air, Wound Vac in place with small amount of drainage note, ostomy site unremarkable, formed stool noted, Assisted up to [facility] tolerated well,, Anticipating wound Vac and  Dressing change today with some  anxiety noted, Support provided Continue medical  [place],call bell and bed alarm in placem mpnitor

## 2018-08-15 NOTE — Progress Notes (Signed)
Occupational Therapy Session Note  Patient Details  Name: Melody Casey MRN: [id number] Date of Birth: 1946/03/28  Today's Date: 08/15/2018 OT Individual Time: 2182-8833 OT Individual Time Calculation (min): 60 min    Short Term Goals: Week 2:  OT Short Term Goal 1 (Week 2): Pt will transfer to [facility] with min A OT Short Term Goal 2 (Week 2): Pt will don pants with min A OT Short Term Goal 3 (Week 2): Pt will tolerate 2 min of standing during functional activity in prep for more functional IADL particiaption   Skilled Therapeutic Interventions/Progress Updates:    Pt supine in bed with c/o pain in abdomen at her surgical site, pt reported pain subsidized with increased ambulation. Pt transferred to [facility] from EOB with min lifting A and min A for clothing management. Pt completed bathing sitting on BSC, UB with set up and min A for distal R LE. Pt required min A to don underwear seated for reaching R LE. With edu re threading weak LE first, pt able to thread B LE through pants with CGA. Pt able to pull up pants in standing with CGA and transfer to EOB. Pt took 8 steps to sink and completed oral care in standing with CGA, leaning on sink occasionally. Pt completed 20 ft of functional mobility with RW and CGA-min A and vc for RW management. Lastly, pt completed 25 ft of wc propulsion to increase B UE strength/endurance during ADLs. Pt returned to room and was left sitting up in w/c with all needs met.  Therapy Documentation Precautions:  Precautions Precautions: Fall Precaution Comments: Colostomy  Restrictions Weight Bearing Restrictions: No   Pain: Pain Assessment Pain Scale: Faces Faces Pain Scale: Hurts a little bit Pain Type: Surgical pain Pain Location: Abdomen Pain Orientation: Left;Lower Pain Descriptors / Indicators: Aching Pain Intervention(s): Emotional support;Ambulation/increased activity ADL: ADL ADL Comments: Please see functional navigator  See Function Navigator for  Current Functional Status.   Therapy/Group: Individual Therapy  Curtis Sites 08/15/2018, 10:19 AM

## 2018-08-15 NOTE — Progress Notes (Addendum)
Evaluated patient wound this AM with WOC RN.  Stoma viable, making stool.  Midline incision with 3 areas of tunneling in proximal 30% of incision (see photo in media). Inferior aspect of incision with some slough and small amount undermining. Transition from [facility] to BID wet to dry dressing changes with 1/4 inch packing to tunneled areas. Continue prune juice daily.  Patient should continue to work with nursing staff and [facility] RN to change ostomy appliance independently. She has discussed wound care with her husband - we will set up a time early next week to change patients midline wound with her husband present and provide some wound care education so they are comfortable managing wound at home.  Obie Dredge, PA-C [place] [facility] Pager: [phone]

## 2018-08-16 ENCOUNTER — Inpatient Hospital Stay (HOSPITAL_COMMUNITY): Payer: Medicare Other

## 2018-08-16 ENCOUNTER — Inpatient Hospital Stay (HOSPITAL_COMMUNITY): Payer: Medicare Other | Admitting: Physical Therapy

## 2018-08-16 LAB — BASIC METABOLIC PANEL
ANION GAP: 8 (ref 5–15)
BUN: 9 mg/dL (ref 8–23)
CALCIUM: 9 mg/dL (ref 8.9–10.3)
CO2: 22 mmol/L (ref 22–32)
Chloride: 104 mmol/L (ref 98–111)
Creatinine, Ser: 0.54 mg/dL (ref 0.44–1.00)
GLUCOSE: 92 mg/dL (ref 70–99)
POTASSIUM: 4.3 mmol/L (ref 3.5–5.1)
SODIUM: 134 mmol/L — AB (ref 135–145)

## 2018-08-16 LAB — PROTIME-INR
INR: 2.67
PROTHROMBIN TIME: 28.2 s — AB (ref 11.4–15.2)

## 2018-08-16 LAB — MAGNESIUM: MAGNESIUM: 2.2 mg/dL (ref 1.7–2.4)

## 2018-08-16 NOTE — Progress Notes (Signed)
Melody Casey is a 72 y.o. female 21-Mar-1946 [id number]  Subjective: No new complaints. No new problems. Slept well.  The patient is getting dressed.  Objective: Vital signs in last 24 hours: Temp:  [97.7 F (36.5 C)-98.1 F (36.7 C)] 98.1 F (36.7 C) (09/07 1449) Pulse Rate:  [86-100] 86 (09/07 1449) Resp:  [16-18] 16 (09/07 1449) BP: (112-139)/(61-64) 112/64 (09/07 1449) SpO2:  [97 %-98 %] 97 % (09/07 1449) Weight change:  Last BM Date: 08/14/18(colostomy)  Intake/Output from previous day: 09/06 0701 - 09/07 0700 In: 480 [P.O.:480] Out: 250 [Urine:250] Last cbgs: CBG (last 3)  No results for input(s): GLUCAP in the last 72 hours.   Physical Exam General: No apparent distress   HEENT: not dry Lungs: Normal effort. Lungs clear to auscultation, no crackles or wheezes. Cardiovascular: Regular rate and rhythm, no edema Abdomen: S/NT/ND; BS(+) Musculoskeletal:  unchanged Neurological: No new neurological deficits Wounds: Abdominal wound is dressed.  Colostomy bag is on the left Skin: clear  Aging changes Mental state: Alert, oriented, cooperative    Lab Results: BMET    Component Value Date/Time   NA 134 (L) 08/16/2018 0546   NA 132 (L) 07/22/2018 1632   NA 134 (L) 06/06/2017 1259   K 4.3 08/16/2018 0546   K 4.5 06/06/2017 1259   CL 104 08/16/2018 0546   CO2 22 08/16/2018 0546   CO2 24 06/06/2017 1259   GLUCOSE 92 08/16/2018 0546   GLUCOSE 311 (H) 06/06/2017 1259   BUN 9 08/16/2018 0546   BUN 32 (H) 07/22/2018 1632   BUN 18.3 06/06/2017 1259   CREATININE 0.54 08/16/2018 0546   CREATININE 1.0 06/06/2017 1259   CALCIUM 9.0 08/16/2018 0546   CALCIUM 10.6 (H) 06/06/2017 1259   GFRNONAA >60 08/16/2018 0546   GFRNONAA 80 06/22/2015 1614   GFRAA >60 08/16/2018 0546   GFRAA >89 06/22/2015 1614   CBC    Component Value Date/Time   WBC 5.3 08/14/2018 0544   RBC 3.18 (L) 08/14/2018 0544   HGB 8.7 (L) 08/14/2018 0544   HGB 13.3 07/22/2018 1632   HGB 12.5  06/06/2017 1259   HCT 27.8 (L) 08/14/2018 0544   HCT 40.4 07/22/2018 1632   HCT 38.3 06/06/2017 1259   PLT 337 08/14/2018 0544   PLT 204 07/22/2018 1632   MCV 87.4 08/14/2018 0544   MCV 87 07/22/2018 1632   MCV 87.2 06/06/2017 1259   MCH 27.4 08/14/2018 0544   MCHC 31.3 08/14/2018 0544   RDW 17.9 (H) 08/14/2018 0544   RDW 17.4 (H) 07/22/2018 1632   RDW 15.7 (H) 06/06/2017 1259   LYMPHSABS 0.7 08/07/2018 0700   LYMPHSABS 1.4 07/22/2018 1632   LYMPHSABS 0.8 (L) 06/06/2017 1259   MONOABS 0.5 08/07/2018 0700   MONOABS 0.2 06/06/2017 1259   EOSABS 0.0 08/07/2018 0700   EOSABS 0.0 07/22/2018 1632   BASOSABS 0.0 08/07/2018 0700   BASOSABS 0.0 07/22/2018 1632   BASOSABS 0.0 06/06/2017 1259    Studies/Results: No results found.  Medications: I have reviewed the patient's current medications.  Assessment/Plan:   1.  Debility secondary to perforated sigmoid colon/recurrent diverticulitis.  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  CIR. 2.  DVT prophylaxis.  Heparin bridge to chronic Coumadin 3.  Pain management.  Ultram as needed 4.  Anxiety.  Xanax as needed 5.  Wound care.  Vacuum therapy at the midline wound per surgery 6.  Colostomy care 7.  Blood loss anemia.  Monitoring hemoglobin  8.  Atrial fibrillation.  Cardizem, Tikosyn.  Bridging to Coumadin. 9.  Hypertension.  Blood pressures reviewed 10.  Rheumatoid arthritis.  On Plaquenil twice daily 11.  Hyperlipidemia.  On Lipitor 12.  Lower extremity edema.  Could be related to calcium channel blockers 13.  Hyponatremia.  Continue to monitor 14.  Hypokalemia.  Continue with daily potassium.  Monitoring magnesium and blood chemistry    Length of stay, days: 10  Walker Kehr , MD 08/16/2018, 7:10 PM

## 2018-08-16 NOTE — Progress Notes (Signed)
Physical Therapy Session Note  Patient Details  Name: Melody Casey MRN: [id number] Date of Birth: Nov 02, 1946  Today's Date: 08/16/2018 PT Individual Time: 0800-0915 PT Individual Time Calculation (min): 75 min   Short Term Goals: Week 2:  PT Short Term Goal 1 (Week 2): pt will perform gait with min A x 15' in controlled environment PT Short Term Goal 2 (Week 2): pt will perform functional transfers consistently with min A  Skilled Therapeutic Interventions/Progress Updates:    Pt received supine in bed, agreeable to PT. No complaints of pain. Bed mobility Supervision with use of bedrails. ACE wrap applied to LLE for edema management. Sit to stand with min A to RW. Ambulation x 15 ft to bathroom with RW and min A. Pt is setup assist for pericare and colostomy bag management, min A to don pants in standing. Ambulation 2 x 50 ft, 1 x 70 ft (one standing rest break), 1 x 90 ft (2 standing rest breaks) with RW and min A. Focus on upright standing posture and decreased BUE support on RW with gait as well as safe turns to sit in w/c. Nustep level 4 x 10 min with one seated rest break with B UE/LE for global endurance. Sit to supine min A for LLE management. Pt left semi-reclined in bed with needs in reach and bed alarm in place.  Therapy Documentation Precautions:  Precautions Precautions: Fall Precaution Comments: Colostomy  Restrictions Weight Bearing Restrictions: No  See Function Navigator for Current Functional Status.   Therapy/Group: Individual Therapy  Excell Seltzer, PT, DPT  08/16/2018, 9:59 AM

## 2018-08-16 NOTE — Progress Notes (Signed)
[facility] for Heparin >> warfarin Indication: atrial fibrillation  Allergies  Allergen Reactions  . Miralax [Polyethylene Glycol] Swelling and Rash    Took [facility] brand developed rash. Patient states she tolerated name brand MiraLax in the past.  09/01/15. Patient states she had diffuse swelling including swelling of her lips.   . Vancomycin Rash and Shortness Of Breath  . Acetaminophen Hives  . Oxycodone-Acetaminophen Itching  . Sulfa Antibiotics Rash    All over rash  . Sulfacetamide Sodium Rash    All over rash  . Sulfasalazine Rash    All over rash All over rash  . Banana Other (See Comments)    Unknown  . Latex Rash  . Tape Rash   Patient Measurements: Height: 5\' 8"  (172.7 cm) Weight: 197 lb 15.6 oz (89.8 kg) IBW/kg (Calculated) : 63.9 Heparin Dosing Weight: 76 kg  Vital Signs: Temp: 97.7 F (36.5 C) (09/07 0412) Temp Source: Oral (09/07 0412) BP: 139/64 (09/07 0412) Pulse Rate: 95 (09/07 0412)  Labs: Recent Labs    08/14/18 0544 08/15/18 0842 08/16/18 0546 08/16/18 1036  HGB 8.7*  --   --   --   HCT 27.8*  --   --   --   PLT 337  --   --   --   LABPROT 27.9* 28.8*  --  28.2*  INR 2.64 2.74  --  2.67  CREATININE 0.53 0.52 0.54  --    Estimated Creatinine Clearance: 72.4 mL/min (by C-G formula based on SCr of 0.54 mg/dL).  Assessment: 22 yoF on Coumadin 5mg  for Afib PTA. Heparin bridge given pt in Afib RVR and CHADsVAsc 7. INR continues to be therapeutic today at 2.67. Hgb stable low at 8.7, plts wnl. No signs of bleeding noted.   Goal of Therapy:  INR 2-3 Heparin level 0.3-0.5 (lower goal) Monitor platelets by anticoagulation protocol: Yes   Plan:  Continue with PTA Warfarin 5 mg qday INR checks MWF  Thank you for involving pharmacy in this patient's care.   Janae Bridgeman, PharmD PGY1 Pharmacy Resident Phone: [phone] 08/16/2018 11:21 AM

## 2018-08-16 NOTE — Progress Notes (Signed)
Occupational Therapy Session Note  Patient Details  Name: Melody Casey MRN: [id number] Date of Birth: 10/08/1946  Today's Date: 08/16/2018 OT Group Time: 1100-1200 OT Group Time Calculation (min): 60 min    Short Term Goals: Week 1:  OT Short Term Goal 1 (Week 1): Pt will complete toilet transfer with mod A of 1  OT Short Term Goal 1 - Progress (Week 1): Met OT Short Term Goal 2 (Week 1): Pt will perform 1 step of ostomy care with min A OT Short Term Goal 2 - Progress (Week 1): Met OT Short Term Goal 3 (Week 1): Pt will complete 1 step of LB dressing task.  OT Short Term Goal 3 - Progress (Week 1): Met OT Short Term Goal 4 (Week 1): Pt will tolerate 1 minute standing in stedy in preparation for BADL task OT Short Term Goal 4 - Progress (Week 1): Met  Skilled Therapeutic Interventions/Progress Updates:    Pt participates in skilled therapeutic tx group of making a "no sew" blanket  to be donated to the local animal shelter focusing on BUE use, social participation, static standing with no UE support, standing endurance and fine motor coordination. Pt uses BUE to fold and measure lines, mark fabric with marker, cut strips of cloth with scissors, and tie knots on blanket. Pt stands for ~2 min with supervision for static and dynamic standing balance reaching for needed materials. Pt conversational throughout with other group members. Exited session with pt escorted back to room and set up with lunch  Therapy Documentation Precautions:  Precautions Precautions: Fall Precaution Comments: Colostomy  Restrictions Weight Bearing Restrictions: No General:    See Function Navigator for Current Functional Status.   Therapy/Group: Individual Therapy  Tonny Branch 08/16/2018, 12:16 PM

## 2018-08-17 ENCOUNTER — Inpatient Hospital Stay (HOSPITAL_COMMUNITY): Payer: Medicare Other | Admitting: Physical Therapy

## 2018-08-17 ENCOUNTER — Inpatient Hospital Stay (HOSPITAL_COMMUNITY): Payer: Medicare Other | Admitting: Occupational Therapy

## 2018-08-17 LAB — BASIC METABOLIC PANEL
Anion gap: 5 (ref 5–15)
BUN: 9 mg/dL (ref 8–23)
CALCIUM: 9.2 mg/dL (ref 8.9–10.3)
CO2: 23 mmol/L (ref 22–32)
Chloride: 107 mmol/L (ref 98–111)
Creatinine, Ser: 0.56 mg/dL (ref 0.44–1.00)
GFR calc Af Amer: >60 mL/min (ref >60)
Glucose, Bld: 113 mg/dL — ABNORMAL HIGH (ref 70–99)
Potassium: 4.5 mmol/L (ref 3.5–5.1)
Sodium: 135 mmol/L (ref 135–145)

## 2018-08-17 LAB — MAGNESIUM: Magnesium: 2 mg/dL (ref 1.7–2.4)

## 2018-08-17 NOTE — Progress Notes (Signed)
Physical Therapy Session Note  Patient Details  Name: Melody Casey MRN: [id number] Date of Birth: Dec 05, 1946  Today's Date: 08/17/2018 PT Individual Time: 0830-0945 PT Individual Time Calculation (min): 75 min   Short Term Goals: Week 2:  PT Short Term Goal 1 (Week 2): pt will perform gait with min A x 15' in controlled environment PT Short Term Goal 2 (Week 2): pt will perform functional transfers consistently with min A  Skilled Therapeutic Interventions/Progress Updates:    no c/o pain at rest, but states she's having a hard day; PT provided emotional support and encouragement throughout session.  Session focus on activity tolerance and balance with ADLs, self care tasks, and functional mobility.   Pt transitions to EOB with distant supervision using bed features, and dresses EOB with set up assist.  Close supervision for dynamic sitting balance with lower body dressing, and sit<>stand to pull pants over hips.  Pt able to ambulate within room with RW and supervision, min cues for walker positioning for safety.  Pt manages ostomy with set up assist for supplies.  W/C propulsion to therapy gym with supervision and more than a reasonable amount of time.  Min verbal cues for increase R stroke to maintain straight pathway.    Standing balance during card matching and table top activities.  Standing on foam to match 18 cards with alternating UE support on RW, progress to 18 cards with only intermittent UE support on RW for ankle strategy, balance, and activity tolerance with min guard<>min assist for balance.  Seated rest break between trials 2/2 fatigue.  Standing on foam wedge for hip strategy, balance, and activity tolerance during table top activity, pt unable to complete 2/2 fatigue.  Following extended seated rest break, pt ambulates back to room with RW, supervision overall with min cues to stay inside walker during turns.  Positioned in recliner at end of session with call bell in reach and  needs met.   Therapy Documentation Precautions:  Precautions Precautions: Fall Precaution Comments: Colostomy  Restrictions Weight Bearing Restrictions: No  See Function Navigator for Current Functional Status.   Therapy/Group: Individual Therapy  Michel Santee 08/17/2018, 9:21 AM

## 2018-08-17 NOTE — Progress Notes (Signed)
Melody Casey is a 72 y.o. female 06-27-1946 [id number]  Subjective: No new complaints. No new problems. Slept well. Feeling OK.  Objective: Vital signs in last 24 hours: Temp:  [98.1 F (36.7 C)-98.5 F (36.9 C)] 98.2 F (36.8 C) (09/08 0504) Pulse Rate:  [86-94] 91 (09/08 0504) Resp:  [16-17] 16 (09/08 0504) BP: (112-126)/(64-65) 123/65 (09/08 0504) SpO2:  [95 %-97 %] 97 % (09/08 0504) Weight change:  Last BM Date: 08/17/18  Intake/Output from previous day: 09/07 0701 - 09/08 0700 In: 250 [P.O.:250] Out: 400 [Stool:400] Last cbgs: CBG (last 3)  No results for input(s): GLUCAP in the last 72 hours.   Physical Exam General: No apparent distress   HEENT: not dry Lungs: Normal effort. Lungs clear to auscultation, no crackles or wheezes. Cardiovascular: Regular rate and rhythm, no edema Abdomen: S/NT/ND; BS(+) Colostomy bag Musculoskeletal:  unchanged Neurological: No new neurological deficits Wounds: dressed   Skin: clear  Aging changes Mental state: Alert, oriented, cooperative    Lab Results: BMET    Component Value Date/Time   NA 135 08/17/2018 0638   NA 132 (L) 07/22/2018 1632   NA 134 (L) 06/06/2017 1259   K 4.5 08/17/2018 0638   K 4.5 06/06/2017 1259   CL 107 08/17/2018 0638   CO2 23 08/17/2018 0638   CO2 24 06/06/2017 1259   GLUCOSE 113 (H) 08/17/2018 0638   GLUCOSE 311 (H) 06/06/2017 1259   BUN 9 08/17/2018 0638   BUN 32 (H) 07/22/2018 1632   BUN 18.3 06/06/2017 1259   CREATININE 0.56 08/17/2018 0638   CREATININE 1.0 06/06/2017 1259   CALCIUM 9.2 08/17/2018 0638   CALCIUM 10.6 (H) 06/06/2017 1259   GFRNONAA >60 08/17/2018 0638   GFRNONAA 80 06/22/2015 1614   GFRAA >60 08/17/2018 0638   GFRAA >89 06/22/2015 1614   CBC    Component Value Date/Time   WBC 5.3 08/14/2018 0544   RBC 3.18 (L) 08/14/2018 0544   HGB 8.7 (L) 08/14/2018 0544   HGB 13.3 07/22/2018 1632   HGB 12.5 06/06/2017 1259   HCT 27.8 (L) 08/14/2018 0544   HCT 40.4 07/22/2018  1632   HCT 38.3 06/06/2017 1259   PLT 337 08/14/2018 0544   PLT 204 07/22/2018 1632   MCV 87.4 08/14/2018 0544   MCV 87 07/22/2018 1632   MCV 87.2 06/06/2017 1259   MCH 27.4 08/14/2018 0544   MCHC 31.3 08/14/2018 0544   RDW 17.9 (H) 08/14/2018 0544   RDW 17.4 (H) 07/22/2018 1632   RDW 15.7 (H) 06/06/2017 1259   LYMPHSABS 0.7 08/07/2018 0700   LYMPHSABS 1.4 07/22/2018 1632   LYMPHSABS 0.8 (L) 06/06/2017 1259   MONOABS 0.5 08/07/2018 0700   MONOABS 0.2 06/06/2017 1259   EOSABS 0.0 08/07/2018 0700   EOSABS 0.0 07/22/2018 1632   BASOSABS 0.0 08/07/2018 0700   BASOSABS 0.0 07/22/2018 1632   BASOSABS 0.0 06/06/2017 1259    Studies/Results: No results found.  Medications: I have reviewed the patient's current medications.  Assessment/Plan:    1.  Debility secondary to perforated sigmoid colon.  Regarding diverticulitis.  Status post left colectomy.  Status post colostomy on 07/25/2018.  Continue with CIR 2.  DVT prophylaxis with heparin bridge to chronic Coumadin 3.  Pain management.  Continue with Ultram as needed 4.  Anxiety.  Xanax as needed 5.  Wound care. 6.  Colostomy care 7.  Blood loss anemia.  Monitor hemoglobins 8.  Atrial fibrillation.  Continue Cardizem, Tikosyn.  Bridging to Coumadin  9.  Hypertension. 10.  Rheumatoid arthritis.  Continue with Plaquenil twice daily 11.  Hyperlipidemia.  Continue with Lipitor 12.  Lower extremity edema.  It may be related to calcium channel blockers 13.  Hyponatremia.  Continue to watch 14.  Hypokalemia.  Daily potassium.  Monitoring magnesium and blood chemistry.    Length of stay, days: [place] , MD 08/17/2018, 2:34 PM

## 2018-08-17 NOTE — Progress Notes (Signed)
Occupational Therapy Session Note  Patient Details  Name: Melody Casey MRN: [id number] Date of Birth: 27-Dec-1945  Today's Date: 08/17/2018 OT Group Time: 1101-1201 OT Group Time Calculation (min): 60 min  Skilled Therapeutic Interventions/Progress Updates:    Pt participated in therapeutic w/c level dance group with focus on UE/LE strengthening, activity tolerance, and social participation for carryover during self care tasks. Pt was guided through various dance-based exercises involving UB/LB and trunk. She opted to sit vs stand. Pt conversed with other group members, requested songs, smiled, and took minimal rest breaks. At end of session pt self propelled back to room.    Therapy Documentation Precautions:  Precautions Precautions: Fall Precaution Comments: Colostomy  Restrictions Weight Bearing Restrictions: No Vital Signs: Therapy Vitals Pulse Rate: 88 Resp: 16 BP: (!) 115/56 Patient Position (if appropriate): Lying Oxygen Therapy SpO2: 95 % O2 Device: Room Air Pain: No c/o pain during session    ADL: ADL ADL Comments: Please see functional navigator    See Function Navigator for Current Functional Status.   Therapy/Group: Group Therapy  Lynne Righi A Zaide Kardell 08/17/2018, 4:17 PM

## 2018-08-18 ENCOUNTER — Inpatient Hospital Stay (HOSPITAL_COMMUNITY): Payer: Medicare Other

## 2018-08-18 ENCOUNTER — Inpatient Hospital Stay (HOSPITAL_COMMUNITY): Payer: Medicare Other | Admitting: Occupational Therapy

## 2018-08-18 ENCOUNTER — Inpatient Hospital Stay (HOSPITAL_COMMUNITY): Payer: Medicare Other | Admitting: Physical Therapy

## 2018-08-18 LAB — BASIC METABOLIC PANEL
Anion gap: 5 (ref 5–15)
BUN: 9 mg/dL (ref 8–23)
CO2: 21 mmol/L — AB (ref 22–32)
CREATININE: 0.51 mg/dL (ref 0.44–1.00)
Calcium: 9.2 mg/dL (ref 8.9–10.3)
Chloride: 110 mmol/L (ref 98–111)
GFR calc Af Amer: >60 mL/min (ref >60)
GFR calc non Af Amer: >60 mL/min (ref >60)
GLUCOSE: 91 mg/dL (ref 70–99)
POTASSIUM: 4.4 mmol/L (ref 3.5–5.1)
Sodium: 136 mmol/L (ref 135–145)

## 2018-08-18 LAB — PROTIME-INR
INR: 2.38
Prothrombin Time: 25.8 seconds — ABNORMAL HIGH (ref 11.4–15.2)

## 2018-08-18 LAB — MAGNESIUM: MAGNESIUM: 1.8 mg/dL (ref 1.7–2.4)

## 2018-08-18 MED ORDER — MAGNESIUM SULFATE 4 GM/100ML IV SOLN
4.0000 g | Freq: Once | INTRAVENOUS | Status: AC
  Administered 2018-08-18: 4 g via INTRAVENOUS
  Filled 2018-08-18: qty 100

## 2018-08-18 MED ORDER — MAGNESIUM OXIDE 400 (241.3 MG) MG PO TABS
400.0000 mg | ORAL_TABLET | Freq: Three times a day (TID) | ORAL | Status: DC
  Administered 2018-08-18 – 2018-08-23 (×15): 400 mg via ORAL
  Filled 2018-08-18 (×16): qty 1

## 2018-08-18 NOTE — Progress Notes (Signed)
[facility] PHYSICAL MEDICINE & REHABILITATION     PROGRESS NOTE    Subjective/Complaints: No major issues over weekend. Still fatigued. Sleeping when I entered  ROS: Patient denies fever, rash, sore throat, blurred vision,   cough, shortness of breath or chest pain, joint or back pain, headache, or mood change.    Objective:  No results found. No results for input(s): WBC, HGB, HCT, PLT in the last 72 hours. Recent Labs    08/17/18 0638 08/18/18 0532  NA 135 136  K 4.5 4.4  CL 107 110  GLUCOSE 113* 91  BUN 9 9  CREATININE 0.56 0.51  CALCIUM 9.2 9.2   CBG (last 3)  No results for input(s): GLUCAP in the last 72 hours.  Wt Readings from Last 3 Encounters:  08/06/18 89.8 kg  08/04/18 75.3 kg  07/22/18 75.8 kg     Intake/Output Summary (Last 24 hours) at 08/18/2018 1040 Last data filed at 08/18/2018 0720 Gross per 24 hour  Intake 720 ml  Output 300 ml  Net 420 ml    Vital Signs: Blood pressure (!) 111/56, pulse 93, temperature 98.1 F (36.7 C), temperature source Oral, resp. rate 16, height 5\' 8"  (1.727 m), weight 89.8 kg, SpO2 97 %. Physical Exam:  Constitutional: No distress . Vital signs reviewed. HEENT: EOMI, oral membranes moist Neck: supple Cardiovascular: RRR without murmur. No JVD    Respiratory: CTA Bilaterally without wheezes or rales. Normal effort    GI: BS +,  tender, non-distended, ostomy, abdominal wound dressed Musculoskeletal:  Left lower extremity edema 1+ Right foot atrophy with contracture Neurological: She is alert and oriented Motor: Bilateral upper: 4/5 proximal distal Right lower extremity: Hip flexion, knee extension 3+/5, ankle contracture, improving Left lower extremity: Hip flexion, knee extension 2+/5, ankle 2+/5, improving  Skin: skin warm/dry Psychiatric: pleasant  Assessment/Plan: 1. Functional deficits secondary to debility after colectomy which require 3+ hours per day of interdisciplinary therapy in a comprehensive  inpatient rehab setting. Physiatrist is providing close team supervision and 24 hour management of active medical problems listed below. Physiatrist and rehab team continue to assess barriers to discharge/monitor patient progress toward functional and medical goals.  Function:  Bathing Bathing position Bathing activity did not occur: Refused Position: Other (comment)(on BSC)  Bathing parts Body parts bathed by patient: Right arm, Left arm, Chest, Right upper leg, Left upper leg, Front perineal area, Left lower leg, Abdomen, Right lower leg, Buttocks, Back Body parts bathed by helper: Right lower leg, Left lower leg, Back, Buttocks  Bathing assist Assist Level: Supervision or verbal cues      Upper Body Dressing/Undressing Upper body dressing Upper body dressing/undressing activity did not occur: Refused What is the patient wearing?: Pull over shirt/dress     Pull over shirt/dress - Perfomed by patient: Thread/unthread right sleeve, Put head through opening, Thread/unthread left sleeve, Pull shirt over trunk Pull over shirt/dress - Perfomed by helper: Pull shirt over trunk        Upper body assist Assist Level: Set up   Set up : To obtain clothing/put away  Lower Body Dressing/Undressing Lower body dressing Lower body dressing/undressing activity did not occur: Refused What is the patient wearing?: Underwear, Pants Underwear - Performed by patient: Thread/unthread right underwear leg, Pull underwear up/down, Thread/unthread left underwear leg Underwear - Performed by helper: Thread/unthread right underwear leg, Thread/unthread left underwear leg, Pull underwear up/down Pants- Performed by patient: Thread/unthread right pants leg, Thread/unthread left pants leg, Pull pants up/down Pants- Performed by  helper: Thread/unthread right pants leg, Thread/unthread left pants leg, Pull pants up/down   Non-skid slipper socks- Performed by helper: Don/doff left sock, Don/doff right sock        Shoes - Performed by helper: Don/doff right shoe, Don/doff left shoe       TED Hose - Performed by helper: Don/doff left TED hose  Lower body assist Assist for lower body dressing: Supervision or verbal cues      Toileting Toileting   Toileting steps completed by patient: Adjust clothing after toileting, Adjust clothing prior to toileting, Performs perineal hygiene Toileting steps completed by helper: Adjust clothing prior to toileting, Performs perineal hygiene, Adjust clothing after toileting Toileting Assistive Devices: Prosthesis/orthosis  Toileting assist Assist level: Touching or steadying assistance (Pt.75%)   Transfers Chair/bed transfer   Chair/bed transfer method: Stand pivot, Ambulatory Chair/bed transfer assist level: Supervision or verbal cues Chair/bed transfer assistive device: Armrests, Walker Mechanical lift: Ecologist     Max distance: 90 Assist level: Supervision or verbal cues   Wheelchair   Type: Manual Max wheelchair distance: 90 Assist Level: Supervision or verbal cues  Cognition Comprehension Comprehension assist level: Follows complex conversation/direction with no assist  Expression Expression assist level: Expresses complex ideas: With no assist  Social Interaction Social Interaction assist level: Interacts appropriately 90% of the time - Needs monitoring or encouragement for participation or interaction.  Problem Solving Problem solving assist level: Solves basic problems with no assist  Memory/ Memory assist level: Recognizes or recalls 90% of the time/requires cueing < 10% of the time   Medical Problem List and Plan: 1.  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  DVT Prophylaxis/Anticoagulation: Heparin bridged to chronic Coumadin resumed for history of atrial fibrillation  INR therapeutic   3. Pain Management:  Ultram as needed 4. Mood: Xanax 0.25 mg 3 times daily as needed  -Neuropsych consult is requested  -Continue to provide positive reinforcement 5. Neuropsych: This patient is capable of making decisions on her own behalf.  -team to provide ego support as needed 6. Skin/Wound Care:    -BID dressings per surgery 7. Fluids/Electrolytes/Nutrition: encourae PO 8.  Acute blood loss anemia.    hgb 8.7 on 9/5 9.  Atrial fibrillation.  Cardizem 360 mg daily, Tikosyn 250 mg twice daily.  Cardiac rate controlled 10.  Hypertension.  Norvasc 2.5 mg daily  Controlled on 9/9 11.  Rheumatoid arthritis.  Plaquenil 200 mg twice daily 12.  Hyperlipidemia.  Lipitor 13.  Left lower extremity edema: Dopplers from last week were negative.  She has no pain no limb.    Appears sl improved    Cont with TED stockings,  ACE wrap if needed 14. Hyponatremia  Sodium 136 on 9/9  Cont to monitor 15. Hypokalemia  Potassium 4.4 9/9  Cont daily potassium to 40 mEq twice daily  Mag 1.8 on 9/9---increase daily supp to TID, another booster infusion today was ordered    LOS (Days) Imogene EVALUATION WAS PERFORMED  [name], MD 08/18/2018 10:40 AM

## 2018-08-18 NOTE — Progress Notes (Signed)
RN informed pt that a scheduled dressing change was about to be performed. Pt informed RN that the dressing change had been performed earlier in the morning by the "nurse and student RN". When told that the dressing change was scheduled for two times per day, the pt replied that the dressing change had also been performed in the afternoon by the wound care nurse. RN checked EMAR and did not see documentation for the evening dressing change. RN made pt aware and pt continued to refuse the dressing change. RN will continue to monitor pt and reinforce education.   Eliseo Squires, RN 08/18/2018 2227

## 2018-08-18 NOTE — Progress Notes (Signed)
Evaluated midline wound this AM. Midline incision with beefy red granulation tissue and gauze packing in tunnels. Scant drainage. Stoma viable with soft stool present. Patient states stool has been soft since she started drinking apple juice.  Continue BID dressing changes. Continue apple/prune juice daily. Mineral oil prn for constipation as well. We will see 1-2 times weekly while patient remains admitted.  Brigid Re , [facility] Surgery 08/18/2018, 9:46 AM Pager: [phone] Consults: [phone] Mon-Fri 7:00 am-4:30 pm Sat-Sun 7:00 am-11:30 am

## 2018-08-18 NOTE — Progress Notes (Signed)
[place] for Heparin >> warfarin Indication: atrial fibrillation  Allergies  Allergen Reactions  . Miralax [Polyethylene Glycol] Swelling and Rash    Took [facility] brand developed rash. Patient states she tolerated name brand MiraLax in the past.  09/01/15. Patient states she had diffuse swelling including swelling of her lips.   . Vancomycin Rash and Shortness Of Breath  . Acetaminophen Hives  . Oxycodone-Acetaminophen Itching  . Sulfa Antibiotics Rash    All over rash  . Sulfacetamide Sodium Rash    All over rash  . Sulfasalazine Rash    All over rash All over rash  . Banana Other (See Comments)    Unknown  . Latex Rash  . Tape Rash   Patient Measurements: Height: 5\' 8"  (172.7 cm) Weight: 197 lb 15.6 oz (89.8 kg) IBW/kg (Calculated) : 63.9 Heparin Dosing Weight: 76 kg  Vital Signs: Temp: 98.1 F (36.7 C) (09/09 0837) Temp Source: Oral (09/09 0837) BP: 111/56 (09/09 0837) Pulse Rate: 93 (09/09 0837)  Labs: Recent Labs    08/16/18 0546 08/16/18 1036 08/17/18 0638 08/18/18 0532  LABPROT  --  28.2*  --  25.8*  INR  --  2.67  --  2.38  CREATININE 0.54  --  0.56 0.51   Estimated Creatinine Clearance: 72.4 mL/min (by C-G formula based on SCr of 0.51 mg/dL).  Assessment: 37 yoF on Coumadin 5mg  for Afib PTA. Heparin bridge given pt in Afib RVR and CHADsVAsc 7. INR continues to be therapeutic at 2.38. Hgb stable low at 8.7, plts wnl. No signs of bleeding noted.   Continue to lose mag due to GI issue. Consider using daily IV replacement mag rather than PO.  Goal of Therapy:  INR 2-3 Heparin level 0.3-0.5 (lower goal) Monitor platelets by anticoagulation protocol: Yes   Plan:  Continue with PTA Warfarin 5 mg qday INR checks MWF Consider daily IV mag  Onnie Boer, PharmD, [place], AAHIVP, CPP Infectious Disease Pharmacist Pager: [phone] 08/18/2018 9:57 AM

## 2018-08-18 NOTE — Progress Notes (Signed)
Occupational Therapy Session Note  Patient Details  Name: Melody Casey MRN: [id number] Date of Birth: [birth year]  Today's Date: 08/18/2018 OT Individual Time: 1133-1201 OT Individual Time Calculation (min): 28 min    Short Term Goals: Week 2:  OT Short Term Goal 1 (Week 2): Pt will transfer to [facility] with min A OT Short Term Goal 2 (Week 2): Pt will don pants with min A OT Short Term Goal 3 (Week 2): Pt will tolerate 2 min of standing during functional activity in prep for more functional IADL particiaption   Skilled Therapeutic Interventions/Progress Updates:    Pt presents sitting up in w/c with no c/o pain agreeable to therapy session. Transported pt totalA via w/c to therapy gym. Pt engaged in standing activity of checkers with focus on standing balance and endurance. Pt stood x2 rounds of approx 5-6 min during activity with minguard for standing balance, pt initiating seated rest breaks PRN. Returned to room in manner described above, pt ambulating within room from w/c to bathroom with minguard-minA and VCs for safe RW use. Pt performing toileting with overall minguard assist for peri-care and clothing management. Pt exiting room and transferring to supine in bed with minguard-minA for dynamic balance during mobility. Pt left supine in bed end of session with bed alarm set, call bell and needs within reach.   Therapy Documentation Precautions:  Precautions Precautions: Fall Precaution Comments: Colostomy  Restrictions Weight Bearing Restrictions: No     ADL: ADL ADL Comments: Please see functional navigator  See Function Navigator for Current Functional Status.   Therapy/Group: Individual Therapy  Raymondo Band 08/18/2018, 12:56 PM

## 2018-08-18 NOTE — Progress Notes (Addendum)
    CC:  Subjective: Doing well, getting OOB, she has not changed the ostomy bag, but has emptied it.  Tolerating diet, ostomy working well.    Objective: Vital signs in last 24 hours: Temp:  [97.7 F (36.5 C)-98.1 F (36.7 C)] 98.1 F (36.7 C) (09/09 0837) Pulse Rate:  [73-93] 93 (09/09 0837) Resp:  [16-17] 16 (09/09 0837) BP: (104-115)/(54-60) 111/56 (09/09 0837) SpO2:  [95 %-97 %] 97 % (09/09 0837) Last BM Date: 08/17/18 480 PO Urine x 4 Stool 250 recorded from the ostomy Afebrile, VSS BMP OK, Creatinine is 0.51 NR 2.38  Intake/Output from previous day: 09/08 0701 - 09/09 0700 In: 480 [P.O.:480] Out: 250 [Stool:250] Intake/Output this shift: Total I/O In: 240 [P.O.:240] Out: 100 [Stool:100]    [place] gauze in the pen upper areas, wet to dry dressing other sites.    Lab Results:  No results for input(s): WBC, HGB, HCT, PLT in the last 72 hours.  BMET Recent Labs    08/17/18 0638 08/18/18 0532  NA 135 136  K 4.5 4.4  CL 107 110  CO2 23 21*  GLUCOSE 113* 91  BUN 9 9  CREATININE 0.56 0.51  CALCIUM 9.2 9.2   PT/INR Recent Labs    08/16/18 1036 08/18/18 0532  LABPROT 28.2* 25.8*  INR 2.67 2.38    No results for input(s): AST, ALT, ALKPHOS, BILITOT, PROT, ALBUMIN in the last 168 hours.   Lipase     Component Value Date/Time   LIPASE 24 07/24/2018 0944     Medications: . amLODipine  2.5 mg Oral Daily  . atorvastatin  40 mg Oral q1800  . diltiazem  360 mg Oral Daily  . dofetilide  250 mcg Oral BID  . famotidine  20 mg Oral BID  . feeding supplement (ENSURE ENLIVE)  237 mL Oral BID BM  . hydroxychloroquine  200 mg Oral BID  . lidocaine-EPINEPHrine  20 mL Intradermal Once  . magnesium oxide  400 mg Oral BID  . potassium chloride  40 mEq Oral BID  . warfarin  5 mg Oral q1800  . 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 Donne Hazel, 08/16 -bowel function stable -soft diet, continueensure - VAC M/W/F with white foam in base - converted to wet to dry 08/15/18 - PT/OT, per CIR  AYT:KZSW, ensure VTE: SCD's, lovenox FU:XNATF 08/15-08/21 Follow up:Dr. Donne Hazel  Plan:  Continue wet to dry dressings.  Gently wipe the inside of the wound with wet 4 x 4 with each dressing change      LOS: 12 days    Ellisa Devivo 08/18/2018 [phone]

## 2018-08-18 NOTE — Progress Notes (Signed)
Physical Therapy Session Note  Patient Details  Name: Melody Casey MRN: [id number] Date of Birth: May 06, 1946  Today's Date: 08/18/2018 PT Individual Time: 1345-1440 PT Individual Time Calculation (min): 55 min   Short Term Goals: Week 2:  PT Short Term Goal 1 (Week 2): pt will perform gait with min A x 15' in controlled environment PT Short Term Goal 2 (Week 2): pt will perform functional transfers consistently with min A  Skilled Therapeutic Interventions/Progress Updates:    pt performs all sit <> stand transfers with RW throughout session with supervision and increased time.  Furniture transfer training per home measurement sheet to 61'' sofa and 26'' bed with supervision.  Stair negotiation x 4 steps with bilat handrails with min guard to simulate home entry.  Gait training with RW 100' x 2, 150' with supervision.  nustep for LE/UE strength and endurance level 4 x 7 minutes.  Discussed beginning family ed with pt's husband. Pt agreeable and states she will have her husband come in this week. Pt left in bed with needs at hand.  Therapy Documentation Precautions:  Precautions Precautions: Fall Precaution Comments: Colostomy  Restrictions Weight Bearing Restrictions: No Pain:  no c/o pain   Therapy/Group: Individual Therapy  Amil Moseman 08/18/2018, 2:40 PM

## 2018-08-18 NOTE — Progress Notes (Signed)
Occupational Therapy Session Note  Patient Details  Name: Melody Casey MRN: [id number] Date of Birth: August 14, 1946  Today's Date: 08/18/2018 OT Individual Time: 5956-3875 OT Individual Time Calculation (min): 60 min    Short Term Goals: Week 2:  OT Short Term Goal 1 (Week 2): Pt will transfer to [facility] with min A OT Short Term Goal 2 (Week 2): Pt will don pants with min A OT Short Term Goal 3 (Week 2): Pt will tolerate 2 min of standing during functional activity in prep for more functional IADL particiaption   Skilled Therapeutic Interventions/Progress Updates:    Pt supine in bed with PA present completing wound care. Pt completed bed mobility and sit to stand with (S). Pt required min A to doff brief from L LE, but was able to don pants with (S) once vc for threading weaker LE first was provided. Pt completed functional mobility into bathroom and completed 3/3 toileting tasks with (S). Pt was brought down to ADL apartment and completed bed transfer with accurate height to home bed with (S). Vc provided for technique to manage LE. Edu provided re potential use of bed rails at home and where to purchase. Pt completed 65 ft of functional mobility with RW and close (S). Discussed with pt moving up d/c d/t recent progress toward OT POC. Pt was returned to room and left sitting up in w/c with all needs met.   Therapy Documentation Precautions:  Precautions Precautions: Fall Precaution Comments: Colostomy  Restrictions Weight Bearing Restrictions: No Vital Signs: Therapy Vitals Temp: 98.1 F (36.7 C) Temp Source: Oral Pulse Rate: 93 Resp: 16 BP: (!) 111/56 Patient Position (if appropriate): Lying Oxygen Therapy SpO2: 97 % O2 Device: Room Air Pain: Pain Assessment Pain Scale: 0-10 Pain Score: 2  Pain Type: Other (Comment);Surgical pain(from wound care) Pain Location: Abdomen Pain Orientation: Lower Pain Descriptors / Indicators: Aching Pain Onset: On-going Pain Intervention(s):  Ambulation/increased activity ADL: ADL ADL Comments: Please see functional navigator  See Function Navigator for Current Functional Status.   Therapy/Group: Individual Therapy  Curtis Sites 08/18/2018, 12:03 PM

## 2018-08-19 ENCOUNTER — Inpatient Hospital Stay (HOSPITAL_COMMUNITY): Payer: Medicare Other | Admitting: Physical Therapy

## 2018-08-19 ENCOUNTER — Inpatient Hospital Stay (HOSPITAL_COMMUNITY): Payer: Medicare Other

## 2018-08-19 LAB — BASIC METABOLIC PANEL
Anion gap: 10 (ref 5–15)
BUN: 9 mg/dL (ref 8–23)
CHLORIDE: 103 mmol/L (ref 98–111)
CO2: 20 mmol/L — ABNORMAL LOW (ref 22–32)
CREATININE: 0.52 mg/dL (ref 0.44–1.00)
Calcium: 9.3 mg/dL (ref 8.9–10.3)
GFR calc Af Amer: >60 mL/min (ref >60)
Glucose, Bld: 101 mg/dL — ABNORMAL HIGH (ref 70–99)
Potassium: 4.1 mmol/L (ref 3.5–5.1)
SODIUM: 133 mmol/L — AB (ref 135–145)

## 2018-08-19 LAB — MAGNESIUM: MAGNESIUM: 2.3 mg/dL (ref 1.7–2.4)

## 2018-08-19 NOTE — Patient Care Conference (Signed)
Inpatient RehabilitationTeam Conference and Plan of Care Update Date: 08/19/2018   Time: 2:15 PM    Patient Name: Melody Casey      Medical Record Number: [id number]  Date of Birth: 1946/03/10 Sex: Female         Room/Bed: 4W12C/4W12C-01 Payor Info: Payor: MEDICARE / Plan: MEDICARE PART A AND B / Product Type: *No Product type* /    Admitting Diagnosis: Bowel perforation  Admit Date/Time:  08/06/2018  4:48 PM Admission Comments: No comment available   Primary Diagnosis:  <principal problem not specified> Principal Problem: <principal problem not specified>  Patient Active Problem List   Diagnosis Date Noted  . Benign essential HTN   . Chronic anticoagulation   . Hypokalemia   . Edema of left lower extremity   . Atrial fibrillation ([place])   . Debility 08/06/2018  . Acute blood loss anemia   . PAF (paroxysmal atrial fibrillation) ([place])   . Anxiety state   . Sepsis ([place]) 07/24/2018  . Acute diverticulitis of intestine 07/24/2018  . Bowel perforation ([place]) 07/24/2018  . Hyponatremia 07/24/2018  . Osteoporosis 08/27/2017  . Low vitamin D level 06/04/2017  . Frequent UTI 10/25/2015  . Microcytic anemia 08/30/2015  . Diverticulosis 08/30/2015  . Hemangioma   . TIA (transient ischemic attack) 08/13/2015  . Atrial fibrillation with rapid ventricular response ([place])   . Rheumatoid arthritis ([place]) 06/20/2015  . AAA (abdominal aortic aneurysm) without rupture ([place]) 01/28/2015  . Persistent atrial fibrillation ([place]) 11/17/2014  . History of scleroderma 02/17/2014  . Malignant neoplasm of upper-outer quadrant of right breast in female, estrogen receptor positive ([place]) 02/01/2014  . Anxiety and depression 12/07/2011  . High risk medication use 06/14/2011  . CAD (coronary artery disease)   . Type 2 diabetes mellitus with complication, without long-term current use of insulin ([place]) 06/07/2008  . HLD (hyperlipidemia) 07/22/2007  . Essential hypertension 07/22/2007    Expected Discharge  Date: Expected Discharge Date: 08/23/18  Team Members Present: Physician leading conference: Dr. Alger Simons Social Worker Present: Ovidio Kin, LCSW Nurse Present: Frances Maywood, RN PT Present: Roderic Ovens, PT OT Present: Other (comment)(Sandra Davis-OT) SLP Present: Weston Anna, SLP PPS Coordinator present : Ileana Ladd, PT     Current Status/Progress Goal Weekly Team Focus  Medical   gradual progress. abdominal wound healing. pushing po  improve activity tolerance  nutrition, woud care   Bowel/Bladder   Continent bowel/bladder  Maintain bowel/bladder continence   Assist with toileting PRN    Swallow/Nutrition/ Hydration             ADL's   Pt completes UB with set up, CGA for LB dressing. More consistently performs transfers with CGA. Great improvement in therapy participation   supervision   functional mobility, LB ADLs   Mobility   supervision/min A transfers and gait  supervision overall  endurance, strength, family ed   Communication             Safety/Cognition/ Behavioral Observations            Pain   Pain managed effectively by tramadol   Maintain pain of <4  Manage pain PRN    Skin   Midline abdominal incision wet-to-dry dressing BID. Packing to 3 abdominal incisions BID. L ostomy  Maintain skin integrity and promote healing   Reinforce importance of wound care to pt. Perform dressing changes according to prescription.       *See Care Plan and progress notes for long and short-term goals.  Barriers to Discharge  Current Status/Progress Possible Resolutions Date Resolved   Physician    Medical stability        education of pt/family re: ostomy/wound mgt      Nursing                  PT                    OT                  SLP                SW                Discharge Planning/Teaching Needs:  Home with husband who can provide 24 hr care, will come in prior to discharge and learn pt's care.      Team Discussion:   Progressing toward her goals of supervision level. Has made great strides in therapies this week and can move up discharge date. Vac removed and now doing wet to dry dressing. Will need education on wet to dry dressings and ostomy care. Nursing beginning education with pt, husband and friend-RN.  Revisions to Treatment Plan:  DC 9/14    Continued Need for Acute Rehabilitation Level of Care: The patient requires daily medical management by a physician with specialized training in physical medicine and rehabilitation for the following conditions: Daily direction of a multidisciplinary physical rehabilitation program to ensure safe treatment while eliciting the highest outcome that is of practical value to the patient.: Yes Daily medical management of patient stability for increased activity during participation in an intensive rehabilitation regime.: Yes Daily analysis of laboratory values and/or radiology reports with any subsequent need for medication adjustment of medical intervention for : Wound care problems;Other   I attest that I was present, lead the team conference, and concur with the assessment and plan of the team.   Elease Hashimoto 08/19/2018, 3:26 PM

## 2018-08-19 NOTE — Progress Notes (Signed)
[facility] PHYSICAL MEDICINE & REHABILITATION     PROGRESS NOTE    Subjective/Complaints: Overall improving. Noted that her husband and friend who's a nurse need to come in for wound care education. She acknowledges that she needs to be able to tend to wounds also. Asked why she's on cardizem  ROS: Patient denies fever, rash, sore throat, blurred vision, nausea, vomiting, diarrhea, cough, shortness of breath or chest pain, joint or back pain, headache, or mood change.   Objective:  No results found. No results for input(s): WBC, HGB, HCT, PLT in the last 72 hours. Recent Labs    08/18/18 0532 08/19/18 0730  NA 136 133*  K 4.4 4.1  CL 110 103  GLUCOSE 91 101*  BUN 9 9  CREATININE 0.51 0.52  CALCIUM 9.2 9.3   CBG (last 3)  No results for input(s): GLUCAP in the last 72 hours.  Wt Readings from Last 3 Encounters:  08/06/18 89.8 kg  08/04/18 75.3 kg  07/22/18 75.8 kg     Intake/Output Summary (Last 24 hours) at 08/19/2018 0858 Last data filed at 08/19/2018 0832 Gross per 24 hour  Intake 480 ml  Output -  Net 480 ml    Vital Signs: Blood pressure (!) 114/58, pulse 86, temperature 98 F (36.7 C), temperature source Oral, resp. rate 18, height 5\' 8"  (1.727 m), weight 89.8 kg, SpO2 95 %. Physical Exam:  Constitutional: No distress . Vital signs reviewed. HEENT: EOMI, oral membranes moist Neck: supple Cardiovascular: RRR without murmur. No JVD    Respiratory: CTA Bilaterally without wheezes or rales. Normal effort    GI: BS +, non-tender, abd wound dressed ,ostomy Musculoskeletal:  Left lower extremity edema 1+ Right foot atrophy with contracture Neurological: She is alert and oriented Motor: Bilateral upper: 4/5 proximal distal Right lower extremity: Hip flexion, knee extension 4-/5, ankle contracture sl present, improving Left lower extremity: Hip flexion, knee extension 3/5, ankle 3/5, improving  Skin: skin warm/dry Psychiatric: pleasant,more  animated  Assessment/Plan: 1. Functional deficits secondary to debility after colectomy which require 3+ hours per day of interdisciplinary therapy in a comprehensive inpatient rehab setting. Physiatrist is providing close team supervision and 24 hour management of active medical problems listed below. Physiatrist and rehab team continue to assess barriers to discharge/monitor patient progress toward functional and medical goals.  Function:  Bathing Bathing position Bathing activity did not occur: Refused Position: Other (comment)(on BSC)  Bathing parts Body parts bathed by patient: Right arm, Left arm, Chest, Right upper leg, Left upper leg, Front perineal area, Left lower leg, Abdomen, Right lower leg, Buttocks, Back Body parts bathed by helper: Right lower leg, Left lower leg, Back, Buttocks  Bathing assist Assist Level: Supervision or verbal cues      Upper Body Dressing/Undressing Upper body dressing Upper body dressing/undressing activity did not occur: Refused What is the patient wearing?: Pull over shirt/dress     Pull over shirt/dress - Perfomed by patient: Thread/unthread right sleeve, Put head through opening, Thread/unthread left sleeve, Pull shirt over trunk Pull over shirt/dress - Perfomed by helper: Pull shirt over trunk        Upper body assist Assist Level: Set up   Set up : To obtain clothing/put away  Lower Body Dressing/Undressing Lower body dressing Lower body dressing/undressing activity did not occur: Refused What is the patient wearing?: Underwear, Pants Underwear - Performed by patient: Thread/unthread right underwear leg, Pull underwear up/down, Thread/unthread left underwear leg Underwear - Performed by helper: Thread/unthread right underwear  leg, Thread/unthread left underwear leg, Pull underwear up/down Pants- Performed by patient: Thread/unthread right pants leg, Thread/unthread left pants leg, Pull pants up/down Pants- Performed by helper:  Thread/unthread right pants leg, Thread/unthread left pants leg, Pull pants up/down   Non-skid slipper socks- Performed by helper: Don/doff left sock, Don/doff right sock       Shoes - Performed by helper: Don/doff right shoe, Don/doff left shoe       TED Hose - Performed by helper: Don/doff left TED hose  Lower body assist Assist for lower body dressing: Supervision or verbal cues      Toileting Toileting   Toileting steps completed by patient: Adjust clothing after toileting, Adjust clothing prior to toileting, Performs perineal hygiene Toileting steps completed by helper: Adjust clothing prior to toileting, Performs perineal hygiene, Adjust clothing after toileting Toileting Assistive Devices: Grab bar or rail  Toileting assist Assist level: Touching or steadying assistance (Pt.75%)   Transfers Chair/bed transfer   Chair/bed transfer method: Stand pivot, Ambulatory Chair/bed transfer assist level: Supervision or verbal cues Chair/bed transfer assistive device: Armrests, Environmental manager lift: Ecologist     Max distance: 90 Assist level: Supervision or verbal cues   Wheelchair   Type: Manual Max wheelchair distance: 90 Assist Level: Supervision or verbal cues  Cognition Comprehension Comprehension assist level: Follows complex conversation/direction with no assist  Expression Expression assist level: Expresses complex ideas: With no assist  Social Interaction Social Interaction assist level: Interacts appropriately 90% of the time - Needs monitoring or encouragement for participation or interaction.  Problem Solving Problem solving assist level: Solves basic problems with no assist  Memory/ Memory assist level: Recognizes or recalls 90% of the time/requires cueing < 10% of the time   Medical Problem List and Plan: 1.  Debility secondary to perforated sigmoid colon/recurrent diverticulitis status post left colectomy, end transverse colostomy  07/25/2018  Continue CIR  -team conference today  -family ed for wound/ostomy care 2.  DVT Prophylaxis/Anticoagulation: Heparin bridged to chronic Coumadin resumed for history of atrial fibrillation  INR therapeutic  9.9 3. Pain Management: Ultram as needed 4. Mood: Xanax 0.25 mg 3 times daily as needed  -Neuropsych consult is requested  -pt's mood has become more positive 5. Neuropsych: This patient is capable of making decisions on her own behalf.  -team to provide ego support as needed 6. Skin/Wound Care:    -BID w-d dressings per surgery 7. Fluids/Electrolytes/Nutrition: encourae PO 8.  Acute blood loss anemia.    hgb 8.7 on 9/5 9.  Atrial fibrillation.  Cardizem 360 mg daily, Tikosyn 250 mg twice daily.  Cardiac rate controlled  -explained that she is now on cardizem per cardiology recs d/t RVR while on acute sie of hospital 10.  Hypertension.  Norvasc 2.5 mg daily  Controlled on 9/9 11.  Rheumatoid arthritis.  Plaquenil 200 mg twice daily 12.  Hyperlipidemia.  Lipitor 13.  Left lower extremity edema: Dopplers from last week were negative.  She has no pain no limb.    Appears sl improved    Cont with TED stockings,  ACE wrap if needed 14. Hyponatremia  Sodium 133 on 9/9  Cont to monitor 15. Hypokalemia  Potassium 4.1 9/10  Cont daily potassium to 40 mEq twice daily  Mag 2.3 on 9/10--maintain magox TID    LOS (Days) [place] EVALUATION WAS PERFORMED  [name], MD 08/19/2018 8:58 AM

## 2018-08-19 NOTE — Progress Notes (Signed)
Social Work Patient ID: Melody Casey, female   DOB: [birth year], 71 y.o.   MRN: [id number]  Met with pt to discuss team conference goals supervision level and reaching these goals sooner than expected. Have moved up discharge date to 9/14. Pt is agreeable and feels she will be ready to go home on Sat as long as she is comfortable with the ostomy and wound care dressings. She has begun this with nursing today and feels in the next few days she will be comfortable with. Work on discharge needs for Sat.

## 2018-08-19 NOTE — Progress Notes (Signed)
Occupational Therapy Session Note  Patient Details  Name: Melody Casey MRN: [id number] Date of Birth: 11/02/1946  Today's Date: 08/19/2018 OT Individual Time: 0930-1030 OT Individual Time Calculation (min): 60 min    Short Term Goals: Week 2:  OT Short Term Goal 1 (Week 2): Pt will transfer to [facility] with min A OT Short Term Goal 2 (Week 2): Pt will don pants with min A OT Short Term Goal 3 (Week 2): Pt will tolerate 2 min of standing during functional activity in prep for more functional IADL particiaption   Skilled Therapeutic Interventions/Progress Updates:    Pt supine in bed with no c/o pain. Pt completed functional mobility around room with RW and completed item retrieval prior to b/d tasks with (S). Pt sat on [facility] and completed ostomy care with mod I. Pt required increased time and vc but was able to don/doff briefs sitting. Pt required only set up to complete bathing sitting EOB and (S) when standing. Pt completed functional mobility to rehab gym with RW and completed functional reaching distally in standing, requiring frequent rest breaks when removing BUE from [place] provided throughout as well as vc for body mechanics. Pt returned to room and was left supine in bed.   Therapy Documentation Precautions:  Precautions Precautions: Fall Precaution Comments: Colostomy  Restrictions Weight Bearing Restrictions: No Pain: Pain Assessment Pain Scale: 0-10 Pain Score: 0-No pain ADL: ADL ADL Comments: Please see functional navigator  See Function Navigator for Current Functional Status.   Therapy/Group: Individual Therapy  Curtis Sites 08/19/2018, 10:33 AM

## 2018-08-19 NOTE — Progress Notes (Signed)
Physical Therapy Session Note  Patient Details  Name: Melody Casey MRN: [id number] Date of Birth: 03-14-1946  Today's Date: 08/19/2018 PT Individual Time: 1300-1345 PT Individual Time Calculation (min): 45 min   Short Term Goals: Week 2:  PT Short Term Goal 1 (Week 2): pt will perform gait with min A x 15' in controlled environment PT Short Term Goal 2 (Week 2): pt will perform functional transfers consistently with min A  Skilled Therapeutic Interventions/Progress Updates:   PT and pt discussed pt d/c at end of week. During discussion, pt stated husband felt more confident with wound care at home than she did. Pt also mentioned family member that previously worked as a Quarry manager that could A with wound care after d/c. Pt completed supine to seated EOB transfer I. Pt performed sit to stand transfers x9 throughout session with RW and supervision. Pt amb with RW and supervision from room to PT gym 111ft and 52ft from gym to room at end of session. Pt completed B hip ab, hip extension, hip flexion, and knee flexion exercises while standing at parallel bars and supervision. Pt performed side stepping x8 bilaterally alternating LE at parallel bars with CG. Pt completed balance exercises to reach and place 8 objects while standing on airex foam x2 in parallel bars and with CGA. Pt amb w/ RW through 63ft obstacle course with 4 cones 34ft apart x2 with supervision. Pt and PT discussed ways to protect pt wound from dogs upon d/c to home. Pt continues to increase endurance and gait during PT with rest breaks PRN. Pt left in bed w/ all needs in reach.   Therapy Documentation Precautions:  Precautions Precautions: Fall Precaution Comments: Colostomy  Restrictions Weight Bearing Restrictions: No   Pain:  Pt did not report any pain.    See Function Navigator for Current Functional Status.   Therapy/Group: Individual Therapy  Jacqulyn Liner, SPT 08/19/2018, 1:56 PM

## 2018-08-19 NOTE — Progress Notes (Signed)
Physical Therapy Session Note  Patient Details  Name: Melody Casey MRN: [id number] Date of Birth: 1946-03-26  Today's Date: 08/19/2018 PT Individual Time: 1445-1532 PT Individual Time Calculation (min): 47 min   Short Term Goals: Week 1:  PT Short Term Goal 1 (Week 1): pt will perform functional transfers with max A PT Short Term Goal 1 - Progress (Week 1): Met PT Short Term Goal 2 (Week 1): pt will perform gait 10' in controlled environment with mod A PT Short Term Goal 2 - Progress (Week 1): Met  Skilled Therapeutic Interventions/Progress Updates:    Patient received sleeping in bed but easily woken, willing to participate in PT session. She is able to complete all functional bed mobility, transfers with RW, and gait approximately 164f with RW with S and VC for safety. Worked on functional balance strategies and activities, strengthening for B LEs and hip musculature, and B LE stretching today with multiple extended rest breaks provided due to high levels of fatigue this afternoon. Finished session on [place] on level 4, B UEs/LEs, for 6 minutes. Patient able to toilet with distant S at [place] She was left sitting at EOB with RN present and attending, all needs otherwise met.   Therapy Documentation Precautions:  Precautions Precautions: Fall Precaution Comments: Colostomy  Restrictions Weight Bearing Restrictions: No General:   Vital Signs:  Pain: Pain Assessment Pain Scale: 0-10 Pain Score: 0-No pain Faces Pain Scale: No hurt   See Function Navigator for Current Functional Status.   Therapy/Group: Individual Therapy  KDeniece ReePT, DPT, CBIS  Supplemental Physical Therapist [facility]   Pager [phone] Rehab Office [phone]  08/19/2018, 3:46 PM

## 2018-08-19 NOTE — Progress Notes (Signed)
Social Work  Keerthana Vanrossum, Eliezer Champagne  Social Worker  Physical Medicine and Rehabilitation  Patient Care Conference  Cosign Needed  Date of Service:  08/19/2018  3:26 PM          Cosign Needed        Show:Clear all [x] Manual[x] Template[] Copied  Added by: [x] Taevyn Hausen, Gardiner Rhyme, LCSW  [] Hover for details Inpatient RehabilitationTeam Conference and Plan of Care Update Date: 08/19/2018   Time: 2:15 PM      Patient Name: Melody Casey      Medical Record Number: [id number]  Date of Birth: [birth year] Sex: Female         Room/Bed: 4W12C/4W12C-01 Payor Info: Payor: MEDICARE / Plan: MEDICARE PART A AND B / Product Type: *No Product type* /     Admitting Diagnosis: Bowel perforation  Admit Date/Time:  08/06/2018  4:48 PM Admission Comments: No comment available    Primary Diagnosis:  <principal problem not specified> Principal Problem: <principal problem not specified>       Patient Active Problem List    Diagnosis Date Noted  . Benign essential HTN    . Chronic anticoagulation    . Hypokalemia    . Edema of left lower extremity    . Atrial fibrillation ([place])    . Debility 08/06/2018  . Acute blood loss anemia    . PAF (paroxysmal atrial fibrillation) ([place])    . Anxiety state    . Sepsis ([place]) 07/24/2018  . Acute diverticulitis of intestine 07/24/2018  . Bowel perforation ([place]) 07/24/2018  . Hyponatremia 07/24/2018  . Osteoporosis 08/27/2017  . Low vitamin D level 06/04/2017  . Frequent UTI 10/25/2015  . Microcytic anemia 08/30/2015  . Diverticulosis 08/30/2015  . Hemangioma    . TIA (transient ischemic attack) 08/13/2015  . Atrial fibrillation with rapid ventricular response ([place])    . Rheumatoid arthritis ([place]) 06/20/2015  . AAA (abdominal aortic aneurysm) without rupture ([place]) 01/28/2015  . Persistent atrial fibrillation ([place]) 11/17/2014  . History of scleroderma 02/17/2014  . Malignant neoplasm of upper-outer quadrant of right breast in female, estrogen receptor  positive ([place]) 02/01/2014  . Anxiety and depression 12/07/2011  . High risk medication use 06/14/2011  . CAD (coronary artery disease)    . Type 2 diabetes mellitus with complication, without long-term current use of insulin ([place]) 06/07/2008  . HLD (hyperlipidemia) 07/22/2007  . Essential hypertension 07/22/2007      Expected Discharge Date: Expected Discharge Date: 08/23/18   Team Members Present: Physician leading conference: Dr. Alger Simons Social Worker Present: Ovidio Kin, LCSW Nurse Present: Frances Maywood, RN PT Present: Roderic Ovens, PT OT Present: Other (comment)(Sandra Davis-OT) SLP Present: Weston Anna, SLP PPS Coordinator present : Ileana Ladd, PT       Current Status/Progress Goal Weekly Team Focus  Medical     gradual progress. abdominal wound healing. pushing po  improve activity tolerance  nutrition, woud care   Bowel/Bladder     Continent bowel/bladder  Maintain bowel/bladder continence   Assist with toileting PRN    Swallow/Nutrition/ Hydration               ADL's     Pt completes UB with set up, CGA for LB dressing. More consistently performs transfers with CGA. Great improvement in therapy participation   supervision   functional mobility, LB ADLs   Mobility     supervision/min A transfers and gait  supervision overall  endurance, strength, family ed   Communication  Safety/Cognition/ Behavioral Observations             Pain     Pain managed effectively by tramadol   Maintain pain of <4  Manage pain PRN    Skin     Midline abdominal incision wet-to-dry dressing BID. Packing to 3 abdominal incisions BID. L ostomy  Maintain skin integrity and promote healing   Reinforce importance of wound care to pt. Perform dressing changes according to prescription.      *See Care Plan and progress notes for long and short-term goals.      Barriers to Discharge   Current Status/Progress Possible Resolutions Date Resolved   Physician       Medical stability        education of pt/family re: ostomy/wound mgt      Nursing                 PT                    OT                 SLP            SW              Discharge Planning/Teaching Needs:  Home with husband who can provide 24 hr care, will come in prior to discharge and learn pt's care.      Team Discussion:  Progressing toward her goals of supervision level. Has made great strides in therapies this week and can move up discharge date. Vac removed and now doing wet to dry dressing. Will need education on wet to dry dressings and ostomy care. Nursing beginning education with pt, husband and friend-RN.  Revisions to Treatment Plan:  DC 9/14    Continued Need for Acute Rehabilitation Level of Care: The patient requires daily medical management by a physician with specialized training in physical medicine and rehabilitation for the following conditions: Daily direction of a multidisciplinary physical rehabilitation program to ensure safe treatment while eliciting the highest outcome that is of practical value to the patient.: Yes Daily medical management of patient stability for increased activity during participation in an intensive rehabilitation regime.: Yes Daily analysis of laboratory values and/or radiology reports with any subsequent need for medication adjustment of medical intervention for : Wound care problems;Other     I attest that I was present, lead the team conference, and concur with the assessment and plan of the team.     Elease Hashimoto 08/19/2018, 3:26 PM               Patient ID: Melody Casey, female   DOB: 06/21/1946, 72 y.o.   MRN: [id number]

## 2018-08-20 ENCOUNTER — Inpatient Hospital Stay (HOSPITAL_COMMUNITY): Payer: Medicare Other

## 2018-08-20 ENCOUNTER — Encounter (HOSPITAL_COMMUNITY): Payer: Medicare Other | Admitting: Psychology

## 2018-08-20 ENCOUNTER — Inpatient Hospital Stay (HOSPITAL_COMMUNITY): Payer: Medicare Other | Admitting: Occupational Therapy

## 2018-08-20 LAB — BASIC METABOLIC PANEL
Anion gap: 8 (ref 5–15)
BUN: 13 mg/dL (ref 8–23)
CALCIUM: 9.2 mg/dL (ref 8.9–10.3)
CO2: 19 mmol/L — ABNORMAL LOW (ref 22–32)
Chloride: 106 mmol/L (ref 98–111)
Creatinine, Ser: 0.57 mg/dL (ref 0.44–1.00)
GFR calc Af Amer: >60 mL/min (ref >60)
GFR calc non Af Amer: >60 mL/min (ref >60)
GLUCOSE: 95 mg/dL (ref 70–99)
Potassium: 4.5 mmol/L (ref 3.5–5.1)
SODIUM: 133 mmol/L — AB (ref 135–145)

## 2018-08-20 LAB — MAGNESIUM: MAGNESIUM: 2 mg/dL (ref 1.7–2.4)

## 2018-08-20 LAB — PROTIME-INR
INR: 2.18
Prothrombin Time: 24.1 seconds — ABNORMAL HIGH (ref 11.4–15.2)

## 2018-08-20 NOTE — Consult Note (Signed)
Neuropsychological Consultation   Patient:   Melody Casey   DOB:   [birth year]  MR Number:  [id number]  Location:  [place] [address] [place] [id number] [place] [address] Dept: [place]: [phone]           Date of Service:   08/20/2018  Start Time:   1 PM End Time:   2 PM  Provider/Observer:  Ilean Skill, Psy.D.       Clinical Neuropsychologist       Billing Code/Service: [phone] Units  Chief Complaint:    Melody Casey is a 72 year old female with history of hematoid arthritis, CAD with non-STEMI, atrial fibrillation on chronic Coumadin as well as Tikosyn, infrarenal AAA, history of breast cancer as well as frequent recurrent sigmoid diverticulitis.  Patient has had prior history of anxiety.  Presented 07/24/2018 with persist abdominal pain over prior 5 days in the left lower quadrant.  CT scan showed extensive sigmoid diverticulitis with evidence of perforation containing air and stool  Underwent left colectomy, end transverse colostomy.  Patient referred for comprehensive rehab program due to debility.    Reason for Service:  Patient was referred for neuropsychological consultation due to coping and adjustment issues following GI surgery and the development of Debility.  Below is the HPI for the current admission.  HPI: Melody Casey is a 72 year old right-handed female with history of rheumatoid arthritis, CAD with non-STEMI, atrial fibrillation on chronic Coumadin as well as Tikosyn, infrarenal AAA, history of breast cancer as well as frequent recurrent sigmoid diverticulitis.  Per chart review and patient, patient lives with spouse.  Reported to be independent and active prior to admission.  One level home with 3 steps to entry.  Presented 07/24/2018 with persistent abdominal pain x5 days left lower quadrant.  CT scan showed extensive sigmoid diverticulitis with evidence of perforation  containing air and stool.  She has small focus of pneumoperitoneum.  Noted INR upon admission of 3 and was reversed.  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 Donne Hazel.  Hospital course pain management.  Follow-up cardiology services for atrial fibrillation with RVR, she did require pressors.  Her chronic Coumadin has since been resumed.  Lower extremity Dopplers negative.  Acute blood loss anemia 8.1 and monitored.  Hospital course with ileus diet has slowly been advanced.  Therapy evaluations completed with recommendations of physical medicine rehab consult.  Patient was admitted for a comprehensive rehab program  Current Status:  Patient's mental status was good today with orientation x4.  She reported initial stress about recovery and need for husband to help her and worries about long term restrictions.  The patient reprots that this worry has decreased with knowing discharge date and husband coming in today learning to pack in wound.  She is feeling stronger with OT/PT and mood has improved.    Behavioral Observation: Melody Casey  presents as a 72 y.o.-year-old Right Caucasian Female who appeared her stated age. her dress was Appropriate and she was Well Groomed and her manners were Appropriate to the situation.  her participation was indicative of Appropriate and Attentive behaviors.  There were any physical disabilities noted.  she displayed an appropriate level of cooperation and motivation.     Interactions:    Active Appropriate and Attentive  Attention:   within normal limits and attention span and concentration were age appropriate  Memory:   within normal limits;  recent and remote memory intact  Visuo-spatial:  not examined  Speech (Volume):  low  Speech:   normal; normal  Thought Process:  Coherent  Though Content:  WNL; not suicidal and not homicidal  Orientation:   person, place, time/date and  situation  Judgment:   Good  Planning:   Good  Affect:    Appropriate  Mood:    Dysphoric  Insight:   Good  Intelligence:   normal   Medical History:   Past Medical History:  Diagnosis Date  . AAA (abdominal aortic aneurysm) ([place])   . Acute diverticulitis   . Anxiety   . Arthritis   . Atrial fibrillation ([place])    a. Dx 03/2011 - on tikosyn/coumadin.  [place] CAD (coronary artery disease)    a. NSTEMI 02/2011: occ mid Cx, DES to OM2, residual nonobst LAD dz.  . Cancer ([place])    breast  . Diabetes mellitus   . Diverticulitis   . Diverticulitis of colon     2008, 04/2011, 12/2014, 08/2015  . 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 Hemangioma    liver  . HLD (hyperlipidemia)   . HTN (hypertension)   . Osteoporosis   . Tobacco abuse    stopped smoking 2012  . Ureteral obstruction    History of gross hematuria/right hydronephrosis 2/2 to uteropelvic junction obstruction, s/p cystoscopy in January 2007 with bilateral retrograde pyelography, right ureter arthroscopy, right ureteral stent placement, bladder biopsies, stent removal since then.  . Wears dentures    top     Psychiatric History:  Patient does have prior history of anxiety and current medical issues exacerbated these symptoms initially.  Patient reports that anxiety has improved as medical status has improved.    Family Med/Psych History:  Family History  Problem Relation Age of Onset  . Lung cancer Father 39       deceased  . Cancer Father   . Heart failure Mother 86       deceased  . Heart disease Mother   . Diabetes Brother   . Hypertension Brother   . Cancer Brother        prostate  . Colon cancer Neg Hx   . Liver disease Neg Hx     Risk of Suicide/Violence: low Patient denies SI or HI.  Impression/DX:  Melody Casey is a 72 year old female with history of hematoid arthritis, CAD with non-STEMI, atrial fibrillation on chronic Coumadin as well as Tikosyn, infrarenal AAA, history of breast cancer as  well as frequent recurrent sigmoid diverticulitis.  Patient has had prior history of anxiety.  Presented 07/24/2018 with persist abdominal pain over prior 5 days in the left lower quadrant.  CT scan showed extensive sigmoid diverticulitis with evidence of perforation containing air and stool  Underwent left colectomy, end transverse colostomy.  Patient referred for comprehensive rehab program due to debility.    Patient's mental status was good today with orientation x4.  She reported initial stress about recovery and need for husband to help her and worries about long term restrictions.  The patient reprots that this worry has decreased with knowing discharge date and husband coming in today learning to pack in wound.  She is feeling stronger with OT/PT and mood has improved.     Diagnosis:    Debility        Electronically Signed   _______________________ Ilean Skill, Psy.D.

## 2018-08-20 NOTE — Progress Notes (Signed)
Occupational Therapy Session Note  Patient Details  Name: Melody Casey MRN: [id number] Date of Birth: [birth year]  Today's Date: 08/20/2018 OT Individual Time: 1430-1510 OT Individual Time Calculation (min): 40 min    Short Term Goals: Week 2:  OT Short Term Goal 1 (Week 2): Pt will transfer to [facility] with min A OT Short Term Goal 2 (Week 2): Pt will don pants with min A OT Short Term Goal 3 (Week 2): Pt will tolerate 2 min of standing during functional activity in prep for more functional IADL particiaption   Skilled Therapeutic Interventions/Progress Updates:    Pt sitting up in bed agreeable to therapy with no c/o pain. Pt completed 100 ft of functional mobility in hallway with RW, with a focus on self identification of needing rest break and building endurance. Pt sat edge of mat and participated in throwing/catching task to challenge dynamic reaching and sitting balance. Pt returned to room and transferred to [facility] over toilet where she performed ostomy care with set up. Pt was left supine with bed alarm set and all needs met.   Therapy Documentation Precautions:  Precautions Precautions: Fall Precaution Comments: Colostomy  Restrictions Weight Bearing Restrictions: No   Vital Signs: Therapy Vitals Temp: 98.1 F (36.7 C) Temp Source: Oral Pulse Rate: 72 Resp: 16 BP: (!) 104/57 Patient Position (if appropriate): Lying Oxygen Therapy SpO2: 100 % O2 Device: Room Air Pain: Pain Assessment Pain Scale: 0-10 Pain Score: 0-No pain ADL: ADL ADL Comments: Please see functional navigator  See Function Navigator for Current Functional Status.   Therapy/Group: Individual Therapy  Curtis Sites 08/20/2018, 3:18 PM

## 2018-08-20 NOTE — Progress Notes (Signed)
Physical Therapy Session Note  Patient Details  Name: Melody Casey MRN: [id number] Date of Birth: 07-Mar-1946  Today's Date: 08/20/2018 PT Individual Time: 1100-1200 PT Individual Time Calculation (min): 60 min   Short Term Goals: Week 1:  PT Short Term Goal 1 (Week 1): pt will perform functional transfers with max A PT Short Term Goal 1 - Progress (Week 1): Met PT Short Term Goal 2 (Week 1): pt will perform gait 10' in controlled environment with mod A PT Short Term Goal 2 - Progress (Week 1): Met Week 2:  PT Short Term Goal 1 (Week 2): pt will perform gait with min A x 15' in controlled environment PT Short Term Goal 2 (Week 2): pt will perform functional transfers consistently with min A  Skilled Therapeutic Interventions/Progress Updates:   Standing balance activity, reaching to knee level with R hand, then matching cards to board in front of her , crossing midline; seated rest break, then repeated using L hand.  Pt reports that L lower leg muscles fatigue, requiring seated rest break.   Gait training with RW on level tile x 130' , x 25' x 2 throughout session, with multiple turns, supervision> min guard assist and min cues for safe positioning of the RW during turns.  Up/down (4) steps 6" high, bil rails, self-selected step through when ascending, step through when descending, min guard assist.    Endurance activity, tapping beach ball in sitting, using 2# weighted bar, x 3 minutes to fatigue.  In ADL kitchen, pt stood x 3 minutes with 1UE support on counter, to remove items from 3 levels of upper cabinet and place on counter; seated rest, then place items back on shelves, reorganizing them as well. Pt required min cues to avoid holding her breath, and rest in standing when possible, breathing deeply a few times. Pt propelled w/c with bil LEs on level tile, x 50' x 2, requiring rest break between bouts.  Pt stated that she needed to change her Depends brief.  Toilet transfer for continent  voiding; brief wet.  Pt managed clothes with min assist when donning clean brief.  Hand washing at w/c level with superviison.  Pt left resting sitting on L side of bed, set up for lunch, with all needs at hand.    Therapy Documentation Precautions:  Precautions Precautions: Fall Precaution Comments: Colostomy  Restrictions Weight Bearing Restrictions: No   Pain: Pain Assessment Pain Scale: 0-10 Pain Score: 0-No pain    See Function Navigator for Current Functional Status.   Therapy/Group: Individual Therapy  [name] 08/20/2018, 12:13 PM

## 2018-08-20 NOTE — Progress Notes (Signed)
[facility] for Heparin >> warfarin Indication: atrial fibrillation  Allergies  Allergen Reactions  . Miralax [Polyethylene Glycol] Swelling and Rash    Took [facility] brand developed rash. Patient states she tolerated name brand MiraLax in the past.  09/01/15. Patient states she had diffuse swelling including swelling of her lips.   . Vancomycin Rash and Shortness Of Breath  . Acetaminophen Hives  . Oxycodone-Acetaminophen Itching  . Sulfa Antibiotics Rash    All over rash  . Sulfacetamide Sodium Rash    All over rash  . Sulfasalazine Rash    All over rash All over rash  . Banana Other (See Comments)    Unknown  . Latex Rash  . Tape Rash   Patient Measurements: Height: 5\' 8"  (172.7 cm) Weight: 198 lb 6.6 oz (90 kg) IBW/kg (Calculated) : 63.9 Heparin Dosing Weight: 76 kg  Vital Signs: Temp: 98.2 F (36.8 C) (09/11 0421) Temp Source: Oral (09/11 0421) BP: 136/64 (09/11 0900) Pulse Rate: 100 (09/11 0421)  Labs: Recent Labs    08/18/18 0532 08/19/18 0730 08/20/18 0455  LABPROT 25.8*  --  24.1*  INR 2.38  --  2.18  CREATININE 0.51 0.52 0.57   Estimated Creatinine Clearance: 72.4 mL/min (by C-G formula based on SCr of 0.57 mg/dL).  Assessment: 17 yoF on Coumadin 5mg  for Afib PTA. Heparin bridge given pt in Afib RVR and CHADsVAsc 7. INR continues to be therapeutic at 2.18. Hgb stable low at 8.7, plts wnl. No signs of bleeding noted.   Mag and k+ therapeutic on Tikosyn  Goal of Therapy:  INR 2-3 Heparin level 0.3-0.5 (lower goal) Monitor platelets by anticoagulation protocol: Yes   Plan:  Continue with PTA Warfarin 5 mg qday INR checks MWF  Onnie Boer, PharmD, BCIDP, AAHIVP, CPP Infectious Disease Pharmacist Pager: [phone] 08/20/2018 9:33 AM

## 2018-08-20 NOTE — Progress Notes (Signed)
[place] PHYSICAL MEDICINE & REHABILITATION     PROGRESS NOTE    Subjective/Complaints: Improving activity tolerance. Pleased with progress.   ROS: Patient denies fever, rash, sore throat, blurred vision, nausea, vomiting, diarrhea, cough, shortness of breath or chest pain, joint or back pain, headache, or mood change.   Objective:  No results found. No results for input(s): WBC, HGB, HCT, PLT in the last 72 hours. Recent Labs    08/19/18 0730 08/20/18 0455  NA 133* 133*  K 4.1 4.5  CL 103 106  GLUCOSE 101* 95  BUN 9 13  CREATININE 0.52 0.57  CALCIUM 9.3 9.2   CBG (last 3)  No results for input(s): GLUCAP in the last 72 hours.  Wt Readings from Last 3 Encounters:  08/20/18 90 kg  08/04/18 75.3 kg  07/22/18 75.8 kg     Intake/Output Summary (Last 24 hours) at 08/20/2018 0908 Last data filed at 08/19/2018 1700 Gross per 24 hour  Intake 480 ml  Output -  Net 480 ml    Vital Signs: Blood pressure 125/66, pulse 100, temperature 98.2 F (36.8 C), temperature source Oral, resp. rate 18, height 5\' 8"  (1.727 m), weight 90 kg, SpO2 95 %. Physical Exam:  Constitutional: No distress . Vital signs reviewed. HEENT: EOMI, oral membranes moist Neck: supple Cardiovascular: RRR without murmur. No JVD    Respiratory: CTA Bilaterally without wheezes or rales. Normal effort    GI: BS +, sl-tender, non-distended  Musculoskeletal:  Left lower extremity edema 1+ Right foot atrophy with contracture Neurological: She is alert and oriented Motor: Bilateral upper: 4/5 proximal distal Right lower extremity: Hip flexion, knee extension 4-/5, ankle contracture sl present, improving Left lower extremity: Hip flexion, knee extension 3/5, ankle 3/5, improving  Skin: skin warm/dry, abd wound pink/clean granulation, ostomy sealed Psychiatric: pleasant,more animated  Assessment/Plan: 1. Functional deficits secondary to debility after colectomy which require 3+ hours per day of  interdisciplinary therapy in a comprehensive inpatient rehab setting. Physiatrist is providing close team supervision and 24 hour management of active medical problems listed below. Physiatrist and rehab team continue to assess barriers to discharge/monitor patient progress toward functional and medical goals.  Function:  Bathing Bathing position Bathing activity did not occur: Refused Position: Other (comment)(on BSC)  Bathing parts Body parts bathed by patient: Right arm, Left arm, Chest, Right upper leg, Left upper leg, Front perineal area, Left lower leg, Abdomen, Right lower leg, Buttocks, Back Body parts bathed by helper: Right lower leg, Left lower leg, Back, Buttocks  Bathing assist Assist Level: Supervision or verbal cues      Upper Body Dressing/Undressing Upper body dressing Upper body dressing/undressing activity did not occur: Refused What is the patient wearing?: Pull over shirt/dress     Pull over shirt/dress - Perfomed by patient: Thread/unthread right sleeve, Put head through opening, Thread/unthread left sleeve, Pull shirt over trunk Pull over shirt/dress - Perfomed by helper: Pull shirt over trunk        Upper body assist Assist Level: Set up   Set up : To obtain clothing/put away  Lower Body Dressing/Undressing Lower body dressing Lower body dressing/undressing activity did not occur: Refused What is the patient wearing?: Underwear, Pants Underwear - Performed by patient: Thread/unthread right underwear leg, Pull underwear up/down, Thread/unthread left underwear leg Underwear - Performed by helper: Thread/unthread right underwear leg, Thread/unthread left underwear leg, Pull underwear up/down Pants- Performed by patient: Thread/unthread right pants leg, Thread/unthread left pants leg, Pull pants up/down Pants- Performed by helper:  Thread/unthread right pants leg, Thread/unthread left pants leg, Pull pants up/down   Non-skid slipper socks- Performed by helper:  Don/doff left sock, Don/doff right sock       Shoes - Performed by helper: Don/doff right shoe, Don/doff left shoe       TED Hose - Performed by helper: Don/doff left TED hose  Lower body assist Assist for lower body dressing: Supervision or verbal cues      Toileting Toileting   Toileting steps completed by patient: Adjust clothing after toileting, Adjust clothing prior to toileting, Performs perineal hygiene Toileting steps completed by helper: Adjust clothing prior to toileting, Performs perineal hygiene, Adjust clothing after toileting Toileting Assistive Devices: Grab bar or rail  Toileting assist Assist level: Touching or steadying assistance (Pt.75%)   Transfers Chair/bed transfer   Chair/bed transfer method: Ambulatory Chair/bed transfer assist level: Supervision or verbal cues Chair/bed transfer assistive device: Armrests, Environmental manager lift: Ecologist     Max distance: 120 Assist level: Supervision or verbal cues   Wheelchair   Type: Manual Max wheelchair distance: 90 Assist Level: Supervision or verbal cues  Cognition Comprehension Comprehension assist level: Follows complex conversation/direction with no assist  Expression Expression assist level: Expresses complex ideas: With no assist  Social Interaction Social Interaction assist level: Interacts appropriately 90% of the time - Needs monitoring or encouragement for participation or interaction.  Problem Solving Problem solving assist level: Solves basic problems with no assist  Memory/ Memory assist level: Recognizes or recalls 90% of the time/requires cueing < 10% of the time   Medical Problem List and Plan: 1.  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  DVT Prophylaxis/Anticoagulation: Heparin bridged to chronic Coumadin resumed  for history of atrial fibrillation  -INR 2.18 3. Pain Management: Ultram as needed 4. Mood: Xanax 0.25 mg 3 times daily as needed  -Neuropsych consult is requested  -pt's mood has become more positive 5. Neuropsych: This patient is capable of making decisions on her own behalf.  -team to provide ego support as needed 6. Skin/Wound Care:    -BID w-d dressings per surgery 7. Fluids/Electrolytes/Nutrition: encourae PO 8.  Acute blood loss anemia.    hgb 8.7 on 9/5 9.  Atrial fibrillation.  Cardizem 360 mg daily, Tikosyn 250 mg twice daily.  Cardiac rate controlled  -explained that she is now on cardizem per cardiology recs d/t RVR while on acute sie of hospital 10.  Hypertension.  Norvasc 2.5 mg daily  Controlled on 9/11 11.  Rheumatoid arthritis.  Plaquenil 200 mg twice daily 12.  Hyperlipidemia.  Lipitor 13.  Left lower extremity edema: Dopplers from last week were negative.  She has no pain no limb.    Appears sl improved    Cont with TED stockings,  ACE wrap if needed 14. Hyponatremia  Sodium 133 on 9/9  Cont to monitor 15. Hypokalemia  Potassium 4.1 9/10  Cont daily potassium to 40 mEq twice daily  Mag 2.0 on 9/10--maintain magox TID    LOS (Days) [place] EVALUATION WAS PERFORMED  [name], MD 08/20/2018 9:08 AM

## 2018-08-20 NOTE — Progress Notes (Signed)
Occupational Therapy Session Note  Patient Details  Name: Melody Casey MRN: [id number] Date of Birth: [birth year]  Today's Date: 08/20/2018 OT Individual Time: 7253-6644 OT Individual Time Calculation (min): 40 min    Short Term Goals: Week 1:  OT Short Term Goal 1 (Week 1): Pt will complete toilet transfer with mod A of 1  OT Short Term Goal 1 - Progress (Week 1): Met OT Short Term Goal 2 (Week 1): Pt will perform 1 step of ostomy care with min A OT Short Term Goal 2 - Progress (Week 1): Met OT Short Term Goal 3 (Week 1): Pt will complete 1 step of LB dressing task.  OT Short Term Goal 3 - Progress (Week 1): Met OT Short Term Goal 4 (Week 1): Pt will tolerate 1 minute standing in stedy in preparation for BADL task OT Short Term Goal 4 - Progress (Week 1): Met Week 2:  OT Short Term Goal 1 (Week 2): Pt will transfer to [facility] with min A OT Short Term Goal 2 (Week 2): Pt will don pants with min A OT Short Term Goal 3 (Week 2): Pt will tolerate 2 min of standing during functional activity in prep for more functional IADL particiaption    Skilled Therapeutic Interventions/Progress Updates:    Pt received sitting EOB fully dressed and ready for the day.  She desired to work on exercises to build her activity tolerance. Pt ambulated with RW with close S to day room and transferred onto Nu step.  She worked on the [employer] for her arms and legs for 15 min.  Pt then worked on standing balance with RW and then ambulated back to room. Reviewed her discharge plans and strategies of using the bathroom mirror to assist her with ostomy bag changes once her standing balance becomes more stable.  Pt resting EOB with bed alarm on.  Therapy Documentation Precautions:  Precautions Precautions: Fall Precaution Comments: Colostomy  Restrictions Weight Bearing Restrictions: No    Vital Signs: Therapy Vitals BP: 136/64 Pain: Pain Assessment Pain Scale: 0-10 Pain Score: 0-No pain ADL: ADL ADL  Comments: Please see functional navigator  See Function Navigator for Current Functional Status.   Therapy/Group: Individual Therapy  Martice Doty 08/20/2018, 12:06 PM

## 2018-08-21 ENCOUNTER — Inpatient Hospital Stay (HOSPITAL_COMMUNITY): Payer: Medicare Other

## 2018-08-21 ENCOUNTER — Inpatient Hospital Stay (HOSPITAL_COMMUNITY): Payer: Medicare Other | Admitting: Physical Therapy

## 2018-08-21 LAB — BASIC METABOLIC PANEL
Anion gap: 5 (ref 5–15)
BUN: 13 mg/dL (ref 8–23)
CHLORIDE: 107 mmol/L (ref 98–111)
CO2: 23 mmol/L (ref 22–32)
CREATININE: 0.52 mg/dL (ref 0.44–1.00)
Calcium: 9.6 mg/dL (ref 8.9–10.3)
GFR calc Af Amer: >60 mL/min (ref >60)
GFR calc non Af Amer: >60 mL/min (ref >60)
GLUCOSE: 97 mg/dL (ref 70–99)
POTASSIUM: 4.6 mmol/L (ref 3.5–5.1)
Sodium: 135 mmol/L (ref 135–145)

## 2018-08-21 LAB — MAGNESIUM: Magnesium: 1.9 mg/dL (ref 1.7–2.4)

## 2018-08-21 NOTE — Progress Notes (Signed)
Occupational Therapy Session Note  Patient Details  Name: Melody Casey MRN: [id number] Date of Birth: 10/24/1946  Today's Date: 08/21/2018 OT Individual Time: 1300-1345 OT Individual Time Calculation (min): 45 min    Short Term Goals: Week 2:  OT Short Term Goal 1 (Week 2): Pt will transfer to [facility] with min A OT Short Term Goal 2 (Week 2): Pt will don pants with min A OT Short Term Goal 3 (Week 2): Pt will tolerate 2 min of standing during functional activity in prep for more functional IADL particiaption   Skilled Therapeutic Interventions/Progress Updates:    Pt received supine in bed agreeable to therapy with questions re ostomy care supplies. Care coordination with CSW and RN re obtaining these supplies for home. Pt transferred OOB and to standing with RW with (S). Pt completed 100 ft of functional mobility at (S) level requiring 1 seated rest break. Pt sat supported and completed B UE strengthening/conditioning circuit per pt request to carryover into IADLs. Pt held 3lb dowel and completed 2 sets of 10x shoulder flexion, shoulder abduction/add, elbow flex/ext, and scapular retraction. Vc provided for form correction and proper muscle activation. Pt reported no pain during session. Pt left sitting up in bed with nutrition staff present.   Therapy Documentation Precautions:  Precautions Precautions: Fall Precaution Comments: Colostomy  Restrictions Weight Bearing Restrictions: No Vital Signs: Therapy Vitals Temp: 98.1 F (36.7 C) Temp Source: Oral Pulse Rate: 84 Resp: 20 BP: (!) 104/58 Patient Position (if appropriate): Lying Oxygen Therapy SpO2: 97 % O2 Device: Room Air Pain: Pain Assessment Pain Scale: 0-10 Pain Score: 0-No pain ADL: ADL ADL Comments: Please see functional navigator  See Function Navigator for Current Functional Status.   Therapy/Group: Individual Therapy  Curtis Sites 08/21/2018, 4:54 PM

## 2018-08-21 NOTE — Progress Notes (Signed)
Physical Therapy Session Note  Patient Details  Name: Melody Casey MRN: [id number] Date of Birth: Apr 22, 1946  Today's Date: 08/21/2018 PT Individual Time: 0830-0910 PT Individual Time Calculation (min): 40 min   Short Term Goals: Week 2:  PT Short Term Goal 1 (Week 2): pt will perform gait with min A x 15' in controlled environment PT Short Term Goal 2 (Week 2): pt will perform functional transfers consistently with min A  Skilled Therapeutic Interventions/Progress Updates:    pt performs bed mobility and transfers all with supervision.  Gait 150' x 2 with supervision with RW.  Gait in room and restroom with distant supervision. Pt empties ostomy bag without assistance.  Standing therex 2 x 10 hip abd, ext, HS curl, mini squats. Pt given handout of HEP for standing therex to perform at home.  Pt missed final 20 minutes of session due to nursing providing caregiver education for wound care.  Therapy Documentation Precautions:  Precautions Precautions: Fall Precaution Comments: Colostomy  Restrictions Weight Bearing Restrictions: No General: PT Amount of Missed Time (min): 10 Minutes PT Missed Treatment Reason: Nursing care Pain:  no c/o pain   Therapy/Group: Individual Therapy  Akiyah Eppolito 08/21/2018, 9:09 AM

## 2018-08-21 NOTE — Progress Notes (Signed)
Occupational Therapy Discharge Summary  Patient Details  Name: Melody Casey MRN: [id number] Date of Birth: [birth year]   Patient has met 65 of 11 long term goals due to improved activity tolerance, improved balance, postural control, ability to compensate for deficits, improved awareness and improved coordination.  Patient to discharge at overall Supervision level.  Patient's care partner is independent to provide the necessary physical assistance at discharge.    Reasons goals not met: N/A  Recommendation:  Patient will benefit from ongoing skilled OT services in home health setting to continue to advance functional skills in the area of BADL and iADL.  Equipment: No equipment provided  Reasons for discharge: treatment goals met and discharge from hospital  Patient/family agrees with progress made and goals achieved: Yes  OT Discharge Precautions/Restrictions  Precautions Precautions: Fall Precaution Comments: Colostomy  Restrictions Weight Bearing Restrictions: No    Pain Pain Assessment Pain Scale: 0-10 Pain Score: 0-No pain ADL ADL ADL Comments: Please see functional navigator Vision Baseline Vision/History: Wears glasses Wears Glasses: Distance only Patient Visual Report: No change from baseline Vision Assessment?: No apparent visual deficits Perception  Perception: Within Functional Limits Praxis Praxis: Intact Cognition Overall Cognitive Status: Within Functional Limits for tasks assessed Arousal/Alertness: Awake/alert Orientation Level: Oriented X4 Memory: Appears intact Awareness: Appears intact Problem Solving: Appears intact Safety/Judgment: Appears intact Sensation Sensation Light Touch: Appears Intact Coordination Gross Motor Movements are Fluid and Coordinated: Yes Fine Motor Movements are Fluid and Coordinated: Yes Motor  Motor Motor - Discharge Observations: improvements in strength/endurance noted, continues to present with mild  weakness Mobility  Transfers Sit to Stand: Supervision/Verbal cueing Stand to Sit: Supervision/Verbal cueing  Trunk/Postural Assessment  Postural Control Postural Control: Deficits on evaluation Righting Reactions: improvements noted, continues to require supervision due to slight delay  Balance Balance Balance Assessed: Yes Static Sitting Balance Static Sitting - Balance Support: Feet supported Static Sitting - Level of Assistance: 6: Modified independent (Device/Increase time) Dynamic Sitting Balance Dynamic Sitting - Balance Support: During functional activity Dynamic Sitting - Level of Assistance: 5: Stand by assistance Static Standing Balance Static Standing - Level of Assistance: 5: Stand by assistance Extremity/Trunk Assessment RUE Assessment RUE Assessment: Exceptions to [facility] General Strength Comments: grossly 4-/5 LUE Assessment LUE Assessment: Exceptions to [facility] General Strength Comments: grossly 4-/5   See Function Navigator for Current Functional Status.  Raymondo Band 08/22/2018, 10:01 AM

## 2018-08-21 NOTE — Progress Notes (Signed)
CC: Bowel perforation  Subjective: Patient is is doing well with the rehab.  Her ostomy is working.  Is recently been changed she had a small leak and has some skin irritation around the ostomy site.  Her midline incision looks pretty much as it did on the visit Monday there is supposed to be cleaning and rubbing the lower portion with a slough to help slowly debride this.  She has some areas in the upper portion of the wound that is packed with dressing gauze that is slowly improving also.  Objective: Vital signs in last 24 hours: Temp:  [97 F (36.1 C)-98.5 F (36.9 C)] 98.1 F (36.7 C) (09/12 1411) Pulse Rate:  [84-98] 84 (09/12 1411) Resp:  [17-20] 20 (09/12 1411) BP: (104-119)/(58-64) 104/58 (09/12 1411) SpO2:  [95 %-97 %] 97 % (09/12 1411) Last BM Date: 08/20/18  Intake/Output from previous day: 09/11 0701 - 09/12 0700 In: 720 [P.O.:720] Out: -  Intake/Output this shift: No intake/output data recorded.  General appearance: alert, cooperative and no distress Resp: clear to auscultation bilaterally GI: Soft, still sore, she has some skin irritation where the tape is being applied.  She also had some around the ostomy which I can see is now covered with the ostomy dressing.  The midline incision has about 95% good pink tissue.  There is some small sites at the top portion of the wound that are about a centimeter in diameter to go down to the fascia.  Then the lower third of the wound has some areas of slough is noted in the last picture.  This is low leak cleaning up.  Lab Results:  No results for input(s): WBC, HGB, HCT, PLT in the last 72 hours.  BMET Recent Labs    08/20/18 0455 08/21/18 0606  NA 133* 135  K 4.5 4.6  CL 106 107  CO2 19* 23  GLUCOSE 95 97  BUN 13 13  CREATININE 0.57 0.52  CALCIUM 9.2 9.6   PT/INR Recent Labs    08/20/18 0455  LABPROT 24.1*  INR 2.18    No results for input(s): AST, ALT, ALKPHOS, BILITOT, PROT, ALBUMIN in the last 168  hours.   Lipase     Component Value Date/Time   LIPASE 24 07/24/2018 0944     Medications: . amLODipine  2.5 mg Oral Daily  . atorvastatin  40 mg Oral q1800  . diltiazem  360 mg Oral Daily  . dofetilide  250 mcg Oral BID  . famotidine  20 mg Oral BID  . feeding supplement (ENSURE ENLIVE)  237 mL Oral BID BM  . hydroxychloroquine  200 mg Oral BID  . lidocaine-EPINEPHrine  20 mL Intradermal Once  . magnesium oxide  400 mg Oral TID  . potassium chloride  40 mEq Oral BID  . warfarin  5 mg Oral q1800  . 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 Donne Hazel, 08/16 -bowel functionstable -soft diet, continueensure -VAC M/W/F with white foam in base - converted to wet to dry 08/15/18 - PT/OT, per CIR  OZH:YQMV, ensure VTE: SCD's, lovenox HQ:IONGE 08/15-08/21 Follow up:Dr. Donne Hazel   Plan:  Continue the mechanical wet-to-dry dressings.  Continue gently rubbing the slough on the lower portion of the wound and using dressing gauze to pack the smaller open sites and the top portion of the wound.  I am working to get her an appointment to follow-up with Dr. Donne Hazel in 2 to 3 weeks in the office.  Home health is being set up to help with both the ostomy care and her midline incision.  I placed wound dressing instructions in the discharge orders.      LOS: 15 days    Jodeci Rini 08/21/2018 [phone]

## 2018-08-21 NOTE — Progress Notes (Signed)
Physical Therapy Session Note  Patient Details  Name: Melody Casey MRN: [id number] Date of Birth: [birth year]  Today's Date: 08/21/2018 PT Individual Time: 1045-1125(nursing care/colostomy leak ) PT Individual Time Calculation (min): 40 min   Short Term Goals: Week 1:  PT Short Term Goal 1 (Week 1): pt will perform functional transfers with max A PT Short Term Goal 1 - Progress (Week 1): Met PT Short Term Goal 2 (Week 1): pt will perform gait 10' in controlled environment with mod A PT Short Term Goal 2 - Progress (Week 1): Met  Skilled Therapeutic Interventions/Progress Updates:    Patient received in bed, sleeping and reporting high levels of fatigue, reports "I am just tired today". Able to complete all functional bed mobility and functional transfers with RW and S; able to ambulate approximately 136f and rode Nustep for 10 minutes with B UEs/LEs on level 4, fatigued after this activity. She self-propelled WC another 536fand was able to participate in dynamic balance activities in front of [employer] with min guard and on and off of foam pad, limited by fatigue and required multiple rest breaks this afternoon. Trans[place] to urgency of need to toilet, and she was able to perform all toileting and self care based activities with distant S. While she was toileting noted small leak from colostomy bag. Patient left sitting up on toilet with RN present and attending.   Therapy Documentation Precautions:  Precautions Precautions: Fall Precaution Comments: Colostomy  Restrictions Weight Bearing Restrictions: No Pain: Pain Assessment Pain Scale: 0-10 Pain Score: 2  Faces Pain Scale: Hurts a little bit Pain Type: Surgical pain Pain Location: Abdomen(stomach ) Pain Orientation: Right Pain Descriptors / Indicators: Aching;Sore Pain Frequency: Rarely Pain Onset: On-going Patients Stated Pain Goal: 0 Pain Intervention(s): Ambulation/increased activity   See  Function Navigator for Current Functional Status.   Therapy/Group: Individual Therapy   KrDeniece ReeT, DPT, CBIS  Supplemental Physical Therapist [facility]  Pager [phone] [phone]  08/21/2018, 12:15 PM

## 2018-08-21 NOTE — Progress Notes (Signed)
[facility] PHYSICAL MEDICINE & REHABILITATION     PROGRESS NOTE    Subjective/Complaints: Continues to progress. Family ed today re: wound/ostomy care  ROS: Patient denies fever, rash, sore throat, blurred vision, nausea, vomiting, diarrhea, cough, shortness of breath or chest pain, joint or back pain, headache, or mood change.    Objective:  No results found. No results for input(s): WBC, HGB, HCT, PLT in the last 72 hours. Recent Labs    08/20/18 0455 08/21/18 0606  NA 133* 135  K 4.5 4.6  CL 106 107  GLUCOSE 95 97  BUN 13 13  CREATININE 0.57 0.52  CALCIUM 9.2 9.6   CBG (last 3)  No results for input(s): GLUCAP in the last 72 hours.  Wt Readings from Last 3 Encounters:  08/20/18 90 kg  08/04/18 75.3 kg  07/22/18 75.8 kg     Intake/Output Summary (Last 24 hours) at 08/21/2018 0851 Last data filed at 08/20/2018 1931 Gross per 24 hour  Intake 480 ml  Output -  Net 480 ml    Vital Signs: Blood pressure 113/63, pulse 89, temperature (!) 97 F (36.1 C), temperature source Oral, resp. rate 18, height 5\' 8"  (1.727 m), weight 90 kg, SpO2 95 %. Physical Exam:  Constitutional: No distress . Vital signs reviewed. HEENT: EOMI, oral membranes moist Neck: supple Cardiovascular: RRR without murmur. No JVD    Respiratory: CTA Bilaterally without wheezes or rales. Normal effort    GI: BS +, ostomy intact, dressing in place  Musculoskeletal:  Left lower extremity edema 1+ Right foot atrophy with contracture Neurological: She is alert and oriented Motor: Bilateral upper: 4/5 proximal distal Right lower extremity: Hip flexion, knee extension 4-/5, ankle contracture limits foot movement Left lower extremity: Hip flexion, knee extension 3+/5, ankle 3+/5, improving  Skin: skin warm/dry, abd wound pink/clean granulation, ostomy sealed Psychiatric: pleasant,more animated  Assessment/Plan: 1. Functional deficits secondary to debility after colectomy which require 3+ hours per  day of interdisciplinary therapy in a comprehensive inpatient rehab setting. Physiatrist is providing close team supervision and 24 hour management of active medical problems listed below. Physiatrist and rehab team continue to assess barriers to discharge/monitor patient progress toward functional and medical goals.  Function:  Bathing Bathing position Bathing activity did not occur: Refused Position: Other (comment)(on BSC)  Bathing parts Body parts bathed by patient: Right arm, Left arm, Chest, Right upper leg, Left upper leg, Front perineal area, Left lower leg, Abdomen, Right lower leg, Buttocks, Back Body parts bathed by helper: Right lower leg, Left lower leg, Back, Buttocks  Bathing assist Assist Level: Supervision or verbal cues      Upper Body Dressing/Undressing Upper body dressing Upper body dressing/undressing activity did not occur: Refused What is the patient wearing?: Pull over shirt/dress     Pull over shirt/dress - Perfomed by patient: Thread/unthread right sleeve, Put head through opening, Thread/unthread left sleeve, Pull shirt over trunk Pull over shirt/dress - Perfomed by helper: Pull shirt over trunk        Upper body assist Assist Level: Set up   Set up : To obtain clothing/put away  Lower Body Dressing/Undressing Lower body dressing Lower body dressing/undressing activity did not occur: Refused What is the patient wearing?: Underwear, Pants Underwear - Performed by patient: Thread/unthread right underwear leg, Pull underwear up/down, Thread/unthread left underwear leg Underwear - Performed by helper: Thread/unthread right underwear leg, Thread/unthread left underwear leg, Pull underwear up/down Pants- Performed by patient: Thread/unthread right pants leg, Thread/unthread left pants leg,  Pull pants up/down Pants- Performed by helper: Thread/unthread right pants leg, Thread/unthread left pants leg, Pull pants up/down   Non-skid slipper socks- Performed by  helper: Don/doff left sock, Don/doff right sock       Shoes - Performed by helper: Don/doff right shoe, Don/doff left shoe       TED Hose - Performed by helper: Don/doff left TED hose  Lower body assist Assist for lower body dressing: Supervision or verbal cues      Toileting Toileting   Toileting steps completed by patient: Adjust clothing after toileting, Adjust clothing prior to toileting, Performs perineal hygiene Toileting steps completed by helper: Adjust clothing prior to toileting, Performs perineal hygiene, Adjust clothing after toileting Toileting Assistive Devices: Grab bar or rail  Toileting assist Assist level: Touching or steadying assistance (Pt.75%)   Transfers Chair/bed transfer   Chair/bed transfer method: Ambulatory Chair/bed transfer assist level: Touching or steadying assistance (Pt > 75%) Chair/bed transfer assistive device: Armrests, Environmental manager lift: Ecologist     Max distance: 130 Assist level: Touching or steadying assistance (Pt > 75%)   Wheelchair   Type: Manual Max wheelchair distance: 50 Assist Level: Supervision or verbal cues  Cognition Comprehension Comprehension assist level: Understands complex 90% of the time/cues 10% of the time  Expression Expression assist level: Expresses complex 90% of the time/cues < 10% of the time  Social Interaction Social Interaction assist level: Interacts appropriately with others - No medications needed.  Problem Solving Problem solving assist level: Solves complex 90% of the time/cues < 10% of the time  Memory/ Memory assist level: Recognizes or recalls 90% of the time/requires cueing < 10% of the time   Medical Problem List and Plan: 1.  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  DVT Prophylaxis/Anticoagulation: Heparin bridged to chronic Coumadin resumed  for history of atrial fibrillation  -INR 2.18 9/11 3. Pain Management: Ultram as needed 4. Mood: Xanax 0.25 mg 3 times daily as needed  -Neuropsych consult is requested  -pt's mood has become more positive 5. Neuropsych: This patient is capable of making decisions on her own behalf.  -team to provide ego support as needed 6. Skin/Wound Care:    -BID w-d dressings per surgery  -family ed today 7. Fluids/Electrolytes/Nutrition: encourae PO 8.  Acute blood loss anemia.    hgb 8.7 on 9/5 9.  Atrial fibrillation.  Cardizem 360 mg daily, Tikosyn 250 mg twice daily.  Cardiac rate controlled  -explained that she is now on cardizem per cardiology recs d/t RVR while on acute sie of hospital 10.  Hypertension.  Norvasc 2.5 mg daily  Controlled on 9/12 11.  Rheumatoid arthritis.  Plaquenil 200 mg twice daily 12.  Hyperlipidemia.  Lipitor 13.  Left lower extremity edema: Dopplers from last week were negative.  She has no pain no limb.    Appears sl improved    Cont with TED stockings,  ACE wrap if needed 14. Hyponatremia  Sodium 135 on 9/12  Cont to monitor 15. Hypokalemia  Potassium 4.6 9/12  Cont daily potassium to 40 mEq twice daily---may be able to back down slightly, based on tomorrows lab  Mag  1.9 on 9/12--maintain magox TID    LOS (Days) [place] EVALUATION WAS PERFORMED  [name], MD 08/21/2018 8:51 AM

## 2018-08-22 ENCOUNTER — Inpatient Hospital Stay (HOSPITAL_COMMUNITY): Payer: Medicare Other | Admitting: Physical Therapy

## 2018-08-22 ENCOUNTER — Inpatient Hospital Stay (HOSPITAL_COMMUNITY): Payer: Medicare Other | Admitting: Occupational Therapy

## 2018-08-22 LAB — BASIC METABOLIC PANEL
Anion gap: 7 (ref 5–15)
BUN: 12 mg/dL (ref 8–23)
CHLORIDE: 106 mmol/L (ref 98–111)
CO2: 21 mmol/L — ABNORMAL LOW (ref 22–32)
CREATININE: 0.54 mg/dL (ref 0.44–1.00)
Calcium: 9.5 mg/dL (ref 8.9–10.3)
GFR calc Af Amer: >60 mL/min (ref >60)
GFR calc non Af Amer: >60 mL/min (ref >60)
Glucose, Bld: 106 mg/dL — ABNORMAL HIGH (ref 70–99)
Potassium: 4.2 mmol/L (ref 3.5–5.1)
Sodium: 134 mmol/L — ABNORMAL LOW (ref 135–145)

## 2018-08-22 LAB — PROTIME-INR
INR: 2.11
Prothrombin Time: 23.5 seconds — ABNORMAL HIGH (ref 11.4–15.2)

## 2018-08-22 LAB — MAGNESIUM: MAGNESIUM: 1.8 mg/dL (ref 1.7–2.4)

## 2018-08-22 MED ORDER — TRAMADOL HCL 50 MG PO TABS: 50.0000 mg | ORAL_TABLET | Freq: Four times a day (QID) | ORAL | 0 refills | Status: DC | PRN

## 2018-08-22 MED ORDER — FAMOTIDINE 20 MG PO TABS: 20.0000 mg | ORAL_TABLET | Freq: Two times a day (BID) | ORAL | 0 refills | Status: DC

## 2018-08-22 MED ORDER — WARFARIN SODIUM 5 MG PO TABS: 5.0000 mg | ORAL_TABLET | Freq: Every day | ORAL | 11 refills | Status: DC

## 2018-08-22 MED ORDER — ATORVASTATIN CALCIUM 40 MG PO TABS: 40.0000 mg | ORAL_TABLET | Freq: Every day | ORAL | 6 refills | Status: DC

## 2018-08-22 MED ORDER — ALPRAZOLAM 0.25 MG PO TABS: 0.2500 mg | ORAL_TABLET | Freq: Three times a day (TID) | ORAL | 0 refills | Status: DC | PRN

## 2018-08-22 MED ORDER — DOFETILIDE 250 MCG PO CAPS: 250.0000 ug | ORAL_CAPSULE | Freq: Two times a day (BID) | ORAL | 3 refills | Status: DC

## 2018-08-22 MED ORDER — MAGNESIUM OXIDE 400 (241.3 MG) MG PO TABS: 400.0000 mg | ORAL_TABLET | Freq: Three times a day (TID) | ORAL | 1 refills | Status: DC

## 2018-08-22 MED ORDER — POTASSIUM CHLORIDE CRYS ER 20 MEQ PO TBCR: 20.0000 meq | EXTENDED_RELEASE_TABLET | Freq: Every day | ORAL | 0 refills | Status: DC

## 2018-08-22 MED ORDER — WARFARIN SODIUM 5 MG PO TABS: 5.0000 mg | ORAL_TABLET | Freq: Every day | ORAL | Status: DC

## 2018-08-22 MED ORDER — HYDROXYCHLOROQUINE SULFATE 200 MG PO TABS: 200.0000 mg | ORAL_TABLET | Freq: Two times a day (BID) | ORAL | 0 refills | Status: DC

## 2018-08-22 MED ORDER — AMLODIPINE BESYLATE 2.5 MG PO TABS: 2.5000 mg | ORAL_TABLET | Freq: Every day | ORAL | 0 refills | Status: DC

## 2018-08-22 MED ORDER — DILTIAZEM HCL ER COATED BEADS 360 MG PO CP24: 360.0000 mg | ORAL_CAPSULE | Freq: Every day | ORAL | 0 refills | Status: DC

## 2018-08-22 NOTE — Discharge Summary (Signed)
Discharge summary job [id number]

## 2018-08-22 NOTE — Progress Notes (Signed)
Physical Therapy Session Note  Patient Details  Name: Melody Casey MRN: [id number] Date of Birth: 1946-04-05  Today's Date: 08/22/2018 PT Individual Time: 1500-1530 PT Individual Time Calculation (min): 30 min   Short Term Goals: Week 2:  PT Short Term Goal 1 (Week 2): pt will perform gait with min A x 15' in controlled environment PT Short Term Goal 2 (Week 2): pt will perform functional transfers consistently with min A  Skilled Therapeutic Interventions/Progress Updates:     Patient received in bed, very pleasant and willing to participate in PT today. Able to complete all functional bed mobility with no physical assistance, extended time. Focused on ambulation with RW and distant S for endurance training today with patient easily fatigued this afternoon. She continues to require VC for safety with functional transfers due to leaving her RW off to the side and sitting in uncontrolled manner into chair without having it fully behind her. Discussed safety with transfers and RW, WC in depth today with explanation for safety precautions and possible outcomes of not following safety training with transfers including having a fall at home. Also discussed use and demonstrated function of WC that she will be taking home with her. Otherwise worked on Hotel manager throwing basketball in tandem stance with Min guard and no UE support. Patient fatigued and was returned to her room totalA via [place], able to transfer back to bed with S and VC for locking WC prior to transfers. She was left sitting at the side of the bed with all needs met and questions/concerns addressed this afternoon.   Therapy Documentation Precautions:  Precautions Precautions: Fall Precaution Comments: Colostomy  Restrictions Weight Bearing Restrictions: No General:   Pain: Pain Assessment Pain Scale: 0-10 Pain Score: 0-No pain Faces Pain Scale: No hurt   See Function Navigator for Current Functional  Status.   Therapy/Group: Individual Therapy  Deniece Ree PT, DPT, CBIS  Supplemental Physical Therapist [facility]    Pager [phone] Acute Rehab Office [phone]   08/22/2018, 4:04 PM

## 2018-08-22 NOTE — Progress Notes (Signed)
Occupational Therapy Session Note  Patient Details  Name: Melody Casey MRN: [id number] Date of Birth: January 06, 1946  Today's Date: 08/22/2018 OT Individual Time: 4742-5956 OT Individual Time Calculation (min): 59 min    Short Term Goals: Week 2:  OT Short Term Goal 1 (Week 2): Pt will transfer to [facility] with min A OT Short Term Goal 2 (Week 2): Pt will don pants with min A OT Short Term Goal 3 (Week 2): Pt will tolerate 2 min of standing during functional activity in prep for more functional IADL particiaption   Skilled Therapeutic Interventions/Progress Updates:    Pt presents sitting EOB ready for OT tx session. Pt ambulates in room and throughout session using RW with overall supervision, intermittent verbal cues for maintaining wider BOS while taking steps. Pt completes bathing/dressing ADLs seated in w/c at sink, completing with supervision throughout. Pt ambulates to ADL apartment with 1 seated rest break. Practiced shower transfer to shower chair while in apartment as precursor for task completion once cleared to shower after discharge, pt perming with minguard for entering shower, supervision for exiting. Pt transitioned to dayroom in manner described above. When given the option pt selecting to participate in nu-step. Pt completing nu-step for approx 12 min without rest break. Returned to room in manner described above where pt was left seated EOB with PA present, bed alarm set and needs within reach.   Therapy Documentation Precautions:  Precautions Precautions: Fall Precaution Comments: Colostomy  Restrictions Weight Bearing Restrictions: No    Pain: Pain Assessment Pain Scale: 0-10 Pain Score: 0-No pain ADL: ADL ADL Comments: Please see functional navigator Vision Baseline Vision/History: Wears glasses Wears Glasses: Distance only Patient Visual Report: No change from baseline Vision Assessment?: No apparent visual deficits Perception  Perception: Within Functional  Limits Praxis Praxis: Intact Exercises:   Other Treatments:    See Function Navigator for Current Functional Status.   Therapy/Group: Individual Therapy  Raymondo Band 08/22/2018, 12:27 PM

## 2018-08-22 NOTE — Progress Notes (Signed)
[place] for Heparin >> warfarin Indication: atrial fibrillation  Allergies  Allergen Reactions  . Miralax [Polyethylene Glycol] Swelling and Rash    Took [facility] brand developed rash. Patient states she tolerated name brand MiraLax in the past.  09/01/15. Patient states she had diffuse swelling including swelling of her lips.   . Vancomycin Rash and Shortness Of Breath  . Acetaminophen Hives  . Oxycodone-Acetaminophen Itching  . Sulfa Antibiotics Rash    All over rash  . Sulfacetamide Sodium Rash    All over rash  . Sulfasalazine Rash    All over rash All over rash  . Banana Other (See Comments)    Unknown  . Latex Rash  . Tape Rash   Patient Measurements: Height: 5\' 8"  (172.7 cm) Weight: 198 lb 6.6 oz (90 kg) IBW/kg (Calculated) : 63.9 Heparin Dosing Weight: 76 kg  Vital Signs: Temp: 98 F (36.7 C) (09/13 0512) BP: 129/65 (09/13 0512) Pulse Rate: 90 (09/13 0512)  Labs: Recent Labs    08/20/18 0455 08/21/18 0606 08/22/18 0646  LABPROT 24.1*  --  23.5*  INR 2.18  --  2.11  CREATININE 0.57 0.52 0.54   Estimated Creatinine Clearance: 72.4 mL/min (by C-G formula based on SCr of 0.54 mg/dL).  Assessment: 49 yoF on Coumadin 5mg  for Afib PTA. Heparin bridge given pt in Afib RVR and CHADsVAsc 7. INR continues to be therapeutic at 2.11. Hgb stable low at 8.7, plts wnl. No signs of bleeding noted.   Mag and k+ monitoring on Tikosyn. Goal of k>4, mg>2  Goal of Therapy:  INR 2-3 Heparin level 0.3-0.5 (lower goal) Monitor platelets by anticoagulation protocol: Yes   Plan:  Continue with PTA Warfarin 5 mg qday INR checks MWF  Onnie Boer, PharmD, BCIDP, AAHIVP, CPP Infectious Disease Pharmacist Pager: [phone] 08/22/2018 9:41 AM

## 2018-08-22 NOTE — Progress Notes (Signed)
Social Work  Discharge Note  The overall goal for the admission was met for: DC-SAT 9/14  Discharge location: Yes-HOME WITH HUSBAND AND OTHERS WHO CAN CHECK ON WHILE HUSBAND WORKS  Length of Stay: Yes-17 DAYS  Discharge activity level: Yes-INDEPENDENT WITH ASSISTIVE DEVICE  Home/community participation: Yes  Services provided included: MD, RD, PT, OT, RN, CM, Pharmacy, Neuropsych and SW  Financial Services: Medicare  Follow-up services arranged: Home Health: [facility]-PT,OT,RN, DME: [facility]-TRANSPORT CHAIR and Patient/Family request agency [facility]: [facility], DME: [facility]  Comments (or additional information):PT DID VERY WELL HERE AND COULD MOVE UP [place]. PT AT MOD/I LEVEL.  Patient/Family verbalized understanding of follow-up arrangements: Yes  Individual responsible for coordination of the follow-up plan: SELF & JIM-HUSBAND  Confirmed correct DME delivered: Melody Casey 08/22/2018    Melody Casey

## 2018-08-22 NOTE — Progress Notes (Signed)
Physical Therapy Discharge Summary  Patient Details  Name: Melody Casey MRN: [id number] Date of Birth: October 28, 1946  Today's Date: 08/22/2018 PT Individual Time: 1045-1140 PT Individual Time Calculation (min): 55 min   Pt performs gait throughout the unit in home and controlled environments with RW and supervision.  Stair negotiation x 8 steps with bilat handrails with supervision.  Simulated car transfer to SUV height with supervision, cues for safety.  Ramp negotiatoin with RW and supervision.  Seated therex for improved LE strength 2 x 15 hip flex, knee flex, LAQ, hip abd/add all with 1.5# wt.  Pt performs bed mobility independently.  Pt left in bed with needs at hand. Pt states she feels ready for safe d/c home tomorrow.   Patient has met 9 of 9 long term goals due to improved activity tolerance, improved balance, increased strength, decreased pain and ability to compensate for deficits.  Patient to discharge at an ambulatory level Supervision.   Pt's husband not present for PT family education but pt is aware of recommendation for supervision with mobility.  Reasons goals not met: n/a  Recommendation:  Patient will benefit from ongoing skilled PT services in home health setting to continue to advance safe functional mobility, address ongoing impairments in strength, gait, balance, endurance, and minimize fall risk.  Equipment: transport w/c  Reasons for discharge: treatment goals met and discharge from hospital  Patient/family agrees with progress made and goals achieved: Yes  PT Discharge Precautions/Restrictions Precautions Precautions: Fall Precaution Comments: Colostomy  Restrictions Weight Bearing Restrictions: No Pain Pain Assessment Pain Scale: 0-10 Pain Score: 0-No pain  Cognition Overall Cognitive Status: Within Functional Limits for tasks assessed Arousal/Alertness: Awake/alert Orientation Level: Oriented X4 Memory: Appears intact Awareness: Appears  intact Problem Solving: Appears intact Safety/Judgment: Appears intact Sensation Sensation Light Touch: Appears Intact Proprioception: Appears Intact Coordination Gross Motor Movements are Fluid and Coordinated: Yes Fine Motor Movements are Fluid and Coordinated: Yes Motor  Motor Motor - Discharge Observations: improvements in strength/endurance noted, continues to present with mild weakness  Mobility Bed Mobility Rolling Right: Supervision/verbal cueing Rolling Left: Supervision/Verbal cueing Transfers Transfers: Sit to Stand;Stand to Sit;Stand Pivot Transfers Sit to Stand: Supervision/Verbal cueing Stand to Sit: Supervision/Verbal cueing Stand Pivot Transfers: Supervision/Verbal cueing Transfer (Assistive device): Rolling walker Locomotion  Gait Ambulation: Yes Gait Assistance: Supervision/Verbal cueing Gait Distance (Feet): 150 Feet Assistive device: Rolling walker Stairs / Additional Locomotion Stairs: Yes Stairs Assistance: Supervision/Verbal cueing Stair Management Technique: Two rails Number of Stairs: 8 Ramp: Supervision/Verbal cueing Wheelchair Mobility Wheelchair Mobility: Yes Wheelchair Assistance: Chartered loss adjuster: Both upper extremities  Trunk/Postural Assessment  Cervical Assessment Cervical Assessment: (fwd head) Thoracic Assessment Thoracic Assessment: (kyphosis) Lumbar Assessment Lumbar Assessment: (posterior pelvic tilt) Postural Control Postural Control: Deficits on evaluation Righting Reactions: improvements noted, continues to require supervision due to slight delay  Balance Balance Balance Assessed: Yes Static Sitting Balance Static Sitting - Balance Support: Feet supported Static Sitting - Level of Assistance: 6: Modified independent (Device/Increase time) Dynamic Sitting Balance Dynamic Sitting - Balance Support: During functional activity Dynamic Sitting - Level of Assistance: 5: Stand by  assistance Static Standing Balance Static Standing - Level of Assistance: 5: Stand by assistance Dynamic Standing Balance Dynamic Standing - Comments: supervision with RW Extremity Assessment      RLE Assessment General Strength Comments: foot deformity, grossly 4/5 LLE Assessment General Strength Comments: grossly 3/5, slightly limited by swelling   See Function Navigator for Current Functional Status.  DONAWERTH,KAREN 08/22/2018, 11:36 AM   [name]  Hanley Hays, DPT, CBIS  Supplemental Physical Therapist [facility]    Pager [phone] Acute Rehab Office [phone]

## 2018-08-22 NOTE — Discharge Summary (Signed)
NAME: Melody Casey, DOBESH MEDICAL RECORD [id number] ACCOUNT [id number] DATE OF BIRTH:[birth year] FACILITY: [facility] LOCATION: [facility] PHYSICIAN:ZACHARY SWARTZ, MD  DISCHARGE SUMMARY  DATE OF DISCHARGE:  08/23/2018  ADMIT DATE:  08/06/2018  DISCHARGE DATE:  08/23/2018  DISCHARGE DIAGNOSES: 1.  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 2.  Chronic Coumadin for atrial fibrillation. 3.  Pain management. 4.  Mood.  5.  Acute blood loss anemia. 6.  Atrial fibrillation. 7.  Hypertension. 8.  Rheumatoid arthritis. 9.  Hyperlipidemia. 10.  Left lower extremity edema. 11.  Hyponatremia resolved.  HISTORY OF PRESENT ILLNESS:  A 72 year old right-handed female with history of rheumatoid arthritis, CAD, atrial fibrillation on chronic Coumadin, infrarenal abdominal aortic aneurysm, history of breast cancer as well as frequent recurrent sigmoid  diverticulitis.  Per chart review lives with spouse, independent and active prior to admission.  Presented 07/24/2018 with persistent abdominal pain x5 days, left lower quadrant.  CT scan showed extensive sigmoid diverticulitis with evidence of  perforation containing air and stool.  Small focus of pneumoperitoneum.  Noted INR of 3 and was reversed.  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 Donne Hazel.  HOSPITAL COURSE:  Pain management.  Cardiology followup for atrial fibrillation.  She did require pressors.  Her chronic Coumadin had been resumed.  Lower extremity Dopplers negative.  Acute blood loss anemia 8.1.  HOSPITAL COURSE:  [place].  Diet slowly advanced.  Therapy evaluations completed, the patient was admitted for comprehensive rehabilitation program.  PAST MEDICAL HISTORY:  See discharge diagnoses.  SOCIAL HISTORY:  Lives with spouse, independent prior to admission.  FUNCTIONAL STATUS:  Upon admission to rehab services was max  assist sit to stand, max assist 8 feet rolling walker, mod/max assist with activities of daily living.  PHYSICAL EXAMINATION: VITAL SIGNS:  Blood pressure 119/55, pulse 86, temperature 97, respirations 17. GENERAL:  Alert female in no acute distress.  Oriented x3. HEENT:  EOMs intact. NECK:  Supple, nontender, no JVD. CARDIOVASCULAR:  Rate controlled. ABDOMEN:  Soft, nontender.  Colostomy in place.  Abdominal wound newly dressed.  REHABILITATION HOSPITAL COURSE:  The patient was admitted to inpatient rehabilitation services.  Therapies initiated on a 3-hour daily basis, consisting of physical therapy, occupational therapy and rehabilitation nursing.  The following issues were  addressed during patient's rehabilitation stay.  Pertaining to the patient's recurrent diverticulitis, she had undergone colectomy, colostomy followed by general surgery.  Dressing care as advised.  Chronic Coumadin therapy resume, no bleeding episodes.   She would follow up with [place] for ongoing Coumadin therapy.  Pain management with use of Ultram and well controlled.  Mood stabilization using Xanax on a limited basis.  Acute blood loss anemia, stable 8.7.  Cardiac rate remained  controlled and she continued on Cardizem as well as Tikosyn.  She will continue her Plaquenil for history of rheumatoid arthritis.  Lipitor ongoing for hyperlipidemia.  She had some left lower extremity edema.  Doppler is negative.  Support hose had been  applied.  Hyponatremia, resolving at 135.  The patient received weekly collaborative interdisciplinary team conferences to discuss estimated length of stay, family teaching, any barriers to discharge.  She was ambulating with a rolling walker,  supervision working with energy conservation techniques.  She was able to propel her wheelchair, perform all toileting and care in basic activities with distant supervision.  The patient overall very encouraged with progress with plan  discharge to home.  DISCHARGE  MEDICATIONS:  Included Norvasc 2.5 mg p.o. daily, Lipitor 40 mg p.o. daily, Cardizem 360 mg p.o. daily, Tikosyn 250 mcg p.o. b.i.d., Pepcid 20 mg p.o. b.i.d., Plaquenil 200 mg p.o. b.i.d., magnesium oxide 400 mg p.o. t.i.d., Coumadin latest  dose of 5 mg daily adjusted accordingly, Xanax 0.25 mg t.i.d. as needed, Ultram 50 mg every 6 hours as needed for pain.  DIET:  Her diet was a regular consistency.  FOLLOWUP:  The patient will follow up with Dr. Alger Simons at the outpatient rehab service office as directed; Dr. Rolm Bookbinder 08/28/2018; Dr. Minus Breeding, call for appointment; Dr. Vonna Kotyk Dettinger 08/28/2018.  A home health nurse had been  arranged to check INR on 08/25/2018 with results to [facility] Coumadin clinic [phone] fax [phone].  Wound care, wet to dry dressings to midline wound of abdomen twice daily gently rub the white non-pink areas with the wet 4 x 4 with each  dressing change.  TN/NUANCE D:08/22/2018 T:08/22/2018 JOB:[id number]

## 2018-08-22 NOTE — Plan of Care (Signed)
  Problem: RH BOWEL ELIMINATION Goal: RH STG MANAGE BOWEL WITH ASSISTANCE Description STG Manage Bowel with Assistance. Mod assist to manage ostomy  Outcome: Progressing   Problem: RH BLADDER ELIMINATION Goal: RH STG MANAGE BLADDER WITH ASSISTANCE Description STG Manage Bladder With Assistance. Min assist  Outcome: Progressing   Problem: RH SKIN INTEGRITY Goal: RH STG MAINTAIN SKIN INTEGRITY WITH ASSISTANCE Description STG Maintain Skin Integrity With Assistance. Mod assist  Outcome: Progressing Goal: RH STG ABLE TO PERFORM INCISION/WOUND CARE W/ASSISTANCE Description STG Able To Perform Incision/Wound Care With Assistance. Mod assist  Outcome: Progressing   Problem: RH PAIN MANAGEMENT Goal: RH STG PAIN MANAGED AT OR BELOW PT'S PAIN GOAL Description Less than 4  Outcome: Progressing   Problem: Education: Goal: Knowledge of ostomy care will improve Outcome: Progressing Goal: Understanding of discharge needs will improve Outcome: Progressing   Problem: Bowel/Gastric/Urinary: Goal: Gastrointestinal status for postoperative course will improve Outcome: Progressing   Problem: Consults Goal: RH GENERAL PATIENT EDUCATION Description See Patient Education module for education specifics. Outcome: Progressing

## 2018-08-22 NOTE — Progress Notes (Signed)
[facility]YS[facility] MEDICINE & REHABILITATION     PROGRESS NOTE    Subjective/Complaints: Pt up in bed. States that ed went well re: wound/ostomy yesterday. A little anxious about going home  ROS: Patient denies fever, rash, sore throat, blurred vision, nausea, vomiting, diarrhea, cough, shortness of breath or chest pain, joint or back pain, headache, or mood change.   Objective:  No results found. No results for input(s): WBC, HGB, HCT, PLT in the last 72 hours. Recent Labs    08/21/18 0606 08/22/18 0646  NA 135 134*  K 4.6 4.2  CL 107 106  GLUCOSE 97 106*  BUN 13 12  CREATININE 0.52 0.54  CALCIUM 9.6 9.5   CBG (last 3)  No results for input(s): GLUCAP in the last 72 hours.  Wt Readings from Last 3 Encounters:  08/20/18 90 kg  08/04/18 75.3 kg  07/22/18 75.8 kg     Intake/Output Summary (Last 24 hours) at 08/22/2018 0922 Last data filed at 08/22/2018 0745 Gross per 24 hour  Intake 840 ml  Output 100 ml  Net 740 ml    Vital Signs: Blood pressure 129/65, pulse 90, temperature 98 F (36.7 C), resp. rate 16, height 5\' 8"  (1.727 m), weight 90 kg, SpO2 95 %. Physical Exam:  Constitutional: No distress . Vital signs reviewed. HEENT: EOMI, oral membranes moist Neck: supple Cardiovascular: RRR without murmur. No JVD    Respiratory: CTA Bilaterally without wheezes or rales. Normal effort    GI: BS +, ostomy sealed, wound dressed (did not remove dressing)  Musculoskeletal:  Left lower extremity edema 1+ Right foot atrophy with contracture Neurological: She is alert and oriented Motor: Bilateral upper: 4+/5 proximal distal Right lower extremity: Hip flexion, knee extension 4/5, ankle contracture limits foot movement Left lower extremity: Hip flexion, knee extension 4-/5, ankle 4-/5, improving  Skin: skin warm/dry, abd wound pink/clean granulation, ostomy sealed Psychiatric: pleasant  Assessment/Plan: 1. Functional deficits secondary to debility after colectomy  which require 3+ hours per day of interdisciplinary therapy in a comprehensive inpatient rehab setting. Physiatrist is providing close team supervision and 24 hour management of active medical problems listed below. Physiatrist and rehab team continue to assess barriers to discharge/monitor patient progress toward functional and medical goals.  Function:  Bathing Bathing position Bathing activity did not occur: Refused Position: Wheelchair/chair at sink  Bathing parts Body parts bathed by patient: Right arm, Left arm, Chest, Right upper leg, Left upper leg, Front perineal area, Left lower leg, Abdomen, Right lower leg, Buttocks, Back Body parts bathed by helper: Right lower leg, Left lower leg, Back, Buttocks  Bathing assist Assist Level: Supervision or verbal cues      Upper Body Dressing/Undressing Upper body dressing Upper body dressing/undressing activity did not occur: Refused What is the patient wearing?: Pull over shirt/dress     Pull over shirt/dress - Perfomed by patient: Thread/unthread right sleeve, Put head through opening, Thread/unthread left sleeve, Pull shirt over trunk Pull over shirt/dress - Perfomed by helper: Pull shirt over trunk        Upper body assist Assist Level: More than reasonable time   Set up : To obtain clothing/put away  Lower Body Dressing/Undressing Lower body dressing Lower body dressing/undressing activity did not occur: Refused What is the patient wearing?: Pants Underwear - Performed by patient: Thread/unthread right underwear leg, Pull underwear up/down, Thread/unthread left underwear leg Underwear - Performed by helper: Thread/unthread right underwear leg, Thread/unthread left underwear leg, Pull underwear up/down Pants- Performed by patient:  Thread/unthread right pants leg, Thread/unthread left pants leg, Pull pants up/down Pants- Performed by helper: Thread/unthread right pants leg, Thread/unthread left pants leg, Pull pants up/down    Non-skid slipper socks- Performed by helper: Don/doff left sock, Don/doff right sock       Shoes - Performed by helper: Don/doff right shoe, Don/doff left shoe       TED Hose - Performed by helper: Don/doff left TED hose  Lower body assist Assist for lower body dressing: Supervision or verbal cues      Toileting Toileting   Toileting steps completed by patient: Adjust clothing after toileting, Adjust clothing prior to toileting, Performs perineal hygiene Toileting steps completed by helper: Adjust clothing prior to toileting, Performs perineal hygiene, Adjust clothing after toileting Toileting Assistive Devices: Grab bar or rail  Toileting assist Assist level: Touching or steadying assistance (Pt.75%)   Transfers Chair/bed transfer   Chair/bed transfer method: Ambulatory Chair/bed transfer assist level: Supervision or verbal cues Chair/bed transfer assistive device: Armrests, Environmental manager lift: Ecologist     Max distance: 100 Assist level: Supervision or verbal cues   Wheelchair   Type: Manual Max wheelchair distance: 50 Assist Level: Supervision or verbal cues  Cognition Comprehension Comprehension assist level: Understands basic 90% of the time/cues < 10% of the time  Expression Expression assist level: Expresses complex ideas: With no assist  Social Interaction Social Interaction assist level: Interacts appropriately with others - No medications needed.  Problem Solving Problem solving assist level: Solves basic problems with no assist  Memory/ Memory assist level: Recognizes or recalls 90% of the time/requires cueing < 10% of the time   Medical Problem List and Plan: 1.  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    -finalize  family education  2.  DVT Prophylaxis/Anticoagulation: Heparin bridged to chronic Coumadin resumed for history of atrial fibrillation  -INR 2.11 9/13 3. Pain Management: Ultram as needed 4. Mood: Xanax 0.25 mg 3 times daily as needed  -pt's mood has become more positive 5. Neuropsych: This patient is capable of making decisions on her own behalf.  -team to provide ego support as needed 6. Skin/Wound Care:    -BID w-d dressings per surgery  -family ed went well yesterday  -follow up with HHRN 7. Fluids/Electrolytes/Nutrition: encourae PO 8.  Acute blood loss anemia.    hgb 8.7 on 9/5 9.  Atrial fibrillation.  Cardizem 360 mg daily, Tikosyn 250 mg twice daily.  Cardiac rate controlled  -explained that she is now on cardizem per cardiology recs d/t RVR while on acute sie of hospital 10.  Hypertension.  Norvasc 2.5 mg daily  Controlled on 9/13 11.  Rheumatoid arthritis.  Plaquenil 200 mg twice daily 12.  Hyperlipidemia.  Lipitor 13.  Left lower extremity edema: Dopplers from last week were negative.  She has no pain no limb.    Appears sl improved    Cont with TED stockings,  ACE wrap if needed 14. Hyponatremia  Sodium 134on 9/13  Cont to monitor 15. Hypokalemia  Potassium 4.2 9/13  Cont daily potassium to 40 mEq twice daily---may be able to back down slightly, based on tomorrows lab  Mag  1.8 on 9/13--maintain magox TID  -will need outpt follow up of electrolytes   -BMET and Mg++ on Monday  LOS (Days) [place] EVALUATION WAS PERFORMED  [name], MD 08/22/2018 9:22 AM

## 2018-08-22 NOTE — Discharge Instructions (Signed)
Inpatient Rehab Discharge Instructions  Melody Casey Discharge date and time: No discharge date for patient encounter.   Activities/Precautions/ Functional Status: Activity: activity as tolerated Diet: soft Wound Care: keep wound clean and dry Functional status:  ___ No restrictions     ___ Walk up steps independently ___ 24/7 supervision/assistance   ___ Walk up steps with assistance ___ Intermittent supervision/assistance  ___ Bathe/dress independently ___ Walk with walker     _x__ Bathe/dress with assistance ___ Walk Independently    ___ Shower independently ___ Walk with assistance    ___ Shower with assistance ___ No alcohol     ___ Return to work/school ________  Special Instructions: Home health nurse to check INR 08/25/2018 on results to [facility] [phone] fax [phone]  Wound care wet-to-dry to midline wound twice daily gently rub the white non-pink area with wet 4 x 4 with each dressing change.  1/2 inch packing strip to pack into the openings at the top of the wound and the very bottom  COMMUNITY REFERRALS UPON DISCHARGE:    Home Health:   PT, OT, RN   [place]   Date of last service:08/23/2018   Medical Equipment/Items [place]   [facility], [place] [phone]  OPEN ABDOMINAL SURGERY: POST OP INSTRUCTIONS  Always review your discharge instruction sheet given to you by the facility where your surgery was performed.  IF YOU HAVE DISABILITY OR FAMILY LEAVE FORMS, YOU MUST BRING THEM TO THE OFFICE FOR PROCESSING.  PLEASE DO NOT GIVE THEM TO YOUR DOCTOR.  1. A prescription for pain medication may be given to you upon discharge.  Take your pain medication as prescribed, if needed.  If narcotic pain medicine is not needed, then you may take acetaminophen (Tylenol) or ibuprofen (Advil) as needed. 2. Take your  usually prescribed medications unless otherwise directed. 3. If you need a refill on your pain medication, please contact your pharmacy. They will contact our office to request authorization.  Prescriptions will not be filled after 5pm or on week-ends. 4. You should follow a light diet the first few days after arrival home, such as soup and crackers, pudding, etc.unless your doctor has advised otherwise. A high-fiber, low fat diet can be resumed as tolerated.   Be sure to include lots of fluids daily. Most patients will experience some swelling and bruising on the chest and neck area.  Ice packs will help.  Swelling and bruising can take several days to resolve 5. Most patients will experience some swelling and bruising in the area of the incision. Ice pack will help. Swelling and bruising can take several days to resolve..  6. It is common to experience some constipation if taking pain medication after surgery.  Increasing fluid intake and taking a stool softener will usually help or prevent this problem from occurring.  A mild laxative (Milk of Magnesia or Miralax) should be taken according to package directions if there are no bowel movements after 48 hours. 7.  You may have steri-strips (small skin tapes) in place directly over the incision.  These strips should be left on the skin for 7-10 days.  If your surgeon used skin glue on the incision, you may shower in 24 hours.  The glue will flake off over the next 2-3 weeks.  Any sutures or staples will be removed at the office during your follow-up visit. You  may find that a light gauze bandage over your incision may keep your staples from being rubbed or pulled. You may shower and replace the bandage daily. 8. ACTIVITIES:  You may resume regular (light) daily activities beginning the next day--such as daily self-care, walking, climbing stairs--gradually increasing activities as tolerated.  You may have sexual intercourse when it is comfortable.  Refrain from  any heavy lifting or straining until approved by your doctor. a. You may drive when you no longer are taking prescription pain medication, you can comfortably wear a seatbelt, and you can safely maneuver your car and apply brakes b. Return to Work: ___________________________________ 9. You should see your doctor in the office for a follow-up appointment approximately two weeks after your surgery.  Make sure that you call for this appointment within a day or two after you arrive home to insure a convenient appointment time. OTHER INSTRUCTIONS:  _____________________________________________________________ _____________________________________________________________  WHEN TO CALL YOUR DOCTOR: 1. Fever over 101.0 2. Inability to urinate 3. Nausea and/or vomiting 4. Extreme swelling or bruising 5. Continued bleeding from incision. 6. Increased pain, redness, or drainage from the incision. 7. Difficulty swallowing or breathing 8. Muscle cramping or spasms. 9. Numbness or tingling in hands or feet or around lips.  The clinic staff is available to answer your questions during regular business hours.  Please dont hesitate to call and ask to speak to one of the nurses if you have concerns.  For further questions, please visit [email or web address]   Colostomy Home Guide, Adult A colostomy is a surgical procedure to make an opening (stoma) for stool (feces) to leave your body. This surgery is done when a medical condition prevents stool from leaving your body through the end of the large intestine (rectum). During the surgery, part of the large intestine (colon) is attached to the stoma that is made in the front of your abdomen. A bag (pouch) is fitted over the stoma. Stool and gas will collect in the bag. After having this surgery, you will need to empty and change your colostomy bag as needed. You will also need to care for the stoma. How do I care for my stoma? Your stoma should  look pink, red, and moist, like the inside of your cheek. At first, the stoma may be swollen, but this swelling will go away within 6 weeks. To care for the stoma:  Keep the skin around the stoma clean and dry.  Use a clean, soft washcloth to gently wash the stoma and the skin around it. ? Use warm water and only use cleansers recommended by your health care provider. ? Rinse the stoma area with plain water. ? Dry the area well.  Use stoma powder or ointment on your skin only as told by your health care provider. Do not use any other powders, gels, wipes, or creams on your skin.  Change your colostomy bag if your skin becomes irritated. Irritation may indicate that the bag is leaking.  Check your stoma area every day for signs of infection. Check for: ? More redness, swelling, or pain. ? More fluid or blood. ? Pus or warmth.  Measure the stoma opening regularly and record the size. Watch for changes. Share this information with your health care provider.  How do I care for my colostomy bag? The bag that fits over the stoma can have either one or two pieces.  One-piece bag: The skin barrier and the bag are combined in a single unit.  Two-piece bag: The  skin barrier and the bag are separate pieces that attach to each other.  Empty your bag at bedtime and whenever it is one-third to one-half full. Do not let more stool or gas build up. This could cause the bag to leak. Some colostomy bags have a built-in gas release valve. Change the bag every 3-4 days or as told by your health care provider. Also change the bag if it is leaking or separating from the skin or your skin looks irritated. How do I empty my colostomy bag? Before you leave the hospital, you will be taught how to empty your bag. Follow these basic steps: 1. Wash your hands with soap and water. 2. Sit far back on the toilet. 3. Put several pieces of toilet paper into the toilet water. This will prevent splashing as you empty  the stool into the toilet. 4. Remove the clip or the velcro from the tail end of the bag. 5. Unroll the tail, then empty stool into the toilet. 6. Clean the tail with toilet paper. 7. Reroll the tail, and close it with the clip or velcro. 8. Wash your hands again.  How do I change my colostomy bag? Before you leave the hospital, you will be taught how to change your bag. Always have colostomy supplies with you, and follow these basic steps: 1. Wash your hands with soap and water. Have paper towels or tissues near you to clean any discharge. 2. Use a template to pre-cut the skin barrier. Smooth any rough edges. 3. If using a two-piece bag, attach the bag and the skin barrier to each other. Add the barrier ring, if you use one. 4. If your stools are watery, add a few cotton balls to the new bag to absorb the liquid. 5. Remove the old bag and skin barrier. Gently push the skin away from the barrier with your fingers or a warm cloth. 6. Wash your hands again. Then clean the stoma area as directed with water or with mild soap and water. Use water to rinse away any soap. 7. Dry the skin. You may use the cool setting on a hair dryer to do this. 8. If directed, apply stoma powder or skin barrier gel to the skin. 9. Dry the skin again. 10. Warm the skin barrier with your hands or a warm compress. 11. Remove the paper from the sticky (adhesive) strip of the skin barrier. 12. Press the adhesive strip onto the skin around the stoma. 13. Gently rub the skin barrier onto the skin. This creates heat that helps the barrier to stick. 14. Apply stoma tape to the edges of the skin barrier.  What are some general tips?  Avoid wearing clothes that are tight directly over your stoma.  You may shower or bathe with the colostomy bag on or off. Do not use harsh or oily soaps or lotions. Dry the skin and bag after bathing.  Store all supplies in a cool, dry place. Do not leave supplies in extreme heat because  parts can melt.  Whenever you leave home, take an extra skin barrier and colostomy bag with you.  If your colostomy bag gets wet, you can dry it with a hair dryer on the cool setting.  To prevent odor, put drops of ostomy deodorizer in the colostomy bag. Your health care provider may also recommend putting ostomy lubricant inside the bag. This helps the stool to slide out of the bag more easily and completely. Contact a health care provider  if:  You have more redness, swelling, or pain around your stoma.  You have more fluid or blood coming from your stoma.  Your stoma feels warm to the touch.  You have pus coming from your stoma.  Your stoma extends in or out farther than normal.  You need to change the bag every day.  You have a fever. Get help right away if:  Your stool is bloody.  You vomit.  You have trouble breathing. This information is not intended to replace advice given to you by your health care provider. Make sure you discuss any questions you have with your health care provider. Document Released: 11/29/2003 Document Revised: 04/05/2016 Document Reviewed: 03/31/2014 Elsevier Interactive Patient Education  2018 [employer].  Mechanical Wound Debridement Mechanical wound debridement is a treatment to remove dead tissue from a wound. This helps the wound heal. The treatment involves cleaning the wound (irrigation) and using a pad or gauze (dressing) to remove dead tissue and debris from the wound. There are different types of mechanical wound debridement. Depending on the wound, you may need to repeat this procedure or change to another form of debridement as your wound starts to heal. Tell a health care provider about:  Any allergies you have.  All medicines you are taking, including vitamins, herbs, eye drops, creams, and over-the-counter medicines.  Any blood disorders you have.  Any medical conditions you have, including any conditions that: ? Cause a  significant decrease in blood circulation to the part of the body where the wound is, such as peripheral vascular disease. ? Compromise your defense (immune) system or white blood count.  Any surgeries you have had.  Whether you are pregnant or may be pregnant. What are the risks? Generally, this is a safe procedure. However, problems may occur, including:  Infection.  Bleeding.  Damage to healthy tissue in and around your wound.  Soreness or pain.  Failure of the wound to heal.  Scarring.  What happens before the procedure? You may be given antibiotic medicine to help prevent infection. What happens during the procedure?  Your health care provider may apply a numbing medicine (topical anesthetic) to the wound.  Your health care provider will irrigate your wound with a germ-free (sterile), salt-water (saline) solution. This removes debris, bacteria, and dead tissue.  Depending on what type of mechanical wound debridement you are having, your health care provider may do one of the following: ? Put a dressing on your wound. You may have dry gauze pad placed into the wound. Your health care provider will remove the gauze after the wound is dry. Any dead tissue and debris that has dried into the gauze will be lifted out of the wound (wet-to-dry debridement). ? Use a type of pad (monofilament fiber debridement pad). This pad has a fluffy surface on one side that picks up dead tissue and debris from your wound. Your health care provider wets the pad and wipes it over your wound for several minutes. ? Irrigate your wound with a pressurized stream of solution such as saline or water.  Once your health care provider is finished, he or she may apply a light dressing to your wound. The procedure may vary among health care providers and hospitals. What happens after the procedure?  You may receive medicine for pain.  You will continue to receive antibiotic medicine if it was started  before your procedure. This information is not intended to replace advice given to you by your health care  provider. Make sure you discuss any questions you have with your health care provider. Document Released: 08/17/2015 Document Revised: 05/03/2016 Document Reviewed: 04/06/2015 Elsevier Interactive Patient Education  [employer].

## 2018-08-23 LAB — BASIC METABOLIC PANEL
ANION GAP: 9 (ref 5–15)
BUN: 9 mg/dL (ref 8–23)
CALCIUM: 9.9 mg/dL (ref 8.9–10.3)
CO2: 21 mmol/L — AB (ref 22–32)
Chloride: 105 mmol/L (ref 98–111)
Creatinine, Ser: 0.52 mg/dL (ref 0.44–1.00)
GFR calc non Af Amer: >60 mL/min (ref >60)
Glucose, Bld: 117 mg/dL — ABNORMAL HIGH (ref 70–99)
POTASSIUM: 4.1 mmol/L (ref 3.5–5.1)
Sodium: 135 mmol/L (ref 135–145)

## 2018-08-23 LAB — MAGNESIUM: Magnesium: 2 mg/dL (ref 1.7–2.4)

## 2018-08-23 NOTE — Progress Notes (Signed)
Pt discharged home with husband. Dressinf to abdomen changed prior to discharge. No c/o pain or discomfort voiced pt did not seem to be in any distress at time of discharge.

## 2018-08-23 NOTE — Plan of Care (Signed)
  Problem: RH BOWEL ELIMINATION Goal: RH STG MANAGE BOWEL WITH ASSISTANCE Description STG Manage Bowel with Assistance. Mod assist to manage ostomy  Outcome: Progressing   Problem: RH BLADDER ELIMINATION Goal: RH STG MANAGE BLADDER WITH ASSISTANCE Description STG Manage Bladder With Assistance. Min assist  Outcome: Progressing   Problem: RH SKIN INTEGRITY Goal: RH STG MAINTAIN SKIN INTEGRITY WITH ASSISTANCE Description STG Maintain Skin Integrity With Assistance. Mod assist  Outcome: Progressing Goal: RH STG ABLE TO PERFORM INCISION/WOUND CARE W/ASSISTANCE Description STG Able To Perform Incision/Wound Care With Assistance. Mod assist  Outcome: Progressing   Problem: RH PAIN MANAGEMENT Goal: RH STG PAIN MANAGED AT OR BELOW PT'S PAIN GOAL Description Less than 4  Outcome: Progressing   Problem: Education: Goal: Knowledge of ostomy care will improve Outcome: Progressing Goal: Understanding of discharge needs will improve Outcome: Progressing   Problem: Bowel/Gastric/Urinary: Goal: Gastrointestinal status for postoperative course will improve Outcome: Progressing   Problem: Consults Goal: RH GENERAL PATIENT EDUCATION Description See Patient Education module for education specifics. Outcome: Progressing

## 2018-08-23 NOTE — Progress Notes (Signed)
[facility] for Heparin >> warfarin Indication: atrial fibrillation  Allergies  Allergen Reactions  . Miralax [Polyethylene Glycol] Swelling and Rash    Took [facility] brand developed rash. Patient states she tolerated name brand MiraLax in the past.  09/01/15. Patient states she had diffuse swelling including swelling of her lips.   . Vancomycin Rash and Shortness Of Breath  . Acetaminophen Hives  . Oxycodone-Acetaminophen Itching  . Sulfa Antibiotics Rash    All over rash  . Sulfacetamide Sodium Rash    All over rash  . Sulfasalazine Rash    All over rash All over rash  . Banana Other (See Comments)    Unknown  . Latex Rash  . Tape Rash   Patient Measurements: Height: 5\' 8"  (172.7 cm) Weight: 198 lb 6.6 oz (90 kg) IBW/kg (Calculated) : 63.9 Heparin Dosing Weight: 76 kg  Vital Signs: Temp: 98.3 F (36.8 C) (09/14 0459) BP: 124/66 (09/14 0459) Pulse Rate: 92 (09/14 0459)  Labs: Recent Labs    08/21/18 0606 08/22/18 0646  LABPROT  --  23.5*  INR  --  2.11  CREATININE 0.52 0.54   Estimated Creatinine Clearance: 72.4 mL/min (by C-G formula based on SCr of 0.54 mg/dL).  Assessment: 52 yoF on Coumadin 5mg  for Afib PTA. Heparin bridge given pt in Afib RVR and CHADsVAsc 7. INR therapeutic at 2.11 on 9/13. Hgb stable low at 8.7, plts wnl. No signs of bleeding noted.   Mag and k+ monitoring on Tikosyn. Goal of k>4, mg>2  Goal of Therapy:  INR 2-3 Heparin level 0.3-0.5 (lower goal) Monitor platelets by anticoagulation protocol: Yes   Plan:  Continue with PTA Warfarin 5 mg qday INR checks MWF  Harrietta Guardian, PharmD PGY1 Pharmacy Resident 08/23/2018    8:28 AM

## 2018-08-23 NOTE — Progress Notes (Signed)
[facility] PHYSICAL MEDICINE & REHABILITATION     PROGRESS NOTE    Subjective/Complaints: Patient seen lying in bed this morning. She states she slept well overnight. She states she is ready for discharge.  ROS: Denies CP, SOB, N/V/D  Objective:  No results found. No results for input(s): WBC, HGB, HCT, PLT in the last 72 hours. Recent Labs    08/21/18 0606 08/22/18 0646  NA 135 134*  K 4.6 4.2  CL 107 106  GLUCOSE 97 106*  BUN 13 12  CREATININE 0.52 0.54  CALCIUM 9.6 9.5   CBG (last 3)  No results for input(s): GLUCAP in the last 72 hours.  Wt Readings from Last 3 Encounters:  08/20/18 90 kg  08/04/18 75.3 kg  07/22/18 75.8 kg     Intake/Output Summary (Last 24 hours) at 08/23/2018 0950 Last data filed at 08/22/2018 1848 Gross per 24 hour  Intake 480 ml  Output -  Net 480 ml    Vital Signs: Blood pressure 124/66, pulse 92, temperature 98.3 F (36.8 C), resp. rate 16, height 5\' 8"  (1.727 m), weight 90 kg, SpO2 94 %. Physical Exam:  Constitutional: No distress . Vital signs reviewed. HENT: Normocephalic.  Atraumatic. Eyes: EOMI. No discharge. Cardiovascular: RRR. No JVD. Respiratory: CTA Bilaterally. Normal effort. GI: BS +. Non-distended. Musc: No edema or tenderness in extremities. Musculoskeletal: Right foot atrophy with contracture Neurological: She is alert and oriented Motor:  Right lower extremity: Hip flexion, knee extension 4/5, ankle contracture limits foot movement Left lower extremity: Hip flexion, knee extension 4-/5, ankle 4-/5, improving  Skin: skin warm/dry, abd wound pink/clean granulation, ostomy sealed Psychiatric: pleasant  Assessment/Plan: 1. Functional deficits secondary to debility after colectomy which require 3+ hours per day of interdisciplinary therapy in a comprehensive inpatient rehab setting. Physiatrist is providing close team supervision and 24 hour management of active medical problems listed below. Physiatrist and rehab  team continue to assess barriers to discharge/monitor patient progress toward functional and medical goals.  Function:  Bathing Bathing position Bathing activity did not occur: Refused Position: Wheelchair/chair at sink  Bathing parts Body parts bathed by patient: Right arm, Left arm, Chest, Right upper leg, Left upper leg, Front perineal area, Left lower leg, Abdomen, Right lower leg, Buttocks, Back Body parts bathed by helper: Right lower leg, Left lower leg, Back, Buttocks  Bathing assist Assist Level: Supervision or verbal cues      Upper Body Dressing/Undressing Upper body dressing Upper body dressing/undressing activity did not occur: Refused What is the patient wearing?: Pull over shirt/dress     Pull over shirt/dress - Perfomed by patient: Thread/unthread right sleeve, Put head through opening, Thread/unthread left sleeve, Pull shirt over trunk Pull over shirt/dress - Perfomed by helper: Pull shirt over trunk        Upper body assist Assist Level: More than reasonable time   Set up : To obtain clothing/put away  Lower Body Dressing/Undressing Lower body dressing Lower body dressing/undressing activity did not occur: Refused What is the patient wearing?: Pants Underwear - Performed by patient: Thread/unthread right underwear leg, Pull underwear up/down, Thread/unthread left underwear leg Underwear - Performed by helper: Thread/unthread right underwear leg, Thread/unthread left underwear leg, Pull underwear up/down Pants- Performed by patient: Thread/unthread right pants leg, Thread/unthread left pants leg, Pull pants up/down Pants- Performed by helper: Thread/unthread right pants leg, Thread/unthread left pants leg, Pull pants up/down   Non-skid slipper socks- Performed by helper: Don/doff left sock, Don/doff right sock  Shoes - Performed by helper: Don/doff right shoe, Don/doff left shoe       TED Hose - Performed by helper: Don/doff left TED hose  Lower body  assist Assist for lower body dressing: Supervision or verbal cues      Toileting Toileting   Toileting steps completed by patient: Adjust clothing after toileting, Adjust clothing prior to toileting, Performs perineal hygiene Toileting steps completed by helper: Adjust clothing prior to toileting, Performs perineal hygiene, Adjust clothing after toileting Toileting Assistive Devices: Grab bar or rail  Toileting assist Assist level: Touching or steadying assistance (Pt.75%), Supervision or verbal cues   Transfers Chair/bed transfer   Chair/bed transfer method: Ambulatory Chair/bed transfer assist level: Supervision or verbal cues Chair/bed transfer assistive device: Armrests, Walker Mechanical lift: Ecologist     Max distance: 150 Assist level: Supervision or verbal cues   Wheelchair   Type: Manual Max wheelchair distance: 150 Assist Level: Supervision or verbal cues  Cognition Comprehension Comprehension assist level: Understands basic 90% of the time/cues < 10% of the time  Expression Expression assist level: Expresses complex ideas: With no assist  Social Interaction Social Interaction assist level: Interacts appropriately with others - No medications needed.  Problem Solving Problem solving assist level: Solves basic problems with no assist  Memory/ Memory assist level: Recognizes or recalls 90% of the time/requires cueing < 10% of the time   Medical Problem List and Plan: 1.  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    -finalize family education  2.  DVT Prophylaxis/Anticoagulation: Heparin bridged to chronic Coumadin resumed for history of atrial fibrillation  -INR therapeutic on 9//14 3. Pain Management: Ultram as needed 4. Mood: Xanax 0.25 mg 3 times  daily as needed  -pt's mood has become more positive 5. Neuropsych: This patient is capable of making decisions on her own behalf.  -team to provide ego support as needed 6. Skin/Wound Care:    -BID w-d dressings per surgery  -family ed went well  -follow up with HHRN 7. Fluids/Electrolytes/Nutrition: encourae PO 8.  Acute blood loss anemia.    hgb 8.7 on 9/5 9.  Atrial fibrillation.  Cardizem 360 mg daily, Tikosyn 250 mg twice daily.  Cardiac rate controlled  -she is now on cardizem per cardiology recs d/t RVR while on acute sie of hospital 10.  Hypertension.  Norvasc 2.5 mg daily  Controlled on 9/14 11.  Rheumatoid arthritis.  Plaquenil 200 mg twice daily 12.  Hyperlipidemia.  Lipitor 13.  Left lower extremity edema: Dopplers from last week were negative.  She has no pain no limb.    Appears sl improved    Cont with TED stockings,  ACE wrap if needed 14. Hyponatremia  Sodium 134 on 9/13  Cont to monitor 15. Hypokalemia  Potassium 4.2 9/13  Cont potassium supplement  Mag  1.8 on 9/13--maintain magox TID  -will need outpt follow up of electrolytes   -BMET and Mg++ on Monday    LOS (Days) [place] EVALUATION WAS PERFORMED  Denitra Donaghey Lorie Phenix, MD 08/23/2018 9:50 AM

## 2018-08-24 DIAGNOSIS — I482 Chronic atrial fibrillation: Secondary | ICD-10-CM | POA: Diagnosis not present

## 2018-08-24 DIAGNOSIS — D1803 Hemangioma of intra-abdominal structures: Secondary | ICD-10-CM | POA: Diagnosis not present

## 2018-08-24 DIAGNOSIS — Z48815 Encounter for surgical aftercare following surgery on the digestive system: Secondary | ICD-10-CM | POA: Diagnosis not present

## 2018-08-24 DIAGNOSIS — M069 Rheumatoid arthritis, unspecified: Secondary | ICD-10-CM | POA: Diagnosis not present

## 2018-08-24 DIAGNOSIS — Z853 Personal history of malignant neoplasm of breast: Secondary | ICD-10-CM | POA: Diagnosis not present

## 2018-08-24 DIAGNOSIS — Z433 Encounter for attention to colostomy: Secondary | ICD-10-CM | POA: Diagnosis not present

## 2018-08-24 DIAGNOSIS — I2510 Atherosclerotic heart disease of native coronary artery without angina pectoris: Secondary | ICD-10-CM | POA: Diagnosis not present

## 2018-08-24 DIAGNOSIS — I714 Abdominal aortic aneurysm, without rupture: Secondary | ICD-10-CM | POA: Diagnosis not present

## 2018-08-24 DIAGNOSIS — I252 Old myocardial infarction: Secondary | ICD-10-CM | POA: Diagnosis not present

## 2018-08-24 DIAGNOSIS — I10 Essential (primary) hypertension: Secondary | ICD-10-CM | POA: Diagnosis not present

## 2018-08-24 DIAGNOSIS — E119 Type 2 diabetes mellitus without complications: Secondary | ICD-10-CM | POA: Diagnosis not present

## 2018-08-24 DIAGNOSIS — Z5181 Encounter for therapeutic drug level monitoring: Secondary | ICD-10-CM | POA: Diagnosis not present

## 2018-08-24 DIAGNOSIS — Z87891 Personal history of nicotine dependence: Secondary | ICD-10-CM | POA: Diagnosis not present

## 2018-08-24 DIAGNOSIS — Z79899 Other long term (current) drug therapy: Secondary | ICD-10-CM | POA: Diagnosis not present

## 2018-08-24 DIAGNOSIS — M810 Age-related osteoporosis without current pathological fracture: Secondary | ICD-10-CM | POA: Diagnosis not present

## 2018-08-24 DIAGNOSIS — Z955 Presence of coronary angioplasty implant and graft: Secondary | ICD-10-CM | POA: Diagnosis not present

## 2018-08-24 DIAGNOSIS — Z7901 Long term (current) use of anticoagulants: Secondary | ICD-10-CM | POA: Diagnosis not present

## 2018-08-25 ENCOUNTER — Telehealth: Payer: Self-pay | Admitting: Registered Nurse

## 2018-08-25 ENCOUNTER — Telehealth: Payer: Self-pay | Admitting: *Deleted

## 2018-08-25 DIAGNOSIS — Z48815 Encounter for surgical aftercare following surgery on the digestive system: Secondary | ICD-10-CM | POA: Diagnosis not present

## 2018-08-25 DIAGNOSIS — E119 Type 2 diabetes mellitus without complications: Secondary | ICD-10-CM | POA: Diagnosis not present

## 2018-08-25 DIAGNOSIS — M069 Rheumatoid arthritis, unspecified: Secondary | ICD-10-CM | POA: Diagnosis not present

## 2018-08-25 DIAGNOSIS — I482 Chronic atrial fibrillation: Secondary | ICD-10-CM | POA: Diagnosis not present

## 2018-08-25 DIAGNOSIS — Z433 Encounter for attention to colostomy: Secondary | ICD-10-CM | POA: Diagnosis not present

## 2018-08-25 DIAGNOSIS — I2510 Atherosclerotic heart disease of native coronary artery without angina pectoris: Secondary | ICD-10-CM | POA: Diagnosis not present

## 2018-08-25 NOTE — Telephone Encounter (Signed)
Transitional Care call Transitional Care Call Completed.   Patient name: Melody Casey DOB: 04-11-1946 1. Are you/is patient experiencing any problems since coming home? No a. Are there any questions regarding any aspect of care? No 2. Are there any questions regarding medications administration/dosing? No a. Are meds being taken as prescribed? Yes b. "Patient should review meds with caller to confirm" Medication List Reviewed 3. Have there been any falls? No 4. Has Home Health been to the house and/or have they contacted you? Yes, [facility] a. If not, have you tried to contact them? NA b. Can we help you contact them? NA 5. Are bowels and bladder emptying properly? Ostomy functioning and urinating without any problems she reports.  a. Are there any unexpected incontinence issues? No b. If applicable, is patient following bowel/bladder programs? NA 6. Any fevers, problems with breathing, unexpected pain? No 7. Are there any skin problems or new areas of breakdown? Melody Casey reports a skin breakdown near ostomy site, she was instructed to have home health nurse assess and have her call office with her assessment, she verbalizes understanding.  8. Has the patient/family member arranged specialty MD follow up (ie cardiology/neurology/renal/surgical/etc.)?  Yes a. Can we help arrange? NA 9. Does the patient need any other services or support that we can help arrange? No 10. Are caregivers following through as expected in assisting the patient? Yes 11. Has the patient quit smoking, drinking alcohol, or using drugs as recommended? Melody Casey reports she doesn't smoke, drink alcohol or use illicit drugs.   Appointment date/time 09/01/2018, arrival time 2:00 for 2:20 appointment with Dr. Naaman Plummer. At [place]

## 2018-08-25 NOTE — Telephone Encounter (Signed)
TC Carol w/ [facility] INR 1.8 Pt has an open wound on abdomen, no current problems Coumadin 5 mg daily

## 2018-08-26 ENCOUNTER — Telehealth: Payer: Self-pay | Admitting: *Deleted

## 2018-08-26 DIAGNOSIS — I482 Chronic atrial fibrillation: Secondary | ICD-10-CM | POA: Diagnosis not present

## 2018-08-26 DIAGNOSIS — E119 Type 2 diabetes mellitus without complications: Secondary | ICD-10-CM | POA: Diagnosis not present

## 2018-08-26 DIAGNOSIS — M069 Rheumatoid arthritis, unspecified: Secondary | ICD-10-CM | POA: Diagnosis not present

## 2018-08-26 DIAGNOSIS — Z433 Encounter for attention to colostomy: Secondary | ICD-10-CM | POA: Diagnosis not present

## 2018-08-26 DIAGNOSIS — Z48815 Encounter for surgical aftercare following surgery on the digestive system: Secondary | ICD-10-CM | POA: Diagnosis not present

## 2018-08-26 DIAGNOSIS — I2510 Atherosclerotic heart disease of native coronary artery without angina pectoris: Secondary | ICD-10-CM | POA: Diagnosis not present

## 2018-08-26 NOTE — Telephone Encounter (Signed)
Melody Casey PT [facility] requesting 2wk3.  Approval given.

## 2018-08-26 NOTE — Telephone Encounter (Signed)
Have patient keep taking the same dose for now, she had been running higher and this will probably average out, recheck in 2 weeks

## 2018-08-26 NOTE — Telephone Encounter (Signed)
Christy RN [facility] called for SN orders 3wk1,2wk2,1wk6 for wound management and ostomy care teaching. Approval given.

## 2018-08-28 ENCOUNTER — Ambulatory Visit: Payer: Medicare Other | Admitting: Family Medicine

## 2018-08-28 DIAGNOSIS — E119 Type 2 diabetes mellitus without complications: Secondary | ICD-10-CM | POA: Diagnosis not present

## 2018-08-28 DIAGNOSIS — I2510 Atherosclerotic heart disease of native coronary artery without angina pectoris: Secondary | ICD-10-CM | POA: Diagnosis not present

## 2018-08-28 DIAGNOSIS — Z433 Encounter for attention to colostomy: Secondary | ICD-10-CM | POA: Diagnosis not present

## 2018-08-28 DIAGNOSIS — Z48815 Encounter for surgical aftercare following surgery on the digestive system: Secondary | ICD-10-CM | POA: Diagnosis not present

## 2018-08-28 DIAGNOSIS — M069 Rheumatoid arthritis, unspecified: Secondary | ICD-10-CM | POA: Diagnosis not present

## 2018-08-28 DIAGNOSIS — I482 Chronic atrial fibrillation: Secondary | ICD-10-CM | POA: Diagnosis not present

## 2018-08-29 DIAGNOSIS — Z48815 Encounter for surgical aftercare following surgery on the digestive system: Secondary | ICD-10-CM | POA: Diagnosis not present

## 2018-08-29 DIAGNOSIS — Z433 Encounter for attention to colostomy: Secondary | ICD-10-CM | POA: Diagnosis not present

## 2018-08-29 DIAGNOSIS — M069 Rheumatoid arthritis, unspecified: Secondary | ICD-10-CM | POA: Diagnosis not present

## 2018-08-29 DIAGNOSIS — E119 Type 2 diabetes mellitus without complications: Secondary | ICD-10-CM | POA: Diagnosis not present

## 2018-08-29 DIAGNOSIS — I482 Chronic atrial fibrillation: Secondary | ICD-10-CM | POA: Diagnosis not present

## 2018-08-29 DIAGNOSIS — I2510 Atherosclerotic heart disease of native coronary artery without angina pectoris: Secondary | ICD-10-CM | POA: Diagnosis not present

## 2018-09-01 ENCOUNTER — Encounter: Payer: Medicare Other | Attending: Physical Medicine & Rehabilitation | Admitting: Physical Medicine & Rehabilitation

## 2018-09-01 ENCOUNTER — Encounter: Payer: Self-pay | Admitting: Physical Medicine & Rehabilitation

## 2018-09-01 VITALS — BP 101/68 | HR 93 | Resp 14 | Ht 68.0 in | Wt 157.0 lb

## 2018-09-01 DIAGNOSIS — Z48815 Encounter for surgical aftercare following surgery on the digestive system: Secondary | ICD-10-CM | POA: Diagnosis not present

## 2018-09-01 DIAGNOSIS — K631 Perforation of intestine (nontraumatic): Secondary | ICD-10-CM | POA: Diagnosis not present

## 2018-09-01 DIAGNOSIS — T8131XS Disruption of external operation (surgical) wound, not elsewhere classified, sequela: Secondary | ICD-10-CM

## 2018-09-01 DIAGNOSIS — R5381 Other malaise: Secondary | ICD-10-CM | POA: Diagnosis not present

## 2018-09-01 DIAGNOSIS — Z87891 Personal history of nicotine dependence: Secondary | ICD-10-CM | POA: Diagnosis not present

## 2018-09-01 DIAGNOSIS — F419 Anxiety disorder, unspecified: Secondary | ICD-10-CM | POA: Diagnosis not present

## 2018-09-01 DIAGNOSIS — I252 Old myocardial infarction: Secondary | ICD-10-CM | POA: Insufficient documentation

## 2018-09-01 DIAGNOSIS — I4891 Unspecified atrial fibrillation: Secondary | ICD-10-CM | POA: Insufficient documentation

## 2018-09-01 DIAGNOSIS — Z79899 Other long term (current) drug therapy: Secondary | ICD-10-CM | POA: Diagnosis not present

## 2018-09-01 DIAGNOSIS — I2510 Atherosclerotic heart disease of native coronary artery without angina pectoris: Secondary | ICD-10-CM | POA: Insufficient documentation

## 2018-09-01 DIAGNOSIS — E119 Type 2 diabetes mellitus without complications: Secondary | ICD-10-CM | POA: Insufficient documentation

## 2018-09-01 DIAGNOSIS — M069 Rheumatoid arthritis, unspecified: Secondary | ICD-10-CM | POA: Insufficient documentation

## 2018-09-01 DIAGNOSIS — Z8249 Family history of ischemic heart disease and other diseases of the circulatory system: Secondary | ICD-10-CM | POA: Diagnosis not present

## 2018-09-01 DIAGNOSIS — E785 Hyperlipidemia, unspecified: Secondary | ICD-10-CM | POA: Insufficient documentation

## 2018-09-01 DIAGNOSIS — K5792 Diverticulitis of intestine, part unspecified, without perforation or abscess without bleeding: Secondary | ICD-10-CM | POA: Diagnosis not present

## 2018-09-01 DIAGNOSIS — Z433 Encounter for attention to colostomy: Secondary | ICD-10-CM | POA: Diagnosis not present

## 2018-09-01 DIAGNOSIS — I10 Essential (primary) hypertension: Secondary | ICD-10-CM | POA: Diagnosis not present

## 2018-09-01 DIAGNOSIS — I482 Chronic atrial fibrillation: Secondary | ICD-10-CM | POA: Diagnosis not present

## 2018-09-01 DIAGNOSIS — Z955 Presence of coronary angioplasty implant and graft: Secondary | ICD-10-CM | POA: Insufficient documentation

## 2018-09-01 NOTE — Patient Instructions (Addendum)
PLEASE FEEL FREE TO CALL OUR OFFICE WITH ANY PROBLEMS OR QUESTIONS ([phone])    TRY DRY DRESSINGS WITH WOUND GIVEN THE INCREASED DRAINAGE.   GENTLY  SCRUB WITH GAUZE MOISTENED IN SALINE  FOLLOW UP WITH SURGERY FOR FURTHER RECS.

## 2018-09-01 NOTE — Progress Notes (Signed)
Subjective:    Patient ID: Melody Casey, female    DOB: March 07, 1946, 72 y.o.   MRN: [id number]  HPI   Mrs Melody Casey is here in follow up of of debility. She was scheduled for outpt therapies her in [place] but drive was too long. She is now getting [facility] with [facility]. She is using a RW in the house and outside. She uses her cane for short distance.   She is doing more in the home. She is even making her bed now.   Fatigue has still been an issue. Her husband is helping with her ostomy and abdominal wound.  Sleep is fair, but sometimes she needs a xanax to settle down at night.   Pain is improving. There is minimal pain with dressing changes. She is using a rare tramadol now. Her wound is continuing to close but she has questions about the healing process.   Appetite is good, ostomy is functioning well. She is emptying her bladder.    Pain Inventory Average Pain 2 Pain Right Now 2 My pain is dull  In the last 24 hours, has pain interfered with the following? General activity 0 Relation with others 0 Enjoyment of life 0 What TIME of day is your pain at its worst? daytime Sleep (in general) Fair  Pain is worse with: unsure Pain improves with: medication Relief from Meds: 7  Mobility walk with assistance use a walker transfers alone Do you have any goals in this area?  yes  Function I need assistance with the following:  dressing and bathing  Neuro/Psych trouble walking  Prior Studies transitional care  Physicians involved in your care transitional care   Family History  Problem Relation Age of Onset  . Lung cancer Father 86       deceased  . Cancer Father   . Heart failure Mother 14       deceased  . Heart disease Mother   . Diabetes Brother   . Hypertension Brother   . Cancer Brother        prostate  . Colon cancer Neg Hx   . Liver disease Neg Hx    Social History   Socioeconomic History  . Marital status: Married    Spouse name: Not on file  .  Number of children: 2  . Years of education: Not on file  . Highest education level: Not on file  Occupational History  . Occupation: Research scientist (life sciences)  Social Needs  . Financial resource strain: Not on file  . Food insecurity:    Worry: Not on file    Inability: Not on file  . Transportation needs:    Medical: Not on file    Non-medical: Not on file  Tobacco Use  . Smoking status: Former Smoker    Packs/day: 2.00    Years: 50.00    Pack years: 100.00    Types: Cigarettes    Last attempt to quit: 02/08/2011    Years since quitting: 7.5  . Smokeless tobacco: Never Used  Substance and Sexual Activity  . Alcohol use: No    Alcohol/week: 0.0 standard drinks  . Drug use: No  . Sexual activity: Never  Lifestyle  . Physical activity:    Days per week: Not on file    Minutes per session: Not on file  . Stress: Not on file  Relationships  . Social connections:    Talks on phone: Not on file    Gets together: Not  on file    Attends religious service: Not on file    Active member of club or organization: Not on file    Attends meetings of clubs or organizations: Not on file    Relationship status: Not on file  Other Topics Concern  . Not on file  Social History Narrative   Takes care 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    Past Surgical History:  Procedure Laterality Date  . BIOPSY N/A 11/01/2015   Procedure: BIOPSY;  Surgeon: Danie Binder, MD;  Location: [facility];  Service: Endoscopy;  Laterality: N/A;  . BREAST LUMPECTOMY WITH NEEDLE LOCALIZATION AND AXILLARY SENTINEL LYMPH NODE BX Right 03/01/2014   Procedure: BREAST LUMPECTOMY WITH NEEDLE LOCALIZATION AND AXILLARY SENTINEL LYMPH NODE BX;  Surgeon: Edward Jolly, MD;  Location: [facility];  Service: General;  Laterality: Right;  . BREAST SURGERY    . CARDIAC CATHETERIZATION  2012   stent  . COLON RESECTION N/A 07/25/2018   Procedure: COLOSTOMY;  Surgeon: Rolm Bookbinder, MD;  Location: [facility];   Service: General;  Laterality: N/A;  . COLONOSCOPY WITH PROPOFOL N/A 06/29/2014   Dr. Wynetta Emery: universal diverticulosis  . CORONARY STENT PLACEMENT  2012  . ESOPHAGOGASTRODUODENOSCOPY (EGD) WITH PROPOFOL N/A 11/01/2015   Procedure: ESOPHAGOGASTRODUODENOSCOPY (EGD) WITH PROPOFOL;  Surgeon: Danie Binder, MD;  Location: [facility];  Service: Endoscopy;  Laterality: N/A;  . KNEE ARTHROSCOPY     left  . LAPAROTOMY N/A 07/25/2018   Procedure: EXPLORATORY LAPAROTOMY;  Surgeon: Rolm Bookbinder, MD;  Location: [facility];  Service: General;  Laterality: N/A;  . PARTIAL COLECTOMY N/A 07/25/2018   Procedure: COLECTOMY;  Surgeon: Rolm Bookbinder, MD;  Location: [facility];  Service: General;  Laterality: N/A;  . Right leg surgery  age 51   As a child for  ?scleroderma per patient  . TONSILLECTOMY AND ADENOIDECTOMY    . TUBAL LIGATION    . Ureteral surgery  2011   rt ureterostomy-stent   Past Medical History:  Diagnosis Date  . AAA (abdominal aortic aneurysm) ([place])   . Acute diverticulitis   . Anxiety   . Arthritis   . Atrial fibrillation ([place])    a. Dx 03/2011 - on tikosyn/coumadin.  [place] CAD (coronary artery disease)    a. NSTEMI 02/2011: occ mid Cx, DES to OM2, residual nonobst LAD dz.  . Cancer ([place])    breast  . Diabetes mellitus   . Diverticulitis   . Diverticulitis of colon     2008, 04/2011, 12/2014, 08/2015  . 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 Hemangioma    liver  . HLD (hyperlipidemia)   . HTN (hypertension)   . Osteoporosis   . Tobacco abuse    stopped smoking 2012  . Ureteral obstruction    History of gross hematuria/right hydronephrosis 2/2 to uteropelvic junction obstruction, s/p cystoscopy in January 2007 with bilateral retrograde pyelography, right ureter arthroscopy, right ureteral stent placement, bladder biopsies, stent removal since then.  . Wears dentures    top   Resp 14   Ht 5\' 8"  (1.727 m)   Wt 157 lb (71.2 kg)   BMI 23.87 kg/m    Opioid Risk Score:   Fall  Risk Score:  `1  Depression screen PHQ 2/9  Depression screen [facility] 2/9 09/01/2018 07/10/2018 06/30/2018 06/24/2018 06/03/2018 05/12/2018 05/08/2018  Decreased Interest 0 0 0 1 0 0 0  Down, Depressed, Hopeless 0 0 0 1 0 0 0  PHQ -  2 Score 0 0 0 2 0 0 0  Altered sleeping 0 - - 1 - - -  Tired, decreased energy 2 - - 1 - - -  Change in appetite 0 - - 1 - - -  Feeling bad or failure about yourself  0 - - 1 - - -  Trouble concentrating 0 - - 1 - - -  Moving slowly or fidgety/restless 0 - - 1 - - -  Suicidal thoughts 0 - - 1 - - -  PHQ-9 Score 2 - - 9 - - -  Difficult doing work/chores - - - - - - -  Some recent data might be hidden    Review of Systems  Constitutional: Positive for unexpected weight change.  HENT: Negative.   Eyes: Negative.   Respiratory: Negative.   Cardiovascular: Negative.   Gastrointestinal: Negative.   Endocrine: Negative.   Genitourinary: Negative.   Musculoskeletal: Positive for gait problem.  Allergic/Immunologic: Negative.   Psychiatric/Behavioral: Negative.        Objective:   Physical Exam   General: Alert and oriented x 3, No apparent distress HEENT: Head is normocephalic, atraumatic, PERRLA, EOMI, sclera anicteric, oral mucosa pink and moist, dentition intact, ext ear canals clear,  Neck: Supple without JVD or lymphadenopathy Heart: Reg rate and rhythm. No murmurs rubs or gallops Chest: CTA bilaterally without wheezes, rales, or rhonchi; no distress Abdomen: Ostomy sealed. Abdominal wound packed. Wound tunnels superiorly and inferiorly. A couple areas of fibronecrotic tissues. Dressing soaked with greyish/red drainage. Area less tender to touch.  Extremities: No clubbing, cyanosis, or edema. Pulses are 2+ Skin: Clean and intact without signs of breakdown Neuro: Pt is cognitively appropriate with normal insight, memory, and awareness. Cranial nerves 2-12 are intact. Sensory exam is normal. Reflexes are 2+ in all 4's. Fine motor coordination is intact. No  tremors. Motor function is grossly 5/5. Walks with sl shuffling gait. Leans over wealker for support Musculoskeletal: Full ROM, No pain with AROM or PROM in the neck, trunk, or extremities. Posture appropriate Psych: Pt's affect is appropriate. Pt is cooperative        Assessment & Plan:  1.  Debility secondary to perforated sigmoid colon and diverticulitis  -continue with HH therapies  -HEP for stamina  -still needs walker for balance at present 2.  Pain management: Tramadol for more severe pain 3.  Mood: mood much miproved. She is motivated. 4.  History of atrial fibrillation: Continue Cardizem Tikosyn per cardiology recommendations. 5.  Rheumatoid arthritis: Plaquenil 200 mg twice daily 6. Wound care; abdominal wound has further closed. However there are still two areas with tunneling. Drainage also seems to have increased.   -advised husband/pt to gently scrub yellow areas with moistened gauze  -also should just use dry dressing to soak addnl drainage  -has surgery follow up on Wednesday  -increase protein intake  Overall from a functional standpoint, she's doing quite well. She has enough providers among her multiple specialists that I don't need to see her also. Will follow up prn. Thirty minutes of face to face patient care time were spent during this visit. All questions were encouraged and answered.

## 2018-09-02 ENCOUNTER — Encounter

## 2018-09-02 ENCOUNTER — Ambulatory Visit: Payer: Medicare Other | Admitting: Gastroenterology

## 2018-09-02 DIAGNOSIS — Z48815 Encounter for surgical aftercare following surgery on the digestive system: Secondary | ICD-10-CM | POA: Diagnosis not present

## 2018-09-02 DIAGNOSIS — I482 Chronic atrial fibrillation: Secondary | ICD-10-CM | POA: Diagnosis not present

## 2018-09-02 DIAGNOSIS — Z433 Encounter for attention to colostomy: Secondary | ICD-10-CM | POA: Diagnosis not present

## 2018-09-02 DIAGNOSIS — E119 Type 2 diabetes mellitus without complications: Secondary | ICD-10-CM | POA: Diagnosis not present

## 2018-09-02 DIAGNOSIS — I2510 Atherosclerotic heart disease of native coronary artery without angina pectoris: Secondary | ICD-10-CM | POA: Diagnosis not present

## 2018-09-02 DIAGNOSIS — M069 Rheumatoid arthritis, unspecified: Secondary | ICD-10-CM | POA: Diagnosis not present

## 2018-09-03 ENCOUNTER — Encounter: Payer: Self-pay | Admitting: Physician Assistant

## 2018-09-03 ENCOUNTER — Ambulatory Visit: Payer: Medicare Other | Admitting: Gastroenterology

## 2018-09-03 ENCOUNTER — Ambulatory Visit (INDEPENDENT_AMBULATORY_CARE_PROVIDER_SITE_OTHER): Payer: Medicare Other | Admitting: Physician Assistant

## 2018-09-03 VITALS — BP 118/65 | HR 74 | Temp 97.3°F | Ht 68.0 in | Wt 160.0 lb

## 2018-09-03 DIAGNOSIS — Z433 Encounter for attention to colostomy: Secondary | ICD-10-CM | POA: Diagnosis not present

## 2018-09-03 DIAGNOSIS — M069 Rheumatoid arthritis, unspecified: Secondary | ICD-10-CM | POA: Diagnosis not present

## 2018-09-03 DIAGNOSIS — H1033 Unspecified acute conjunctivitis, bilateral: Secondary | ICD-10-CM | POA: Diagnosis not present

## 2018-09-03 DIAGNOSIS — I2510 Atherosclerotic heart disease of native coronary artery without angina pectoris: Secondary | ICD-10-CM | POA: Diagnosis not present

## 2018-09-03 DIAGNOSIS — E119 Type 2 diabetes mellitus without complications: Secondary | ICD-10-CM | POA: Diagnosis not present

## 2018-09-03 DIAGNOSIS — I482 Chronic atrial fibrillation: Secondary | ICD-10-CM | POA: Diagnosis not present

## 2018-09-03 DIAGNOSIS — Z48815 Encounter for surgical aftercare following surgery on the digestive system: Secondary | ICD-10-CM | POA: Diagnosis not present

## 2018-09-03 MED ORDER — ERYTHROMYCIN 5 MG/GM OP OINT: 1.0000 "application " | TOPICAL_OINTMENT | Freq: Three times a day (TID) | OPHTHALMIC | 0 refills | Status: DC

## 2018-09-03 MED ORDER — GLUCOSE BLOOD VI STRP: ORAL_STRIP | 12 refills | Status: DC

## 2018-09-04 ENCOUNTER — Other Ambulatory Visit: Payer: Self-pay | Admitting: *Deleted

## 2018-09-04 DIAGNOSIS — Z433 Encounter for attention to colostomy: Secondary | ICD-10-CM | POA: Diagnosis not present

## 2018-09-04 DIAGNOSIS — I2510 Atherosclerotic heart disease of native coronary artery without angina pectoris: Secondary | ICD-10-CM | POA: Diagnosis not present

## 2018-09-04 DIAGNOSIS — M069 Rheumatoid arthritis, unspecified: Secondary | ICD-10-CM | POA: Diagnosis not present

## 2018-09-04 DIAGNOSIS — Z48815 Encounter for surgical aftercare following surgery on the digestive system: Secondary | ICD-10-CM | POA: Diagnosis not present

## 2018-09-04 DIAGNOSIS — E119 Type 2 diabetes mellitus without complications: Secondary | ICD-10-CM | POA: Diagnosis not present

## 2018-09-04 DIAGNOSIS — I482 Chronic atrial fibrillation: Secondary | ICD-10-CM | POA: Diagnosis not present

## 2018-09-04 MED ORDER — GLUCOSE BLOOD VI STRP: ORAL_STRIP | 3 refills | Status: DC

## 2018-09-08 ENCOUNTER — Encounter

## 2018-09-08 ENCOUNTER — Ambulatory Visit (INDEPENDENT_AMBULATORY_CARE_PROVIDER_SITE_OTHER): Payer: Medicare Other | Admitting: Cardiology

## 2018-09-08 ENCOUNTER — Encounter: Payer: Self-pay | Admitting: Cardiology

## 2018-09-08 VITALS — BP 122/76 | HR 85 | Ht 68.0 in | Wt 156.2 lb

## 2018-09-08 DIAGNOSIS — I10 Essential (primary) hypertension: Secondary | ICD-10-CM

## 2018-09-08 DIAGNOSIS — Z5181 Encounter for therapeutic drug level monitoring: Secondary | ICD-10-CM | POA: Diagnosis not present

## 2018-09-08 DIAGNOSIS — I714 Abdominal aortic aneurysm, without rupture, unspecified: Secondary | ICD-10-CM

## 2018-09-08 DIAGNOSIS — Z955 Presence of coronary angioplasty implant and graft: Secondary | ICD-10-CM

## 2018-09-08 DIAGNOSIS — D1803 Hemangioma of intra-abdominal structures: Secondary | ICD-10-CM | POA: Diagnosis not present

## 2018-09-08 DIAGNOSIS — I480 Paroxysmal atrial fibrillation: Secondary | ICD-10-CM | POA: Diagnosis not present

## 2018-09-08 DIAGNOSIS — I252 Old myocardial infarction: Secondary | ICD-10-CM | POA: Diagnosis not present

## 2018-09-08 DIAGNOSIS — Z87891 Personal history of nicotine dependence: Secondary | ICD-10-CM

## 2018-09-08 DIAGNOSIS — M810 Age-related osteoporosis without current pathological fracture: Secondary | ICD-10-CM | POA: Diagnosis not present

## 2018-09-08 DIAGNOSIS — Z79899 Other long term (current) drug therapy: Secondary | ICD-10-CM

## 2018-09-08 DIAGNOSIS — Z853 Personal history of malignant neoplasm of breast: Secondary | ICD-10-CM

## 2018-09-08 DIAGNOSIS — I482 Chronic atrial fibrillation: Secondary | ICD-10-CM | POA: Diagnosis not present

## 2018-09-08 DIAGNOSIS — K631 Perforation of intestine (nontraumatic): Secondary | ICD-10-CM | POA: Diagnosis not present

## 2018-09-08 DIAGNOSIS — Z48815 Encounter for surgical aftercare following surgery on the digestive system: Secondary | ICD-10-CM | POA: Diagnosis not present

## 2018-09-08 DIAGNOSIS — Z433 Encounter for attention to colostomy: Secondary | ICD-10-CM | POA: Diagnosis not present

## 2018-09-08 DIAGNOSIS — E119 Type 2 diabetes mellitus without complications: Secondary | ICD-10-CM

## 2018-09-08 DIAGNOSIS — I2510 Atherosclerotic heart disease of native coronary artery without angina pectoris: Secondary | ICD-10-CM

## 2018-09-08 DIAGNOSIS — Z7901 Long term (current) use of anticoagulants: Secondary | ICD-10-CM

## 2018-09-08 DIAGNOSIS — M069 Rheumatoid arthritis, unspecified: Secondary | ICD-10-CM | POA: Diagnosis not present

## 2018-09-08 LAB — BASIC METABOLIC PANEL
BUN/Creatinine Ratio: 15 (ref 12–28)
BUN: 10 mg/dL (ref 8–27)
CO2: 21 mmol/L (ref 20–29)
Calcium: 10.5 mg/dL — ABNORMAL HIGH (ref 8.7–10.3)
Chloride: 102 mmol/L (ref 96–106)
Creatinine, Ser: 0.66 mg/dL (ref 0.57–1.00)
GFR calc Af Amer: 101 mL/min/{1.73_m2} (ref >59)
GFR calc non Af Amer: 87 mL/min/{1.73_m2} (ref >59)
Glucose: 91 mg/dL (ref 65–99)
Potassium: 4.9 mmol/L (ref 3.5–5.2)
Sodium: 136 mmol/L (ref 134–144)

## 2018-09-08 LAB — MAGNESIUM: Magnesium: 2 mg/dL (ref 1.6–2.3)

## 2018-09-08 NOTE — Assessment & Plan Note (Signed)
Circumflex occlusion February 14, 2011 well treated with DES, moderate LAD stenosis, normal EF

## 2018-09-08 NOTE — Assessment & Plan Note (Signed)
Colectomy, colostomy 07/25/18

## 2018-09-08 NOTE — Assessment & Plan Note (Signed)
NSR on Tikosyn

## 2018-09-08 NOTE — Progress Notes (Signed)
09/08/2018 Melody Casey   1946-10-12  [id number]  Primary Physician Dettinger, [name], MD Primary Cardiologist: Dr Percival Spanish  HPI:  Pleasant 72 y.o. female with a PMH of CAD s/p NSTEMI with PCI/DES to [place] in 2012, PAF on tikosyn and coumadin, infrarenal AAA (3.8 cm on recent CT), HTN, HLD,  recurrent diverticulitis of the colon. She was admitted 07/24/18 with peritonitis due to a perforated diverticulum. 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 Cardiology saw her pre op but not post op. She was transferred to rehab on 08/06/18 and discharged from there 08/22/18. She is doing well from a cardiac standpoint but she had some concerns about her medications. She was placed on Diltiazem 360 during her hospitalization, I assume for AF with RVR but I could find no strips or EKGs that show that. She stopped talking it 3 days ago. She also resumed her Lipitor 40 mg on her own and cut her PO Mg++ back to her pre op dose of 400 mg BID. Her EKG shows NSR-QTc 451.    Current Outpatient Medications  Medication Sig Dispense Refill  . ALPRAZolam (XANAX) 0.25 MG tablet Take 1 tablet (0.25 mg total) by mouth 3 (three) times daily as needed for anxiety. 30 tablet 0  . amLODipine (NORVASC) 2.5 MG tablet Take 1 tablet (2.5 mg total) by mouth daily. Please call to make appointment for further refills. 90 tablet 0  . anastrozole (ARIMIDEX) 1 MG tablet Take 1 tablet (1 mg total) by mouth daily. 90 tablet 3  . atorvastatin (LIPITOR) 40 MG tablet Take 1 tablet (40 mg total) by mouth daily at 6 PM. Take 1 1/2 tablets daily 45 tablet 6  . dofetilide (TIKOSYN) 250 MCG capsule Take 1 capsule (250 mcg total) by mouth 2 (two) times daily. 180 capsule 3  . erythromycin ophthalmic ointment Place 1 application into both eyes 3 (three) times daily. 3.5 g 0  . famotidine (PEPCID) 20 MG tablet Take 1 tablet (20 mg total) by mouth 2 (two) times daily. 60 tablet 0  . glucose blood (TRUETEST TEST) test  strip Check blood sugar daily 100 each 3  . hydroxychloroquine (PLAQUENIL) 200 MG tablet Take 1 tablet (200 mg total) by mouth 2 (two) times daily. 60 tablet 0  . magnesium oxide (MAG-OX) 400 (241.3 Mg) MG tablet Take 1 tablet (400 mg total) by mouth 3 (three) times daily. (Patient taking differently: Take 400 mg by mouth 2 (two) times daily. ) 90 tablet 1  . potassium chloride SA (KLOR-CON M20) 20 MEQ tablet Take 1 tablet (20 mEq total) by mouth daily. 30 tablet 0  . traMADol (ULTRAM) 50 MG tablet Take 1 tablet (50 mg total) by mouth every 6 (six) hours as needed for moderate pain or severe pain. 30 tablet 0  . warfarin (COUMADIN) 5 MG tablet Take 1 tablet (5 mg total) by mouth daily. 30 tablet 11  . warfarin (COUMADIN) 5 MG tablet Take 1 tablet (5 mg total) by mouth daily at 6 PM.     No current facility-administered medications for this visit.     Allergies  Allergen Reactions  . Miralax [Polyethylene Glycol] Swelling and Rash    Took [facility] brand developed rash. Patient states she tolerated name brand MiraLax in the past.  09/01/15. Patient states she had diffuse swelling including swelling of her lips.   . Vancomycin Rash and Shortness Of Breath  . Acetaminophen Hives  . Oxycodone-Acetaminophen Itching  . Sulfa Antibiotics Rash  All over rash  . Sulfacetamide Sodium Rash    All over rash  . Sulfasalazine Rash    All over rash All over rash  . Banana Other (See Comments)    Unknown  . Latex Rash  . Tape Rash    Past Medical History:  Diagnosis Date  . AAA (abdominal aortic aneurysm) ([place])   . Acute diverticulitis   . Anxiety   . Arthritis   . Atrial fibrillation ([place])    a. Dx 03/2011 - on tikosyn/coumadin.  Marland Kitchen CAD (coronary artery disease)    a. NSTEMI 02/2011: occ mid Cx, DES to OM2, residual nonobst LAD dz.  . Cancer ([place])    breast  . Diabetes mellitus   . Diverticulitis   . Diverticulitis of colon     2008, 04/2011, 12/2014, 08/2015  . Foot deformity 1947   right;  ??scleroderma  . Hemangioma    liver  . HLD (hyperlipidemia)   . HTN (hypertension)   . Osteoporosis   . Tobacco abuse    stopped smoking 2012  . Ureteral obstruction    History of gross hematuria/right hydronephrosis 2/2 to uteropelvic junction obstruction, s/p cystoscopy in January 2007 with bilateral retrograde pyelography, right ureter arthroscopy, right ureteral stent placement, bladder biopsies, stent removal since then.  . Wears dentures    top    Social History   Socioeconomic History  . Marital status: Married    Spouse name: Not on file  . Number of children: 2  . Years of education: Not on file  . Highest education level: Not on file  Occupational History  . Occupation: Research scientist (life sciences)  Social Needs  . Financial resource strain: Not on file  . Food insecurity:    Worry: Not on file    Inability: Not on file  . Transportation needs:    Medical: Not on file    Non-medical: Not on file  Tobacco Use  . Smoking status: Former Smoker    Packs/day: 2.00    Years: 50.00    Pack years: 100.00    Types: Cigarettes    Last attempt to quit: 02/08/2011    Years since quitting: 7.5  . Smokeless tobacco: Never Used  Substance and Sexual Activity  . Alcohol use: No    Alcohol/week: 0.0 standard drinks  . Drug use: No  . Sexual activity: Never  Lifestyle  . Physical activity:    Days per week: Not on file    Minutes per session: Not on file  . Stress: Not on file  Relationships  . Social connections:    Talks on phone: Not on file    Gets together: Not on file    Attends religious service: Not on file    Active member of club or organization: Not on file    Attends meetings of clubs or organizations: Not on file    Relationship status: Not on file  . Intimate partner violence:    Fear of current or ex partner: Not on file    Emotionally abused: Not on file    Physically abused: Not on file    Forced sexual activity: Not on file  Other Topics Concern    . Not on file  Social History Narrative   Takes care 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      Family History  Problem Relation Age of Onset  . Lung cancer Father 61       deceased  . Cancer Father   .  Heart failure Mother 49       deceased  . Heart disease Mother   . Diabetes Brother   . Hypertension Brother   . Cancer Brother        prostate  . Colon cancer Neg Hx   . Liver disease Neg Hx      Review of Systems: General: negative for chills, fever, night sweats or weight changes.  Cardiovascular: negative for chest pain, dyspnea on exertion, edema, orthopnea, palpitations, paroxysmal nocturnal dyspnea or shortness of breath Dermatological: negative for rash Respiratory: negative for cough or wheezing Urologic: negative for hematuria Abdominal: negative for nausea, vomiting, diarrhea, bright red blood per rectum, melena, or hematemesis Neurologic: negative for visual changes, syncope, or dizziness All other systems reviewed and are otherwise negative except as noted above.    Blood pressure 122/76, pulse 85, height 5\' 8"  (1.727 m), weight 156 lb 4 oz (70.9 kg), SpO2 97 %.  General appearance: alert, cooperative, appears older than stated age and no distress Neck: no carotid bruit and no JVD Lungs: clear to auscultation bilaterally Heart: regular rate and rhythm and soft sytolic murmrur AOV Extremities: no edema Skin: pale cool dry Neurologic: Grossly normal  EKG NSR QTc 451  ASSESSMENT AND PLAN:   Bowel perforation (HCC) Colectomy, colostomy 07/25/18  CAD S/P percutaneous coronary angioplasty Circumflex occlusion February 14, 2011 well treated with DES, moderate LAD stenosis, normal EF  PAF (paroxysmal atrial fibrillation) (HCC) NSR on Tikosyn  Dyslipidemia, goal LDL below 70 On Lipitor 40 mg  Chronic anticoagulation Chronic Coumadin  Essential hypertension Controlled  AAA (abdominal aortic aneurysm) without rupture (HCC) 3.8 cm CT Aug 2018   PLAN  I  agree with the patient and told her to stay off Diltiazem. I will check her K+ and Mg++ today (she previously took K+ BID).  If stable follow up with Dr Percival Spanish in 3 months.   Kerin Ransom PA-C 09/08/2018 11:23 AM

## 2018-09-08 NOTE — Progress Notes (Signed)
BP 118/65   Pulse 74   Temp (!) 97.3 F (36.3 C) (Oral)   Ht 5\' 8"  (1.727 m)   Wt 160 lb (72.6 kg)   BMI 24.33 kg/m    Subjective:    Patient ID: Melody Casey, female    DOB: 1946-05-14, 72 y.o.   MRN: [id number]  HPI: Melody Casey is a 72 y.o. female presenting on 09/03/2018 for eye redness (bilateral redness with pain, denies itching or drainage)  Patient comes in with several days bilateral redness of her eyes, with significant drainage and pain.  This morning she states her eyes were together because of the drainage.  She denies any fever or chills.  She has not had any recent colds or sinus infections.  Warm cloths to make it feel better.  Past Medical History:  Diagnosis Date  . AAA (abdominal aortic aneurysm) ([place])   . Acute diverticulitis   . Anxiety   . Arthritis   . Atrial fibrillation ([place])    a. Dx 03/2011 - on tikosyn/coumadin.  Marland Kitchen CAD (coronary artery disease)    a. NSTEMI 02/2011: occ mid Cx, DES to OM2, residual nonobst LAD dz.  . Cancer ([place])    breast  . Diabetes mellitus   . Diverticulitis   . Diverticulitis of colon     2008, 04/2011, 12/2014, 08/2015  . 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 Hemangioma    liver  . HLD (hyperlipidemia)   . HTN (hypertension)   . Osteoporosis   . Tobacco abuse    stopped smoking 2012  . Ureteral obstruction    History of gross hematuria/right hydronephrosis 2/2 to uteropelvic junction obstruction, s/p cystoscopy in January 2007 with bilateral retrograde pyelography, right ureter arthroscopy, right ureteral stent placement, bladder biopsies, stent removal since then.  . Wears dentures    top   Relevant past medical, surgical, family and social history reviewed and updated as indicated. Interim medical history since our last visit reviewed. Allergies and medications reviewed and updated. DATA REVIEWED: CHART IN EPIC  Family History reviewed for pertinent findings.  Review of Systems  Constitutional:  Negative.   HENT: Negative.   Eyes: Positive for pain, discharge and redness.  Respiratory: Negative.   Gastrointestinal: Negative.   Genitourinary: Negative.     Allergies as of 09/03/2018      Reactions   Miralax [polyethylene Glycol] Swelling, Rash   Took [facility] brand developed rash. Patient states she tolerated name brand MiraLax in the past. 09/01/15. Patient states she had diffuse swelling including swelling of her lips.    Vancomycin Rash, Shortness Of Breath   Acetaminophen Hives   Oxycodone-acetaminophen Itching   Sulfa Antibiotics Rash   All over rash   Sulfacetamide Sodium Rash   All over rash   Sulfasalazine Rash   All over rash All over rash   Banana Other (See Comments)   Unknown   Latex Rash   Tape Rash      Medication List        Accurate as of 09/03/18 11:59 PM. Always use your most recent med list.          ALPRAZolam 0.25 MG tablet Commonly known as:  XANAX Take 1 tablet (0.25 mg total) by mouth 3 (three) times daily as needed for anxiety.   amLODipine 2.5 MG tablet Commonly known as:  NORVASC Take 1 tablet (2.5 mg total) by mouth daily. Please call to make appointment for further refills.  anastrozole 1 MG tablet Commonly known as:  ARIMIDEX Take 1 tablet (1 mg total) by mouth daily.   atorvastatin 40 MG tablet Commonly known as:  LIPITOR Take 1 tablet (40 mg total) by mouth daily at 6 PM. Take 1 1/2 tablets daily   diltiazem 360 MG 24 hr capsule Commonly known as:  CARDIZEM CD Take 1 capsule (360 mg total) by mouth daily.   dofetilide 250 MCG capsule Commonly known as:  TIKOSYN Take 1 capsule (250 mcg total) by mouth 2 (two) times daily.   erythromycin ophthalmic ointment Place 1 application into both eyes 3 (three) times daily.   famotidine 20 MG tablet Commonly known as:  PEPCID Take 1 tablet (20 mg total) by mouth 2 (two) times daily.   glucose blood test strip Use as instructed   hydroxychloroquine 200 MG tablet Commonly  known as:  PLAQUENIL Take 1 tablet (200 mg total) by mouth 2 (two) times daily.   magnesium oxide 400 (241.3 Mg) MG tablet Commonly known as:  MAG-OX Take 1 tablet (400 mg total) by mouth 3 (three) times daily.   potassium chloride SA 20 MEQ tablet Commonly known as:  K-DUR,KLOR-CON Take 1 tablet (20 mEq total) by mouth daily.   traMADol 50 MG tablet Commonly known as:  ULTRAM Take 1 tablet (50 mg total) by mouth every 6 (six) hours as needed for moderate pain or severe pain.   warfarin 5 MG tablet Commonly known as:  COUMADIN Take as directed by the anticoagulation clinic. If you are unsure how to take this medication, talk to your nurse or doctor. Original instructions:  Take 1 tablet (5 mg total) by mouth daily.   warfarin 5 MG tablet Commonly known as:  COUMADIN Take as directed by the anticoagulation clinic. If you are unsure how to take this medication, talk to your nurse or doctor. Original instructions:  Take 1 tablet (5 mg total) by mouth daily at 6 PM.          Objective:    BP 118/65   Pulse 74   Temp (!) 97.3 F (36.3 C) (Oral)   Ht 5\' 8"  (1.727 m)   Wt 160 lb (72.6 kg)   BMI 24.33 kg/m   Allergies  Allergen Reactions  . Miralax [Polyethylene Glycol] Swelling and Rash    Took [facility] brand developed rash. Patient states she tolerated name brand MiraLax in the past.  09/01/15. Patient states she had diffuse swelling including swelling of her lips.   . Vancomycin Rash and Shortness Of Breath  . Acetaminophen Hives  . Oxycodone-Acetaminophen Itching  . Sulfa Antibiotics Rash    All over rash  . Sulfacetamide Sodium Rash    All over rash  . Sulfasalazine Rash    All over rash All over rash  . Banana Other (See Comments)    Unknown  . Latex Rash  . Tape Rash    Wt Readings from Last 3 Encounters:  09/08/18 156 lb 4 oz (70.9 kg)  09/03/18 160 lb (72.6 kg)  09/01/18 157 lb (71.2 kg)    Physical Exam  Constitutional: She is oriented to person, place,  and time. She appears well-developed and well-nourished.  HENT:  Head: Normocephalic and atraumatic.  Eyes: Pupils are equal, round, and reactive to light. EOM are normal. Right eye exhibits discharge. Left eye exhibits discharge. Right conjunctiva is injected. Left conjunctiva is injected.  Cardiovascular: Normal rate, regular rhythm, normal heart sounds and intact distal pulses.  Pulmonary/Chest: Effort normal  and breath sounds normal.  Abdominal: Soft. Bowel sounds are normal.  Neurological: She is alert and oriented to person, place, and time. She has normal reflexes.  Skin: Skin is warm and dry. No rash noted.  Psychiatric: She has a normal mood and affect. Her behavior is normal. Judgment and thought content normal.        Assessment & Plan:   1. Acute bacterial conjunctivitis of both eyes - erythromycin ophthalmic ointment; Place 1 application into both eyes 3 (three) times daily.  Dispense: 3.5 g; Refill: 0   Continue all other maintenance medications as listed above.  Follow up plan: No follow-ups on file.  Educational handout given for [place] PA-C [facility] [address]  [place], [place] [address] [phone]   09/08/2018, 1:09 PM

## 2018-09-08 NOTE — Assessment & Plan Note (Signed)
On Lipitor 40 mg

## 2018-09-08 NOTE — Assessment & Plan Note (Signed)
Chronic Coumadin

## 2018-09-08 NOTE — Assessment & Plan Note (Signed)
Controlled.  

## 2018-09-08 NOTE — Assessment & Plan Note (Signed)
3.8 cm CT Aug 2018

## 2018-09-08 NOTE — Patient Instructions (Addendum)
Medication Instructions:   Your physician recommends that you continue on your current medications as directed. Please refer to the Current Medication list given to you today.  Labwork:  Your physician recommends that you return for lab work in: TODAY to check your BMP & Mg levels.  Testing/Procedures:  NONE  Follow-Up:  Your physician recommends that you schedule a follow-up appointment in: 3 months with Dr. Percival Spanish.  Any Other Special Instructions Will Be Listed Below (If Applicable).  If you need a refill on your cardiac medications before your next appointment, please call your pharmacy.

## 2018-09-09 ENCOUNTER — Telehealth: Payer: Self-pay | Admitting: Cardiology

## 2018-09-09 ENCOUNTER — Telehealth: Payer: Self-pay | Admitting: *Deleted

## 2018-09-09 DIAGNOSIS — Z79899 Other long term (current) drug therapy: Secondary | ICD-10-CM

## 2018-09-09 DIAGNOSIS — M069 Rheumatoid arthritis, unspecified: Secondary | ICD-10-CM | POA: Diagnosis not present

## 2018-09-09 DIAGNOSIS — I2510 Atherosclerotic heart disease of native coronary artery without angina pectoris: Secondary | ICD-10-CM | POA: Diagnosis not present

## 2018-09-09 DIAGNOSIS — I482 Chronic atrial fibrillation: Secondary | ICD-10-CM | POA: Diagnosis not present

## 2018-09-09 DIAGNOSIS — I4819 Other persistent atrial fibrillation: Secondary | ICD-10-CM

## 2018-09-09 DIAGNOSIS — E119 Type 2 diabetes mellitus without complications: Secondary | ICD-10-CM | POA: Diagnosis not present

## 2018-09-09 DIAGNOSIS — Z433 Encounter for attention to colostomy: Secondary | ICD-10-CM | POA: Diagnosis not present

## 2018-09-09 DIAGNOSIS — Z48815 Encounter for surgical aftercare following surgery on the digestive system: Secondary | ICD-10-CM | POA: Diagnosis not present

## 2018-09-09 NOTE — Telephone Encounter (Signed)
PATIENT CALLED TO INFORM  OFFICE THAT SHE HAS BEEN TAKING LISINOPRIL 40 MG FOR THE LAST YEAR - THIS MEDICATION WAS NOT ON HER LIST OR HOSPITAL LIST. PATIENT STATES SHE HAS BEEN TAKING MEDICATION SINCE LAST DISCHARGE FROM HOSPITAL  ( PATIENT REVIEWED  WITH PHYSICAL THERAPIST)   BLOOD PRESSURE RANGE -- 126/70, 108/64, 120/66, 106/68,102/62   PATIENT AWARE TO CONTINUE UNTIL FURTHER  NOTICE

## 2018-09-09 NOTE — Telephone Encounter (Signed)
VM from [facility] yesterday afternoon Pt's INR 1.5 Coumadin 5 mg daily

## 2018-09-09 NOTE — Telephone Encounter (Signed)
[facility] aware of new doses for warfarin.

## 2018-09-09 NOTE — Telephone Encounter (Signed)
Pt c/o medication issue: 1. Name of Medication: Lisinopril   2. How are you currently taking this medication (dosage and times per day)?  40 mg 1 x daily   3. Are you having a reaction (difficulty breathing--STAT)? no   4. What is your medication issue? This medication is not on her med list. Her primary Dr gave to her. Pt wants to know if Dr Percival Spanish wants her to keep taking medication. BP numbers are good.

## 2018-09-09 NOTE — Telephone Encounter (Signed)
INR 1.5, Will need to increase warfarin to 7.5 mg Monday and Friday. Continue warfarin 5 mg all other days.  4  Return in 2 weeks with Melody Casey

## 2018-09-09 NOTE — Telephone Encounter (Signed)
Left message for pt to call back  °

## 2018-09-10 DIAGNOSIS — E119 Type 2 diabetes mellitus without complications: Secondary | ICD-10-CM | POA: Diagnosis not present

## 2018-09-10 DIAGNOSIS — M069 Rheumatoid arthritis, unspecified: Secondary | ICD-10-CM | POA: Diagnosis not present

## 2018-09-10 DIAGNOSIS — Z48815 Encounter for surgical aftercare following surgery on the digestive system: Secondary | ICD-10-CM | POA: Diagnosis not present

## 2018-09-10 DIAGNOSIS — Z433 Encounter for attention to colostomy: Secondary | ICD-10-CM | POA: Diagnosis not present

## 2018-09-10 DIAGNOSIS — I482 Chronic atrial fibrillation: Secondary | ICD-10-CM | POA: Diagnosis not present

## 2018-09-10 DIAGNOSIS — I2510 Atherosclerotic heart disease of native coronary artery without angina pectoris: Secondary | ICD-10-CM | POA: Diagnosis not present

## 2018-09-10 NOTE — Telephone Encounter (Signed)
Not sure what else we can do to get an accurate medication list from patients- the CMA reviewed her medications and so did I. I wonder if she didn't confuse Lipitor 40 mg with Lisinopril 40 mg. Please confirm she was taking Lisinopril 40 mg- if she is she needs to cut that back to 20 mg.  Kerin Ransom PA-C 09/10/2018 9:52 AM

## 2018-09-11 ENCOUNTER — Other Ambulatory Visit: Payer: Self-pay

## 2018-09-11 MED ORDER — POTASSIUM CHLORIDE CRYS ER 10 MEQ PO TBCR: 10.0000 meq | EXTENDED_RELEASE_TABLET | Freq: Every day | ORAL | 2 refills | Status: DC

## 2018-09-11 MED ORDER — LISINOPRIL 40 MG PO TABS: 20.0000 mg | ORAL_TABLET | Freq: Every day | ORAL | Status: DC

## 2018-09-11 NOTE — Addendum Note (Signed)
Addended by: Raiford Simmonds on: 09/11/2018 10:18 AM   Modules accepted: Orders

## 2018-09-11 NOTE — Telephone Encounter (Signed)
SPOKE WITH PATIENT .  PATIENT STATED SHE IS TAKING  LISINOPRIL 40 MG DAILY  , SHE DID NOT CONFUSED MEDICATIONS.   RN INFORMED PATIENT TO DECREASE LISINOPRIL TO 20 MG DAILY ( WHICH IS 1/2 TABLET OF 40 MG ) PER [facility]'S ORDERS .   PATIENT STATES UNDERSTANDING AND STATES SHE WOULD LIKE TO KEEP SAME PRESCRIPTION AND TAKE A HALF A TABLET  ( EQUAL 20 MG ) A DAY .   MEDICATION LIST ADJUSTED.

## 2018-09-12 ENCOUNTER — Ambulatory Visit: Payer: Medicare Other | Admitting: Family Medicine

## 2018-09-15 DIAGNOSIS — H04129 Dry eye syndrome of unspecified lacrimal gland: Secondary | ICD-10-CM | POA: Diagnosis not present

## 2018-09-15 DIAGNOSIS — H2513 Age-related nuclear cataract, bilateral: Secondary | ICD-10-CM | POA: Diagnosis not present

## 2018-09-17 DIAGNOSIS — Z433 Encounter for attention to colostomy: Secondary | ICD-10-CM | POA: Diagnosis not present

## 2018-09-17 DIAGNOSIS — I2510 Atherosclerotic heart disease of native coronary artery without angina pectoris: Secondary | ICD-10-CM | POA: Diagnosis not present

## 2018-09-17 DIAGNOSIS — Z48815 Encounter for surgical aftercare following surgery on the digestive system: Secondary | ICD-10-CM | POA: Diagnosis not present

## 2018-09-17 DIAGNOSIS — I482 Chronic atrial fibrillation: Secondary | ICD-10-CM | POA: Diagnosis not present

## 2018-09-17 DIAGNOSIS — M069 Rheumatoid arthritis, unspecified: Secondary | ICD-10-CM | POA: Diagnosis not present

## 2018-09-17 DIAGNOSIS — E119 Type 2 diabetes mellitus without complications: Secondary | ICD-10-CM | POA: Diagnosis not present

## 2018-09-18 DIAGNOSIS — R300 Dysuria: Secondary | ICD-10-CM | POA: Diagnosis not present

## 2018-09-18 DIAGNOSIS — R5383 Other fatigue: Secondary | ICD-10-CM | POA: Diagnosis not present

## 2018-09-18 DIAGNOSIS — I2510 Atherosclerotic heart disease of native coronary artery without angina pectoris: Secondary | ICD-10-CM | POA: Diagnosis not present

## 2018-09-18 DIAGNOSIS — Z6823 Body mass index (BMI) 23.0-23.9, adult: Secondary | ICD-10-CM | POA: Diagnosis not present

## 2018-09-18 DIAGNOSIS — M810 Age-related osteoporosis without current pathological fracture: Secondary | ICD-10-CM | POA: Diagnosis not present

## 2018-09-18 DIAGNOSIS — M0579 Rheumatoid arthritis with rheumatoid factor of multiple sites without organ or systems involvement: Secondary | ICD-10-CM | POA: Diagnosis not present

## 2018-09-18 DIAGNOSIS — I4891 Unspecified atrial fibrillation: Secondary | ICD-10-CM | POA: Diagnosis not present

## 2018-09-18 DIAGNOSIS — C50911 Malignant neoplasm of unspecified site of right female breast: Secondary | ICD-10-CM | POA: Diagnosis not present

## 2018-09-18 DIAGNOSIS — K5780 Diverticulitis of intestine, part unspecified, with perforation and abscess without bleeding: Secondary | ICD-10-CM | POA: Diagnosis not present

## 2018-09-22 DIAGNOSIS — Z433 Encounter for attention to colostomy: Secondary | ICD-10-CM | POA: Diagnosis not present

## 2018-09-22 DIAGNOSIS — I2510 Atherosclerotic heart disease of native coronary artery without angina pectoris: Secondary | ICD-10-CM | POA: Diagnosis not present

## 2018-09-22 DIAGNOSIS — Z48815 Encounter for surgical aftercare following surgery on the digestive system: Secondary | ICD-10-CM | POA: Diagnosis not present

## 2018-09-22 DIAGNOSIS — I482 Chronic atrial fibrillation: Secondary | ICD-10-CM | POA: Diagnosis not present

## 2018-09-22 DIAGNOSIS — Z23 Encounter for immunization: Secondary | ICD-10-CM | POA: Diagnosis not present

## 2018-09-22 DIAGNOSIS — M069 Rheumatoid arthritis, unspecified: Secondary | ICD-10-CM | POA: Diagnosis not present

## 2018-09-22 DIAGNOSIS — E119 Type 2 diabetes mellitus without complications: Secondary | ICD-10-CM | POA: Diagnosis not present

## 2018-09-23 ENCOUNTER — Telehealth: Payer: Self-pay

## 2018-09-23 NOTE — Telephone Encounter (Signed)
Estill Bamberg with [facility] left voice message that patient INR was 1.3. Please advise and when to check again and contact [facility] at [phone]

## 2018-09-24 NOTE — Telephone Encounter (Signed)
VM from [place] w/ [facility] Pt is on 5 mg of Coumadin She had a couple of salads last week, which did cause diarrhea, she will discontinue these.

## 2018-09-24 NOTE — Telephone Encounter (Signed)
Left message to confirm coumadin dose,  5 mg daily?

## 2018-09-24 NOTE — Telephone Encounter (Signed)
Do we know what dose of Coumadin she is on currently

## 2018-09-24 NOTE — Telephone Encounter (Signed)
Estill Bamberg, [place] aware of dose change.

## 2018-09-24 NOTE — Telephone Encounter (Signed)
Have her increase to 1.5 tablets on Monday and Thursday

## 2018-09-25 NOTE — Telephone Encounter (Signed)
Melody Casey aware to repeat INR in 2 weeks

## 2018-09-29 DIAGNOSIS — I482 Chronic atrial fibrillation: Secondary | ICD-10-CM | POA: Diagnosis not present

## 2018-09-29 DIAGNOSIS — M069 Rheumatoid arthritis, unspecified: Secondary | ICD-10-CM | POA: Diagnosis not present

## 2018-09-29 DIAGNOSIS — E119 Type 2 diabetes mellitus without complications: Secondary | ICD-10-CM | POA: Diagnosis not present

## 2018-09-29 DIAGNOSIS — Z433 Encounter for attention to colostomy: Secondary | ICD-10-CM | POA: Diagnosis not present

## 2018-09-29 DIAGNOSIS — I2510 Atherosclerotic heart disease of native coronary artery without angina pectoris: Secondary | ICD-10-CM | POA: Diagnosis not present

## 2018-09-29 DIAGNOSIS — Z48815 Encounter for surgical aftercare following surgery on the digestive system: Secondary | ICD-10-CM | POA: Diagnosis not present

## 2018-10-08 ENCOUNTER — Telehealth: Payer: Self-pay | Admitting: *Deleted

## 2018-10-08 DIAGNOSIS — Z48815 Encounter for surgical aftercare following surgery on the digestive system: Secondary | ICD-10-CM | POA: Diagnosis not present

## 2018-10-08 DIAGNOSIS — M069 Rheumatoid arthritis, unspecified: Secondary | ICD-10-CM | POA: Diagnosis not present

## 2018-10-08 DIAGNOSIS — I4819 Other persistent atrial fibrillation: Secondary | ICD-10-CM

## 2018-10-08 DIAGNOSIS — Z433 Encounter for attention to colostomy: Secondary | ICD-10-CM | POA: Diagnosis not present

## 2018-10-08 DIAGNOSIS — I482 Chronic atrial fibrillation: Secondary | ICD-10-CM | POA: Diagnosis not present

## 2018-10-08 DIAGNOSIS — I2510 Atherosclerotic heart disease of native coronary artery without angina pectoris: Secondary | ICD-10-CM | POA: Diagnosis not present

## 2018-10-08 DIAGNOSIS — E119 Type 2 diabetes mellitus without complications: Secondary | ICD-10-CM | POA: Diagnosis not present

## 2018-10-08 DIAGNOSIS — Z79899 Other long term (current) drug therapy: Secondary | ICD-10-CM

## 2018-10-08 NOTE — Telephone Encounter (Signed)
Melody Casey aware of change in Coumadin dosage and recheck in 2 wks

## 2018-10-08 NOTE — Telephone Encounter (Signed)
Description   INR 1.5 again, Will need to increase warfarin to 7.5 mg Sunday, Monday, Wednesday and Friday. Continue warfarin 5 mg all other days.   Return in 2 weeks with Adalberto Cole, MD [place] 10/08/2018, 1:02 PM

## 2018-10-08 NOTE — Telephone Encounter (Signed)
VM from [facility] w/ [facility] INR 1.5

## 2018-10-08 NOTE — Telephone Encounter (Signed)
LMTCB

## 2018-10-14 DIAGNOSIS — M069 Rheumatoid arthritis, unspecified: Secondary | ICD-10-CM | POA: Diagnosis not present

## 2018-10-14 DIAGNOSIS — Z433 Encounter for attention to colostomy: Secondary | ICD-10-CM | POA: Diagnosis not present

## 2018-10-14 DIAGNOSIS — I2510 Atherosclerotic heart disease of native coronary artery without angina pectoris: Secondary | ICD-10-CM | POA: Diagnosis not present

## 2018-10-14 DIAGNOSIS — I482 Chronic atrial fibrillation: Secondary | ICD-10-CM | POA: Diagnosis not present

## 2018-10-14 DIAGNOSIS — E119 Type 2 diabetes mellitus without complications: Secondary | ICD-10-CM | POA: Diagnosis not present

## 2018-10-14 DIAGNOSIS — Z48815 Encounter for surgical aftercare following surgery on the digestive system: Secondary | ICD-10-CM | POA: Diagnosis not present

## 2018-10-21 ENCOUNTER — Telehealth: Payer: Self-pay | Admitting: *Deleted

## 2018-10-21 DIAGNOSIS — Z48815 Encounter for surgical aftercare following surgery on the digestive system: Secondary | ICD-10-CM | POA: Diagnosis not present

## 2018-10-21 DIAGNOSIS — Z79899 Other long term (current) drug therapy: Secondary | ICD-10-CM

## 2018-10-21 DIAGNOSIS — I4819 Other persistent atrial fibrillation: Secondary | ICD-10-CM

## 2018-10-21 DIAGNOSIS — I2510 Atherosclerotic heart disease of native coronary artery without angina pectoris: Secondary | ICD-10-CM | POA: Diagnosis not present

## 2018-10-21 DIAGNOSIS — Z433 Encounter for attention to colostomy: Secondary | ICD-10-CM | POA: Diagnosis not present

## 2018-10-21 DIAGNOSIS — I482 Chronic atrial fibrillation: Secondary | ICD-10-CM | POA: Diagnosis not present

## 2018-10-21 DIAGNOSIS — M069 Rheumatoid arthritis, unspecified: Secondary | ICD-10-CM | POA: Diagnosis not present

## 2018-10-21 DIAGNOSIS — E119 Type 2 diabetes mellitus without complications: Secondary | ICD-10-CM | POA: Diagnosis not present

## 2018-10-21 NOTE — Telephone Encounter (Signed)
VM from [facility] w[facility] INR 1.8 today

## 2018-10-21 NOTE — Telephone Encounter (Signed)
Aware. 

## 2018-10-21 NOTE — Telephone Encounter (Signed)
Melody Casey called with some questions about incision but then realized she should call Dr Donne Hazel about the opening of the wound, as he saw her last.  She is requesting verbal ok to recert for sixty days for the wound management and stoma care. Approval given, and she will call surgeon to address the opening which is closing again after he enlarged it at the office visit.

## 2018-10-21 NOTE — Telephone Encounter (Signed)
Description   INR 1.5 again, Will need to increase warfarin to 7.5 mg Sunday, Monday, Wednesday, Thursday and Friday. Continue warfarin 5 mg all other days.   Repeat INR in 2 weeks     Increase to 1.5 mg on Thursday as well [name], MD [facility] 10/21/2018, 5:01 PM

## 2018-10-23 DIAGNOSIS — I714 Abdominal aortic aneurysm, without rupture: Secondary | ICD-10-CM | POA: Diagnosis not present

## 2018-10-23 DIAGNOSIS — I252 Old myocardial infarction: Secondary | ICD-10-CM | POA: Diagnosis not present

## 2018-10-23 DIAGNOSIS — M1990 Unspecified osteoarthritis, unspecified site: Secondary | ICD-10-CM | POA: Diagnosis not present

## 2018-10-23 DIAGNOSIS — Z7901 Long term (current) use of anticoagulants: Secondary | ICD-10-CM | POA: Diagnosis not present

## 2018-10-23 DIAGNOSIS — Z955 Presence of coronary angioplasty implant and graft: Secondary | ICD-10-CM | POA: Diagnosis not present

## 2018-10-23 DIAGNOSIS — I2510 Atherosclerotic heart disease of native coronary artery without angina pectoris: Secondary | ICD-10-CM | POA: Diagnosis not present

## 2018-10-23 DIAGNOSIS — Z79899 Other long term (current) drug therapy: Secondary | ICD-10-CM | POA: Diagnosis not present

## 2018-10-23 DIAGNOSIS — Z48815 Encounter for surgical aftercare following surgery on the digestive system: Secondary | ICD-10-CM | POA: Diagnosis not present

## 2018-10-23 DIAGNOSIS — D1803 Hemangioma of intra-abdominal structures: Secondary | ICD-10-CM | POA: Diagnosis not present

## 2018-10-23 DIAGNOSIS — E119 Type 2 diabetes mellitus without complications: Secondary | ICD-10-CM | POA: Diagnosis not present

## 2018-10-23 DIAGNOSIS — M810 Age-related osteoporosis without current pathological fracture: Secondary | ICD-10-CM | POA: Diagnosis not present

## 2018-10-23 DIAGNOSIS — Z5181 Encounter for therapeutic drug level monitoring: Secondary | ICD-10-CM | POA: Diagnosis not present

## 2018-10-23 DIAGNOSIS — Z87891 Personal history of nicotine dependence: Secondary | ICD-10-CM | POA: Diagnosis not present

## 2018-10-23 DIAGNOSIS — I10 Essential (primary) hypertension: Secondary | ICD-10-CM | POA: Diagnosis not present

## 2018-10-23 DIAGNOSIS — I482 Chronic atrial fibrillation, unspecified: Secondary | ICD-10-CM | POA: Diagnosis not present

## 2018-10-23 DIAGNOSIS — M069 Rheumatoid arthritis, unspecified: Secondary | ICD-10-CM | POA: Diagnosis not present

## 2018-10-23 DIAGNOSIS — Z853 Personal history of malignant neoplasm of breast: Secondary | ICD-10-CM | POA: Diagnosis not present

## 2018-10-23 DIAGNOSIS — Z433 Encounter for attention to colostomy: Secondary | ICD-10-CM | POA: Diagnosis not present

## 2018-10-29 DIAGNOSIS — I2510 Atherosclerotic heart disease of native coronary artery without angina pectoris: Secondary | ICD-10-CM | POA: Diagnosis not present

## 2018-10-29 DIAGNOSIS — I482 Chronic atrial fibrillation, unspecified: Secondary | ICD-10-CM | POA: Diagnosis not present

## 2018-10-29 DIAGNOSIS — E119 Type 2 diabetes mellitus without complications: Secondary | ICD-10-CM | POA: Diagnosis not present

## 2018-10-29 DIAGNOSIS — Z48815 Encounter for surgical aftercare following surgery on the digestive system: Secondary | ICD-10-CM | POA: Diagnosis not present

## 2018-10-29 DIAGNOSIS — Z433 Encounter for attention to colostomy: Secondary | ICD-10-CM | POA: Diagnosis not present

## 2018-10-29 DIAGNOSIS — M069 Rheumatoid arthritis, unspecified: Secondary | ICD-10-CM | POA: Diagnosis not present

## 2018-11-03 ENCOUNTER — Telehealth: Payer: Self-pay | Admitting: *Deleted

## 2018-11-03 DIAGNOSIS — I482 Chronic atrial fibrillation, unspecified: Secondary | ICD-10-CM | POA: Diagnosis not present

## 2018-11-03 DIAGNOSIS — M069 Rheumatoid arthritis, unspecified: Secondary | ICD-10-CM | POA: Diagnosis not present

## 2018-11-03 DIAGNOSIS — Z433 Encounter for attention to colostomy: Secondary | ICD-10-CM | POA: Diagnosis not present

## 2018-11-03 DIAGNOSIS — Z48815 Encounter for surgical aftercare following surgery on the digestive system: Secondary | ICD-10-CM | POA: Diagnosis not present

## 2018-11-03 DIAGNOSIS — I2510 Atherosclerotic heart disease of native coronary artery without angina pectoris: Secondary | ICD-10-CM | POA: Diagnosis not present

## 2018-11-03 DIAGNOSIS — E119 Type 2 diabetes mellitus without complications: Secondary | ICD-10-CM | POA: Diagnosis not present

## 2018-11-03 NOTE — Telephone Encounter (Signed)
Melody Casey aware to recheck INR in 4 wks

## 2018-11-03 NOTE — Telephone Encounter (Signed)
Vm from [place] w/ [facility] INR 2.4 today

## 2018-11-03 NOTE — Telephone Encounter (Signed)
Recheck in 4 weeks

## 2018-11-04 ENCOUNTER — Telehealth: Payer: Self-pay | Admitting: *Deleted

## 2018-11-04 NOTE — Telephone Encounter (Signed)
Per pt got her flu shot at [facility].

## 2018-11-05 ENCOUNTER — Telehealth: Payer: Self-pay | Admitting: *Deleted

## 2018-11-05 NOTE — Telephone Encounter (Signed)
VM from [place] w/ [facility] Discussed w/ pt to schedule appt w/ Dr. Warrick Parisian for INR check She was discharged from services today.

## 2018-11-05 NOTE — Telephone Encounter (Signed)
Okay she will need an appointment with [place] within the 4 weeks after her last INR.

## 2018-11-07 NOTE — Telephone Encounter (Signed)
Patient notified, and scheduled for appointment for PT/INR check on 11/28/18 with Dr. Warrick Parisian.

## 2018-11-10 DIAGNOSIS — Z433 Encounter for attention to colostomy: Secondary | ICD-10-CM | POA: Diagnosis not present

## 2018-11-10 DIAGNOSIS — Z5181 Encounter for therapeutic drug level monitoring: Secondary | ICD-10-CM

## 2018-11-10 DIAGNOSIS — D1803 Hemangioma of intra-abdominal structures: Secondary | ICD-10-CM | POA: Diagnosis not present

## 2018-11-10 DIAGNOSIS — I2510 Atherosclerotic heart disease of native coronary artery without angina pectoris: Secondary | ICD-10-CM

## 2018-11-10 DIAGNOSIS — E119 Type 2 diabetes mellitus without complications: Secondary | ICD-10-CM

## 2018-11-10 DIAGNOSIS — Z79899 Other long term (current) drug therapy: Secondary | ICD-10-CM

## 2018-11-10 DIAGNOSIS — Z48815 Encounter for surgical aftercare following surgery on the digestive system: Secondary | ICD-10-CM | POA: Diagnosis not present

## 2018-11-10 DIAGNOSIS — M069 Rheumatoid arthritis, unspecified: Secondary | ICD-10-CM | POA: Diagnosis not present

## 2018-11-10 DIAGNOSIS — M810 Age-related osteoporosis without current pathological fracture: Secondary | ICD-10-CM

## 2018-11-10 DIAGNOSIS — Z955 Presence of coronary angioplasty implant and graft: Secondary | ICD-10-CM

## 2018-11-10 DIAGNOSIS — M1990 Unspecified osteoarthritis, unspecified site: Secondary | ICD-10-CM

## 2018-11-10 DIAGNOSIS — Z853 Personal history of malignant neoplasm of breast: Secondary | ICD-10-CM

## 2018-11-10 DIAGNOSIS — Z87891 Personal history of nicotine dependence: Secondary | ICD-10-CM

## 2018-11-10 DIAGNOSIS — I482 Chronic atrial fibrillation, unspecified: Secondary | ICD-10-CM | POA: Diagnosis not present

## 2018-11-10 DIAGNOSIS — I10 Essential (primary) hypertension: Secondary | ICD-10-CM

## 2018-11-10 DIAGNOSIS — Z7901 Long term (current) use of anticoagulants: Secondary | ICD-10-CM

## 2018-11-10 DIAGNOSIS — I714 Abdominal aortic aneurysm, without rupture: Secondary | ICD-10-CM | POA: Diagnosis not present

## 2018-11-10 DIAGNOSIS — I252 Old myocardial infarction: Secondary | ICD-10-CM | POA: Diagnosis not present

## 2018-11-28 ENCOUNTER — Ambulatory Visit (INDEPENDENT_AMBULATORY_CARE_PROVIDER_SITE_OTHER): Payer: Medicare Other | Admitting: Family Medicine

## 2018-11-28 ENCOUNTER — Encounter: Payer: Self-pay | Admitting: Family Medicine

## 2018-11-28 VITALS — BP 152/80 | HR 59 | Temp 97.1°F | Ht 68.0 in | Wt 166.6 lb

## 2018-11-28 DIAGNOSIS — Z7901 Long term (current) use of anticoagulants: Secondary | ICD-10-CM

## 2018-11-28 DIAGNOSIS — Z79899 Other long term (current) drug therapy: Secondary | ICD-10-CM | POA: Diagnosis not present

## 2018-11-28 DIAGNOSIS — I4819 Other persistent atrial fibrillation: Secondary | ICD-10-CM

## 2018-11-28 LAB — COAGUCHEK XS/INR WAIVED
INR: 2.2 — AB (ref 0.9–1.1)
PROTHROMBIN TIME: 26.1 s

## 2018-11-28 NOTE — Progress Notes (Signed)
BP (!) 152/80   Pulse (!) 59   Temp (!) 97.1 F (36.2 C) (Oral)   Ht 5\' 8"  (1.727 m)   Wt 166 lb 9.6 oz (75.6 kg)   BMI 25.33 kg/m    Subjective:    Patient ID: Melody Casey, female    DOB: 02/20/1946, 72 y.o.   MRN: [id number]  HPI: Melody Casey is a 72 y.o. female presenting on 11/28/2018 for Coagulation Disorder   HPI A. fib and anticoagulation recheck Patient is coming in for A. fib and anticoagulation recheck today.  She says that she has not had any bleeding or palpitations or chest pain.  She did just go through her major operation on her abdomen where she had part of her colon removed and has a colostomy now and has been back on her Coumadin for 2 weeks at least at this dose and needs her INR was good at 2.2 today.  Relevant past medical, surgical, family and social history reviewed and updated as indicated. Interim medical history since our last visit reviewed. Allergies and medications reviewed and updated.  Review of Systems  Constitutional: Negative for chills and fever.  Eyes: Negative for redness and visual disturbance.  Respiratory: Negative for chest tightness and shortness of breath.   Cardiovascular: Negative for chest pain and leg swelling.  Gastrointestinal: Negative for blood in stool.  Genitourinary: Negative for hematuria.  Musculoskeletal: Negative for back pain and gait problem.  Skin: Negative for rash.  Neurological: Negative for light-headedness and headaches.  Psychiatric/Behavioral: Negative for agitation and behavioral problems.  All other systems reviewed and are negative.   Per HPI unless specifically indicated above   Allergies as of 11/28/2018      Reactions   Miralax [polyethylene Glycol] Swelling, Rash   Took [facility] brand developed rash. Patient states she tolerated name brand MiraLax in the past. 09/01/15. Patient states she had diffuse swelling including swelling of her lips.    Vancomycin Rash, Shortness Of Breath   Acetaminophen  Hives   Oxycodone-acetaminophen Itching   Sulfa Antibiotics Rash   All over rash   Sulfacetamide Sodium Rash   All over rash   Sulfasalazine Rash   All over rash All over rash   Banana Other (See Comments)   Unknown   Latex Rash   Tape Rash      Medication List       Accurate as of November 28, 2018 10:59 AM. Always use your most recent med list.        ALPRAZolam 0.25 MG tablet Commonly known as:  XANAX Take 1 tablet (0.25 mg total) by mouth 3 (three) times daily as needed for anxiety.   amLODipine 2.5 MG tablet Commonly known as:  NORVASC Take 1 tablet (2.5 mg total) by mouth daily. Please call to make appointment for further refills.   anastrozole 1 MG tablet Commonly known as:  ARIMIDEX Take 1 tablet (1 mg total) by mouth daily.   atorvastatin 40 MG tablet Commonly known as:  LIPITOR Take 1 tablet (40 mg total) by mouth daily at 6 PM. Take 1 1/2 tablets daily   dofetilide 250 MCG capsule Commonly known as:  TIKOSYN Take 1 capsule (250 mcg total) by mouth 2 (two) times daily.   famotidine 20 MG tablet Commonly known as:  PEPCID Take 1 tablet (20 mg total) by mouth 2 (two) times daily.   folic acid 1 MG tablet Commonly known as:  FOLVITE Take 1 mg by mouth daily.  glucose blood test strip Commonly known as:  TRUETEST TEST Check blood sugar daily   hydroxychloroquine 200 MG tablet Commonly known as:  PLAQUENIL Take 1 tablet (200 mg total) by mouth 2 (two) times daily.   lisinopril 40 MG tablet Commonly known as:  PRINIVIL,ZESTRIL Take 0.5 tablets (20 mg total) by mouth daily.   magnesium oxide 400 (241.3 Mg) MG tablet Commonly known as:  MAG-OX Take 1 tablet (400 mg total) by mouth 3 (three) times daily.   methotrexate (PF) 100 MG/4ML injection   potassium chloride 10 MEQ tablet Commonly known as:  KLOR-CON M20 Take 1 tablet (10 mEq total) by mouth daily.   traMADol 50 MG tablet Commonly known as:  ULTRAM Take 1 tablet (50 mg total) by mouth  every 6 (six) hours as needed for moderate pain or severe pain.   warfarin 5 MG tablet Commonly known as:  COUMADIN Take as directed by the anticoagulation clinic. If you are unsure how to take this medication, talk to your nurse or doctor. Original instructions:  Take 1 tablet (5 mg total) by mouth daily.          Objective:    BP (!) 152/80   Pulse (!) 59   Temp (!) 97.1 F (36.2 C) (Oral)   Ht 5\' 8"  (1.727 m)   Wt 166 lb 9.6 oz (75.6 kg)   BMI 25.33 kg/m   Wt Readings from Last 3 Encounters:  11/28/18 166 lb 9.6 oz (75.6 kg)  09/08/18 156 lb 4 oz (70.9 kg)  09/03/18 160 lb (72.6 kg)    Physical Exam Vitals signs and nursing note reviewed.  Constitutional:      General: She is not in acute distress.    Appearance: She is well-developed. She is not diaphoretic.  Eyes:     Conjunctiva/sclera: Conjunctivae normal.  Musculoskeletal: Normal range of motion.        General: No tenderness.  Skin:    General: Skin is warm and dry.     Findings: No rash.  Neurological:     Mental Status: She is alert and oriented to person, place, and time.     Coordination: Coordination normal.  Psychiatric:        Behavior: Behavior normal.         Assessment & Plan:   Problem List Items Addressed This Visit      Cardiovascular and Mediastinum   Persistent atrial fibrillation     Other   High risk medication use   Chronic anticoagulation - Primary   Relevant Orders   CoaguChek XS/INR Waived      Description   Continue warfarin to 7.5 mg Sunday, Monday, Wednesday, Thursday and Friday. Continue warfarin 5 mg all other days.   Repeat INR in 4 weeks     Follow up plan: Return in about 4 weeks (around 12/26/2018), or if symptoms worsen or fail to improve, for INR recheck in 4 weeks can be with Sharyn Lull or [place].  Counseling provided for all of the vaccine components Orders Placed This Encounter  Procedures  . CoaguChek XS/INR [place], MD [facility] 11/28/2018, 10:59 AM

## 2018-12-09 DIAGNOSIS — M0579 Rheumatoid arthritis with rheumatoid factor of multiple sites without organ or systems involvement: Secondary | ICD-10-CM | POA: Diagnosis not present

## 2018-12-09 DIAGNOSIS — I2510 Atherosclerotic heart disease of native coronary artery without angina pectoris: Secondary | ICD-10-CM | POA: Diagnosis not present

## 2018-12-09 DIAGNOSIS — E663 Overweight: Secondary | ICD-10-CM | POA: Diagnosis not present

## 2018-12-09 DIAGNOSIS — K5780 Diverticulitis of intestine, part unspecified, with perforation and abscess without bleeding: Secondary | ICD-10-CM | POA: Diagnosis not present

## 2018-12-09 DIAGNOSIS — I4891 Unspecified atrial fibrillation: Secondary | ICD-10-CM | POA: Diagnosis not present

## 2018-12-09 DIAGNOSIS — C50911 Malignant neoplasm of unspecified site of right female breast: Secondary | ICD-10-CM | POA: Diagnosis not present

## 2018-12-09 DIAGNOSIS — Z6825 Body mass index (BMI) 25.0-25.9, adult: Secondary | ICD-10-CM | POA: Diagnosis not present

## 2018-12-09 DIAGNOSIS — R5383 Other fatigue: Secondary | ICD-10-CM | POA: Diagnosis not present

## 2018-12-09 DIAGNOSIS — R300 Dysuria: Secondary | ICD-10-CM | POA: Diagnosis not present

## 2018-12-09 DIAGNOSIS — M810 Age-related osteoporosis without current pathological fracture: Secondary | ICD-10-CM | POA: Diagnosis not present

## 2018-12-14 NOTE — Progress Notes (Signed)
Cardiology Office Note   Date:  12/15/2018   ID:  Melody Casey, DOB 1946-09-17, MRN [id number]  PCP:  Dettinger, [name], MD  Cardiologist:   Minus Breeding, MD   Chief Complaint  Patient presents with  . Atrial Fibrillation      History of Present Illness: Melody Casey is a 73 y.o. female who presents for follow up of CAD and PAF.  Since I last saw her she was admitted 07/24/18 with peritonitis due to a perforated diverticulum. 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   She was transferred to rehab on 08/06/18 and discharged from there 08/22/18.  She has a colostomy bag.  She is actually done well from a cardiovascular standpoint.  She denies any cardiovascular symptoms.  She has had no chest pressure, neck or arm discomfort.  She has had no new shortness of breath, PND or orthopnea.  Has had no weight gain or edema.  She getting around with a cane.   Past Medical History:  Diagnosis Date  . AAA (abdominal aortic aneurysm) ([place])   . Acute diverticulitis   . Anxiety   . Arthritis   . Atrial fibrillation ([place])    a. Dx 03/2011 - on tikosyn/coumadin.  Marland Kitchen CAD (coronary artery disease)    a. NSTEMI 02/2011: occ mid Cx, DES to OM2, residual nonobst LAD dz.  . Cancer ([place])    breast  . Diabetes mellitus   . Diverticulitis   . Diverticulitis of colon     2008, 04/2011, 12/2014, 08/2015  . 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 Hemangioma    liver  . HLD (hyperlipidemia)   . HTN (hypertension)   . Osteoporosis   . Tobacco abuse    stopped smoking 2012  . Ureteral obstruction    History of gross hematuria/right hydronephrosis 2/2 to uteropelvic junction obstruction, s/p cystoscopy in January 2007 with bilateral retrograde pyelography, right ureter arthroscopy, right ureteral stent placement, bladder biopsies, stent removal since then.  . Wears dentures    top    Past Surgical History:  Procedure Laterality Date  . BIOPSY N/A 11/01/2015     Procedure: BIOPSY;  Surgeon: Danie Binder, MD;  Location: [facility];  Service: Endoscopy;  Laterality: N/A;  . BREAST LUMPECTOMY WITH NEEDLE LOCALIZATION AND AXILLARY SENTINEL LYMPH NODE BX Right 03/01/2014   Procedure: BREAST LUMPECTOMY WITH NEEDLE LOCALIZATION AND AXILLARY SENTINEL LYMPH NODE BX;  Surgeon: Edward Jolly, MD;  Location: [facility];  Service: General;  Laterality: Right;  . BREAST SURGERY    . CARDIAC CATHETERIZATION  2012   stent  . COLON RESECTION N/A 07/25/2018   Procedure: COLOSTOMY;  Surgeon: Rolm Bookbinder, MD;  Location: [facility];  Service: General;  Laterality: N/A;  . COLONOSCOPY WITH PROPOFOL N/A 06/29/2014   Dr. Wynetta Emery: universal diverticulosis  . CORONARY STENT PLACEMENT  2012  . ESOPHAGOGASTRODUODENOSCOPY (EGD) WITH PROPOFOL N/A 11/01/2015   Procedure: ESOPHAGOGASTRODUODENOSCOPY (EGD) WITH PROPOFOL;  Surgeon: Danie Binder, MD;  Location: [facility];  Service: Endoscopy;  Laterality: N/A;  . KNEE ARTHROSCOPY     left  . LAPAROTOMY N/A 07/25/2018   Procedure: EXPLORATORY LAPAROTOMY;  Surgeon: Rolm Bookbinder, MD;  Location: [facility];  Service: General;  Laterality: N/A;  . PARTIAL COLECTOMY N/A 07/25/2018   Procedure: COLECTOMY;  Surgeon: Rolm Bookbinder, MD;  Location: [facility];  Service: General;  Laterality: N/A;  . Right leg surgery  age 62   As  a child for  ?scleroderma per patient  . TONSILLECTOMY AND ADENOIDECTOMY    . TUBAL LIGATION    . Ureteral surgery  2011   rt ureterostomy-stent     Current Outpatient Medications  Medication Sig Dispense Refill  . ALPRAZolam (XANAX) 0.25 MG tablet Take 1 tablet (0.25 mg total) by mouth 3 (three) times daily as needed for anxiety. 30 tablet 0  . amLODipine (NORVASC) 2.5 MG tablet Take 1 tablet (2.5 mg total) by mouth daily. 90 tablet 3  . anastrozole (ARIMIDEX) 1 MG tablet Take 1 tablet (1 mg total) by mouth daily. 90 tablet 3  . atorvastatin (LIPITOR) 40 MG tablet Take 1 tablet (40 mg total)  by mouth daily at 6 PM. Take 1 1/2 tablets daily 45 tablet 6  . dofetilide (TIKOSYN) 250 MCG capsule Take 1 capsule (250 mcg total) by mouth 2 (two) times daily. 761 capsule 3  . folic acid (FOLVITE) 1 MG tablet Take 1 mg by mouth daily.    Marland Kitchen glucose blood (TRUETEST TEST) test strip Check blood sugar daily 100 each 3  . hydroxychloroquine (PLAQUENIL) 200 MG tablet Take 1 tablet (200 mg total) by mouth 2 (two) times daily. 60 tablet 0  . lisinopril (PRINIVIL,ZESTRIL) 40 MG tablet Take 0.5 tablets (20 mg total) by mouth daily.    . magnesium oxide (MAG-OX) 400 MG tablet Take 400 mg by mouth 2 (two) times daily.    . Methotrexate Sodium (METHOTREXATE, PF,) 100 MG/4ML injection Inject into the muscle once a week.     . potassium chloride (K-DUR,KLOR-CON) 10 MEQ tablet Take 1 tablet (10 mEq total) by mouth daily. 30 tablet 2  . traMADol (ULTRAM) 50 MG tablet Take 1 tablet (50 mg total) by mouth every 6 (six) hours as needed for moderate pain or severe pain. 30 tablet 0  . warfarin (COUMADIN) 5 MG tablet Take 1 tablet (5 mg total) by mouth daily. 30 tablet 11   No current facility-administered medications for this visit.     Allergies:   Miralax [polyethylene glycol]; Vancomycin; Acetaminophen; Oxycodone-acetaminophen; Sulfa antibiotics; Sulfacetamide sodium; Sulfasalazine; Banana; Latex; and Tape    ROS:  Please see the history of present illness.   Otherwise, review of systems are positive for none.   All other systems are reviewed and negative.    PHYSICAL EXAM: VS:  BP 136/64   Pulse 60   Ht 5\' 6"  (1.676 m)   Wt 166 lb 9.6 oz (75.6 kg)   SpO2 96%   BMI 26.89 kg/m  , BMI Body mass index is 26.89 kg/m. GENERAL:  Well appearing NECK:  No jugular venous distention, waveform within normal limits, carotid upstroke brisk and symmetric, no bruits, no thyromegaly LUNGS:  Clear to auscultation bilaterally CHEST:  Unremarkable HEART:  PMI not displaced or sustained,S1 and S2 within normal  limits, no S3, no S4, no clicks, no rubs, no murmurs ABD:  Flat, positive bowel sounds normal in frequency in pitch, no bruits, no rebound, no guarding, no midline pulsatile mass, no hepatomegaly, no splenomegaly, colostomy bag EXT:  2 plus pulses throughout, no edema, no cyanosis no clubbing, chronic congenital right foot clubbing    EKG:  EKG is not ordered today.    Recent Labs: 01/16/2018: TSH 3.570 08/07/2018: ALT 12 08/14/2018: Hemoglobin 8.7; Platelets 337 09/08/2018: BUN 10; Creatinine, Ser 0.66; Magnesium 2.0; Potassium 4.9; Sodium 136    Lipid Panel    Component Value Date/Time   CHOL 145 07/10/2018 1348   TRIG  141 07/25/2018 1326   HDL 50 07/10/2018 1348   CHOLHDL 2.9 07/10/2018 1348   CHOLHDL 6.2 02/14/2011 0330   VLDL 26 02/14/2011 0330   LDLCALC 68 07/10/2018 1348   LDLDIRECT 158.6 04/05/2008 0833      Wt Readings from Last 3 Encounters:  12/15/18 166 lb 9.6 oz (75.6 kg)  11/28/18 166 lb 9.6 oz (75.6 kg)  09/08/18 156 lb 4 oz (70.9 kg)      Other studies Reviewed: Additional studies/ records that were reviewed today include: Hospital records. Review of the above records demonstrates:  Please see elsewhere in the note.     ASSESSMENT AND PLAN:  CAD S/P percutaneous coronary angioplasty The patient has no new sypmtoms.  No further cardiovascular testing is indicated.  We will continue with aggressive risk reduction and meds as listed.  PAF (paroxysmal atrial fibrillation) (HCC) NSR on Tikosyn.  Melody Casey has a CHA2DS2 - VASc score of 5.   She will continue with this.   Dyslipidemia, goal LDL below 70 Continue current therapy.   Essential hypertension The blood pressure is at target. No change in medications is indicated. We will continue with therapeutic lifestyle changes (TLC).  AAA (abdominal aortic aneurysm) without rupture (HCC) 3.8 cm CT Aug 2018.  I will follow up later this year if she has not had a contrasted CT of the abd in that  time.    Current medicines are reviewed at length with the patient today.  The patient does not have concerns regarding medicines.  The following changes have been made:  no change  Labs/ tests ordered today include: None No orders of the defined types were placed in this encounter.    Disposition:   FU with me in six months.     Signed, Minus Breeding, MD  12/15/2018 12:48 PM    [facility]

## 2018-12-15 ENCOUNTER — Ambulatory Visit (INDEPENDENT_AMBULATORY_CARE_PROVIDER_SITE_OTHER): Payer: Medicare Other | Admitting: Cardiology

## 2018-12-15 ENCOUNTER — Telehealth: Payer: Self-pay | Admitting: *Deleted

## 2018-12-15 ENCOUNTER — Encounter: Payer: Self-pay | Admitting: Cardiology

## 2018-12-15 VITALS — BP 136/64 | HR 60 | Ht 66.0 in | Wt 166.6 lb

## 2018-12-15 DIAGNOSIS — I10 Essential (primary) hypertension: Secondary | ICD-10-CM

## 2018-12-15 DIAGNOSIS — I480 Paroxysmal atrial fibrillation: Secondary | ICD-10-CM

## 2018-12-15 DIAGNOSIS — E785 Hyperlipidemia, unspecified: Secondary | ICD-10-CM

## 2018-12-15 MED ORDER — DOFETILIDE 250 MCG PO CAPS: 250.0000 ug | ORAL_CAPSULE | Freq: Two times a day (BID) | ORAL | 3 refills | Status: DC

## 2018-12-15 MED ORDER — AMLODIPINE BESYLATE 2.5 MG PO TABS: 2.5000 mg | ORAL_TABLET | Freq: Every day | ORAL | 3 refills | Status: DC

## 2018-12-15 NOTE — Patient Instructions (Addendum)
Medication Instructions:  Continue current medications  If you need a refill on your cardiac medications before your next appointment, please call your pharmacy.   Lab work: None Ordered If you have labs (blood work) drawn today and your tests are completely normal, you will receive your results only by: Marland Kitchen MyChart Message (if you have MyChart) OR . A paper copy in the mail If you have any lab test that is abnormal or we need to change your treatment, we will call you to review the results.  Testing/Procedures: None Ordered  Follow-Up: At [facility], you and your health needs are our priority.  As part of our continuing mission to provide you with exceptional heart care, we have created designated Provider Care Teams.  These Care Teams include your primary Cardiologist (physician) and Advanced Practice Providers (APPs -  Physician Assistants and Nurse Practitioners) who all work together to provide you with the care you need, when you need it. You will need a follow up appointment in 6 months.  Please call our office 2 months in advance to schedule this appointment.  You may see Minus Breeding, MD or one of the following Advanced Practice Providers on your designated Care Team:   Rosaria Ferries, PA-C . Jory Sims, DNP, ANP

## 2018-12-15 NOTE — Telephone Encounter (Signed)
Tikosyn goes to [facility] fax# [phone] refills

## 2018-12-17 ENCOUNTER — Other Ambulatory Visit: Payer: Self-pay | Admitting: Pharmacist

## 2018-12-17 MED ORDER — DOFETILIDE 250 MCG PO CAPS: 250.0000 ug | ORAL_CAPSULE | Freq: Two times a day (BID) | ORAL | 3 refills | Status: DC

## 2018-12-23 DIAGNOSIS — M1712 Unilateral primary osteoarthritis, left knee: Secondary | ICD-10-CM | POA: Diagnosis not present

## 2019-01-02 ENCOUNTER — Encounter: Payer: Self-pay | Admitting: Family Medicine

## 2019-01-02 ENCOUNTER — Ambulatory Visit (INDEPENDENT_AMBULATORY_CARE_PROVIDER_SITE_OTHER): Payer: Medicare Other | Admitting: Family Medicine

## 2019-01-02 VITALS — BP 179/69 | HR 60 | Temp 97.1°F | Ht 66.0 in | Wt 173.2 lb

## 2019-01-02 DIAGNOSIS — I4819 Other persistent atrial fibrillation: Secondary | ICD-10-CM

## 2019-01-02 DIAGNOSIS — M7751 Other enthesopathy of right foot: Secondary | ICD-10-CM

## 2019-01-02 DIAGNOSIS — Z79899 Other long term (current) drug therapy: Secondary | ICD-10-CM | POA: Diagnosis not present

## 2019-01-02 DIAGNOSIS — Z7901 Long term (current) use of anticoagulants: Secondary | ICD-10-CM

## 2019-01-02 LAB — COAGUCHEK XS/INR WAIVED
INR: 2.7 — ABNORMAL HIGH (ref 0.9–1.1)
Prothrombin Time: 32.8 s

## 2019-01-02 NOTE — Progress Notes (Signed)
BP (!) 179/69   Pulse 60   Temp (!) 97.1 F (36.2 C) (Oral)   Ht 5\' 6"  (1.676 m)   Wt 173 lb 3.2 oz (78.6 kg)   BMI 27.96 kg/m    Subjective:    Patient ID: Melody Casey, female    DOB: 1946-06-26, 73 y.o.   MRN: [id number]  HPI: Melody Casey is a 73 y.o. female presenting on 01/02/2019 for Coagulation Disorder and Foot Pain (right x 1 week)   HPI Patient is coming in with right foot pain for the last week. It started without incident or injury and has not gotten better. She has right heel pain while she is in bed and when she walks. It is swollen medially and laterally with point tenderness on the medial aspect. The pain does not radiate. 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 She has put insoles in her shoes, worn an ace bandage and iced it, but has only gotten relief from Ibuprofen.   Patient denies any signs of bruising or bleeding or palpitations.  Patient is also here for an INR check - today INR is 2.7.  Relevant past medical, surgical, family and social history reviewed and updated as indicated. Interim medical history since our last visit reviewed. Allergies and medications reviewed and updated.  Review of Systems  Respiratory: Negative.   Cardiovascular: Negative.   Gastrointestinal: Negative for blood in stool.  Genitourinary: Negative for hematuria.  Musculoskeletal: Joint swelling: right heel.    Per HPI unless specifically indicated above        Objective:    BP (!) 179/69   Pulse 60   Temp (!) 97.1 F (36.2 C) (Oral)   Ht 5\' 6"  (1.676 m)   Wt 173 lb 3.2 oz (78.6 kg)   BMI 27.96 kg/m   Wt Readings from Last 3 Encounters:  01/02/19 173 lb 3.2 oz (78.6 kg)  12/15/18 166 lb 9.6 oz (75.6 kg)  11/28/18 166 lb 9.6 oz (75.6 kg)    Physical Exam Constitutional:      General: She is not in acute distress. Cardiovascular:     Rate and Rhythm: Normal rate and regular rhythm.  Pulmonary:      Effort: Pulmonary effort is normal.     Breath sounds: Normal breath sounds.  Musculoskeletal:        General: Swelling (right heel) and tenderness present.     Right foot: Decreased range of motion. Tenderness (Tenderness and swelling just anterior to the Achilles tendon on her right ankle no signs of Achilles abnormality or deformity) present.       Assessment & Plan:   Referral to Podiatry for evaluation and injection for bursitis.  Problem List Items Addressed This Visit      Cardiovascular and Mediastinum   Persistent atrial fibrillation     Other   High risk medication use   Chronic anticoagulation - Primary   Relevant Orders   CoaguChek XS/INR Waived (Completed)    Other Visit Diagnoses    Retrocalcaneal bursitis (back of heel), right       Relevant Orders   Ambulatory referral to Podiatry       Description   Continue warfarin to 7.5 mg Sunday, Monday, Wednesday, Thursday and Friday. Continue warfarin 5 mg all other days.   INR 2.7 today, normal range 2.0-3.0     Follow up plan: Return in about 4 weeks (around 01/30/2019),  or if symptoms worsen or fail to improve, for INR and Coumadin recheck.  Counseling provided for all of the vaccine components Orders Placed This Encounter  Procedures  . CoaguChek XS/INR Waived  . Ambulatory referral to [place], PA-[facility] 01/02/2019, 2:47 PM  Patient seen and examined with PA student, agree with assessment and plan above.  Gave patient the same dosing of her Coumadin but if she does going for an injection she is to let us know so we can lower the dose.  She will go see the podiatrist ASAP [name], MD [facility] 01/02/2019, 2:50 PM

## 2019-01-06 DIAGNOSIS — M722 Plantar fascial fibromatosis: Secondary | ICD-10-CM | POA: Diagnosis not present

## 2019-01-06 DIAGNOSIS — M79671 Pain in right foot: Secondary | ICD-10-CM | POA: Diagnosis not present

## 2019-01-07 DIAGNOSIS — H5213 Myopia, bilateral: Secondary | ICD-10-CM | POA: Diagnosis not present

## 2019-01-07 DIAGNOSIS — H524 Presbyopia: Secondary | ICD-10-CM | POA: Diagnosis not present

## 2019-01-07 DIAGNOSIS — H2511 Age-related nuclear cataract, right eye: Secondary | ICD-10-CM | POA: Diagnosis not present

## 2019-01-07 DIAGNOSIS — H2512 Age-related nuclear cataract, left eye: Secondary | ICD-10-CM | POA: Diagnosis not present

## 2019-01-07 DIAGNOSIS — H04129 Dry eye syndrome of unspecified lacrimal gland: Secondary | ICD-10-CM | POA: Diagnosis not present

## 2019-01-07 DIAGNOSIS — D3131 Benign neoplasm of right choroid: Secondary | ICD-10-CM | POA: Diagnosis not present

## 2019-01-10 ENCOUNTER — Telehealth: Payer: Self-pay | Admitting: Family Medicine

## 2019-01-10 NOTE — Telephone Encounter (Signed)
PT has 2 steroid injections in her heel on Tuesday, and was told by Dr Dettinger  to call and let him know when she had these done so he can adjust her warfin.

## 2019-01-11 NOTE — Telephone Encounter (Signed)
Have her take half of her usual dose on Tuesday Wednesday Thursday and Friday and follow up with [place] for an INR recheck sometime the following week either with me or Sharyn Lull

## 2019-01-12 ENCOUNTER — Telehealth: Payer: Self-pay | Admitting: Family Medicine

## 2019-01-12 NOTE — Telephone Encounter (Signed)
lmtcb

## 2019-01-12 NOTE — Telephone Encounter (Signed)
PT can take 2.5 mg (1/2 tablet) on Tuesday and Saturday. And 5 mg (1 tablet)  on all the other days

## 2019-01-12 NOTE — Telephone Encounter (Signed)
Patient aware of recommendations-  Patient takes 5mg  (1 pills) Tuesday & sat 7.5mg  (1 pill and a half) rest of the days   Please clarify how patient should half the does of 7.5 mg.  Covering PCP- please advise

## 2019-01-12 NOTE — Telephone Encounter (Signed)
Pt called

## 2019-01-20 DIAGNOSIS — B351 Tinea unguium: Secondary | ICD-10-CM | POA: Diagnosis not present

## 2019-01-20 DIAGNOSIS — I70203 Unspecified atherosclerosis of native arteries of extremities, bilateral legs: Secondary | ICD-10-CM | POA: Diagnosis not present

## 2019-01-20 DIAGNOSIS — L84 Corns and callosities: Secondary | ICD-10-CM | POA: Diagnosis not present

## 2019-01-20 DIAGNOSIS — M79676 Pain in unspecified toe(s): Secondary | ICD-10-CM | POA: Diagnosis not present

## 2019-01-26 ENCOUNTER — Encounter: Payer: Self-pay | Admitting: Family Medicine

## 2019-01-26 ENCOUNTER — Ambulatory Visit (INDEPENDENT_AMBULATORY_CARE_PROVIDER_SITE_OTHER): Payer: Medicare Other | Admitting: Family Medicine

## 2019-01-26 VITALS — BP 119/54 | HR 65 | Temp 97.3°F | Ht 66.0 in | Wt 173.2 lb

## 2019-01-26 DIAGNOSIS — I4819 Other persistent atrial fibrillation: Secondary | ICD-10-CM | POA: Diagnosis not present

## 2019-01-26 DIAGNOSIS — Z7901 Long term (current) use of anticoagulants: Secondary | ICD-10-CM | POA: Diagnosis not present

## 2019-01-26 DIAGNOSIS — Z79899 Other long term (current) drug therapy: Secondary | ICD-10-CM

## 2019-01-26 LAB — COAGUCHEK XS/INR WAIVED
INR: 1.4 — ABNORMAL HIGH (ref 0.9–1.1)
Prothrombin Time: 14 s

## 2019-01-26 NOTE — Progress Notes (Signed)
BP (!) 119/54   Pulse 65   Temp (!) 97.3 F (36.3 C) (Oral)   Ht 5\' 6"  (1.676 m)   Wt 173 lb 3.2 oz (78.6 kg)   BMI 27.96 kg/m    Subjective:    Patient ID: Melody Casey, female    DOB: 1946-02-05, 73 y.o.   MRN: [id number]  HPI: Melody Casey is a 73 y.o. female presenting on 01/26/2019 for Coagulation Disorder   HPI Coumadin recheck Patient is coming in for Coumadin recheck, she denies any bleeding, she had lowered her dose because of some injections last week.  She denies any palpitations or flutters.  Relevant past medical, surgical, family and social history reviewed and updated as indicated. Interim medical history since our last visit reviewed. Allergies and medications reviewed and updated.  Review of Systems  Constitutional: Negative for chills and fever.  Eyes: Negative for redness and visual disturbance.  Respiratory: Negative for chest tightness and shortness of breath.   Cardiovascular: Negative for chest pain and leg swelling.  Musculoskeletal: Negative for back pain and gait problem.  Skin: Negative for rash.  Neurological: Negative for light-headedness and headaches.  Psychiatric/Behavioral: Negative for agitation and behavioral problems.  All other systems reviewed and are negative.   Per HPI unless specifically indicated above     Objective:    BP (!) 119/54   Pulse 65   Temp (!) 97.3 F (36.3 C) (Oral)   Ht 5\' 6"  (1.676 m)   Wt 173 lb 3.2 oz (78.6 kg)   BMI 27.96 kg/m   Wt Readings from Last 3 Encounters:  01/26/19 173 lb 3.2 oz (78.6 kg)  01/02/19 173 lb 3.2 oz (78.6 kg)  12/15/18 166 lb 9.6 oz (75.6 kg)    Physical Exam Vitals signs and nursing note reviewed.  Constitutional:      General: She is not in acute distress.    Appearance: She is well-developed. She is not diaphoretic.  Eyes:     Conjunctiva/sclera: Conjunctivae normal.  Cardiovascular:     Rate and Rhythm: Normal rate and regular rhythm.     Heart sounds: Normal heart  sounds. No murmur.  Pulmonary:     Effort: Pulmonary effort is normal. No respiratory distress.     Breath sounds: Normal breath sounds. No wheezing.  Skin:    General: Skin is warm and dry.     Findings: No rash.  Neurological:     Mental Status: She is alert and oriented to person, place, and time.     Coordination: Coordination normal.  Psychiatric:        Behavior: Behavior normal.     Description   Patient had decreased her dose because of some steroid injections and that is why her levels are low, we will put her back on her previous dosing of warfarin to 7.5 mg Sunday, Monday, Wednesday, Thursday and Friday. Continue warfarin 5 mg all other days.   INR 1.2 today, normal range 2.0-3.0        Assessment & Plan:   Problem List Items Addressed This Visit      Cardiovascular and Mediastinum   Persistent atrial fibrillation     Other   High risk medication use   Chronic anticoagulation - Primary   Relevant Orders   CoaguChek XS/INR Waived (Completed)       Follow up plan: Return in about 4 weeks (around 02/23/2019), or if symptoms worsen or fail to improve, for Coumadin recheck.  Counseling provided  for all of the vaccine components Orders Placed This Encounter  Procedures  . CoaguChek XS/INR [place], MD [facility] 01/26/2019, 4:56 PM

## 2019-02-02 ENCOUNTER — Ambulatory Visit: Payer: Medicare Other | Admitting: Family Medicine

## 2019-03-04 ENCOUNTER — Other Ambulatory Visit: Payer: Self-pay

## 2019-03-04 MED ORDER — ATORVASTATIN CALCIUM 40 MG PO TABS: 40.0000 mg | ORAL_TABLET | Freq: Every day | ORAL | 6 refills | Status: DC

## 2019-03-09 ENCOUNTER — Other Ambulatory Visit: Payer: Self-pay

## 2019-03-09 DIAGNOSIS — I2510 Atherosclerotic heart disease of native coronary artery without angina pectoris: Secondary | ICD-10-CM | POA: Diagnosis not present

## 2019-03-09 DIAGNOSIS — I4891 Unspecified atrial fibrillation: Secondary | ICD-10-CM | POA: Diagnosis not present

## 2019-03-09 DIAGNOSIS — K5780 Diverticulitis of intestine, part unspecified, with perforation and abscess without bleeding: Secondary | ICD-10-CM | POA: Diagnosis not present

## 2019-03-09 DIAGNOSIS — Z6827 Body mass index (BMI) 27.0-27.9, adult: Secondary | ICD-10-CM | POA: Diagnosis not present

## 2019-03-09 DIAGNOSIS — R5383 Other fatigue: Secondary | ICD-10-CM | POA: Diagnosis not present

## 2019-03-09 DIAGNOSIS — C50911 Malignant neoplasm of unspecified site of right female breast: Secondary | ICD-10-CM | POA: Diagnosis not present

## 2019-03-09 DIAGNOSIS — E663 Overweight: Secondary | ICD-10-CM | POA: Diagnosis not present

## 2019-03-09 DIAGNOSIS — R300 Dysuria: Secondary | ICD-10-CM | POA: Diagnosis not present

## 2019-03-09 DIAGNOSIS — M810 Age-related osteoporosis without current pathological fracture: Secondary | ICD-10-CM | POA: Diagnosis not present

## 2019-03-09 DIAGNOSIS — M0579 Rheumatoid arthritis with rheumatoid factor of multiple sites without organ or systems involvement: Secondary | ICD-10-CM | POA: Diagnosis not present

## 2019-03-09 MED ORDER — ATORVASTATIN CALCIUM 40 MG PO TABS: 40.0000 mg | ORAL_TABLET | Freq: Every day | ORAL | 6 refills | Status: DC

## 2019-03-10 ENCOUNTER — Encounter: Payer: Self-pay | Admitting: Family Medicine

## 2019-03-10 ENCOUNTER — Ambulatory Visit (INDEPENDENT_AMBULATORY_CARE_PROVIDER_SITE_OTHER): Payer: Medicare Other | Admitting: Family Medicine

## 2019-03-10 VITALS — BP 127/58 | HR 64 | Temp 97.5°F | Ht 66.0 in | Wt 180.6 lb

## 2019-03-10 DIAGNOSIS — I4819 Other persistent atrial fibrillation: Secondary | ICD-10-CM | POA: Diagnosis not present

## 2019-03-10 DIAGNOSIS — Z7901 Long term (current) use of anticoagulants: Secondary | ICD-10-CM | POA: Diagnosis not present

## 2019-03-10 DIAGNOSIS — Z79899 Other long term (current) drug therapy: Secondary | ICD-10-CM | POA: Diagnosis not present

## 2019-03-10 LAB — COAGUCHEK XS/INR WAIVED
INR: 2.7 — ABNORMAL HIGH (ref 0.9–1.1)
Prothrombin Time: 32.6 s

## 2019-03-10 MED ORDER — LISINOPRIL 20 MG PO TABS: 20.0000 mg | ORAL_TABLET | Freq: Every day | ORAL | 1 refills | Status: DC

## 2019-03-10 NOTE — Progress Notes (Signed)
BP (!) 127/58   Pulse 64   Temp (!) 97.5 F (36.4 C) (Oral)   Ht 5\' 6"  (1.676 m)   Wt 180 lb 9.6 oz (81.9 kg)   BMI 29.15 kg/m    Subjective:   Patient ID: Melody Casey, female    DOB: 1946/07/12, 73 y.o.   MRN: [id number]  HPI: Melody Casey is a 73 y.o. female presenting on 03/10/2019 for Coagulation Disorder and Bloated (Patient states she has been gaining weight)   HPI A. fib and chronic anticoagulation Patient denies any bruising or bleeding and has been doing better on her current dose and is coming in for recheck today.  INR is 2.7.  Patient is concerned that she is been gaining weight although she has been more homebound with the coronavirus and stay at home and she has not been as active.  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 Donne Hazel for this.  She denies any abdominal pain or fevers or chills or diarrhea or constipation currently.  Relevant past medical, surgical, family and social history reviewed and updated as indicated. Interim medical history since our last visit reviewed. Allergies and medications reviewed and updated.  Review of Systems  Constitutional: Positive for unexpected weight change. Negative for chills and fever.  Eyes: Negative for visual disturbance.  Respiratory: Negative for chest tightness and shortness of breath.   Cardiovascular: Negative for chest pain and leg swelling.  Gastrointestinal: Negative for blood in stool.  Genitourinary: Negative for vaginal bleeding.  Musculoskeletal: Negative for back pain and gait problem.  Skin: Negative for rash.  Neurological: Negative for light-headedness and headaches.  Psychiatric/Behavioral: Negative for agitation and behavioral problems.  All other systems reviewed and are negative.   Per HPI unless specifically indicated above   Allergies as of 03/10/2019      Reactions   Miralax [polyethylene Glycol] Swelling, Rash   Took [facility] brand  developed rash. Patient states she tolerated name brand MiraLax in the past. 09/01/15. Patient states she had diffuse swelling including swelling of her lips.    Vancomycin Rash, Shortness Of Breath   Acetaminophen Hives   Oxycodone-acetaminophen Itching   Sulfa Antibiotics Rash   All over rash   Sulfacetamide Sodium Rash   All over rash   Sulfasalazine Rash   All over rash All over rash   Banana Other (See Comments)   Unknown   Latex Rash   Tape Rash      Medication List       Accurate as of March 10, 2019  2:11 PM. Always use your most recent med list.        ALPRAZolam 0.25 MG tablet Commonly known as:  XANAX Take 1 tablet (0.25 mg total) by mouth 3 (three) times daily as needed for anxiety.   amLODipine 2.5 MG tablet Commonly known as:  NORVASC Take 1 tablet (2.5 mg total) by mouth daily.   anastrozole 1 MG tablet Commonly known as:  ARIMIDEX Take 1 tablet (1 mg total) by mouth daily.   dofetilide 250 MCG capsule Commonly known as:  Tikosyn Take 1 capsule (250 mcg total) by mouth 2 (two) times daily.   folic acid 1 MG tablet Commonly known as:  FOLVITE Take 1 mg by mouth daily.   glucose blood test strip Commonly known as:  TRUEtest Test Check blood sugar daily   hydroxychloroquine 200 MG tablet Commonly known as:  PLAQUENIL Take 1 tablet (200 mg total) by mouth 2 (two) times daily.   lisinopril 20 MG tablet Commonly known as:  PRINIVIL,ZESTRIL Take 1 tablet (20 mg total) by mouth daily.   magnesium oxide 400 MG tablet Commonly known as:  MAG-OX Take 400 mg by mouth 2 (two) times daily.   methotrexate (PF) 100 MG/4ML injection Inject into the muscle once a week.   potassium chloride 10 MEQ tablet Commonly known as:  Klor-Con M20 Take 1 tablet (10 mEq total) by mouth daily.   traMADol 50 MG tablet Commonly known as:  ULTRAM Take 1 tablet (50 mg total) by mouth every 6 (six) hours as needed for moderate pain or severe pain.   warfarin 5 MG  tablet Commonly known as:  Coumadin Take as directed by the anticoagulation clinic. If you are unsure how to take this medication, talk to your nurse or doctor. Original instructions:  Take 1 tablet (5 mg total) by mouth daily.        Objective:   BP (!) 127/58   Pulse 64   Temp (!) 97.5 F (36.4 C) (Oral)   Ht 5\' 6"  (1.676 m)   Wt 180 lb 9.6 oz (81.9 kg)   BMI 29.15 kg/m   Wt Readings from Last 3 Encounters:  03/10/19 180 lb 9.6 oz (81.9 kg)  01/26/19 173 lb 3.2 oz (78.6 kg)  01/02/19 173 lb 3.2 oz (78.6 kg)    Physical Exam Vitals signs and nursing note reviewed.  Constitutional:      General: She is not in acute distress.    Appearance: She is well-developed. She is not diaphoretic.  Eyes:     Conjunctiva/sclera: Conjunctivae normal.  Cardiovascular:     Rate and Rhythm: Normal rate and regular rhythm.     Heart sounds: Normal heart sounds. No murmur.  Pulmonary:     Effort: Pulmonary effort is normal. No respiratory distress.     Breath sounds: Normal breath sounds. No wheezing.  Abdominal:     General: Abdomen is flat. There is no distension.     Palpations: There is no mass.     Tenderness: There is no abdominal tenderness. There is no guarding or rebound.     Hernia: No hernia is present.     Comments: Ostomy bag in place and nontender  Neurological:     Mental Status: She is alert and oriented to person, place, and time.     Coordination: Coordination normal.  Psychiatric:        Behavior: Behavior normal.     Description   INR good, continue dosing of warfarin to 7.5 mg Sunday, Monday, Wednesday, Thursday and Friday. Continue warfarin 5 mg all other days.   INR 2.7 today, normal range 2.0-3.0      Assessment & Plan:   Problem List Items Addressed This Visit      Cardiovascular and Mediastinum   Persistent atrial fibrillation   Relevant Medications   lisinopril (PRINIVIL,ZESTRIL) 20 MG tablet     Other   High risk medication use   Chronic  anticoagulation - Primary   Relevant Orders   CoaguChek XS/INR Waived       Follow up plan: Return in about 4 weeks (around 04/07/2019), or if symptoms worsen or fail to improve, for INR recheck.  Counseling provided for all of the vaccine components Orders Placed This Encounter  Procedures  . CoaguChek XS/INR [place], MD [facility] 03/10/2019, 2:11  PM

## 2019-03-11 ENCOUNTER — Other Ambulatory Visit: Payer: Self-pay

## 2019-03-12 ENCOUNTER — Telehealth: Payer: Self-pay | Admitting: Cardiology

## 2019-03-12 NOTE — Telephone Encounter (Signed)
Will route to MD to advise if the Lipitor should be on the patients medication list.   Please advise, thanks!

## 2019-03-12 NOTE — Telephone Encounter (Signed)
New Message    Pt c/o medication issue:  1. Name of Medication: lipitor  2. How are you currently taking this medication (dosage and times per day)? 1 tablet by mouth at 6pm and 1/2 tablet daily   3. Are you having a reaction (difficulty breathing--STAT)?   4. What is your medication issue? [facility] is calling to get clarification on how the patient should be taking this medication. Please call to discuss and refer to reference number [id number]

## 2019-03-12 NOTE — Telephone Encounter (Signed)
I don't  See Lipitor (atorvsatatin) on this patient's record.

## 2019-03-12 NOTE — Telephone Encounter (Signed)
Can you please advise of the dosage of the Lipitor??   Sig states- 1 tab daily at 6 and 1/2 tab daily.   The last OV note has it has 1 1/2 tab. So unsure. Please clarify?  Thanks!

## 2019-03-16 NOTE — Telephone Encounter (Signed)
Pt stated she is not taking Lipitor and she don't have any recent lipid done, pt want to know if she should be on this medication or not

## 2019-03-16 NOTE — Telephone Encounter (Signed)
Leave message for pt to call back 

## 2019-03-16 NOTE — Telephone Encounter (Signed)
Tell her we will get back to this question after the virus is over.  This is not the appropriate time to start this.

## 2019-03-16 NOTE — Telephone Encounter (Signed)
Please clarify with the patient what she is taking.  Please see if you can find for me the most recent lipids.

## 2019-03-24 DIAGNOSIS — M0579 Rheumatoid arthritis with rheumatoid factor of multiple sites without organ or systems involvement: Secondary | ICD-10-CM | POA: Diagnosis not present

## 2019-03-31 DIAGNOSIS — I70203 Unspecified atherosclerosis of native arteries of extremities, bilateral legs: Secondary | ICD-10-CM | POA: Diagnosis not present

## 2019-03-31 DIAGNOSIS — M79676 Pain in unspecified toe(s): Secondary | ICD-10-CM | POA: Diagnosis not present

## 2019-03-31 DIAGNOSIS — L84 Corns and callosities: Secondary | ICD-10-CM | POA: Diagnosis not present

## 2019-03-31 DIAGNOSIS — B351 Tinea unguium: Secondary | ICD-10-CM | POA: Diagnosis not present

## 2019-04-03 ENCOUNTER — Other Ambulatory Visit: Payer: Self-pay

## 2019-04-06 ENCOUNTER — Other Ambulatory Visit: Payer: Self-pay

## 2019-04-06 ENCOUNTER — Encounter: Payer: Self-pay | Admitting: Family Medicine

## 2019-04-06 ENCOUNTER — Ambulatory Visit (INDEPENDENT_AMBULATORY_CARE_PROVIDER_SITE_OTHER): Payer: Medicare Other | Admitting: Family Medicine

## 2019-04-06 VITALS — BP 116/59 | HR 62 | Temp 97.4°F | Ht 66.0 in

## 2019-04-06 DIAGNOSIS — Z7901 Long term (current) use of anticoagulants: Secondary | ICD-10-CM | POA: Diagnosis not present

## 2019-04-06 DIAGNOSIS — I4819 Other persistent atrial fibrillation: Secondary | ICD-10-CM | POA: Diagnosis not present

## 2019-04-06 LAB — COAGUCHEK XS/INR WAIVED
INR: 2.5 — ABNORMAL HIGH (ref 0.9–1.1)
Prothrombin Time: 30.1 s

## 2019-04-06 NOTE — Progress Notes (Signed)
BP (!) 116/59   Pulse 62   Temp (!) 97.4 F (36.3 C) (Oral)   Ht 5\' 6"  (1.676 m)   BMI 29.15 kg/m    Subjective:   Patient ID: Melody Casey, female    DOB: 28-Jul-1946, 73 y.o.   MRN: [id number]  HPI: Melody Casey is a 73 y.o. female presenting on 04/06/2019 for Coagulation Disorder   HPI Patient is coming in for INR and Coumadin recheck today.  She is currently been taking the Coumadin for her paroxysmal A. fib and denies any bruising or bleeding or palpitations and says everything is been going well with it.  She has an INR of 2.5 today.  She has been stable on this.  Relevant past medical, surgical, family and social history reviewed and updated as indicated. Interim medical history since our last visit reviewed. Allergies and medications reviewed and updated.  Review of Systems  Constitutional: Negative for chills and fever.  Respiratory: Negative for chest tightness and shortness of breath.   Cardiovascular: Negative for chest pain and leg swelling.  Gastrointestinal: Negative for blood in stool.  Genitourinary: Negative for hematuria.  Skin: Negative for color change.  Neurological: Negative for dizziness, light-headedness and headaches.  All other systems reviewed and are negative.   Per HPI unless specifically indicated above   Allergies as of 04/06/2019      Reactions   Miralax [polyethylene Glycol] Swelling, Rash   Took [facility] brand developed rash. Patient states she tolerated name brand MiraLax in the past. 09/01/15. Patient states she had diffuse swelling including swelling of her lips.    Vancomycin Rash, Shortness Of Breath   Acetaminophen Hives   Oxycodone-acetaminophen Itching   Sulfa Antibiotics Rash   All over rash   Sulfacetamide Sodium Rash   All over rash   Sulfasalazine Rash   All over rash All over rash   Banana Other (See Comments)   Unknown   Latex Rash   Tape Rash      Medication List       Accurate as of April 06, 2019 11:06 AM.  Always use your most recent med list.        ALPRAZolam 0.25 MG tablet Commonly known as:  XANAX Take 1 tablet (0.25 mg total) by mouth 3 (three) times daily as needed for anxiety.   amLODipine 2.5 MG tablet Commonly known as:  NORVASC Take 1 tablet (2.5 mg total) by mouth daily.   anastrozole 1 MG tablet Commonly known as:  ARIMIDEX Take 1 tablet (1 mg total) by mouth daily.   dofetilide 250 MCG capsule Commonly known as:  Tikosyn Take 1 capsule (250 mcg total) by mouth 2 (two) times daily.   folic acid 1 MG tablet Commonly known as:  FOLVITE Take 1 mg by mouth daily.   glucose blood test strip Commonly known as:  TRUEtest Test Check blood sugar daily   hydroxychloroquine 200 MG tablet Commonly known as:  PLAQUENIL Take 1 tablet (200 mg total) by mouth 2 (two) times daily.   lisinopril 20 MG tablet Commonly known as:  ZESTRIL Take 1 tablet (20 mg total) by mouth daily.   magnesium oxide 400 MG tablet Commonly known as:  MAG-OX Take 400 mg by mouth 2 (two) times daily.   methotrexate (PF) 100 MG/4ML injection Inject into the muscle once a week.   potassium chloride 10 MEQ tablet Commonly known as:  Klor-Con M20 Take 1 tablet (10 mEq total) by mouth daily.  traMADol 50 MG tablet Commonly known as:  ULTRAM Take 1 tablet (50 mg total) by mouth every 6 (six) hours as needed for moderate pain or severe pain.   warfarin 5 MG tablet Commonly known as:  Coumadin Take as directed by the anticoagulation clinic. If you are unsure how to take this medication, talk to your nurse or doctor. Original instructions:  Take 1 tablet (5 mg total) by mouth daily.        Objective:   BP (!) 116/59   Pulse 62   Temp (!) 97.4 F (36.3 C) (Oral)   Ht 5\' 6"  (1.676 m)   BMI 29.15 kg/m   Wt Readings from Last 3 Encounters:  03/10/19 180 lb 9.6 oz (81.9 kg)  01/26/19 173 lb 3.2 oz (78.6 kg)  01/02/19 173 lb 3.2 oz (78.6 kg)    Physical Exam Vitals signs and nursing note  reviewed.  Constitutional:      General: She is not in acute distress.    Appearance: She is well-developed. She is not diaphoretic.  Eyes:     Conjunctiva/sclera: Conjunctivae normal.  Skin:    General: Skin is warm and dry.     Findings: No bruising.  Neurological:     Mental Status: She is alert and oriented to person, place, and time.     Coordination: Coordination normal.  Psychiatric:        Behavior: Behavior normal.     Description   INR good, continue dosing of warfarin to 7.5 mg Sunday, Monday, Wednesday, Thursday and Friday. Continue warfarin 5 mg all other days.   INR 2.5 today, normal range 2.0-3.0      Assessment & Plan:   Problem List Items Addressed This Visit      Cardiovascular and Mediastinum   Persistent atrial fibrillation     Other   Chronic anticoagulation - Primary   Relevant Orders   CoaguChek XS/INR Waived      Will try for home INR, patient has been on Coumadin since at least April 2012 that we can find in her records Follow up plan: Return in about 4 weeks (around 05/04/2019), or if symptoms worsen or fail to improve, for INR recheck.  Counseling provided for all of the vaccine components Orders Placed This Encounter  Procedures  . CoaguChek XS/INR [place], MD [facility] 04/06/2019, 11:06 AM

## 2019-04-13 DIAGNOSIS — I480 Paroxysmal atrial fibrillation: Secondary | ICD-10-CM | POA: Diagnosis not present

## 2019-04-13 NOTE — Telephone Encounter (Signed)
Pt made aware

## 2019-04-14 ENCOUNTER — Telehealth: Payer: Self-pay

## 2019-04-14 NOTE — Telephone Encounter (Signed)
Continue current dose.

## 2019-04-14 NOTE — Telephone Encounter (Signed)
mdINR INR test result 3.2 Current dose:: 5 mg on Tuesday and Saturday 7.5 mg on Sunday, Monday, Wednesday, Thursday and Friday Please advise patient of dose changes

## 2019-04-14 NOTE — Telephone Encounter (Signed)
Pt aware - will recheck next week

## 2019-04-27 DIAGNOSIS — M0579 Rheumatoid arthritis with rheumatoid factor of multiple sites without organ or systems involvement: Secondary | ICD-10-CM | POA: Diagnosis not present

## 2019-05-05 ENCOUNTER — Other Ambulatory Visit: Payer: Self-pay

## 2019-05-05 ENCOUNTER — Ambulatory Visit (INDEPENDENT_AMBULATORY_CARE_PROVIDER_SITE_OTHER): Payer: Medicare Other | Admitting: Family Medicine

## 2019-05-05 ENCOUNTER — Encounter: Payer: Self-pay | Admitting: Family Medicine

## 2019-05-05 VITALS — BP 129/69 | HR 59 | Temp 97.5°F | Ht 66.0 in | Wt 184.6 lb

## 2019-05-05 DIAGNOSIS — R300 Dysuria: Secondary | ICD-10-CM | POA: Diagnosis not present

## 2019-05-05 DIAGNOSIS — N3000 Acute cystitis without hematuria: Secondary | ICD-10-CM | POA: Diagnosis not present

## 2019-05-05 DIAGNOSIS — Z79899 Other long term (current) drug therapy: Secondary | ICD-10-CM

## 2019-05-05 DIAGNOSIS — I4819 Other persistent atrial fibrillation: Secondary | ICD-10-CM

## 2019-05-05 LAB — URINALYSIS
Bilirubin, UA: NEGATIVE
Glucose, UA: NEGATIVE
Ketones, UA: NEGATIVE
Nitrite, UA: POSITIVE — AB
Protein,UA: NEGATIVE
Specific Gravity, UA: 1.025 (ref 1.005–1.030)
Urobilinogen, Ur: 0.2 mg/dL (ref 0.2–1.0)
pH, UA: 6 (ref 5.0–7.5)

## 2019-05-05 LAB — COAGUCHEK XS/INR WAIVED
INR: 2.1 — ABNORMAL HIGH (ref 0.9–1.1)
Prothrombin Time: 25.2 s

## 2019-05-05 MED ORDER — CEPHALEXIN 500 MG PO CAPS: 500.0000 mg | ORAL_CAPSULE | Freq: Four times a day (QID) | ORAL | 0 refills | Status: DC

## 2019-05-05 NOTE — Progress Notes (Signed)
BP 129/69   Pulse (!) 59   Temp (!) 97.5 F (36.4 C) (Oral)   Ht 5\' 6"  (1.676 m)   Wt 184 lb 9.6 oz (83.7 kg)   BMI 29.80 kg/m    Subjective:   Patient ID: Melody Casey, female    DOB: 10-20-1946, 73 y.o.   MRN: [id number]  HPI: Melody Casey is a 73 y.o. female presenting on 05/05/2019 for Urinary Frequency (x 1 week) and anal discharge (x 1 week- Grey mucus )   HPI Urinary frequency and dysuria Patient comes in complaining of urinary frequency and dysuria this been going on for about a week.  She has had some mucousy discharge on her anus but she does have the bowel issues and that is part of that and has discussed that with gastroenterology already.  She says she has had the frequency of burning.  Patient is coming in for A. fib and Coumadin recheck.  She did try with the home machines but she is not doing that because of expense.  She needs to have it checked today.  Her last INR was 3.1.  She denies any major bruising or bleeding.  Relevant past medical, surgical, family and social history reviewed and updated as indicated. Interim medical history since our last visit reviewed. Allergies and medications reviewed and updated.  Review of Systems  Constitutional: Negative for chills and fever.  Eyes: Negative for visual disturbance.  Respiratory: Negative for chest tightness and shortness of breath.   Cardiovascular: Negative for chest pain and leg swelling.  Gastrointestinal: Negative for abdominal pain.  Genitourinary: Positive for dysuria, frequency and urgency. Negative for difficulty urinating, hematuria, vaginal bleeding, vaginal discharge and vaginal pain.  Musculoskeletal: Negative for back pain and gait problem.  Skin: Negative for rash.  Neurological: Negative for light-headedness and headaches.  Psychiatric/Behavioral: Negative for agitation and behavioral problems.  All other systems reviewed and are negative.   Per HPI unless specifically indicated above    Allergies as of 05/05/2019      Reactions   Miralax [polyethylene Glycol] Swelling, Rash   Took [facility] brand developed rash. Patient states she tolerated name brand MiraLax in the past. 09/01/15. Patient states she had diffuse swelling including swelling of her lips.    Vancomycin Rash, Shortness Of Breath   Acetaminophen Hives   Oxycodone-acetaminophen Itching   Sulfa Antibiotics Rash   All over rash   Sulfacetamide Sodium Rash   All over rash   Sulfasalazine Rash   All over rash All over rash   Banana Other (See Comments)   Unknown   Latex Rash   Tape Rash      Medication List       Accurate as of May 05, 2019  2:24 PM. If you have any questions, ask your nurse or doctor.        ALPRAZolam 0.25 MG tablet Commonly known as:  XANAX Take 1 tablet (0.25 mg total) by mouth 3 (three) times daily as needed for anxiety.   amLODipine 2.5 MG tablet Commonly known as:  NORVASC Take 1 tablet (2.5 mg total) by mouth daily.   anastrozole 1 MG tablet Commonly known as:  ARIMIDEX Take 1 tablet (1 mg total) by mouth daily.   atorvastatin 40 MG tablet Commonly known as:  LIPITOR TAKE 1 & 1 2 (ONE & ONE HALF) TABLETS BY MOUTH ONCE DAILY   cephALEXin 500 MG capsule Commonly known as:  KEFLEX Take 1 capsule (500 mg total) by  mouth 4 (four) times daily. Started by:  Worthy Rancher, MD   dofetilide 250 MCG capsule Commonly known as:  Tikosyn Take 1 capsule (250 mcg total) by mouth 2 (two) times daily.   folic acid 1 MG tablet Commonly known as:  FOLVITE Take 1 mg by mouth daily.   glucose blood test strip Commonly known as:  TRUEtest Test Check blood sugar daily   hydroxychloroquine 200 MG tablet Commonly known as:  PLAQUENIL Take 1 tablet (200 mg total) by mouth 2 (two) times daily.   lisinopril 20 MG tablet Commonly known as:  ZESTRIL Take 1 tablet (20 mg total) by mouth daily.   magnesium oxide 400 MG tablet Commonly known as:  MAG-OX Take 400 mg by mouth 2 (two)  times daily.   methotrexate (PF) 100 MG/4ML injection Inject into the muscle once a week.   potassium chloride 10 MEQ tablet Commonly known as:  Klor-Con M20 Take 1 tablet (10 mEq total) by mouth daily.   traMADol 50 MG tablet Commonly known as:  ULTRAM Take 1 tablet (50 mg total) by mouth every 6 (six) hours as needed for moderate pain or severe pain.   warfarin 5 MG tablet Commonly known as:  Coumadin Take as directed by the anticoagulation clinic. If you are unsure how to take this medication, talk to your nurse or doctor. Original instructions:  Take 1 tablet (5 mg total) by mouth daily.        Objective:   BP 129/69   Pulse (!) 59   Temp (!) 97.5 F (36.4 C) (Oral)   Ht 5\' 6"  (1.676 m)   Wt 184 lb 9.6 oz (83.7 kg)   BMI 29.80 kg/m   Wt Readings from Last 3 Encounters:  05/05/19 184 lb 9.6 oz (83.7 kg)  03/10/19 180 lb 9.6 oz (81.9 kg)  01/26/19 173 lb 3.2 oz (78.6 kg)    Physical Exam Vitals signs and nursing note reviewed.  Constitutional:      General: She is not in acute distress.    Appearance: She is well-developed. She is not diaphoretic.  Eyes:     Conjunctiva/sclera: Conjunctivae normal.  Abdominal:     General: Bowel sounds are normal. There is no distension.     Palpations: Abdomen is soft. Abdomen is not rigid. There is no mass.     Tenderness: There is no abdominal tenderness. There is no guarding or rebound.  Neurological:     Mental Status: She is alert.     Coordination: Coordination normal.    Description   INR good, continue dosing of warfarin to 7.5 mg Sunday, Monday, Wednesday, Thursday and Friday. Continue warfarin 5 mg all other days.   INR 2.1 today, normal range 2.0-3.0      Urinalysis: Nitrite positive, leukocyte 3+, blood 3+, otherwise negative  Assessment & Plan:   Problem List Items Addressed This Visit      Cardiovascular and Mediastinum   Persistent atrial fibrillation   Relevant Medications   atorvastatin  (LIPITOR) 40 MG tablet   Other Relevant Orders   CoaguChek XS/INR Waived     Other   High risk medication use   Relevant Orders   CoaguChek XS/INR Waived    Other Visit Diagnoses    Acute cystitis without hematuria    -  Primary   Relevant Medications   cephALEXin (KEFLEX) 500 MG capsule   Other Relevant Orders   Urinalysis   Urine Culture  Follow up plan: Return in about 4 weeks (around 06/02/2019), or if symptoms worsen or fail to improve, for INR recheck.  Counseling provided for all of the vaccine components Orders Placed This Encounter  Procedures  . Urine Culture  . Urinalysis  . CoaguChek XS/INR [place], MD [facility] 05/05/2019, 2:24 PM

## 2019-05-09 LAB — URINE CULTURE

## 2019-05-25 ENCOUNTER — Telehealth: Payer: Self-pay | Admitting: *Deleted

## 2019-05-25 NOTE — Telephone Encounter (Signed)
05/25/19 Unable to reach,call would not go through.Per recording.

## 2019-05-28 ENCOUNTER — Encounter: Payer: Self-pay | Admitting: Oncology

## 2019-05-28 DIAGNOSIS — Z853 Personal history of malignant neoplasm of breast: Secondary | ICD-10-CM | POA: Diagnosis not present

## 2019-05-28 DIAGNOSIS — N6310 Unspecified lump in the right breast, unspecified quadrant: Secondary | ICD-10-CM | POA: Diagnosis not present

## 2019-05-28 DIAGNOSIS — M810 Age-related osteoporosis without current pathological fracture: Secondary | ICD-10-CM | POA: Diagnosis not present

## 2019-06-07 ENCOUNTER — Other Ambulatory Visit: Payer: Self-pay | Admitting: Cardiology

## 2019-06-09 DIAGNOSIS — I2510 Atherosclerotic heart disease of native coronary artery without angina pectoris: Secondary | ICD-10-CM | POA: Diagnosis not present

## 2019-06-09 DIAGNOSIS — C50911 Malignant neoplasm of unspecified site of right female breast: Secondary | ICD-10-CM | POA: Diagnosis not present

## 2019-06-09 DIAGNOSIS — E663 Overweight: Secondary | ICD-10-CM | POA: Diagnosis not present

## 2019-06-09 DIAGNOSIS — M79676 Pain in unspecified toe(s): Secondary | ICD-10-CM | POA: Diagnosis not present

## 2019-06-09 DIAGNOSIS — L84 Corns and callosities: Secondary | ICD-10-CM | POA: Diagnosis not present

## 2019-06-09 DIAGNOSIS — I4891 Unspecified atrial fibrillation: Secondary | ICD-10-CM | POA: Diagnosis not present

## 2019-06-09 DIAGNOSIS — K5780 Diverticulitis of intestine, part unspecified, with perforation and abscess without bleeding: Secondary | ICD-10-CM | POA: Diagnosis not present

## 2019-06-09 DIAGNOSIS — I70203 Unspecified atherosclerosis of native arteries of extremities, bilateral legs: Secondary | ICD-10-CM | POA: Diagnosis not present

## 2019-06-09 DIAGNOSIS — M810 Age-related osteoporosis without current pathological fracture: Secondary | ICD-10-CM | POA: Diagnosis not present

## 2019-06-09 DIAGNOSIS — M0579 Rheumatoid arthritis with rheumatoid factor of multiple sites without organ or systems involvement: Secondary | ICD-10-CM | POA: Diagnosis not present

## 2019-06-09 DIAGNOSIS — Z6828 Body mass index (BMI) 28.0-28.9, adult: Secondary | ICD-10-CM | POA: Diagnosis not present

## 2019-06-09 DIAGNOSIS — B351 Tinea unguium: Secondary | ICD-10-CM | POA: Diagnosis not present

## 2019-06-09 DIAGNOSIS — R300 Dysuria: Secondary | ICD-10-CM | POA: Diagnosis not present

## 2019-06-09 DIAGNOSIS — R5383 Other fatigue: Secondary | ICD-10-CM | POA: Diagnosis not present

## 2019-06-11 ENCOUNTER — Other Ambulatory Visit: Payer: Self-pay | Admitting: *Deleted

## 2019-06-11 DIAGNOSIS — C50411 Malignant neoplasm of upper-outer quadrant of right female breast: Secondary | ICD-10-CM

## 2019-06-15 ENCOUNTER — Inpatient Hospital Stay (HOSPITAL_BASED_OUTPATIENT_CLINIC_OR_DEPARTMENT_OTHER): Payer: Medicare Other | Admitting: Oncology

## 2019-06-15 ENCOUNTER — Inpatient Hospital Stay: Payer: Medicare Other | Attending: Oncology

## 2019-06-15 ENCOUNTER — Other Ambulatory Visit: Payer: Self-pay

## 2019-06-15 VITALS — BP 166/53 | HR 109 | Temp 98.5°F | Resp 17 | Ht 66.0 in | Wt 187.3 lb

## 2019-06-15 DIAGNOSIS — Z79811 Long term (current) use of aromatase inhibitors: Secondary | ICD-10-CM | POA: Insufficient documentation

## 2019-06-15 DIAGNOSIS — Z170 Estrogen receptor positive status [ER+]: Secondary | ICD-10-CM | POA: Diagnosis not present

## 2019-06-15 DIAGNOSIS — C50411 Malignant neoplasm of upper-outer quadrant of right female breast: Secondary | ICD-10-CM | POA: Diagnosis not present

## 2019-06-15 LAB — CBC WITH DIFFERENTIAL (CANCER CENTER ONLY)
Abs Immature Granulocytes: 0.01 10*3/uL (ref 0.00–0.07)
Basophils Absolute: 0 10*3/uL (ref 0.0–0.1)
Basophils Relative: 1 %
Eosinophils Absolute: 0.2 10*3/uL (ref 0.0–0.5)
Eosinophils Relative: 3 %
HCT: 39.7 % (ref 36.0–46.0)
Hemoglobin: 12.6 g/dL (ref 12.0–15.0)
Immature Granulocytes: 0 %
Lymphocytes Relative: 26 %
Lymphs Abs: 1.6 10*3/uL (ref 0.7–4.0)
MCH: 28.1 pg (ref 26.0–34.0)
MCHC: 31.7 g/dL (ref 30.0–36.0)
MCV: 88.4 fL (ref 80.0–100.0)
Monocytes Absolute: 0.6 10*3/uL (ref 0.1–1.0)
Monocytes Relative: 10 %
Neutro Abs: 3.7 10*3/uL (ref 1.7–7.7)
Neutrophils Relative %: 60 %
Platelet Count: 174 10*3/uL (ref 150–400)
RBC: 4.49 MIL/uL (ref 3.87–5.11)
RDW: 15 % (ref 11.5–15.5)
WBC Count: 6.1 10*3/uL (ref 4.0–10.5)
nRBC: 0 % (ref 0.0–0.2)

## 2019-06-15 LAB — CMP (CANCER CENTER ONLY)
ALT: 14 U/L (ref 0–44)
AST: 16 U/L (ref 15–41)
Albumin: 3.3 g/dL — ABNORMAL LOW (ref 3.5–5.0)
Alkaline Phosphatase: 131 U/L — ABNORMAL HIGH (ref 38–126)
Anion gap: 8 (ref 5–15)
BUN: 15 mg/dL (ref 8–23)
CO2: 24 mmol/L (ref 22–32)
Calcium: 10.3 mg/dL (ref 8.9–10.3)
Chloride: 107 mmol/L (ref 98–111)
Creatinine: 0.79 mg/dL (ref 0.44–1.00)
GFR, Est AFR Am: >60 mL/min (ref >60)
GFR, Estimated: >60 mL/min (ref >60)
Glucose, Bld: 111 mg/dL — ABNORMAL HIGH (ref 70–99)
Potassium: 4.9 mmol/L (ref 3.5–5.1)
Sodium: 139 mmol/L (ref 135–145)
Total Bilirubin: 0.3 mg/dL (ref 0.3–1.2)
Total Protein: 7.8 g/dL (ref 6.5–8.1)

## 2019-06-15 NOTE — Progress Notes (Signed)
[place]  Telephone:([phone]) [phone] Fax:(336) [phone]     ID: [name] OB: [birth year]  MR#: [id number]  JYN#:[id number]  PCP: Dettinger, [name], MD  Patient Care Team: Dettinger, [name], MD as PCP - General (Family Medicine) Minus Breeding, MD as PCP - Cardiology (Cardiology) Danie Binder, MD (Gastroenterology) Minus Breeding, MD as Consulting Physician (Cardiology) Magrinat, Virgie Dad, MD as Consulting Physician (Oncology) [name], MD as Attending Physician (Radiation Oncology) Rosita Kea, PA-C as Consulting Physician (Family Medicine) Excell Seltzer, MD as Consulting Physician (General Surgery)   CHIEF COMPLAINT: Estrogen receptor positive breast cancer   CURRENT TREATMENT: Completing 5 years of anastrozole   BREAST CANCER HISTORY: From the original intake note:   "Melody Casey" had routine breast mammography at [facility] 01/14/2014. The breast density was category B. A new cluster of heterogeneous calcifications were noted in the right breast and on 01/28/2014 the patient underwent stereotactic biopsy of this area with the pathology ([id number]) showing an invasive ductal carcinoma, grade 1, estrogen receptor 100% positive, with strong staining intensity; progesterone receptor 63% positive, with moderate staining intensity, with an MIB-1 of 26% and no HER-2 amplification, the ratio being 1.44 and the number per cell 2.80.  On 02/01/2014 the patient underwent bilateral MRI of the breast, with no enhancement associated with the previously biopsied area. However there was a separate 7 mm enhancing mass in the lower inner quadrant and biopsy of this mass 02/03/2014 showed benign breast parenchyma, with no atypia. Review of this pathology showed atypical ductal hyperplasia, and therefore excisional biopsy was recommended, performed 02/10/2014 and showing only atypical ductal hyperplasia, with adenosis and calcifications ([id number]).  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 Tumor.   Her subsequent history is as detailed below   INTERVAL HISTORY: Melody Casey returns today for follow-up and treatment of her estrogen receptor positive breast cancer.  She continues on anastrozole. She will have some occasional hot flashes at night. She does not have any issues with hot flashes.  Nancy's last bone density screening on 05/28/2019, showed a T-score of -4.2, which is considered osteoporotic.    Since her last visit here, she underwent digital mammography with tomography at [facility] 05/28/2019 showing the breast density to be category B.  There was a 5 mm reniform mass in the right breast posterior depth upper region, an ultrasound was recommended.  This was performed the same day and showed no sonographic evidence of malignancy.  That 5 mm mass turned out to be a lymph node which was benign.   REVIEW OF SYSTEMS: Melody Casey notes that she has had diverticulitis on and off, with complications in 29/5284. She underwent a colostomy on 08/06/2018, and has had it since then. She is taking appropriate precautions against the spread of COVID-19. She is currently not working and has been doing basic chores around the house. The patient denies unusual headaches, visual changes, nausea, vomiting, or dizziness. There has been no unusual cough, phlegm production, or pleurisy. This been no change in bowel or bladder habits. The patient denies unexplained fatigue or unexplained weight loss, bleeding, rash, or fever. A detailed review of systems was otherwise noncontributory.    PAST MEDICAL HISTORY: Past Medical History:  Diagnosis Date  . AAA (abdominal aortic aneurysm) ([place])   . Acute diverticulitis   . Anxiety   .  Arthritis   . Atrial fibrillation ([place])    a. Dx 03/2011 - on tikosyn/coumadin.  Marland Kitchen CAD  (coronary artery disease)    a. NSTEMI 02/2011: occ mid Cx, DES to OM2, residual nonobst LAD dz.  . Cancer ([place])    breast  . Diabetes mellitus   . Diverticulitis   . Diverticulitis of colon     2008, 04/2011, 12/2014, 08/2015  . 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 Hemangioma    liver  . HLD (hyperlipidemia)   . HTN (hypertension)   . Osteoporosis   . Tobacco abuse    stopped smoking 2012  . Ureteral obstruction    History of gross hematuria/right hydronephrosis 2/2 to uteropelvic junction obstruction, s/p cystoscopy in January 2007 with bilateral retrograde pyelography, right ureter arthroscopy, right ureteral stent placement, bladder biopsies, stent removal since then.  . Wears dentures    top    PAST SURGICAL HISTORY: Past Surgical History:  Procedure Laterality Date  . BIOPSY N/A 11/01/2015   Procedure: BIOPSY;  Surgeon: Danie Binder, MD;  Location: [facility];  Service: Endoscopy;  Laterality: N/A;  . BREAST LUMPECTOMY WITH NEEDLE LOCALIZATION AND AXILLARY SENTINEL LYMPH NODE BX Right 03/01/2014   Procedure: BREAST LUMPECTOMY WITH NEEDLE LOCALIZATION AND AXILLARY SENTINEL LYMPH NODE BX;  Surgeon: Edward Jolly, MD;  Location: [place];  Service: General;  Laterality: Right;  . BREAST SURGERY    . CARDIAC CATHETERIZATION  2012   stent  . COLON RESECTION N/A 07/25/2018   Procedure: COLOSTOMY;  Surgeon: Rolm Bookbinder, MD;  Location: [place];  Service: General;  Laterality: N/A;  . COLONOSCOPY WITH PROPOFOL N/A 06/29/2014   Dr. Wynetta Emery: universal diverticulosis  . CORONARY STENT PLACEMENT  2012  . ESOPHAGOGASTRODUODENOSCOPY (EGD) WITH PROPOFOL N/A 11/01/2015   Procedure: ESOPHAGOGASTRODUODENOSCOPY (EGD) WITH PROPOFOL;  Surgeon: Danie Binder, MD;  Location: [facility];  Service: Endoscopy;  Laterality: N/A;  . KNEE ARTHROSCOPY     left  . LAPAROTOMY N/A 07/25/2018   Procedure: EXPLORATORY LAPAROTOMY;  Surgeon: Rolm Bookbinder, MD;  Location: [place];  Service: General;  Laterality: N/A;  . PARTIAL COLECTOMY N/A 07/25/2018   Procedure: COLECTOMY;  Surgeon: Rolm Bookbinder, MD;  Location: [place];  Service: General;  Laterality: N/A;  . Right leg surgery  age 87   As a child for  ?scleroderma per patient  . TONSILLECTOMY AND ADENOIDECTOMY    . TUBAL LIGATION    . Ureteral surgery  2011   rt ureterostomy-stent    FAMILY HISTORY Family History  Problem Relation Age of Onset  . Lung cancer Father 33       deceased  . Cancer Father   . Heart failure Mother 25       deceased  . Heart disease Mother   . Diabetes Brother   . Hypertension Brother   . Cancer Brother        prostate  . Colon cancer Neg Hx   . Liver disease Neg Hx   The patient's father died at the age of 57 with a history of lung cancer. He was a smoker and also had asbestos exposure. The patient's mother died at the age of 70. The patient had one brother. There is no other history of cancer in the family to her knowledge  GYNECOLOGIC HISTORY:  Menarche age 41, first live birth age 2. The patient stopped having periods approximately 1997. She did not take hormone replacement.   SOCIAL HISTORY:  Melody Casey works part time for [employer]. Her husband of 20+ years, [name] "[name]" Simone is a retired Futures trader. He has a history of head and neck cancer treated at [facility] years ago. Daughter Jaylan Duggar lives in [name] and is a Scientist, clinical (histocompatibility and immunogenetics). Son [name] lives in [name] and he is an Conservator, museum/gallery. The patient has 7 grandchildren and 2 great-grandchildren. She attends [employer]     ADVANCED DIRECTIVES:  in place    HEALTH MAINTENANCE: Social History   Tobacco Use  . Smoking status: Former Smoker    Packs/day: 2.00    Years: 50.00    Pack years: 100.00    Types: Cigarettes    Quit date: 02/08/2011    Years since quitting: 8.3  . Smokeless tobacco: Never Used  Substance  Use Topics  . Alcohol use: No    Alcohol/week: 0.0 standard drinks  . Drug use: No     Colonoscopy: never   PAP: 2013  Bone density: on 04/30/2017 showed a T score of -3.7 osteoporosis  Lipid panel:  Allergies  Allergen Reactions  . Miralax [Polyethylene Glycol] Swelling and Rash    Took [facility] brand developed rash. Patient states she tolerated name brand MiraLax in the past.  09/01/15. Patient states she had diffuse swelling including swelling of her lips.   . Vancomycin Rash and Shortness Of Breath  . Acetaminophen Hives  . Oxycodone-Acetaminophen Itching  . Sulfa Antibiotics Rash    All over rash  . Sulfacetamide Sodium Rash    All over rash  . Sulfasalazine Rash    All over rash All over rash  . Banana Other (See Comments)    Unknown  . Latex Rash  . Tape Rash    Current Outpatient Medications  Medication Sig Dispense Refill  . ALPRAZolam (XANAX) 0.25 MG tablet Take 1 tablet (0.25 mg total) by mouth 3 (three) times daily as needed for anxiety. 30 tablet 0  . amLODipine (NORVASC) 2.5 MG tablet Take 1 tablet (2.5 mg total) by mouth daily. 90 tablet 3  . atorvastatin (LIPITOR) 40 MG tablet TAKE 1 & 1 2 (ONE & ONE HALF) TABLETS BY MOUTH ONCE DAILY    . dofetilide (TIKOSYN) 250 MCG capsule Take 1 capsule (250 mcg total) by mouth 2 (two) times daily. 180 capsule 3  . glucose blood (TRUETEST TEST) test strip Check blood sugar daily 100 each 3  . hydroxychloroquine (PLAQUENIL) 200 MG tablet Take 1 tablet (200 mg total) by mouth 2 (two) times daily. 60 tablet 0  . lisinopril (PRINIVIL,ZESTRIL) 20 MG tablet Take 1 tablet (20 mg total) by mouth daily. 90 tablet 1  . magnesium oxide (MAG-OX) 400 MG tablet Take 400 mg by mouth 2 (two) times daily.    . potassium chloride (K-DUR) 10 MEQ tablet Take 1 tablet by mouth once daily 30 tablet 1  . traMADol (ULTRAM) 50 MG tablet Take 1 tablet (50 mg total) by mouth every 6 (six) hours as needed for moderate pain or severe pain. 30 tablet 0  .  warfarin (COUMADIN) 5 MG tablet Take 1 tablet (5 mg total) by mouth daily. 30 tablet 11   No current facility-administered medications for this visit.     OBJECTIVE: Older white woman who appears stated age  12:   06/15/19 0958  BP: (!) 166/53  Pulse: (!) 109  Resp: 17  Temp: 98.5 F (36.9 C)  SpO2: 98%  Body mass index is 30.23 kg/m.    ECOG FS:1 - Symptomatic but completely ambulatory   Sclerae unicteric, pupils round and equal Wearing a mask No cervical or supraclavicular adenopathy Lungs no rales or rhonchi Heart regular rate and rhythm Abd soft, nontender, positive bowel sounds, colostomy in place MSK no focal spinal tenderness, no upper extremity lymphedema Neuro: nonfocal, well oriented, appropriate affect Breasts: The right breast has undergone lumpectomy.  There is no evidence of local recurrence.  The left breast is unremarkable.  Both axillae are benign.  LAB RESULTS:  CMP     Component Value Date/Time   NA 139 06/15/2019 0937   NA 136 09/08/2018 1123   NA 134 (L) 06/06/2017 1259   K 4.9 06/15/2019 0937   K 4.5 06/06/2017 1259   CL 107 06/15/2019 0937   CO2 24 06/15/2019 0937   CO2 24 06/06/2017 1259   GLUCOSE 111 (H) 06/15/2019 0937   GLUCOSE 311 (H) 06/06/2017 1259   BUN 15 06/15/2019 0937   BUN 10 09/08/2018 1123   BUN 18.3 06/06/2017 1259   CREATININE 0.79 06/15/2019 0937   CREATININE 1.0 06/06/2017 1259   CALCIUM 10.3 06/15/2019 0937   CALCIUM 10.6 (H) 06/06/2017 1259   PROT 7.8 06/15/2019 0937   PROT 6.5 07/22/2018 1632   PROT 7.7 06/06/2017 1259   ALBUMIN 3.3 (L) 06/15/2019 0937   ALBUMIN 3.5 07/22/2018 1632   ALBUMIN 3.3 (L) 06/06/2017 1259   AST 16 06/15/2019 0937   AST 17 06/06/2017 1259   ALT 14 06/15/2019 0937   ALT 14 06/06/2017 1259   ALKPHOS 131 (H) 06/15/2019 0937   ALKPHOS 115 06/06/2017 1259   BILITOT 0.3 06/15/2019 0937   BILITOT 0.52 06/06/2017 1259   GFRNONAA >60 06/15/2019 0937   GFRNONAA 80 06/22/2015 1614    GFRAA >60 06/15/2019 0937   GFRAA >89 06/22/2015 1614    I No results found for: SPEP  Lab Results  Component Value Date   WBC 6.1 06/15/2019   NEUTROABS 3.7 06/15/2019   HGB 12.6 06/15/2019   HCT 39.7 06/15/2019   MCV 88.4 06/15/2019   PLT 174 06/15/2019      Chemistry      Component Value Date/Time   NA 139 06/15/2019 0937   NA 136 09/08/2018 1123   NA 134 (L) 06/06/2017 1259   K 4.9 06/15/2019 0937   K 4.5 06/06/2017 1259   CL 107 06/15/2019 0937   CO2 24 06/15/2019 0937   CO2 24 06/06/2017 1259   BUN 15 06/15/2019 0937   BUN 10 09/08/2018 1123   BUN 18.3 06/06/2017 1259   CREATININE 0.79 06/15/2019 0937   CREATININE 1.0 06/06/2017 1259      Component Value Date/Time   CALCIUM 10.3 06/15/2019 0937   CALCIUM 10.6 (H) 06/06/2017 1259   ALKPHOS 131 (H) 06/15/2019 0937   ALKPHOS 115 06/06/2017 1259   AST 16 06/15/2019 0937   AST 17 06/06/2017 1259   ALT 14 06/15/2019 0937   ALT 14 06/06/2017 1259   BILITOT 0.3 06/15/2019 0937   BILITOT 0.52 06/06/2017 1259       No results found for: LABCA2  No components found for: LABCA125  No results for input(s): INR in the last 168 hours.  Urinalysis    Component Value Date/Time   COLORURINE YELLOW 09/01/2015 1800   APPEARANCEUR Cloudy (A) 05/05/2019 1412   LABSPEC 1.025 09/01/2015 1800   PHURINE 6.0 09/01/2015 1800   GLUCOSEU Negative 05/05/2019 1412  GLUCOSEU NEGATIVE 04/05/2008 0833   HGBUR SMALL (A) 09/01/2015 1800   BILIRUBINUR Negative 05/05/2019 1412   KETONESUR NEGATIVE 09/01/2015 1800   PROTEINUR Negative 05/05/2019 1412   PROTEINUR TRACE (A) 09/01/2015 1800   UROBILINOGEN negative 10/25/2015 0926   UROBILINOGEN 0.2 09/01/2015 1800   NITRITE Positive (A) 05/05/2019 1412   NITRITE NEGATIVE 09/01/2015 1800   LEUKOCYTESUR 3+ (A) 05/05/2019 1412    STUDIES: Bone density mammography and ultrasonography over 1618 2020 discussed with the patient  ASSESSMENT: 73 y.o. [place] woman status post  right breast upper outer quadrant biopsy  01/28/2014 for a clinical T1b N0, stage IA invasive ductal carcinoma, grade 1, estrogen and progesterone receptor positive, HER-2 not amplified, with an MIB-1 of 26%  (1) Biopsy of 2 additional suspicious areas in the right breast 02/03/2014 and 02/10/2014 showed only atypical ductal hyperplasia  (2) status post right lumpectomy and sentinel lymph node sampling 03/01/2014 showed an additional 3 mm area of invasive ductal carcinoma, grade 1, with negative margins. The single sentinel lymph node was negative.  (3) Opted against adjuvant radiation given the marginal benefit of local recurrence  (4) started anastrozole 03/29/2014, interrupted May 2016 with joint swelling; restarted 06/20/15  (a) osteoporosis with T score -3.5 on bone density scan 04/11/2015  (b) bone density on 04/30/2017 showed a T score of -3.7 osteoporosis  (c) bone density 05/28/2019 shows a T score of -4.2  PLAN: Melody Casey little over 5 years out from definitive surgery for her breast cancer with no evidence of disease recurrence.  This is very favorable.  She is completing 5 years of anastrozole.  I would not anticipate that she would get any significant benefit from continuing this drug.  Accordingly she is going off at this point.  She has significant osteoporosis.  She tells me she was under treatment for this at one point but somehow that "dropped off".  She will be discussing this further with her primary care physician and I gave her a copy of all her studies for her records  At this point I am comfortable releasing her to her primary care physician.  All she will need in terms of breast cancer follow-up is a yearly mammogram with tomography and a yearly physician breast exam.  She is very interested in participating in our survivorship program and I have made her a return appointment with our survivorship nurse practitioner for 1 year  We will be glad to see and at any point in  the future if and when the need arises but as of now we are making no further routine appointments for her with me here.   Magrinat, Virgie Dad, MD  06/15/19 10:45 AM Medical Oncology and Hematology [facility] [address] [place], [place] [address] Tel. [phone]    Fax. [phone]  I, Jacqualyn Posey am acting as a Education administrator for Chauncey Cruel, MD.   I, Lurline Del MD, have reviewed the above documentation for accuracy and completeness, and I agree with the above.

## 2019-06-16 ENCOUNTER — Telehealth: Payer: Self-pay | Admitting: Adult Health

## 2019-06-16 DIAGNOSIS — H25013 Cortical age-related cataract, bilateral: Secondary | ICD-10-CM | POA: Diagnosis not present

## 2019-06-16 DIAGNOSIS — H18413 Arcus senilis, bilateral: Secondary | ICD-10-CM | POA: Diagnosis not present

## 2019-06-16 DIAGNOSIS — H2511 Age-related nuclear cataract, right eye: Secondary | ICD-10-CM | POA: Diagnosis not present

## 2019-06-16 DIAGNOSIS — H2513 Age-related nuclear cataract, bilateral: Secondary | ICD-10-CM | POA: Diagnosis not present

## 2019-06-16 DIAGNOSIS — H25043 Posterior subcapsular polar age-related cataract, bilateral: Secondary | ICD-10-CM | POA: Diagnosis not present

## 2019-06-16 NOTE — Telephone Encounter (Signed)
I left a message regarding schedule  

## 2019-06-22 ENCOUNTER — Other Ambulatory Visit: Payer: Self-pay

## 2019-06-24 ENCOUNTER — Other Ambulatory Visit: Payer: Self-pay

## 2019-06-24 ENCOUNTER — Ambulatory Visit (INDEPENDENT_AMBULATORY_CARE_PROVIDER_SITE_OTHER): Payer: Medicare Other | Admitting: Family Medicine

## 2019-06-24 ENCOUNTER — Encounter: Payer: Self-pay | Admitting: Family Medicine

## 2019-06-24 VITALS — BP 152/77 | HR 61 | Temp 97.7°F | Ht 66.0 in | Wt 186.2 lb

## 2019-06-24 DIAGNOSIS — I4819 Other persistent atrial fibrillation: Secondary | ICD-10-CM | POA: Diagnosis not present

## 2019-06-24 DIAGNOSIS — Z7901 Long term (current) use of anticoagulants: Secondary | ICD-10-CM

## 2019-06-24 DIAGNOSIS — Z79899 Other long term (current) drug therapy: Secondary | ICD-10-CM

## 2019-06-24 LAB — COAGUCHEK XS/INR WAIVED
INR: 2.2 — ABNORMAL HIGH (ref 0.9–1.1)
Prothrombin Time: 26.4 s

## 2019-06-24 NOTE — Progress Notes (Signed)
BP (!) 152/77   Pulse 61   Temp 97.7 F (36.5 C) (Oral)   Ht 5\' 6"  (1.676 m)   Wt 186 lb 3.2 oz (84.5 kg)   BMI 30.05 kg/m    Subjective:   Patient ID: Melody Casey, female    DOB: [birth year], 73 y.o.   MRN: [id number]  HPI: Melody Casey is a 73 y.o. female presenting on 06/24/2019 for Coagulation Disorder and Anxiety (pateint would like a refill of xanax)   HPI Patient is coming in for anticoagulation recheck today.  She is currently on warfarin.  She denies any major bruising or bleeding.  She currently has A. fib.  Patient denies any chest pain or palpitations.  Anxiety Patient is coming in to discuss anxiety.  She has previously used Xanax or problem just when she needs it which is very infrequently.  She last had 30 pills refilled in September 2019 and usually 30 pills will last for more than a year.  Patient denies any suicidal ideations or thoughts of hurting herself.  She says the anxiety is not very frequent but only very rarely does not build up to the point where she feels panicky and like she might have a panic attack. PDMP reviewed Current prescription of alprazolam 0.25 mg daily as needed, 30  Relevant past medical, surgical, family and social history reviewed and updated as indicated. Interim medical history since our last visit reviewed. Allergies and medications reviewed and updated.  Review of Systems  Constitutional: Negative for chills and fever.  Eyes: Negative for visual disturbance.  Respiratory: Negative for chest tightness and shortness of breath.   Cardiovascular: Negative for chest pain and leg swelling.  Musculoskeletal: Negative for back pain and gait problem.  Skin: Negative for rash.  Neurological: Negative for light-headedness and headaches.  Psychiatric/Behavioral: Negative for agitation, behavioral problems, dysphoric mood, self-injury, sleep disturbance and suicidal ideas. The patient is nervous/anxious.   All other systems reviewed and are  negative.   Per HPI unless specifically indicated above   Objective:   BP (!) 152/77   Pulse 61   Temp 97.7 F (36.5 C) (Oral)   Ht 5\' 6"  (1.676 m)   Wt 186 lb 3.2 oz (84.5 kg)   BMI 30.05 kg/m   Wt Readings from Last 3 Encounters:  06/24/19 186 lb 3.2 oz (84.5 kg)  06/15/19 187 lb 4.8 oz (85 kg)  05/05/19 184 lb 9.6 oz (83.7 kg)    Physical Exam Vitals signs and nursing note reviewed.  Constitutional:      General: She is not in acute distress.    Appearance: She is well-developed. She is not diaphoretic.  Eyes:     Conjunctiva/sclera: Conjunctivae normal.  Cardiovascular:     Rate and Rhythm: Normal rate. Rhythm irregular.     Heart sounds: Normal heart sounds. No murmur.  Pulmonary:     Effort: Pulmonary effort is normal. No respiratory distress.     Breath sounds: Normal breath sounds. No wheezing.  Skin:    General: Skin is warm and dry.     Findings: No rash.  Neurological:     Mental Status: She is alert and oriented to person, place, and time.     Coordination: Coordination normal.  Psychiatric:        Behavior: Behavior normal.     Description   INR good, continue dosing of warfarin to 7.5 mg Sunday, Monday, Wednesday, Thursday and Friday. Continue warfarin 5 mg all other days.  INR 2.2 today, normal range 2.0-3.0      Assessment & Plan:   Problem List Items Addressed This Visit      Cardiovascular and Mediastinum   Persistent atrial fibrillation     Other   High risk medication use   Chronic anticoagulation - Primary   Relevant Orders   CoaguChek XS/INR Waived (Completed)       Follow up plan: Return in about 4 weeks (around 07/22/2019), or if symptoms worsen or fail to improve, for INR recheck.  Counseling provided for all of the vaccine components Orders Placed This Encounter  Procedures  . CoaguChek XS/INR [place], MD [facility] 06/24/2019, 10:23 AM

## 2019-06-29 DIAGNOSIS — H2511 Age-related nuclear cataract, right eye: Secondary | ICD-10-CM | POA: Diagnosis not present

## 2019-06-30 ENCOUNTER — Telehealth: Payer: Self-pay | Admitting: Family Medicine

## 2019-06-30 ENCOUNTER — Ambulatory Visit: Payer: Medicare Other | Admitting: Cardiology

## 2019-06-30 DIAGNOSIS — H2512 Age-related nuclear cataract, left eye: Secondary | ICD-10-CM | POA: Diagnosis not present

## 2019-06-30 MED ORDER — ALPRAZOLAM 0.25 MG PO TABS: 0.2500 mg | ORAL_TABLET | Freq: Three times a day (TID) | ORAL | 2 refills | Status: DC | PRN

## 2019-06-30 NOTE — Telephone Encounter (Signed)
Patient aware.

## 2019-06-30 NOTE — Telephone Encounter (Signed)
Please advise 

## 2019-06-30 NOTE — Telephone Encounter (Signed)
Please tell her sorry about that, when I pulled up the note apparently I pended it but did not send it so I have sent it now.

## 2019-07-06 DIAGNOSIS — Z939 Artificial opening status, unspecified: Secondary | ICD-10-CM | POA: Diagnosis not present

## 2019-07-06 NOTE — Progress Notes (Signed)
Cardiology Office Note   Date:  07/07/2019   ID:  Melody Casey, DOB 1946/11/15, MRN [id number]  PCP:  Dettinger, [name], MD  Cardiologist:  Dr. Percival Spanish  No chief complaint on file.    History of Present Illness: Melody Casey is a 73 y.o. female who presents for ongoing assessment and management of coronary artery disease, and history of paroxysmal atrial fibrillation, AAA (last evaluation in August 2018) hypertension, hyperlipidemia and anxiety.  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  When last seen by Dr. Percival Spanish in person on 12/15/2018 the patient was stable from a cardiac standpoint.  She remained in normal sinus rhythm on Tikosyn.  She continues on Coumadin therapy with a CHADS VASC Score of 5.  She had no testing or medication changes at that time.   The patient comes today for preoperative evaluation with planned cataract surgery by Dr.Bevins of [facility] [place].  Surgery is planned for July 13, 2019.  After she has her cataracts done approximately 1 month later she is to have a re-anastomosis and revision of colostomy by Dr. Marcello Moores at [facility] surgery.  Date is to be determined.  The patient denies any complaints of chest pain, dyspnea on exertion, fatigue, palpitations, dizziness or near syncope.  She denies any bleeding issues or excessive bruising on Coumadin.  PT/INR is followed by Dr. Warrick Parisian of [place].   Past Medical History:  Diagnosis Date  . AAA (abdominal aortic aneurysm) ([place])   . Acute diverticulitis   . Anxiety   . Arthritis   . Atrial fibrillation ([place])    a. Dx 03/2011 - on tikosyn/coumadin.  Marland Kitchen CAD (coronary artery disease)    a. NSTEMI 02/2011: occ mid Cx, DES to OM2, residual nonobst LAD dz.  . Cancer ([place])    breast  . Diabetes mellitus   . Diverticulitis   . Diverticulitis of colon     2008, 04/2011,  12/2014, 08/2015  . 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 Hemangioma    liver  . HLD (hyperlipidemia)   . HTN (hypertension)   . Osteoporosis   . Tobacco abuse    stopped smoking 2012  . Ureteral obstruction    History of gross hematuria/right hydronephrosis 2/2 to uteropelvic junction obstruction, s/p cystoscopy in January 2007 with bilateral retrograde pyelography, right ureter arthroscopy, right ureteral stent placement, bladder biopsies, stent removal since then.  . Wears dentures    top    Past Surgical History:  Procedure Laterality Date  . BIOPSY N/A 11/01/2015   Procedure: BIOPSY;  Surgeon: Danie Binder, MD;  Location: [facility];  Service: Endoscopy;  Laterality: N/A;  . BREAST LUMPECTOMY WITH NEEDLE LOCALIZATION AND AXILLARY SENTINEL LYMPH NODE BX Right 03/01/2014   Procedure: BREAST LUMPECTOMY WITH NEEDLE LOCALIZATION AND AXILLARY SENTINEL LYMPH NODE BX;  Surgeon: Edward Jolly, MD;  Location: [place];  Service: General;  Laterality: Right;  . BREAST SURGERY    . CARDIAC CATHETERIZATION  2012   stent  . COLON RESECTION N/A 07/25/2018   Procedure: COLOSTOMY;  Surgeon: Rolm Bookbinder, MD;  Location: [place];  Service: General;  Laterality: N/A;  . COLONOSCOPY WITH PROPOFOL N/A 06/29/2014   Dr. Wynetta Emery: universal diverticulosis  . CORONARY STENT PLACEMENT  2012  . ESOPHAGOGASTRODUODENOSCOPY (EGD) WITH PROPOFOL N/A 11/01/2015   Procedure: ESOPHAGOGASTRODUODENOSCOPY (EGD) WITH PROPOFOL;  Surgeon: Danie Binder, MD;  Location: [facility];  Service: Endoscopy;  Laterality: N/A;  . KNEE ARTHROSCOPY     left  . LAPAROTOMY N/A 07/25/2018   Procedure: EXPLORATORY LAPAROTOMY;  Surgeon: Rolm Bookbinder, MD;  Location: [place];  Service: General;  Laterality: N/A;  . PARTIAL COLECTOMY N/A 07/25/2018   Procedure: COLECTOMY;  Surgeon: Rolm Bookbinder, MD;  Location: [place];  Service: General;  Laterality: N/A;  . Right leg surgery  age 20   As a child for   ?scleroderma per patient  . TONSILLECTOMY AND ADENOIDECTOMY    . TUBAL LIGATION    . Ureteral surgery  2011   rt ureterostomy-stent     Current Outpatient Medications  Medication Sig Dispense Refill  . ALPRAZolam (XANAX) 0.25 MG tablet Take 1 tablet (0.25 mg total) by mouth 3 (three) times daily as needed for anxiety. 30 tablet 2  . amLODipine (NORVASC) 2.5 MG tablet Take 1 tablet (2.5 mg total) by mouth daily. 90 tablet 3  . atorvastatin (LIPITOR) 40 MG tablet TAKE 1 & 1 2 (ONE & ONE HALF) TABLETS BY MOUTH ONCE DAILY    . dofetilide (TIKOSYN) 250 MCG capsule Take 1 capsule (250 mcg total) by mouth 2 (two) times daily. 180 capsule 3  . glucose blood (TRUETEST TEST) test strip Check blood sugar daily 100 each 3  . hydroxychloroquine (PLAQUENIL) 200 MG tablet Take 1 tablet (200 mg total) by mouth 2 (two) times daily. 60 tablet 0  . lisinopril (PRINIVIL,ZESTRIL) 20 MG tablet Take 1 tablet (20 mg total) by mouth daily. 90 tablet 1  . magnesium oxide (MAG-OX) 400 MG tablet Take 400 mg by mouth 2 (two) times daily.    . potassium chloride (K-DUR) 10 MEQ tablet Take 1 tablet by mouth once daily 30 tablet 1  . traMADol (ULTRAM) 50 MG tablet Take 1 tablet (50 mg total) by mouth every 6 (six) hours as needed for moderate pain or severe pain. 30 tablet 0  . warfarin (COUMADIN) 5 MG tablet Take 1 tablet (5 mg total) by mouth daily. 30 tablet 11   No current facility-administered medications for this visit.     Allergies:   Miralax [polyethylene glycol], Vancomycin, Acetaminophen, Oxycodone-acetaminophen, Sulfa antibiotics, Sulfacetamide sodium, Sulfasalazine, Banana, Latex, and Tape    Social History:  The patient  reports that she quit smoking about 8 years ago. Her smoking use included cigarettes. She has a 100.00 pack-year smoking history. She has never used smokeless tobacco. She reports that she does not drink alcohol or use drugs.   Family History:  The patient's family history includes  Cancer in her brother and father; Diabetes in her brother; Heart disease in her mother; Heart failure (age of onset: 38) in her mother; Hypertension in her brother; Lung cancer (age of onset: 29) in her father.    ROS: All other systems are reviewed and negative. Unless otherwise mentioned in H&P    PHYSICAL EXAM: VS:  BP (!) 157/66   Pulse 63   Temp (!) 97.3 F (36.3 C)   Ht 5\' 7"  (1.702 m)   Wt 187 lb (84.8 kg)   SpO2 95%   BMI 29.29 kg/m  , BMI Body mass index is 29.29 kg/m. GEN: Well nourished, well developed, in no acute distress HEENT: normal Neck: no JVD, carotid bruits, or masses Cardiac: RRR; no murmurs, rubs, or gallops,no edema  Respiratory:  Clear to auscultation bilaterally, normal work of breathing GI: soft, nontender, nondistended, + BS MS: no deformity or atrophy Skin: warm  and dry, no rash Neuro:  Strength and sensation are intact Psych: euthymic mood, full affect   EKG:  Sinus rhythm with frequent PVC's. Rate of 63 bpm.   Recent Labs: 09/08/2018: Magnesium 2.0 06/15/2019: ALT 14; BUN 15; Creatinine 0.79; Hemoglobin 12.6; Platelet Count 174; Potassium 4.9; Sodium 139    Lipid Panel    Component Value Date/Time   CHOL 145 07/10/2018 1348   TRIG 141 07/25/2018 1326   HDL 50 07/10/2018 1348   CHOLHDL 2.9 07/10/2018 1348   CHOLHDL 6.2 02/14/2011 0330   VLDL 26 02/14/2011 0330   LDLCALC 68 07/10/2018 1348   LDLDIRECT 158.6 04/05/2008 0833      Wt Readings from Last 3 Encounters:  07/07/19 187 lb (84.8 kg)  06/24/19 186 lb 3.2 oz (84.5 kg)  06/15/19 187 lb 4.8 oz (85 kg)      Other studies Reviewed: Echocardiogram Aug 27, 2015 Left ventricle: The cavity size was normal. There was mild   concentric hypertrophy. Systolic function was normal. The   estimated ejection fraction was in the range of 60% to 65%. Wall   motion was normal; there were no regional wall motion   abnormalities. Left ventricular diastolic function parameters   were normal. -  Pulmonary arteries: PA peak pressure: 32 mm Hg (S).  Venous Doppler Ultrasound 07/31/2018 Final Interpretation: Right: There is no evidence of a common femoral vein obstruction. Left: There is no evidence of deep vein thrombosis in the lower extremity. No cystic structure found in the popliteal fossa.   ASSESSMENT AND PLAN:  1.  Preoperative cardiac evaluation:   Chart reviewed as part of pre-operative protocol coverage. Given past medical history and time since last visit, based on ACC/AHA guidelines, Melody Casey would be at acceptable risk for the planned cataract removal without further cardiovascular testing.  There is no need to hold warfarin for this procedure.  2.  Atrial fibrillation: The patient is now on Tikosyn 250 mg twice daily.  EKG was completed today which revealed sinus rhythm with frequent PVCs QT interval 450 ms with a QTC of 460 ms.  The patient will need to follow-up with EP for ongoing management of Tikosyn.  Continue Coumadin therapy.  3.  Hypercholesterolemia: Patient continues on atorvastatin 40 mg daily.  Follow-up lipids and LFTs should be completed by primary care in the next couple of months.  She reports that her primary care physician is managing all of her labs.  4.  Hypertension: Blood pressure slightly elevated today in the office on initial evaluation.  Recheck of her blood pressure 142/64.  She will continue amlodipine as directed.  As date has not been determined concerning revision of colostomy, cannot comment on Coumadin cessation timing.  This is followed by Dr. Jimmy Footman.  From a cardiac standpoint she is cleared to proceed with that surgery as well.  If not done within the next 3 months she will need to have a follow-up appointment with cardiology for reevaluation.  Current medicines are reviewed at length with the patient today.    Labs/ tests ordered today include: None  Phill Myron. [place] Pugh, ANP, AACC   07/07/2019 4:53 PM    [place] [address] Office [phone] Fax [phone]

## 2019-07-07 ENCOUNTER — Telehealth: Payer: Self-pay | Admitting: Adult Health

## 2019-07-07 ENCOUNTER — Encounter: Payer: Self-pay | Admitting: Adult Health

## 2019-07-07 ENCOUNTER — Ambulatory Visit (INDEPENDENT_AMBULATORY_CARE_PROVIDER_SITE_OTHER): Payer: Medicare Other | Admitting: Adult Health

## 2019-07-07 ENCOUNTER — Other Ambulatory Visit: Payer: Self-pay | Admitting: General Surgery

## 2019-07-07 ENCOUNTER — Other Ambulatory Visit: Payer: Self-pay

## 2019-07-07 VITALS — BP 157/66 | HR 63 | Temp 97.3°F | Ht 67.0 in | Wt 187.0 lb

## 2019-07-07 DIAGNOSIS — I4891 Unspecified atrial fibrillation: Secondary | ICD-10-CM | POA: Diagnosis not present

## 2019-07-07 DIAGNOSIS — Z01810 Encounter for preprocedural cardiovascular examination: Secondary | ICD-10-CM

## 2019-07-07 DIAGNOSIS — I10 Essential (primary) hypertension: Secondary | ICD-10-CM | POA: Diagnosis not present

## 2019-07-07 DIAGNOSIS — E7800 Pure hypercholesterolemia, unspecified: Secondary | ICD-10-CM | POA: Diagnosis not present

## 2019-07-07 NOTE — Telephone Encounter (Signed)

## 2019-07-07 NOTE — Patient Instructions (Signed)

## 2019-07-09 ENCOUNTER — Other Ambulatory Visit: Payer: Self-pay | Admitting: General Surgery

## 2019-07-09 DIAGNOSIS — K5792 Diverticulitis of intestine, part unspecified, without perforation or abscess without bleeding: Secondary | ICD-10-CM

## 2019-07-13 ENCOUNTER — Telehealth: Payer: Self-pay | Admitting: Family Medicine

## 2019-07-13 DIAGNOSIS — H2512 Age-related nuclear cataract, left eye: Secondary | ICD-10-CM | POA: Diagnosis not present

## 2019-07-13 MED ORDER — WARFARIN SODIUM 5 MG PO TABS: 5.0000 mg | ORAL_TABLET | Freq: Every day | ORAL | 11 refills | Status: DC

## 2019-07-13 NOTE — Telephone Encounter (Signed)
Patient aware of coumadin dosage.  5mg - Tuesdays and Saturdays 7.5mg -Sundays, Mondays, Wednesdays, Thursdays and Fridays.

## 2019-07-16 ENCOUNTER — Encounter: Payer: Self-pay | Admitting: Gastroenterology

## 2019-07-21 ENCOUNTER — Ambulatory Visit
Admission: RE | Admit: 2019-07-21 | Discharge: 2019-07-21 | Disposition: A | Discharge status: Home / Self Care | Payer: Medicare Other | Source: Ambulatory Visit | Attending: General Surgery | Admitting: General Surgery

## 2019-07-21 ENCOUNTER — Other Ambulatory Visit: Payer: Self-pay

## 2019-07-21 DIAGNOSIS — K8020 Calculus of gallbladder without cholecystitis without obstruction: Secondary | ICD-10-CM | POA: Diagnosis not present

## 2019-07-21 DIAGNOSIS — K5730 Diverticulosis of large intestine without perforation or abscess without bleeding: Secondary | ICD-10-CM | POA: Diagnosis not present

## 2019-07-21 DIAGNOSIS — K5792 Diverticulitis of intestine, part unspecified, without perforation or abscess without bleeding: Secondary | ICD-10-CM

## 2019-07-21 DIAGNOSIS — N200 Calculus of kidney: Secondary | ICD-10-CM | POA: Diagnosis not present

## 2019-07-21 DIAGNOSIS — N281 Cyst of kidney, acquired: Secondary | ICD-10-CM | POA: Diagnosis not present

## 2019-07-21 MED ORDER — IOPAMIDOL (ISOVUE-300) INJECTION 61%
100.0000 mL | Freq: Once | INTRAVENOUS | Status: AC | PRN
  Administered 2019-07-21: 100 mL via INTRAVENOUS

## 2019-07-28 ENCOUNTER — Other Ambulatory Visit: Payer: Self-pay | Admitting: Family Medicine

## 2019-07-28 MED ORDER — WARFARIN SODIUM 5 MG PO TABS: 7.5000 mg | ORAL_TABLET | Freq: Every day | ORAL | 0 refills | Status: DC

## 2019-07-28 NOTE — Telephone Encounter (Signed)
Forwarding to refill pool

## 2019-07-28 NOTE — Telephone Encounter (Signed)
Pt aware refill sent to pharmacy 

## 2019-07-29 ENCOUNTER — Other Ambulatory Visit: Payer: Self-pay

## 2019-07-30 ENCOUNTER — Encounter: Payer: Self-pay | Admitting: Family Medicine

## 2019-07-30 ENCOUNTER — Ambulatory Visit (INDEPENDENT_AMBULATORY_CARE_PROVIDER_SITE_OTHER): Payer: Medicare Other | Admitting: Family Medicine

## 2019-07-30 VITALS — BP 100/58 | HR 65 | Temp 97.5°F | Ht 67.0 in | Wt 186.0 lb

## 2019-07-30 DIAGNOSIS — Z7901 Long term (current) use of anticoagulants: Secondary | ICD-10-CM

## 2019-07-30 DIAGNOSIS — Z79899 Other long term (current) drug therapy: Secondary | ICD-10-CM

## 2019-07-30 DIAGNOSIS — I4819 Other persistent atrial fibrillation: Secondary | ICD-10-CM | POA: Diagnosis not present

## 2019-07-30 LAB — COAGUCHEK XS/INR WAIVED
INR: 3.8 — ABNORMAL HIGH (ref 0.9–1.1)
Prothrombin Time: 45.2 s

## 2019-07-30 NOTE — Progress Notes (Signed)
BP (!) 100/58   Pulse 65   Temp (!) 97.5 F (36.4 C) (Temporal)   Ht 5\' 7"  (1.702 m)   Wt 186 lb (84.4 kg)   BMI 29.13 kg/m    Subjective:   Patient ID: Melody Casey, female    DOB: 02-Mar-1946, 73 y.o.   MRN: [id number]  HPI: Melody Casey is a 73 y.o. female presenting on 07/30/2019 for Coagulation Disorder   HPI Patient is coming in today for anticoagulation secondary to persistent A. fib.  She denies any major bruising or bleeding.  She says the last couple weeks of been very hectic and that is why it may have adjusted things.  Relevant past medical, surgical, family and social history reviewed and updated as indicated. Interim medical history since our last visit reviewed. Allergies and medications reviewed and updated.  Review of Systems  Constitutional: Negative for chills and fever.  Eyes: Negative for visual disturbance.  Respiratory: Negative for chest tightness and shortness of breath.   Cardiovascular: Negative for chest pain and leg swelling.  Musculoskeletal: Negative for back pain and gait problem.  Skin: Negative for rash.  Psychiatric/Behavioral: Negative for agitation and behavioral problems.  All other systems reviewed and are negative.   Per HPI unless specifically indicated above   Allergies as of 07/30/2019      Reactions   Miralax [polyethylene Glycol] Swelling, Rash   Took [facility] brand developed rash. Patient states she tolerated name brand MiraLax in the past. 09/01/15. Patient states she had diffuse swelling including swelling of her lips.    Vancomycin Rash, Shortness Of Breath   Acetaminophen Hives   Oxycodone-acetaminophen Itching   Sulfa Antibiotics Rash   All over rash   Sulfacetamide Sodium Rash   All over rash   Sulfasalazine Rash   All over rash All over rash   Banana Other (See Comments)   Unknown   Latex Rash   Tape Rash      Medication List       Accurate as of July 30, 2019  4:26 PM. If you have any questions, ask your  nurse or doctor.        ALPRAZolam 0.25 MG tablet Commonly known as: XANAX Take 1 tablet (0.25 mg total) by mouth 3 (three) times daily as needed for anxiety.   amLODipine 2.5 MG tablet Commonly known as: NORVASC Take 1 tablet (2.5 mg total) by mouth daily.   atorvastatin 40 MG tablet Commonly known as: LIPITOR TAKE 1 & 1 2 (ONE & ONE HALF) TABLETS BY MOUTH ONCE DAILY   dofetilide 250 MCG capsule Commonly known as: Tikosyn Take 1 capsule (250 mcg total) by mouth 2 (two) times daily.   glucose blood test strip Commonly known as: TRUEtest Test Check blood sugar daily   hydroxychloroquine 200 MG tablet Commonly known as: PLAQUENIL Take 1 tablet (200 mg total) by mouth 2 (two) times daily.   lisinopril 20 MG tablet Commonly known as: ZESTRIL Take 1 tablet (20 mg total) by mouth daily.   magnesium oxide 400 MG tablet Commonly known as: MAG-OX Take 400 mg by mouth 2 (two) times daily.   potassium chloride 10 MEQ tablet Commonly known as: K-DUR Take 1 tablet by mouth once daily   traMADol 50 MG tablet Commonly known as: ULTRAM Take 1 tablet (50 mg total) by mouth every 6 (six) hours as needed for moderate pain or severe pain.   warfarin 5 MG tablet Commonly known as: Coumadin Take as directed  by the anticoagulation clinic. If you are unsure how to take this medication, talk to your nurse or doctor. Original instructions: Take 1.5 tablets (7.5 mg total) by mouth daily.        Objective:   BP (!) 100/58   Pulse 65   Temp (!) 97.5 F (36.4 C) (Temporal)   Ht 5\' 7"  (1.702 m)   Wt 186 lb (84.4 kg)   BMI 29.13 kg/m   Wt Readings from Last 3 Encounters:  07/30/19 186 lb (84.4 kg)  07/07/19 187 lb (84.8 kg)  06/24/19 186 lb 3.2 oz (84.5 kg)    Physical Exam Vitals signs and nursing note reviewed.  Constitutional:      General: She is not in acute distress.    Appearance: She is well-developed. She is not diaphoretic.  Eyes:     Conjunctiva/sclera:  Conjunctivae normal.  Skin:    General: Skin is warm and dry.     Findings: No rash.  Neurological:     Mental Status: She is alert and oriented to person, place, and time.     Coordination: Coordination normal.  Psychiatric:        Behavior: Behavior normal.     Description   Hold today then decrease dosing of warfarin to 7.5 mg Sunday, Monday, Wednesday, and Friday. Continue warfarin 5 mg all other days.   INR 3.8 today, normal range 2.0-3.0      Assessment & Plan:   Problem List Items Addressed This Visit      Cardiovascular and Mediastinum   Persistent atrial fibrillation     Other   High risk medication use   Chronic anticoagulation - Primary   Relevant Orders   CoaguChek XS/INR Waived       Follow up plan: Return in about 4 weeks (around 08/27/2019), or if symptoms worsen or fail to improve, for INR recheck.  Counseling provided for all of the vaccine components Orders Placed This Encounter  Procedures  . CoaguChek XS/INR [place], MD [facility] 07/30/2019, 4:26 PM

## 2019-08-01 ENCOUNTER — Other Ambulatory Visit: Payer: Self-pay | Admitting: Cardiology

## 2019-08-05 NOTE — Telephone Encounter (Signed)
Please advise if OK to refill. Listed under historical provider. Per last office note, PCP managing labs. Thank you!

## 2019-08-07 ENCOUNTER — Other Ambulatory Visit: Payer: Self-pay | Admitting: Cardiology

## 2019-08-07 DIAGNOSIS — E7800 Pure hypercholesterolemia, unspecified: Secondary | ICD-10-CM

## 2019-08-07 DIAGNOSIS — Z5181 Encounter for therapeutic drug level monitoring: Secondary | ICD-10-CM

## 2019-08-07 DIAGNOSIS — I10 Essential (primary) hypertension: Secondary | ICD-10-CM

## 2019-08-07 NOTE — Telephone Encounter (Signed)
Refilled Atorvastatin and mailed orders for fasting labs soon

## 2019-08-09 ENCOUNTER — Other Ambulatory Visit: Payer: Self-pay | Admitting: Cardiology

## 2019-08-18 DIAGNOSIS — L84 Corns and callosities: Secondary | ICD-10-CM | POA: Diagnosis not present

## 2019-08-18 DIAGNOSIS — B351 Tinea unguium: Secondary | ICD-10-CM | POA: Diagnosis not present

## 2019-08-18 DIAGNOSIS — I70203 Unspecified atherosclerosis of native arteries of extremities, bilateral legs: Secondary | ICD-10-CM | POA: Diagnosis not present

## 2019-08-18 DIAGNOSIS — M79676 Pain in unspecified toe(s): Secondary | ICD-10-CM | POA: Diagnosis not present

## 2019-08-21 ENCOUNTER — Ambulatory Visit: Payer: Medicare Other | Admitting: Gastroenterology

## 2019-08-26 ENCOUNTER — Other Ambulatory Visit: Payer: Medicare Other

## 2019-08-26 ENCOUNTER — Other Ambulatory Visit: Payer: Self-pay

## 2019-08-26 DIAGNOSIS — I10 Essential (primary) hypertension: Secondary | ICD-10-CM | POA: Diagnosis not present

## 2019-08-26 DIAGNOSIS — E7800 Pure hypercholesterolemia, unspecified: Secondary | ICD-10-CM | POA: Diagnosis not present

## 2019-08-26 DIAGNOSIS — Z5181 Encounter for therapeutic drug level monitoring: Secondary | ICD-10-CM | POA: Diagnosis not present

## 2019-08-27 LAB — LIPID PANEL
Chol/HDL Ratio: 3 ratio (ref 0.0–4.4)
Cholesterol, Total: 136 mg/dL (ref 100–199)
HDL: 45 mg/dL (ref >39)
LDL Chol Calc (NIH): 71 mg/dL (ref 0–99)
Triglycerides: 106 mg/dL (ref 0–149)
VLDL Cholesterol Cal: 20 mg/dL (ref 5–40)

## 2019-08-27 LAB — COMPREHENSIVE METABOLIC PANEL
ALT: 12 IU/L (ref 0–32)
AST: 13 IU/L (ref 0–40)
Albumin/Globulin Ratio: 1.1 — ABNORMAL LOW (ref 1.2–2.2)
Albumin: 4 g/dL (ref 3.7–4.7)
Alkaline Phosphatase: 143 IU/L — ABNORMAL HIGH (ref 39–117)
BUN/Creatinine Ratio: 16 (ref 12–28)
BUN: 12 mg/dL (ref 8–27)
Bilirubin Total: 0.5 mg/dL (ref 0.0–1.2)
CO2: 22 mmol/L (ref 20–29)
Calcium: 10.5 mg/dL — ABNORMAL HIGH (ref 8.7–10.3)
Chloride: 106 mmol/L (ref 96–106)
Creatinine, Ser: 0.75 mg/dL (ref 0.57–1.00)
GFR calc Af Amer: 90 mL/min/{1.73_m2} (ref >59)
GFR calc non Af Amer: 78 mL/min/{1.73_m2} (ref >59)
Globulin, Total: 3.5 g/dL (ref 1.5–4.5)
Glucose: 108 mg/dL — ABNORMAL HIGH (ref 65–99)
Potassium: 4.3 mmol/L (ref 3.5–5.2)
Sodium: 141 mmol/L (ref 134–144)
Total Protein: 7.5 g/dL (ref 6.0–8.5)

## 2019-08-30 ENCOUNTER — Other Ambulatory Visit: Payer: Self-pay | Admitting: Cardiology

## 2019-08-30 ENCOUNTER — Other Ambulatory Visit: Payer: Self-pay | Admitting: Family Medicine

## 2019-09-03 DIAGNOSIS — M1712 Unilateral primary osteoarthritis, left knee: Secondary | ICD-10-CM | POA: Diagnosis not present

## 2019-09-08 DIAGNOSIS — R300 Dysuria: Secondary | ICD-10-CM | POA: Diagnosis not present

## 2019-09-08 DIAGNOSIS — M810 Age-related osteoporosis without current pathological fracture: Secondary | ICD-10-CM | POA: Diagnosis not present

## 2019-09-08 DIAGNOSIS — R5383 Other fatigue: Secondary | ICD-10-CM | POA: Diagnosis not present

## 2019-09-08 DIAGNOSIS — Z23 Encounter for immunization: Secondary | ICD-10-CM | POA: Diagnosis not present

## 2019-09-08 DIAGNOSIS — I4891 Unspecified atrial fibrillation: Secondary | ICD-10-CM | POA: Diagnosis not present

## 2019-09-08 DIAGNOSIS — M0579 Rheumatoid arthritis with rheumatoid factor of multiple sites without organ or systems involvement: Secondary | ICD-10-CM | POA: Diagnosis not present

## 2019-09-08 DIAGNOSIS — I2510 Atherosclerotic heart disease of native coronary artery without angina pectoris: Secondary | ICD-10-CM | POA: Diagnosis not present

## 2019-09-08 DIAGNOSIS — K5780 Diverticulitis of intestine, part unspecified, with perforation and abscess without bleeding: Secondary | ICD-10-CM | POA: Diagnosis not present

## 2019-09-08 DIAGNOSIS — C50911 Malignant neoplasm of unspecified site of right female breast: Secondary | ICD-10-CM | POA: Diagnosis not present

## 2019-09-08 DIAGNOSIS — Z6828 Body mass index (BMI) 28.0-28.9, adult: Secondary | ICD-10-CM | POA: Diagnosis not present

## 2019-09-08 DIAGNOSIS — E663 Overweight: Secondary | ICD-10-CM | POA: Diagnosis not present

## 2019-09-17 ENCOUNTER — Other Ambulatory Visit: Payer: Self-pay

## 2019-09-18 ENCOUNTER — Ambulatory Visit (INDEPENDENT_AMBULATORY_CARE_PROVIDER_SITE_OTHER): Payer: Medicare Other | Admitting: Family Medicine

## 2019-09-18 ENCOUNTER — Encounter: Payer: Self-pay | Admitting: Family Medicine

## 2019-09-18 VITALS — BP 136/81 | HR 60 | Temp 97.1°F | Ht 67.0 in | Wt 187.2 lb

## 2019-09-18 DIAGNOSIS — Z79899 Other long term (current) drug therapy: Secondary | ICD-10-CM | POA: Diagnosis not present

## 2019-09-18 DIAGNOSIS — I4819 Other persistent atrial fibrillation: Secondary | ICD-10-CM | POA: Diagnosis not present

## 2019-09-18 DIAGNOSIS — Z7901 Long term (current) use of anticoagulants: Secondary | ICD-10-CM

## 2019-09-18 LAB — COAGUCHEK XS/INR WAIVED
INR: 3.1 — ABNORMAL HIGH (ref 0.9–1.1)
Prothrombin Time: 37 s

## 2019-09-18 NOTE — Progress Notes (Signed)
BP 136/81   Pulse 60   Temp (!) 97.1 F (36.2 C) (Temporal)   Ht 5\' 7"  (1.702 m)   Wt 187 lb 3.2 oz (84.9 kg)   SpO2 95%   BMI 29.32 kg/m    Subjective:   Patient ID: Melody Casey, female    DOB: 17-Apr-1946, 73 y.o.   MRN: [id number]  HPI: Melody Casey is a 73 y.o. female presenting on 09/18/2019 for Coagulation Disorder   HPI Patient is coming in today for Coumadin and INR recheck.  She denies any bruising or bleeding.  She did reduce the dose is likely send last time and has been doing well on them.  She denies any major palpitations or flutters or issues with her heart.  Relevant past medical, surgical, family and social history reviewed and updated as indicated. Interim medical history since our last visit reviewed. Allergies and medications reviewed and updated.  Review of Systems  Constitutional: Negative for chills and fever.  Respiratory: Negative for chest tightness and shortness of breath.   Cardiovascular: Negative for chest pain, palpitations and leg swelling.  Gastrointestinal: Negative for blood in stool.  Genitourinary: Negative for hematuria.  Musculoskeletal: Negative for back pain and gait problem.  Skin: Negative for rash.  Neurological: Negative for dizziness, light-headedness and headaches.  Psychiatric/Behavioral: Negative for agitation and behavioral problems.  All other systems reviewed and are negative.   Per HPI unless specifically indicated above   Allergies as of 09/18/2019      Reactions   Miralax [polyethylene Glycol] Swelling, Rash   Took [facility] brand developed rash. Patient states she tolerated name brand MiraLax in the past. 09/01/15. Patient states she had diffuse swelling including swelling of her lips.    Vancomycin Rash, Shortness Of Breath   Acetaminophen Hives   Oxycodone-acetaminophen Itching   Sulfa Antibiotics Rash   All over rash   Sulfacetamide Sodium Rash   All over rash   Sulfasalazine Rash   All over rash All over rash    Banana Other (See Comments)   Unknown   Latex Rash   Tape Rash      Medication List       Accurate as of September 18, 2019  3:34 PM. If you have any questions, ask your nurse or doctor.        ALPRAZolam 0.25 MG tablet Commonly known as: XANAX Take 1 tablet (0.25 mg total) by mouth 3 (three) times daily as needed for anxiety.   amLODipine 2.5 MG tablet Commonly known as: NORVASC Take 1 tablet (2.5 mg total) by mouth daily.   atorvastatin 40 MG tablet Commonly known as: LIPITOR TAKE 1 & 1/2 (ONE & ONE-HALF) TABLETS BY MOUTH ONCE DAILY   dofetilide 250 MCG capsule Commonly known as: Tikosyn Take 1 capsule (250 mcg total) by mouth 2 (two) times daily.   glucose blood test strip Commonly known as: TRUEtest Test Check blood sugar daily   hydroxychloroquine 200 MG tablet Commonly known as: PLAQUENIL Take 1 tablet (200 mg total) by mouth 2 (two) times daily.   lisinopril 20 MG tablet Commonly known as: ZESTRIL Take 1 tablet by mouth once daily   magnesium oxide 400 MG tablet Commonly known as: MAG-OX Take 400 mg by mouth 2 (two) times daily.   potassium chloride 10 MEQ tablet Commonly known as: KLOR-CON Take 1 tablet by mouth once daily   traMADol 50 MG tablet Commonly known as: ULTRAM Take 1 tablet (50 mg total) by mouth every  6 (six) hours as needed for moderate pain or severe pain.   warfarin 5 MG tablet Commonly known as: Coumadin Take as directed by the anticoagulation clinic. If you are unsure how to take this medication, talk to your nurse or doctor. Original instructions: Take 1.5 tablets (7.5 mg total) by mouth daily.        Objective:   BP 136/81   Pulse 60   Temp (!) 97.1 F (36.2 C) (Temporal)   Ht 5\' 7"  (1.702 m)   Wt 187 lb 3.2 oz (84.9 kg)   SpO2 95%   BMI 29.32 kg/m   Wt Readings from Last 3 Encounters:  09/18/19 187 lb 3.2 oz (84.9 kg)  07/30/19 186 lb (84.4 kg)  07/07/19 187 lb (84.8 kg)    Physical Exam Vitals signs and  nursing note reviewed.  Constitutional:      General: She is not in acute distress.    Appearance: She is well-developed. She is not diaphoretic.  Eyes:     Conjunctiva/sclera: Conjunctivae normal.  Neurological:     Mental Status: She is alert and oriented to person, place, and time.     Coordination: Coordination normal.  Psychiatric:        Behavior: Behavior normal.       Assessment & Plan:   Problem List Items Addressed This Visit      Cardiovascular and Mediastinum   Persistent atrial fibrillation ([place])     Other   High risk medication use   Chronic anticoagulation - Primary   Relevant Orders   CoaguChek XS/INR Waived      Description   Hold today then decrease dosing of warfarin to 7.5 mg  Monday, Wednesday, and Friday. Continue warfarin 5 mg all other days.   INR 3.1 today, normal range 2.0-3.0 Recheck in 4 to 6 weeks     Follow up plan: Return in about 6 weeks (around 10/30/2019), or if symptoms worsen or fail to improve, for A. fib diabetes and INR.  Counseling provided for all of the vaccine components Orders Placed This Encounter  Procedures  . CoaguChek XS/INR [place], MD [facility] 09/18/2019, 3:34 PM

## 2019-09-29 IMAGING — CR DG CHEST 1V PORT
1 series · 1 of 1 positions shown · non-contrast
Comparison: 07/24/2018 and 08/31/2015.

CLINICAL DATA: Endotracheal tube and orogastric tube placement.

EXAM:
PORTABLE CHEST 1 VIEW

[AP]
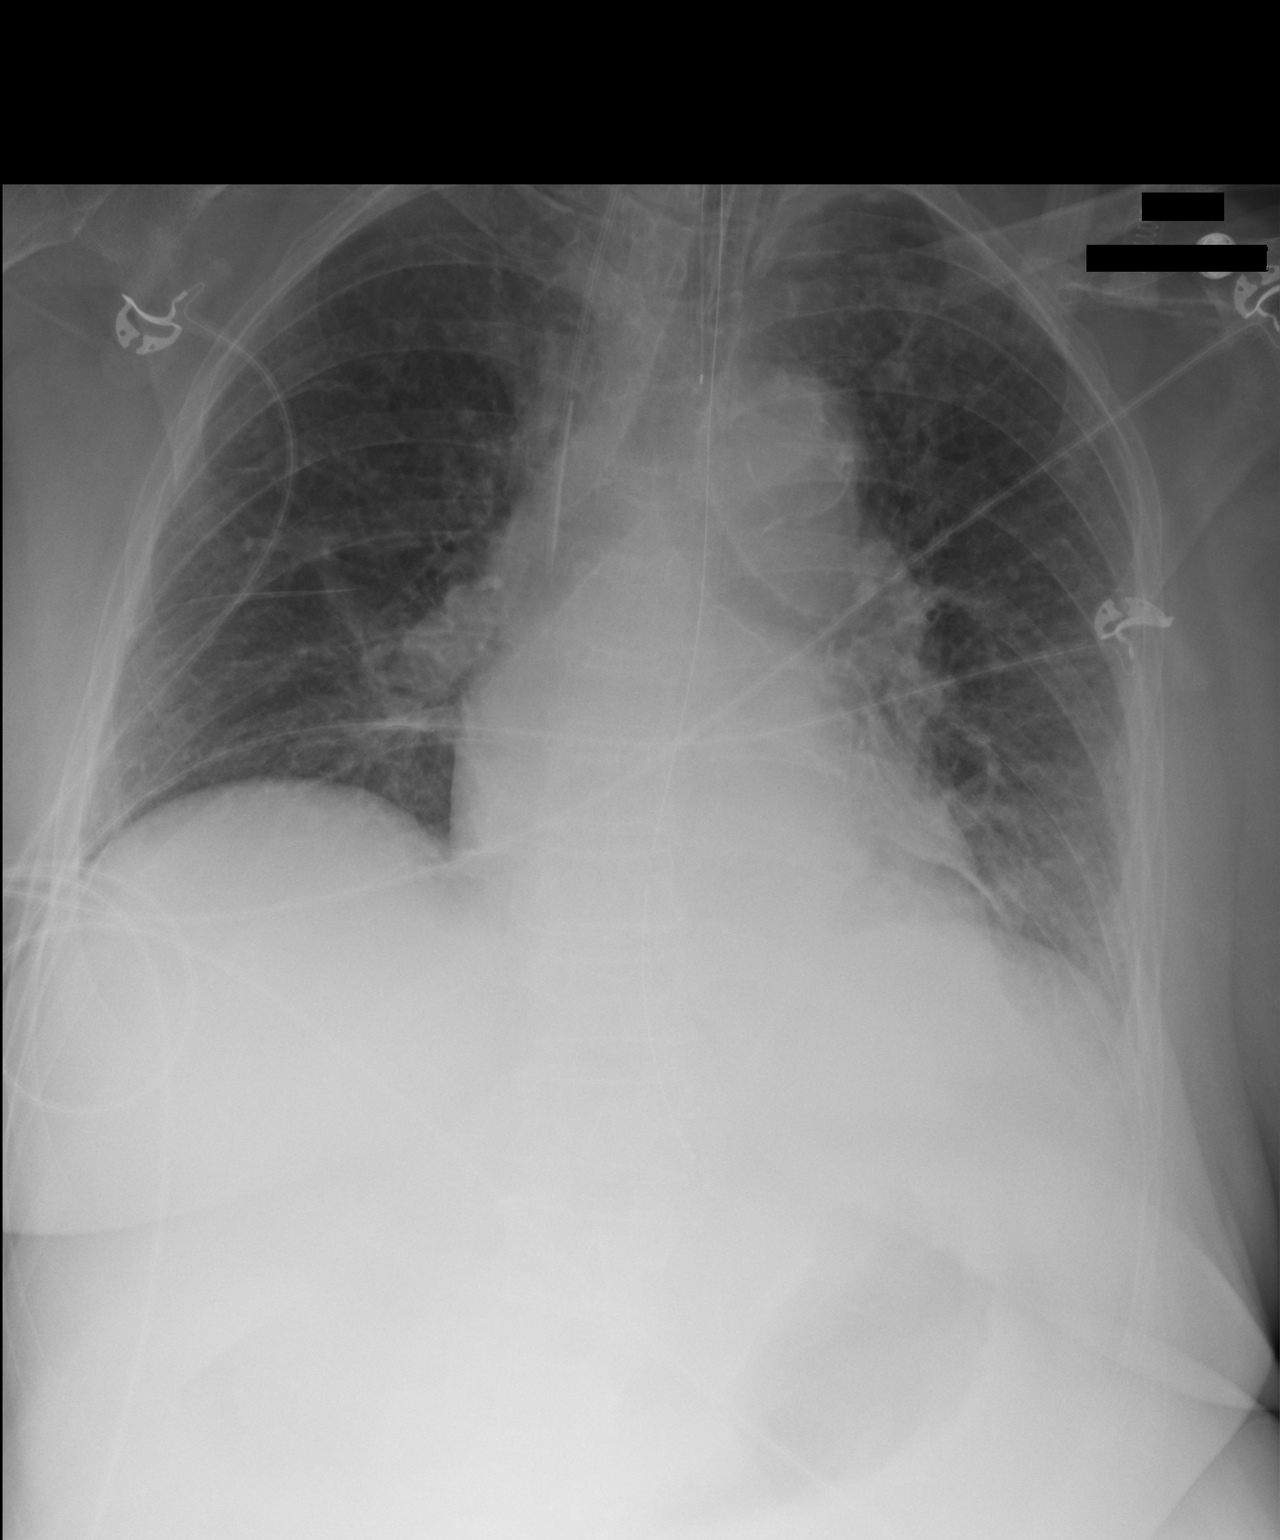

[1 of 1 positions shown; findings below may reference images not displayed]

FINDINGS: 2470 hours. Endotracheal tube tip is in the mid trachea. Right IJ
central venous catheter extends to the level of the mid SVC. Enteric
tube extends to the level of the proximal stomach, side hole at the
level of the distal esophagus. The heart size and mediastinal
contours are stable with aortic atherosclerosis. There are lower
lung volumes with increased patchy left basilar airspace disease. No
edema, pneumothorax or significant pleural effusion.
IMPRESSION: Support system positioned as above. The enteric tube should be
advanced further into the stomach. Increased left lower lobe
atelectasis or infiltrate.

## 2019-09-29 IMAGING — DX DG ABD PORTABLE 1V
1 series · 1 of 1 positions shown · non-contrast
Comparison: 07/25/2018

CLINICAL DATA: OG tube placement

EXAM:
PORTABLE ABDOMEN - 1 VIEW

[abdomen kub]
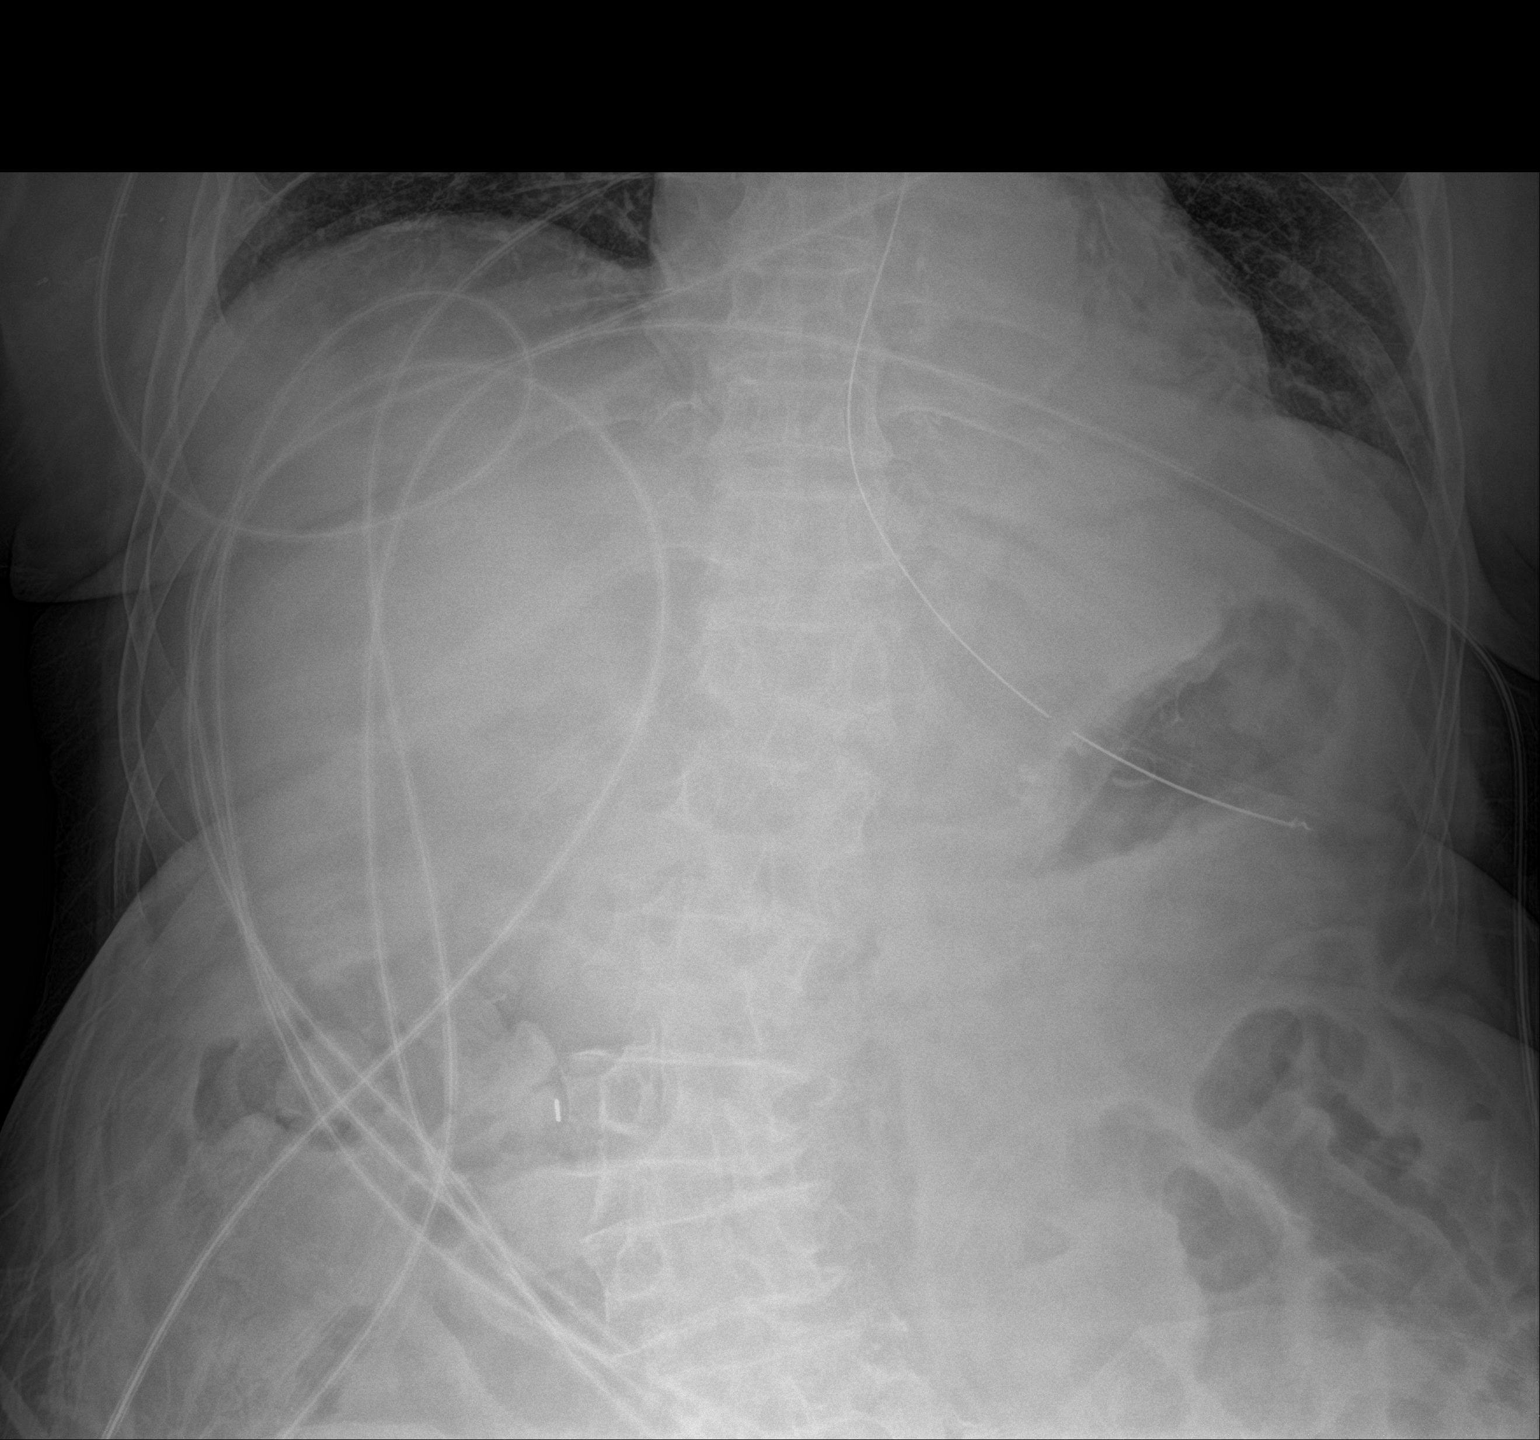

[1 of 1 positions shown; findings below may reference images not displayed]

FINDINGS: Esophageal tube tip and side port overlie the proximal stomach.
Airspace disease at the left base.
IMPRESSION: Esophageal tube tip and side-port project over the proximal stomach.

## 2019-09-30 ENCOUNTER — Telehealth: Payer: Self-pay | Admitting: *Deleted

## 2019-09-30 ENCOUNTER — Encounter: Payer: Self-pay | Admitting: Family Medicine

## 2019-09-30 ENCOUNTER — Other Ambulatory Visit: Payer: Self-pay

## 2019-09-30 ENCOUNTER — Ambulatory Visit (INDEPENDENT_AMBULATORY_CARE_PROVIDER_SITE_OTHER): Payer: Medicare Other | Admitting: Family Medicine

## 2019-09-30 DIAGNOSIS — Z7901 Long term (current) use of anticoagulants: Secondary | ICD-10-CM | POA: Diagnosis not present

## 2019-09-30 DIAGNOSIS — R3989 Other symptoms and signs involving the genitourinary system: Secondary | ICD-10-CM

## 2019-09-30 IMAGING — DX DG CHEST 1V PORT
1 series · 1 of 1 positions shown · non-contrast
Comparison: 07/25/2018

CLINICAL DATA: Respiratory failure

EXAM:
PORTABLE CHEST 1 VIEW

[chest]
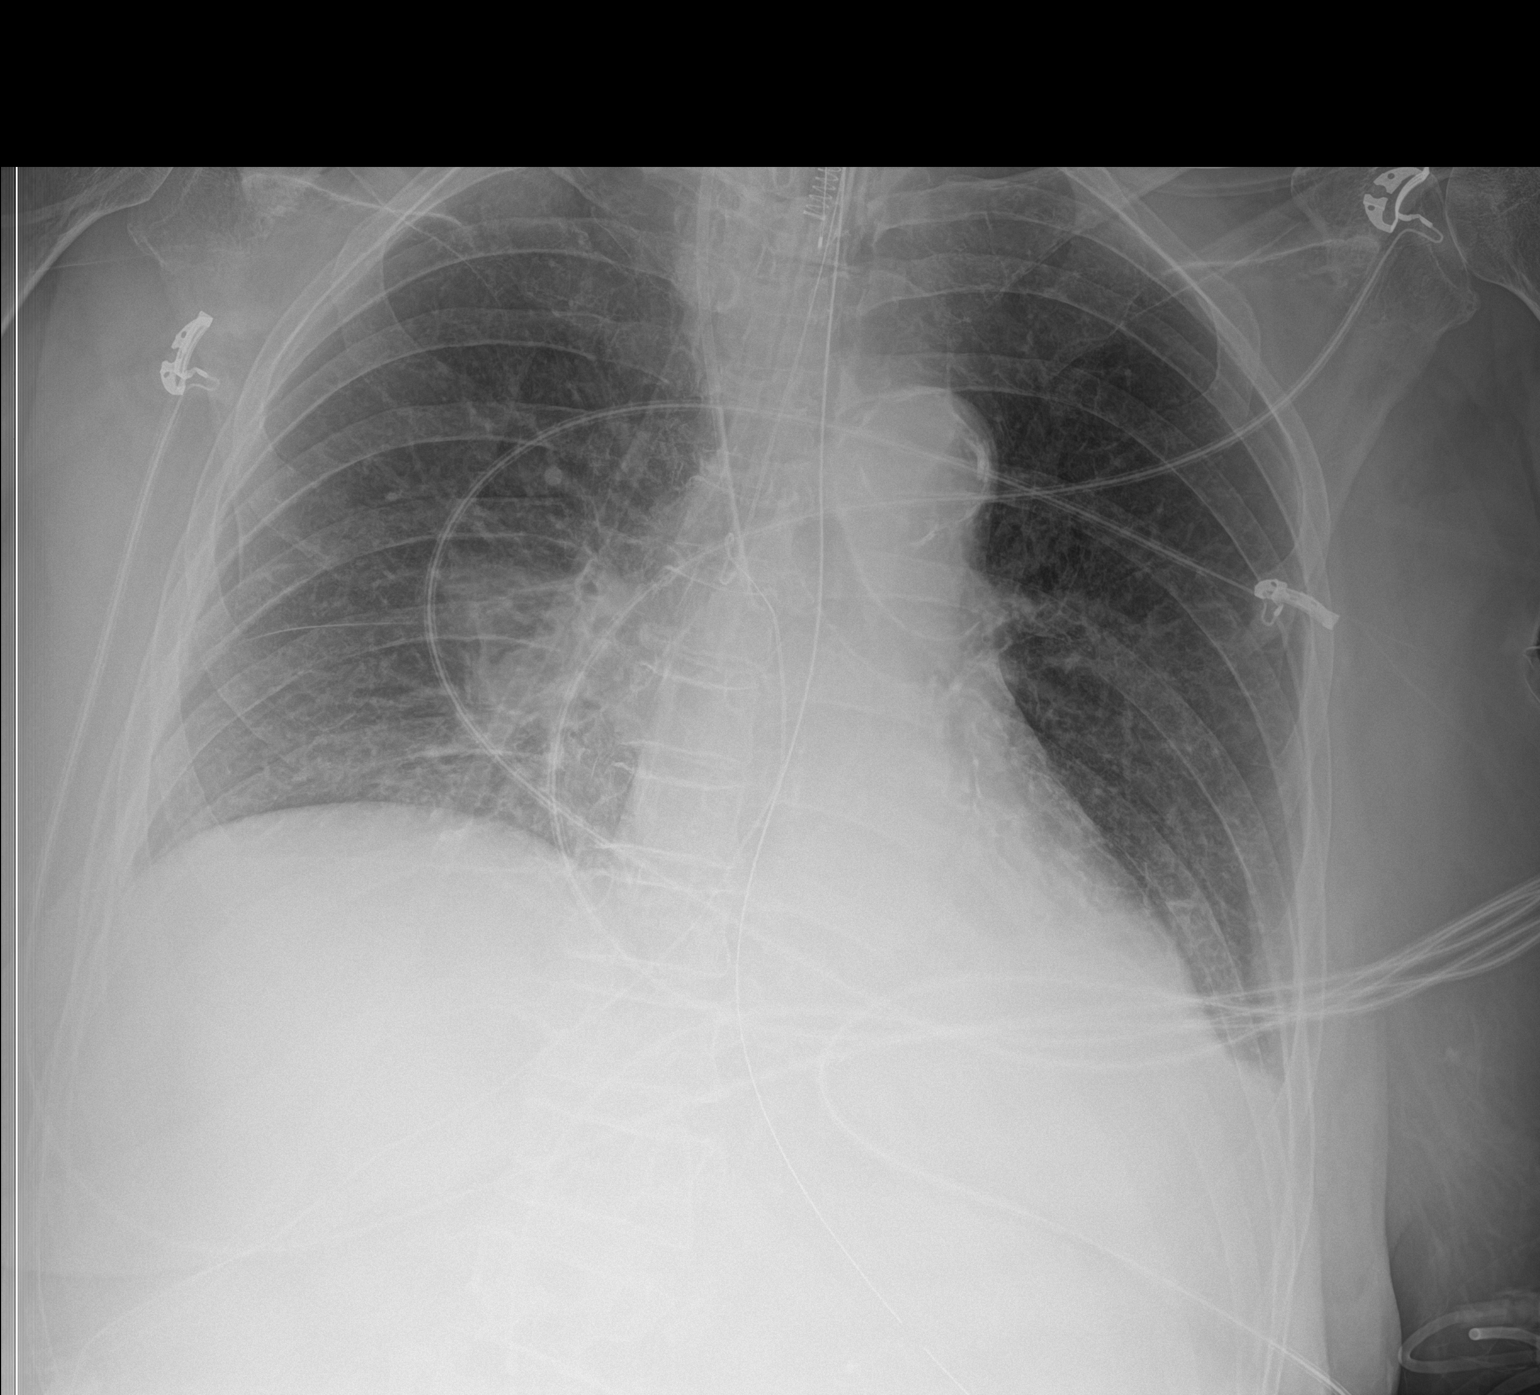

[1 of 1 positions shown; findings below may reference images not displayed]

FINDINGS: Two catheters overlying the UPPER midline are noted, 1 with tip
overlying the RIGHT mainstem bronchus and the other 6 cm above the
carina. It is difficult to determine which of these represents the
endotracheal tube..

A RIGHT IJ central venous catheter with tip overlying the LOWER SVC
and NG tube entering the stomach again noted.

Mild bibasilar atelectasis identified, LEFT-greater-than-RIGHT.

No pneumothorax.

Cardiomediastinal silhouette is stable.
IMPRESSION: Two catheters overlying the UPPER midline, difficult to determine
which one represents the endotracheal tube. One lies with tip
overlying the RIGHT mainstem bronchus and the other tip lies 6 cm
above the carina. Consider repeat imaging with external tubing moved
to the side.

Otherwise unchanged appearance the chest with mild bibasilar
atelectasis, LEFT-greater-than-RIGHT.

## 2019-09-30 MED ORDER — CIPROFLOXACIN HCL 500 MG PO TABS: 500.0000 mg | ORAL_TABLET | Freq: Two times a day (BID) | ORAL | 0 refills | Status: AC

## 2019-09-30 NOTE — Telephone Encounter (Signed)
[facility] [address] aware can fill script and will relay message to patient, need INR on Monday.

## 2019-09-30 NOTE — Telephone Encounter (Signed)
Left message to please call our office.  You will need to recheck INR on Monday due to antibiotic.

## 2019-09-30 NOTE — Telephone Encounter (Signed)
I want to fill current order. I asked that she come in for INR on Monday.

## 2019-09-30 NOTE — Telephone Encounter (Signed)
Cipro was sent in for patient but it can interact with the coumadin and cause an increase in levels.  Please advise if you will send in different script or let pharmacy know if you want to fill current order?

## 2019-09-30 NOTE — Patient Instructions (Signed)
Urinary Tract Infection, Adult A urinary tract infection (UTI) is an infection of any part of the urinary tract. The urinary tract includes:  The kidneys.  The ureters.  The bladder.  The urethra. These organs make, store, and get rid of pee (urine) in the body. What are the causes? This is caused by germs (bacteria) in your genital area. These germs grow and cause swelling (inflammation) of your urinary tract. What increases the risk? You are more likely to develop this condition if:  You have a small, thin tube (catheter) to drain pee.  You cannot control when you pee or poop (incontinence).  You are female, and: ? You use these methods to prevent pregnancy: ? A medicine that kills sperm (spermicide). ? A device that blocks sperm (diaphragm). ? You have low levels of a female hormone (estrogen). ? You are pregnant.  You have genes that add to your risk.  You are sexually active.  You take antibiotic medicines.  You have trouble peeing because of: ? A prostate that is bigger than normal, if you are female. ? A blockage in the part of your body that drains pee from the bladder (urethra). ? A kidney stone. ? A nerve condition that affects your bladder (neurogenic bladder). ? Not getting enough to drink. ? Not peeing often enough.  You have other conditions, such as: ? Diabetes. ? A weak disease-fighting system (immune system). ? Sickle cell disease. ? Gout. ? Injury of the spine. What are the signs or symptoms? Symptoms of this condition include:  Needing to pee right away (urgently).  Peeing often.  Peeing small amounts often.  Pain or burning when peeing.  Blood in the pee.  Pee that smells bad or not like normal.  Trouble peeing.  Pee that is cloudy.  Fluid coming from the vagina, if you are female.  Pain in the belly or lower back. Other symptoms include:  Throwing up (vomiting).  No urge to eat.  Feeling mixed up (confused).  Being tired  and grouchy (irritable).  A fever.  Watery poop (diarrhea). How is this treated? This condition may be treated with:  Antibiotic medicine.  Other medicines.  Drinking enough water. Follow these instructions at home:  Medicines  Take over-the-counter and prescription medicines only as told by your doctor.  If you were prescribed an antibiotic medicine, take it as told by your doctor. Do not stop taking it even if you start to feel better. General instructions  Make sure you: ? Pee until your bladder is empty. ? Do not hold pee for a long time. ? Empty your bladder after sex. ? Wipe from front to back after pooping if you are a female. Use each tissue one time when you wipe.  Drink enough fluid to keep your pee pale yellow.  Keep all follow-up visits as told by your doctor. This is important. Contact a doctor if:  You do not get better after 1-2 days.  Your symptoms go away and then come back. Get help right away if:  You have very bad back pain.  You have very bad pain in your lower belly.  You have a fever.  You are sick to your stomach (nauseous).  You are throwing up. Summary  A urinary tract infection (UTI) is an infection of any part of the urinary tract.  This condition is caused by germs in your genital area.  There are many risk factors for a UTI. These include having a small, thin   tube to drain pee and not being able to control when you pee or poop.  Treatment includes antibiotic medicines for germs.  Drink enough fluid to keep your pee pale yellow. This information is not intended to replace advice given to you by your health care provider. Make sure you discuss any questions you have with your health care provider. Document Released: 05/14/2008 Document Revised: 11/13/2018 Document Reviewed: 06/05/2018 Elsevier Patient Education  2020 [employer]  

## 2019-09-30 NOTE — Progress Notes (Signed)
Virtual Visit via Telephone Note  I connected with Melody Casey on 09/30/19 at 12:54 PM by telephone and verified that I am speaking with the correct person using two identifiers. Melody Casey is currently located at home and nobody is currently with her during this visit. The provider, Loman Brooklyn, FNP is located in their home at time of visit.  I discussed the limitations, risks, security and privacy concerns of performing an evaluation and management service by telephone and the availability of in person appointments. I also discussed with the patient that there may be a patient responsible charge related to this service. The patient expressed understanding and agreed to proceed.  Subjective: PCP: Dettinger, [name], MD  Chief Complaint  Patient presents with  . Urinary Tract Infection   Urinary Tract Infection: Patient complains of frequency and urgency She has had symptoms for 1 week. Patient denies any other symptoms. Patient does have a history of recurrent UTI.  Patient does not have a history of pyelonephritis. Patient has been taking Keflex BID since this past weekend and her symptoms are not improving.    ROS: Per HPI  Current Outpatient Medications:  .  ALPRAZolam (XANAX) 0.25 MG tablet, Take 1 tablet (0.25 mg total) by mouth 3 (three) times daily as needed for anxiety., Disp: 30 tablet, Rfl: 2 .  amLODipine (NORVASC) 2.5 MG tablet, Take 1 tablet (2.5 mg total) by mouth daily., Disp: 90 tablet, Rfl: 3 .  atorvastatin (LIPITOR) 40 MG tablet, TAKE 1 & 1/2 (ONE & ONE-HALF) TABLETS BY MOUTH ONCE DAILY, Disp: 135 tablet, Rfl: 0 .  dofetilide (TIKOSYN) 250 MCG capsule, Take 1 capsule (250 mcg total) by mouth 2 (two) times daily., Disp: 180 capsule, Rfl: 3 .  glucose blood (TRUETEST TEST) test strip, Check blood sugar daily, Disp: 100 each, Rfl: 3 .  hydroxychloroquine (PLAQUENIL) 200 MG tablet, Take 1 tablet (200 mg total) by mouth 2 (two) times daily., Disp: 60 tablet, Rfl:  0 .  lisinopril (ZESTRIL) 20 MG tablet, Take 1 tablet by mouth once daily, Disp: 90 tablet, Rfl: 0 .  magnesium oxide (MAG-OX) 400 MG tablet, Take 400 mg by mouth 2 (two) times daily., Disp: , Rfl:  .  potassium chloride (K-DUR) 10 MEQ tablet, Take 1 tablet by mouth once daily, Disp: 30 tablet, Rfl: 10 .  traMADol (ULTRAM) 50 MG tablet, Take 1 tablet (50 mg total) by mouth every 6 (six) hours as needed for moderate pain or severe pain., Disp: 30 tablet, Rfl: 0 .  warfarin (COUMADIN) 5 MG tablet, Take 1.5 tablets (7.5 mg total) by mouth daily., Disp: 135 tablet, Rfl: 0  Allergies  Allergen Reactions  . Miralax [Polyethylene Glycol] Swelling and Rash    Took [facility] brand developed rash. Patient states she tolerated name brand MiraLax in the past.  09/01/15. Patient states she had diffuse swelling including swelling of her lips.   . Vancomycin Rash and Shortness Of Breath  . Acetaminophen Hives  . Oxycodone-Acetaminophen Itching  . Sulfa Antibiotics Rash    All over rash  . Sulfacetamide Sodium Rash    All over rash  . Sulfasalazine Rash    All over rash All over rash  . Banana Other (See Comments)    Unknown  . Latex Rash  . Tape Rash   Past Medical History:  Diagnosis Date  . AAA (abdominal aortic aneurysm) ([place])   . Acute diverticulitis   . Anxiety   . Arthritis   .  Atrial fibrillation ([place])    a. Dx 03/2011 - on tikosyn/coumadin.  [place] Kitchen CAD (coronary artery disease)    a. NSTEMI 02/2011: occ mid Cx, DES to OM2, residual nonobst LAD dz.  . Cancer ([place])    breast  . Diabetes mellitus   . Diverticulitis   . Diverticulitis of colon     2008, 04/2011, 12/2014, 08/2015  . 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 Hemangioma    liver  . HLD (hyperlipidemia)   . HTN (hypertension)   . Osteoporosis   . Tobacco abuse    stopped smoking 2012  . Ureteral obstruction    History of gross hematuria/right hydronephrosis 2/2 to uteropelvic junction obstruction, s/p cystoscopy in January  2007 with bilateral retrograde pyelography, right ureter arthroscopy, right ureteral stent placement, bladder biopsies, stent removal since then.  . Wears dentures    top    Observations/Objective: A&O  No respiratory distress or wheezing audible over the phone Mood, judgement, and thought processes all WNL  Assessment and Plan: 1. Suspected UTI - Education provided on UTI. Encouraged adequate hydration.  - ciprofloxacin (CIPRO) 500 MG tablet; Take 1 tablet (500 mg total) by mouth 2 (two) times daily for 7 days.  Dispense: 14 tablet; Refill: 0  2. Chronic anticoagulation - INR check on Monday since she is taking warfarin and I just prescribed Cipro due to UTI.    Follow Up Instructions:  I discussed the assessment and treatment plan with the patient. The patient was provided an opportunity to ask questions and all were answered. The patient agreed with the plan and demonstrated an understanding of the instructions.   The patient was advised to call back or seek an in-person evaluation if the symptoms worsen or if the condition fails to improve as anticipated.  The above assessment and management plan was discussed with the patient. The patient verbalized understanding of and has agreed to the management plan. Patient is aware to call the clinic if symptoms persist or worsen. Patient is aware when to return to the clinic for a follow-up visit. Patient educated on when it is appropriate to go to the emergency department.   Time call ended: 1:06 PM  I provided 14 minutes of non-face-to-face time during this encounter.  Hendricks Limes, MSN, APRN, FNP-C [facility] 09/30/19

## 2019-10-01 IMAGING — DX DG CHEST 1V PORT
1 series · 1 of 1 positions shown · non-contrast
Comparison: 1 day prior

CLINICAL DATA: Endotracheal tube

EXAM:
PORTABLE CHEST 1 VIEW

[chest ap]
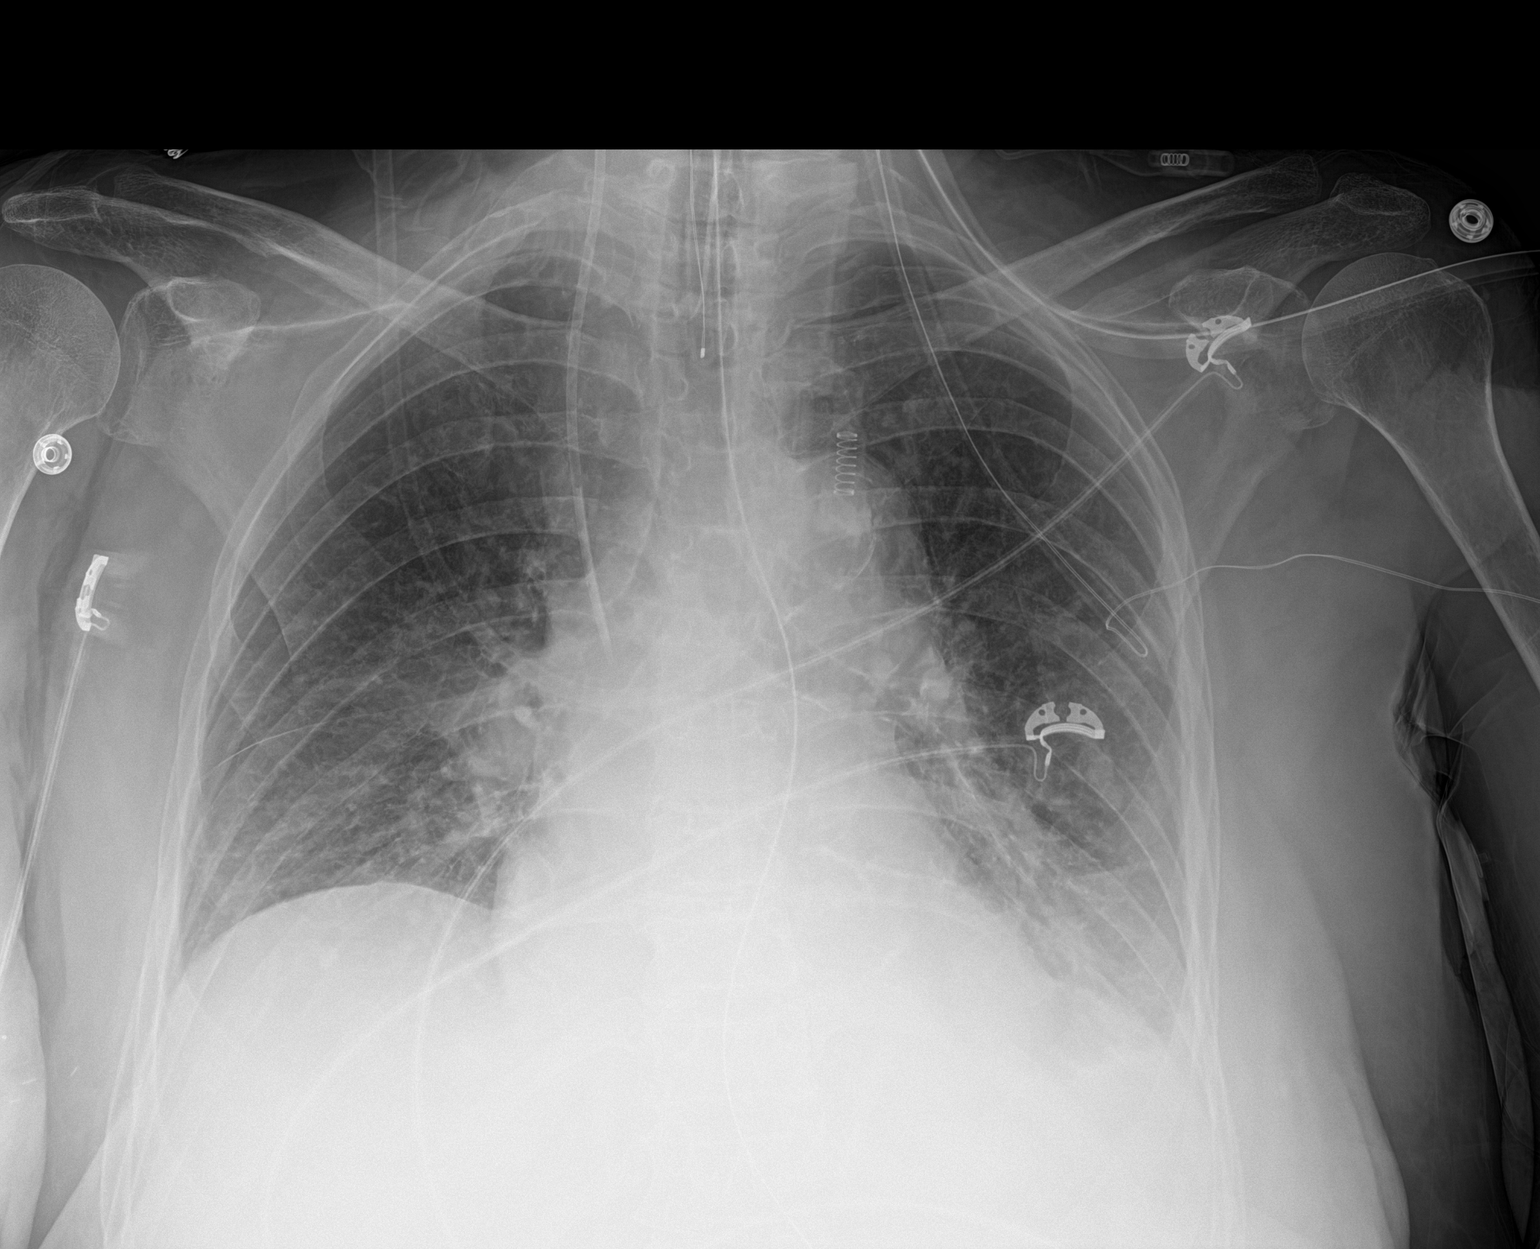

[1 of 1 positions shown; findings below may reference images not displayed]

FINDINGS: Endotracheal tube terminates 7.0 cm above carina.There may be an
overlying esophageal probe. Nasogastric tube extends beyond the
inferior aspect of the film. Right internal jugular line tip at low
SVC.

Midline trachea. Normal heart size. Atherosclerosis in the
transverse aorta. Small left pleural effusion persists. No
pneumothorax. Left greater than right base airspace disease.
IMPRESSION: Persistent left greater than right base airspace disease, favoring
atelectasis. Especially at the left lung base, pneumonia cannot be
excluded.

Endotracheal tube borderline high in position, 7.0 cm above carina.
This could be advanced 2-3 cm or re-evaluated at follow-up.

Aortic Atherosclerosis ([id number]).

Small left pleural effusion.

## 2019-10-01 IMAGING — DX DG CHEST 1V PORT
1 series · 1 of 1 positions shown · non-contrast
Comparison: 07/27/2018 and prior radiographs

CLINICAL DATA: Respiratory failure.

EXAM:
PORTABLE CHEST 1 VIEW

[chest ap]
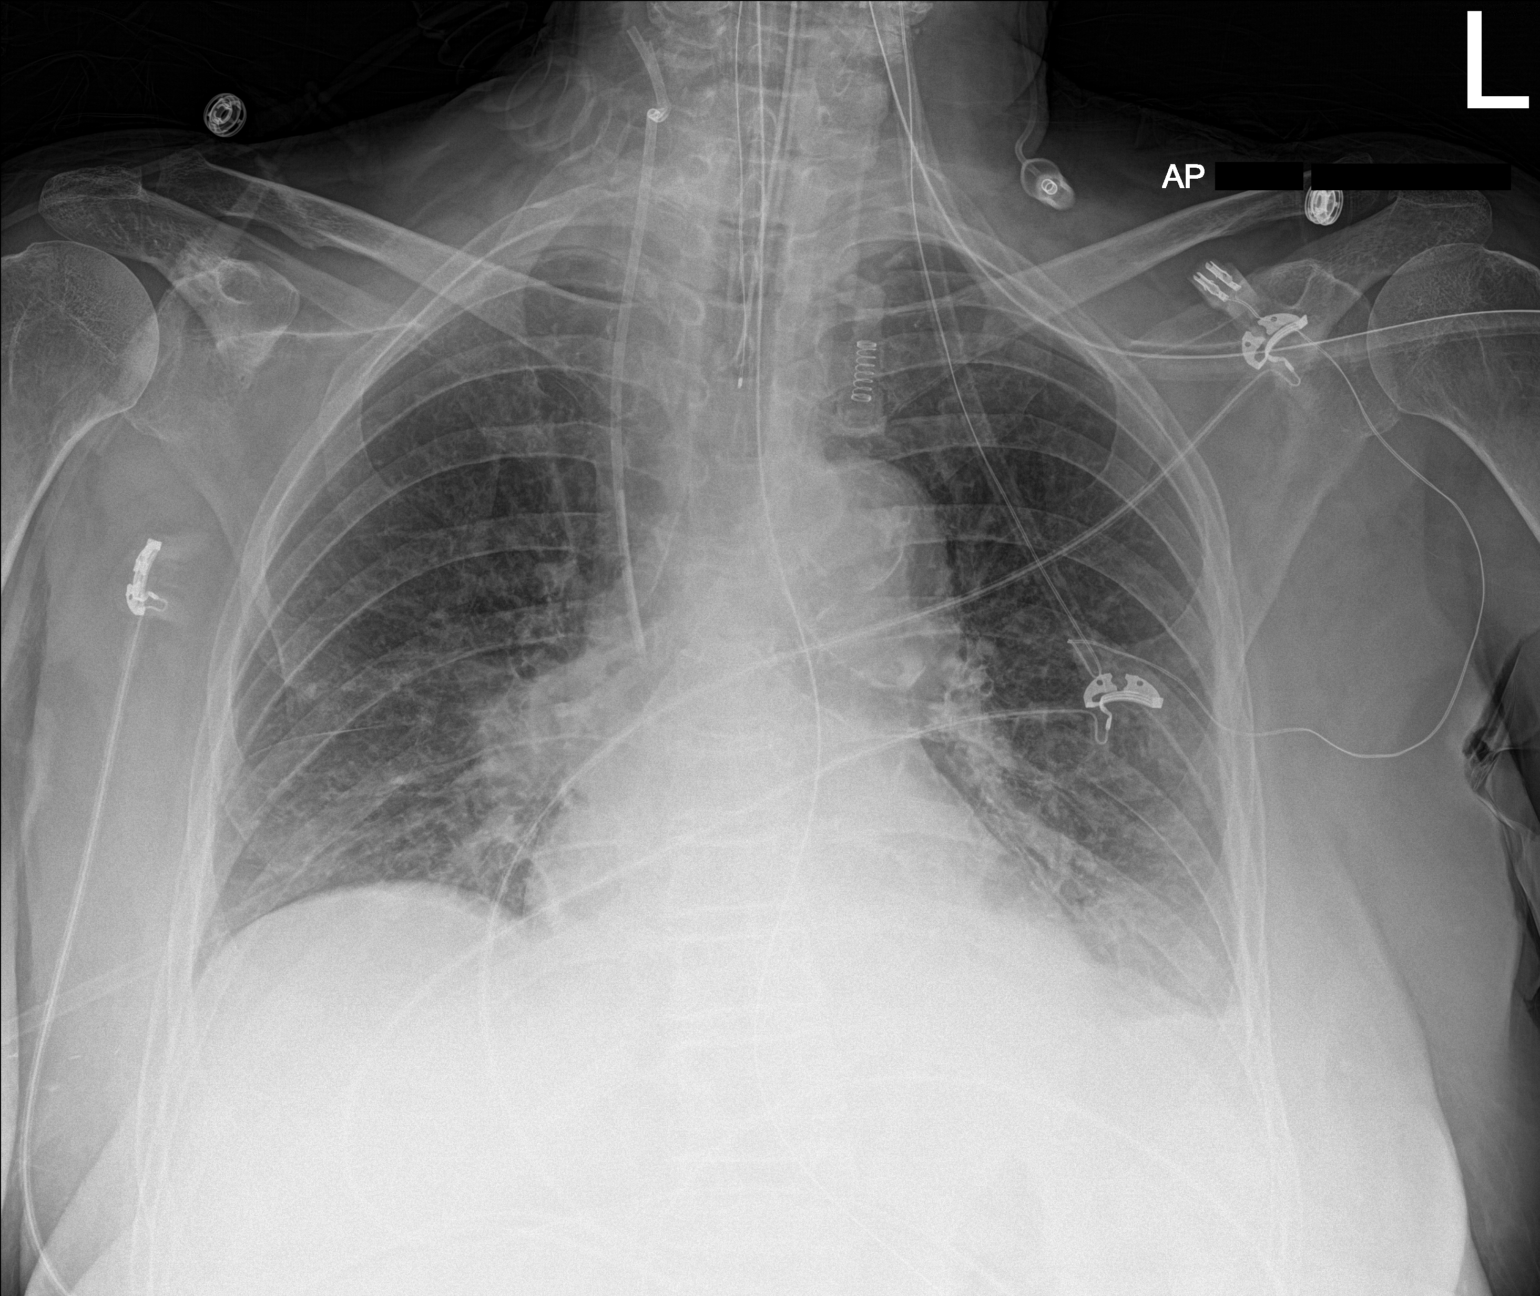

[1 of 1 positions shown; findings below may reference images not displayed]

FINDINGS: The cardiomediastinal silhouette is unchanged.

An endotracheal tube with tip 5.5 cm above the carina, NG tube
entering the stomach and RIGHT IJ central venous catheter with tip
overlying the LOWER SVC again noted.

Bilateral LOWER lung atelectasis/airspace disease,
LEFT-greater-than-RIGHT, again noted. Small LEFT effusion again
noted.

There is no evidence of pneumothorax.
IMPRESSION: Little significant change with support apparatus as described.

## 2019-10-02 ENCOUNTER — Other Ambulatory Visit: Payer: Self-pay

## 2019-10-05 ENCOUNTER — Ambulatory Visit (INDEPENDENT_AMBULATORY_CARE_PROVIDER_SITE_OTHER): Payer: Medicare Other | Admitting: Family Medicine

## 2019-10-05 ENCOUNTER — Other Ambulatory Visit: Payer: Self-pay

## 2019-10-05 ENCOUNTER — Encounter: Payer: Self-pay | Admitting: Family Medicine

## 2019-10-05 VITALS — BP 126/69 | HR 61 | Temp 96.6°F | Resp 20 | Ht 67.0 in | Wt 187.0 lb

## 2019-10-05 DIAGNOSIS — I4819 Other persistent atrial fibrillation: Secondary | ICD-10-CM

## 2019-10-05 LAB — COAGUCHEK XS/INR WAIVED
INR: 2.6 — ABNORMAL HIGH (ref 0.9–1.1)
Prothrombin Time: 31.2 s

## 2019-10-05 NOTE — Progress Notes (Signed)
Subjective:  Patient ID: Melody Casey, female    DOB: Feb 18, 1946  Age: 73 y.o. MRN: [id number]  CC: Anticoagulation   HPI Melody Casey presents for recheck of her INR.  She has been taking Cipro for the last several days.  There was concerned that she might have a bump in her INR during that usage.  She only has 1 more day left.  She is taking 5 mg on Saturday Sunday Tuesday Thursday and 7.5 mg on Monday Wednesday Friday.  INR today is 2.6 patient denies any shortness of breath chest pain or palpitations.  She has a history of atrial fibrillation.  Depression screen Melody Casey 2/9 10/05/2019 09/18/2019 06/24/2019  Decreased Interest 0 0 0  Down, Depressed, Hopeless 0 0 0  PHQ - 2 Score 0 0 0  Altered sleeping - - -  Tired, decreased energy - - -  Change in appetite - - -  Feeling bad or failure about yourself  - - -  Trouble concentrating - - -  Moving slowly or fidgety/restless - - -  Suicidal thoughts - - -  PHQ-9 Score - - -  Difficult doing work/chores - - -  Some recent data might be hidden    History Melody Casey has a past medical history of AAA (abdominal aortic aneurysm) ([place]), Acute diverticulitis, Anxiety, Arthritis, Atrial fibrillation ([place]), CAD (coronary artery disease), Cancer ([place]), Diabetes mellitus, Diverticulitis, Diverticulitis of colon ( ), Foot deformity (1947), Hemangioma, HLD (hyperlipidemia), HTN (hypertension), Osteoporosis, Tobacco abuse, Ureteral obstruction, and Wears dentures.   She has a past surgical history that includes Tonsillectomy and adenoidectomy; Knee arthroscopy; Tubal ligation; Right leg surgery (age 45); Ureteral surgery (2011); Coronary stent placement (2012); Cardiac catheterization (2012); Breast lumpectomy with needle localization and axillary sentinel lymph node bx (Right, 03/01/2014); Colonoscopy with propofol (N/A, 06/29/2014); Esophagogastroduodenoscopy (egd) with propofol (N/A, 11/01/2015); biopsy (N/A, 11/01/2015); Breast surgery; Colon resection (N/A,  07/25/2018); Partial colectomy (N/A, 07/25/2018); and laparotomy (N/A, 07/25/2018).   Her family history includes Cancer in her brother and father; Diabetes in her brother; Heart disease in her mother; Heart failure (age of onset: 40) in her mother; Hypertension in her brother; Lung cancer (age of onset: 74) in her father.She reports that she quit smoking about 8 years ago. Her smoking use included cigarettes. She has a 100.00 pack-year smoking history. She has never used smokeless tobacco. She reports that she does not drink alcohol or use drugs.    ROS Review of Systems Noncontributory except as per HPI. Objective:  BP 126/69   Pulse 61   Temp (!) 96.6 F (35.9 C)   Resp 20   Ht 5\' 7"  (1.702 m)   Wt 187 lb (84.8 kg)   SpO2 97%   BMI 29.29 kg/m   BP Readings from Last 3 Encounters:  10/05/19 126/69  09/18/19 136/81  07/30/19 (!) 100/58    Wt Readings from Last 3 Encounters:  10/05/19 187 lb (84.8 kg)  09/18/19 187 lb 3.2 oz (84.9 kg)  07/30/19 186 lb (84.4 kg)     Physical Exam Constitutional:      General: She is not in acute distress.    Appearance: She is well-developed.  Cardiovascular:     Rate and Rhythm: Normal rate. Rhythm irregularly irregular.  Pulmonary:     Breath sounds: Normal breath sounds.  Skin:    General: Skin is warm and dry.  Neurological:     Mental Status: She is alert and oriented to person, place, and  time.       Assessment & Plan:   Melody Casey was seen today for anticoagulation.  Diagnoses and all orders for this visit:  Persistent atrial fibrillation ([place])       I am having Melody Casey maintain her hydroxychloroquine, traMADol, glucose blood, magnesium oxide, amLODipine, dofetilide, ALPRAZolam, warfarin, atorvastatin, lisinopril, potassium chloride, ciprofloxacin, and alendronate.  Allergies as of 10/05/2019      Reactions   Miralax [polyethylene Glycol] Swelling, Rash   Took [facility] brand developed rash. Patient states she  tolerated name brand MiraLax in the past. 09/01/15. Patient states she had diffuse swelling including swelling of her lips.    Vancomycin Rash, Shortness Of Breath   Acetaminophen Hives   Oxycodone-acetaminophen Itching   Sulfa Antibiotics Rash   All over rash   Sulfacetamide Sodium Rash   All over rash   Sulfasalazine Rash   All over rash All over rash   Banana Other (See Comments)   Unknown   Latex Rash   Tape Rash      Medication List       Accurate as of October 05, 2019 12:33 PM. If you have any questions, ask your nurse or doctor.        alendronate 70 MG tablet Commonly known as: FOSAMAX Take 70 mg by mouth once a week. Take with a full glass of water on an empty stomach.   ALPRAZolam 0.25 MG tablet Commonly known as: XANAX Take 1 tablet (0.25 mg total) by mouth 3 (three) times daily as needed for anxiety.   amLODipine 2.5 MG tablet Commonly known as: NORVASC Take 1 tablet (2.5 mg total) by mouth daily.   atorvastatin 40 MG tablet Commonly known as: LIPITOR TAKE 1 & 1/2 (ONE & ONE-HALF) TABLETS BY MOUTH ONCE DAILY   ciprofloxacin 500 MG tablet Commonly known as: Cipro Take 1 tablet (500 mg total) by mouth 2 (two) times daily for 7 days.   dofetilide 250 MCG capsule Commonly known as: Tikosyn Take 1 capsule (250 mcg total) by mouth 2 (two) times daily.   glucose blood test strip Commonly known as: TRUEtest Test Check blood sugar daily   hydroxychloroquine 200 MG tablet Commonly known as: PLAQUENIL Take 1 tablet (200 mg total) by mouth 2 (two) times daily.   lisinopril 20 MG tablet Commonly known as: ZESTRIL Take 1 tablet by mouth once daily   magnesium oxide 400 MG tablet Commonly known as: MAG-OX Take 400 mg by mouth 2 (two) times daily.   potassium chloride 10 MEQ tablet Commonly known as: KLOR-CON Take 1 tablet by mouth once daily   traMADol 50 MG tablet Commonly known as: ULTRAM Take 1 tablet (50 mg total) by mouth every 6 (six) hours as  needed for moderate pain or severe pain.   warfarin 5 MG tablet Commonly known as: Coumadin Take as directed by the anticoagulation clinic. If you are unsure how to take this medication, talk to your nurse or doctor. Original instructions: Take 1.5 tablets (7.5 mg total) by mouth daily.        Follow-up: Return in about 1 month (around 11/05/2019), or if symptoms worsen or fail to improve.  Melody Casey, M.D.

## 2019-10-05 NOTE — Addendum Note (Signed)
Addended by: Zannie Cove on: 10/05/2019 01:49 PM   Modules accepted: Orders

## 2019-10-27 DIAGNOSIS — B351 Tinea unguium: Secondary | ICD-10-CM | POA: Diagnosis not present

## 2019-10-27 DIAGNOSIS — I70203 Unspecified atherosclerosis of native arteries of extremities, bilateral legs: Secondary | ICD-10-CM | POA: Diagnosis not present

## 2019-10-27 DIAGNOSIS — L84 Corns and callosities: Secondary | ICD-10-CM | POA: Diagnosis not present

## 2019-10-27 DIAGNOSIS — M79676 Pain in unspecified toe(s): Secondary | ICD-10-CM | POA: Diagnosis not present

## 2019-10-29 ENCOUNTER — Other Ambulatory Visit: Payer: Self-pay

## 2019-10-30 ENCOUNTER — Telehealth: Payer: Self-pay | Admitting: Family Medicine

## 2019-10-30 ENCOUNTER — Encounter: Payer: Self-pay | Admitting: Family Medicine

## 2019-10-30 ENCOUNTER — Ambulatory Visit (INDEPENDENT_AMBULATORY_CARE_PROVIDER_SITE_OTHER): Payer: Medicare Other | Admitting: Family Medicine

## 2019-10-30 VITALS — BP 143/76 | HR 63 | Temp 96.8°F | Ht 67.0 in | Wt 190.2 lb

## 2019-10-30 DIAGNOSIS — E1169 Type 2 diabetes mellitus with other specified complication: Secondary | ICD-10-CM

## 2019-10-30 DIAGNOSIS — F419 Anxiety disorder, unspecified: Secondary | ICD-10-CM | POA: Diagnosis not present

## 2019-10-30 DIAGNOSIS — F411 Generalized anxiety disorder: Secondary | ICD-10-CM

## 2019-10-30 DIAGNOSIS — Z7901 Long term (current) use of anticoagulants: Secondary | ICD-10-CM

## 2019-10-30 DIAGNOSIS — F329 Major depressive disorder, single episode, unspecified: Secondary | ICD-10-CM | POA: Diagnosis not present

## 2019-10-30 DIAGNOSIS — E118 Type 2 diabetes mellitus with unspecified complications: Secondary | ICD-10-CM

## 2019-10-30 DIAGNOSIS — Z79899 Other long term (current) drug therapy: Secondary | ICD-10-CM | POA: Diagnosis not present

## 2019-10-30 DIAGNOSIS — I4819 Other persistent atrial fibrillation: Secondary | ICD-10-CM

## 2019-10-30 DIAGNOSIS — I4891 Unspecified atrial fibrillation: Secondary | ICD-10-CM

## 2019-10-30 LAB — COAGUCHEK XS/INR WAIVED
INR: 2.4 — ABNORMAL HIGH (ref 0.9–1.1)
Prothrombin Time: 29.1 s

## 2019-10-30 LAB — BAYER DCA HB A1C WAIVED: HB A1C (BAYER DCA - WAIVED): 7 % — ABNORMAL HIGH (ref <7.0)

## 2019-10-30 MED ORDER — ALPRAZOLAM 0.25 MG PO TABS: 0.2500 mg | ORAL_TABLET | Freq: Three times a day (TID) | ORAL | 2 refills | Status: DC | PRN

## 2019-10-30 NOTE — Progress Notes (Signed)
BP (!) 143/76   Pulse 63   Temp (!) 96.8 F (36 C) (Temporal)   Ht 5\' 7"  (1.702 m)   Wt 190 lb 3.2 oz (86.3 kg)   SpO2 96%   BMI 29.79 kg/m    Subjective:   Patient ID: Melody Casey, female    DOB: [birth year], 73 y.o.   MRN: [id number]  HPI: Melody Casey is a 73 y.o. female presenting on 10/30/2019 for Coagulation Disorder (Patient is concerned with her weight gain ) and Diabetes   HPI Type 2 diabetes mellitus Patient comes in today for recheck of his diabetes. Patient has been currently taking no medication is diet controlled. Patient is currently on an ACE inhibitor/ARB. Patient has not seen an ophthalmologist this year. Patient denies any issues with their feet.   Anxiety Patient is coming in for anxiety recheck.  She only uses her Xanax as needed and infrequently needs refills. Current rx-Xanax 0.25 3 times daily. # meds rx-30 Effectiveness of current meds-working well, only uses it as needed Adverse reactions form meds-none  Pill count performed-No Last drug screen -N/A ( high risk q35m, moderate risk q65m, low risk yearly ) Urine drug screen today- Yes Was the [place] reviewed-yes  If yes were their any concerning findings? -None  No flowsheet data found.   Controlled substance contract signed on: Today  Coumadin recheck Target goal: 2.0-3.0 Reason on anticoagulation: A. fib Patient denies any bruising or bleeding or chest pain or palpitations   Relevant past medical, surgical, family and social history reviewed and updated as indicated. Interim medical history since our last visit reviewed. Allergies and medications reviewed and updated.  Review of Systems  Constitutional: Negative for chills and fever.  Eyes: Negative for visual disturbance.  Respiratory: Negative for chest tightness and shortness of breath.   Cardiovascular: Negative for chest pain and leg swelling.  Musculoskeletal: Negative for back pain and gait problem.  Skin: Negative for rash.   Neurological: Negative for light-headedness and headaches.  Psychiatric/Behavioral: Negative for agitation, behavioral problems, decreased concentration, dysphoric mood, self-injury, sleep disturbance and suicidal ideas. The patient is nervous/anxious.   All other systems reviewed and are negative.   Per HPI unless specifically indicated above   Allergies as of 10/30/2019      Reactions   Miralax [polyethylene Glycol] Swelling, Rash   Took [facility] brand developed rash. Patient states she tolerated name brand MiraLax in the past. 09/01/15. Patient states she had diffuse swelling including swelling of her lips.    Vancomycin Rash, Shortness Of Breath   Acetaminophen Hives   Oxycodone-acetaminophen Itching   Sulfa Antibiotics Rash   All over rash   Sulfacetamide Sodium Rash   All over rash   Sulfasalazine Rash   All over rash All over rash   Banana Other (See Comments)   Unknown   Latex Rash   Tape Rash      Medication List       Accurate as of October 30, 2019 11:59 PM. If you have any questions, ask your nurse or doctor.        alendronate 70 MG tablet Commonly known as: FOSAMAX Take 70 mg by mouth once a week. Take with a full glass of water on an empty stomach.   ALPRAZolam 0.25 MG tablet Commonly known as: XANAX Take 1 tablet (0.25 mg total) by mouth 3 (three) times daily as needed for anxiety.   amLODipine 2.5 MG tablet Commonly known as: NORVASC Take 1 tablet (2.5  mg total) by mouth daily.   atorvastatin 40 MG tablet Commonly known as: LIPITOR TAKE 1 & 1/2 (ONE & ONE-HALF) TABLETS BY MOUTH ONCE DAILY   dofetilide 250 MCG capsule Commonly known as: Tikosyn Take 1 capsule (250 mcg total) by mouth 2 (two) times daily.   glucose blood test strip Commonly known as: TRUEtest Test Check blood sugar daily   hydroxychloroquine 200 MG tablet Commonly known as: PLAQUENIL Take 1 tablet (200 mg total) by mouth 2 (two) times daily.   lisinopril 20 MG tablet  Commonly known as: ZESTRIL Take 1 tablet by mouth once daily   magnesium oxide 400 MG tablet Commonly known as: MAG-OX Take 400 mg by mouth 2 (two) times daily.   potassium chloride 10 MEQ tablet Commonly known as: KLOR-CON Take 1 tablet by mouth once daily   traMADol 50 MG tablet Commonly known as: ULTRAM Take 1 tablet (50 mg total) by mouth every 6 (six) hours as needed for moderate pain or severe pain.   warfarin 5 MG tablet Commonly known as: Coumadin Take as directed by the anticoagulation clinic. If you are unsure how to take this medication, talk to your nurse or doctor. Original instructions: Take 1.5 tablets (7.5 mg total) by mouth daily.        Objective:   BP (!) 143/76   Pulse 63   Temp (!) 96.8 F (36 C) (Temporal)   Ht 5\' 7"  (1.702 m)   Wt 190 lb 3.2 oz (86.3 kg)   SpO2 96%   BMI 29.79 kg/m   Wt Readings from Last 3 Encounters:  10/30/19 190 lb 3.2 oz (86.3 kg)  10/05/19 187 lb (84.8 kg)  09/18/19 187 lb 3.2 oz (84.9 kg)    Physical Exam Vitals signs and nursing note reviewed.  Constitutional:      General: She is not in acute distress.    Appearance: She is well-developed. She is not diaphoretic.  Eyes:     Conjunctiva/sclera: Conjunctivae normal.  Cardiovascular:     Rate and Rhythm: Normal rate and regular rhythm.     Heart sounds: Normal heart sounds. No murmur.  Pulmonary:     Effort: Pulmonary effort is normal. No respiratory distress.     Breath sounds: Normal breath sounds. No wheezing.  Musculoskeletal: Normal range of motion.        General: No tenderness.  Skin:    General: Skin is warm and dry.     Findings: No rash.  Neurological:     Mental Status: She is alert and oriented to person, place, and time.     Coordination: Coordination normal.  Psychiatric:        Mood and Affect: Mood is anxious and depressed.        Behavior: Behavior normal.        Thought Content: Thought content does not include suicidal ideation. Thought  content does not include suicidal plan.       Description   Continue to take warfarin to 7.5 mg  Monday, Wednesday, and Friday. Continue warfarin 5 mg all other days.   INR 2.4 today, normal range 2.0-3.0 Recheck in 4 to 6 weeks      Assessment & Plan:   Problem List Items Addressed This Visit      Cardiovascular and Mediastinum   Persistent atrial fibrillation (HCC)   Atrial fibrillation ([place])     Endocrine   Type 2 diabetes mellitus with complication, without long-term current use of insulin ([place])  Other   High risk medication use   Anxiety and depression   Relevant Medications   ALPRAZolam (XANAX) 0.25 MG tablet   Anxiety state   Relevant Medications   ALPRAZolam (XANAX) 0.25 MG tablet   Chronic anticoagulation   Relevant Orders   inr FINGERSTICK (Completed)    Other Visit Diagnoses    Type 2 diabetes mellitus with other specified complication, without long-term current use of insulin ([place])    -  Primary   Relevant Orders   hgba1c (Completed)   Controlled substance agreement signed       Relevant Orders   DRUG SCREEN-TOXASSURE      Continue current dose of Coumadin and will check A1c, continue with current medication otherwise. Follow up plan: Return in about 3 months (around 01/30/2020), or if symptoms worsen or fail to improve, for Anxiety and anticoagulation and diabetes.  Counseling provided for all of the vaccine components Orders Placed This Encounter  Procedures  . inr FINGERSTICK  . hgba1c  . DRUG SCREEN-TOXASSURE    [name], MD [facility] 11/03/2019, 8:42 PM

## 2019-10-30 NOTE — Telephone Encounter (Signed)
Pt called and aware was 7.0.

## 2019-11-04 ENCOUNTER — Ambulatory Visit: Payer: Medicare Other | Admitting: Family Medicine

## 2019-11-04 LAB — TOXASSURE SELECT 13 (MW), URINE

## 2019-11-16 ENCOUNTER — Telehealth: Payer: Self-pay | Admitting: Family Medicine

## 2019-11-16 MED ORDER — WARFARIN SODIUM 5 MG PO TABS: 7.5000 mg | ORAL_TABLET | Freq: Every day | ORAL | 0 refills | Status: DC

## 2019-11-16 NOTE — Telephone Encounter (Signed)
What is the name of the medication? warfarin (COUMADIN) 5 MG tablet  Have you contacted your pharmacy to request a refill? No. Pt is no longer [facility]  Which pharmacy would you like this sent to? [facility] in [place]   Patient notified that their request is being sent to the clinical staff for review and that they should receive a call once it is complete. If they do not receive a call within 24 hours they can check with their pharmacy or our office.

## 2019-11-16 NOTE — Telephone Encounter (Signed)
Sent to new pharmacy, pt aware

## 2019-11-18 ENCOUNTER — Other Ambulatory Visit: Payer: Self-pay | Admitting: Cardiology

## 2019-11-18 MED ORDER — ATORVASTATIN CALCIUM 40 MG PO TABS: ORAL_TABLET | ORAL | 0 refills | Status: DC

## 2019-11-18 MED ORDER — AMLODIPINE BESYLATE 2.5 MG PO TABS: 2.5000 mg | ORAL_TABLET | Freq: Every day | ORAL | 0 refills | Status: DC

## 2019-11-18 NOTE — Telephone Encounter (Signed)
° ° °*  STAT* If patient is at the pharmacy, call can be transferred to refill team.   1. Which medications need to be refilled? (please list name of each medication and dose if known)  amLODipine (NORVASC) 2.5 MG tablet atorvastatin (LIPITOR) 40 MG tablet  2. Which pharmacy/location (including street and city if local pharmacy) is medication to be sent to? [id number] - [place],  - [address]  3. Do they need a 30 day or 90 day supply? Melody Casey

## 2019-11-19 DIAGNOSIS — M1712 Unilateral primary osteoarthritis, left knee: Secondary | ICD-10-CM | POA: Diagnosis not present

## 2019-11-23 ENCOUNTER — Other Ambulatory Visit: Payer: Self-pay

## 2019-11-23 ENCOUNTER — Other Ambulatory Visit: Payer: Self-pay | Admitting: Cardiology

## 2019-11-23 NOTE — Telephone Encounter (Signed)
*  STAT* If patient is at the pharmacy, call can be transferred to refill team.   1. Which medications need to be refilled? (please list name of each medication and dose if known)   amLODipine (NORVASC) 2.5 MG tablet  atorvastatin (LIPITOR) 40 MG tablet  2. Which pharmacy/location (including street and city if local pharmacy) is medication to be sent to?[id number] - [place], [place] - [address]  3. Do they need a 30 day or 90 day supply? 90 day

## 2019-11-24 ENCOUNTER — Ambulatory Visit (INDEPENDENT_AMBULATORY_CARE_PROVIDER_SITE_OTHER): Payer: Medicare Other | Admitting: Family Medicine

## 2019-11-24 ENCOUNTER — Encounter: Payer: Self-pay | Admitting: Family Medicine

## 2019-11-24 VITALS — BP 155/84 | HR 72 | Temp 96.6°F | Ht 67.0 in | Wt 190.8 lb

## 2019-11-24 DIAGNOSIS — Z7901 Long term (current) use of anticoagulants: Secondary | ICD-10-CM

## 2019-11-24 DIAGNOSIS — Z79899 Other long term (current) drug therapy: Secondary | ICD-10-CM | POA: Diagnosis not present

## 2019-11-24 DIAGNOSIS — I4819 Other persistent atrial fibrillation: Secondary | ICD-10-CM

## 2019-11-24 LAB — COAGUCHEK XS/INR WAIVED
INR: 2.3 — ABNORMAL HIGH (ref 0.9–1.1)
Prothrombin Time: 27.6 s

## 2019-11-24 MED ORDER — LISINOPRIL 20 MG PO TABS: 20.0000 mg | ORAL_TABLET | Freq: Every day | ORAL | 1 refills | Status: DC

## 2019-11-24 NOTE — Progress Notes (Signed)
Assessment & Plan:  1-3. Chronic anticoagulation/Persistent atrial fibrillation (HCC)/High risk medication use - inr FINGERSTICK Description   Continue to take warfarin to 7.5 mg  Monday, Wednesday, and Friday. Continue warfarin 5 mg all other days.   INR 2.3 today, normal range 2.0-3.0  Recheck in 4 to 6 weeks     Follow up plan: Return in about 6 weeks (around 01/05/2020) for INR.  Hendricks Limes, MSN, APRN, FNP-C [facility]  Subjective:   Patient ID: Melody Casey, female    DOB: May 31, 1946, 73 y.o.   MRN: [id number]  HPI: Melody Casey is a 73 y.o. female presenting on 11/24/2019 for Coagulation Disorder and Hypertension (check up)  Anticoagulation: Patient here for anticoagulation monitoring. Indication: atrial fibrillation Bleeding Signs/Symptoms:  None Thromboembolic Signs/Symptoms:  None  Missed Coumadin Doses:  None Medication Changes:  no Dietary Changes:  no Bacterial/Viral Infection:  no  Other Concerns:  no   ROS: Negative unless specifically indicated above in HPI.   Relevant past medical history reviewed and updated as indicated.   Allergies and medications reviewed and updated.   Current Outpatient Medications:  .  alendronate (FOSAMAX) 70 MG tablet, Take 70 mg by mouth once a week. Take with a full glass of water on an empty stomach., Disp: , Rfl:  .  ALPRAZolam (XANAX) 0.25 MG tablet, Take 1 tablet (0.25 mg total) by mouth 3 (three) times daily as needed for anxiety., Disp: 30 tablet, Rfl: 2 .  amLODipine (NORVASC) 2.5 MG tablet, Take 1 tablet (2.5 mg total) by mouth daily., Disp: 30 tablet, Rfl: 0 .  atorvastatin (LIPITOR) 40 MG tablet, TAKE 1 & 1/2 (ONE & ONE-HALF) TABLETS BY MOUTH ONCE DAILY, Disp: 45 tablet, Rfl: 0 .  dofetilide (TIKOSYN) 250 MCG capsule, Take 1 capsule (250 mcg total) by mouth 2 (two) times daily., Disp: 180 capsule, Rfl: 3 .  glucose blood (TRUETEST TEST) test strip, Check blood sugar daily, Disp: 100  each, Rfl: 3 .  hydroxychloroquine (PLAQUENIL) 200 MG tablet, Take 1 tablet (200 mg total) by mouth 2 (two) times daily., Disp: 60 tablet, Rfl: 0 .  lisinopril (ZESTRIL) 20 MG tablet, Take 1 tablet (20 mg total) by mouth daily., Disp: 90 tablet, Rfl: 1 .  magnesium oxide (MAG-OX) 400 MG tablet, Take 400 mg by mouth 2 (two) times daily., Disp: , Rfl:  .  potassium chloride (K-DUR) 10 MEQ tablet, Take 1 tablet by mouth once daily, Disp: 30 tablet, Rfl: 10 .  traMADol (ULTRAM) 50 MG tablet, Take 1 tablet (50 mg total) by mouth every 6 (six) hours as needed for moderate pain or severe pain., Disp: 30 tablet, Rfl: 0 .  warfarin (COUMADIN) 5 MG tablet, Take 1.5 tablets (7.5 mg total) by mouth daily., Disp: 135 tablet, Rfl: 0  Allergies  Allergen Reactions  . Miralax [Polyethylene Glycol] Swelling and Rash    Took [facility] brand developed rash. Patient states she tolerated name brand MiraLax in the past.  09/01/15. Patient states she had diffuse swelling including swelling of her lips.   . Vancomycin Rash and Shortness Of Breath  . Acetaminophen Hives  . Oxycodone-Acetaminophen Itching  . Sulfa Antibiotics Rash    All over rash  . Sulfacetamide Sodium Rash    All over rash  . Sulfasalazine Rash    All over rash All over rash  . Banana Other (See Comments)    Unknown  . Latex Rash  . Tape Rash    Objective:  BP (!) 155/84   Pulse 72   Temp (!) 96.6 F (35.9 C) (Temporal)   Ht 5\' 7"  (1.702 m)   Wt 190 lb 12.8 oz (86.5 kg)   SpO2 94%   BMI 29.88 kg/m    Physical Exam Vitals reviewed.  Constitutional:      General: She is not in acute distress.    Appearance: Normal appearance. She is overweight. She is not ill-appearing, toxic-appearing or diaphoretic.  HENT:     Head: Normocephalic and atraumatic.  Eyes:     General: No scleral icterus.       Right eye: No discharge.        Left eye: No discharge.     Conjunctiva/sclera: Conjunctivae normal.  Cardiovascular:     Rate and  Rhythm: Normal rate and regular rhythm.     Heart sounds: Normal heart sounds. No murmur. No friction rub. No gallop.   Pulmonary:     Effort: Pulmonary effort is normal. No respiratory distress.     Breath sounds: Normal breath sounds. No stridor. No wheezing, rhonchi or rales.  Musculoskeletal:        General: Normal range of motion.     Cervical back: Normal range of motion.  Skin:    General: Skin is warm and dry.     Capillary Refill: Capillary refill takes less than 2 seconds.  Neurological:     General: No focal deficit present.     Mental Status: She is alert and oriented to person, place, and time. Mental status is at baseline.  Psychiatric:        Mood and Affect: Mood normal.        Behavior: Behavior normal.        Thought Content: Thought content normal.        Judgment: Judgment normal.

## 2019-11-25 MED ORDER — AMLODIPINE BESYLATE 2.5 MG PO TABS: 2.5000 mg | ORAL_TABLET | Freq: Every day | ORAL | 0 refills | Status: DC

## 2019-11-25 MED ORDER — ATORVASTATIN CALCIUM 40 MG PO TABS: ORAL_TABLET | ORAL | 0 refills | Status: DC

## 2019-11-25 NOTE — Telephone Encounter (Signed)
Contacted patient and scheduled yearly visit with Dr Percival Spanish and sent refills of rx to the pharmacy.

## 2019-11-30 ENCOUNTER — Other Ambulatory Visit: Payer: Self-pay | Admitting: Cardiology

## 2019-12-17 NOTE — Progress Notes (Signed)
REVIEWED-NO ADDITIONAL RECOMMENDATIONS. 

## 2019-12-23 ENCOUNTER — Other Ambulatory Visit: Payer: Self-pay | Admitting: Cardiology

## 2019-12-27 DIAGNOSIS — Z7189 Other specified counseling: Secondary | ICD-10-CM | POA: Insufficient documentation

## 2019-12-27 NOTE — Progress Notes (Signed)
Cardiology Office Note   Date:  12/28/2019   ID:  Melody Casey, DOB [birth year], MRN [id number]  PCP:  Dettinger, [name], MD  Cardiologist:   Minus Breeding, MD   No chief complaint on file.     History of Present Illness: Melody Casey is a 74 y.o. female who presents for ongoing assessment and management of coronary artery disease, and history of paroxysmal atrial fibrillation.  She is done okay from a cardiovascular standpoint.  She has some very rare palpitations but does not think her heart is out of rhythm in sustained atrial fibrillation ever.  She is limited by left knee pain and right now cannot get elective surgery because of the virus.  She was also thinking of getting her colostomy taken down but this is on hold.  She denies any chest pressure, neck or arm discomfort.  She had no new shortness of breath, PND or orthopnea.  She has no weight gain or edema.   Past Medical History:  Diagnosis Date  . AAA (abdominal aortic aneurysm) ([place])   . Acute diverticulitis   . Anxiety   . Arthritis   . Atrial fibrillation ([place])    a. Dx 03/2011 - on tikosyn/coumadin.  Marland Kitchen CAD (coronary artery disease)    a. NSTEMI 02/2011: occ mid Cx, DES to OM2, residual nonobst LAD dz.  . Cancer ([place])    breast  . Diabetes mellitus   . Diverticulitis   . Diverticulitis of colon     2008, 04/2011, 12/2014, 08/2015  . 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 Hemangioma    liver  . HLD (hyperlipidemia)   . HTN (hypertension)   . Osteoporosis   . Tobacco abuse    stopped smoking 2012  . Ureteral obstruction    History of gross hematuria/right hydronephrosis 2/2 to uteropelvic junction obstruction, s/p cystoscopy in January 2007 with bilateral retrograde pyelography, right ureter arthroscopy, right ureteral stent placement, bladder biopsies, stent removal since then.  . Wears dentures    top    Past Surgical History:  Procedure Laterality Date  . BIOPSY N/A 11/01/2015   Procedure:  BIOPSY;  Surgeon: Danie Binder, MD;  Location: [facility];  Service: Endoscopy;  Laterality: N/A;  . BREAST LUMPECTOMY WITH NEEDLE LOCALIZATION AND AXILLARY SENTINEL LYMPH NODE BX Right 03/01/2014   Procedure: BREAST LUMPECTOMY WITH NEEDLE LOCALIZATION AND AXILLARY SENTINEL LYMPH NODE BX;  Surgeon: Edward Jolly, MD;  Location: [facility];  Service: General;  Laterality: Right;  . BREAST SURGERY    . CARDIAC CATHETERIZATION  2012   stent  . COLON RESECTION N/A 07/25/2018   Procedure: COLOSTOMY;  Surgeon: Rolm Bookbinder, MD;  Location: [facility];  Service: General;  Laterality: N/A;  . COLONOSCOPY WITH PROPOFOL N/A 06/29/2014   Dr. Wynetta Emery: universal diverticulosis  . CORONARY STENT PLACEMENT  2012  . ESOPHAGOGASTRODUODENOSCOPY (EGD) WITH PROPOFOL N/A 11/01/2015   Procedure: ESOPHAGOGASTRODUODENOSCOPY (EGD) WITH PROPOFOL;  Surgeon: Danie Binder, MD;  Location: [facility];  Service: Endoscopy;  Laterality: N/A;  . KNEE ARTHROSCOPY     left  . LAPAROTOMY N/A 07/25/2018   Procedure: EXPLORATORY LAPAROTOMY;  Surgeon: Rolm Bookbinder, MD;  Location: [facility];  Service: General;  Laterality: N/A;  . PARTIAL COLECTOMY N/A 07/25/2018   Procedure: COLECTOMY;  Surgeon: Rolm Bookbinder, MD;  Location: [facility];  Service: General;  Laterality: N/A;  . Right leg surgery  age 71   As a child for  ?  scleroderma per patient  . TONSILLECTOMY AND ADENOIDECTOMY    . TUBAL LIGATION    . Ureteral surgery  2011   rt ureterostomy-stent     Current Outpatient Medications  Medication Sig Dispense Refill  . alendronate (FOSAMAX) 70 MG tablet Take 70 mg by mouth once a week. Take with a full glass of water on an empty stomach.    . ALPRAZolam (XANAX) 0.25 MG tablet Take 1 tablet (0.25 mg total) by mouth 3 (three) times daily as needed for anxiety. 30 tablet 2  . amLODipine (NORVASC) 5 MG tablet Take 0.5 tablets (2.5 mg total) by mouth daily. 90 tablet 3  . atorvastatin (LIPITOR) 40 MG tablet TAKE 1  AND 1/2 TABLETS BY MOUTH EVERY DAY 45 tablet 3  . dofetilide (TIKOSYN) 250 MCG capsule Take 1 capsule (250 mcg total) by mouth 2 (two) times daily. 180 capsule 3  . glucose blood (TRUETEST TEST) test strip Check blood sugar daily 100 each 3  . hydroxychloroquine (PLAQUENIL) 200 MG tablet Take 1 tablet (200 mg total) by mouth 2 (two) times daily. 60 tablet 0  . lisinopril (ZESTRIL) 20 MG tablet Take 1 tablet (20 mg total) by mouth daily. 90 tablet 1  . magnesium oxide (MAG-OX) 400 MG tablet Take 400 mg by mouth 2 (two) times daily.    . potassium chloride (K-DUR) 10 MEQ tablet Take 1 tablet by mouth once daily 30 tablet 10  . traMADol (ULTRAM) 50 MG tablet Take 1 tablet (50 mg total) by mouth every 6 (six) hours as needed for moderate pain or severe pain. (Patient not taking: Reported on 12/28/2019) 30 tablet 0  . warfarin (COUMADIN) 5 MG tablet Take 1.5 tablets (7.5 mg total) by mouth daily. 135 tablet 0   No current facility-administered medications for this visit.    Allergies:   Miralax [polyethylene glycol], Vancomycin, Acetaminophen, Oxycodone-acetaminophen, Sulfa antibiotics, Sulfacetamide sodium, Sulfasalazine, Banana, Latex, and Tape     ROS:  Please see the history of present illness.   Otherwise, review of systems are positive for none.   All other systems are reviewed and negative.    PHYSICAL EXAM: VS:  BP (!) 160/85   Pulse 62   Temp (!) 97.1 F (36.2 C)   Ht 5\' 7"  (1.702 m)   Wt 193 lb 12.8 oz (87.9 kg)   SpO2 96%   BMI 30.35 kg/m  , BMI Body mass index is 30.35 kg/m. GENERAL:  Well appearing NECK:  No jugular venous distention, waveform within normal limits, carotid upstroke brisk and symmetric, no bruits, no thyromegaly LUNGS:  Clear to auscultation bilaterally CHEST:  Unremarkable HEART:  PMI not displaced or sustained,S1 and S2 within normal limits, no S3, no S4, no clicks, no rubs, no murmurs ABD:  Flat, positive bowel sounds normal in frequency in pitch, no  bruits, no rebound, no guarding, no midline pulsatile mass, no hepatomegaly, no splenomegaly, colostomy bag.  EXT:  2 plus pulses throughout, no edema, no cyanosis no clubbing   EKG:  EKG is ordered today. The ekg ordered today demonstrates sinus rhythm, rate 62, axis within normal limits, intervals within normal limits, no acute ST-T wave changes.   Recent Labs: 06/15/2019: Hemoglobin 12.6; Platelet Count 174 08/26/2019: ALT 12; BUN 12; Creatinine, Ser 0.75; Potassium 4.3; Sodium 141    Lipid Panel    Component Value Date/Time   CHOL 136 08/26/2019 1023   TRIG 106 08/26/2019 1023   HDL 45 08/26/2019 1023   CHOLHDL 3.0 08/26/2019  1023   CHOLHDL 6.2 02/14/2011 0330   VLDL 26 02/14/2011 0330   LDLCALC 71 08/26/2019 1023   LDLDIRECT 158.6 04/05/2008 0833      Wt Readings from Last 3 Encounters:  12/28/19 193 lb 12.8 oz (87.9 kg)  11/24/19 190 lb 12.8 oz (86.5 kg)  10/30/19 190 lb 3.2 oz (86.3 kg)      Other studies Reviewed: Additional studies/ records that were reviewed today include: None. Review of the above records demonstrates: NA   ASSESSMENT AND PLAN:  Atrial fibrillation:    Patient tolerates the current antiarrhythmic.  QTC is okay.  I will check a potassium and magnesium today.  No change in therapy.   Hypercholesterolemia:   Lipids in September demonstrated an LDL 71 and HDL of 45.  No change in therapy.   Hypertension:   Her blood pressures not at target.  I am going to increase her amlodipine to 5 mg daily.   Covid education: She was worried about the vaccine because of her allergies. She is going to try to schedule.   Current medicines are reviewed at length with the patient today.  The patient does not have concerns regarding medicines.  The following changes have been made:  As above  Labs/ tests ordered today include: None  Orders Placed This Encounter  Procedures  . Basic Metabolic Panel (BMET)  . Magnesium  . EKG 12-Lead     Disposition:    FU with me in six months.     Signed, Minus Breeding, MD  12/28/2019 11:27 AM    [place]

## 2019-12-28 ENCOUNTER — Encounter: Payer: Self-pay | Admitting: Cardiology

## 2019-12-28 ENCOUNTER — Ambulatory Visit (INDEPENDENT_AMBULATORY_CARE_PROVIDER_SITE_OTHER): Payer: Medicare Other | Admitting: Cardiology

## 2019-12-28 ENCOUNTER — Other Ambulatory Visit: Payer: Self-pay

## 2019-12-28 VITALS — BP 160/85 | HR 62 | Temp 97.1°F | Ht 67.0 in | Wt 193.8 lb

## 2019-12-28 DIAGNOSIS — Z79899 Other long term (current) drug therapy: Secondary | ICD-10-CM | POA: Diagnosis not present

## 2019-12-28 DIAGNOSIS — Z7189 Other specified counseling: Secondary | ICD-10-CM | POA: Diagnosis not present

## 2019-12-28 DIAGNOSIS — I4819 Other persistent atrial fibrillation: Secondary | ICD-10-CM

## 2019-12-28 DIAGNOSIS — I10 Essential (primary) hypertension: Secondary | ICD-10-CM

## 2019-12-28 DIAGNOSIS — E785 Hyperlipidemia, unspecified: Secondary | ICD-10-CM | POA: Diagnosis not present

## 2019-12-28 LAB — BASIC METABOLIC PANEL
BUN/Creatinine Ratio: 20 (ref 12–28)
BUN: 17 mg/dL (ref 8–27)
CO2: 21 mmol/L (ref 20–29)
Calcium: 10.4 mg/dL — ABNORMAL HIGH (ref 8.7–10.3)
Chloride: 102 mmol/L (ref 96–106)
Creatinine, Ser: 0.83 mg/dL (ref 0.57–1.00)
GFR calc Af Amer: 80 mL/min/{1.73_m2} (ref >59)
GFR calc non Af Amer: 69 mL/min/{1.73_m2} (ref >59)
Glucose: 100 mg/dL — ABNORMAL HIGH (ref 65–99)
Potassium: 4.5 mmol/L (ref 3.5–5.2)
Sodium: 137 mmol/L (ref 134–144)

## 2019-12-28 LAB — MAGNESIUM: Magnesium: 2 mg/dL (ref 1.6–2.3)

## 2019-12-28 MED ORDER — AMLODIPINE BESYLATE 5 MG PO TABS: 2.5000 mg | ORAL_TABLET | Freq: Every day | ORAL | 3 refills | Status: DC

## 2019-12-28 MED ORDER — DOFETILIDE 250 MCG PO CAPS: 250.0000 ug | ORAL_CAPSULE | Freq: Two times a day (BID) | ORAL | 3 refills | Status: DC

## 2019-12-28 NOTE — Patient Instructions (Signed)
Medication Instructions:  Increase Norvasc to 5mg  daily *If you need a refill on your cardiac medications before your next appointment, please call your pharmacy*  Lab Work: Your physician recommends that you return for lab work today (BMP, [place])  If you have labs (blood work) drawn today and your tests are completely normal, you will receive your results only by: Marland Kitchen MyChart Message (if you have MyChart) OR . A paper copy in the mail If you have any lab test that is abnormal or we need to change your treatment, we will call you to review the results.  Testing/Procedures: None  Follow-Up: At [facility], you and your health needs are our priority.  As part of our continuing mission to provide you with exceptional heart care, we have created designated Provider Care Teams.  These Care Teams include your primary Cardiologist (physician) and Advanced Practice Providers (APPs -  Physician Assistants and Nurse Practitioners) who all work together to provide you with the care you need, when you need it.  Your next appointment:   6 month(s)  You will receive a reminder letter in the mail two months in advance. If you don't receive a letter, please call our office to schedule the follow-up appointment.   The format for your next appointment:   In Person  Provider:   Minus Breeding, MD

## 2020-01-04 ENCOUNTER — Other Ambulatory Visit: Payer: Self-pay

## 2020-01-04 NOTE — Progress Notes (Signed)
Assessment & Plan:  1-2. Persistent atrial fibrillation (HCC)/Chronic anticoagulation Description   Continue to take warfarin 7.5 mg on Monday, Wednesday, and Friday and 5 mg all other days.   INR 2.7 today. Goal 2.0-3.0  Recheck in 6 weeks   - CoaguChek XS/INR Waived  3. Acute cystitis with hematuria - Patient to return on Monday for re-check of INR due to use of Cipro with warfarin.  - ciprofloxacin (CIPRO) 500 MG tablet; Take 1 tablet (500 mg total) by mouth 2 (two) times daily for 10 days.  Dispense: 20 tablet; Refill: 0  4. Frequent urination - Urinalysis, Complete - Urine dipstick shows positive for RBC's, positive for nitrates and positive for leukocytes.  Micro exam: >30 WBC's per HPF, 11-30 RBC's per HPF and many bacteria.   Follow up plan: Return in about 7 weeks (around 02/22/2020) for INR (need that day due to antibiotics & warfarin use).  Hendricks Limes, MSN, APRN, FNP-C [facility]  Subjective:   Patient ID: Melody Casey, female    DOB: 07-Nov-1946, 74 y.o.   MRN: [id number]  HPI: Melody Casey is a 74 y.o. female presenting on 01/05/2020 for Coagulation Disorder and Urinary Frequency (x 3 weeks)  Anticoagulation: Patient here for anticoagulation monitoring. Indication: atrial fibrillation Bleeding Signs/Symptoms:  None Thromboembolic Signs/Symptoms:  None  Missed Coumadin Doses:  None Medication Changes:  no Dietary Changes:  no Bacterial/Viral Infection:  no  Other Concerns:  yes - possible UTI  Urinary Tract Infection: Patient complains of frequency and cloudy urine She has had symptoms for a few weeks. Patient denies back pain and fever. Patient does have a history of recurrent UTI.  Patient does not have a history of pyelonephritis. Patient tried taking some of her husbands antibiotics a few weeks ago but got no relief.    ROS: Negative unless specifically indicated above in HPI.   Relevant past medical history reviewed and  updated as indicated.   Allergies and medications reviewed and updated.   Current Outpatient Medications:  .  alendronate (FOSAMAX) 70 MG tablet, Take 70 mg by mouth once a week. Take with a full glass of water on an empty stomach., Disp: , Rfl:  .  ALPRAZolam (XANAX) 0.25 MG tablet, Take 1 tablet (0.25 mg total) by mouth 3 (three) times daily as needed for anxiety., Disp: 30 tablet, Rfl: 2 .  amLODipine (NORVASC) 5 MG tablet, Take 0.5 tablets (2.5 mg total) by mouth daily. (Patient taking differently: Take 5 mg by mouth daily. ), Disp: 90 tablet, Rfl: 3 .  atorvastatin (LIPITOR) 40 MG tablet, TAKE 1 AND 1/2 TABLETS BY MOUTH EVERY DAY, Disp: 45 tablet, Rfl: 3 .  dofetilide (TIKOSYN) 250 MCG capsule, Take 1 capsule (250 mcg total) by mouth 2 (two) times daily., Disp: 180 capsule, Rfl: 3 .  glucose blood (TRUETEST TEST) test strip, Check blood sugar daily, Disp: 100 each, Rfl: 3 .  hydroxychloroquine (PLAQUENIL) 200 MG tablet, Take 1 tablet (200 mg total) by mouth 2 (two) times daily., Disp: 60 tablet, Rfl: 0 .  lisinopril (ZESTRIL) 20 MG tablet, Take 1 tablet (20 mg total) by mouth daily., Disp: 90 tablet, Rfl: 1 .  magnesium oxide (MAG-OX) 400 MG tablet, Take 400 mg by mouth 2 (two) times daily., Disp: , Rfl:  .  potassium chloride (K-DUR) 10 MEQ tablet, Take 1 tablet by mouth once daily, Disp: 30 tablet, Rfl: 10 .  traMADol (ULTRAM) 50 MG tablet, Take 1 tablet (50 mg total)  by mouth every 6 (six) hours as needed for moderate pain or severe pain., Disp: 30 tablet, Rfl: 0 .  warfarin (COUMADIN) 5 MG tablet, Take 1.5 tablets (7.5 mg total) by mouth daily., Disp: 135 tablet, Rfl: 0 .  ciprofloxacin (CIPRO) 500 MG tablet, Take 1 tablet (500 mg total) by mouth 2 (two) times daily for 10 days., Disp: 20 tablet, Rfl: 0  Allergies  Allergen Reactions  . Miralax [Polyethylene Glycol] Swelling and Rash    Took [facility] brand developed rash. Patient states she tolerated name brand MiraLax in the  past.  09/01/15. Patient states she had diffuse swelling including swelling of her lips.   . Vancomycin Rash and Shortness Of Breath  . Acetaminophen Hives  . Oxycodone-Acetaminophen Itching  . Sulfa Antibiotics Rash    All over rash  . Sulfacetamide Sodium Rash    All over rash  . Sulfasalazine Rash    All over rash All over rash  . Banana Other (See Comments)    Unknown  . Latex Rash  . Tape Rash    Objective:   BP (!) 145/72   Pulse 69   Temp 97.8 F (36.6 C) (Temporal)   Ht 5\' 7"  (1.702 m)   Wt 194 lb 9.6 oz (88.3 kg)   SpO2 95%   BMI 30.48 kg/m    Physical Exam Vitals reviewed.  Constitutional:      General: She is not in acute distress.    Appearance: Normal appearance. She is obese. She is not ill-appearing, toxic-appearing or diaphoretic.  HENT:     Head: Normocephalic and atraumatic.  Eyes:     General: No scleral icterus.       Right eye: No discharge.        Left eye: No discharge.     Conjunctiva/sclera: Conjunctivae normal.  Cardiovascular:     Rate and Rhythm: Normal rate and regular rhythm.     Heart sounds: Normal heart sounds. No murmur. No friction rub. No gallop.   Pulmonary:     Effort: Pulmonary effort is normal. No respiratory distress.     Breath sounds: Normal breath sounds. No stridor. No wheezing, rhonchi or rales.  Musculoskeletal:        General: Normal range of motion.     Cervical back: Normal range of motion.  Skin:    General: Skin is warm and dry.     Capillary Refill: Capillary refill takes less than 2 seconds.  Neurological:     General: No focal deficit present.     Mental Status: She is alert and oriented to person, place, and time. Mental status is at baseline.  Psychiatric:        Mood and Affect: Mood normal.        Behavior: Behavior normal.        Thought Content: Thought content normal.        Judgment: Judgment normal.

## 2020-01-05 ENCOUNTER — Encounter: Payer: Self-pay | Admitting: Family Medicine

## 2020-01-05 ENCOUNTER — Ambulatory Visit (INDEPENDENT_AMBULATORY_CARE_PROVIDER_SITE_OTHER): Payer: Medicare Other | Admitting: Family Medicine

## 2020-01-05 VITALS — BP 145/72 | HR 69 | Temp 97.8°F | Ht 67.0 in | Wt 194.6 lb

## 2020-01-05 DIAGNOSIS — I4819 Other persistent atrial fibrillation: Secondary | ICD-10-CM | POA: Diagnosis not present

## 2020-01-05 DIAGNOSIS — R350 Frequency of micturition: Secondary | ICD-10-CM

## 2020-01-05 DIAGNOSIS — Z7901 Long term (current) use of anticoagulants: Secondary | ICD-10-CM

## 2020-01-05 DIAGNOSIS — N3001 Acute cystitis with hematuria: Secondary | ICD-10-CM | POA: Diagnosis not present

## 2020-01-05 LAB — URINALYSIS, COMPLETE
Bilirubin, UA: NEGATIVE
Glucose, UA: NEGATIVE
Ketones, UA: NEGATIVE
Nitrite, UA: POSITIVE — AB
Protein,UA: NEGATIVE
Specific Gravity, UA: 1.025 (ref 1.005–1.030)
Urobilinogen, Ur: 0.2 mg/dL (ref 0.2–1.0)
pH, UA: 7 (ref 5.0–7.5)

## 2020-01-05 LAB — MICROSCOPIC EXAMINATION
Epithelial Cells (non renal): >10 /hpf — AB (ref 0–10)
WBC, UA: >30 /hpf — AB (ref 0–5)

## 2020-01-05 LAB — COAGUCHEK XS/INR WAIVED
INR: 2.7 — ABNORMAL HIGH (ref 0.9–1.1)
Prothrombin Time: 32.5 s

## 2020-01-05 MED ORDER — CIPROFLOXACIN HCL 500 MG PO TABS: 500.0000 mg | ORAL_TABLET | Freq: Two times a day (BID) | ORAL | 0 refills | Status: AC

## 2020-01-06 ENCOUNTER — Telehealth: Payer: Self-pay | Admitting: Family Medicine

## 2020-01-06 DIAGNOSIS — I4819 Other persistent atrial fibrillation: Secondary | ICD-10-CM

## 2020-01-06 DIAGNOSIS — Z79899 Other long term (current) drug therapy: Secondary | ICD-10-CM

## 2020-01-06 NOTE — Telephone Encounter (Signed)
Erroneous encounter

## 2020-01-07 ENCOUNTER — Telehealth: Payer: Self-pay | Admitting: Cardiology

## 2020-01-07 MED ORDER — AMLODIPINE BESYLATE 5 MG PO TABS: 5.0000 mg | ORAL_TABLET | Freq: Every day | ORAL | 3 refills | Status: DC

## 2020-01-07 NOTE — Addendum Note (Signed)
Addended by: Annita Brod on: 01/07/2020 03:21 PM   Modules accepted: Orders

## 2020-01-07 NOTE — Telephone Encounter (Signed)
New message  Pt c/o medication issue:  1. Name of Medication: amLODipine (NORVASC) 5 MG tablet  2. How are you currently taking this medication (dosage and times per day)? 5 mg daily (1 tablet)  3. Are you having a reaction (difficulty breathing--STAT)? No  4. What is your medication issue? Patient is calling in to speak with Dr. Percival Spanish or his nurse about how she should be taking medication. Patient states that she was told at her appointment to take 1 tablet daily, but the instructions on her new prescription still say take 2.5 mg (1/2 tablet) daily. Please advise patient.

## 2020-01-07 NOTE — Telephone Encounter (Signed)
Spoke with pt and informed her of the following from Dr. Percival Spanish 1/18 office note: "Hypertension:   Her blood pressures not at target.  I am going to increase her amlodipine to 5 mg daily."   Informed pt that triage nurse can update med list to reflect these instructions and resend to her pharmacy. Pt states she will finish up the 2.5 mg tablets of amlodipine she has, taking two tabs a day for a total of 5 mg daily then she will take the 5 mg tablets she recently picked up from her pharmacy until she runs out. She will then pick up refill with expectation that correct instructions will be on med bottle.  Updated rx for amlodipine 5mg  qd sent to pt pharmacy

## 2020-01-09 LAB — URINE CULTURE

## 2020-01-12 DIAGNOSIS — L84 Corns and callosities: Secondary | ICD-10-CM | POA: Diagnosis not present

## 2020-01-12 DIAGNOSIS — B351 Tinea unguium: Secondary | ICD-10-CM | POA: Diagnosis not present

## 2020-01-12 DIAGNOSIS — M79676 Pain in unspecified toe(s): Secondary | ICD-10-CM | POA: Diagnosis not present

## 2020-01-12 DIAGNOSIS — I70203 Unspecified atherosclerosis of native arteries of extremities, bilateral legs: Secondary | ICD-10-CM | POA: Diagnosis not present

## 2020-02-13 ENCOUNTER — Other Ambulatory Visit: Payer: Self-pay | Admitting: Family Medicine

## 2020-02-17 ENCOUNTER — Other Ambulatory Visit: Payer: Self-pay

## 2020-02-18 ENCOUNTER — Encounter: Payer: Self-pay | Admitting: Family Medicine

## 2020-02-18 ENCOUNTER — Ambulatory Visit (INDEPENDENT_AMBULATORY_CARE_PROVIDER_SITE_OTHER): Payer: Medicare Other | Admitting: Family Medicine

## 2020-02-18 VITALS — BP 101/60 | HR 71 | Temp 98.7°F | Ht 67.0 in | Wt 198.0 lb

## 2020-02-18 DIAGNOSIS — Z7901 Long term (current) use of anticoagulants: Secondary | ICD-10-CM

## 2020-02-18 DIAGNOSIS — I4819 Other persistent atrial fibrillation: Secondary | ICD-10-CM

## 2020-02-18 DIAGNOSIS — I480 Paroxysmal atrial fibrillation: Secondary | ICD-10-CM

## 2020-02-18 DIAGNOSIS — Z79899 Other long term (current) drug therapy: Secondary | ICD-10-CM

## 2020-02-18 LAB — COAGUCHEK XS/INR WAIVED
INR: 2.5 — ABNORMAL HIGH (ref 0.9–1.1)
Prothrombin Time: 30.2 s

## 2020-02-18 MED ORDER — METOPROLOL SUCCINATE ER 25 MG PO TB24: 25.0000 mg | ORAL_TABLET | Freq: Every day | ORAL | 3 refills | Status: DC

## 2020-02-18 NOTE — Progress Notes (Signed)
BP 101/60   Pulse 71   Temp 98.7 F (37.1 C)   Ht 5\' 7"  (1.702 m)   Wt 198 lb (89.8 kg)   SpO2 96%   BMI 31.01 kg/m    Subjective:   Patient ID: Melody Casey, female    DOB: 1946-07-22, 74 y.o.   MRN: [id number]  HPI: Melody Casey is a 74 y.o. female presenting on 02/18/2020 for Medical Management of Chronic Issues, Atrial Fibrillation, and Anxiety   HPI Coumadin recheck Target goal: 2.0-3.0 Reason on anticoagulation: Persistent A. fib Patient denies any bruising or bleeding or chest pain or palpitations   Relevant past medical, surgical, family and social history reviewed and updated as indicated. Interim medical history since our last visit reviewed. Allergies and medications reviewed and updated.  Review of Systems  Constitutional: Negative for chills and fever.  Respiratory: Negative for chest tightness and shortness of breath.   Cardiovascular: Positive for palpitations. Negative for chest pain and leg swelling.  Musculoskeletal: Negative for back pain and gait problem.  Skin: Negative for rash.  Neurological: Negative for light-headedness and headaches.  Psychiatric/Behavioral: Negative for agitation and behavioral problems.  All other systems reviewed and are negative.   Per HPI unless specifically indicated above   Allergies as of 02/18/2020      Reactions   Miralax [polyethylene Glycol] Swelling, Rash   Took [facility] brand developed rash. Patient states she tolerated name brand MiraLax in the past. 09/01/15. Patient states she had diffuse swelling including swelling of her lips.    Vancomycin Rash, Shortness Of Breath   Acetaminophen Hives   Oxycodone-acetaminophen Itching   Sulfa Antibiotics Rash   All over rash   Sulfacetamide Sodium Rash   All over rash   Sulfasalazine Rash   All over rash All over rash   Banana Other (See Comments)   Unknown   Latex Rash   Tape Rash      Medication List       Accurate as of February 18, 2020  2:00 PM. If you have  any questions, ask your nurse or doctor.        alendronate 70 MG tablet Commonly known as: FOSAMAX Take 70 mg by mouth once a week. Take with a full glass of water on an empty stomach.   ALPRAZolam 0.25 MG tablet Commonly known as: XANAX Take 1 tablet (0.25 mg total) by mouth 3 (three) times daily as needed for anxiety.   amLODipine 5 MG tablet Commonly known as: NORVASC Take 1 tablet (5 mg total) by mouth daily.   atorvastatin 40 MG tablet Commonly known as: LIPITOR TAKE 1 AND 1/2 TABLETS BY MOUTH EVERY DAY   dofetilide 250 MCG capsule Commonly known as: TIKOSYN Take 1 capsule (250 mcg total) by mouth 2 (two) times daily.   glucose blood test strip Commonly known as: TRUEtest Test Check blood sugar daily   hydroxychloroquine 200 MG tablet Commonly known as: PLAQUENIL Take 1 tablet (200 mg total) by mouth 2 (two) times daily.   lisinopril 20 MG tablet Commonly known as: ZESTRIL Take 1 tablet (20 mg total) by mouth daily.   magnesium oxide 400 MG tablet Commonly known as: MAG-OX Take 400 mg by mouth 2 (two) times daily.   potassium chloride 10 MEQ tablet Commonly known as: KLOR-CON Take 1 tablet by mouth once daily   traMADol 50 MG tablet Commonly known as: ULTRAM Take 1 tablet (50 mg total) by mouth every 6 (six) hours as needed  for moderate pain or severe pain.   warfarin 5 MG tablet Commonly known as: COUMADIN Take as directed by the anticoagulation clinic. If you are unsure how to take this medication, talk to your nurse or doctor. Original instructions: TAKE 1 AND 1/2 TABLETS(7.5 MG) BY MOUTH DAILY        Objective:   BP 101/60   Pulse 71   Temp 98.7 F (37.1 C)   Ht 5\' 7"  (1.702 m)   Wt 198 lb (89.8 kg)   SpO2 96%   BMI 31.01 kg/m   Wt Readings from Last 3 Encounters:  02/18/20 198 lb (89.8 kg)  01/05/20 194 lb 9.6 oz (88.3 kg)  12/28/19 193 lb 12.8 oz (87.9 kg)    Physical Exam Vitals and nursing note reviewed.  Constitutional:       General: She is not in acute distress.    Appearance: She is well-developed. She is not diaphoretic.  Eyes:     Conjunctiva/sclera: Conjunctivae normal.  Cardiovascular:     Rate and Rhythm: Normal rate. Rhythm irregular.     Heart sounds: Normal heart sounds. No murmur.  Pulmonary:     Effort: Pulmonary effort is normal. No respiratory distress.     Breath sounds: Normal breath sounds. No wheezing.  Musculoskeletal:        General: No tenderness. Normal range of motion.  Skin:    General: Skin is warm and dry.     Findings: No rash.  Neurological:     Mental Status: She is alert and oriented to person, place, and time.     Coordination: Coordination normal.  Psychiatric:        Behavior: Behavior normal.       Assessment & Plan:   Problem List Items Addressed This Visit      Cardiovascular and Mediastinum   Persistent atrial fibrillation (HCC)   Relevant Medications   metoprolol succinate (TOPROL-XL) 25 MG 24 hr tablet   PAF (paroxysmal atrial fibrillation) (HCC)   Relevant Medications   metoprolol succinate (TOPROL-XL) 25 MG 24 hr tablet     Other   High risk medication use   Chronic anticoagulation - Primary   Relevant Medications   metoprolol succinate (TOPROL-XL) 25 MG 24 hr tablet   Other Relevant Orders   CoaguChek XS/INR Waived      Description   Continue to take warfarin 7.5 mg on Monday, Wednesday, and Friday and 5 mg all other days.   INR 2.5 today. Goal 2.0-3.0  Recheck in 6 weeks     Patient gets episodes where her heart rate goes fluttering and also her blood pressure goes up, will give the metoprolol to see if we can calm that down because it will also help with her anxiety. Follow up plan: Return in about 6 weeks (around 03/31/2020), or if symptoms worsen or fail to improve, for INR recheck.  Counseling provided for all of the vaccine components Orders Placed This Encounter  Procedures  . CoaguChek XS/INR [place],  MD [facility] 02/18/2020, 2:00 PM

## 2020-02-19 ENCOUNTER — Ambulatory Visit: Payer: Medicare Other | Attending: Internal Medicine

## 2020-02-19 DIAGNOSIS — Z23 Encounter for immunization: Secondary | ICD-10-CM

## 2020-02-19 NOTE — Progress Notes (Signed)
   Covid-19 Vaccination Clinic  Name:  Melody Casey    MRN: [id number] DOB: [birth year]  02/19/2020  [name] was observed post Covid-19 immunization for 15 minutes without incident. She was provided with Vaccine Information Sheet and instruction to access the V-Safe system.   [name] was instructed to call 911 with any severe reactions post vaccine: Marland Kitchen Difficulty breathing  . Swelling of face and throat  . A fast heartbeat  . A bad rash all over body  . Dizziness and weakness   Immunizations Administered    Name Date Dose VIS Date Route   Moderna COVID-19 Vaccine 02/19/2020 10:59 AM 0.5 mL 11/10/2019 Intramuscular   Manufacturer: Moderna   Lot: [id number]   [place]: [id number]

## 2020-03-07 ENCOUNTER — Other Ambulatory Visit: Payer: Self-pay

## 2020-03-07 MED ORDER — ATORVASTATIN CALCIUM 40 MG PO TABS: ORAL_TABLET | ORAL | 3 refills | Status: DC

## 2020-03-22 DIAGNOSIS — B351 Tinea unguium: Secondary | ICD-10-CM | POA: Diagnosis not present

## 2020-03-22 DIAGNOSIS — I70203 Unspecified atherosclerosis of native arteries of extremities, bilateral legs: Secondary | ICD-10-CM | POA: Diagnosis not present

## 2020-03-22 DIAGNOSIS — L84 Corns and callosities: Secondary | ICD-10-CM | POA: Diagnosis not present

## 2020-03-22 DIAGNOSIS — M79676 Pain in unspecified toe(s): Secondary | ICD-10-CM | POA: Diagnosis not present

## 2020-03-23 ENCOUNTER — Ambulatory Visit: Payer: Medicare Other | Attending: Internal Medicine

## 2020-03-23 DIAGNOSIS — Z23 Encounter for immunization: Secondary | ICD-10-CM

## 2020-03-23 NOTE — Progress Notes (Signed)
   Covid-19 Vaccination Clinic  Name:  Melody Casey    MRN: [id number] DOB: 08-15-1946  03/23/2020  [name] was observed post Covid-19 immunization for 30 minutes based on pre-vaccination screening without incident. She was provided with Vaccine Information Sheet and instruction to access the V-Safe system.   [name] was instructed to call 911 with any severe reactions post vaccine: Marland Kitchen Difficulty breathing  . Swelling of face and throat  . A fast heartbeat  . A bad rash all over body  . Dizziness and weakness   Immunizations Administered    Name Date Dose VIS Date Route   Moderna COVID-19 Vaccine 03/23/2020  9:59 AM 0.5 mL 11/10/2019 Intramuscular   Manufacturer: Moderna   Lot: [id number]   [place]: [id number]

## 2020-04-01 ENCOUNTER — Ambulatory Visit (INDEPENDENT_AMBULATORY_CARE_PROVIDER_SITE_OTHER): Payer: Medicare Other | Admitting: Family Medicine

## 2020-04-01 ENCOUNTER — Encounter: Payer: Self-pay | Admitting: Family Medicine

## 2020-04-01 ENCOUNTER — Other Ambulatory Visit: Payer: Self-pay

## 2020-04-01 VITALS — BP 129/65 | HR 59 | Temp 97.5°F | Ht 67.0 in | Wt 195.1 lb

## 2020-04-01 DIAGNOSIS — Z79899 Other long term (current) drug therapy: Secondary | ICD-10-CM

## 2020-04-01 DIAGNOSIS — I4891 Unspecified atrial fibrillation: Secondary | ICD-10-CM

## 2020-04-01 DIAGNOSIS — I4819 Other persistent atrial fibrillation: Secondary | ICD-10-CM | POA: Diagnosis not present

## 2020-04-01 LAB — COAGUCHEK XS/INR WAIVED
INR: 2 — ABNORMAL HIGH (ref 0.9–1.1)
Prothrombin Time: 24.3 s

## 2020-04-01 NOTE — Progress Notes (Signed)
BP 129/65   Pulse (!) 59   Temp (!) 97.5 F (36.4 C) (Temporal)   Ht 5\' 7"  (1.702 m)   Wt 195 lb 2 oz (88.5 kg)   BMI 30.56 kg/m    Subjective:   Patient ID: Melody Casey, female    DOB: Oct 19, 1946, 74 y.o.   MRN: [id number]  HPI: Melody Casey is a 74 y.o. female presenting on 04/01/2020 for Medical Management of Chronic Issues   HPI Coumadin recheck Target goal: 2.0-3.0 Reason on anticoagulation: Paroxysmal A. fib Patient denies any bruising or bleeding or chest pain or palpitations   Relevant past medical, surgical, family and social history reviewed and updated as indicated. Interim medical history since our last visit reviewed. Allergies and medications reviewed and updated.  Review of Systems  Constitutional: Negative for chills and fever.  Eyes: Negative for visual disturbance.  Respiratory: Negative for chest tightness and shortness of breath.   Cardiovascular: Negative for chest pain and leg swelling.  Gastrointestinal: Negative for anal bleeding.  Genitourinary: Negative for hematuria.  Musculoskeletal: Negative for back pain and gait problem.  Skin: Negative for rash.  Neurological: Negative for light-headedness and headaches.  Psychiatric/Behavioral: Negative for agitation and behavioral problems.  All other systems reviewed and are negative.   Per HPI unless specifically indicated above   Allergies as of 04/01/2020      Reactions   Miralax [polyethylene Glycol] Swelling, Rash   Took [facility] brand developed rash. Patient states she tolerated name brand MiraLax in the past. 09/01/15. Patient states she had diffuse swelling including swelling of her lips.    Vancomycin Rash, Shortness Of Breath   Acetaminophen Hives   Oxycodone-acetaminophen Itching   Sulfa Antibiotics Rash   All over rash   Sulfacetamide Sodium Rash   All over rash   Sulfasalazine Rash   All over rash All over rash   Banana Other (See Comments)   Unknown   Latex Rash    Tape Rash      Medication List       Accurate as of April 01, 2020  1:12 PM. If you have any questions, ask your nurse or doctor.        alendronate 70 MG tablet Commonly known as: FOSAMAX Take 70 mg by mouth once a week. Take with a full glass of water on an empty stomach.   ALPRAZolam 0.25 MG tablet Commonly known as: XANAX Take 1 tablet (0.25 mg total) by mouth 3 (three) times daily as needed for anxiety.   amLODipine 5 MG tablet Commonly known as: NORVASC Take 1 tablet (5 mg total) by mouth daily.   atorvastatin 40 MG tablet Commonly known as: LIPITOR TAKE 1 AND 1/2 TABLETS BY MOUTH EVERY DAY   dofetilide 250 MCG capsule Commonly known as: TIKOSYN Take 1 capsule (250 mcg total) by mouth 2 (two) times daily.   glucose blood test strip Commonly known as: TRUEtest Test Check blood sugar daily   hydroxychloroquine 200 MG tablet Commonly known as: PLAQUENIL Take 1 tablet (200 mg total) by mouth 2 (two) times daily.   lisinopril 20 MG tablet Commonly known as: ZESTRIL Take 1 tablet (20 mg total) by mouth daily.   magnesium oxide 400 MG tablet Commonly known as: MAG-OX Take 400 mg by mouth 2 (two) times daily.   metoprolol succinate 25 MG 24 hr tablet Commonly known as: TOPROL-XL Take 1 tablet (25 mg total) by mouth daily.   potassium chloride 10 MEQ tablet Commonly known  as: KLOR-CON Take 1 tablet by mouth once daily   traMADol 50 MG tablet Commonly known as: ULTRAM Take 1 tablet (50 mg total) by mouth every 6 (six) hours as needed for moderate pain or severe pain.   warfarin 5 MG tablet Commonly known as: COUMADIN Take as directed by the anticoagulation clinic. If you are unsure how to take this medication, talk to your nurse or doctor. Original instructions: TAKE 1 AND 1/2 TABLETS(7.5 MG) BY MOUTH DAILY        Objective:   BP 129/65   Pulse (!) 59   Temp (!) 97.5 F (36.4 C) (Temporal)   Ht 5\' 7"  (1.702 m)   Wt 195 lb 2 oz (88.5 kg)   BMI  30.56 kg/m   Wt Readings from Last 3 Encounters:  04/01/20 195 lb 2 oz (88.5 kg)  02/18/20 198 lb (89.8 kg)  01/05/20 194 lb 9.6 oz (88.3 kg)    Physical Exam Vitals and nursing note reviewed.  Constitutional:      General: She is not in acute distress.    Appearance: She is well-developed. She is not diaphoretic.  Eyes:     Conjunctiva/sclera: Conjunctivae normal.  Neurological:     Mental Status: She is alert and oriented to person, place, and time.     Coordination: Coordination normal.  Psychiatric:        Behavior: Behavior normal.     Description   Continue to take warfarin 7.5 mg on Monday, Wednesday, and Friday and 5 mg all other days.   INR 2.0 today. Goal 2.0-3.0  Recheck in 6 weeks      Assessment & Plan:   Problem List Items Addressed This Visit      Cardiovascular and Mediastinum   Persistent atrial fibrillation (HCC)   Atrial fibrillation ([place]) - Primary   Relevant Orders   CoaguChek XS/INR Waived     Other   High risk medication use      Continue current medication, no changes, will recheck next time full diabetes and full blood panel. Follow up plan: Return if symptoms worsen or fail to improve, for 6 to 8-week recheck for INR and diabetes and full blood panel..  Counseling provided for all of the vaccine components Orders Placed This Encounter  Procedures  . CoaguChek XS/INR [place], MD [facility] 04/01/2020, 1:12 PM

## 2020-04-07 DIAGNOSIS — M1712 Unilateral primary osteoarthritis, left knee: Secondary | ICD-10-CM | POA: Diagnosis not present

## 2020-05-14 ENCOUNTER — Other Ambulatory Visit: Payer: Self-pay | Admitting: Family Medicine

## 2020-05-14 DIAGNOSIS — F411 Generalized anxiety disorder: Secondary | ICD-10-CM

## 2020-05-17 ENCOUNTER — Telehealth: Payer: Self-pay | Admitting: Adult Health

## 2020-05-17 NOTE — Telephone Encounter (Signed)
Rescheduled appts per provider PAL. Left voicemail with appt cancellation details and new appt date and time, also left my direct number for pt to call back if she has any questions or needs to reschedule.

## 2020-05-18 ENCOUNTER — Other Ambulatory Visit: Payer: Self-pay | Admitting: Family Medicine

## 2020-05-19 DIAGNOSIS — H90A31 Mixed conductive and sensorineural hearing loss, unilateral, right ear with restricted hearing on the contralateral side: Secondary | ICD-10-CM | POA: Diagnosis not present

## 2020-05-19 DIAGNOSIS — H90A22 Sensorineural hearing loss, unilateral, left ear, with restricted hearing on the contralateral side: Secondary | ICD-10-CM | POA: Diagnosis not present

## 2020-05-19 DIAGNOSIS — H905 Unspecified sensorineural hearing loss: Secondary | ICD-10-CM | POA: Diagnosis not present

## 2020-05-21 ENCOUNTER — Other Ambulatory Visit: Payer: Self-pay | Admitting: Family Medicine

## 2020-05-23 ENCOUNTER — Encounter: Payer: Self-pay | Admitting: Family Medicine

## 2020-05-23 ENCOUNTER — Other Ambulatory Visit: Payer: Self-pay

## 2020-05-23 ENCOUNTER — Ambulatory Visit (INDEPENDENT_AMBULATORY_CARE_PROVIDER_SITE_OTHER): Payer: Medicare Other | Admitting: Family Medicine

## 2020-05-23 VITALS — BP 121/64 | HR 70 | Temp 97.3°F | Ht 67.0 in | Wt 196.0 lb

## 2020-05-23 DIAGNOSIS — R531 Weakness: Secondary | ICD-10-CM

## 2020-05-23 DIAGNOSIS — I10 Essential (primary) hypertension: Secondary | ICD-10-CM | POA: Diagnosis not present

## 2020-05-23 DIAGNOSIS — Z79899 Other long term (current) drug therapy: Secondary | ICD-10-CM | POA: Diagnosis not present

## 2020-05-23 DIAGNOSIS — E1169 Type 2 diabetes mellitus with other specified complication: Secondary | ICD-10-CM | POA: Diagnosis not present

## 2020-05-23 DIAGNOSIS — E118 Type 2 diabetes mellitus with unspecified complications: Secondary | ICD-10-CM

## 2020-05-23 DIAGNOSIS — I4819 Other persistent atrial fibrillation: Secondary | ICD-10-CM | POA: Diagnosis not present

## 2020-05-23 DIAGNOSIS — R2689 Other abnormalities of gait and mobility: Secondary | ICD-10-CM | POA: Diagnosis not present

## 2020-05-23 DIAGNOSIS — I4891 Unspecified atrial fibrillation: Secondary | ICD-10-CM

## 2020-05-23 DIAGNOSIS — E785 Hyperlipidemia, unspecified: Secondary | ICD-10-CM

## 2020-05-23 LAB — BAYER DCA HB A1C WAIVED: HB A1C (BAYER DCA - WAIVED): 7 % — ABNORMAL HIGH (ref <7.0)

## 2020-05-23 LAB — COAGUCHEK XS/INR WAIVED
INR: 2.9 — ABNORMAL HIGH (ref 0.9–1.1)
Prothrombin Time: 34.5 s

## 2020-05-23 NOTE — Progress Notes (Signed)
BP 121/64   Pulse 70   Temp (!) 97.3 F (36.3 C)   Ht 5' 7"  (1.702 m)   Wt 196 lb (88.9 kg)   SpO2 96%   BMI 30.70 kg/m    Subjective:   Patient ID: Melody Casey, female    DOB: May 22, 1946, 74 y.o.   MRN: [id number]  HPI: Melody Casey is a 74 y.o. female presenting on 05/23/2020 for No chief complaint on file.   HPI Coumadin recheck Target goal: 2.0-3.0 Reason on anticoagulation: Chronic A. fib Patient denies any bruising or bleeding or chest pain or palpitations   Type 2 diabetes mellitus Patient comes in today for recheck of his diabetes. Patient has been currently taking no medication currently, A1c today 6.0. Patient is currently on an ACE inhibitor/ARB. Patient has not seen an ophthalmologist this year. Patient denies any issues with their feet. The symptom started onset as an adult hypertension and cholesterol and A. fib ARE RELATED TO DM   Hypertension Patient is currently on lisinopril and amlodipine metoprolol, and their blood pressure today is 121/64. Patient denies any lightheadedness or dizziness. Patient denies headaches, blurred vision, chest pains, shortness of breath, or weakness. Denies any side effects from medication and is content with current medication.   Hyperlipidemia Patient is coming in for recheck of his hyperlipidemia. The patient is currently taking atorvastatin. They deny any issues with myalgias or history of liver damage from it. They deny any focal numbness or weakness or chest pain.   Over this past year patient has been having increasing weakness and numbness feels more unsteady and feels like her balance is getting worse and she thinks a lot of it is due to Covid but she also has a left knee osteoarthritis that has been limiting her ability to move as well and exercise and she has been affected a lot by that.  Relevant past medical, surgical, family and social history reviewed and updated as indicated. Interim medical history  since our last visit reviewed. Allergies and medications reviewed and updated.  Review of Systems  Constitutional: Negative for chills and fever.  Eyes: Negative for visual disturbance.  Respiratory: Negative for chest tightness and shortness of breath.   Cardiovascular: Negative for chest pain and leg swelling.  Musculoskeletal: Positive for gait problem. Negative for back pain.  Skin: Negative for rash.  Neurological: Positive for weakness. Negative for light-headedness, numbness and headaches.  Psychiatric/Behavioral: Negative for agitation and behavioral problems.  All other systems reviewed and are negative.   Per HPI unless specifically indicated above      Objective:   BP 121/64   Pulse 70   Temp (!) 97.3 F (36.3 C)   Ht 5' 7"  (1.702 m)   Wt 196 lb (88.9 kg)   SpO2 96%   BMI 30.70 kg/m   Wt Readings from Last 3 Encounters:  05/23/20 196 lb (88.9 kg)  04/01/20 195 lb 2 oz (88.5 kg)  02/18/20 198 lb (89.8 kg)    Physical Exam Vitals and nursing note reviewed.  Constitutional:      General: She is not in acute distress.    Appearance: She is well-developed. She is not diaphoretic.  Eyes:     Conjunctiva/sclera: Conjunctivae normal.     Pupils: Pupils are equal, round, and reactive to light.  Cardiovascular:     Rate and Rhythm: Normal rate and regular rhythm.     Heart sounds: Normal heart sounds. No murmur heard.   Pulmonary:  Effort: Pulmonary effort is normal. No respiratory distress.     Breath sounds: Normal breath sounds. No wheezing.  Musculoskeletal:        General: No tenderness. Normal range of motion.  Skin:    General: Skin is warm and dry.     Findings: No rash.  Neurological:     Mental Status: She is alert and oriented to person, place, and time.     Coordination: Coordination normal.  Psychiatric:        Behavior: Behavior normal.       Assessment & Plan:   Problem List Items Addressed This Visit      Cardiovascular and  Mediastinum   Essential hypertension   Persistent atrial fibrillation (HCC)   RESOLVED: Atrial fibrillation (HCC) - Primary   Relevant Orders   CoaguChek XS/INR Waived   CBC with Differential/Platelet   CMP14+EGFR     Endocrine   Type 2 diabetes mellitus with complication, without long-term current use of insulin (HCC)     Other   Dyslipidemia, goal LDL below 70   Relevant Orders   Lipid panel   High risk medication use    Other Visit Diagnoses    Type 2 diabetes mellitus with other specified complication, without long-term current use of insulin (HCC)       Relevant Orders   Bayer DCA Hb A1c Waived   CMP14+EGFR   TSH   Balance disorder       Relevant Orders   Ambulatory referral to Physical Therapy   Weakness       Relevant Orders   Ambulatory referral to Physical Therapy      Description   Continue to take warfarin 7.5 mg on Monday, Wednesday, and Friday and 5 mg all other days.   INR 2.9 today. Goal 2.0-3.0  Recheck in 6 weeks    A1c looks good, continue with diet and lifestyle modification. Follow up plan: Return if symptoms worsen or fail to improve, for 6 to 8 weeks INR.  Counseling provided for all of the vaccine components Orders Placed This Encounter  Procedures  . Bayer DCA Hb A1c Waived  . CoaguChek XS/INR Waived  . CBC with Differential/Platelet  . CMP14+EGFR  . Lipid panel  . TSH  . Ambulatory referral to Physical Therapy    [name], MD [facility] 05/23/2020, 11:00 AM

## 2020-05-24 LAB — CBC WITH DIFFERENTIAL/PLATELET
Basophils Absolute: 0 10*3/uL (ref 0.0–0.2)
Basos: 1 %
EOS (ABSOLUTE): 0.1 10*3/uL (ref 0.0–0.4)
Eos: 3 %
Hematocrit: 38.2 % (ref 34.0–46.6)
Hemoglobin: 12.4 g/dL (ref 11.1–15.9)
Immature Grans (Abs): 0 10*3/uL (ref 0.0–0.1)
Immature Granulocytes: 0 %
Lymphocytes Absolute: 1.1 10*3/uL (ref 0.7–3.1)
Lymphs: 25 %
MCH: 29 pg (ref 26.6–33.0)
MCHC: 32.5 g/dL (ref 31.5–35.7)
MCV: 89 fL (ref 79–97)
Monocytes Absolute: 0.4 10*3/uL (ref 0.1–0.9)
Monocytes: 8 %
Neutrophils Absolute: 2.8 10*3/uL (ref 1.4–7.0)
Neutrophils: 63 %
Platelets: 175 10*3/uL (ref 150–450)
RBC: 4.28 x10E6/uL (ref 3.77–5.28)
RDW: 14.1 % (ref 11.7–15.4)
WBC: 4.4 10*3/uL (ref 3.4–10.8)

## 2020-05-24 LAB — CMP14+EGFR
ALT: 14 IU/L (ref 0–32)
AST: 21 IU/L (ref 0–40)
Albumin/Globulin Ratio: 1.4 (ref 1.2–2.2)
Albumin: 3.9 g/dL (ref 3.7–4.7)
Alkaline Phosphatase: 107 IU/L (ref 48–121)
BUN/Creatinine Ratio: 19 (ref 12–28)
BUN: 13 mg/dL (ref 8–27)
Bilirubin Total: 0.4 mg/dL (ref 0.0–1.2)
CO2: 19 mmol/L — ABNORMAL LOW (ref 20–29)
Calcium: 10.2 mg/dL (ref 8.7–10.3)
Chloride: 107 mmol/L — ABNORMAL HIGH (ref 96–106)
Creatinine, Ser: 0.68 mg/dL (ref 0.57–1.00)
GFR calc Af Amer: 98 mL/min/{1.73_m2} (ref >59)
GFR calc non Af Amer: 85 mL/min/{1.73_m2} (ref >59)
Globulin, Total: 2.8 g/dL (ref 1.5–4.5)
Glucose: 117 mg/dL — ABNORMAL HIGH (ref 65–99)
Potassium: 4.2 mmol/L (ref 3.5–5.2)
Sodium: 139 mmol/L (ref 134–144)
Total Protein: 6.7 g/dL (ref 6.0–8.5)

## 2020-05-24 LAB — LIPID PANEL
Chol/HDL Ratio: 2.9 ratio (ref 0.0–4.4)
Cholesterol, Total: 127 mg/dL (ref 100–199)
HDL: 44 mg/dL (ref >39)
LDL Chol Calc (NIH): 66 mg/dL (ref 0–99)
Triglycerides: 90 mg/dL (ref 0–149)
VLDL Cholesterol Cal: 17 mg/dL (ref 5–40)

## 2020-05-24 LAB — TSH: TSH: 2.79 u[IU]/mL (ref 0.450–4.500)

## 2020-05-31 ENCOUNTER — Encounter: Payer: Self-pay | Admitting: Physical Therapy

## 2020-05-31 ENCOUNTER — Ambulatory Visit: Payer: Medicare Other | Attending: Family Medicine | Admitting: Physical Therapy

## 2020-05-31 ENCOUNTER — Other Ambulatory Visit: Payer: Self-pay

## 2020-05-31 DIAGNOSIS — R262 Difficulty in walking, not elsewhere classified: Secondary | ICD-10-CM | POA: Diagnosis present

## 2020-05-31 DIAGNOSIS — G8929 Other chronic pain: Secondary | ICD-10-CM | POA: Diagnosis present

## 2020-05-31 DIAGNOSIS — M25562 Pain in left knee: Secondary | ICD-10-CM

## 2020-05-31 DIAGNOSIS — R2681 Unsteadiness on feet: Secondary | ICD-10-CM | POA: Diagnosis present

## 2020-05-31 DIAGNOSIS — M6281 Muscle weakness (generalized): Secondary | ICD-10-CM | POA: Diagnosis not present

## 2020-05-31 NOTE — Therapy (Signed)
[facility] [place], [place], [address] Phone: [phone]   Fax:  [phone]  Physical Therapy Evaluation  Patient Details  Name: Melody Casey MRN: [id number] Date of Birth: 08-15-1946 Referring Provider (PT): [name], MD   Encounter Date: 05/31/2020   PT End of Session - 05/31/20 2025    Visit Number 1    Number of Visits 12    Date for PT Re-Evaluation 07/19/20    Authorization Type Progress note every 10th visit; KX modifier at 15th visit    PT Start Time 1115    PT Stop Time 1203    PT Time Calculation (min) 48 min    Activity Tolerance Patient tolerated treatment well    Behavior During Therapy [facility] for tasks assessed/performed           Past Medical History:  Diagnosis Date  . AAA (abdominal aortic aneurysm) ([place])   . Acute diverticulitis   . Anxiety   . Arthritis   . Atrial fibrillation ([place])    a. Dx 03/2011 - on tikosyn/coumadin.  [place] [facility] CAD (coronary artery disease)    a. NSTEMI 02/2011: occ mid Cx, DES to OM2, residual nonobst LAD dz.  . Cancer ([place])    breast  . Diabetes mellitus   . Diverticulitis   . Diverticulitis of colon     2008, 04/2011, 12/2014, 08/2015  . 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 Hemangioma    liver  . HLD (hyperlipidemia)   . HTN (hypertension)   . Osteoporosis   . Tobacco abuse    stopped smoking 2012  . Ureteral obstruction    History of gross hematuria/right hydronephrosis 2/2 to uteropelvic junction obstruction, s/p cystoscopy in January 2007 with bilateral retrograde pyelography, right ureter arthroscopy, right ureteral stent placement, bladder biopsies, stent removal since then.  . Wears dentures    top    Past Surgical History:  Procedure Laterality Date  . BIOPSY N/A 11/01/2015   Procedure: BIOPSY;  Surgeon: Danie Binder, MD;  Location: [facility];  Service: Endoscopy;  Laterality: N/A;  . BREAST LUMPECTOMY WITH NEEDLE LOCALIZATION AND  AXILLARY SENTINEL LYMPH NODE BX Right 03/01/2014   Procedure: BREAST LUMPECTOMY WITH NEEDLE LOCALIZATION AND AXILLARY SENTINEL LYMPH NODE BX;  Surgeon: Edward Jolly, MD;  Location: [facility];  Service: General;  Laterality: Right;  . BREAST SURGERY    . CARDIAC CATHETERIZATION  2012   stent  . COLON RESECTION N/A 07/25/2018   Procedure: COLOSTOMY;  Surgeon: Rolm Bookbinder, MD;  Location: [facility];  Service: General;  Laterality: N/A;  . COLONOSCOPY WITH PROPOFOL N/A 06/29/2014   Dr. Wynetta Emery: universal diverticulosis  . CORONARY STENT PLACEMENT  2012  . ESOPHAGOGASTRODUODENOSCOPY (EGD) WITH PROPOFOL N/A 11/01/2015   Procedure: ESOPHAGOGASTRODUODENOSCOPY (EGD) WITH PROPOFOL;  Surgeon: Danie Binder, MD;  Location: [facility];  Service: Endoscopy;  Laterality: N/A;  . KNEE ARTHROSCOPY     left  . LAPAROTOMY N/A 07/25/2018   Procedure: EXPLORATORY LAPAROTOMY;  Surgeon: Rolm Bookbinder, MD;  Location: [facility];  Service: General;  Laterality: N/A;  . PARTIAL COLECTOMY N/A 07/25/2018   Procedure: COLECTOMY;  Surgeon: Rolm Bookbinder, MD;  Location: [facility];  Service: General;  Laterality: N/A;  . Right leg surgery  age 67   As a child for  ?scleroderma per patient  . TONSILLECTOMY AND ADENOIDECTOMY    . TUBAL LIGATION    . Ureteral surgery  2011   rt ureterostomy-stent  There were no vitals filed for this visit.    Subjective Assessment - 05/31/20 2014    Subjective COVID-19 screening performed upon arrival.Patient arrives to physical therapy with reports of decreased balance, weakness, increased L knee pain, and difficulty walking that has progressed since January 2021. Patient reports due to COVID-19, she has been inactive and stopped working. Patient has difficulties with household chores due to fatigue and left knee pain. Patient utilizes a cane for safe ambulation. Patient states she had one fall in which she was able to get to standing independently. Patient reports R  knee pain and weakness limits walking, stair negotiation, and ADLs. Patient's goals are to improve balance, improve strength, improve movement, and return to performing ADLs and household activities with greater ease.    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 Yes    Pain Score 6     Pain Location Knee    Pain Orientation Left    Pain Descriptors / Indicators Sore    Pain Type Chronic pain    Pain Onset More than a month ago    Pain Frequency Constant    Pain Relieving Factors ibuprofen    Effect of Pain on Daily Activities pain with household chores              [facility] PT Assessment - 05/31/20 0001      Assessment   Medical Diagnosis Balance Disorder, Weakness    Referring Provider (PT) [name], MD    Onset Date/Surgical Date --   January 2021   Prior Therapy no      Precautions   Precautions Fall      Restrictions   Weight Bearing Restrictions No      Balance Screen   Has the patient fallen in the past 6 months Yes    How many times? 1    Has the patient had a decrease in activity level because of a fear of falling?  Yes    Is the patient reluctant to leave their home because of a fear of falling?  No      Home Ecologist residence      Prior Function   Level of Independence Independent with basic ADLs      ROM / Strength   AROM / PROM / Strength AROM;PROM;Strength      AROM   Overall AROM  Deficits    AROM Assessment Site Knee    Right/Left Knee Left    Left Knee Extension 15    Left Knee Flexion 95      PROM   Overall PROM  Deficits    PROM Assessment Site Knee    Right/Left Knee Left    Left Knee Extension 13    Left Knee Flexion 100      Strength   Overall  Strength Deficits    Strength Assessment Site Knee;Hip    Right/Left Hip Right;Left    Right Hip Flexion 3/5    Right Hip ABduction 3/5    Left Hip Flexion 3/5    Left Hip ABduction 3/5    Right/Left Knee Right;Left    Right Knee Flexion 4-/5    Right Knee Extension 4-/5    Left Knee Flexion 4-/5  Left Knee Extension 4-/5      Palpation   Palpation comment pain at left knee joint line upon palpation      Ambulation/Gait   Assistive device Straight cane    Gait Pattern Step-through pattern;Decreased step length - right;Decreased step length - left;Decreased stride length;Decreased hip/knee flexion - right;Decreased dorsiflexion - left;Decreased weight shift to right;Left flexed knee in stance;Antalgic;Lateral trunk lean to left;Trunk flexed      Standardized Balance Assessment   Standardized Balance Assessment Berg Balance Test      Berg Balance Test   Sit to Stand Able to stand  independently using hands    Standing Unsupported Able to stand safely 2 minutes    Sitting with Back Unsupported but Feet Supported on Floor or Stool Able to sit safely and securely 2 minutes    Stand to Sit Controls descent by using hands    Transfers Able to transfer safely, definite need of hands    Standing Unsupported with Eyes Closed Able to stand 10 seconds with supervision    Standing Unsupported with Feet Together Able to place feet together independently and stand for 1 minute with supervision    From Standing, Reach Forward with Outstretched Arm Can reach forward >12 cm safely (5")    Berg comment: complete Berg next visit                      Objective measurements completed on examination: See above findings.               PT Education - 05/31/20 2023    Education Details Marching supine and seated, HR/TR, quad sets, hamstring sets    Person(s) Educated Patient    Methods Explanation;Demonstration;Handout    Comprehension Verbalized understanding;Returned  demonstration               PT Long Term Goals - 05/31/20 2026      PT LONG TERM GOAL #1   Title Patient will be independent with HEP and its progression.    Time 6    Period Weeks    Status New      PT LONG TERM GOAL #2   Title Patient will demonstrate 4/5 or greater bilateral LE MMT to improve stability during functional tasks.    Time 6    Period Weeks    Status New      PT LONG TERM GOAL #3   Title Patient will decrease risk of falls as noted by a 6+ point improvement on Berg Balance Scale    Time 6    Period Weeks    Status New      PT LONG TERM GOAL #4   Title Patient will improve LE strength as noted by the ability to perform modified 5x sit sit to stand in 20 seconds or less.    Time 6    Period Weeks    Status New                  Plan - 05/31/20 2051    Clinical Impression Statement Patient is a 74 year old female who presents to physical therapy with decreased bilateral LE MMT, decreased balance, decreased L knee ROM, and abnormal gait that progressively worsened since January 2021. Patient's modified 5x sit to stand with use of UE time of 22.01 seconds categorizes her as a fall risk with decreased functional LE strength. Patient noted with significant postural deficits (leg length discrepancy and scoliosis) that affects  her gait and balance. Patient and PT discussed POC as well as HEP to maximize PT benefit. Patient reported understanding. Patient would benefit from skilled physical therapy to address deficits and address patient's goals.    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    Examination-Activity Limitations Locomotion Level;Transfers;Stairs;Stand    Stability/Clinical Decision Making Stable/Uncomplicated    Clinical Decision Making Moderate    Rehab Potential Good    PT Frequency 2x / week    PT  Duration 6 weeks    PT Treatment/Interventions ADLs/Self Care Home Management;Cryotherapy;Electrical Stimulation;Moist Heat;Ultrasound;Neuromuscular re-education;Balance training;Therapeutic exercise;Therapeutic activities;Functional mobility training;Stair training;Gait training;Patient/family education;Passive range of motion;Manual techniques;Vasopneumatic Device;Taping    PT Next Visit Plan Complete Berg Balance Scale; LE strengthening, stretching, balance activities in various positions, modalities for L knee PRN for pain relief.    PT Home Exercise Plan see patient education section    Consulted and Agree with Plan of Care Patient           Patient will benefit from skilled therapeutic intervention in order to improve the following deficits and impairments:  Abnormal gait, Difficulty walking, Decreased range of motion, Decreased strength, Decreased activity tolerance, Decreased balance, Pain, Postural dysfunction  Visit Diagnosis: Muscle weakness (generalized) - Plan: PT plan of care cert/re-cert  Chronic pain of left knee - Plan: PT plan of care cert/re-cert  Difficulty in walking, not elsewhere classified - Plan: PT plan of care cert/re-cert  Unsteadiness on feet - Plan: PT plan of care cert/re-cert     Problem List Patient Active Problem List   Diagnosis Date Noted  . Hypokalemia   . Edema of left lower extremity   . Debility 08/06/2018  . Hyponatremia 07/24/2018  . Osteoporosis 08/27/2017  . Low vitamin D level 06/04/2017  . Frequent UTI 10/25/2015  . Microcytic anemia 08/30/2015  . Diverticulosis 08/30/2015  . Hemangioma   . History of TIA (transient ischemic attack) 08/13/2015  . Rheumatoid arthritis ([place]) 06/20/2015  . AAA (abdominal aortic aneurysm) without rupture ([place]) 01/28/2015  . Persistent atrial fibrillation ([place]) 11/17/2014  . History of scleroderma 02/17/2014  . Malignant neoplasm of upper-outer quadrant of right breast in female, estrogen receptor  positive ([place]) 02/01/2014  . Anxiety and depression 12/07/2011  . High risk medication use 06/14/2011  . CAD S/P percutaneous coronary angioplasty   . Type 2 diabetes mellitus with complication, without long-term current use of insulin ([place]) 06/07/2008  . Dyslipidemia, goal LDL below 70 07/22/2007  . Essential hypertension 07/22/2007    Gabriela Eves, PT, DPT 05/31/2020, 9:06 PM  [facility] [address] [place], [place], [address] Phone: [phone]   Fax:  [phone]  Name: Melody Casey MRN: [id number] Date of Birth: 08/21/1946

## 2020-06-02 ENCOUNTER — Other Ambulatory Visit: Payer: Self-pay

## 2020-06-02 ENCOUNTER — Ambulatory Visit: Payer: Medicare Other | Admitting: Physical Therapy

## 2020-06-02 DIAGNOSIS — M6281 Muscle weakness (generalized): Secondary | ICD-10-CM

## 2020-06-02 DIAGNOSIS — R2681 Unsteadiness on feet: Secondary | ICD-10-CM

## 2020-06-02 DIAGNOSIS — G8929 Other chronic pain: Secondary | ICD-10-CM

## 2020-06-02 DIAGNOSIS — R262 Difficulty in walking, not elsewhere classified: Secondary | ICD-10-CM

## 2020-06-02 NOTE — Therapy (Signed)
[facility] [place], [place], [address] Phone: [phone]   Fax:  [phone]  Physical Therapy Treatment  Patient Details  Name: Melody Casey MRN: [id number] Date of Birth: 11-24-1946 Referring Provider (PT): [name], MD   Encounter Date: 06/02/2020   PT End of Session - 06/02/20 5093    Visit Number 2    Number of Visits 12    Date for PT Re-Evaluation 07/19/20    Authorization Type Progress note every 10th visit; KX modifier at 15th visit    PT Start Time 1517    PT Stop Time 1600    PT Time Calculation (min) 43 min    Activity Tolerance Patient tolerated treatment well    Behavior During Therapy [facility] for tasks assessed/performed           Past Medical History:  Diagnosis Date  . AAA (abdominal aortic aneurysm) ([place])   . Acute diverticulitis   . Anxiety   . Arthritis   . Atrial fibrillation ([place])    a. Dx 03/2011 - on tikosyn/coumadin.  Marland Kitchen CAD (coronary artery disease)    a. NSTEMI 02/2011: occ mid Cx, DES to OM2, residual nonobst LAD dz.  . Cancer ([place])    breast  . Diabetes mellitus   . Diverticulitis   . Diverticulitis of colon     2008, 04/2011, 12/2014, 08/2015  . 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 Hemangioma    liver  . HLD (hyperlipidemia)   . HTN (hypertension)   . Osteoporosis   . Tobacco abuse    stopped smoking 2012  . Ureteral obstruction    History of gross hematuria/right hydronephrosis 2/2 to uteropelvic junction obstruction, s/p cystoscopy in January 2007 with bilateral retrograde pyelography, right ureter arthroscopy, right ureteral stent placement, bladder biopsies, stent removal since then.  . Wears dentures    top    Past Surgical History:  Procedure Laterality Date  . BIOPSY N/A 11/01/2015   Procedure: BIOPSY;  Surgeon: Danie Binder, MD;  Location: [facility];  Service: Endoscopy;  Laterality: N/A;  . BREAST LUMPECTOMY WITH NEEDLE LOCALIZATION AND  AXILLARY SENTINEL LYMPH NODE BX Right 03/01/2014   Procedure: BREAST LUMPECTOMY WITH NEEDLE LOCALIZATION AND AXILLARY SENTINEL LYMPH NODE BX;  Surgeon: Edward Jolly, MD;  Location: [place];  Service: General;  Laterality: Right;  . BREAST SURGERY    . CARDIAC CATHETERIZATION  2012   stent  . COLON RESECTION N/A 07/25/2018   Procedure: COLOSTOMY;  Surgeon: Rolm Bookbinder, MD;  Location: [place];  Service: General;  Laterality: N/A;  . COLONOSCOPY WITH PROPOFOL N/A 06/29/2014   Dr. Wynetta Emery: universal diverticulosis  . CORONARY STENT PLACEMENT  2012  . ESOPHAGOGASTRODUODENOSCOPY (EGD) WITH PROPOFOL N/A 11/01/2015   Procedure: ESOPHAGOGASTRODUODENOSCOPY (EGD) WITH PROPOFOL;  Surgeon: Danie Binder, MD;  Location[facility];  Service: Endoscopy;  Laterality: N/A;  . KNEE ARTHROSCOPY     left  . LAPAROTOMY N/A 07/25/2018   Procedure: EXPLORATORY LAPAROTOMY;  Surgeon: Rolm Bookbinder, MD;  Location: [place];  Service: General;  Laterality: N/A;  . PARTIAL COLECTOMY N/A 07/25/2018   Procedure: COLECTOMY;  Surgeon: Rolm Bookbinder, MD;  Location: [place];  Service: General;  Laterality: N/A;  . Right leg surgery  age 2   As a child for  ?scleroderma per patient  . TONSILLECTOMY AND ADENOIDECTOMY    . TUBAL LIGATION    . Ureteral surgery  2011   rt ureterostomy-stent  There were no vitals filed for this visit.   Subjective Assessment - 06/02/20 1723    Subjective COVID 19 screening performed upon arrival. Patient reported L chronic knee pain.    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 Yes    Pain Score --   No pain score provided   Pain Location Knee    Pain Orientation Left    Pain Descriptors / Indicators Discomfort      Pain Type Chronic pain    Pain Onset More than a month ago    Pain Frequency Constant              [facility] PT Assessment - 06/02/20 0001      Assessment   Medical Diagnosis Balance Disorder, Weakness    Referring Provider (PT) [name], MD    Prior Therapy no      Precautions   Precautions Fall      Restrictions   Weight Bearing Restrictions No                         [facility] Adult PT Treatment/Exercise - 06/02/20 0001      Exercises   Exercises Knee/Hip      Knee/Hip Exercises: Aerobic   Nustep L3 x18 min      Knee/Hip Exercises: Seated   Long Arc Quad Strengthening;Both;2 sets;10 reps;Weights    Long Arc Quad Weight 4 lbs.    Ball Squeeze x20 reps 5 sec    Clamshell with TheraBand Red   x20 reps   Other Seated Knee/Hip Exercises B heel/toe raise x20 reps    Marching AROM;Both;2 sets;10 reps    Hamstring Curl Strengthening;Both;2 sets;10 reps;Limitations    Hamstring Limitations red theraband    Sit to Sand 15 reps;without UE support   hands on thighs                      PT Long Term Goals - 05/31/20 2026      PT LONG TERM GOAL #1   Title Patient will be independent with HEP and its progression.    Time 6    Period Weeks    Status New      PT LONG TERM GOAL #2   Title Patient will demonstrate 4/5 or greater bilateral LE MMT to improve stability during functional tasks.    Time 6    Period Weeks    Status New      PT LONG TERM GOAL #3   Title Patient will decrease risk of falls as noted by a 6+ point improvement on Berg Balance Scale    Time 6    Period Weeks    Status New      PT LONG TERM GOAL #4   Title Patient will improve LE strength as noted by the ability to perform modified 5x sit sit to stand in 20 seconds or less.    Time 6    Period Weeks    Status New                 Plan - 06/02/20 1616    Clinical Impression Statement Patient presented in clinic with reports of L knee pain that is  chronic. Patient is currently  awaiting L TKR. Greatest weakness and ROM limitations noted in LLE. No complaints of pain or fatigue during therex. Patient compliant with HEP at this time and will use ice for inflammation control at night.    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    Examination-Activity Limitations Locomotion Level;Transfers;Stairs;Stand    Stability/Clinical Decision Making Stable/Uncomplicated    Rehab Potential Good    PT Frequency 2x / week    PT Duration 6 weeks    PT Treatment/Interventions ADLs/Self Care Home Management;Cryotherapy;Electrical Stimulation;Moist Heat;Ultrasound;Neuromuscular re-education;Balance training;Therapeutic exercise;Therapeutic activities;Functional mobility training;Stair training;Gait training;Patient/family education;Passive range of motion;Manual techniques;Vasopneumatic Device;Taping    PT Next Visit Plan Complete Berg Balance Scale; LE strengthening, stretching, balance activities in various positions, modalities for L knee PRN for pain relief.    PT Home Exercise Plan see patient education section    Consulted and Agree with Plan of Care Patient           Patient will benefit from skilled therapeutic intervention in order to improve the following deficits and impairments:  Abnormal gait, Difficulty walking, Decreased range of motion, Decreased strength, Decreased activity tolerance, Decreased balance, Pain, Postural dysfunction  Visit Diagnosis: Muscle weakness (generalized)  Chronic pain of left knee  Difficulty in walking, not elsewhere classified  Unsteadiness on feet     Problem List Patient Active Problem List   Diagnosis Date Noted  . Hypokalemia   . Edema of left lower extremity   . Debility 08/06/2018  . Hyponatremia 07/24/2018  . Osteoporosis 08/27/2017  . Low vitamin  D level 06/04/2017  . Frequent UTI 10/25/2015  . Microcytic anemia 08/30/2015  . Diverticulosis 08/30/2015  . Hemangioma   . History of TIA (transient ischemic attack) 08/13/2015  . Rheumatoid arthritis ([place]) 06/20/2015  . AAA (abdominal aortic aneurysm) without rupture ([place]) 01/28/2015  . Persistent atrial fibrillation ([place]) 11/17/2014  . History of scleroderma 02/17/2014  . Malignant neoplasm of upper-outer quadrant of right breast in female, estrogen receptor positive ([place]) 02/01/2014  . Anxiety and depression 12/07/2011  . High risk medication use 06/14/2011  . CAD S/P percutaneous coronary angioplasty   . Type 2 diabetes mellitus with complication, without long-term current use of insulin ([place]) 06/07/2008  . Dyslipidemia, goal LDL below 70 07/22/2007  . Essential hypertension 07/22/2007    Standley Brooking, PTA 06/02/2020, 5:35 PM  [facility] [address] [place], [place], [address] Phone: [phone]   Fax:  [phone]  Name: Melody Casey MRN: [id number] Date of Birth: 1946/09/17

## 2020-06-06 ENCOUNTER — Encounter: Payer: Self-pay | Admitting: Physical Therapy

## 2020-06-06 ENCOUNTER — Ambulatory Visit: Payer: Medicare Other | Admitting: Physical Therapy

## 2020-06-06 ENCOUNTER — Other Ambulatory Visit: Payer: Self-pay

## 2020-06-06 DIAGNOSIS — R2681 Unsteadiness on feet: Secondary | ICD-10-CM

## 2020-06-06 DIAGNOSIS — M6281 Muscle weakness (generalized): Secondary | ICD-10-CM

## 2020-06-06 DIAGNOSIS — R262 Difficulty in walking, not elsewhere classified: Secondary | ICD-10-CM

## 2020-06-06 DIAGNOSIS — M25562 Pain in left knee: Secondary | ICD-10-CM

## 2020-06-06 NOTE — Therapy (Signed)
[facility] [place], [place], [address] Phone: [phone]   Fax:  [phone]  Physical Therapy Treatment  Patient Details  Name: Melody Casey MRN: [id number] Date of Birth: 06-03-1946 Referring Provider (PT): [name], MD   Encounter Date: 06/06/2020   PT End of Session - 06/06/20 1315    Visit Number 3    Number of Visits 12    Date for PT Re-Evaluation 07/19/20    Authorization Type Progress note every 10th visit; KX modifier at 15th visit    PT Start Time 1300    PT Stop Time 1344    PT Time Calculation (min) 44 min    Activity Tolerance Patient tolerated treatment well           Past Medical History:  Diagnosis Date  . AAA (abdominal aortic aneurysm) ([place])   . Acute diverticulitis   . Anxiety   . Arthritis   . Atrial fibrillation ([place])    a. Dx 03/2011 - on tikosyn/coumadin.  Marland Kitchen CAD (coronary artery disease)    a. NSTEMI 02/2011: occ mid Cx, DES to OM2, residual nonobst LAD dz.  . Cancer ([place])    breast  . Diabetes mellitus   . Diverticulitis   . Diverticulitis of colon     2008, 04/2011, 12/2014, 08/2015  . 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 Hemangioma    liver  . HLD (hyperlipidemia)   . HTN (hypertension)   . Osteoporosis   . Tobacco abuse    stopped smoking 2012  . Ureteral obstruction    History of gross hematuria/right hydronephrosis 2/2 to uteropelvic junction obstruction, s/p cystoscopy in January 2007 with bilateral retrograde pyelography, right ureter arthroscopy, right ureteral stent placement, bladder biopsies, stent removal since then.  . Wears dentures    top    Past Surgical History:  Procedure Laterality Date  . BIOPSY N/A 11/01/2015   Procedure: BIOPSY;  Surgeon: Danie Binder, MD;  [facility];  Service: Endoscopy;  Laterality: N/A;  . BREAST LUMPECTOMY WITH NEEDLE LOCALIZATION AND AXILLARY SENTINEL LYMPH NODE BX Right 03/01/2014   Procedure:  BREAST LUMPECTOMY WITH NEEDLE LOCALIZATION AND AXILLARY SENTINEL LYMPH NODE BX;  Surgeon: Edward Jolly, MD;  Location: [place];  Service: General;  Laterality: Right;  . BREAST SURGERY    . CARDIAC CATHETERIZATION  2012   stent  . COLON RESECTION N/A 07/25/2018   Procedure: COLOSTOMY;  Surgeon: Rolm Bookbinder, MD;  Location: [place];  Service: General;  Laterality: N/A;  . COLONOSCOPY WITH PROPOFOL N/A 06/29/2014   Dr. Wynetta Emery: universal diverticulosis  . CORONARY STENT PLACEMENT  2012  . ESOPHAGOGASTRODUODENOSCOPY (EGD) WITH PROPOFOL N/A 11/01/2015   Procedure: ESOPHAGOGASTRODUODENOSCOPY (EGD) WITH PROPOFOL;  Surgeon: Danie Binder, MD;  [facility];  Service: Endoscopy;  Laterality: N/A;  . KNEE ARTHROSCOPY     left  . LAPAROTOMY N/A 07/25/2018   Procedure: EXPLORATORY LAPAROTOMY;  Surgeon: Rolm Bookbinder, MD;  Location: [place];  Service: General;  Laterality: N/A;  . PARTIAL COLECTOMY N/A 07/25/2018   Procedure: COLECTOMY;  Surgeon: Rolm Bookbinder, MD;  Location: [place];  Service: General;  Laterality: N/A;  . Right leg surgery  age 41   As a child for  ?scleroderma per patient  . TONSILLECTOMY AND ADENOIDECTOMY    . TUBAL LIGATION    . Ureteral surgery  2011   rt ureterostomy-stent    There were no vitals filed for this visit.  Subjective Assessment - 06/06/20 1314    Subjective COVID 19 screening performed upon arrival. Patient reported doing well after last session with reports of soreness.    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 No/denies              [facility] PT Assessment - 06/06/20 0001      Assessment   Medical Diagnosis Balance Disorder, Weakness    Referring Provider (PT) [name], MD    Prior Therapy no      Precautions   Precautions Fall      Berg Balance Test   Sit to Stand Able to stand  independently using hands    Standing Unsupported Able to stand safely 2 minutes    Sitting with Back Unsupported but Feet Supported on Floor or Stool Able to sit safely and securely 2 minutes    Stand to Sit Controls descent by using hands    Transfers Able to transfer safely, definite need of hands    Standing Unsupported with Eyes Closed Able to stand 10 seconds with supervision    Standing Unsupported with Feet Together Able to place feet together independently and stand for 1 minute with supervision    From Standing, Reach Forward with Outstretched Arm Can reach forward >12 cm safely (5")    From Standing Position, Pick up Object from Floor Able to pick up shoe, needs supervision    From Standing Position, Turn to Look Behind Over each Shoulder Turn sideways only but maintains balance    Turn 360 Degrees Able to turn 360 degrees safely but slowly    Standing Unsupported, Alternately Place Feet on Step/Stool Able to complete >2 steps/needs minimal assist    Standing Unsupported, One Foot in Front Needs help to step but can hold 15 seconds    Standing on One Leg Tries to lift leg/unable to hold 3 seconds but remains standing independently    Total Score 36                         [facility] Adult PT Treatment/Exercise - 06/06/20 0001      Exercises   Exercises Knee/Hip      Knee/Hip Exercises: Stretches   Passive Hamstring Stretch Left;2 reps;30 seconds   with assist from PT     Knee/Hip Exercises: Aerobic   Nustep L3 x15 min      Knee/Hip Exercises: Seated   Long Arc Quad Strengthening;Both;2 sets;10 reps;Weights    Long Arc Quad Weight 4 lbs.    Clamshell with TheraBand --    Other Seated Knee/Hip Exercises DF with red theraband 2x10 bilaterally    Marching AROM;Both;2 sets;10 reps    Marching Weights 4 lbs.    Hamstring Curl  Strengthening;Both;2 sets;10 reps;Limitations    Hamstring Limitations red theraband      Knee/Hip Exercises: Supine   Bridges Strengthening;Both;2 sets;5 reps    Other Supine Knee/Hip Exercises ball squeeze x3" hold 2x10 followed by clam shells 2x10 red theraband                       PT Long Term Goals - 05/31/20 2026      PT LONG TERM GOAL #1   Title Patient  will be independent with HEP and its progression.    Time 6    Period Weeks    Status New      PT LONG TERM GOAL #2   Title Patient will demonstrate 4/5 or greater bilateral LE MMT to improve stability during functional tasks.    Time 6    Period Weeks    Status New      PT LONG TERM GOAL #3   Title Patient will decrease risk of falls as noted by a 6+ point improvement on Berg Balance Scale    Time 6    Period Weeks    Status New      PT LONG TERM GOAL #4   Title Patient will improve LE strength as noted by the ability to perform modified 5x sit sit to stand in 20 seconds or less.    Time 6    Period Weeks    Status New                 Plan - 06/06/20 1356    Clinical Impression Statement Patient responded well to therapy session with minimal increase of pain in L knee. Berg completeed; score categorizes her as a moderate fall risk. Patient guided throught TEs with verbal cuing for form and technique. Patient able to complete remaining reps with good form.    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    Examination-Activity Limitations Locomotion Level;Transfers;Stairs;Stand    Stability/Clinical Decision Making Stable/Uncomplicated    Clinical Decision Making Moderate    Rehab Potential Good    PT Frequency 2x / week    PT Duration 6 weeks    PT Treatment/Interventions ADLs/Self Care Home Management;Cryotherapy;Electrical Stimulation;Moist  Heat;Ultrasound;Neuromuscular re-education;Balance training;Therapeutic exercise;Therapeutic activities;Functional mobility training;Stair training;Gait training;Patient/family education;Passive range of motion;Manual techniques;Vasopneumatic Device;Taping    PT Next Visit Plan LE strengthening, stretching, balance activities in various positions, modalities for L knee PRN for pain relief.    PT Home Exercise Plan see patient education section    Consulted and Agree with Plan of Care Patient           Patient will benefit from skilled therapeutic intervention in order to improve the following deficits and impairments:  Abnormal gait, Difficulty walking, Decreased range of motion, Decreased strength, Decreased activity tolerance, Decreased balance, Pain, Postural dysfunction  Visit Diagnosis: Muscle weakness (generalized)  Chronic pain of left knee  Difficulty in walking, not elsewhere classified  Unsteadiness on feet     Problem List Patient Active Problem List   Diagnosis Date Noted  . Hypokalemia   . Edema of left lower extremity   . Debility 08/06/2018  . Hyponatremia 07/24/2018  . Osteoporosis 08/27/2017  . Low vitamin D level 06/04/2017  . Frequent UTI 10/25/2015  . Microcytic anemia 08/30/2015  . Diverticulosis 08/30/2015  . Hemangioma   . History of TIA (transient ischemic attack) 08/13/2015  . Rheumatoid arthritis ([place]) 06/20/2015  . AAA (abdominal aortic aneurysm) without rupture ([place]) 01/28/2015  . Persistent atrial fibrillation ([place]) 11/17/2014  . History of scleroderma 02/17/2014  . Malignant neoplasm of upper-outer quadrant of right breast in female, estrogen receptor positive ([place]) 02/01/2014  . Anxiety and depression 12/07/2011  . High risk medication use 06/14/2011  . CAD S/P percutaneous coronary angioplasty   . Type 2 diabetes mellitus with complication, without long-term current use of insulin ([place]) 06/07/2008  .  Dyslipidemia, goal LDL below 70  07/22/2007  . Essential hypertension 07/22/2007    Gabriela Eves, PT, DPT 06/06/2020, 2:03 PM  [facility] [address] [place], [place], [address] Phone: [phone]   Fax:  [phone]  Name: Melody Casey MRN: [id number] Date of Birth: 06-02-1946

## 2020-06-09 ENCOUNTER — Other Ambulatory Visit: Payer: Self-pay

## 2020-06-09 ENCOUNTER — Encounter: Payer: Self-pay | Admitting: Physical Therapy

## 2020-06-09 ENCOUNTER — Ambulatory Visit: Payer: Medicare Other | Attending: Family Medicine | Admitting: Physical Therapy

## 2020-06-09 DIAGNOSIS — R262 Difficulty in walking, not elsewhere classified: Secondary | ICD-10-CM | POA: Diagnosis not present

## 2020-06-09 DIAGNOSIS — M6281 Muscle weakness (generalized): Secondary | ICD-10-CM | POA: Diagnosis not present

## 2020-06-09 DIAGNOSIS — R2681 Unsteadiness on feet: Secondary | ICD-10-CM

## 2020-06-09 DIAGNOSIS — R5381 Other malaise: Secondary | ICD-10-CM | POA: Diagnosis not present

## 2020-06-09 DIAGNOSIS — M25562 Pain in left knee: Secondary | ICD-10-CM | POA: Diagnosis not present

## 2020-06-09 DIAGNOSIS — G8929 Other chronic pain: Secondary | ICD-10-CM | POA: Diagnosis not present

## 2020-06-09 NOTE — Therapy (Signed)
[facility] [place], [place], [address] Phone: [phone]   Fax:  [phone]  Physical Therapy Treatment  Patient Details  Name: Melody Casey MRN: [id number] Date of Birth: [birth year] Referring Provider (PT): [name], MD   Encounter Date: 06/09/2020   PT End of Session - 06/09/20 1443    Visit Number 4    Number of Visits 12    Date for PT Re-Evaluation 07/19/20    Authorization Type Progress note every 10th visit; KX modifier at 15th visit    PT Start Time 1345    PT Stop Time 1429    PT Time Calculation (min) 44 min    Activity Tolerance Patient tolerated treatment well    Behavior During Therapy [facility] for tasks assessed/performed           Past Medical History:  Diagnosis Date  . AAA (abdominal aortic aneurysm) ([place])   . Acute diverticulitis   . Anxiety   . Arthritis   . Atrial fibrillation ([place])    a. Dx 03/2011 - on tikosyn/coumadin.  Marland Kitchen CAD (coronary artery disease)    a. NSTEMI 02/2011: occ mid Cx, DES to OM2, residual nonobst LAD dz.  . Cancer ([place])    breast  . Diabetes mellitus   . Diverticulitis   . Diverticulitis of colon     2008, 04/2011, 12/2014, 08/2015  . 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 Hemangioma    liver  . HLD (hyperlipidemia)   . HTN (hypertension)   . Osteoporosis   . Tobacco abuse    stopped smoking 2012  . Ureteral obstruction    History of gross hematuria/right hydronephrosis 2/2 to uteropelvic junction obstruction, s/p cystoscopy in January 2007 with bilateral retrograde pyelography, right ureter arthroscopy, right ureteral stent placement, bladder biopsies, stent removal since then.  . Wears dentures    top    Past Surgical History:  Procedure Laterality Date  . BIOPSY N/A 11/01/2015   Procedure: BIOPSY;  Surgeon: Danie Binder, MD;  Location: [facility];  Service: Endoscopy;  Laterality: N/A;  . BREAST LUMPECTOMY WITH NEEDLE LOCALIZATION AND  AXILLARY SENTINEL LYMPH NODE BX Right 03/01/2014   Procedure: BREAST LUMPECTOMY WITH NEEDLE LOCALIZATION AND AXILLARY SENTINEL LYMPH NODE BX;  Surgeon: Edward Jolly, MD;  Location: [place];  Service: General;  Laterality: Right;  . BREAST SURGERY    . CARDIAC CATHETERIZATION  2012   stent  . COLON RESECTION N/A 07/25/2018   Procedure: COLOSTOMY;  Surgeon: Rolm Bookbinder, MD;  Location: [place];  Service: General;  Laterality: N/A;  . COLONOSCOPY WITH PROPOFOL N/A 06/29/2014   Dr. Wynetta Emery: universal diverticulosis  . CORONARY STENT PLACEMENT  2012  . ESOPHAGOGASTRODUODENOSCOPY (EGD) WITH PROPOFOL N/A 11/01/2015   Procedure: ESOPHAGOGASTRODUODENOSCOPY (EGD) WITH PROPOFOL;  Surgeon: Danie Binder, MD;  Location[facility];  Service: Endoscopy;  Laterality: N/A;  . KNEE ARTHROSCOPY     left  . LAPAROTOMY N/A 07/25/2018   Procedure: EXPLORATORY LAPAROTOMY;  Surgeon: Rolm Bookbinder, MD;  Location: [place];  Service: General;  Laterality: N/A;  . PARTIAL COLECTOMY N/A 07/25/2018   Procedure: COLECTOMY;  Surgeon: Rolm Bookbinder, MD;  Location: [place];  Service: General;  Laterality: N/A;  . Right leg surgery  age 74   As a child for  ?scleroderma per patient  . TONSILLECTOMY AND ADENOIDECTOMY    . TUBAL LIGATION    . Ureteral surgery  2011   rt ureterostomy-stent  There were no vitals filed for this visit.   Subjective Assessment - 06/09/20 1350    Subjective COVID 19 screening performed upon arrival. Patient reported some soreness in L knee but overall doing well as she began walking without her cane.    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 No/denies              [facility] PT Assessment - 06/09/20 0001       Assessment   Medical Diagnosis Balance Disorder, Weakness    Referring Provider (PT) [name], MD    Prior Therapy no      Precautions   Precautions [facility] Adult PT Treatment/Exercise - 06/09/20 0001      Exercises   Exercises Knee/Hip      Knee/Hip Exercises: Aerobic   Nustep L3 x12 min      Knee/Hip Exercises: Standing   Hip Abduction AROM;Both;2 sets;10 reps;Knee straight    Other Standing Knee Exercises 6" step taps 2x10      Knee/Hip Exercises: Seated   Long Arc Quad Strengthening;Both;2 sets;10 reps;Weights    Long Arc Quad Weight 4 lbs.    Clamshell with TheraBand Red   x20   Hamstring Curl Strengthening;Both;2 sets;10 reps;Limitations    Hamstring Limitations red theraband      Knee/Hip Exercises: Supine   Short Arc Quad Sets Strengthening;Both;2 sets;10 reps    Heel Slides AAROM;2 sets;10 reps    Bridges Strengthening;Both;2 sets;5 reps    Straight Leg Raises AROM;2 sets;10 reps;Both    Other Supine Knee/Hip Exercises DF with red theraband x10               Balance Exercises - 06/09/20 0001      Balance Exercises: Seated   Dynamic Sitting Eyes opened;No lower extremity support;No upper extremity support;Lateral weight shift;Anterior/posterior weight shift;Reaching outside base of support   cone reaches 2x10 bilaterally                 PT Long Term Goals - 05/31/20 2026      PT LONG TERM GOAL #1   Title Patient will be independent with HEP and its progression.    Time 6    Period Weeks    Status New      PT LONG TERM GOAL #2   Title Patient will demonstrate 4/5 or greater bilateral LE MMT to improve stability during functional tasks.    Time 6    Period Weeks    Status New      PT LONG TERM GOAL #3   Title Patient will decrease risk of falls as noted by a 6+ point improvement on Berg Balance Scale    Time 6    Period Weeks    Status New      PT LONG TERM GOAL #4   Title Patient  will improve LE strength as noted by the ability to perform modified 5x sit sit to stand in 20 seconds or less.    Time 6    Period Weeks    Status New  Plan - 06/09/20 1435    Clinical Impression Statement Patient was able to complete treatment but with slight increase of pain with standing activities. Seated reaching out of BOS for seated balance initated with good response. Rest breaks provided with supine TEs secondary to left knee pain but was able to complete with good form. Patient with gait abnormalities secondary to leg length discrepancy but patient reported feeling confident but cautious walking without an AD today.    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    Examination-Activity Limitations Locomotion Level;Transfers;Stairs;Stand    Stability/Clinical Decision Making Stable/Uncomplicated    Clinical Decision Making Moderate    Rehab Potential Good    PT Frequency 2x / week    PT Duration 6 weeks    PT Treatment/Interventions ADLs/Self Care Home Management;Cryotherapy;Electrical Stimulation;Moist Heat;Ultrasound;Neuromuscular re-education;Balance training;Therapeutic exercise;Therapeutic activities;Functional mobility training;Stair training;Gait training;Patient/family education;Passive range of motion;Manual techniques;Vasopneumatic Device;Taping    PT Next Visit Plan LE strengthening, stretching, balance activities in various positions, modalities for L knee PRN for pain relief.    PT Home Exercise Plan see patient education section    Consulted and Agree with Plan of Care Patient           Patient will benefit from skilled therapeutic intervention in order to improve the following deficits and impairments:  Abnormal gait, Difficulty walking, Decreased range of motion, Decreased strength, Decreased activity  tolerance, Decreased balance, Pain, Postural dysfunction  Visit Diagnosis: Muscle weakness (generalized)  Chronic pain of left knee  Difficulty in walking, not elsewhere classified  Unsteadiness on feet     Problem List Patient Active Problem List   Diagnosis Date Noted  . Hypokalemia   . Edema of left lower extremity   . Debility 08/06/2018  . Hyponatremia 07/24/2018  . Osteoporosis 08/27/2017  . Low vitamin D level 06/04/2017  . Frequent UTI 10/25/2015  . Microcytic anemia 08/30/2015  . Diverticulosis 08/30/2015  . Hemangioma   . History of TIA (transient ischemic attack) 08/13/2015  . Rheumatoid arthritis ([place]) 06/20/2015  . AAA (abdominal aortic aneurysm) without rupture ([place]) 01/28/2015  . Persistent atrial fibrillation ([place]) 11/17/2014  . History of scleroderma 02/17/2014  . Malignant neoplasm of upper-outer quadrant of right breast in female, estrogen receptor positive ([place]) 02/01/2014  . Anxiety and depression 12/07/2011  . High risk medication use 06/14/2011  . CAD S/P percutaneous coronary angioplasty   . Type 2 diabetes mellitus with complication, without long-term current use of insulin ([place]) 06/07/2008  . Dyslipidemia, goal LDL below 70 07/22/2007  . Essential hypertension 07/22/2007    Gabriela Eves, PT, DPT 06/09/2020, 2:44 PM  [facility] [address] [place], [place], [address] Phone: [phone]   Fax:  [phone]  Name: Tyffany Waldrop MRN: [id number] Date of Birth: 19-Jul-1946

## 2020-06-15 ENCOUNTER — Other Ambulatory Visit: Payer: Self-pay

## 2020-06-15 ENCOUNTER — Encounter: Payer: Medicare Other | Admitting: Adult Health

## 2020-06-15 ENCOUNTER — Other Ambulatory Visit: Payer: Medicare Other

## 2020-06-15 ENCOUNTER — Ambulatory Visit: Payer: Medicare Other | Admitting: Physical Therapy

## 2020-06-15 DIAGNOSIS — R262 Difficulty in walking, not elsewhere classified: Secondary | ICD-10-CM | POA: Diagnosis not present

## 2020-06-15 DIAGNOSIS — M25562 Pain in left knee: Secondary | ICD-10-CM

## 2020-06-15 DIAGNOSIS — R5381 Other malaise: Secondary | ICD-10-CM | POA: Diagnosis not present

## 2020-06-15 DIAGNOSIS — M6281 Muscle weakness (generalized): Secondary | ICD-10-CM

## 2020-06-15 DIAGNOSIS — R2681 Unsteadiness on feet: Secondary | ICD-10-CM

## 2020-06-15 DIAGNOSIS — G8929 Other chronic pain: Secondary | ICD-10-CM | POA: Diagnosis not present

## 2020-06-15 NOTE — Therapy (Signed)
[facility] [place], [place], [address] Phone: [phone]   Fax:  [phone]  Physical Therapy Treatment  Patient Details  Name: Melody Casey MRN: [id number] Date of Birth: [birth year] Referring Provider (PT): [name], MD   Encounter Date: 06/15/2020   PT End of Session - 06/15/20 1442    Visit Number 5    Number of Visits 12    Date for PT Re-Evaluation 07/19/20    Authorization Type Progress note every 10th visit; KX modifier at 15th visit    PT Start Time 0230    PT Stop Time 0312    PT Time Calculation (min) 42 min    Activity Tolerance Patient tolerated treatment well    Behavior During Therapy [facility] for tasks assessed/performed           Past Medical History:  Diagnosis Date  . AAA (abdominal aortic aneurysm) ([place])   . Acute diverticulitis   . Anxiety   . Arthritis   . Atrial fibrillation ([place])    a. Dx 03/2011 - on tikosyn/coumadin.  Marland Kitchen CAD (coronary artery disease)    a. NSTEMI 02/2011: occ mid Cx, DES to OM2, residual nonobst LAD dz.  . Cancer ([place])    breast  . Diabetes mellitus   . Diverticulitis   . Diverticulitis of colon     2008, 04/2011, 12/2014, 08/2015  . 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 Hemangioma    liver  . HLD (hyperlipidemia)   . HTN (hypertension)   . Osteoporosis   . Tobacco abuse    stopped smoking 2012  . Ureteral obstruction    History of gross hematuria/right hydronephrosis 2/2 to uteropelvic junction obstruction, s/p cystoscopy in January 2007 with bilateral retrograde pyelography, right ureter arthroscopy, right ureteral stent placement, bladder biopsies, stent removal since then.  . Wears dentures    top    Past Surgical History:  Procedure Laterality Date  . BIOPSY N/A 11/01/2015   Procedure: BIOPSY;  Surgeon: Danie Binder, MD;  [facility];  Service: Endoscopy;  Laterality: N/A;  . BREAST LUMPECTOMY WITH NEEDLE LOCALIZATION AND  AXILLARY SENTINEL LYMPH NODE BX Right 03/01/2014   Procedure: BREAST LUMPECTOMY WITH NEEDLE LOCALIZATION AND AXILLARY SENTINEL LYMPH NODE BX;  Surgeon: Edward Jolly, MD;  Location: [place];  Service: General;  Laterality: Right;  . BREAST SURGERY    . CARDIAC CATHETERIZATION  2012   stent  . COLON RESECTION N/A 07/25/2018   Procedure: COLOSTOMY;  Surgeon: Rolm Bookbinder, MD;  Location: [place];  Service: General;  Laterality: N/A;  . COLONOSCOPY WITH PROPOFOL N/A 06/29/2014   Dr. Wynetta Emery: universal diverticulosis  . CORONARY STENT PLACEMENT  2012  . ESOPHAGOGASTRODUODENOSCOPY (EGD) WITH PROPOFOL N/A 11/01/2015   Procedure: ESOPHAGOGASTRODUODENOSCOPY (EGD) WITH PROPOFOL;  Surgeon: Danie Binder, MD;  Location[facility];  Service: Endoscopy;  Laterality: N/A;  . KNEE ARTHROSCOPY     left  . LAPAROTOMY N/A 07/25/2018   Procedure: EXPLORATORY LAPAROTOMY;  Surgeon: Rolm Bookbinder, MD;  Location: [place];  Service: General;  Laterality: N/A;  . PARTIAL COLECTOMY N/A 07/25/2018   Procedure: COLECTOMY;  Surgeon: Rolm Bookbinder, MD;  Location: [place];  Service: General;  Laterality: N/A;  . Right leg surgery  age 41   As a child for  ?scleroderma per patient  . TONSILLECTOMY AND ADENOIDECTOMY    . TUBAL LIGATION    . Ureteral surgery  2011   rt ureterostomy-stent  There were no vitals filed for this visit.   Subjective Assessment - 06/15/20 1441    Subjective COVID 19 screening performed upon arrival. 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 No/denies              [facility] PT Assessment - 06/15/20 0001      Berg Balance Test   Sit to Stand Able to stand   independently using hands    Standing Unsupported Able to stand safely 2 minutes    Sitting with Back Unsupported but Feet Supported on Floor or Stool Able to sit safely and securely 2 minutes    Stand to Sit Controls descent by using hands    Transfers Able to transfer safely, definite need of hands    Standing Unsupported with Eyes Closed Able to stand 10 seconds with supervision    Standing Unsupported with Feet Together Able to place feet together independently and stand for 1 minute with supervision    From Standing, Reach Forward with Outstretched Arm Can reach forward >12 cm safely (5")    From Standing Position, Pick up Object from Floor Able to pick up shoe, needs supervision    From Standing Position, Turn to Look Behind Over each Shoulder Turn sideways only but maintains balance    Turn 360 Degrees Able to turn 360 degrees safely but slowly    Standing Unsupported, Alternately Place Feet on Step/Stool Able to complete >2 steps/needs minimal assist    Standing Unsupported, One Foot in Front Needs help to step but can hold 15 seconds    Standing on One Leg Tries to lift leg/unable to hold 3 seconds but remains standing independently    Total Score 36                         [facility] Adult PT Treatment/Exercise - 06/15/20 0001      Knee/Hip Exercises: Aerobic   Nustep L3 x12 min      Knee/Hip Exercises: Seated   Long Arc Quad Strengthening;Both;2 sets;10 reps;Weights    Long Arc Quad Weight 4 lbs.    Hamstring Curl Strengthening;Both;2 sets;10 reps;Limitations    Hamstring Limitations red theraband      Knee/Hip Exercises: Supine   Bridges Strengthening;Both;20 reps    Straight Leg Raises Strengthening;Both;2 sets;10 reps    Other Supine Knee/Hip Exercises clamshell red band x30    Other Supine Knee/Hip Exercises DF with red theraband 2x10               Balance Exercises - 06/15/20 0001      Balance Exercises: Standing   Standing Eyes Opened Wide  (BOA);Foam/compliant surface;4 reps   59min   Step Ups Forward;6 inch;UE support 1   toe taps 2x10   Marching Solid surface;Upper extremity assist 1   2x10   Heel Raises Both;10 reps;Limitations    Sit to Stand Standard surface;Upper extremity support   x10                 PT Long Term Goals - 06/15/20 1443      PT LONG TERM GOAL #1   Title Patient will be independent with HEP and  its progression.    Baseline Patient doing well with inital yet not ready for progression 06/15/20    Time 6    Period Weeks    Status On-going      PT LONG TERM GOAL #2   Title Patient will demonstrate 4/5 or greater bilateral LE MMT to improve stability during functional tasks.    Time 6    Period Weeks    Status On-going      PT LONG TERM GOAL #3   Title Patient will decrease risk of falls as noted by a 6+ point improvement on Berg Balance Scale    Baseline 36/56 BERG no improvement today 06/15/20    Time 6    Period Weeks    Status On-going      PT LONG TERM GOAL #4   Title Patient will improve LE strength as noted by the ability to perform modified 5x sit sit to stand in 20 seconds or less.    Time 6    Period Weeks    Status On-going                 Plan - 06/15/20 1500    Clinical Impression Statement Patient tolerated treatment well today. Patient able to progress with exercises yet limitations with left LE. Patient had no improvement with BERG score today. Patient reported doing well with initail HEP yet not ready for a progression at this time. Patient current goals ongoing.    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    Examination-Activity Limitations Locomotion Level;Transfers;Stairs;Stand    Stability/Clinical Decision Making Stable/Uncomplicated    Rehab Potential Good    PT Frequency 2x / week    PT Duration 6 weeks    PT  Treatment/Interventions ADLs/Self Care Home Management;Cryotherapy;Electrical Stimulation;Moist Heat;Ultrasound;Neuromuscular re-education;Balance training;Therapeutic exercise;Therapeutic activities;Functional mobility training;Stair training;Gait training;Patient/family education;Passive range of motion;Manual techniques;Vasopneumatic Device;Taping    PT Next Visit Plan cont with POC LE strengthening, stretching, balance activities in various positions, modalities for L knee PRN for pain relief.    Consulted and Agree with Plan of Care Patient           Patient will benefit from skilled therapeutic intervention in order to improve the following deficits and impairments:  Abnormal gait, Difficulty walking, Decreased range of motion, Decreased strength, Decreased activity tolerance, Decreased balance, Pain, Postural dysfunction  Visit Diagnosis: Muscle weakness (generalized)  Chronic pain of left knee  Difficulty in walking, not elsewhere classified  Unsteadiness on feet     Problem List Patient Active Problem List   Diagnosis Date Noted  . Hypokalemia   . Edema of left lower extremity   . Debility 08/06/2018  . Hyponatremia 07/24/2018  . Osteoporosis 08/27/2017  . Low vitamin D level 06/04/2017  . Frequent UTI 10/25/2015  . Microcytic anemia 08/30/2015  . Diverticulosis 08/30/2015  . Hemangioma   . History of TIA (transient ischemic attack) 08/13/2015  . Rheumatoid arthritis ([place]) 06/20/2015  . AAA (abdominal aortic aneurysm) without rupture ([place]) 01/28/2015  . Persistent atrial fibrillation ([place]) 11/17/2014  . History of scleroderma 02/17/2014  . Malignant neoplasm of upper-outer quadrant of right breast in female, estrogen receptor positive ([place]) 02/01/2014  . Anxiety and depression 12/07/2011  . High risk medication use 06/14/2011  . CAD S/P percutaneous coronary angioplasty   . Type 2 diabetes mellitus with complication, without long-term current use of insulin (  [place])  06/07/2008  . Dyslipidemia, goal LDL below 70 07/22/2007  . Essential hypertension 07/22/2007    Phillips Climes, PTA 06/15/2020, 3:13 PM  [facility] [address] [place], [place], [address] Phone: [phone]   Fax:  [phone]  Name: Melody Casey MRN: [id number] Date of Birth: Nov 21, 1946

## 2020-06-16 ENCOUNTER — Encounter: Payer: Medicare Other | Admitting: Adult Health

## 2020-06-16 ENCOUNTER — Other Ambulatory Visit: Payer: Medicare Other

## 2020-06-20 ENCOUNTER — Other Ambulatory Visit: Payer: Self-pay

## 2020-06-20 ENCOUNTER — Ambulatory Visit: Payer: Medicare Other | Admitting: Physical Therapy

## 2020-06-20 ENCOUNTER — Encounter: Payer: Self-pay | Admitting: Physical Therapy

## 2020-06-20 DIAGNOSIS — M25562 Pain in left knee: Secondary | ICD-10-CM

## 2020-06-20 DIAGNOSIS — M6281 Muscle weakness (generalized): Secondary | ICD-10-CM | POA: Diagnosis not present

## 2020-06-20 DIAGNOSIS — R2681 Unsteadiness on feet: Secondary | ICD-10-CM | POA: Diagnosis not present

## 2020-06-20 DIAGNOSIS — R5381 Other malaise: Secondary | ICD-10-CM | POA: Diagnosis not present

## 2020-06-20 DIAGNOSIS — R262 Difficulty in walking, not elsewhere classified: Secondary | ICD-10-CM | POA: Diagnosis not present

## 2020-06-20 DIAGNOSIS — G8929 Other chronic pain: Secondary | ICD-10-CM | POA: Diagnosis not present

## 2020-06-20 NOTE — Therapy (Signed)
[facility] [place], [place], [address] Phone: [phone]   Fax:  [phone]  Physical Therapy Treatment  Patient Details  Name: Melody Casey MRN: [id number] Date of Birth: Dec 13, 1945 Referring Provider (PT): [name], MD   Encounter Date: 06/20/2020   PT End of Session - 06/20/20 0852    Visit Number 6    Number of Visits 12    Date for PT Re-Evaluation 07/19/20    Authorization Type Progress note every 10th visit; KX modifier at 15th visit    PT Start Time 0901    PT Stop Time 0946    PT Time Calculation (min) 45 min    Equipment Utilized During Treatment Other (comment)   SPC   Activity Tolerance Patient limited by pain    Behavior During Therapy [facility] for tasks assessed/performed           Past Medical History:  Diagnosis Date  . AAA (abdominal aortic aneurysm) ([place])   . Acute diverticulitis   . Anxiety   . Arthritis   . Atrial fibrillation ([place])    a. Dx 03/2011 - on tikosyn/coumadin.  Marland Kitchen CAD (coronary artery disease)    a. NSTEMI 02/2011: occ mid Cx, DES to OM2, residual nonobst LAD dz.  . Cancer ([place])    breast  . Diabetes mellitus   . Diverticulitis   . Diverticulitis of colon     2008, 04/2011, 12/2014, 08/2015  . 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 Hemangioma    liver  . HLD (hyperlipidemia)   . HTN (hypertension)   . Osteoporosis   . Tobacco abuse    stopped smoking 2012  . Ureteral obstruction    History of gross hematuria/right hydronephrosis 2/2 to uteropelvic junction obstruction, s/p cystoscopy in January 2007 with bilateral retrograde pyelography, right ureter arthroscopy, right ureteral stent placement, bladder biopsies, stent removal since then.  . Wears dentures    top    Past Surgical History:  Procedure Laterality Date  . BIOPSY N/A 11/01/2015   Procedure: BIOPSY;  Surgeon: Danie Binder, MD;  Location: [facility];  Service: Endoscopy;  Laterality: N/A;  .  BREAST LUMPECTOMY WITH NEEDLE LOCALIZATION AND AXILLARY SENTINEL LYMPH NODE BX Right 03/01/2014   Procedure: BREAST LUMPECTOMY WITH NEEDLE LOCALIZATION AND AXILLARY SENTINEL LYMPH NODE BX;  Surgeon: [place] Jolly, MD;  Location: [place];  Service: General;  Laterality: Right;  . BREAST SURGERY    . CARDIAC CATHETERIZATION  2012   stent  . COLON RESECTION N/A 07/25/2018   Procedure: COLOSTOMY;  Surgeon: Rolm Bookbinder, MD;  Location: [place];  Service: General;  Laterality: N/A;  . COLONOSCOPY WITH PROPOFOL N/A 06/29/2014   Dr. Wynetta Emery: universal diverticulosis  . CORONARY STENT PLACEMENT  2012  . ESOPHAGOGASTRODUODENOSCOPY (EGD) WITH PROPOFOL N/A 11/01/2015   Procedure: ESOPHAGOGASTRODUODENOSCOPY (EGD) WITH PROPOFOL;  Surgeon: Danie Binder, MD;  [facility];  Service: Endoscopy;  Laterality: N/A;  . KNEE ARTHROSCOPY     left  . LAPAROTOMY N/A 07/25/2018   Procedure: EXPLORATORY LAPAROTOMY;  Surgeon: Rolm Bookbinder, MD;  Location: [place];  Service: General;  Laterality: N/A;  . PARTIAL COLECTOMY N/A 07/25/2018   Procedure: COLECTOMY;  Surgeon: Rolm Bookbinder, MD;  Location: [place];  Service: General;  Laterality: N/A;  . Right leg surgery  age 48   As a child for  ?scleroderma per patient  . TONSILLECTOMY AND ADENOIDECTOMY    . TUBAL LIGATION    .  Ureteral surgery  2011   rt ureterostomy-stent    There were no vitals filed for this visit.   Subjective Assessment - 06/20/20 0852    Subjective COVID 19 screening performed upon arrival. Patient reports she has more pain in her knees with locking episodes this morning. Patient reports she is on a new medication for RA.    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 Yes    Pain Score 7     Pain Location Knee    Pain Orientation Left    Pain Descriptors / Indicators Discomfort    Pain Type Chronic pain    Pain Onset More than a month ago    Pain Frequency Constant              [facility] PT Assessment - 06/20/20 0001      Assessment   Medical Diagnosis Balance Disorder, Weakness    Referring Provider (PT) [name], MD    Prior Therapy no      Precautions   Precautions Fall      Restrictions   Weight Bearing Restrictions No                         [facility] Adult PT Treatment/Exercise - 06/20/20 0001      Knee/Hip Exercises: Aerobic   Nustep L5, seat 10 x15 min      Knee/Hip Exercises: Standing   Rocker Board 2 minutes      Knee/Hip Exercises: Seated   Long Arc Quad Strengthening;Both;2 sets;10 reps;Weights    Long Arc Quad Weight 4 lbs.    Ball Squeeze x20 reps 5 sec    Clamshell with TheraBand Red   x20 reps   Hamstring Curl Strengthening;Both;2 sets;10 reps;Limitations    Hamstring Limitations red theraband      Modalities   Modalities Electrical Stimulation;Vasopneumatic      Electrical Stimulation   Electrical Stimulation Location L medial knee    Electrical Stimulation Action Pre-Mod    Electrical Stimulation Parameters 80-150 hz x15 min    Electrical Stimulation Goals Pain;Edema      Vasopneumatic   Number Minutes Vasopneumatic  15 minutes    Vasopnuematic Location  Knee    Vasopneumatic Pressure Medium    Vasopneumatic Temperature  69 for pain/edema control                       PT Long Term Goals - 06/15/20 1443      PT LONG TERM GOAL #1   Title Patient will be independent with HEP and its progression.    Baseline Patient doing well with inital yet not ready for progression 06/15/20    Time 6    Period Weeks    Status On-going      PT LONG TERM GOAL #2   Title Patient will demonstrate 4/5 or greater bilateral LE MMT to improve stability during functional tasks.    Time  6    Period Weeks    Status On-going      PT LONG TERM GOAL #3   Title Patient will decrease risk of falls as noted by a 6+ point improvement on Berg Balance Scale    Baseline 36/56 BERG no improvement today  06/15/20    Time 6    Period Weeks    Status On-going      PT LONG TERM GOAL #4   Title Patient will improve LE strength as noted by the ability to perform modified 5x sit sit to stand in 20 seconds or less.    Time 6    Period Weeks    Status On-going                 Plan - 06/20/20 6578    Clinical Impression Statement Patient presented in clinic with reports of increased L knee pain. Patient reports vacuuming yesterday and starting new RA medication recently. Patient guided through light LE strengthening with intermittant rubbing of L knee. Rockerboard implimented today as patient reported HS tightness and pain as well. Normal modalities response noted following removal of the modalities. Patient reported she may use compression bandage once she returned home.    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    Examination-Activity Limitations Locomotion Level;Transfers;Stairs;Stand    Stability/Clinical Decision Making Stable/Uncomplicated    Rehab Potential Good    PT Frequency 2x / week    PT Duration 6 weeks    PT Treatment/Interventions ADLs/Self Care Home Management;Cryotherapy;Electrical Stimulation;Moist Heat;Ultrasound;Neuromuscular re-education;Balance training;Therapeutic exercise;Therapeutic activities;Functional mobility training;Stair training;Gait training;Patient/family education;Passive range of motion;Manual techniques;Vasopneumatic Device;Taping    PT Next Visit Plan cont with POC LE strengthening, stretching, balance activities in various positions, modalities for L knee PRN for pain relief.    PT Home Exercise  Plan see patient education section    Consulted and Agree with Plan of Care Patient           Patient will benefit from skilled therapeutic intervention in order to improve the following deficits and impairments:  Abnormal gait, Difficulty walking, Decreased range of motion, Decreased strength, Decreased activity tolerance, Decreased balance, Pain, Postural dysfunction  Visit Diagnosis: Muscle weakness (generalized)  Chronic pain of left knee  Difficulty in walking, not elsewhere classified  Unsteadiness on feet     Problem List Patient Active Problem List   Diagnosis Date Noted  . Hypokalemia   . Edema of left lower extremity   . Debility 08/06/2018  . Hyponatremia 07/24/2018  . Osteoporosis 08/27/2017  . Low vitamin D level 06/04/2017  . Frequent UTI 10/25/2015  . Microcytic anemia 08/30/2015  . Diverticulosis 08/30/2015  . Hemangioma   . History of TIA (transient ischemic attack) 08/13/2015  . Rheumatoid arthritis ([place]) 06/20/2015  . AAA (abdominal aortic aneurysm) without rupture ([place]) 01/28/2015  . Persistent atrial fibrillation ([place]) 11/17/2014  . History of scleroderma 02/17/2014  . Malignant neoplasm of upper-outer quadrant of right breast in female, estrogen receptor positive ([place]) 02/01/2014  . Anxiety and depression 12/07/2011  . High risk medication use 06/14/2011  . CAD S/P percutaneous coronary angioplasty   . Type 2 diabetes mellitus with complication, without long-term current use of insulin ([place]) 06/07/2008  . Dyslipidemia, goal LDL below 70 07/22/2007  . Essential hypertension 07/22/2007    Standley Brooking, PTA 06/20/2020, 9:50 AM  [facility] [address] [place], [place], [address] Phone: [phone]   Fax:  [phone]  Name: Melody Casey MRN: [id number] Date of Birth: [birth year]

## 2020-06-23 ENCOUNTER — Inpatient Hospital Stay: Payer: Medicare Other | Attending: Adult Health

## 2020-06-23 ENCOUNTER — Ambulatory Visit: Payer: Medicare Other | Admitting: Physical Therapy

## 2020-06-23 ENCOUNTER — Other Ambulatory Visit: Payer: Self-pay

## 2020-06-23 ENCOUNTER — Encounter: Payer: Self-pay | Admitting: Adult Health

## 2020-06-23 ENCOUNTER — Inpatient Hospital Stay (HOSPITAL_BASED_OUTPATIENT_CLINIC_OR_DEPARTMENT_OTHER): Payer: Medicare Other | Admitting: Adult Health

## 2020-06-23 ENCOUNTER — Encounter: Payer: Self-pay | Admitting: Physical Therapy

## 2020-06-23 VITALS — BP 164/55 | HR 60 | Temp 98.7°F | Resp 18 | Ht 67.0 in | Wt 195.8 lb

## 2020-06-23 DIAGNOSIS — Z853 Personal history of malignant neoplasm of breast: Secondary | ICD-10-CM | POA: Diagnosis not present

## 2020-06-23 DIAGNOSIS — G8929 Other chronic pain: Secondary | ICD-10-CM

## 2020-06-23 DIAGNOSIS — M25562 Pain in left knee: Secondary | ICD-10-CM | POA: Diagnosis not present

## 2020-06-23 DIAGNOSIS — R2681 Unsteadiness on feet: Secondary | ICD-10-CM

## 2020-06-23 DIAGNOSIS — M6281 Muscle weakness (generalized): Secondary | ICD-10-CM

## 2020-06-23 DIAGNOSIS — C50411 Malignant neoplasm of upper-outer quadrant of right female breast: Secondary | ICD-10-CM | POA: Diagnosis not present

## 2020-06-23 DIAGNOSIS — Z170 Estrogen receptor positive status [ER+]: Secondary | ICD-10-CM

## 2020-06-23 DIAGNOSIS — M810 Age-related osteoporosis without current pathological fracture: Secondary | ICD-10-CM | POA: Insufficient documentation

## 2020-06-23 DIAGNOSIS — R262 Difficulty in walking, not elsewhere classified: Secondary | ICD-10-CM

## 2020-06-23 DIAGNOSIS — R5381 Other malaise: Secondary | ICD-10-CM | POA: Diagnosis not present

## 2020-06-23 LAB — CBC WITH DIFFERENTIAL/PLATELET
Basophils Absolute: 0 10*3/uL (ref 0.0–0.1)
Basophils Relative: 1 %
Eosinophils Absolute: 0.2 10*3/uL (ref 0.0–0.5)
Eosinophils Relative: 3 %
HCT: 39.7 % (ref 36.0–46.0)
Hemoglobin: 12.9 g/dL (ref 12.0–15.0)
Lymphocytes Relative: 25 %
Lymphs Abs: 1.4 10*3/uL (ref 0.7–4.0)
MCH: 29.3 pg (ref 26.0–34.0)
MCHC: 32.5 g/dL (ref 30.0–36.0)
MCV: 90.2 fL (ref 80.0–100.0)
Monocytes Absolute: 0.6 10*3/uL (ref 0.1–1.0)
Monocytes Relative: 10 %
Neutro Abs: 3.5 10*3/uL (ref 1.7–7.7)
Neutrophils Relative %: 62 %
Platelets: 189 10*3/uL (ref 150–400)
RBC: 4.4 MIL/uL (ref 3.87–5.11)
RDW: 14.7 % (ref 11.5–15.5)
WBC: 5.6 10*3/uL (ref 4.0–10.5)
nRBC: 0 % (ref 0.0–0.2)

## 2020-06-23 LAB — COMPREHENSIVE METABOLIC PANEL
ALT: 13 U/L (ref 0–44)
AST: 16 U/L (ref 15–41)
Albumin: 3.5 g/dL (ref 3.5–5.0)
Alkaline Phosphatase: 115 U/L (ref 38–126)
Anion gap: 9 (ref 5–15)
BUN: 16 mg/dL (ref 8–23)
CO2: 24 mmol/L (ref 22–32)
Calcium: 10.9 mg/dL — ABNORMAL HIGH (ref 8.9–10.3)
Chloride: 107 mmol/L (ref 98–111)
Creatinine, Ser: 0.82 mg/dL (ref 0.44–1.00)
GFR calc Af Amer: >60 mL/min (ref >60)
GFR calc non Af Amer: >60 mL/min (ref >60)
Glucose, Bld: 162 mg/dL — ABNORMAL HIGH (ref 70–99)
Potassium: 4 mmol/L (ref 3.5–5.1)
Sodium: 140 mmol/L (ref 135–145)
Total Bilirubin: 0.4 mg/dL (ref 0.3–1.2)
Total Protein: 7.8 g/dL (ref 6.5–8.1)

## 2020-06-23 NOTE — Therapy (Addendum)
[facility] [place], [place], [address] Phone: [phone]   Fax:  [phone]  Physical Therapy Treatment PHYSICAL THERAPY DISCHARGE SUMMARY  Visits from Start of Care: 7  Current functional level related to goals / functional outcomes: See below   Remaining deficits: See goals   Education / Equipment: HEP Plan: Patient agrees to discharge.  Patient goals were not met. Patient is being discharged due to not returning since the last visit.  ?????  Gabriela Eves, PT, DPT  02/10/21    Patient Details  Name: Melody Casey MRN: [id number] Date of Birth: 12-Jul-1946 Referring Provider (PT): [name], MD   Encounter Date: 06/23/2020   PT End of Session - 06/23/20 0959    Visit Number 7    Number of Visits 12    Date for PT Re-Evaluation 07/19/20    Authorization Type Progress note every 10th visit; KX modifier at 15th visit    PT Start Time 0945    PT Stop Time 1035    PT Time Calculation (min) 50 min    Equipment Utilized During Treatment Other (comment)    Activity Tolerance Patient limited by pain    Behavior During Therapy [facility] for tasks assessed/performed           Past Medical History:  Diagnosis Date  . AAA (abdominal aortic aneurysm) ([place])   . Acute diverticulitis   . Anxiety   . Arthritis   . Atrial fibrillation ([place])    a. Dx 03/2011 - on tikosyn/coumadin.  Marland Kitchen CAD (coronary artery disease)    a. NSTEMI 02/2011: occ mid Cx, DES to OM2, residual nonobst LAD dz.  . Cancer ([place])    breast  . Diabetes mellitus   . Diverticulitis   . Diverticulitis of colon     2008, 04/2011, 12/2014, 08/2015  . 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 Hemangioma    liver  . HLD (hyperlipidemia)   . HTN (hypertension)   . Osteoporosis   . Tobacco abuse    stopped smoking 2012  . Ureteral obstruction    History of gross hematuria/right hydronephrosis 2/2 to uteropelvic junction obstruction,  s/p cystoscopy in January 2007 with bilateral retrograde pyelography, right ureter arthroscopy, right ureteral stent placement, bladder biopsies, stent removal since then.  . Wears dentures    top    Past Surgical History:  Procedure Laterality Date  . BIOPSY N/A 11/01/2015   Procedure: BIOPSY;  Surgeon: Danie Binder, MD;  [facility];  Service: Endoscopy;  Laterality: N/A;  . BREAST LUMPECTOMY WITH NEEDLE LOCALIZATION AND AXILLARY SENTINEL LYMPH NODE BX Right 03/01/2014   Procedure: BREAST LUMPECTOMY WITH NEEDLE LOCALIZATION AND AXILLARY SENTINEL LYMPH NODE BX;  Surgeon: [place] Jolly, MD;  Location: [place];  Service: General;  Laterality: Right;  . BREAST SURGERY    . CARDIAC CATHETERIZATION  2012   stent  . COLON RESECTION N/A 07/25/2018   Procedure: COLOSTOMY;  Surgeon: Rolm Bookbinder, MD;  Location: [place];  Service: General;  Laterality: N/A;  . COLONOSCOPY WITH PROPOFOL N/A 06/29/2014   Dr. Wynetta Emery: universal diverticulosis  . CORONARY STENT PLACEMENT  2012  . ESOPHAGOGASTRODUODENOSCOPY (EGD) WITH PROPOFOL N/A 11/01/2015   Procedure: ESOPHAGOGASTRODUODENOSCOPY (EGD) WITH PROPOFOL;  Surgeon: Danie Binder, MD;  [facility];  Service: Endoscopy;  Laterality: N/A;  . KNEE ARTHROSCOPY     left  . LAPAROTOMY N/A 07/25/2018   Procedure: EXPLORATORY LAPAROTOMY;  Surgeon: Rolm Bookbinder,  MD;  Location: [place];  Service: General;  Laterality: N/A;  . PARTIAL COLECTOMY N/A 07/25/2018   Procedure: COLECTOMY;  Surgeon: Rolm Bookbinder, MD;  Location: [place];  Service: General;  Laterality: N/A;  . Right leg surgery  age 74   As a child for  ?scleroderma per patient  . TONSILLECTOMY AND ADENOIDECTOMY    . TUBAL LIGATION    . Ureteral surgery  2011   rt ureterostomy-stent    There were no vitals filed for this visit.   Subjective Assessment - 06/23/20 0956    Subjective Covid 19 screen performed prior to entering clinic. Pt reporting she is  having more problems with her L knee. Pt reporting she hasn't done any house work yet this morning. Pt reporting walking a few days without her cane but feels like she needs it constantly now.    Limitations House hold activities;Walking    Patient Stated Goals improve balance, decrease pain, improve movement and strength    Pain Score 3     Pain Location Knee    Pain Orientation Left    Pain Descriptors / Indicators Discomfort    Pain Onset More than a month ago    Pain Frequency Constant                             [facility] Adult PT Treatment/Exercise - 06/23/20 0001      Knee/Hip Exercises: Aerobic   Nustep L5, seat 10 x 10 min      Knee/Hip Exercises: Standing   Heel Raises Both;15 reps;2 seconds    Heel Raises Limitations toe raises x 15 holding 2 seocnds each    Hip Flexion Stengthening;Both;20 reps;Knee bent    Hip Flexion Limitations lateral left knee instability noted with SLS    Hip Abduction Stengthening;Left;20 reps;Knee straight    Abduction Limitations UE support    Other Standing Knee Exercises hamstring curls bilaterally with UE support x 10 each LE      Knee/Hip Exercises: Seated   Long Arc Quad Strengthening;Both;2 sets;10 reps;Weights    Long Arc Quad Weight 4 lbs.    Ball Squeeze x20 reps 5 sec    Clamshell with TheraBand Red   x 20   Hamstring Curl Strengthening;Both;2 sets;10 reps;Limitations    Hamstring Limitations red theraband      Modalities   Modalities Electrical Stimulation;Vasopneumatic      Electrical Stimulation   Electrical Stimulation Location L medial knee    Electrical Stimulation Action pre-mod    Electrical Stimulation Parameters 80-150 Hz x 10 minutes    Electrical Stimulation Goals Edema;Pain      Vasopneumatic   Number Minutes Vasopneumatic  --    Vasopnuematic Location  --    Vasopneumatic Pressure --    Vasopneumatic Temperature  --                  PT Education - 06/23/20 0959    Education Details  Exercises techniques    Person(s) Educated Patient    Methods Explanation;Demonstration    Comprehension Verbalized understanding;Returned demonstration               PT Long Term Goals - 06/23/20 1029      PT LONG TERM GOAL #1   Title Patient will be independent with HEP and its progression.    Baseline Patient doing well with inital yet not ready for progression 06/15/20    Status On-going  PT LONG TERM GOAL #2   Title Patient will demonstrate 4/5 or greater bilateral LE MMT to improve stability during functional tasks.    Status On-going      PT LONG TERM GOAL #3   Title Patient will decrease risk of falls as noted by a 6+ point improvement on Berg Balance Scale      PT LONG TERM GOAL #4   Title Patient will improve LE strength as noted by the ability to perform modified 5x sit sit to stand in 20 seconds or less.    Status On-going                 Plan - 06/23/20 1017    Clinical Impression Statement Pt presenting to therapy today with 3/10 pain in L knee. Pt reporting she has been doing her HEP.  Housework seems to make her pain worse. During single leg stance activites today pt with lateral left knee instability with increased valgus. Continue to focus on LE strengthening as pt tolerates. Pt reporting 2/10 pain at end of session.    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    Examination-Activity Limitations Locomotion Level;Transfers;Stairs;Stand    Stability/Clinical Decision Making Stable/Uncomplicated    Rehab Potential Good    PT Frequency 2x / week    PT Duration 6 weeks    PT Treatment/Interventions ADLs/Self Care Home Management;Cryotherapy;Electrical Stimulation;Moist Heat;Ultrasound;Neuromuscular re-education;Balance training;Therapeutic exercise;Therapeutic activities;Functional mobility training;Stair  training;Gait training;Patient/family education;Passive range of motion;Manual techniques;Vasopneumatic Device;Taping    PT Next Visit Plan cont with POC LE strengthening, stretching, balance activities in various positions, modalities for L knee PRN for pain relief.    PT Home Exercise Plan see patient education section    Consulted and Agree with Plan of Care Patient           Patient will benefit from skilled therapeutic intervention in order to improve the following deficits and impairments:  Abnormal gait, Difficulty walking, Decreased range of motion, Decreased strength, Decreased activity tolerance, Decreased balance, Pain, Postural dysfunction  Visit Diagnosis: Muscle weakness (generalized)  Chronic pain of left knee  Difficulty in walking, not elsewhere classified  Unsteadiness on feet  Debility     Problem List Patient Active Problem List   Diagnosis Date Noted  . Hypokalemia   . Edema of left lower extremity   . Debility 08/06/2018  . Hyponatremia 07/24/2018  . Osteoporosis 08/27/2017  . Low vitamin D level 06/04/2017  . Frequent UTI 10/25/2015  . Microcytic anemia 08/30/2015  . Diverticulosis 08/30/2015  . Hemangioma   . History of TIA (transient ischemic attack) 08/13/2015  . Rheumatoid arthritis ([place]) 06/20/2015  . AAA (abdominal aortic aneurysm) without rupture ([place]) 01/28/2015  . Persistent atrial fibrillation ([place]) 11/17/2014  . History of scleroderma 02/17/2014  . Malignant neoplasm of upper-outer quadrant of right breast in female, estrogen receptor positive ([place]) 02/01/2014  . Anxiety and depression 12/07/2011  . High risk medication use 06/14/2011  . CAD S/P percutaneous coronary angioplasty   . Type 2 diabetes mellitus with complication, without long-term current use of insulin ([place]) 06/07/2008  . Dyslipidemia, goal LDL below 70 07/22/2007  . Essential hypertension 07/22/2007    Oretha Caprice, PT, MPT 06/23/2020, 10:38 AM  [facility] [address] [place], [place], [address] Phone: [phone]   Fax:  [phone]  Name: Vinia Jemmott MRN: [id number]  Date of Birth: [birth year]

## 2020-06-23 NOTE — Progress Notes (Signed)
CLINIC:  Survivorship   REASON FOR VISIT:  Routine follow-up for history of breast cancer.   BRIEF ONCOLOGIC HISTORY:  Per Dr. Virgie Dad note from 06/2018 74 y.o. [place] woman status post right breast upper outer quadrant biopsy  01/28/2014 for a clinical T1b N0, stage IA invasive ductal carcinoma, grade 1, estrogen and progesterone receptor positive, HER-2 not amplified, with an MIB-1 of 26%  (1) Biopsy of 2 additional suspicious areas in the right breast 02/03/2014 and 02/10/2014 showed only atypical ductal hyperplasia  (2) status post right lumpectomy and sentinel lymph node sampling 03/01/2014 showed an additional 3 mm area of invasive ductal carcinoma, grade 1, with negative margins. The single sentinel lymph node was negative.  (3) Opted against adjuvant radiation given the marginal benefit of local recurrence  (4) started anastrozole 03/29/2014, interrupted May 2016 with joint swelling; restarted 06/20/15             (a) osteoporosis with T score -3.5 on bone density scan 04/11/2015             (b) bone density on 04/30/2017 showed a T score of -3.7 osteoporosis  (c) Anastrozole completed in 03/2020   INTERVAL HISTORY:  Melody Casey presents to the [facility] today for routine follow-up for her history of breast cancer.  Overall, she reports feeling quite well.    Her most recent bone density was completed on 05/27/2020 and showed osteoporosis with a score of 4.2 in the right femoral neck.  She is taking fosamax weekly.  She sometimes forgets to take this.  She is scheduled for mammogram in 07/2020.    She remains as active as she can and up to date with her PCP visits and cancer screenings.     REVIEW OF SYSTEMS:  Review of Systems  Constitutional: Negative for appetite change, chills, fatigue, fever and unexpected weight change.  HENT:   Negative for hearing loss and lump/mass.   Eyes: Negative for eye problems and icterus.  Respiratory: Negative for chest  tightness, cough and shortness of breath.   Cardiovascular: Negative for chest pain, leg swelling and palpitations.  Gastrointestinal: Negative for abdominal distention and abdominal pain.  Endocrine: Negative for hot flashes.  Genitourinary: Negative for difficulty urinating.   Musculoskeletal: Positive for arthralgias.  Neurological: Negative for dizziness, extremity weakness, headaches and numbness.  Hematological: Negative for adenopathy. Does not bruise/bleed easily.  Psychiatric/Behavioral: Negative for depression. The patient is not nervous/anxious.    Breast: Denies any new nodularity, masses, tenderness, nipple changes, or nipple discharge.       PAST MEDICAL/SURGICAL HISTORY:  Past Medical History:  Diagnosis Date  . AAA (abdominal aortic aneurysm) ([place])   . Acute diverticulitis   . Anxiety   . Arthritis   . Atrial fibrillation ([place])    a. Dx 03/2011 - on tikosyn/coumadin.  Marland Kitchen CAD (coronary artery disease)    a. NSTEMI 02/2011: occ mid Cx, DES to OM2, residual nonobst LAD dz.  . Cancer ([place])    breast  . Diabetes mellitus   . Diverticulitis   . Diverticulitis of colon     2008, 04/2011, 12/2014, 08/2015  . 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 Hemangioma    liver  . HLD (hyperlipidemia)   . HTN (hypertension)   . Osteoporosis   . Tobacco abuse    stopped smoking 2012  . Ureteral obstruction    History of gross hematuria/right hydronephrosis 2/2 to uteropelvic junction obstruction, s/p cystoscopy in January 2007 with  bilateral retrograde pyelography, right ureter arthroscopy, right ureteral stent placement, bladder biopsies, stent removal since then.  . Wears dentures    top   Past Surgical History:  Procedure Laterality Date  . BIOPSY N/A 11/01/2015   Procedure: BIOPSY;  Surgeon: Danie Binder, MD;  Location: [facility];  Service: Endoscopy;  Laterality: N/A;  . BREAST LUMPECTOMY WITH NEEDLE LOCALIZATION AND AXILLARY SENTINEL LYMPH NODE BX Right 03/01/2014     Procedure: BREAST LUMPECTOMY WITH NEEDLE LOCALIZATION AND AXILLARY SENTINEL LYMPH NODE BX;  Surgeon: Edward Jolly, MD;  Location: [place];  Service: General;  Laterality: Right;  . BREAST SURGERY    . CARDIAC CATHETERIZATION  2012   stent  . COLON RESECTION N/A 07/25/2018   Procedure: COLOSTOMY;  Surgeon: Rolm Bookbinder, MD;  Location: [place];  Service: General;  Laterality: N/A;  . COLONOSCOPY WITH PROPOFOL N/A 06/29/2014   Dr. Wynetta Emery: universal diverticulosis  . CORONARY STENT PLACEMENT  2012  . ESOPHAGOGASTRODUODENOSCOPY (EGD) WITH PROPOFOL N/A 11/01/2015   Procedure: ESOPHAGOGASTRODUODENOSCOPY (EGD) WITH PROPOFOL;  Surgeon: Danie Binder, MD;  Location: [facility];  Service: Endoscopy;  Laterality: N/A;  . KNEE ARTHROSCOPY     left  . LAPAROTOMY N/A 07/25/2018   Procedure: EXPLORATORY LAPAROTOMY;  Surgeon: Rolm Bookbinder, MD;  Location: [place];  Service: General;  Laterality: N/A;  . PARTIAL COLECTOMY N/A 07/25/2018   Procedure: COLECTOMY;  Surgeon: Rolm Bookbinder, MD;  Location: [place];  Service: General;  Laterality: N/A;  . Right leg surgery  age 31   As a child for  ?scleroderma per patient  . TONSILLECTOMY AND ADENOIDECTOMY    . TUBAL LIGATION    . Ureteral surgery  2011   rt ureterostomy-stent     ALLERGIES:  Allergies  Allergen Reactions  . Miralax [Polyethylene Glycol] Swelling and Rash    Took [employer] brand developed rash. Patient states she tolerated name brand MiraLax in the past.  09/01/15. Patient states she had diffuse swelling including swelling of her lips.   . Vancomycin Rash and Shortness Of Breath  . Acetaminophen Hives  . Oxycodone-Acetaminophen Itching  . Sulfa Antibiotics Rash    All over rash  . Sulfacetamide Sodium Rash    All over rash  . Sulfasalazine Rash    All over rash All over rash  . Banana Other (See Comments)    Unknown  . Latex Rash  . Tape Rash     CURRENT MEDICATIONS:  Outpatient Encounter Medications  as of 06/23/2020  Medication Sig  . alendronate (FOSAMAX) 70 MG tablet Take 70 mg by mouth once a week. Take with a full glass of water on an empty stomach.  . ALPRAZolam (XANAX) 0.25 MG tablet Take 1 tablet (0.25 mg total) by mouth 3 (three) times daily as needed for anxiety.  Marland Kitchen amLODipine (NORVASC) 5 MG tablet Take 1 tablet (5 mg total) by mouth daily.  Marland Kitchen atorvastatin (LIPITOR) 40 MG tablet TAKE 1 AND 1/2 TABLETS BY MOUTH EVERY DAY  . dofetilide (TIKOSYN) 250 MCG capsule Take 1 capsule (250 mcg total) by mouth 2 (two) times daily.  Marland Kitchen glucose blood (TRUETEST TEST) test strip Check blood sugar daily  . hydroxychloroquine (PLAQUENIL) 200 MG tablet Take 1 tablet (200 mg total) by mouth 2 (two) times daily.  Marland Kitchen leflunomide (ARAVA) 20 MG tablet Take 20 mg by mouth daily.  Marland Kitchen lisinopril (ZESTRIL) 20 MG tablet TAKE 1 TABLET(20 MG) BY MOUTH DAILY  . magnesium oxide (MAG-OX) 400 MG tablet  Take 400 mg by mouth 2 (two) times daily.  . metoprolol succinate (TOPROL-XL) 25 MG 24 hr tablet Take 1 tablet (25 mg total) by mouth daily.  . potassium chloride (K-DUR) 10 MEQ tablet Take 1 tablet by mouth once daily  . traMADol (ULTRAM) 50 MG tablet Take 1 tablet (50 mg total) by mouth every 6 (six) hours as needed for moderate pain or severe pain.  Marland Kitchen warfarin (COUMADIN) 5 MG tablet TAKE 1 AND 1/2 TABLETS(7.5 MG) BY MOUTH DAILY   No facility-administered encounter medications on file as of 06/23/2020.     ONCOLOGIC FAMILY HISTORY:  Family History  Problem Relation Age of Onset  . Lung cancer Father 62       deceased  . Cancer Father   . Heart failure Mother 67       deceased  . Heart disease Mother   . Diabetes Brother   . Hypertension Brother   . Cancer Brother        prostate  . Colon cancer Neg Hx   . Liver disease Neg Hx      SOCIAL HISTORY:  See above   PHYSICAL EXAMINATION:  Vital Signs: Vitals:   06/23/20 1432  BP: (!) 164/55  Pulse: 60  Resp: 18  Temp: 98.7 F (37.1 C)  SpO2: 94%     Filed Weights   06/23/20 1432  Weight: 195 lb 12.8 oz (88.8 kg)   General: Well-nourished, well-appearing female in no acute distress.  Unaccompanied today.   HEENT: Head is normocephalic.  Pupils equal and reactive to light. Conjunctivae clear without exudate.  Sclerae anicteric. Oral mucosa is pink, moist.  Oropharynx is pink without lesions or erythema.  Lymph: No cervical, supraclavicular, or infraclavicular lymphadenopathy noted on palpation.  Cardiovascular: Regular rate and rhythm.Marland Kitchen Respiratory: Clear to auscultation bilaterally. Chest expansion symmetric; breathing non-labored.  Breast Exam:  -Left breast: No appreciable masses on palpation. No skin redness, thickening, or peau d'orange appearance; no nipple retraction or nipple discharge;   -Right breast: No appreciable masses on palpation. No skin redness, thickening, or peau d'orange appearance; no nipple retraction or nipple discharge; mild distortion in symmetry at previous lumpectomy site well healed scar without erythema or nodularity. -Axilla: No axillary adenopathy bilaterally.  GI: Abdomen soft and round; non-tender, non-distended. Bowel sounds normoactive. No hepatosplenomegaly.   GU: Deferred.  Neuro: No focal deficits. Steady gait.  Psych: Mood and affect normal and appropriate for situation.  MSK: No focal spinal tenderness to palpation, full range of motion in bilateral upper extremities Extremities: No edema. Skin: Warm and dry.  LABORATORY DATA:  None for this visit   DIAGNOSTIC IMAGING:  Most recent mammogram:  Due in 07/2020   ASSESSMENT AND PLAN:  Ms.. Bahena is a pleasant 74 y.o. female with history of Stage IA right breast invasive ductal carcinoma, ER+/PR+/HER2-, diagnosed in 01/2014, treated with lumpectomy, and anti-estrogen therapy with Anastrozole x 5 years completed therapy in 03/2020.  [facility] for [facility] and routine follow-up.   1. History of breast cancer:   Melody Casey is currently clinically and radiographically without evidence of disease or recurrence of breast cancer. She will be due for mammogram in 07/2020.  She will return in one year for continued long term surveillance.  I encouraged her to call me with any questions or concerns before her next visit at the cancer center, and I would be happy to see her sooner, if needed.    2. Bone health:  Given Melody Casey's age, history of breast cancer, and her previous anti-estrogen therapy with Anastrozole, she is at risk for bone demineralization. She has osteoporosis and her most recent bone density in 05/2020 shows a continued decline in her bone density.  She is taking Fosamax, and we discussed calcium, vitamin d and weight bearing exercises.  Also, due to the fact that she stopped her Anastrozole, I am hopeful that her bone density will be improved when retested.    3. Cancer screening:  Due to Melody Casey's history and her age, she should receive screening for skin cancers, colon cancer. She was encouraged to follow-up with her PCP for appropriate cancer screenings.   4. Health maintenance and wellness promotion: Melody Casey was encouraged to consume 5-7 servings of fruits and vegetables per day. She was also encouraged to engage in moderate to vigorous exercise for 30 minutes per day most days of the week. She was instructed to limit her alcohol consumption and continue to abstain from tobacco use.    Dispo:  -Return to cancer center in one year for LTS follow up -Mammogram in 07/2020   Total encounter time: 20 minutes*   Gardenia Phlegm, NP [place] [phone]  *Total Encounter Time as defined by the Centers for Medicare and Medicaid Services includes, in addition to the face-to-face time of a patient visit (documented in the note above) non-face-to-face time: obtaining and reviewing outside history, ordering and reviewing medications, tests or  procedures, care coordination (communications with other health care professionals or caregivers) and documentation in the medical record.    Note: PRIMARY CARE PROVIDER Dettinger, [name], MD [phone]

## 2020-06-24 ENCOUNTER — Telehealth: Payer: Self-pay | Admitting: Adult Health

## 2020-06-24 NOTE — Telephone Encounter (Signed)
Called pt to schedule appt per 7/15 los. Pt stated that she did not want to schedule an appt at this time and would call back when she is ready to schedule something.

## 2020-06-28 ENCOUNTER — Ambulatory Visit: Payer: Medicare Other | Admitting: *Deleted

## 2020-06-30 ENCOUNTER — Encounter: Payer: Medicare Other | Admitting: Physical Therapy

## 2020-07-04 ENCOUNTER — Other Ambulatory Visit: Payer: Self-pay | Admitting: Cardiology

## 2020-07-06 DIAGNOSIS — M0579 Rheumatoid arthritis with rheumatoid factor of multiple sites without organ or systems involvement: Secondary | ICD-10-CM | POA: Diagnosis not present

## 2020-07-10 NOTE — Progress Notes (Signed)
Cardiology Office Note   Date:  07/11/2020   ID:  Lorean, Ekstrand 19-Mar-1946, MRN [id number]  PCP:  Dettinger, [name], MD  Cardiologist:   Minus Breeding, MD   Chief Complaint  Patient presents with  . Atrial Fibrillation      History of Present Illness: Melody Casey is a 73 y.o. female who presents for ongoing assessment and management of coronary artery disease, and history of paroxysmal atrial fibrillation.     She was started on Norvasc at the last visit.    Since I last saw her she has had no new complaints.  She is not noticing much in the way fibrillation except very brief episodes. The patient denies any new symptoms such as chest discomfort, neck or arm discomfort. There has been no new shortness of breath, PND or orthopnea. There have been no reported palpitations, presyncope or syncope.  Her biggest issues have been joint pain.  She might need to have knee surgery.  She has a colostomy and was thinking of having that reversed but was told it would be a big surgery with lots of scar tissue in recovery problems.  She is really not doing have it.  Past Medical History:  Diagnosis Date  . AAA (abdominal aortic aneurysm) ([place])   . Acute diverticulitis   . Anxiety   . Arthritis   . Atrial fibrillation ([place])    a. Dx 03/2011 - on tikosyn/coumadin.  Marland Kitchen CAD (coronary artery disease)    a. NSTEMI 02/2011: occ mid Cx, DES to OM2, residual nonobst LAD dz.  . Cancer ([place])    breast  . Diabetes mellitus   . Diverticulitis   . Diverticulitis of colon     2008, 04/2011, 12/2014, 08/2015  . 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 Hemangioma    liver  . HLD (hyperlipidemia)   . HTN (hypertension)   . Osteoporosis   . Tobacco abuse    stopped smoking 2012  . Ureteral obstruction    History of gross hematuria/right hydronephrosis 2/2 to uteropelvic junction obstruction, s/p cystoscopy in January 2007 with bilateral retrograde pyelography, right ureter  arthroscopy, right ureteral stent placement, bladder biopsies, stent removal since then.  . Wears dentures    top    Past Surgical History:  Procedure Laterality Date  . BIOPSY N/A 11/01/2015   Procedure: BIOPSY;  Surgeon: Danie Binder, MD;  Location[facility];  Service: Endoscopy;  Laterality: N/A;  . BREAST LUMPECTOMY WITH NEEDLE LOCALIZATION AND AXILLARY SENTINEL LYMPH NODE BX Right 03/01/2014   Procedure: BREAST LUMPECTOMY WITH NEEDLE LOCALIZATION AND AXILLARY SENTINEL LYMPH NODE BX;  Surgeon: Edward Jolly, MD;  Location: [place];  Service: General;  Laterality: Right;  . BREAST SURGERY    . CARDIAC CATHETERIZATION  2012   stent  . COLON RESECTION N/A 07/25/2018   Procedure: COLOSTOMY;  Surgeon: Rolm Bookbinder, MD;  Location: [place];  Service: General;  Laterality: N/A;  . COLONOSCOPY WITH PROPOFOL N/A 06/29/2014   Dr. Wynetta Emery: universal diverticulosis  . CORONARY STENT PLACEMENT  2012  . ESOPHAGOGASTRODUODENOSCOPY (EGD) WITH PROPOFOL N/A 11/01/2015   Procedure: ESOPHAGOGASTRODUODENOSCOPY (EGD) WITH PROPOFOL;  Surgeon: Danie Binder, MD;  [facility];  Service: Endoscopy;  Laterality: N/A;  . KNEE ARTHROSCOPY     left  . LAPAROTOMY N/A 07/25/2018   Procedure: EXPLORATORY LAPAROTOMY;  Surgeon: Rolm Bookbinder, MD;  Location: [place];  Service: General;  Laterality: N/A;  .  PARTIAL COLECTOMY N/A 07/25/2018   Procedure: COLECTOMY;  Surgeon: Rolm Bookbinder, MD;  Location: [place];  Service: General;  Laterality: N/A;  . Right leg surgery  age 56   As a child for  ?scleroderma per patient  . TONSILLECTOMY AND ADENOIDECTOMY    . TUBAL LIGATION    . Ureteral surgery  2011   rt ureterostomy-stent     Current Outpatient Medications  Medication Sig Dispense Refill  . ALPRAZolam (XANAX) 0.25 MG tablet Take 1 tablet (0.25 mg total) by mouth 3 (three) times daily as needed for anxiety. 30 tablet 2  . amLODipine (NORVASC) 5 MG tablet Take 1 tablet (5 mg  total) by mouth daily. 90 tablet 3  . atorvastatin (LIPITOR) 40 MG tablet TAKE 1 AND 1/2 TABLETS BY MOUTH EVERY DAY 45 tablet 3  . dofetilide (TIKOSYN) 250 MCG capsule Take 1 capsule (250 mcg total) by mouth 2 (two) times daily. 180 capsule 3  . hydroxychloroquine (PLAQUENIL) 200 MG tablet Take 1 tablet (200 mg total) by mouth 2 (two) times daily. 60 tablet 0  . leflunomide (ARAVA) 20 MG tablet Take 20 mg by mouth daily.    Marland Kitchen lisinopril (ZESTRIL) 20 MG tablet TAKE 1 TABLET(20 MG) BY MOUTH DAILY 90 tablet 0  . magnesium oxide (MAG-OX) 400 MG tablet Take 400 mg by mouth 2 (two) times daily.    . metoprolol succinate (TOPROL-XL) 25 MG 24 hr tablet Take 1 tablet (25 mg total) by mouth daily. 90 tablet 3  . potassium chloride (K-DUR) 10 MEQ tablet Take 1 tablet by mouth once daily 30 tablet 10  . warfarin (COUMADIN) 5 MG tablet TAKE 1 AND 1/2 TABLETS(7.5 MG) BY MOUTH DAILY 135 tablet 0   No current facility-administered medications for this visit.    Allergies:   Miralax [polyethylene glycol], Vancomycin, Acetaminophen, Oxycodone-acetaminophen, Sulfa antibiotics, Sulfacetamide sodium, Sulfasalazine, Banana, Latex, and Tape     ROS:  Please see the history of present illness.   Otherwise, review of systems are positive for none.   All other systems are reviewed and negative.    PHYSICAL EXAM: VS:  BP 126/80 (BP Location: Left Arm, Patient Position: Sitting, Cuff Size: Normal)   Pulse 61   Ht 5\' 8"  (1.727 m)   Wt 193 lb (87.5 kg)   BMI 29.35 kg/m  , BMI Body mass index is 29.35 kg/m. GENERAL:  Well appearing NECK:  No jugular venous distention, waveform within normal limits, carotid upstroke brisk and symmetric, no bruits, no thyromegaly LUNGS:  Clear to auscultation bilaterally CHEST:  Unremarkable HEART:  PMI not displaced or sustained,S1 and S2 within normal limits, no S3, no S4, no clicks, no rubs, no murmurs ABD:  Flat, positive bowel sounds normal in frequency in pitch, no bruits, no  rebound, no guarding, no midline pulsatile mass, no hepatomegaly, no splenomegaly, colostomy bag EXT:  2 plus pulses throughout, no edema, no cyanosis no clubbing   EKG:  EKG is ordered today. Normal sinus rhythm, rate 53, axis within normal limits, intervals within normal limits including QTC, no acute ST-T wave changes.  Recent Labs: 12/28/2019: Magnesium 2.0 05/23/2020: TSH 2.790 06/23/2020: ALT 13; BUN 16; Creatinine, Ser 0.82; Hemoglobin 12.9; Platelets 189; Potassium 4.0; Sodium 140    Lipid Panel    Component Value Date/Time   CHOL 127 05/23/2020 1034   TRIG 90 05/23/2020 1034   HDL 44 05/23/2020 1034   CHOLHDL 2.9 05/23/2020 1034   CHOLHDL 6.2 02/14/2011 0330   VLDL 26  02/14/2011 0330   LDLCALC 66 05/23/2020 1034   LDLDIRECT 158.6 04/05/2008 0833      Wt Readings from Last 3 Encounters:  07/11/20 193 lb (87.5 kg)  06/23/20 195 lb 12.8 oz (88.8 kg)  05/23/20 196 lb (88.9 kg)      Other studies Reviewed: Additional studies/ records that were reviewed today include: EKG. Review of the above records demonstrates: See elsewhere   ASSESSMENT AND PLAN:  Atrial fibrillation:    She is having very short paroxysms of this.  I did check an EKG today since I added amlodipine previously and there is an interaction with Tikosyn.  QTC is normal.  She had normal labs.  She tolerates anticoagulation.  No change in therapy.   Hypercholesterolemia:  LDL was 66 with an HDL of 44.  No change in therapy.  Hypertension:   Her blood pressures are at target with the amlodipine and she seems to be tolerating this.  No change in therapy.   Covid education:   She has been vaccinated.   Current medicines are reviewed at length with the patient today.  The patient does not have concerns regarding medicines.  The following changes have been made:  None  Labs/ tests ordered today include:   Orders Placed This Encounter  Procedures  . EKG 12-Lead     Disposition:   FU with me in 12  months.     Signed, Minus Breeding, MD  07/11/2020 12:25 PM    [facility]

## 2020-07-11 ENCOUNTER — Other Ambulatory Visit: Payer: Self-pay

## 2020-07-11 ENCOUNTER — Encounter: Payer: Self-pay | Admitting: Cardiology

## 2020-07-11 ENCOUNTER — Ambulatory Visit (INDEPENDENT_AMBULATORY_CARE_PROVIDER_SITE_OTHER): Payer: Medicare Other | Admitting: Cardiology

## 2020-07-11 VITALS — BP 126/80 | HR 61 | Ht 68.0 in | Wt 193.0 lb

## 2020-07-11 DIAGNOSIS — E785 Hyperlipidemia, unspecified: Secondary | ICD-10-CM | POA: Diagnosis not present

## 2020-07-11 DIAGNOSIS — I10 Essential (primary) hypertension: Secondary | ICD-10-CM

## 2020-07-11 DIAGNOSIS — Z7189 Other specified counseling: Secondary | ICD-10-CM

## 2020-07-11 DIAGNOSIS — I480 Paroxysmal atrial fibrillation: Secondary | ICD-10-CM

## 2020-07-11 NOTE — Patient Instructions (Signed)
Medication Instructions:  ?Your physician recommends that you continue on your current medications as directed. Please refer to the Current Medication list given to you today. ? ?*If you need a refill on your cardiac medications before your next appointment, please call your pharmacy* ? ?Lab Work: ?NONE ordered at this time of appointment  ? ?If you have labs (blood work) drawn today and your tests are completely normal, you will receive your results only by: ?MyChart Message (if you have MyChart) OR ?A paper copy in the mail ?If you have any lab test that is abnormal or we need to change your treatment, we will call you to review the results. ? ?Testing/Procedures: ?NONE ordered at this time of appointment  ? ?Follow-Up: ?At [facility], you and your health needs are our priority.  As part of our continuing mission to provide you with exceptional heart care, we have created designated Provider Care Teams.  These Care Teams include your primary Cardiologist (physician) and Advanced Practice Providers (APPs -  Physician Assistants and Nurse Practitioners) who all work together to provide you with the care you need, when you need it. ? ?Your next appointment:   ?6 month(s) ? ?The format for your next appointment:   ?In Person ? ?Provider:   ?James Hochrein, MD   ? ?Other Instructions ? ? ?

## 2020-07-14 ENCOUNTER — Encounter: Payer: Self-pay | Admitting: Family Medicine

## 2020-07-14 DIAGNOSIS — Z853 Personal history of malignant neoplasm of breast: Secondary | ICD-10-CM | POA: Diagnosis not present

## 2020-07-14 DIAGNOSIS — R928 Other abnormal and inconclusive findings on diagnostic imaging of breast: Secondary | ICD-10-CM | POA: Diagnosis not present

## 2020-07-18 ENCOUNTER — Other Ambulatory Visit: Payer: Self-pay

## 2020-07-18 ENCOUNTER — Ambulatory Visit (INDEPENDENT_AMBULATORY_CARE_PROVIDER_SITE_OTHER): Payer: Medicare Other | Admitting: Family Medicine

## 2020-07-18 ENCOUNTER — Encounter: Payer: Self-pay | Admitting: Family Medicine

## 2020-07-18 VITALS — BP 142/67 | HR 62 | Temp 97.8°F | Ht 68.0 in | Wt 192.0 lb

## 2020-07-18 DIAGNOSIS — Z79899 Other long term (current) drug therapy: Secondary | ICD-10-CM | POA: Diagnosis not present

## 2020-07-18 DIAGNOSIS — I4819 Other persistent atrial fibrillation: Secondary | ICD-10-CM | POA: Diagnosis not present

## 2020-07-18 LAB — COAGUCHEK XS/INR WAIVED
INR: 3.3 — ABNORMAL HIGH (ref 0.9–1.1)
Prothrombin Time: 39.3 s

## 2020-07-18 NOTE — Progress Notes (Signed)
BP (!) 142/67   Pulse 62   Temp 97.8 F (36.6 C)   Ht 5\' 8"  (1.727 m)   Wt 192 lb (87.1 kg)   SpO2 95%   BMI 29.19 kg/m    Subjective:   Patient ID: Melody Casey, female    DOB: [birth year], 74 y.o.   MRN: [id number]  HPI: Melody Casey is a 74 y.o. female presenting on 07/18/2020 for Medical Management of Chronic Issues and Atrial Fibrillation   HPI Coumadin recheck Target goal: 2.0-3.0 Reason on anticoagulation: A. fib Patient denies any bruising or bleeding or chest pain or palpitations   Relevant past medical, surgical, family and social history reviewed and updated as indicated. Interim medical history since our last visit reviewed. Allergies and medications reviewed and updated.  Review of Systems  Constitutional: Negative for chills and fever.  Eyes: Negative for visual disturbance.  Respiratory: Negative for chest tightness and shortness of breath.   Cardiovascular: Negative for chest pain, palpitations and leg swelling.  Gastrointestinal: Positive for abdominal distention, diarrhea and nausea.  Genitourinary: Negative for difficulty urinating.  Musculoskeletal: Negative for back pain and gait problem.  Skin: Negative for rash.  Neurological: Negative for light-headedness and headaches.  Psychiatric/Behavioral: Negative for agitation and behavioral problems.  All other systems reviewed and are negative.   Per HPI unless specifically indicated above   Allergies as of 07/18/2020      Reactions   Miralax [polyethylene Glycol] Swelling, Rash   Took [facility] brand developed rash. Patient states she tolerated name brand MiraLax in the past. 09/01/15. Patient states she had diffuse swelling including swelling of her lips.    Vancomycin Rash, Shortness Of Breath   Acetaminophen Hives   Oxycodone-acetaminophen Itching   Sulfa Antibiotics Rash   All over rash   Sulfacetamide Sodium Rash   All over rash   Sulfasalazine Rash   All over rash All over rash     Banana Other (See Comments)   Unknown   Latex Rash   Tape Rash      Medication List       Accurate as of July 18, 2020  1:12 PM. If you have any questions, ask your nurse or doctor.        ALPRAZolam 0.25 MG tablet Commonly known as: XANAX Take 1 tablet (0.25 mg total) by mouth 3 (three) times daily as needed for anxiety.   amLODipine 5 MG tablet Commonly known as: NORVASC Take 1 tablet (5 mg total) by mouth daily.   atorvastatin 40 MG tablet Commonly known as: LIPITOR TAKE 1 AND 1/2 TABLETS BY MOUTH EVERY DAY   dofetilide 250 MCG capsule Commonly known as: TIKOSYN Take 1 capsule (250 mcg total) by mouth 2 (two) times daily.   hydroxychloroquine 200 MG tablet Commonly known as: PLAQUENIL Take 1 tablet (200 mg total) by mouth 2 (two) times daily.   leflunomide 20 MG tablet Commonly known as: ARAVA Take 20 mg by mouth daily.   lisinopril 20 MG tablet Commonly known as: ZESTRIL TAKE 1 TABLET(20 MG) BY MOUTH DAILY   magnesium oxide 400 MG tablet Commonly known as: MAG-OX Take 400 mg by mouth 2 (two) times daily.   metoprolol succinate 25 MG 24 hr tablet Commonly known as: TOPROL-XL Take 1 tablet (25 mg total) by mouth daily.   potassium chloride 10 MEQ tablet Commonly known as: KLOR-CON Take 1 tablet by mouth once daily   warfarin 5 MG tablet Commonly known as: COUMADIN Take as directed  by the anticoagulation clinic. If you are unsure how to take this medication, talk to your nurse or doctor. Original instructions: TAKE 1 AND 1/2 TABLETS(7.5 MG) BY MOUTH DAILY        Objective:   BP (!) 142/67   Pulse 62   Temp 97.8 F (36.6 C)   Ht 5\' 8"  (1.727 m)   Wt 192 lb (87.1 kg)   SpO2 95%   BMI 29.19 kg/m   Wt Readings from Last 3 Encounters:  07/18/20 192 lb (87.1 kg)  07/11/20 193 lb (87.5 kg)  06/23/20 195 lb 12.8 oz (88.8 kg)    Physical Exam Vitals and nursing note reviewed.  Constitutional:      General: She is not in acute distress.     Appearance: She is well-developed. She is not diaphoretic.  Eyes:     Conjunctiva/sclera: Conjunctivae normal.  Abdominal:     General: Abdomen is flat. Bowel sounds are normal.     Comments: Colostomy in place but definitely follow more gas.  Neurological:     Mental Status: She is alert and oriented to person, place, and time.     Coordination: Coordination normal.  Psychiatric:        Behavior: Behavior normal.       Assessment & Plan:   Problem List Items Addressed This Visit      Cardiovascular and Mediastinum   Persistent atrial fibrillation ([place]) - Primary   Relevant Orders   CoaguChek XS/INR Waived     Other   High risk medication use      Description   Reduce dose to take warfarin 7.5 mg on Monday, and Friday only and 5 mg all other days.   INR 3.3 today. Goal 2.0-3.0  Recheck in 6 weeks    Recommended Pepcid AC twice daily for at least a month to see if it calms down her stomach. Follow up plan: Return if symptoms worsen or fail to improve, for Diabetes and A1c and INR in 6 to 8 weeks.  Counseling provided for all of the vaccine components Orders Placed This Encounter  Procedures  . CoaguChek XS/INR [place], MD [facility] 07/18/2020, 1:12 PM

## 2020-07-26 DIAGNOSIS — E119 Type 2 diabetes mellitus without complications: Secondary | ICD-10-CM | POA: Diagnosis not present

## 2020-07-26 DIAGNOSIS — H524 Presbyopia: Secondary | ICD-10-CM | POA: Diagnosis not present

## 2020-07-26 DIAGNOSIS — Z79899 Other long term (current) drug therapy: Secondary | ICD-10-CM | POA: Diagnosis not present

## 2020-07-26 DIAGNOSIS — M0569 Rheumatoid arthritis of multiple sites with involvement of other organs and systems: Secondary | ICD-10-CM | POA: Diagnosis not present

## 2020-07-26 DIAGNOSIS — H52223 Regular astigmatism, bilateral: Secondary | ICD-10-CM | POA: Diagnosis not present

## 2020-07-26 DIAGNOSIS — D3131 Benign neoplasm of right choroid: Secondary | ICD-10-CM | POA: Diagnosis not present

## 2020-07-26 DIAGNOSIS — H5202 Hypermetropia, left eye: Secondary | ICD-10-CM | POA: Diagnosis not present

## 2020-08-03 ENCOUNTER — Other Ambulatory Visit: Payer: Self-pay | Admitting: Cardiology

## 2020-08-08 ENCOUNTER — Ambulatory Visit (INDEPENDENT_AMBULATORY_CARE_PROVIDER_SITE_OTHER): Payer: Medicare Other | Admitting: Family

## 2020-08-08 ENCOUNTER — Encounter: Payer: Self-pay | Admitting: Family

## 2020-08-08 DIAGNOSIS — B029 Zoster without complications: Secondary | ICD-10-CM

## 2020-08-08 MED ORDER — VALACYCLOVIR HCL 1 G PO TABS: 1000.0000 mg | ORAL_TABLET | Freq: Three times a day (TID) | ORAL | 0 refills | Status: DC

## 2020-08-08 NOTE — Progress Notes (Signed)
   Virtual Visit via telephone Note Due to COVID-19 pandemic this visit was conducted virtually. This visit type was conducted due to national recommendations for restrictions regarding the COVID-19 Pandemic (e.g. social distancing, sheltering in place) in an effort to limit this patient's exposure and mitigate transmission in our community. All issues noted in this document were discussed and addressed.  A physical exam was not performed with this format.  I connected with Melody Casey on 08/08/20 at 11:09 AM by telephone and verified that I am speaking with the correct person using two identifiers. Melody Casey is currently located at doctor office and no one is currently with her during visit. The provider, Evelina Dun, FNP is located in their office at time of visit.  I discussed the limitations, risks, security and privacy concerns of performing an evaluation and management service by telephone and the availability of in person appointments. I also discussed with the patient that there may be a patient responsible charge related to this service. The patient expressed understanding and agreed to proceed.   History and Present Illness:  HPI  Pt calls the office with a rash that started this weekend on her her right abdomen and goes to her back. Only on right side. She reports she is not having any pain. She reports the area is blistering up. Denies any itching.   Review of Systems  Skin: Positive for rash.     Observations/Objective: No SOB or distress noted   Assessment and Plan: 1. Herpes zoster without complication Do not scratch Keep clean and dry Report any pain  Call if symptoms worsen or do not improve  - valACYclovir (VALTREX) 1000 MG tablet; Take 1 tablet (1,000 mg total) by mouth 3 (three) times daily.  Dispense: 21 tablet; Refill: 0     I discussed the assessment and treatment plan with the patient. The patient was provided an opportunity to ask  questions and all were answered. The patient agreed with the plan and demonstrated an understanding of the instructions.   The patient was advised to call back or seek an in-person evaluation if the symptoms worsen or if the condition fails to improve as anticipated.  The above assessment and management plan was discussed with the patient. The patient verbalized understanding of and has agreed to the management plan. Patient is aware to call the clinic if symptoms persist or worsen. Patient is aware when to return to the clinic for a follow-up visit. Patient educated on when it is appropriate to go to the emergency department.   Time call ended:  11:19 AM  I provided 10 minutes of non-face-to-face time during this encounter.    Evelina Dun, FNP

## 2020-08-19 ENCOUNTER — Other Ambulatory Visit: Payer: Self-pay | Admitting: Family Medicine

## 2020-08-22 ENCOUNTER — Telehealth: Payer: Self-pay | Admitting: Family Medicine

## 2020-08-22 NOTE — Telephone Encounter (Signed)
Patient has been diagnosed with shingles in the last month and now has blisters in her mouth. She wants someone to be seen so someone can actually look in her mouth to see if this is what it is.  Appointment scheduled 08/23/2020.

## 2020-08-23 ENCOUNTER — Encounter: Payer: Self-pay | Admitting: Nurse Practitioner

## 2020-08-23 ENCOUNTER — Ambulatory Visit (INDEPENDENT_AMBULATORY_CARE_PROVIDER_SITE_OTHER): Payer: Medicare Other | Admitting: Nurse Practitioner

## 2020-08-23 ENCOUNTER — Other Ambulatory Visit: Payer: Self-pay

## 2020-08-23 VITALS — BP 143/77 | HR 63 | Temp 97.2°F | Resp 20 | Ht 68.0 in | Wt 187.0 lb

## 2020-08-23 DIAGNOSIS — S00522A Blister (nonthermal) of oral cavity, initial encounter: Secondary | ICD-10-CM

## 2020-08-23 MED ORDER — MAGIC MOUTHWASH W/LIDOCAINE: 5.0000 mL | Freq: Four times a day (QID) | ORAL | 0 refills | Status: DC

## 2020-08-23 NOTE — Patient Instructions (Signed)
Oral Ulcers Oral ulcers are small sores inside the mouth or near the mouth. They may occur on or inside the lips, inside the cheeks, on the tongue, or anywhere else inside or near the mouth. They may be called canker sores or cold sores, which are two types of oral ulcers. Many oral ulcers are harmless and go away on their own. In some cases, oral ulcers may require medical care to determine the cause and proper treatment. What are the causes? Common causes of this condition include:  Infections caused by viruses, bacteria, or fungi.  Emotional stress.  Foods or chemicals that irritate the mouth.  Injury or physical irritation of the mouth.  Medicines.  Allergies.  Tobacco use. Less common causes include:  Skin disease.  A type of herpes virus infection (herpes simplexor herpes zoster).  Oral cancer. In some cases, the cause may not be known. What increases the risk? You are more likely to develop this condition if:  You wear dental braces, dentures, or retainers.  You have poor oral hygiene.  You have sensitive skin.  You have a condition that affects the entire body (systemic condition), such as an immune disorder. What are the signs or symptoms? The main symptom of this condition is having one or more oval-shaped or round ulcers that have red borders. Symptoms may vary depending on the cause. This includes:  Location of the ulcers. Ulcers may be found inside the mouth, on the gums, or on the insides of the lips or cheeks. They may also be found on the lips or on skin that is near the mouth, such as the cheeks or chin.  Pain. Ulcers can be painful and uncomfortable, or they can be painless.  Appearance of the ulcers. They may look like red blisters and be filled with fluid, or they may be white or yellow patches.  Frequency of outbreaks. Ulcers may go away permanently after one outbreak, or they may come back (recur) often or rarely. How is this diagnosed? This  condition is diagnosed with a physical exam. Your health care provider may ask you questions about your lifestyle and your medical history. You may have tests, including:  Blood tests.  Removal of a small number of cells from an ulcer to be examined under a microscope (biopsy). How is this treated? Treatment depends on the severity and cause of the condition. Oral ulcers often go away on their own in 1-2 weeks. Treatment may include medicines, such as:  Medicines to treat a viral infection (antivirals), a bacterial infection (antibiotics), or a fungal infection (antifungals).  Medicines to help control pain. This may include: ? Over-the-counter pain medicines. ? Gel, cream, or spray to numb the area (topical anesthetic) if you have severe pain. ? Other medicines to coat or numb your mouth. Follow these instructions at home: Medicines  Take or use over-the-counter and prescription medicines only as told by your health care provider.  If you were prescribed an antibiotic medicine, take it as told by your health care provider. Do not stop taking the antibiotic even if you start to feel better.  Do not use products that contain benzocaine (including numbing gels) to treat teething or mouth pain in children who are younger than 2 years. These products may cause a rare but serious blood condition. Eating and drinking  Eat a balanced diet. Do not eat: ? Spicy foods. ? Citrus, such as oranges. ? Other foods and drinks that you think may cause or irritate your ulcers.  Drink enough fluid to keep your urine pale yellow.  Avoid drinking alcohol. Lifestyle   Practice good oral hygiene: ? Gently brush your teeth with a soft toothbrush two times a day. ? Floss your teeth every day. ? Get regular dental cleanings and checkups.  Do not use any products that contain nicotine or tobacco, such as cigarettes and e-cigarettes. If you need help quitting, ask your health care provider. Managing  pain  If directed, put ice on your face in the affected area to help reduce pain. ? Put ice in a plastic bag. ? Place a towel between your skin and the bag. ? Leave the ice on for 20 minutes, 2-3 times a day.  Avoid physical or chemical irritants that may have caused the ulcers or made them worse, such as mouthwashes that contain alcohol (ethanol). If you wear dental braces, dentures, or retainers, work with your health care provider to make sure these devices are fitted correctly.  If you were prescribed a prescription mouthwash to help reduce pain in your mouth, use it as told by your health care provider. General instructions  Rinse with a salt-water mixture 3-4 times a day or as told by your health care provider. To make a salt-water mixture, completely dissolve -1 tsp (3-6 g) of salt in 1 cup (237 mL) of warm water.  Keep all follow-up visits as told by your health care provider. This is important. Contact a health care provider if:  You have: ? Pain that gets worse or does not get better with medicine. ? Four or more ulcers at one time. ? A fever. ? New ulcers that look or feel different from other ulcers you have. ? Inflammation in one eye or both eyes. ? Ulcers that do not go away after 10 days.  You develop new symptoms in your mouth, such as: ? Bleeding or crusting around your lips or gums. ? Tooth pain. ? Difficulty swallowing.  You develop symptoms on your skin or genitals, such as: ? A rash or blisters. ? Burning or itching sensations.  Your ulcers begin or get worse after you start a new medicine. Get help right away if you have:  Difficulty breathing.  Swelling in your face or neck.  Excessive bleeding from your mouth.  Severe pain. Summary  Oral ulcers may occur anywhere inside or near the mouth.  They can be caused by many things, such as infections, stress, injury or irritation, or tobacco use.  Oral ulcers can be painful or painless.  Treatment  may include medicines to relieve pain or to treat an infection (if appropriate).  Most oral ulcers go away in 1-2 weeks. This information is not intended to replace advice given to you by your health care provider. Make sure you discuss any questions you have with your health care provider. Document Revised: 04/10/2018 Document Reviewed: 04/10/2018 Elsevier Patient Education  [employer].

## 2020-08-23 NOTE — Progress Notes (Signed)
   Subjective:    Patient ID: Melody Casey, female    DOB: 1946/01/20, 74 y.o.   MRN: [id number]   Chief Complaint: Places in mouth   HPI Patient come sin today c/o lesion in her mouth. Noticed it 3-4 days ago. Painful when she talks or eats. She was diagnosed with shingles in a televisit on 08/08/20. Sh ewas given valtrex and motrin for pain. Has started drying up.    Review of Systems  Constitutional: Negative for diaphoresis.  Eyes: Negative for pain.  Respiratory: Negative for shortness of breath.   Cardiovascular: Negative for chest pain, palpitations and leg swelling.  Gastrointestinal: Negative for abdominal pain.  Endocrine: Negative for polydipsia.  Skin: Negative for rash.  Neurological: Negative for dizziness, weakness and headaches.  Hematological: Does not bruise/bleed easily.  All other systems reviewed and are negative.      Objective:   Physical Exam Vitals and nursing note reviewed.  Constitutional:      Appearance: Normal appearance.  Cardiovascular:     Rate and Rhythm: Normal rate and regular rhythm.     Heart sounds: Normal heart sounds.  Skin:    Findings: Rash present.     Comments: Drying up macular rash on right flank  Neurological:     General: No focal deficit present.     Mental Status: She is alert and oriented to person, place, and time.  Psychiatric:        Mood and Affect: Mood normal.        Behavior: Behavior normal.    BP (!) 143/77   Pulse 63   Temp (!) 97.2 F (36.2 C) (Temporal)   Resp 20   Ht 5\' 8"  (1.727 m)   Wt 187 lb (84.8 kg)   SpO2 92%   BMI 28.43 kg/m        Assessment & Plan:  Melody Casey in today with chief complaint of Places in mouth   1. Non-thermal blister of oral cavity, initial encounter Force fluids Avoid salty and spicy foods Meds ordered this encounter  Medications  . magic mouthwash w/lidocaine SOLN    Sig: Take 5 mLs by mouth 4 (four) times daily.    Dispense:  200 mL     Refill:  0    Order Specific Question:   Supervising Provider    Answer:   [name] A [[id number]]       The above assessment and management plan was discussed with the patient. The patient verbalized understanding of and has agreed to the management plan. Patient is aware to call the clinic if symptoms persist or worsen. Patient is aware when to return to the clinic for a follow-up visit. Patient educated on when it is appropriate to go to the emergency department.   Mary-Margaret Hassell Done, FNP

## 2020-09-02 ENCOUNTER — Other Ambulatory Visit: Payer: Self-pay

## 2020-09-02 ENCOUNTER — Encounter: Payer: Self-pay | Admitting: Family Medicine

## 2020-09-02 ENCOUNTER — Ambulatory Visit (INDEPENDENT_AMBULATORY_CARE_PROVIDER_SITE_OTHER): Payer: Medicare Other | Admitting: Family Medicine

## 2020-09-02 VITALS — BP 149/78 | HR 64 | Temp 97.6°F | Ht 68.0 in | Wt 184.6 lb

## 2020-09-02 DIAGNOSIS — I4819 Other persistent atrial fibrillation: Secondary | ICD-10-CM

## 2020-09-02 DIAGNOSIS — E785 Hyperlipidemia, unspecified: Secondary | ICD-10-CM

## 2020-09-02 DIAGNOSIS — E1169 Type 2 diabetes mellitus with other specified complication: Secondary | ICD-10-CM

## 2020-09-02 DIAGNOSIS — Z79899 Other long term (current) drug therapy: Secondary | ICD-10-CM

## 2020-09-02 DIAGNOSIS — I4891 Unspecified atrial fibrillation: Secondary | ICD-10-CM

## 2020-09-02 DIAGNOSIS — I10 Essential (primary) hypertension: Secondary | ICD-10-CM | POA: Diagnosis not present

## 2020-09-02 DIAGNOSIS — E118 Type 2 diabetes mellitus with unspecified complications: Secondary | ICD-10-CM

## 2020-09-02 LAB — COAGUCHEK XS/INR WAIVED
INR: 1.9 — ABNORMAL HIGH (ref 0.9–1.1)
Prothrombin Time: 22.9 s

## 2020-09-02 LAB — BAYER DCA HB A1C WAIVED: HB A1C (BAYER DCA - WAIVED): 6.6 % (ref <7.0)

## 2020-09-02 MED ORDER — GABAPENTIN 100 MG PO CAPS: 100.0000 mg | ORAL_CAPSULE | Freq: Two times a day (BID) | ORAL | 1 refills | Status: DC

## 2020-09-02 NOTE — Progress Notes (Signed)
BP (!) 149/78    Pulse 64    Temp 97.6 F (36.4 C)    Ht 5\' 8"  (1.727 m)    Wt 184 lb 9.6 oz (83.7 kg)    SpO2 93%    BMI 28.07 kg/m    Subjective:   Patient ID: Melody Casey, female    DOB: 1946-03-07, 74 y.o.   MRN: [id number]  HPI: Melody Casey is a 74 y.o. female presenting on 09/02/2020 for Medical Management of Chronic Issues   HPI Coumadin recheck Target goal: 2.0-3.0 Reason on anticoagulation: Paroxysmal A. fib Patient denies any bruising or bleeding or chest pain or palpitations   Type 2 diabetes mellitus Patient comes in today for recheck of his diabetes. Patient has been currently taking no medication has been diet controlled and A1c is 6.6 which is up just slightly but still controlled.. Patient is currently on an ACE inhibitor/ARB. Patient has not seen an ophthalmologist this year. Patient denies any issues with their feet. The symptom started onset as an adult hypertension and hyperlipidemia and A. fib ARE RELATED TO DM   Hypertension Patient is currently on amlodipine and Tikosyn and lisinopril and metoprolol, and their blood pressure today is 149/78. Patient denies any lightheadedness or dizziness. Patient denies headaches, blurred vision, chest pains, shortness of breath, or weakness. Denies any side effects from medication and is content with current medication.   Patient is still fighting rash and pain from her shingles, it is improving but she still has a lot of burning sensation and pain from it.  Relevant past medical, surgical, family and social history reviewed and updated as indicated. Interim medical history since our last visit reviewed. Allergies and medications reviewed and updated.  Review of Systems  Constitutional: Negative for chills and fever.  Eyes: Negative for visual disturbance.  Respiratory: Negative for chest tightness and shortness of breath.   Cardiovascular: Negative for chest pain and leg swelling.  Skin: Negative for  rash.  Neurological: Negative for light-headedness and headaches.  Psychiatric/Behavioral: Negative for agitation and behavioral problems.  All other systems reviewed and are negative.   Per HPI unless specifically indicated above   Allergies as of 09/02/2020      Reactions   Miralax [polyethylene Glycol] Swelling, Rash   Took [facility] brand developed rash. Patient states she tolerated name brand MiraLax in the past. 09/01/15. Patient states she had diffuse swelling including swelling of her lips.    Vancomycin Rash, Shortness Of Breath   Acetaminophen Hives   Oxycodone-acetaminophen Itching   Sulfa Antibiotics Rash   All over rash   Sulfacetamide Sodium Rash   All over rash   Sulfasalazine Rash   All over rash All over rash   Banana Other (See Comments)   Unknown   Latex Rash   Tape Rash      Medication List       Accurate as of September 02, 2020  1:26 PM. If you have any questions, ask your nurse or doctor.        ALPRAZolam 0.25 MG tablet Commonly known as: XANAX Take 1 tablet (0.25 mg total) by mouth 3 (three) times daily as needed for anxiety.   amLODipine 5 MG tablet Commonly known as: NORVASC Take 1 tablet (5 mg total) by mouth daily.   atorvastatin 40 MG tablet Commonly known as: LIPITOR TAKE 1 AND 1/2 TABLETS BY MOUTH EVERY DAY   dofetilide 250 MCG capsule Commonly known as: TIKOSYN Take 1 capsule (250 mcg  total) by mouth 2 (two) times daily.   hydroxychloroquine 200 MG tablet Commonly known as: PLAQUENIL Take 1 tablet (200 mg total) by mouth 2 (two) times daily.   leflunomide 20 MG tablet Commonly known as: ARAVA Take 20 mg by mouth daily.   lisinopril 20 MG tablet Commonly known as: ZESTRIL TAKE 1 TABLET(20 MG) BY MOUTH DAILY   magic mouthwash w/lidocaine Soln Take 5 mLs by mouth 4 (four) times daily.   magnesium oxide 400 MG tablet Commonly known as: MAG-OX Take 400 mg by mouth 2 (two) times daily.   metoprolol succinate 25 MG 24 hr  tablet Commonly known as: TOPROL-XL Take 1 tablet (25 mg total) by mouth daily.   potassium chloride 10 MEQ tablet Commonly known as: KLOR-CON TAKE 1 TABLET BY MOUTH DAILY   warfarin 5 MG tablet Commonly known as: COUMADIN Take as directed by the anticoagulation clinic. If you are unsure how to take this medication, talk to your nurse or doctor. Original instructions: TAKE 1 AND 1/2 TABLETS(7.5 MG) BY MOUTH DAILY        Objective:   BP (!) 149/78    Pulse 64    Temp 97.6 F (36.4 C)    Ht 5\' 8"  (1.727 m)    Wt 184 lb 9.6 oz (83.7 kg)    SpO2 93%    BMI 28.07 kg/m   Wt Readings from Last 3 Encounters:  09/02/20 184 lb 9.6 oz (83.7 kg)  08/23/20 187 lb (84.8 kg)  07/18/20 192 lb (87.1 kg)    Physical Exam Vitals and nursing note reviewed.  Constitutional:      General: She is not in acute distress.    Appearance: She is well-developed. She is not diaphoretic.  Eyes:     Conjunctiva/sclera: Conjunctivae normal.  Cardiovascular:     Rate and Rhythm: Normal rate. Rhythm irregular.     Heart sounds: Normal heart sounds. No murmur heard.   Pulmonary:     Effort: Pulmonary effort is normal. No respiratory distress.     Breath sounds: Normal breath sounds. No wheezing.  Musculoskeletal:        General: No tenderness. Normal range of motion.  Skin:    General: Skin is warm and dry.     Findings: Rash present.  Neurological:     Mental Status: She is alert and oriented to person, place, and time.     Coordination: Coordination normal.  Psychiatric:        Behavior: Behavior normal.       Assessment & Plan:   Problem List Items Addressed This Visit      Cardiovascular and Mediastinum   Essential hypertension   Persistent atrial fibrillation (H[facility])     Endocrine   Type 2 diabetes mellitus with complication, without long-term current use of insulin ([facility])     Other   Dyslipidemia, goal LDL below 70   High risk medication use    Other Visit Diagnoses    Atrial  fibrillation, unspecified type ([place])    -  Primary   Relevant Orders   CoaguChek XS/INR Waived (Completed)   Type 2 diabetes mellitus with other specified complication, without long-term current use of insulin ([place])       Relevant Orders   Bayer DCA Hb A1c Waived      A1c 6.6 looks good, continue current medication, no change.  Description   Reduce dose to take warfarin 7.5 mg on Monday, and Friday only and 5 mg all other  days.   INR 1.9 today. Goal 2.0-3.0  Recheck in 4-6 weeks     Follow up plan: Return if symptoms worsen or fail to improve, for 6 to 8-week INR recheck.  Counseling provided for all of the vaccine components Orders Placed This Encounter  Procedures   [facility] Hb A1c Waived   CoaguChek XS/INR Carrabelle Yamilette Garretson, MD [facility] 09/02/2020, 1:26 PM

## 2020-09-02 NOTE — Addendum Note (Signed)
Addended by: [name] on: 09/02/2020 02:00 PM   Modules accepted: Orders

## 2020-09-08 ENCOUNTER — Telehealth: Payer: Self-pay | Admitting: Family Medicine

## 2020-09-08 NOTE — Telephone Encounter (Signed)
Yes you can go ahead and double the dose and just watch out for any side effects such as mental slowing.

## 2020-09-08 NOTE — Telephone Encounter (Signed)
PATIENT AWARE

## 2020-09-27 ENCOUNTER — Other Ambulatory Visit: Payer: Self-pay

## 2020-09-27 ENCOUNTER — Encounter: Payer: Self-pay | Admitting: Nurse Practitioner

## 2020-09-27 ENCOUNTER — Ambulatory Visit (INDEPENDENT_AMBULATORY_CARE_PROVIDER_SITE_OTHER): Payer: Medicare Other | Admitting: Nurse Practitioner

## 2020-09-27 VITALS — BP 128/73 | HR 68 | Temp 97.8°F | Resp 20 | Ht 68.0 in | Wt 185.0 lb

## 2020-09-27 DIAGNOSIS — I10 Essential (primary) hypertension: Secondary | ICD-10-CM | POA: Diagnosis not present

## 2020-09-27 DIAGNOSIS — M792 Neuralgia and neuritis, unspecified: Secondary | ICD-10-CM | POA: Diagnosis not present

## 2020-09-27 DIAGNOSIS — B029 Zoster without complications: Secondary | ICD-10-CM | POA: Diagnosis not present

## 2020-09-27 MED ORDER — GABAPENTIN 300 MG PO CAPS
300.0000 mg | ORAL_CAPSULE | Freq: Two times a day (BID) | ORAL | 2 refills | Status: DC
Start: 2020-09-27 — End: 2021-01-18

## 2020-09-27 NOTE — Assessment & Plan Note (Signed)
Patient is reporting none therapeutic effect of 100 mg gabapentin twice daily.  Patient was treated for herpes zoster few weeks ago.  Patient reports taking spouses 300 mg gabapentin  and had therapeutic effect.  Re-evaluated patient, provided education with printed handouts given.  Started patient on 300 mg gabapentin twice daily.  Rx sent to pharmacy.  Follow-up with worsening or unresolved symptoms.

## 2020-09-27 NOTE — Assessment & Plan Note (Signed)
Gabapentin dose changed to 300 mg twice daily. Rx sent to pharmacy. Follow-up with unresolved or worsening symptoms.

## 2020-09-27 NOTE — Assessment & Plan Note (Signed)
Essential hypertension well managed on current medication no changes to medication dose necessary.  Advised patient to continue with low-sodium diet and exercise modification.  Follow-up as directed.

## 2020-09-27 NOTE — Progress Notes (Signed)
Established Patient Office Visit  Subjective:  Patient ID: Melody Casey, female    DOB: [birth year]  Age: 74 y.o. MRN: [id number]  CC:  Chief Complaint  Patient presents with  . Herpes Zoster    discuss med change     HPI Melody Casey presents for herpes zoster follow-up.  Patient is reporting uncontrolled pain right side upper chest area.  Currently taking gabapentin 100 mg twice daily.  Patient was seen for shingles last month and treated.  All shingle blister healed and crusted over.  Patient continues to use calamine lotion and aloe over skin area but will like to make changes to gabapentin.  Patient describes pain as aching.  Denies fever, burning, and headache  Past Medical History:  Diagnosis Date  . AAA (abdominal aortic aneurysm) ([place])   . Acute diverticulitis   . Anxiety   . Arthritis   . Atrial fibrillation ([place])    a. Dx 03/2011 - on tikosyn/coumadin.  Marland Kitchen CAD (coronary artery disease)    a. NSTEMI 02/2011: occ mid Cx, DES to OM2, residual nonobst LAD dz.  . Cancer ([place])    breast  . Diabetes mellitus   . Diverticulitis   . Diverticulitis of colon     2008, 04/2011, 12/2014, 08/2015  . 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 Hemangioma    liver  . HLD (hyperlipidemia)   . HTN (hypertension)   . Osteoporosis   . Tobacco abuse    stopped smoking 2012  . Ureteral obstruction    History of gross hematuria/right hydronephrosis 2/2 to uteropelvic junction obstruction, s/p cystoscopy in January 2007 with bilateral retrograde pyelography, right ureter arthroscopy, right ureteral stent placement, bladder biopsies, stent removal since then.  . Wears dentures    top    Past Surgical History:  Procedure Laterality Date  . BIOPSY N/A 11/01/2015   Procedure: BIOPSY;  Surgeon: Danie Binder, MD;  Location: [facility];  Service: Endoscopy;  Laterality: N/A;  . BREAST LUMPECTOMY WITH NEEDLE LOCALIZATION AND AXILLARY SENTINEL LYMPH NODE BX Right 03/01/2014    Procedure: BREAST LUMPECTOMY WITH NEEDLE LOCALIZATION AND AXILLARY SENTINEL LYMPH NODE BX;  Surgeon: Edward Jolly, MD;  Location: [facility];  Service: General;  Laterality: Right;  . BREAST SURGERY    . CARDIAC CATHETERIZATION  2012   stent  . COLON RESECTION N/A 07/25/2018   Procedure: COLOSTOMY;  Surgeon: Rolm Bookbinder, MD;  Location: [facility];  Service: General;  Laterality: N/A;  . COLONOSCOPY WITH PROPOFOL N/A 06/29/2014   Dr. Wynetta Emery: universal diverticulosis  . CORONARY STENT PLACEMENT  2012  . ESOPHAGOGASTRODUODENOSCOPY (EGD) WITH PROPOFOL N/A 11/01/2015   Procedure: ESOPHAGOGASTRODUODENOSCOPY (EGD) WITH PROPOFOL;  Surgeon: Danie Binder, MD;  Location: [facility];  Service: Endoscopy;  Laterality: N/A;  . KNEE ARTHROSCOPY     left  . LAPAROTOMY N/A 07/25/2018   Procedure: EXPLORATORY LAPAROTOMY;  Surgeon: Rolm Bookbinder, MD;  Location: [facility];  Service: General;  Laterality: N/A;  . PARTIAL COLECTOMY N/A 07/25/2018   Procedure: COLECTOMY;  Surgeon: Rolm Bookbinder, MD;  Location: [facility];  Service: General;  Laterality: N/A;  . Right leg surgery  age 68   As a child for  ?scleroderma per patient  . TONSILLECTOMY AND ADENOIDECTOMY    . TUBAL LIGATION    . Ureteral surgery  2011   rt ureterostomy-stent    Family History  Problem Relation Age of Onset  . Lung cancer Father 72  deceased  . Cancer Father   . Heart failure Mother 47       deceased  . Heart disease Mother   . Diabetes Brother   . Hypertension Brother   . Cancer Brother        prostate  . Colon cancer Neg Hx   . Liver disease Neg Hx     Social History   Socioeconomic History  . Marital status: Married    Spouse name: Not on file  . Number of children: 2  . Years of education: Not on file  . Highest education level: Not on file  Occupational History  . 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 Smoking status: Former Smoker    Packs/day: 2.00    Years: 50.00     Pack years: 100.00    Types: Cigarettes    Quit date: 02/08/2011    Years since quitting: 9.6  . Smokeless tobacco: Never Used  Vaping Use  . Vaping Use: Never used  Substance and Sexual Activity  . Alcohol use: No    Alcohol/week: 0.0 standard drinks  . Drug use: No  . Sexual activity: Never  Other Topics Concern  . Not on file  Social History Narrative   Takes care 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    Social Determinants of Health   Financial Resource Strain:   . Difficulty of Paying Living Expenses: Not on file  Food Insecurity:   . Worried About Charity fundraiser in the Last Year: Not on file  . Ran Out of Food in the Last Year: Not on file  Transportation Needs:   . Lack of Transportation (Medical): Not on file  . Lack of Transportation (Non-Medical): Not on file  Physical Activity:   . Days of Exercise per Week: Not on file  . Minutes of Exercise per Session: Not on file  Stress:   . Feeling of Stress : Not on file  Social Connections:   . Frequency of Communication with Friends and Family: Not on file  . Frequency of Social Gatherings with Friends and Family: Not on file  . Attends Religious Services: Not on file  . Active Member of Clubs or Organizations: Not on file  . Attends Archivist Meetings: Not on file  . Marital Status: Not on file  Intimate Partner Violence:   . Fear of Current or Ex-Partner: Not on file  . Emotionally Abused: Not on file  . Physically Abused: Not on file  . Sexually Abused: Not on file    Outpatient Medications Prior to Visit  Medication Sig Dispense Refill  . ALPRAZolam (XANAX) 0.25 MG tablet Take 1 tablet (0.25 mg total) by mouth 3 (three) times daily as needed for anxiety. 30 tablet 2  . amLODipine (NORVASC) 5 MG tablet Take 1 tablet (5 mg total) by mouth daily. 90 tablet 3  . atorvastatin (LIPITOR) 40 MG tablet TAKE 1 AND 1/2 TABLETS BY MOUTH EVERY DAY 45 tablet 3  . dofetilide (TIKOSYN) 250 MCG capsule Take 1  capsule (250 mcg total) by mouth 2 (two) times daily. 180 capsule 3  . hydroxychloroquine (PLAQUENIL) 200 MG tablet Take 1 tablet (200 mg total) by mouth 2 (two) times daily. 60 tablet 0  . leflunomide (ARAVA) 20 MG tablet Take 20 mg by mouth daily.    Marland Kitchen lisinopril (ZESTRIL) 20 MG tablet TAKE 1 TABLET(20 MG) BY MOUTH DAILY 90 tablet 0  . magic mouthwash w/lidocaine SOLN Take 5 mLs  by mouth 4 (four) times daily. 200 mL 0  . magnesium oxide (MAG-OX) 400 MG tablet Take 400 mg by mouth 2 (two) times daily.    . metoprolol succinate (TOPROL-XL) 25 MG 24 hr tablet Take 1 tablet (25 mg total) by mouth daily. 90 tablet 3  . potassium chloride (KLOR-CON) 10 MEQ tablet TAKE 1 TABLET BY MOUTH DAILY 240 tablet 2  . warfarin (COUMADIN) 5 MG tablet TAKE 1 AND 1/2 TABLETS(7.5 MG) BY MOUTH DAILY 135 tablet 0  . gabapentin (NEURONTIN) 100 MG capsule Take 1 capsule (100 mg total) by mouth 2 (two) times daily. 60 capsule 1   No facility-administered medications prior to visit.    Allergies  Allergen Reactions  . Miralax [Polyethylene Glycol] Swelling and Rash    Took [facility] brand developed rash. Patient states she tolerated name brand MiraLax in the past.  09/01/15. Patient states she had diffuse swelling including swelling of her lips.   . Vancomycin Rash and Shortness Of Breath  . Acetaminophen Hives  . Oxycodone-Acetaminophen Itching  . Sulfa Antibiotics Rash    All over rash  . Sulfacetamide Sodium Rash    All over rash  . Sulfasalazine Rash    All over rash All over rash  . Banana Other (See Comments)    Unknown  . Latex Rash  . Tape Rash    ROS Review of Systems  Psychiatric/Behavioral:       Nerve pain  All other systems reviewed and are negative.     Objective:    Physical Exam Vitals reviewed.  Constitutional:      Appearance: Normal appearance.  HENT:     Head: Normocephalic.     Nose: Nose normal.  Cardiovascular:     Rate and Rhythm: Normal rate and regular rhythm.      Pulses: Normal pulses.     Heart sounds: Normal heart sounds.  Pulmonary:     Effort: Pulmonary effort is normal.     Breath sounds: Normal breath sounds.  Abdominal:     General: Bowel sounds are normal.  Musculoskeletal:        General: Tenderness present.  Skin:    General: Skin is warm.     Findings: Erythema present.  Neurological:     Mental Status: She is alert and oriented to person, place, and time.     Comments: Nerve pain  Psychiatric:        Mood and Affect: Mood normal.        Behavior: Behavior normal.     BP 128/73   Pulse 68   Temp 97.8 F (36.6 C)   Resp 20   Ht _0  (1.727 m)   Wt 185 lb (83.9 kg)   SpO2 95%   BMI 28.13 kg/m  Wt Readings from Last 3 Encounters:  09/27/20 185 lb (83.9 kg)  09/02/20 184 lb 9.6 oz (83.7 kg)  08/23/20 187 lb (84.8 kg)     Health Maintenance Due  Topic Date Due  . PNA vac Low Risk Adult (2 of 2 - PCV13) 01/24/2011  . INFLUENZA VACCINE  07/10/2020     Lab Results  Component Value Date   TSH 2.790 05/23/2020   Lab Results  Component Value Date   WBC 5.6 06/23/2020   HGB 12.9 06/23/2020   HCT 39.7 06/23/2020   MCV 90.2 06/23/2020   PLT 189 06/23/2020   Lab Results  Component Value Date   NA 140 06/23/2020   K 4.0 06/23/2020  CHLORIDE 105 06/06/2017   CO2 24 06/23/2020   GLUCOSE 162 (H) 06/23/2020   BUN 16 06/23/2020   CREATININE 0.82 06/23/2020   BILITOT 0.4 06/23/2020   ALKPHOS 115 06/23/2020   AST 16 06/23/2020   ALT 13 06/23/2020   PROT 7.8 06/23/2020   ALBUMIN 3.5 06/23/2020   CALCIUM 10.9 (H) 06/23/2020   ANIONGAP 9 06/23/2020   EGFR 58 (L) 06/06/2017   GFR 84.98 08/02/2014   Lab Results  Component Value Date   CHOL 127 05/23/2020   Lab Results  Component Value Date   HDL 44 05/23/2020   Lab Results  Component Value Date   LDLCALC 66 05/23/2020   Lab Results  Component Value Date   TRIG 90 05/23/2020   Lab Results  Component Value Date   CHOLHDL 2.9 05/23/2020   Lab  Results  Component Value Date   HGBA1C 6.6 09/02/2020      Assessment & Plan:   Problem List Items Addressed This Visit      Cardiovascular and Mediastinum   Essential hypertension    Essential hypertension well managed on current medication no changes to medication dose necessary.  Advised patient to continue with low-sodium diet and exercise modification.  Follow-up as directed.        Other   Nerve pain - Primary    Gabapentin dose changed to 300 mg twice daily. Rx sent to pharmacy. Follow-up with unresolved or worsening symptoms.      Herpes zoster without complication    Patient is reporting none therapeutic effect of 100 mg gabapentin twice daily.  Patient was treated for herpes zoster few weeks ago.  Patient reports taking spouses 300 mg gabapentin  and had therapeutic effect.  Re-evaluated patient, provided education with printed handouts given.  Started patient on 300 mg gabapentin twice daily.  Rx sent to pharmacy.  Follow-up with worsening or unresolved symptoms.         Meds ordered this encounter  Medications  . gabapentin (NEURONTIN) 300 MG capsule    Sig: Take 1 capsule (300 mg total) by mouth 2 (two) times daily.    Dispense:  60 capsule    Refill:  2    Order Specific Question:   Supervising Provider    Answer:   [name] A [[id number]]    Follow-up: Return if symptoms worsen or fail to improve.    Ivy Lynn, NP

## 2020-09-27 NOTE — Patient Instructions (Signed)
Shingles  Shingles is an infection. It gives you a painful skin rash and blisters that have fluid in them. Shingles is caused by the same germ (virus) that causes chickenpox. Shingles only happens in people who:  Have had chickenpox.  Have been given a shot of medicine (vaccine) to protect against chickenpox. Shingles is rare in this group. The first symptoms of shingles may be itching, tingling, or pain in an area on your skin. A rash will show on your skin a few days or weeks later. The rash is likely to be on one side of your body. The rash usually has a shape like a belt or a band. Over time, the rash turns into fluid-filled blisters. The blisters will break open, change into scabs, and dry up. Medicines may:  Help with pain and itching.  Help you get better sooner.  Help to prevent long-term problems. Follow these instructions at home: Medicines  Take over-the-counter and prescription medicines only as told by your doctor.  Put on an anti-itch cream or numbing cream where you have a rash, blisters, or scabs. Do this as told by your doctor. Helping with itching and discomfort   Put cold, wet cloths (cold compresses) on the area of the rash or blisters as told by your doctor.  Cool baths can help you feel better. Try adding baking soda or dry oatmeal to the water to lessen itching. Do not bathe in hot water. Blister and rash care  Keep your rash covered with a loose bandage (dressing).  Wear loose clothing that does not rub on your rash.  Keep your rash and blisters clean. To do this, wash the area with mild soap and cool water as told by your doctor.  Check your rash every day for signs of infection. Check for: ? More redness, swelling, or pain. ? Fluid or blood. ? Warmth. ? Pus or a bad smell.  Do not scratch your rash. Do not pick at your blisters. To help you to not scratch: ? Keep your fingernails clean and cut short. ? Wear gloves or mittens when you sleep, if  scratching is a problem. General instructions  Rest as told by your doctor.  Keep all follow-up visits as told by your doctor. This is important.  Wash your hands often with soap and water. If soap and water are not available, use hand sanitizer. Doing this lowers your chance of getting a skin infection caused by germs (bacteria).  Your infection can cause chickenpox in people who have never had chickenpox or never got a shot of chickenpox vaccine. If you have blisters that did not change into scabs yet, try not to touch other people or be around other people, especially: ? Babies. ? Pregnant women. ? Children who have areas of red, itchy, or rough skin (eczema). ? Very old people who have transplants. ? People who have a long-term (chronic) sickness, like cancer or AIDS. Contact a doctor if:  Your pain does not get better with medicine.  Your pain does not get better after the rash heals.  You have any signs of infection in the rash area. These signs include: ? More redness, swelling, or pain around the rash. ? Fluid or blood coming from the rash. ? The rash area feeling warm to the touch. ? Pus or a bad smell coming from the rash. Get help right away if:  The rash is on your face or nose.  You have pain in your face or pain by   your eye.  You lose feeling on one side of your face.  You have trouble seeing.  You have ear pain, or you have ringing in your ear.  You have a loss of taste.  Your condition gets worse. Summary  Shingles gives you a painful skin rash and blisters that have fluid in them.  Shingles is an infection. It is caused by the same germ (virus) that causes chickenpox.  Keep your rash covered with a loose bandage (dressing). Wear loose clothing that does not rub on your rash.  If you have blisters that did not change into scabs yet, try not to touch other people or be around people. This information is not intended to replace advice given to you by  your health care provider. Make sure you discuss any questions you have with your health care provider. Document Revised: 03/20/2019 Document Reviewed: 07/31/2017 Elsevier Patient Education  2020 Elsevier Inc.  

## 2020-10-17 ENCOUNTER — Ambulatory Visit (INDEPENDENT_AMBULATORY_CARE_PROVIDER_SITE_OTHER): Payer: Medicare Other | Admitting: Family Medicine

## 2020-10-17 ENCOUNTER — Encounter: Payer: Self-pay | Admitting: Family Medicine

## 2020-10-17 ENCOUNTER — Other Ambulatory Visit: Payer: Self-pay

## 2020-10-17 VITALS — BP 127/80 | HR 65 | Temp 98.0°F | Ht 68.0 in | Wt 185.0 lb

## 2020-10-17 DIAGNOSIS — I10 Essential (primary) hypertension: Secondary | ICD-10-CM

## 2020-10-17 DIAGNOSIS — I4891 Unspecified atrial fibrillation: Secondary | ICD-10-CM | POA: Diagnosis not present

## 2020-10-17 DIAGNOSIS — Z79899 Other long term (current) drug therapy: Secondary | ICD-10-CM

## 2020-10-17 DIAGNOSIS — E118 Type 2 diabetes mellitus with unspecified complications: Secondary | ICD-10-CM

## 2020-10-17 DIAGNOSIS — Z23 Encounter for immunization: Secondary | ICD-10-CM | POA: Diagnosis not present

## 2020-10-17 DIAGNOSIS — Z0289 Encounter for other administrative examinations: Secondary | ICD-10-CM | POA: Diagnosis not present

## 2020-10-17 DIAGNOSIS — I4819 Other persistent atrial fibrillation: Secondary | ICD-10-CM | POA: Diagnosis not present

## 2020-10-17 DIAGNOSIS — E785 Hyperlipidemia, unspecified: Secondary | ICD-10-CM

## 2020-10-17 DIAGNOSIS — Z79891 Long term (current) use of opiate analgesic: Secondary | ICD-10-CM | POA: Diagnosis not present

## 2020-10-17 LAB — COAGUCHEK XS/INR WAIVED
INR: 3.8 — ABNORMAL HIGH (ref 0.9–1.1)
Prothrombin Time: 45.4 s

## 2020-10-17 MED ORDER — LISINOPRIL 20 MG PO TABS
20.0000 mg | ORAL_TABLET | Freq: Every day | ORAL | 3 refills | Status: DC
Start: 2020-10-17 — End: 2021-10-04

## 2020-10-17 MED ORDER — WARFARIN SODIUM 5 MG PO TABS
5.0000 mg | ORAL_TABLET | Freq: Every day | ORAL | 3 refills | Status: DC
Start: 2020-10-17 — End: 2021-01-25

## 2020-10-17 NOTE — Progress Notes (Signed)
BP 127/80   Pulse 65   Temp 98 F (36.7 C)   Ht 5\' 8"  (1.727 m)   Wt 185 lb (83.9 kg)   BMI 28.13 kg/m    Subjective:   Patient ID: Melody Casey, female    DOB: 01-15-1946, 74 y.o.   MRN: [id number]  HPI: Melody Casey is a 74 y.o. female presenting on 10/17/2020 for Medical Management of Chronic Issues, Atrial Fibrillation, and Diabetes   HPI Coumadin recheck Target goal: 2.0-3.0 Reason on anticoagulation: Paroxysmal A. fib Patient denies any bruising or bleeding or chest pain or palpitations   Type 2 diabetes mellitus Patient comes in today for recheck of his diabetes. Patient has been currently taking no medication and has been diet controlled.  Will check A1c today.. Patient is currently on an ACE inhibitor/ARB. Patient has seen an ophthalmologist this year. Patient denies any issues with their feet. The symptom started onset as an adult hypertension hyperlipidemia and A. fib ARE RELATED TO DM   Hypertension Patient is currently on lisinopril and metoprolol and amlodipine, and their blood pressure today is 127/80. Patient denies any lightheadedness or dizziness. Patient denies headaches, blurred vision, chest pains, shortness of breath, or weakness. Denies any side effects from medication and is content with current medication.   Hyperlipidemia Patient is coming in for recheck of his hyperlipidemia. The patient is currently taking atorvastatin. They deny any issues with myalgias or history of liver damage from it. They deny any focal numbness or weakness or chest pain.   Relevant past medical, surgical, family and social history reviewed and updated as indicated. Interim medical history since our last visit reviewed. Allergies and medications reviewed and updated.  Review of Systems  Constitutional: Negative for chills and fever.  Eyes: Negative for visual disturbance.  Respiratory: Negative for chest tightness and shortness of breath.   Cardiovascular:  Negative for chest pain and leg swelling.  Musculoskeletal: Negative for back pain and gait problem.  Skin: Negative for rash.  Neurological: Negative for light-headedness and headaches.  Psychiatric/Behavioral: Negative for agitation and behavioral problems.  All other systems reviewed and are negative.   Per HPI unless specifically indicated above   Allergies as of 10/17/2020      Reactions   Miralax [polyethylene Glycol] Swelling, Rash   Took [facility] brand developed rash. Patient states she tolerated name brand MiraLax in the past. 09/01/15. Patient states she had diffuse swelling including swelling of her lips.    Vancomycin Rash, Shortness Of Breath   Acetaminophen Hives   Oxycodone-acetaminophen Itching   Sulfa Antibiotics Rash   All over rash   Sulfacetamide Sodium Rash   All over rash   Sulfasalazine Rash   All over rash All over rash   Banana Other (See Comments)   Unknown   Latex Rash   Tape Rash      Medication List       Accurate as of October 17, 2020  1:58 PM. If you have any questions, ask your nurse or doctor.        STOP taking these medications   magic mouthwash w/lidocaine Soln Stopped by: [name] Madyson Lukach, MD     TAKE these medications   ALPRAZolam 0.25 MG tablet Commonly known as: XANAX Take 1 tablet (0.25 mg total) by mouth 3 (three) times daily as needed for anxiety.   amLODipine 5 MG tablet Commonly known as: NORVASC Take 1 tablet (5 mg total) by mouth daily.   atorvastatin 40 MG  tablet Commonly known as: LIPITOR TAKE 1 AND 1/2 TABLETS BY MOUTH EVERY DAY   dofetilide 250 MCG capsule Commonly known as: TIKOSYN Take 1 capsule (250 mcg total) by mouth 2 (two) times daily.   gabapentin 300 MG capsule Commonly known as: NEURONTIN Take 1 capsule (300 mg total) by mouth 2 (two) times daily.   hydroxychloroquine 200 MG tablet Commonly known as: PLAQUENIL Take 1 tablet (200 mg total) by mouth 2 (two) times daily.   leflunomide 20 MG  tablet Commonly known as: ARAVA Take 20 mg by mouth daily.   lisinopril 20 MG tablet Commonly known as: ZESTRIL TAKE 1 TABLET(20 MG) BY MOUTH DAILY   magnesium oxide 400 MG tablet Commonly known as: MAG-OX Take 400 mg by mouth 2 (two) times daily.   metoprolol succinate 25 MG 24 hr tablet Commonly known as: TOPROL-XL Take 1 tablet (25 mg total) by mouth daily.   potassium chloride 10 MEQ tablet Commonly known as: KLOR-CON TAKE 1 TABLET BY MOUTH DAILY   warfarin 5 MG tablet Commonly known as: COUMADIN Take as directed by the anticoagulation clinic. If you are unsure how to take this medication, talk to your nurse or doctor. Original instructions: TAKE 1 AND 1/2 TABLETS(7.5 MG) BY MOUTH DAILY        Objective:   BP 127/80   Pulse 65   Temp 98 F (36.7 C)   Ht 5\' 8"  (1.727 m)   Wt 185 lb (83.9 kg)   BMI 28.13 kg/m   Wt Readings from Last 3 Encounters:  10/17/20 185 lb (83.9 kg)  09/27/20 185 lb (83.9 kg)  09/02/20 184 lb 9.6 oz (83.7 kg)    Physical Exam Vitals and nursing note reviewed.  Constitutional:      General: She is not in acute distress.    Appearance: She is well-developed. She is not diaphoretic.  Eyes:     Conjunctiva/sclera: Conjunctivae normal.  Cardiovascular:     Rate and Rhythm: Normal rate and regular rhythm.     Heart sounds: Normal heart sounds. No murmur heard.   Pulmonary:     Effort: Pulmonary effort is normal. No respiratory distress.     Breath sounds: Normal breath sounds. No wheezing.  Musculoskeletal:        General: No tenderness. Normal range of motion.  Skin:    General: Skin is warm and dry.     Findings: No rash.  Neurological:     Mental Status: She is alert and oriented to person, place, and time.     Coordination: Coordination normal.  Psychiatric:        Behavior: Behavior normal.    Description   Hold today, Reduce dose to take warfarin 7.5 mg on Monday only and 5 mg all other days.   INR 3.8 today. Goal  2.0-3.0  Recheck in 4-6 weeks       Assessment & Plan:   Problem List Items Addressed This Visit      Cardiovascular and Mediastinum   Essential hypertension   Relevant Medications   lisinopril (ZESTRIL) 20 MG tablet   warfarin (COUMADIN) 5 MG tablet   Persistent atrial fibrillation ([facility])   Relevant Medications   lisinopril (ZESTRIL) 20 MG tablet   warfarin (COUMADIN) 5 MG tablet     Endocrine   Type 2 diabetes mellitus with complication, without long-term current use of insulin ([facility])   Relevant Medications   lisinopril (ZESTRIL) 20 MG tablet   Other Relevant Orders   Bayer  DCA Hb A1c Waived     Other   Dyslipidemia, goal LDL below 70   Relevant Medications   lisinopril (ZESTRIL) 20 MG tablet   warfarin (COUMADIN) 5 MG tablet   High risk medication use    Other Visit Diagnoses    Atrial fibrillation, unspecified type ([place])    -  Primary   Relevant Medications   lisinopril (ZESTRIL) 20 MG tablet   warfarin (COUMADIN) 5 MG tablet   Other Relevant Orders   CoaguChek XS/INR Waived (Completed)   Need for vaccination against Streptococcus pneumoniae using pneumococcal conjugate vaccine 13       Relevant Orders   Pneumococcal conjugate vaccine 13-valent (Completed)   Need for immunization against influenza       Relevant Orders   Flu Vaccine QUAD High Dose(Fluad) (Completed)   Medication management contract agreement       Relevant Orders   ToxASSURE Select 13 (MW), Urine (Completed)      Patient still takes gabapentin to help with nerve pain from shingles under her right breast.  Will check A1c today. Follow up plan: Return in about 4 weeks (around 11/14/2020), or if symptoms worsen or fail to improve, for INR recheck.  Counseling provided for all of the vaccine components Orders Placed This Encounter  Procedures  . CoaguChek XS/INR [place], MD [facility] 10/17/2020, 1:58 PM

## 2020-10-19 LAB — TOXASSURE SELECT 13 (MW), URINE

## 2020-10-23 ENCOUNTER — Other Ambulatory Visit: Payer: Self-pay | Admitting: Family Medicine

## 2020-11-04 DIAGNOSIS — Z23 Encounter for immunization: Secondary | ICD-10-CM | POA: Diagnosis not present

## 2020-11-06 ENCOUNTER — Other Ambulatory Visit: Payer: Self-pay | Admitting: Cardiology

## 2020-11-17 DIAGNOSIS — L84 Corns and callosities: Secondary | ICD-10-CM | POA: Diagnosis not present

## 2020-11-17 DIAGNOSIS — I70203 Unspecified atherosclerosis of native arteries of extremities, bilateral legs: Secondary | ICD-10-CM | POA: Diagnosis not present

## 2020-11-17 DIAGNOSIS — B351 Tinea unguium: Secondary | ICD-10-CM | POA: Diagnosis not present

## 2020-11-17 DIAGNOSIS — M79676 Pain in unspecified toe(s): Secondary | ICD-10-CM | POA: Diagnosis not present

## 2020-11-24 ENCOUNTER — Ambulatory Visit (INDEPENDENT_AMBULATORY_CARE_PROVIDER_SITE_OTHER): Payer: Medicare Other | Admitting: Family Medicine

## 2020-11-24 ENCOUNTER — Other Ambulatory Visit: Payer: Self-pay

## 2020-11-24 ENCOUNTER — Encounter: Payer: Self-pay | Admitting: Family Medicine

## 2020-11-24 VITALS — BP 147/69 | HR 60 | Temp 97.0°F | Ht 68.0 in | Wt 186.0 lb

## 2020-11-24 DIAGNOSIS — Z79899 Other long term (current) drug therapy: Secondary | ICD-10-CM

## 2020-11-24 DIAGNOSIS — I4891 Unspecified atrial fibrillation: Secondary | ICD-10-CM | POA: Diagnosis not present

## 2020-11-24 DIAGNOSIS — Z23 Encounter for immunization: Secondary | ICD-10-CM

## 2020-11-24 DIAGNOSIS — I4819 Other persistent atrial fibrillation: Secondary | ICD-10-CM

## 2020-11-24 LAB — COAGUCHEK XS/INR WAIVED
INR: 2.2 — ABNORMAL HIGH (ref 0.9–1.1)
Prothrombin Time: 26.7 s

## 2020-11-24 NOTE — Addendum Note (Signed)
Addended by: Alphonzo Dublin on: 11/24/2020 02:32 PM   Modules accepted: Orders

## 2020-11-24 NOTE — Progress Notes (Signed)
BP (!) 147/69   Pulse 60   Temp (!) 97 F (36.1 C)   Ht 5\' 8"  (1.727 m)   Wt 186 lb (84.4 kg)   SpO2 95%   BMI 28.28 kg/m    Subjective:   Patient ID: Melody Casey, female    DOB: June 17, 1946, 74 y.o.   MRN: [id number]  HPI: Mila Pair is a 74 y.o. female presenting on 11/24/2020 for Medical Management of Chronic Issues and Atrial Fibrillation (Changed coumadin dosage at last visit. 5mg  qd except takes 7.5mg  on Mondays)   HPI Coumadin recheck Target goal: 2.0-3.0 Reason on anticoagulation: afib Patient denies any bruising or bleeding or chest pain or palpitations   Relevant past medical, surgical, family and social history reviewed and updated as indicated. Interim medical history since our last visit reviewed. Allergies and medications reviewed and updated.  Review of Systems  Constitutional: Negative for chills and fever.  Eyes: Negative for visual disturbance.  Respiratory: Negative for chest tightness and shortness of breath.   Cardiovascular: Negative for chest pain, palpitations and leg swelling.  Gastrointestinal: Negative for anal bleeding and blood in stool.  Genitourinary: Negative for hematuria.  Musculoskeletal: Negative for back pain and gait problem.  Skin: Negative for rash.  Neurological: Negative for light-headedness and headaches.  Psychiatric/Behavioral: Negative for agitation and behavioral problems.  All other systems reviewed and are negative.   Per HPI unless specifically indicated above   Allergies as of 11/24/2020      Reactions   Miralax [polyethylene Glycol] Swelling, Rash   Took [facility] brand developed rash. Patient states she tolerated name brand MiraLax in the past. 09/01/15. Patient states she had diffuse swelling including swelling of her lips.    Vancomycin Rash, Shortness Of Breath   Acetaminophen Hives   Oxycodone-acetaminophen Itching   Sulfa Antibiotics Rash   All over rash   Sulfacetamide Sodium Rash   All  over rash   Sulfasalazine Rash   All over rash All over rash   Banana Other (See Comments)   Unknown   Latex Rash   Tape Rash      Medication List       Accurate as of November 24, 2020  1:49 PM. If you have any questions, ask your nurse or doctor.        ALPRAZolam 0.25 MG tablet Commonly known as: XANAX Take 1 tablet (0.25 mg total) by mouth 3 (three) times daily as needed for anxiety.   amLODipine 5 MG tablet Commonly known as: NORVASC Take 1 tablet (5 mg total) by mouth daily.   atorvastatin 40 MG tablet Commonly known as: LIPITOR TAKE 1 AND 1/2 TABLETS BY MOUTH EVERY DAY   dofetilide 250 MCG capsule Commonly known as: TIKOSYN Take 1 capsule (250 mcg total) by mouth 2 (two) times daily.   gabapentin 300 MG capsule Commonly known as: NEURONTIN Take 1 capsule (300 mg total) by mouth 2 (two) times daily.   hydroxychloroquine 200 MG tablet Commonly known as: PLAQUENIL Take 1 tablet (200 mg total) by mouth 2 (two) times daily.   leflunomide 20 MG tablet Commonly known as: ARAVA Take 20 mg by mouth daily.   lisinopril 20 MG tablet Commonly known as: ZESTRIL Take 1 tablet (20 mg total) by mouth daily.   magnesium oxide 400 MG tablet Commonly known as: MAG-OX Take 400 mg by mouth 2 (two) times daily.   metoprolol succinate 25 MG 24 hr tablet Commonly known as: TOPROL-XL Take 1 tablet (25  mg total) by mouth daily.   potassium chloride 10 MEQ tablet Commonly known as: KLOR-CON TAKE 1 TABLET BY MOUTH DAILY   warfarin 5 MG tablet Commonly known as: COUMADIN Take as directed by the anticoagulation clinic. If you are unsure how to take this medication, talk to your nurse or doctor. Original instructions: Take 1 tablet (5 mg total) by mouth daily at 4 PM.        Objective:   BP (!) 147/69   Pulse 60   Temp (!) 97 F (36.1 C)   Ht 5\' 8"  (1.727 m)   Wt 186 lb (84.4 kg)   SpO2 95%   BMI 28.28 kg/m   Wt Readings from Last 3 Encounters:  11/24/20 186  lb (84.4 kg)  10/17/20 185 lb (83.9 kg)  09/27/20 185 lb (83.9 kg)    Physical Exam Vitals and nursing note reviewed.  Constitutional:      General: She is not in acute distress.    Appearance: She is well-developed and well-nourished. She is not diaphoretic.  Eyes:     Extraocular Movements: EOM normal.     Conjunctiva/sclera: Conjunctivae normal.  Cardiovascular:     Pulses: Intact distal pulses.  Musculoskeletal:        General: No edema.  Skin:    General: Skin is warm and dry.     Findings: No rash.  Neurological:     Mental Status: She is alert and oriented to person, place, and time.     Coordination: Coordination normal.  Psychiatric:        Mood and Affect: Mood and affect normal.        Behavior: Behavior normal.     Description   Continue to take warfarin 7.5 mg on Monday only and 5 mg all other days.   INR 2.2 today. Goal 2.0-3.0  Recheck in 6-8 weeks      Assessment & Plan:   Problem List Items Addressed This Visit      Cardiovascular and Mediastinum   Persistent atrial fibrillation ([place])     Other   High risk medication use    Other Visit Diagnoses    Atrial fibrillation, unspecified type ([place])    -  Primary   Relevant Orders   CoaguChek XS/INR Waived       Follow up plan: Return if symptoms worsen or fail to improve, for 6 To 8 weeks.  Counseling provided for all of the vaccine components Orders Placed This Encounter  Procedures  . CoaguChek XS/INR [place], MD [facility] 11/24/2020, 1:49 PM

## 2020-11-25 ENCOUNTER — Other Ambulatory Visit (INDEPENDENT_AMBULATORY_CARE_PROVIDER_SITE_OTHER): Payer: Self-pay

## 2020-11-25 DIAGNOSIS — Z23 Encounter for immunization: Secondary | ICD-10-CM

## 2020-12-01 ENCOUNTER — Telehealth: Payer: Self-pay | Admitting: Family Medicine

## 2020-12-01 NOTE — Telephone Encounter (Signed)
Patient states that she normally has abx sent in for her when she has uti.  PCP off this week.  Please advise

## 2020-12-01 NOTE — Telephone Encounter (Signed)
Pt thinks that she has a uti and would like to bring in a urine sample, says that she has had medicine called in for it before. She does not have mychart or and OBGYN

## 2020-12-01 NOTE — Telephone Encounter (Signed)
Patient will need to go to urgent care. Cann ot call in antibiotic without being seen or bringing in urine.

## 2020-12-01 NOTE — Telephone Encounter (Signed)
Attempted to contact patient - NA °

## 2020-12-26 ENCOUNTER — Encounter: Payer: Self-pay | Admitting: Nurse Practitioner

## 2020-12-26 ENCOUNTER — Other Ambulatory Visit: Payer: Self-pay

## 2020-12-26 ENCOUNTER — Ambulatory Visit (INDEPENDENT_AMBULATORY_CARE_PROVIDER_SITE_OTHER): Payer: Medicare Other | Admitting: Nurse Practitioner

## 2020-12-26 DIAGNOSIS — R3989 Other symptoms and signs involving the genitourinary system: Secondary | ICD-10-CM | POA: Diagnosis not present

## 2020-12-26 DIAGNOSIS — R300 Dysuria: Secondary | ICD-10-CM | POA: Insufficient documentation

## 2020-12-26 MED ORDER — CIPROFLOXACIN HCL 250 MG PO TABS: 250.0000 mg | ORAL_TABLET | Freq: Two times a day (BID) | ORAL | 0 refills | Status: DC

## 2020-12-26 NOTE — Progress Notes (Signed)
   Virtual Visit via telephone Note Due to COVID-19 pandemic this visit was conducted virtually. This visit type was conducted due to national recommendations for restrictions regarding the COVID-19 Pandemic (e.g. social distancing, sheltering in place) in an effort to limit this patient's exposure and mitigate transmission in our community. All issues noted in this document were discussed and addressed.  A physical exam was not performed with this format.  I connected with Melody Casey on 12/26/20 at 11:29 Am by telephone and verified that I am speaking with the correct person using two identifiers. Melody Casey is currently located at home during visit. The provider, Ivy Lynn, NP is located in their office at time of visit.  I discussed the limitations, risks, security and privacy concerns of performing an evaluation and management service by telephone and the availability of in person appointments. I also discussed with the patient that there may be a patient responsible charge related to this service. The patient expressed understanding and agreed to proceed.   History and Present Illness:  Urinary Tract Infection  This is a recurrent problem. Episode onset: In the past 14 days. The problem occurs intermittently. The problem has been unchanged. There has been no fever. She is not sexually active. Associated symptoms include urgency. Pertinent negatives include no chills, discharge, flank pain, hematuria, hesitancy or nausea. She has tried nothing for the symptoms. Her past medical history is significant for recurrent UTIs.      Review of Systems  Constitutional: Negative for chills and fever.  Gastrointestinal: Negative for nausea.  Genitourinary: Positive for urgency. Negative for flank pain, hematuria and hesitancy.  All other systems reviewed and are negative.    Observations/Objective:  Tele-Visit  Assessment and Plan: Suspected UTI UTI symptoms not well controlled.   Patient reports cloudy urine, urinary frequency, in the last 2 weeks.  Patient has a history of recurrent UTI every 3 months.  Provided education to patient to increase hydration, started patient on Cipro 250 mg tablet twice daily for 3 days. Follow-up with worsening/ unresolved symptoms. Rx sent to pharmacy. Patient verbalized understanding.   Follow Up Instructions: Follow-up with worsening unresolved symptoms.    I discussed the assessment and treatment plan with the patient. The patient was provided an opportunity to ask questions and all were answered. The patient agreed with the plan and demonstrated an understanding of the instructions.   The patient was advised to call back or seek an in-person evaluation if the symptoms worsen or if the condition fails to improve as anticipated.  The above assessment and management plan was discussed with the patient. The patient verbalized understanding of and has agreed to the management plan. Patient is aware to call the clinic if symptoms persist or worsen. Patient is aware when to return to the clinic for a follow-up visit. Patient educated on when it is appropriate to go to the emergency department.   Time call ended:  11:47am  I provided 8 minutes of non-face-to-face time during this encounter.    Ivy Lynn, NP

## 2020-12-26 NOTE — Assessment & Plan Note (Signed)
UTI symptoms not well controlled.  Patient reports cloudy urine, urinary frequency, in the last 2 weeks.  Patient has a history of recurrent UTI every 3 months.  Provided education to patient to increase hydration, started patient on Cipro 250 mg tablet twice daily for 3 days. Follow-up with worsening/ unresolved symptoms. Rx sent to pharmacy. Patient verbalized understanding.

## 2020-12-26 NOTE — Patient Instructions (Signed)
Urinary Tract Infection, Adult A urinary tract infection (UTI) is an infection of any part of the urinary tract. The urinary tract includes:  The kidneys.  The ureters.  The bladder.  The urethra. These organs make, store, and get rid of pee (urine) in the body. What are the causes? This infection is caused by germs (bacteria) in your genital area. These germs grow and cause swelling (inflammation) of your urinary tract. What increases the risk? The following factors may make you more likely to develop this condition:  Using a small, thin tube (catheter) to drain pee.  Not being able to control when you pee or poop (incontinence).  Being female. If you are female, these things can increase the risk: ? Using these methods to prevent pregnancy:  A medicine that kills sperm (spermicide).  A device that blocks sperm (diaphragm). ? Having low levels of a female hormone (estrogen). ? Being pregnant. You are more likely to develop this condition if:  You have genes that add to your risk.  You are sexually active.  You take antibiotic medicines.  You have trouble peeing because of: ? A prostate that is bigger than normal, if you are female. ? A blockage in the part of your body that drains pee from the bladder. ? A kidney stone. ? A nerve condition that affects your bladder. ? Not getting enough to drink. ? Not peeing often enough.  You have other conditions, such as: ? Diabetes. ? A weak disease-fighting system (immune system). ? Sickle cell disease. ? Gout. ? Injury of the spine. What are the signs or symptoms? Symptoms of this condition include:  Needing to pee right away.  Peeing small amounts often.  Pain or burning when peeing.  Blood in the pee.  Pee that smells bad or not like normal.  Trouble peeing.  Pee that is cloudy.  Fluid coming from the vagina, if you are female.  Pain in the belly or lower back. Other symptoms include:  Vomiting.  Not  feeling hungry.  Feeling mixed up (confused). This may be the first symptom in older adults.  Being tired and grouchy (irritable).  A fever.  Watery poop (diarrhea). How is this treated?  Taking antibiotic medicine.  Taking other medicines.  Drinking enough water. In some cases, you may need to see a specialist. Follow these instructions at home: Medicines  Take over-the-counter and prescription medicines only as told by your doctor.  If you were prescribed an antibiotic medicine, take it as told by your doctor. Do not stop taking it even if you start to feel better. General instructions  Make sure you: ? Pee until your bladder is empty. ? Do not hold pee for a long time. ? Empty your bladder after sex. ? Wipe from front to back after peeing or pooping if you are a female. Use each tissue one time when you wipe.  Drink enough fluid to keep your pee pale yellow.  Keep all follow-up visits.   Contact a doctor if:  You do not get better after 1-2 days.  Your symptoms go away and then come back. Get help right away if:  You have very bad back pain.  You have very bad pain in your lower belly.  You have a fever.  You have chills.  You feeling like you will vomit or you vomit. Summary  A urinary tract infection (UTI) is an infection of any part of the urinary tract.  This condition is caused by   germs in your genital area.  There are many risk factors for a UTI.  Treatment includes antibiotic medicines.  Drink enough fluid to keep your pee pale yellow. This information is not intended to replace advice given to you by your health care provider. Make sure you discuss any questions you have with your health care provider. Document Revised: 07/08/2020 Document Reviewed: 07/08/2020 Elsevier Patient Education  2021 [employer] Inc.  

## 2021-01-18 ENCOUNTER — Ambulatory Visit (INDEPENDENT_AMBULATORY_CARE_PROVIDER_SITE_OTHER): Payer: Medicare Other | Admitting: Family Medicine

## 2021-01-18 ENCOUNTER — Encounter: Payer: Self-pay | Admitting: Family Medicine

## 2021-01-18 ENCOUNTER — Other Ambulatory Visit: Payer: Self-pay

## 2021-01-18 VITALS — BP 128/67 | HR 63 | Ht 68.0 in | Wt 182.5 lb

## 2021-01-18 DIAGNOSIS — I4819 Other persistent atrial fibrillation: Secondary | ICD-10-CM

## 2021-01-18 DIAGNOSIS — Z79899 Other long term (current) drug therapy: Secondary | ICD-10-CM

## 2021-01-18 DIAGNOSIS — F411 Generalized anxiety disorder: Secondary | ICD-10-CM | POA: Diagnosis not present

## 2021-01-18 DIAGNOSIS — I10 Essential (primary) hypertension: Secondary | ICD-10-CM | POA: Diagnosis not present

## 2021-01-18 DIAGNOSIS — E785 Hyperlipidemia, unspecified: Secondary | ICD-10-CM

## 2021-01-18 DIAGNOSIS — I4891 Unspecified atrial fibrillation: Secondary | ICD-10-CM | POA: Diagnosis not present

## 2021-01-18 LAB — COAGUCHEK XS/INR WAIVED
INR: 2.8 — ABNORMAL HIGH (ref 0.9–1.1)
Prothrombin Time: 33.5 s

## 2021-01-18 MED ORDER — ALPRAZOLAM 0.25 MG PO TABS: 0.2500 mg | ORAL_TABLET | Freq: Three times a day (TID) | ORAL | 2 refills | Status: DC | PRN

## 2021-01-18 NOTE — Progress Notes (Signed)
BP 128/67   Pulse 63   Ht 5' 8"  (1.727 m)   Wt 182 lb 8 oz (82.8 kg)   SpO2 95%   BMI 27.75 kg/m    Subjective:   Patient ID: Melody Casey, female    DOB: 1946/08/19, 75 y.o.   MRN: [id number]  HPI: Melody Casey is a 75 y.o. female presenting on 01/18/2021 for Medical Management of Chronic Issues and Atrial Fibrillation   HPI Coumadin recheck Target goal: 2.0-3.0 Reason on anticoagulation: A. fib Patient denies any bruising or bleeding or chest pain or palpitations   Hypertension Patient is currently on amlodipine and lisinopril and metoprolol, and their blood pressure today is 128/67. Patient denies any lightheadedness or dizziness. Patient denies headaches, blurred vision, chest pains, shortness of breath, or weakness. Denies any side effects from medication and is content with current medication.   Hyperlipidemia Patient is coming in for recheck of his hyperlipidemia. The patient is currently taking atorvastatin. They deny any issues with myalgias or history of liver damage from it. They deny any focal numbness or weakness or chest pain.   Anxiety recheck Patient is coming in for anxiety recheck and just needs a refill on the alprazolam, she uses it very infrequently. Current rx-Xanax 0.25 mg daily as needed # meds rx-30 Effectiveness of current meds-works well, only uses very infrequently Adverse reactions form meds-none  Pill count performed-No Last drug screen -10/21/2020 ( high risk q61m moderate risk q678mlow risk yearly ) Urine drug screen today- No [place]  If yes were their any concerning findings? -None  No flowsheet data found.   Controlled substance contract signed on: 10/21/2020  Relevant past medical, surgical, family and social history reviewed and updated as indicated. Interim medical history since our last visit reviewed. Allergies and medications reviewed and updated.  Review of Systems  Constitutional: Negative for chills and  fever.  HENT: Negative for congestion, ear discharge and ear pain.   Eyes: Negative for redness and visual disturbance.  Respiratory: Negative for chest tightness and shortness of breath.   Cardiovascular: Negative for chest pain and leg swelling.  Genitourinary: Negative for difficulty urinating and dysuria.  Musculoskeletal: Negative for back pain and gait problem.  Skin: Negative for rash.  Neurological: Negative for light-headedness and headaches.  Psychiatric/Behavioral: Negative for agitation, behavioral problems, self-injury, sleep disturbance and suicidal ideas. The patient is nervous/anxious.   All other systems reviewed and are negative.   Per HPI unless specifically indicated above   Allergies as of 01/18/2021      Reactions   Miralax [polyethylene Glycol] Swelling, Rash   Took [facility] brand developed rash. Patient states she tolerated name brand MiraLax in the past. 09/01/15. Patient states she had diffuse swelling including swelling of her lips.    Vancomycin Rash, Shortness Of Breath   Acetaminophen Hives   Oxycodone-acetaminophen Itching   Sulfa Antibiotics Rash   All over rash   Sulfacetamide Sodium Rash   All over rash   Sulfasalazine Rash   All over rash All over rash   Banana Other (See Comments)   Unknown   Latex Rash   Tape Rash      Medication List       Accurate as of January 18, 2021  1:30 PM. If you have any questions, ask your nurse or doctor.        STOP taking these medications   ciprofloxacin 250 MG tablet Commonly known as: Cipro Stopped by: JoFransisca Kaufmann  Auda Finfrock, MD   gabapentin 300 MG capsule Commonly known as: NEURONTIN Stopped by: Worthy Rancher, MD     TAKE these medications   ALPRAZolam 0.25 MG tablet Commonly known as: XANAX Take 1 tablet (0.25 mg total) by mouth 3 (three) times daily as needed for anxiety.   amLODipine 5 MG tablet Commonly known as: NORVASC Take 1 tablet (5 mg total) by mouth daily.   atorvastatin 40 MG  tablet Commonly known as: LIPITOR TAKE 1 AND 1/2 TABLETS BY MOUTH EVERY DAY   dofetilide 250 MCG capsule Commonly known as: TIKOSYN Take 1 capsule (250 mcg total) by mouth 2 (two) times daily.   hydroxychloroquine 200 MG tablet Commonly known as: PLAQUENIL Take 1 tablet (200 mg total) by mouth 2 (two) times daily.   leflunomide 20 MG tablet Commonly known as: ARAVA Take 20 mg by mouth daily.   lisinopril 20 MG tablet Commonly known as: ZESTRIL Take 1 tablet (20 mg total) by mouth daily.   magnesium oxide 400 MG tablet Commonly known as: MAG-OX Take 400 mg by mouth 2 (two) times daily.   metoprolol succinate 25 MG 24 hr tablet Commonly known as: TOPROL-XL Take 1 tablet (25 mg total) by mouth daily.   potassium chloride 10 MEQ tablet Commonly known as: KLOR-CON TAKE 1 TABLET BY MOUTH DAILY   warfarin 5 MG tablet Commonly known as: COUMADIN Take as directed by the anticoagulation clinic. If you are unsure how to take this medication, talk to your nurse or doctor. Original instructions: Take 1 tablet (5 mg total) by mouth daily at 4 PM.        Objective:   BP 128/67   Pulse 63   Ht 5' 8"  (1.727 m)   Wt 182 lb 8 oz (82.8 kg)   SpO2 95%   BMI 27.75 kg/m   Wt Readings from Last 3 Encounters:  01/18/21 182 lb 8 oz (82.8 kg)  11/24/20 186 lb (84.4 kg)  10/17/20 185 lb (83.9 kg)    Physical Exam Vitals and nursing note reviewed.  Constitutional:      General: She is not in acute distress.    Appearance: She is well-developed and well-nourished. She is not diaphoretic.  Eyes:     Extraocular Movements: EOM normal.     Conjunctiva/sclera: Conjunctivae normal.  Cardiovascular:     Rate and Rhythm: Normal rate and regular rhythm.     Pulses: Intact distal pulses.     Heart sounds: Normal heart sounds. No murmur heard.   Pulmonary:     Effort: Pulmonary effort is normal. No respiratory distress.     Breath sounds: Normal breath sounds. No wheezing.   Musculoskeletal:        General: No tenderness or edema. Normal range of motion.  Skin:    General: Skin is warm and dry.     Findings: No rash.  Neurological:     Mental Status: She is alert and oriented to person, place, and time.     Coordination: Coordination normal.  Psychiatric:        Mood and Affect: Mood and affect normal.        Behavior: Behavior normal.       Assessment & Plan:   Problem List Items Addressed This Visit      Cardiovascular and Mediastinum   Essential hypertension   Relevant Orders   CBC with Differential/Platelet   CMP14+EGFR   Lipid panel   Persistent atrial fibrillation ([facility])  Other   Dyslipidemia, goal LDL below 70   Relevant Orders   CBC with Differential/Platelet   CMP14+EGFR   Lipid panel   High risk medication use    Other Visit Diagnoses    Atrial fibrillation, unspecified type ([place])    -  Primary   Relevant Orders   CoaguChek XS/INR Waived   Anxiety state       Relevant Medications   ALPRAZolam (XANAX) 0.25 MG tablet      Continue alprazolam, did refill, will check blood work today.  Description   Continue to take warfarin 7.5 mg on Monday only and 5 mg all other days.   INR 2.8 today. Goal 2.0-3.0  Recheck in 6-8 weeks     Follow up plan: Return if symptoms worsen or fail to improve, for 6 to 8-week INR recheck.  Counseling provided for all of the vaccine components Orders Placed This Encounter  Procedures  . CBC with Differential/Platelet  . CMP14+EGFR  . Lipid panel  . CoaguChek XS/INR [place], MD [facility] 01/18/2021, 1:30 PM

## 2021-01-19 LAB — LIPID PANEL
Chol/HDL Ratio: 3.5 ratio (ref 0.0–4.4)
Cholesterol, Total: 143 mg/dL (ref 100–199)
HDL: 41 mg/dL (ref >39)
LDL Chol Calc (NIH): 75 mg/dL (ref 0–99)
Triglycerides: 158 mg/dL — ABNORMAL HIGH (ref 0–149)
VLDL Cholesterol Cal: 27 mg/dL (ref 5–40)

## 2021-01-19 LAB — CBC WITH DIFFERENTIAL/PLATELET
Basophils Absolute: 0 10*3/uL (ref 0.0–0.2)
Basos: 1 %
EOS (ABSOLUTE): 0.1 10*3/uL (ref 0.0–0.4)
Eos: 3 %
Hematocrit: 36.9 % (ref 34.0–46.6)
Hemoglobin: 12.1 g/dL (ref 11.1–15.9)
Immature Grans (Abs): 0 10*3/uL (ref 0.0–0.1)
Immature Granulocytes: 0 %
Lymphocytes Absolute: 1.1 10*3/uL (ref 0.7–3.1)
Lymphs: 25 %
MCH: 29.4 pg (ref 26.6–33.0)
MCHC: 32.8 g/dL (ref 31.5–35.7)
MCV: 90 fL (ref 79–97)
Monocytes Absolute: 0.5 10*3/uL (ref 0.1–0.9)
Monocytes: 11 %
Neutrophils Absolute: 2.5 10*3/uL (ref 1.4–7.0)
Neutrophils: 60 %
Platelets: 148 10*3/uL — ABNORMAL LOW (ref 150–450)
RBC: 4.12 x10E6/uL (ref 3.77–5.28)
RDW: 13.1 % (ref 11.7–15.4)
WBC: 4.2 10*3/uL (ref 3.4–10.8)

## 2021-01-19 LAB — CMP14+EGFR
ALT: 11 IU/L (ref 0–32)
AST: 15 IU/L (ref 0–40)
Albumin/Globulin Ratio: 1.4 (ref 1.2–2.2)
Albumin: 3.8 g/dL (ref 3.7–4.7)
Alkaline Phosphatase: 113 IU/L (ref 44–121)
BUN/Creatinine Ratio: 24 (ref 12–28)
BUN: 15 mg/dL (ref 8–27)
Bilirubin Total: 0.2 mg/dL (ref 0.0–1.2)
CO2: 20 mmol/L (ref 20–29)
Calcium: 10.1 mg/dL (ref 8.7–10.3)
Chloride: 107 mmol/L — ABNORMAL HIGH (ref 96–106)
Creatinine, Ser: 0.63 mg/dL (ref 0.57–1.00)
GFR calc Af Amer: 100 mL/min/{1.73_m2} (ref >59)
GFR calc non Af Amer: 87 mL/min/{1.73_m2} (ref >59)
Globulin, Total: 2.7 g/dL (ref 1.5–4.5)
Glucose: 136 mg/dL — ABNORMAL HIGH (ref 65–99)
Potassium: 4.1 mmol/L (ref 3.5–5.2)
Sodium: 142 mmol/L (ref 134–144)
Total Protein: 6.5 g/dL (ref 6.0–8.5)

## 2021-01-25 ENCOUNTER — Other Ambulatory Visit: Payer: Self-pay | Admitting: Family Medicine

## 2021-01-25 DIAGNOSIS — I480 Paroxysmal atrial fibrillation: Secondary | ICD-10-CM

## 2021-01-25 DIAGNOSIS — I4819 Other persistent atrial fibrillation: Secondary | ICD-10-CM

## 2021-01-25 DIAGNOSIS — Z7901 Long term (current) use of anticoagulants: Secondary | ICD-10-CM

## 2021-01-26 DIAGNOSIS — B351 Tinea unguium: Secondary | ICD-10-CM | POA: Diagnosis not present

## 2021-01-26 DIAGNOSIS — M79676 Pain in unspecified toe(s): Secondary | ICD-10-CM | POA: Diagnosis not present

## 2021-01-26 DIAGNOSIS — I70203 Unspecified atherosclerosis of native arteries of extremities, bilateral legs: Secondary | ICD-10-CM | POA: Diagnosis not present

## 2021-01-26 DIAGNOSIS — L84 Corns and callosities: Secondary | ICD-10-CM | POA: Diagnosis not present

## 2021-01-31 ENCOUNTER — Other Ambulatory Visit: Payer: Self-pay | Admitting: Cardiology

## 2021-02-02 DIAGNOSIS — M1712 Unilateral primary osteoarthritis, left knee: Secondary | ICD-10-CM | POA: Diagnosis not present

## 2021-02-14 NOTE — Progress Notes (Unsigned)
Cardiology Office Note   Date:  02/15/2021   ID:  KORTNY LIRETTE, DOB [birth year], MRN [id number]  PCP:  Dettinger, [name], MD  Cardiologist:   Minus Breeding, MD   No chief complaint on file.     History of Present Illness: Melody Casey is a 75 y.o. female who presents for ongoing assessment and management of coronary artery disease, and history of paroxysmal atrial fibrillation.     Since I last saw her she has had a few brief paroxysms of fibrillation.  However, she typically does well. The patient denies any new symptoms such as chest discomfort, neck or arm discomfort. There has been no new shortness of breath, PND or orthopnea. There have been no reported palpitations, presyncope or syncope.  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   Past Medical History:  Diagnosis Date  . AAA (abdominal aortic aneurysm) ([place])   . Acute diverticulitis   . Anxiety   . Arthritis   . Atrial fibrillation ([place])    a. Dx 03/2011 - on tikosyn/coumadin.  Marland Kitchen CAD (coronary artery disease)    a. NSTEMI 02/2011: occ mid Cx, DES to OM2, residual nonobst LAD dz.  . Cancer ([place])    breast  . Diabetes mellitus   . Diverticulitis   . Diverticulitis of colon     2008, 04/2011, 12/2014, 08/2015  . 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 Hemangioma    liver  . HLD (hyperlipidemia)   . HTN (hypertension)   . Osteoporosis   . Tobacco abuse    stopped smoking 2012  . Ureteral obstruction    History of gross hematuria/right hydronephrosis 2/2 to uteropelvic junction obstruction, s/p cystoscopy in January 2007 with bilateral retrograde pyelography, right ureter arthroscopy, right ureteral stent placement, bladder biopsies, stent removal since then.  . Wears dentures    top    Past Surgical History:  Procedure Laterality Date  . BIOPSY N/A 11/01/2015   Procedure: BIOPSY;  Surgeon: Danie Binder, MD;  Location: [facility];  Service: Endoscopy;  Laterality: N/A;  . BREAST  LUMPECTOMY WITH NEEDLE LOCALIZATION AND AXILLARY SENTINEL LYMPH NODE BX Right 03/01/2014   Procedure: BREAST LUMPECTOMY WITH NEEDLE LOCALIZATION AND AXILLARY SENTINEL LYMPH NODE BX;  Surgeon: Edward Jolly, MD;  Location: [facility];  Service: General;  Laterality: Right;  . BREAST SURGERY    . CARDIAC CATHETERIZATION  2012   stent  . COLON RESECTION N/A 07/25/2018   Procedure: COLOSTOMY;  Surgeon: Rolm Bookbinder, MD;  Location: [facility];  Service: General;  Laterality: N/A;  . COLONOSCOPY WITH PROPOFOL N/A 06/29/2014   Dr. Wynetta Emery: universal diverticulosis  . CORONARY STENT PLACEMENT  2012  . ESOPHAGOGASTRODUODENOSCOPY (EGD) WITH PROPOFOL N/A 11/01/2015   Procedure: ESOPHAGOGASTRODUODENOSCOPY (EGD) WITH PROPOFOL;  Surgeon: Danie Binder, MD;  Location: [facility];  Service: Endoscopy;  Laterality: N/A;  . KNEE ARTHROSCOPY     left  . LAPAROTOMY N/A 07/25/2018   Procedure: EXPLORATORY LAPAROTOMY;  Surgeon: Rolm Bookbinder, MD;  Location: [facility];  Service: General;  Laterality: N/A;  . PARTIAL COLECTOMY N/A 07/25/2018   Procedure: COLECTOMY;  Surgeon: Rolm Bookbinder, MD;  Location: [facility];  Service: General;  Laterality: N/A;  . Right leg surgery  age 40   As a child for  ?scleroderma per patient  . TONSILLECTOMY AND ADENOIDECTOMY    . TUBAL LIGATION    . Ureteral surgery  2011  rt ureterostomy-stent     Current Outpatient Medications  Medication Sig Dispense Refill  . ALPRAZolam (XANAX) 0.25 MG tablet Take 1 tablet (0.25 mg total) by mouth 3 (three) times daily as needed for anxiety. 30 tablet 2  . amLODipine (NORVASC) 5 MG tablet TAKE 1 TABLET(5 MG) BY MOUTH DAILY 90 tablet 2  . atorvastatin (LIPITOR) 40 MG tablet TAKE 1 AND 1/2 TABLETS BY MOUTH EVERY DAY 45 tablet 3  . dofetilide (TIKOSYN) 250 MCG capsule Take 1 capsule (250 mcg total) by mouth 2 (two) times daily. 180 capsule 3  . hydroxychloroquine (PLAQUENIL) 200 MG tablet Take 1 tablet (200 mg total) by mouth  2 (two) times daily. 60 tablet 0  . leflunomide (ARAVA) 20 MG tablet Take 20 mg by mouth daily.    Marland Kitchen lisinopril (ZESTRIL) 20 MG tablet Take 1 tablet (20 mg total) by mouth daily. 90 tablet 3  . magnesium oxide (MAG-OX) 400 MG tablet Take 400 mg by mouth 2 (two) times daily.    . metoprolol succinate (TOPROL-XL) 25 MG 24 hr tablet TAKE 1 TABLET(25 MG) BY MOUTH DAILY 90 tablet 0  . potassium chloride (KLOR-CON) 10 MEQ tablet TAKE 1 TABLET BY MOUTH DAILY 240 tablet 2  . warfarin (COUMADIN) 5 MG tablet TAKE 1 AND 1/2 TABLETS(7.5 MG) BY MOUTH DAILY 135 tablet 0   No current facility-administered medications for this visit.    Allergies:   Miralax [polyethylene glycol], Vancomycin, Acetaminophen, Oxycodone-acetaminophen, Sulfa antibiotics, Sulfacetamide sodium, Sulfasalazine, Banana, Latex, and Tape     ROS:  Please see the history of present illness.   Otherwise, review of systems are positive for none.   All other systems are reviewed and negative.    PHYSICAL EXAM: VS:  BP 122/77   Pulse 64   Ht 5\' 8"  (1.727 m)   Wt 180 lb 6.4 oz (81.8 kg)   SpO2 95%   BMI 27.43 kg/m  , BMI Body mass index is 27.43 kg/m. GENERAL:  Well appearing NECK:  No jugular venous distention, waveform within normal limits, carotid upstroke brisk and symmetric, no bruits, no thyromegaly LUNGS:  Clear to auscultation bilaterally CHEST:  Unremarkable HEART:  PMI not displaced or sustained,S1 and S2 within normal limits, no S3, no S4, no clicks, no rubs, no murmurs ABD:  Flat, positive bowel sounds normal in frequency in pitch, no bruits, no rebound, no guarding, no midline pulsatile mass, no hepatomegaly, no splenomegaly EXT:  2 plus pulses throughout, no edema, no cyanosis no clubbing  EKG:  EKG is  ordered today. Normal sinus rhythm, rate 64, axis within normal limits, intervals within normal limits including QTC, no acute ST-T wave changes.  Recent Labs: 05/23/2020: TSH 2.790 01/18/2021: ALT 11; BUN 15;  Creatinine, Ser 0.63; Hemoglobin 12.1; Platelets 148; Potassium 4.1; Sodium 142    Lipid Panel    Component Value Date/Time   CHOL 143 01/18/2021 1310   TRIG 158 (H) 01/18/2021 1310   HDL 41 01/18/2021 1310   CHOLHDL 3.5 01/18/2021 1310   CHOLHDL 6.2 02/14/2011 0330   VLDL 26 02/14/2011 0330   LDLCALC 75 01/18/2021 1310   LDLDIRECT 158.6 04/05/2008 0833      Wt Readings from Last 3 Encounters:  02/15/21 180 lb 6.4 oz (81.8 kg)  01/18/21 182 lb 8 oz (82.8 kg)  11/24/20 186 lb (84.4 kg)      Other studies Reviewed: Additional studies/ records that were reviewed today include: None. Review of the above records demonstrates: See elsewhere  ASSESSMENT AND PLAN:  Atrial fibrillation:    She has had no symptomatic paroxysms.  She is tolerating the Tikosyn.  I will make no change to her meds.  She is instructed to get a magnesium when she gets her blood checked in a couple of months.  Potassium has been okay recently.   Hypercholesterolemia:  LDL was 75.  No change in therapy.   Hypertension:    Blood pressure is well controlled.  No change in therapy.    Current medicines are reviewed at length with the patient today.  The patient does not have concerns regarding medicines.  The following changes have been made:  None  Labs/ tests ordered today include:  None  Orders Placed This Encounter  Procedures  . EKG 12-Lead     Disposition:   FU with me in 12 months.     Signed, Minus Breeding, MD  02/15/2021 1:15 PM    [facility]

## 2021-02-15 ENCOUNTER — Ambulatory Visit (INDEPENDENT_AMBULATORY_CARE_PROVIDER_SITE_OTHER): Payer: Medicare Other | Admitting: Cardiology

## 2021-02-15 ENCOUNTER — Encounter: Payer: Self-pay | Admitting: Cardiology

## 2021-02-15 ENCOUNTER — Other Ambulatory Visit: Payer: Self-pay

## 2021-02-15 VITALS — BP 122/77 | HR 64 | Ht 68.0 in | Wt 180.4 lb

## 2021-02-15 DIAGNOSIS — E785 Hyperlipidemia, unspecified: Secondary | ICD-10-CM

## 2021-02-15 DIAGNOSIS — I10 Essential (primary) hypertension: Secondary | ICD-10-CM

## 2021-02-15 DIAGNOSIS — I4891 Unspecified atrial fibrillation: Secondary | ICD-10-CM

## 2021-02-15 NOTE — Patient Instructions (Signed)
Medication Instructions:  Continue current medications  *If you need a refill on your cardiac medications before your next appointment, please call your pharmacy*   Lab Work: None Ordered   Testing/Procedures: None Ordered   Follow-Up: At [facility], you and your health needs are our priority.  As part of our continuing mission to provide you with exceptional heart care, we have created designated Provider Care Teams.  These Care Teams include your primary Cardiologist (physician) and Advanced Practice Providers (APPs -  Physician Assistants and Nurse Practitioners) who all work together to provide you with the care you need, when you need it.  We recommend signing up for the patient portal called "MyChart".  Sign up information is provided on this After Visit Summary.  MyChart is used to connect with patients for Virtual Visits (Telemedicine).  Patients are able to view lab/test results, encounter notes, upcoming appointments, etc.  Non-urgent messages can be sent to your provider as well.   To learn more about what you can do with MyChart, go to [email or web address].    Your next appointment:   1 year(s)  The format for your next appointment:   In Person  Provider:   You may see James Hochrein, MD or one of the following Advanced Practice Providers on your designated Care Team:    Rhonda Barrett, PA-C  Kathryn Lawrence, DNP, ANP     

## 2021-03-01 ENCOUNTER — Ambulatory Visit (INDEPENDENT_AMBULATORY_CARE_PROVIDER_SITE_OTHER): Payer: Medicare Other | Admitting: Family Medicine

## 2021-03-01 ENCOUNTER — Encounter: Payer: Self-pay | Admitting: Family Medicine

## 2021-03-01 ENCOUNTER — Other Ambulatory Visit: Payer: Self-pay

## 2021-03-01 VITALS — BP 145/76 | HR 66 | Ht 68.0 in | Wt 182.0 lb

## 2021-03-01 DIAGNOSIS — I4819 Other persistent atrial fibrillation: Secondary | ICD-10-CM

## 2021-03-01 DIAGNOSIS — R197 Diarrhea, unspecified: Secondary | ICD-10-CM

## 2021-03-01 DIAGNOSIS — I480 Paroxysmal atrial fibrillation: Secondary | ICD-10-CM

## 2021-03-01 DIAGNOSIS — I4891 Unspecified atrial fibrillation: Secondary | ICD-10-CM

## 2021-03-01 DIAGNOSIS — Z933 Colostomy status: Secondary | ICD-10-CM | POA: Diagnosis not present

## 2021-03-01 DIAGNOSIS — Z7901 Long term (current) use of anticoagulants: Secondary | ICD-10-CM | POA: Diagnosis not present

## 2021-03-01 DIAGNOSIS — Z79899 Other long term (current) drug therapy: Secondary | ICD-10-CM

## 2021-03-01 DIAGNOSIS — Z23 Encounter for immunization: Secondary | ICD-10-CM

## 2021-03-01 LAB — COAGUCHEK XS/INR WAIVED
INR: 3.1 — ABNORMAL HIGH (ref 0.9–1.1)
Prothrombin Time: 37.1 s

## 2021-03-01 MED ORDER — METOPROLOL SUCCINATE ER 25 MG PO TB24: ORAL_TABLET | ORAL | 3 refills | Status: DC

## 2021-03-01 MED ORDER — WARFARIN SODIUM 5 MG PO TABS: ORAL_TABLET | ORAL | 3 refills | Status: DC

## 2021-03-01 NOTE — Progress Notes (Signed)
BP (!) 145/76   Pulse 66   Ht 5\' 8"  (1.727 m)   Wt 182 lb (82.6 kg)   SpO2 96%   BMI 27.67 kg/m    Subjective:   Patient ID: Melody Casey, female    DOB: 07-18-1946, 75 y.o.   MRN: [id number]  HPI: Melody Casey is a 75 y.o. female presenting on 03/01/2021 for Atrial Fibrillation   HPI Coumadin recheck Target goal: 2.0-3.0 Reason on anticoagulation: Paroxysmal A. fib Patient denies any bruising or bleeding or chest pain or palpitations   Patient also comes in complaining of increased persistent diarrhea.  She says is been going on for the past 4 to 5 months, she had a colostomy for quite some time diarrhea seems to be worsening.  She is not having any blood in her stool.  She has hemorrhoids have been on them but otherwise doing really well.  Relevant past medical, surgical, family and social history reviewed and updated as indicated. Interim medical history since our last visit reviewed. Allergies and medications reviewed and updated.  Review of Systems  Constitutional: Negative for chills and fever.  Eyes: Negative for visual disturbance.  Respiratory: Negative for chest tightness and shortness of breath.   Cardiovascular: Negative for chest pain and leg swelling.  Gastrointestinal: Positive for diarrhea. Negative for abdominal distention, abdominal pain, blood in stool, constipation and vomiting.  Genitourinary: Negative for hematuria.  Musculoskeletal: Negative for back pain and gait problem.  Skin: Negative for rash.  Neurological: Negative for light-headedness and headaches.  Psychiatric/Behavioral: Negative for agitation and behavioral problems.  All other systems reviewed and are negative.   Per HPI unless specifically indicated above   Allergies as of 03/01/2021      Reactions   Miralax [polyethylene Glycol] Swelling, Rash   Took [employer] brand developed rash. Patient states she tolerated name brand MiraLax in the past. 09/01/15. Patient states she had diffuse  swelling including swelling of her lips.    Vancomycin Rash, Shortness Of Breath   Acetaminophen Hives   Oxycodone-acetaminophen Itching   Sulfa Antibiotics Rash   All over rash   Sulfacetamide Sodium Rash   All over rash   Sulfasalazine Rash   All over rash All over rash   Banana Other (See Comments)   Unknown   Latex Rash   Tape Rash      Medication List       Accurate as of March 01, 2021  2:06 PM. If you have any questions, ask your nurse or doctor.        ALPRAZolam 0.25 MG tablet Commonly known as: XANAX Take 1 tablet (0.25 mg total) by mouth 3 (three) times daily as needed for anxiety.   amLODipine 5 MG tablet Commonly known as: NORVASC TAKE 1 TABLET(5 MG) BY MOUTH DAILY   atorvastatin 40 MG tablet Commonly known as: LIPITOR TAKE 1 AND 1/2 TABLETS BY MOUTH EVERY DAY   dofetilide 250 MCG capsule Commonly known as: TIKOSYN Take 1 capsule (250 mcg total) by mouth 2 (two) times daily.   hydroxychloroquine 200 MG tablet Commonly known as: PLAQUENIL Take 1 tablet (200 mg total) by mouth 2 (two) times daily.   leflunomide 20 MG tablet Commonly known as: ARAVA Take 20 mg by mouth daily.   lisinopril 20 MG tablet Commonly known as: ZESTRIL Take 1 tablet (20 mg total) by mouth daily.   magnesium oxide 400 MG tablet Commonly known as: MAG-OX Take 400 mg by mouth 2 (two) times daily.  metoprolol succinate 25 MG 24 hr tablet Commonly known as: TOPROL-XL TAKE 1 TABLET(25 MG) BY MOUTH DAILY   potassium chloride 10 MEQ tablet Commonly known as: KLOR-CON TAKE 1 TABLET BY MOUTH DAILY   warfarin 5 MG tablet Commonly known as: COUMADIN Take as directed by the anticoagulation clinic. If you are unsure how to take this medication, talk to your nurse or doctor. Original instructions: TAKE 1 AND 1/2 TABLETS(7.5 MG) BY MOUTH DAILY        Objective:   BP (!) 145/76   Pulse 66   Ht 5\' 8"  (1.727 m)   Wt 182 lb (82.6 kg)   SpO2 96%   BMI 27.67 kg/m   Wt  Readings from Last 3 Encounters:  03/01/21 182 lb (82.6 kg)  02/15/21 180 lb 6.4 oz (81.8 kg)  01/18/21 182 lb 8 oz (82.8 kg)    Physical Exam Vitals and nursing note reviewed.  Cardiovascular:     Rate and Rhythm: Normal rate. Rhythm irregular.     Heart sounds: No murmur heard. No friction rub.  Pulmonary:     Effort: Pulmonary effort is normal. No respiratory distress.     Breath sounds: No wheezing or rhonchi.  Abdominal:     General: Abdomen is flat. Bowel sounds are normal. There is no distension.     Tenderness: There is no abdominal tenderness. There is no guarding or rebound.       Assessment & Plan:   Problem List Items Addressed This Visit      Cardiovascular and Mediastinum   Persistent atrial fibrillation (HCC)   Relevant Medications   metoprolol succinate (TOPROL-XL) 25 MG 24 hr tablet   warfarin (COUMADIN) 5 MG tablet   Other Relevant Orders   Magnesium     Other   High risk medication use    Other Visit Diagnoses    Atrial fibrillation, unspecified type ([place])    -  Primary   Relevant Medications   metoprolol succinate (TOPROL-XL) 25 MG 24 hr tablet   warfarin (COUMADIN) 5 MG tablet   Other Relevant Orders   CoaguChek XS/INR Waived   Magnesium   Chronic anticoagulation       Relevant Medications   metoprolol succinate (TOPROL-XL) 25 MG 24 hr tablet   Other Relevant Orders   CoaguChek XS/INR Waived   Magnesium   Need for shingles vaccine       Relevant Orders   Varicella-zoster vaccine IM (Shingrix) (Completed)   PAF (paroxysmal atrial fibrillation) (HCC)       Relevant Medications   metoprolol succinate (TOPROL-XL) 25 MG 24 hr tablet   warfarin (COUMADIN) 5 MG tablet   Other Relevant Orders   Magnesium   S/P colostomy ([place])       Relevant Orders   Ambulatory referral to Gastroenterology   Diarrhea, unspecified type       Relevant Orders   Ambulatory referral to Gastroenterology      Will refer to gastroenterology for persistent  off-and-on diarrhea especially with her colostomy in place.  She has had a colostomy for quite some time but has been having more diarrhea recently over the past 6 months.  Description   Decreased to take 5 mg all days.   INR 3.1 today. Goal 2.0-3.0,  Recheck in 6-8 weeks     Follow up plan: Return if symptoms worsen or fail to improve, for 6 to 8-week INR recheck.  Counseling provided for all of the vaccine components Orders Placed This Encounter  Procedures  . Varicella-zoster vaccine IM (Shingrix)  . CoaguChek XS/INR Waived  . Magnesium  . Ambulatory referral to Gastroenterology    [name], MD [facility] 03/01/2021, 2:06 PM

## 2021-03-02 LAB — MAGNESIUM: Magnesium: 2.2 mg/dL (ref 1.6–2.3)

## 2021-03-09 ENCOUNTER — Other Ambulatory Visit: Payer: Self-pay | Admitting: Cardiology

## 2021-03-15 ENCOUNTER — Other Ambulatory Visit: Payer: Self-pay | Admitting: Cardiology

## 2021-04-04 ENCOUNTER — Other Ambulatory Visit: Payer: Self-pay

## 2021-04-04 ENCOUNTER — Telehealth: Payer: Self-pay

## 2021-04-04 DIAGNOSIS — C50411 Malignant neoplasm of upper-outer quadrant of right female breast: Secondary | ICD-10-CM

## 2021-04-04 NOTE — Telephone Encounter (Signed)
Pt has appt scheduled at [facility] 04/05/21 at 0740. Pt is aware and verbalizes thanks.

## 2021-04-04 NOTE — Progress Notes (Signed)
breastPt returned call and accepted appt with Wilber Bihari, NP for 04/07/21 at 1030 with a 1000 lab appt. Orders placed and faxed to [facility] for pt to have diagnostic mammo. [facility] is not able to accommodate this until May 18th so I have called [facility] Judson Roch, scheduler) and LVM asking for an appt. Pt is aware of this and  verbalized thanks and understanding

## 2021-04-04 NOTE — Telephone Encounter (Signed)
Pt called and LVM stating she is experiencing new (L) breast discomfort and asks if she could have her mammo scheduled sooner than August. This LPN attempted to return pt's call to offer appt with Wilber Bihari, NP for 04/07/21 at 1030 and to send orders to [facility] for mammo before. Pt did not answer. LVM instructing pt to call back so we could get her scheduled.

## 2021-04-05 ENCOUNTER — Ambulatory Visit: Admission: RE | Admit: 2021-04-05 | Payer: Medicare Other | Source: Ambulatory Visit

## 2021-04-05 ENCOUNTER — Other Ambulatory Visit: Payer: Self-pay

## 2021-04-05 ENCOUNTER — Ambulatory Visit
Admission: RE | Admit: 2021-04-05 | Discharge: 2021-04-05 | Disposition: A | Discharge status: Home / Self Care | Payer: Medicare Other | Source: Ambulatory Visit | Attending: Oncology | Admitting: Oncology

## 2021-04-05 DIAGNOSIS — R922 Inconclusive mammogram: Secondary | ICD-10-CM | POA: Diagnosis not present

## 2021-04-05 DIAGNOSIS — Z170 Estrogen receptor positive status [ER+]: Secondary | ICD-10-CM

## 2021-04-06 ENCOUNTER — Telehealth: Payer: Self-pay

## 2021-04-06 ENCOUNTER — Telehealth: Payer: Self-pay | Admitting: Adult Health

## 2021-04-06 DIAGNOSIS — M79676 Pain in unspecified toe(s): Secondary | ICD-10-CM | POA: Diagnosis not present

## 2021-04-06 DIAGNOSIS — E1142 Type 2 diabetes mellitus with diabetic polyneuropathy: Secondary | ICD-10-CM | POA: Diagnosis not present

## 2021-04-06 DIAGNOSIS — L84 Corns and callosities: Secondary | ICD-10-CM | POA: Diagnosis not present

## 2021-04-06 DIAGNOSIS — B351 Tinea unguium: Secondary | ICD-10-CM | POA: Diagnosis not present

## 2021-04-06 NOTE — Telephone Encounter (Signed)
R/s appts per 4/28 sch msg. Called pt, no answer. Left msg with updated appts date and time.

## 2021-04-06 NOTE — Telephone Encounter (Signed)
Returned call to pt. Pt states Left breat mammogram was fine so she wanted to wait to come in until the end of  August/ after she has her next complete Mammogram. Message sent to scheduling for change.

## 2021-04-07 ENCOUNTER — Inpatient Hospital Stay: Payer: Medicare Other | Admitting: Adult Health

## 2021-04-07 ENCOUNTER — Inpatient Hospital Stay: Payer: Medicare Other

## 2021-04-13 DIAGNOSIS — C50911 Malignant neoplasm of unspecified site of right female breast: Secondary | ICD-10-CM | POA: Diagnosis not present

## 2021-04-13 DIAGNOSIS — M810 Age-related osteoporosis without current pathological fracture: Secondary | ICD-10-CM | POA: Diagnosis not present

## 2021-04-13 DIAGNOSIS — R5383 Other fatigue: Secondary | ICD-10-CM | POA: Diagnosis not present

## 2021-04-13 DIAGNOSIS — E663 Overweight: Secondary | ICD-10-CM | POA: Diagnosis not present

## 2021-04-13 DIAGNOSIS — Z6827 Body mass index (BMI) 27.0-27.9, adult: Secondary | ICD-10-CM | POA: Diagnosis not present

## 2021-04-13 DIAGNOSIS — M0589 Other rheumatoid arthritis with rheumatoid factor of multiple sites: Secondary | ICD-10-CM | POA: Diagnosis not present

## 2021-04-13 DIAGNOSIS — K5780 Diverticulitis of intestine, part unspecified, with perforation and abscess without bleeding: Secondary | ICD-10-CM | POA: Diagnosis not present

## 2021-04-13 DIAGNOSIS — I2510 Atherosclerotic heart disease of native coronary artery without angina pectoris: Secondary | ICD-10-CM | POA: Diagnosis not present

## 2021-04-13 DIAGNOSIS — Z79899 Other long term (current) drug therapy: Secondary | ICD-10-CM | POA: Diagnosis not present

## 2021-04-13 DIAGNOSIS — M0579 Rheumatoid arthritis with rheumatoid factor of multiple sites without organ or systems involvement: Secondary | ICD-10-CM | POA: Diagnosis not present

## 2021-04-17 ENCOUNTER — Encounter: Payer: Self-pay | Admitting: Gastroenterology

## 2021-04-17 ENCOUNTER — Other Ambulatory Visit: Payer: Self-pay

## 2021-04-17 ENCOUNTER — Ambulatory Visit (INDEPENDENT_AMBULATORY_CARE_PROVIDER_SITE_OTHER): Payer: Medicare Other | Admitting: Gastroenterology

## 2021-04-17 VITALS — BP 157/84 | HR 68 | Temp 97.6°F | Ht 68.0 in | Wt 183.2 lb

## 2021-04-17 DIAGNOSIS — R197 Diarrhea, unspecified: Secondary | ICD-10-CM | POA: Insufficient documentation

## 2021-04-17 DIAGNOSIS — Z933 Colostomy status: Secondary | ICD-10-CM | POA: Diagnosis not present

## 2021-04-17 DIAGNOSIS — I714 Abdominal aortic aneurysm, without rupture, unspecified: Secondary | ICD-10-CM

## 2021-04-17 NOTE — Patient Instructions (Signed)
1. Hold off on prunes for now. 2. You can continue other fruits/vegetables.  3. Start a good fiber supplement (3 to 4 grams of fiber per day). I recommend Benefiber powder or chewables OR Fiberchoice chewables. I would avoid fiber tablets/capsules. 4. Call in couple of weeks and let me know how you are doing. If you find that fiber makes symptoms worse, please stop and call me sooner.  5. When you see your PCP, discuss possibility of ultrasound to follow up on Abdominal Aortic Aneurysm.  6. We will see you back in 2 months for follow up, but if you are doing well you can cancel the appointment.

## 2021-04-17 NOTE — Progress Notes (Signed)
Primary Care Physician:  Dettinger, [name], MD  Primary Gastroenterologist: Formerly Dr. Oneida Alar.  Chief Complaint  Patient presents with  . Diarrhea    Once every 8 days     HPI:  Melody Casey is a 75 y.o. female here at the request of Dr. Warrick Parisian for further evaluation of persistent diarrhea with colostomy.  Patient underwent left colectomy, end transverse colostomy in August 2019 for perforated sigmoid colon felt to be due to perforated diverticulitis vs stercoral colitis.  Patient states that she did get evaluated for possible reversal with Dr. Leighton Ruff but she decided not to go through with it due to recovery time/rehabilitation.  She has not had post op colonoscopy and prefers to avoid if possible.  Presents today to discuss intermittent diarrhea.  She develops watery stools every 5 to 7 days.  Other days stool tends to be soft/mushy.  Denies melena or blood in the stool.  She has not been able to correlate her watery stools with anything that she is eating.  Limited dairy consumption, milk and coffee.  Tries to consume high-fiber diet.  Eats cantaloupe or watermelon every morning with breakfast and seems to tolerate.  She does mention eating up to 5 prunes at times to try to help eliminate stool.  When she develops watery stool, she typically takes 1 Imodium at bedtime to try to keep from developing ostomy leak during the night.  On a routine basis she typically has to get up in the melanite to empty her back.  She denies any abdominal pain.  Appetite is good.  No regular heartburn.  No vomiting.  Denies weight loss.   EGD November 2016: Dr. Carlyon Prows fields -Gastritis (mild chronic gastritis, no H. pylori) -1 gastric AVM  Colonoscopy July 2015: By Dr. Earle Gell -universal diverticulosis   CT abdomen pelvis with contrast August 2021:  IMPRESSION: 1. 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 Transverse colon diverticulosis is seen, however, there is  no evidence of no evidence of diverticulitis or other acute findings. 2. Stable 4.0 cm infrarenal abdominal aortic aneurysm. Recommend followup by [place] in 1 year. This recommendation follows ACR consensus guidelines: White Paper of the ACR Incidental Findings Committee II on Vascular Findings. J Am Coll Radiol 2013; 10:789-794. 3. Cholelithiasis. No radiographic evidence of cholecystitis. 4. Bilateral nephrolithiasis. Stable moderate right renal pelvicaliectasis, without evidence of ureteral calculi or dilatation. 5. Stable small calcified uterine fibroids.   Current Outpatient Medications  Medication Sig Dispense Refill  . alendronate (FOSAMAX) 70 MG tablet Take 70 mg by mouth once a week. Take with a full glass of water on an empty stomach.    . ALPRAZolam (XANAX) 0.25 MG tablet Take 1 tablet (0.25 mg total) by mouth 3 (three) times daily as needed for anxiety. 30 tablet 2  . amLODipine (NORVASC) 5 MG tablet TAKE 1 TABLET(5 MG) BY MOUTH DAILY 90 tablet 2  . atorvastatin (LIPITOR) 40 MG tablet TAKE 1 AND 1/2 TABLETS BY MOUTH EVERY DAY 45 tablet 11  . cholecalciferol (VITAMIN D3) 25 MCG (1000 UNIT) tablet Take 1,000 Units by mouth daily.    Marland Kitchen dofetilide (TIKOSYN) 250 MCG capsule TAKE ONE CAPSULE BY MOUTH TWICE A DAY 180 capsule 3  . hydroxychloroquine (PLAQUENIL) 200 MG tablet Take 1 tablet (200 mg total) by mouth 2 (two) times daily. 60 tablet 0  . leflunomide (ARAVA) 20 MG tablet Take 20 mg by mouth daily.    Marland Kitchen lisinopril (ZESTRIL) 20 MG tablet Take  1 tablet (20 mg total) by mouth daily. 90 tablet 3  . magnesium oxide (MAG-OX) 400 MG tablet Take 400 mg by mouth 2 (two) times daily.    . metoprolol succinate (TOPROL-XL) 25 MG 24 hr tablet TAKE 1 TABLET(25 MG) BY MOUTH DAILY 90 tablet 3  . potassium chloride (KLOR-CON) 10 MEQ tablet TAKE 1 TABLET BY MOUTH DAILY 240 tablet 2  . warfarin (COUMADIN) 5 MG tablet TAKE 1 AND 1/2 TABLETS(7.5 MG) BY MOUTH DAILY 135 tablet 3   No current  facility-administered medications for this visit.    Allergies as of 04/17/2021 - Review Complete 04/17/2021  Allergen Reaction Noted  . Miralax [polyethylene glycol] Swelling and Rash 06/14/2011  . Vancomycin Rash and Shortness Of Breath 08/18/2015  . Acetaminophen Hives   . Oxycodone-acetaminophen Itching   . Sulfa antibiotics Rash 10/24/2012  . Sulfacetamide sodium Rash 08/18/2015  . Sulfasalazine Rash 10/24/2012  . Banana Other (See Comments) 02/17/2014  . Latex Rash 03/19/2014  . Tape Rash 03/12/2014    Past Medical History:  Diagnosis Date  . AAA (abdominal aortic aneurysm) ([place])   . Acute diverticulitis   . Anxiety   . Arthritis   . Atrial fibrillation ([place])    a. Dx 03/2011 - on tikosyn/coumadin.  Marland Kitchen CAD (coronary artery disease)    a. NSTEMI 02/2011: occ mid Cx, DES to OM2, residual nonobst LAD dz.  . Cancer ([place])    breast  . Diabetes mellitus   . Diverticulitis   . Diverticulitis of colon     2008, 04/2011, 12/2014, 08/2015  . 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 Hemangioma    liver  . HLD (hyperlipidemia)   . HTN (hypertension)   . Osteoporosis   . Tobacco abuse    stopped smoking 2012  . Ureteral obstruction    History of gross hematuria/right hydronephrosis 2/2 to uteropelvic junction obstruction, s/p cystoscopy in January 2007 with bilateral retrograde pyelography, right ureter arthroscopy, right ureteral stent placement, bladder biopsies, stent removal since then.  . Wears dentures    top    Past Surgical History:  Procedure Laterality Date  . BIOPSY N/A 11/01/2015   Procedure: BIOPSY;  Surgeon: Danie Binder, MD;  Location: [facility];  Service: Endoscopy;  Laterality: N/A;  . BREAST LUMPECTOMY WITH NEEDLE LOCALIZATION AND AXILLARY SENTINEL LYMPH NODE BX Right 03/01/2014   Procedure: BREAST LUMPECTOMY WITH NEEDLE LOCALIZATION AND AXILLARY SENTINEL LYMPH NODE BX;  Surgeon: Edward Jolly, MD;  Location: [facility];  Service:  General;  Laterality: Right;  . BREAST SURGERY    . CARDIAC CATHETERIZATION  2012   stent  . COLON RESECTION N/A 07/25/2018   Procedure: COLOSTOMY;  Surgeon: Rolm Bookbinder, MD;  Location: [facility];  Service: General;  Laterality: N/A;  . COLONOSCOPY WITH PROPOFOL N/A 06/29/2014   Dr. Wynetta Emery: universal diverticulosis  . CORONARY STENT PLACEMENT  2012  . ESOPHAGOGASTRODUODENOSCOPY (EGD) WITH PROPOFOL N/A 11/01/2015   Procedure: ESOPHAGOGASTRODUODENOSCOPY (EGD) WITH PROPOFOL;  Surgeon: Danie Binder, MD;  Location: [facility];  Service: Endoscopy;  Laterality: N/A;  . KNEE ARTHROSCOPY     left  . LAPAROTOMY N/A 07/25/2018   Procedure: EXPLORATORY LAPAROTOMY;  Surgeon: Rolm Bookbinder, MD;  Location: [facility];  Service: General;  Laterality: N/A;  . PARTIAL COLECTOMY N/A 07/25/2018   Procedure: COLECTOMY;  Surgeon: Rolm Bookbinder, MD;  Location: [facility];  Service: General;  Laterality: N/A;  . Right leg surgery  age 27   As  a child for  ?scleroderma per patient  . TONSILLECTOMY AND ADENOIDECTOMY    . TUBAL LIGATION    . Ureteral surgery  2011   rt ureterostomy-stent    Family History  Problem Relation Age of Onset  . Lung cancer Father 9       deceased  . Cancer Father   . Heart failure Mother 40       deceased  . Heart disease Mother   . Diabetes Brother   . Hypertension Brother   . Cancer Brother        prostate  . Colon cancer Neg Hx   . Liver disease Neg Hx     Social History   Socioeconomic History  . Marital status: Married    Spouse name: Not on file  . Number of children: 2  . Years of education: Not on file  . Highest education level: Not on file  Occupational History  . 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 Smoking status: Former Smoker    Packs/day: 2.00    Years: 50.00    Pack years: 100.00    Types: Cigarettes    Quit date: 02/08/2011    Years since quitting: 10.1  . Smokeless tobacco: Never Used  Vaping Use  . Vaping Use: Never used   Substance and Sexual Activity  . Alcohol use: No    Alcohol/week: 0.0 standard drinks  . Drug use: No  . Sexual activity: Never  Other Topics Concern  . Not on file  Social History Narrative   Takes care 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    Social Determinants of Health   Financial Resource Strain: Not on file  Food Insecurity: Not on file  Transportation Needs: Not on file  Physical Activity: Not on file  Stress: Not on file  Social Connections: Not on file  Intimate Partner Violence: Not on file      ROS:  General: Negative for anorexia, weight loss, fever, chills, fatigue, weakness. Eyes: Negative for vision changes.  ENT: Negative for hoarseness, difficulty swallowing , nasal congestion. CV: Negative for chest pain, angina, palpitations, dyspnea on exertion, peripheral edema.  Respiratory: Negative for dyspnea at rest, dyspnea on exertion, cough, sputum, wheezing.  GI: See history of present illness. GU:  Negative for dysuria, hematuria, urinary incontinence, urinary frequency, nocturnal urination.  MS: Negative for joint pain, low back pain.  Derm: Negative for rash or itching.  Neuro: Negative for weakness, abnormal sensation, seizure, frequent headaches, memory loss, confusion.  Psych: Negative for anxiety, depression, suicidal ideation, hallucinations.  Endo: Negative for unusual weight change.  Heme: Negative for bruising or bleeding. Allergy: Negative for rash or hives.    Physical Examination:  BP (!) 157/84   Pulse 68   Temp 97.6 F (36.4 C)   Ht 5\' 8"  (1.727 m)   Wt 183 lb 3.2 oz (83.1 kg)   BMI 27.86 kg/m    General: Well-nourished, well-developed in no acute distress.  Head: Normocephalic, atraumatic.   Eyes: Conjunctiva pink, no icterus. Mouth: masked Neck: Supple without thyromegaly, masses, or lymphadenopathy.  Lungs: Clear to auscultation bilaterally.  Heart: Regular rate and rhythm, no murmurs rubs or gallops.  Abdomen: Bowel sounds  are normal, nontender, nondistended, no hepatosplenomegaly or masses, no abdominal bruits or    hernia , no rebound or guarding.  Ostomy with brown soft stool present Rectal: not performed Extremities: No lower extremity edema. No clubbing or deformities.  Neuro: Alert and oriented x  4 , grossly normal neurologically.  Skin: Warm and dry, no rash or jaundice.   Psych: Alert and cooperative, normal mood and affect.  Labs: Lab Results  Component Value Date   CREATININE 0.63 01/18/2021   BUN 15 01/18/2021   NA 142 01/18/2021   K 4.1 01/18/2021   CL 107 (H) 01/18/2021   CO2 20 01/18/2021   Lab Results  Component Value Date   WBC 4.2 01/18/2021   HGB 12.1 01/18/2021   HCT 36.9 01/18/2021   MCV 90 01/18/2021   PLT 148 (L) 01/18/2021   Lab Results  Component Value Date   ALT 11 01/18/2021   AST 15 01/18/2021   ALKPHOS 113 01/18/2021   BILITOT 0.2 01/18/2021   Lab Results  Component Value Date   TSH 2.790 05/23/2020      Imaging Studies: MM DIAG BREAST TOMO UNI LEFT  Result Date: 04/05/2021 CLINICAL DATA:  Patient presents for diagnostic left breast exam due to diffuse pain/tenderness. No focal palpable abnormality. EXAM: DIGITAL DIAGNOSTIC UNILATERAL LEFT MAMMOGRAM WITH TOMOSYNTHESIS AND CAD TECHNIQUE: Left digital diagnostic mammography and breast tomosynthesis was performed. The images were evaluated with computer-aided detection. COMPARISON:  Previous exam(s). ACR Breast Density Category b: There are scattered areas of fibroglandular density. FINDINGS: Exam demonstrates no focal abnormality within the left breast to account for patient's diffuse pain/tenderness. Exam is otherwise unchanged. IMPRESSION: Stable left breast mammogram. No abnormality to account for patient's diffuse pain/tenderness. RECOMMENDATION: Recommend continued management of patient's left breast pain/tenderness on a clinical basis. Otherwise, recommend continued annual screening mammographic follow-up of the  left breast. I have discussed the findings and recommendations with the patient. If applicable, a reminder letter will be sent to the patient regarding the next appointment. BI-RADS CATEGORY  1: Negative. Electronically Signed   By: Marin Olp M.D.   On: 04/05/2021 08:32     Assessment:  Pleasant 75 y/o female with history of left colectomy and end transverse colostomy in 07/2018 for perforated sigmoid colon presenting for further management of diarrhea.Last complete colonoscopy in July 2015 by Dr. Earle Gell, universal diverticulosis seen.  Patient notes intermittent watery stools every 5 to 7 days.  Typically responds to Imodium 2 mg.  She has not noticed any particular foods that seem to be associated with symptoms.  Doubt infectious etiology or microscopic colitis given intermittent symptoms. Suspect symptoms due to rapid transit from shortened colon or dietary related. Patient prefers conservative approach.  She is not interested in pursuing colonoscopy at this time.   Plan:  1. D/c prunes. Continue high fiber diet otherwise. 2. Start fiber supplement 3-4 grams daily (Benefiber powder/chewable or Fiberchoice chewable. No fiber tablets/capsules. 3. She will call in couple weeks with progress report or sooner if symptoms worsen. Would consider low dose imodium more scheduled at that time if persistent symptoms.  4. She will f/u with PCP for surveillance of AAA. She is overdue for surveillance U/S.  5. Will see her back in 2 months for follow up.

## 2021-04-19 ENCOUNTER — Ambulatory Visit: Payer: Medicare Other | Admitting: Nurse Practitioner

## 2021-04-19 NOTE — Progress Notes (Signed)
Cc'ed to pcp °

## 2021-04-26 ENCOUNTER — Other Ambulatory Visit: Payer: Self-pay | Admitting: Family Medicine

## 2021-04-26 DIAGNOSIS — I4819 Other persistent atrial fibrillation: Secondary | ICD-10-CM

## 2021-04-26 DIAGNOSIS — I480 Paroxysmal atrial fibrillation: Secondary | ICD-10-CM

## 2021-04-26 DIAGNOSIS — Z7901 Long term (current) use of anticoagulants: Secondary | ICD-10-CM

## 2021-04-27 ENCOUNTER — Ambulatory Visit (INDEPENDENT_AMBULATORY_CARE_PROVIDER_SITE_OTHER): Payer: Medicare Other | Admitting: Family Medicine

## 2021-04-27 ENCOUNTER — Other Ambulatory Visit: Payer: Self-pay

## 2021-04-27 ENCOUNTER — Encounter: Payer: Self-pay | Admitting: Family Medicine

## 2021-04-27 VITALS — BP 143/73 | HR 67 | Ht 68.0 in | Wt 188.0 lb

## 2021-04-27 DIAGNOSIS — I4891 Unspecified atrial fibrillation: Secondary | ICD-10-CM | POA: Diagnosis not present

## 2021-04-27 DIAGNOSIS — I714 Abdominal aortic aneurysm, without rupture, unspecified: Secondary | ICD-10-CM

## 2021-04-27 DIAGNOSIS — Z79899 Other long term (current) drug therapy: Secondary | ICD-10-CM | POA: Diagnosis not present

## 2021-04-27 DIAGNOSIS — I4819 Other persistent atrial fibrillation: Secondary | ICD-10-CM

## 2021-04-27 LAB — COAGUCHEK XS/INR WAIVED
INR: 2.2 — ABNORMAL HIGH (ref 0.9–1.1)
Prothrombin Time: 26.5 s

## 2021-04-27 NOTE — Progress Notes (Signed)
BP (!) 143/73   Pulse 67   Ht 5\' 8"  (1.727 m)   Wt 188 lb (85.3 kg)   SpO2 93%   BMI 28.59 kg/m    Subjective:   Patient ID: Melody Casey, female    DOB: Feb 08, 1946, 75 y.o.   MRN: [id number]  HPI: Melody Casey is a 75 y.o. female presenting on 04/27/2021 for Medical Management of Chronic Issues and Atrial Fibrillation   HPI Coumadin recheck Target goal: 2.0-3.0 Reason on anticoagulation: Chronic A. fib Patient denies any bruising or bleeding or chest pain or palpitations  Patient has a history of an aortic aneurysm, its been quite a few years since she had a recheck.  Denies any abdominal pain from anything related to it.  Relevant past medical, surgical, family and social history reviewed and updated as indicated. Interim medical history since our last visit reviewed. Allergies and medications reviewed and updated.  Review of Systems  Constitutional: Negative for chills and fever.  Eyes: Negative for redness and visual disturbance.  Respiratory: Negative for chest tightness and shortness of breath.   Cardiovascular: Negative for chest pain, palpitations and leg swelling.  Musculoskeletal: Negative for back pain and gait problem.  Skin: Negative for rash.  Neurological: Negative for light-headedness and headaches.  Psychiatric/Behavioral: Negative for agitation and behavioral problems.  All other systems reviewed and are negative.   Per HPI unless specifically indicated above   Allergies as of 04/27/2021      Reactions   Miralax [polyethylene Glycol] Swelling, Rash   Took [facility] brand developed rash. Patient states she tolerated name brand MiraLax in the past. 09/01/15. Patient states she had diffuse swelling including swelling of her lips.    Vancomycin Rash, Shortness Of Breath   Acetaminophen Hives   Oxycodone-acetaminophen Itching   Sulfa Antibiotics Rash   All over rash   Sulfacetamide Sodium Rash   All over rash   Sulfasalazine Rash   All over rash All over  rash   Banana Other (See Comments)   Unknown   Latex Rash   Tape Rash      Medication List       Accurate as of Apr 27, 2021  2:11 PM. If you have any questions, ask your nurse or doctor.        alendronate 70 MG tablet Commonly known as: FOSAMAX Take 70 mg by mouth once a week. Take with a full glass of water on an empty stomach.   ALPRAZolam 0.25 MG tablet Commonly known as: XANAX Take 1 tablet (0.25 mg total) by mouth 3 (three) times daily as needed for anxiety.   amLODipine 5 MG tablet Commonly known as: NORVASC TAKE 1 TABLET(5 MG) BY MOUTH DAILY   atorvastatin 40 MG tablet Commonly known as: LIPITOR TAKE 1 AND 1/2 TABLETS BY MOUTH EVERY DAY   cholecalciferol 25 MCG (1000 UNIT) tablet Commonly known as: VITAMIN D3 Take 1,000 Units by mouth daily.   dofetilide 250 MCG capsule Commonly known as: TIKOSYN TAKE ONE CAPSULE BY MOUTH TWICE A DAY   hydroxychloroquine 200 MG tablet Commonly known as: PLAQUENIL Take 1 tablet (200 mg total) by mouth 2 (two) times daily.   leflunomide 20 MG tablet Commonly known as: ARAVA Take 20 mg by mouth daily.   lisinopril 20 MG tablet Commonly known as: ZESTRIL Take 1 tablet (20 mg total) by mouth daily.   magnesium oxide 400 MG tablet Commonly known as: MAG-OX Take 400 mg by mouth 2 (two) times daily.  metoprolol succinate 25 MG 24 hr tablet Commonly known as: TOPROL-XL TAKE 1 TABLET(25 MG) BY MOUTH DAILY   potassium chloride 10 MEQ tablet Commonly known as: KLOR-CON TAKE 1 TABLET BY MOUTH DAILY   warfarin 5 MG tablet Commonly known as: COUMADIN Take as directed by the anticoagulation clinic. If you are unsure how to take this medication, talk to your nurse or doctor. Original instructions: TAKE 1 AND 1/2 TABLETS(7.5 MG) BY MOUTH DAILY        Objective:   BP (!) 143/73   Pulse 67   Ht 5\' 8"  (1.727 m)   Wt 188 lb (85.3 kg)   SpO2 93%   BMI 28.59 kg/m   Wt Readings from Last 3 Encounters:  04/27/21 188 lb  (85.3 kg)  04/17/21 183 lb 3.2 oz (83.1 kg)  03/01/21 182 lb (82.6 kg)    Physical Exam Vitals and nursing note reviewed.  Constitutional:      General: She is not in acute distress.    Appearance: She is well-developed. She is not diaphoretic.  Eyes:     Conjunctiva/sclera: Conjunctivae normal.  Cardiovascular:     Rate and Rhythm: Normal rate and regular rhythm.     Heart sounds: Normal heart sounds. No murmur heard.   Pulmonary:     Effort: Pulmonary effort is normal. No respiratory distress.     Breath sounds: Normal breath sounds. No wheezing.  Skin:    General: Skin is warm and dry.     Findings: No rash.  Neurological:     Mental Status: She is alert and oriented to person, place, and time.     Coordination: Coordination normal.  Psychiatric:        Behavior: Behavior normal.     Description   Continue to take 5 mg all days.   INR 2.2 today. Goal 2.0-3.0,  Recheck in 6-8 weeks      Assessment & Plan:   Problem List Items Addressed This Visit      Cardiovascular and Mediastinum   Persistent atrial fibrillation (H[facility])   AAA (abdominal aortic aneurysm) without rupture ([facility])   Relevant Orders   VAS [place] AAA DUPLEX     Other   High risk medication use    Other Visit Diagnoses    Atrial fibrillation, unspecified type ([place])    -  Primary   Relevant Orders   CoaguChek XS/INR Waived      Ordered a repeat ultrasound for the aorta. Follow up plan: Return if symptoms worsen or fail to improve, for 6-8-week INR and and diabetes.  Counseling provided for all of the vaccine components Orders Placed This Encounter  Procedures  . CoaguChek XS/INR Waived  . VAS [place] AAA [place] Jovanna Hodges, MD [facility] 04/27/2021, 2:11 PM

## 2021-06-05 ENCOUNTER — Telehealth: Payer: Self-pay | Admitting: Family Medicine

## 2021-06-05 DIAGNOSIS — M5136 Other intervertebral disc degeneration, lumbar region: Secondary | ICD-10-CM | POA: Diagnosis not present

## 2021-06-05 DIAGNOSIS — M9903 Segmental and somatic dysfunction of lumbar region: Secondary | ICD-10-CM | POA: Diagnosis not present

## 2021-06-05 NOTE — Telephone Encounter (Signed)
Pt is having issues with back. Went to the chiropractor today and they were unable to prescribe medication. She said that she has taken tramadol but it is not on her list. Pt also needs inr checked. PCP is booked and pt needs to be seen soon. Please call back and advise.

## 2021-06-05 NOTE — Telephone Encounter (Signed)
Pt is now scheduled 06/16/21 at 10:25 with Dettinger. We will discuss INR and back pain.  Tramadol is an old Rx from when she had her colostomy. Did not request refill today. Pt was informed that pt had to be seen in person for Tramadol. Dettinger has never prescribed it.

## 2021-06-06 ENCOUNTER — Encounter: Payer: Self-pay | Admitting: Internal Medicine

## 2021-06-06 DIAGNOSIS — M5136 Other intervertebral disc degeneration, lumbar region: Secondary | ICD-10-CM | POA: Diagnosis not present

## 2021-06-06 DIAGNOSIS — M9903 Segmental and somatic dysfunction of lumbar region: Secondary | ICD-10-CM | POA: Diagnosis not present

## 2021-06-08 DIAGNOSIS — M5136 Other intervertebral disc degeneration, lumbar region: Secondary | ICD-10-CM | POA: Diagnosis not present

## 2021-06-08 DIAGNOSIS — M9903 Segmental and somatic dysfunction of lumbar region: Secondary | ICD-10-CM | POA: Diagnosis not present

## 2021-06-13 DIAGNOSIS — M9903 Segmental and somatic dysfunction of lumbar region: Secondary | ICD-10-CM | POA: Diagnosis not present

## 2021-06-13 DIAGNOSIS — M5136 Other intervertebral disc degeneration, lumbar region: Secondary | ICD-10-CM | POA: Diagnosis not present

## 2021-06-15 DIAGNOSIS — M5136 Other intervertebral disc degeneration, lumbar region: Secondary | ICD-10-CM | POA: Diagnosis not present

## 2021-06-15 DIAGNOSIS — L84 Corns and callosities: Secondary | ICD-10-CM | POA: Diagnosis not present

## 2021-06-15 DIAGNOSIS — E1142 Type 2 diabetes mellitus with diabetic polyneuropathy: Secondary | ICD-10-CM | POA: Diagnosis not present

## 2021-06-15 DIAGNOSIS — M79676 Pain in unspecified toe(s): Secondary | ICD-10-CM | POA: Diagnosis not present

## 2021-06-15 DIAGNOSIS — M9903 Segmental and somatic dysfunction of lumbar region: Secondary | ICD-10-CM | POA: Diagnosis not present

## 2021-06-15 DIAGNOSIS — B351 Tinea unguium: Secondary | ICD-10-CM | POA: Diagnosis not present

## 2021-06-16 ENCOUNTER — Other Ambulatory Visit: Payer: Self-pay

## 2021-06-16 ENCOUNTER — Encounter: Payer: Self-pay | Admitting: Family Medicine

## 2021-06-16 ENCOUNTER — Ambulatory Visit (INDEPENDENT_AMBULATORY_CARE_PROVIDER_SITE_OTHER): Payer: Medicare Other | Admitting: Family Medicine

## 2021-06-16 VITALS — BP 149/59 | HR 65 | Ht 68.0 in | Wt 188.0 lb

## 2021-06-16 DIAGNOSIS — I4819 Other persistent atrial fibrillation: Secondary | ICD-10-CM | POA: Diagnosis not present

## 2021-06-16 DIAGNOSIS — Z79899 Other long term (current) drug therapy: Secondary | ICD-10-CM | POA: Diagnosis not present

## 2021-06-16 LAB — COAGUCHEK XS/INR WAIVED
INR: 1.4 — ABNORMAL HIGH (ref 0.9–1.1)
Prothrombin Time: 16.8 s

## 2021-06-16 NOTE — Progress Notes (Signed)
BP (!) 149/59   Pulse 65   Ht 5\' 8"  (1.727 m)   Wt 188 lb (85.3 kg)   SpO2 97%   BMI 28.59 kg/m    Subjective:   Patient ID: Melody Casey, female    DOB: 09/26/1946, 75 y.o.   MRN: [id number]  HPI: Melody Casey is a 75 y.o. female presenting on 06/16/2021 for Atrial Fibrillation   HPI Coumadin recheck Target goal: 2.0-3.0 Reason on anticoagulation: Chronic A. fib Patient denies any bruising or bleeding or chest pain or palpitations   Relevant past medical, surgical, family and social history reviewed and updated as indicated. Interim medical history since our last visit reviewed. Allergies and medications reviewed and updated.  Review of Systems  Constitutional:  Negative for chills and fever.  Eyes:  Negative for visual disturbance.  Respiratory:  Negative for chest tightness and shortness of breath.   Cardiovascular:  Negative for chest pain and leg swelling.  Musculoskeletal:  Positive for back pain (Patient has back pain but she is working with a Restaurant manager, fast food and it is improving.  Is been going on for 2 weeks.). Negative for gait problem.  Skin:  Negative for rash.  Neurological:  Negative for light-headedness and headaches.  Psychiatric/Behavioral:  Negative for agitation and behavioral problems.   All other systems reviewed and are negative.  Per HPI unless specifically indicated above   Allergies as of 06/16/2021       Reactions   Miralax [polyethylene Glycol] Swelling, Rash   Took [facility] brand developed rash. Patient states she tolerated name brand MiraLax in the past. 09/01/15. Patient states she had diffuse swelling including swelling of her lips.    Vancomycin Rash, Shortness Of Breath   Acetaminophen Hives   Oxycodone-acetaminophen Itching   Sulfa Antibiotics Rash   All over rash   Sulfacetamide Sodium Rash   All over rash   Sulfasalazine Rash   All over rash All over rash   Banana Other (See Comments)   Unknown   Latex Rash   Tape Rash         Medication List        Accurate as of June 16, 2021 10:36 AM. If you have any questions, ask your nurse or doctor.          alendronate 70 MG tablet Commonly known as: FOSAMAX Take 70 mg by mouth once a week. Take with a full glass of water on an empty stomach.   ALPRAZolam 0.25 MG tablet Commonly known as: XANAX Take 1 tablet (0.25 mg total) by mouth 3 (three) times daily as needed for anxiety.   amLODipine 5 MG tablet Commonly known as: NORVASC TAKE 1 TABLET(5 MG) BY MOUTH DAILY   atorvastatin 40 MG tablet Commonly known as: LIPITOR TAKE 1 AND 1/2 TABLETS BY MOUTH EVERY DAY   cholecalciferol 25 MCG (1000 UNIT) tablet Commonly known as: VITAMIN D3 Take 1,000 Units by mouth daily.   dofetilide 250 MCG capsule Commonly known as: TIKOSYN TAKE ONE CAPSULE BY MOUTH TWICE A DAY   hydroxychloroquine 200 MG tablet Commonly known as: PLAQUENIL Take 1 tablet (200 mg total) by mouth 2 (two) times daily.   leflunomide 20 MG tablet Commonly known as: ARAVA Take 20 mg by mouth daily.   lisinopril 20 MG tablet Commonly known as: ZESTRIL Take 1 tablet (20 mg total) by mouth daily.   magnesium oxide 400 MG tablet Commonly known as: MAG-OX Take 400 mg by mouth 2 (two) times daily.  metoprolol succinate 25 MG 24 hr tablet Commonly known as: TOPROL-XL TAKE 1 TABLET(25 MG) BY MOUTH DAILY   potassium chloride 10 MEQ tablet Commonly known as: KLOR-CON TAKE 1 TABLET BY MOUTH DAILY   warfarin 5 MG tablet Commonly known as: COUMADIN Take as directed by the anticoagulation clinic. If you are unsure how to take this medication, talk to your nurse or doctor. Original instructions: TAKE 1 AND 1/2 TABLETS(7.5 MG) BY MOUTH DAILY         Objective:   BP (!) 149/59   Pulse 65   Ht 5\' 8"  (1.727 m)   Wt 188 lb (85.3 kg)   SpO2 97%   BMI 28.59 kg/m   Wt Readings from Last 3 Encounters:  06/16/21 188 lb (85.3 kg)  04/27/21 188 lb (85.3 kg)  04/17/21 183 lb 3.2 oz (83.1  kg)    Physical Exam Vitals and nursing note reviewed.  Constitutional:      General: She is not in acute distress.    Appearance: She is well-developed. She is not diaphoretic.  Eyes:     Conjunctiva/sclera: Conjunctivae normal.  Cardiovascular:     Rate and Rhythm: Normal rate. Rhythm irregular.     Heart sounds: Normal heart sounds. No murmur heard. Pulmonary:     Effort: Pulmonary effort is normal. No respiratory distress.     Breath sounds: Normal breath sounds. No wheezing.  Skin:    General: Skin is warm and dry.     Findings: No rash.  Neurological:     Mental Status: She is alert and oriented to person, place, and time.     Coordination: Coordination normal.  Psychiatric:        Behavior: Behavior normal.    Results for orders placed or performed in visit on 04/27/21  CoaguChek XS/INR Waived  Result Value Ref Range   INR 2.2 (H) 0.9 - 1.1   Prothrombin Time 26.5 sec    Assessment & Plan:   Problem List Items Addressed This Visit       Cardiovascular and Mediastinum   Persistent atrial fibrillation ([place]) - Primary   Relevant Orders   CoaguChek XS/INR Waived     Other   High risk medication use    Description   Take 2 full tablets or 10 mg today and 7-1/2 mg or 1-1/2 tablets tomorrow and then continue to take 5 mg all days.   INR 1.4 today. Goal 2.0-3.0,  Recheck in 4 to 5 weeks weeks     Follow up plan: Return if symptoms worsen or fail to improve, for 4 to 5-week INR recheck.  Counseling provided for all of the vaccine components Orders Placed This Encounter  Procedures   CoaguChek XS/INR Dexter Georgana Romain, MD [facility] 06/16/2021, 10:36 AM

## 2021-06-25 ENCOUNTER — Other Ambulatory Visit: Payer: Self-pay | Admitting: Cardiology

## 2021-06-26 DIAGNOSIS — M5136 Other intervertebral disc degeneration, lumbar region: Secondary | ICD-10-CM | POA: Diagnosis not present

## 2021-06-26 DIAGNOSIS — M9903 Segmental and somatic dysfunction of lumbar region: Secondary | ICD-10-CM | POA: Diagnosis not present

## 2021-06-27 ENCOUNTER — Telehealth: Payer: Self-pay | Admitting: Adult Health

## 2021-06-27 NOTE — Telephone Encounter (Signed)
Scheduled appointment per provider. Patient is aware.

## 2021-07-03 DIAGNOSIS — M5136 Other intervertebral disc degeneration, lumbar region: Secondary | ICD-10-CM | POA: Diagnosis not present

## 2021-07-03 DIAGNOSIS — M9903 Segmental and somatic dysfunction of lumbar region: Secondary | ICD-10-CM | POA: Diagnosis not present

## 2021-07-10 DIAGNOSIS — M5136 Other intervertebral disc degeneration, lumbar region: Secondary | ICD-10-CM | POA: Diagnosis not present

## 2021-07-10 DIAGNOSIS — M9903 Segmental and somatic dysfunction of lumbar region: Secondary | ICD-10-CM | POA: Diagnosis not present

## 2021-07-12 ENCOUNTER — Other Ambulatory Visit: Payer: Medicare Other

## 2021-07-13 DIAGNOSIS — M0579 Rheumatoid arthritis with rheumatoid factor of multiple sites without organ or systems involvement: Secondary | ICD-10-CM | POA: Diagnosis not present

## 2021-07-13 DIAGNOSIS — I2510 Atherosclerotic heart disease of native coronary artery without angina pectoris: Secondary | ICD-10-CM | POA: Diagnosis not present

## 2021-07-13 DIAGNOSIS — R5383 Other fatigue: Secondary | ICD-10-CM | POA: Diagnosis not present

## 2021-07-13 DIAGNOSIS — M810 Age-related osteoporosis without current pathological fracture: Secondary | ICD-10-CM | POA: Diagnosis not present

## 2021-07-13 DIAGNOSIS — K5780 Diverticulitis of intestine, part unspecified, with perforation and abscess without bleeding: Secondary | ICD-10-CM | POA: Diagnosis not present

## 2021-07-13 DIAGNOSIS — C50911 Malignant neoplasm of unspecified site of right female breast: Secondary | ICD-10-CM | POA: Diagnosis not present

## 2021-07-13 DIAGNOSIS — Z6827 Body mass index (BMI) 27.0-27.9, adult: Secondary | ICD-10-CM | POA: Diagnosis not present

## 2021-07-13 DIAGNOSIS — E663 Overweight: Secondary | ICD-10-CM | POA: Diagnosis not present

## 2021-07-17 ENCOUNTER — Encounter: Payer: Self-pay | Admitting: Oncology

## 2021-07-17 DIAGNOSIS — Z1231 Encounter for screening mammogram for malignant neoplasm of breast: Secondary | ICD-10-CM | POA: Diagnosis not present

## 2021-07-24 ENCOUNTER — Other Ambulatory Visit: Payer: Self-pay

## 2021-07-24 ENCOUNTER — Ambulatory Visit (INDEPENDENT_AMBULATORY_CARE_PROVIDER_SITE_OTHER): Payer: Medicare Other | Admitting: Family Medicine

## 2021-07-24 ENCOUNTER — Encounter: Payer: Self-pay | Admitting: Family Medicine

## 2021-07-24 ENCOUNTER — Ambulatory Visit (INDEPENDENT_AMBULATORY_CARE_PROVIDER_SITE_OTHER): Payer: Medicare Other

## 2021-07-24 VITALS — BP 145/65 | HR 52 | Ht 68.0 in | Wt 183.0 lb

## 2021-07-24 DIAGNOSIS — M810 Age-related osteoporosis without current pathological fracture: Secondary | ICD-10-CM | POA: Diagnosis not present

## 2021-07-24 DIAGNOSIS — Z780 Asymptomatic menopausal state: Secondary | ICD-10-CM

## 2021-07-24 DIAGNOSIS — I4819 Other persistent atrial fibrillation: Secondary | ICD-10-CM | POA: Diagnosis not present

## 2021-07-24 DIAGNOSIS — R140 Abdominal distension (gaseous): Secondary | ICD-10-CM | POA: Diagnosis not present

## 2021-07-24 DIAGNOSIS — M85832 Other specified disorders of bone density and structure, left forearm: Secondary | ICD-10-CM | POA: Diagnosis not present

## 2021-07-24 LAB — COAGUCHEK XS/INR WAIVED
INR: 1.6 — ABNORMAL HIGH (ref 0.9–1.1)
Prothrombin Time: 28.6 s

## 2021-07-24 NOTE — Progress Notes (Signed)
BP (!) 145/65   Pulse (!) 52   Ht 5\' 8"  (1.727 m)   Wt 183 lb (83 kg)   SpO2 96%   BMI 27.83 kg/m    Subjective:   Patient ID: Melody Casey, female    DOB: 1946/06/11, 75 y.o.   MRN: [id number]  HPI: Melody Casey is a 75 y.o. female presenting on 07/24/2021 for Medical Management of Chronic Issues and Atrial Fibrillation   HPI Coumadin recheck Target goal: 2.0-3.0 Reason on anticoagulation: A. fib Patient denies any bruising or bleeding or chest pain or palpitations   Relevant past medical, surgical, family and social history reviewed and updated as indicated. Interim medical history since our last visit reviewed. Allergies and medications reviewed and updated.  Review of Systems  Constitutional:  Negative for chills and fever.  Eyes:  Negative for visual disturbance.  Respiratory:  Negative for chest tightness and shortness of breath.   Cardiovascular:  Negative for chest pain and leg swelling.  Gastrointestinal:  Positive for abdominal distention. Negative for abdominal pain, anal bleeding, blood in stool, constipation, diarrhea and vomiting.  Musculoskeletal:  Negative for back pain and gait problem.  Skin:  Negative for rash.  Neurological:  Negative for light-headedness and headaches.  Psychiatric/Behavioral:  Negative for agitation and behavioral problems.   All other systems reviewed and are negative.  Per HPI unless specifically indicated above   Allergies as of 07/24/2021       Reactions   Miralax [polyethylene Glycol] Swelling, Rash   Took [employer] brand developed rash. Patient states she tolerated name brand MiraLax in the past. 09/01/15. Patient states she had diffuse swelling including swelling of her lips.    Vancomycin Rash, Shortness Of Breath   Acetaminophen Hives   Oxycodone-acetaminophen Itching   Sulfa Antibiotics Rash   All over rash   Sulfacetamide Sodium Rash   All over rash   Sulfasalazine Rash   All over rash All over rash   Banana Other  (See Comments)   Unknown   Latex Rash   Tape Rash        Medication List        Accurate as of July 24, 2021 11:34 AM. If you have any questions, ask your nurse or doctor.          STOP taking these medications    alendronate 70 MG tablet Commonly known as: FOSAMAX Stopped by: [name] Ashyla Luth, MD   leflunomide 20 MG tablet Commonly known as: ARAVA Stopped by: [name] Kelli Robeck, MD       TAKE these medications    ALPRAZolam 0.25 MG tablet Commonly known as: XANAX Take 1 tablet (0.25 mg total) by mouth 3 (three) times daily as needed for anxiety.   amLODipine 5 MG tablet Commonly known as: NORVASC TAKE 1 TABLET(5 MG) BY MOUTH DAILY   atorvastatin 40 MG tablet Commonly known as: LIPITOR TAKE 1 AND 1/2 TABLETS BY MOUTH EVERY DAY   cholecalciferol 25 MCG (1000 UNIT) tablet Commonly known as: VITAMIN D3 Take 1,000 Units by mouth daily.   dofetilide 250 MCG capsule Commonly known as: TIKOSYN TAKE ONE CAPSULE BY MOUTH TWICE A DAY   hydroxychloroquine 200 MG tablet Commonly known as: PLAQUENIL Take 1 tablet (200 mg total) by mouth 2 (two) times daily.   lisinopril 20 MG tablet Commonly known as: ZESTRIL Take 1 tablet (20 mg total) by mouth daily.   magnesium oxide 400 MG tablet Commonly known as: MAG-OX Take 400 mg by  mouth 2 (two) times daily.   metoprolol succinate 25 MG 24 hr tablet Commonly known as: TOPROL-XL TAKE 1 TABLET(25 MG) BY MOUTH DAILY   potassium chloride 10 MEQ tablet Commonly known as: KLOR-CON TAKE 1 TABLET BY MOUTH DAILY   warfarin 5 MG tablet Commonly known as: COUMADIN Take as directed by the anticoagulation clinic. If you are unsure how to take this medication, talk to your nurse or doctor. Original instructions: TAKE 1 AND 1/2 TABLETS(7.5 MG) BY MOUTH DAILY         Objective:   BP (!) 145/65   Pulse (!) 52   Ht 5\' 8"  (1.727 m)   Wt 183 lb (83 kg)   SpO2 96%   BMI 27.83 kg/m   Wt Readings from Last 3  Encounters:  07/24/21 183 lb (83 kg)  06/16/21 188 lb (85.3 kg)  04/27/21 188 lb (85.3 kg)    Physical Exam Vitals and nursing note reviewed.  Constitutional:      General: She is not in acute distress.    Appearance: She is well-developed. She is not diaphoretic.  Eyes:     Conjunctiva/sclera: Conjunctivae normal.     Pupils: Pupils are equal, round, and reactive to light.  Cardiovascular:     Rate and Rhythm: Normal rate and regular rhythm.     Heart sounds: Normal heart sounds. No murmur heard. Pulmonary:     Effort: Pulmonary effort is normal. No respiratory distress.     Breath sounds: Normal breath sounds. No wheezing.  Abdominal:     General: Bowel sounds are normal. There is distension.     Palpations: Abdomen is soft. There is no shifting dullness, fluid wave, hepatomegaly, splenomegaly, mass or pulsatile mass.     Tenderness: There is no abdominal tenderness. There is no right CVA tenderness, left CVA tenderness, guarding or rebound.    Musculoskeletal:        General: No tenderness. Normal range of motion.  Skin:    General: Skin is warm and dry.     Findings: No rash.  Neurological:     Mental Status: She is alert and oriented to person, place, and time.     Coordination: Coordination normal.  Psychiatric:        Behavior: Behavior normal.    Description   Increase weekly dose to take 1/2 tablets or 7.5 mg on Mondays and Thursdays and take 5 mg all other days.   INR 1.6 today. Goal 2.0-3.0,  Recheck in 4 to 5 weeks weeks      Assessment & Plan:   Problem List Items Addressed This Visit       Cardiovascular and Mediastinum   Persistent atrial fibrillation ([place]) - Primary   Relevant Orders   CoaguChek XS/INR Waived   Other Visit Diagnoses     Postmenopausal       Relevant Orders   DG [facility] DEXA   Abdominal bloating           Patient is going for ultrasound paracentesis and asked them if they can work to make sure that there is nothing there  but on exam there is no pain and no masses palpable.  May need CT scan in the future if it continues to be an issue.  Her colostomy has been working fine and she is having good bowel movements and gas and everything coming out of it that should be.  Denies any blood Follow up plan: Return if symptoms worsen or fail to improve,  for 4 to 5-week INR and hypertension and cholesterol and blood work recheck.  Counseling provided for all of the vaccine components Orders Placed This Encounter  Procedures   DG [facility] DEXA   CoaguChek XS/INR Ulyses Southward, MD [facility] 07/24/2021, 11:34 AM

## 2021-07-26 DIAGNOSIS — M9903 Segmental and somatic dysfunction of lumbar region: Secondary | ICD-10-CM | POA: Diagnosis not present

## 2021-07-26 DIAGNOSIS — M5136 Other intervertebral disc degeneration, lumbar region: Secondary | ICD-10-CM | POA: Diagnosis not present

## 2021-07-27 ENCOUNTER — Other Ambulatory Visit: Payer: Self-pay

## 2021-07-27 ENCOUNTER — Inpatient Hospital Stay (HOSPITAL_BASED_OUTPATIENT_CLINIC_OR_DEPARTMENT_OTHER): Payer: Medicare Other | Admitting: Adult Health

## 2021-07-27 ENCOUNTER — Inpatient Hospital Stay: Payer: Medicare Other | Attending: Adult Health

## 2021-07-27 VITALS — BP 177/60 | HR 54 | Temp 97.7°F | Resp 18 | Ht 68.0 in | Wt 183.2 lb

## 2021-07-27 DIAGNOSIS — C50411 Malignant neoplasm of upper-outer quadrant of right female breast: Secondary | ICD-10-CM | POA: Diagnosis not present

## 2021-07-27 DIAGNOSIS — M810 Age-related osteoporosis without current pathological fracture: Secondary | ICD-10-CM | POA: Diagnosis not present

## 2021-07-27 DIAGNOSIS — Z170 Estrogen receptor positive status [ER+]: Secondary | ICD-10-CM

## 2021-07-27 DIAGNOSIS — Z853 Personal history of malignant neoplasm of breast: Secondary | ICD-10-CM | POA: Diagnosis not present

## 2021-07-27 LAB — COMPREHENSIVE METABOLIC PANEL
ALT: 16 U/L (ref 0–44)
AST: 20 U/L (ref 15–41)
Albumin: 3.5 g/dL (ref 3.5–5.0)
Alkaline Phosphatase: 123 U/L (ref 38–126)
Anion gap: 6 (ref 5–15)
BUN: 16 mg/dL (ref 8–23)
CO2: 25 mmol/L (ref 22–32)
Calcium: 10.4 mg/dL — ABNORMAL HIGH (ref 8.9–10.3)
Chloride: 106 mmol/L (ref 98–111)
Creatinine, Ser: 0.85 mg/dL (ref 0.44–1.00)
GFR, Estimated: >60 mL/min (ref >60)
Glucose, Bld: 111 mg/dL — ABNORMAL HIGH (ref 70–99)
Potassium: 4.2 mmol/L (ref 3.5–5.1)
Sodium: 137 mmol/L (ref 135–145)
Total Bilirubin: 0.4 mg/dL (ref 0.3–1.2)
Total Protein: 7.7 g/dL (ref 6.5–8.1)

## 2021-07-27 LAB — CBC WITH DIFFERENTIAL/PLATELET
Abs Immature Granulocytes: 0.01 10*3/uL (ref 0.00–0.07)
Basophils Absolute: 0 10*3/uL (ref 0.0–0.1)
Basophils Relative: 1 %
Eosinophils Absolute: 0.2 10*3/uL (ref 0.0–0.5)
Eosinophils Relative: 4 %
HCT: 36.6 % (ref 36.0–46.0)
Hemoglobin: 11.9 g/dL — ABNORMAL LOW (ref 12.0–15.0)
Immature Granulocytes: 0 %
Lymphocytes Relative: 25 %
Lymphs Abs: 1.1 10*3/uL (ref 0.7–4.0)
MCH: 29 pg (ref 26.0–34.0)
MCHC: 32.5 g/dL (ref 30.0–36.0)
MCV: 89.3 fL (ref 80.0–100.0)
Monocytes Absolute: 0.4 10*3/uL (ref 0.1–1.0)
Monocytes Relative: 8 %
Neutro Abs: 2.8 10*3/uL (ref 1.7–7.7)
Neutrophils Relative %: 62 %
Platelets: 174 10*3/uL (ref 150–400)
RBC: 4.1 MIL/uL (ref 3.87–5.11)
RDW: 14.3 % (ref 11.5–15.5)
WBC: 4.5 10*3/uL (ref 4.0–10.5)
nRBC: 0 % (ref 0.0–0.2)

## 2021-07-27 NOTE — Progress Notes (Signed)
CLINIC:  Survivorship   REASON FOR VISIT:  Routine follow-up for history of breast cancer.   BRIEF ONCOLOGIC HISTORY:  Per Dr. Virgie Dad note from 06/2018 75 y.o. [place] woman status post right breast upper outer quadrant biopsy  01/28/2014 for a clinical T1b N0, stage IA invasive ductal carcinoma, grade 1, estrogen and progesterone receptor positive, HER-2 not amplified, with an MIB-1 of 26%   (1) Biopsy of 2 additional suspicious areas in the right breast 02/03/2014 and 02/10/2014 showed only atypical ductal hyperplasia   (2) status post right lumpectomy and sentinel lymph node sampling 03/01/2014 showed an additional 3 mm area of invasive ductal carcinoma, grade 1, with negative margins. The single sentinel lymph node was negative.   (3) Opted against adjuvant radiation given the marginal benefit of local recurrence   (4) started anastrozole 03/29/2014, interrupted May 2016 with joint swelling; restarted 06/20/15             (a) osteoporosis with T score -3.5 on bone density scan 04/11/2015             (b) bone density on 04/30/2017 showed a T score of -3.7 osteoporosis  (c) Anastrozole completed in 03/2020   INTERVAL HISTORY:  Ms. Virgo presents to the [facility] today for routine follow-up for her history of breast cancer.  Overall, she reports feeling quite well.   She remains as active as she can and up to date with her PCP visits and cancer screenings.  She repeated bone density testing on 07/24/2021, her osteoporosis has improved from -4.2 to -3.6.  She continues on weekly fosamax but notes she occasionally misses a dose.  She notes she has had some abdominal distention and is f/u with Dr. Warrick Parisian about this soon.  If it isn't improved she may get a CT Scan to further evaluate.  Carlon is walking as she is able, and Is otherwise feeling well.    Teralyn underwent a bilateral breast screening mammogram on 07/19/2021 that showed no evidence of malignancy and breast  density category B.     REVIEW OF SYSTEMS:  Review of Systems  Constitutional:  Negative for appetite change, chills, fatigue, fever and unexpected weight change.  HENT:   Negative for hearing loss, lump/mass, mouth sores and trouble swallowing.   Eyes:  Negative for eye problems and icterus.  Respiratory:  Negative for chest tightness, cough and shortness of breath.   Cardiovascular:  Negative for chest pain, leg swelling and palpitations.  Gastrointestinal:  Negative for abdominal distention, abdominal pain, constipation, diarrhea, nausea and vomiting.  Endocrine: Negative for hot flashes.  Genitourinary:  Negative for difficulty urinating.   Musculoskeletal:  Negative for arthralgias.  Skin:  Negative for itching and rash.  Neurological:  Negative for dizziness, extremity weakness, headaches and numbness.  Hematological:  Negative for adenopathy. Does not bruise/bleed easily.  Psychiatric/Behavioral:  Negative for depression. The patient is not nervous/anxious.   Breast: Denies any new nodularity, masses, tenderness, nipple changes, or nipple discharge.       PAST MEDICAL/SURGICAL HISTORY:  Past Medical History:  Diagnosis Date   AAA (abdominal aortic aneurysm) ([place])    Acute diverticulitis    Anxiety    Arthritis    Atrial fibrillation ([place])    a. Dx 03/2011 - on tikosyn/coumadin.   CAD (coronary artery disease)    a. NSTEMI 02/2011: occ mid Cx, DES to OM2, residual nonobst LAD dz.   Cancer [facility])    breast   Diabetes mellitus  Diverticulitis    Diverticulitis of colon     2008, 04/2011, 12/2014, 08/2015   Foot deformity 1947   right; ??scleroderma   Hemangioma    liver   HLD (hyperlipidemia)    HTN (hypertension)    Osteoporosis    Tobacco abuse    stopped smoking 2012   Ureteral obstruction    History of gross hematuria/right hydronephrosis 2/2 to uteropelvic junction obstruction, s/p cystoscopy in January 2007 with bilateral retrograde pyelography, right ureter  arthroscopy, right ureteral stent placement, bladder biopsies, stent removal since then.   Wears dentures    top   Past Surgical History:  Procedure Laterality Date   BIOPSY N/A 11/01/2015   Procedure: BIOPSY;  Surgeon: Danie Binder, MD;  Location: [facility];  Service: Endoscopy;  Laterality: N/A;   BREAST LUMPECTOMY WITH NEEDLE LOCALIZATION AND AXILLARY SENTINEL LYMPH NODE BX Right 03/01/2014   Procedure: BREAST LUMPECTOMY WITH NEEDLE LOCALIZATION AND AXILLARY SENTINEL LYMPH NODE BX;  Surgeon: Edward Jolly, MD;  Location: [place];  Service: General;  Laterality: Right;   [place]  2012   stent   COLON RESECTION N/A 07/25/2018   Procedure: COLOSTOMY;  Surgeon: Rolm Bookbinder, MD;  Location: [place];  Service: General;  Laterality: N/A;   COLONOSCOPY WITH PROPOFOL N/A 06/29/2014   Dr. Wynetta Emery: universal diverticulosis   CORONARY STENT PLACEMENT  2012   ESOPHAGOGASTRODUODENOSCOPY (EGD) WITH PROPOFOL N/A 11/01/2015   Procedure: ESOPHAGOGASTRODUODENOSCOPY (EGD) WITH PROPOFOL;  Surgeon: Danie Binder, MD;  Location: [facility];  Service: Endoscopy;  Laterality: N/A;   KNEE ARTHROSCOPY     left   LAPAROTOMY N/A 07/25/2018   Procedure: EXPLORATORY LAPAROTOMY;  Surgeon: Rolm Bookbinder, MD;  Location: [place];  Service: General;  Laterality: N/A;   PARTIAL COLECTOMY N/A 07/25/2018   Procedure: COLECTOMY;  Surgeon: Rolm Bookbinder, MD;  Location: [place];  Service: General;  Laterality: N/A;   Right leg surgery  age 1   As a child for  ?scleroderma per patient   TONSILLECTOMY AND ADENOIDECTOMY     TUBAL LIGATION     Ureteral surgery  2011   rt ureterostomy-stent     ALLERGIES:  Allergies  Allergen Reactions   Miralax [Polyethylene Glycol] Swelling and Rash    Took [employer] brand developed rash. Patient states she tolerated name brand MiraLax in the past.  09/01/15. Patient states she had diffuse swelling including swelling of her lips.     Vancomycin Rash and Shortness Of Breath   Acetaminophen Hives   Oxycodone-Acetaminophen Itching   Sulfa Antibiotics Rash    All over rash   Sulfacetamide Sodium Rash    All over rash   Sulfasalazine Rash    All over rash All over rash   Banana Other (See Comments)    Unknown   Latex Rash   Tape Rash     CURRENT MEDICATIONS:  Outpatient Encounter Medications as of 07/27/2021  Medication Sig   ALPRAZolam (XANAX) 0.25 MG tablet Take 1 tablet (0.25 mg total) by mouth 3 (three) times daily as needed for anxiety.   amLODipine (NORVASC) 5 MG tablet TAKE 1 TABLET(5 MG) BY MOUTH DAILY   atorvastatin (LIPITOR) 40 MG tablet TAKE 1 AND 1/2 TABLETS BY MOUTH EVERY DAY   cholecalciferol (VITAMIN D3) 25 MCG (1000 UNIT) tablet Take 1,000 Units by mouth daily.   dofetilide (TIKOSYN) 250 MCG capsule TAKE ONE CAPSULE BY MOUTH TWICE A DAY   hydroxychloroquine (PLAQUENIL) 200  MG tablet Take 1 tablet (200 mg total) by mouth 2 (two) times daily.   lisinopril (ZESTRIL) 20 MG tablet Take 1 tablet (20 mg total) by mouth daily.   magnesium oxide (MAG-OX) 400 MG tablet Take 400 mg by mouth 2 (two) times daily.   metoprolol succinate (TOPROL-XL) 25 MG 24 hr tablet TAKE 1 TABLET(25 MG) BY MOUTH DAILY   potassium chloride (KLOR-CON) 10 MEQ tablet TAKE 1 TABLET BY MOUTH DAILY   warfarin (COUMADIN) 5 MG tablet TAKE 1 AND 1/2 TABLETS(7.5 MG) BY MOUTH DAILY   No facility-administered encounter medications on file as of 07/27/2021.     ONCOLOGIC FAMILY HISTORY:  Family History  Problem Relation Age of Onset   Lung cancer Father 15       deceased   Cancer Father    Heart failure Mother 43       deceased   Heart disease Mother    Diabetes Brother    Hypertension Brother    Cancer Brother        prostate   Colon cancer Neg Hx    Liver disease Neg Hx      SOCIAL HISTORY:  See above   PHYSICAL EXAMINATION:  Vital Signs: Vitals:   07/27/21 1358  BP: (!) 177/60  Pulse: (!) 54  Resp: 18  Temp:  97.7 F (36.5 C)  SpO2: 98%   Filed Weights   07/27/21 1358  Weight: 183 lb 3.2 oz (83.1 kg)   General: Well-nourished, well-appearing female in no acute distress.  Unaccompanied today.   HEENT: Head is normocephalic.  Pupils equal and reactive to light. Conjunctivae clear without exudate.  Sclerae anicteric. Oral mucosa is pink, moist.  Oropharynx is pink without lesions or erythema.  Lymph: No cervical, supraclavicular, or infraclavicular lymphadenopathy noted on palpation.  Cardiovascular: Regular rate and rhythm.Marland Kitchen Respiratory: Clear to auscultation bilaterally. Chest expansion symmetric; breathing non-labored.  Breast Exam:  -Left breast: No appreciable masses on palpation. No skin redness, thickening, or peau d'orange appearance; no nipple retraction or nipple discharge;   -Right breast: No appreciable masses on palpation. No skin redness, thickening, or peau d'orange appearance; no nipple retraction or nipple discharge; mild distortion in symmetry at previous lumpectomy site well healed scar without erythema or nodularity. -Axilla: No axillary adenopathy bilaterally.  GI: Abdomen soft and round; non-tender, non-distended. Bowel sounds normoactive. No hepatosplenomegaly.   GU: Deferred.  Neuro: No focal deficits. Steady gait.  Psych: Mood and affect normal and appropriate for situation.  MSK: No focal spinal tenderness to palpation, full range of motion in bilateral upper extremities Extremities: No edema. Skin: Warm and dry.  LABORATORY DATA:  None for this visit   DIAGNOSTIC IMAGING:  Most recent mammogram:  Due in 07/2020   ASSESSMENT AND PLAN:  Ms.. Righetti is a pleasant 75 y.o. female with history of Stage IA right breast invasive ductal carcinoma, ER+/PR+/HER2-, diagnosed in 01/2014, treated with lumpectomy, and anti-estrogen therapy with Anastrozole x 5 years completed therapy in 03/2020.  [facility] for surveillance and routine follow-up.   1.  History of breast cancer:  Ms. Cronk is currently clinically and radiographically without evidence of disease or recurrence of breast cancer. She will be due for mammogram in 07/2022. She will return in one year for continued long term surveillance.  I encouraged her to call me with any questions or concerns before her next visit at the cancer center, and I would be happy to see her sooner, if needed.  2. Bone health:  Given Ms. Reppond's age, history of breast cancer, and her previous anti-estrogen therapy with Anastrozole, she is at risk for bone demineralization. Her most recent bone density is improved.  She is no longer taking anastrozole, so I am hopeful it will continue to improve with calcium, vitamin d, weight bearing exercises, and fosamax.  She follows with Dr. Warrick Parisian for this.    3. Cancer screening:  Due to Ms. Mainer's history and her age, she should receive screening for skin cancers, colon cancer. She was encouraged to follow-up with her PCP for appropriate cancer screenings.   4. Health maintenance and wellness promotion: Ms. [place] was encouraged to consume 5-7 servings of fruits and vegetables per day. She was also encouraged to engage in moderate to vigorous exercise for 30 minutes per day most days of the week. She was instructed to limit her alcohol consumption and continue to abstain from tobacco use.    Dispo:  -Return to cancer center in one year for LTS follow up -Mammogram in 07/2022   Total encounter time: 20 minutes* in face to face visit time, chart review, lab review, and documentation of the encounter.   Gardenia Phlegm, NP [facility] [place] [phone]  *Total Encounter Time as defined by the Centers for Medicare and Medicaid Services includes, in addition to the face-to-face time of a patient visit (documented in the note above) non-face-to-face time: obtaining and reviewing outside history, ordering and reviewing  medications, tests or procedures, care coordination (communications with other health care professionals or caregivers) and documentation in the medical record.    Note: PRIMARY CARE PROVIDER [place], [name], MD [phone]

## 2021-07-28 ENCOUNTER — Telehealth: Payer: Self-pay | Admitting: Adult Health

## 2021-07-28 ENCOUNTER — Encounter: Payer: Self-pay | Admitting: Adult Health

## 2021-07-28 NOTE — Telephone Encounter (Signed)
Attempted to scheduled appointment per 08/18 los. Patient said she does not want to schedule appointment yet. Will call back to schedule appointment.

## 2021-08-03 ENCOUNTER — Other Ambulatory Visit: Payer: Medicare Other

## 2021-08-03 ENCOUNTER — Ambulatory Visit: Payer: Medicare Other | Admitting: Adult Health

## 2021-08-04 ENCOUNTER — Other Ambulatory Visit: Payer: Self-pay | Admitting: Cardiology

## 2021-08-08 DIAGNOSIS — M1712 Unilateral primary osteoarthritis, left knee: Secondary | ICD-10-CM | POA: Diagnosis not present

## 2021-08-09 DIAGNOSIS — M9903 Segmental and somatic dysfunction of lumbar region: Secondary | ICD-10-CM | POA: Diagnosis not present

## 2021-08-09 DIAGNOSIS — M5136 Other intervertebral disc degeneration, lumbar region: Secondary | ICD-10-CM | POA: Diagnosis not present

## 2021-08-17 DIAGNOSIS — M79676 Pain in unspecified toe(s): Secondary | ICD-10-CM | POA: Diagnosis not present

## 2021-08-17 DIAGNOSIS — E1142 Type 2 diabetes mellitus with diabetic polyneuropathy: Secondary | ICD-10-CM | POA: Diagnosis not present

## 2021-08-17 DIAGNOSIS — B351 Tinea unguium: Secondary | ICD-10-CM | POA: Diagnosis not present

## 2021-08-17 DIAGNOSIS — L84 Corns and callosities: Secondary | ICD-10-CM | POA: Diagnosis not present

## 2021-08-25 ENCOUNTER — Other Ambulatory Visit: Payer: Self-pay

## 2021-08-25 ENCOUNTER — Encounter: Payer: Self-pay | Admitting: Family Medicine

## 2021-08-25 ENCOUNTER — Ambulatory Visit (INDEPENDENT_AMBULATORY_CARE_PROVIDER_SITE_OTHER): Payer: Medicare Other | Admitting: Family Medicine

## 2021-08-25 VITALS — BP 138/68 | HR 54 | Temp 98.0°F | Ht 68.0 in | Wt 185.4 lb

## 2021-08-25 DIAGNOSIS — Z79899 Other long term (current) drug therapy: Secondary | ICD-10-CM

## 2021-08-25 DIAGNOSIS — Z23 Encounter for immunization: Secondary | ICD-10-CM | POA: Diagnosis not present

## 2021-08-25 DIAGNOSIS — E118 Type 2 diabetes mellitus with unspecified complications: Secondary | ICD-10-CM | POA: Diagnosis not present

## 2021-08-25 DIAGNOSIS — I4819 Other persistent atrial fibrillation: Secondary | ICD-10-CM

## 2021-08-25 DIAGNOSIS — I10 Essential (primary) hypertension: Secondary | ICD-10-CM | POA: Diagnosis not present

## 2021-08-25 DIAGNOSIS — E785 Hyperlipidemia, unspecified: Secondary | ICD-10-CM

## 2021-08-25 LAB — COAGUCHEK XS/INR WAIVED
INR: 1.9 — ABNORMAL HIGH (ref 0.9–1.1)
Prothrombin Time: 23.2 s

## 2021-08-25 LAB — BAYER DCA HB A1C WAIVED: HB A1C (BAYER DCA - WAIVED): 6.5 % — ABNORMAL HIGH (ref 4.8–5.6)

## 2021-08-25 NOTE — Progress Notes (Signed)
BP 138/68   Pulse (!) 54   Temp 98 F (36.7 C) (Temporal)   Ht _0  (1.727 m)   Wt 185 lb 6 oz (84.1 kg)   BMI 28.19 kg/m    Subjective:   Patient ID: Melody Casey, female    DOB: [birth year], 75 y.o.   MRN: [id number]  HPI: Melody Casey is a 75 y.o. female presenting on 08/25/2021 for Medical Management of Chronic Issues, Hyperlipidemia, and Atrial Fibrillation   HPI Type 2 diabetes mellitus Patient comes in today for recheck of his diabetes. Patient has been currently taking no medication currently. Patient is currently on an ACE inhibitor/ARB. Patient has not seen an ophthalmologist this year. Patient denies any issues with their feet. The symptom started onset as an adult hypertension and hyperlipidemia ARE RELATED TO DM   Hypertension Patient is currently on amlodipine and lisinopril and metoprolol, and their blood pressure today is 138/68. Patient denies any lightheadedness or dizziness. Patient denies headaches, blurred vision, chest pains, shortness of breath, or weakness. Denies any side effects from medication and is content with current medication.   Hyperlipidemia Patient is coming in for recheck of his hyperlipidemia. The patient is currently taking atorvastatin. They deny any issues with myalgias or history of liver damage from it. They deny any focal numbness or weakness or chest pain.   Coumadin recheck Target goal: 2.0-3.0 Reason on anticoagulation: Persistent A. fib Patient denies any bruising or bleeding or chest pain or palpitations   Relevant past medical, surgical, family and social history reviewed and updated as indicated. Interim medical history since our last visit reviewed. Allergies and medications reviewed and updated.  Review of Systems  Constitutional:  Negative for chills and fever.  Eyes:  Negative for visual disturbance.  Respiratory:  Negative for chest tightness and shortness of breath.   Cardiovascular:  Negative for chest pain and leg  swelling.  Skin:  Negative for rash.  Neurological:  Negative for light-headedness and headaches.  Psychiatric/Behavioral:  Negative for agitation and behavioral problems.   All other systems reviewed and are negative.  Per HPI unless specifically indicated above   Allergies as of 08/25/2021       Reactions   Miralax [polyethylene Glycol] Swelling, Rash   Took [facility] brand developed rash. Patient states she tolerated name brand MiraLax in the past. 09/01/15. Patient states she had diffuse swelling including swelling of her lips.    Vancomycin Rash, Shortness Of Breath   Acetaminophen Hives   Oxycodone-acetaminophen Itching   Sulfa Antibiotics Rash   All over rash   Sulfacetamide Sodium Rash   All over rash   Sulfasalazine Rash   All over rash All over rash   Banana Other (See Comments)   Unknown   Latex Rash   Tape Rash        Medication List        Accurate as of August 25, 2021  2:08 PM. If you have any questions, ask your nurse or doctor.          ALPRAZolam 0.25 MG tablet Commonly known as: XANAX Take 1 tablet (0.25 mg total) by mouth 3 (three) times daily as needed for anxiety.   amLODipine 5 MG tablet Commonly known as: NORVASC TAKE 1 TABLET(5 MG) BY MOUTH DAILY   atorvastatin 40 MG tablet Commonly known as: LIPITOR TAKE 1 AND 1/2 TABLETS BY MOUTH EVERY DAY   cholecalciferol 25 MCG (1000 UNIT) tablet Commonly known as: VITAMIN D3 Take 1,000  Units by mouth daily.   dofetilide 250 MCG capsule Commonly known as: TIKOSYN TAKE ONE CAPSULE BY MOUTH TWICE A DAY   hydroxychloroquine 200 MG tablet Commonly known as: PLAQUENIL Take 1 tablet (200 mg total) by mouth 2 (two) times daily.   lisinopril 20 MG tablet Commonly known as: ZESTRIL Take 1 tablet (20 mg total) by mouth daily.   magnesium oxide 400 MG tablet Commonly known as: MAG-OX Take 400 mg by mouth 2 (two) times daily.   metoprolol succinate 25 MG 24 hr tablet Commonly known as:  TOPROL-XL TAKE 1 TABLET(25 MG) BY MOUTH DAILY   potassium chloride 10 MEQ tablet Commonly known as: KLOR-CON TAKE 1 TABLET BY MOUTH DAILY   warfarin 5 MG tablet Commonly known as: COUMADIN Take as directed by the anticoagulation clinic. If you are unsure how to take this medication, talk to your nurse or doctor. Original instructions: TAKE 1 AND 1/2 TABLETS(7.5 MG) BY MOUTH DAILY         Objective:   BP 138/68   Pulse (!) 54   Temp 98 F (36.7 C) (Temporal)   Ht _0  (1.727 m)   Wt 185 lb 6 oz (84.1 kg)   BMI 28.19 kg/m   Wt Readings from Last 3 Encounters:  08/25/21 185 lb 6 oz (84.1 kg)  07/27/21 183 lb 3.2 oz (83.1 kg)  07/24/21 183 lb (83 kg)    Physical Exam Vitals and nursing note reviewed.  Constitutional:      General: She is not in acute distress.    Appearance: She is well-developed. She is not diaphoretic.  Eyes:     Conjunctiva/sclera: Conjunctivae normal.  Cardiovascular:     Rate and Rhythm: Normal rate and regular rhythm.     Heart sounds: Normal heart sounds. No murmur heard. Pulmonary:     Effort: Pulmonary effort is normal. No respiratory distress.     Breath sounds: Normal breath sounds. No wheezing.  Musculoskeletal:        General: No tenderness. Normal range of motion.  Skin:    General: Skin is warm and dry.     Findings: No rash.  Neurological:     Mental Status: She is alert and oriented to person, place, and time.     Coordination: Coordination normal.  Psychiatric:        Behavior: Behavior normal.      Assessment & Plan:   Problem List Items Addressed This Visit       Cardiovascular and Mediastinum   Essential hypertension   Relevant Orders   CBC with Differential/Platelet   CMP14+EGFR   Persistent atrial fibrillation (HCC) - Primary   Relevant Orders   CoaguChek XS/INR Waived     Endocrine   Type 2 diabetes mellitus with complication, without long-term current use of insulin (HCC)     Other   Dyslipidemia, goal  LDL below 70   Relevant Orders   Lipid panel   High risk medication use    Description   continue weekly dose to take 1/2 tablets or 7.5 mg on Mondays and Thursdays and take 5 mg all other days.   INR 1.9 today. Goal 2.0-3.0, improved, no change for now  Recheck in 4 to 5 weeks weeks     Follow up plan: Return in about 3 months (around 11/24/2021), or if symptoms worsen or fail to improve, for dm and htn and hld.  Counseling provided for all of the vaccine components Orders Placed This Encounter  Procedures   CBC with Differential/Platelet   CMP14+EGFR   Lipid panel   CoaguChek XS/INR Ulyses Southward, MD [facility] 08/25/2021, 2:08 PM

## 2021-08-26 LAB — CBC WITH DIFFERENTIAL/PLATELET
Basophils Absolute: 0 10*3/uL (ref 0.0–0.2)
Basos: 1 %
EOS (ABSOLUTE): 0.1 10*3/uL (ref 0.0–0.4)
Eos: 2 %
Hematocrit: 36.5 % (ref 34.0–46.6)
Hemoglobin: 11.8 g/dL (ref 11.1–15.9)
Immature Grans (Abs): 0 10*3/uL (ref 0.0–0.1)
Immature Granulocytes: 0 %
Lymphocytes Absolute: 1.2 10*3/uL (ref 0.7–3.1)
Lymphs: 22 %
MCH: 28.6 pg (ref 26.6–33.0)
MCHC: 32.3 g/dL (ref 31.5–35.7)
MCV: 88 fL (ref 79–97)
Monocytes Absolute: 0.4 10*3/uL (ref 0.1–0.9)
Monocytes: 7 %
Neutrophils Absolute: 3.7 10*3/uL (ref 1.4–7.0)
Neutrophils: 68 %
Platelets: 185 10*3/uL (ref 150–450)
RBC: 4.13 x10E6/uL (ref 3.77–5.28)
RDW: 14.3 % (ref 11.7–15.4)
WBC: 5.4 10*3/uL (ref 3.4–10.8)

## 2021-08-26 LAB — CMP14+EGFR
ALT: 11 IU/L (ref 0–32)
AST: 14 IU/L (ref 0–40)
Albumin/Globulin Ratio: 1.3 (ref 1.2–2.2)
Albumin: 3.7 g/dL (ref 3.7–4.7)
Alkaline Phosphatase: 120 IU/L (ref 44–121)
BUN/Creatinine Ratio: 24 (ref 12–28)
BUN: 18 mg/dL (ref 8–27)
Bilirubin Total: 0.3 mg/dL (ref 0.0–1.2)
CO2: 24 mmol/L (ref 20–29)
Calcium: 10.2 mg/dL (ref 8.7–10.3)
Chloride: 105 mmol/L (ref 96–106)
Creatinine, Ser: 0.74 mg/dL (ref 0.57–1.00)
Globulin, Total: 2.9 g/dL (ref 1.5–4.5)
Glucose: 170 mg/dL — ABNORMAL HIGH (ref 65–99)
Potassium: 4.6 mmol/L (ref 3.5–5.2)
Sodium: 138 mmol/L (ref 134–144)
Total Protein: 6.6 g/dL (ref 6.0–8.5)
eGFR: 83 mL/min/{1.73_m2} (ref >59)

## 2021-08-26 LAB — LIPID PANEL
Chol/HDL Ratio: 3.3 ratio (ref 0.0–4.4)
Cholesterol, Total: 150 mg/dL (ref 100–199)
HDL: 45 mg/dL (ref >39)
LDL Chol Calc (NIH): 73 mg/dL (ref 0–99)
Triglycerides: 189 mg/dL — ABNORMAL HIGH (ref 0–149)
VLDL Cholesterol Cal: 32 mg/dL (ref 5–40)

## 2021-08-31 ENCOUNTER — Ambulatory Visit (INDEPENDENT_AMBULATORY_CARE_PROVIDER_SITE_OTHER): Payer: Medicare Other

## 2021-08-31 DIAGNOSIS — I714 Abdominal aortic aneurysm, without rupture, unspecified: Secondary | ICD-10-CM

## 2021-09-18 ENCOUNTER — Other Ambulatory Visit: Payer: Self-pay | Admitting: Family Medicine

## 2021-09-18 DIAGNOSIS — F411 Generalized anxiety disorder: Secondary | ICD-10-CM

## 2021-10-03 ENCOUNTER — Other Ambulatory Visit: Payer: Self-pay

## 2021-10-03 DIAGNOSIS — I4819 Other persistent atrial fibrillation: Secondary | ICD-10-CM

## 2021-10-04 ENCOUNTER — Encounter: Payer: Self-pay | Admitting: Family Medicine

## 2021-10-04 ENCOUNTER — Other Ambulatory Visit: Payer: Self-pay

## 2021-10-04 ENCOUNTER — Ambulatory Visit (INDEPENDENT_AMBULATORY_CARE_PROVIDER_SITE_OTHER): Payer: Medicare Other | Admitting: Family Medicine

## 2021-10-04 VITALS — BP 158/80 | HR 47 | Ht 68.0 in | Wt 185.0 lb

## 2021-10-04 DIAGNOSIS — F419 Anxiety disorder, unspecified: Secondary | ICD-10-CM | POA: Diagnosis not present

## 2021-10-04 DIAGNOSIS — I4819 Other persistent atrial fibrillation: Secondary | ICD-10-CM | POA: Diagnosis not present

## 2021-10-04 DIAGNOSIS — F32A Depression, unspecified: Secondary | ICD-10-CM

## 2021-10-04 DIAGNOSIS — Z79899 Other long term (current) drug therapy: Secondary | ICD-10-CM | POA: Diagnosis not present

## 2021-10-04 LAB — COAGUCHEK XS/INR WAIVED
INR: 2.4 — ABNORMAL HIGH (ref 0.9–1.1)
Prothrombin Time: 29 s

## 2021-10-04 MED ORDER — LISINOPRIL 20 MG PO TABS: 20.0000 mg | ORAL_TABLET | Freq: Every day | ORAL | 3 refills | Status: DC

## 2021-10-04 NOTE — Progress Notes (Signed)
BP (!) 158/80   Pulse (!) 47   Ht 5\' 8"  (1.727 m)   Wt 185 lb (83.9 kg)   SpO2 93%   BMI 28.13 kg/m    Subjective:   Patient ID: Melody Casey, female    DOB: 08-May-1946, 75 y.o.   MRN: [id number]  HPI: Melody Casey is a 75 y.o. female presenting on 10/04/2021 for Medical Management of Chronic Issues and Atrial Fibrillation   HPI Coumadin recheck Target goal: 2.0-3.0 Reason on anticoagulation: Chronic A. fib Patient denies any bruising or bleeding or chest pain or palpitations   Anxiety Current rx-Xanax 25 mg 3 times daily as needed # meds rx-30 Effectiveness of current meds-works well, only uses rarely Adverse reactions form meds-none  Pill count performed-No Last drug screen -10/21/2020 ( high risk q86m, moderate risk q80m, low risk yearly ) Urine drug screen today- Yes Was the [place] reviewed-yes  If yes were their any concerning findings? -None  No flowsheet data found.   Controlled substance contract signed on: Today  Relevant past medical, surgical, family and social history reviewed and updated as indicated. Interim medical history since our last visit reviewed. Allergies and medications reviewed and updated.  Review of Systems  Constitutional:  Negative for chills and fever.  Eyes:  Negative for visual disturbance.  Respiratory:  Negative for chest tightness and shortness of breath.   Cardiovascular:  Negative for chest pain and leg swelling.  Musculoskeletal:  Negative for back pain and gait problem.  Skin:  Negative for rash.  Neurological:  Negative for light-headedness and headaches.  Psychiatric/Behavioral:  Negative for agitation and behavioral problems.   All other systems reviewed and are negative.  Per HPI unless specifically indicated above   Allergies as of 10/04/2021       Reactions   Miralax [polyethylene Glycol] Swelling, Rash   Took [facility] brand developed rash. Patient states she tolerated name brand MiraLax in the past. 09/01/15.  Patient states she had diffuse swelling including swelling of her lips.    Vancomycin Rash, Shortness Of Breath   Acetaminophen Hives   Oxycodone-acetaminophen Itching   Sulfa Antibiotics Rash   All over rash   Sulfacetamide Sodium Rash   All over rash   Sulfasalazine Rash   All over rash All over rash   Banana Other (See Comments)   Unknown   Latex Rash   Tape Rash        Medication List        Accurate as of October 04, 2021  2:47 PM. If you have any questions, ask your nurse or doctor.          ALPRAZolam 0.25 MG tablet Commonly known as: XANAX Take 1 tablet (0.25 mg total) by mouth 3 (three) times daily as needed for anxiety.   amLODipine 5 MG tablet Commonly known as: NORVASC TAKE 1 TABLET(5 MG) BY MOUTH DAILY   atorvastatin 40 MG tablet Commonly known as: LIPITOR TAKE 1 AND 1/2 TABLETS BY MOUTH EVERY DAY   cholecalciferol 25 MCG (1000 UNIT) tablet Commonly known as: VITAMIN D3 Take 1,000 Units by mouth daily.   dofetilide 250 MCG capsule Commonly known as: TIKOSYN TAKE ONE CAPSULE BY MOUTH TWICE A DAY   hydroxychloroquine 200 MG tablet Commonly known as: PLAQUENIL Take 1 tablet (200 mg total) by mouth 2 (two) times daily.   lisinopril 20 MG tablet Commonly known as: ZESTRIL Take 1 tablet (20 mg total) by mouth daily.   magnesium oxide 400 MG  tablet Commonly known as: MAG-OX Take 400 mg by mouth 2 (two) times daily.   metoprolol succinate 25 MG 24 hr tablet Commonly known as: TOPROL-XL TAKE 1 TABLET(25 MG) BY MOUTH DAILY   potassium chloride 10 MEQ tablet Commonly known as: KLOR-CON TAKE 1 TABLET BY MOUTH DAILY   warfarin 5 MG tablet Commonly known as: COUMADIN Take as directed by the anticoagulation clinic. If you are unsure how to take this medication, talk to your nurse or doctor. Original instructions: TAKE 1 AND 1/2 TABLETS(7.5 MG) BY MOUTH DAILY         Objective:   BP (!) 158/80   Pulse (!) 47   Ht 5\' 8"  (1.727 m)   Wt  185 lb (83.9 kg)   SpO2 93%   BMI 28.13 kg/m   Wt Readings from Last 3 Encounters:  10/04/21 185 lb (83.9 kg)  08/25/21 185 lb 6 oz (84.1 kg)  07/27/21 183 lb 3.2 oz (83.1 kg)    Physical Exam Vitals and nursing note reviewed.  Constitutional:      General: She is not in acute distress.    Appearance: She is well-developed. She is not diaphoretic.  Eyes:     Conjunctiva/sclera: Conjunctivae normal.  Cardiovascular:     Rate and Rhythm: Normal rate and regular rhythm.     Heart sounds: Normal heart sounds. No murmur heard. Pulmonary:     Effort: Pulmonary effort is normal. No respiratory distress.     Breath sounds: Normal breath sounds. No wheezing.  Musculoskeletal:        General: No tenderness. Normal range of motion.  Skin:    General: Skin is warm and dry.     Findings: No rash.  Neurological:     Mental Status: She is alert and oriented to person, place, and time.     Coordination: Coordination normal.  Psychiatric:        Behavior: Behavior normal.     Description   continue weekly dose to take 1/2 tablets or 7.5 mg on Mondays and Thursdays and take 5 mg all other days.   INR 2.4 today. Goal 2.0-3.0, improved, no change for now  Recheck in 6-8 weeks     Assessment & Plan:   Problem List Items Addressed This Visit       Cardiovascular and Mediastinum   Persistent atrial fibrillation (HCC)   Relevant Medications   lisinopril (ZESTRIL) 20 MG tablet   Other Relevant Orders   ToxASSURE Select 13 (MW), Urine     Other   High risk medication use   Anxiety and depression   Other Visit Diagnoses     Controlled substance agreement signed    -  Primary   Relevant Orders   ToxASSURE Select 13 (MW), Urine       Patient only takes occasional Xanax, will do tox assure today.  Unable to leave urine today, instructed her that she can do it next time.  She is an occasional user and rarely gets even 1 prescription filled a year.  Description   continue  weekly dose to take 1/2 tablets or 7.5 mg on Mondays and Thursdays and take 5 mg all other days.   INR 2.4 today. Goal 2.0-3.0, improved, no change for now  Recheck in 6-8 weeks     Follow up plan: Return if symptoms worsen or fail to improve, for 6 to 8-week INR.  Counseling provided for all of the vaccine components Orders Placed This Encounter  Procedures  ToxASSURE Select 13 (MW), Urine    [name], MD [facility] 10/04/2021, 2:47 PM

## 2021-10-26 DIAGNOSIS — E1142 Type 2 diabetes mellitus with diabetic polyneuropathy: Secondary | ICD-10-CM | POA: Diagnosis not present

## 2021-10-26 DIAGNOSIS — M79676 Pain in unspecified toe(s): Secondary | ICD-10-CM | POA: Diagnosis not present

## 2021-10-26 DIAGNOSIS — B351 Tinea unguium: Secondary | ICD-10-CM | POA: Diagnosis not present

## 2021-10-26 DIAGNOSIS — L84 Corns and callosities: Secondary | ICD-10-CM | POA: Diagnosis not present

## 2021-10-28 ENCOUNTER — Other Ambulatory Visit: Payer: Self-pay | Admitting: Cardiology

## 2021-11-13 DIAGNOSIS — M810 Age-related osteoporosis without current pathological fracture: Secondary | ICD-10-CM | POA: Diagnosis not present

## 2021-11-13 DIAGNOSIS — Z6829 Body mass index (BMI) 29.0-29.9, adult: Secondary | ICD-10-CM | POA: Diagnosis not present

## 2021-11-13 DIAGNOSIS — E663 Overweight: Secondary | ICD-10-CM | POA: Diagnosis not present

## 2021-11-13 DIAGNOSIS — R5383 Other fatigue: Secondary | ICD-10-CM | POA: Diagnosis not present

## 2021-11-13 DIAGNOSIS — M0579 Rheumatoid arthritis with rheumatoid factor of multiple sites without organ or systems involvement: Secondary | ICD-10-CM | POA: Diagnosis not present

## 2021-11-13 DIAGNOSIS — K5780 Diverticulitis of intestine, part unspecified, with perforation and abscess without bleeding: Secondary | ICD-10-CM | POA: Diagnosis not present

## 2021-11-16 DIAGNOSIS — M1712 Unilateral primary osteoarthritis, left knee: Secondary | ICD-10-CM | POA: Diagnosis not present

## 2021-11-28 ENCOUNTER — Ambulatory Visit (INDEPENDENT_AMBULATORY_CARE_PROVIDER_SITE_OTHER): Payer: Medicare Other | Admitting: *Deleted

## 2021-11-28 DIAGNOSIS — Z0000 Encounter for general adult medical examination without abnormal findings: Secondary | ICD-10-CM

## 2021-11-28 NOTE — Progress Notes (Signed)
MEDICARE ANNUAL WELLNESS VISIT  11/28/2021   Telephone Visit Disclaimer This Medicare AWV was conducted by telephone due to national recommendations for restrictions regarding the COVID-19 Pandemic (e.g. social distancing).  I verified, using two identifiers, that I am speaking with Melody Casey or their authorized healthcare agent. I discussed the limitations, risks, security, and privacy concerns of performing an evaluation and management service by telephone and the potential availability of an in-person appointment in the future. The patient expressed understanding and agreed to proceed.  Location of Patient: Home  Location of Provider (nurse):  [facility]  Subjective:    Melody Casey is a 75 y.o. female patient of Melody Casey, Melody Kaufmann, MD who had a Medicare Annual Wellness Visit today via telephone. Melody Casey is retired and lives with there husband Melody Casey. She has 2 children that live out of state. She reports that she is socially active and does interact with friends/family regularly. She is minimally physically active and enjoys reading  Patient Care Team: Melody Casey, Melody Kaufmann, MD as PCP - General (Family Medicine) Minus Breeding, MD as PCP - Cardiology (Cardiology) Gardenia Phlegm, NP as Nurse Practitioner (Hematology and Oncology)  Advanced Directives 11/28/2021 05/31/2020 08/06/2018 07/24/2018 05/12/2018 03/26/2017 08/07/2016  Does Patient Have a Medical Advance Directive? Yes Yes No No Yes Yes Yes  Type of Paramedic of [place];Living will - - - [place];Living will [place];Living will -  Does patient want to make changes to medical advance directive? - - - - - No - Patient declined -  Copy of [place] in Chart? No - copy requested - - - No - copy requested No - copy requested -  Would patient like information on creating a medical advance directive? - - No - Patient declined No - Patient declined  - - Yes - Educational materials given    Hospital Utilization Over the Past 12 Months: # of hospitalizations or ER visits: 0 # of surgeries: 0  Review of Systems    Patient reports that her overall health is worse compared to last year.  History obtained from the patient and patient chart.   Patient Reported Readings (BP, Pulse, CBG, Weight, etc) none  Pain Assessment Pain : No/denies pain     Current Medications & Allergies (verified) Allergies as of 11/28/2021       Reactions   Miralax [polyethylene Glycol] Swelling, Rash   Took [facility] brand developed rash. Patient states she tolerated name brand MiraLax in the past. 09/01/15. Patient states she had diffuse swelling including swelling of her lips.    Vancomycin Rash, Shortness Of Breath   Acetaminophen Hives   Oxycodone-acetaminophen Itching   Sulfa Antibiotics Rash   All over rash   Sulfacetamide Sodium Rash   All over rash   Sulfasalazine Rash   All over rash All over rash   Banana Other (See Comments)   Unknown   Latex Rash   Tape Rash        Medication List        Accurate as of November 28, 2021 11:39 AM. If you have any questions, ask your nurse or doctor.          ALPRAZolam 0.25 MG tablet Commonly known as: XANAX Take 1 tablet (0.25 mg total) by mouth 3 (three) times daily as needed for anxiety.   amLODipine 5 MG tablet Commonly known as: NORVASC TAKE 1 TABLET(5 MG) BY MOUTH DAILY  atorvastatin 40 MG tablet Commonly known as: LIPITOR TAKE 1 AND 1/2 TABLETS BY MOUTH EVERY DAY   cholecalciferol 25 MCG (1000 UNIT) tablet Commonly known as: VITAMIN D3 Take 1,000 Units by mouth daily.   dofetilide 250 MCG capsule Commonly known as: TIKOSYN TAKE ONE CAPSULE BY MOUTH TWICE A DAY   hydroxychloroquine 200 MG tablet Commonly known as: PLAQUENIL Take 1 tablet (200 mg total) by mouth 2 (two) times daily.   lisinopril 20 MG tablet Commonly known as: ZESTRIL Take 1 tablet (20 mg total) by  mouth daily.   magnesium oxide 400 MG tablet Commonly known as: MAG-OX Take 400 mg by mouth 2 (two) times daily.   metoprolol succinate 25 MG 24 hr tablet Commonly known as: TOPROL-XL TAKE 1 TABLET(25 MG) BY MOUTH DAILY   potassium chloride 10 MEQ tablet Commonly known as: KLOR-CON M TAKE 1 TABLET BY MOUTH DAILY   warfarin 5 MG tablet Commonly known as: COUMADIN Take as directed by the anticoagulation clinic. If you are unsure how to take this medication, talk to your nurse or doctor. Original instructions: TAKE 1 AND 1/2 TABLETS(7.5 MG) BY MOUTH DAILY        History (reviewed): Past Medical History:  Diagnosis Date   AAA (abdominal aortic aneurysm)    Acute diverticulitis    Anxiety    Arthritis    Atrial fibrillation ([place])    a. Dx 03/2011 - on tikosyn/coumadin.   CAD (coronary artery disease)    a. NSTEMI 02/2011: occ mid Cx, DES to OM2, residual nonobst LAD dz.   Cancer [facility])    breast   Diabetes mellitus    Diverticulitis    Diverticulitis of colon     2008, 04/2011, 12/2014, 08/2015   Foot deformity 1947   right; ??scleroderma   Hemangioma    liver   HLD (hyperlipidemia)    HTN (hypertension)    Osteoporosis    Tobacco abuse    stopped smoking 2012   Ureteral obstruction    History of gross hematuria/right hydronephrosis 2/2 to uteropelvic junction obstruction, s/p cystoscopy in January 2007 with bilateral retrograde pyelography, right ureter arthroscopy, right ureteral stent placement, bladder biopsies, stent removal since then.   Wears dentures    top   Past Surgical History:  Procedure Laterality Date   BIOPSY N/A 11/01/2015   Procedure: BIOPSY;  Surgeon: Danie Binder, MD;  Location: [facility];  Service: Endoscopy;  Laterality: N/A;   BREAST LUMPECTOMY WITH NEEDLE LOCALIZATION AND AXILLARY SENTINEL LYMPH NODE BX Right 03/01/2014   Procedure: BREAST LUMPECTOMY WITH NEEDLE LOCALIZATION AND AXILLARY SENTINEL LYMPH NODE BX;  Surgeon: Edward Jolly, MD;   Location: [place];  Service: General;  Laterality: Right;   [place]  2012   stent   COLON RESECTION N/A 07/25/2018   Procedure: COLOSTOMY;  Surgeon: Rolm Bookbinder, MD;  Location: [place];  Service: General;  Laterality: N/A;   COLONOSCOPY WITH PROPOFOL N/A 06/29/2014   Dr. Wynetta Emery: universal diverticulosis   CORONARY STENT PLACEMENT  2012   ESOPHAGOGASTRODUODENOSCOPY (EGD) WITH PROPOFOL N/A 11/01/2015   Procedure: ESOPHAGOGASTRODUODENOSCOPY (EGD) WITH PROPOFOL;  Surgeon: Danie Binder, MD;  Location: [facility];  Service: Endoscopy;  Laterality: N/A;   KNEE ARTHROSCOPY     left   LAPAROTOMY N/A 07/25/2018   Procedure: EXPLORATORY LAPAROTOMY;  Surgeon: Rolm Bookbinder, MD;  Location: [place];  Service: General;  Laterality: N/A;   PARTIAL COLECTOMY N/A 07/25/2018  Procedure: COLECTOMY;  Surgeon: Rolm Bookbinder, MD;  Location: [place];  Service: General;  Laterality: N/A;   Right leg surgery  age 88   As a child for  ?scleroderma per patient   TONSILLECTOMY AND ADENOIDECTOMY     TUBAL LIGATION     Ureteral surgery  2011   rt ureterostomy-stent   Family History  Problem Relation Age of Onset   Lung cancer Father 12       deceased   Cancer Father    Heart failure Mother 46       deceased   Heart disease Mother    Diabetes Brother    Hypertension Brother    Cancer Brother        prostate   Colon cancer Neg Hx    Liver disease Neg Hx    Social History   Socioeconomic History   Marital status: Married    Spouse name: Melody Casey   Number of children: 2   Years of education: Not on file   Highest education level: 12th grade  Occupational History   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       2 children that live in [place]. 2 dogs + farm animals.       Enjoys reading.    Social Determinants of Health   Financial Resource Strain: Not on file  Food Insecurity: Not on file  Transportation Needs: Not on file  Physical Activity: Not on file  Stress: Not on file  Social Connections: Not on file    Activities of Daily Living In your present state of health, do you have any difficulty performing the following activities: 11/28/2021  Hearing? Y  Comment bought over counter hearing aids  Vision? N  Difficulty concentrating or making decisions? N  Walking or climbing stairs? Y  Comment has to take stairs one at a time, needs knee replacement  Dressing or bathing? N  Doing errands, shopping? N  Preparing Food and eating ? N  Using the Toilet? N  Comment has colostomy bag  In the past six months, have you accidently leaked urine? Y  Comment wears depends  Do you have problems with loss of bowel control? N  Comment has colostomy bag  Managing your Medications? N  Managing your Finances? N  Housekeeping or managing your Housekeeping? N  Some recent data might be hidden    Patient Education/ Literacy How often do you need to have someone help you when you read instructions, pamphlets, or other written materials from your doctor or pharmacy?: 1 - Never What is the last grade level you completed in school?: 12th  Exercise Current Exercise Habits: The patient does not participate in regular exercise at present (knee pain), Exercise limited by: orthopedic condition(s) (needs knee replacement)  Diet Patient reports consuming 3 meals a day and 0 snack(s) a day Patient reports that her primary  diet is: Regular Patient reports  that she does have regular access to food.   Depression Screen PHQ 2/9 Scores 11/28/2021 10/04/2021 08/25/2021 07/24/2021 06/16/2021 04/27/2021 03/01/2021  PHQ - 2 Score 0 2 0 0 1 1 0  PHQ- 9 Score - 9 0 - - - -     Fall Risk Fall Risk  11/28/2021 10/04/2021 08/25/2021 07/24/2021 06/16/2021  Falls in the past year? 0 0 0 0 0  Comment - - - - -  Number falls in past yr: - - - - -  Injury with Fall? - - - - -  Risk for fall due to : - - - - -  Follow up - - - - -     Objective:  Melody Casey seemed alert and oriented and she participated appropriately during our telephone visit.  Blood Pressure Weight BMI  BP Readings from Last 3 Encounters:  10/04/21 (!) 158/80  08/25/21 138/68  07/27/21 (!) 177/60   Wt Readings from Last 3 Encounters:  10/04/21 185 lb (83.9 kg)  08/25/21 185 lb 6 oz (84.1 kg)  07/27/21 183 lb 3.2 oz (83.1 kg)   BMI Readings from Last 1 Encounters:  10/04/21 28.13 kg/m    *Unable to obtain current vital signs, weight, and BMI due to telephone visit type  Hearing/Vision  Shelvie did not seem to have difficulty with hearing/understanding during the telephone conversation Reports that she has not had a formal eye exam by an eye care professional within the past year Reports that she has not had a formal hearing evaluation within the past year *Unable to fully assess hearing and vision during telephone visit type  Cognitive Function: 6CIT Screen 11/28/2021  What Year? 0 points  What month? 0 points  What time? 0 points  Count back from 20 0 points  Months in reverse 0 points  Repeat phrase 0 points  Total Score 0   (Normal:0-7, Significant for Dysfunction: >8)  Normal Cognitive Function Screening: Yes   Immunization & Health Maintenance Record Immunization History  Administered Date(s) Administered   Fluad Quad(high Dose 65+) 10/17/2020, 08/25/2021   Influenza, High Dose Seasonal PF 09/08/2019   Influenza-Unspecified 09/09/2018   Moderna Sars-Covid-2  Vaccination 02/19/2020, 03/23/2020, 11/04/2020   Pneumococcal Conjugate-13 10/17/2020   Pneumococcal Polysaccharide-23 01/24/2010   Tdap 05/02/2012   Zoster Recombinat (Shingrix) 11/24/2020, 03/01/2021    Health Maintenance  Topic Date Due   COVID-19 Vaccine (4 - Booster for Moderna series) 07/23/2022 (Originally 12/30/2020)   OPHTHALMOLOGY EXAM  10/04/2022 (Originally 07/25/2021)   HEMOGLOBIN A1C  02/22/2022   TETANUS/TDAP  05/02/2022   DEXA SCAN  07/25/2023   Pneumonia Vaccine 1+ Years old  Completed   INFLUENZA VACCINE  Completed   Hepatitis C Screening  Completed   Zoster Vaccines- Shingrix  Completed   HPV VACCINES  Aged Out   FOOT EXAM  Discontinued   COLONOSCOPY (Pts 45-34yrs Insurance coverage will need to be confirmed)  Discontinued       Assessment  This is a routine wellness examination for Melody Casey.  Health Maintenance: Due or Overdue There are no preventive care reminders to display for this patient.  Melody Casey does not need a referral for Community Assistance: Care Management:   no Social Work:    no Prescription Assistance:  no Nutrition/Diabetes Education:  no   Plan:  Personalized Goals  Goals Addressed             This Visit's Progress  Patient Stated       11/28/2021 AWV Goal: Keep All Scheduled Appointments  Over the next year, patient will attend all scheduled appointments with their PCP and any specialists that they see.        Personalized Health Maintenance & Screening Recommendations  Eye exam, Hearing exam  Lung Cancer Screening Recommended: no (Low Dose CT Chest recommended if Age 61-80 years, 30 pack-year currently smoking OR have quit w/in past 15 years) Hepatitis C Screening recommended: no HIV Screening recommended: no  Advanced Directives: Written information was not prepared per patient's request.  Referrals & Orders No orders of the defined types were placed in this encounter.   Follow-up Plan Follow-up  with Melody Casey, Melody Kaufmann, MD as planned   I have personally reviewed and noted the following in the patients chart:   Medical and social history Use of alcohol, tobacco or illicit drugs  Current medications and supplements Functional ability and status Nutritional status Physical activity Advanced directives List of other physicians Hospitalizations, surgeries, and ER visits in previous 12 months Vitals Screenings to include cognitive, depression, and falls Referrals and appointments  In addition, I have reviewed and discussed with Melody Casey certain preventive protocols, quality metrics, and best practice recommendations. A written personalized care plan for preventive services as well as general preventive health recommendations is available and can be mailed to the patient at her request.      Baldomero Lamy, LPN  [phone]

## 2021-11-29 ENCOUNTER — Other Ambulatory Visit: Payer: Self-pay

## 2021-11-29 ENCOUNTER — Ambulatory Visit (INDEPENDENT_AMBULATORY_CARE_PROVIDER_SITE_OTHER): Payer: Medicare Other | Admitting: Family Medicine

## 2021-11-29 ENCOUNTER — Encounter: Payer: Self-pay | Admitting: Family Medicine

## 2021-11-29 VITALS — BP 121/69 | HR 62 | Ht 68.0 in | Wt 184.0 lb

## 2021-11-29 DIAGNOSIS — I4819 Other persistent atrial fibrillation: Secondary | ICD-10-CM | POA: Diagnosis not present

## 2021-11-29 DIAGNOSIS — Z79899 Other long term (current) drug therapy: Secondary | ICD-10-CM | POA: Diagnosis not present

## 2021-11-29 DIAGNOSIS — E118 Type 2 diabetes mellitus with unspecified complications: Secondary | ICD-10-CM

## 2021-11-29 DIAGNOSIS — R42 Dizziness and giddiness: Secondary | ICD-10-CM | POA: Diagnosis not present

## 2021-11-29 LAB — COAGUCHEK XS/INR WAIVED
INR: 2.1 — ABNORMAL HIGH (ref 0.9–1.1)
Prothrombin Time: 24.7 s

## 2021-11-29 MED ORDER — MECLIZINE HCL 25 MG PO TABS: 25.0000 mg | ORAL_TABLET | Freq: Three times a day (TID) | ORAL | 0 refills | Status: DC | PRN

## 2021-11-29 NOTE — Addendum Note (Signed)
Addended by: Alphonzo Dublin on: 11/29/2021 12:15 PM   Modules accepted: Orders

## 2021-11-29 NOTE — Progress Notes (Signed)
BP 121/69    Pulse 62    Ht 5\' 8"  (1.727 m)    Wt 184 lb (83.5 kg)    SpO2 97%    BMI 27.98 kg/m    Subjective:   Patient ID: Melody Casey, female    DOB: [birth year], 75 y.o.   MRN: [id number]  HPI: Melody Casey is a 75 y.o. female presenting on 11/29/2021 for Medical Management of Chronic Issues, Atrial Fibrillation, and Dizziness   HPI Coumadin recheck Target goal: 2.1 Reason on anticoagulation: A. fib Patient denies any bruising or bleeding or chest pain or palpitations   Patient is complaining of some dizziness and spinning sensation and vertigo that she has been getting over the past few days.  She since she had her husband bring her here to drive her here today because she feels like she is unstable because of the vertigo.  She says has not gotten worse but has not really improved over the past couple days.  Relevant past medical, surgical, family and social history reviewed and updated as indicated. Interim medical history since our last visit reviewed. Allergies and medications reviewed and updated.  Review of Systems  Constitutional:  Negative for chills and fever.  Eyes:  Negative for visual disturbance.  Respiratory:  Negative for chest tightness and shortness of breath.   Cardiovascular:  Negative for chest pain and leg swelling.  Gastrointestinal:  Negative for blood in stool.  Genitourinary:  Negative for difficulty urinating, dysuria and hematuria.  Musculoskeletal:  Negative for back pain and gait problem.  Skin:  Negative for rash.  Neurological:  Positive for dizziness. Negative for light-headedness, numbness and headaches.  Psychiatric/Behavioral:  Negative for agitation and behavioral problems.   All other systems reviewed and are negative.  Per HPI unless specifically indicated above   Allergies as of 11/29/2021       Reactions   Miralax [polyethylene Glycol] Swelling, Rash   Took [facility] brand developed rash. Patient states she tolerated name brand  MiraLax in the past. 09/01/15. Patient states she had diffuse swelling including swelling of her lips.    Vancomycin Rash, Shortness Of Breath   Acetaminophen Hives   Oxycodone-acetaminophen Itching   Sulfa Antibiotics Rash   All over rash   Sulfacetamide Sodium Rash   All over rash   Sulfasalazine Rash   All over rash All over rash   Banana Other (See Comments)   Unknown   Latex Rash   Tape Rash        Medication List        Accurate as of November 29, 2021 12:13 PM. If you have any questions, ask your nurse or doctor.          ALPRAZolam 0.25 MG tablet Commonly known as: XANAX Take 1 tablet (0.25 mg total) by mouth 3 (three) times daily as needed for anxiety.   amLODipine 5 MG tablet Commonly known as: NORVASC TAKE 1 TABLET(5 MG) BY MOUTH DAILY   atorvastatin 40 MG tablet Commonly known as: LIPITOR TAKE 1 AND 1/2 TABLETS BY MOUTH EVERY DAY   cholecalciferol 25 MCG (1000 UNIT) tablet Commonly known as: VITAMIN D3 Take 1,000 Units by mouth daily.   dofetilide 250 MCG capsule Commonly known as: TIKOSYN TAKE ONE CAPSULE BY MOUTH TWICE A DAY   hydroxychloroquine 200 MG tablet Commonly known as: PLAQUENIL Take 1 tablet (200 mg total) by mouth 2 (two) times daily.   lisinopril 20 MG tablet Commonly known as: ZESTRIL Take 1  tablet (20 mg total) by mouth daily.   magnesium oxide 400 MG tablet Commonly known as: MAG-OX Take 400 mg by mouth 2 (two) times daily.   meclizine 25 MG tablet Commonly known as: ANTIVERT Take 1 tablet (25 mg total) by mouth 3 (three) times daily as needed for dizziness. Started by: [name] Lashawn Orrego, MD   metoprolol succinate 25 MG 24 hr tablet Commonly known as: TOPROL-XL TAKE 1 TABLET(25 MG) BY MOUTH DAILY   potassium chloride 10 MEQ tablet Commonly known as: KLOR-CON M TAKE 1 TABLET BY MOUTH DAILY   warfarin 5 MG tablet Commonly known as: COUMADIN Take as directed by the anticoagulation clinic. If you are unsure how to  take this medication, talk to your nurse or doctor. Original instructions: TAKE 1 AND 1/2 TABLETS(7.5 MG) BY MOUTH DAILY         Objective:   BP 121/69    Pulse 62    Ht 5\' 8"  (1.727 m)    Wt 184 lb (83.5 kg)    SpO2 97%    BMI 27.98 kg/m   Wt Readings from Last 3 Encounters:  11/29/21 184 lb (83.5 kg)  10/04/21 185 lb (83.9 kg)  08/25/21 185 lb 6 oz (84.1 kg)    Physical Exam Vitals and nursing note reviewed.  Constitutional:      Appearance: Normal appearance.  Skin:    Findings: No bruising.  Neurological:     General: No focal deficit present.     Mental Status: She is alert and oriented to person, place, and time.     Motor: No weakness.    Results for orders placed or performed in visit on 10/04/21  CoaguChek XS/INR Waived  Result Value Ref Range   INR 2.4 (H) 0.9 - 1.1   Prothrombin Time 29.0 sec    Assessment & Plan:   Problem List Items Addressed This Visit       Cardiovascular and Mediastinum   Persistent atrial fibrillation ([place]) - Primary   Relevant Orders   CoaguChek XS/INR Waived     Endocrine   Type 2 diabetes mellitus with complication, without long-term current use of insulin (HCC)   Relevant Orders   Bayer DCA Hb A1c Waived     Other   High risk medication use   Other Visit Diagnoses     Vertigo       Relevant Medications   meclizine (ANTIVERT) 25 MG tablet       Description   continue weekly dose to take 1/2 tablets or 7.5 mg on Mondays and Thursdays and take 5 mg all other days.   INR 2.1 today. Goal 2.0-3.0, improved, no change for now  Recheck in 6-8 weeks   Vertigo, given meclizine and a packet with maneuvers to help  Follow up plan: Return if symptoms worsen or fail to improve, for 6 to 8-week INR recheck.  Counseling provided for all of the vaccine components Orders Placed This Encounter  Procedures   CoaguChek XS/INR Waived   Bayer [facility] Hb A1c Ulyses Southward, MD [facility] 11/29/2021, 12:13 PM

## 2021-12-07 LAB — TOXASSURE SELECT 13 (MW), URINE

## 2021-12-12 ENCOUNTER — Other Ambulatory Visit: Payer: Self-pay | Admitting: Orthopedic Surgery

## 2021-12-14 ENCOUNTER — Telehealth: Payer: Self-pay | Admitting: Cardiology

## 2021-12-14 NOTE — Telephone Encounter (Addendum)
° °  Name: Melody Casey  DOB: 15-Jun-1946  MRN: [id number]  Primary Cardiologist: Minus Breeding, MD  Chart reviewed as part of pre-operative protocol coverage. Because of [name]'s past medical history and time since last visit, she will require a follow-up visit in order to better assess preoperative cardiovascular risk.  Given ongoing Tikosyn rx, needs OV with EKG to ensure clinically stable since last OV was > 6 months ago. (Guidelines suggest monitoring Cr/QTc q3 months or as medically indicated.)  Pre-op covering staff: - Please schedule appointment and call patient to inform them. If patient already had an upcoming appointment within acceptable timeframe, please add "pre-op clearance" to the appointment notes so provider is aware. - Please contact requesting surgeon's office via preferred method (i.e, phone, fax) to inform them of need for appointment prior to surgery.  Do not see that we follow her warfarin but will route to pharm for input on whether bridging needed so that this is available at time of OV with provider.  Charlie Pitter, PA-C  12/14/2021, 4:29 PM

## 2021-12-14 NOTE — Telephone Encounter (Signed)
Info is now available for provider at time of OV. Await scheduling of appt by callback.

## 2021-12-14 NOTE — Telephone Encounter (Signed)
Patient with diagnosis of A fib on warfarin for anticoagulation.    Procedure: Left Knee Replacement  Date of procedure: 01/01/22   CHA2DS2-VASc Score = 8  This indicates a 10.8% annual risk of stroke. The patient's score is based upon: CHF History: 0 HTN History: 1 Diabetes History: 1 Stroke History: 2 Vascular Disease History: 1 Age Score: 2 Gender Score: 1  Patient will be high risk off anticoagulation for extended period of time.  However her INR/warfarin is managed by Dr Building control surveyor at [facility].  Warfarin questions should be sent to Dr Warrick Parisian

## 2021-12-14 NOTE — Telephone Encounter (Signed)
° °  [facility] Pre-operative Risk Assessment    Request for surgical clearance:  What type of surgery is being performed?  Left Knee Replacement   When is this surgery scheduled?  01/01/22   What type of clearance is required (medical clearance vs. Pharmacy clearance to hold med vs. Both)?  Both   Are there any medications that need to be held prior to surgery and how long? Their office is requesting our recommendation regarding medication   Practice name and name of physician performing surgery?  [facility]   Dr. Mayer Camel   What is your office phone number? [phone]    7.   What is your office fax number? [phone]  8.   Anesthesia type (None, local, MAC, general) ?  Spinal    Melody Casey 12/14/2021, 2:25 PM

## 2021-12-15 NOTE — Telephone Encounter (Signed)
I s/w the pt and informed that she is going to need an appt for pre op clearance. Pt is agreeable to be seen in the [facility] location with Laurann Montana, NP 12/21/21 @ 1:55. Pt has been given the location address and [place] #. Pt is grateful for the pre op appt. I will forward notes to NP for pre op appt. Will send FYI to requesting office pt has appt 12/21/21.

## 2021-12-21 ENCOUNTER — Telehealth: Payer: Self-pay | Admitting: Family Medicine

## 2021-12-21 ENCOUNTER — Ambulatory Visit (INDEPENDENT_AMBULATORY_CARE_PROVIDER_SITE_OTHER): Payer: Medicare Other | Admitting: Family

## 2021-12-21 ENCOUNTER — Other Ambulatory Visit: Payer: Self-pay

## 2021-12-21 ENCOUNTER — Encounter (HOSPITAL_BASED_OUTPATIENT_CLINIC_OR_DEPARTMENT_OTHER): Payer: Self-pay | Admitting: Family

## 2021-12-21 VITALS — BP 122/64 | HR 63 | Ht 67.0 in | Wt 189.0 lb

## 2021-12-21 DIAGNOSIS — E785 Hyperlipidemia, unspecified: Secondary | ICD-10-CM

## 2021-12-21 DIAGNOSIS — D6859 Other primary thrombophilia: Secondary | ICD-10-CM

## 2021-12-21 DIAGNOSIS — I10 Essential (primary) hypertension: Secondary | ICD-10-CM

## 2021-12-21 DIAGNOSIS — I480 Paroxysmal atrial fibrillation: Secondary | ICD-10-CM | POA: Diagnosis not present

## 2021-12-21 DIAGNOSIS — Z01810 Encounter for preprocedural cardiovascular examination: Secondary | ICD-10-CM | POA: Diagnosis not present

## 2021-12-21 MED ORDER — DOFETILIDE 250 MCG PO CAPS: 250.0000 ug | ORAL_CAPSULE | Freq: Two times a day (BID) | ORAL | 3 refills | Status: DC

## 2021-12-21 NOTE — Telephone Encounter (Signed)
Pt called to let Dr Dettinger know that her surgery is scheduled on 01/01/22 and needs to know when she should stop taking her Warfarin Rx before surgery?   Pt also wants to know if Dr Dettinger can send in refills for her Xanax Rx to [facility] in [place] because he didn't do that at her last visit.

## 2021-12-21 NOTE — Patient Instructions (Addendum)
Medication Instructions:  Continue your current medications.   You will need to hold your Warfarin (Coumadin) prior to your surgery at the direction of Dr. Warrick Parisian.   Future refills of Xanax will come from Dr. Warrick Parisian.   You may continue your Tikosyn and Plaquenil. Your QT interval on your EKG was normal. We will continue your medications and monitor with an EKG at least once per year.   *If you need a refill on your cardiac medications before your next appointment, please call your pharmacy*   Lab Work: None ordered today.    Testing/Procedures: Your EKG today showed normal sinus rhythm with a normal QT interval.   Follow-Up: At [facility], you and your health needs are our priority.  As part of our continuing mission to provide you with exceptional heart care, we have created designated Provider Care Teams.  These Care Teams include your primary Cardiologist (physician) and Advanced Practice Providers (APPs -  Physician Assistants and Nurse Practitioners) who all work together to provide you with the care you need, when you need it.  We recommend signing up for the patient portal called "MyChart".  Sign up information is provided on this After Visit Summary.  MyChart is used to connect with patients for Virtual Visits (Telemedicine).  Patients are able to view lab/test results, encounter notes, upcoming appointments, etc.  Non-urgent messages can be sent to your provider as well.   To learn more about what you can do with MyChart, go to [email or web address].    Your next appointment:   1 year(s)  The format for your next appointment:   In Person  Provider:   Minus Breeding, MD     Other Instructions  Loel Dubonnet, NP will send a note to Dr. Mayer Camel that you are cleared for your surgery.   Heart Healthy Diet Recommendations: A low-salt diet is recommended. Meats should be grilled, baked, or boiled. Avoid fried foods. Focus on lean protein sources like fish or  chicken with vegetables and fruits. The American Heart Association is a Microbiologist!  American Heart Association Diet and Lifeystyle Recommendations    Exercise recommendations: The American Heart Association recommends 150 minutes of moderate intensity exercise weekly. Try 30 minutes of moderate intensity exercise 4-5 times per week. This could include walking, jogging, or swimming.

## 2021-12-21 NOTE — Progress Notes (Signed)
Office Visit    Patient Name: Melody Casey Date of Encounter: 12/21/2021  PCP:  Dettinger, [name], MD   [place]  Cardiologist:  Minus Breeding, MD  Advanced Practice Provider:  No care team member to display Electrophysiologist:  None      Chief Complaint    Melody Casey is a 76 y.o. female with a hx of CAD, PAF presents today for preoperative cardiovascular clearance  Past Medical History    Past Medical History:  Diagnosis Date   AAA (abdominal aortic aneurysm)    Acute diverticulitis    Anxiety    Arthritis    Atrial fibrillation ([place])    a. Dx 03/2011 - on tikosyn/coumadin.   CAD (coronary artery disease)    a. NSTEMI 02/2011: occ mid Cx, DES to OM2, residual nonobst LAD dz.   Cancer [facility])    breast   Diabetes mellitus    Diverticulitis    Diverticulitis of colon     2008, 04/2011, 12/2014, 08/2015   Foot deformity 1947   right; ??scleroderma   Hemangioma    liver   HLD (hyperlipidemia)    HTN (hypertension)    Osteoporosis    Tobacco abuse    stopped smoking 2012   Ureteral obstruction    History of gross hematuria/right hydronephrosis 2/2 to uteropelvic junction obstruction, s/p cystoscopy in January 2007 with bilateral retrograde pyelography, right ureter arthroscopy, right ureteral stent placement, bladder biopsies, stent removal since then.   Wears dentures    top   Past Surgical History:  Procedure Laterality Date   BIOPSY N/A 11/01/2015   Procedure: BIOPSY;  Surgeon: Danie Binder, MD;  Location: [facility];  Service: Endoscopy;  Laterality: N/A;   BREAST LUMPECTOMY WITH NEEDLE LOCALIZATION AND AXILLARY SENTINEL LYMPH NODE BX Right 03/01/2014   Procedure: BREAST LUMPECTOMY WITH NEEDLE LOCALIZATION AND AXILLARY SENTINEL LYMPH NODE BX;  Surgeon: Edward Jolly, MD;  Location: [place];  Service: General;  Laterality: Right;   [place]  2012   stent   COLON RESECTION N/A  07/25/2018   Procedure: COLOSTOMY;  Surgeon: Rolm Bookbinder, MD;  Location: [place];  Service: General;  Laterality: N/A;   COLONOSCOPY WITH PROPOFOL N/A 06/29/2014   Dr. Wynetta Emery: universal diverticulosis   CORONARY STENT PLACEMENT  2012   ESOPHAGOGASTRODUODENOSCOPY (EGD) WITH PROPOFOL N/A 11/01/2015   Procedure: ESOPHAGOGASTRODUODENOSCOPY (EGD) WITH PROPOFOL;  Surgeon: Danie Binder, MD;  Location: [facility];  Service: Endoscopy;  Laterality: N/A;   KNEE ARTHROSCOPY     left   LAPAROTOMY N/A 07/25/2018   Procedure: EXPLORATORY LAPAROTOMY;  Surgeon: Rolm Bookbinder, MD;  Location: [place];  Service: General;  Laterality: N/A;   PARTIAL COLECTOMY N/A 07/25/2018   Procedure: COLECTOMY;  Surgeon: Rolm Bookbinder, MD;  Location: [place];  Service: General;  Laterality: N/A;   Right leg surgery  age 36   As a child for  ?scleroderma per patient   TONSILLECTOMY AND ADENOIDECTOMY     TUBAL LIGATION     Ureteral surgery  2011   rt ureterostomy-stent    Allergies  Allergies  Allergen Reactions   Melody Casey [Polyethylene Glycol] Swelling and Rash    Took [employer] brand developed rash. Patient states she tolerated name brand Melody Casey in the past.  09/01/15. Patient states she had diffuse swelling including swelling of her lips.    Vancomycin Rash and Shortness Of Breath   Acetaminophen Hives  Banana Other (See Comments)    Upset stomach   Oxycodone-Acetaminophen Itching   Sulfa Antibiotics Rash    All over rash   Sulfacetamide Sodium Rash    All over rash   Sulfasalazine Rash    All over rash    Latex Rash   Tape Rash    History of Present Illness    Melody Casey is a 76 y.o. female with a hx of CAD, PAF, HTN, HLDlast seen 02/15/21.  Coronary artery disease dates back to NSTEMI 02/2011 with occluded mid Cx, DES to OM2, and residual nonobstructive LAD disease. Her atrial fibrillation has been managed with Tikosyn and Melody Casey. She was last seen 02/2021 doing well and recommended for 1 year  follow up.   She presents today for preop clearance for left total knee with Dr. Mayer Camel which is scheduled for 01/01/2022. Her primary care provider manages her anticoagulation with Melody Casey due to atrial fibrillation and CHADS2VASc of 8.  She reports she has been doing overall well from a cardiac perspective. Reports no shortness of breath nor dyspnea on exertion. Reports no chest pain, pressure, or tightness. No edema, orthopnea, PND.  She reports rare palpitations for which she on occasion will take 1/2 tablet of her Melody Casey.  She has been told previously by her primary care provider that she could also take an additional half tablet of her Melody Casey.  She has had to do this only once with good relief of symptoms..  Her exercise tolerance is somewhat limited due to knee pain but she is still able to walk her property, prepare her own meals, and do her own housework independently.  EKGs/Labs/Other Studies Reviewed:   The following studies were reviewed today:  EKG:  EKG is ordered today.  The ekg ordered today demonstrates NSR 65 bpm with occasional PVC. Nonspecific ST/T wave changes.  Recent Labs: 03/01/2021: Magnesium 2.2 08/25/2021: ALT 11; BUN 18; Creatinine, Ser 0.74; Hemoglobin 11.8; Platelets 185; Potassium 4.6; Sodium 138  Recent Lipid Panel    Component Value Date/Time   CHOL 150 08/25/2021 1323   TRIG 189 (H) 08/25/2021 1323   HDL 45 08/25/2021 1323   CHOLHDL 3.3 08/25/2021 1323   CHOLHDL 6.2 02/14/2011 0330   VLDL 26 02/14/2011 0330   LDLCALC 73 08/25/2021 1323   LDLDIRECT 158.6 04/05/2008 0833    Risk Assessment/Calculations:   CHA2DS2-VASc Score = 8   This indicates a 10.8% annual risk of stroke. The patient's score is based upon: CHF History: 0 HTN History: 1 Diabetes History: 1 Stroke History: 2 Vascular Disease History: 1 Age Score: 2 Gender Score: 1   Home Medications   Current Meds  Medication Sig   ALPRAZolam (Melody Casey) 0.25 MG tablet Take 1 tablet (0.25 mg  total) by mouth 3 (three) times daily as needed for anxiety.   amLODipine (NORVASC) 5 MG tablet TAKE 1 TABLET(5 MG) BY MOUTH DAILY   atorvastatin (LIPITOR) 40 MG tablet TAKE 1 AND 1/2 TABLETS BY MOUTH EVERY DAY (Patient taking differently: Take 60 mg by mouth every evening. TAKE 1 AND 1/2 TABLETS BY MOUTH EVERY DAY)   cholecalciferol (VITAMIN D3) 25 MCG (1000 UNIT) tablet Take 1,000 Units by mouth daily.   dofetilide (TIKOSYN) 250 MCG capsule TAKE ONE CAPSULE BY MOUTH TWICE A DAY   hydroxychloroquine (PLAQUENIL) 200 MG tablet Take 1 tablet (200 mg total) by mouth 2 (two) times daily.   ibuprofen (ADVIL) 200 MG tablet Take 600 mg by mouth in the morning and at bedtime.  lisinopril (ZESTRIL) 20 MG tablet Take 1 tablet (20 mg total) by mouth daily.   magnesium oxide (MAG-OX) 400 MG tablet Take 400 mg by mouth 2 (two) times daily.   meclizine (ANTIVERT) 25 MG tablet Take 1 tablet (25 mg total) by mouth 3 (three) times daily as needed for dizziness.   Melody Casey succinate (TOPROL-XL) 25 MG 24 hr tablet TAKE 1 TABLET(25 MG) BY MOUTH DAILY   potassium chloride (KLOR-CON) 10 MEQ tablet TAKE 1 TABLET BY MOUTH DAILY   Melody Casey (COUMADIN) 5 MG tablet TAKE 1 AND 1/2 TABLETS(7.5 MG) BY MOUTH DAILY (Patient taking differently: Take 5-7.5 mg by mouth See admin instructions. Take 1.5 tablets (7.5 mg) by mouth on Mondays & Thursdays.  Take 1 tablet (5 mg) by mouth on Sundays, Tuesdays, Wednesdays, Fridays & Saturdays.)     Review of Systems      All other systems reviewed and are otherwise negative except as noted above.  Physical Exam    VS:  BP 122/64    Pulse 63    Ht 5\' 7"  (1.702 m)    Wt 189 lb (85.7 kg)    SpO2 97%    BMI 29.60 kg/m  , BMI Body mass index is 29.6 kg/m.  Wt Readings from Last 3 Encounters:  12/21/21 189 lb (85.7 kg)  11/29/21 184 lb (83.5 kg)  10/04/21 185 lb (83.9 kg)     GEN: Well nourished, well developed, in no acute distress. HEENT: normal. Neck: Supple, no JVD, carotid  bruits, or masses. Cardiac: RRR, no murmurs, rubs, or gallops. No clubbing, cyanosis, edema.  Radials/PT 2+ and equal bilaterally.  Respiratory:  Respirations regular and unlabored, clear to auscultation bilaterally. GI: Soft, nontender, nondistended. MS: No deformity or atrophy. Skin: Warm and dry, no rash. Neuro:  Strength and sensation are intact. Psych: Normal affect.  Assessment & Plan    Preop clearance -upcoming knee surgery with Dr. Mayer Camel. According to the Revised Cardiac Risk Index (RCRI), her Perioperative Risk of Major Cardiac Event is (%): 6.6. Her  Functional Capacity in METs is: 4.3 according to the Duke Activity Status Index (DASI).  She is deemed acceptable risk for the planned procedure without additional cardiovascular testing.  Will route office note to surgical team so they are aware.  In regard to her Melody Casey is managed by her primary care office Coumadin clinic and they will address holding Coumadin prior to surgery.  PAF / Chronic anticoagulation - 11/13/21 Hb 12.1, normal electrolytes. Maintaining NSR by EKG today. Occasional PVC on EKG. Continue Toprol 25mg  QD, Tikosyn 29mcg BID.  Brings documentation from her pharmacist about interaction between Tikosyn and Plaquenil.  She has been on both medications for multiple years without QTC prolongation and we will continue monitoring with annual EKG. Denies bleeding applications on Melody Casey.  Continue to follow with primary care clinic regarding Melody Casey levels. CHA2DS2-VASc Score = 8 [CHF History: 0, HTN History: 1, Diabetes History: 1, Stroke History: 2, Vascular Disease History: 1, Age Score: 2, Gender Score: 1].  Therefore, the patient's annual risk of stroke is 10.8 %.      CAD - Stable with no anginal symptoms. No indication for ischemic evaluation.    HTN - BP well controlled. Continue current antihypertensive regimen.    HLD - 08/2021 LDL 73. 11/13/21 ALT 10, AST 15. Continue Atorvastatin.   Disposition: Follow up in 1  year(s) with Minus Breeding, MD or APP.  Signed, Loel Dubonnet, NP 12/21/2021, 2:07 PM [place]

## 2021-12-21 NOTE — Telephone Encounter (Signed)
Xanax can only be filled during a visit, since she does not take it every day there is no way for me to know when she is due for refills or needs refills, if she is getting close she will just have to let me know when she is there during the visit.  We do see her frequently because of the INR so we will discuss that at the next visit.   I would have her stop the warfarin 5 days before and start it back up the day after the surgery.

## 2021-12-21 NOTE — Telephone Encounter (Signed)
Pt has been informed to hold Coumadin 5d prior to surgery and to start back one day after surgery.  Informed pt that Xanax cannot be refilled outside of the OV. She states that she does have enough until her next INR check. Advised pt to make sure she mentions the Xanax medication when she comes in.

## 2021-12-22 ENCOUNTER — Other Ambulatory Visit (HOSPITAL_BASED_OUTPATIENT_CLINIC_OR_DEPARTMENT_OTHER): Payer: Self-pay

## 2021-12-22 ENCOUNTER — Telehealth: Payer: Self-pay | Admitting: Family

## 2021-12-22 DIAGNOSIS — I480 Paroxysmal atrial fibrillation: Secondary | ICD-10-CM

## 2021-12-22 MED ORDER — DOFETILIDE 250 MCG PO CAPS: 250.0000 ug | ORAL_CAPSULE | Freq: Two times a day (BID) | ORAL | 3 refills | Status: AC

## 2021-12-22 NOTE — Telephone Encounter (Signed)
°*  STAT* If patient is at the pharmacy, call can be transferred to refill team.   1. Which medications need to be refilled? (please list name of each medication and dose if known) dofetilide (TIKOSYN) 250 MCG capsule  2. Which pharmacy/location (including street and city if local pharmacy) is medication to be sent to? [facility] [id number] - [place], [place]  3. Do they need a 30 day or 90 day supply? 90 day  She needs script resent to [employer].  It was sent the wrong pharmacy originally.

## 2021-12-22 NOTE — Patient Instructions (Signed)
DUE TO COVID-19 ONLY ONE VISITOR IS ALLOWED TO COME WITH YOU AND STAY IN THE WAITING ROOM ONLY DURING PRE OP AND PROCEDURE.   **NO VISITORS ARE ALLOWED IN THE SHORT STAY AREA OR RECOVERY ROOM!!**  IF YOU WILL BE ADMITTED INTO THE HOSPITAL YOU ARE ALLOWED ONLY TWO SUPPORT PEOPLE DURING VISITATION HOURS ONLY (7 AM -8PM)   The support person(s) must pass our screening, gel in and out, and wear a mask at all times, including in the patients room. Patients must also wear a mask when staff or their support person are in the room. Visitors GUEST BADGE MUST BE WORN VISIBLY  One adult visitor may remain with you overnight and MUST be in the room by 8 P.M.  No visitors under the age of 63. Any visitor under the age of 64 must be accompanied by an adult.        Your procedure is scheduled on: 01/01/22   Report to [facility] Main Entrance    Report to admitting at : 10:15 AM   Call this number if you have problems the morning of surgery [phone]   Do not eat food :After Midnight.   May have liquids until : 10:00 AM   day of surgery  CLEAR LIQUID DIET  Foods Allowed                                                                     Foods Excluded  Water, Black Coffee and tea, regular and decaf                             liquids that you cannot  Plain Jell-O in any flavor  (No red)                                           see through such as: Fruit ices (not with fruit pulp)                                     milk, soups, orange juice              Iced Popsicles (No red)                                    All solid food                                   Apple juices Sports drinks like Gatorade (No red) Lightly seasoned clear broth or consume(fat free) Sugar Sample Menu Breakfast                                Lunch  Supper Cranberry juice                    Beef broth                            Chicken broth Jell-O                                      Grape juice                           Apple juice Coffee or tea                        Jell-O                                      Popsicle                                                Coffee or tea                        Coffee or tea  Complete one Gatorade drink the morning of surgery 3 hours prior to scheduled surgery at: 10:00 AM.    The day of surgery:  Drink ONE (1) Pre-Surgery Clear Ensure or G2 at AM the morning of surgery. Drink in one sitting. Do not sip.  This drink was given to you during your hospital  pre-op appointment visit. Nothing else to drink after completing the  Pre-Surgery Clear Ensure or G2.          If you have questions, please contact your surgeons office. Oral Hygiene is also important to reduce your risk of infection.                                    Remember - BRUSH YOUR TEETH THE MORNING OF SURGERY WITH YOUR REGULAR TOOTHPASTE   Do NOT smoke after Midnight   Take these medicines the morning of surgery with A SIP OF WATER: tikosin,amlodipine.Alprazolam as needed.  DO NOT TAKE ANY ORAL DIABETIC MEDICATIONS DAY OF YOUR SURGERY                              You may not have any metal on your body including hair pins, jewelry, and body piercing             Do not wear make-up, lotions, powders, perfumes/cologne, or deodorant  Do not wear nail polish including gel and S&S, artificial/acrylic nails, or any other type of covering on natural nails including finger and toenails. If you have artificial nails, gel coating, etc. that needs to be removed by a nail salon please have this removed prior to surgery or surgery may need to be canceled/ delayed if the surgeon/ anesthesia feels like they are unable to be safely monitored.   Do not shave  48 hours prior to surgery.    Do  not bring valuables to the hospital. [place].   Contacts, dentures or bridgework may not be worn into surgery.   Bring small  overnight bag day of surgery.    Patients discharged on the day of surgery will not be allowed to drive home.  Someone needs to stay with you for the first 24 hours after anesthesia.   Special Instructions: Bring a copy of your healthcare power of attorney and living will documents         the day of surgery if you haven't scanned them before.              Please read over the following fact sheets you were given: IF YOU HAVE QUESTIONS ABOUT YOUR PRE-OP INSTRUCTIONS PLEASE CALL [phone]     [facility] - Preparing for Surgery Before surgery, you can play an important role.  Because skin is not sterile, your skin needs to be as free of germs as possible.  You can reduce the number of germs on your skin by washing with CHG (chlorahexidine gluconate) soap before surgery.  CHG is an antiseptic cleaner which kills germs and bonds with the skin to continue killing germs even after washing. Please DO NOT use if you have an allergy to CHG or antibacterial soaps.  If your skin becomes reddened/irritated stop using the CHG and inform your nurse when you arrive at Short Stay. Do not shave (including legs and underarms) for at least 48 hours prior to the first CHG shower.  You may shave your face/neck. Please follow these instructions carefully:  1.  Shower with CHG Soap the night before surgery and the  morning of Surgery.  2.  If you choose to wash your hair, wash your hair first as usual with your  normal  shampoo.  3.  After you shampoo, rinse your hair and body thoroughly to remove the  shampoo.                           4.  Use CHG as you would any other liquid soap.  You can apply chg directly  to the skin and wash                       Gently with a scrungie or clean washcloth.  5.  Apply the CHG Soap to your body ONLY FROM THE NECK DOWN.   Do not use on face/ open                           Wound or open sores. Avoid contact with eyes, ears mouth and genitals (private parts).                        Wash face,  Genitals (private parts) with your normal soap.             6.  Wash thoroughly, paying special attention to the area where your surgery  will be performed.  7.  Thoroughly rinse your body with warm water from the neck down.  8.  DO NOT shower/wash with your normal soap after using and rinsing off  the CHG Soap.                9.  Pat yourself dry  with a clean towel.            10.  Wear clean pajamas.            11.  Place clean sheets on your bed the night of your first shower and do not  sleep with pets. Day of Surgery : Do not apply any lotions/deodorants the morning of surgery.  Please wear clean clothes to the hospital/surgery center.  FAILURE TO FOLLOW THESE INSTRUCTIONS MAY RESULT IN THE CANCELLATION OF YOUR SURGERY PATIENT SIGNATURE_________________________________  NURSE SIGNATURE__________________________________  ________________________________________________________________________   Melody Casey  An incentive spirometer is a tool that can help keep your lungs clear and active. This tool measures how well you are filling your lungs with each breath. Taking long deep breaths may help reverse or decrease the chance of developing breathing (pulmonary) problems (especially infection) following: A long period of time when you are unable to move or be active. BEFORE THE PROCEDURE  If the spirometer includes an indicator to show your best effort, your nurse or respiratory therapist will set it to a desired goal. If possible, sit up straight or lean slightly forward. Try not to slouch. Hold the incentive spirometer in an upright position. INSTRUCTIONS FOR USE  Sit on the edge of your bed if possible, or sit up as far as you can in bed or on a chair. Hold the incentive spirometer in an upright position. Breathe out normally. Place the mouthpiece in your mouth and seal your lips tightly around it. Breathe in slowly and as deeply as possible, raising the piston  or the ball toward the top of the column. Hold your breath for 3-5 seconds or for as long as possible. Allow the piston or ball to fall to the bottom of the column. Remove the mouthpiece from your mouth and breathe out normally. Rest for a few seconds and repeat Steps 1 through 7 at least 10 times every 1-2 hours when you are awake. Take your time and take a few normal breaths between deep breaths. The spirometer may include an indicator to show your best effort. Use the indicator as a goal to work toward during each repetition. After each set of 10 deep breaths, practice coughing to be sure your lungs are clear. If you have an incision (the cut made at the time of surgery), support your incision when coughing by placing a pillow or rolled up towels firmly against it. Once you are able to get out of bed, walk around indoors and cough well. You may stop using the incentive spirometer when instructed by your caregiver.  RISKS AND COMPLICATIONS Take your time so you do not get dizzy or light-headed. If you are in pain, you may need to take or ask for pain medication before doing incentive spirometry. It is harder to take a deep breath if you are having pain. AFTER USE Rest and breathe slowly and easily. It can be helpful to keep track of a log of your progress. Your caregiver can provide you with a simple table to help with this. If you are using the spirometer at home, follow these instructions: [place] IF:  You are having difficultly using the spirometer. You have trouble using the spirometer as often as instructed. Your pain medication is not giving enough relief while using the spirometer. You develop fever of 100.5 F (38.1 C) or higher. SEEK IMMEDIATE MEDICAL CARE IF:  You cough up bloody sputum that had not been present before. You develop  fever of 102 F (38.9 C) or greater. You develop worsening pain at or near the incision site. MAKE SURE YOU:  Understand these  instructions. Will watch your condition. Will get help right away if you are not doing well or get worse. Document Released: 04/08/2007 Document Revised: 02/18/2012 Document Reviewed: 06/09/2007 [facility] Patient Information 2014 [place], [place].   ________________________________________________________________________

## 2021-12-25 ENCOUNTER — Other Ambulatory Visit: Payer: Self-pay

## 2021-12-25 ENCOUNTER — Encounter (HOSPITAL_COMMUNITY)
Admission: RE | Admit: 2021-12-25 | Discharge: 2021-12-25 | Disposition: A | Discharge status: Home / Self Care | Payer: Medicare Other | Source: Ambulatory Visit | Attending: Orthopedic Surgery | Admitting: Orthopedic Surgery

## 2021-12-25 ENCOUNTER — Ambulatory Visit (HOSPITAL_COMMUNITY)
Admission: RE | Admit: 2021-12-25 | Discharge: 2021-12-25 | Disposition: A | Discharge status: Home / Self Care | Payer: Medicare Other | Source: Ambulatory Visit | Attending: Orthopedic Surgery | Admitting: Orthopedic Surgery

## 2021-12-25 ENCOUNTER — Encounter (HOSPITAL_COMMUNITY): Payer: Self-pay

## 2021-12-25 VITALS — BP 141/64 | HR 52 | Temp 97.8°F | Resp 18 | Ht 67.0 in | Wt 188.2 lb

## 2021-12-25 DIAGNOSIS — Z01818 Encounter for other preprocedural examination: Secondary | ICD-10-CM

## 2021-12-25 DIAGNOSIS — I10 Essential (primary) hypertension: Secondary | ICD-10-CM | POA: Insufficient documentation

## 2021-12-25 DIAGNOSIS — E118 Type 2 diabetes mellitus with unspecified complications: Secondary | ICD-10-CM

## 2021-12-25 LAB — SURGICAL PCR SCREEN
MRSA, PCR: NEGATIVE
Staphylococcus aureus: NEGATIVE

## 2021-12-25 LAB — CBC
HCT: 38.2 % (ref 36.0–46.0)
Hemoglobin: 12.3 g/dL (ref 12.0–15.0)
MCH: 29.4 pg (ref 26.0–34.0)
MCHC: 32.2 g/dL (ref 30.0–36.0)
MCV: 91.2 fL (ref 80.0–100.0)
Platelets: 191 10*3/uL (ref 150–400)
RBC: 4.19 MIL/uL (ref 3.87–5.11)
RDW: 14.6 % (ref 11.5–15.5)
WBC: 6 10*3/uL (ref 4.0–10.5)
nRBC: 0 % (ref 0.0–0.2)

## 2021-12-25 LAB — BASIC METABOLIC PANEL
Anion gap: 5 (ref 5–15)
BUN: 18 mg/dL (ref 8–23)
CO2: 24 mmol/L (ref 22–32)
Calcium: 10.5 mg/dL — ABNORMAL HIGH (ref 8.9–10.3)
Chloride: 107 mmol/L (ref 98–111)
Creatinine, Ser: 0.75 mg/dL (ref 0.44–1.00)
GFR, Estimated: >60 mL/min (ref >60)
Glucose, Bld: 131 mg/dL — ABNORMAL HIGH (ref 70–99)
Potassium: 4.5 mmol/L (ref 3.5–5.1)
Sodium: 136 mmol/L (ref 135–145)

## 2021-12-25 LAB — GLUCOSE, CAPILLARY: Glucose-Capillary: 128 mg/dL — ABNORMAL HIGH (ref 70–99)

## 2021-12-25 LAB — HEMOGLOBIN A1C
Hgb A1c MFr Bld: 7.1 % — ABNORMAL HIGH (ref 4.8–5.6)
Mean Plasma Glucose: 157.07 mg/dL

## 2021-12-25 NOTE — Anesthesia Preprocedure Evaluation (Addendum)
Anesthesia Evaluation  Patient identified by MRN, date of birth, ID band Patient awake    Reviewed: Allergy & Precautions, NPO status , Patient's Chart, lab work & pertinent test results  Airway Mallampati: II  TM Distance: >3 FB Neck ROM: Full    Dental no notable dental hx.    Pulmonary neg pulmonary ROS, former smoker,    Pulmonary exam normal breath sounds clear to auscultation       Cardiovascular hypertension, Pt. on medications and Pt. on home beta blockers + CAD, + Cardiac Stents and + Peripheral Vascular Disease  Normal cardiovascular exam Rhythm:Regular Rate:Normal     Neuro/Psych TIAnegative psych ROS   GI/Hepatic negative GI ROS, Neg liver ROS,   Endo/Other  negative endocrine ROSdiabetes  Renal/GU negative Renal ROS  negative genitourinary   Musculoskeletal negative musculoskeletal ROS (+)   Abdominal   Peds negative pediatric ROS (+)  Hematology negative hematology ROS (+)   Anesthesia Other Findings   Reproductive/Obstetrics negative OB ROS                            Anesthesia Physical Anesthesia Plan  ASA: 3  Anesthesia Plan: Spinal   Post-op Pain Management: Regional block   Induction: Intravenous  PONV Risk Score and Plan: 2 and Ondansetron, Dexamethasone, Treatment may vary due to age or medical condition and Propofol infusion  Airway Management Planned: Simple Face Mask  Additional Equipment:   Intra-op Plan:   Post-operative Plan:   Informed Consent: I have reviewed the patients History and Physical, chart, labs and discussed the procedure including the risks, benefits and alternatives for the proposed anesthesia with the patient or authorized representative who has indicated his/her understanding and acceptance.     Dental advisory given  Plan Discussed with: CRNA and Surgeon  Anesthesia Plan Comments: (See PAT note 12/25/2021, Konrad Felix  Ward, PA-C)       Anesthesia Quick Evaluation

## 2021-12-25 NOTE — Progress Notes (Addendum)
COVID Vaccine Completed: Yes Date COVID Vaccine completed:11/04/20 x 2 COVID vaccine manufacturer: Moderna    COVID Test: N/A PCP - Dr. Vonna Kotyk Dettinger Cardiologist - Dr. Minus Breeding. : [place]: Laurann Montana: NP: 12/21/21: EPIC  Chest x-ray -  EKG - 12/21/21 Stress Test -  ECHO - 08/13/15 Cardiac Cath -  Pacemaker/ICD device last checked:  Sleep Study -  CPAP -   Fasting Blood Sugar - 100's Checks Blood Sugar ___2__ times a month.  Blood Thinner Instructions: Aspirin Instructions: Last Dose:  Anesthesia review: Hx: AAA,Afib,CAD,DIA,HTN  Patient denies shortness of breath, fever, cough and chest pain at PAT appointment   Patient verbalized understanding of instructions that were given to them at the PAT appointment. Patient was also instructed that they will need to review over the PAT instructions again at home before surgery.

## 2021-12-25 NOTE — Progress Notes (Signed)
Anesthesia Chart Review   Case: [id number] Date/Time: 01/01/22 1245   Procedure: LEFT TOTAL KNEE ARTHROPLASTY (Left: Knee)   Anesthesia type: Spinal   Pre-op diagnosis: LEFT KNEE OSTEOARTHRITIS VARUS   Location: [facility] 06 / [facility]   Surgeons: Frederik Pear, MD       DISCUSSION:76 y.o. former smoker with h/o HTN, DM II (A1C 7.1), atrial fibrillation (on Coumadin), CAD, AAA, left knee OA scheduled for above procedure 01/01/2022 with Dr. Frederik Pear.   Pt last seen by cardiology 12/21/2021. Per OV note, "Preop clearance -upcoming knee surgery with Dr. Mayer Camel. According to the Revised Cardiac Risk Index (RCRI), her Perioperative Risk of Major Cardiac Event is (%): 6.6. Her  Functional Capacity in METs is: 4.3 according to the Duke Activity Status Index (DASI).  She is deemed acceptable risk for the planned procedure without additional cardiovascular testing.  Will route office note to surgical team so they are aware.  In regard to her warfarin is managed by her primary care office Coumadin clinic and they will address holding Coumadin prior to surgery."  Anticipate pt can proceed with planned procedure barring acute status change.   VS: BP (!) 141/64    Pulse (!) 52    Temp 36.6 C (Oral)    Resp 18    Ht 5\' 7"  (1.702 m)    Wt 85.4 kg    SpO2 97%    BMI 29.48 kg/m   PROVIDERS: Dettinger, [name], MD is PCP   Minus Breeding, MD is Cardiologist  LABS: Labs reviewed: Acceptable for surgery. (all labs ordered are listed, but only abnormal results are displayed)  Labs Reviewed  BASIC METABOLIC PANEL - Abnormal; Notable for the following components:      Result Value   Glucose, Bld 131 (*)    Calcium 10.5 (*)    All other components within normal limits  HEMOGLOBIN A1C - Abnormal; Notable for the following components:   Hgb A1c MFr Bld 7.1 (*)    All other components within normal limits  GLUCOSE, CAPILLARY - Abnormal; Notable for the following components:   Glucose-Capillary 128 (*)    All  other components within normal limits  SURGICAL PCR SCREEN  CBC  TYPE AND SCREEN     IMAGES:   EKG: 12/21/2021 Rate 65 bpm  Sinus rhythm with occasional premature ventricular complexes Nonspecific ST and T wave abnormality   CV: Echo 08/13/2015 Study Conclusions   - Left ventricle: The cavity size was normal. There was mild    concentric hypertrophy. Systolic function was normal. The    estimated ejection fraction was in the range of 60% to 65%. Wall    motion was normal; there were no regional wall motion    abnormalities. Left ventricular diastolic function parameters    were normal.  - Pulmonary arteries: PA peak pressure: 32 mm Hg (S).  Past Medical History:  Diagnosis Date   AAA (abdominal aortic aneurysm)    Acute diverticulitis    Anxiety    Arthritis    Atrial fibrillation ([place])    a. Dx 03/2011 - on tikosyn/coumadin.   CAD (coronary artery disease)    a. NSTEMI 02/2011: occ mid Cx, DES to OM2, residual nonobst LAD dz.   Cancer [facility])    breast   Diabetes mellitus    Diverticulitis    Diverticulitis of colon     2008, 04/2011, 12/2014, 08/2015   Dysrhythmia    Foot deformity 1947   right; ??scleroderma  Hemangioma    liver   HLD (hyperlipidemia)    HTN (hypertension)    Osteoporosis    Tobacco abuse    stopped smoking 2012   Ureteral obstruction    History of gross hematuria/right hydronephrosis 2/2 to uteropelvic junction obstruction, s/p cystoscopy in January 2007 with bilateral retrograde pyelography, right ureter arthroscopy, right ureteral stent placement, bladder biopsies, stent removal since then.   Wears dentures    top    Past Surgical History:  Procedure Laterality Date   BIOPSY N/A 11/01/2015   Procedure: BIOPSY;  Surgeon: Danie Binder, MD;  Location: [facility];  Service: Endoscopy;  Laterality: N/A;   BREAST LUMPECTOMY WITH NEEDLE LOCALIZATION AND AXILLARY SENTINEL LYMPH NODE BX Right 03/01/2014   Procedure: BREAST LUMPECTOMY WITH NEEDLE  LOCALIZATION AND AXILLARY SENTINEL LYMPH NODE BX;  Surgeon: Edward Jolly, MD;  Location: [place];  Service: General;  Laterality: Right;   [place]  2012   stent   COLON RESECTION N/A 07/25/2018   Procedure: COLOSTOMY;  Surgeon: Rolm Bookbinder, MD;  Location: [place];  Service: General;  Laterality: N/A;   COLONOSCOPY WITH PROPOFOL N/A 06/29/2014   Dr. Wynetta Emery: universal diverticulosis   CORONARY STENT PLACEMENT  2012   ESOPHAGOGASTRODUODENOSCOPY (EGD) WITH PROPOFOL N/A 11/01/2015   Procedure: ESOPHAGOGASTRODUODENOSCOPY (EGD) WITH PROPOFOL;  Surgeon: Danie Binder, MD;  [facility];  Service: Endoscopy;  Laterality: N/A;   KNEE ARTHROSCOPY     left   LAPAROTOMY N/A 07/25/2018   Procedure: EXPLORATORY LAPAROTOMY;  Surgeon: Rolm Bookbinder, MD;  Location: [place];  Service: General;  Laterality: N/A;   PARTIAL COLECTOMY N/A 07/25/2018   Procedure: COLECTOMY;  Surgeon: Rolm Bookbinder, MD;  Location: [place];  Service: General;  Laterality: N/A;   Right leg surgery  age 38   As a child for  ?scleroderma per patient   TONSILLECTOMY AND ADENOIDECTOMY     TUBAL LIGATION     Ureteral surgery  2011   rt ureterostomy-stent    MEDICATIONS:  ALPRAZolam (XANAX) 0.25 MG tablet   amLODipine (NORVASC) 5 MG tablet   atorvastatin (LIPITOR) 40 MG tablet   cholecalciferol (VITAMIN D3) 25 MCG (1000 UNIT) tablet   dofetilide (TIKOSYN) 250 MCG capsule   hydroxychloroquine (PLAQUENIL) 200 MG tablet   ibuprofen (ADVIL) 200 MG tablet   lisinopril (ZESTRIL) 20 MG tablet   magnesium oxide (MAG-OX) 400 MG tablet   meclizine (ANTIVERT) 25 MG tablet   metoprolol succinate (TOPROL-XL) 25 MG 24 hr tablet   potassium chloride (KLOR-CON) 10 MEQ tablet   warfarin (COUMADIN) 5 MG tablet   No current facility-administered medications for this encounter.   Konrad Felix Ward, PA-C [facility] [phone]

## 2021-12-26 NOTE — Care Plan (Signed)
Ortho Bundle Case Management Note  Patient Details  Name: Melody Casey MRN: [id number] Date of Birth: Oct 21, 1946  Spoke with patient prior to surgery. She will discharge to home with family to assist. HHPT referral to [facility] and OPPT set up with [facility]. Patient and MD in agreement with plan. Choice offered                     DME Arranged:  [facility] rolling DME Agency:  [employer]  HH Arranged:  PT HH Agency:  [place]  Additional Comments: Please contact me with any questions of if this plan should need to change.  Ladell Heads,  [place] [facility]  [phone] 12/26/2021, 2:44 PM

## 2021-12-28 DIAGNOSIS — M1712 Unilateral primary osteoarthritis, left knee: Secondary | ICD-10-CM | POA: Diagnosis present

## 2021-12-28 NOTE — H&P (Signed)
TOTAL KNEE ADMISSION H&P  Patient is being admitted for left total knee arthroplasty.  Subjective:  Chief Complaint:left knee pain.  HPI: Melody Casey, 76 y.o. female, has a history of pain and functional disability in the left knee due to arthritis and has failed non-surgical conservative treatments for greater than 12 weeks to includeNSAID's and/or analgesics, corticosteriod injections, flexibility and strengthening excercises, use of assistive devices, and activity modification.  Onset of symptoms was gradual, starting  several  years ago with gradually worsening course since that time. The patient noted no past surgery on the left knee(s).  Patient currently rates pain in the left knee(s) at 10 out of 10 with activity. Patient has night pain, worsening of pain with activity and weight bearing, pain that interferes with activities of daily living, pain with passive range of motion, crepitus, and joint swelling.  Patient has evidence of subchondral sclerosis, periarticular osteophytes, joint subluxation, and joint space narrowing by imaging studies.  There is no active infection.  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   Herpes zoster without complication 91/66/0600   Edema of left lower extremity    Osteoporosis 08/27/2017   Low vitamin D level 06/04/2017   Microcytic anemia 08/30/2015   Hemangioma    History of TIA (transient ischemic attack) 08/13/2015   Rheumatoid arthritis ([place]) 06/20/2015   AAA (abdominal aortic aneurysm) without rupture ([place]) 01/28/2015   Persistent atrial fibrillation ([place]) 11/17/2014   History of scleroderma 02/17/2014   Malignant neoplasm of upper-outer quadrant of right breast in female, estrogen receptor positive ([place]) 02/01/2014   Anxiety and depression 12/07/2011   High risk medication use 06/14/2011   CAD S/P percutaneous coronary angioplasty    Type 2  diabetes mellitus with complication, without long-term current use of insulin ([place]) 06/07/2008   Dyslipidemia, goal LDL below 70 07/22/2007   Essential hypertension 07/22/2007   Past Medical History:  Diagnosis Date   AAA (abdominal aortic aneurysm)    Acute diverticulitis    Anxiety    Arthritis    Atrial fibrillation ([place])    a. Dx 03/2011 - on tikosyn/coumadin.   CAD (coronary artery disease)    a. NSTEMI 02/2011: occ mid Cx, DES to OM2, residual nonobst LAD dz.   Cancer [facility]    breast   Diabetes mellitus    Diverticulitis    Diverticulitis of colon     2008, 04/2011, 12/2014, 08/2015   Dysrhythmia    Foot deformity 1947   right; ??scleroderma   Hemangioma    liver   HLD (hyperlipidemia)    HTN (hypertension)    Osteoporosis    Tobacco abuse    stopped smoking 2012   Ureteral obstruction    History of gross hematuria/right hydronephrosis 2/2 to uteropelvic junction obstruction, s/p cystoscopy in January 2007 with bilateral retrograde pyelography, right ureter arthroscopy, right ureteral stent placement, bladder biopsies, stent removal since then.   Wears dentures    top    Past Surgical History:  Procedure Laterality Date   BIOPSY N/A 11/01/2015   Procedure: BIOPSY;  Surgeon: Danie Binder, MD;  [facility];  Service: Endoscopy;  Laterality: N/A;   BREAST LUMPECTOMY WITH NEEDLE LOCALIZATION AND AXILLARY SENTINEL LYMPH NODE BX Right 03/01/2014   Procedure: BREAST LUMPECTOMY WITH NEEDLE LOCALIZATION AND AXILLARY SENTINEL LYMPH NODE BX;  Surgeon: Edward Jolly, MD;  Location: [place];  Service: General;  Laterality: Right;   BREAST SURGERY     CARDIAC CATHETERIZATION  2012   stent   COLON RESECTION N/A 07/25/2018   Procedure: COLOSTOMY;  Surgeon: Rolm Bookbinder, MD;  Location: [place];  Service: General;  Laterality: N/A;   COLONOSCOPY WITH PROPOFOL N/A 06/29/2014   Dr. Wynetta Emery: universal diverticulosis   CORONARY STENT PLACEMENT  2012    ESOPHAGOGASTRODUODENOSCOPY (EGD) WITH PROPOFOL N/A 11/01/2015   Procedure: ESOPHAGOGASTRODUODENOSCOPY (EGD) WITH PROPOFOL;  Surgeon: Danie Binder, MD;  [facility];  Service: Endoscopy;  Laterality: N/A;   KNEE ARTHROSCOPY     left   LAPAROTOMY N/A 07/25/2018   Procedure: EXPLORATORY LAPAROTOMY;  Surgeon: Rolm Bookbinder, MD;  Location: [place];  Service: General;  Laterality: N/A;   PARTIAL COLECTOMY N/A 07/25/2018   Procedure: COLECTOMY;  Surgeon: Rolm Bookbinder, MD;  Location: [place];  Service: General;  Laterality: N/A;   Right leg surgery  age 24   As a child for  ?scleroderma per patient   TONSILLECTOMY AND ADENOIDECTOMY     TUBAL LIGATION     Ureteral surgery  2011   rt ureterostomy-stent    No current facility-administered medications for this encounter.   Current Outpatient Medications  Medication Sig Dispense Refill Last Dose   ALPRAZolam (XANAX) 0.25 MG tablet Take 1 tablet (0.25 mg total) by mouth 3 (three) times daily as needed for anxiety. 30 tablet 2    amLODipine (NORVASC) 5 MG tablet TAKE 1 TABLET(5 MG) BY MOUTH DAILY 90 tablet 3    atorvastatin (LIPITOR) 40 MG tablet TAKE 1 AND 1/2 TABLETS BY MOUTH EVERY DAY (Patient taking differently: Take 60 mg by mouth every evening. TAKE 1 AND 1/2 TABLETS BY MOUTH EVERY DAY) 45 tablet 11    cholecalciferol (VITAMIN D3) 25 MCG (1000 UNIT) tablet Take 1,000 Units by mouth daily.      hydroxychloroquine (PLAQUENIL) 200 MG tablet Take 1 tablet (200 mg total) by mouth 2 (two) times daily. 60 tablet 0    ibuprofen (ADVIL) 200 MG tablet Take 600 mg by mouth in the morning and at bedtime.      lisinopril (ZESTRIL) 20 MG tablet Take 1 tablet (20 mg total) by mouth daily. 90 tablet 3    magnesium oxide (MAG-OX) 400 MG tablet Take 400 mg by mouth 2 (two) times daily.      metoprolol succinate (TOPROL-XL) 25 MG 24 hr tablet TAKE 1 TABLET(25 MG) BY MOUTH DAILY 90 tablet 0    potassium chloride (KLOR-CON) 10 MEQ tablet TAKE 1 TABLET BY  MOUTH DAILY 240 tablet 2    warfarin (COUMADIN) 5 MG tablet TAKE 1 AND 1/2 TABLETS(7.5 MG) BY MOUTH DAILY (Patient taking differently: Take 5-7.5 mg by mouth See admin instructions. Take 1.5 tablets (7.5 mg) by mouth on Mondays & Thursdays.  Take 1 tablet (5 mg) by mouth on Sundays, Tuesdays, Wednesdays, Fridays & Saturdays.) 135 tablet 3    dofetilide (TIKOSYN) 250 MCG capsule Take 1 capsule (250 mcg total) by mouth 2 (two) times daily. 180 capsule 3    meclizine (ANTIVERT) 25 MG tablet Take 1 tablet (25 mg total) by mouth 3 (three) times daily as needed for dizziness. 30 tablet 0    Allergies  Allergen Reactions   Miralax [Polyethylene Glycol] Swelling and Rash    Took [facility] brand developed rash. Patient states she tolerated name brand MiraLax in the past.  09/01/15. Patient states she had diffuse swelling including swelling of her lips.    Vancomycin Rash  and Shortness Of Breath   Acetaminophen Hives   Banana Other (See Comments)    Upset stomach   Oxycodone-Acetaminophen Itching   Sulfa Antibiotics Rash    All over rash   Sulfacetamide Sodium Rash    All over rash   Sulfasalazine Rash    All over rash    Latex Rash   Tape Rash    Social History   Tobacco Use   Smoking status: Former    Packs/day: 2.00    Years: 50.00    Pack years: 100.00    Types: Cigarettes    Quit date: 02/08/2011    Years since quitting: 10.8   Smokeless tobacco: Never  Substance Use Topics   Alcohol use: No    Alcohol/week: 0.0 standard drinks    Family History  Problem Relation Age of Onset   Lung cancer Father 6       deceased   Cancer Father    Heart failure Mother 42       deceased   Heart disease Mother    Diabetes Brother    Hypertension Brother    Cancer Brother        prostate   Colon cancer Neg Hx    Liver disease Neg Hx      Review of Systems  Constitutional: Negative.   HENT: Negative.    Eyes: Negative.   Respiratory: Negative.    Cardiovascular: Negative.    Gastrointestinal: Negative.   Endocrine: Negative.   Musculoskeletal:  Positive for arthralgias and myalgias.  Skin: Negative.   Allergic/Immunologic: Negative.   Neurological: Negative.   Hematological: Negative.   Psychiatric/Behavioral: Negative.     Objective:  Physical Exam Constitutional:      Appearance: Normal appearance. She is normal weight.  HENT:     Head: Normocephalic and atraumatic.     Nose: Nose normal.     Mouth/Throat:     Mouth: Mucous membranes are moist.     Pharynx: Oropharynx is clear.  Eyes:     Pupils: Pupils are equal, round, and reactive to light.  Cardiovascular:     Pulses: Normal pulses.  Pulmonary:     Effort: Pulmonary effort is normal.  Musculoskeletal:        General: Tenderness present.     Cervical back: Normal range of motion and neck supple.     Comments: Patient does have a 20 forward flexion contracture to the left knee she does flex to 110 collateral ligaments are stable obvious varus deformity 10 tender along the medial joint line collateral ligaments are stable varus stress exacerbates her pain good quadriceps and hamstring power  Skin:    General: Skin is warm and dry.  Neurological:     General: No focal deficit present.     Mental Status: She is alert and oriented to person, place, and time. Mental status is at baseline.  Psychiatric:        Mood and Affect: Mood normal.        Thought Content: Thought content normal.        Judgment: Judgment normal.    Vital signs in last 24 hours:    Labs:   Estimated body mass index is 29.48 kg/m as calculated from the following:   Height as of 12/25/21: 5\' 7"  (1.702 m).   Weight as of 12/25/21: 85.4 kg.   Imaging Review Plain radiographs demonstrate end-stage arthritis varus deformity bone-on-bone arthritic changes to the medial compartment lateral subluxation of the tibia.  Assessment/Plan:  End stage arthritis, left knee   The patient history, physical examination,  clinical judgment of the provider and imaging studies are consistent with end stage degenerative joint disease of the left knee(s) and total knee arthroplasty is deemed medically necessary. The treatment options including medical management, injection therapy arthroscopy and arthroplasty were discussed at length. The risks and benefits of total knee arthroplasty were presented and reviewed. The risks due to aseptic loosening, infection, stiffness, patella tracking problems, thromboembolic complications and other imponderables were discussed. The patient acknowledged the explanation, agreed to proceed with the plan and consent was signed. Patient is being admitted for inpatient treatment for surgery, pain control, PT, OT, prophylactic antibiotics, VTE prophylaxis, progressive ambulation and ADL's and discharge planning. The patient is planning to be discharged home with home health services     Patient's anticipated LOS is less than 2 midnights, meeting these requirements: - Younger than 13 - Lives within 1 hour of care - Has a competent adult at home to recover with post-op recover - NO history of  - Chronic pain requiring opiods  - Diabetes  - Coronary Artery Disease  - Heart failure  - Heart attack  - Stroke  - DVT/VTE  - Cardiac arrhythmia  - Respiratory Failure/COPD  - Renal failure  - Anemia  - Advanced Liver disease

## 2022-01-01 ENCOUNTER — Ambulatory Visit (HOSPITAL_COMMUNITY): Payer: Medicare Other | Admitting: Physician Assistant

## 2022-01-01 ENCOUNTER — Ambulatory Visit (HOSPITAL_COMMUNITY): Payer: Medicare Other | Admitting: Certified Registered Nurse Anesthetist

## 2022-01-01 ENCOUNTER — Encounter (HOSPITAL_COMMUNITY)
Admission: RE | Disposition: A | Discharge status: Skilled Nursing Facility | Payer: Self-pay | Source: Ambulatory Visit | Attending: Orthopedic Surgery

## 2022-01-01 ENCOUNTER — Inpatient Hospital Stay (HOSPITAL_COMMUNITY)
Admission: RE | Admit: 2022-01-01 | Discharge: 2022-01-04 | DRG: 470 | Disposition: A | Discharge status: Skilled Nursing Facility | Payer: Medicare Other | Source: Ambulatory Visit | Attending: Orthopedic Surgery | Admitting: Orthopedic Surgery

## 2022-01-01 ENCOUNTER — Other Ambulatory Visit: Payer: Self-pay

## 2022-01-01 ENCOUNTER — Encounter (HOSPITAL_COMMUNITY): Payer: Self-pay | Admitting: Orthopedic Surgery

## 2022-01-01 DIAGNOSIS — Z8249 Family history of ischemic heart disease and other diseases of the circulatory system: Secondary | ICD-10-CM | POA: Diagnosis not present

## 2022-01-01 DIAGNOSIS — M1712 Unilateral primary osteoarthritis, left knee: Principal | ICD-10-CM | POA: Diagnosis present

## 2022-01-01 DIAGNOSIS — F32A Depression, unspecified: Secondary | ICD-10-CM | POA: Diagnosis not present

## 2022-01-01 DIAGNOSIS — M2550 Pain in unspecified joint: Secondary | ICD-10-CM | POA: Diagnosis not present

## 2022-01-01 DIAGNOSIS — I252 Old myocardial infarction: Secondary | ICD-10-CM

## 2022-01-01 DIAGNOSIS — G8929 Other chronic pain: Secondary | ICD-10-CM | POA: Diagnosis present

## 2022-01-01 DIAGNOSIS — M069 Rheumatoid arthritis, unspecified: Secondary | ICD-10-CM | POA: Diagnosis present

## 2022-01-01 DIAGNOSIS — R7989 Other specified abnormal findings of blood chemistry: Secondary | ICD-10-CM | POA: Diagnosis not present

## 2022-01-01 DIAGNOSIS — Z79899 Other long term (current) drug therapy: Secondary | ICD-10-CM | POA: Diagnosis not present

## 2022-01-01 DIAGNOSIS — Z955 Presence of coronary angioplasty implant and graft: Secondary | ICD-10-CM | POA: Diagnosis not present

## 2022-01-01 DIAGNOSIS — Z87891 Personal history of nicotine dependence: Secondary | ICD-10-CM

## 2022-01-01 DIAGNOSIS — E119 Type 2 diabetes mellitus without complications: Secondary | ICD-10-CM | POA: Diagnosis present

## 2022-01-01 DIAGNOSIS — I10 Essential (primary) hypertension: Secondary | ICD-10-CM | POA: Diagnosis present

## 2022-01-01 DIAGNOSIS — M810 Age-related osteoporosis without current pathological fracture: Secondary | ICD-10-CM | POA: Diagnosis present

## 2022-01-01 DIAGNOSIS — Z801 Family history of malignant neoplasm of trachea, bronchus and lung: Secondary | ICD-10-CM

## 2022-01-01 DIAGNOSIS — I2510 Atherosclerotic heart disease of native coronary artery without angina pectoris: Secondary | ICD-10-CM | POA: Diagnosis present

## 2022-01-01 DIAGNOSIS — M21162 Varus deformity, not elsewhere classified, left knee: Secondary | ICD-10-CM | POA: Diagnosis not present

## 2022-01-01 DIAGNOSIS — I4819 Other persistent atrial fibrillation: Secondary | ICD-10-CM | POA: Diagnosis present

## 2022-01-01 DIAGNOSIS — Z833 Family history of diabetes mellitus: Secondary | ICD-10-CM | POA: Diagnosis not present

## 2022-01-01 DIAGNOSIS — G8918 Other acute postprocedural pain: Secondary | ICD-10-CM | POA: Diagnosis not present

## 2022-01-01 DIAGNOSIS — D62 Acute posthemorrhagic anemia: Secondary | ICD-10-CM | POA: Diagnosis not present

## 2022-01-01 DIAGNOSIS — Z96652 Presence of left artificial knee joint: Secondary | ICD-10-CM | POA: Diagnosis not present

## 2022-01-01 DIAGNOSIS — Z853 Personal history of malignant neoplasm of breast: Secondary | ICD-10-CM | POA: Diagnosis not present

## 2022-01-01 DIAGNOSIS — K219 Gastro-esophageal reflux disease without esophagitis: Secondary | ICD-10-CM | POA: Diagnosis not present

## 2022-01-01 DIAGNOSIS — E785 Hyperlipidemia, unspecified: Secondary | ICD-10-CM | POA: Diagnosis not present

## 2022-01-01 DIAGNOSIS — Z20822 Contact with and (suspected) exposure to covid-19: Secondary | ICD-10-CM | POA: Diagnosis not present

## 2022-01-01 DIAGNOSIS — D1800 Hemangioma unspecified site: Secondary | ICD-10-CM | POA: Diagnosis not present

## 2022-01-01 DIAGNOSIS — F419 Anxiety disorder, unspecified: Secondary | ICD-10-CM | POA: Diagnosis not present

## 2022-01-01 DIAGNOSIS — Z7901 Long term (current) use of anticoagulants: Secondary | ICD-10-CM

## 2022-01-01 DIAGNOSIS — Z933 Colostomy status: Secondary | ICD-10-CM | POA: Diagnosis not present

## 2022-01-01 DIAGNOSIS — Z471 Aftercare following joint replacement surgery: Secondary | ICD-10-CM | POA: Diagnosis not present

## 2022-01-01 DIAGNOSIS — Z7401 Bed confinement status: Secondary | ICD-10-CM | POA: Diagnosis not present

## 2022-01-01 HISTORY — PX: TOTAL KNEE ARTHROPLASTY: SHX125

## 2022-01-01 LAB — RESP PANEL BY RT-PCR (FLU A&B, COVID) ARPGX2
Influenza A by PCR: NEGATIVE
Influenza B by PCR: NEGATIVE
SARS Coronavirus 2 by RT PCR: NEGATIVE

## 2022-01-01 LAB — CBC
HCT: 35 % — ABNORMAL LOW (ref 36.0–46.0)
Hemoglobin: 11.5 g/dL — ABNORMAL LOW (ref 12.0–15.0)
MCH: 29.8 pg (ref 26.0–34.0)
MCHC: 32.9 g/dL (ref 30.0–36.0)
MCV: 90.7 fL (ref 80.0–100.0)
Platelets: 141 10*3/uL — ABNORMAL LOW (ref 150–400)
RBC: 3.86 MIL/uL — ABNORMAL LOW (ref 3.87–5.11)
RDW: 14.6 % (ref 11.5–15.5)
WBC: 6.7 10*3/uL (ref 4.0–10.5)
nRBC: 0 % (ref 0.0–0.2)

## 2022-01-01 LAB — GLUCOSE, CAPILLARY
Glucose-Capillary: 152 mg/dL — ABNORMAL HIGH (ref 70–99)
Glucose-Capillary: 152 mg/dL — ABNORMAL HIGH (ref 70–99)

## 2022-01-01 LAB — TYPE AND SCREEN
ABO/RH(D): B POS
Antibody Screen: NEGATIVE

## 2022-01-01 LAB — CREATININE, SERUM
Creatinine, Ser: 0.6 mg/dL (ref 0.44–1.00)
GFR, Estimated: >60 mL/min (ref >60)

## 2022-01-01 SURGERY — ARTHROPLASTY, KNEE, TOTAL
Anesthesia: Spinal | Site: Knee | Laterality: Left

## 2022-01-01 MED ORDER — CEFAZOLIN SODIUM-DEXTROSE 2-4 GM/100ML-% IV SOLN
2.0000 g | INTRAVENOUS | Status: AC
  Administered 2022-01-01: 2 g via INTRAVENOUS
  Filled 2022-01-01: qty 100

## 2022-01-01 MED ORDER — FENTANYL CITRATE PF 50 MCG/ML IJ SOSY
25.0000 ug | PREFILLED_SYRINGE | INTRAMUSCULAR | Status: DC | PRN
  Administered 2022-01-01: 25 ug via INTRAVENOUS

## 2022-01-01 MED ORDER — ONDANSETRON HCL 4 MG/2ML IJ SOLN: 4.0000 mg | Freq: Once | INTRAMUSCULAR | Status: AC | PRN

## 2022-01-01 MED ORDER — MAGNESIUM OXIDE -MG SUPPLEMENT 400 (240 MG) MG PO TABS
400.0000 mg | ORAL_TABLET | Freq: Two times a day (BID) | ORAL | Status: DC
  Administered 2022-01-01 – 2022-01-04 (×6): 400 mg via ORAL
  Filled 2022-01-01 (×7): qty 1

## 2022-01-01 MED ORDER — WARFARIN SODIUM 5 MG PO TABS
7.5000 mg | ORAL_TABLET | Freq: Once | ORAL | Status: AC
  Administered 2022-01-01: 7.5 mg via ORAL
  Filled 2022-01-01: qty 1

## 2022-01-01 MED ORDER — ATORVASTATIN CALCIUM 40 MG PO TABS
60.0000 mg | ORAL_TABLET | Freq: Every evening | ORAL | Status: DC
  Administered 2022-01-01 – 2022-01-03 (×3): 60 mg via ORAL
  Filled 2022-01-01 (×3): qty 1

## 2022-01-01 MED ORDER — MIDAZOLAM HCL 2 MG/2ML IJ SOLN
0.5000 mg | Freq: Once | INTRAMUSCULAR | Status: AC
  Administered 2022-01-01: 0.5 mg via INTRAVENOUS

## 2022-01-01 MED ORDER — METOCLOPRAMIDE HCL 5 MG/ML IJ SOLN: 5.0000 mg | Freq: Three times a day (TID) | INTRAMUSCULAR | Status: DC | PRN

## 2022-01-01 MED ORDER — DEXAMETHASONE SODIUM PHOSPHATE 10 MG/ML IJ SOLN
10.0000 mg | Freq: Once | INTRAMUSCULAR | Status: AC
  Administered 2022-01-02: 08:00:00 10 mg via INTRAVENOUS
  Filled 2022-01-01: qty 1

## 2022-01-01 MED ORDER — BUPIVACAINE LIPOSOME 1.3 % IJ SUSP
INTRAMUSCULAR | Status: AC
  Filled 2022-01-01: qty 20

## 2022-01-01 MED ORDER — TRANEXAMIC ACID 1000 MG/10ML IV SOLN
INTRAVENOUS | Status: DC | PRN
  Administered 2022-01-01: 2000 mg via TOPICAL

## 2022-01-01 MED ORDER — KCL IN DEXTROSE-NACL 20-5-0.45 MEQ/L-%-% IV SOLN
INTRAVENOUS | Status: DC
  Filled 2022-01-01 (×3): qty 1000

## 2022-01-01 MED ORDER — BUPIVACAINE LIPOSOME 1.3 % IJ SUSP
INTRAMUSCULAR | Status: DC | PRN
  Administered 2022-01-01: 20 mL

## 2022-01-01 MED ORDER — WARFARIN - PHARMACIST DOSING INPATIENT: Freq: Every day | Status: DC

## 2022-01-01 MED ORDER — BISACODYL 5 MG PO TBEC: 5.0000 mg | DELAYED_RELEASE_TABLET | Freq: Every day | ORAL | Status: DC | PRN

## 2022-01-01 MED ORDER — HYDROMORPHONE HCL 1 MG/ML IJ SOLN
INTRAMUSCULAR | Status: AC
  Filled 2022-01-01: qty 1

## 2022-01-01 MED ORDER — BUPIVACAINE IN DEXTROSE 0.75-8.25 % IT SOLN
INTRATHECAL | Status: DC | PRN
Start: 2022-01-01 — End: 2022-01-01
  Administered 2022-01-01: 1.6 mL via INTRATHECAL

## 2022-01-01 MED ORDER — ONDANSETRON HCL 4 MG/2ML IJ SOLN: 4.0000 mg | Freq: Four times a day (QID) | INTRAMUSCULAR | Status: DC | PRN

## 2022-01-01 MED ORDER — ORAL CARE MOUTH RINSE: 15.0000 mL | Freq: Once | OROMUCOSAL | Status: AC

## 2022-01-01 MED ORDER — ENOXAPARIN SODIUM 40 MG/0.4ML IJ SOSY: 40.0000 mg | PREFILLED_SYRINGE | INTRAMUSCULAR | 0 refills | Status: DC

## 2022-01-01 MED ORDER — TRANEXAMIC ACID 1000 MG/10ML IV SOLN
2000.0000 mg | INTRAVENOUS | Status: DC
  Filled 2022-01-01: qty 20

## 2022-01-01 MED ORDER — HYDROMORPHONE HCL 2 MG PO TABS
2.0000 mg | ORAL_TABLET | ORAL | Status: DC | PRN
  Administered 2022-01-01 – 2022-01-02 (×6): 2 mg via ORAL
  Filled 2022-01-01 (×6): qty 1

## 2022-01-01 MED ORDER — ONDANSETRON HCL 4 MG/2ML IJ SOLN
INTRAMUSCULAR | Status: AC
  Administered 2022-01-01: 4 mg via INTRAVENOUS
  Filled 2022-01-01: qty 2

## 2022-01-01 MED ORDER — LISINOPRIL 20 MG PO TABS
20.0000 mg | ORAL_TABLET | Freq: Every day | ORAL | Status: DC
Start: 2022-01-02 — End: 2022-01-04
  Administered 2022-01-02 – 2022-01-04 (×3): 20 mg via ORAL
  Filled 2022-01-01 (×3): qty 1

## 2022-01-01 MED ORDER — POVIDONE-IODINE 10 % EX SWAB
2.0000 "application " | Freq: Once | CUTANEOUS | Status: AC
  Administered 2022-01-01: 2 via TOPICAL

## 2022-01-01 MED ORDER — ONDANSETRON HCL 4 MG PO TABS: 4.0000 mg | ORAL_TABLET | Freq: Four times a day (QID) | ORAL | Status: DC | PRN

## 2022-01-01 MED ORDER — LACTATED RINGERS IV BOLUS: 250.0000 mL | Freq: Once | INTRAVENOUS | Status: DC

## 2022-01-01 MED ORDER — HYDROMORPHONE HCL 1 MG/ML IJ SOLN
0.2500 mg | INTRAMUSCULAR | Status: DC | PRN
  Administered 2022-01-01 (×4): 0.25 mg via INTRAVENOUS

## 2022-01-01 MED ORDER — BUPIVACAINE-EPINEPHRINE (PF) 0.25% -1:200000 IJ SOLN
INTRAMUSCULAR | Status: AC
  Filled 2022-01-01: qty 30

## 2022-01-01 MED ORDER — FENTANYL CITRATE PF 50 MCG/ML IJ SOSY
50.0000 ug | PREFILLED_SYRINGE | INTRAMUSCULAR | Status: DC
  Administered 2022-01-01: 50 ug via INTRAVENOUS
  Filled 2022-01-01: qty 2

## 2022-01-01 MED ORDER — ALPRAZOLAM 0.25 MG PO TABS
0.2500 mg | ORAL_TABLET | Freq: Three times a day (TID) | ORAL | Status: DC | PRN
  Administered 2022-01-01 – 2022-01-03 (×3): 0.25 mg via ORAL
  Filled 2022-01-01 (×3): qty 1

## 2022-01-01 MED ORDER — PROPOFOL 10 MG/ML IV BOLUS
INTRAVENOUS | Status: AC
  Filled 2022-01-01: qty 20

## 2022-01-01 MED ORDER — ONDANSETRON HCL 4 MG/2ML IJ SOLN
INTRAMUSCULAR | Status: AC
  Filled 2022-01-01: qty 2

## 2022-01-01 MED ORDER — PHENOL 1.4 % MT LIQD: 1.0000 | OROMUCOSAL | Status: DC | PRN

## 2022-01-01 MED ORDER — POTASSIUM CHLORIDE CRYS ER 10 MEQ PO TBCR
10.0000 meq | EXTENDED_RELEASE_TABLET | Freq: Every day | ORAL | Status: DC
Start: 2022-01-01 — End: 2022-01-04
  Administered 2022-01-01 – 2022-01-04 (×4): 10 meq via ORAL
  Filled 2022-01-01 (×4): qty 1

## 2022-01-01 MED ORDER — DEXAMETHASONE SODIUM PHOSPHATE 10 MG/ML IJ SOLN
INTRAMUSCULAR | Status: DC | PRN
  Administered 2022-01-01: 5 mg via INTRAVENOUS

## 2022-01-01 MED ORDER — ALUM & MAG HYDROXIDE-SIMETH 200-200-20 MG/5ML PO SUSP: 30.0000 mL | ORAL | Status: DC | PRN

## 2022-01-01 MED ORDER — LIDOCAINE HCL (CARDIAC) PF 100 MG/5ML IV SOSY
PREFILLED_SYRINGE | INTRAVENOUS | Status: DC | PRN
  Administered 2022-01-01: 60 mg via INTRAVENOUS

## 2022-01-01 MED ORDER — ROPIVACAINE HCL 5 MG/ML IJ SOLN
INTRAMUSCULAR | Status: DC | PRN
  Administered 2022-01-01: 20 mL via PERINEURAL

## 2022-01-01 MED ORDER — TRANEXAMIC ACID-NACL 1000-0.7 MG/100ML-% IV SOLN: 1000.0000 mg | Freq: Once | INTRAVENOUS | Status: DC

## 2022-01-01 MED ORDER — ENOXAPARIN SODIUM 40 MG/0.4ML IJ SOSY
40.0000 mg | PREFILLED_SYRINGE | INTRAMUSCULAR | Status: DC
  Administered 2022-01-02 – 2022-01-04 (×3): 40 mg via SUBCUTANEOUS
  Filled 2022-01-01 (×3): qty 0.4

## 2022-01-01 MED ORDER — WATER FOR IRRIGATION, STERILE IR SOLN
Status: DC | PRN
  Administered 2022-01-01: 2000 mL

## 2022-01-01 MED ORDER — LACTATED RINGERS IV BOLUS
500.0000 mL | Freq: Once | INTRAVENOUS | Status: AC
  Administered 2022-01-01: 500 mL via INTRAVENOUS

## 2022-01-01 MED ORDER — SODIUM CHLORIDE (PF) 0.9 % IJ SOLN
INTRAMUSCULAR | Status: DC | PRN
  Administered 2022-01-01: 50 mL

## 2022-01-01 MED ORDER — LACTATED RINGERS IV SOLN: INTRAVENOUS | Status: DC

## 2022-01-01 MED ORDER — METOCLOPRAMIDE HCL 5 MG PO TABS: 5.0000 mg | ORAL_TABLET | Freq: Three times a day (TID) | ORAL | Status: DC | PRN

## 2022-01-01 MED ORDER — FENTANYL CITRATE PF 50 MCG/ML IJ SOSY
PREFILLED_SYRINGE | INTRAMUSCULAR | Status: AC
  Filled 2022-01-01: qty 2

## 2022-01-01 MED ORDER — PANTOPRAZOLE SODIUM 40 MG PO TBEC
40.0000 mg | DELAYED_RELEASE_TABLET | Freq: Every day | ORAL | Status: DC
  Administered 2022-01-01 – 2022-01-04 (×4): 40 mg via ORAL
  Filled 2022-01-01 (×4): qty 1

## 2022-01-01 MED ORDER — MECLIZINE HCL 25 MG PO TABS: 25.0000 mg | ORAL_TABLET | Freq: Three times a day (TID) | ORAL | Status: DC | PRN

## 2022-01-01 MED ORDER — PROPOFOL 1000 MG/100ML IV EMUL
INTRAVENOUS | Status: AC
  Filled 2022-01-01: qty 100

## 2022-01-01 MED ORDER — HYDROMORPHONE HCL 1 MG/ML IJ SOLN
INTRAMUSCULAR | Status: AC
  Administered 2022-01-01: 0.5 mg via INTRAVENOUS
  Filled 2022-01-01: qty 1

## 2022-01-01 MED ORDER — MIDAZOLAM HCL 2 MG/2ML IJ SOLN
INTRAMUSCULAR | Status: AC
  Filled 2022-01-01: qty 2

## 2022-01-01 MED ORDER — HYDROXYCHLOROQUINE SULFATE 200 MG PO TABS
200.0000 mg | ORAL_TABLET | Freq: Two times a day (BID) | ORAL | Status: DC
  Administered 2022-01-02 – 2022-01-04 (×5): 200 mg via ORAL
  Filled 2022-01-01 (×8): qty 1

## 2022-01-01 MED ORDER — PROPOFOL 10 MG/ML IV BOLUS
INTRAVENOUS | Status: DC | PRN
  Administered 2022-01-01 (×8): 20 mg via INTRAVENOUS

## 2022-01-01 MED ORDER — DIPHENHYDRAMINE HCL 12.5 MG/5ML PO ELIX: 12.5000 mg | ORAL_SOLUTION | ORAL | Status: DC | PRN

## 2022-01-01 MED ORDER — ONDANSETRON HCL 4 MG/2ML IJ SOLN
INTRAMUSCULAR | Status: DC | PRN
  Administered 2022-01-01: 4 mg via INTRAVENOUS

## 2022-01-01 MED ORDER — METOPROLOL SUCCINATE ER 25 MG PO TB24
25.0000 mg | ORAL_TABLET | Freq: Every day | ORAL | Status: DC
Start: 2022-01-01 — End: 2022-01-04
  Administered 2022-01-01 – 2022-01-03 (×3): 25 mg via ORAL
  Filled 2022-01-01 (×3): qty 1

## 2022-01-01 MED ORDER — HYDROMORPHONE HCL 1 MG/ML IJ SOLN
0.5000 mg | INTRAMUSCULAR | Status: DC | PRN
  Administered 2022-01-01: 0.5 mg via INTRAVENOUS

## 2022-01-01 MED ORDER — BUPIVACAINE LIPOSOME 1.3 % IJ SUSP: 20.0000 mL | Freq: Once | INTRAMUSCULAR | Status: DC

## 2022-01-01 MED ORDER — FLEET ENEMA 7-19 GM/118ML RE ENEM: 1.0000 | ENEMA | Freq: Once | RECTAL | Status: DC | PRN

## 2022-01-01 MED ORDER — HYDROMORPHONE HCL 2 MG PO TABS: 2.0000 mg | ORAL_TABLET | ORAL | 0 refills | Status: DC | PRN

## 2022-01-01 MED ORDER — BUPIVACAINE-EPINEPHRINE (PF) 0.25% -1:200000 IJ SOLN
INTRAMUSCULAR | Status: DC | PRN
  Administered 2022-01-01: 30 mL

## 2022-01-01 MED ORDER — MENTHOL 3 MG MT LOZG: 1.0000 | LOZENGE | OROMUCOSAL | Status: DC | PRN

## 2022-01-01 MED ORDER — DOCUSATE SODIUM 100 MG PO CAPS
100.0000 mg | ORAL_CAPSULE | Freq: Two times a day (BID) | ORAL | Status: DC
  Administered 2022-01-02 – 2022-01-03 (×4): 100 mg via ORAL
  Filled 2022-01-01 (×6): qty 1

## 2022-01-01 MED ORDER — DOFETILIDE 250 MCG PO CAPS
250.0000 ug | ORAL_CAPSULE | Freq: Two times a day (BID) | ORAL | Status: DC
Start: 2022-01-01 — End: 2022-01-04
  Administered 2022-01-01 – 2022-01-04 (×6): 250 ug via ORAL
  Filled 2022-01-01 (×6): qty 1

## 2022-01-01 MED ORDER — AMLODIPINE BESYLATE 5 MG PO TABS
5.0000 mg | ORAL_TABLET | Freq: Every day | ORAL | Status: DC
  Administered 2022-01-02 – 2022-01-04 (×3): 5 mg via ORAL
  Filled 2022-01-01 (×3): qty 1

## 2022-01-01 MED ORDER — PHENYLEPHRINE 40 MCG/ML (10ML) SYRINGE FOR IV PUSH (FOR BLOOD PRESSURE SUPPORT)
PREFILLED_SYRINGE | INTRAVENOUS | Status: DC | PRN
  Administered 2022-01-01 (×2): 80 ug via INTRAVENOUS

## 2022-01-01 MED ORDER — SODIUM CHLORIDE 0.9 % IR SOLN
Status: DC | PRN
  Administered 2022-01-01: 1000 mL

## 2022-01-01 MED ORDER — CHLORHEXIDINE GLUCONATE 0.12 % MT SOLN
15.0000 mL | Freq: Once | OROMUCOSAL | Status: AC
  Administered 2022-01-01: 15 mL via OROMUCOSAL

## 2022-01-01 MED ORDER — POLYETHYLENE GLYCOL 3350 17 G PO PACK: 17.0000 g | PACK | Freq: Every day | ORAL | Status: DC | PRN

## 2022-01-01 MED ORDER — TRANEXAMIC ACID-NACL 1000-0.7 MG/100ML-% IV SOLN
1000.0000 mg | INTRAVENOUS | Status: AC
  Administered 2022-01-01: 1000 mg via INTRAVENOUS
  Filled 2022-01-01: qty 100

## 2022-01-01 MED ORDER — LIDOCAINE HCL (PF) 2 % IJ SOLN
INTRAMUSCULAR | Status: AC
  Filled 2022-01-01: qty 5

## 2022-01-01 MED ORDER — TIZANIDINE HCL 2 MG PO TABS
2.0000 mg | ORAL_TABLET | Freq: Four times a day (QID) | ORAL | 0 refills | Status: DC | PRN
Start: 2022-01-01 — End: 2022-01-04

## 2022-01-01 MED ORDER — PROPOFOL 500 MG/50ML IV EMUL
INTRAVENOUS | Status: DC | PRN
Start: 2022-01-01 — End: 2022-01-01
  Administered 2022-01-01: 75 ug/kg/min via INTRAVENOUS

## 2022-01-01 SURGICAL SUPPLY — 48 items
ATTUNE MED DOME PAT 38 KNEE (Knees) ×1 IMPLANT
ATTUNE PS FEM LT SZ 7 CEM KNEE (Femur) ×1 IMPLANT
ATTUNE PSRP INSR SZ7 6 KNEE (Insert) ×1 IMPLANT
BAG COUNTER SPONGE SURGICOUNT (BAG) ×1 IMPLANT
BAG DECANTER FOR FLEXI CONT (MISCELLANEOUS) ×2 IMPLANT
BAG ZIPLOCK 12X15 (MISCELLANEOUS) ×2 IMPLANT
BASE TIBIAL ROT PLAT SZ 7 KNEE (Knees) IMPLANT
BLADE SAG 18X100X1.27 (BLADE) ×2 IMPLANT
BLADE SAW SGTL 11.0X1.19X90.0M (BLADE) ×2 IMPLANT
BLADE SURG SZ10 CARB STEEL (BLADE) ×4 IMPLANT
BNDG ELASTIC 6X10 VLCR STRL LF (GAUZE/BANDAGES/DRESSINGS) ×2 IMPLANT
BOWL SMART MIX CTS (DISPOSABLE) ×2 IMPLANT
CEMENT HV SMART SET (Cement) ×4 IMPLANT
COVER SURGICAL LIGHT HANDLE (MISCELLANEOUS) ×2 IMPLANT
CUFF TOURN SGL QUICK 34 (TOURNIQUET CUFF) ×2
CUFF TRNQT CYL 34X4.125X (TOURNIQUET CUFF) ×1 IMPLANT
DECANTER SPIKE VIAL GLASS SM (MISCELLANEOUS) ×6 IMPLANT
DRAPE INCISE IOBAN 66X45 STRL (DRAPES) ×2 IMPLANT
DRAPE U-SHAPE 47X51 STRL (DRAPES) ×2 IMPLANT
DRSG AQUACEL AG ADV 3.5X10 (GAUZE/BANDAGES/DRESSINGS) ×2 IMPLANT
DURAPREP 26ML APPLICATOR (WOUND CARE) ×2 IMPLANT
ELECT REM PT RETURN 15FT ADLT (MISCELLANEOUS) ×2 IMPLANT
GLOVE SRG 8 PF TXTR STRL LF DI (GLOVE) ×1 IMPLANT
GLOVE SURG ENC MOIS LTX SZ7.5 (GLOVE) ×2 IMPLANT
GLOVE SURG ENC MOIS LTX SZ8.5 (GLOVE) ×2 IMPLANT
GLOVE SURG UNDER POLY LF SZ8 (GLOVE) ×2
GLOVE SURG UNDER POLY LF SZ9 (GLOVE) ×2 IMPLANT
GOWN STRL REUS W/TWL XL LVL3 (GOWN DISPOSABLE) ×4 IMPLANT
HANDPIECE INTERPULSE COAX TIP (DISPOSABLE) ×2
HOOD PEEL AWAY FLYTE STAYCOOL (MISCELLANEOUS) ×6 IMPLANT
KIT TURNOVER KIT A (KITS) ×1 IMPLANT
NDL HYPO 21X1.5 SAFETY (NEEDLE) ×2 IMPLANT
NEEDLE HYPO 21X1.5 SAFETY (NEEDLE) ×4 IMPLANT
NS IRRIG 1000ML POUR BTL (IV SOLUTION) ×2 IMPLANT
PACK TOTAL KNEE CUSTOM (KITS) ×2 IMPLANT
PROTECTOR NERVE ULNAR (MISCELLANEOUS) ×2 IMPLANT
SET HNDPC FAN SPRY TIP SCT (DISPOSABLE) ×1 IMPLANT
SUT VIC AB 1 CTX 36 (SUTURE) ×2
SUT VIC AB 1 CTX36XBRD ANBCTR (SUTURE) ×1 IMPLANT
SUT VIC AB 3-0 CT1 27 (SUTURE) ×6
SUT VIC AB 3-0 CT1 TAPERPNT 27 (SUTURE) ×3 IMPLANT
SYR CONTROL 10ML LL (SYRINGE) ×4 IMPLANT
TIBIAL BASE ROT PLAT SZ 7 KNEE (Knees) ×2 IMPLANT
TRAY CATH INTERMITTENT SS 16FR (CATHETERS) ×1 IMPLANT
TRAY FOLEY MTR SLVR 16FR STAT (SET/KITS/TRAYS/PACK) ×1 IMPLANT
TUBE SUCTION HIGH CAP CLEAR NV (SUCTIONS) ×1 IMPLANT
WATER STERILE IRR 1000ML POUR (IV SOLUTION) ×4 IMPLANT
WRAP KNEE MAXI GEL POST OP (GAUZE/BANDAGES/DRESSINGS) ×2 IMPLANT

## 2022-01-01 NOTE — Op Note (Signed)
PATIENT ID:      Melody Casey  MRN:     [id number] DOB/AGE:    [birth year] / 76 y.o.       OPERATIVE REPORT   DATE OF PROCEDURE:  01/01/2022      PREOPERATIVE DIAGNOSIS:   LEFT KNEE OSTEOARTHRITIS VARUS      Estimated body mass index is 29.49 kg/m as calculated from the following:   Height as of this encounter: 5\' 7"  (1.702 m).   Weight as of this encounter: 85.4 kg.                                                       POSTOPERATIVE DIAGNOSIS:   Same                                                                  PROCEDURE:  Procedure(s): LEFT TOTAL KNEE ARTHROPLASTY Using DepuyAttune RP implants #7L Femur, #7Tibia, 6 mm Attune RP bearing, 38 Patella    SURGEON: Kerin Salen  ASSISTANT:   Kerry Hough. [employer]   (Present and scrubbed throughout the case, critical for assistance with exposure, retraction, instrumentation, and closure.)        ANESTHESIA: Spinal, 20cc Exparel, 50cc 0.25% Marcaine EBL: 350 cc FLUID REPLACEMENT: 1500 cc crystaloid TOURNIQUET: DRAINS: None TRANEXAMIC ACID: 1gm IV, 2gm topical COMPLICATIONS:  None         INDICATIONS FOR PROCEDURE: The patient has  LEFT KNEE OSTEOARTHRITIS VARUS, Var deformities, XR shows bone on bone arthritis, lateral subluxation of tibia. Patient has failed all conservative measures including anti-inflammatory medicines, narcotics, attempts at exercise and weight loss, cortisone injections and viscosupplementation.  Risks and benefits of surgery have been discussed, questions answered.   DESCRIPTION OF PROCEDURE: The patient identified by armband, received  IV antibiotics, in the holding area at [facility]. Patient taken to the operating room, appropriate anesthetic monitors were attached, and Spinal anesthesia was  induced. IV Tranexamic acid was given.Tourniquet applied high to the operative thigh. Lateral post and foot positioner applied to the table, the lower extremity was then prepped and draped in usual sterile fashion  from the toes to the tourniquet. Time-out procedure was performed. Kerry Hough. [facility] PAC, was present and scrubbed throughout the case, critical for assistance with, positioning, exposure, retraction, instrumentation, and closure.The skin and subcutaneous tissue along the incision was injected with 20 cc of a mixture of Exparel and Marcaine solution, using a 20-gauge by 1-1/2 inch needle. We began the operation, with the knee flexed 130 degrees, by making the anterior midline incision starting at handbreadth above the patella going over the patella 1 cm medial to and 4 cm distal to the tibial tubercle. Small bleeders in the skin and the subcutaneous tissue identified and cauterized. Transverse retinaculum was incised and reflected medially and a medial parapatellar arthrotomy was accomplished. the patella was everted and theprepatellar fat pad resected. The superficial medial collateral ligament was then elevated from anterior to posterior along the proximal flare of the tibia and anterior half of the menisci resected. The knee was hyperflexed exposing bone on bone  arthritis. Peripheral and notch osteophytes as well as the cruciate ligaments were then resected. We continued to work our way around posteriorly along the proximal tibia, and externally rotated the tibia subluxing it out from underneath the femur. A McHale PCL retractor was placed through the notch and a lateral Hohmann retractor placed, and we then entered the proximal tibia in line with the Depuy starter drill in line with the axis of the tibia followed by an intramedullary guide rod and 0-degree posterior slope cutting guide. The tibial cutting guide, 4 degree posterior sloped, was pinned into place allowing resection of 7 mm of bone medially and 14 mm of bone laterally. Satisfied with the tibial resection, we then entered the distal femur 2 mm anterior to the PCL origin with the intramedullary guide rod and applied the distal femoral cutting guide set  at 11 mm, with 5 degrees of valgus. This was pinned along the epicondylar axis. At this point, the distal femoral cut was accomplished without difficulty. We then sized for a #7L femoral component and pinned the guide in 3 degrees of external rotation. The chamfer cutting guide was pinned into place. The anterior, posterior, and chamfer cuts were accomplished without difficulty followed by the Attune RP box cutting guide and the box cut. We also removed posterior osteophytes from the posterior femoral condyles. The posterior capsule was injected with Exparel solution. The knee was brought into full extension. We checked our extension gap and fit a 6 mm bearing. Distracting in extension with a lamina spreader,  bleeders in the posterior capsule, Posterior medial and posterior lateral gutter were cauterized.  The transexamic acid-soaked sponge was then placed in the gap of the knee in extension. The knee was flexed 30. The posterior patella cut was accomplished with the 9.5 mm Attune cutting guide, sized for a 56mm dome, and the fixation pegs drilled.The knee was then once again hyperflexed exposing the proximal tibia. We sized for a # 7 tibial base plate, applied the smokestack and the conical reamer followed by the the Delta fin keel punch. We then hammered into place the Attune RP trial femoral component, drilled the lugs, inserted a  6 mm trial bearing, trial patellar button, and took the knee through range of motion from 0-130 degrees. Medial and lateral ligamentous stability was checked. No thumb pressure was required for patellar Tracking. The tourniquet was not used. All trial components were removed, mating surfaces irrigated with pulse lavage, and dried with suction and sponges. 10 cc of the Exparel solution was applied to the cancellus bone of the patella distal femur and proximal tibia.  After waiting 30 seconds, the bony surfaces were again, dried with sponges. A double batch of DePuy HV cement was mixed  and applied to all bony metallic mating surfaces except for the posterior condyles of the femur itself. In order, we hammered into place the tibial tray and removed excess cement, the femoral component and removed excess cement. The final Attune RP bearing was inserted, and the knee brought to full extension with compression. The patellar button was clamped into place, and excess cement removed. The knee was held at 30 flexion with compression, while the cement cured. The wound was irrigated out with normal saline solution pulse lavage. The rest of the Exparel was injected into the parapatellar arthrotomy, subcutaneous tissues, and periosteal tissues. The parapatellar arthrotomy was closed with running #1 Vicryl suture. The subcutaneous tissue with 3-0 undyed Vicryl suture, and the skin with running 3-0 SQ vicryl. An  Aquacil and Ace wrap were applied. The patient was taken to recovery room without difficulty.   Kerin Salen 01/01/2022, 10:26 AM

## 2022-01-01 NOTE — Anesthesia Postprocedure Evaluation (Signed)
Anesthesia Post Note  Patient: Melody Casey  Procedure(s) Performed: LEFT TOTAL KNEE ARTHROPLASTY (Left: Knee)     Patient location during evaluation: PACU Anesthesia Type: Spinal Level of consciousness: oriented and awake and alert Pain management: pain level controlled Vital Signs Assessment: post-procedure vital signs reviewed and stable Respiratory status: spontaneous breathing, respiratory function stable and patient connected to nasal cannula oxygen Cardiovascular status: blood pressure returned to baseline and stable Postop Assessment: no headache, no backache and no apparent nausea or vomiting Anesthetic complications: no   No notable events documented.  Last Vitals:  Vitals:   01/01/22 1430 01/01/22 1432  BP:  (!) 144/71  Pulse: (!) 58 64  Resp: 15 14  Temp:    SpO2: 95% 95%    Last Pain:  Vitals:   01/01/22 1443  TempSrc:   PainSc: 6                  [name]

## 2022-01-01 NOTE — Discharge Instructions (Signed)

## 2022-01-01 NOTE — Progress Notes (Signed)
Orthopedic Tech Progress Note Patient Details:  Melody Casey Apr 28, 1946 [id number]  Ortho Devices Type of Ortho Device: Bone foam zero knee Ortho Device/Splint Interventions: Application   Post Interventions Patient Tolerated: Well Instructions Provided: Care of device  [name] 01/01/2022, 3:54 PM

## 2022-01-01 NOTE — Progress Notes (Signed)
Assisted Dr. Kalman Shan with left, ultrasound guided, adductor canal block. Side rails up, monitors on throughout procedure. See vital signs in flow sheet. Tolerated Procedure well.

## 2022-01-01 NOTE — Plan of Care (Signed)
Discussed with patient about plan of care for post-op day 0.   Will continue to monitor patient.    [place], Therapist, sports

## 2022-01-01 NOTE — Interval H&P Note (Signed)
History and Physical Interval Note:  01/01/2022 10:25 AM  Melody Casey  has presented today for surgery, with the diagnosis of LEFT KNEE OSTEOARTHRITIS VARUS.  The various methods of treatment have been discussed with the patient and family. After consideration of risks, benefits and other options for treatment, the patient has consented to  Procedure(s): LEFT TOTAL KNEE ARTHROPLASTY (Left) as a surgical intervention.  The patient's history has been reviewed, patient examined, no change in status, stable for surgery.  I have reviewed the patient's chart and labs.  Questions were answered to the patient's satisfaction.     Kerin Salen

## 2022-01-01 NOTE — Transfer of Care (Signed)
Immediate Anesthesia Transfer of Care Note  Patient: Melody Casey  Procedure(s) Performed: LEFT TOTAL KNEE ARTHROPLASTY (Left: Knee)  Patient Location: PACU  Anesthesia Type:Spinal  Level of Consciousness: awake, drowsy and patient cooperative  Airway & Oxygen Therapy: Patient Spontanous Breathing and Patient connected to face mask oxygen  Post-op Assessment: Report given to RN and Post -op Vital signs reviewed and stable  Post vital signs: Reviewed and stable  Last Vitals:  Vitals Value Taken Time  BP 121/76 01/01/22 1405  Temp    Pulse 71 01/01/22 1407  Resp 15 01/01/22 1407  SpO2 95 % 01/01/22 1407  Vitals shown include unvalidated device data.  Last Pain:  Vitals:   01/01/22 1139  TempSrc:   PainSc: 0-No pain         Complications: No notable events documented.

## 2022-01-01 NOTE — Anesthesia Procedure Notes (Signed)
Spinal  Patient location during procedure: OR Start time: 01/01/2022 11:47 AM End time: 01/01/2022 11:50 AM Reason for block: surgical anesthesia Staffing Performed: resident/CRNA  Anesthesiologist: Myrtie Soman, MD Resident/CRNA: Raenette Rover, CRNA Preanesthetic Checklist Completed: patient identified, IV checked, site marked, risks and benefits discussed, surgical consent, monitors and equipment checked, pre-op evaluation and timeout performed Spinal Block Patient position: sitting Prep: DuraPrep Patient monitoring: blood pressure, continuous pulse ox and heart rate Approach: midline Location: L3-4 Injection technique: single-shot Needle Needle type: Pencan  Needle gauge: 24 G Assessment Sensory level: T6 Events: CSF return

## 2022-01-01 NOTE — Anesthesia Procedure Notes (Signed)
Anesthesia Procedure Image    

## 2022-01-01 NOTE — Progress Notes (Signed)
PT Cancellation Note  Patient Details Name: Melody Casey MRN: [id number] DOB: 1946/04/15   Cancelled Treatment:   Pt arrived late to unit from PACU, unable to see pt for POD0 session with PT, will see pt first thing in the morning.     Clide Dales 01/01/2022, 6:52 PM

## 2022-01-01 NOTE — Anesthesia Procedure Notes (Signed)
Anesthesia Regional Block: Adductor canal block   Pre-Anesthetic Checklist: , timeout performed,  Correct Patient, Correct Site, Correct Laterality,  Correct Procedure, Correct Position, site marked,  Risks and benefits discussed,  Surgical consent,  Pre-op evaluation,  At surgeon's request and post-op pain management  Laterality: Left  Prep: chloraprep       Needles:  Injection technique: Single-shot  Needle Type: Echogenic Needle     Needle Length: 9cm      Additional Needles:   Procedures:,,,, ultrasound used (permanent image in chart),,    Narrative:  Start time: 01/01/2022 11:30 AM End time: 01/01/2022 11:35 AM  Performed by: Personally  Anesthesiologist: Myrtie Soman, MD  Additional Notes: Patient tolerated the procedure well without complications

## 2022-01-01 NOTE — Progress Notes (Signed)
ANTICOAGULATION CONSULT NOTE - Initial Consult  Pharmacy Consult for warfarin Indication: atrial fibrillation  Allergies  Allergen Reactions   Miralax [Polyethylene Glycol] Swelling and Rash    Took [facility] brand developed rash. Patient states she tolerated name brand MiraLax in the past.  09/01/15. Patient states she had diffuse swelling including swelling of her lips.    Vancomycin Rash and Shortness Of Breath   Acetaminophen Hives   Banana Other (See Comments)    Upset stomach   Oxycodone-Acetaminophen Itching   Sulfa Antibiotics Rash    All over rash   Sulfacetamide Sodium Rash    All over rash   Sulfasalazine Rash    All over rash    Latex Rash   Tape Rash    Patient Measurements: Height: 5\' 7"  (170.2 cm) Weight: 85.4 kg (188 lb 4.4 oz) IBW/kg (Calculated) : 61.6 Heparin Dosing Weight:   Vital Signs: Temp: 97.8 F (36.6 C) (01/23 1745) Temp Source: Oral (01/23 1048) BP: 148/60 (01/23 1745) Pulse Rate: 86 (01/23 1745)  Labs: No results for input(s): HGB, HCT, PLT, APTT, LABPROT, INR, HEPARINUNFRC, HEPRLOWMOCWT, CREATININE, CKTOTAL, CKMB, TROPONINIHS in the last 72 hours.  Estimated Creatinine Clearance: 66.1 mL/min (by C-G formula based on SCr of 0.75 mg/dL).   Medical History: Past Medical History:  Diagnosis Date   AAA (abdominal aortic aneurysm)    Acute diverticulitis    Anxiety    Arthritis    Atrial fibrillation ([place])    a. Dx 03/2011 - on tikosyn/coumadin.   CAD (coronary artery disease)    a. NSTEMI 02/2011: occ mid Cx, DES to OM2, residual nonobst LAD dz.   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   Wears dentures    top    Medications:  Medications Prior to Admission  Medication Sig Dispense Refill Last Dose   ALPRAZolam (XANAX) 0.25 MG tablet Take 1 tablet (0.25 mg total) by mouth 3 (three) times daily as needed for anxiety. 30 tablet 2 Past Week   amLODipine (NORVASC) 5 MG tablet TAKE 1 TABLET(5 MG) BY MOUTH DAILY 90 tablet 3 01/01/2022 at 0730   atorvastatin (LIPITOR) 40 MG tablet TAKE 1 AND 1/2 TABLETS BY MOUTH EVERY DAY (Patient taking differently: Take 60 mg by mouth every evening. TAKE 1 AND 1/2 TABLETS BY MOUTH EVERY DAY) 45 tablet 11 12/31/2021   cholecalciferol (VITAMIN D3) 25 MCG (1000 UNIT) tablet Take 1,000 Units by mouth daily.   Past Week   dofetilide (TIKOSYN) 250 MCG capsule Take 1 capsule (250 mcg total) by mouth 2 (two) times daily. 180 capsule 3 01/01/2022 at 0730   hydroxychloroquine (PLAQUENIL) 200 MG tablet Take 1 tablet (200 mg total) by mouth 2 (two) times daily. 60 tablet 0 12/31/2021   ibuprofen (ADVIL) 200 MG tablet Take 600 mg by mouth in the morning and at bedtime.   Past Month   lisinopril (ZESTRIL) 20  MG tablet Take 1 tablet (20 mg total) by mouth daily. 90 tablet 3 12/31/2021   magnesium oxide (MAG-OX) 400 MG tablet Take 400 mg by mouth 2 (two) times daily.   12/31/2021   meclizine (ANTIVERT) 25 MG tablet Take 1 tablet (25 mg total) by mouth 3 (three) times daily as needed for dizziness. 30 tablet 0 Past Month   metoprolol succinate (TOPROL-XL) 25 MG 24 hr tablet TAKE 1 TABLET(25 MG) BY MOUTH DAILY 90 tablet 0 12/31/2021 at 1900   potassium chloride (KLOR-CON) 10 MEQ tablet TAKE 1 TABLET BY MOUTH DAILY 240 tablet 2 Past Week   warfarin (COUMADIN) 5 MG tablet TAKE 1 AND 1/2 TABLETS(7.5 MG) BY MOUTH DAILY (Patient taking differently: Take 5-7.5 mg by mouth See  admin instructions. Take 1.5 tablets (7.5 mg) by mouth on Mondays & Thursdays.  Take 1 tablet (5 mg) by mouth on Sundays, Tuesdays, Wednesdays, Fridays & Saturdays.) 135 tablet 3 12/27/2021    Assessment: 76 yo F on warfarin PTA for Afib.  Warfarin managed by Dr Dettinger at [facility].  Last INR 12/21 was therapeutic on dose of 7.5 Mon/Thursday, 5 mg all other days. She was instructed to hold warfarin 5 days preop. No labs today. S/p L TKR   Goal of Therapy:  INR 2-3 Monitor platelets by anticoagulation protocol: Yes   Plan:  Warfarin 7.5 mg po x 1 dose tonight Daily INR LMWH 40 qday until INR > 2  Eudelia Bunch, Pharm.D 01/01/2022 6:25 PM

## 2022-01-02 ENCOUNTER — Encounter (HOSPITAL_COMMUNITY): Payer: Self-pay | Admitting: Orthopedic Surgery

## 2022-01-02 LAB — BASIC METABOLIC PANEL
Anion gap: 5 (ref 5–15)
BUN: 13 mg/dL (ref 8–23)
CO2: 23 mmol/L (ref 22–32)
Calcium: 9.5 mg/dL (ref 8.9–10.3)
Chloride: 105 mmol/L (ref 98–111)
Creatinine, Ser: 0.57 mg/dL (ref 0.44–1.00)
GFR, Estimated: >60 mL/min (ref >60)
Glucose, Bld: 216 mg/dL — ABNORMAL HIGH (ref 70–99)
Potassium: 4.2 mmol/L (ref 3.5–5.1)
Sodium: 133 mmol/L — ABNORMAL LOW (ref 135–145)

## 2022-01-02 LAB — PROTIME-INR
INR: 1.2 (ref 0.8–1.2)
Prothrombin Time: 14.8 seconds (ref 11.4–15.2)

## 2022-01-02 LAB — CBC
HCT: 32.3 % — ABNORMAL LOW (ref 36.0–46.0)
Hemoglobin: 10.8 g/dL — ABNORMAL LOW (ref 12.0–15.0)
MCH: 30 pg (ref 26.0–34.0)
MCHC: 33.4 g/dL (ref 30.0–36.0)
MCV: 89.7 fL (ref 80.0–100.0)
Platelets: 142 10*3/uL — ABNORMAL LOW (ref 150–400)
RBC: 3.6 MIL/uL — ABNORMAL LOW (ref 3.87–5.11)
RDW: 14.5 % (ref 11.5–15.5)
WBC: 7.4 10*3/uL (ref 4.0–10.5)
nRBC: 0 % (ref 0.0–0.2)

## 2022-01-02 MED ORDER — METHOCARBAMOL 1000 MG/10ML IJ SOLN
500.0000 mg | Freq: Four times a day (QID) | INTRAVENOUS | Status: DC | PRN
  Filled 2022-01-02: qty 5

## 2022-01-02 MED ORDER — WARFARIN SODIUM 5 MG PO TABS
7.5000 mg | ORAL_TABLET | Freq: Once | ORAL | Status: AC
  Administered 2022-01-02: 16:00:00 7.5 mg via ORAL
  Filled 2022-01-02: qty 1

## 2022-01-02 MED ORDER — TRAMADOL HCL 50 MG PO TABS
50.0000 mg | ORAL_TABLET | Freq: Four times a day (QID) | ORAL | Status: DC | PRN
  Administered 2022-01-03 – 2022-01-04 (×5): 50 mg via ORAL
  Filled 2022-01-02 (×5): qty 1

## 2022-01-02 MED ORDER — METHOCARBAMOL 500 MG PO TABS
500.0000 mg | ORAL_TABLET | Freq: Four times a day (QID) | ORAL | Status: DC | PRN
  Administered 2022-01-02 (×2): 500 mg via ORAL
  Filled 2022-01-02 (×2): qty 1

## 2022-01-02 MED ORDER — METHOCARBAMOL 1000 MG/10ML IJ SOLN: 500.0000 mg | Freq: Four times a day (QID) | INTRAVENOUS | Status: DC | PRN

## 2022-01-02 NOTE — Progress Notes (Signed)
ANTICOAGULATION CONSULT NOTE - Follow Up Consult  Pharmacy Consult for Warfarin Indication: atrial fibrillation  Allergies  Allergen Reactions   Miralax [Polyethylene Glycol] Swelling and Rash    Took [facility] brand developed rash. Patient states she tolerated name brand MiraLax in the past.  09/01/15. Patient states she had diffuse swelling including swelling of her lips.    Vancomycin Rash and Shortness Of Breath   Acetaminophen Hives   Banana Other (See Comments)    Upset stomach   Oxycodone-Acetaminophen Itching   Sulfa Antibiotics Rash    All over rash   Sulfacetamide Sodium Rash    All over rash   Sulfasalazine Rash    All over rash    Latex Rash   Tape Rash    Patient Measurements: Height: 5\' 7"  (170.2 cm) Weight: 85.4 kg (188 lb 4.4 oz) IBW/kg (Calculated) : 61.6 Heparin Dosing Weight:   Vital Signs: Temp: 98.5 F (36.9 C) 2023-01-04 1118) Temp Source: Oral January 04, 2023 0605) BP: 159/63 2023-01-04 1118) Pulse Rate: 70 January 04, 2023 1118)  Labs: Recent Labs    01/01/22 1855 01-04-2022 0336  HGB 11.5* 10.8*  HCT 35.0* 32.3*  PLT 141* 142*  LABPROT  --  14.8  INR  --  1.2  CREATININE 0.60 0.57    Estimated Creatinine Clearance: 66.1 mL/min (by C-G formula based on SCr of 0.57 mg/dL).   Medications:  Scheduled:   amLODipine  5 mg Oral Daily   atorvastatin  60 mg Oral QPM   docusate sodium  100 mg Oral BID   dofetilide  250 mcg Oral BID   enoxaparin (LOVENOX) injection  40 mg Subcutaneous Q24H   hydroxychloroquine  200 mg Oral BID   lisinopril  20 mg Oral Daily   magnesium oxide  400 mg Oral BID   metoprolol succinate  25 mg Oral QHS   pantoprazole  40 mg Oral Daily   potassium chloride  10 mEq Oral Daily   Warfarin - Pharmacist Dosing Inpatient   Does not apply q1600     Assessment: 76 yo F on warfarin PTA for Afib.  Warfarin managed by Dr Dettinger at [facility].  Last INR 12/21 was therapeutic on dose of 7.5 Mon/Thursday, 5 mg all other days. She was  instructed to hold warfarin 5 days preop. S/p L TKR on 01/01/22.  Today, 01-04-2022: INR 1.2 CBC:  Hgb deceased to 10.8 (no bleeding reported, suspect postop ABLA); Plt low/stable at 142k Drug-drug interactions:  TXA 2g topical intra-op on 1/23, TXA 1g IV postop not given.    Goal of Therapy:  INR 2-3 Monitor platelets by anticoagulation protocol: Yes   Plan:  Warfarin 7.5 mg PO today at 16:00 Daily INR LMWH 40 qday until INR > 2    Gretta Arab PharmD, BCPS Clinical Pharmacist [facility] main pharmacy [phone] Jan 04, 2022 1:09 PM

## 2022-01-02 NOTE — TOC Transition Note (Signed)
Transition of Care [facility]) - CM/SW Discharge Note  Patient Details  Name: Melody Casey MRN: [id number] Date of Birth: September 17, 1946  [facility] of [facility]) CM/SW Contact:  Sherie Don, LCSW Phone Number: 01/02/2022, 9:37 AM  Clinical Narrative: Patient is expected to discharge home after working with PT. CSW met with patient to confirm discharge plan. Patient will discharge home with HHPT through [facility] and then transition to [place]. Note initially had patient set up at [facility] but patient reported she requested that she be changed to [facility] PT since it is closer to her. Patient has a rolling walker, shower chair, cane, elevated toilets, and a BSC (which is in her garage) at home, so there are no DME needs at this time. TOC signing off.  Final next level of care: [place] Barriers to Discharge: No Barriers Identified  Patient Goals and CMS Choice Patient states their goals for this hospitalization and ongoing recovery are:: Discharge home with HHPT and then transition to OPPT Choice offered to / list presented to : Patient  Discharge Plan and Services         DME Arranged: N/A DME Agency: NA HH Arranged: PT HH Agency: [facility] Representative spoke with at [facility]: Prearranged in orthopedist's office  Readmission Risk Interventions No flowsheet data found.

## 2022-01-02 NOTE — Evaluation (Signed)
Physical Therapy Evaluation Patient Details Name: Melody Casey MRN: [id number] DOB: 17-Apr-1946 Today's Date: 01/02/2022  History of Present Illness  Pt is a 76 year old female s/p Left TKA on 01/01/22.  PMHx: HTN, DM, breast cancer, CAD, afib, colostomy  Clinical Impression  Pt is s/p TKA resulting in the deficits listed below (see PT Problem List). Pt will benefit from skilled PT to increase their independence and safety with mobility to allow discharge to the venue listed below.  Pt reporting increased left knee pain however tangled in lines and had wet linen.  Pt encouraged to at least stand at bedside this morning for change of linen.  Pt requiring at least mod assist +2 for safety due to increased pain.  Pt plans to d/c home however does not feel ready to d/c home today as pain is not controlled and spouse would not be able to provide current level of assist.        Recommendations for follow up therapy are one component of a multi-disciplinary discharge planning process, led by the attending physician.  Recommendations may be updated based on patient status, additional functional criteria and insurance authorization.  Follow Up Recommendations Follow physician's recommendations for discharge plan and follow up therapies    Assistance Recommended at Discharge    Patient can return home with the following  A lot of help with walking and/or transfers;A lot of help with bathing/dressing/bathroom;Help with stairs or ramp for entrance    Equipment Recommendations None recommended by PT  Recommendations for Other Services       Functional Status Assessment Patient has had a recent decline in their functional status and demonstrates the ability to make significant improvements in function in a reasonable and predictable amount of time.     Precautions / Restrictions Precautions Precautions: Fall;Knee Precaution Comments: colostomy Restrictions Weight Bearing Restrictions: No Other  Position/Activity Restrictions: WBAT      Mobility  Bed Mobility Overal bed mobility: Needs Assistance Bed Mobility: Supine to Sit, Sit to Supine     Supine to sit: Mod assist Sit to supine: Mod assist   General bed mobility comments: assist for Lt LE and scooting to EOB; assist for bil LEs onto bed    Transfers Overall transfer level: Needs assistance Equipment used: Rolling walker (2 wheels) Transfers: Sit to/from Stand Sit to Stand: Mod assist, From elevated surface, +2 safety/equipment           General transfer comment: required elevated bed, unable to maintain standing due to increased pain    Ambulation/Gait                  Stairs            Wheelchair Mobility    Modified Rankin (Stroke Patients Only)       Balance                                             Pertinent Vitals/Pain Pain Assessment Pain Assessment: 0-10 Pain Score: 10-Worst pain ever Pain Location: left knee Pain Descriptors / Indicators: Sore, Aching Pain Intervention(s): Monitored during session, Repositioned, Premedicated before session, Patient requesting pain meds-RN notified    Home Living Family/patient expects to be discharged to:: Private residence Living Arrangements: Spouse/significant other Available Help at Discharge: Family;Available 24 hours/day Type of Home: House Home Access: Stairs to enter Entrance Stairs-Rails: Right;Left;Can  reach both Entrance Stairs-Number of Steps: 3   Home Layout: One level Home Equipment: Conservation officer, nature (2 wheels);BSC/3in1      Prior Function Prior Level of Function : Independent/Modified Independent                     Hand Dominance        Extremity/Trunk Assessment        Lower Extremity Assessment Lower Extremity Assessment: LLE deficits/detail LLE Deficits / Details: observed LE resting in 10* flexion, all movement causes increase pain LLE: Unable to fully assess due to pain        Communication   Communication: No difficulties  Cognition Arousal/Alertness: Awake/alert Behavior During Therapy: WFL for tasks assessed/performed Overall Cognitive Status: Within Functional Limits for tasks assessed                                          General Comments      Exercises     Assessment/Plan    PT Assessment Patient needs continued PT services  PT Problem List Decreased strength;Decreased range of motion;Decreased balance;Decreased mobility;Decreased knowledge of precautions;Pain;Decreased knowledge of use of DME       PT Treatment Interventions Stair training;Gait training;DME instruction;Therapeutic exercise;Balance training;Functional mobility training;Therapeutic activities;Patient/family education    PT Goals (Current goals can be found in the Care Plan section)  Acute Rehab PT Goals PT Goal Formulation: With patient Time For Goal Achievement: 01/09/22 Potential to Achieve Goals: Good    Frequency 7X/week     Co-evaluation               AM-PAC PT "6 Clicks" Mobility  Outcome Measure Help needed turning from your back to your side while in a flat bed without using bedrails?: A Lot Help needed moving from lying on your back to sitting on the side of a flat bed without using bedrails?: A Lot Help needed moving to and from a bed to a chair (including a wheelchair)?: A Lot Help needed standing up from a chair using your arms (e.g., wheelchair or bedside chair)?: A Lot Help needed to walk in hospital room?: Total Help needed climbing 3-5 steps with a railing? : Total 6 Click Score: 10    End of Session Equipment Utilized During Treatment: Gait belt Activity Tolerance: Patient limited by pain Patient left: with call bell/phone within reach;with nursing/sitter in room;in bed Nurse Communication: Mobility status PT Visit Diagnosis: Other abnormalities of gait and mobility (R26.89)    Time: 1015-1030 PT Time Calculation  (min) (ACUTE ONLY): 15 min   Charges:   PT Evaluation $PT Eval Low Complexity: 1 Low        [name], DPT Acute Rehabilitation Services Pager: [phone] Office: [phone]'  Kati L Payson 01/02/2022, 1:02 PM

## 2022-01-02 NOTE — Progress Notes (Signed)
Received a call from the patient's daughter, Melody Casey [phone], re: her mother's post-operative care/disposition.  She stated her mom had gone to inpatient rehab following her colostomy surgery last May.  She stated she felt this would be the best option for her mom this time, too, since she would have minimal help at home.  She requested CSW facilitate moving her mom to CIR.  I explained the process to her and informed her the decision would need to begin with the surgeon first, based partly on PT recommendation, before CSW could pursue CIR.  Mrs. Melody Casey is requesting someone call her re: her mother's care so she can discuss disposition.  Thank you for contacting her to participate in discharge planning.

## 2022-01-02 NOTE — Progress Notes (Signed)
Physical Therapy Treatment Patient Details Name: Melody Casey MRN: [id number] DOB: 1946-11-12 Today's Date: 01/02/2022   History of Present Illness Pt is a 76 year old female s/p Left TKA on 01/01/22.  PMHx: HTN, DM, breast cancer, CAD, afib, colostomy    PT Comments    Pt requiring increased assist of max +2 for stand pivot from bed to recliner this afternoon.  Pt reports left knee pain limiting ability to mobilize.  Pt reports she does not want to d/c to SNF however not able to d/c home at this time due to assist level.  RN notified.    Recommendations for follow up therapy are one component of a multi-disciplinary discharge planning process, led by the attending physician.  Recommendations may be updated based on patient status, additional functional criteria and insurance authorization.  Follow Up Recommendations  Follow physician's recommendations for discharge plan and follow up therapies     Assistance Recommended at Discharge Frequent or constant Supervision/Assistance  Patient can return home with the following Help with stairs or ramp for entrance;Two people to help with walking and/or transfers;Two people to help with bathing/dressing/bathroom   Equipment Recommendations  None recommended by PT    Recommendations for Other Services       Precautions / Restrictions Precautions Precautions: Fall;Knee Precaution Comments: colostomy, hx right foot deformity Restrictions Weight Bearing Restrictions: No Other Position/Activity Restrictions: WBAT     Mobility  Bed Mobility Overal bed mobility: Needs Assistance Bed Mobility: Supine to Sit     Supine to sit: Mod assist Sit to supine: Mod assist   General bed mobility comments: assist for Lt LE and scooting to EOB    Transfers Overall transfer level: Needs assistance Equipment used: Rolling walker (2 wheels) Transfers: Sit to/from Stand, Bed to chair/wheelchair/BSC Sit to Stand: Max assist, +2 physical assistance,  From elevated surface Stand pivot transfers: Mod assist, +2 physical assistance         General transfer comment: required elevated bed, attempted twice, pt attempting to lift up on Rt LE and with RW despite cues to "push down" for support and steadying, limited by left knee pain per pt    Ambulation/Gait                   Stairs             Wheelchair Mobility    Modified Rankin (Stroke Patients Only)       Balance                                            Cognition Arousal/Alertness: Awake/alert Behavior During Therapy: WFL for tasks assessed/performed Overall Cognitive Status: Within Functional Limits for tasks assessed                                 General Comments: reports she keeps thinking she is at home        Exercises      General Comments        Pertinent Vitals/Pain Pain Assessment Pain Assessment: 0-10 Pain Score: 10-Worst pain ever Pain Location: left knee Pain Descriptors / Indicators: Sore, Aching Pain Intervention(s): Repositioned, Monitored during session    Home Living Family/patient expects to be discharged to:: Private residence Living Arrangements: Spouse/significant other Available Help at Discharge: Family;Available 24 hours/day  Type of Home: House Home Access: Stairs to enter Entrance Stairs-Rails: Right;Left;Can reach both Entrance Stairs-Number of Steps: 3   Home Layout: One level Home Equipment: Conservation officer, nature (2 wheels);BSC/3in1      Prior Function            PT Goals (current goals can now be found in the care plan section) Acute Rehab PT Goals PT Goal Formulation: With patient Time For Goal Achievement: 01/09/22 Potential to Achieve Goals: Good Progress towards PT goals: Progressing toward goals    Frequency    7X/week      PT Plan Current plan remains appropriate    Co-evaluation              AM-PAC PT "6 Clicks" Mobility   Outcome Measure   Help needed turning from your back to your side while in a flat bed without using bedrails?: A Lot Help needed moving from lying on your back to sitting on the side of a flat bed without using bedrails?: A Lot Help needed moving to and from a bed to a chair (including a wheelchair)?: A Lot Help needed standing up from a chair using your arms (e.g., wheelchair or bedside chair)?: A Lot Help needed to walk in hospital room?: Total Help needed climbing 3-5 steps with a railing? : Total 6 Click Score: 10    End of Session Equipment Utilized During Treatment: Gait belt Activity Tolerance: Patient limited by pain Patient left: with call bell/phone within reach;in bed;with chair alarm set Nurse Communication: Mobility status;Patient requests pain meds PT Visit Diagnosis: Other abnormalities of gait and mobility (R26.89)     Time: 5027-7412 PT Time Calculation (min) (ACUTE ONLY): 23 min  Charges:  $Therapeutic Activity: 23-37 mins                     Jannette Spanner PT, DPT Acute Rehabilitation Services Pager: [phone] Office: [place] 01/02/2022, 3:42 PM

## 2022-01-02 NOTE — Progress Notes (Signed)
PATIENT ID: Melody Casey  MRN: [id number]  DOB/AGE:  [birth year] / 78 y.o.  1 Day Post-Op Procedure(s) (LRB): LEFT TOTAL KNEE ARTHROPLASTY (Left)    PROGRESS NOTE Subjective: Patient is alert, oriented, no Nausea, no Vomiting, yes passing gas. Taking PO well. Denies SOB, Chest or Calf Pain. Using Incentive Spirometer, PAS in place. Ambulate WBAT, Patient reports pain as 4/10 .    Objective: Vital signs in last 24 hours: Vitals:   01/01/22 1800 01/01/22 2144 01/02/22 0142 01/02/22 0605  BP:  (!) 152/81 (!) 154/76 (!) 165/67  Pulse: 79 76 60 82  Resp:  16 16 16   Temp:  (!) 97.4 F (36.3 C) 97.6 F (36.4 C) 97.8 F (36.6 C)  TempSrc:  Oral Oral Oral  SpO2: 96% 93% 94% 96%  Weight:      Height:          Intake/Output from previous day: I/O last 3 completed shifts: In: 3160.7 [P.O.:100; I.V.:2960.7; IV Piggyback:100] Out: 1150 [Urine:1000; Blood:150]   Intake/Output this shift: No intake/output data recorded.   LABORATORY DATA: Recent Labs    01/01/22 1039 01/01/22 1429 01/01/22 1855 01/02/22 0336  WBC  --   --  6.7 7.4  HGB  --   --  11.5* 10.8*  HCT  --   --  35.0* 32.3*  PLT  --   --  141* 142*  NA  --   --   --  133*  K  --   --   --  4.2  CL  --   --   --  105  CO2  --   --   --  23  BUN  --   --   --  13  CREATININE  --   --  0.60 0.57  GLUCOSE  --   --   --  216*  GLUCAP 152* 152*  --   --   INR  --   --   --  1.2  CALCIUM  --   --   --  9.5    Examination: Neurologically intact ABD soft Neurovascular intact Sensation intact distally Intact pulses distally Dorsiflexion/Plantar flexion intact Incision: dressing C/D/I No cellulitis present Compartment soft}  Assessment:   1 Day Post-Op Procedure(s) (LRB): LEFT TOTAL KNEE ARTHROPLASTY (Left) ADDITIONAL DIAGNOSIS: Expected Acute Blood Loss Anemia, Hypertension and Cardiac Arrythmia afib , hs scleroderma RLE.  Patient's anticipated LOS is less than 2 midnights, meeting these requirements: - Younger  than 69 - Lives within 1 hour of care - Has a competent adult at home to recover with post-op recover - NO history of  - Chronic pain requiring opiods  - Diabetes  - Coronary Artery Disease  - Heart failure  - Heart attack  - Stroke  - DVT/VTE  - Cardiac arrhythmia  - Respiratory Failure/COPD  - Renal failure  - Anemia  - Advanced Liver disease     Plan: PT/OT WBAT, AROM and PROM  DVT Prophylaxis:  SCDx72hrs, lovenox to coumadin bridge DISCHARGE PLAN: Home, when passes PT DISCHARGE NEEDS: HHPT, Walker, and 3-in-1 comode seat     Kerin Salen 01/02/2022, 7:27 AM  Patient ID: Melody Casey, female   DOB: 05-10-1946, 76 y.o.   MRN: [id number]

## 2022-01-03 DIAGNOSIS — I10 Essential (primary) hypertension: Secondary | ICD-10-CM | POA: Diagnosis not present

## 2022-01-03 DIAGNOSIS — Z801 Family history of malignant neoplasm of trachea, bronchus and lung: Secondary | ICD-10-CM | POA: Diagnosis not present

## 2022-01-03 DIAGNOSIS — M1712 Unilateral primary osteoarthritis, left knee: Secondary | ICD-10-CM | POA: Diagnosis not present

## 2022-01-03 DIAGNOSIS — Z87891 Personal history of nicotine dependence: Secondary | ICD-10-CM | POA: Diagnosis not present

## 2022-01-03 DIAGNOSIS — F32A Depression, unspecified: Secondary | ICD-10-CM | POA: Diagnosis not present

## 2022-01-03 DIAGNOSIS — I4819 Other persistent atrial fibrillation: Secondary | ICD-10-CM | POA: Diagnosis not present

## 2022-01-03 DIAGNOSIS — Z471 Aftercare following joint replacement surgery: Secondary | ICD-10-CM | POA: Diagnosis not present

## 2022-01-03 DIAGNOSIS — M810 Age-related osteoporosis without current pathological fracture: Secondary | ICD-10-CM | POA: Diagnosis present

## 2022-01-03 DIAGNOSIS — Z7401 Bed confinement status: Secondary | ICD-10-CM | POA: Diagnosis not present

## 2022-01-03 DIAGNOSIS — Z933 Colostomy status: Secondary | ICD-10-CM | POA: Diagnosis not present

## 2022-01-03 DIAGNOSIS — Z79899 Other long term (current) drug therapy: Secondary | ICD-10-CM | POA: Diagnosis not present

## 2022-01-03 DIAGNOSIS — Z833 Family history of diabetes mellitus: Secondary | ICD-10-CM | POA: Diagnosis not present

## 2022-01-03 DIAGNOSIS — Z955 Presence of coronary angioplasty implant and graft: Secondary | ICD-10-CM | POA: Diagnosis not present

## 2022-01-03 DIAGNOSIS — E119 Type 2 diabetes mellitus without complications: Secondary | ICD-10-CM | POA: Diagnosis not present

## 2022-01-03 DIAGNOSIS — D62 Acute posthemorrhagic anemia: Secondary | ICD-10-CM | POA: Diagnosis not present

## 2022-01-03 DIAGNOSIS — M2550 Pain in unspecified joint: Secondary | ICD-10-CM | POA: Diagnosis not present

## 2022-01-03 DIAGNOSIS — Z20822 Contact with and (suspected) exposure to covid-19: Secondary | ICD-10-CM | POA: Diagnosis not present

## 2022-01-03 DIAGNOSIS — I252 Old myocardial infarction: Secondary | ICD-10-CM | POA: Diagnosis not present

## 2022-01-03 DIAGNOSIS — Z8249 Family history of ischemic heart disease and other diseases of the circulatory system: Secondary | ICD-10-CM | POA: Diagnosis not present

## 2022-01-03 DIAGNOSIS — K219 Gastro-esophageal reflux disease without esophagitis: Secondary | ICD-10-CM | POA: Diagnosis not present

## 2022-01-03 DIAGNOSIS — Z853 Personal history of malignant neoplasm of breast: Secondary | ICD-10-CM | POA: Diagnosis not present

## 2022-01-03 DIAGNOSIS — M069 Rheumatoid arthritis, unspecified: Secondary | ICD-10-CM | POA: Diagnosis not present

## 2022-01-03 DIAGNOSIS — E785 Hyperlipidemia, unspecified: Secondary | ICD-10-CM | POA: Diagnosis not present

## 2022-01-03 DIAGNOSIS — I2510 Atherosclerotic heart disease of native coronary artery without angina pectoris: Secondary | ICD-10-CM | POA: Diagnosis not present

## 2022-01-03 DIAGNOSIS — Z7901 Long term (current) use of anticoagulants: Secondary | ICD-10-CM | POA: Diagnosis not present

## 2022-01-03 DIAGNOSIS — F419 Anxiety disorder, unspecified: Secondary | ICD-10-CM | POA: Diagnosis not present

## 2022-01-03 DIAGNOSIS — D1800 Hemangioma unspecified site: Secondary | ICD-10-CM | POA: Diagnosis not present

## 2022-01-03 DIAGNOSIS — R7989 Other specified abnormal findings of blood chemistry: Secondary | ICD-10-CM | POA: Diagnosis not present

## 2022-01-03 DIAGNOSIS — G8929 Other chronic pain: Secondary | ICD-10-CM | POA: Diagnosis not present

## 2022-01-03 LAB — CBC
HCT: 31 % — ABNORMAL LOW (ref 36.0–46.0)
Hemoglobin: 10.1 g/dL — ABNORMAL LOW (ref 12.0–15.0)
MCH: 29.3 pg (ref 26.0–34.0)
MCHC: 32.6 g/dL (ref 30.0–36.0)
MCV: 89.9 fL (ref 80.0–100.0)
Platelets: 158 10*3/uL (ref 150–400)
RBC: 3.45 MIL/uL — ABNORMAL LOW (ref 3.87–5.11)
RDW: 14.4 % (ref 11.5–15.5)
WBC: 8.2 10*3/uL (ref 4.0–10.5)
nRBC: 0 % (ref 0.0–0.2)

## 2022-01-03 LAB — PROTIME-INR
INR: 1.5 — ABNORMAL HIGH (ref 0.8–1.2)
Prothrombin Time: 17.7 s — ABNORMAL HIGH (ref 11.4–15.2)

## 2022-01-03 MED ORDER — WARFARIN SODIUM 5 MG PO TABS
7.5000 mg | ORAL_TABLET | Freq: Once | ORAL | Status: AC
  Administered 2022-01-03: 17:00:00 7.5 mg via ORAL
  Filled 2022-01-03: qty 1

## 2022-01-03 NOTE — TOC Progression Note (Signed)
Transition of Care [facility]) - Progression Note   Patient Details  Name: Melody Casey MRN: [id number] Date of Birth: Apr 18, 1946  Transition of Care [facility]) CM/SW Jette, LCSW Phone Number: 01/03/2022, 2:25 PM  Clinical Narrative: Patient will now be going to SNF to due limited progress. FL2 done; PASRR is pending as patient will be screened for level 2. Clinicals cannot be uploaded for review until the FL2 and 30 day note are cosigned. CSW updated ortho RNCM. Initial referral faxed out. TOC to follow.  Expected Discharge Plan: [place] Barriers to Discharge: SNF Pending bed offer, Awaiting State Approval (PASRR)  Expected Discharge Plan and Services Expected Discharge Plan: [place] In-house Referral: Clinical Social Work Post Acute Care Choice: [place] Expected Discharge Date: 01/01/22               DME Arranged: N/A DME Agency: NA HH Arranged: PT HH Agency: [facility] Representative spoke with at [facility] in orthopedist's office  Readmission Risk Interventions No flowsheet data found.

## 2022-01-03 NOTE — Care Plan (Signed)
Attempted to reach patient, patient's daughter and husband to discuss disposition. No answer on any phone. Patient doesn't qualify for CIR with this surgery. If she continues slow progression will need to look at SNF placement.  She is set up with HHPT from [facility] and has equipment at home per her report.   Will continue to reach out to patient and family.   MD/PA updated on situation and CSW contacted    Ladell Heads, [place]

## 2022-01-03 NOTE — Progress Notes (Signed)
Inpatient Rehabilitation Admissions Coordinator   Contacted by Levora Dredge with Dr Damita Dunnings office. Daughter requested patient be considered for CIR admit as she was previously at [facility] in 2019. Patient currently lacks the medical neccesity for a CIR admit with her left total knee surgery. SNF is recommended. We will not pursue CIR admit. I have alerted acute team and TOC.  Danne Baxter, RN, MSN Rehab Admissions Coordinator [phone] 01/03/2022 1:16 PM

## 2022-01-03 NOTE — NC FL2 (Signed)
[place] LEVEL OF CARE SCREENING TOOL     IDENTIFICATION  Patient Name: Melody Casey Birthdate: 1946-07-10 Sex: female Admission Date (Current Location): 01/01/2022  [facility] and [place] Number:  Herbalist and Address:  [facility],  [place] [address], [place]      Provider Number: [id number]  Attending Physician Name and Address:  Frederik Pear, MD  Relative Name and Phone Number:  Geoffrey Mankin (husband) Ph: [phone]    Current Level of Care: Hospital Recommended Level of Care: [place] Prior Approval Number:    Date Approved/Denied:   PASRR Number: Pending  Discharge Plan: Home    Current Diagnoses: Patient Active Problem List   Diagnosis Date Noted   S/P total knee arthroplasty, left 01/01/2022   Degenerative arthritis of left knee 12/28/2021   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/17/2021   Herpes zoster without complication 61/44/3154   Edema of left lower extremity    Osteoporosis 08/27/2017   Low vitamin D level 06/04/2017   Microcytic anemia 08/30/2015   Hemangioma    History of TIA (transient ischemic attack) 08/13/2015   Rheumatoid arthritis ([place]) 06/20/2015   AAA (abdominal aortic aneurysm) without rupture ([place]) 01/28/2015   Persistent atrial fibrillation ([place]) 11/17/2014   History of scleroderma 02/17/2014   Malignant neoplasm of upper-outer quadrant of right breast in female, estrogen receptor positive ([place]) 02/01/2014   Anxiety and depression 12/07/2011   High risk medication use 06/14/2011   CAD S/P percutaneous coronary angioplasty    Type 2 diabetes mellitus with complication, without long-term current use of insulin ([place]) 06/07/2008   Dyslipidemia, goal LDL below 70 07/22/2007   Essential hypertension 07/22/2007    Orientation RESPIRATION BLADDER Height & Weight     Self, Time, Situation, Place  Normal Incontinent Weight: 188 lb 4.4 oz (85.4 kg) Height:  5\' 7"  (170.2 cm)  BEHAVIORAL  SYMPTOMS/MOOD NEUROLOGICAL BOWEL NUTRITION STATUS   (N/A)  (N/A) Continent Diet (Regular diet)  AMBULATORY STATUS COMMUNICATION OF NEEDS Skin   Total Care Verbally Surgical wounds, Other (Comment) (Ecchymosis: left leg)                       Personal Care Assistance Level of Assistance  Bathing, Feeding, Dressing Bathing Assistance: Maximum assistance Feeding assistance: Limited assistance Dressing Assistance: Maximum assistance     Functional Limitations Info  Sight, Hearing, Speech Sight Info: Adequate Hearing Info: Adequate Speech Info: Adequate    SPECIAL CARE FACTORS FREQUENCY  PT (By licensed PT), OT (By licensed OT)     PT Frequency: 5x's/week OT Frequency: 5x's/week            Contractures Contractures Info: Not present    Additional Factors Info  Code Status, Allergies, Psychotropic Code Status Info: Full Allergies Info: Miralax (Polyethylene Glycol), Vancomycin, Acetaminophen, Banana, Oxycodone-acetaminophen, Sulfa Antibiotics, Sulfacetamide Sodium, Sulfasalazine, Latex, Tape Psychotropic Info: Xanax         Current Medications (01/03/2022):  This is the current hospital active medication list Current Facility-Administered Medications  Medication Dose Route Frequency Provider Last Rate Last Admin   ALPRAZolam Duanne Moron) tablet 0.25 mg  0.25 mg Oral TID PRN Leighton Parody, PA-C   0.25 mg at 01/02/22 2116   alum & mag hydroxide-simeth (MAALOX/MYLANTA) 200-200-20 MG/5ML suspension 30 mL  30 mL Oral Q4H PRN Leighton Parody, PA-C       amLODipine (NORVASC) tablet 5 mg  5 mg Oral Daily Leighton Parody, PA-C  5 mg at 01/03/22 1014   atorvastatin (LIPITOR) tablet 60 mg  60 mg Oral QPM Leighton Parody, PA-C   60 mg at 01/02/22 1753   bisacodyl (DULCOLAX) EC tablet 5 mg  5 mg Oral Daily PRN Leighton Parody, PA-C       dextrose 5 % and 0.45 % NaCl with KCl 20 mEq/L infusion   Intravenous Continuous Leighton Parody, PA-C 125 mL/hr at 01/02/22 8003 Infusion  Verify at 01/02/22 4917   diphenhydrAMINE (BENADRYL) 12.5 MG/5ML elixir 12.5-25 mg  12.5-25 mg Oral Q4H PRN Leighton Parody, PA-C       docusate sodium (COLACE) capsule 100 mg  100 mg Oral BID Leighton Parody, PA-C   100 mg at 01/03/22 1014   dofetilide (TIKOSYN) capsule 250 mcg  250 mcg Oral BID Leighton Parody, PA-C   250 mcg at 01/03/22 1013   enoxaparin (LOVENOX) injection 40 mg  40 mg Subcutaneous Q24H Leighton Parody, PA-C   40 mg at 01/03/22 9150   HYDROmorphone (DILAUDID) tablet 2 mg  2 mg Oral Q4H PRN Leighton Parody, PA-C   2 mg at 01/02/22 2116   hydroxychloroquine (PLAQUENIL) tablet 200 mg  200 mg Oral BID Leighton Parody, PA-C   200 mg at 01/03/22 1014   lactated ringers bolus 250 mL  250 mL Intravenous Once Joanell Rising K, PA-C       lisinopril (ZESTRIL) tablet 20 mg  20 mg Oral Daily Leighton Parody, PA-C   20 mg at 01/03/22 1014   magnesium oxide (MAG-OX) tablet 400 mg  400 mg Oral BID Leighton Parody, PA-C   400 mg at 01/03/22 1015   meclizine (ANTIVERT) tablet 25 mg  25 mg Oral TID PRN Leighton Parody, PA-C       menthol-cetylpyridinium (CEPACOL) lozenge 3 mg  1 lozenge Oral PRN Leighton Parody, PA-C       Or   phenol (CHLORASEPTIC) mouth spray 1 spray  1 spray Mouth/Throat PRN Leighton Parody, PA-C       metoCLOPramide (REGLAN) tablet 5-10 mg  5-10 mg Oral Q8H PRN Leighton Parody, PA-C       Or   metoCLOPramide (REGLAN) injection 5-10 mg  5-10 mg Intravenous Q8H PRN Leighton Parody, PA-C       metoprolol succinate (TOPROL-XL) 24 hr tablet 25 mg  25 mg Oral QHS Leighton Parody, PA-C   25 mg at 01/02/22 2116   ondansetron (ZOFRAN) tablet 4 mg  4 mg Oral Q6H PRN Leighton Parody, PA-C       Or   ondansetron [facility]) injection 4 mg  4 mg Intravenous Q6H PRN Joanell Rising K, PA-C       pantoprazole (PROTONIX) EC tablet 40 mg  40 mg Oral Daily Leighton Parody, PA-C   40 mg at 01/03/22 0843   polyethylene glycol (MIRALAX / GLYCOLAX) packet 17 g  17 g Oral Daily PRN  Leighton Parody, PA-C       potassium chloride (KLOR-CON M) CR tablet 10 mEq  10 mEq Oral Daily Leighton Parody, PA-C   10 mEq at 01/03/22 1014   sodium phosphate (FLEET) 7-19 GM/118ML enema 1 enema  1 enema Rectal Once PRN Leighton Parody, PA-C       traMADol Veatrice Bourbon) tablet 50 mg  50 mg Oral Q6H PRN Joanell Rising K, PA-C   50 mg at 01/03/22 1359   warfarin (COUMADIN) tablet 7.5  mg  7.5 mg Oral ONCE-1600 Shade, Haze Justin, RPH       Warfarin - Pharmacist Dosing Inpatient   Does not apply q1600 Eudelia Bunch, [facility]         Discharge Medications: Please see discharge summary for a list of discharge medications.  Relevant Imaging Results:  Relevant Lab Results:   Additional Information SSN: [id number]  Sherie Don, LCSW

## 2022-01-03 NOTE — Care Plan (Signed)
Spoke with patient's daughter regarding discharge planning. Explained that CIR is not an option at this time. Discussed options for placement.  At baseline patient is very mobile, does gardening and such. She is independent with all ADL's and cares for husband.  Explained process for placement.  She will speak with patient. CSW updated on conversation.   Ladell Heads, [place]

## 2022-01-03 NOTE — Progress Notes (Addendum)
PATIENT ID: Melody Casey  MRN: [id number]  DOB/AGE:  Jul 27, 1946 / 76 y.o.  2 Days Post-Op Procedure(s) (LRB): LEFT TOTAL KNEE ARTHROPLASTY (Left)    PROGRESS NOTE Subjective: Patient is alert, oriented, no Nausea, no Vomiting, yes passing gas. Taking PO well. Denies SOB, Chest or Calf Pain. Using Incentive Spirometer, PAS in place. Ambulate WBAT with pt working on transfers, Patient reports pain as moderate to severe.  Per the nursing staff pt was hearing sounds(her Dog barking) over night.    Objective: Vital signs in last 24 hours: Vitals:   01/02/22 0605 01/02/22 1118 01/02/22 2133 01/03/22 0629  BP: (!) 165/67 (!) 159/63 (!) 157/63 (!) 144/69  Pulse: 82 70 85 74  Resp: 16 16 17 16   Temp: 97.8 F (36.6 C) 98.5 F (36.9 C) 98 F (36.7 C) 98.7 F (37.1 C)  TempSrc: Oral  Oral Oral  SpO2: 96% 95% 96% 94%  Weight:      Height:          Intake/Output from previous day: I/O last 3 completed shifts: In: 1500.7 [P.O.:240; I.V.:1260.7] Out: 900 [Urine:900]   Intake/Output this shift: No intake/output data recorded.   LABORATORY DATA: Recent Labs    01/01/22 1039 01/01/22 1429 01/01/22 1855 01/01/22 1855 01/02/22 0336 01/03/22 0328  WBC  --   --  6.7   < > 7.4 8.2  HGB  --   --  11.5*   < > 10.8* 10.1*  HCT  --   --  35.0*   < > 32.3* 31.0*  PLT  --   --  141*   < > 142* 158  NA  --   --   --   --  133*  --   K  --   --   --   --  4.2  --   CL  --   --   --   --  105  --   CO2  --   --   --   --  23  --   BUN  --   --   --   --  13  --   CREATININE  --   --  0.60  --  0.57  --   GLUCOSE  --   --   --   --  216*  --   GLUCAP 152* 152*  --   --   --   --   INR  --   --   --   --  1.2 1.5*  CALCIUM  --   --   --   --  9.5  --    < > = values in this interval not displayed.    Examination: Neurologically intact Neurovascular intact Sensation intact distally Intact pulses distally Dorsiflexion/Plantar flexion intact Incision: dressing C/D/I No cellulitis  present Compartment soft}  Assessment:   2 Days Post-Op Procedure(s) (LRB): LEFT TOTAL KNEE ARTHROPLASTY (Left) ADDITIONAL DIAGNOSIS: Expected Acute Blood Loss Anemia, Hypertension and Cardiac Arrythmia afib , hs scleroderma RLE.  Anticipated LOS equal to or greater than 2 midnights due to - Age 5 and older with one or more of the following:  - Obesity  - Expected need for hospital services (PT, OT, Nursing) required for safe  discharge  - Anticipated need for postoperative skilled nursing care or inpatient rehab  - Active co-morbidities: Cardiac Arrhythmia OR   - Unanticipated findings during/Post Surgery: Slow post-op progression: GI, pain control, mobility  Plan: PT/OT WBAT with knee immobilizer, AROM and PROM Knee immobilizer may be helpful when not working on ROM DVT Prophylaxis:  SCDx72hrs, lovenox to coumadin bridge DISCHARGE PLAN: Home, when pt passes therapy DISCHARGE NEEDS: HHPT, Walker, and 3-in-1 comode seat     Joanell Rising 01/03/2022, 9:24 AM

## 2022-01-03 NOTE — Progress Notes (Signed)
ANTICOAGULATION CONSULT NOTE - Follow Up Consult  Pharmacy Consult for Warfarin Indication: atrial fibrillation  Allergies  Allergen Reactions   Miralax [Polyethylene Glycol] Swelling and Rash    Took [facility] brand developed rash. Patient states she tolerated name brand MiraLax in the past.  09/01/15. Patient states she had diffuse swelling including swelling of her lips.    Vancomycin Rash and Shortness Of Breath   Acetaminophen Hives   Banana Other (See Comments)    Upset stomach   Oxycodone-Acetaminophen Itching   Sulfa Antibiotics Rash    All over rash   Sulfacetamide Sodium Rash    All over rash   Sulfasalazine Rash    All over rash    Latex Rash   Tape Rash    Patient Measurements: Height: 5\' 7"  (170.2 cm) Weight: 85.4 kg (188 lb 4.4 oz) IBW/kg (Calculated) : 61.6  Vital Signs: Temp: 98.7 F (37.1 C) (01/25 0629) Temp Source: Oral (01/25 0629) BP: 144/69 (01/25 0629) Pulse Rate: 74 (01/25 0629)  Labs: Recent Labs    01/01/22 1855 01/02/22 0336 01/03/22 0328  HGB 11.5* 10.8* 10.1*  HCT 35.0* 32.3* 31.0*  PLT 141* 142* 158  LABPROT  --  14.8 17.7*  INR  --  1.2 1.5*  CREATININE 0.60 0.57  --      Estimated Creatinine Clearance: 66.1 mL/min (by C-G formula based on SCr of 0.57 mg/dL).   Medications:  Scheduled:   amLODipine  5 mg Oral Daily   atorvastatin  60 mg Oral QPM   docusate sodium  100 mg Oral BID   dofetilide  250 mcg Oral BID   enoxaparin (LOVENOX) injection  40 mg Subcutaneous Q24H   hydroxychloroquine  200 mg Oral BID   lisinopril  20 mg Oral Daily   magnesium oxide  400 mg Oral BID   metoprolol succinate  25 mg Oral QHS   pantoprazole  40 mg Oral Daily   potassium chloride  10 mEq Oral Daily   Warfarin - Pharmacist Dosing Inpatient   Does not apply q1600     Assessment: 76 yo F on warfarin PTA for Afib.  Warfarin managed by Dr Dettinger at [facility].  Last INR 12/21 was therapeutic on dose of 7.5 Mon/Thursday, 5 mg all  other days. She was instructed to hold warfarin 5 days preop. S/p L TKR on 01/01/22.  Today, 01/03/2022: INR 1.5, remains subtherapeutic, but increasing CBC:  Hgb deceased to 10.1 (no bleeding reported, suspect postop ABLA); Plt improved/WNL.  Drug-drug interactions:  TXA 2g topical intra-op on 1/23, TXA 1g IV postop not given.    Goal of Therapy:  INR 2-3 Monitor platelets by anticoagulation protocol: Yes   Plan:  Warfarin 7.5 mg PO today at 16:00 Daily INR LMWH 40 qday until INR > 2    Gretta Arab PharmD, BCPS Clinical Pharmacist [facility] main pharmacy [phone] 01/03/2022 11:25 AM

## 2022-01-03 NOTE — Progress Notes (Addendum)
RE: Kyli Sorter Date of Birth: November 15, 1946 Date: 01/03/2022 MUST ID: [id number]    To Whom It May Concern:   Please be advised that the above name patient will require a short-term nursing home stay--anticipated 30 days or less rehabilitation and strengthening. The plan is for return home.

## 2022-01-03 NOTE — Progress Notes (Addendum)
Physical Therapy Treatment Patient Details Name: Melody Casey MRN: [id number] DOB: 22-Apr-1946 Today's Date: 01/03/2022   History of Present Illness Pt is a 76 year old female s/p Left TKA on 01/01/22.  PMHx: HTN, DM, breast cancer, CAD, afib, colostomy    PT Comments    POD # 2 pm session Pt back to bed with nursing + 2 Max Assist.  NT reported it was "very difficult because she couldn't turn" D/C plans are now for SNF. Performed bed level TKR TE's but was limited by PAIN.  Instructed on use of belt to assist with TE's but pain was a barrier.  Pt was pre medicated with 500 mg Tramadol but still reports 9/10.  RN reported the Dilaudid was altering/impairing pt cognition.  Pt required AAROM TE's and present with limited knee flexion no more than 15 degrees.  Unable to complete all TE's and applied ICE. Pt also c/o "sore throat".  She asked me to take a flash light to look.  Back of throat is red.  Reported to Amy RN.    Recommendations for follow up therapy are one component of a multi-disciplinary discharge planning process, led by the attending physician.  Recommendations may be updated based on patient status, additional functional criteria and insurance authorization.  Follow Up Recommendations  Skilled nursing-short term rehab (<3 hours/day)     Assistance Recommended at Discharge Frequent or constant Supervision/Assistance  Patient can return home with the following Help with stairs or ramp for entrance;Two people to help with walking and/or transfers;Two people to help with bathing/dressing/bathroom   Equipment Recommendations  None recommended by PT    Recommendations for Other Services       Precautions / Restrictions Precautions Precautions: None;Knee Precaution Comments: colostomy, hx right foot deformity Required Braces or Orthoses: Knee Immobilizer - Left Knee Immobilizer - Left: On when out of bed or walking Restrictions Weight Bearing Restrictions: No Other  Position/Activity Restrictions: WBAT        Balance                                            Cognition Arousal/Alertness: Awake/alert Behavior During Therapy: WFL for tasks assessed/performed Overall Cognitive Status: Within Functional Limits for tasks assessed                                 General Comments: AxO x 2 following all directions but a little distracted and intermittent confused with recall/memory.        Exercises  Total Knee Replacement TE's following HEP handout 10 reps B LE ankle pumps 05 reps towel squeezes 05 reps knee presses 05 reps heel slides  05 reps SAQ's 05 reps SLR's 05 reps ABD Educated on use of gait belt to assist with TE's Followed by ICE     General Comments        Pertinent Vitals/Pain Pain Assessment Pain Assessment: Faces Faces Pain Scale: Hurts even more Pain Location: left knee Pain Descriptors / Indicators: Sore, Aching, Tightness, Tender, Stabbing Pain Intervention(s): Monitored during session, Premedicated before session, Repositioned, Ice applied    Home Living                          Prior Function  PT Goals (current goals can now be found in the care plan section) Progress towards PT goals: Progressing toward goals    Frequency    7X/week      PT Plan Current plan remains appropriate    Co-evaluation              AM-PAC PT "6 Clicks" Mobility   Outcome Measure  Help needed turning from your back to your side while in a flat bed without using bedrails?: Total Help needed moving from lying on your back to sitting on the side of a flat bed without using bedrails?: Total Help needed moving to and from a bed to a chair (including a wheelchair)?: Total Help needed standing up from a chair using your arms (e.g., wheelchair or bedside chair)?: Total Help needed to walk in hospital room?: Total Help needed climbing 3-5 steps with a railing? : Total 6  Click Score: 6    End of Session Equipment Utilized During Treatment: Gait belt;Left knee immobilizer Activity Tolerance: Patient limited by fatigue;Patient limited by pain Patient left: in bed Nurse Communication: Mobility status (RN in room during transfer) PT Visit Diagnosis: Other abnormalities of gait and mobility (R26.89)     Time: 9381-0175 PT Time Calculation (min) (ACUTE ONLY): 13 min  Charges:  $Therapeutic Exercise: 8-22 mins $Therapeutic Activity: 23-37 mins                     Rica Koyanagi  PTA Acute  Rehabilitation Services Pager      [phone] Office      [phone]

## 2022-01-03 NOTE — Progress Notes (Signed)
Physical Therapy Treatment Patient Details Name: Melody Casey MRN: [id number] DOB: [birth year] Today's Date: 01/03/2022   History of Present Illness Pt is a 76 year old female s/p Left TKA on 01/01/22.  PMHx: HTN, DM, breast cancer, CAD, afib, colostomy    PT Comments    POD # 2 Pt NOT progressing with her mobility.  General bed mobility comments: Applied KI in supine as PA suggessted we try wearing for mobility.  Pt unable to perform a SLR so required increased assist to apply. Knee ext lacking approx 15 degrees.  Increased pain with application.General transfer comment: Pt was unable to rise with just + 1 assist.  L LE extended too far front due to KI ans R LE too weak to support her.  Even from an elevated bed surface pt was unable to rise.  Called RN to room to assist.  Pt required + 2 side by side Total Assist to rise.  Once upright, pt was able to static stand at [facility] but present with poor forward flex posture and increased c/o L knee pain 9/10.  Pt was premedicated with Tramodol.  Attempted forward amb however pt was unable to weight shift and support herself enough to advance either LE.  B UE's weak.  R LE buckling under her weight.  Recliner brought to pt from behind.  Uncontrolled desend.  Then pt required + 2 side by side assist to scoot to back of recliner as she was sitting too close to edge.  Pt plans to D/C back home but her Spouse is unable to physically assist.  Will update LPT pt's progress but realistically pt will need ST Rehab @ SNF.   Recommendations for follow up therapy are one component of a multi-disciplinary discharge planning process, led by the attending physician.  Recommendations may be updated based on patient status, additional functional criteria and insurance authorization.  Follow Up Recommendations  Skilled nursing-short term rehab (<3 hours/day)     Assistance Recommended at Discharge Frequent or constant Supervision/Assistance  Patient can return home  with the following Help with stairs or ramp for entrance;Two people to help with walking and/or transfers;Two people to help with bathing/dressing/bathroom   Equipment Recommendations  None recommended by PT    Recommendations for Other Services       Precautions / Restrictions Precautions Precautions: None;Knee Precaution Comments: colostomy, hx right foot deformity Required Braces or Orthoses: Knee Immobilizer - Left Knee Immobilizer - Left: On when out of bed or walking Restrictions Weight Bearing Restrictions: No Other Position/Activity Restrictions: WBAT     Mobility  Bed Mobility Overal bed mobility: Needs Assistance Bed Mobility: Supine to Sit     Supine to sit: Max assist, Total assist     General bed mobility comments: Applied KI in supine as PA suggessted we try wearing for mobility.  Pt unable to perform a SLR so required increased assist to apply. Knee ext lacking approx 15 degrees.  Increased pain with application.    Transfers Overall transfer level: Needs assistance Equipment used: Rolling walker (2 wheels) Transfers: Sit to/from Stand Sit to Stand: Max assist, Total assist, +2 physical assistance, +2 safety/equipment           General transfer comment: Pt was unable to rise with just + 1 assist.  L LE extended too far front due to KI ans R LE too weak to support her.  Even from an elevated bed surface pt was unable to rise.  Called RN to room  to assist.  Pt required + 2 side by side Total Assist to rise.  Once upright, pt was able to static stand at [facility] but present with poor forward flex posture and increased c/o L knee pain 9/10.  Pt was premedicated with Tramodol.  Attempted forward amb however pt was unable to weight shift and support herself enough to advance either LE.  B UE's weak.  R LE buckling under her weight.  Recliner brought to pt from behind.  Uncontrolled desend.  Then pt required + 2 side by side assist to scoot to back of recliner as  she was sitting too close to edge.    Ambulation/Gait               General Gait Details: pt unable even with KI applied   Stairs             Wheelchair Mobility    Modified Rankin (Stroke Patients Only)       Balance                                            Cognition Arousal/Alertness: Awake/alert Behavior During Therapy: WFL for tasks assessed/performed Overall Cognitive Status: Within Functional Limits for tasks assessed                                 General Comments: AxO x 2 following all directions but a little distracted and intermittent confused with recall/memory.        Exercises      General Comments        Pertinent Vitals/Pain Pain Assessment Pain Assessment: Faces Faces Pain Scale: Hurts even more Pain Location: left knee Pain Descriptors / Indicators: Sore, Aching, Tightness, Tender, Stabbing Pain Intervention(s): Monitored during session, Premedicated before session, Repositioned, Ice applied    Home Living                          Prior Function            PT Goals (current goals can now be found in the care plan section) Progress towards PT goals: Progressing toward goals    Frequency    7X/week      PT Plan Current plan remains appropriate    Co-evaluation              AM-PAC PT "6 Clicks" Mobility   Outcome Measure  Help needed turning from your back to your side while in a flat bed without using bedrails?: Total Help needed moving from lying on your back to sitting on the side of a flat bed without using bedrails?: Total Help needed moving to and from a bed to a chair (including a wheelchair)?: Total Help needed standing up from a chair using your arms (e.g., wheelchair or bedside chair)?: Total Help needed to walk in hospital room?: Total Help needed climbing 3-5 steps with a railing? : Total 6 Click Score: 6    End of Session Equipment Utilized During  Treatment: Gait belt;Left knee immobilizer Activity Tolerance: Patient limited by fatigue;Patient limited by pain Patient left: in chair;with chair alarm set;with call bell/phone within reach Nurse Communication: Mobility status (RN in room during transfer) PT Visit Diagnosis: Other abnormalities of gait and mobility (R26.89)     Time:  0601-5615 PT Time Calculation (min) (ACUTE ONLY): 28 min  Charges:  $Therapeutic Activity: 23-37 mins                     {Almarosa Bohac  PTA Acute  Rehabilitation Services Pager      [phone] Office      [phone]

## 2022-01-04 DIAGNOSIS — L853 Xerosis cutis: Secondary | ICD-10-CM | POA: Diagnosis not present

## 2022-01-04 DIAGNOSIS — I10 Essential (primary) hypertension: Secondary | ICD-10-CM | POA: Diagnosis not present

## 2022-01-04 DIAGNOSIS — E871 Hypo-osmolality and hyponatremia: Secondary | ICD-10-CM | POA: Diagnosis not present

## 2022-01-04 DIAGNOSIS — Z7901 Long term (current) use of anticoagulants: Secondary | ICD-10-CM | POA: Diagnosis not present

## 2022-01-04 DIAGNOSIS — K219 Gastro-esophageal reflux disease without esophagitis: Secondary | ICD-10-CM | POA: Diagnosis not present

## 2022-01-04 DIAGNOSIS — M1712 Unilateral primary osteoarthritis, left knee: Secondary | ICD-10-CM | POA: Diagnosis not present

## 2022-01-04 DIAGNOSIS — D1800 Hemangioma unspecified site: Secondary | ICD-10-CM | POA: Diagnosis not present

## 2022-01-04 DIAGNOSIS — R739 Hyperglycemia, unspecified: Secondary | ICD-10-CM | POA: Diagnosis not present

## 2022-01-04 DIAGNOSIS — Z96652 Presence of left artificial knee joint: Secondary | ICD-10-CM | POA: Diagnosis not present

## 2022-01-04 DIAGNOSIS — E860 Dehydration: Secondary | ICD-10-CM | POA: Diagnosis not present

## 2022-01-04 DIAGNOSIS — E8809 Other disorders of plasma-protein metabolism, not elsewhere classified: Secondary | ICD-10-CM | POA: Diagnosis not present

## 2022-01-04 DIAGNOSIS — R070 Pain in throat: Secondary | ICD-10-CM | POA: Diagnosis not present

## 2022-01-04 DIAGNOSIS — E785 Hyperlipidemia, unspecified: Secondary | ICD-10-CM | POA: Diagnosis not present

## 2022-01-04 DIAGNOSIS — Z471 Aftercare following joint replacement surgery: Secondary | ICD-10-CM | POA: Diagnosis not present

## 2022-01-04 DIAGNOSIS — M79604 Pain in right leg: Secondary | ICD-10-CM | POA: Diagnosis not present

## 2022-01-04 DIAGNOSIS — Z7401 Bed confinement status: Secondary | ICD-10-CM | POA: Diagnosis not present

## 2022-01-04 DIAGNOSIS — E119 Type 2 diabetes mellitus without complications: Secondary | ICD-10-CM | POA: Diagnosis not present

## 2022-01-04 DIAGNOSIS — M79605 Pain in left leg: Secondary | ICD-10-CM | POA: Diagnosis not present

## 2022-01-04 DIAGNOSIS — M6281 Muscle weakness (generalized): Secondary | ICD-10-CM | POA: Diagnosis not present

## 2022-01-04 DIAGNOSIS — Z933 Colostomy status: Secondary | ICD-10-CM | POA: Diagnosis not present

## 2022-01-04 DIAGNOSIS — Z9889 Other specified postprocedural states: Secondary | ICD-10-CM | POA: Diagnosis not present

## 2022-01-04 DIAGNOSIS — F419 Anxiety disorder, unspecified: Secondary | ICD-10-CM | POA: Diagnosis not present

## 2022-01-04 DIAGNOSIS — I480 Paroxysmal atrial fibrillation: Secondary | ICD-10-CM | POA: Diagnosis not present

## 2022-01-04 DIAGNOSIS — I4891 Unspecified atrial fibrillation: Secondary | ICD-10-CM | POA: Diagnosis not present

## 2022-01-04 DIAGNOSIS — M2550 Pain in unspecified joint: Secondary | ICD-10-CM | POA: Diagnosis not present

## 2022-01-04 DIAGNOSIS — R791 Abnormal coagulation profile: Secondary | ICD-10-CM | POA: Diagnosis not present

## 2022-01-04 DIAGNOSIS — D649 Anemia, unspecified: Secondary | ICD-10-CM | POA: Diagnosis not present

## 2022-01-04 DIAGNOSIS — M810 Age-related osteoporosis without current pathological fracture: Secondary | ICD-10-CM | POA: Diagnosis not present

## 2022-01-04 DIAGNOSIS — R2689 Other abnormalities of gait and mobility: Secondary | ICD-10-CM | POA: Diagnosis not present

## 2022-01-04 DIAGNOSIS — F32A Depression, unspecified: Secondary | ICD-10-CM | POA: Diagnosis not present

## 2022-01-04 DIAGNOSIS — R2242 Localized swelling, mass and lump, left lower limb: Secondary | ICD-10-CM | POA: Diagnosis not present

## 2022-01-04 DIAGNOSIS — I4819 Other persistent atrial fibrillation: Secondary | ICD-10-CM | POA: Diagnosis not present

## 2022-01-04 DIAGNOSIS — R7989 Other specified abnormal findings of blood chemistry: Secondary | ICD-10-CM | POA: Diagnosis not present

## 2022-01-04 LAB — PROTIME-INR
INR: 1.8 — ABNORMAL HIGH (ref 0.8–1.2)
Prothrombin Time: 21 seconds — ABNORMAL HIGH (ref 11.4–15.2)

## 2022-01-04 LAB — CBC
HCT: 28.5 % — ABNORMAL LOW (ref 36.0–46.0)
Hemoglobin: 9.3 g/dL — ABNORMAL LOW (ref 12.0–15.0)
MCH: 29.1 pg (ref 26.0–34.0)
MCHC: 32.6 g/dL (ref 30.0–36.0)
MCV: 89.1 fL (ref 80.0–100.0)
Platelets: 150 10*3/uL (ref 150–400)
RBC: 3.2 MIL/uL — ABNORMAL LOW (ref 3.87–5.11)
RDW: 14.4 % (ref 11.5–15.5)
WBC: 7.2 10*3/uL (ref 4.0–10.5)
nRBC: 0 % (ref 0.0–0.2)

## 2022-01-04 LAB — RESP PANEL BY RT-PCR (FLU A&B, COVID) ARPGX2
Influenza A by PCR: NEGATIVE
Influenza B by PCR: NEGATIVE
SARS Coronavirus 2 by RT PCR: NEGATIVE

## 2022-01-04 MED ORDER — WARFARIN SODIUM 5 MG PO TABS
7.5000 mg | ORAL_TABLET | Freq: Once | ORAL | Status: AC
  Administered 2022-01-04: 7.5 mg via ORAL
  Filled 2022-01-04: qty 1

## 2022-01-04 MED ORDER — TIZANIDINE HCL 2 MG PO TABS: 2.0000 mg | ORAL_TABLET | Freq: Four times a day (QID) | ORAL | 0 refills | Status: DC | PRN

## 2022-01-04 MED ORDER — HYDROMORPHONE HCL 2 MG PO TABS: 2.0000 mg | ORAL_TABLET | ORAL | 0 refills | Status: DC | PRN

## 2022-01-04 MED ORDER — ENOXAPARIN SODIUM 40 MG/0.4ML IJ SOSY: 40.0000 mg | PREFILLED_SYRINGE | INTRAMUSCULAR | 0 refills | Status: DC

## 2022-01-04 NOTE — TOC Transition Note (Signed)
Transition of Care [facility]) - CM/SW Discharge Note  Patient Details  Name: Melody Casey MRN: [id number] Date of Birth: 04-13-1946  [facility]) CM/SW Contact:  Sherie Don, LCSW Phone Number: 01/04/2022, 12:30 PM  Clinical Narrative: Clinicals were uploaded to [place] MUST and PASRR was received: [id number] E. CSW confirmed with Claiborne Billings in admissions at [facility] that the patient can be admitted to a semiprivate room today. Patient is agreeable. Discharge summary, discharge orders, and SNF transfer report faxed to facility in hub. COVID test is negative.  Patient will go to room 503A and the number for report is [phone] (ask for [facility]). Medical necessity form done; PTAR scheduled. Discharge packet completed. CSW updated RN. TOC signing off.  Final next level of care: Skilled Nursing Facility Barriers to Discharge: Barriers Resolved  Patient Goals and CMS Choice Patient states their goals for this hospitalization and ongoing recovery are:: Go to short-term rehab CMS Medicare.gov Compare Post Acute Care list provided to:: Patient Choice offered to / list presented to : Patient  Discharge Placement PASRR number recieved: 01/04/22   Patient chooses bed at: [facility] Patient to be transferred to facility by: PTAR Patient and family notified of of transfer: 01/04/22  Discharge Plan and Services In-house Referral: Clinical Social Work Post Acute Care Choice: [place]          DME Arranged: N/A DME Agency: NA HH Arranged: PT HH Agency: Magazine features editor spoke with at [facility] in orthopedist's office  Readmission Risk Interventions No flowsheet data found.

## 2022-01-04 NOTE — Progress Notes (Signed)
ANTICOAGULATION CONSULT NOTE - Follow Up Consult  Pharmacy Consult for Warfarin Indication: atrial fibrillation  Allergies  Allergen Reactions   Miralax [Polyethylene Glycol] Swelling and Rash    Took [facility] brand developed rash. Patient states she tolerated name brand MiraLax in the past.  09/01/15. Patient states she had diffuse swelling including swelling of her lips.    Vancomycin Rash and Shortness Of Breath   Acetaminophen Hives   Banana Other (See Comments)    Upset stomach   Oxycodone-Acetaminophen Itching   Sulfa Antibiotics Rash    All over rash   Sulfacetamide Sodium Rash    All over rash   Sulfasalazine Rash    All over rash    Latex Rash   Tape Rash    Patient Measurements: Height: 5\' 7"  (170.2 cm) Weight: 85.4 kg (188 lb 4.4 oz) IBW/kg (Calculated) : 61.6  Vital Signs: Temp: 98 F (36.7 C) (01/26 0600) Temp Source: Oral (01/26 0600) BP: 155/71 (01/26 0600) Pulse Rate: 81 (01/26 0600)  Labs: Recent Labs    01/01/22 1855 01/02/22 0336 01/03/22 0328 01/04/22 0314  HGB 11.5* 10.8* 10.1* 9.3*  HCT 35.0* 32.3* 31.0* 28.5*  PLT 141* 142* 158 150  LABPROT  --  14.8 17.7* 21.0*  INR  --  1.2 1.5* 1.8*  CREATININE 0.60 0.57  --   --      Estimated Creatinine Clearance: 66.1 mL/min (by C-G formula based on SCr of 0.57 mg/dL).   Medications:  Scheduled:   amLODipine  5 mg Oral Daily   atorvastatin  60 mg Oral QPM   docusate sodium  100 mg Oral BID   dofetilide  250 mcg Oral BID   enoxaparin (LOVENOX) injection  40 mg Subcutaneous Q24H   hydroxychloroquine  200 mg Oral BID   lisinopril  20 mg Oral Daily   magnesium oxide  400 mg Oral BID   metoprolol succinate  25 mg Oral QHS   pantoprazole  40 mg Oral Daily   potassium chloride  10 mEq Oral Daily   Warfarin - Pharmacist Dosing Inpatient   Does not apply q1600     Assessment: 76 yo F on warfarin PTA for Afib.  Warfarin managed by Dr Dettinger at [employer].  Last INR 12/21 was  therapeutic on dose of 7.5 Mon/Thursday, 5 mg all other days. She was instructed to hold warfarin 5 days preop. S/p L TKR on 01/01/22.  Today, 01/04/2022: INR 1.8, remains subtherapeutic, but increasing CBC:  Hgb deceased to 9.3 (no bleeding reported, suspect postop ABLA); Plt improved/WNL.  Drug-drug interactions:  TXA 2g topical intra-op on 1/23, TXA 1g IV postop not given.    Goal of Therapy:  INR 2-3 Monitor platelets by anticoagulation protocol: Yes   Plan:  Repeat warfarin 7.5 mg PO today at 16:00, would assume can resume home dose starting tomorrow as per home regimen Daily INR LMWH 40 qday until INR > 2, would think likely can stop Lovenox tomorrow if INR is checked tomorrow   Adrian Saran, PharmD, BCPS Secure Chat if ?s 01/04/2022 12:51 PM

## 2022-01-04 NOTE — Discharge Summary (Signed)
Patient ID: Melody Casey MRN: [id number] DOB/AGE: [birth year] 76 y.o.  Admit date: 01/01/2022 Discharge date: 01/04/2022  Admission Diagnoses:  Principal Problem:   Degenerative arthritis of left knee Active Problems:   S/P total knee arthroplasty, left   Discharge Diagnoses:  Same  Past Medical History:  Diagnosis Date   AAA (abdominal aortic aneurysm)    Acute diverticulitis    Anxiety    Arthritis    Atrial fibrillation ([place])    a. Dx 03/2011 - on tikosyn/coumadin.   CAD (coronary artery disease)    a. NSTEMI 02/2011: occ mid Cx, DES to OM2, residual nonobst LAD dz.   Cancer [facility])    breast   Diabetes mellitus    Diverticulitis    Diverticulitis of colon     2008, 04/2011, 12/2014, 08/2015   Dysrhythmia    Foot deformity 1947   right; ??scleroderma   Hemangioma    liver   HLD (hyperlipidemia)    HTN (hypertension)    Osteoporosis    Tobacco abuse    stopped smoking 2012   Ureteral obstruction    History of gross hematuria/right hydronephrosis 2/2 to uteropelvic junction obstruction, s/p cystoscopy in January 2007 with bilateral retrograde pyelography, right ureter arthroscopy, right ureteral stent placement, bladder biopsies, stent removal since then.   Wears dentures    top    Surgeries: Procedure(s): LEFT TOTAL KNEE ARTHROPLASTY on 01/01/2022   Consultants:   Discharged Condition: Improved  Hospital Course: Melody Casey is an 76 y.o. female who was admitted 01/01/2022 for operative treatment ofDegenerative arthritis of left knee. Patient has severe unremitting pain that affects sleep, daily activities, and work/hobbies. After pre-op clearance the patient was taken to the operating room on 01/01/2022 and underwent  Procedure(s): LEFT TOTAL KNEE ARTHROPLASTY.    Patient was given perioperative antibiotics:  Anti-infectives (From admission, onward)    Start     Dose/Rate Route Frequency Ordered Stop   01/01/22 2200  hydroxychloroquine (PLAQUENIL) tablet 200 mg         200 mg Oral 2 times daily 01/01/22 1815     01/01/22 1030  ceFAZolin (ANCEF) IVPB 2g/100 mL premix        2 g 200 mL/hr over 30 Minutes Intravenous On call to O.R. 01/01/22 1015 01/01/22 1145        Patient was given sequential compression devices, early ambulation, and chemoprophylaxis to prevent DVT.  Patient benefited maximally from hospital stay and there were no complications.    Recent vital signs: Patient Vitals for the past 24 hrs:  BP Temp Temp src Pulse Resp SpO2  01/04/22 0600 (!) 155/71 98 F (36.7 C) Oral 81 16 95 %  01/03/22 2056 (!) 147/98 98.3 F (36.8 C) Oral 83 16 92 %  01/03/22 1318 (!) 161/62 98.3 F (36.8 C) Oral 70 16 94 %     Recent laboratory studies:  Recent Labs    01/01/22 1855 01/01/22 1855 01/02/22 0336 01/03/22 0328 01/04/22 0314  WBC 6.7  --  7.4 8.2 7.2  HGB 11.5*  --  10.8* 10.1* 9.3*  HCT 35.0*  --  32.3* 31.0* 28.5*  PLT 141*  --  142* 158 150  NA  --   --  133*  --   --   K  --   --  4.2  --   --   CL  --   --  105  --   --   CO2  --   --  23  --   --   BUN  --   --  13  --   --   CREATININE 0.60  --  0.57  --   --   GLUCOSE  --   --  216*  --   --   INR  --    < > 1.2 1.5* 1.8*  CALCIUM  --   --  9.5  --   --    < > = values in this interval not displayed.     Discharge Medications:   Allergies as of 01/04/2022       Reactions   Miralax [polyethylene Glycol] Swelling, Rash   Took [facility] brand developed rash. Patient states she tolerated name brand MiraLax in the past. 09/01/15. Patient states she had diffuse swelling including swelling of her lips.    Vancomycin Rash, Shortness Of Breath   Acetaminophen Hives   Banana Other (See Comments)   Upset stomach   Oxycodone-acetaminophen Itching   Sulfa Antibiotics Rash   All over rash   Sulfacetamide Sodium Rash   All over rash   Sulfasalazine Rash   All over rash   Latex Rash   Tape Rash        Medication List     STOP taking these medications    ibuprofen  200 MG tablet Commonly known as: ADVIL       TAKE these medications    ALPRAZolam 0.25 MG tablet Commonly known as: XANAX Take 1 tablet (0.25 mg total) by mouth 3 (three) times daily as needed for anxiety.   amLODipine 5 MG tablet Commonly known as: NORVASC TAKE 1 TABLET(5 MG) BY MOUTH DAILY   atorvastatin 40 MG tablet Commonly known as: LIPITOR TAKE 1 AND 1/2 TABLETS BY MOUTH EVERY DAY What changed:  when to take this additional instructions   cholecalciferol 25 MCG (1000 UNIT) tablet Commonly known as: VITAMIN D3 Take 1,000 Units by mouth daily.   dofetilide 250 MCG capsule Commonly known as: TIKOSYN Take 1 capsule (250 mcg total) by mouth 2 (two) times daily.   enoxaparin 40 MG/0.4ML injection Commonly known as: LOVENOX Inject 0.4 mLs (40 mg total) into the skin daily.   HYDROmorphone 2 MG tablet Commonly known as: Dilaudid Take 1 tablet (2 mg total) by mouth every 4 (four) hours as needed for severe pain.   hydroxychloroquine 200 MG tablet Commonly known as: PLAQUENIL Take 1 tablet (200 mg total) by mouth 2 (two) times daily.   lisinopril 20 MG tablet Commonly known as: ZESTRIL Take 1 tablet (20 mg total) by mouth daily.   magnesium oxide 400 MG tablet Commonly known as: MAG-OX Take 400 mg by mouth 2 (two) times daily.   meclizine 25 MG tablet Commonly known as: ANTIVERT Take 1 tablet (25 mg total) by mouth 3 (three) times daily as needed for dizziness.   metoprolol succinate 25 MG 24 hr tablet Commonly known as: TOPROL-XL TAKE 1 TABLET(25 MG) BY MOUTH DAILY   potassium chloride 10 MEQ tablet Commonly known as: KLOR-CON M TAKE 1 TABLET BY MOUTH DAILY   tiZANidine 2 MG tablet Commonly known as: ZANAFLEX Take 1 tablet (2 mg total) by mouth every 6 (six) hours as needed.   warfarin 5 MG tablet Commonly known as: COUMADIN Take as directed. If you are unsure how to take this medication, talk to your nurse or doctor. Original instructions: TAKE 1  AND 1/2 TABLETS(7.5 MG) BY MOUTH DAILY What changed:  how much to take  how to take this when to take this additional instructions               Durable Medical Equipment  (From admission, onward)           Start     Ordered   01/01/22 1816  DME Walker rolling  Once       Question:  Patient needs a walker to treat with the following condition  Answer:  Status post total left knee replacement   01/01/22 1815   01/01/22 1816  DME 3 n 1  Once        01/01/22 1815              Discharge Care Instructions  (From admission, onward)           Start     Ordered   01/04/22 0000  Weight bearing as tolerated        01/04/22 0835   01/01/22 0000  Weight bearing as tolerated        01/01/22 1415            Diagnostic Studies: DG Chest 2 View  Result Date: 12/25/2021 CLINICAL DATA:  Preop knee replacement. EXAM: CHEST - 2 VIEW COMPARISON:  07/27/2018 FINDINGS: Prominent lung markings appear to be chronic. No focal airspace disease or overt pulmonary edema. Heart size is normal. Trachea is midline. No pleural effusions. No acute bone abnormality IMPRESSION: Chronic lung changes without acute findings. Electronically Signed   By: Markus Daft M.D.   On: 12/25/2021 12:10    Disposition: Discharge disposition: 03-Skilled Nursing Facility       Discharge Instructions     Call MD / Call 911   Complete by: As directed    If you experience chest pain or shortness of breath, CALL 911 and be transported to the hospital emergency room.  If you develope a fever above 101 F, pus (white drainage) or increased drainage or redness at the wound, or calf pain, call your surgeon's office.   Call MD / Call 911   Complete by: As directed    If you experience chest pain or shortness of breath, CALL 911 and be transported to the hospital emergency room.  If you develope a fever above 101 F, pus (white drainage) or increased drainage or redness at the wound, or calf pain, call your  surgeon's office.   Constipation Prevention   Complete by: As directed    Drink plenty of fluids.  Prune juice may be helpful.  You may use a stool softener, such as Colace (over the counter) 100 mg twice a day.  Use MiraLax (over the counter) for constipation as needed.   Constipation Prevention   Complete by: As directed    Drink plenty of fluids.  Prune juice may be helpful.  You may use a stool softener, such as Colace (over the counter) 100 mg twice a day.  Use MiraLax (over the counter) for constipation as needed.   Diet - low sodium heart healthy   Complete by: As directed    Driving restrictions   Complete by: As directed    No driving for 2 weeks   Driving restrictions   Complete by: As directed    No driving for 2 weeks   Increase activity slowly as tolerated   Complete by: As directed    Knee immobilizer when at rest   Increase activity slowly as tolerated   Complete by: As directed  Patient may shower   Complete by: As directed    You may shower without a dressing once there is no drainage.  Do not wash over the wound.  If drainage remains, cover wound with plastic wrap and then shower.   Patient may shower   Complete by: As directed    You may shower without a dressing once there is no drainage.  Do not wash over the wound.  If drainage remains, cover wound with plastic wrap and then shower.   Post-operative opioid taper instructions:   Complete by: As directed    POST-OPERATIVE OPIOID TAPER INSTRUCTIONS: It is important to wean off of your opioid medication as soon as possible. If you do not need pain medication after your surgery it is ok to stop day one. Opioids include: Codeine, Hydrocodone(Norco, Vicodin), Oxycodone(Percocet, oxycontin) and hydromorphone amongst others.  Long term and even short term use of opiods can cause: Increased pain response Dependence Constipation Depression Respiratory depression And more.  Withdrawal symptoms can include Flu like  symptoms Nausea, vomiting And more Techniques to manage these symptoms Hydrate well Eat regular healthy meals Stay active Use relaxation techniques(deep breathing, meditating, yoga) Do Not substitute Alcohol to help with tapering If you have been on opioids for less than two weeks and do not have pain than it is ok to stop all together.  Plan to wean off of opioids This plan should start within one week post op of your joint replacement. Maintain the same interval or time between taking each dose and first decrease the dose.  Cut the total daily intake of opioids by one tablet each day Next start to increase the time between doses. The last dose that should be eliminated is the evening dose.      Post-operative opioid taper instructions:   Complete by: As directed    POST-OPERATIVE OPIOID TAPER INSTRUCTIONS: It is important to wean off of your opioid medication as soon as possible. If you do not need pain medication after your surgery it is ok to stop day one. Opioids include: Codeine, Hydrocodone(Norco, Vicodin), Oxycodone(Percocet, oxycontin) and hydromorphone amongst others.  Long term and even short term use of opiods can cause: Increased pain response Dependence Constipation Depression Respiratory depression And more.  Withdrawal symptoms can include Flu like symptoms Nausea, vomiting And more Techniques to manage these symptoms Hydrate well Eat regular healthy meals Stay active Use relaxation techniques(deep breathing, meditating, yoga) Do Not substitute Alcohol to help with tapering If you have been on opioids for less than two weeks and do not have pain than it is ok to stop all together.  Plan to wean off of opioids This plan should start within one week post op of your joint replacement. Maintain the same interval or time between taking each dose and first decrease the dose.  Cut the total daily intake of opioids by one tablet each day Next start to increase  the time between doses. The last dose that should be eliminated is the evening dose.      Weight bearing as tolerated   Complete by: As directed    Weight bearing as tolerated   Complete by: As directed         Follow-up Information     Frederik Pear, MD. Go on 01/11/2022.   Specialty: Orthopedic Surgery Why: Your appointment is scheduled for 10:30 Contact information: [place] [address] [phone]         [facility] [place] Follow up.  Specialty: Home Health Services Why: HHPT will provide 6 home visits prior to starting outpatient physical therapy. Contact information: [address] [place] [address] [phone]         [facility], [place]. Go on 01/11/2022.   Why: you will start outpatient physical therapy at 12:00. Please go over to the therapy side after your MD appointment to complete your paperwork Contact information: [facility] Therapy [place] [address] [phone]                  Signed: Joanell Rising 01/04/2022, 8:36 AM

## 2022-01-04 NOTE — Plan of Care (Signed)
°  Problem: Pain Management: Goal: Pain level will decrease with appropriate interventions Outcome: Progressing   Problem: Coping: Goal: Level of anxiety will decrease Outcome: Progressing   Problem: Safety: Goal: Ability to remain free from injury will improve Outcome: Progressing

## 2022-01-04 NOTE — Progress Notes (Signed)
Report called to Ambulance person at [facility]

## 2022-01-04 NOTE — Progress Notes (Signed)
Physical Therapy Treatment Patient Details Name: Melody Casey MRN: [id number] DOB: 19-Jun-1946 Today's Date: 01/04/2022   History of Present Illness Pt is a 76 year old female s/p Left TKA on 01/01/22.  PMHx: HTN, DM, breast cancer, CAD, afib, colostomy    PT Comments    Pod #3 General Comments: AxO x 2 following all directions slightly improved from last session  Pt aware she is going to Rehab.  General bed mobility comments: did NOT apply KI this session and first assisted to [facility] as pt was found incont urine in bed. Pt required Max Asisst x 2 with use of bed pad to swival and scoot.  Pt also required assist to support L LE and slowly bend knee such that feet were both on th floor. General transfer comment: Pt required increased time and + 2 assist to first transfer to [facility].  Pt unable to support herself and unable to weight shift onto L LE to advance R LE.  Assisted with a full "wash up" while seated on BSC.  + 2 asisst to stand for peri care.  Static standing ability was also poor due to increased pain and fatigue. General Gait Details: attempted but unable to functionally weighshift and take any forward steps.  BSC switched with recliner from behind pt as she stood. Positioned in recliner to comfort.  Spouse in room.  RN came in and told pt was going to [facility] today.   Recommendations for follow up therapy are one component of a multi-disciplinary discharge planning process, led by the attending physician.  Recommendations may be updated based on patient status, additional functional criteria and insurance authorization.  Follow Up Recommendations  Skilled nursing-short term rehab (<3 hours/day)     Assistance Recommended at Discharge Frequent or constant Supervision/Assistance  Patient can return home with the following Help with stairs or ramp for entrance;Two people to help with walking and/or transfers;Two people to help with bathing/dressing/bathroom   Equipment Recommendations   None recommended by PT    Recommendations for Other Services       Precautions / Restrictions Precautions Precautions: None Precaution Comments: colostomy, hx right foot deformity Restrictions Weight Bearing Restrictions: No Other Position/Activity Restrictions: WBAT     Mobility  Bed Mobility Overal bed mobility: Needs Assistance Bed Mobility: Supine to Sit     Supine to sit: Max assist, Total assist     General bed mobility comments: did NOT apply KI this session and first assisted to [facility] as pt was found incont urine in bed. Pt required Max Asisst x 2 with use of bed pad to swival and scoot.  Pt also required assist to support L LE and slowly bend knee such that feet were both on th floor.    Transfers Overall transfer level: Needs assistance Equipment used: Rolling walker (2 wheels), None Transfers: Sit to/from Stand, Bed to chair/wheelchair/BSC Sit to Stand: Max assist, Total assist, +2 physical assistance, +2 safety/equipment Stand pivot transfers: Max assist, Total assist, +2 physical assistance, +2 safety/equipment         General transfer comment: Pt required increased time and + 2 assist to first transfer to [facility].  Pt unable to support herself and unable to weight shift onto L LE to advance R LE.  Assisted with a full "wash up" while seated on BSC.  + 2 asisst to stand for peri care.  Static standing ability was also poor due to increased pain and fatigue.    Ambulation/Gait   Gait  Distance (Feet): 0 Feet           General Gait Details: attempted but unable to functionally weighshift and take any forward steps.  BSC switched with recliner from behind pt as she stood.   Stairs             Wheelchair Mobility    Modified Rankin (Stroke Patients Only)       Balance                                            Cognition Arousal/Alertness: Awake/alert   Overall Cognitive Status: Within Functional Limits for tasks assessed                                  General Comments: AxO x 2 following all directions slightly improved from last session        Exercises      General Comments        Pertinent Vitals/Pain Pain Assessment Pain Assessment: Faces Faces Pain Scale: Hurts little more Pain Location: left knee Pain Descriptors / Indicators: Sore, Aching, Tightness, Tender, Operative site guarding Pain Intervention(s): Monitored during session, Premedicated before session, Repositioned    Home Living                          Prior Function            PT Goals (current goals can now be found in the care plan section) Progress towards PT goals: Progressing toward goals    Frequency    7X/week      PT Plan Current plan remains appropriate    Co-evaluation              AM-PAC PT "6 Clicks" Mobility   Outcome Measure  Help needed turning from your back to your side while in a flat bed without using bedrails?: Total Help needed moving from lying on your back to sitting on the side of a flat bed without using bedrails?: Total Help needed moving to and from a bed to a chair (including a wheelchair)?: Total Help needed standing up from a chair using your arms (e.g., wheelchair or bedside chair)?: Total Help needed to walk in hospital room?: Total Help needed climbing 3-5 steps with a railing? : Total 6 Click Score: 6    End of Session Equipment Utilized During Treatment: Gait belt Activity Tolerance: Patient limited by fatigue;Patient limited by pain Patient left: in chair;with call bell/phone within reach;with chair alarm set Nurse Communication: Mobility status PT Visit Diagnosis: Other abnormalities of gait and mobility (R26.89)     Time: 3143-8887 PT Time Calculation (min) (ACUTE ONLY): 38 min  Charges:  $Therapeutic Activity: 38-52 mins                     {Yovanny Coats  PTA Acute  Rehabilitation Services Pager      [phone] Office       [phone]

## 2022-01-04 NOTE — Progress Notes (Signed)
PATIENT ID: Melody Casey  MRN: [id number]  DOB/AGE:  1946/06/15 / 76 y.o.  3 Days Post-Op Procedure(s) (LRB): LEFT TOTAL KNEE ARTHROPLASTY (Left)    PROGRESS NOTE Subjective: Patient is alert, oriented, no Nausea, no Vomiting, yes passing gas. Taking PO well. Denies SOB, Chest or Calf Pain. Using Incentive Spirometer, PAS in place. Ambulate WBAT with pt working on transfers, Patient reports pain as 6/10 .    Objective: Vital signs in last 24 hours: Vitals:   01/03/22 0629 01/03/22 1318 01/03/22 2056 01/04/22 0600  BP: (!) 144/69 (!) 161/62 (!) 147/98 (!) 155/71  Pulse: 74 70 83 81  Resp: 16 16 16 16   Temp: 98.7 F (37.1 C) 98.3 F (36.8 C) 98.3 F (36.8 C) 98 F (36.7 C)  TempSrc: Oral Oral Oral Oral  SpO2: 94% 94% 92% 95%  Weight:      Height:          Intake/Output from previous day: I/O last 3 completed shifts: In: -  Out: 250 [Urine:250]   Intake/Output this shift: No intake/output data recorded.   LABORATORY DATA: Recent Labs    01/01/22 1039 01/01/22 1429 01/01/22 1855 01/01/22 1855 01/02/22 0336 01/03/22 0328 01/04/22 0314  WBC  --   --  6.7   < > 7.4 8.2 7.2  HGB  --   --  11.5*   < > 10.8* 10.1* 9.3*  HCT  --   --  35.0*   < > 32.3* 31.0* 28.5*  PLT  --   --  141*   < > 142* 158 150  NA  --   --   --   --  133*  --   --   K  --   --   --   --  4.2  --   --   CL  --   --   --   --  105  --   --   CO2  --   --   --   --  23  --   --   BUN  --   --   --   --  13  --   --   CREATININE  --   --  0.60  --  0.57  --   --   GLUCOSE  --   --   --   --  216*  --   --   GLUCAP 152* 152*  --   --   --   --   --   INR  --   --   --    < > 1.2 1.5* 1.8*  CALCIUM  --   --   --   --  9.5  --   --    < > = values in this interval not displayed.    Examination: Neurologically intact ABD soft Neurovascular intact Sensation intact distally Intact pulses distally Dorsiflexion/Plantar flexion intact Incision: dressing C/D/I and scant drainage No cellulitis  present Compartment soft}  Assessment:   3 Days Post-Op Procedure(s) (LRB): LEFT TOTAL KNEE ARTHROPLASTY (Left) ADDITIONAL DIAGNOSIS: Expected Acute Blood Loss Anemia, Hypertension and Cardiac Arrythmia afib , hx scleroderma RLE.  Anticipated LOS equal to or greater than 2 midnights due to - Age 34 and older with one or more of the following:  - Obesity  - Expected need for hospital services (PT, OT, Nursing) required for safe  discharge  - Anticipated need for postoperative skilled nursing care or inpatient  rehab  - Active co-morbidities: Cardiac Arrhythmia OR   - Unanticipated findings during/Post Surgery: Slow post-op progression: GI, pain control, mobility     Plan:  PT/OT WBAT with knee immobilizer, AROM and PROM Knee immobilizer may be helpful when not working on ROM DVT Prophylaxis:  [id number], lovenox to coumadin bridge DISCHARGE PLAN: SNF DISCHARGE NEEDS: HHPT, Walker, and 3-in-1 comode seat     Joanell Rising 01/04/2022, 8:05 AM

## 2022-01-08 DIAGNOSIS — R2242 Localized swelling, mass and lump, left lower limb: Secondary | ICD-10-CM | POA: Diagnosis not present

## 2022-01-08 DIAGNOSIS — E860 Dehydration: Secondary | ICD-10-CM | POA: Diagnosis not present

## 2022-01-08 DIAGNOSIS — R070 Pain in throat: Secondary | ICD-10-CM | POA: Diagnosis not present

## 2022-01-08 DIAGNOSIS — M79605 Pain in left leg: Secondary | ICD-10-CM | POA: Diagnosis not present

## 2022-01-08 DIAGNOSIS — L853 Xerosis cutis: Secondary | ICD-10-CM | POA: Diagnosis not present

## 2022-01-09 DIAGNOSIS — M79604 Pain in right leg: Secondary | ICD-10-CM | POA: Diagnosis not present

## 2022-01-09 DIAGNOSIS — D1800 Hemangioma unspecified site: Secondary | ICD-10-CM | POA: Diagnosis not present

## 2022-01-09 DIAGNOSIS — I480 Paroxysmal atrial fibrillation: Secondary | ICD-10-CM | POA: Diagnosis not present

## 2022-01-09 DIAGNOSIS — M79605 Pain in left leg: Secondary | ICD-10-CM | POA: Diagnosis not present

## 2022-01-09 DIAGNOSIS — Z9889 Other specified postprocedural states: Secondary | ICD-10-CM | POA: Diagnosis not present

## 2022-01-09 DIAGNOSIS — I10 Essential (primary) hypertension: Secondary | ICD-10-CM | POA: Diagnosis not present

## 2022-01-09 DIAGNOSIS — E785 Hyperlipidemia, unspecified: Secondary | ICD-10-CM | POA: Diagnosis not present

## 2022-01-10 DIAGNOSIS — I4891 Unspecified atrial fibrillation: Secondary | ICD-10-CM | POA: Diagnosis not present

## 2022-01-11 DIAGNOSIS — I4819 Other persistent atrial fibrillation: Secondary | ICD-10-CM | POA: Diagnosis not present

## 2022-01-11 DIAGNOSIS — M1712 Unilateral primary osteoarthritis, left knee: Secondary | ICD-10-CM | POA: Diagnosis not present

## 2022-01-11 DIAGNOSIS — R791 Abnormal coagulation profile: Secondary | ICD-10-CM | POA: Diagnosis not present

## 2022-01-11 DIAGNOSIS — Z9889 Other specified postprocedural states: Secondary | ICD-10-CM | POA: Diagnosis not present

## 2022-01-11 DIAGNOSIS — Z7901 Long term (current) use of anticoagulants: Secondary | ICD-10-CM | POA: Diagnosis not present

## 2022-01-12 DIAGNOSIS — I4891 Unspecified atrial fibrillation: Secondary | ICD-10-CM | POA: Diagnosis not present

## 2022-01-18 DIAGNOSIS — R739 Hyperglycemia, unspecified: Secondary | ICD-10-CM | POA: Diagnosis not present

## 2022-01-18 DIAGNOSIS — Z7901 Long term (current) use of anticoagulants: Secondary | ICD-10-CM | POA: Diagnosis not present

## 2022-01-18 DIAGNOSIS — D649 Anemia, unspecified: Secondary | ICD-10-CM | POA: Diagnosis not present

## 2022-01-18 DIAGNOSIS — E8809 Other disorders of plasma-protein metabolism, not elsewhere classified: Secondary | ICD-10-CM | POA: Diagnosis not present

## 2022-01-18 DIAGNOSIS — E871 Hypo-osmolality and hyponatremia: Secondary | ICD-10-CM | POA: Diagnosis not present

## 2022-01-19 DIAGNOSIS — Z96652 Presence of left artificial knee joint: Secondary | ICD-10-CM | POA: Diagnosis not present

## 2022-01-19 DIAGNOSIS — M6281 Muscle weakness (generalized): Secondary | ICD-10-CM | POA: Diagnosis not present

## 2022-01-19 DIAGNOSIS — R2689 Other abnormalities of gait and mobility: Secondary | ICD-10-CM | POA: Diagnosis not present

## 2022-01-24 ENCOUNTER — Ambulatory Visit: Payer: Medicare Other | Admitting: Family Medicine

## 2022-01-24 DIAGNOSIS — R2689 Other abnormalities of gait and mobility: Secondary | ICD-10-CM | POA: Diagnosis not present

## 2022-01-24 DIAGNOSIS — Z4789 Encounter for other orthopedic aftercare: Secondary | ICD-10-CM | POA: Diagnosis not present

## 2022-01-24 DIAGNOSIS — M6281 Muscle weakness (generalized): Secondary | ICD-10-CM | POA: Diagnosis not present

## 2022-01-24 DIAGNOSIS — M25562 Pain in left knee: Secondary | ICD-10-CM | POA: Diagnosis not present

## 2022-01-26 DIAGNOSIS — M6281 Muscle weakness (generalized): Secondary | ICD-10-CM | POA: Diagnosis not present

## 2022-01-26 DIAGNOSIS — M25562 Pain in left knee: Secondary | ICD-10-CM | POA: Diagnosis not present

## 2022-01-26 DIAGNOSIS — R2689 Other abnormalities of gait and mobility: Secondary | ICD-10-CM | POA: Diagnosis not present

## 2022-01-26 DIAGNOSIS — Z4789 Encounter for other orthopedic aftercare: Secondary | ICD-10-CM | POA: Diagnosis not present

## 2022-02-01 DIAGNOSIS — R2689 Other abnormalities of gait and mobility: Secondary | ICD-10-CM | POA: Diagnosis not present

## 2022-02-01 DIAGNOSIS — Z4789 Encounter for other orthopedic aftercare: Secondary | ICD-10-CM | POA: Diagnosis not present

## 2022-02-01 DIAGNOSIS — M25562 Pain in left knee: Secondary | ICD-10-CM | POA: Diagnosis not present

## 2022-02-01 DIAGNOSIS — M6281 Muscle weakness (generalized): Secondary | ICD-10-CM | POA: Diagnosis not present

## 2022-02-05 DIAGNOSIS — M25562 Pain in left knee: Secondary | ICD-10-CM | POA: Diagnosis not present

## 2022-02-05 DIAGNOSIS — Z4789 Encounter for other orthopedic aftercare: Secondary | ICD-10-CM | POA: Diagnosis not present

## 2022-02-05 DIAGNOSIS — R2689 Other abnormalities of gait and mobility: Secondary | ICD-10-CM | POA: Diagnosis not present

## 2022-02-05 DIAGNOSIS — M6281 Muscle weakness (generalized): Secondary | ICD-10-CM | POA: Diagnosis not present

## 2022-02-09 DIAGNOSIS — M25562 Pain in left knee: Secondary | ICD-10-CM | POA: Diagnosis not present

## 2022-02-09 DIAGNOSIS — R2689 Other abnormalities of gait and mobility: Secondary | ICD-10-CM | POA: Diagnosis not present

## 2022-02-09 DIAGNOSIS — Z4789 Encounter for other orthopedic aftercare: Secondary | ICD-10-CM | POA: Diagnosis not present

## 2022-02-09 DIAGNOSIS — M6281 Muscle weakness (generalized): Secondary | ICD-10-CM | POA: Diagnosis not present

## 2022-02-12 DIAGNOSIS — Z4789 Encounter for other orthopedic aftercare: Secondary | ICD-10-CM | POA: Diagnosis not present

## 2022-02-12 DIAGNOSIS — M6281 Muscle weakness (generalized): Secondary | ICD-10-CM | POA: Diagnosis not present

## 2022-02-12 DIAGNOSIS — M25562 Pain in left knee: Secondary | ICD-10-CM | POA: Diagnosis not present

## 2022-02-12 DIAGNOSIS — R2689 Other abnormalities of gait and mobility: Secondary | ICD-10-CM | POA: Diagnosis not present

## 2022-02-15 DIAGNOSIS — Z4789 Encounter for other orthopedic aftercare: Secondary | ICD-10-CM | POA: Diagnosis not present

## 2022-02-15 DIAGNOSIS — M25562 Pain in left knee: Secondary | ICD-10-CM | POA: Diagnosis not present

## 2022-02-15 DIAGNOSIS — R2689 Other abnormalities of gait and mobility: Secondary | ICD-10-CM | POA: Diagnosis not present

## 2022-02-15 DIAGNOSIS — M6281 Muscle weakness (generalized): Secondary | ICD-10-CM | POA: Diagnosis not present

## 2022-02-19 DIAGNOSIS — R2689 Other abnormalities of gait and mobility: Secondary | ICD-10-CM | POA: Diagnosis not present

## 2022-02-19 DIAGNOSIS — M6281 Muscle weakness (generalized): Secondary | ICD-10-CM | POA: Diagnosis not present

## 2022-02-19 DIAGNOSIS — M25562 Pain in left knee: Secondary | ICD-10-CM | POA: Diagnosis not present

## 2022-02-19 DIAGNOSIS — Z4789 Encounter for other orthopedic aftercare: Secondary | ICD-10-CM | POA: Diagnosis not present

## 2022-02-22 DIAGNOSIS — M25562 Pain in left knee: Secondary | ICD-10-CM | POA: Diagnosis not present

## 2022-02-22 DIAGNOSIS — R2689 Other abnormalities of gait and mobility: Secondary | ICD-10-CM | POA: Diagnosis not present

## 2022-02-22 DIAGNOSIS — Z4789 Encounter for other orthopedic aftercare: Secondary | ICD-10-CM | POA: Diagnosis not present

## 2022-02-22 DIAGNOSIS — M6281 Muscle weakness (generalized): Secondary | ICD-10-CM | POA: Diagnosis not present

## 2022-02-23 ENCOUNTER — Ambulatory Visit (INDEPENDENT_AMBULATORY_CARE_PROVIDER_SITE_OTHER): Payer: Medicare Other | Admitting: Family Medicine

## 2022-02-23 ENCOUNTER — Encounter: Payer: Self-pay | Admitting: Family Medicine

## 2022-02-23 VITALS — BP 137/70 | HR 63 | Ht 67.0 in | Wt 175.0 lb

## 2022-02-23 DIAGNOSIS — Z79899 Other long term (current) drug therapy: Secondary | ICD-10-CM

## 2022-02-23 DIAGNOSIS — F411 Generalized anxiety disorder: Secondary | ICD-10-CM | POA: Diagnosis not present

## 2022-02-23 DIAGNOSIS — Z7901 Long term (current) use of anticoagulants: Secondary | ICD-10-CM

## 2022-02-23 DIAGNOSIS — R231 Pallor: Secondary | ICD-10-CM

## 2022-02-23 DIAGNOSIS — I4819 Other persistent atrial fibrillation: Secondary | ICD-10-CM | POA: Diagnosis not present

## 2022-02-23 DIAGNOSIS — I480 Paroxysmal atrial fibrillation: Secondary | ICD-10-CM

## 2022-02-23 LAB — CBC WITH DIFFERENTIAL/PLATELET
Basophils Absolute: 0 10*3/uL (ref 0.0–0.2)
Basos: 1 %
EOS (ABSOLUTE): 0.1 10*3/uL (ref 0.0–0.4)
Eos: 3 %
Hematocrit: 36.7 % (ref 34.0–46.6)
Hemoglobin: 11.8 g/dL (ref 11.1–15.9)
Immature Grans (Abs): 0 10*3/uL (ref 0.0–0.1)
Immature Granulocytes: 0 %
Lymphocytes Absolute: 1.8 10*3/uL (ref 0.7–3.1)
Lymphs: 35 %
MCH: 28.4 pg (ref 26.6–33.0)
MCHC: 32.2 g/dL (ref 31.5–35.7)
MCV: 88 fL (ref 79–97)
Monocytes Absolute: 0.3 10*3/uL (ref 0.1–0.9)
Monocytes: 7 %
Neutrophils Absolute: 2.9 10*3/uL (ref 1.4–7.0)
Neutrophils: 54 %
Platelets: 203 10*3/uL (ref 150–450)
RBC: 4.16 x10E6/uL (ref 3.77–5.28)
RDW: 14.3 % (ref 11.7–15.4)
WBC: 5.3 10*3/uL (ref 3.4–10.8)

## 2022-02-23 LAB — COAGUCHEK XS/INR WAIVED
INR: 2.7 — ABNORMAL HIGH (ref 0.9–1.1)
Prothrombin Time: 32.8 s

## 2022-02-23 MED ORDER — METOPROLOL SUCCINATE ER 25 MG PO TB24: ORAL_TABLET | ORAL | 3 refills | Status: AC

## 2022-02-23 MED ORDER — WARFARIN SODIUM 5 MG PO TABS: ORAL_TABLET | ORAL | 3 refills | Status: DC

## 2022-02-23 MED ORDER — ALPRAZOLAM 0.25 MG PO TABS: 0.2500 mg | ORAL_TABLET | Freq: Three times a day (TID) | ORAL | 2 refills | Status: DC | PRN

## 2022-02-23 NOTE — Progress Notes (Signed)
? ?BP 137/70   Pulse 63   Ht 5\' 7"  (1.702 m)   Wt 175 lb (79.4 kg)   SpO2 98%   BMI 27.41 kg/m?   ? ?Subjective:  ? ?Patient ID: Melody Casey, female    DOB: 08/16/1946, 76 y.o.   MRN: [id number] ? ?HPI: ?Melody Casey is a 76 y.o. female presenting on 02/23/2022 for Medical Management of Chronic Issues and atrial fibrilation ? ? ?HPI ?Coumadin recheck ?Target goal: 2.0-3.0 ?Reason on anticoagulation: A-fib ?Patient denies any bruising or bleeding or chest pain or palpitations  ?Patient's friends say that she has been a little bit pale and not the same color although she denies any weakness or lightheadedness or dizziness or chest pain. ? ?Relevant past medical, surgical, family and social history reviewed and updated as indicated. Interim medical history since our last visit reviewed. ?Allergies and medications reviewed and updated. ? ?Review of Systems  ?Constitutional:  Negative for chills and fever.  ?Eyes:  Negative for visual disturbance.  ?Respiratory:  Negative for chest tightness and shortness of breath.   ?Cardiovascular:  Negative for chest pain and leg swelling.  ?Musculoskeletal:  Negative for back pain and gait problem.  ?Skin:  Negative for rash.  ?Neurological:  Negative for light-headedness and headaches.  ?Psychiatric/Behavioral:  Negative for agitation and behavioral problems.   ?All other systems reviewed and are negative. ? ?Per HPI unless specifically indicated above ? ? ?Allergies as of 02/23/2022   ? ?   Reactions  ? Miralax [polyethylene Glycol] Swelling, Rash  ? Took [facility] brand developed rash. Patient states she tolerated name brand MiraLax in the past. ?09/01/15. Patient states she had diffuse swelling including swelling of her lips.   ? Vancomycin Rash, Shortness Of Breath  ? Acetaminophen Hives  ? Banana Other (See Comments)  ? Upset stomach  ? Oxycodone-acetaminophen Itching  ? Sulfa Antibiotics Rash  ? All over rash  ? Sulfacetamide Sodium Rash  ? All over rash  ? Sulfasalazine Rash  ?  All over rash  ? Latex Rash  ? Tape Rash  ? ?  ? ?  ?Medication List  ?  ? ?  ? Accurate as of February 23, 2022  2:13 PM. If you have any questions, ask your nurse or doctor.  ?  ?  ? ?  ? ?ALPRAZolam 0.25 MG tablet ?Commonly known as: Duanne Moron ?Take 1 tablet (0.25 mg total) by mouth 3 (three) times daily as needed for anxiety. ?  ?amLODipine 5 MG tablet ?Commonly known as: NORVASC ?TAKE 1 TABLET(5 MG) BY MOUTH DAILY ?  ?atorvastatin 40 MG tablet ?Commonly known as: LIPITOR ?TAKE 1 AND 1/2 TABLETS BY MOUTH EVERY DAY ?What changed:  ?when to take this ?additional instructions ?  ?cholecalciferol 25 MCG (1000 UNIT) tablet ?Commonly known as: VITAMIN D3 ?Take 1,000 Units by mouth daily. ?  ?dofetilide 250 MCG capsule ?Commonly known as: TIKOSYN ?Take 1 capsule (250 mcg total) by mouth 2 (two) times daily. ?  ?enoxaparin 40 MG/0.4ML injection ?Commonly known as: LOVENOX ?Inject 0.4 mLs (40 mg total) into the skin daily. ?  ?HYDROmorphone 2 MG tablet ?Commonly known as: Dilaudid ?Take 1 tablet (2 mg total) by mouth every 4 (four) hours as needed for severe pain. ?  ?hydroxychloroquine 200 MG tablet ?Commonly known as: PLAQUENIL ?Take 1 tablet (200 mg total) by mouth 2 (two) times daily. ?  ?lisinopril 20 MG tablet ?Commonly known as: ZESTRIL ?Take 1 tablet (20 mg total) by mouth daily. ?  ?  magnesium oxide 400 MG tablet ?Commonly known as: MAG-OX ?Take 400 mg by mouth 2 (two) times daily. ?  ?meclizine 25 MG tablet ?Commonly known as: ANTIVERT ?Take 1 tablet (25 mg total) by mouth 3 (three) times daily as needed for dizziness. ?  ?metoprolol succinate 25 MG 24 hr tablet ?Commonly known as: TOPROL-XL ?Take one tablet daily ?What changed: See the new instructions. ?Changed by: Worthy Rancher, MD ?  ?potassium chloride 10 MEQ tablet ?Commonly known as: KLOR-CON M ?TAKE 1 TABLET BY MOUTH DAILY ?  ?tiZANidine 2 MG tablet ?Commonly known as: ZANAFLEX ?Take 1 tablet (2 mg total) by mouth every 6 (six) hours as needed. ?   ?warfarin 5 MG tablet ?Commonly known as: COUMADIN ?Take as directed by the anticoagulation clinic. If you are unsure how to take this medication, talk to your nurse or doctor. ?Original instructions: TAKE 1 AND 1/2 TABLETS(7.5 MG) BY MOUTH DAILY ?What changed:  ?how much to take ?how to take this ?when to take this ?additional instructions ?  ? ?  ? ? ? ?Objective:  ? ?BP 137/70   Pulse 63   Ht 5\' 7"  (1.702 m)   Wt 175 lb (79.4 kg)   SpO2 98%   BMI 27.41 kg/m?   ?Wt Readings from Last 3 Encounters:  ?02/23/22 175 lb (79.4 kg)  ?01/01/22 188 lb 4.4 oz (85.4 kg)  ?12/25/21 188 lb 4 oz (85.4 kg)  ?  ?Physical Exam ?Vitals and nursing note reviewed.  ?Constitutional:   ?   General: She is not in acute distress. ?   Appearance: She is well-developed. She is not diaphoretic.  ?Eyes:  ?   Conjunctiva/sclera: Conjunctivae normal.  ?Cardiovascular:  ?   Rate and Rhythm: Normal rate. Rhythm irregular.  ?   Heart sounds: Normal heart sounds. No murmur heard. ?Pulmonary:  ?   Effort: Pulmonary effort is normal. No respiratory distress.  ?   Breath sounds: Normal breath sounds. No wheezing.  ?Skin: ?   General: Skin is warm and dry.  ?   Findings: No rash.  ?Neurological:  ?   Mental Status: She is alert and oriented to person, place, and time.  ?   Coordination: Coordination normal.  ?Psychiatric:     ?   Behavior: Behavior normal.  ? ? ? ? ?Assessment & Plan:  ? ?Problem List Items Addressed This Visit   ? ?  ? Cardiovascular and Mediastinum  ? Persistent atrial fibrillation (HCC) - Primary  ? Relevant Medications  ? metoprolol succinate (TOPROL-XL) 25 MG 24 hr tablet  ? warfarin (COUMADIN) 5 MG tablet  ? Other Relevant Orders  ? CoaguChek XS/INR Waived  ?  ? Other  ? High risk medication use  ? ?Other Visit Diagnoses   ? ? Anxiety state      ? Relevant Medications  ? ALPRAZolam (XANAX) 0.25 MG tablet  ? Chronic anticoagulation      ? Relevant Medications  ? metoprolol succinate (TOPROL-XL) 25 MG 24 hr tablet  ? PAF  (paroxysmal atrial fibrillation) ([place])      ? Relevant Medications  ? metoprolol succinate (TOPROL-XL) 25 MG 24 hr tablet  ? warfarin (COUMADIN) 5 MG tablet  ? Pale      ? Relevant Orders  ? CBC with Differential/Platelet  ? ?  ?  ?Description   ?continue weekly dose to take 1/2 tablets or 7.5 mg on Mondays and Thursdays and take 5 mg all other days.  ? ?INR 2.7  today. Goal 2.0-3.0, improved, no change for now ? ?Recheck in 6-8 weeks ?  ? ?Patient says her friends and nobody been telling her she is little pale since she had her knee replacement surgery, will check her hemoglobin today. ? ?Follow up plan: ?Return if symptoms worsen or fail to improve, for 6 to 8-week INR recheck. ? ?Counseling provided for all of the vaccine components ?Orders Placed This Encounter  ?Procedures  ? CoaguChek XS/INR Waived  ? CBC with Differential/Platelet  ? ? ?[name], MD ?[place] ?02/23/2022, 2:13 PM ? ? ? ? ?

## 2022-02-27 DIAGNOSIS — M6281 Muscle weakness (generalized): Secondary | ICD-10-CM | POA: Diagnosis not present

## 2022-02-27 DIAGNOSIS — Z4789 Encounter for other orthopedic aftercare: Secondary | ICD-10-CM | POA: Diagnosis not present

## 2022-02-27 DIAGNOSIS — R2689 Other abnormalities of gait and mobility: Secondary | ICD-10-CM | POA: Diagnosis not present

## 2022-02-27 DIAGNOSIS — M25562 Pain in left knee: Secondary | ICD-10-CM | POA: Diagnosis not present

## 2022-03-02 DIAGNOSIS — Z4789 Encounter for other orthopedic aftercare: Secondary | ICD-10-CM | POA: Diagnosis not present

## 2022-03-02 DIAGNOSIS — M6281 Muscle weakness (generalized): Secondary | ICD-10-CM | POA: Diagnosis not present

## 2022-03-02 DIAGNOSIS — R2689 Other abnormalities of gait and mobility: Secondary | ICD-10-CM | POA: Diagnosis not present

## 2022-03-02 DIAGNOSIS — M25562 Pain in left knee: Secondary | ICD-10-CM | POA: Diagnosis not present

## 2022-03-05 DIAGNOSIS — M25562 Pain in left knee: Secondary | ICD-10-CM | POA: Diagnosis not present

## 2022-03-05 DIAGNOSIS — Z4789 Encounter for other orthopedic aftercare: Secondary | ICD-10-CM | POA: Diagnosis not present

## 2022-03-05 DIAGNOSIS — M6281 Muscle weakness (generalized): Secondary | ICD-10-CM | POA: Diagnosis not present

## 2022-03-05 DIAGNOSIS — R2689 Other abnormalities of gait and mobility: Secondary | ICD-10-CM | POA: Diagnosis not present

## 2022-03-08 DIAGNOSIS — Z96652 Presence of left artificial knee joint: Secondary | ICD-10-CM | POA: Diagnosis not present

## 2022-03-08 DIAGNOSIS — Z9889 Other specified postprocedural states: Secondary | ICD-10-CM | POA: Diagnosis not present

## 2022-03-08 DIAGNOSIS — M1712 Unilateral primary osteoarthritis, left knee: Secondary | ICD-10-CM | POA: Diagnosis not present

## 2022-03-09 DIAGNOSIS — M6281 Muscle weakness (generalized): Secondary | ICD-10-CM | POA: Diagnosis not present

## 2022-03-09 DIAGNOSIS — M25562 Pain in left knee: Secondary | ICD-10-CM | POA: Diagnosis not present

## 2022-03-09 DIAGNOSIS — Z4789 Encounter for other orthopedic aftercare: Secondary | ICD-10-CM | POA: Diagnosis not present

## 2022-03-09 DIAGNOSIS — R2689 Other abnormalities of gait and mobility: Secondary | ICD-10-CM | POA: Diagnosis not present

## 2022-03-12 DIAGNOSIS — M25562 Pain in left knee: Secondary | ICD-10-CM | POA: Diagnosis not present

## 2022-03-12 DIAGNOSIS — R2689 Other abnormalities of gait and mobility: Secondary | ICD-10-CM | POA: Diagnosis not present

## 2022-03-12 DIAGNOSIS — Z4789 Encounter for other orthopedic aftercare: Secondary | ICD-10-CM | POA: Diagnosis not present

## 2022-03-12 DIAGNOSIS — M6281 Muscle weakness (generalized): Secondary | ICD-10-CM | POA: Diagnosis not present

## 2022-03-15 DIAGNOSIS — M6281 Muscle weakness (generalized): Secondary | ICD-10-CM | POA: Diagnosis not present

## 2022-03-15 DIAGNOSIS — M25562 Pain in left knee: Secondary | ICD-10-CM | POA: Diagnosis not present

## 2022-03-15 DIAGNOSIS — R2689 Other abnormalities of gait and mobility: Secondary | ICD-10-CM | POA: Diagnosis not present

## 2022-03-15 DIAGNOSIS — Z4789 Encounter for other orthopedic aftercare: Secondary | ICD-10-CM | POA: Diagnosis not present

## 2022-04-10 DIAGNOSIS — E1142 Type 2 diabetes mellitus with diabetic polyneuropathy: Secondary | ICD-10-CM | POA: Diagnosis not present

## 2022-04-10 DIAGNOSIS — L84 Corns and callosities: Secondary | ICD-10-CM | POA: Diagnosis not present

## 2022-04-10 DIAGNOSIS — M79676 Pain in unspecified toe(s): Secondary | ICD-10-CM | POA: Diagnosis not present

## 2022-04-10 DIAGNOSIS — B351 Tinea unguium: Secondary | ICD-10-CM | POA: Diagnosis not present

## 2022-04-20 ENCOUNTER — Ambulatory Visit (INDEPENDENT_AMBULATORY_CARE_PROVIDER_SITE_OTHER): Payer: Medicare Other

## 2022-04-20 ENCOUNTER — Ambulatory Visit (INDEPENDENT_AMBULATORY_CARE_PROVIDER_SITE_OTHER): Payer: Medicare Other | Admitting: Family Medicine

## 2022-04-20 ENCOUNTER — Encounter: Payer: Self-pay | Admitting: Family Medicine

## 2022-04-20 VITALS — BP 144/60 | HR 59 | Temp 97.6°F | Resp 20 | Ht 67.0 in | Wt 178.0 lb

## 2022-04-20 DIAGNOSIS — J40 Bronchitis, not specified as acute or chronic: Secondary | ICD-10-CM

## 2022-04-20 DIAGNOSIS — I4819 Other persistent atrial fibrillation: Secondary | ICD-10-CM

## 2022-04-20 DIAGNOSIS — E785 Hyperlipidemia, unspecified: Secondary | ICD-10-CM

## 2022-04-20 DIAGNOSIS — J189 Pneumonia, unspecified organism: Secondary | ICD-10-CM | POA: Diagnosis not present

## 2022-04-20 DIAGNOSIS — R062 Wheezing: Secondary | ICD-10-CM

## 2022-04-20 DIAGNOSIS — E118 Type 2 diabetes mellitus with unspecified complications: Secondary | ICD-10-CM | POA: Diagnosis not present

## 2022-04-20 DIAGNOSIS — Z79899 Other long term (current) drug therapy: Secondary | ICD-10-CM

## 2022-04-20 DIAGNOSIS — I10 Essential (primary) hypertension: Secondary | ICD-10-CM

## 2022-04-20 LAB — BAYER DCA HB A1C WAIVED: HB A1C (BAYER DCA - WAIVED): 6.4 % — ABNORMAL HIGH (ref 4.8–5.6)

## 2022-04-20 LAB — COAGUCHEK XS/INR WAIVED
INR: 2.6 — ABNORMAL HIGH (ref 0.9–1.1)
Prothrombin Time: 31.2 s

## 2022-04-20 MED ORDER — ALBUTEROL SULFATE HFA 108 (90 BASE) MCG/ACT IN AERS: 2.0000 | INHALATION_SPRAY | Freq: Four times a day (QID) | RESPIRATORY_TRACT | 0 refills | Status: DC | PRN

## 2022-04-20 MED ORDER — AMOXICILLIN 500 MG PO CAPS: 500.0000 mg | ORAL_CAPSULE | Freq: Two times a day (BID) | ORAL | 0 refills | Status: DC

## 2022-04-20 NOTE — Progress Notes (Signed)
? ?BP (!) 144/60   Pulse (!) 59   Temp 97.6 ?F (36.4 ?C) (Temporal)   Resp 20   Ht 5' 7"  (1.702 m)   Wt 178 lb (80.7 kg)   SpO2 92%   BMI 27.88 kg/m?   ? ?Subjective:  ? ?Patient ID: Melody Casey, female    DOB: 1946/03/13, 76 y.o.   MRN: [id number] ? ?HPI: ?Melody Casey is a 76 y.o. female presenting on 04/20/2022 for Anticoagulation and Diabetes ? ? ?HPI ?Coumadin recheck ?Target goal: 2.0-3.0 ?Reason on anticoagulation: A-fib ?Patient denies any bruising or bleeding or chest pain or palpitations  ? ?Type 2 diabetes mellitus ?Patient comes in today for recheck of his diabetes. Patient has been currently taking no medication currently, has been diet controlled. Patient is currently on an ACE inhibitor/ARB. Patient has not seen an ophthalmologist this year. Patient denies any issues with their feet. The symptom started onset as an adult hypertension and hyperlipidemia and CAD ARE RELATED TO DM  ? ?Hypertension ?Patient is currently on lisinopril and metoprolol and amlodipine, and their blood pressure today is 144/60. Patient denies any lightheadedness or dizziness. Patient denies headaches, blurred vision, chest pains, shortness of breath, or weakness. Denies any side effects from medication and is content with current medication.  ? ?Hyperlipidemia ?Patient is coming in for recheck of his hyperlipidemia. The patient is currently taking atorvastatin. They deny any issues with myalgias or history of liver damage from it. They deny any focal numbness or weakness or chest pain.  ? ?Patient has been having a lot more congestion and feels like she is having wheezing especially on the right side of her chest this been going on over the past few weeks and does not seem to be improving and she is just worried that she is starting to get something in her lungs.  She denies any fevers or chills but she does have a productive cough and then the wheeze and starting to feel somewhat short of breath.  I ? ?Relevant past  medical, surgical, family and social history reviewed and updated as indicated. Interim medical history since our last visit reviewed. ?Allergies and medications reviewed and updated. ? ?Review of Systems  ?Constitutional:  Negative for chills and fever.  ?HENT:  Positive for congestion.   ?Eyes:  Negative for visual disturbance.  ?Respiratory:  Positive for cough and wheezing. Negative for chest tightness and shortness of breath.   ?Cardiovascular:  Negative for chest pain and leg swelling.  ?Musculoskeletal:  Negative for back pain and gait problem.  ?Skin:  Negative for rash.  ?Neurological:  Negative for dizziness, light-headedness and headaches.  ?Psychiatric/Behavioral:  Negative for agitation and behavioral problems.   ?All other systems reviewed and are negative. ? ?Per HPI unless specifically indicated above ? ? ?Allergies as of 04/20/2022   ? ?   Reactions  ? Miralax [polyethylene Glycol] Swelling, Rash  ? Took [facility] brand developed rash. Patient states she tolerated name brand MiraLax in the past. ?09/01/15. Patient states she had diffuse swelling including swelling of her lips.   ? Vancomycin Rash, Shortness Of Breath  ? Acetaminophen Hives  ? Banana Other (See Comments)  ? Upset stomach  ? Oxycodone-acetaminophen Itching  ? Sulfa Antibiotics Rash  ? All over rash  ? Sulfacetamide Sodium Rash  ? All over rash  ? Sulfasalazine Rash  ? All over rash  ? Latex Rash  ? Tape Rash  ? ?  ? ?  ?Medication List  ?  ? ?  ?  Accurate as of Apr 20, 2022 11:35 AM. If you have any questions, ask your nurse or doctor.  ?  ?  ? ?  ? ?STOP taking these medications   ? ?enoxaparin 40 MG/0.4ML injection ?Commonly known as: LOVENOX ?Stopped by: Worthy Rancher, MD ?  ?HYDROmorphone 2 MG tablet ?Commonly known as: Dilaudid ?Stopped by: Worthy Rancher, MD ?  ?tiZANidine 2 MG tablet ?Commonly known as: ZANAFLEX ?Stopped by: Worthy Rancher, MD ?  ? ?  ? ?TAKE these medications   ? ?ALPRAZolam 0.25 MG tablet ?Commonly known  as: Duanne Moron ?Take 1 tablet (0.25 mg total) by mouth 3 (three) times daily as needed for anxiety. ?  ?amLODipine 5 MG tablet ?Commonly known as: NORVASC ?TAKE 1 TABLET(5 MG) BY MOUTH DAILY ?  ?atorvastatin 40 MG tablet ?Commonly known as: LIPITOR ?TAKE 1 AND 1/2 TABLETS BY MOUTH EVERY DAY ?What changed:  ?when to take this ?additional instructions ?  ?cholecalciferol 25 MCG (1000 UNIT) tablet ?Commonly known as: VITAMIN D3 ?Take 1,000 Units by mouth daily. ?  ?dofetilide 250 MCG capsule ?Commonly known as: TIKOSYN ?Take 1 capsule (250 mcg total) by mouth 2 (two) times daily. ?  ?hydroxychloroquine 200 MG tablet ?Commonly known as: PLAQUENIL ?Take 1 tablet (200 mg total) by mouth 2 (two) times daily. ?  ?lisinopril 20 MG tablet ?Commonly known as: ZESTRIL ?Take 1 tablet (20 mg total) by mouth daily. ?  ?magnesium oxide 400 MG tablet ?Commonly known as: MAG-OX ?Take 400 mg by mouth 2 (two) times daily. ?  ?meclizine 25 MG tablet ?Commonly known as: ANTIVERT ?Take 1 tablet (25 mg total) by mouth 3 (three) times daily as needed for dizziness. ?  ?metoprolol succinate 25 MG 24 hr tablet ?Commonly known as: TOPROL-XL ?Take one tablet daily ?  ?potassium chloride 10 MEQ tablet ?Commonly known as: KLOR-CON M ?TAKE 1 TABLET BY MOUTH DAILY ?  ?warfarin 5 MG tablet ?Commonly known as: COUMADIN ?Take as directed by the anticoagulation clinic. If you are unsure how to take this medication, talk to your nurse or doctor. ?Original instructions: TAKE 1 AND 1/2 TABLETS(7.5 MG) BY MOUTH DAILY ?  ? ?  ? ? ? ?Objective:  ? ?BP (!) 144/60   Pulse (!) 59   Temp 97.6 ?F (36.4 ?C) (Temporal)   Resp 20   Ht 5' 7"  (1.702 m)   Wt 178 lb (80.7 kg)   SpO2 92%   BMI 27.88 kg/m?   ?Wt Readings from Last 3 Encounters:  ?04/20/22 178 lb (80.7 kg)  ?02/23/22 175 lb (79.4 kg)  ?01/01/22 188 lb 4.4 oz (85.4 kg)  ?  ?Physical Exam ?Vitals and nursing note reviewed.  ?Constitutional:   ?   General: She is not in acute distress. ?   Appearance: She  is well-developed. She is not diaphoretic.  ?Eyes:  ?   Conjunctiva/sclera: Conjunctivae normal.  ?Cardiovascular:  ?   Rate and Rhythm: Normal rate and regular rhythm.  ?   Heart sounds: Normal heart sounds. No murmur heard. ?Pulmonary:  ?   Effort: Pulmonary effort is normal. No respiratory distress.  ?   Breath sounds: Wheezing and rhonchi present.  ?Musculoskeletal:     ?   General: No tenderness or deformity. Normal range of motion.  ?Skin: ?   General: Skin is warm and dry.  ?   Findings: No rash.  ?Neurological:  ?   Mental Status: She is alert and oriented to person, place, and time.  ?  Coordination: Coordination normal.  ?Psychiatric:     ?   Behavior: Behavior normal.  ? ? ?Chest x-ray: No signs of congestion or pneumonia bronchitis ? ?Assessment & Plan:  ? ?Problem List Items Addressed This Visit   ? ?  ? Cardiovascular and Mediastinum  ? Essential hypertension  ? Relevant Orders  ? CBC with Differential/Platelet  ? CMP14+EGFR  ? Persistent atrial fibrillation (HCC) - Primary  ? Relevant Orders  ? CoaguChek XS/INR Waived  ?  ? Endocrine  ? Type 2 diabetes mellitus with complication, without long-term current use of insulin ([place])  ? Relevant Orders  ? Bayer DCA Hb A1c Waived  ?  ? Other  ? Dyslipidemia, goal LDL below 70  ? Relevant Orders  ? Lipid panel  ? ?Other Visit Diagnoses   ? ? Wheezing      ? Relevant Orders  ? DG Chest 2 View  ? ?  ?  ?Description   ?continue weekly dose to take 1/2 tablets or 7.5 mg on Mondays and Thursdays and take 5 mg all other days.  ? ?INR 2.6 today. Goal 2.0-3.0, improved, no change for now ? ?Recheck in 6-8 weeks ?  ? ? ?Follow up plan: ?Return if symptoms worsen or fail to improve, for 6-8 week INR. ? ?Counseling provided for all of the vaccine components ?Orders Placed This Encounter  ?Procedures  ? DG Chest 2 View  ? CoaguChek XS/INR Waived  ? Bayer DCA Hb A1c Waived  ? CBC with Differential/Platelet  ? CMP14+EGFR  ? Lipid panel  ? ? ?[name], MD ?[place] ?04/20/2022, 11:35 AM ? ? ?  ?

## 2022-04-21 LAB — CMP14+EGFR
ALT: 11 IU/L (ref 0–32)
AST: 15 IU/L (ref 0–40)
Albumin/Globulin Ratio: 1.1 — ABNORMAL LOW (ref 1.2–2.2)
Albumin: 3.7 g/dL (ref 3.7–4.7)
Alkaline Phosphatase: 119 IU/L (ref 44–121)
BUN/Creatinine Ratio: 17 (ref 12–28)
BUN: 14 mg/dL (ref 8–27)
Bilirubin Total: 0.3 mg/dL (ref 0.0–1.2)
CO2: 20 mmol/L (ref 20–29)
Calcium: 10.3 mg/dL (ref 8.7–10.3)
Chloride: 102 mmol/L (ref 96–106)
Creatinine, Ser: 0.82 mg/dL (ref 0.57–1.00)
Globulin, Total: 3.4 g/dL (ref 1.5–4.5)
Glucose: 108 mg/dL — ABNORMAL HIGH (ref 70–99)
Potassium: 4.2 mmol/L (ref 3.5–5.2)
Sodium: 136 mmol/L (ref 134–144)
Total Protein: 7.1 g/dL (ref 6.0–8.5)
eGFR: 73 mL/min/{1.73_m2} (ref >59)

## 2022-04-21 LAB — CBC WITH DIFFERENTIAL/PLATELET
Basophils Absolute: 0 10*3/uL (ref 0.0–0.2)
Basos: 1 %
EOS (ABSOLUTE): 0.1 10*3/uL (ref 0.0–0.4)
Eos: 3 %
Hematocrit: 36.9 % (ref 34.0–46.6)
Hemoglobin: 12.1 g/dL (ref 11.1–15.9)
Immature Grans (Abs): 0 10*3/uL (ref 0.0–0.1)
Immature Granulocytes: 0 %
Lymphocytes Absolute: 1.4 10*3/uL (ref 0.7–3.1)
Lymphs: 26 %
MCH: 28.5 pg (ref 26.6–33.0)
MCHC: 32.8 g/dL (ref 31.5–35.7)
MCV: 87 fL (ref 79–97)
Monocytes Absolute: 0.5 10*3/uL (ref 0.1–0.9)
Monocytes: 9 %
Neutrophils Absolute: 3.2 10*3/uL (ref 1.4–7.0)
Neutrophils: 61 %
Platelets: 204 10*3/uL (ref 150–450)
RBC: 4.24 x10E6/uL (ref 3.77–5.28)
RDW: 14.1 % (ref 11.7–15.4)
WBC: 5.2 10*3/uL (ref 3.4–10.8)

## 2022-04-21 LAB — LIPID PANEL
Chol/HDL Ratio: 2.8 ratio (ref 0.0–4.4)
Cholesterol, Total: 113 mg/dL (ref 100–199)
HDL: 40 mg/dL (ref >39)
LDL Chol Calc (NIH): 51 mg/dL (ref 0–99)
Triglycerides: 121 mg/dL (ref 0–149)
VLDL Cholesterol Cal: 22 mg/dL (ref 5–40)

## 2022-04-23 ENCOUNTER — Other Ambulatory Visit: Payer: Self-pay | Admitting: Family Medicine

## 2022-04-23 DIAGNOSIS — J189 Pneumonia, unspecified organism: Secondary | ICD-10-CM

## 2022-04-23 MED ORDER — AMOXICILLIN-POT CLAVULANATE 875-125 MG PO TABS: 1.0000 | ORAL_TABLET | Freq: Two times a day (BID) | ORAL | 0 refills | Status: DC

## 2022-04-23 NOTE — Progress Notes (Signed)
Patient notified

## 2022-05-01 ENCOUNTER — Ambulatory Visit (INDEPENDENT_AMBULATORY_CARE_PROVIDER_SITE_OTHER): Payer: Medicare Other | Admitting: Family Medicine

## 2022-05-01 ENCOUNTER — Encounter: Payer: Self-pay | Admitting: Family Medicine

## 2022-05-01 VITALS — BP 158/65 | HR 60 | Temp 97.6°F | Ht 67.0 in | Wt 175.2 lb

## 2022-05-01 DIAGNOSIS — J189 Pneumonia, unspecified organism: Secondary | ICD-10-CM | POA: Diagnosis not present

## 2022-05-01 MED ORDER — BETAMETHASONE SOD PHOS & ACET 6 (3-3) MG/ML IJ SUSP
6.0000 mg | Freq: Once | INTRAMUSCULAR | Status: AC
  Administered 2022-05-01: 6 mg via INTRAMUSCULAR

## 2022-05-01 NOTE — Progress Notes (Signed)
Subjective:  Patient ID: [name], female    DOB: 1946-08-09  Age: 76 y.o. MRN: [id number]  CC: Wheezing   HPI ALILA SOTERO presents for nervous about husband's staph sepsis for which he is currently hospitalized. She has been dxed with pneumonia via CXR. She is taking augmentin and using an albuterol inhaler. . Denies fever. With each time she inhales she wheezes. Tired from going to the hospital to check on her husband.  COugh is mild Talking causes her to cough. Feels herself wheeing. Has dyspnea with walking a lot.      05/01/2022    2:18 PM 04/20/2022   11:01 AM 02/23/2022    1:41 PM  Depression screen PHQ 2/9  Decreased Interest 1 1 1   Down, Depressed, Hopeless 1 1 1   PHQ - 2 Score 2 2 2   Altered sleeping 1 1 1   Tired, decreased energy 1 1 1   Change in appetite 1 1 1   Feeling bad or failure about yourself  1 1 1   Trouble concentrating 1 1 1   Moving slowly or fidgety/restless 1 1 1   Suicidal thoughts 1 1 1   PHQ-9 Score 9 9 9   Difficult doing work/chores  Not difficult at all     History Ifeoluwa has a past medical history of AAA (abdominal aortic aneurysm) ([place]), Acute diverticulitis, Anxiety, Arthritis, Atrial fibrillation ([place]), CAD (coronary artery disease), Cancer ([place]), Diabetes mellitus, Diverticulitis, Diverticulitis of colon ( ), Dysrhythmia, Foot deformity (1947), Hemangioma, HLD (hyperlipidemia), HTN (hypertension), Osteoporosis, Tobacco abuse, Ureteral obstruction, and Wears dentures.   She has a past surgical history that includes Tonsillectomy and adenoidectomy; Knee arthroscopy; Tubal ligation; Right leg surgery (age 32); Ureteral surgery (2011); Coronary stent placement (2012); Cardiac catheterization (2012); Breast lumpectomy with needle localization and axillary sentinel lymph node bx (Right, 03/01/2014); Colonoscopy with propofol (N/A, 06/29/2014); Esophagogastroduodenoscopy (egd) with propofol (N/A, 11/01/2015); biopsy (N/A, 11/01/2015); Breast surgery; Colon  resection (N/A, 07/25/2018); Partial colectomy (N/A, 07/25/2018); laparotomy (N/A, 07/25/2018); and Total knee arthroplasty (Left, 01/01/2022).   Her family history includes Cancer in her brother and father; Diabetes in her brother; Heart disease in her mother; Heart failure (age of onset: 19) in her mother; Hypertension in her brother; Lung cancer (age of onset: 55) in her father.She reports that she quit smoking about 11 years ago. Her smoking use included cigarettes. She has a 100.00 pack-year smoking history. She has never used smokeless tobacco. She reports that she does not drink alcohol and does not use drugs.    ROS Review of Systems  Constitutional: Negative.   HENT: Negative.    Eyes:  Negative for visual disturbance.  Respiratory:  Positive for shortness of breath and wheezing.   Cardiovascular:  Negative for chest pain.  Gastrointestinal:  Negative for abdominal pain.  Musculoskeletal:  Negative for arthralgias.   Objective:  BP (!) 158/65   Pulse 60   Temp 97.6 F (36.4 C)   Ht 5\' 7"  (1.702 m)   Wt 175 lb 3.2 oz (79.5 kg)   SpO2 94%   BMI 27.44 kg/m   BP Readings from Last 3 Encounters:  05/01/22 (!) 158/65  04/20/22 (!) 144/60  02/23/22 137/70    Wt Readings from Last 3 Encounters:  05/01/22 175 lb 3.2 oz (79.5 kg)  04/20/22 178 lb (80.7 kg)  02/23/22 175 lb (79.4 kg)     Physical Exam Constitutional:      General: She is not in acute distress.    Appearance: She is well-developed.  HENT:     Head: Normocephalic and atraumatic.  Eyes:     Conjunctiva/sclera: Conjunctivae normal.     Pupils: Pupils are equal, round, and reactive to light.  Neck:     Thyroid: No thyromegaly.  Cardiovascular:     Rate and Rhythm: Normal rate and regular rhythm.     Heart sounds: Normal heart sounds. No murmur heard. Pulmonary:     Effort: Pulmonary effort is normal. No respiratory distress.     Breath sounds: Normal breath sounds. No wheezing or rales.  Abdominal:      General: Bowel sounds are normal. There is no distension.     Palpations: Abdomen is soft.     Tenderness: There is no abdominal tenderness.  Musculoskeletal:        General: Normal range of motion.     Cervical back: Normal range of motion and neck supple.  Lymphadenopathy:     Cervical: No cervical adenopathy.  Skin:    General: Skin is warm and dry.  Neurological:     Mental Status: She is alert and oriented to person, place, and time.  Psychiatric:        Behavior: Behavior normal.        Thought Content: Thought content normal.        Judgment: Judgment normal.      Assessment & Plan:   Nieves was seen today for wheezing.  Diagnoses and all orders for this visit:  Pneumonia of right lung due to infectious organism, unspecified part of lung -     DG Chest 2 View; Future -     betamethasone acetate-betamethasone sodium phosphate (CELESTONE) injection 6 mg       I am having Kaziyah C. Tauzin maintain her hydroxychloroquine, magnesium oxide, cholecalciferol, atorvastatin, potassium chloride, lisinopril, amLODipine, meclizine, dofetilide, ALPRAZolam, metoprolol succinate, warfarin, albuterol, and amoxicillin-clavulanate. We administered betamethasone acetate-betamethasone sodium phosphate.  Allergies as of 05/01/2022       Reactions   Miralax [polyethylene Glycol] Swelling, Rash   Took [facility] brand developed rash. Patient states she tolerated name brand MiraLax in the past. 09/01/15. Patient states she had diffuse swelling including swelling of her lips.    Vancomycin Rash, Shortness Of Breath   Acetaminophen Hives   Banana Other (See Comments)   Upset stomach   Oxycodone-acetaminophen Itching   Sulfa Antibiotics Rash   All over rash   Sulfacetamide Sodium Rash   All over rash   Sulfasalazine Rash   All over rash   Latex Rash   Tape Rash        Medication List        Accurate as of May 01, 2022  9:34 PM. If you have any questions, ask your nurse or doctor.           albuterol 108 (90 Base) MCG/ACT inhaler Commonly known as: VENTOLIN HFA Inhale 2 puffs into the lungs every 6 (six) hours as needed for wheezing or shortness of breath.   ALPRAZolam 0.25 MG tablet Commonly known as: XANAX Take 1 tablet (0.25 mg total) by mouth 3 (three) times daily as needed for anxiety.   amLODipine 5 MG tablet Commonly known as: NORVASC TAKE 1 TABLET(5 MG) BY MOUTH DAILY   amoxicillin-clavulanate 875-125 MG tablet Commonly known as: AUGMENTIN Take 1 tablet by mouth 2 (two) times daily.   atorvastatin 40 MG tablet Commonly known as: LIPITOR TAKE 1 AND 1/2 TABLETS BY MOUTH EVERY DAY What changed:  when to take this additional instructions  cholecalciferol 25 MCG (1000 UNIT) tablet Commonly known as: VITAMIN D3 Take 1,000 Units by mouth daily.   dofetilide 250 MCG capsule Commonly known as: TIKOSYN Take 1 capsule (250 mcg total) by mouth 2 (two) times daily.   hydroxychloroquine 200 MG tablet Commonly known as: PLAQUENIL Take 1 tablet (200 mg total) by mouth 2 (two) times daily.   lisinopril 20 MG tablet Commonly known as: ZESTRIL Take 1 tablet (20 mg total) by mouth daily.   magnesium oxide 400 MG tablet Commonly known as: MAG-OX Take 400 mg by mouth 2 (two) times daily.   meclizine 25 MG tablet Commonly known as: ANTIVERT Take 1 tablet (25 mg total) by mouth 3 (three) times daily as needed for dizziness.   metoprolol succinate 25 MG 24 hr tablet Commonly known as: TOPROL-XL Take one tablet daily   potassium chloride 10 MEQ tablet Commonly known as: KLOR-CON M TAKE 1 TABLET BY MOUTH DAILY   warfarin 5 MG tablet Commonly known as: COUMADIN Take as directed by the anticoagulation clinic. If you are unsure how to take this medication, talk to your nurse or doctor. Original instructions: TAKE 1 AND 1/2 TABLETS(7.5 MG) BY MOUTH DAILY         Follow-up: Return if symptoms worsen or fail to improve.  Claretta Fraise, M.D.

## 2022-05-02 ENCOUNTER — Other Ambulatory Visit (INDEPENDENT_AMBULATORY_CARE_PROVIDER_SITE_OTHER): Payer: Medicare Other

## 2022-05-02 DIAGNOSIS — J189 Pneumonia, unspecified organism: Secondary | ICD-10-CM | POA: Diagnosis not present

## 2022-05-02 DIAGNOSIS — R0602 Shortness of breath: Secondary | ICD-10-CM | POA: Diagnosis not present

## 2022-05-12 ENCOUNTER — Other Ambulatory Visit: Payer: Self-pay | Admitting: Family Medicine

## 2022-05-12 DIAGNOSIS — J40 Bronchitis, not specified as acute or chronic: Secondary | ICD-10-CM

## 2022-05-12 DIAGNOSIS — R062 Wheezing: Secondary | ICD-10-CM

## 2022-05-24 ENCOUNTER — Telehealth: Payer: Self-pay | Admitting: Family Medicine

## 2022-05-24 NOTE — Telephone Encounter (Signed)
Appt scheduled for 6/19 at 8:10am. Pt made aware. Offered later in the month appt but pt wished to be seen sooner to discuss anxiety since husband's passing.

## 2022-05-28 ENCOUNTER — Ambulatory Visit (INDEPENDENT_AMBULATORY_CARE_PROVIDER_SITE_OTHER): Payer: Medicare Other | Admitting: Family Medicine

## 2022-05-28 ENCOUNTER — Encounter: Payer: Self-pay | Admitting: Family Medicine

## 2022-05-28 VITALS — BP 128/58 | HR 60 | Temp 97.8°F | Ht 67.0 in | Wt 168.0 lb

## 2022-05-28 DIAGNOSIS — F32A Depression, unspecified: Secondary | ICD-10-CM

## 2022-05-28 DIAGNOSIS — F419 Anxiety disorder, unspecified: Secondary | ICD-10-CM

## 2022-05-28 DIAGNOSIS — I4819 Other persistent atrial fibrillation: Secondary | ICD-10-CM | POA: Diagnosis not present

## 2022-05-28 DIAGNOSIS — Z79899 Other long term (current) drug therapy: Secondary | ICD-10-CM | POA: Diagnosis not present

## 2022-05-28 DIAGNOSIS — F411 Generalized anxiety disorder: Secondary | ICD-10-CM | POA: Diagnosis not present

## 2022-05-28 LAB — COAGUCHEK XS/INR WAIVED
INR: 2.7 — ABNORMAL HIGH (ref 0.9–1.1)
Prothrombin Time: 32.6 s

## 2022-05-28 MED ORDER — ALPRAZOLAM 0.25 MG PO TABS: 0.2500 mg | ORAL_TABLET | Freq: Three times a day (TID) | ORAL | 2 refills | Status: DC | PRN

## 2022-05-28 NOTE — Progress Notes (Signed)
BP (!) 128/58   Pulse 60   Temp 97.8 F (36.6 C)   Ht 5' 7" (1.702 m)   Wt 168 lb (76.2 kg)   BMI 26.31 kg/m    Subjective:   Patient ID: Melody Casey, female    DOB: [birth year], 76 y.o.   MRN: [id number]  HPI: Melody Casey is a 76 y.o. female presenting on 05/28/2022 for Medical Management of Chronic Issues, Atrial Fibrillation, Anxiety, and Depression   HPI Coumadin recheck Target goal: 2.0-3.0 Reason on anticoagulation: Paroxysmal A-fib Patient denies any bruising or bleeding or chest pain or palpitations .  Anxiety and depression Patient is coming in to discuss anxiety and depression and for recheck.  Her husband recently passed unexpectedly and she has had a lot of stress anxiety related to this.   Current rx-alprazolam 0.25 3 times daily as needed # meds rx-30 Effectiveness of current meds-works well, except for she is dealing with the loss of her husband currently. Adverse reactions form meds-none  Pill count performed-No Last drug screen -01/25/2022 ( high risk q25m moderate risk q636mlow risk yearly ) Urine drug screen today- No Was the NCI[place]-yes  If yes were their any concerning findings? -None  No flowsheet data found.   Controlled substance contract signed on: 01/25/2022    05/01/2022    2:18 PM 04/20/2022   11:01 AM 02/23/2022    1:41 PM 02/23/2022    1:40 PM 11/29/2021   11:31 AM  Depression screen PHQ 2/9  Decreased Interest _0 0 0  Down, Depressed, Hopeless _1 0 0  PHQ - 2 Score _2 0 0  Altered sleeping _3 Tired, decreased energy _4 Change in appetite _5 Feeling bad or failure about yourself  _6 Trouble concentrating _7 Moving slowly or fidgety/restless _8 Suicidal thoughts _9 PHQ-9 Score _10 Difficult doing work/chores  Not difficult at all        Relevant past medical, surgical, family and social history reviewed and updated as indicated. Interim medical history since  our last visit reviewed. Allergies and medications reviewed and updated.  Review of Systems  Constitutional:  Negative for chills and fever.  Eyes:  Negative for visual disturbance.  Respiratory:  Negative for chest tightness and shortness of breath.   Cardiovascular:  Negative for chest pain and leg swelling.  Musculoskeletal:  Negative for back pain and gait problem.  Skin:  Negative for rash.  Neurological:  Negative for dizziness, light-headedness and headaches.  Psychiatric/Behavioral:  Negative for agitation and behavioral problems.   All other systems reviewed and are negative.   Per HPI unless specifically indicated above   Allergies as of 05/28/2022       Reactions   Miralax [polyethylene Glycol] Swelling, Rash   Took [facility] brand developed rash. Patient states she tolerated name brand MiraLax in the past. 09/01/15. Patient states she had diffuse swelling including swelling of her lips.    Vancomycin Rash, Shortness Of Breath   Acetaminophen Hives   Banana Other (See Comments)   Upset stomach   Oxycodone-acetaminophen Itching   Sulfa Antibiotics Rash   All over rash   Sulfacetamide Sodium Rash   All over rash   Sulfasalazine Rash   All over rash  Latex Rash   Tape Rash        Medication List        Accurate as of May 28, 2022  9:04 AM. If you have any questions, ask your nurse or doctor.          STOP taking these medications    albuterol 108 (90 Base) MCG/ACT inhaler Commonly known as: VENTOLIN HFA Stopped by: [name] Dettinger, MD   amoxicillin-clavulanate 875-125 MG tablet Commonly known as: AUGMENTIN Stopped by: [name] Dettinger, MD   meclizine 25 MG tablet Commonly known as: ANTIVERT Stopped by: Worthy Rancher, MD       TAKE these medications    ALPRAZolam 0.25 MG tablet Commonly known as: XANAX Take 1 tablet (0.25 mg total) by mouth 3 (three) times daily as needed for anxiety. Please put prescription on file and cancel any old  prescriptions. What changed: additional instructions Changed by: [name] Dettinger, MD   amLODipine 5 MG tablet Commonly known as: NORVASC TAKE 1 TABLET(5 MG) BY MOUTH DAILY   atorvastatin 40 MG tablet Commonly known as: LIPITOR TAKE 1 AND 1/2 TABLETS BY MOUTH EVERY DAY What changed:  when to take this additional instructions   cholecalciferol 25 MCG (1000 UNIT) tablet Commonly known as: VITAMIN D3 Take 1,000 Units by mouth daily.   dofetilide 250 MCG capsule Commonly known as: TIKOSYN Take 1 capsule (250 mcg total) by mouth 2 (two) times daily.   hydroxychloroquine 200 MG tablet Commonly known as: PLAQUENIL Take 1 tablet (200 mg total) by mouth 2 (two) times daily.   lisinopril 20 MG tablet Commonly known as: ZESTRIL Take 1 tablet (20 mg total) by mouth daily.   magnesium oxide 400 MG tablet Commonly known as: MAG-OX Take 400 mg by mouth 2 (two) times daily.   metoprolol succinate 25 MG 24 hr tablet Commonly known as: TOPROL-XL Take one tablet daily   potassium chloride 10 MEQ tablet Commonly known as: KLOR-CON M TAKE 1 TABLET BY MOUTH DAILY   warfarin 5 MG tablet Commonly known as: COUMADIN Take as directed by the anticoagulation clinic. If you are unsure how to take this medication, talk to your nurse or doctor. Original instructions: TAKE 1 AND 1/2 TABLETS(7.5 MG) BY MOUTH DAILY         Objective:   BP (!) 128/58   Pulse 60   Temp 97.8 F (36.6 C)   Ht 5' 7" (1.702 m)   Wt 168 lb (76.2 kg)   BMI 26.31 kg/m   Wt Readings from Last 3 Encounters:  05/28/22 168 lb (76.2 kg)  05/01/22 175 lb 3.2 oz (79.5 kg)  04/20/22 178 lb (80.7 kg)    Physical Exam Vitals and nursing note reviewed.  Constitutional:      General: She is not in acute distress.    Appearance: She is well-developed. She is not diaphoretic.  Eyes:     Conjunctiva/sclera: Conjunctivae normal.  Cardiovascular:     Rate and Rhythm: Normal rate and regular rhythm.     Heart  sounds: Normal heart sounds. No murmur heard. Pulmonary:     Effort: Pulmonary effort is normal. No respiratory distress.     Breath sounds: Normal breath sounds. No wheezing.  Musculoskeletal:        General: No swelling or tenderness. Normal range of motion.  Skin:    General: Skin is warm and dry.     Findings: No rash.  Neurological:     Mental Status: She  is alert and oriented to person, place, and time.     Coordination: Coordination normal.  Psychiatric:        Behavior: Behavior normal.     Results for orders placed or performed in visit on 04/20/22  CoaguChek XS/INR Waived  Result Value Ref Range   INR 2.6 (H) 0.9 - 1.1   Prothrombin Time 31.2 sec  Bayer DCA Hb A1c Waived  Result Value Ref Range   HB A1C (BAYER DCA - WAIVED) 6.4 (H) 4.8 - 5.6 %  CBC with Differential/Platelet  Result Value Ref Range   WBC 5.2 3.4 - 10.8 x10E3/uL   RBC 4.24 3.77 - 5.28 x10E6/uL   Hemoglobin 12.1 11.1 - 15.9 g/dL   Hematocrit 36.9 34.0 - 46.6 %   MCV 87 79 - 97 fL   MCH 28.5 26.6 - 33.0 pg   MCHC 32.8 31.5 - 35.7 g/dL   RDW 14.1 11.7 - 15.4 %   Platelets 204 150 - 450 x10E3/uL   Neutrophils 61 Not Estab. %   Lymphs 26 Not Estab. %   Monocytes 9 Not Estab. %   Eos 3 Not Estab. %   Basos 1 Not Estab. %   Neutrophils Absolute 3.2 1.4 - 7.0 x10E3/uL   Lymphocytes Absolute 1.4 0.7 - 3.1 x10E3/uL   Monocytes Absolute 0.5 0.1 - 0.9 x10E3/uL   EOS (ABSOLUTE) 0.1 0.0 - 0.4 x10E3/uL   Basophils Absolute 0.0 0.0 - 0.2 x10E3/uL   Immature Granulocytes 0 Not Estab. %   Immature Grans (Abs) 0.0 0.0 - 0.1 x10E3/uL  CMP14+EGFR  Result Value Ref Range   Glucose 108 (H) 70 - 99 mg/dL   BUN 14 8 - 27 mg/dL   Creatinine, Ser 0.82 0.57 - 1.00 mg/dL   eGFR 73 >59 mL/min/1.73   BUN/Creatinine Ratio 17 12 - 28   Sodium 136 134 - 144 mmol/L   Potassium 4.2 3.5 - 5.2 mmol/L   Chloride 102 96 - 106 mmol/L   CO2 20 20 - 29 mmol/L   Calcium 10.3 8.7 - 10.3 mg/dL   Total Protein 7.1 6.0 - 8.5  g/dL   Albumin 3.7 3.7 - 4.7 g/dL   Globulin, Total 3.4 1.5 - 4.5 g/dL   Albumin/Globulin Ratio 1.1 (L) 1.2 - 2.2   Bilirubin Total 0.3 0.0 - 1.2 mg/dL   Alkaline Phosphatase 119 44 - 121 IU/L   AST 15 0 - 40 IU/L   ALT 11 0 - 32 IU/L  Lipid panel  Result Value Ref Range   Cholesterol, Total 113 100 - 199 mg/dL   Triglycerides 121 0 - 149 mg/dL   HDL 40 >39 mg/dL   VLDL Cholesterol Cal 22 5 - 40 mg/dL   LDL Chol Calc (NIH) 51 0 - 99 mg/dL   Chol/HDL Ratio 2.8 0.0 - 4.4 ratio    Assessment & Plan:   Problem List Items Addressed This Visit       Cardiovascular and Mediastinum   Persistent atrial fibrillation (HCC) - Primary   Relevant Orders   CoaguChek XS/INR Waived     Other   Anxiety and depression   Relevant Medications   ALPRAZolam (XANAX) 0.25 MG tablet   High risk medication use   Other Visit Diagnoses     Anxiety state       Relevant Medications   ALPRAZolam (XANAX) 0.25 MG tablet       Continue current medicine, seems to be doing well.  She does not want anything  additional for anxiety depression at this point but is thinking about counseling versus speaking to somebody through her church.  She will let us now. Description   continue weekly dose to take 1/2 tablets or 7.5 mg on Mondays and Thursdays and take 5 mg all other days.   INR 2.7 today. Goal 2.0-3.0, improved, no change for now  Recheck in 6-8 weeks     Follow up plan: Return if symptoms worsen or fail to improve, for 4-6 weeks.  Counseling provided for all of the vaccine components Orders Placed This Encounter  Procedures   CoaguChek XS/INR [place] Marvin Grabill, MD [facility] 05/28/2022, 9:04 AM

## 2022-06-15 ENCOUNTER — Other Ambulatory Visit: Payer: Self-pay | Admitting: Cardiology

## 2022-06-25 ENCOUNTER — Encounter (HOSPITAL_BASED_OUTPATIENT_CLINIC_OR_DEPARTMENT_OTHER): Payer: Self-pay

## 2022-06-25 ENCOUNTER — Other Ambulatory Visit: Payer: Self-pay

## 2022-06-25 ENCOUNTER — Emergency Department (HOSPITAL_BASED_OUTPATIENT_CLINIC_OR_DEPARTMENT_OTHER): Payer: Medicare Other | Admitting: Radiology

## 2022-06-25 ENCOUNTER — Emergency Department (HOSPITAL_BASED_OUTPATIENT_CLINIC_OR_DEPARTMENT_OTHER)
Admission: EM | Admit: 2022-06-25 | Discharge: 2022-06-25 | Disposition: A | Discharge status: Home / Self Care | Payer: Medicare Other | Attending: Emergency Medicine | Admitting: Emergency Medicine

## 2022-06-25 DIAGNOSIS — R002 Palpitations: Secondary | ICD-10-CM | POA: Insufficient documentation

## 2022-06-25 DIAGNOSIS — J90 Pleural effusion, not elsewhere classified: Secondary | ICD-10-CM | POA: Diagnosis not present

## 2022-06-25 DIAGNOSIS — Z5321 Procedure and treatment not carried out due to patient leaving prior to being seen by health care provider: Secondary | ICD-10-CM | POA: Insufficient documentation

## 2022-06-25 DIAGNOSIS — I4891 Unspecified atrial fibrillation: Secondary | ICD-10-CM | POA: Diagnosis not present

## 2022-06-25 DIAGNOSIS — R0602 Shortness of breath: Secondary | ICD-10-CM | POA: Insufficient documentation

## 2022-06-25 LAB — CBC
HCT: 37.8 % (ref 36.0–46.0)
Hemoglobin: 12.3 g/dL (ref 12.0–15.0)
MCH: 28.1 pg (ref 26.0–34.0)
MCHC: 32.5 g/dL (ref 30.0–36.0)
MCV: 86.5 fL (ref 80.0–100.0)
Platelets: 212 10*3/uL (ref 150–400)
RBC: 4.37 MIL/uL (ref 3.87–5.11)
RDW: 15.1 % (ref 11.5–15.5)
WBC: 5.7 10*3/uL (ref 4.0–10.5)
nRBC: 0 % (ref 0.0–0.2)

## 2022-06-25 LAB — BASIC METABOLIC PANEL
Anion gap: 9 (ref 5–15)
BUN: 16 mg/dL (ref 8–23)
CO2: 23 mmol/L (ref 22–32)
Calcium: 10.5 mg/dL — ABNORMAL HIGH (ref 8.9–10.3)
Chloride: 106 mmol/L (ref 98–111)
Creatinine, Ser: 0.61 mg/dL (ref 0.44–1.00)
GFR, Estimated: >60 mL/min (ref >60)
Glucose, Bld: 159 mg/dL — ABNORMAL HIGH (ref 70–99)
Potassium: 3.7 mmol/L (ref 3.5–5.1)
Sodium: 138 mmol/L (ref 135–145)

## 2022-06-25 LAB — PROTIME-INR
INR: 2.8 — ABNORMAL HIGH (ref 0.8–1.2)
Prothrombin Time: 29 seconds — ABNORMAL HIGH (ref 11.4–15.2)

## 2022-06-25 LAB — TROPONIN I (HIGH SENSITIVITY): Troponin I (High Sensitivity): 12 ng/L (ref <18)

## 2022-06-25 NOTE — ED Triage Notes (Signed)
Patient here POV from Home with Neighbor.  Endorses being in Atrial Fibrillation over the Past Few Days. States she feels SOB and has had Palpitations.  Endorses No CP. Also endorses BP has been High more Recently.  NAD Noted during Triage. A&Ox4. GCS 15. BIB Wheelchair.

## 2022-06-25 NOTE — ED Notes (Signed)
Called pt twice with no response. Registration informed me that the pt left.

## 2022-06-26 ENCOUNTER — Encounter (HOSPITAL_BASED_OUTPATIENT_CLINIC_OR_DEPARTMENT_OTHER): Payer: Self-pay | Admitting: Family

## 2022-06-26 ENCOUNTER — Ambulatory Visit (INDEPENDENT_AMBULATORY_CARE_PROVIDER_SITE_OTHER): Payer: Medicare Other | Admitting: Family

## 2022-06-26 VITALS — BP 134/56 | HR 74 | Ht 67.0 in | Wt 170.5 lb

## 2022-06-26 DIAGNOSIS — J90 Pleural effusion, not elsewhere classified: Secondary | ICD-10-CM

## 2022-06-26 DIAGNOSIS — E782 Mixed hyperlipidemia: Secondary | ICD-10-CM | POA: Diagnosis not present

## 2022-06-26 DIAGNOSIS — I25118 Atherosclerotic heart disease of native coronary artery with other forms of angina pectoris: Secondary | ICD-10-CM | POA: Diagnosis not present

## 2022-06-26 DIAGNOSIS — I10 Essential (primary) hypertension: Secondary | ICD-10-CM

## 2022-06-26 DIAGNOSIS — F4321 Adjustment disorder with depressed mood: Secondary | ICD-10-CM | POA: Diagnosis not present

## 2022-06-26 DIAGNOSIS — E876 Hypokalemia: Secondary | ICD-10-CM

## 2022-06-26 MED ORDER — POTASSIUM CHLORIDE CRYS ER 10 MEQ PO TBCR: 10.0000 meq | EXTENDED_RELEASE_TABLET | Freq: Two times a day (BID) | ORAL | 3 refills | Status: DC

## 2022-06-26 MED ORDER — FUROSEMIDE 40 MG PO TABS: 40.0000 mg | ORAL_TABLET | Freq: Every day | ORAL | 2 refills | Status: DC

## 2022-06-26 NOTE — Patient Instructions (Addendum)
Medication Instructions:  Your physician has recommended you make the following change in your medication:   START Furosemide (Lasix) one 40mg  tablet daily  INCREASE your Potassium to one 10 mEq twice per day  *If you need a refill on your cardiac medications before your next appointment, please call your pharmacy*   Lab Work: Your physician recommends that you return for lab work in 1 week for BMP, magnesium  If you have labs (blood work) drawn today and your tests are completely normal, you will receive your results only by: [place] (if you have MyChart) OR A paper copy in the mail If you have any lab test that is abnormal or we need to change your treatment, we will call you to review the results.   Testing/Procedures: Your provider recommends a chest x-ray in one week takes a picture of the organs and structures inside the chest, including the heart, lungs, and blood vessels. This test can show several things, including, whether the heart is enlarges; whether fluid is building up in the lungs; and whether pacemaker / defibrillator leads are still in place.   Follow-Up: At [facility], you and your health needs are our priority.  As part of our continuing mission to provide you with exceptional heart care, we have created designated Provider Care Teams.  These Care Teams include your primary Cardiologist (physician) and Advanced Practice Providers (APPs -  Physician Assistants and Nurse Practitioners) who all work together to provide you with the care you need, when you need it.  We recommend signing up for the patient portal called "MyChart".  Sign up information is provided on this After Visit Summary.  MyChart is used to connect with patients for Virtual Visits (Telemedicine).  Patients are able to view lab/test results, encounter notes, upcoming appointments, etc.  Non-urgent messages can be sent to your provider as well.   To learn more about what you can do with MyChart,  go to [email or web address].    Your next appointment:   2 week(s)  The format for your next appointment:   In Person  Provider:   Minus Breeding, MD  or Loel Dubonnet, NP   Other Instructions  You have been referred to pulmonology.   You have been referred to psychology today. Mental health is an important part of heart health. You have been referred to Dr. Elias Else. He is located at [facility] at [facility]. They will contact you to schedule an appointment. If you have not heard from them in 1 week, you may call [phone] to schedule an appointment.     Office of Patient Experience at [phone].

## 2022-06-26 NOTE — Progress Notes (Unsigned)
Office Visit    Patient Name: Melody Casey Date of Encounter: 06/26/2022  PCP:  Dettinger, [name], MD   [place]  Cardiologist:  Minus Breeding, MD  Advanced Practice Provider:  No care team member to display Electrophysiologist:  None      Chief Complaint    Melody Casey is a 76 y.o. female with a hx of CAD, PAF presents today for palpitations, dyspnea  Past Medical History    Past Medical History:  Diagnosis Date   AAA (abdominal aortic aneurysm) ([place])    Acute diverticulitis    Anxiety    Arthritis    Atrial fibrillation ([place])    a. Dx 03/2011 - on tikosyn/coumadin.   CAD (coronary artery disease)    a. NSTEMI 02/2011: occ mid Cx, DES to OM2, residual nonobst LAD dz.   Cancer [facility])    breast   Diabetes mellitus    Diverticulitis    Diverticulitis of colon     2008, 04/2011, 12/2014, 08/2015   Dysrhythmia    Foot deformity 1947   right; ??scleroderma   Hemangioma    liver   HLD (hyperlipidemia)    HTN (hypertension)    Osteoporosis    Tobacco abuse    stopped smoking 2012   Ureteral obstruction    History of gross hematuria/right hydronephrosis 2/2 to uteropelvic junction obstruction, s/p cystoscopy in January 2007 with bilateral retrograde pyelography, right ureter arthroscopy, right ureteral stent placement, bladder biopsies, stent removal since then.   Wears dentures    top   Past Surgical History:  Procedure Laterality Date   BIOPSY N/A 11/01/2015   Procedure: BIOPSY;  Surgeon: Danie Binder, MD;  Location: [facility];  Service: Endoscopy;  Laterality: N/A;   BREAST LUMPECTOMY WITH NEEDLE LOCALIZATION AND AXILLARY SENTINEL LYMPH NODE BX Right 03/01/2014   Procedure: BREAST LUMPECTOMY WITH NEEDLE LOCALIZATION AND AXILLARY SENTINEL LYMPH NODE BX;  Surgeon: Edward Jolly, MD;  Location: [facility];  Service: General;  Laterality: Right;   [place]  2012   stent   COLON  RESECTION N/A 07/25/2018   Procedure: COLOSTOMY;  Surgeon: Rolm Bookbinder, MD;  Location: [facility];  Service: General;  Laterality: N/A;   COLONOSCOPY WITH PROPOFOL N/A 06/29/2014   Dr. Wynetta Emery: universal diverticulosis   CORONARY STENT PLACEMENT  2012   ESOPHAGOGASTRODUODENOSCOPY (EGD) WITH PROPOFOL N/A 11/01/2015   Procedure: ESOPHAGOGASTRODUODENOSCOPY (EGD) WITH PROPOFOL;  Surgeon: Danie Binder, MD;  Location: [facility];  Service: Endoscopy;  Laterality: N/A;   KNEE ARTHROSCOPY     left   LAPAROTOMY N/A 07/25/2018   Procedure: EXPLORATORY LAPAROTOMY;  Surgeon: Rolm Bookbinder, MD;  Location: [facility];  Service: General;  Laterality: N/A;   PARTIAL COLECTOMY N/A 07/25/2018   Procedure: COLECTOMY;  Surgeon: Rolm Bookbinder, MD;  Location: [facility];  Service: General;  Laterality: N/A;   Right leg surgery  age 15   As a child for  ?scleroderma per patient   TONSILLECTOMY AND ADENOIDECTOMY     TOTAL KNEE ARTHROPLASTY Left 01/01/2022   Procedure: LEFT TOTAL KNEE ARTHROPLASTY;  Surgeon: Frederik Pear, MD;  Location: [facility];  Service: Orthopedics;  Laterality: Left;   TUBAL LIGATION     Ureteral surgery  2011   rt ureterostomy-stent    Allergies  Allergies  Allergen Reactions   Miralax [Polyethylene Glycol] Swelling and Rash    Took [facility] brand developed rash. Patient states she tolerated  name brand MiraLax in the past.  09/01/15. Patient states she had diffuse swelling including swelling of her lips.    Vancomycin Rash and Shortness Of Breath   Acetaminophen Hives   Banana Other (See Comments)    Upset stomach   Oxycodone-Acetaminophen Itching   Sulfa Antibiotics Rash    All over rash   Sulfacetamide Sodium Rash    All over rash   Sulfasalazine Rash    All over rash    Latex Rash   Tape Rash    History of Present Illness    Melody Casey is a 76 y.o. female with a hx of CAD, PAF, HTN, HLDlast seen 12/21/21.  Coronary artery disease dates back to NSTEMI 02/2011 with occluded mid  Cx, DES to OM2, and residual nonobstructive LAD disease. Her atrial fibrillation has been managed with Tikosyn and Warfarin. She was last seen 12/2021 doing well and recommended for 1 year follow up.   Tells me for few days she felt she was having irregular heart beat. Noted irregular beat by her BP monitor at home. She did go to the ED yesterday but did not stay to be evaluated. CXR 06/26/22 with moderate to large right pleural effusion. Initial HS troponin 12. Lab work with Hb 12.3, Hct 37.8, wbc 5.7, Plt 212, K 3.7, creanine 0.61, Ca 10.5, Na 138, GFR >60. She also notes shortness of breath, inability to get a deep breath, and right chest wall discomfort. Shares with me that her husband passed away 6 weeks ago unexpectedly after a sudden 15 day hospital stay with sepsis. 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  EKGs/Labs/Other Studies Reviewed:   The following studies were reviewed today:  EKG:  EKG is ordered today.  The ekg ordered today demonstrates NSR 74 bpm with PVC, PAD and nonspecific ST/T wave changes.   Recent Labs: 04/20/2022: ALT 11 06/25/2022: BUN 16; Creatinine, Ser 0.61; Hemoglobin 12.3; Platelets 212; Potassium 3.7; Sodium 138  Recent Lipid Panel    Component Value Date/Time   CHOL 113 04/20/2022 1112   TRIG 121 04/20/2022 1112   HDL 40 04/20/2022 1112   CHOLHDL 2.8 04/20/2022 1112   CHOLHDL 6.2 02/14/2011 0330   VLDL 26 02/14/2011 0330   LDLCALC 51 04/20/2022 1112   LDLDIRECT 158.6 04/05/2008 0833    Risk Assessment/Calculations:   CHA2DS2-VASc Score = 8   This indicates a 10.8% annual risk of stroke. The patient's score is based upon: CHF History: 0 HTN History: 1 Diabetes History: 1 Stroke History: 2 Vascular Disease History: 1 Age Score: 2 Gender Score: 1   Home Medications   Current Meds  Medication Sig   ALPRAZolam (XANAX) 0.25 MG tablet Take 1 tablet (0.25 mg total) by mouth 3 (three) times daily as needed  for anxiety. Please put prescription on file and cancel any old prescriptions.   amLODipine (NORVASC) 5 MG tablet TAKE 1 TABLET(5 MG) BY MOUTH DAILY   atorvastatin (LIPITOR) 40 MG tablet TAKE 1 AND 1/2 TABLETS BY MOUTH EVERY DAY   cholecalciferol (VITAMIN D3) 25 MCG (1000 UNIT) tablet Take 1,000 Units by mouth daily.   dofetilide (TIKOSYN) 250 MCG capsule Take 1 capsule (250 mcg total) by mouth 2 (two) times daily.   lisinopril (ZESTRIL) 20 MG tablet Take 1 tablet (20 mg total) by mouth daily.   magnesium oxide (MAG-OX) 400 MG tablet Take 400 mg by mouth 2 (two) times daily.   metoprolol succinate (TOPROL-XL) 25  MG 24 hr tablet Take one tablet daily   potassium chloride (KLOR-CON) 10 MEQ tablet TAKE 1 TABLET BY MOUTH DAILY   warfarin (COUMADIN) 5 MG tablet TAKE 1 AND 1/2 TABLETS(7.5 MG) BY MOUTH DAILY     Review of Systems      All other systems reviewed and are otherwise negative except as noted above.  Physical Exam    VS:  BP (!) 134/56 (BP Location: Right Arm, Patient Position: Sitting, Cuff Size: Normal)   Pulse 74   Ht 5\' 7"  (1.702 m)   Wt 170 lb 8 oz (77.3 kg)   BMI 26.70 kg/m  , BMI Body mass index is 26.7 kg/m.  Wt Readings from Last 3 Encounters:  06/26/22 170 lb 8 oz (77.3 kg)  06/25/22 167 lb 15.9 oz (76.2 kg)  05/28/22 168 lb (76.2 kg)    GEN: Well nourished, well developed, in no acute distress. HEENT: normal. Neck: Supple, no JVD, carotid bruits, or masses. Cardiac: RRR, no murmurs, rubs, or gallops. No clubbing, cyanosis, edema.  Radials/PT 2+ and equal bilaterally.  Respiratory:  Respirations regular and unlabored, RLL diminished GI: Soft, nontender, nondistended. MS: No deformity or atrophy. Skin: Warm and dry, no rash. Neuro:  Strength and sensation are intact. Psych: Normal affect.  Assessment & Plan    Pleural effusion - Had PNA in May which resolved by CXR 05/02/22. However, she presented to ED yesterday with dyspnea, right chest wall discomfort. Did  not stay to be evaluated but CXR with moderate to large R sided pleural effusion. Discussed with Dr. Harriet Masson. Rx Lasix 40mg  QD, increase potassium to 63mEq BID. Repeat CXR and BMP/mag in 1 week. Urgent referral to pulmonology. This has been scheduled for 07/17/22.  If does not resolve with PO Lasix may need to consider thoracentesis.   PAF / Chronic anticoagulation / PAC / PVC - 06/25/22 Hb 12.3. EKG today occasional PVC/PAC - anticipate due to pleural effusion with management as above. Continue Toprol 25mg  QD, Tikosyn 250mcg BID. Of note, known interaction between Tikosyn and Plaquenil but has been on for >1 year without prolem with plan to continue monitoring with periodic EKG.  Brings documentation from her pharmacist about interaction between  Denies bleeding applications on warfarin.  Continue to follow with primary care clinic regarding warfarin levels. CHA2DS2-VASc Score = 8 [CHF History: 0, HTN History: 1, Diabetes History: 1, Stroke History: 2, Vascular Disease History: 1, Age Score: 2, Gender Score: 1].  Therefore, the patient's annual risk of stroke is 10.8 %.      Grief - Lost her husband unexpectedly 6 weeks ago. Offered my condolences. Referred to psychology Dr. Elias Else, PsyD.   CAD - Stable with no anginal symptoms. No indication for ischemic evaluation.    HTN - BP mildly elevated. Increase Lasix, as above.     HLD - 08/2021 LDL 73. 11/13/21 ALT 10, AST 15. Continue Atorvastatin.   Disposition: Follow up in 2 weeks with Minus Breeding, MD or APP.  Signed, Loel Dubonnet, NP 06/26/2022, 2:30 PM [place]

## 2022-06-27 ENCOUNTER — Encounter (HOSPITAL_BASED_OUTPATIENT_CLINIC_OR_DEPARTMENT_OTHER): Payer: Self-pay | Admitting: Family

## 2022-06-29 ENCOUNTER — Other Ambulatory Visit: Payer: Self-pay

## 2022-06-29 ENCOUNTER — Emergency Department (HOSPITAL_COMMUNITY): Payer: Medicare Other

## 2022-06-29 ENCOUNTER — Encounter (HOSPITAL_COMMUNITY): Payer: Self-pay

## 2022-06-29 ENCOUNTER — Inpatient Hospital Stay (HOSPITAL_COMMUNITY)
Admission: EM | Admit: 2022-06-29 | Discharge: 2022-07-09 | DRG: 167 | Disposition: A | Discharge status: Home Health | Payer: Medicare Other | Attending: Internal Medicine | Admitting: Internal Medicine

## 2022-06-29 DIAGNOSIS — I613 Nontraumatic intracerebral hemorrhage in brain stem: Secondary | ICD-10-CM | POA: Diagnosis not present

## 2022-06-29 DIAGNOSIS — Z8679 Personal history of other diseases of the circulatory system: Secondary | ICD-10-CM | POA: Diagnosis not present

## 2022-06-29 DIAGNOSIS — Z885 Allergy status to narcotic agent status: Secondary | ICD-10-CM

## 2022-06-29 DIAGNOSIS — Z91148 Patient's other noncompliance with medication regimen for other reason: Secondary | ICD-10-CM

## 2022-06-29 DIAGNOSIS — Z79899 Other long term (current) drug therapy: Secondary | ICD-10-CM

## 2022-06-29 DIAGNOSIS — C3400 Malignant neoplasm of unspecified main bronchus: Secondary | ICD-10-CM | POA: Diagnosis not present

## 2022-06-29 DIAGNOSIS — Z8739 Personal history of other diseases of the musculoskeletal system and connective tissue: Secondary | ICD-10-CM

## 2022-06-29 DIAGNOSIS — J90 Pleural effusion, not elsewhere classified: Secondary | ICD-10-CM | POA: Diagnosis present

## 2022-06-29 DIAGNOSIS — M810 Age-related osteoporosis without current pathological fracture: Secondary | ICD-10-CM | POA: Diagnosis present

## 2022-06-29 DIAGNOSIS — R091 Pleurisy: Secondary | ICD-10-CM | POA: Diagnosis not present

## 2022-06-29 DIAGNOSIS — J989 Respiratory disorder, unspecified: Secondary | ICD-10-CM | POA: Diagnosis not present

## 2022-06-29 DIAGNOSIS — R918 Other nonspecific abnormal finding of lung field: Secondary | ICD-10-CM

## 2022-06-29 DIAGNOSIS — Z933 Colostomy status: Secondary | ICD-10-CM

## 2022-06-29 DIAGNOSIS — Z9861 Coronary angioplasty status: Secondary | ICD-10-CM

## 2022-06-29 DIAGNOSIS — Z801 Family history of malignant neoplasm of trachea, bronchus and lung: Secondary | ICD-10-CM | POA: Diagnosis not present

## 2022-06-29 DIAGNOSIS — Z87891 Personal history of nicotine dependence: Secondary | ICD-10-CM | POA: Diagnosis not present

## 2022-06-29 DIAGNOSIS — E785 Hyperlipidemia, unspecified: Secondary | ICD-10-CM | POA: Diagnosis not present

## 2022-06-29 DIAGNOSIS — R9389 Abnormal findings on diagnostic imaging of other specified body structures: Secondary | ICD-10-CM | POA: Diagnosis not present

## 2022-06-29 DIAGNOSIS — Z7901 Long term (current) use of anticoagulants: Secondary | ICD-10-CM | POA: Diagnosis not present

## 2022-06-29 DIAGNOSIS — R54 Age-related physical debility: Secondary | ICD-10-CM | POA: Diagnosis present

## 2022-06-29 DIAGNOSIS — C771 Secondary and unspecified malignant neoplasm of intrathoracic lymph nodes: Secondary | ICD-10-CM | POA: Diagnosis present

## 2022-06-29 DIAGNOSIS — I4819 Other persistent atrial fibrillation: Secondary | ICD-10-CM | POA: Diagnosis present

## 2022-06-29 DIAGNOSIS — E875 Hyperkalemia: Secondary | ICD-10-CM | POA: Diagnosis not present

## 2022-06-29 DIAGNOSIS — I252 Old myocardial infarction: Secondary | ICD-10-CM | POA: Diagnosis not present

## 2022-06-29 DIAGNOSIS — M069 Rheumatoid arthritis, unspecified: Secondary | ICD-10-CM | POA: Diagnosis present

## 2022-06-29 DIAGNOSIS — Z882 Allergy status to sulfonamides status: Secondary | ICD-10-CM

## 2022-06-29 DIAGNOSIS — Z8249 Family history of ischemic heart disease and other diseases of the circulatory system: Secondary | ICD-10-CM

## 2022-06-29 DIAGNOSIS — R0600 Dyspnea, unspecified: Secondary | ICD-10-CM | POA: Diagnosis not present

## 2022-06-29 DIAGNOSIS — E118 Type 2 diabetes mellitus with unspecified complications: Secondary | ICD-10-CM | POA: Diagnosis present

## 2022-06-29 DIAGNOSIS — Z955 Presence of coronary angioplasty implant and graft: Secondary | ICD-10-CM

## 2022-06-29 DIAGNOSIS — R911 Solitary pulmonary nodule: Secondary | ICD-10-CM | POA: Diagnosis not present

## 2022-06-29 DIAGNOSIS — J439 Emphysema, unspecified: Secondary | ICD-10-CM | POA: Diagnosis not present

## 2022-06-29 DIAGNOSIS — Z888 Allergy status to other drugs, medicaments and biological substances status: Secondary | ICD-10-CM

## 2022-06-29 DIAGNOSIS — Z833 Family history of diabetes mellitus: Secondary | ICD-10-CM

## 2022-06-29 DIAGNOSIS — R846 Abnormal cytological findings in specimens from respiratory organs and thorax: Secondary | ICD-10-CM | POA: Diagnosis not present

## 2022-06-29 DIAGNOSIS — D62 Acute posthemorrhagic anemia: Secondary | ICD-10-CM | POA: Diagnosis not present

## 2022-06-29 DIAGNOSIS — I10 Essential (primary) hypertension: Secondary | ICD-10-CM | POA: Diagnosis not present

## 2022-06-29 DIAGNOSIS — I517 Cardiomegaly: Secondary | ICD-10-CM | POA: Diagnosis not present

## 2022-06-29 DIAGNOSIS — J9811 Atelectasis: Secondary | ICD-10-CM | POA: Diagnosis not present

## 2022-06-29 DIAGNOSIS — Z853 Personal history of malignant neoplasm of breast: Secondary | ICD-10-CM | POA: Diagnosis not present

## 2022-06-29 DIAGNOSIS — C3481 Malignant neoplasm of overlapping sites of right bronchus and lung: Secondary | ICD-10-CM | POA: Diagnosis not present

## 2022-06-29 DIAGNOSIS — I2510 Atherosclerotic heart disease of native coronary artery without angina pectoris: Secondary | ICD-10-CM

## 2022-06-29 DIAGNOSIS — Z96652 Presence of left artificial knee joint: Secondary | ICD-10-CM | POA: Diagnosis present

## 2022-06-29 DIAGNOSIS — M349 Systemic sclerosis, unspecified: Secondary | ICD-10-CM | POA: Diagnosis present

## 2022-06-29 DIAGNOSIS — R9082 White matter disease, unspecified: Secondary | ICD-10-CM | POA: Diagnosis not present

## 2022-06-29 DIAGNOSIS — Z91048 Other nonmedicinal substance allergy status: Secondary | ICD-10-CM

## 2022-06-29 DIAGNOSIS — R590 Localized enlarged lymph nodes: Secondary | ICD-10-CM | POA: Diagnosis present

## 2022-06-29 DIAGNOSIS — E876 Hypokalemia: Secondary | ICD-10-CM

## 2022-06-29 DIAGNOSIS — R0689 Other abnormalities of breathing: Secondary | ICD-10-CM | POA: Diagnosis not present

## 2022-06-29 DIAGNOSIS — C3411 Malignant neoplasm of upper lobe, right bronchus or lung: Principal | ICD-10-CM | POA: Diagnosis present

## 2022-06-29 DIAGNOSIS — R21 Rash and other nonspecific skin eruption: Secondary | ICD-10-CM | POA: Diagnosis present

## 2022-06-29 DIAGNOSIS — R0602 Shortness of breath: Secondary | ICD-10-CM | POA: Diagnosis not present

## 2022-06-29 DIAGNOSIS — K625 Hemorrhage of anus and rectum: Secondary | ICD-10-CM | POA: Diagnosis not present

## 2022-06-29 DIAGNOSIS — F419 Anxiety disorder, unspecified: Secondary | ICD-10-CM | POA: Diagnosis present

## 2022-06-29 DIAGNOSIS — Z91040 Latex allergy status: Secondary | ICD-10-CM

## 2022-06-29 LAB — BASIC METABOLIC PANEL
Anion gap: 7 (ref 5–15)
BUN: 15 mg/dL (ref 8–23)
CO2: 25 mmol/L (ref 22–32)
Calcium: 9.9 mg/dL (ref 8.9–10.3)
Chloride: 104 mmol/L (ref 98–111)
Creatinine, Ser: 0.64 mg/dL (ref 0.44–1.00)
GFR, Estimated: >60 mL/min (ref >60)
Glucose, Bld: 156 mg/dL — ABNORMAL HIGH (ref 70–99)
Potassium: 3.6 mmol/L (ref 3.5–5.1)
Sodium: 136 mmol/L (ref 135–145)

## 2022-06-29 LAB — CBC WITH DIFFERENTIAL/PLATELET
Abs Immature Granulocytes: 0.02 10*3/uL (ref 0.00–0.07)
Basophils Absolute: 0 10*3/uL (ref 0.0–0.1)
Basophils Relative: 1 %
Eosinophils Absolute: 0.1 10*3/uL (ref 0.0–0.5)
Eosinophils Relative: 1 %
HCT: 40.7 % (ref 36.0–46.0)
Hemoglobin: 13 g/dL (ref 12.0–15.0)
Immature Granulocytes: 0 %
Lymphocytes Relative: 14 %
Lymphs Abs: 0.7 10*3/uL (ref 0.7–4.0)
MCH: 28 pg (ref 26.0–34.0)
MCHC: 31.9 g/dL (ref 30.0–36.0)
MCV: 87.5 fL (ref 80.0–100.0)
Monocytes Absolute: 0.4 10*3/uL (ref 0.1–1.0)
Monocytes Relative: 9 %
Neutro Abs: 3.7 10*3/uL (ref 1.7–7.7)
Neutrophils Relative %: 75 %
Platelets: 234 10*3/uL (ref 150–400)
RBC: 4.65 MIL/uL (ref 3.87–5.11)
RDW: 15.1 % (ref 11.5–15.5)
WBC: 5 10*3/uL (ref 4.0–10.5)
nRBC: 0 % (ref 0.0–0.2)

## 2022-06-29 LAB — BRAIN NATRIURETIC PEPTIDE: B Natriuretic Peptide: 104 pg/mL — ABNORMAL HIGH (ref 0.0–100.0)

## 2022-06-29 LAB — PROTIME-INR
INR: 2.7 — ABNORMAL HIGH (ref 0.8–1.2)
Prothrombin Time: 28.2 seconds — ABNORMAL HIGH (ref 11.4–15.2)

## 2022-06-29 MED ORDER — LISINOPRIL 20 MG PO TABS
20.0000 mg | ORAL_TABLET | Freq: Every day | ORAL | Status: DC
  Administered 2022-06-30 – 2022-07-04 (×5): 20 mg via ORAL
  Filled 2022-06-29 (×5): qty 1

## 2022-06-29 MED ORDER — METOPROLOL SUCCINATE ER 25 MG PO TB24
25.0000 mg | ORAL_TABLET | Freq: Every day | ORAL | Status: DC
Start: 2022-06-30 — End: 2022-07-05
  Administered 2022-06-30 – 2022-07-04 (×5): 25 mg via ORAL
  Filled 2022-06-29 (×5): qty 1

## 2022-06-29 MED ORDER — WARFARIN SODIUM 5 MG PO TABS
5.0000 mg | ORAL_TABLET | Freq: Once | ORAL | Status: AC
Start: 2022-06-30 — End: 2022-06-30
  Administered 2022-06-30: 5 mg via ORAL
  Filled 2022-06-29: qty 1

## 2022-06-29 MED ORDER — ATORVASTATIN CALCIUM 40 MG PO TABS
60.0000 mg | ORAL_TABLET | Freq: Every day | ORAL | Status: DC
  Administered 2022-06-30 – 2022-07-09 (×10): 60 mg via ORAL
  Filled 2022-06-29 (×10): qty 1

## 2022-06-29 MED ORDER — POTASSIUM CHLORIDE CRYS ER 10 MEQ PO TBCR
10.0000 meq | EXTENDED_RELEASE_TABLET | Freq: Two times a day (BID) | ORAL | Status: DC
  Administered 2022-06-29 – 2022-06-30 (×2): 10 meq via ORAL
  Filled 2022-06-29 (×2): qty 1

## 2022-06-29 MED ORDER — WARFARIN - PHARMACIST DOSING INPATIENT: Freq: Every day | Status: DC

## 2022-06-29 MED ORDER — FUROSEMIDE 40 MG PO TABS
40.0000 mg | ORAL_TABLET | Freq: Every day | ORAL | Status: DC
  Administered 2022-07-02 – 2022-07-04 (×3): 40 mg via ORAL
  Filled 2022-06-29 (×5): qty 1

## 2022-06-29 MED ORDER — ALBUTEROL SULFATE (2.5 MG/3ML) 0.083% IN NEBU
2.5000 mg | INHALATION_SOLUTION | RESPIRATORY_TRACT | Status: DC | PRN
  Administered 2022-06-29 – 2022-07-09 (×5): 2.5 mg via RESPIRATORY_TRACT
  Filled 2022-06-29 (×5): qty 3

## 2022-06-29 MED ORDER — MAGNESIUM OXIDE -MG SUPPLEMENT 400 (240 MG) MG PO TABS
400.0000 mg | ORAL_TABLET | Freq: Two times a day (BID) | ORAL | Status: DC
  Administered 2022-06-29 – 2022-07-09 (×20): 400 mg via ORAL
  Filled 2022-06-29 (×20): qty 1

## 2022-06-29 MED ORDER — AMLODIPINE BESYLATE 5 MG PO TABS
5.0000 mg | ORAL_TABLET | Freq: Every day | ORAL | Status: DC
Start: 2022-06-30 — End: 2022-07-05
  Administered 2022-06-30 – 2022-07-04 (×5): 5 mg via ORAL
  Filled 2022-06-29 (×5): qty 1

## 2022-06-29 MED ORDER — DOFETILIDE 250 MCG PO CAPS
250.0000 ug | ORAL_CAPSULE | Freq: Two times a day (BID) | ORAL | Status: DC
  Administered 2022-06-29 – 2022-07-09 (×20): 250 ug via ORAL
  Filled 2022-06-29 (×21): qty 1

## 2022-06-29 MED ORDER — WARFARIN SODIUM 5 MG PO TABS
5.0000 mg | ORAL_TABLET | Freq: Once | ORAL | Status: DC
  Filled 2022-06-29: qty 1

## 2022-06-29 MED ORDER — ALPRAZOLAM 0.25 MG PO TABS
0.2500 mg | ORAL_TABLET | Freq: Three times a day (TID) | ORAL | Status: DC | PRN
  Administered 2022-06-29 – 2022-07-08 (×8): 0.25 mg via ORAL
  Filled 2022-06-29 (×8): qty 1

## 2022-06-29 NOTE — ED Triage Notes (Signed)
Called for SOB, issues since last Friday. Seen as [facility] on wed and had fluid on her right lung, given lasix and sent home. Pt reports not getting any better.  Also reports lower right sided abdomen pain.   BP 135/75 HR 94  O2 94 2L [place], RA 88% AxO x4 for EMS and able to walk   hx a fib, PNA back in May

## 2022-06-29 NOTE — ED Notes (Signed)
Attempted to call report-no answer 

## 2022-06-29 NOTE — H&P (Signed)
TRH H&P   Patient Demographics:    Melody Casey, is a 76 y.o. female  MRN: [id number]   DOB - [birth year]  Admit Date - 06/29/2022  Outpatient Primary MD for the patient is Dettinger, [name], MD  Referring MD/NP/PA: Dr Sabra Heck  Outpatient Specialists: cardiolgoy Dr Telford Nab    Patient coming from: Home  Chief Complaint  Patient presents with   Shortness of Breath      HPI:    Melody Casey  is a 76 y.o. female, with past medical history of A-fib, on Tikosyn and warfarin, hypertension, scleroderma, CAD status post PCI.  Diverticulitis/perforated sigmoid colon with colostomy, 2 diabetes mellitus, history of breast cancer. -ED secondary to shortness of breath, report it has been progressing over few weeks, but much worsened over last 5 days, reports she was recently diagnosed with a right pleural effusion by cardiology, she had referral done to pulmonary, but her appointment is not till August 8, patient report worsening dyspnea, like she is suffocating when she is laying down, she has been taking her Lasix without much improvement of her symptoms, she denies any chest pain, hemoptysis, worsening lower extremity edema, fever, chills or coughing, patient reports she was diagnosed with pneumonia 2 months ago for which it was treated with oral antibiotics, she did not require hospitalization for that. -In ED creatinine within normal range at 0.64, BNP mildly elevated at 104, had no leukocytosis, she was afebrile, her INR was therapeutic at 2.7, chest x-ray showing progression of right pleural effusion, [facility] hospitalist consulted to admit.    Review of systems:     A full 10 point Review of Systems was done, except as stated above, all other Review of Systems were negative.   With Past History of the following :    Past Medical History:  Diagnosis Date   AAA (abdominal aortic aneurysm)  ([place])    Acute diverticulitis    Anxiety    Arthritis    Atrial fibrillation ([place])    a. Dx 03/2011 - on tikosyn/coumadin.   CAD (coronary artery disease)    a. NSTEMI 02/2011: occ mid Cx, DES to OM2, residual nonobst LAD dz.   Cancer [facility])    breast   Diabetes mellitus    Diverticulitis    Diverticulitis of colon     2008, 04/2011, 12/2014, 08/2015   Dysrhythmia    Foot deformity 1947   right; ??scleroderma   Hemangioma    liver   HLD (hyperlipidemia)    HTN (hypertension)    Osteoporosis    Tobacco abuse    stopped smoking 2012   Ureteral obstruction    History of gross hematuria/right hydronephrosis 2/2 to uteropelvic junction obstruction, s/p cystoscopy in January 2007 with bilateral retrograde pyelography, right ureter arthroscopy, right ureteral stent placement, bladder biopsies, stent removal since then.   Wears dentures    top  Past Surgical History:  Procedure Laterality Date   BIOPSY N/A 11/01/2015   Procedure: BIOPSY;  Surgeon: Danie Binder, MD;  Location: [facility];  Service: Endoscopy;  Laterality: N/A;   BREAST LUMPECTOMY WITH NEEDLE LOCALIZATION AND AXILLARY SENTINEL LYMPH NODE BX Right 03/01/2014   Procedure: BREAST LUMPECTOMY WITH NEEDLE LOCALIZATION AND AXILLARY SENTINEL LYMPH NODE BX;  Surgeon: Edward Jolly, MD;  Location: [place];  Service: General;  Laterality: Right;   [place]  2012   stent   COLON RESECTION N/A 07/25/2018   Procedure: COLOSTOMY;  Surgeon: Rolm Bookbinder, MD;  Location: [place];  Service: General;  Laterality: N/A;   COLONOSCOPY WITH PROPOFOL N/A 06/29/2014   Dr. Wynetta Emery: universal diverticulosis   CORONARY STENT PLACEMENT  2012   ESOPHAGOGASTRODUODENOSCOPY (EGD) WITH PROPOFOL N/A 11/01/2015   Procedure: ESOPHAGOGASTRODUODENOSCOPY (EGD) WITH PROPOFOL;  Surgeon: Danie Binder, MD;  Location: [facility];  Service: Endoscopy;  Laterality: N/A;   KNEE ARTHROSCOPY     left    LAPAROTOMY N/A 07/25/2018   Procedure: EXPLORATORY LAPAROTOMY;  Surgeon: Rolm Bookbinder, MD;  Location: [place];  Service: General;  Laterality: N/A;   PARTIAL COLECTOMY N/A 07/25/2018   Procedure: COLECTOMY;  Surgeon: Rolm Bookbinder, MD;  Location: [place];  Service: General;  Laterality: N/A;   Right leg surgery  age 4   As a child for  ?scleroderma per patient   TONSILLECTOMY AND ADENOIDECTOMY     TOTAL KNEE ARTHROPLASTY Left 01/01/2022   Procedure: LEFT TOTAL KNEE ARTHROPLASTY;  Surgeon: Frederik Pear, MD;  Location: [facility];  Service: Orthopedics;  Laterality: Left;   TUBAL LIGATION     Ureteral surgery  2011   rt ureterostomy-stent      Social History:     Social History   Tobacco Use   Smoking status: Former    Packs/day: 2.00    Years: 50.00    Total pack years: 100.00    Types: Cigarettes    Quit date: 02/08/2011    Years since quitting: 11.3   Smokeless tobacco: Never  Substance Use Topics   Alcohol use: No    Alcohol/week: 0.0 standard drinks of alcohol        Family History :     Family History  Problem Relation Age of Onset   Lung cancer Father 26       deceased   Cancer Father    Heart failure Mother 62       deceased   Heart disease Mother    Diabetes Brother    Hypertension Brother    Cancer Brother        prostate   Colon cancer Neg Hx    Liver disease Neg Hx       Home Medications:   Prior to Admission medications   Medication Sig Start Date End Date Taking? Authorizing Provider  ALPRAZolam (XANAX) 0.25 MG tablet Take 1 tablet (0.25 mg total) by mouth 3 (three) times daily as needed for anxiety. Please put prescription on file and cancel any old prescriptions. 05/28/22  Yes Dettinger, [name], MD  amLODipine (NORVASC) 5 MG tablet TAKE 1 TABLET(5 MG) BY MOUTH DAILY Patient taking differently: Take 5 mg by mouth daily. 10/30/21  Yes Minus Breeding, MD  atorvastatin (LIPITOR) 40 MG tablet TAKE 1 AND 1/2 TABLETS BY MOUTH EVERY DAY 06/15/22  Yes  Minus Breeding, MD  dofetilide (TIKOSYN) 250 MCG capsule Take 1 capsule (250 mcg total)  by mouth 2 (two) times daily. 12/22/21  Yes Loel Dubonnet, NP  furosemide (LASIX) 40 MG tablet Take 1 tablet (40 mg total) by mouth daily. 06/26/22 09/24/22 Yes Loel Dubonnet, NP  lisinopril (ZESTRIL) 20 MG tablet Take 1 tablet (20 mg total) by mouth daily. 10/04/21  Yes Dettinger, [name], MD  magnesium oxide (MAG-OX) 400 MG tablet Take 400 mg by mouth 2 (two) times daily.   Yes [provider]  metoprolol succinate (TOPROL-XL) 25 MG 24 hr tablet Take one tablet daily 02/23/22  Yes Dettinger, [name], MD  potassium chloride (KLOR-CON M) 10 MEQ tablet Take 1 tablet (10 mEq total) by mouth 2 (two) times daily. 06/26/22  Yes Loel Dubonnet, NP  warfarin (COUMADIN) 5 MG tablet TAKE 1 AND 1/2 TABLETS(7.5 MG) BY MOUTH DAILY Patient taking differently: Take 5 mg by mouth See admin instructions. TAKE 1 AND 1/2 TABLETS(7.5 MG) BY MOUTH on Monday and Thursday all other days take 5 mg 02/23/22  Yes Dettinger, [name], MD  hydroxychloroquine (PLAQUENIL) 200 MG tablet Take 1 tablet (200 mg total) by mouth 2 (two) times daily. Patient not taking: Reported on 06/26/2022 08/22/18   Cathlyn Parsons, PA-C     Allergies:     Allergies  Allergen Reactions   Miralax [Polyethylene Glycol] Swelling and Rash    Took [facility] brand developed rash. Patient states she tolerated name brand MiraLax in the past.  09/01/15. Patient states she had diffuse swelling including swelling of her lips.    Vancomycin Rash and Shortness Of Breath   Acetaminophen Hives   Banana Other (See Comments)    Upset stomach   Oxycodone-Acetaminophen Itching   Sulfa Antibiotics Rash    All over rash   Sulfacetamide Sodium Rash    All over rash   Sulfasalazine Rash    All over rash    Latex Rash   Oxycodone-Acetaminophen Rash   Sulfacetamide Sodium Rash   Sulfacetamide Sodium-Sulfur Rash   Tape Rash     Physical Exam:    Vitals  Blood pressure (!) 141/63, pulse 64, temperature 97.8 F (36.6 C), resp. rate 16, height 5\' 7"  (1.702 m), weight 77.3 kg, SpO2 95 %.   1. General developed female, frail, sitting in bed in no apparent distress  2. Normal affect and insight, Not Suicidal or Homicidal, Awake Alert, Oriented X 3.  3. No F.N deficits, ALL C.Nerves Intact, Strength 5/5 all 4 extremities, Sensation intact all 4 extremities, Plantars down going.  4. Ears and Eyes appear Normal, Conjunctivae clear, PERRLA. Moist Oral Mucosa.  5. Supple Neck, No JVD, No cervical lymphadenopathy appriciated, No Carotid Bruits.  6. Symmetrical Chest wall movement, air entry in the right lung, no use of accessory muscles  7.  Irregular, No Gallops, Rubs or Murmurs, No Parasternal Heave.  8. Positive Bowel Sounds, Abdomen Soft, No tenderness, No organomegaly appriciated,No rebound -guarding or rigidity.  colostomy present  9.  No Cyanosis, Normal Skin Turgor, No Skin Rash or Bruise.  10.  Right lower extremity chronic atrophy, due to scleroderma, mild left lower extremity edema  11. No Palpable Lymph Nodes in Neck or Axillae    Data Review:    CBC Recent Labs  Lab 06/25/22 1848 06/29/22 1514  WBC 5.7 5.0  HGB 12.3 13.0  HCT 37.8 40.7  PLT 212 234  MCV 86.5 87.5  MCH 28.1 28.0  MCHC 32.5 31.9  RDW 15.1 15.1  LYMPHSABS  --  0.7  MONOABS  --  0.4  EOSABS  --  0.1  BASOSABS  --  0.0   ------------------------------------------------------------------------------------------------------------------  Chemistries  Recent Labs  Lab 06/25/22 1848 06/29/22 1514  NA 138 136  K 3.7 3.6  CL 106 104  CO2 23 25  GLUCOSE 159* 156*  BUN 16 15  CREATININE 0.61 0.64  CALCIUM 10.5* 9.9   ------------------------------------------------------------------------------------------------------------------ estimated creatinine clearance is 62.1 mL/min (by C-G formula based on SCr of 0.64  mg/dL). ------------------------------------------------------------------------------------------------------------------ No results for input(s): "TSH", "T4TOTAL", "T3FREE", "THYROIDAB" in the last 72 hours.  Invalid input(s): "FREET3"  Coagulation profile Recent Labs  Lab 06/25/22 1848 06/29/22 1514  INR 2.8* 2.7*   ------------------------------------------------------------------------------------------------------------------- No results for input(s): "DDIMER" in the last 72 hours. -------------------------------------------------------------------------------------------------------------------  Cardiac Enzymes No results for input(s): "CKMB", "TROPONINI", "MYOGLOBIN" in the last 168 hours.  Invalid input(s): "CK" ------------------------------------------------------------------------------------------------------------------    Component Value Date/Time   BNP 104.0 (H) 06/29/2022 1522     ---------------------------------------------------------------------------------------------------------------  Urinalysis    Component Value Date/Time   COLORURINE YELLOW 09/01/2015 1800   APPEARANCEUR Cloudy (A) 01/05/2020 1114   LABSPEC 1.025 09/01/2015 1800   PHURINE 6.0 09/01/2015 1800   GLUCOSEU Negative 01/05/2020 1114   GLUCOSEU NEGATIVE 04/05/2008 0833   HGBUR SMALL (A) 09/01/2015 1800   BILIRUBINUR Negative 01/05/2020 1114   KETONESUR NEGATIVE 09/01/2015 1800   PROTEINUR Negative 01/05/2020 1114   PROTEINUR TRACE (A) 09/01/2015 1800   UROBILINOGEN negative 10/25/2015 0926   UROBILINOGEN 0.2 09/01/2015 1800   NITRITE Positive (A) 01/05/2020 1114   NITRITE NEGATIVE 09/01/2015 1800   LEUKOCYTESUR 3+ (A) 01/05/2020 1114    ----------------------------------------------------------------------------------------------------------------   Imaging Results:    DG Chest 2 View  Result Date: 06/29/2022 CLINICAL DATA:  Dyspnea, pleural effusion EXAM: CHEST - 2 VIEW  COMPARISON:  Radiographs 06/25/2022 FINDINGS: Since 06/25/2022, slightly increased moderate-large right pleural effusion and associated atelectasis. Remainder unchanged. Left basilar atelectasis. Chronic interstitial coarsening. Aortic calcification. Unchanged cardiomediastinal silhouette. No acute osseous abnormality. IMPRESSION: Increased moderate-large right pleural effusion. Electronically Signed   By: Placido Sou M.D.   On: 06/29/2022 15:36    My personal review of EKG: Rhythm A fib, Rate  84 /min, QTc 491   Assessment & Plan:    Principal Problem:   Pleural effusion Active Problems:   Dyslipidemia, goal LDL below 70   Essential hypertension   Type 2 diabetes mellitus with complication, without long-term current use of insulin (HCC)   CAD S/P percutaneous coronary angioplasty   History of scleroderma   Persistent atrial fibrillation (HCC)   Rheumatoid arthritis (HCC)   Colostomy present (HCC)   S/P total knee arthroplasty, left   Right Pleural effusion -Progressive over last few days, no clear etiology, but she had pneumonia treated 92-month ago, so likely parapneumonic, likely related to volume overload, no signs of CHF, BNP minimally elevated at 104. -Asymptomatic, dyspneic, saturating 90% on room air, currently on 2 L oxygen -She will need thoracentesis, cannot be done at [facility] over the weekend, so she will be transferred to [facility] to be performed by IR. -Okay to continue warfarin, no need to reverse INR as discussed with IR, can be done with her INR of 2.7 -Check pleural fluid labs including LDH, protein, cytology and cell count, Gram stain and cultures -will Check cytology given history of breast cancer in the past, in remission -Likely related to CHF given no significant  Chronic A-fib with RVR. -Heart rate controlled, continue with metoprolol, Tikosyn. -on warfarin for anticoagulation, but  will hold this evening dose to prevent INR going above  2.7 tomorrow, can resume warfarin tomorrow  Hypertension/CAD -Continue with home medications  Hyperlipidemia -Continue with statin Type 2 diabetes mellitus -We will keep an insulin sliding scale  History of scleroderma -Does not appear to be taking her hydroxy chloroquine anymore as she has not been to her physician for refill.   DVT Prophylaxis on warfarin   AM Labs Ordered, also please review Full Orders  Family Communication: Admission, patients condition and plan of care including tests being ordered have been discussed with the patient who indicate understanding and agree with the plan and Code Status.  Code Status Full  Likely DC to  home  Condition GUARDED    Consults called: none    Admission status:observation    Time spent in minutes : 70 minutes   Phillips Climes M.D on 06/29/2022 at 5:21 PM   [facility] - Office  [phone]

## 2022-06-29 NOTE — ED Provider Notes (Signed)
[facility] EMERGENCY DEPARTMENT Provider Note   CSN: [id number] Arrival date & time: 06/29/22  1429     History  Chief Complaint  Patient presents with   Shortness of Breath    Melody Casey is a 76 y.o. female.   Shortness of Breath   This patient is a 76 year old female with a known history of hypertension on Norvasc, atrial fibrillation on Tikosyn as well as warfarin, history of hypertension on metoprolol and lisinopril as well as a history of scleroderma taking hydroxychloroquine.  She was recently diagnosed with a pleural effusion on the right side, this was seen at her emergency department visit approximately 4 days ago.  She followed up with the cardiology service in the last few days, prescribed furosemide, and was referred to pulmonology but cannot see them until August 8.  She is feeling more short of breath and feels like she is suffocating when she lays down.  She was given furosemide and has been taking it but without any significant improvement in her symptoms.  She denies any significant leg swelling, abdominal pain, headache, fever and is not coughing.  She does correlate having a recent infection in the last couple of months of a pneumonia, she was not hospitalized during that time  Home Medications Prior to Admission medications   Medication Sig Start Date End Date Taking? Authorizing Provider  ALPRAZolam (XANAX) 0.25 MG tablet Take 1 tablet (0.25 mg total) by mouth 3 (three) times daily as needed for anxiety. Please put prescription on file and cancel any old prescriptions. 05/28/22   Dettinger, [name], MD  amLODipine (NORVASC) 5 MG tablet TAKE 1 TABLET(5 MG) BY MOUTH DAILY 10/30/21   Minus Breeding, MD  atorvastatin (LIPITOR) 40 MG tablet TAKE 1 AND 1/2 TABLETS BY MOUTH EVERY DAY 06/15/22   Minus Breeding, MD  cholecalciferol (VITAMIN D3) 25 MCG (1000 UNIT) tablet Take 1,000 Units by mouth daily.    [provider]  dofetilide (TIKOSYN) 250 MCG capsule  Take 1 capsule (250 mcg total) by mouth 2 (two) times daily. 12/22/21   Loel Dubonnet, NP  furosemide (LASIX) 40 MG tablet Take 1 tablet (40 mg total) by mouth daily. 06/26/22 09/24/22  Loel Dubonnet, NP  hydroxychloroquine (PLAQUENIL) 200 MG tablet Take 1 tablet (200 mg total) by mouth 2 (two) times daily. Patient not taking: Reported on 06/26/2022 08/22/18   Angiulli, Lavon Paganini, PA-C  lisinopril (ZESTRIL) 20 MG tablet Take 1 tablet (20 mg total) by mouth daily. 10/04/21   Dettinger, [name], MD  magnesium oxide (MAG-OX) 400 MG tablet Take 400 mg by mouth 2 (two) times daily.    [provider]  metoprolol succinate (TOPROL-XL) 25 MG 24 hr tablet Take one tablet daily 02/23/22   Dettinger, [name], MD  potassium chloride (KLOR-CON M) 10 MEQ tablet Take 1 tablet (10 mEq total) by mouth 2 (two) times daily. 06/26/22   Loel Dubonnet, NP  warfarin (COUMADIN) 5 MG tablet TAKE 1 AND 1/2 TABLETS(7.5 MG) BY MOUTH DAILY 02/23/22   Dettinger, [name], MD      Allergies    Miralax [polyethylene glycol], Vancomycin, Acetaminophen, Banana, Oxycodone-acetaminophen, Sulfa antibiotics, Sulfacetamide sodium, Sulfasalazine, Latex, Oxycodone-acetaminophen, Sulfacetamide sodium, Sulfacetamide sodium-sulfur, and Tape    Review of Systems   Review of Systems  Respiratory:  Positive for shortness of breath.   All other systems reviewed and are negative.   Physical Exam Updated Vital Signs BP (!) 141/63   Pulse 64  Temp 97.8 F (36.6 C)   Resp 16   Ht 1.702 m (5\' 7" )   Wt 77.3 kg   SpO2 95%   BMI 26.70 kg/m  Physical Exam Vitals and nursing note reviewed.  Constitutional:      General: She is not in acute distress.    Appearance: She is well-developed.  HENT:     Head: Normocephalic and atraumatic.     Mouth/Throat:     Pharynx: No oropharyngeal exudate.  Eyes:     General: No scleral icterus.       Right eye: No discharge.        Left eye: No discharge.     Conjunctiva/sclera:  Conjunctivae normal.     Pupils: Pupils are equal, round, and reactive to light.  Neck:     Thyroid: No thyromegaly.     Vascular: No JVD.  Cardiovascular:     Rate and Rhythm: Normal rate. Rhythm irregular.     Heart sounds: Normal heart sounds. No murmur heard.    No friction rub. No gallop.  Pulmonary:     Effort: Respiratory distress present.     Breath sounds: No wheezing or rales.     Comments: There is dullness to percussion on the right hemithorax two thirds of the way up the back, there is no lung sounds below that area, normal lung sounds at the apex and in the left lung.  Mild tachypnea Abdominal:     General: Bowel sounds are normal. There is no distension.     Palpations: Abdomen is soft. There is no mass.     Tenderness: There is no abdominal tenderness.  Musculoskeletal:        General: No tenderness. Normal range of motion.     Cervical back: Normal range of motion and neck supple.     Right lower leg: No edema.     Left lower leg: No edema.  Lymphadenopathy:     Cervical: No cervical adenopathy.  Skin:    General: Skin is warm and dry.     Findings: No erythema or rash.  Neurological:     Mental Status: She is alert.     Coordination: Coordination normal.  Psychiatric:        Behavior: Behavior normal.     ED Results / Procedures / Treatments   Labs (all labs ordered are listed, but only abnormal results are displayed) Labs Reviewed  BASIC METABOLIC PANEL - Abnormal; Notable for the following components:      Result Value   Glucose, Bld 156 (*)    All other components within normal limits  BRAIN NATRIURETIC PEPTIDE - Abnormal; Notable for the following components:   B Natriuretic Peptide 104.0 (*)    All other components within normal limits  PROTIME-INR - Abnormal; Notable for the following components:   Prothrombin Time 28.2 (*)    INR 2.7 (*)    All other components within normal limits  CBC WITH DIFFERENTIAL/PLATELET    EKG EKG  Interpretation  Date/Time:  Friday June 29 2022 14:49:58 EDT Ventricular Rate:  84 PR Interval:    QRS Duration: 97 QT Interval:  415 QTC Calculation: 491 R Axis:   87 Text Interpretation: Atrial fibrillation vs nsr with ectopy Ventricular premature complex Anterior infarct, old ST depr, consider ischemia, inferior leads Confirmed by Noemi Chapel [id number]) on 06/29/2022 3:31:08 PM  Radiology DG Chest 2 View  Result Date: 06/29/2022 CLINICAL DATA:  Dyspnea, pleural effusion EXAM: CHEST - 2 VIEW  COMPARISON:  Radiographs 06/25/2022 FINDINGS: Since 06/25/2022, slightly increased moderate-large right pleural effusion and associated atelectasis. Remainder unchanged. Left basilar atelectasis. Chronic interstitial coarsening. Aortic calcification. Unchanged cardiomediastinal silhouette. No acute osseous abnormality. IMPRESSION: Increased moderate-large right pleural effusion. Electronically Signed   By: Placido Sou M.D.   On: 06/29/2022 15:36    Procedures Procedures    Medications Ordered in ED Medications - No data to display  ED Course/ Medical Decision Making/ A&P                           Medical Decision Making Amount and/or Complexity of Data Reviewed Labs: ordered. Radiology: ordered. ECG/medicine tests: ordered.  Risk Decision regarding hospitalization.     This patient presents to the ED for concern of SOB, this involves an extensive number of treatment options, and is a complaint that carries with it a high risk of complications and morbidity.  The differential diagnosis includes This patient appears to have an irregularly irregular rhythm consistent with atrial fibrillation or some ectopy, she has a known effusion which I suspect is getting larger based on her symptomatology.  Her respirations are 22/min and oxygen is 92 to 93% in the semirecumbent position.  She is requiring 2 L of oxygen for symptomatic relief and at this point does not appear to need advanced airway  management however she likely has a worsening effusion and may need thoracentesis.  She is on Coumadin, will check INR and labs as well as a repeat x-ray and an EKG.  The patient is agreeable.`   Co morbidities that complicate the patient evaluation  Atrial fibrillation Known pleural effusion Hypertension and anticoagulation   Additional history obtained:  Additional history obtained from electronic medical record External records from outside source obtained and reviewed including prior notes from cardiology visits and emergency department visits and prior x-rays   Lab Tests:  I Ordered, and personally interpreted labs.  The pertinent results include: CBC metabolic panel and a BNP   Imaging Studies ordered:  I ordered imaging studies including 2 view chest x-ray I independently visualized and interpreted imaging which showed a larger pleural effusion on the right side enlarging from last imaging I agree with the radiologist interpretation   Cardiac Monitoring: / EKG:  The patient was maintained on a cardiac monitor.  I personally viewed and interpreted the cardiac monitored which showed an underlying rhythm of: Sinus rhythm, slight irregularity   Consultations Obtained:  I requested consultation with the pulmonology Dr. Melvyn Novas,  and discussed lab and imaging findings as well as pertinent plan - they recommend: The patient needs a thoracentesis, discussed with radiology onsite who states that they cannot perform a thoracentesis here at this hour giving late on a Friday afternoon Discussed with hospitalist Dr. Waldron Labs who will assist with transferring patient to larger center for thoracentesis in interventional radiology   Problem List / ED Course / Critical interventions / Medication management  The patient is anticoagulated, needs an IR drainage procedure, not comfortable doing this at the bedside as this is not emergent Reevaluation of the patient after these medicines  showed that the patient stable I have reviewed the patients home medicines and have made adjustments as needed   Social Determinants of Health:  Anticoagulated   Test / Admission - Considered:  We will need to be admitted to the hospital Appreciate Dr. Graciela Husbands willingness to admit and transfer this patient        Final Clinical  Impression(s) / ED Diagnoses Final diagnoses:  Pleural effusion  Dyspnea, unspecified type    Rx / DC Orders ED Discharge Orders     None         Noemi Chapel, MD 06/29/22 1652

## 2022-06-29 NOTE — Progress Notes (Addendum)
ANTICOAGULATION CONSULT NOTE - Initial Consult  Pharmacy Consult for Warfarin  Indication: atrial fibrillation   Patient Measurements: Height: 5\' 7"  (170.2 cm) Weight: 77.3 kg (170 lb 8 oz) IBW/kg (Calculated) : 61.6 kg  Vital Signs: Temp: 97.8 F (36.6 C) (07/21 1454) BP: 141/63 (07/21 1630) Pulse Rate: 64 (07/21 1630)  Labs: Recent Labs    06/29/22 1514  HGB 13.0  HCT 40.7  PLT 234  LABPROT 28.2*  INR 2.7*  CREATININE 0.64    Estimated Creatinine Clearance: 62.1 mL/min (by C-G formula based on SCr of 0.64 mg/dL).   Medical History: Past Medical History:  Diagnosis Date   AAA (abdominal aortic aneurysm) ([place])    Acute diverticulitis    Anxiety    Arthritis    Atrial fibrillation ([place])    a. Dx 03/2011 - on tikosyn/coumadin.   CAD (coronary artery disease)    a. NSTEMI 02/2011: occ mid Cx, DES to OM2, residual nonobst LAD dz.   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   Wears dentures    top   Assessment: 52 YOF presenting with SOB after recent dx of pleural effusion. She is on warfarin PTA for atrial fibrillation. Pharmacy consulted to dose warfarin while inpatient. Per med rec, patient reports her last dose of warfarin was 7/20 @ 1900.   Plan for thoracentesis in the AM 7/22.   7/21: INR 2.7 (therapeutic), CBC stable/WNL   PTA regimen: 5 mg Sun/Tu/Wed/Fri/Sat; 7.5 mg Mon/Thur  Goal of Therapy:  INR  2-3 Monitor platelets by anticoagulation protocol: Yes   Plan:  Administer Warfarin 5 mg PO x1 per PTA regimen (first dose 7/22) Monitor daily INR, CBC, and signs/symptoms of bleeding   Adria Dill, PharmD PGY-2 Infectious Diseases Resident  06/29/2022 5:27 PM

## 2022-06-30 ENCOUNTER — Observation Stay (HOSPITAL_COMMUNITY): Payer: Medicare Other

## 2022-06-30 DIAGNOSIS — R846 Abnormal cytological findings in specimens from respiratory organs and thorax: Secondary | ICD-10-CM | POA: Diagnosis not present

## 2022-06-30 DIAGNOSIS — I517 Cardiomegaly: Secondary | ICD-10-CM | POA: Diagnosis not present

## 2022-06-30 DIAGNOSIS — Z853 Personal history of malignant neoplasm of breast: Secondary | ICD-10-CM | POA: Diagnosis not present

## 2022-06-30 DIAGNOSIS — J90 Pleural effusion, not elsewhere classified: Secondary | ICD-10-CM | POA: Diagnosis not present

## 2022-06-30 LAB — COMPREHENSIVE METABOLIC PANEL
ALT: 15 U/L (ref 0–44)
AST: 17 U/L (ref 15–41)
Albumin: 3 g/dL — ABNORMAL LOW (ref 3.5–5.0)
Alkaline Phosphatase: 76 U/L (ref 38–126)
Anion gap: 6 (ref 5–15)
BUN: 13 mg/dL (ref 8–23)
CO2: 26 mmol/L (ref 22–32)
Calcium: 9.9 mg/dL (ref 8.9–10.3)
Chloride: 106 mmol/L (ref 98–111)
Creatinine, Ser: 0.59 mg/dL (ref 0.44–1.00)
GFR, Estimated: >60 mL/min (ref >60)
Glucose, Bld: 114 mg/dL — ABNORMAL HIGH (ref 70–99)
Potassium: 3.5 mmol/L (ref 3.5–5.1)
Sodium: 138 mmol/L (ref 135–145)
Total Bilirubin: 0.7 mg/dL (ref 0.3–1.2)
Total Protein: 6.5 g/dL (ref 6.5–8.1)

## 2022-06-30 LAB — CBC
HCT: 38.6 % (ref 36.0–46.0)
Hemoglobin: 12.4 g/dL (ref 12.0–15.0)
MCH: 28.3 pg (ref 26.0–34.0)
MCHC: 32.1 g/dL (ref 30.0–36.0)
MCV: 88.1 fL (ref 80.0–100.0)
Platelets: 207 10*3/uL (ref 150–400)
RBC: 4.38 MIL/uL (ref 3.87–5.11)
RDW: 15 % (ref 11.5–15.5)
WBC: 4.7 10*3/uL (ref 4.0–10.5)
nRBC: 0 % (ref 0.0–0.2)

## 2022-06-30 LAB — PROTEIN, PLEURAL OR PERITONEAL FLUID: Total protein, fluid: 4.1 g/dL

## 2022-06-30 LAB — BODY FLUID CELL COUNT WITH DIFFERENTIAL
Lymphs, Fluid: 73 %
Monocyte-Macrophage-Serous Fluid: 24 % — ABNORMAL LOW (ref 50–90)
Neutrophil Count, Fluid: 3 % (ref 0–25)
Total Nucleated Cell Count, Fluid: 1698 cu mm — ABNORMAL HIGH (ref 0–1000)

## 2022-06-30 LAB — GLUCOSE, CAPILLARY
Glucose-Capillary: 114 mg/dL — ABNORMAL HIGH (ref 70–99)
Glucose-Capillary: 148 mg/dL — ABNORMAL HIGH (ref 70–99)
Glucose-Capillary: 131 mg/dL — ABNORMAL HIGH (ref 70–99)
Glucose-Capillary: 123 mg/dL — ABNORMAL HIGH (ref 70–99)

## 2022-06-30 LAB — PROTIME-INR
INR: 2.5 — ABNORMAL HIGH (ref 0.8–1.2)
Prothrombin Time: 26.9 seconds — ABNORMAL HIGH (ref 11.4–15.2)

## 2022-06-30 LAB — LACTATE DEHYDROGENASE, PLEURAL OR PERITONEAL FLUID: LD, Fluid: 175 U/L — ABNORMAL HIGH (ref 3–23)

## 2022-06-30 LAB — LACTATE DEHYDROGENASE: LDH: 130 U/L (ref 98–192)

## 2022-06-30 MED ORDER — ORAL CARE MOUTH RINSE
15.0000 mL | OROMUCOSAL | Status: DC | PRN
Start: 2022-06-30 — End: 2022-07-09

## 2022-06-30 MED ORDER — POTASSIUM CHLORIDE CRYS ER 10 MEQ PO TBCR
10.0000 meq | EXTENDED_RELEASE_TABLET | Freq: Two times a day (BID) | ORAL | Status: DC
  Administered 2022-07-01 – 2022-07-06 (×12): 10 meq via ORAL
  Filled 2022-06-30 (×12): qty 1

## 2022-06-30 MED ORDER — LIDOCAINE HCL 1 % IJ SOLN
INTRAMUSCULAR | Status: AC
  Administered 2022-06-30: 10 mL
  Filled 2022-06-30: qty 20

## 2022-06-30 MED ORDER — POTASSIUM CHLORIDE CRYS ER 20 MEQ PO TBCR
40.0000 meq | EXTENDED_RELEASE_TABLET | Freq: Once | ORAL | Status: AC
Start: 2022-06-30 — End: 2022-06-30
  Administered 2022-06-30: 40 meq via ORAL
  Filled 2022-06-30: qty 2

## 2022-06-30 MED ORDER — ALBUTEROL SULFATE (2.5 MG/3ML) 0.083% IN NEBU
2.5000 mg | INHALATION_SOLUTION | Freq: Three times a day (TID) | RESPIRATORY_TRACT | Status: DC
  Administered 2022-06-30 – 2022-07-03 (×12): 2.5 mg via RESPIRATORY_TRACT
  Filled 2022-06-30 (×13): qty 3

## 2022-06-30 NOTE — Evaluation (Signed)
Physical Therapy Evaluation Patient Details Name: Melody Casey MRN: [id number] DOB: [birth year] Today's Date: 06/30/2022  History of Present Illness  76 y.o. female admitted 06/29/22 with pleural effusion.  Past medical history: of A-fib, on Tikosyn and warfarin, hypertension, scleroderma, CAD status post PCI, Diverticulitis/perforated sigmoid colon with colostomy, diabetes mellitus, breast cancer, AAA, L TKA 01/01/22  Clinical Impression  Pt admitted with above diagnosis.  Pt currently with functional limitations due to the deficits listed below (see PT Problem List). Pt will benefit from skilled PT to increase their independence and safety with mobility to allow discharge to the venue listed below.  Pt reports she is awaiting word on a procedure for today.  Pt states she wants to d/c home as soon as possible.  Pt assisted with ambulating in hallway and maintained 4L O2 Melody Casey as pt was 88% sitting EOB however improved to 93-94% on 4L O2 with ambulating in hallway.  Pt reports she does not want to d/c home with oxygen.  Pt also states her husband passed away 6 weeks ago and daughter has been checking in.  Pt encouraged to mobilize as tolerated with staff during acute stay.        Recommendations for follow up therapy are one component of a multi-disciplinary discharge planning process, led by the attending physician.  Recommendations may be updated based on patient status, additional functional criteria and insurance authorization.  Follow Up Recommendations No PT follow up      Assistance Recommended at Discharge PRN  Patient can return home with the following  Help with stairs or ramp for entrance    Equipment Recommendations None recommended by PT  Recommendations for Other Services       Functional Status Assessment Patient has had a recent decline in their functional status and demonstrates the ability to make significant improvements in function in a reasonable and predictable amount of  time.     Precautions / Restrictions Precautions Precaution Comments: monitor O2      Mobility  Bed Mobility Overal bed mobility: Modified Independent                  Transfers Overall transfer level: Needs assistance Equipment used: Rolling walker (2 wheels) Transfers: Sit to/from Stand Sit to Stand: Min guard, Supervision           General transfer comment: SpO2 88% on 4L sitting EOB    Ambulation/Gait Ambulation/Gait assistance: Min guard, Supervision Gait Distance (Feet): 200 Feet Assistive device: Rolling walker (2 wheels) Gait Pattern/deviations: Step-through pattern, Decreased stride length       General Gait Details: utilized RW however pt not dependent on UEs, SpO2 93-94% on 4L O2 during ambulation; pt reports mild SOB however not any worse with ambulation vs at rest  Stairs            Wheelchair Mobility    Modified Rankin (Stroke Patients Only)       Balance Overall balance assessment: No apparent balance deficits (not formally assessed)                                           Pertinent Vitals/Pain Pain Assessment Pain Assessment: No/denies pain    Home Living Family/patient expects to be discharged to:: Private residence Living Arrangements: Alone Available Help at Discharge: Family;Available 24 hours/day (daughter) Type of Home: House Home Access: Stairs to enter Entrance Stairs-Rails:  Right;Left;Can reach both Entrance Stairs-Number of Steps: 3   Home Layout: One level Home Equipment: Conservation officer, nature (2 wheels);BSC/3in1;Cane - single point      Prior Function Prior Level of Function : Independent/Modified Independent                     Hand Dominance        Extremity/Trunk Assessment        Lower Extremity Assessment Lower Extremity Assessment: Overall WFL for tasks assessed;RLE deficits/detail RLE Deficits / Details: hx right foot deformity       Communication   Communication:  HOH  Cognition Arousal/Alertness: Awake/alert Behavior During Therapy: Flat affect Overall Cognitive Status: Within Functional Limits for tasks assessed                                          General Comments      Exercises     Assessment/Plan    PT Assessment Patient needs continued PT services  PT Problem List Decreased strength;Decreased activity tolerance;Decreased mobility;Cardiopulmonary status limiting activity       PT Treatment Interventions Gait training;DME instruction;Therapeutic exercise;Therapeutic activities;Functional mobility training;Patient/family education;Stair training    PT Goals (Current goals can be found in the Care Plan section)  Acute Rehab PT Goals PT Goal Formulation: With patient Time For Goal Achievement: 07/14/22 Potential to Achieve Goals: Good    Frequency Min 3X/week     Co-evaluation               AM-PAC PT "6 Clicks" Mobility  Outcome Measure Help needed turning from your back to your side while in a flat bed without using bedrails?: None Help needed moving from lying on your back to sitting on the side of a flat bed without using bedrails?: None Help needed moving to and from a bed to a chair (including a wheelchair)?: None Help needed standing up from a chair using your arms (e.g., wheelchair or bedside chair)?: A Little Help needed to walk in hospital room?: A Little Help needed climbing 3-5 steps with a railing? : A Little 6 Click Score: 21    End of Session Equipment Utilized During Treatment: Oxygen Activity Tolerance: Patient tolerated treatment well Patient left: in chair;with call bell/phone within reach;with chair alarm set Nurse Communication: Mobility status PT Visit Diagnosis: Difficulty in walking, not elsewhere classified (R26.2)    Time: 7062-3762 PT Time Calculation (min) (ACUTE ONLY): 13 min   Charges:   PT Evaluation $PT Eval Low Complexity: 1 Low         [name],  DPT Physical Therapist Acute Rehabilitation Services Preferred contact method: Secure Chat Weekend Pager Only: [phone] Office: [phone]   [name] 06/30/2022, 11:28 AM
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
## 2022-06-30 NOTE — Evaluation (Signed)
Occupational Therapy Evaluation Patient Details Name: Melody Casey MRN: [id number] DOB: Sep 05, 1946 Today's Date: 06/30/2022   History of Present Illness 76 y.o. female admitted 06/29/22 with pleural effusion. patient had thoracentesis on 7/22 with 1.3L collected.  Past medical history: of A-fib, on Tikosyn and warfarin, hypertension, scleroderma, CAD status post PCI, Diverticulitis/perforated sigmoid colon with colostomy, diabetes mellitus, breast cancer, AAA, L TKA 01/01/22   Clinical Impression   Patient is a 76 year old female who was admitted for above. Patient was noted to need min guard for mobility in room with continued need for deep breathing to maintain O2 saturations around 90%. Patient was noted to fatigue quickly during session but is highly motivated to transition home soon. Patient was noted to have decreased functional activity tolerance, decreased endurance, decreased standing balance, decreased safety awareness, and decreased knowledge of AD/AE impacting participation in ADLs. Patient would continue to benefit from skilled OT services at this time while admitted to address noted deficits in order to improve overall safety and independence in ADLs.       Recommendations for follow up therapy are one component of a multi-disciplinary discharge planning process, led by the attending physician.  Recommendations may be updated based on patient status, additional functional criteria and insurance authorization.   Follow Up Recommendations  No OT follow up    Assistance Recommended at Discharge Intermittent Supervision/Assistance  Patient can return home with the following Assistance with cooking/housework;Direct supervision/assist for financial management;Assist for transportation;Help with stairs or ramp for entrance;Direct supervision/assist for medications management    Functional Status Assessment  Patient has had a recent decline in their functional status and demonstrates the  ability to make significant improvements in function in a reasonable and predictable amount of time.  Equipment Recommendations  None recommended by OT    Recommendations for Other Services       Precautions / Restrictions Precautions Precaution Comments: monitor O2, colostomy Restrictions Weight Bearing Restrictions: No      Mobility Bed Mobility Overal bed mobility: Modified Independent                  Transfers                          Balance Overall balance assessment: Mild deficits observed, not formally tested                                         ADL either performed or assessed with clinical judgement   ADL Overall ADL's : Needs assistance/impaired Eating/Feeding: Set up;Sitting Eating/Feeding Details (indicate cue type and reason): EOB Grooming: Standing;Min guard Grooming Details (indicate cue type and reason): with patient noted to push RW away from her to approach sink with increased education provided. Upper Body Bathing: Min guard;Sitting   Lower Body Bathing: Minimal assistance;Sit to/from stand;Sitting/lateral leans   Upper Body Dressing : Set up;Sitting   Lower Body Dressing: Minimal assistance;Sit to/from stand Lower Body Dressing Details (indicate cue type and reason): simulated reaching in standing with min guard with no AD Toilet Transfer: Minimal assistance;Ambulation;Rolling walker (2 wheels) Toilet Transfer Details (indicate cue type and reason): O2 was noted to dip to 87% on RA breifly with education on deep breathing strategies patient was able to brin back up to 91% on RA. patient maintained 91% during session with noted cold digits. patient was educated on  pulse ox use. patient verbalized understanding reporting she had one at home. Toileting- Clothing Manipulation and Hygiene: Minimal assistance;Sit to/from stand       Functional mobility during ADLs: Min guard;Rolling walker (2 wheels)        Vision Patient Visual Report: No change from baseline       Perception     Praxis      Pertinent Vitals/Pain Pain Assessment Pain Assessment: No/denies pain     Hand Dominance     Extremity/Trunk Assessment Upper Extremity Assessment Upper Extremity Assessment: Overall WFL for tasks assessed   Lower Extremity Assessment Lower Extremity Assessment: Defer to PT evaluation RLE Deficits / Details: hx right foot deformity   Cervical / Trunk Assessment Cervical / Trunk Assessment: Normal   Communication Communication Communication: HOH   Cognition Arousal/Alertness: Awake/alert Behavior During Therapy: Flat affect Overall Cognitive Status: Within Functional Limits for tasks assessed                                       General Comments       Exercises     Shoulder Instructions      Home Living Family/patient expects to be discharged to:: Private residence Living Arrangements: Alone Available Help at Discharge: Family;Available 24 hours/day Type of Home: House Home Access: Stairs to enter [employer] of Steps: 3 Entrance Stairs-Rails: Right;Left;Can reach both Home Layout: One level               Home Equipment: Conservation officer, nature (2 wheels);BSC/3in1;Cane - single point          Prior Functioning/Environment Prior Level of Function : Independent/Modified Independent                        OT Problem List: Decreased activity tolerance;Impaired balance (sitting and/or standing);Decreased safety awareness;Cardiopulmonary status limiting activity;Decreased knowledge of precautions;Decreased knowledge of use of DME or AE      OT Treatment/Interventions: Self-care/ADL training;Therapeutic exercise;Neuromuscular education;Energy conservation;DME and/or AE instruction;Therapeutic activities;Balance training;Patient/family education    OT Goals(Current goals can be found in the care plan section) Acute Rehab OT  Goals Patient Stated Goal: to get out of here OT Goal Formulation: With patient Time For Goal Achievement: 07/14/22 Potential to Achieve Goals: Good  OT Frequency: Min 2X/week    Co-evaluation              AM-PAC OT "6 Clicks" Daily Activity     Outcome Measure Help from another person eating meals?: None Help from another person taking care of personal grooming?: None Help from another person toileting, which includes using toliet, bedpan, or urinal?: A Little Help from another person bathing (including washing, rinsing, drying)?: A Little Help from another person to put on and taking off regular upper body clothing?: A Little Help from another person to put on and taking off regular lower body clothing?: A Little 6 Click Score: 20   End of Session Equipment Utilized During Treatment: Rolling walker (2 wheels) Nurse Communication: Other (comment) (O2 during session and level of O2 at end of session)  Activity Tolerance: Patient tolerated treatment well Patient left: in bed;with call bell/phone within reach  OT Visit Diagnosis: Unsteadiness on feet (R26.81);Other abnormalities of gait and mobility (R26.89);Muscle weakness (generalized) (M62.81)                Time: 6967-8938 OT Time Calculation (min): 17 min  Charges:  OT General Charges $OT Visit: 1 Visit OT Evaluation $OT Eval Moderate Complexity: 1 Mod  Jackelyn Poling OTR/L, MS Acute Rehabilitation Department Office# [phone] Pager# [phone]   Marcellina Millin 06/30/2022, 4:09 PM

## 2022-06-30 NOTE — Plan of Care (Signed)

## 2022-06-30 NOTE — Progress Notes (Signed)
Notified on call provider that patient has had 200 mL of urine output since patient was admitted to the floor. Bladder scanned patient and had 259 mL in bladder. On call provider said to continue to monitor. Will pass along to dayshift nurse.

## 2022-06-30 NOTE — Procedures (Signed)
PROCEDURE SUMMARY:  Successful US guided right thoracentesis. Yielded 1.3L of red pleural fluid. Pt tolerated procedure well. No immediate complications.  Specimen was sent for labs. CXR ordered.  EBL < 5 mL  Damion Kant PA-C 06/30/2022 12:31 PM

## 2022-06-30 NOTE — Progress Notes (Addendum)
Melody Casey for Warfarin  Indication: atrial fibrillation   Patient Measurements: Height: 5\' 7"  (170.2 cm) Weight: 75.9 kg (167 lb 5.3 oz) IBW/kg (Calculated) : 61.6 kg  Vital Signs: Temp: 98 F (36.7 C) (07/22 0428) Temp Source: Oral (07/22 0428) BP: 140/90 (07/22 0428) Pulse Rate: 63 (07/22 0428)  Labs: Recent Labs    06/29/22 1514  HGB 13.0  HCT 40.7  PLT 234  LABPROT 28.2*  INR 2.7*  CREATININE 0.64     Estimated Creatinine Clearance: 61.6 mL/min (by C-G formula based on SCr of 0.64 mg/dL).  Assessment: 61 YOF presenting with SOB after recent dx of pleural effusion. She is on warfarin PTA for atrial fibrillation. Pharmacy consulted to dose warfarin while inpatient. Per med rec, patient reports her last dose of warfarin was 7/20 @ 1900.   Plan for thoracentesis in the AM 7/22.   PTA regimen: 5 mg Sun/Tu/Wed/Fri/Sat; 7.5 mg Mon/Thur  Today, 06/30/2022: Due to technical error, labs drawn this AM are not populating in Epic. Per lab hard copy from [employer] (will be placed in physical shadow chart): Hgb 12.4, Plt 207; both stable WNL INR 2.5; stable from yesterday No major drug-drug interactions identified PO intake not charted No signs of bleeding per RN Underwent thoracentesis without complications this AM yielding 1.3L fluid Pharmacist yesterday ordered first dose to start today (missed dose 7/21)  Goal of Therapy:  INR 2-3 Monitor platelets by anticoagulation protocol: Yes   Plan:  Proceed with warfarin 5 mg PO x 1 tonight as ordered yesterday Monitor daily INR, CBC, and signs/symptoms of bleeding    Reuel Boom, PharmD, BCPS [phone] 06/30/2022, 10:40 AM

## 2022-07-01 DIAGNOSIS — J90 Pleural effusion, not elsewhere classified: Secondary | ICD-10-CM | POA: Diagnosis not present

## 2022-07-01 LAB — COMPREHENSIVE METABOLIC PANEL
ALT: 13 U/L (ref 0–44)
AST: 16 U/L (ref 15–41)
Albumin: 2.8 g/dL — ABNORMAL LOW (ref 3.5–5.0)
Alkaline Phosphatase: 74 U/L (ref 38–126)
Anion gap: 5 (ref 5–15)
BUN: 14 mg/dL (ref 8–23)
CO2: 25 mmol/L (ref 22–32)
Calcium: 10.1 mg/dL (ref 8.9–10.3)
Chloride: 107 mmol/L (ref 98–111)
Creatinine, Ser: 0.62 mg/dL (ref 0.44–1.00)
GFR, Estimated: >60 mL/min (ref >60)
Glucose, Bld: 117 mg/dL — ABNORMAL HIGH (ref 70–99)
Potassium: 4.3 mmol/L (ref 3.5–5.1)
Sodium: 137 mmol/L (ref 135–145)
Total Bilirubin: 0.7 mg/dL (ref 0.3–1.2)
Total Protein: 6.4 g/dL — ABNORMAL LOW (ref 6.5–8.1)

## 2022-07-01 LAB — CBC
HCT: 36.6 % (ref 36.0–46.0)
Hemoglobin: 11.7 g/dL — ABNORMAL LOW (ref 12.0–15.0)
MCH: 28.3 pg (ref 26.0–34.0)
MCHC: 32 g/dL (ref 30.0–36.0)
MCV: 88.6 fL (ref 80.0–100.0)
Platelets: 204 10*3/uL (ref 150–400)
RBC: 4.13 MIL/uL (ref 3.87–5.11)
RDW: 15 % (ref 11.5–15.5)
WBC: 5.5 10*3/uL (ref 4.0–10.5)
nRBC: 0 % (ref 0.0–0.2)

## 2022-07-01 LAB — PROTIME-INR
INR: 2.5 — ABNORMAL HIGH (ref 0.8–1.2)
Prothrombin Time: 26.7 seconds — ABNORMAL HIGH (ref 11.4–15.2)

## 2022-07-01 LAB — GLUCOSE, CAPILLARY
Glucose-Capillary: 100 mg/dL — ABNORMAL HIGH (ref 70–99)
Glucose-Capillary: 124 mg/dL — ABNORMAL HIGH (ref 70–99)
Glucose-Capillary: 118 mg/dL — ABNORMAL HIGH (ref 70–99)
Glucose-Capillary: 124 mg/dL — ABNORMAL HIGH (ref 70–99)

## 2022-07-01 LAB — PROCALCITONIN: Procalcitonin: 0.17 ng/mL

## 2022-07-01 LAB — LACTATE DEHYDROGENASE: LDH: 133 U/L (ref 98–192)

## 2022-07-01 MED ORDER — CEFEPIME HCL 2 G IV SOLR
2.0000 g | Freq: Three times a day (TID) | INTRAVENOUS | Status: DC
  Administered 2022-07-01 – 2022-07-05 (×12): 2 g via INTRAVENOUS
  Filled 2022-07-01 (×12): qty 12.5

## 2022-07-01 MED ORDER — WARFARIN SODIUM 5 MG PO TABS
5.0000 mg | ORAL_TABLET | Freq: Once | ORAL | Status: AC
  Administered 2022-07-01: 5 mg via ORAL
  Filled 2022-07-01: qty 1

## 2022-07-01 NOTE — Plan of Care (Signed)
  Problem: Education: Goal: Knowledge of General Education information will improve Description: Including pain rating scale, medication(s)/side effects and non-pharmacologic comfort measures Outcome: Progressing   Problem: Clinical Measurements: Goal: Respiratory complications will improve Outcome: Progressing   Problem: Activity: Goal: Risk for activity intolerance will decrease Outcome: Progressing   Problem: Coping: Goal: Level of anxiety will decrease Outcome: Progressing   Problem: Pain Managment: Goal: General experience of comfort will improve Outcome: Progressing   Problem: Safety: Goal: Ability to remain free from injury will improve Outcome: Progressing   Problem: Skin Integrity: Goal: Risk for impaired skin integrity will decrease Outcome: Progressing

## 2022-07-01 NOTE — Progress Notes (Signed)
[place] for Warfarin  Indication: atrial fibrillation   Patient Measurements: Height: 5\' 7"  (170.2 cm) Weight: 75.9 kg (167 lb 5.3 oz) IBW/kg (Calculated) : 61.6 kg  Vital Signs: Temp: 98.2 F (36.8 C) (07/23 1224) Temp Source: Oral (07/23 1224) BP: 125/54 (07/23 1224) Pulse Rate: 59 (07/23 1224)  Labs: Recent Labs    06/29/22 1514 07/01/22 0406  HGB 13.0 11.7*  HCT 40.7 36.6  PLT 234 204  LABPROT 28.2* 26.7*  INR 2.7* 2.5*  CREATININE 0.64 0.62     Estimated Creatinine Clearance: 61.6 mL/min (by C-G formula based on SCr of 0.62 mg/dL).  Assessment: 23 YOF presenting with SOB after recent dx of pleural effusion. She is on warfarin PTA for atrial fibrillation. Pharmacy consulted to dose warfarin while inpatient. Per med rec, patient reports her last dose of warfarin was 7/20 @ 1900.   PTA regimen: 5 mg Sun/Tu/Wed/Fri/Sat; 7.5 mg Mon/Thur  Today, 07/01/2022: INR remains therapeutic Eating 100% of meals No signs of bleeding per RN No major drug interactions with warfarin identified  Goal of Therapy:  INR 2-3 Monitor platelets by anticoagulation protocol: Yes   Plan:  Warfarin 5 mg PO x 1 tonight Monitor daily INR, CBC, and signs/symptoms of bleeding    Reuel Boom, PharmD, BCPS [phone] 07/01/2022, 12:26 PM

## 2022-07-01 NOTE — Progress Notes (Signed)
Patient ambulatory along length of the unit, O2 sat 92% on room air at rest, would fluctuate between 84 and 92% while walking.  Patient with mild shortness of breath with ambulation.  Has remained off O2 all shift with sats in the low 90s.

## 2022-07-01 NOTE — Progress Notes (Signed)
PROGRESS NOTE    Melody Casey  QZR:[id number] DOB: 1946-09-12 DOA: 06/29/2022 PCP: Dettinger, [name], MD   Brief Narrative:  Melody Casey  is a 76 y.o. female, with past medical history of A-fib, on Tikosyn and warfarin, hypertension, scleroderma, CAD status post PCI.  Diverticulitis/perforated sigmoid colon with colostomy, 2 diabetes mellitus, history of breast cancer. -ED secondary to shortness of breath, report it has been progressing over few weeks, but much worsened over last 5 days, reports she was recently diagnosed with a right pleural effusion by cardiology, she had referral done to pulmonary, but her appointment is not till August 8, patient report worsening dyspnea, like she is suffocating when she is laying down, she has been taking her Lasix without much improvement of her symptoms, she denies any chest pain, hemoptysis, worsening lower extremity edema, fever, chills or coughing, patient reports she was diagnosed with pneumonia 2 months ago for which it was treated with oral antibiotics, she did not require hospitalization for that. -In ED creatinine within normal range at 0.64, BNP mildly elevated at 104, had no leukocytosis, she was afebrile, her INR was therapeutic at 2.7, chest x-ray showing progression of right pleural effusion, [facility] hospitalist consulted to admit.    Assessment & Plan:   Principal Problem:   Pleural effusion Active Problems:   Dyslipidemia, goal LDL below 70   Essential hypertension   Type 2 diabetes mellitus with complication, without long-term current use of insulin (HCC)   CAD S/P percutaneous coronary angioplasty   History of scleroderma   Persistent atrial fibrillation (HCC)   Rheumatoid arthritis (HCC)   Colostomy present (HCC)   S/P total knee arthroplasty, left  Right Pleural effusion -Progressive over last few days, no clear etiology, but she had pneumonia treated 62-month ago, so likely parapneumonic, likely related to volume overload, no  signs of CHF, BNP minimally elevated at 104. Due to need of thoracentesis, she was transferred to [facility].  Patient underwent thoracentesis on 06/30/2022.  Urinalysis indicates exudative with significant amount of neutrophils.  Interestingly, procalcitonin only 0.17.  She had pneumonia 2 months ago at the same location.  I will start her on antibiotics cefepime and follow culture.  Try to wean her off.  Continue albuterol.   Chronic A-fib with RVR. -Heart rate controlled, continue with metoprolol, Tikosyn. -on warfarin for anticoagulation, but was held due to pending thoracentesis, will resume tonight.   Hypertension/CAD/hyperlipidemia: Blood pressure controlled.  Continue home medications which include amlodipine, lisinopril, Toprol-XL and atorvastatin. -Continue with home medications   History of scleroderma -Does not appear to be taking her hydroxy chloroquine anymore as she has not been to her physician for refill.   DVT prophylaxis:   Resume Coumadin after thoracentesis.   Code Status: Full Code  Family Communication:  None present at bedside.  Plan of care discussed with patient in length and he/she verbalized understanding and agreed with it.  Status is: Observation The patient will require care spanning > 2 midnights and should be moved to inpatient because: Patient will need thoracentesis and repeat x-ray tomorrow morning.   Estimated body mass index is 26.21 kg/m as calculated from the following:   Height as of this encounter: 5\' 7"  (1.702 m).   Weight as of this encounter: 75.9 kg.    Nutritional Assessment: Body mass index is 26.21 kg/m.Marland Kitchen Seen by dietician.  I agree with the assessment and plan as outlined below: Nutrition Status:        . Skin  Assessment: I have examined the patient's skin and I agree with the wound assessment as performed by the wound care RN as outlined below:    Consultants:  IR  Procedures:  None  Antimicrobials:   Anti-infectives (From admission, onward)    Start     Dose/Rate Route Frequency Ordered Stop   07/01/22 1600  ceFEPIme (MAXIPIME) 1 g in sodium chloride 0.9 % 100 mL IVPB        1 g 200 mL/hr over 30 Minutes Intravenous Every 8 hours 07/01/22 1508           Subjective:  Patient seen and examined.  She still complains of shortness of breath and wheezing.  No new complaint.  She wants to get off of oxygen.  I have placed order to wean her off to room air.  Objective: Vitals:   07/01/22 1016 07/01/22 1224 07/01/22 1338 07/01/22 1356  BP:  (!) 125/54 128/64   Pulse:  (!) 59 62   Resp:  18 16   Temp:  98.2 F (36.8 C) (!) 97.5 F (36.4 C)   TempSrc:  Oral Oral   SpO2: 95% 91% 92% 90%  Weight:      Height:        Intake/Output Summary (Last 24 hours) at 07/01/2022 1508 Last data filed at 07/01/2022 9147 Gross per 24 hour  Intake 240 ml  Output 600 ml  Net -360 ml    Filed Weights   06/29/22 1454 06/30/22 0600  Weight: 77.3 kg 75.9 kg    Examination:  General exam: Appears calm and comfortable  Respiratory system: Minimal rhonchi in the left middle and lower lobe, right middle and lower lobe clear to auscultation.  Respiratory effort normal. Cardiovascular system: S1 & S2 heard, RRR. No JVD, murmurs, rubs, gallops or clicks. No pedal edema. Gastrointestinal system: Abdomen is nondistended, soft and nontender. No organomegaly or masses felt. Normal bowel sounds heard. Central nervous system: Alert and oriented. No focal neurological deficits. Extremities: Symmetric 5 x 5 power. Skin: No rashes, lesions or ulcers.  Psychiatry: Judgement and insight appear normal. Mood & affect appropriate.   Data Reviewed: I have personally reviewed following labs and imaging studies  CBC: Recent Labs  Lab 06/25/22 1848 06/29/22 1514 07/01/22 0406  WBC 5.7 5.0 5.5  NEUTROABS  --  3.7  --   HGB 12.3 13.0 11.7*  HCT 37.8 40.7 36.6  MCV 86.5 87.5 88.6  PLT 212 234 204     Basic Metabolic Panel: Recent Labs  Lab 06/25/22 1848 06/29/22 1514 07/01/22 0406  NA 138 136 137  K 3.7 3.6 4.3  CL 106 104 107  CO2 23 25 25   GLUCOSE 159* 156* 117*  BUN 16 15 14   CREATININE 0.61 0.64 0.62  CALCIUM 10.5* 9.9 10.1    GFR: Estimated Creatinine Clearance: 61.6 mL/min (by C-G formula based on SCr of 0.62 mg/dL). Liver Function Tests: Recent Labs  Lab 07/01/22 0406  AST 16  ALT 13  ALKPHOS 74  BILITOT 0.7  PROT 6.4*  ALBUMIN 2.8*   No results for input(s): "LIPASE", "AMYLASE" in the last 168 hours. No results for input(s): "AMMONIA" in the last 168 hours. Coagulation Profile: Recent Labs  Lab 06/25/22 1848 06/29/22 1514 07/01/22 0406  INR 2.8* 2.7* 2.5*    Cardiac Enzymes: No results for input(s): "CKTOTAL", "CKMB", "CKMBINDEX", "TROPONINI" in the last 168 hours. BNP (last 3 results) No results for input(s): "PROBNP" in the last 8760 hours. HbA1C: No results  for input(s): "HGBA1C" in the last 72 hours. CBG: Recent Labs  Lab 06/30/22 1125 06/30/22 1608 06/30/22 2150 07/01/22 0742 07/01/22 1151  GLUCAP 148* 131* 123* 100* 124*    Lipid Profile: No results for input(s): "CHOL", "HDL", "LDLCALC", "TRIG", "CHOLHDL", "LDLDIRECT" in the last 72 hours. Thyroid Function Tests: No results for input(s): "TSH", "T4TOTAL", "FREET4", "T3FREE", "THYROIDAB" in the last 72 hours. Anemia Panel: No results for input(s): "VITAMINB12", "FOLATE", "FERRITIN", "TIBC", "IRON", "RETICCTPCT" in the last 72 hours. Sepsis Labs: Recent Labs  Lab 07/01/22 0406  PROCALCITON 0.17    Recent Results (from the past 240 hour(s))  Body fluid culture w Gram Stain     Status: None (Preliminary result)   Collection Time: 06/30/22  2:00 PM   Specimen: PATH Cytology Pleural fluid  Result Value Ref Range Status   Specimen Description   Final    PLEURAL Performed at [facility] [address], [place], [place] [address]    Special Requests    Final    PLEURAL Performed at [facility], [place] [address], [place], [place] [address]    Gram Stain   Final    FEW WBC PRESENT, PREDOMINANTLY MONONUCLEAR NO ORGANISMS SEEN    Culture   Final    NO GROWTH < 24 HOURS Performed at [facility] [address] [place], [place] [address]    Report Status PENDING  Incomplete     Radiology Studies: US THORACENTESIS ASP PLEURAL SPACE W/IMG GUIDE  Result Date: 06/30/2022 INDICATION: Pleural effusion, history of breast cancer, shortness of breath EXAM: ULTRASOUND GUIDED RIGHT THORACENTESIS MEDICATIONS: None. COMPLICATIONS: None immediate. PROCEDURE: An ultrasound guided thoracentesis was thoroughly discussed with the patient and questions answered. The benefits, risks, alternatives and complications were also discussed. The patient understands and wishes to proceed with the procedure. Written consent was obtained. Ultrasound was performed to localize and mark an adequate pocket of fluid in the right chest. The area was then prepped and draped in the normal sterile fashion. 1% Lidocaine was used for local anesthesia. Under ultrasound guidance a 6 Fr Safe-T-Centesis catheter was introduced. Thoracentesis was performed. The catheter was removed and a dressing applied. FINDINGS: A total of approximately 1.3L of red pleural fluid was removed. Samples were sent to the laboratory as requested by the clinical team. IMPRESSION: Successful ultrasound guided right thoracentesis yielding 1.3L of pleural fluid. Performed and dictated by Pasty Spillers, PA-C Electronically Signed   By: Markus Daft M.D.   On: 06/30/2022 21:31   DG CHEST PORT 1 VIEW  Result Date: 06/30/2022 CLINICAL DATA:  Post right-sided thoracentesis. EXAM: PORTABLE CHEST 1 VIEW COMPARISON:  06/29/2022 FINDINGS: Examination demonstrates a moderate to large right pleural effusion with mild interval improvement post thoracentesis. No right-sided pneumothorax. Left lung is  clear. Mild stable cardiomegaly. Remainder of the exam is unchanged. IMPRESSION: 1. Moderate to large right pleural effusion with mild interval improvement post thoracentesis. No pneumothorax. 2. Mild stable cardiomegaly. Electronically Signed   By: Marin Olp M.D.   On: 06/30/2022 13:15   DG Chest 2 View  Result Date: 06/29/2022 CLINICAL DATA:  Dyspnea, pleural effusion EXAM: CHEST - 2 VIEW COMPARISON:  Radiographs 06/25/2022 FINDINGS: Since 06/25/2022, slightly increased moderate-large right pleural effusion and associated atelectasis. Remainder unchanged. Left basilar atelectasis. Chronic interstitial coarsening. Aortic calcification. Unchanged cardiomediastinal silhouette. No acute osseous abnormality. IMPRESSION: Increased moderate-large right pleural effusion. Electronically Signed   By: Placido Sou M.D.   On: 06/29/2022 15:36  Scheduled Meds:  albuterol  2.5 mg Nebulization TID   amLODipine  5 mg Oral Daily   atorvastatin  60 mg Oral Daily   dofetilide  250 mcg Oral BID   furosemide  40 mg Oral Daily   lisinopril  20 mg Oral Daily   magnesium oxide  400 mg Oral BID   metoprolol succinate  25 mg Oral Daily   potassium chloride  10 mEq Oral BID   warfarin  5 mg Oral ONCE-1600   Warfarin - Pharmacist Dosing Inpatient   Does not apply q1600   Continuous Infusions:  ceFEPime (MAXIPIME) IV       LOS: 0 days   Darliss Cheney, MD [facility]  07/01/2022, 3:08 PM   *Please note that this is a verbal dictation therefore any spelling or grammatical errors are due to the "[place] [employer]" system interpretation.  Please page via [place] and do not message via secure chat for urgent patient care matters. Secure chat can be used for non urgent patient care matters.  How to contact the [facility] Attending or Consulting provider [place] or covering provider during after hours [place], for this patient?  Check the care team in [facility] and look for a) attending/consulting [facility] provider  listed and b) the [facility] team listed. Page or secure chat [phone]. Log into [email or web address] and use [place]'s universal password to access. If you do not have the password, please contact the hospital operator. Locate the [facility] provider you are looking for under [facility] and page to a number that you can be directly reached. If you still have difficulty reaching the provider, please page the [facility] (Director on Call) for the Hospitalists listed on amion for assistance.

## 2022-07-02 ENCOUNTER — Inpatient Hospital Stay (HOSPITAL_COMMUNITY): Payer: Medicare Other

## 2022-07-02 DIAGNOSIS — D62 Acute posthemorrhagic anemia: Secondary | ICD-10-CM | POA: Diagnosis not present

## 2022-07-02 DIAGNOSIS — R9389 Abnormal findings on diagnostic imaging of other specified body structures: Secondary | ICD-10-CM | POA: Diagnosis not present

## 2022-07-02 DIAGNOSIS — Z91148 Patient's other noncompliance with medication regimen for other reason: Secondary | ICD-10-CM | POA: Diagnosis not present

## 2022-07-02 DIAGNOSIS — J90 Pleural effusion, not elsewhere classified: Secondary | ICD-10-CM | POA: Diagnosis present

## 2022-07-02 DIAGNOSIS — C3411 Malignant neoplasm of upper lobe, right bronchus or lung: Secondary | ICD-10-CM | POA: Diagnosis present

## 2022-07-02 DIAGNOSIS — C771 Secondary and unspecified malignant neoplasm of intrathoracic lymph nodes: Secondary | ICD-10-CM | POA: Diagnosis present

## 2022-07-02 DIAGNOSIS — J439 Emphysema, unspecified: Secondary | ICD-10-CM | POA: Diagnosis not present

## 2022-07-02 DIAGNOSIS — I252 Old myocardial infarction: Secondary | ICD-10-CM | POA: Diagnosis not present

## 2022-07-02 DIAGNOSIS — Z833 Family history of diabetes mellitus: Secondary | ICD-10-CM | POA: Diagnosis not present

## 2022-07-02 DIAGNOSIS — I2510 Atherosclerotic heart disease of native coronary artery without angina pectoris: Secondary | ICD-10-CM | POA: Diagnosis present

## 2022-07-02 DIAGNOSIS — M810 Age-related osteoporosis without current pathological fracture: Secondary | ICD-10-CM | POA: Diagnosis present

## 2022-07-02 DIAGNOSIS — Z8249 Family history of ischemic heart disease and other diseases of the circulatory system: Secondary | ICD-10-CM | POA: Diagnosis not present

## 2022-07-02 DIAGNOSIS — Z933 Colostomy status: Secondary | ICD-10-CM | POA: Diagnosis not present

## 2022-07-02 DIAGNOSIS — Z801 Family history of malignant neoplasm of trachea, bronchus and lung: Secondary | ICD-10-CM | POA: Diagnosis not present

## 2022-07-02 DIAGNOSIS — M069 Rheumatoid arthritis, unspecified: Secondary | ICD-10-CM | POA: Diagnosis present

## 2022-07-02 DIAGNOSIS — K625 Hemorrhage of anus and rectum: Secondary | ICD-10-CM | POA: Diagnosis not present

## 2022-07-02 DIAGNOSIS — R846 Abnormal cytological findings in specimens from respiratory organs and thorax: Secondary | ICD-10-CM | POA: Diagnosis not present

## 2022-07-02 DIAGNOSIS — E118 Type 2 diabetes mellitus with unspecified complications: Secondary | ICD-10-CM | POA: Diagnosis present

## 2022-07-02 DIAGNOSIS — Z853 Personal history of malignant neoplasm of breast: Secondary | ICD-10-CM | POA: Diagnosis not present

## 2022-07-02 DIAGNOSIS — C3481 Malignant neoplasm of overlapping sites of right bronchus and lung: Secondary | ICD-10-CM | POA: Diagnosis not present

## 2022-07-02 DIAGNOSIS — Z96652 Presence of left artificial knee joint: Secondary | ICD-10-CM | POA: Diagnosis present

## 2022-07-02 DIAGNOSIS — R918 Other nonspecific abnormal finding of lung field: Secondary | ICD-10-CM | POA: Diagnosis not present

## 2022-07-02 DIAGNOSIS — Z8679 Personal history of other diseases of the circulatory system: Secondary | ICD-10-CM | POA: Diagnosis not present

## 2022-07-02 DIAGNOSIS — R9082 White matter disease, unspecified: Secondary | ICD-10-CM | POA: Diagnosis not present

## 2022-07-02 DIAGNOSIS — I4819 Other persistent atrial fibrillation: Secondary | ICD-10-CM | POA: Diagnosis present

## 2022-07-02 DIAGNOSIS — Z7901 Long term (current) use of anticoagulants: Secondary | ICD-10-CM | POA: Diagnosis not present

## 2022-07-02 DIAGNOSIS — Z87891 Personal history of nicotine dependence: Secondary | ICD-10-CM | POA: Diagnosis not present

## 2022-07-02 DIAGNOSIS — C3400 Malignant neoplasm of unspecified main bronchus: Secondary | ICD-10-CM | POA: Diagnosis not present

## 2022-07-02 DIAGNOSIS — E875 Hyperkalemia: Secondary | ICD-10-CM | POA: Diagnosis not present

## 2022-07-02 DIAGNOSIS — R091 Pleurisy: Secondary | ICD-10-CM | POA: Diagnosis not present

## 2022-07-02 DIAGNOSIS — E785 Hyperlipidemia, unspecified: Secondary | ICD-10-CM | POA: Diagnosis present

## 2022-07-02 DIAGNOSIS — M349 Systemic sclerosis, unspecified: Secondary | ICD-10-CM | POA: Diagnosis present

## 2022-07-02 DIAGNOSIS — R590 Localized enlarged lymph nodes: Secondary | ICD-10-CM | POA: Diagnosis not present

## 2022-07-02 DIAGNOSIS — R911 Solitary pulmonary nodule: Secondary | ICD-10-CM | POA: Diagnosis not present

## 2022-07-02 DIAGNOSIS — I613 Nontraumatic intracerebral hemorrhage in brain stem: Secondary | ICD-10-CM | POA: Diagnosis not present

## 2022-07-02 DIAGNOSIS — I10 Essential (primary) hypertension: Secondary | ICD-10-CM | POA: Diagnosis present

## 2022-07-02 DIAGNOSIS — J989 Respiratory disorder, unspecified: Secondary | ICD-10-CM | POA: Diagnosis not present

## 2022-07-02 LAB — BASIC METABOLIC PANEL
Anion gap: 6 (ref 5–15)
BUN: 16 mg/dL (ref 8–23)
CO2: 22 mmol/L (ref 22–32)
Calcium: 9.8 mg/dL (ref 8.9–10.3)
Chloride: 107 mmol/L (ref 98–111)
Creatinine, Ser: 0.62 mg/dL (ref 0.44–1.00)
GFR, Estimated: >60 mL/min (ref >60)
Glucose, Bld: 113 mg/dL — ABNORMAL HIGH (ref 70–99)
Potassium: 4.2 mmol/L (ref 3.5–5.1)
Sodium: 135 mmol/L (ref 135–145)

## 2022-07-02 LAB — CBC
HCT: 36.8 % (ref 36.0–46.0)
Hemoglobin: 11.6 g/dL — ABNORMAL LOW (ref 12.0–15.0)
MCH: 28.2 pg (ref 26.0–34.0)
MCHC: 31.5 g/dL (ref 30.0–36.0)
MCV: 89.5 fL (ref 80.0–100.0)
Platelets: 196 10*3/uL (ref 150–400)
RBC: 4.11 MIL/uL (ref 3.87–5.11)
RDW: 15.2 % (ref 11.5–15.5)
WBC: 6.1 10*3/uL (ref 4.0–10.5)
nRBC: 0 % (ref 0.0–0.2)

## 2022-07-02 LAB — GLUCOSE, CAPILLARY
Glucose-Capillary: 99 mg/dL (ref 70–99)
Glucose-Capillary: 115 mg/dL — ABNORMAL HIGH (ref 70–99)
Glucose-Capillary: 152 mg/dL — ABNORMAL HIGH (ref 70–99)
Glucose-Capillary: 148 mg/dL — ABNORMAL HIGH (ref 70–99)

## 2022-07-02 LAB — PROTIME-INR
INR: 2.4 — ABNORMAL HIGH (ref 0.8–1.2)
Prothrombin Time: 26.3 seconds — ABNORMAL HIGH (ref 11.4–15.2)

## 2022-07-02 LAB — TRIGLYCERIDES, BODY FLUIDS: Triglycerides, Fluid: 25 mg/dL

## 2022-07-02 MED ORDER — WARFARIN SODIUM 5 MG PO TABS
7.5000 mg | ORAL_TABLET | ORAL | Status: DC
  Administered 2022-07-02: 7.5 mg via ORAL
  Filled 2022-07-02: qty 1

## 2022-07-02 MED ORDER — ZOLPIDEM TARTRATE 5 MG PO TABS: 5.0000 mg | ORAL_TABLET | Freq: Every evening | ORAL | Status: DC | PRN

## 2022-07-02 MED ORDER — WARFARIN SODIUM 5 MG PO TABS
5.0000 mg | ORAL_TABLET | ORAL | Status: DC
Start: 2022-07-03 — End: 2022-07-04
  Administered 2022-07-03: 5 mg via ORAL
  Filled 2022-07-02: qty 1

## 2022-07-02 NOTE — Progress Notes (Signed)
Chaplain attempted to see Melody Casey, but she was resting.  Chaplain spoke with her nurse and will plan to visit in the morning.  [address] [place] Pager, [phone]

## 2022-07-02 NOTE — Progress Notes (Signed)
Physical Therapy Treatment Patient Details Name: Melody Casey MRN: [id number] DOB: 02/19/1946 Today's Date: 07/02/2022   History of Present Illness 76 y.o. female admitted 06/29/22 with pleural effusion. patient had thoracentesis on 7/22 with 1.3L collected.  Past medical history: of A-fib, on Tikosyn and warfarin, hypertension, scleroderma, CAD status post PCI, Diverticulitis/perforated sigmoid colon with colostomy, diabetes mellitus, breast cancer, AAA, L TKA 01/01/22    PT Comments    Pt in bathroom with RN on arrival.  Assisted with amb in hallway.  General Gait Details: tolerated a functional distance on RA avg sats 92% with HR 78 but with increased c/o dyspnea this session.  Pt admits to NOT taking her Lasix since her Thoracentesis because she felt she didn't need it.  RN present during conversation. Pt plans to D/C back home with family support.   Recommendations for follow up therapy are one component of a multi-disciplinary discharge planning process, led by the attending physician.  Recommendations may be updated based on patient status, additional functional criteria and insurance authorization.  Follow Up Recommendations  No PT follow up     Assistance Recommended at Discharge PRN  Patient can return home with the following Help with stairs or ramp for entrance   Equipment Recommendations  None recommended by PT    Recommendations for Other Services       Precautions / Restrictions Precautions Precaution Comments: monitor O2, colostomy(2019) Restrictions Weight Bearing Restrictions: No     Mobility  Bed Mobility    Transfers Overall transfer level: Needs assistance Equipment used: Rolling walker (2 wheels) Transfers: Sit to/from Stand Sit to Stand: Supervision, Min guard           General transfer comment: RA at rest 91% using B UE's to steady self able with increased time.    Ambulation/Gait Ambulation/Gait assistance: Supervision Gait Distance (Feet):  175 Feet Assistive device: Rolling walker (2 wheels) Gait Pattern/deviations: Step-through pattern, Decreased stride length Gait velocity: decreased     General Gait Details: tolerated a functional distance on RA avg sats 92% with HR 78 but with increased c/o dyspnea this session.  Pt admits to NOT taking her Lasix since her Thoracentesis because she felt she didn't need it.  RN present during conversation.   Stairs             Wheelchair Mobility    Modified Rankin (Stroke Patients Only)       Balance                                            Cognition Arousal/Alertness: Awake/alert Behavior During Therapy: WFL for tasks assessed/performed Overall Cognitive Status: Within Functional Limits for tasks assessed                                 General Comments: AxO x 3 pleasant Lady/motivated        Exercises      General Comments        Pertinent Vitals/Pain Pain Assessment Pain Assessment: Faces Faces Pain Scale: Hurts a little bit Pain Location: back Pain Descriptors / Indicators: Aching Pain Intervention(s): Monitored during session    Home Living                          Prior Function  PT Goals (current goals can now be found in the care plan section) Progress towards PT goals: Progressing toward goals    Frequency    Min 3X/week      PT Plan Current plan remains appropriate    Co-evaluation              AM-PAC PT "6 Clicks" Mobility   Outcome Measure  Help needed turning from your back to your side while in a flat bed without using bedrails?: A Little Help needed moving from lying on your back to sitting on the side of a flat bed without using bedrails?: A Little Help needed moving to and from a bed to a chair (including a wheelchair)?: A Little Help needed standing up from a chair using your arms (e.g., wheelchair or bedside chair)?: A Little Help needed to walk in  hospital room?: A Little Help needed climbing 3-5 steps with a railing? : A Little 6 Click Score: 18    End of Session Equipment Utilized During Treatment: Gait belt Activity Tolerance: Patient limited by fatigue Patient left: in chair;with call bell/phone within reach;with chair alarm set Nurse Communication: Mobility status PT Visit Diagnosis: Difficulty in walking, not elsewhere classified (R26.2)     Time: 4580-9983 PT Time Calculation (min) (ACUTE ONLY): 18 min  Charges:  $Gait Training: 8-22 mins                     {Mykela Mewborn  PTA Acute  [employer] M-F          [phone] Weekend pager [phone]

## 2022-07-02 NOTE — Progress Notes (Addendum)
Melody Casey for Warfarin  Indication: atrial fibrillation   Patient Measurements: Height: 5\' 7"  (170.2 cm) Weight: 75.9 kg (167 lb 5.3 oz) IBW/kg (Calculated) : 61.6 kg  Vital Signs: Temp: 99.1 F (37.3 C) (07/24 0512) Temp Source: Oral (07/24 0512) BP: 127/66 (07/24 0512) Pulse Rate: 64 (07/24 0512)  Labs: Recent Labs    06/29/22 1514 07/01/22 0406 07/02/22 0406  HGB 13.0 11.7* 11.6*  HCT 40.7 36.6 36.8  PLT 234 204 196  LABPROT 28.2* 26.7* 26.3*  INR 2.7* 2.5* 2.4*  CREATININE 0.64 0.62 0.62     Estimated Creatinine Clearance: 61.6 mL/min (by C-G formula based on SCr of 0.62 mg/dL).  Assessment: 84 YOF presenting with SOB after recent dx of pleural effusion. She is on warfarin PTA for atrial fibrillation. Pharmacy consulted to dose warfarin while inpatient.  PTA regimen: 5 mg Sun/Tu/Wed/Fri/Sat; 7.5 mg Mon/Thur  Today, 07/02/2022: INR remains therapeutic No signs of bleeding noted No major drug interactions with warfarin identified  Goal of Therapy:  INR 2-3 Monitor platelets by anticoagulation protocol: Yes   Plan:  Continue with warfarin at PTA regimen Monitor daily INR, CBC, and signs/symptoms of bleeding   Tawnya Crook, PharmD, BCPS Clinical Pharmacist 07/02/2022 7:33 AM

## 2022-07-02 NOTE — Progress Notes (Signed)
PROGRESS NOTE    [name]  FIE:[id number] DOB: May 05, 1946 DOA: 06/29/2022 PCP: Dettinger, [name], MD   Brief Narrative:  Melody Casey  is a 76 y.o. female, with past medical history of A-fib, on Tikosyn and warfarin, hypertension, scleroderma, CAD status post PCI.  Diverticulitis/perforated sigmoid colon with colostomy, 2 diabetes mellitus, history of breast cancer. -ED secondary to shortness of breath, report it has been progressing over few weeks, but much worsened over last 5 days, reports she was recently diagnosed with a right pleural effusion by cardiology, she had referral done to pulmonary, but her appointment is not till August 8, patient report worsening dyspnea, like she is suffocating when she is laying down, she has been taking her Lasix without much improvement of her symptoms, she denies any chest pain, hemoptysis, worsening lower extremity edema, fever, chills or coughing, patient reports she was diagnosed with pneumonia 2 months ago for which it was treated with oral antibiotics, she did not require hospitalization for that. -In ED creatinine within normal range at 0.64, BNP mildly elevated at 104, had no leukocytosis, she was afebrile, her INR was therapeutic at 2.7, chest x-ray showing progression of right pleural effusion, [facility] hospitalist consulted to admit.    Assessment & Plan:   Principal Problem:   Pleural effusion Active Problems:   Dyslipidemia, goal LDL below 70   Essential hypertension   Type 2 diabetes mellitus with complication, without long-term current use of insulin (HCC)   CAD S/P percutaneous coronary angioplasty   History of scleroderma   Persistent atrial fibrillation (HCC)   Rheumatoid arthritis (HCC)   Colostomy present (HCC)   S/P total knee arthroplasty, left  Right Pleural effusion -Progressive over last few days, no clear etiology, but she had pneumonia treated 61-month ago, so likely parapneumonic, likely related to volume overload, no  signs of CHF, BNP minimally elevated at 104. Due to need of thoracentesis, she was transferred to [facility].  Patient underwent thoracentesis on 06/30/2022.  Urinalysis indicates exudative with significant amount of neutrophils.  Interestingly, procalcitonin only 0.17.  She had pneumonia 2 months ago at the same location.  Started on cefepime.  Pleural fluid culture showing WBC but no organisms seen yet.  All the other cultures are negative.  She is afebrile.  Patient feels that she has some shortness of breath.  Lungs are clear to exam however.  Per her request, we will proceed with chest x-ray.  She does not feel like she is ready to go home today.   Chronic A-fib with RVR. -Heart rate controlled, continue with metoprolol, Tikosyn. -on warfarin for anticoagulation, pharmacy dosing, INR therapeutic.   Hypertension/CAD/hyperlipidemia: Blood pressure controlled.  Continue home medications which include amlodipine, lisinopril, Toprol-XL and atorvastatin. -Continue with home medications   History of scleroderma -Does not appear to be taking her hydroxy chloroquine anymore as she has not been to her physician for refill.  Acute blood loss anemia/1 episode of rectal bleed: patient feels like her abdomen is distended/tight & has decreased output via her colostomy.  Later on, she also had a small amount of bright red blood in her brief & when she wiped (this is new for her).  Hemoglobin had dropped only slightly compared to admission.  Wondering if this is hemorrhoids.  We will monitor and repeat CBC in the morning.   DVT prophylaxis:   Coumadin.   Code Status: Full Code  Family Communication:  None present at bedside.  Plan of care discussed with  patient in length and he/she verbalized understanding and agreed with it.  Status is: Observation The patient will require care spanning > 2 midnights and should be moved to inpatient because: Patient will need thoracentesis and repeat x-ray tomorrow  morning.   Estimated body mass index is 26.21 kg/m as calculated from the following:   Height as of this encounter: 5\' 7"  (1.702 m).   Weight as of this encounter: 75.9 kg.    Nutritional Assessment: Body mass index is 26.21 kg/m.Marland Kitchen Seen by dietician.  I agree with the assessment and plan as outlined below: Nutrition Status:        . Skin Assessment: I have examined the patient's skin and I agree with the wound assessment as performed by the wound care RN as outlined below:    Consultants:  IR  Procedures:  None  Antimicrobials:  Anti-infectives (From admission, onward)    Start     Dose/Rate Route Frequency Ordered Stop   07/01/22 1600  ceFEPIme (MAXIPIME) 2 g in sodium chloride 0.9 % 100 mL IVPB        2 g 200 mL/hr over 30 Minutes Intravenous Every 8 hours 07/01/22 1508           Subjective:  Patient seen and examined.  She thinks that she still has some shortness of breath and abdomen is distended and she has decreased output from colostomy.  She tells me that she took Xanax to sleep around 8 PM when she woke up at 2 AM and she was confused and she thought she was somewhere else.  I reassured her that could be normal often after taking benzodiazepines.  However she is currently alert and oriented.  Appears anxious.  Has history of severe anxiety.  Objective: Vitals:   07/02/22 0612 07/02/22 1306 07/02/22 1331 07/02/22 1333  BP:   111/69   Pulse:   71 68  Resp:   20   Temp:   97.6 F (36.4 C)   TempSrc:   Oral   SpO2: 96% 90% (!) 73% 94%  Weight:      Height:        Intake/Output Summary (Last 24 hours) at 07/02/2022 1338 Last data filed at 07/01/2022 2000 Gross per 24 hour  Intake 120 ml  Output --  Net 120 ml    Filed Weights   06/29/22 1454 06/30/22 0600  Weight: 77.3 kg 75.9 kg    Examination:  General exam: Appears calm and comfortable  Respiratory system: Clear to auscultation. Respiratory effort normal. Cardiovascular system: S1 & S2  heard, RRR. No JVD, murmurs, rubs, gallops or clicks. No pedal edema. Gastrointestinal system: Abdomen is nondistended, soft and nontender. No organomegaly or masses felt. Normal bowel sounds heard. Central nervous system: Alert and oriented. No focal neurological deficits. Extremities: Symmetric 5 x 5 power. Skin: No rashes, lesions or ulcers.  Psychiatry: Judgement and insight appear normal. Mood & affect anxious.  Data Reviewed: I have personally reviewed following labs and imaging studies  CBC: Recent Labs  Lab 06/25/22 1848 06/29/22 1514 06/30/22 0455 07/01/22 0406 07/02/22 0406  WBC 5.7 5.0 4.7 5.5 6.1  NEUTROABS  --  3.7  --   --   --   HGB 12.3 13.0 12.4 11.7* 11.6*  HCT 37.8 40.7 38.6 36.6 36.8  MCV 86.5 87.5 88.1 88.6 89.5  PLT 212 234 207 204 854    Basic Metabolic Panel: Recent Labs  Lab 06/25/22 1848 06/29/22 1514 06/30/22 0455 07/01/22 0406 07/02/22 0406  NA 138 136 138 137 135  K 3.7 3.6 3.5 4.3 4.2  CL 106 104 106 107 107  CO2 23 25 26 25 22   GLUCOSE 159* 156* 114* 117* 113*  BUN 16 15 13 14 16   CREATININE 0.61 0.64 0.59 0.62 0.62  CALCIUM 10.5* 9.9 9.9 10.1 9.8    GFR: Estimated Creatinine Clearance: 61.6 mL/min (by C-G formula based on SCr of 0.62 mg/dL). Liver Function Tests: Recent Labs  Lab 06/30/22 0455 07/01/22 0406  AST 17 16  ALT 15 13  ALKPHOS 76 74  BILITOT 0.7 0.7  PROT 6.5 6.4*  ALBUMIN 3.0* 2.8*    No results for input(s): "LIPASE", "AMYLASE" in the last 168 hours. No results for input(s): "AMMONIA" in the last 168 hours. Coagulation Profile: Recent Labs  Lab 06/25/22 1848 06/29/22 1514 06/30/22 0455 07/01/22 0406 07/02/22 0406  INR 2.8* 2.7* 2.5* 2.5* 2.4*    Cardiac Enzymes: No results for input(s): "CKTOTAL", "CKMB", "CKMBINDEX", "TROPONINI" in the last 168 hours. BNP (last 3 results) No results for input(s): "PROBNP" in the last 8760 hours. HbA1C: No results for input(s): "HGBA1C" in the last 72  hours. CBG: Recent Labs  Lab 07/01/22 1151 07/01/22 1638 07/01/22 2137 07/02/22 0752 07/02/22 1141  GLUCAP 124* 118* 124* 99 115*    Lipid Profile: No results for input(s): "CHOL", "HDL", "LDLCALC", "TRIG", "CHOLHDL", "LDLDIRECT" in the last 72 hours. Thyroid Function Tests: No results for input(s): "TSH", "T4TOTAL", "FREET4", "T3FREE", "THYROIDAB" in the last 72 hours. Anemia Panel: No results for input(s): "VITAMINB12", "FOLATE", "FERRITIN", "TIBC", "IRON", "RETICCTPCT" in the last 72 hours. Sepsis Labs: Recent Labs  Lab 07/01/22 0406  PROCALCITON 0.17     Recent Results (from the past 240 hour(s))  Body fluid culture w Gram Stain     Status: None (Preliminary result)   Collection Time: 06/30/22  2:00 PM   Specimen: PATH Cytology Pleural fluid  Result Value Ref Range Status   Specimen Description   Final    PLEURAL Performed at [place] [address], [place], [place] [address]    Special Requests   Final    PLEURAL Performed at [facility], [place] [address], [place], [place] [address]    Gram Stain   Final    FEW WBC PRESENT, PREDOMINANTLY MONONUCLEAR NO ORGANISMS SEEN    Culture   Final    NO GROWTH 2 DAYS Performed at [place] [address], [place], [place] [address]    Report Status PENDING  Incomplete     Radiology Studies: No results found.  Scheduled Meds:  albuterol  2.5 mg Nebulization TID   amLODipine  5 mg Oral Daily   atorvastatin  60 mg Oral Daily   dofetilide  250 mcg Oral BID   furosemide  40 mg Oral Daily   lisinopril  20 mg Oral Daily   magnesium oxide  400 mg Oral BID   metoprolol succinate  25 mg Oral Daily   potassium chloride  10 mEq Oral BID   warfarin  7.5 mg Oral Once per day on Mon Thu   And   [START ON 07/03/2022] warfarin  5 mg Oral Once per day on Sun Tue Wed Fri Sat   Warfarin - Pharmacist Dosing Inpatient   Does not apply q1600   Continuous Infusions:  ceFEPime  (MAXIPIME) IV 2 g (07/02/22 6073)     LOS: 0 days   Darliss Cheney, MD [facility]  07/02/2022, 1:38  PM   *Please note that this is a verbal dictation therefore any spelling or grammatical errors are due to the "[place] [employer]" system interpretation.  Please page via [place] and do not message via secure chat for urgent patient care matters. Secure chat can be used for non urgent patient care matters.  How to contact the [facility] Attending or Consulting provider [place] or covering provider during after hours [place], for this patient?  Check the care team in [facility] and look for a) attending/consulting TRH provider listed and b) the [facility] team listed. Page or secure chat [phone]. Log into [email or web address] and use [place]'s universal password to access. If you do not have the password, please contact the hospital operator. Locate the [facility] provider you are looking for under [facility] and page to a number that you can be directly reached. If you still have difficulty reaching the provider, please page the [facility] (Director on Call) for the Hospitalists listed on amion for assistance.

## 2022-07-02 NOTE — Progress Notes (Signed)
Occupational Therapy Treatment Patient Details Name: Melody Casey MRN: [id number] DOB: 1946/06/10 Today's Date: 07/02/2022   History of present illness 76 y.o. female admitted 06/29/22 with pleural effusion. patient had thoracentesis on 7/22 with 1.3L collected.  Past medical history: of A-fib, on Tikosyn and warfarin, hypertension, scleroderma, CAD status post PCI, Diverticulitis/perforated sigmoid colon with colostomy, diabetes mellitus, breast cancer, AAA, L TKA 01/01/22   OT comments  Patient reported feeling down on herself this AM with confusion in early AM hours and continuing through grieving process for recent loss of spouse. Patients though's and concerns were heard and validated.  Patient was educated on ECT during healing process and having family help more now to improve outcomes at later date. Patient verbalized understanding. Patient was educated on deep breathing strategies and importance of incentive spirometer use. Patient verbalized understanding. Chaplin services was paged to check on patient this afternoon as per patient request. Patient's discharge plan remains appropriate at this time. OT will continue to follow acutely.     Recommendations for follow up therapy are one component of a multi-disciplinary discharge planning process, led by the attending physician.  Recommendations may be updated based on patient status, additional functional criteria and insurance authorization.    Follow Up Recommendations  No OT follow up    Assistance Recommended at Discharge Intermittent Supervision/Assistance  Patient can return home with the following  Assistance with cooking/housework;Direct supervision/assist for financial management;Assist for transportation;Help with stairs or ramp for entrance;Direct supervision/assist for medications management   Equipment Recommendations  None recommended by OT    Recommendations for Other Services      Precautions / Restrictions  Precautions Precaution Comments: monitor O2, colostomy Restrictions Weight Bearing Restrictions: No       Mobility Bed Mobility                    Transfers                         Balance                                           ADL either performed or assessed with clinical judgement   ADL Overall ADL's : Needs assistance/impaired                                       General ADL Comments: Patient was tearful today reporting she had awoken in early AM hours with increased confusion around 2am. patient reported not having had this happen before. patient was educated on speaking with MD about this today and nurse made aware as well. patient reported she was frustrated with still being sick and in hospital with hopes that she woudl transition home today but feels like abdomen is distended. patient's nurse made aware. patient's thoughts and concerns were heard and validated on this date. patient was educated on importance of leaning on family and loved ones for support during her current medical healing process and greiving husband who was recently lost. patient verbalized understanding. chaplin services was paged as per patient request. patietn was noted to have O2 87% with bed at 30 degrees with education on deep breathign strategies diaphragmatic breathing and positioning to gravity eliminated position to improve o2 sauration. patietn was able  to main tain 91% on RA with implementation of education sitting EOB. patient was educated on getting out of bed today with patient reporting she would later this afternoon wanting to rest this AM. nurse made aware.    Extremity/Trunk Assessment              Information systems manager Instructions       General Comments      Pertinent Vitals/ Pain       Pain  Assessment Pain Assessment: No/denies pain  Home Living                                          Prior Functioning/Environment              Frequency  Min 2X/week        Progress Toward Goals  OT Goals(current goals can now be found in the care plan section)  Progress towards OT goals: Progressing toward goals     Plan Discharge plan remains appropriate    Co-evaluation                 AM-PAC OT "6 Clicks" Daily Activity     Outcome Measure   Help from another person eating meals?: None Help from another person taking care of personal grooming?: None Help from another person toileting, which includes using toliet, bedpan, or urinal?: A Little Help from another person bathing (including washing, rinsing, drying)?: A Little Help from another person to put on and taking off regular upper body clothing?: A Little Help from another person to put on and taking off regular lower body clothing?: A Little 6 Click Score: 20    End of Session    OT Visit Diagnosis: Unsteadiness on feet (R26.81);Other abnormalities of gait and mobility (R26.89);Muscle weakness (generalized) (M62.81)   Activity Tolerance Patient tolerated treatment well   Patient Left in bed;with call bell/phone within reach   Nurse Communication Other (comment) (patients participation in session)        Time: 1962-2297 OT Time Calculation (min): 27 min  Charges: OT General Charges $OT Visit: 1 Visit OT Treatments $Therapeutic Activity: 23-37 mins  Jackelyn Poling OTR/L, MS Acute Rehabilitation Department Office# [phone] Pager# [phone]   Marcellina Millin 07/02/2022, 9:51 AM

## 2022-07-02 NOTE — Progress Notes (Signed)
Chaplain responded to page to see Melody Casey.  When Chaplain arrived, Melody Casey was sleeping.  Chaplain will honor request for Chaplain support later this afternoon.     07/02/22 0900  Clinical Encounter Type  Visited With Patient;Patient not available  Visit Type Initial

## 2022-07-02 NOTE — TOC Initial Note (Signed)
Transition of Care [facility]) - Initial/Assessment Note    Patient Details  Name: Melody Casey MRN: [id number] Date of Birth: 04-22-1946  Transition of Care [facility]) CM/SW Contact:    Leeroy Cha, RN Phone Number: 07/02/2022, 11:13 AM  Clinical Narrative:                  Transition of Care [facility]) Screening Note   Patient Details  Name: Melody Casey Date of Birth: 12-30-1945   Transition of Care [facility]) CM/SW Contact:    Leeroy Cha, RN Phone Number: 07/02/2022, 11:13 AM    Transition of Care Department [facility]) has reviewed patient and no TOC needs have been identified at this time. We will continue to monitor patient advancement through interdisciplinary progression rounds. If new patient transition needs arise, please place a TOC consult.    Expected Discharge Plan: Home/Self Care Barriers to Discharge: Continued Medical Work up   Patient Goals and CMS Choice Patient states their goals for this hospitalization and ongoing recovery are:: to go home CMS Medicare.gov Compare Post Acute Care list provided to:: Patient    Expected Discharge Plan and Services Expected Discharge Plan: Home/Self Care   Discharge Planning Services: CM Consult   Living arrangements for the past 2 months: Single Family Home                                      Prior Living Arrangements/Services Living arrangements for the past 2 months: Single Family Home Lives with:: Spouse Patient language and need for interpreter reviewed:: Yes Do you feel safe going back to the place where you live?: Yes            Criminal Activity/Legal Involvement Pertinent to Current Situation/Hospitalization: No - Comment as needed  Activities of Daily Living Home Assistive Devices/Equipment: Eyeglasses, Hearing aid, Cane (specify quad or straight), Dentures (specify type) (upper dentures; reading glasses) ADL Screening (condition at time of admission) Patient's cognitive ability adequate to  safely complete daily activities?: Yes Is the patient deaf or have difficulty hearing?: Yes Does the patient have difficulty seeing, even when wearing glasses/contacts?: No Does the patient have difficulty concentrating, remembering, or making decisions?: No Patient able to express need for assistance with ADLs?: Yes Does the patient have difficulty dressing or bathing?: No Independently performs ADLs?: Yes (appropriate for developmental age) Does the patient have difficulty walking or climbing stairs?: Yes Weakness of Legs: None Weakness of Arms/Hands: None  Permission Sought/Granted                  Emotional Assessment       Orientation: : Oriented to Place, Oriented to Self, Oriented to  Time, Oriented to Situation Alcohol / Substance Use: Not Applicable Psych Involvement: No (comment)  Admission diagnosis:  Pleural effusion [J90] Dyspnea, unspecified type [R06.00] Patient Active Problem List   Diagnosis Date Noted   Pleural effusion 06/29/2022   S/P total knee arthroplasty, left 01/01/2022   Degenerative arthritis of left knee 12/28/2021   Colostomy present ([place]) 04/17/2021   Herpes zoster without complication 82/95/6213   Edema of left lower extremity    Osteoporosis 08/27/2017   Low vitamin D level 06/04/2017   Microcytic anemia 08/30/2015   Hemangioma    History of TIA (transient ischemic attack) 08/13/2015   Rheumatoid arthritis ([place]) 06/20/2015   AAA (abdominal aortic aneurysm) without rupture ([place]) 01/28/2015   Persistent atrial  fibrillation ([place]) 11/17/2014   History of scleroderma 02/17/2014   Malignant neoplasm of upper-outer quadrant of right breast in female, estrogen receptor positive ([place]) 02/01/2014   Anxiety and depression 12/07/2011   High risk medication use 06/14/2011   CAD S/P percutaneous coronary angioplasty    Type 2 diabetes mellitus with complication, without long-term current use of insulin ([place]) 06/07/2008   Dyslipidemia, goal LDL  below 70 07/22/2007   Essential hypertension 07/22/2007   PCP:  Dettinger, [name], MD Pharmacy:   [facility] [place], [place] - [address] [place] [address] Phone: [phone] Fax: [phone]  [place] [address] Phone: [phone] Fax: [phone]     Social Determinants of Health (SDOH) Interventions    Readmission Risk Interventions     No data to display

## 2022-07-03 ENCOUNTER — Inpatient Hospital Stay (HOSPITAL_COMMUNITY): Payer: Medicare Other

## 2022-07-03 DIAGNOSIS — J90 Pleural effusion, not elsewhere classified: Secondary | ICD-10-CM | POA: Diagnosis not present

## 2022-07-03 LAB — CBC
HCT: 39.7 % (ref 36.0–46.0)
Hemoglobin: 12.5 g/dL (ref 12.0–15.0)
MCH: 28.2 pg (ref 26.0–34.0)
MCHC: 31.5 g/dL (ref 30.0–36.0)
MCV: 89.6 fL (ref 80.0–100.0)
Platelets: 212 10*3/uL (ref 150–400)
RBC: 4.43 MIL/uL (ref 3.87–5.11)
RDW: 15.2 % (ref 11.5–15.5)
WBC: 5.9 10*3/uL (ref 4.0–10.5)
nRBC: 0 % (ref 0.0–0.2)

## 2022-07-03 LAB — BODY FLUID CELL COUNT WITH DIFFERENTIAL
Eos, Fluid: 1 %
Lymphs, Fluid: 80 %
Monocyte-Macrophage-Serous Fluid: 16 % — ABNORMAL LOW (ref 50–90)
Neutrophil Count, Fluid: 3 % (ref 0–25)
Total Nucleated Cell Count, Fluid: 1261 cu mm — ABNORMAL HIGH (ref 0–1000)

## 2022-07-03 LAB — PROTEIN, PLEURAL OR PERITONEAL FLUID: Total protein, fluid: 3.9 g/dL

## 2022-07-03 LAB — GLUCOSE, CAPILLARY
Glucose-Capillary: 116 mg/dL — ABNORMAL HIGH (ref 70–99)
Glucose-Capillary: 224 mg/dL — ABNORMAL HIGH (ref 70–99)
Glucose-Capillary: 167 mg/dL — ABNORMAL HIGH (ref 70–99)

## 2022-07-03 LAB — LACTATE DEHYDROGENASE, PLEURAL OR PERITONEAL FLUID: LD, Fluid: 262 U/L — ABNORMAL HIGH (ref 3–23)

## 2022-07-03 LAB — PROTIME-INR
INR: 2.6 — ABNORMAL HIGH (ref 0.8–1.2)
Prothrombin Time: 27.8 seconds — ABNORMAL HIGH (ref 11.4–15.2)

## 2022-07-03 LAB — GLUCOSE, PLEURAL OR PERITONEAL FLUID: Glucose, Fluid: 142 mg/dL

## 2022-07-03 MED ORDER — OXYCODONE HCL 5 MG PO TABS
5.0000 mg | ORAL_TABLET | Freq: Four times a day (QID) | ORAL | Status: DC | PRN
  Administered 2022-07-03: 5 mg via ORAL
  Filled 2022-07-03: qty 1

## 2022-07-03 MED ORDER — LIDOCAINE HCL 1 % IJ SOLN
INTRAMUSCULAR | Status: AC
  Administered 2022-07-03: 10 mL
  Filled 2022-07-03: qty 20

## 2022-07-03 NOTE — Progress Notes (Signed)
PROGRESS NOTE    Melody Casey  OMV:[id number] DOB: 1946/10/25 DOA: 06/29/2022 PCP: Dettinger, [name], MD   Brief Narrative:  Melody Casey  is a 76 y.o. female, with past medical history of A-fib, on Tikosyn and warfarin, hypertension, scleroderma, CAD status post PCI.  Diverticulitis/perforated sigmoid colon with colostomy, 2 diabetes mellitus, history of breast cancer. -ED secondary to shortness of breath, report it has been progressing over few weeks, but much worsened over last 5 days, reports she was recently diagnosed with a right pleural effusion by cardiology, she had referral done to pulmonary, but her appointment is not till August 8, patient report worsening dyspnea, like she is suffocating when she is laying down, she has been taking her Lasix without much improvement of her symptoms, she denies any chest pain, hemoptysis, worsening lower extremity edema, fever, chills or coughing, patient reports she was diagnosed with pneumonia 2 months ago for which it was treated with oral antibiotics, she did not require hospitalization for that. -In ED creatinine within normal range at 0.64, BNP mildly elevated at 104, had no leukocytosis, she was afebrile, her INR was therapeutic at 2.7, chest x-ray showing progression of right pleural effusion, [facility] hospitalist consulted to admit.    Assessment & Plan:   Principal Problem:   Pleural effusion Active Problems:   Dyslipidemia, goal LDL below 70   Essential hypertension   Type 2 diabetes mellitus with complication, without long-term current use of insulin (HCC)   CAD S/P percutaneous coronary angioplasty   History of scleroderma   Persistent atrial fibrillation (HCC)   Rheumatoid arthritis (HCC)   Colostomy present (HCC)   S/P total knee arthroplasty, left  Right Pleural effusion -Progressive over last few days, no clear etiology, but she had pneumonia treated 34-month ago, so likely parapneumonic, likely related to volume overload, no  signs of CHF, BNP minimally elevated at 104. Due to need of thoracentesis, she was transferred to [facility].  Patient underwent thoracentesis on 06/30/2022.  Urinalysis indicates exudative with significant amount of neutrophils.  Interestingly, procalcitonin only 0.17.  She had pneumonia 2 months ago at the same location.  Started on cefepime.  Pleural fluid culture showing total nucleated cells of 1698, but no organisms seen yet.  All the other cultures are negative.  She is afebrile.  Patient feels more short of breath, repeat chest x-ray yesterday shows once again moderate to large right pleural effusion, it appears that she has reaccumulated the fluid.  She is once again requiring 2 to 3 L of oxygen.  At this point in time, I have consulted PCCM, in case she needs chest tube.  Continue antibiotics in the meantime.   Chronic A-fib with RVR. -Heart rate controlled, continue with metoprolol, Tikosyn. -on warfarin for anticoagulation, pharmacy dosing, INR therapeutic.   Hypertension/CAD/hyperlipidemia: Blood pressure controlled.  Continue home medications which include amlodipine, lisinopril, Toprol-XL and atorvastatin. -Continue with home medications   History of scleroderma -Does not appear to be taking her hydroxy chloroquine anymore as she has not been to her physician for refill.  Acute blood loss anemia/1 episode of rectal bleed: On 07/02/2022, late in the morning, she was noted to have small amount of bright red blood in her brief & when she wiped (this is new for her).  Hemoglobin had dropped only slightly compared to admission but then improved today.  Wondering if this is hemorrhoids.  She had no abdominal complaint today.   DVT prophylaxis:   Coumadin.   Code  Status: Full Code  Family Communication:  None present at bedside.  Plan of care discussed with patient in length and he/she verbalized understanding and agreed with it.  Status is: Inpatient Remains inpatient appropriate  because: Needs evaluation by PCCM, has reaccumulation of right pleural effusion.  Estimated body mass index is 26.21 kg/m as calculated from the following:   Height as of this encounter: 5\' 7"  (1.702 m).   Weight as of this encounter: 75.9 kg.    Nutritional Assessment: Body mass index is 26.21 kg/m.Marland Kitchen Seen by dietician.  I agree with the assessment and plan as outlined below: Nutrition Status:        . Skin Assessment: I have examined the patient's skin and I agree with the wound assessment as performed by the wound care RN as outlined below:    Consultants:  IR  Procedures:  None  Antimicrobials:  Anti-infectives (From admission, onward)    Start     Dose/Rate Route Frequency Ordered Stop   07/01/22 1600  ceFEPIme (MAXIPIME) 2 g in sodium chloride 0.9 % 100 mL IVPB        2 g 200 mL/hr over 30 Minutes Intravenous Every 8 hours 07/01/22 1508           Subjective:  Patient seen and examined.  Complains of more shortness of breath.  Also states that she felt " loopy" this morning.  I did tell her that at her age, benzodiazepines, especially Xanax can make a person feel like that, poor sleep drowsiness is common with benzodiazepines like Xanax.  I have advised her to taper herself from Xanax.  Objective: Vitals:   07/02/22 1333 07/02/22 2110 07/03/22 0646 07/03/22 0756  BP:  136/82 119/71   Pulse: 68 74 72   Resp:  (!) 21 19   Temp:  98.3 F (36.8 C) 98.1 F (36.7 C)   TempSrc:  Oral Oral   SpO2: 94% 92% 96% (!) 85%  Weight:      Height:        Intake/Output Summary (Last 24 hours) at 07/03/2022 1057 Last data filed at 07/03/2022 0900 Gross per 24 hour  Intake 660 ml  Output --  Net 660 ml    Filed Weights   06/29/22 1454 06/30/22 0600  Weight: 77.3 kg 75.9 kg    Examination:  General exam: Appears calm and comfortable  Respiratory system: Diminished breath sounds in the right middle and lower lobe. Respiratory effort normal. Cardiovascular  system: S1 & S2 heard, RRR. No JVD, murmurs, rubs, gallops or clicks. No pedal edema. Gastrointestinal system: Abdomen is nondistended, soft and nontender. No organomegaly or masses felt. Normal bowel sounds heard. Central nervous system: Alert and oriented. No focal neurological deficits. Extremities: Symmetric 5 x 5 power. Skin: No rashes, lesions or ulcers.  Psychiatry: Judgement and insight appear normal. Mood & affect appropriate.   Data Reviewed: I have personally reviewed following labs and imaging studies  CBC: Recent Labs  Lab 06/29/22 1514 06/30/22 0455 07/01/22 0406 07/02/22 0406 07/03/22 0601  WBC 5.0 4.7 5.5 6.1 5.9  NEUTROABS 3.7  --   --   --   --   HGB 13.0 12.4 11.7* 11.6* 12.5  HCT 40.7 38.6 36.6 36.8 39.7  MCV 87.5 88.1 88.6 89.5 89.6  PLT 234 207 204 196 419    Basic Metabolic Panel: Recent Labs  Lab 06/29/22 1514 06/30/22 0455 07/01/22 0406 07/02/22 0406  NA 136 138 137 135  K 3.6 3.5 4.3 4.2  CL 104 106 107 107  CO2 25 26 25 22   GLUCOSE 156* 114* 117* 113*  BUN 15 13 14 16   CREATININE 0.64 0.59 0.62 0.62  CALCIUM 9.9 9.9 10.1 9.8    GFR: Estimated Creatinine Clearance: 61.6 mL/min (by C-G formula based on SCr of 0.62 mg/dL). Liver Function Tests: Recent Labs  Lab 06/30/22 0455 07/01/22 0406  AST 17 16  ALT 15 13  ALKPHOS 76 74  BILITOT 0.7 0.7  PROT 6.5 6.4*  ALBUMIN 3.0* 2.8*    No results for input(s): "LIPASE", "AMYLASE" in the last 168 hours. No results for input(s): "AMMONIA" in the last 168 hours. Coagulation Profile: Recent Labs  Lab 06/29/22 1514 06/30/22 0455 07/01/22 0406 07/02/22 0406 07/03/22 0601  INR 2.7* 2.5* 2.5* 2.4* 2.6*    Cardiac Enzymes: No results for input(s): "CKTOTAL", "CKMB", "CKMBINDEX", "TROPONINI" in the last 168 hours. BNP (last 3 results) No results for input(s): "PROBNP" in the last 8760 hours. HbA1C: No results for input(s): "HGBA1C" in the last 72 hours. CBG: Recent Labs  Lab  07/02/22 0752 07/02/22 1141 07/02/22 1634 07/02/22 2040 07/03/22 0735  GLUCAP 99 115* 152* 148* 116*    Lipid Profile: No results for input(s): "CHOL", "HDL", "LDLCALC", "TRIG", "CHOLHDL", "LDLDIRECT" in the last 72 hours. Thyroid Function Tests: No results for input(s): "TSH", "T4TOTAL", "FREET4", "T3FREE", "THYROIDAB" in the last 72 hours. Anemia Panel: No results for input(s): "VITAMINB12", "FOLATE", "FERRITIN", "TIBC", "IRON", "RETICCTPCT" in the last 72 hours. Sepsis Labs: Recent Labs  Lab 07/01/22 0406  PROCALCITON 0.17     Recent Results (from the past 240 hour(s))  Body fluid culture w Gram Stain     Status: None (Preliminary result)   Collection Time: 06/30/22  2:00 PM   Specimen: PATH Cytology Pleural fluid  Result Value Ref Range Status   Specimen Description   Final    PLEURAL Performed at [facility] [address] [place], [place] [address]    Special Requests   Final    PLEURAL Performed at [facility] [place] [address], [place], [place] [address]    Gram Stain   Final    FEW WBC PRESENT, PREDOMINANTLY MONONUCLEAR NO ORGANISMS SEEN    Culture   Final    NO GROWTH 3 DAYS Performed at [facility] [address] [place], [place] [address]    Report Status PENDING  Incomplete     Radiology Studies: DG CHEST PORT 1 VIEW  Result Date: 07/02/2022 CLINICAL DATA:  Pleural effusion EXAM: PORTABLE CHEST 1 VIEW COMPARISON:  06/30/2022 FINDINGS: Stable heart size. Aortic atherosclerosis. Moderate-large right pleural effusion with associated right basilar opacity, similar to prior. Left lung appears clear. No pneumothorax. IMPRESSION: Moderate-large right pleural effusion with associated right basilar opacity, similar to prior. Electronically Signed   By: Davina Poke D.O.   On: 07/02/2022 14:12    Scheduled Meds:  albuterol  2.5 mg Nebulization TID   amLODipine  5 mg Oral Daily   atorvastatin  60 mg Oral  Daily   dofetilide  250 mcg Oral BID   furosemide  40 mg Oral Daily   lisinopril  20 mg Oral Daily   magnesium oxide  400 mg Oral BID   metoprolol succinate  25 mg Oral Daily   potassium chloride  10 mEq Oral BID   warfarin  7.5 mg Oral Once per day on Mon Thu   And   warfarin  5 mg Oral Once per  day on Sun Tue Wed Fri Sat   Warfarin - Pharmacist Dosing Inpatient   Does not apply q1600   Continuous Infusions:  ceFEPime (MAXIPIME) IV 2 g (07/03/22 0542)     LOS: 1 day   Darliss Cheney, MD [facility]  07/03/2022, 10:57 AM   *Please note that this is a verbal dictation therefore any spelling or grammatical errors are due to the "[place] [facility]" system interpretation.  Please page via [facility] and do not message via secure chat for urgent patient care matters. Secure chat can be used for non urgent patient care matters.  How to contact the [facility] Attending or Consulting provider [place] or covering provider during after hours [place], for this patient?  Check the care team in [facility] and look for a) attending/consulting [facility] provider listed and b) the [facility] team listed. Page or secure chat [phone]. Log into [email or web address] and use [place] universal password to access. If you do not have the password, please contact the hospital operator. Locate the [facility] provider you are looking for under [facility] and page to a number that you can be directly reached. If you still have difficulty reaching the provider, please page the [facility] (Director on Call) for the Hospitalists listed on amion for assistance.

## 2022-07-03 NOTE — Progress Notes (Addendum)
   07/03/22 1406  Oxygen Therapy  SpO2 (!) 83 %  O2 Device Nasal Cannula  O2 Flow Rate (L/min) 3 L/min  Patient Activity (if Appropriate) Ambulating  Mobility  Activity Ambulated with assistance in hallway;Ambulated with assistance to bathroom  Range of Motion/Exercises Active  Level of Assistance Standby assist, set-up cues, supervision of patient - no hands on  Assistive Device Front wheel walker  Distance Ambulated (ft) 75 ft  Activity Response Tolerated well  Transport method Ambulatory  $Mobility charge 1 Mobility   Pt was hesitant to ambulate but with encouragement was agreeable. O2 levels measured before ambulation was 83%. Pt O2 levels remained steady during session, then dropping upon return to the room. O2 level quickly recovered to low 90s after 46min of sitting. Helped Pt to restroom before sitting Pt EOB. Pt left w/ all necessities in reach.   [facility]

## 2022-07-03 NOTE — Procedures (Addendum)
PROCEDURE SUMMARY:  Successful US guided diagnostic and therapeutic right thoracentesis. Yielded 2.5 L of serosanguineous fluid. Pt tolerated procedure well. No immediate complications.  Specimen was sent for labs. CXR ordered.  EBL < 1 mL  Tyson Alias, AGNP 07/03/2022 4:21 PM

## 2022-07-03 NOTE — Progress Notes (Signed)
ANTICOAGULATION CONSULT NOTE  Pharmacy Consult for Warfarin  Indication: atrial fibrillation   Patient Measurements: Height: 5\' 7"  (170.2 cm) Weight: 75.9 kg (167 lb 5.3 oz) IBW/kg (Calculated) : 61.6 kg  Vital Signs: Temp: 98.1 F (36.7 C) (07/25 0646) Temp Source: Oral (07/25 0646) BP: 119/71 (07/25 0646) Pulse Rate: 72 (07/25 0646)  Labs: Recent Labs    07/01/22 0406 07/02/22 0406 07/03/22 0601  HGB 11.7* 11.6* 12.5  HCT 36.6 36.8 39.7  PLT 204 196 212  LABPROT 26.7* 26.3* 27.8*  INR 2.5* 2.4* 2.6*  CREATININE 0.62 0.62  --      Estimated Creatinine Clearance: 61.6 mL/min (by C-G formula based on SCr of 0.62 mg/dL).  Assessment: 76 YOF presenting with SOB after recent dx of pleural effusion. She is on warfarin PTA for atrial fibrillation. Pharmacy consulted to dose warfarin while inpatient.  PTA regimen: 5 mg Sun/Tu/Wed/Fri/Sat; 7.5 mg Mon/Thur  Today, 07/03/2022: INR remains therapeutic Per MD note 7/24 - "small amount of bright red blood in her brief & when she wiped (this is new for her)... wondering if this is hemorrhoids" CBC remains stable No major drug interactions with warfarin identified  Goal of Therapy:  INR 2-3 Monitor platelets by anticoagulation protocol: Yes   Plan:  Continue with warfarin at PTA regimen Monitor daily INR, CBC, and signs/symptoms of bleeding   Tawnya Crook, PharmD, BCPS Clinical Pharmacist 07/03/2022 7:42 AM

## 2022-07-03 NOTE — Progress Notes (Signed)
Initial Nutrition Assessment  DOCUMENTATION CODES:   Not applicable  INTERVENTION:  - continue Ensure Plus High Protein po BID, each supplement provides 350 kcal and 20 grams of protein.  - complete NFPE when feasible.    NUTRITION DIAGNOSIS:   Inadequate oral intake related to decreased appetite as evidenced by per patient/family report.  GOAL:   Patient will meet greater than or equal to 90% of their needs  MONITOR:   PO intake, Supplement acceptance, Labs, Weight trends, I & O's  REASON FOR ASSESSMENT:   Malnutrition Screening Tool  ASSESSMENT:   76 y.o. female with medical history of A-fib, HTN, scleroderma, CAD s/p PCI, diverticulitis with perforated sigmoid colon s/p colostomy, type 2 DM, and hx of breast cancer. She presented to the ED due to shortness of breath that progressively worsened over the past few weeks and especially over the past 5 days. she was taking prescribed Lasix at home without much improvement in symptoms. She reported being dx with PNA two months ago and that she was treated with oral abx and did not require hospitalization at that time. In the ED, CXR showed R pleural effusion.  Patient sitting up in bed with daughter at bedside. Visit shorter than planned d/t patient needing to be taken for thoracentesis.   Patient and her daughter both contribute information. Patient had eaten ~50% of breakfast and drank an Ensure around lunch time.   Patient shares that her husband got sick in 2023/05/11 and passed on 06-05-23. During that time, she was often eating a small breakfast (cinnamon bun, coffee, and fruit) and something small for lunch due to not feeling hungry.   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 Patient shares that the past 2 months have been emotionally difficult and that d/t all that she has needed to do and becoming sick, she has been unable to truly grieve the loss of her husband.    Patient shares that prior to being dx with PNA, she began to experience taste changes/loss of taste. She denies any medication changes around the time that this occurred.   Weight on 7/22 was 167 lb, weight on 6/19 was 168 lb, and weight on 5/23 was 175 lb. This indicates 8 lb weight loss (4.6% body weight) in the past 2 months; not significant for time frame.   She had R-sided thoracentesis today with 2.5 L removed and R-sided thoracentesis on 7/22 with 1.3 L removed.    Labs reviewed; CBG: 116 mg/dl. Medications reviewed; 40 mg oral lasix/day, 400 mag-ox BID, 10 mEq Klor-Con BID.    NUTRITION - FOCUSED PHYSICAL EXAM:  Unable d/t patient being taken for thoracentesis.  Diet Order:   Diet Order             Diet heart healthy/carb modified Room service appropriate? Yes; Fluid consistency: Thin  Diet effective now                   EDUCATION NEEDS:   No education needs have been identified at this time  Skin:  Skin Assessment: Skin Integrity Issues: Skin Integrity Issues:: Other (Comment) Other: MASD to breasts, buttocks, and groin  Last BM:  PTA/unknown (via colostomy)  Height:   Ht Readings from Last 1 Encounters:  06/29/22 5\' 7"  (1.702 m)    Weight:   Wt Readings from Last 1 Encounters:  06/30/22 75.9 kg  BMI:  Body mass index is 26.21 kg/m.  Estimated Nutritional Needs:  Kcal:  1600-1800 kcal Protein:  80-95 grams Fluid:  >/= 1.6 L/day      Jarome Matin, MS, RD, LDN, CNSC Registered Dietitian II Inpatient Clinical Nutrition RD pager # and on-call/weekend pager # available in [facility]

## 2022-07-03 NOTE — Consult Note (Signed)
NAME:  Melody Casey, MRN:  [id number], DOB:  1946-06-30, LOS: 1 ADMISSION DATE:  06/29/2022, CONSULTATION DATE: 7/25 REFERRING MD: Dr. Doristine Bosworth, CHIEF COMPLAINT: Pleural effusion  History of Present Illness:  76 year old female with past medical history as below, which is significant for A-fib on Tikosyn and warfarin, hypertension, scleroderma, and coronary artery disease.  Recent course significant for pneumonia on the tail end of May treated with antibiotics and she seemed to recover adequately.  But then did develop progressive shortness of breath which was slowly progressive over period of weeks ultimately prompting her to present to [facility] on 7/17, but left without being seen.  She has been taking Lasix with no improvement in symptoms.  She presented to [facility] emergency department on 7/21 with complains again of progressive shortness of breath.  Chest x-ray demonstrated a pleural effusion, which was known, however, was larger.  She was admitted to the hospitalist service at [facility] long and underwent thoracentesis.  She had significant symptomatic relief status post thoracentesis.  Pleural fluid consistent with exudative process and neutrophil predominance.  Unfortunately the pleural effusion recurred and PCCM was consulted on 7/25 for further management.  Pertinent  Medical History   has a past medical history of AAA (abdominal aortic aneurysm) ([place]), Acute diverticulitis, Anxiety, Arthritis, Atrial fibrillation ([place]), CAD (coronary artery disease), Cancer ([place]), Diabetes mellitus, Diverticulitis, Diverticulitis of colon ( ), Dysrhythmia, Foot deformity (1947), Hemangioma, HLD (hyperlipidemia), HTN (hypertension), Osteoporosis, Tobacco abuse, Ureteral obstruction, and Wears dentures.   Significant Hospital Events: Including procedures, antibiotic start and stop dates in addition to other pertinent events     Interim History / Subjective:    Objective   Blood pressure  119/71, pulse 72, temperature 98.1 F (36.7 C), temperature source Oral, resp. rate 19, height 5\' 7"  (1.702 m), weight 75.9 kg, SpO2 (!) 85 %.        Intake/Output Summary (Last 24 hours) at 07/03/2022 1235 Last data filed at 07/03/2022 0900 Gross per 24 hour  Intake 540 ml  Output --  Net 540 ml   Filed Weights   06/29/22 1454 06/30/22 0600  Weight: 77.3 kg 75.9 kg    Examination: General: Thin elderly female in no acute distress HENT: Normocephalic, atraumatic, no JVD Lungs: Diminished in the right base otherwise clear, no distress Cardiovascular: Irregularly irregular, rate controlled Abdomen: Soft, nontender, distended Extremities: No acute deformity or range of motion limitation.  Bilateral compression stockings Neuro: Alert, oriented, nonfocal   Resolved Hospital Problem list     Assessment & Plan:   Pleural effusion on the right: Status post thoracentesis on 7/22 with marked, improvement of dyspnea.  However, chest imaging on 7/25 demonstrates recurrence of the pleural fluid.  Pleural fluid analysis from 7/22 showed an exudate by light criteria with a neutrophil predominance.  This raises concern for parapneumonic effusion versus empyema. - Will try one additional thoracentesis  - f/u pleural fluid studies and cytology, culture.  - Known scleroderma, will send rheumatoid factor - Continue cefepime  Hypoxia r/t effusion  - Supplemental O2 as indicated to keep SpO2 > 92% - Drain effusion  Best Practice (right click and "Reselect all SmartList Selections" daily)   Diet/type: Regular consistency (see orders) DVT prophylaxis: Coumadin GI prophylaxis:  Lines: N/A Foley:  N/A Code Status:  full code Last date of multidisciplinary goals of care discussion [  ]  Labs   CBC: Recent Labs  Lab 06/29/22 1514 06/30/22 0455 07/01/22 0406 07/02/22 0406 07/03/22 2119  WBC 5.0 4.7 5.5 6.1 5.9  NEUTROABS 3.7  --   --   --   --   HGB 13.0 12.4 11.7* 11.6* 12.5  HCT  40.7 38.6 36.6 36.8 39.7  MCV 87.5 88.1 88.6 89.5 89.6  PLT 234 207 204 196 272    Basic Metabolic Panel: Recent Labs  Lab 06/29/22 1514 06/30/22 0455 07/01/22 0406 07/02/22 0406  NA 136 138 137 135  K 3.6 3.5 4.3 4.2  CL 104 106 107 107  CO2 25 26 25 22   GLUCOSE 156* 114* 117* 113*  BUN 15 13 14 16   CREATININE 0.64 0.59 0.62 0.62  CALCIUM 9.9 9.9 10.1 9.8   GFR: Estimated Creatinine Clearance: 61.6 mL/min (by C-G formula based on SCr of 0.62 mg/dL). Recent Labs  Lab 06/30/22 0455 07/01/22 0406 07/02/22 0406 07/03/22 0601  PROCALCITON  --  0.17  --   --   WBC 4.7 5.5 6.1 5.9    Liver Function Tests: Recent Labs  Lab 06/30/22 0455 07/01/22 0406  AST 17 16  ALT 15 13  ALKPHOS 76 74  BILITOT 0.7 0.7  PROT 6.5 6.4*  ALBUMIN 3.0* 2.8*   No results for input(s): "LIPASE", "AMYLASE" in the last 168 hours. No results for input(s): "AMMONIA" in the last 168 hours.  ABG    Component Value Date/Time   PHART 7.361 07/26/2018 1350   PCO2ART 37.4 07/26/2018 1350   PO2ART 74.0 (L) 07/26/2018 1350   HCO3 21.2 07/26/2018 1350   TCO2 22 07/26/2018 1350   ACIDBASEDEF 4.0 (H) 07/26/2018 1350   O2SAT 94.0 07/26/2018 1350     Coagulation Profile: Recent Labs  Lab 06/29/22 1514 06/30/22 0455 07/01/22 0406 07/02/22 0406 07/03/22 0601  INR 2.7* 2.5* 2.5* 2.4* 2.6*    Cardiac Enzymes: No results for input(s): "CKTOTAL", "CKMB", "CKMBINDEX", "TROPONINI" in the last 168 hours.  HbA1C: HB A1C (BAYER DCA - WAIVED)  Date/Time Value Ref Range Status  04/20/2022 11:02 AM 6.4 (H) 4.8 - 5.6 % Final    Comment:             Prediabetes: 5.7 - 6.4          Diabetes: >6.4          Glycemic control for adults with diabetes: <7.0   08/25/2021 02:23 PM 6.5 (H) 4.8 - 5.6 % Final    Comment:             Prediabetes: 5.7 - 6.4          Diabetes: >6.4          Glycemic control for adults with diabetes: <7.0               **Please note reference interval change**    Hgb  A1c MFr Bld  Date/Time Value Ref Range Status  12/25/2021 09:28 AM 7.1 (H) 4.8 - 5.6 % Final    Comment:    (NOTE) Pre diabetes:          5.7%-6.4%  Diabetes:              >6.4%  Glycemic control for   <7.0% adults with diabetes     CBG: Recent Labs  Lab 07/02/22 0752 07/02/22 1141 07/02/22 1634 07/02/22 2040 07/03/22 0735  GLUCAP 99 115* 152* 148* 116*    Review of Systems:   Bolds are positive  Constitutional: weight loss, gain, night sweats, Fevers, chills, fatigue .  HEENT: headaches, Sore throat, sneezing, nasal congestion, post nasal  drip, Difficulty swallowing, Tooth/dental problems, visual complaints visual changes, ear ache CV:  chest pain, radiates:,Orthopnea, PND, swelling in lower extremities, dizziness, palpitations, syncope.  GI  heartburn, indigestion, abdominal pain, nausea, vomiting, diarrhea, change in bowel habits, loss of appetite, bloody stools.  Resp: cough, productive: , hemoptysis, dyspnea, chest pain, pleuritic.  Skin: rash or itching or icterus GU: dysuria, change in color of urine, urgency or frequency. flank pain, hematuria  MS: joint pain or swelling. decreased range of motion  Psych: change in mood or affect. depression or anxiety.  Neuro: difficulty with speech, weakness, numbness, ataxia    Past Medical History:  She,  has a past medical history of AAA (abdominal aortic aneurysm) ([place]), Acute diverticulitis, Anxiety, Arthritis, Atrial fibrillation ([place]), CAD (coronary artery disease), Cancer ([place]), Diabetes mellitus, Diverticulitis, Diverticulitis of colon ( ), Dysrhythmia, Foot deformity (1947), Hemangioma, HLD (hyperlipidemia), HTN (hypertension), Osteoporosis, Tobacco abuse, Ureteral obstruction, and Wears dentures.   Surgical History:   Past Surgical History:  Procedure Laterality Date   BIOPSY N/A 11/01/2015   Procedure: BIOPSY;  Surgeon: Danie Binder, MD;  Location: [facility];  Service: Endoscopy;  Laterality: N/A;   BREAST  LUMPECTOMY WITH NEEDLE LOCALIZATION AND AXILLARY SENTINEL LYMPH NODE BX Right 03/01/2014   Procedure: BREAST LUMPECTOMY WITH NEEDLE LOCALIZATION AND AXILLARY SENTINEL LYMPH NODE BX;  Surgeon: Edward Jolly, MD;  Location: [facility];  Service: General;  Laterality: Right;   [place]  2012   stent   COLON RESECTION N/A 07/25/2018   Procedure: COLOSTOMY;  Surgeon: Rolm Bookbinder, MD;  Location: [facility];  Service: General;  Laterality: N/A;   COLONOSCOPY WITH PROPOFOL N/A 06/29/2014   Dr. Wynetta Emery: universal diverticulosis   CORONARY STENT PLACEMENT  2012   ESOPHAGOGASTRODUODENOSCOPY (EGD) WITH PROPOFOL N/A 11/01/2015   Procedure: ESOPHAGOGASTRODUODENOSCOPY (EGD) WITH PROPOFOL;  Surgeon: Danie Binder, MD;  Location: [facility];  Service: Endoscopy;  Laterality: N/A;   KNEE ARTHROSCOPY     left   LAPAROTOMY N/A 07/25/2018   Procedure: EXPLORATORY LAPAROTOMY;  Surgeon: Rolm Bookbinder, MD;  Location: [facility];  Service: General;  Laterality: N/A;   PARTIAL COLECTOMY N/A 07/25/2018   Procedure: COLECTOMY;  Surgeon: Rolm Bookbinder, MD;  Location: [facility];  Service: General;  Laterality: N/A;   Right leg surgery  age 69   As a child for  ?scleroderma per patient   TONSILLECTOMY AND ADENOIDECTOMY     TOTAL KNEE ARTHROPLASTY Left 01/01/2022   Procedure: LEFT TOTAL KNEE ARTHROPLASTY;  Surgeon: Frederik Pear, MD;  Location: [facility];  Service: Orthopedics;  Laterality: Left;   TUBAL LIGATION     Ureteral surgery  2011   rt ureterostomy-stent     Social History:   reports that she quit smoking about 11 years ago. Her smoking use included cigarettes. She has a 100.00 pack-year smoking history. She has never used smokeless tobacco. She reports that she does not drink alcohol and does not use drugs.   Family History:  Her family history includes Cancer in her brother and father; Diabetes in her brother; Heart disease in her mother; Heart failure (age of  onset: 32) in her mother; Hypertension in her brother; Lung cancer (age of onset: 17) in her father. There is no history of Colon cancer or Liver disease.   Allergies Allergies  Allergen Reactions   Miralax [Polyethylene Glycol] Swelling and Rash    Took [facility] brand developed rash. Patient states she tolerated name brand MiraLax  in the past.  09/01/15. Patient states she had diffuse swelling including swelling of her lips.    Vancomycin Rash and Shortness Of Breath   Acetaminophen Hives   Banana Other (See Comments)    Upset stomach   Oxycodone-Acetaminophen Itching   Sulfa Antibiotics Rash    All over rash   Sulfacetamide Sodium Rash    All over rash   Sulfasalazine Rash    All over rash    Latex Rash   Oxycodone-Acetaminophen Rash   Sulfacetamide Sodium Rash   Sulfacetamide Sodium-Sulfur Rash   Tape Rash     Home Medications  Prior to Admission medications   Medication Sig Start Date End Date Taking? Authorizing Provider  ALPRAZolam (XANAX) 0.25 MG tablet Take 1 tablet (0.25 mg total) by mouth 3 (three) times daily as needed for anxiety. Please put prescription on file and cancel any old prescriptions. 05/28/22  Yes Dettinger, [name], MD  amLODipine (NORVASC) 5 MG tablet TAKE 1 TABLET(5 MG) BY MOUTH DAILY Patient taking differently: Take 5 mg by mouth daily. 10/30/21  Yes Minus Breeding, MD  atorvastatin (LIPITOR) 40 MG tablet TAKE 1 AND 1/2 TABLETS BY MOUTH EVERY DAY 06/15/22  Yes Minus Breeding, MD  dofetilide (TIKOSYN) 250 MCG capsule Take 1 capsule (250 mcg total) by mouth 2 (two) times daily. 12/22/21  Yes Loel Dubonnet, NP  furosemide (LASIX) 40 MG tablet Take 1 tablet (40 mg total) by mouth daily. 06/26/22 09/24/22 Yes Loel Dubonnet, NP  lisinopril (ZESTRIL) 20 MG tablet Take 1 tablet (20 mg total) by mouth daily. 10/04/21  Yes Dettinger, [name], MD  magnesium oxide (MAG-OX) 400 MG tablet Take 400 mg by mouth 2 (two) times daily.   Yes [provider]   metoprolol succinate (TOPROL-XL) 25 MG 24 hr tablet Take one tablet daily 02/23/22  Yes Dettinger, [name], MD  potassium chloride (KLOR-CON M) 10 MEQ tablet Take 1 tablet (10 mEq total) by mouth 2 (two) times daily. 06/26/22  Yes Loel Dubonnet, NP  warfarin (COUMADIN) 5 MG tablet TAKE 1 AND 1/2 TABLETS(7.5 MG) BY MOUTH DAILY Patient taking differently: Take 5 mg by mouth See admin instructions. TAKE 1 AND 1/2 TABLETS(7.5 MG) BY MOUTH on Monday and Thursday all other days take 5 mg 02/23/22  Yes Dettinger, [name], MD  hydroxychloroquine (PLAQUENIL) 200 MG tablet Take 1 tablet (200 mg total) by mouth 2 (two) times daily. Patient not taking: Reported on 06/26/2022 08/22/18   Cathlyn Parsons, PA-C     Critical care time:      Georgann Housekeeper, AGACNP-BC [facility]  See Amion for personal pager PCCM on call pager [phone] until 7pm. Please call [facility] 7p-7a. [phone]  07/03/2022 12:59 PM

## 2022-07-04 DIAGNOSIS — J90 Pleural effusion, not elsewhere classified: Secondary | ICD-10-CM | POA: Diagnosis not present

## 2022-07-04 LAB — CBC
HCT: 37 % (ref 36.0–46.0)
Hemoglobin: 12 g/dL (ref 12.0–15.0)
MCH: 28.6 pg (ref 26.0–34.0)
MCHC: 32.4 g/dL (ref 30.0–36.0)
MCV: 88.1 fL (ref 80.0–100.0)
Platelets: 216 10*3/uL (ref 150–400)
RBC: 4.2 MIL/uL (ref 3.87–5.11)
RDW: 15.2 % (ref 11.5–15.5)
WBC: 6.7 10*3/uL (ref 4.0–10.5)
nRBC: 0 % (ref 0.0–0.2)

## 2022-07-04 LAB — GLUCOSE, CAPILLARY
Glucose-Capillary: 120 mg/dL — ABNORMAL HIGH (ref 70–99)
Glucose-Capillary: 151 mg/dL — ABNORMAL HIGH (ref 70–99)
Glucose-Capillary: 148 mg/dL — ABNORMAL HIGH (ref 70–99)
Glucose-Capillary: 131 mg/dL — ABNORMAL HIGH (ref 70–99)

## 2022-07-04 LAB — BODY FLUID CULTURE W GRAM STAIN: Culture: NO GROWTH

## 2022-07-04 LAB — BRAIN NATRIURETIC PEPTIDE: B Natriuretic Peptide: 26.6 pg/mL (ref 0.0–100.0)

## 2022-07-04 LAB — PROTIME-INR
INR: 2.8 — ABNORMAL HIGH (ref 0.8–1.2)
Prothrombin Time: 28.9 seconds — ABNORMAL HIGH (ref 11.4–15.2)

## 2022-07-04 LAB — CYTOLOGY - NON PAP

## 2022-07-04 LAB — RHEUMATOID FACTOR: Rheumatoid fact SerPl-aCnc: >650 IU/mL — ABNORMAL HIGH (ref <14.0)

## 2022-07-04 LAB — SEDIMENTATION RATE: Sed Rate: 55 mm/hr — ABNORMAL HIGH (ref 0–22)

## 2022-07-04 MED ORDER — DIPHENHYDRAMINE HCL 25 MG PO CAPS
25.0000 mg | ORAL_CAPSULE | Freq: Four times a day (QID) | ORAL | Status: DC | PRN
Start: 2022-07-04 — End: 2022-07-09
  Administered 2022-07-04 – 2022-07-08 (×7): 25 mg via ORAL
  Filled 2022-07-04 (×7): qty 1

## 2022-07-04 NOTE — Progress Notes (Signed)
PT Cancellation Note  Patient Details Name: Melody Casey MRN: [id number] DOB: 1946/10/23   Cancelled Treatment:     Pt emotional sharing her latest news.  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  Pt waiting for daughter to come be with her.  Will continue to follow and attempt to see another day.     Rica Koyanagi  PTA Acute  Rehabilitation Services Office M-F          [phone] Weekend pager [phone]

## 2022-07-04 NOTE — Progress Notes (Signed)
[facility] for Warfarin  Indication: atrial fibrillation   Patient Measurements: Height: 5\' 7"  (170.2 cm) Weight: 75.9 kg (167 lb 5.3 oz) IBW/kg (Calculated) : 61.6 kg  Vital Signs: Temp: 98.9 F (37.2 C) (07/26 0600) Temp Source: Oral (07/26 0600) BP: 114/58 (07/26 0600) Pulse Rate: 74 (07/26 0600)  Labs: Recent Labs    07/02/22 0406 07/03/22 0601 07/04/22 0810  HGB 11.6* 12.5 12.0  HCT 36.8 39.7 37.0  PLT 196 212 216  LABPROT 26.3* 27.8*  --   INR 2.4* 2.6*  --   CREATININE 0.62  --   --      Estimated Creatinine Clearance: 61.6 mL/min (by C-G formula based on SCr of 0.62 mg/dL).  Assessment: 55 YOF presenting with SOB after recent dx of pleural effusion. She is on warfarin PTA for atrial fibrillation. Pharmacy consulted to dose warfarin while inpatient.  PTA regimen: 5 mg Sun/Tu/Wed/Fri/Sat; 7.5 mg Mon/Thur  Today, 07/04/2022: INR remains therapeutic @ 2.8 Per MD note 7/24 - "small amount of bright red blood in her brief & when she wiped (this is new for her)... wondering if this is hemorrhoids", none reported since CBC remains stable No major drug interactions with warfarin identified  Goal of Therapy:  INR 2-3 Monitor platelets by anticoagulation protocol: Yes   Plan:  Continue with warfarin at PTA regimen Decrease INR to MWF only   Eudelia Bunch, Pharm.D 07/04/2022 9:33 AM

## 2022-07-04 NOTE — Progress Notes (Addendum)
PROGRESS NOTE    Melody Casey  ACZ:[id number] DOB: Mar 03, 1946 DOA: 06/29/2022 PCP: Dettinger, [name], MD   Brief Narrative:  Melody Casey  is a 76 y.o. female, with past medical history of A-fib, on Tikosyn and warfarin, hypertension, scleroderma, CAD status post PCI.  Diverticulitis/perforated sigmoid colon with colostomy, 2 diabetes mellitus, history of breast cancer. S/p thoracentesis.  CT scan shows lung mass.    Assessment & Plan:   Principal Problem:   Pleural effusion Active Problems:   Dyslipidemia, goal LDL below 70   Essential hypertension   Type 2 diabetes mellitus with complication, without long-term current use of insulin (HCC)   CAD S/P percutaneous coronary angioplasty   History of scleroderma   Persistent atrial fibrillation (HCC)   Rheumatoid arthritis (HCC)   Colostomy present (HCC)   S/P total knee arthroplasty, left  Right Pleural effusion -thoracentesis on 06/30/2022.   - exudative with significant amount of neutrophils.  - repeat chest x-ray shows once again moderate to large right pleural effusion, it appears that she has reaccumulated the fluid.  -PCCM consult.  CT scan shows lung mass with adrenal masses-- EBUS on Friday  Chronic A-fib with RVR. -Heart rate controlled, continue with metoprolol, Tikosyn. -on warfarin for anticoagulation, pharmacy dosing, INR therapeutic.   Hypertension/CAD/hyperlipidemia: Blood pressure controlled.  Continue home medications which include amlodipine, lisinopril, Toprol-XL and atorvastatin. -Continue with home medications   History of scleroderma -Does not appear to be taking her hydroxy chloroquine anymore as she has not been to her physician for refill.  Acute blood loss anemia/1 episode of rectal bleed: On 07/02/2022, late in the morning, she was noted to have small amount of bright red blood in her brief & when she wiped (this is new for her).  Hemoglobin had dropped only slightly compared to admission but then  improved today.  Wondering if this is hemorrhoids.  She had no abdominal complaint today.   DVT prophylaxis:   Coumadin.   Code Status: Full Code  Family Communication:  None present at bedside.  Status is: Inpatient Remains inpatient appropriate because: Needs evaluation by PCCM, has reaccumulation of right pleural effusion.  Nutritional Assessment: Body mass index is 26.21 kg/m.Marland Kitchen Seen by dietician.  I agree with the assessment and plan as outlined below: Nutrition Status: Nutrition Problem: Inadequate oral intake Etiology: decreased appetite Signs/Symptoms: per patient/family report Interventions: Ensure Enlive (each supplement provides 350kcal and 20 grams of protein)  .  Consultants:  IR PCCM   Subjective:  No current complaints  Objective: Vitals:   07/03/22 2103 07/03/22 2213 07/04/22 0600 07/04/22 1139  BP: (!) 126/54  (!) 114/58   Pulse: 75  74   Resp: 17  17   Temp: (!) 97.5 F (36.4 C) 98 F (36.7 C) 98.9 F (37.2 C)   TempSrc: Oral Oral Oral   SpO2: 96%  (!) 89% 92%  Weight:      Height:        Intake/Output Summary (Last 24 hours) at 07/04/2022 1150 Last data filed at 07/03/2022 1756 Gross per 24 hour  Intake 520 ml  Output --  Net 520 ml   Filed Weights   06/29/22 1454 06/30/22 0600  Weight: 77.3 kg 75.9 kg    Examination:   General: Appearance:     Overweight female in no acute distress     Lungs:     Diminished, respirations unlabored  Heart:    Normal heart rate. Irregularly irregular rhythm. No murmurs, rubs, or gallops.  MS:   All extremities are intact.   Neurologic:   Awake, alert     Data Reviewed: I have personally reviewed following labs and imaging studies  CBC: Recent Labs  Lab 06/29/22 1514 06/30/22 0455 07/01/22 0406 07/02/22 0406 07/03/22 0601 07/04/22 0810  WBC 5.0 4.7 5.5 6.1 5.9 6.7  NEUTROABS 3.7  --   --   --   --   --   HGB 13.0 12.4 11.7* 11.6* 12.5 12.0  HCT 40.7 38.6 36.6 36.8 39.7 37.0  MCV 87.5  88.1 88.6 89.5 89.6 88.1  PLT 234 207 204 196 212 086   Basic Metabolic Panel: Recent Labs  Lab 06/29/22 1514 06/30/22 0455 07/01/22 0406 07/02/22 0406  NA 136 138 137 135  K 3.6 3.5 4.3 4.2  CL 104 106 107 107  CO2 25 26 25 22   GLUCOSE 156* 114* 117* 113*  BUN 15 13 14 16   CREATININE 0.64 0.59 0.62 0.62  CALCIUM 9.9 9.9 10.1 9.8   GFR: Estimated Creatinine Clearance: 61.6 mL/min (by C-G formula based on SCr of 0.62 mg/dL). Liver Function Tests: Recent Labs  Lab 06/30/22 0455 07/01/22 0406  AST 17 16  ALT 15 13  ALKPHOS 76 74  BILITOT 0.7 0.7  PROT 6.5 6.4*  ALBUMIN 3.0* 2.8*   No results for input(s): "LIPASE", "AMYLASE" in the last 168 hours. No results for input(s): "AMMONIA" in the last 168 hours. Coagulation Profile: Recent Labs  Lab 06/30/22 0455 07/01/22 0406 07/02/22 0406 07/03/22 0601 07/04/22 0810  INR 2.5* 2.5* 2.4* 2.6* 2.8*   Cardiac Enzymes: No results for input(s): "CKTOTAL", "CKMB", "CKMBINDEX", "TROPONINI" in the last 168 hours. BNP (last 3 results) No results for input(s): "PROBNP" in the last 8760 hours. HbA1C: No results for input(s): "HGBA1C" in the last 72 hours. CBG: Recent Labs  Lab 07/02/22 2040 07/03/22 0735 07/03/22 1655 07/03/22 2201 07/04/22 0732  GLUCAP 148* 116* 224* 167* 120*   Lipid Profile: No results for input(s): "CHOL", "HDL", "LDLCALC", "TRIG", "CHOLHDL", "LDLDIRECT" in the last 72 hours. Thyroid Function Tests: No results for input(s): "TSH", "T4TOTAL", "FREET4", "T3FREE", "THYROIDAB" in the last 72 hours. Anemia Panel: No results for input(s): "VITAMINB12", "FOLATE", "FERRITIN", "TIBC", "IRON", "RETICCTPCT" in the last 72 hours. Sepsis Labs: Recent Labs  Lab 07/01/22 0406  PROCALCITON 0.17    Recent Results (from the past 240 hour(s))  Body fluid culture w Gram Stain     Status: None   Collection Time: 06/30/22  2:00 PM   Specimen: PATH Cytology Pleural fluid  Result Value Ref Range Status    Specimen Description   Final    PLEURAL Performed at [facility] [address], [place], [place] [address]    Special Requests   Final    PLEURAL Performed at [facility], [place] [address] [place], [place] [address]    Gram Stain   Final    FEW WBC PRESENT, PREDOMINANTLY MONONUCLEAR NO ORGANISMS SEEN    Culture   Final    NO GROWTH 3 DAYS Performed at [facility] [address] [place], [place] [address]    Report Status 07/04/2022 FINAL  Final  Body fluid culture w Gram Stain     Status: None (Preliminary result)   Collection Time: 07/03/22  4:09 PM   Specimen: PATH Cytology Pleural fluid  Result Value Ref Range Status   Specimen Description   Final    PLEURAL Performed at [facility], [address] [place], [place] [address]    Special Requests   Final    NONE Performed at [facility], [place] [address], [place], [place] [address]    Gram Stain NO WBC SEEN NO ORGANISMS SEEN   Final   Culture   Final    NO GROWTH < 24 HOURS Performed at [facility], [place] [address] [place], [place] [address]    Report Status PENDING  Incomplete     Radiology Studies: US THORACENTESIS ASP PLEURAL SPACE W/IMG GUIDE  Result Date: 07/04/2022 INDICATION: Pleural effusion, history of breast cancer, shortness of breath. Request for diagnostic and therapeutic right thoracentesis. EXAM: ULTRASOUND GUIDED DIAGNOSTIC AND THERAPEUTIC RIGHT THORACENTESIS MEDICATIONS: 10 mL 1 % lidocaine COMPLICATIONS: None immediate. PROCEDURE: An ultrasound guided thoracentesis was thoroughly discussed with the patient and questions answered. The benefits, risks, alternatives and complications were also discussed. The patient understands and wishes to proceed with the procedure. Written consent was obtained. Ultrasound was performed to localize and mark an adequate pocket of fluid in the right chest. The area was then  prepped and draped in the normal sterile fashion. 1% Lidocaine was used for local anesthesia. Under ultrasound guidance a 6 Fr Safe-T-Centesis catheter was introduced. Thoracentesis was performed. The catheter was removed and a dressing applied. FINDINGS: A total of approximately 2.5 L of serosanguineous fluid was removed. Samples were sent to the laboratory as requested by the clinical team. IMPRESSION: Successful ultrasound guided right thoracentesis yielding 2.5 L of pleural fluid. Read by: Narda Rutherford, AGNP-BC Electronically Signed   By: Miachel Roux M.D.   On: 07/04/2022 07:35   CT CHEST WO CONTRAST  Result Date: 07/03/2022 CLINICAL DATA:  Pleural effusion.  History of breast cancer. EXAM: CT CHEST WITHOUT CONTRAST TECHNIQUE: Multidetector CT imaging of the chest was performed following the standard protocol without IV contrast. RADIATION DOSE REDUCTION: This exam was performed according to the departmental dose-optimization program which includes automated exposure control, adjustment of the mA and/or kV according to patient size and/or use of iterative reconstruction technique. COMPARISON:  None Available. FINDINGS: Cardiovascular: Heart is normal in size. Small volume pericardial effusion. No evidence of thoracic aortic aneurysm. Atherosclerotic calcifications of the aortic arch. Coronary atherosclerosis of the LAD and left circumflex. Mediastinum/Nodes: Bulky mediastinal lymphadenopathy, including a 2.8 cm short axis right paratracheal node (series 2/image 85) and a 4.5 cm subcarinal nodal mass. Tumor likely extends to the right hilar region, although this is poorly evaluated on unenhanced CT. Visualized thyroid is unremarkable. Lungs/Pleura: Endobronchial soft tissue occluding the right lower lobe bronchus (series 2/image 33). Associated masslike right lower lobe opacity, likely reflecting a combination of central tumor and postobstructive atelectasis/collapse, although ill-defined on unenhanced CT. A  central tumor component of 5.6 cm is suspected (series 2/image 87). Complete right middle lobe atelectasis/collapse. Trace residual right pleural effusion. Associated pleural-based nodularity (series 2/images 50, 72, and 75), reflecting pleural metastases. 6 mm right upper lobe nodule (series 5/image 69), indeterminate. Left lung is clear. Mild centrilobular emphysematous changes. No pneumothorax. Upper Abdomen: Visualized upper abdomen is notable for 12 mm right adrenal nodule (series 2/image 150), new from 2020 CT abdomen/pelvis, suspicious for metastasis. Vascular calcifications. Musculoskeletal: Mild degenerative changes of the lower thoracic spine. IMPRESSION: Suspected large right lower lobe mass, poorly evaluated on unenhanced CT, with associated right middle and lower lobe atelectasis/collapse. This appearance is suspicious for primary bronchogenic neoplasm. Bulky mediastinal nodal metastases. Residual small right pleural effusion with pleural-based metastases. Suspected small right  adrenal metastasis. Aortic Atherosclerosis (ICD10-I70.0) and Emphysema (ICD10-J43.9). Electronically Signed   By: Julian Hy M.D.   On: 07/03/2022 19:34   DG Chest Port 1 View  Result Date: 07/03/2022 CLINICAL DATA:  Post right thoracentesis. EXAM: PORTABLE CHEST 1 VIEW COMPARISON:  07/02/2022 FINDINGS: Examination demonstrates interval improvement in patient's right-sided pleural effusion with residual small to moderate effusion present. No pneumothorax. Left lung unchanged. Cardiomediastinal silhouette and remainder of the exam is unchanged. IMPRESSION: Significant interval improvement in patient's right pleural effusion post thoracentesis. No pneumothorax. Electronically Signed   By: Marin Olp M.D.   On: 07/03/2022 16:37   DG CHEST PORT 1 VIEW  Result Date: 07/02/2022 CLINICAL DATA:  Pleural effusion EXAM: PORTABLE CHEST 1 VIEW COMPARISON:  06/30/2022 FINDINGS: Stable heart size. Aortic atherosclerosis.  Moderate-large right pleural effusion with associated right basilar opacity, similar to prior. Left lung appears clear. No pneumothorax. IMPRESSION: Moderate-large right pleural effusion with associated right basilar opacity, similar to prior. Electronically Signed   By: Davina Poke D.O.   On: 07/02/2022 14:12    Scheduled Meds:  amLODipine  5 mg Oral Daily   atorvastatin  60 mg Oral Daily   dofetilide  250 mcg Oral BID   furosemide  40 mg Oral Daily   lisinopril  20 mg Oral Daily   magnesium oxide  400 mg Oral BID   metoprolol succinate  25 mg Oral Daily   potassium chloride  10 mEq Oral BID   warfarin  7.5 mg Oral Once per day on Mon Thu   And   warfarin  5 mg Oral Once per day on Sun Tue Wed Fri Sat   Warfarin - Pharmacist Dosing Inpatient   Does not apply q1600   Continuous Infusions:  ceFEPime (MAXIPIME) IV 2 g (07/04/22 0516)     LOS: 2 days   Geradine Girt, DO [facility]  07/04/2022, 11:50 AM

## 2022-07-04 NOTE — Progress Notes (Signed)
OT Cancellation Note  Patient Details Name: Melody Casey MRN: [id number] DOB: April 08, 1946   Cancelled Treatment:    Reason Eval/Treat Not Completed: Other (comment). Patient declines therapy and states "I don't want to do anything." She is distracted and nervious about potential medical diagnosis.  Damare Serano L Kynzleigh Bandel 07/04/2022, 9:22 AM

## 2022-07-04 NOTE — Progress Notes (Signed)
Assumed care of patient from previous nurse, agree with previous nurses assessment.

## 2022-07-04 NOTE — Consult Note (Signed)
NAME:  Melody Casey, MRN:  [id number], DOB:  06-03-1946, LOS: 2 ADMISSION DATE:  06/29/2022, CONSULTATION DATE: 7/25 REFERRING MD: Dr. Doristine Bosworth, CHIEF COMPLAINT: Pleural effusion  brief  76 year old female with past medical history as below, which is significant for A-fib on Tikosyn and warfarin, hypertension, scleroderma, and coronary artery disease.  Recent course significant for pneumonia on the tail end of May treated with antibiotics and she seemed to recover adequately.  But then did develop progressive shortness of breath which was slowly progressive over period of weeks ultimately prompting her to present to [place] on 7/17, but left without being seen.  She has been taking Lasix with no improvement in symptoms.  She presented to [facility] emergency department on 7/21 with complains again of progressive shortness of breath.  Chest x-ray demonstrated a pleural effusion, which was known, however, was larger.  She was admitted to the hospitalist service at [facility] and underwent thoracentesis.  She had significant symptomatic relief status post thoracentesis.  Pleural fluid consistent with exudative process and neutrophil predominance.  Unfortunately the pleural effusion recurred and PCCM was consulted on 7/25 for further management.  Pertinent  Medical History   has a past medical history of AAA (abdominal aortic aneurysm) ([place]), Acute diverticulitis, Anxiety, Arthritis, Atrial fibrillation ([place]), CAD (coronary artery disease), Cancer ([place]), Diabetes mellitus, Diverticulitis, Diverticulitis of colon ( ), Dysrhythmia, Foot deformity (1947), Hemangioma, HLD (hyperlipidemia), HTN (hypertension), Osteoporosis, Tobacco abuse, Ureteral obstruction, and Wears dentures.   reports that she quit smoking about 11 years ago. Her smoking use included cigarettes. She has a 100.00 pack-year smoking history. She has never used smokeless tobacco.   Significant Hospital Events: Including  procedures, antibiotic start and stop dates in addition to other pertinent events     Interim History / Subjective:   7/26 - cT chest with RLL Mass, Significant mediastinal adenompaty. 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 She is upset. Her husband  just dided at [employer] in June 2023 and she is still in grief. Daughter and son lve in [place] but are visiting. No new compliants. Feels deconditioned.   Is on coumadin for A Fib - INr > 2 Has cough  is on ace inihibitor   Dr Lamonte Sakai can do EBUS Friday 07/06/22 - patient posted for this   Objective   Blood pressure (!) 114/58, pulse 74, temperature 98.9 F (37.2 C), temperature source Oral, resp. rate 17, height 5' 7"  (1.702 m), weight 75.9 kg, SpO2 92 %.        Intake/Output Summary (Last 24 hours) at 07/04/2022 1450 Last data filed at 07/03/2022 1756 Gross per 24 hour  Intake 520 ml  Output --  Net 520 ml   Filed Weights   06/29/22 1454 06/30/22 0600  Weight: 77.3 kg 75.9 kg    Examination: General Appearance:  Looks frail. Deconditioned Head:  Normocephalic, without obvious abnormality, atraumatic Eyes:  PERRL - yes, conjunctiva/corneas - mydd     Ears:  Normal external ear canals, both ears Nose:  G tube - no Throat:  ETT TUBE - no , OG tube - no Neck:  Supple,  No enlargement/tenderness/nodules Lungs: Clear to auscultation bilaterally, mild barrel chest Heart:  S1 and S2 normal, no murmur, CVP - no.  Pressors - no Abdomen:  Soft, no masses, no organomegaly Genitalia / Rectal:  Not done Extremities:  Extremities- intact Skin:  ntact in exposed areas . Sacral area - intac Neurologic:  Sedation -  none -> RASS - +! Marland Kitchen Moves all 4s - yes. CAM-ICU - neg . Orientation - x3+      Resolved Hospital Problem list     Assessment & Plan:  100 pack smoking history - Present on Admit  Pleural effusion on the right: - Present on Admit - Status post thoracentesis on 7/22  and 7/25: Lymphoticyti exudate  Diagnosis of RLL Mass  bulky medastinal adenopaty, pleural mets and adrenal mets  on 7/25 - concern for lung cacner - Present on Admit   Plan  - await pleural fluid cytology  - Bronch/EBUS by  BYrum on 07/06/22 at [facility] posted  - Risks of pneumothorax, hemothorax, sedation/anesthesia complications such as cardiac or respiratory arrest or hypotension, stroke and bleeding all explained. Benefits of diagnosis but limitations of non-diagnosis also explained. Patient verbalized understanding and wished to proceed.   - stop coumadin  - check daily INR  - might get cancelled depending on pleural flujids results  - wil need NPO from 2 or 3am 07/06/22 Friday - Get MRI brain - if effusion recurs (and likely will) will need PleurX -dc ace inhibiutor (cough)    - can go home but depends on triad MD  Best Practice (right click and "Reselect all SmartList Selections" daily)   Diet/type: Regular consistency (see orders) DVT prophylaxis: Coumadin -. STOP 07/05/22 GI prophylaxis:  Lines: N/A Foley:  N/A Code Status:  full code Last date of multidisciplinary goals of care discussion [  ]  > 50 min in discussio9n and care wth patient, daughter and Dr Jeannene Patella    Dr. Brand Males, M.D., F.C.C.P,  Pulmonary and Critical Care Medicine Staff Physician, [place] Director - Interstitial Lung Disease  Program  Medical Director - [facility] [place] at [place], [place], [address]  NPI Number:  NPI #[id number] [place] Number: [id number]  Pager: [phone], If no answer  -> Check AMION or Try [phone] Telephone (clinical office): [phone] Telephone (research): [phone]  2:50 PM 07/04/2022    LABS    PULMONARY No results for input(s): "PHART", "PCO2ART", "PO2ART", "HCO3", "TCO2", "O2SAT" in the last 168 hours.  Invalid input(s): "PCO2", "PO2"  CBC Recent Labs  Lab 07/02/22 0406 07/03/22 0601 07/04/22 0810   HGB 11.6* 12.5 12.0  HCT 36.8 39.7 37.0  WBC 6.1 5.9 6.7  PLT 196 212 216    COAGULATION Recent Labs  Lab 06/30/22 0455 07/01/22 0406 07/02/22 0406 07/03/22 0601 07/04/22 0810  INR 2.5* 2.5* 2.4* 2.6* 2.8*    CARDIAC  No results for input(s): "TROPONINI" in the last 168 hours. No results for input(s): "PROBNP" in the last 168 hours.   CHEMISTRY Recent Labs  Lab 06/29/22 1514 06/30/22 0455 07/01/22 0406 07/02/22 0406  NA 136 138 137 135  K 3.6 3.5 4.3 4.2  CL 104 106 107 107  CO2 25 26 25 22   GLUCOSE 156* 114* 117* 113*  BUN 15 13 14 16   CREATININE 0.64 0.59 0.62 0.62  CALCIUM 9.9 9.9 10.1 9.8   Estimated Creatinine Clearance: 61.6 mL/min (by C-G formula based on SCr of 0.62 mg/dL).   LIVER Recent Labs  Lab 06/30/22 0455 07/01/22 0406 07/02/22 0406 07/03/22 0601 07/04/22 0810  AST 17 16  --   --   --   ALT 15 13  --   --   --   ALKPHOS 76 74  --   --   --  BILITOT 0.7 0.7  --   --   --   PROT 6.5 6.4*  --   --   --   ALBUMIN 3.0* 2.8*  --   --   --   INR 2.5* 2.5* 2.4* 2.6* 2.8*     INFECTIOUS Recent Labs  Lab 07/01/22 0406  PROCALCITON 0.17     ENDOCRINE CBG (last 3)  Recent Labs    07/03/22 2201 07/04/22 0732 07/04/22 1156  GLUCAP 167* 120* 151*         IMAGING x48h  - image(s) personally visualized  -   highlighted in bold US THORACENTESIS ASP PLEURAL SPACE W/IMG GUIDE  Result Date: 07/04/2022 INDICATION: Pleural effusion, history of breast cancer, shortness of breath. Request for diagnostic and therapeutic right thoracentesis. EXAM: ULTRASOUND GUIDED DIAGNOSTIC AND THERAPEUTIC RIGHT THORACENTESIS MEDICATIONS: 10 mL 1 % lidocaine COMPLICATIONS: None immediate. PROCEDURE: An ultrasound guided thoracentesis was thoroughly discussed with the patient and questions answered. The benefits, risks, alternatives and complications were also discussed. The patient understands and wishes to proceed with the procedure. Written consent was  obtained. Ultrasound was performed to localize and mark an adequate pocket of fluid in the right chest. The area was then prepped and draped in the normal sterile fashion. 1% Lidocaine was used for local anesthesia. Under ultrasound guidance a 6 Fr Safe-T-Centesis catheter was introduced. Thoracentesis was performed. The catheter was removed and a dressing applied. FINDINGS: A total of approximately 2.5 L of serosanguineous fluid was removed. Samples were sent to the laboratory as requested by the clinical team. IMPRESSION: Successful ultrasound guided right thoracentesis yielding 2.5 L of pleural fluid. Read by: Narda Rutherford, AGNP-BC Electronically Signed   By: Miachel Roux M.D.   On: 07/04/2022 07:35   CT CHEST WO CONTRAST  Result Date: 07/03/2022 CLINICAL DATA:  Pleural effusion.  History of breast cancer. EXAM: CT CHEST WITHOUT CONTRAST TECHNIQUE: Multidetector CT imaging of the chest was performed following the standard protocol without IV contrast. RADIATION DOSE REDUCTION: This exam was performed according to the departmental dose-optimization program which includes automated exposure control, adjustment of the mA and/or kV according to patient size and/or use of iterative reconstruction technique. COMPARISON:  None Available. FINDINGS: Cardiovascular: Heart is normal in size. Small volume pericardial effusion. No evidence of thoracic aortic aneurysm. Atherosclerotic calcifications of the aortic arch. Coronary atherosclerosis of the LAD and left circumflex. Mediastinum/Nodes: Bulky mediastinal lymphadenopathy, including a 2.8 cm short axis right paratracheal node (series 2/image 85) and a 4.5 cm subcarinal nodal mass. Tumor likely extends to the right hilar region, although this is poorly evaluated on unenhanced CT. Visualized thyroid is unremarkable. Lungs/Pleura: Endobronchial soft tissue occluding the right lower lobe bronchus (series 2/image 33). Associated masslike right lower lobe opacity, likely  reflecting a combination of central tumor and postobstructive atelectasis/collapse, although ill-defined on unenhanced CT. A central tumor component of 5.6 cm is suspected (series 2/image 87). Complete right middle lobe atelectasis/collapse. Trace residual right pleural effusion. Associated pleural-based nodularity (series 2/images 50, 72, and 75), reflecting pleural metastases. 6 mm right upper lobe nodule (series 5/image 69), indeterminate. Left lung is clear. Mild centrilobular emphysematous changes. No pneumothorax. Upper Abdomen: Visualized upper abdomen is notable for 12 mm right adrenal nodule (series 2/image 150), new from 2020 CT abdomen/pelvis, suspicious for metastasis. Vascular calcifications. Musculoskeletal: Mild degenerative changes of the lower thoracic spine. IMPRESSION: Suspected large right lower lobe mass, poorly evaluated on unenhanced CT, with associated right middle and lower lobe atelectasis/collapse. This appearance is  suspicious for primary bronchogenic neoplasm. Bulky mediastinal nodal metastases. Residual small right pleural effusion with pleural-based metastases. Suspected small right adrenal metastasis. Aortic Atherosclerosis (ICD10-I70.0) and Emphysema (ICD10-J43.9). Electronically Signed   By: Julian Hy M.D.   On: 07/03/2022 19:34   DG Chest Port 1 View  Result Date: 07/03/2022 CLINICAL DATA:  Post right thoracentesis. EXAM: PORTABLE CHEST 1 VIEW COMPARISON:  07/02/2022 FINDINGS: Examination demonstrates interval improvement in patient's right-sided pleural effusion with residual small to moderate effusion present. No pneumothorax. Left lung unchanged. Cardiomediastinal silhouette and remainder of the exam is unchanged. IMPRESSION: Significant interval improvement in patient's right pleural effusion post thoracentesis. No pneumothorax. Electronically Signed   By: Marin Olp M.D.   On: 07/03/2022 16:37   Mrs

## 2022-07-05 ENCOUNTER — Inpatient Hospital Stay (HOSPITAL_COMMUNITY): Payer: Medicare Other

## 2022-07-05 ENCOUNTER — Other Ambulatory Visit (HOSPITAL_COMMUNITY): Payer: Self-pay

## 2022-07-05 ENCOUNTER — Telehealth (HOSPITAL_COMMUNITY): Payer: Self-pay | Admitting: Pharmacy Technician

## 2022-07-05 DIAGNOSIS — R590 Localized enlarged lymph nodes: Secondary | ICD-10-CM

## 2022-07-05 DIAGNOSIS — J90 Pleural effusion, not elsewhere classified: Secondary | ICD-10-CM | POA: Diagnosis not present

## 2022-07-05 LAB — BLOOD GAS, ARTERIAL
Acid-base deficit: 3.8 mmol/L — ABNORMAL HIGH (ref 0.0–2.0)
Bicarbonate: 20.7 mmol/L (ref 20.0–28.0)
O2 Saturation: 94.1 %
Patient temperature: 37
pCO2 arterial: 35 mmHg (ref 32–48)
pH, Arterial: 7.38 (ref 7.35–7.45)
pO2, Arterial: 67 mmHg — ABNORMAL LOW (ref 83–108)

## 2022-07-05 LAB — COMPREHENSIVE METABOLIC PANEL
ALT: 10 U/L (ref 0–44)
AST: 13 U/L — ABNORMAL LOW (ref 15–41)
Albumin: 2.4 g/dL — ABNORMAL LOW (ref 3.5–5.0)
Alkaline Phosphatase: 57 U/L (ref 38–126)
Anion gap: 5 (ref 5–15)
BUN: 34 mg/dL — ABNORMAL HIGH (ref 8–23)
CO2: 20 mmol/L — ABNORMAL LOW (ref 22–32)
Calcium: 9.6 mg/dL (ref 8.9–10.3)
Chloride: 110 mmol/L (ref 98–111)
Creatinine, Ser: 0.86 mg/dL (ref 0.44–1.00)
GFR, Estimated: >60 mL/min (ref >60)
Glucose, Bld: 126 mg/dL — ABNORMAL HIGH (ref 70–99)
Potassium: 4.8 mmol/L (ref 3.5–5.1)
Sodium: 135 mmol/L (ref 135–145)
Total Bilirubin: 0.7 mg/dL (ref 0.3–1.2)
Total Protein: 5.7 g/dL — ABNORMAL LOW (ref 6.5–8.1)

## 2022-07-05 LAB — GLUCOSE, CAPILLARY
Glucose-Capillary: 116 mg/dL — ABNORMAL HIGH (ref 70–99)
Glucose-Capillary: 167 mg/dL — ABNORMAL HIGH (ref 70–99)
Glucose-Capillary: 137 mg/dL — ABNORMAL HIGH (ref 70–99)

## 2022-07-05 LAB — CBC
HCT: 37.9 % (ref 36.0–46.0)
Hemoglobin: 11.9 g/dL — ABNORMAL LOW (ref 12.0–15.0)
MCH: 28.1 pg (ref 26.0–34.0)
MCHC: 31.4 g/dL (ref 30.0–36.0)
MCV: 89.6 fL (ref 80.0–100.0)
Platelets: 220 10*3/uL (ref 150–400)
RBC: 4.23 MIL/uL (ref 3.87–5.11)
RDW: 15.5 % (ref 11.5–15.5)
WBC: 6.9 10*3/uL (ref 4.0–10.5)
nRBC: 0 % (ref 0.0–0.2)

## 2022-07-05 LAB — ANTI-SCLERODERMA ANTIBODY: Scleroderma (Scl-70) (ENA) Antibody, IgG: <0.2 AI (ref 0.0–0.9)

## 2022-07-05 LAB — PROTIME-INR
INR: 2.7 — ABNORMAL HIGH (ref 0.8–1.2)
Prothrombin Time: 28.8 seconds — ABNORMAL HIGH (ref 11.4–15.2)

## 2022-07-05 LAB — CYCLIC CITRUL PEPTIDE ANTIBODY, IGG/IGA: CCP Antibodies IgG/IgA: 4 units (ref 0–19)

## 2022-07-05 LAB — ANA: Anti Nuclear Antibody (ANA): POSITIVE — AB

## 2022-07-05 MED ORDER — VITAMIN K1 10 MG/ML IJ SOLN
5.0000 mg | Freq: Once | INTRAMUSCULAR | Status: DC
  Filled 2022-07-05: qty 0.5

## 2022-07-05 MED ORDER — VITAMIN K1 10 MG/ML IJ SOLN
5.0000 mg | Freq: Once | INTRAVENOUS | Status: AC
  Administered 2022-07-05: 5 mg via INTRAVENOUS
  Filled 2022-07-05: qty 0.5

## 2022-07-05 MED ORDER — HYDROCORTISONE 0.5 % EX CREA
TOPICAL_CREAM | Freq: Three times a day (TID) | CUTANEOUS | Status: DC
  Filled 2022-07-05: qty 28.35

## 2022-07-05 MED ORDER — SODIUM CHLORIDE 0.9% FLUSH: 10.0000 mL | INTRAVENOUS | Status: DC | PRN

## 2022-07-05 MED ORDER — HYDROCORTISONE 0.5 % EX CREA
TOPICAL_CREAM | Freq: Three times a day (TID) | CUTANEOUS | Status: DC | PRN
  Filled 2022-07-05: qty 28.35

## 2022-07-05 MED ORDER — GADOBUTROL 1 MMOL/ML IV SOLN
7.0000 mL | Freq: Once | INTRAVENOUS | Status: AC | PRN
  Administered 2022-07-05: 7 mL via INTRAVENOUS

## 2022-07-05 MED ORDER — METOPROLOL SUCCINATE ER 25 MG PO TB24
12.5000 mg | ORAL_TABLET | Freq: Every day | ORAL | Status: DC
  Administered 2022-07-06 – 2022-07-09 (×4): 12.5 mg via ORAL
  Filled 2022-07-05 (×5): qty 1

## 2022-07-05 MED ORDER — SODIUM CHLORIDE 0.9% FLUSH
10.0000 mL | Freq: Two times a day (BID) | INTRAVENOUS | Status: DC
  Administered 2022-07-05 – 2022-07-08 (×7): 10 mL

## 2022-07-05 NOTE — TOC Progression Note (Signed)
Transition of Care [facility]) - Progression Note    Patient Details  Name: Melody Casey MRN: [id number] Date of Birth: [birth year]  Transition of [facility]) CM/SW Contact  Leeroy Cha, RN Phone Number: 07/05/2022, 9:16 AM  Clinical Narrative:    [phone] chart reviewed.  Following for toc needs.  Plan is to be determined based on outcome of bx and ct scan on 072623.  Rt lll mass. Hx of smoker.   Expected Discharge Plan: Home/Self Care Barriers to Discharge: Continued Medical Work up  Expected Discharge Plan and Services Expected Discharge Plan: Home/Self Care   Discharge Planning Services: CM Consult   Living arrangements for the past 2 months: Single Family Home                                       Social Determinants of Health (SDOH) Interventions    Readmission Risk Interventions     No data to display

## 2022-07-05 NOTE — Care Management Important Message (Signed)
Important Message  Patient Details IM Letter given to the Patient. Name: Melody Casey MRN: [id number] Date of Birth: 1946-11-28   Medicare Important Message Given:  Yes     Kerin Salen 07/05/2022, 11:44 AM

## 2022-07-05 NOTE — TOC Benefit Eligibility Note (Signed)
Patient Advocate Encounter  Insurance verification completed.    The patient is currently admitted and upon discharge could be taking Eliquis 5 mg.  The current 30 day co-pay is $47.00.   The patient is currently admitted and upon discharge could be taking Xarelto 20 mg.  The current 30 day co-pay is $47.00.   The patient is insured through [employer] Medicare Part D     Leeandra Ellerson, CPhT Pharmacy Patient Advocate Specialist [facility] Patient Advocate Team Direct Number: [phone]  Fax: [phone]        

## 2022-07-05 NOTE — Progress Notes (Signed)
PROGRESS NOTE    Melody Casey  ENI:[id number] DOB: September 18, 1946 DOA: 06/29/2022 PCP: Dettinger, [name], MD   Brief Narrative:  Melody Casey  is a 76 y.o. female, with past medical history of A-fib, on Tikosyn and warfarin, hypertension, scleroderma, CAD status post PCI.  Diverticulitis/perforated sigmoid colon with colostomy, 2 diabetes mellitus, history of breast cancer. S/p thoracentesis.  CT scan shows lung mass.  Getting EBUS for definitive diagnosis    Assessment & Plan:   Principal Problem:   Pleural effusion Active Problems:   Dyslipidemia, goal LDL below 70   Essential hypertension   Type 2 diabetes mellitus with complication, without long-term current use of insulin (HCC)   CAD S/P percutaneous coronary angioplasty   History of scleroderma   Persistent atrial fibrillation (HCC)   Rheumatoid arthritis (HCC)   Colostomy present (HCC)   S/P total knee arthroplasty, left   Right Pleural effusion -thoracentesis on 06/30/2022.   - exudative with significant amount of neutrophils.  - repeat chest x-ray shows once again moderate to large right pleural effusion, it appears that she has reaccumulated the fluid.  -PCCM consult.  CT scan shows lung mass with adrenal masses-- EBUS on Friday -MRI brain negative -have stopped her IV abx due to rash-- benadryl improving-- may do trial of steroids  Chronic A-fib with RVR. -Heart rate controlled, continue with metoprolol, Tikosyn. -warfarin held for EBUS-- will discuss changing to eliquis with patient and family   Hypertension/CAD/hyperlipidemia: Blood pressure controlled.  Continue home medications which include amlodipine, lisinopril, Toprol-XL and atorvastatin. -holding home meds   History of scleroderma -Does not appear to be taking her hydroxy chloroquine anymore as she has not been to her physician for refill.     DVT prophylaxis:   coumadin on hold   Code Status: Full Code  Family Communication:  daughter at  bedside  Status is: Inpatient Remains inpatient appropriate because: EBUS  Nutritional Assessment: Body mass index is 26.21 kg/m.Marland Kitchen Seen by dietician.  I agree with the assessment and plan as outlined below: Nutrition Status: Nutrition Problem: Inadequate oral intake Etiology: decreased appetite Signs/Symptoms: per patient/family report Interventions: Ensure Enlive (each supplement provides 350kcal and 20 grams of protein)  .  Consultants:  IR PCCM   Subjective:  Has a rash that started on Monday or Tuesday  Objective: Vitals:   07/04/22 2100 07/04/22 2112 07/05/22 0406 07/05/22 1035  BP: 109/61 109/61 (!) 106/58 (!) 92/55  Pulse: 72 72 73 82  Resp: 16 16 16    Temp: 97.9 F (36.6 C) 97.9 F (36.6 C) 98.2 F (36.8 C)   TempSrc: Oral Oral Oral   SpO2: 98% 98% 93%   Weight:      Height:        Intake/Output Summary (Last 24 hours) at 07/05/2022 1239 Last data filed at 07/05/2022 1047 Gross per 24 hour  Intake 1386.22 ml  Output --  Net 1386.22 ml   Filed Weights   06/29/22 1454 06/30/22 0600  Weight: 77.3 kg 75.9 kg    Examination:   General: Appearance:     Overweight female in no acute distress     Lungs:     Diminished, respirations unlabored  Heart:    Normal heart rate. Irregularly irregular rhythm.   MS:   All extremities are intact.   Neurologic:   Awake, alert     Data Reviewed: I have personally reviewed following labs and imaging studies  CBC: Recent Labs  Lab 06/29/22 1514 06/30/22 0455 07/01/22  0406 07/02/22 0406 07/03/22 0601 07/04/22 0810 07/05/22 0427  WBC 5.0   < > 5.5 6.1 5.9 6.7 6.9  NEUTROABS 3.7  --   --   --   --   --   --   HGB 13.0   < > 11.7* 11.6* 12.5 12.0 11.9*  HCT 40.7   < > 36.6 36.8 39.7 37.0 37.9  MCV 87.5   < > 88.6 89.5 89.6 88.1 89.6  PLT 234   < > 204 196 212 216 220   < > = values in this interval not displayed.   Basic Metabolic Panel: Recent Labs  Lab 06/29/22 1514 06/30/22 0455 07/01/22 0406  07/02/22 0406 07/05/22 0803  NA 136 138 137 135 135  K 3.6 3.5 4.3 4.2 4.8  CL 104 106 107 107 110  CO2 25 26 25 22  20*  GLUCOSE 156* 114* 117* 113* 126*  BUN 15 13 14 16  34*  CREATININE 0.64 0.59 0.62 0.62 0.86  CALCIUM 9.9 9.9 10.1 9.8 9.6   GFR: Estimated Creatinine Clearance: 57.3 mL/min (by C-G formula based on SCr of 0.86 mg/dL). Liver Function Tests: Recent Labs  Lab 06/30/22 0455 07/01/22 0406 07/05/22 0803  AST 17 16 13*  ALT 15 13 10   ALKPHOS 76 74 57  BILITOT 0.7 0.7 0.7  PROT 6.5 6.4* 5.7*  ALBUMIN 3.0* 2.8* 2.4*   No results for input(s): "LIPASE", "AMYLASE" in the last 168 hours. No results for input(s): "AMMONIA" in the last 168 hours. Coagulation Profile: Recent Labs  Lab 07/01/22 0406 07/02/22 0406 07/03/22 0601 07/04/22 0810 07/05/22 0427  INR 2.5* 2.4* 2.6* 2.8* 2.7*   Cardiac Enzymes: No results for input(s): "CKTOTAL", "CKMB", "CKMBINDEX", "TROPONINI" in the last 168 hours. BNP (last 3 results) No results for input(s): "PROBNP" in the last 8760 hours. HbA1C: No results for input(s): "HGBA1C" in the last 72 hours. CBG: Recent Labs  Lab 07/04/22 1156 07/04/22 1733 07/04/22 2224 07/05/22 0837 07/05/22 1137  GLUCAP 151* 148* 131* 116* 167*   Lipid Profile: No results for input(s): "CHOL", "HDL", "LDLCALC", "TRIG", "CHOLHDL", "LDLDIRECT" in the last 72 hours. Thyroid Function Tests: No results for input(s): "TSH", "T4TOTAL", "FREET4", "T3FREE", "THYROIDAB" in the last 72 hours. Anemia Panel: No results for input(s): "VITAMINB12", "FOLATE", "FERRITIN", "TIBC", "IRON", "RETICCTPCT" in the last 72 hours. Sepsis Labs: Recent Labs  Lab 07/01/22 0406  PROCALCITON 0.17    Recent Results (from the past 240 hour(s))  Body fluid culture w Gram Stain     Status: None   Collection Time: 06/30/22  2:00 PM   Specimen: PATH Cytology Pleural fluid  Result Value Ref Range Status   Specimen Description   Final    PLEURAL Performed at [facility] [address], [place], [place] [address]    Special Requests   Final    PLEURAL Performed at [facility], [place] [address] [place], [place] [address]    Gram Stain   Final    FEW WBC PRESENT, PREDOMINANTLY MONONUCLEAR NO ORGANISMS SEEN    Culture   Final    NO GROWTH 3 DAYS Performed at [facility] [address] [place], [place] [address]    Report Status 07/04/2022 FINAL  Final  Body fluid culture w Gram Stain     Status: None (Preliminary result)   Collection Time: 07/03/22  4:09 PM   Specimen: PATH Cytology Pleural fluid  Result Value Ref Range Status   Specimen Description  Final    PLEURAL Performed at [facility], [place] [address], [place], [place] [address]    Special Requests   Final    NONE Performed at [facility], [place] [address], [place], [place] [address]    Gram Stain NO WBC SEEN NO ORGANISMS SEEN   Final   Culture   Final    NO GROWTH 2 DAYS Performed at [facility], [place] [address], [place], [place] [address]    Report Status PENDING  Incomplete     Radiology Studies: DG CHEST PORT 1 VIEW  Result Date: 07/05/2022 CLINICAL DATA:  Right pleural effusion EXAM: PORTABLE CHEST 1 VIEW COMPARISON:  Chest x-ray dated June 03, 2022 FINDINGS: Visualized cardiac and mediastinal contours are unchanged. Right hemithorax volume loss, similar to prior. Increased opacification of the right hemithorax, likely due to increased pleural effusion or atelectasis. No evidence of pneumothorax. IMPRESSION: Increased opacification of the right hemithorax, likely due to increased pleural effusion or atelectasis. Electronically Signed   By: Yetta Glassman M.D.   On: 07/05/2022 08:14   MR BRAIN W WO CONTRAST  Result Date: 07/05/2022 CLINICAL DATA:  76 year old female with history of breast cancer, possible right lung mass. Staging. EXAM: MRI HEAD WITHOUT AND WITH CONTRAST TECHNIQUE:  Multiplanar, multiecho pulse sequences of the brain and surrounding structures were obtained without and with intravenous contrast. CONTRAST:  38mL GADAVIST GADOBUTROL 1 MMOL/ML IV SOLN COMPARISON:  Head CT 08/13/2015. FINDINGS: Brain: Mild motion artifact, including on postcontrast images. No restricted diffusion to suggest acute infarction. No midline shift, mass effect, evidence of mass lesion, ventriculomegaly, extra-axial collection or acute intracranial hemorrhage. Cervicomedullary junction and pituitary are within normal limits. No abnormal enhancement identified.  No dural thickening Some cerebral volume loss since 2016. Patchy, widely scattered bilateral cerebral white matter T2 and FLAIR hyperintensity. Similar signal heterogeneity in the pons. And a pontine chronic microhemorrhage is evident on series 9, image 15. No other chronic cerebral blood products identified. No definite cortical encephalomalacia. Deep gray matter nuclei and cerebellum appear spared. Vascular: Major intracranial vascular flow voids are preserved, the distal right vertebral artery appears dominant. The major dural venous sinuses are enhancing and appear to be patent. Skull and upper cervical spine: Prominent arachnoid granulation in the midline occipital bone (normal variant series 8, image 14 and series 7, image 12. No destructive bone lesion identified. Negative for age visible cervical spine. Sinuses/Orbits: Orbits appear negative aside from postoperative changes to both globes. Paranasal sinuses and mastoids are clear. Other: Visible internal auditory structures appear normal. Negative visible scalp and face. IMPRESSION: 1. Mildly motion degraded exam. No metastatic disease or acute intracranial abnormality identified. 2. Moderate for age signal changes in the cerebral white matter and pons, compatible with chronic small vessel disease. Electronically Signed   By: Genevie Ann M.D.   On: 07/05/2022 07:29   US THORACENTESIS ASP  PLEURAL SPACE W/IMG GUIDE  Result Date: 07/04/2022 INDICATION: Pleural effusion, history of breast cancer, shortness of breath. Request for diagnostic and therapeutic right thoracentesis. EXAM: ULTRASOUND GUIDED DIAGNOSTIC AND THERAPEUTIC RIGHT THORACENTESIS MEDICATIONS: 10 mL 1 % lidocaine COMPLICATIONS: None immediate. PROCEDURE: An ultrasound guided thoracentesis was thoroughly discussed with the patient and questions answered. The benefits, risks, alternatives and complications were also discussed. The patient understands and wishes to proceed with the procedure. Written consent was obtained. Ultrasound was performed to localize and mark an adequate pocket of fluid in the right chest. The area was then prepped and  draped in the normal sterile fashion. 1% Lidocaine was used for local anesthesia. Under ultrasound guidance a 6 Fr Safe-T-Centesis catheter was introduced. Thoracentesis was performed. The catheter was removed and a dressing applied. FINDINGS: A total of approximately 2.5 L of serosanguineous fluid was removed. Samples were sent to the laboratory as requested by the clinical team. IMPRESSION: Successful ultrasound guided right thoracentesis yielding 2.5 L of pleural fluid. Read by: Narda Rutherford, AGNP-BC Electronically Signed   By: Miachel Roux M.D.   On: 07/04/2022 07:35   CT CHEST WO CONTRAST  Result Date: 07/03/2022 CLINICAL DATA:  Pleural effusion.  History of breast cancer. EXAM: CT CHEST WITHOUT CONTRAST TECHNIQUE: Multidetector CT imaging of the chest was performed following the standard protocol without IV contrast. RADIATION DOSE REDUCTION: This exam was performed according to the departmental dose-optimization program which includes automated exposure control, adjustment of the mA and/or kV according to patient size and/or use of iterative reconstruction technique. COMPARISON:  None Available. FINDINGS: Cardiovascular: Heart is normal in size. Small volume pericardial effusion. No  evidence of thoracic aortic aneurysm. Atherosclerotic calcifications of the aortic arch. Coronary atherosclerosis of the LAD and left circumflex. Mediastinum/Nodes: Bulky mediastinal lymphadenopathy, including a 2.8 cm short axis right paratracheal node (series 2/image 85) and a 4.5 cm subcarinal nodal mass. Tumor likely extends to the right hilar region, although this is poorly evaluated on unenhanced CT. Visualized thyroid is unremarkable. Lungs/Pleura: Endobronchial soft tissue occluding the right lower lobe bronchus (series 2/image 33). Associated masslike right lower lobe opacity, likely reflecting a combination of central tumor and postobstructive atelectasis/collapse, although ill-defined on unenhanced CT. A central tumor component of 5.6 cm is suspected (series 2/image 87). Complete right middle lobe atelectasis/collapse. Trace residual right pleural effusion. Associated pleural-based nodularity (series 2/images 50, 72, and 75), reflecting pleural metastases. 6 mm right upper lobe nodule (series 5/image 69), indeterminate. Left lung is clear. Mild centrilobular emphysematous changes. No pneumothorax. Upper Abdomen: Visualized upper abdomen is notable for 12 mm right adrenal nodule (series 2/image 150), new from 2020 CT abdomen/pelvis, suspicious for metastasis. Vascular calcifications. Musculoskeletal: Mild degenerative changes of the lower thoracic spine. IMPRESSION: Suspected large right lower lobe mass, poorly evaluated on unenhanced CT, with associated right middle and lower lobe atelectasis/collapse. This appearance is suspicious for primary bronchogenic neoplasm. Bulky mediastinal nodal metastases. Residual small right pleural effusion with pleural-based metastases. Suspected small right adrenal metastasis. Aortic Atherosclerosis (ICD10-I70.0) and Emphysema (ICD10-J43.9). Electronically Signed   By: Julian Hy M.D.   On: 07/03/2022 19:34   DG Chest Port 1 View  Result Date:  07/03/2022 CLINICAL DATA:  Post right thoracentesis. EXAM: PORTABLE CHEST 1 VIEW COMPARISON:  07/02/2022 FINDINGS: Examination demonstrates interval improvement in patient's right-sided pleural effusion with residual small to moderate effusion present. No pneumothorax. Left lung unchanged. Cardiomediastinal silhouette and remainder of the exam is unchanged. IMPRESSION: Significant interval improvement in patient's right pleural effusion post thoracentesis. No pneumothorax. Electronically Signed   By: Marin Olp M.D.   On: 07/03/2022 16:37    Scheduled Meds:  atorvastatin  60 mg Oral Daily   dofetilide  250 mcg Oral BID   magnesium oxide  400 mg Oral BID   metoprolol succinate  12.5 mg Oral Daily   potassium chloride  10 mEq Oral BID   sodium chloride flush  10-40 mL Intracatheter Q12H   Continuous Infusions:  phytonadione (VITAMIN K) 5 mg in dextrose 5 % 50 mL IVPB       LOS: 3 days  Geradine Girt, DO [facility]  07/05/2022, 12:39 PM

## 2022-07-05 NOTE — Progress Notes (Signed)
A midline has been ordered for this patient. The [facility] RN has reviewed the patient's medical record including any arm restrictions, current creatinine clearance, length IV therapy is needed, and infusions needed/ordered to determine if a midline is the appropriate line for this individual patient. If there are contraindications, the physician and primary RN has been contacted by [facility] RN for further discussion. Midline Education: a midline is a long peripheral IV placed in the upper arm with the tip located at or near the axilla and distal to the shoulder should not be used as a CLABSI preventative measure   it has one lumen only it can remain in place for up to 29 days  Safe for Vancomycin infusion LESS THAN 6 days Is safe for power injection if good blood return can be obtained and line flushes easily it CANNOT be used for continuous infusion of vesicants. Including TPN and chemotherapy Should NOT be placed for sole intent of obtaining labs as there is no guarantee blood can be successfully drawn from line  contraindicated in patients with thrombosis, hypercoagulability, decreased venous flow to the extremities, ESRD (without a nephrologist's approval), small vessels, allergy to polyurethane, or known/suspected presence of a device-related infection, bacteremia, or septicemia  Midline placed by DT RN

## 2022-07-05 NOTE — Telephone Encounter (Signed)
Pharmacy Patient Advocate Encounter  Insurance verification completed.    The patient is insured through [employer]   The patient is currently admitted and ran test claims for the following: Xarelto, Eliquis.  Copays and coinsurance results were relayed to Inpatient clinical team.

## 2022-07-05 NOTE — Progress Notes (Signed)
NAME:  Melody Casey, MRN:  [id number], DOB:  Feb 16, 1946, LOS: 3 ADMISSION DATE:  06/29/2022, CONSULTATION DATE: 7/25 REFERRING MD: Dr. Doristine Bosworth, CHIEF COMPLAINT: Pleural effusion  brief  76 year old female with past medical history as below, which is significant for A-fib on Tikosyn and warfarin, hypertension, scleroderma, and coronary artery disease.  Recent course significant for pneumonia on the tail end of May treated with antibiotics and she seemed to recover adequately.  But then did develop progressive shortness of breath which was slowly progressive over period of weeks ultimately prompting her to present to [place] on 7/17, but left without being seen.  She has been taking Lasix with no improvement in symptoms.  She presented to [facility] emergency department on 7/21 with complains again of progressive shortness of breath.  Chest x-ray demonstrated a pleural effusion, which was known, however, was larger.  She was admitted to the hospitalist service at [facility] and underwent thoracentesis.  She had significant symptomatic relief status post thoracentesis.  Pleural fluid consistent with exudative process and neutrophil predominance.  Unfortunately the pleural effusion recurred and PCCM was consulted on 7/25 for further management.  Pertinent  Medical History   has a past medical history of AAA (abdominal aortic aneurysm) ([place]), Acute diverticulitis, Anxiety, Arthritis, Atrial fibrillation ([place]), CAD (coronary artery disease), Cancer ([place]), Diabetes mellitus, Diverticulitis, Diverticulitis of colon ( ), Dysrhythmia, Foot deformity (1947), Hemangioma, HLD (hyperlipidemia), HTN (hypertension), Osteoporosis, Tobacco abuse, Ureteral obstruction, and Wears dentures.   reports that she quit smoking about 11 years ago. Her smoking use included cigarettes. She has a 100.00 pack-year smoking history. She has never used smokeless tobacco.   Significant Hospital Events: Including  procedures, antibiotic start and stop dates in addition to other pertinent events    06/29/2022 - admit 7/22 Rt thora #1 LDH 175, 73% lymphs, , mesotherlia cless 725/23 CCM consult Repeat thora #2 - LDH 262, Cytop PENDING 7/26 - cT chest with RLL Mass, Significant mediastinal adenompaty. 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 She is upset. Her husband  just dided at [employer] in June 2023 and she is still in grief. Daughter and son lve in [place] but are visiting. No new compliants. Feels deconditioned.  Is on coumadin for A Fib - INr > 2 Has cough  is on ace inihibitor - stopped Dr Lamonte Sakai can do EBUS Friday 07/06/22 - patient posted for this  Interim History / Subjective:    7/27 - son Jearld Fenton and daughte at bedside. C/o diffuse rash x few days. D/w Triad -abx stopped and monitpring. Posted for EBUS 07/06/22 .  Coumadin off x 48h -> INR 2.7. ABG on rom air - PO2 67. Co2 normal   Objective   Blood pressure (!) 92/55, pulse 82, temperature 98.2 F (36.8 C), temperature source Oral, resp. rate 16, height 5' 7"  (1.702 m), weight 75.9 kg, SpO2 93 %.        Intake/Output Summary (Last 24 hours) at 07/05/2022 1119 Last data filed at 07/05/2022 1047 Gross per 24 hour  Intake 1386.22 ml  Output --  Net 1386.22 ml   Filed Weights   06/29/22 1454 06/30/22 0600  Weight: 77.3 kg 75.9 kg    Examination: General Appearance:  Looks deconditined Head:  Normocephalic, without obvious abnormality, atraumatic Eyes:  PERRL - yes, conjunctiva/corneas - clear     Ears:  Normal external ear canals, both ears Nose:  G tube - no but has [place] Throat:  ETT  TUBE - no , OG tube - no Neck:  Supple,  No enlargement/tenderness/nodules Lungs: Clear to auscultation bilaterally,  Heart:  S1 and S2 normal, no murmur, CVP - no.  Pressors - no Abdomen:  Soft, no masses, no organomegaly Genitalia / Rectal:  Not done Extremities:  Extremities- intact Skin:  nDIFFUSE URTICARIA Neurologic:  Sedation - none -> RASS  - +1 . Moves all 4s - yes. CAM-ICU - neg . Orientation - x3+        Resolved Hospital Problem list     Assessment & Plan:  100 pack smoking history - Present on Admit  Lymphocytoic exude Pleural effusion on the right: - Present on Admit - Status post thoracentesis on 7/22  and 7/25: Lymphoticyti exudate  Diagnosis of RLL Mass bulky medastinal adenopaty, pleural mets and adrenal mets  on 7/25 - concern for lung cacner - Present on Admit   Plan  - await pleural fluid cytology #2 - next thora get FLOW CYTOMETRY  - Bronch/EBUS by  BYrum on 07/06/22 at [facility] posted  - Risks of pneumothorax, hemothorax, sedation/anesthesia complications such as cardiac or respiratory arrest or hypotension, stroke and bleeding all explained. Benefits of diagnosis but limitations of non-diagnosis also explained. Patient verbalized understanding and wished to proceed.   - stop coumadin since 07/03/22  - give VIT K 07/05/2022 (c  - check daily INR  - might get cancelled depending on pleural flujids results  - wil need NPO from 2 or 3am 07/06/22 Friday - if effusion recurs (and likely will) will need PleurX but also FLOW CYTOMETRY -no ace inhibiutor (cough) - o3 f2 for pulse ox > 90%   Best Practice (right click and "Reselect all SmartList Selections" daily)   Diet/type: Regular consistency (see orders) DVT prophylaxis: Coumadin -. STOP 07/05/22 GI prophylaxis:  Lines: N/A Foley:  N/A Code Status:  full code Last date of multidisciplinary goals of care discussion [  ]   Updated patient and son and daughter 07/05/2022   SIGNATURE    Dr. Brand Males, M.D., F.C.C.P,  Pulmonary and Critical Care Medicine Staff Physician, [place] Director - Interstitial Lung Disease  Program  Medical Director - [facility] [place], [place], [address]  NPI Number:  NPI #[id number] DEA Number: [id number]  Pager: [phone], If no answer  -> Check AMION or Try [phone] Telephone (clinical office): [phone] Telephone (research): [phone]  11:19 AM 07/05/2022    LABS    PULMONARY Recent Labs  Lab 07/05/22 0440  PHART 7.38  PCO2ART 35  PO2ART 67*  HCO3 20.7  O2SAT 94.1    CBC Recent Labs  Lab 07/03/22 0601 07/04/22 0810 07/05/22 0427  HGB 12.5 12.0 11.9*  HCT 39.7 37.0 37.9  WBC 5.9 6.7 6.9  PLT 212 216 220    COAGULATION Recent Labs  Lab 07/01/22 0406 07/02/22 0406 07/03/22 0601 07/04/22 0810 07/05/22 0427  INR 2.5* 2.4* 2.6* 2.8* 2.7*    CARDIAC  No results for input(s): "TROPONINI" in the last 168 hours. No results for input(s): "PROBNP" in the last 168 hours.   CHEMISTRY Recent Labs  Lab 06/29/22 1514 06/30/22 0455 07/01/22 0406 07/02/22 0406 07/05/22 0803  NA 136 138 137 135 135  K 3.6 3.5 4.3 4.2 4.8  CL 104 106 107 107 110  CO2 25 26 25 22  20*  GLUCOSE 156* 114* 117* 113* 126*  BUN 15 13 14 16  34*  CREATININE 0.64 0.59 0.62 0.62 0.86  CALCIUM 9.9 9.9 10.1 9.8 9.6   Estimated Creatinine Clearance: 57.3 mL/min (by C-G formula based on SCr of 0.86 mg/dL).   LIVER Recent Labs  Lab 06/30/22 0455 07/01/22 0406 07/02/22 0406 07/03/22 0601 07/04/22 0810 07/05/22 0427 07/05/22 0803  AST 17 16  --   --   --   --  13*  ALT 15 13  --   --   --   --  10  ALKPHOS 76 74  --   --   --   --  57  BILITOT 0.7 0.7  --   --   --   --  0.7  PROT 6.5 6.4*  --   --   --   --  5.7*  ALBUMIN 3.0* 2.8*  --   --   --   --  2.4*  INR 2.5* 2.5* 2.4* 2.6* 2.8* 2.7*  --      INFECTIOUS Recent Labs  Lab 07/01/22 0406  PROCALCITON 0.17     ENDOCRINE CBG (last 3)  Recent Labs    07/04/22 1733 07/04/22 2224 07/05/22 0837  GLUCAP 148* 131* 116*         IMAGING x48h  - image(s) personally visualized  -   highlighted in bold DG CHEST PORT 1 VIEW  Result Date: 07/05/2022 CLINICAL DATA:  Right pleural effusion EXAM: PORTABLE CHEST 1 VIEW  COMPARISON:  Chest x-ray dated June 03, 2022 FINDINGS: Visualized cardiac and mediastinal contours are unchanged. Right hemithorax volume loss, similar to prior. Increased opacification of the right hemithorax, likely due to increased pleural effusion or atelectasis. No evidence of pneumothorax. IMPRESSION: Increased opacification of the right hemithorax, likely due to increased pleural effusion or atelectasis. Electronically Signed   By: Yetta Glassman M.D.   On: 07/05/2022 08:14   MR BRAIN W WO CONTRAST  Result Date: 07/05/2022 CLINICAL DATA:  76 year old female with history of breast cancer, possible right lung mass. Staging. EXAM: MRI HEAD WITHOUT AND WITH CONTRAST TECHNIQUE: Multiplanar, multiecho pulse sequences of the brain and surrounding structures were obtained without and with intravenous contrast. CONTRAST:  13m GADAVIST GADOBUTROL 1 MMOL/ML IV SOLN COMPARISON:  Head CT 08/13/2015. FINDINGS: Brain: Mild motion artifact, including on postcontrast images. No restricted diffusion to suggest acute infarction. No midline shift, mass effect, evidence of mass lesion, ventriculomegaly, extra-axial collection or acute intracranial hemorrhage. Cervicomedullary junction and pituitary are within normal limits. No abnormal enhancement identified.  No dural thickening Some cerebral volume loss since 2016. Patchy, widely scattered bilateral cerebral white matter T2 and FLAIR hyperintensity. Similar signal heterogeneity in the pons. And a pontine chronic microhemorrhage is evident on series 9, image 15. No other chronic cerebral blood products identified. No definite cortical encephalomalacia. Deep gray matter nuclei and cerebellum appear spared. Vascular: Major intracranial vascular flow voids are preserved, the distal right vertebral artery appears dominant. The major dural venous sinuses are enhancing and appear to be patent. Skull and upper cervical spine: Prominent arachnoid granulation in the midline  occipital bone (normal variant series 8, image 14 and series 7, image 12. No destructive bone lesion identified. Negative for age visible cervical spine. Sinuses/Orbits: Orbits appear negative aside from postoperative changes to both globes. Paranasal sinuses and mastoids are clear. Other: Visible internal auditory structures appear normal. Negative visible scalp and face. IMPRESSION: 1. Mildly motion degraded exam. No metastatic disease or acute intracranial abnormality identified. 2. Moderate for age signal  changes in the cerebral white matter and pons, compatible with chronic small vessel disease. Electronically Signed   By: Genevie Ann M.D.   On: 07/05/2022 07:29   US THORACENTESIS ASP PLEURAL SPACE W/IMG GUIDE  Result Date: 07/04/2022 INDICATION: Pleural effusion, history of breast cancer, shortness of breath. Request for diagnostic and therapeutic right thoracentesis. EXAM: ULTRASOUND GUIDED DIAGNOSTIC AND THERAPEUTIC RIGHT THORACENTESIS MEDICATIONS: 10 mL 1 % lidocaine COMPLICATIONS: None immediate. PROCEDURE: An ultrasound guided thoracentesis was thoroughly discussed with the patient and questions answered. The benefits, risks, alternatives and complications were also discussed. The patient understands and wishes to proceed with the procedure. Written consent was obtained. Ultrasound was performed to localize and mark an adequate pocket of fluid in the right chest. The area was then prepped and draped in the normal sterile fashion. 1% Lidocaine was used for local anesthesia. Under ultrasound guidance a 6 Fr Safe-T-Centesis catheter was introduced. Thoracentesis was performed. The catheter was removed and a dressing applied. FINDINGS: A total of approximately 2.5 L of serosanguineous fluid was removed. Samples were sent to the laboratory as requested by the clinical team. IMPRESSION: Successful ultrasound guided right thoracentesis yielding 2.5 L of pleural fluid. Read by: Narda Rutherford, AGNP-BC Electronically  Signed   By: Miachel Roux M.D.   On: 07/04/2022 07:35   CT CHEST WO CONTRAST  Result Date: 07/03/2022 CLINICAL DATA:  Pleural effusion.  History of breast cancer. EXAM: CT CHEST WITHOUT CONTRAST TECHNIQUE: Multidetector CT imaging of the chest was performed following the standard protocol without IV contrast. RADIATION DOSE REDUCTION: This exam was performed according to the departmental dose-optimization program which includes automated exposure control, adjustment of the mA and/or kV according to patient size and/or use of iterative reconstruction technique. COMPARISON:  None Available. FINDINGS: Cardiovascular: Heart is normal in size. Small volume pericardial effusion. No evidence of thoracic aortic aneurysm. Atherosclerotic calcifications of the aortic arch. Coronary atherosclerosis of the LAD and left circumflex. Mediastinum/Nodes: Bulky mediastinal lymphadenopathy, including a 2.8 cm short axis right paratracheal node (series 2/image 85) and a 4.5 cm subcarinal nodal mass. Tumor likely extends to the right hilar region, although this is poorly evaluated on unenhanced CT. Visualized thyroid is unremarkable. Lungs/Pleura: Endobronchial soft tissue occluding the right lower lobe bronchus (series 2/image 33). Associated masslike right lower lobe opacity, likely reflecting a combination of central tumor and postobstructive atelectasis/collapse, although ill-defined on unenhanced CT. A central tumor component of 5.6 cm is suspected (series 2/image 87). Complete right middle lobe atelectasis/collapse. Trace residual right pleural effusion. Associated pleural-based nodularity (series 2/images 50, 72, and 75), reflecting pleural metastases. 6 mm right upper lobe nodule (series 5/image 69), indeterminate. Left lung is clear. Mild centrilobular emphysematous changes. No pneumothorax. Upper Abdomen: Visualized upper abdomen is notable for 12 mm right adrenal nodule (series 2/image 150), new from 2020 CT  abdomen/pelvis, suspicious for metastasis. Vascular calcifications. Musculoskeletal: Mild degenerative changes of the lower thoracic spine. IMPRESSION: Suspected large right lower lobe mass, poorly evaluated on unenhanced CT, with associated right middle and lower lobe atelectasis/collapse. This appearance is suspicious for primary bronchogenic neoplasm. Bulky mediastinal nodal metastases. Residual small right pleural effusion with pleural-based metastases. Suspected small right adrenal metastasis. Aortic Atherosclerosis (ICD10-I70.0) and Emphysema (ICD10-J43.9). Electronically Signed   By: Julian Hy M.D.   On: 07/03/2022 19:34   DG Chest Port 1 View  Result Date: 07/03/2022 CLINICAL DATA:  Post right thoracentesis. EXAM: PORTABLE CHEST 1 VIEW COMPARISON:  07/02/2022 FINDINGS: Examination demonstrates interval improvement in patient's right-sided pleural  effusion with residual small to moderate effusion present. No pneumothorax. Left lung unchanged. Cardiomediastinal silhouette and remainder of the exam is unchanged. IMPRESSION: Significant interval improvement in patient's right pleural effusion post thoracentesis. No pneumothorax. Electronically Signed   By: Marin Olp M.D.   On: 07/03/2022 16:37   Mrs

## 2022-07-06 ENCOUNTER — Encounter (HOSPITAL_COMMUNITY)
Admission: EM | Disposition: A | Discharge status: Home Health | Payer: Self-pay | Source: Home / Self Care | Attending: Internal Medicine

## 2022-07-06 ENCOUNTER — Encounter (HOSPITAL_COMMUNITY): Payer: Self-pay | Admitting: Family Medicine

## 2022-07-06 ENCOUNTER — Inpatient Hospital Stay (HOSPITAL_COMMUNITY): Payer: Medicare Other | Admitting: Anesthesiology

## 2022-07-06 DIAGNOSIS — R918 Other nonspecific abnormal finding of lung field: Secondary | ICD-10-CM

## 2022-07-06 DIAGNOSIS — R9389 Abnormal findings on diagnostic imaging of other specified body structures: Secondary | ICD-10-CM | POA: Diagnosis not present

## 2022-07-06 DIAGNOSIS — Z87891 Personal history of nicotine dependence: Secondary | ICD-10-CM

## 2022-07-06 DIAGNOSIS — C3400 Malignant neoplasm of unspecified main bronchus: Secondary | ICD-10-CM | POA: Diagnosis not present

## 2022-07-06 DIAGNOSIS — J989 Respiratory disorder, unspecified: Secondary | ICD-10-CM

## 2022-07-06 DIAGNOSIS — R590 Localized enlarged lymph nodes: Secondary | ICD-10-CM | POA: Diagnosis not present

## 2022-07-06 DIAGNOSIS — J90 Pleural effusion, not elsewhere classified: Secondary | ICD-10-CM | POA: Diagnosis not present

## 2022-07-06 HISTORY — PX: BIOPSY: SHX5522

## 2022-07-06 HISTORY — PX: ENDOBRONCHIAL ULTRASOUND: SHX5096

## 2022-07-06 HISTORY — PX: FINE NEEDLE ASPIRATION: SHX5430

## 2022-07-06 HISTORY — PX: BRONCHIAL BRUSHINGS: SHX5108

## 2022-07-06 HISTORY — PX: VIDEO BRONCHOSCOPY: SHX5072

## 2022-07-06 LAB — CYTOLOGY - NON PAP

## 2022-07-06 LAB — CBC
HCT: 34.6 % — ABNORMAL LOW (ref 36.0–46.0)
Hemoglobin: 11.2 g/dL — ABNORMAL LOW (ref 12.0–15.0)
MCH: 28.6 pg (ref 26.0–34.0)
MCHC: 32.4 g/dL (ref 30.0–36.0)
MCV: 88.3 fL (ref 80.0–100.0)
Platelets: 219 10*3/uL (ref 150–400)
RBC: 3.92 MIL/uL (ref 3.87–5.11)
RDW: 15.3 % (ref 11.5–15.5)
WBC: 7.3 10*3/uL (ref 4.0–10.5)
nRBC: 0 % (ref 0.0–0.2)

## 2022-07-06 LAB — COMPREHENSIVE METABOLIC PANEL
ALT: 10 U/L (ref 0–44)
AST: 12 U/L — ABNORMAL LOW (ref 15–41)
Albumin: 2.3 g/dL — ABNORMAL LOW (ref 3.5–5.0)
Alkaline Phosphatase: 58 U/L (ref 38–126)
Anion gap: 7 (ref 5–15)
BUN: 41 mg/dL — ABNORMAL HIGH (ref 8–23)
CO2: 20 mmol/L — ABNORMAL LOW (ref 22–32)
Calcium: 9.7 mg/dL (ref 8.9–10.3)
Chloride: 107 mmol/L (ref 98–111)
Creatinine, Ser: 0.92 mg/dL (ref 0.44–1.00)
GFR, Estimated: >60 mL/min (ref >60)
Glucose, Bld: 113 mg/dL — ABNORMAL HIGH (ref 70–99)
Potassium: 4.7 mmol/L (ref 3.5–5.1)
Sodium: 134 mmol/L — ABNORMAL LOW (ref 135–145)
Total Bilirubin: 0.9 mg/dL (ref 0.3–1.2)
Total Protein: 5.6 g/dL — ABNORMAL LOW (ref 6.5–8.1)

## 2022-07-06 LAB — PROTIME-INR
INR: 1.3 — ABNORMAL HIGH (ref 0.8–1.2)
Prothrombin Time: 15.7 seconds — ABNORMAL HIGH (ref 11.4–15.2)

## 2022-07-06 LAB — GLUCOSE, CAPILLARY
Glucose-Capillary: 124 mg/dL — ABNORMAL HIGH (ref 70–99)
Glucose-Capillary: 136 mg/dL — ABNORMAL HIGH (ref 70–99)
Glucose-Capillary: 222 mg/dL — ABNORMAL HIGH (ref 70–99)
Glucose-Capillary: 238 mg/dL — ABNORMAL HIGH (ref 70–99)

## 2022-07-06 SURGERY — ENDOBRONCHIAL ULTRASOUND (EBUS)
Anesthesia: General

## 2022-07-06 MED ORDER — FENTANYL CITRATE (PF) 100 MCG/2ML IJ SOLN
INTRAMUSCULAR | Status: DC | PRN
  Administered 2022-07-06: 100 ug via INTRAVENOUS

## 2022-07-06 MED ORDER — ONDANSETRON HCL 4 MG/2ML IJ SOLN
INTRAMUSCULAR | Status: DC | PRN
  Administered 2022-07-06: 4 mg via INTRAVENOUS

## 2022-07-06 MED ORDER — ROCURONIUM BROMIDE 10 MG/ML (PF) SYRINGE
PREFILLED_SYRINGE | INTRAVENOUS | Status: DC | PRN
  Administered 2022-07-06: 80 mg via INTRAVENOUS

## 2022-07-06 MED ORDER — PROPOFOL 10 MG/ML IV BOLUS
INTRAVENOUS | Status: DC | PRN
  Administered 2022-07-06: 130 mg via INTRAVENOUS

## 2022-07-06 MED ORDER — FENTANYL CITRATE (PF) 100 MCG/2ML IJ SOLN
INTRAMUSCULAR | Status: AC
  Filled 2022-07-06: qty 2

## 2022-07-06 MED ORDER — CIPROFLOXACIN IN D5W 400 MG/200ML IV SOLN
INTRAVENOUS | Status: AC
  Filled 2022-07-06: qty 200

## 2022-07-06 MED ORDER — PROPOFOL 10 MG/ML IV BOLUS
INTRAVENOUS | Status: AC
  Filled 2022-07-06: qty 20

## 2022-07-06 MED ORDER — PREDNISONE 20 MG PO TABS
40.0000 mg | ORAL_TABLET | Freq: Every day | ORAL | Status: DC
  Administered 2022-07-06 – 2022-07-09 (×4): 40 mg via ORAL
  Filled 2022-07-06 (×4): qty 2

## 2022-07-06 MED ORDER — LIDOCAINE 2% (20 MG/ML) 5 ML SYRINGE
INTRAMUSCULAR | Status: DC | PRN
  Administered 2022-07-06: 80 mg via INTRAVENOUS

## 2022-07-06 MED ORDER — SUGAMMADEX SODIUM 200 MG/2ML IV SOLN
INTRAVENOUS | Status: DC | PRN
  Administered 2022-07-06: 200 mg via INTRAVENOUS

## 2022-07-06 MED ORDER — DEXAMETHASONE SODIUM PHOSPHATE 10 MG/ML IJ SOLN
INTRAMUSCULAR | Status: DC | PRN
  Administered 2022-07-06: 5 mg via INTRAVENOUS

## 2022-07-06 MED ORDER — LACTATED RINGERS IV SOLN: INTRAVENOUS | Status: DC

## 2022-07-06 MED ORDER — PHENYLEPHRINE 80 MCG/ML (10ML) SYRINGE FOR IV PUSH (FOR BLOOD PRESSURE SUPPORT)
PREFILLED_SYRINGE | INTRAVENOUS | Status: DC | PRN
  Administered 2022-07-06 (×2): 160 ug via INTRAVENOUS

## 2022-07-06 NOTE — Op Note (Signed)
[facility] Cardiopulmonary Patient Name: Melody Casey Procedure Date: 07/06/2022 MRN: [id number] Attending MD: Collene Gobble , MD Date of Birth: 04-Mar-1946 CSN: [id number] Age: 76 Admit Type: Inpatient Ethnicity: Not Hispanic or Latino Procedure:             Bronchoscopy Indications:           Right lower lobe mass, Mediastinal adenopathy Providers:             Collene Gobble, MD, Jaci Carrel, RN, Fransico Setters                         Mbumina, Technician, Darliss Cheney, Technician Referring MD:           Medicines:             General Anesthesia Complications:         No immediate complications. Estimated blood loss:                         Minimal Estimated Blood Loss:  Estimated blood loss was minimal. Procedure:      Pre-Anesthesia Assessment:      - A History and Physical has been performed. Patient meds and allergies       have been reviewed. The risks and benefits of the procedure and the       sedation options and risks were discussed with the patient. All       questions were answered and informed consent was obtained. Patient       identification and proposed procedure were verified prior to the       procedure by the physician in the pre-procedure area. Mental Status       Examination: normal. Airway Examination: normal oropharyngeal airway.       Respiratory Examination: poor air movement in the right lung. CV       Examination: normal. Prior Anticoagulants: The patient has taken       Coumadin (warfarin), last dose was 4 days prior to procedure. ASA Grade       Assessment: III - A patient with severe systemic disease. After       reviewing the risks and benefits, the patient was deemed in satisfactory       condition to undergo the procedure. The anesthesia plan was to use       general anesthesia. Immediately prior to administration of medications,       the patient was re-assessed for adequacy to receive sedatives. The heart       rate, respiratory rate,  oxygen saturations, blood pressure, adequacy of       pulmonary ventilation, and response to care were monitored throughout       the procedure. The physical status of the patient was re-assessed after       the procedure.      After obtaining informed consent, the bronchoscope was passed under       direct vision. Throughout the procedure, the patient's blood pressure,       pulse, and oxygen saturations were monitored continuously. the [id number]       ([id number] Olympus bronoscope was introduced through the mouth, via the       endotracheal tube (the patient was intubated for the procedure) and       advanced to the tracheobronchial tree. the Bronchoscope was introduced       through the and advanced to the.  The procedure was accomplished without       difficulty. The patient tolerated the procedure well. Findings:      The trachea is of normal caliber. The carina is sharp. The       tracheobronchial tree of the left lung was examined to at least the       first subsegmental level. Bronchial mucosa and anatomy in the left lung       are normal; there are no endobronchial lesions, and no secretions.      Right Lung Abnormalities: A partially obstructing mass was found       distally in the bronchus intermedius involving RLL and RML airways. The       mass was large and circumferential, exophytic and raised. The lesion was       successfully traversed but extended along the walls distally. A second       non-obstructing mass was found proximally in the right upper lobe. The       mass was small and endobronchial, friable and polypoid. The lesion was       successfully traversed.      Brushings of a mass were obtained in the right upper lobe and in the       right lower lobe with a cytology brush and sent for routine cytology.       Four samples were obtained.      Endobronchial biopsies of a mass were performed in the right lower lobe       using a forceps and sent for histopathology  examination. Five samples       were obtained      An endobronchial ultrasound endoscope was utilized in order to assist       with fine needle aspiration in the right paratracheal area, in the left       paratracheal area, in the anterior carinal area and in the subcarinal       area. Transbronchial needle aspiration of Nodes was performed at [place] 7 Nodes using a Wang 19 gauge needle and sent for routine       cytology. The procedure was guided by ultrasound. Transbronchial needle       aspiration technique was selected because the sampling site was not       visible endoscopically. The sampling device penetrated the full       thickness of the bronchial wall to obtain the needle aspiration of lung       tissue.      . Impression:      - Right lower lobe mass      - Mediastinal adenopathy      - The airway examination of the left lung was normal.      - A circumferential, exophytic and raised mass was found in the bronchus       intermedius. This lesion is malignant.      - An endobronchial, friable and polypoid mass was found in the right       upper lobe. This lesion is likely malignant.      - Endobronchial ultrasound was performed.      - Transbronchial needle aspiration was performed.      - Brushings were obtained.      - Endobronchial biopsies were performed. Moderate Sedation:      Performed under general anesthesia Recommendation:      - Await biopsy and brushing results. Procedure  Code(s):      --- Professional ---      [phone], Bronchoscopy, rigid or flexible, including fluoroscopic guidance,       when performed; with transbronchial needle aspiration biopsy(s),       trachea, main stem and/or lobar bronchus(i)      46962, 55, Bronchoscopy, rigid or flexible, including fluoroscopic       guidance, when performed; with bronchial or endobronchial biopsy(s),       single or multiple sites      31623, Bronchoscopy, rigid or flexible, including  fluoroscopic guidance,       when performed; with brushing or protected brushings      31654, Bronchoscopy, rigid or flexible, including fluoroscopic guidance,       when performed; with transendoscopic endobronchial ultrasound (EBUS)       during bronchoscopic diagnostic or therapeutic intervention(s) for       peripheral lesion(s) (List separately in addition to code for primary       procedure[s]) Diagnosis Code(s):      --- Professional ---      R91.8, Other nonspecific abnormal finding of lung field      R59.0, Localized enlarged lymph nodes      C34.00, Malignant neoplasm of unspecified main bronchus      J98.9, Respiratory disorder, unspecified CPT copyright 2019 American Medical Association. All rights reserved. The codes documented in this report are preliminary and upon coder review may  be revised to meet current compliance requirements. Collene Gobble, MD Collene Gobble, MD 07/06/2022 3:58:01 PM Number of Addenda: 0 Scope In: Scope Out:

## 2022-07-06 NOTE — TOC Progression Note (Signed)
Transition of Care [facility]) - Progression Note    Patient Details  Name: Melody Casey MRN: [id number] Date of Birth: 08/23/1946  [facility] CM/SW Contact  Leeroy Cha, RN Phone Number: 07/06/2022, 2:40 PM  Clinical Narrative:    Request for pt and ot sent to [facility]   Expected Discharge Plan: Home/Self Care Barriers to Discharge: Continued Medical Work up  Expected Discharge Plan and Services Expected Discharge Plan: Home/Self Care   Discharge Planning Services: CM Consult   Living arrangements for the past 2 months: Single Family Home                                       Social Determinants of Health (SDOH) Interventions    Readmission Risk Interventions     No data to display

## 2022-07-06 NOTE — Plan of Care (Signed)
  Problem: Clinical Measurements: Goal: Ability to maintain clinical measurements within normal limits will improve Outcome: Progressing Goal: Diagnostic test results will improve Outcome: Progressing Goal: Respiratory complications will improve Outcome: Progressing   

## 2022-07-06 NOTE — Progress Notes (Signed)
Occupational Therapy Treatment Patient Details Name: Melody Casey MRN: [id number] DOB: 1946-04-08 Today's Date: 07/06/2022   History of present illness 76 y.o. female admitted 06/29/22 with pleural effusion. patient had thoracentesis on 7/22 with 1.3L collected.  Past medical history: of A-fib, on Tikosyn and warfarin, hypertension, scleroderma, CAD status post PCI, Diverticulitis/perforated sigmoid colon with colostomy, diabetes mellitus, breast cancer, AAA, L TKA 01/01/22   OT comments  Patient demonstrated independence with ADLs today - including toileting, managing colostomy, standing grooming and donning and doffing shoes. Currently Patient has met OT goals and is functionally independent. Patient lives alone and she is concerned that she may not be able to in the future. Her affect is flat and she is depressed. Therapist provided compassionate listening. She is concerned about her diagnosis and she is recovering from her husband passing away. Recommend chaplain support. She may need assistance at discharge from family.   Recommendations for follow up therapy are one component of a multi-disciplinary discharge planning process, led by the attending physician.  Recommendations may be updated based on patient status, additional functional criteria and insurance authorization.    Follow Up Recommendations  No OT follow up    Assistance Recommended at Discharge PRN  Patient can return home with the following  Assistance with cooking/housework   Equipment Recommendations  None recommended by OT    Recommendations for Other Services      Precautions / Restrictions Precautions Precautions: None Precaution Comments: colostomy(2019) Restrictions Weight Bearing Restrictions: No          Balance Overall balance assessment: Mild deficits observed, not formally tested                                         ADL either performed or assessed with clinical judgement   ADL  Overall ADL's : Modified independent Eating/Feeding: Independent   Grooming: Independent               Lower Body Dressing: Independent   Toilet Transfer: Modified Independent;Regular Toilet;Grab bars   Toileting- Clothing Manipulation and Hygiene: Modified independent       Functional mobility during ADLs: Modified independent;Rolling walker (2 wheels) General ADL Comments: Patient was independent with donning shoes, performing toileting and managing colostomy, standing at the sink and in room ambulation with RW.    Extremity/Trunk Assessment Upper Extremity Assessment Upper Extremity Assessment: Overall WFL for tasks assessed            Vision Patient Visual Report: No change from baseline     Perception     Praxis      Cognition Arousal/Alertness: Awake/alert Behavior During Therapy: WFL for tasks assessed/performed Overall Cognitive Status: Within Functional Limits for tasks assessed                                 General Comments: Depressed        Exercises      Shoulder Instructions       General Comments      Pertinent Vitals/ Pain       Pain Assessment Pain Assessment: No/denies pain  Home Living  Prior Functioning/Environment              Frequency           Progress Toward Goals  OT Goals(current goals can now be found in the care plan section)  Progress towards OT goals: Goals met/education completed, patient discharged from [facility] All goals met and education completed, patient discharged from OT services    Co-evaluation                 AM-PAC OT "6 Clicks" Daily Activity     Outcome Measure   Help from another person eating meals?: None Help from another person taking care of personal grooming?: None Help from another person toileting, which includes using toliet, bedpan, or urinal?: None Help from another person bathing  (including washing, rinsing, drying)?: None Help from another person to put on and taking off regular upper body clothing?: None Help from another person to put on and taking off regular lower body clothing?: None 6 Click Score: 24    End of Session Equipment Utilized During Treatment: Rolling walker (2 wheels)  OT Visit Diagnosis: Unsteadiness on feet (R26.81);Other abnormalities of gait and mobility (R26.89);Muscle weakness (generalized) (M62.81)   Activity Tolerance Patient tolerated treatment well   Patient Left in bed;with call bell/phone within reach   Nurse Communication Mobility status        Time: 9030-1499 OT Time Calculation (min): 23 min  Charges: OT General Charges $OT Visit: 1 Visit OT Treatments $Self Care/Home Management : 8-22 mins  Derl Barrow, OTR/L [place]  Office [phone] Pager: [place] 07/06/2022, 12:42 PM

## 2022-07-06 NOTE — Interval H&P Note (Signed)
History and Physical Interval Note:  07/06/2022 2:31 PM  Melody Casey  has presented today for surgery, with the diagnosis of mediastinal adenopathy, RLL mass.  The various methods of treatment have been discussed with the patient and family. After consideration of risks, benefits and other options for treatment, the patient has consented to  Procedure(s): ENDOBRONCHIAL ULTRASOUND (Bilateral) VIDEO BRONCHOSCOPY WITHOUT FLUORO (N/A) as a surgical intervention.  The patient's history has been reviewed, patient examined, no change in status, stable for surgery.  I have reviewed the patient's chart and labs.  Questions were answered to the patient's satisfaction.     Collene Gobble

## 2022-07-06 NOTE — Progress Notes (Signed)
Physical Therapy Treatment Patient Details Name: Melody Casey MRN: [id number] DOB: [birth year] Today's Date: 07/06/2022   History of Present Illness 76 y.o. female admitted 06/29/22 with pleural effusion. patient had thoracentesis on 7/22 with 1.3L collected.  Pt found to have lung mass with ongoing workup. Past medical history: of A-fib, on Tikosyn and warfarin, hypertension, scleroderma, CAD status post PCI, Diverticulitis/perforated sigmoid colon with colostomy, diabetes mellitus, breast cancer, AAA, L TKA 01/01/22    PT Comments    Pt making gradual progress.  She required min cues for posture and safe RW use and educated on gradually increasing endurance as able.  Pt tolerated activity on RA.  Pt was on 2 L O2 but wanted to try without.  At rest 94% on 2 L, 93% on RA.  Ambulated on RA sats 91%.  Notified RN and left on RA    Recommendations for follow up therapy are one component of a multi-disciplinary discharge planning process, led by the attending physician.  Recommendations may be updated based on patient status, additional functional criteria and insurance authorization.  Follow Up Recommendations  Home health PT (family requesting HHPT/OT)     Assistance Recommended at Discharge PRN  Patient can return home with the following Help with stairs or ramp for entrance   Equipment Recommendations  None recommended by PT    Recommendations for Other Services       Precautions / Restrictions Precautions Precautions: None Precaution Comments: colostomy(2019) Restrictions Weight Bearing Restrictions: No     Mobility  Bed Mobility Overal bed mobility: Modified Independent       Supine to sit: Modified independent (Device/Increase time)     General bed mobility comments: HOB elevated    Transfers Overall transfer level: Needs assistance Equipment used: Rolling walker (2 wheels) Transfers: Sit to/from Stand Sit to Stand: Supervision           General transfer  comment: Performed x 3 during session    Ambulation/Gait Ambulation/Gait assistance: Supervision Gait Distance (Feet): 150 Feet (100' then 150') Assistive device: Rolling walker (2 wheels) Gait Pattern/deviations: Step-through pattern, Trunk flexed Gait velocity: decreased     General Gait Details: Cues for posture (could correct) and staying close to RW.  Ambulated 100' then 150' after 3 min rest break.   Stairs             Wheelchair Mobility    Modified Rankin (Stroke Patients Only)       Balance Overall balance assessment: Needs assistance Sitting-balance support: No upper extremity supported Sitting balance-Leahy Scale: Fair     Standing balance support: No upper extremity supported Standing balance-Leahy Scale: Fair Standing balance comment: RW to ambulate but could static stand                            Cognition Arousal/Alertness: Awake/alert Behavior During Therapy: WFL for tasks assessed/performed Overall Cognitive Status: Within Functional Limits for tasks assessed                                          Exercises      General Comments General comments (skin integrity, edema, etc.): Pt was on 2 L O2 but wanted to try without.  At rest 94% on 2 L, 93% on RA.  Ambulated on RA sats 91%.  Notified RN and left on RA.  Educated  on frequent short bouts of ambulation and gradually increasing as able      Pertinent Vitals/Pain Pain Assessment Pain Assessment: No/denies pain    Home Living                          Prior Function            PT Goals (current goals can now be found in the care plan section) Progress towards PT goals: Progressing toward goals    Frequency    Min 3X/week      PT Plan Discharge plan needs to be updated    Co-evaluation              AM-PAC PT "6 Clicks" Mobility   Outcome Measure  Help needed turning from your back to your side while in a flat bed without using  bedrails?: None Help needed moving from lying on your back to sitting on the side of a flat bed without using bedrails?: None Help needed moving to and from a bed to a chair (including a wheelchair)?: A Little Help needed standing up from a chair using your arms (e.g., wheelchair or bedside chair)?: A Little Help needed to walk in hospital room?: A Little Help needed climbing 3-5 steps with a railing? : A Little 6 Click Score: 20    End of Session Equipment Utilized During Treatment: Gait belt Activity Tolerance: Patient tolerated treatment well Patient left: in bed;with call bell/phone within reach;with family/visitor present Nurse Communication: Mobility status PT Visit Diagnosis: Difficulty in walking, not elsewhere classified (R26.2)     Time: 2683-4196 PT Time Calculation (min) (ACUTE ONLY): 21 min  Charges:  $Gait Training: 8-22 mins                     Abran Richard, PT Acute Rehab [facility] [phone]    Karlton Lemon 07/06/2022, 1:02 PM

## 2022-07-06 NOTE — Anesthesia Postprocedure Evaluation (Signed)
Anesthesia Post Note  Patient: Melody Casey  Procedure(s) Performed: ENDOBRONCHIAL ULTRASOUND (Bilateral) VIDEO BRONCHOSCOPY WITHOUT FLUORO FINE NEEDLE ASPIRATION (FNA) LINEAR BRONCHIAL BRUSHINGS BIOPSY     Patient location during evaluation: PACU Anesthesia Type: General Level of consciousness: awake and alert, oriented and patient cooperative Pain management: pain level controlled Vital Signs Assessment: post-procedure vital signs reviewed and stable Respiratory status: spontaneous breathing, nonlabored ventilation and respiratory function stable Cardiovascular status: blood pressure returned to baseline and stable Postop Assessment: no apparent nausea or vomiting Anesthetic complications: no   No notable events documented.  Last Vitals:  Vitals:   07/06/22 1609 07/06/22 1614  BP:  (!) 127/99  Pulse: 85 81  Resp: (!) 22 19  Temp:    SpO2: 94% 91%    Last Pain:  Vitals:   07/06/22 1424  TempSrc: Oral  PainSc: 0-No pain                 Pervis Hocking

## 2022-07-06 NOTE — H&P (View-Only) (Signed)
 NAME:  Melody Casey, MRN:  [id number], DOB:  08/03/1946, LOS: 4 ADMISSION DATE:  06/29/2022, CONSULTATION DATE: 7/25 REFERRING MD: Dr. Pahwani, CHIEF COMPLAINT: Pleural effusion  brief  76-year-old female with past medical history as below, which is significant for A-fib on Tikosyn and warfarin, hypertension, scleroderma, and coronary artery disease.  Recent course significant for pneumonia on the tail end of May treated with antibiotics and she seemed to recover adequately.  But then did develop progressive shortness of breath which was slowly progressive over period of weeks ultimately prompting her to present to [facility] on 7/17, but left without being seen.  She has been taking Lasix with no improvement in symptoms.  She presented to [facility] emergency department on 7/21 with complains again of progressive shortness of breath.  Chest x-ray demonstrated a pleural effusion, which was known, however, was larger.  She was admitted to the hospitalist service at [facility] and underwent thoracentesis.  She had significant symptomatic relief status post thoracentesis.  Pleural fluid consistent with exudative process and neutrophil predominance.  Unfortunately the pleural effusion recurred and PCCM was consulted on 7/25 for further management.  Pertinent  Medical History   has a past medical history of AAA (abdominal aortic aneurysm) (HCC), Acute diverticulitis, Anxiety, Arthritis, Atrial fibrillation (HCC), CAD (coronary artery disease), Cancer (HCC), Diabetes mellitus, Diverticulitis, Diverticulitis of colon ( ), Dysrhythmia, Foot deformity (1947), Hemangioma, HLD (hyperlipidemia), HTN (hypertension), Osteoporosis, Tobacco abuse, Ureteral obstruction, and Wears dentures.   reports that she quit smoking about 11 years ago. Her smoking use included cigarettes. She has a 100.00 pack-year smoking history. She has never used smokeless tobacco.   Significant Hospital Events: Including  procedures, antibiotic start and stop dates in addition to other pertinent events    06/29/2022 - admit 7/22 Rt thora #1 LDH 175, 73% lymphs, , mesotherlia cless 725/23 CCM consult Repeat thora #2 - LDH 262, Cytop PENDING 7/26 - cT chest with RLL Mass, Significant mediastinal adenompaty. 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 She is upset. Her husband  just dided at [place] in June 2023 and she is still in grief. Daughter and son lve in [place] but are visiting. No new compliants. Feels deconditioned.  Is on coumadin for A Fib - INr > 2 Has cough  is on ace inihibitor - stopped Dr Byrum can do EBUS Friday 07/06/22 - patient posted for this  Interim History / Subjective:   Comfortable.  Remains on supplemental oxygen INR has normalized   Objective   Blood pressure (!) 116/54, pulse 78, temperature (!) 97.5 F (36.4 C), temperature source Oral, resp. rate 19, height 5' 7" (1.702 m), weight 75.9 kg, SpO2 (!) 89 %.        Intake/Output Summary (Last 24 hours) at 07/06/2022 1133 Last data filed at 07/05/2022 1600 Gross per 24 hour  Intake 45.67 ml  Output --  Net 45.67 ml   Filed Weights   06/29/22 1454 06/30/22 0600  Weight: 77.3 kg 75.9 kg    Examination: General Appearance: Elderly woman in no distress Head: Normocephalic Eyes: Pupils equal Throat: Some expiratory upper airway noise, stridor with cough Lungs: Bilateral scattered rhonchi more so on the right Heart: Regular, distant, no murmur Abdomen: Nondistended with positive bowel sounds Genitalia / Rectal: Deferred Extremities: No edema Skin: Scattered erythema Neurologic: Awake, alert, appropriate, nonfocal   Resolved Hospital Problem list     Assessment & Plan:  100 pack smoking history - Present   on Admit  Lymphocytic exude Pleural effusion on the right: - Present on Admit - Status post thoracentesis on 7/22  and 7/25: Lymphoticytic exudate Abnormal CT chest with apparent right lower lobe mass, mediastinal  adenopathy  -Plan for bronchoscopy with EBUS on 7/28 to evaluate the right lower lobe lesion, mediastinal adenopathy.  Coumadin has been on hold, vitamin K given, INR 1.3    Best Practice (right click and "Reselect all SmartList Selections" daily)   Diet/type: Regular consistency (see orders) DVT prophylaxis: Coumadin -. STOP 07/05/22 GI prophylaxis:  Lines: N/A Foley:  N/A Code Status:  full code Last date of multidisciplinary goals of care discussion [  ]   Updated patient and son and daughter 07/06/2022   SIGNATURE   Robert Byrum, MD, PhD 07/06/2022, 11:33 AM [facility] [phone] or if no answer before 7:00PM call [phone] For any issues after 7:00PM please call eLink [phone]     LABS   PULMONARY Recent Labs  Lab 07/05/22 0440  PHART 7.38  PCO2ART 35  PO2ART 67*  HCO3 20.7  O2SAT 94.1    CBC Recent Labs  Lab 07/04/22 0810 07/05/22 0427 07/06/22 0211  HGB 12.0 11.9* 11.2*  HCT 37.0 37.9 34.6*  WBC 6.7 6.9 7.3  PLT 216 220 219    COAGULATION Recent Labs  Lab 07/02/22 0406 07/03/22 0601 07/04/22 0810 07/05/22 0427 07/06/22 0211  INR 2.4* 2.6* 2.8* 2.7* 1.3*    CARDIAC  No results for input(s): "TROPONINI" in the last 168 hours. No results for input(s): "PROBNP" in the last 168 hours.   CHEMISTRY Recent Labs  Lab 06/30/22 0455 07/01/22 0406 07/02/22 0406 07/05/22 0803 07/06/22 0211  NA 138 137 135 135 134*  K 3.5 4.3 4.2 4.8 4.7  CL 106 107 107 110 107  CO2 26 25 22 20* 20*  GLUCOSE 114* 117* 113* 126* 113*  BUN 13 14 16 34* 41*  CREATININE 0.59 0.62 0.62 0.86 0.92  CALCIUM 9.9 10.1 9.8 9.6 9.7   Estimated Creatinine Clearance: 53.5 mL/min (by C-G formula based on SCr of 0.92 mg/dL).   LIVER Recent Labs  Lab 06/30/22 0455 07/01/22 0406 07/02/22 0406 07/03/22 0601 07/04/22 0810 07/05/22 0427 07/05/22 0803 07/06/22 0211  AST 17 16  --   --   --   --  13* 12*  ALT 15 13  --   --   --    --  10 10  ALKPHOS 76 74  --   --   --   --  57 58  BILITOT 0.7 0.7  --   --   --   --  0.7 0.9  PROT 6.5 6.4*  --   --   --   --  5.7* 5.6*  ALBUMIN 3.0* 2.8*  --   --   --   --  2.4* 2.3*  INR 2.5* 2.5* 2.4* 2.6* 2.8* 2.7*  --  1.3*       

## 2022-07-06 NOTE — Progress Notes (Signed)
PROGRESS NOTE    DAINE CROKER  DDU:[id number] DOB: 08/16/1946 DOA: 06/29/2022 PCP: Dettinger, [name], MD   Brief Narrative:  Jennfer Gassen  is a 76 y.o. female, with past medical history of A-fib, on Tikosyn and warfarin, hypertension, scleroderma, CAD status post PCI.  Diverticulitis/perforated sigmoid colon with colostomy, 2 diabetes mellitus, history of breast cancer. S/p thoracentesis.  CT scan shows lung mass.  Getting EBUS for definitive diagnosis.    Assessment & Plan:   Principal Problem:   Pleural effusion Active Problems:   Dyslipidemia, goal LDL below 70   Essential hypertension   Type 2 diabetes mellitus with complication, without long-term current use of insulin (HCC)   CAD S/P percutaneous coronary angioplasty   History of scleroderma   Persistent atrial fibrillation (HCC)   Rheumatoid arthritis (HCC)   Colostomy present (HCC)   S/P total knee arthroplasty, left   Right Pleural effusion -thoracentesis on 06/30/2022.   - exudative with significant amount of neutrophils.  - repeat chest x-ray shows once again moderate to large right pleural effusion, it appears that she has reaccumulated the fluid.  -PCCM consult.  CT scan shows lung mass with adrenal masses-- EBUS on Friday -MRI brain negative -have stopped her IV abx due to rash-- benadryl improving-- trial of steroids for improvement  Chronic A-fib with RVR. -Heart rate controlled, continue with metoprolol, Tikosyn. -warfarin held for EBUS-- plan to change to eliquis after discussion with family   Hypertension/CAD/hyperlipidemia: Blood pressure controlled.  Continue home medications which include amlodipine, lisinopril, Toprol-XL and atorvastatin. -holding home meds   History of scleroderma -Does not appear to be taking her hydroxychloroquine anymore as she has not been to her physician for refill.     DVT prophylaxis:   coumadin on hold   Code Status: Full Code  Family Communication:  daughter at  bedside 7/27  Status is: Inpatient Remains inpatient appropriate because: EBUS  Nutritional Assessment: Body mass index is 26.21 kg/m.Marland Kitchen Seen by dietician.  I agree with the assessment and plan as outlined below: Nutrition Status: Nutrition Problem: Inadequate oral intake Etiology: decreased appetite Signs/Symptoms: per patient/family report Interventions: Ensure Enlive (each supplement provides 350kcal and 20 grams of protein)  .  Consultants:  IR PCCM   Subjective: Would like to go home  Objective: Vitals:   07/05/22 1318 07/05/22 2141 07/06/22 0509 07/06/22 0930  BP: (!) 122/49 119/76 (!) 115/50 (!) 116/54  Pulse: 75 87 79 78  Resp: 16 16 19    Temp: 98.3 F (36.8 C) (!) 97.5 F (36.4 C) (!) 97.5 F (36.4 C)   TempSrc: Oral Oral Oral   SpO2: 97% 91% (!) 89%   Weight:      Height:        Intake/Output Summary (Last 24 hours) at 07/06/2022 1205 Last data filed at 07/05/2022 1600 Gross per 24 hour  Intake 45.67 ml  Output --  Net 45.67 ml   Filed Weights   06/29/22 1454 06/30/22 0600  Weight: 77.3 kg 75.9 kg    Examination:    General: Appearance:     Overweight female in no acute distress     Lungs:      respirations unlabored  Heart:    Normal heart rate.   MS:   All extremities are intact.   Neurologic:   Awake, alert, irritable        Data Reviewed: I have personally reviewed following labs and imaging studies  CBC: Recent Labs  Lab 06/29/22 1514 06/30/22 0455  07/02/22 0406 07/03/22 0601 07/04/22 0810 07/05/22 0427 07/06/22 0211  WBC 5.0   < > 6.1 5.9 6.7 6.9 7.3  NEUTROABS 3.7  --   --   --   --   --   --   HGB 13.0   < > 11.6* 12.5 12.0 11.9* 11.2*  HCT 40.7   < > 36.8 39.7 37.0 37.9 34.6*  MCV 87.5   < > 89.5 89.6 88.1 89.6 88.3  PLT 234   < > 196 212 216 220 219   < > = values in this interval not displayed.   Basic Metabolic Panel: Recent Labs  Lab 06/30/22 0455 07/01/22 0406 07/02/22 0406 07/05/22 0803 07/06/22 0211   NA 138 137 135 135 134*  K 3.5 4.3 4.2 4.8 4.7  CL 106 107 107 110 107  CO2 26 25 22  20* 20*  GLUCOSE 114* 117* 113* 126* 113*  BUN 13 14 16  34* 41*  CREATININE 0.59 0.62 0.62 0.86 0.92  CALCIUM 9.9 10.1 9.8 9.6 9.7   GFR: Estimated Creatinine Clearance: 53.5 mL/min (by C-G formula based on SCr of 0.92 mg/dL). Liver Function Tests: Recent Labs  Lab 06/30/22 0455 07/01/22 0406 07/05/22 0803 07/06/22 0211  AST 17 16 13* 12*  ALT 15 13 10 10   ALKPHOS 76 74 57 58  BILITOT 0.7 0.7 0.7 0.9  PROT 6.5 6.4* 5.7* 5.6*  ALBUMIN 3.0* 2.8* 2.4* 2.3*   No results for input(s): "LIPASE", "AMYLASE" in the last 168 hours. No results for input(s): "AMMONIA" in the last 168 hours. Coagulation Profile: Recent Labs  Lab 07/02/22 0406 07/03/22 0601 07/04/22 0810 07/05/22 0427 07/06/22 0211  INR 2.4* 2.6* 2.8* 2.7* 1.3*   Cardiac Enzymes: No results for input(s): "CKTOTAL", "CKMB", "CKMBINDEX", "TROPONINI" in the last 168 hours. BNP (last 3 results) No results for input(s): "PROBNP" in the last 8760 hours. HbA1C: No results for input(s): "HGBA1C" in the last 72 hours. CBG: Recent Labs  Lab 07/04/22 2224 07/05/22 0837 07/05/22 1137 07/05/22 2138 07/06/22 0736  GLUCAP 131* 116* 167* 137* 124*   Lipid Profile: No results for input(s): "CHOL", "HDL", "LDLCALC", "TRIG", "CHOLHDL", "LDLDIRECT" in the last 72 hours. Thyroid Function Tests: No results for input(s): "TSH", "T4TOTAL", "FREET4", "T3FREE", "THYROIDAB" in the last 72 hours. Anemia Panel: No results for input(s): "VITAMINB12", "FOLATE", "FERRITIN", "TIBC", "IRON", "RETICCTPCT" in the last 72 hours. Sepsis Labs: Recent Labs  Lab 07/01/22 0406  PROCALCITON 0.17    Recent Results (from the past 240 hour(s))  Body fluid culture w Gram Stain     Status: None   Collection Time: 06/30/22  2:00 PM   Specimen: PATH Cytology Pleural fluid  Result Value Ref Range Status   Specimen Description   Final    PLEURAL Performed  at [facility] [address], [place], [place] [address]    Special Requests   Final    PLEURAL Performed at [facility], [place] [address] [place], [place] [address]    Gram Stain   Final    FEW WBC PRESENT, PREDOMINANTLY MONONUCLEAR NO ORGANISMS SEEN    Culture   Final    NO GROWTH 3 DAYS Performed at [facility] [address] [place], [place] [address]    Report Status 07/04/2022 FINAL  Final  Body fluid culture w Gram Stain     Status: None (Preliminary result)   Collection Time: 07/03/22  4:09 PM   Specimen: PATH Cytology Pleural fluid  Result Value Ref Range Status   Specimen Description   Final    PLEURAL Performed at [facility] [address], [place], [place] [address]    Special Requests   Final    NONE Performed at [facility], [place] [address], [place], [place] [address]    Gram Stain NO WBC SEEN NO ORGANISMS SEEN   Final   Culture   Final    NO GROWTH 2 DAYS Performed at [facility], [place] [address] [place], [place] [address]    Report Status PENDING  Incomplete     Radiology Studies: DG CHEST PORT 1 VIEW  Result Date: 07/05/2022 CLINICAL DATA:  Right pleural effusion EXAM: PORTABLE CHEST 1 VIEW COMPARISON:  Chest x-ray dated June 03, 2022 FINDINGS: Visualized cardiac and mediastinal contours are unchanged. Right hemithorax volume loss, similar to prior. Increased opacification of the right hemithorax, likely due to increased pleural effusion or atelectasis. No evidence of pneumothorax. IMPRESSION: Increased opacification of the right hemithorax, likely due to increased pleural effusion or atelectasis. Electronically Signed   By: Yetta Glassman M.D.   On: 07/05/2022 08:14   MR BRAIN W WO CONTRAST  Result Date: 07/05/2022 CLINICAL DATA:  76 year old female with history of breast cancer, possible right lung mass. Staging. EXAM: MRI HEAD WITHOUT AND WITH CONTRAST  TECHNIQUE: Multiplanar, multiecho pulse sequences of the brain and surrounding structures were obtained without and with intravenous contrast. CONTRAST:  64mL GADAVIST GADOBUTROL 1 MMOL/ML IV SOLN COMPARISON:  Head CT 08/13/2015. FINDINGS: Brain: Mild motion artifact, including on postcontrast images. No restricted diffusion to suggest acute infarction. No midline shift, mass effect, evidence of mass lesion, ventriculomegaly, extra-axial collection or acute intracranial hemorrhage. Cervicomedullary junction and pituitary are within normal limits. No abnormal enhancement identified.  No dural thickening Some cerebral volume loss since 2016. Patchy, widely scattered bilateral cerebral white matter T2 and FLAIR hyperintensity. Similar signal heterogeneity in the pons. And a pontine chronic microhemorrhage is evident on series 9, image 15. No other chronic cerebral blood products identified. No definite cortical encephalomalacia. Deep gray matter nuclei and cerebellum appear spared. Vascular: Major intracranial vascular flow voids are preserved, the distal right vertebral artery appears dominant. The major dural venous sinuses are enhancing and appear to be patent. Skull and upper cervical spine: Prominent arachnoid granulation in the midline occipital bone (normal variant series 8, image 14 and series 7, image 12. No destructive bone lesion identified. Negative for age visible cervical spine. Sinuses/Orbits: Orbits appear negative aside from postoperative changes to both globes. Paranasal sinuses and mastoids are clear. Other: Visible internal auditory structures appear normal. Negative visible scalp and face. IMPRESSION: 1. Mildly motion degraded exam. No metastatic disease or acute intracranial abnormality identified. 2. Moderate for age signal changes in the cerebral white matter and pons, compatible with chronic small vessel disease. Electronically Signed   By: Genevie Ann M.D.   On: 07/05/2022 07:29    Scheduled  Meds:  atorvastatin  60 mg Oral Daily   dofetilide  250 mcg Oral BID   magnesium oxide  400 mg Oral BID   metoprolol succinate  12.5 mg Oral Daily   potassium chloride  10 mEq Oral BID   predniSONE  40 mg Oral Q breakfast   sodium chloride flush  10-40 mL Intracatheter Q12H   Continuous Infusions:     LOS: 4 days   Geradine Girt, DO [facility]  07/06/2022, 12:05 PM

## 2022-07-06 NOTE — Progress Notes (Signed)
 NAME:  Melody Casey, MRN:  [id number], DOB:  04/26/1946, LOS: 4 ADMISSION DATE:  06/29/2022, CONSULTATION DATE: 7/25 REFERRING MD: Dr. Pahwani, CHIEF COMPLAINT: Pleural effusion  brief  76-year-old female with past medical history as below, which is significant for A-fib on Tikosyn and warfarin, hypertension, scleroderma, and coronary artery disease.  Recent course significant for pneumonia on the tail end of May treated with antibiotics and she seemed to recover adequately.  But then did develop progressive shortness of breath which was slowly progressive over period of weeks ultimately prompting her to present to [facility] on 7/17, but left without being seen.  She has been taking Lasix with no improvement in symptoms.  She presented to [facility] emergency department on 7/21 with complains again of progressive shortness of breath.  Chest x-ray demonstrated a pleural effusion, which was known, however, was larger.  She was admitted to the hospitalist service at [facility] and underwent thoracentesis.  She had significant symptomatic relief status post thoracentesis.  Pleural fluid consistent with exudative process and neutrophil predominance.  Unfortunately the pleural effusion recurred and PCCM was consulted on 7/25 for further management.  Pertinent  Medical History   has a past medical history of AAA (abdominal aortic aneurysm) (HCC), Acute diverticulitis, Anxiety, Arthritis, Atrial fibrillation (HCC), CAD (coronary artery disease), Cancer (HCC), Diabetes mellitus, Diverticulitis, Diverticulitis of colon ( ), Dysrhythmia, Foot deformity (1947), Hemangioma, HLD (hyperlipidemia), HTN (hypertension), Osteoporosis, Tobacco abuse, Ureteral obstruction, and Wears dentures.   reports that she quit smoking about 11 years ago. Her smoking use included cigarettes. She has a 100.00 pack-year smoking history. She has never used smokeless tobacco.   Significant Hospital Events: Including  procedures, antibiotic start and stop dates in addition to other pertinent events    06/29/2022 - admit 7/22 Rt thora #1 LDH 175, 73% lymphs, , mesotherlia cless 725/23 CCM consult Repeat thora #2 - LDH 262, Cytop PENDING 7/26 - cT chest with RLL Mass, Significant mediastinal adenompaty. 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 She is upset. Her husband  just dided at [place] in June 2023 and she is still in grief. Daughter and son lve in [place] but are visiting. No new compliants. Feels deconditioned.  Is on coumadin for A Fib - INr > 2 Has cough  is on ace inihibitor - stopped Dr Keighan Amezcua can do EBUS Friday 07/06/22 - patient posted for this  Interim History / Subjective:   Comfortable.  Remains on supplemental oxygen INR has normalized   Objective   Blood pressure (!) 116/54, pulse 78, temperature (!) 97.5 F (36.4 C), temperature source Oral, resp. rate 19, height 5' 7" (1.702 m), weight 75.9 kg, SpO2 (!) 89 %.        Intake/Output Summary (Last 24 hours) at 07/06/2022 1133 Last data filed at 07/05/2022 1600 Gross per 24 hour  Intake 45.67 ml  Output --  Net 45.67 ml   Filed Weights   06/29/22 1454 06/30/22 0600  Weight: 77.3 kg 75.9 kg    Examination: General Appearance: Elderly woman in no distress Head: Normocephalic Eyes: Pupils equal Throat: Some expiratory upper airway noise, stridor with cough Lungs: Bilateral scattered rhonchi more so on the right Heart: Regular, distant, no murmur Abdomen: Nondistended with positive bowel sounds Genitalia / Rectal: Deferred Extremities: No edema Skin: Scattered erythema Neurologic: Awake, alert, appropriate, nonfocal   Resolved Hospital Problem list     Assessment & Plan:  100 pack smoking history - Present   on Admit  Lymphocytic exude Pleural effusion on the right: - Present on Admit - Status post thoracentesis on 7/22  and 7/25: Lymphoticytic exudate Abnormal CT chest with apparent right lower lobe mass, mediastinal  adenopathy  -Plan for bronchoscopy with EBUS on 7/28 to evaluate the right lower lobe lesion, mediastinal adenopathy.  Coumadin has been on hold, vitamin K given, INR 1.3    Best Practice (right click and "Reselect all SmartList Selections" daily)   Diet/type: Regular consistency (see orders) DVT prophylaxis: Coumadin -. STOP 07/05/22 GI prophylaxis:  Lines: N/A Foley:  N/A Code Status:  full code Last date of multidisciplinary goals of care discussion [  ]   Updated patient and son and daughter 07/06/2022   SIGNATURE   Armella Stogner, MD, PhD 07/06/2022, 11:33 AM [facility] [phone] or if no answer before 7:00PM call [phone] For any issues after 7:00PM please call eLink [phone]     LABS   PULMONARY Recent Labs  Lab 07/05/22 0440  PHART 7.38  PCO2ART 35  PO2ART 67*  HCO3 20.7  O2SAT 94.1    CBC Recent Labs  Lab 07/04/22 0810 07/05/22 0427 07/06/22 0211  HGB 12.0 11.9* 11.2*  HCT 37.0 37.9 34.6*  WBC 6.7 6.9 7.3  PLT 216 220 219    COAGULATION Recent Labs  Lab 07/02/22 0406 07/03/22 0601 07/04/22 0810 07/05/22 0427 07/06/22 0211  INR 2.4* 2.6* 2.8* 2.7* 1.3*    CARDIAC  No results for input(s): "TROPONINI" in the last 168 hours. No results for input(s): "PROBNP" in the last 168 hours.   CHEMISTRY Recent Labs  Lab 06/30/22 0455 07/01/22 0406 07/02/22 0406 07/05/22 0803 07/06/22 0211  NA 138 137 135 135 134*  K 3.5 4.3 4.2 4.8 4.7  CL 106 107 107 110 107  CO2 26 25 22 20* 20*  GLUCOSE 114* 117* 113* 126* 113*  BUN 13 14 16 34* 41*  CREATININE 0.59 0.62 0.62 0.86 0.92  CALCIUM 9.9 10.1 9.8 9.6 9.7   Estimated Creatinine Clearance: 53.5 mL/min (by C-G formula based on SCr of 0.92 mg/dL).   LIVER Recent Labs  Lab 06/30/22 0455 07/01/22 0406 07/02/22 0406 07/03/22 0601 07/04/22 0810 07/05/22 0427 07/05/22 0803 07/06/22 0211  AST 17 16  --   --   --   --  13* 12*  ALT 15 13  --   --   --    --  10 10  ALKPHOS 76 74  --   --   --   --  57 58  BILITOT 0.7 0.7  --   --   --   --  0.7 0.9  PROT 6.5 6.4*  --   --   --   --  5.7* 5.6*  ALBUMIN 3.0* 2.8*  --   --   --   --  2.4* 2.3*  INR 2.5* 2.5* 2.4* 2.6* 2.8* 2.7*  --  1.3*       

## 2022-07-06 NOTE — Anesthesia Procedure Notes (Signed)
Procedure Name: Intubation Date/Time: 07/06/2022 2:39 PM  Performed by: Sharlette Dense, CRNAPre-anesthesia Checklist: Patient identified, Emergency Drugs available, Suction available and Patient being monitored Patient Re-evaluated:Patient Re-evaluated prior to induction Oxygen Delivery Method: Circle system utilized Preoxygenation: Pre-oxygenation with 100% oxygen Induction Type: IV induction Ventilation: Mask ventilation without difficulty Laryngoscope Size: Miller and 2 Grade View: Grade I Tube type: Oral Tube size: 9.0 mm Number of attempts: 1 Airway Equipment and Method: Stylet and Oral airway Placement Confirmation: ETT inserted through vocal cords under direct vision, positive ETCO2 and breath sounds checked- equal and bilateral Secured at: 22 cm Tube secured with: Tape Dental Injury: Teeth and Oropharynx as per pre-operative assessment

## 2022-07-06 NOTE — Anesthesia Preprocedure Evaluation (Signed)
Anesthesia Evaluation  Patient identified by MRN, date of birth, ID band Patient awake    Reviewed: Allergy & Precautions, NPO status , Patient's Chart, lab work & pertinent test results  Airway Mallampati: II  TM Distance: >3 FB Neck ROM: Full    Dental no notable dental hx.    Pulmonary neg pulmonary ROS, former smoker,    Pulmonary exam normal breath sounds clear to auscultation       Cardiovascular hypertension, Pt. on medications and Pt. on home beta blockers + CAD, + Cardiac Stents and + Peripheral Vascular Disease  Normal cardiovascular exam+ dysrhythmias Atrial Fibrillation  Rhythm:Regular Rate:Normal     Neuro/Psych Anxiety Depression TIAnegative psych ROS   GI/Hepatic negative GI ROS, Neg liver ROS,   Endo/Other  negative endocrine ROSdiabetes, Type 2  Renal/GU negative Renal ROS  negative genitourinary   Musculoskeletal  (+) Arthritis , Osteoarthritis,    Abdominal   Peds negative pediatric ROS (+)  Hematology negative hematology ROS (+)   Anesthesia Other Findings   Reproductive/Obstetrics negative OB ROS                             Anesthesia Physical  Anesthesia Plan  ASA: 3  Anesthesia Plan: General   Post-op Pain Management: Minimal or no pain anticipated   Induction: Intravenous  PONV Risk Score and Plan: 3 and Ondansetron, Dexamethasone, Treatment may vary due to age or medical condition and Midazolam  Airway Management Planned: Oral ETT  Additional Equipment:   Intra-op Plan:   Post-operative Plan: Extubation in OR  Informed Consent: I have reviewed the patients History and Physical, chart, labs and discussed the procedure including the risks, benefits and alternatives for the proposed anesthesia with the patient or authorized representative who has indicated his/her understanding and acceptance.     Dental advisory given  Plan Discussed with: CRNA  and Surgeon  Anesthesia Plan Comments: (See PAT note 12/25/2021, Konrad Felix Ward, PA-C)        Anesthesia Quick Evaluation

## 2022-07-06 NOTE — Transfer of Care (Signed)
Immediate Anesthesia Transfer of Care Note  Patient: Melody Casey  Procedure(s) Performed: ENDOBRONCHIAL ULTRASOUND (Bilateral) VIDEO BRONCHOSCOPY WITHOUT FLUORO FINE NEEDLE ASPIRATION (FNA) LINEAR BRONCHIAL BRUSHINGS BIOPSY  Patient Location: Endoscopy Unit  Anesthesia Type:General  Level of Consciousness: drowsy  Airway & Oxygen Therapy: Patient Spontanous Breathing and Patient connected to face mask oxygen  Post-op Assessment: Report given to RN and Post -op Vital signs reviewed and stable  Post vital signs: Reviewed and stable  Last Vitals:  Vitals Value Taken Time  BP 159/48 07/06/22 1550  Temp    Pulse 92 07/06/22 1551  Resp 19 07/06/22 1551  SpO2 92 % 07/06/22 1551  Vitals shown include unvalidated device data.  Last Pain:  Vitals:   07/06/22 1424  TempSrc: Oral  PainSc: 0-No pain         Complications: No notable events documented.

## 2022-07-07 DIAGNOSIS — R918 Other nonspecific abnormal finding of lung field: Secondary | ICD-10-CM | POA: Diagnosis not present

## 2022-07-07 LAB — BASIC METABOLIC PANEL
Anion gap: 6 (ref 5–15)
BUN: 54 mg/dL — ABNORMAL HIGH (ref 8–23)
CO2: 19 mmol/L — ABNORMAL LOW (ref 22–32)
Calcium: 9.4 mg/dL (ref 8.9–10.3)
Chloride: 107 mmol/L (ref 98–111)
Creatinine, Ser: 1.1 mg/dL — ABNORMAL HIGH (ref 0.44–1.00)
GFR, Estimated: 51 mL/min — ABNORMAL LOW (ref >60)
Glucose, Bld: 166 mg/dL — ABNORMAL HIGH (ref 70–99)
Potassium: 6 mmol/L — ABNORMAL HIGH (ref 3.5–5.1)
Sodium: 132 mmol/L — ABNORMAL LOW (ref 135–145)

## 2022-07-07 LAB — PROTIME-INR
INR: 1.2 (ref 0.8–1.2)
Prothrombin Time: 14.9 seconds (ref 11.4–15.2)

## 2022-07-07 LAB — QUANTIFERON-TB GOLD PLUS (RQFGPL)
QuantiFERON Mitogen Value: 1.02 IU/mL
QuantiFERON Nil Value: 0.04 IU/mL
QuantiFERON TB1 Ag Value: 0.07 IU/mL
QuantiFERON TB2 Ag Value: 0.06 IU/mL

## 2022-07-07 LAB — BODY FLUID CULTURE W GRAM STAIN
Culture: NO GROWTH
Gram Stain: NONE SEEN

## 2022-07-07 LAB — CBC
HCT: 34.3 % — ABNORMAL LOW (ref 36.0–46.0)
Hemoglobin: 10.8 g/dL — ABNORMAL LOW (ref 12.0–15.0)
MCH: 28.1 pg (ref 26.0–34.0)
MCHC: 31.5 g/dL (ref 30.0–36.0)
MCV: 89.1 fL (ref 80.0–100.0)
Platelets: 208 10*3/uL (ref 150–400)
RBC: 3.85 MIL/uL — ABNORMAL LOW (ref 3.87–5.11)
RDW: 15 % (ref 11.5–15.5)
WBC: 5.7 10*3/uL (ref 4.0–10.5)
nRBC: 0 % (ref 0.0–0.2)

## 2022-07-07 LAB — GLUCOSE, CAPILLARY
Glucose-Capillary: 139 mg/dL — ABNORMAL HIGH (ref 70–99)
Glucose-Capillary: 213 mg/dL — ABNORMAL HIGH (ref 70–99)
Glucose-Capillary: 241 mg/dL — ABNORMAL HIGH (ref 70–99)
Glucose-Capillary: 249 mg/dL — ABNORMAL HIGH (ref 70–99)

## 2022-07-07 LAB — QUANTIFERON-TB GOLD PLUS: QuantiFERON-TB Gold Plus: NEGATIVE

## 2022-07-07 MED ORDER — APIXABAN 5 MG PO TABS
5.0000 mg | ORAL_TABLET | Freq: Two times a day (BID) | ORAL | Status: DC
  Administered 2022-07-07 – 2022-07-09 (×5): 5 mg via ORAL
  Filled 2022-07-07 (×5): qty 1

## 2022-07-07 NOTE — Plan of Care (Signed)
°  Problem: Clinical Measurements: °Goal: Cardiovascular complication will be avoided °Outcome: Progressing °  °Problem: Activity: °Goal: Risk for activity intolerance will decrease °Outcome: Progressing °  °

## 2022-07-07 NOTE — Progress Notes (Signed)
SATURATION QUALIFICATIONS: (This note is used to comply with regulatory documentation for home oxygen)  Patient Saturations on Room Air at Rest = 96%  Patient Saturations on Room Air while Ambulating = 90%  Patient Saturations on 0 Liters of oxygen while Ambulating = 90%  Please briefly explain why patient needs home oxygen: No O2 needed

## 2022-07-07 NOTE — Progress Notes (Signed)
PROGRESS NOTE    Melody Casey  DEY:[id number] DOB: 27-Jun-1946 DOA: 06/29/2022 PCP: Dettinger, [name], MD   Brief Narrative:  Melody Casey  is a 76 y.o. female, with past medical history of A-fib, on Tikosyn and warfarin, hypertension, scleroderma, CAD status post PCI.  Diverticulitis/perforated sigmoid colon with colostomy, 2 diabetes mellitus, history of breast cancer. S/p thoracentesis.  CT scan shows lung mass.  S/p EBUS.      Assessment & Plan:   Principal Problem:   Right lower lobe lung mass Active Problems:   Dyslipidemia, goal LDL below 70   Essential hypertension   Type 2 diabetes mellitus with complication, without long-term current use of insulin (HCC)   CAD S/P percutaneous coronary angioplasty   History of scleroderma   Persistent atrial fibrillation (HCC)   Rheumatoid arthritis (HCC)   Colostomy present (HCC)   S/P total knee arthroplasty, left   Pleural effusion   Mediastinal adenopathy   Right Pleural effusion -thoracentesis on 06/30/2022, 7/25   - exudative with significant amount of neutrophils.  -PCCM consult.  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 Need for anther thoracentesis  Chronic A-fib with RVR. -Heart rate controlled, continue with metoprolol, Tikosyn. -warfarin held for EBUS--  change to eliquis after discussion with family   Hypertension/CAD/hyperlipidemia: Blood pressure controlled.  Continue home medications which include amlodipine, lisinopril, Toprol-XL and atorvastatin. -holding home meds   History of scleroderma -Does not appear to be taking her hydroxychloroquine anymore as she has not been to her physician for refill.     DVT prophylaxis:   eliquis   Code Status: Full Code  Family Communication:  daughter at  bedside 7/29  Status is: Inpatient Remains inpatient appropriate because: EBUS  Nutritional Assessment: Body mass index is 26.21 kg/m.Marland Kitchen Seen by dietician.  I agree with the assessment and plan as outlined below: Nutrition Status: Nutrition Problem: Inadequate oral intake Etiology: decreased appetite Signs/Symptoms: per patient/family report Interventions: Ensure Enlive (each supplement provides 350kcal and 20 grams of protein)  .  Consultants:  IR PCCM   Subjective: Back rash still itching  Objective: Vitals:   07/06/22 1645 07/06/22 2039 07/07/22 0501 07/07/22 1008  BP:  (!) 127/55 101/80   Pulse:  78 71   Resp:  17 16   Temp:  97.9 F (36.6 C)    TempSrc:  Oral    SpO2: 92% 93% 92% 96%  Weight:      Height:        Intake/Output Summary (Last 24 hours) at 07/07/2022 1144 Last data filed at 07/06/2022 1551 Gross per 24 hour  Intake 400 ml  Output --  Net 400 ml   Filed Weights   06/29/22 1454 06/30/22 0600 07/06/22 1424  Weight: 77.3 kg 75.9 kg 75.9 kg    Examination:     General: Appearance:     Overweight female in no acute distress     Lungs:    Not on O2, diminished on right base, respirations unlabored  Heart:    Normal heart rate.   MS:   All extremities are intact.   Neurologic:   Awake, alert, oriented x 3. No apparent focal neurological           defect.  Data Reviewed: I have personally reviewed following labs and imaging studies  CBC: Recent Labs  Lab 07/03/22 0601 07/04/22 0810 07/05/22 0427 07/06/22 0211 07/07/22 0433  WBC 5.9 6.7 6.9 7.3 5.7  HGB 12.5 12.0 11.9* 11.2* 10.8*  HCT 39.7 37.0 37.9 34.6* 34.3*  MCV 89.6 88.1 89.6 88.3 89.1  PLT 212 216 220 219 517   Basic Metabolic Panel: Recent Labs  Lab 07/01/22 0406 07/02/22 0406 07/05/22 0803 07/06/22 0211 07/07/22 0433  NA 137 135 135 134* 132*  K 4.3 4.2 4.8 4.7 6.0*  CL 107 107 110 107 107  CO2 25 22 20* 20* 19*  GLUCOSE 117* 113* 126* 113* 166*  BUN  14 16 34* 41* 54*  CREATININE 0.62 0.62 0.86 0.92 1.10*  CALCIUM 10.1 9.8 9.6 9.7 9.4   GFR: Estimated Creatinine Clearance: 44.8 mL/min (A) (by C-G formula based on SCr of 1.1 mg/dL (H)). Liver Function Tests: Recent Labs  Lab 07/01/22 0406 07/05/22 0803 07/06/22 0211  AST 16 13* 12*  ALT 13 10 10   ALKPHOS 74 57 58  BILITOT 0.7 0.7 0.9  PROT 6.4* 5.7* 5.6*  ALBUMIN 2.8* 2.4* 2.3*   No results for input(s): "LIPASE", "AMYLASE" in the last 168 hours. No results for input(s): "AMMONIA" in the last 168 hours. Coagulation Profile: Recent Labs  Lab 07/03/22 0601 07/04/22 0810 07/05/22 0427 07/06/22 0211 07/07/22 0433  INR 2.6* 2.8* 2.7* 1.3* 1.2   Cardiac Enzymes: No results for input(s): "CKTOTAL", "CKMB", "CKMBINDEX", "TROPONINI" in the last 168 hours. BNP (last 3 results) No results for input(s): "PROBNP" in the last 8760 hours. HbA1C: No results for input(s): "HGBA1C" in the last 72 hours. CBG: Recent Labs  Lab 07/06/22 1207 07/06/22 1651 07/06/22 2120 07/07/22 0725 07/07/22 1134  GLUCAP 136* 222* 238* 139* 213*   Lipid Profile: No results for input(s): "CHOL", "HDL", "LDLCALC", "TRIG", "CHOLHDL", "LDLDIRECT" in the last 72 hours. Thyroid Function Tests: No results for input(s): "TSH", "T4TOTAL", "FREET4", "T3FREE", "THYROIDAB" in the last 72 hours. Anemia Panel: No results for input(s): "VITAMINB12", "FOLATE", "FERRITIN", "TIBC", "IRON", "RETICCTPCT" in the last 72 hours. Sepsis Labs: Recent Labs  Lab 07/01/22 0406  PROCALCITON 0.17    Recent Results (from the past 240 hour(s))  Body fluid culture w Gram Stain     Status: None   Collection Time: 06/30/22  2:00 PM   Specimen: PATH Cytology Pleural fluid  Result Value Ref Range Status   Specimen Description   Final    PLEURAL Performed at [facility] [address], [place], [place] [address]    Special Requests   Final    PLEURAL Performed at [facility],  [place] [address] [place], [place] [address]    Gram Stain   Final    FEW WBC PRESENT, PREDOMINANTLY MONONUCLEAR NO ORGANISMS SEEN    Culture   Final    NO GROWTH 3 DAYS Performed at [facility] [address] [place], [place] [address]    Report Status 07/04/2022 FINAL  Final  Body fluid culture w Gram Stain     Status: None   Collection Time: 07/03/22  4:09 PM   Specimen: PATH Cytology Pleural fluid  Result Value Ref Range Status   Specimen Description   Final    PLEURAL Performed at [facility] [address] [place], [place] [address]    Special Requests   Final    NONE Performed at [facility], [address] [place], [place] [address]    Gram Stain NO WBC SEEN NO ORGANISMS SEEN   Final   Culture   Final    NO GROWTH 3 DAYS Performed at [facility], [place] [address], [place], [place] [address]    Report Status 07/07/2022 FINAL  Final     Radiology Studies: No results found.  Scheduled Meds:  apixaban  5 mg Oral BID   atorvastatin  60 mg Oral Daily   dofetilide  250 mcg Oral BID   magnesium oxide  400 mg Oral BID   metoprolol succinate  12.5 mg Oral Daily   predniSONE  40 mg Oral Q breakfast   sodium chloride flush  10-40 mL Intracatheter Q12H   Continuous Infusions:     LOS: 5 days   Geradine Girt, DO [facility]  07/07/2022, 11:44 AM

## 2022-07-08 ENCOUNTER — Inpatient Hospital Stay (HOSPITAL_COMMUNITY): Payer: Medicare Other

## 2022-07-08 DIAGNOSIS — R918 Other nonspecific abnormal finding of lung field: Secondary | ICD-10-CM | POA: Diagnosis not present

## 2022-07-08 LAB — BASIC METABOLIC PANEL
Anion gap: 6 (ref 5–15)
BUN: 70 mg/dL — ABNORMAL HIGH (ref 8–23)
CO2: 19 mmol/L — ABNORMAL LOW (ref 22–32)
Calcium: 9.8 mg/dL (ref 8.9–10.3)
Chloride: 108 mmol/L (ref 98–111)
Creatinine, Ser: 1.27 mg/dL — ABNORMAL HIGH (ref 0.44–1.00)
GFR, Estimated: 43 mL/min — ABNORMAL LOW (ref >60)
Glucose, Bld: 159 mg/dL — ABNORMAL HIGH (ref 70–99)
Potassium: 5.5 mmol/L — ABNORMAL HIGH (ref 3.5–5.1)
Sodium: 133 mmol/L — ABNORMAL LOW (ref 135–145)

## 2022-07-08 LAB — GLUCOSE, CAPILLARY
Glucose-Capillary: 129 mg/dL — ABNORMAL HIGH (ref 70–99)
Glucose-Capillary: 108 mg/dL — ABNORMAL HIGH (ref 70–99)
Glucose-Capillary: 186 mg/dL — ABNORMAL HIGH (ref 70–99)
Glucose-Capillary: 281 mg/dL — ABNORMAL HIGH (ref 70–99)

## 2022-07-08 MED ORDER — SODIUM ZIRCONIUM CYCLOSILICATE 5 G PO PACK
5.0000 g | PACK | Freq: Once | ORAL | Status: AC
  Administered 2022-07-08: 5 g via ORAL
  Filled 2022-07-08: qty 1

## 2022-07-08 NOTE — Progress Notes (Signed)
PROGRESS NOTE    Melody Casey  SAY:[id number] DOB: [birth year] DOA: 06/29/2022 PCP: Dettinger, [name], MD   Brief Narrative:  Melody Casey  is a 76 y.o. female, with past medical history of A-fib, on Tikosyn and warfarin, hypertension, scleroderma, CAD status post PCI.  Diverticulitis/perforated sigmoid colon with colostomy, 2 diabetes mellitus, history of breast cancer. S/p thoracentesis.  CT scan shows lung mass.  S/p EBUS.      Assessment & Plan:   Principal Problem:   Right lower lobe lung mass Active Problems:   Dyslipidemia, goal LDL below 70   Essential hypertension   Type 2 diabetes mellitus with complication, without long-term current use of insulin (HCC)   CAD S/P percutaneous coronary angioplasty   History of scleroderma   Persistent atrial fibrillation (HCC)   Rheumatoid arthritis (HCC)   Colostomy present (HCC)   S/P total knee arthroplasty, left   Pleural effusion   Mediastinal adenopathy   Right Pleural effusion -thoracentesis on 06/30/2022, 7/25   - exudative with significant amount of neutrophils.  -PCCM consult.  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 -Heart rate controlled, continue with metoprolol, Tikosyn. -warfarin held for EBUS--  change to eliquis after discussion with family   Hypertension/CAD/hyperlipidemia: Blood pressure controlled.   History of scleroderma -Does not appear to be taking her hydroxychloroquine anymore as she has not been to her physician for refill.  Hyperkalemia -stopped Po replacement    DVT prophylaxis:   eliquis   Code Status: Full Code  Family Communication:  daughter at bedside  7/29  Status is: Inpatient Remains inpatient appropriate because: EBUS  Nutritional Assessment: Body mass index is 26.21 kg/m.Marland Kitchen Seen by dietician.  I agree with the assessment and plan as outlined below: Nutrition Status: Nutrition Problem: Inadequate oral intake Etiology: decreased appetite Signs/Symptoms: per patient/family report Interventions: Ensure Enlive (each supplement provides 350kcal and 20 grams of protein)  .  Consultants:  IR PCCM   Subjective: Did not sleep well  Objective: Vitals:   07/07/22 1226 07/07/22 1500 07/07/22 2126 07/08/22 0548  BP: (!) 130/57  (!) 137/48 137/65  Pulse: 65  80 67  Resp: 18  18   Temp: (!) 97.5 F (36.4 C)  97.8 F (36.6 C) 97.8 F (36.6 C)  TempSrc: Oral  Oral Oral  SpO2: 94% 90% 92% 93%  Weight:      Height:       No intake or output data in the 24 hours ending 07/08/22 1419  Filed Weights   06/29/22 1454 06/30/22 0600 07/06/22 1424  Weight: 77.3 kg 75.9 kg 75.9 kg    Examination:     General: Appearance:     Overweight female in no acute distress     Lungs:      respirations unlabored, diminished  Heart:    Normal heart rate.   MS:   All extremities are intact.   Neurologic:   Awake, alert           Data Reviewed: I have personally reviewed following labs and imaging studies  CBC: Recent Labs  Lab 07/03/22 0601 07/04/22 0810 07/05/22 0427 07/06/22 0211 07/07/22  0433  WBC 5.9 6.7 6.9 7.3 5.7  HGB 12.5 12.0 11.9* 11.2* 10.8*  HCT 39.7 37.0 37.9 34.6* 34.3*  MCV 89.6 88.1 89.6 88.3 89.1  PLT 212 216 220 219 341   Basic Metabolic Panel: Recent Labs  Lab 07/02/22 0406 07/05/22 0803 07/06/22 0211 07/07/22 0433 07/08/22 0406  NA 135 135 134* 132* 133*  K 4.2 4.8 4.7 6.0* 5.5*  CL 107 110 107 107 108  CO2 22 20* 20* 19* 19*  GLUCOSE 113* 126* 113* 166* 159*  BUN 16 34* 41* 54* 70*  CREATININE 0.62 0.86 0.92 1.10* 1.27*  CALCIUM 9.8 9.6 9.7 9.4 9.8   GFR: Estimated Creatinine  Clearance: 38.8 mL/min (A) (by C-G formula based on SCr of 1.27 mg/dL (H)). Liver Function Tests: Recent Labs  Lab 07/05/22 0803 07/06/22 0211  AST 13* 12*  ALT 10 10  ALKPHOS 57 58  BILITOT 0.7 0.9  PROT 5.7* 5.6*  ALBUMIN 2.4* 2.3*   No results for input(s): "LIPASE", "AMYLASE" in the last 168 hours. No results for input(s): "AMMONIA" in the last 168 hours. Coagulation Profile: Recent Labs  Lab 07/03/22 0601 07/04/22 0810 07/05/22 0427 07/06/22 0211 07/07/22 0433  INR 2.6* 2.8* 2.7* 1.3* 1.2   Cardiac Enzymes: No results for input(s): "CKTOTAL", "CKMB", "CKMBINDEX", "TROPONINI" in the last 168 hours. BNP (last 3 results) No results for input(s): "PROBNP" in the last 8760 hours. HbA1C: No results for input(s): "HGBA1C" in the last 72 hours. CBG: Recent Labs  Lab 07/07/22 1134 07/07/22 1721 07/07/22 2121 07/08/22 0717 07/08/22 1132  GLUCAP 213* 241* 249* 129* 108*   Lipid Profile: No results for input(s): "CHOL", "HDL", "LDLCALC", "TRIG", "CHOLHDL", "LDLDIRECT" in the last 72 hours. Thyroid Function Tests: No results for input(s): "TSH", "T4TOTAL", "FREET4", "T3FREE", "THYROIDAB" in the last 72 hours. Anemia Panel: No results for input(s): "VITAMINB12", "FOLATE", "FERRITIN", "TIBC", "IRON", "RETICCTPCT" in the last 72 hours. Sepsis Labs: No results for input(s): "PROCALCITON", "LATICACIDVEN" in the last 168 hours.   Recent Results (from the past 240 hour(s))  Body fluid culture w Gram Stain     Status: None   Collection Time: 06/30/22  2:00 PM   Specimen: PATH Cytology Pleural fluid  Result Value Ref Range Status   Specimen Description   Final    PLEURAL Performed at [facility] [address], [place], [place] [address]    Special Requests   Final    PLEURAL Performed at [facility], [place] [address] [place], [place] [address]    Gram Stain   Final    FEW WBC PRESENT, PREDOMINANTLY MONONUCLEAR NO ORGANISMS  SEEN    Culture   Final    NO GROWTH 3 DAYS Performed at [facility] [address], [place], [place] [address]    Report Status 07/04/2022 FINAL  Final  Body fluid culture w Gram Stain     Status: None   Collection Time: 07/03/22  4:09 PM   Specimen: PATH Cytology Pleural fluid  Result Value Ref Range Status   Specimen Description   Final    PLEURAL Performed at [facility] [address], [place], [place] [address]    Special Requests   Final    NONE Performed at [facility], [place] [address] [place], [place] [address]    Gram Stain NO WBC SEEN NO ORGANISMS SEEN   Final   Culture   Final    NO GROWTH 3 DAYS  Performed at [facility], [place] [address], [place],  [address]    Report Status 07/07/2022 FINAL  Final     Radiology Studies: DG CHEST PORT 1 VIEW  Result Date: 07/08/2022 CLINICAL DATA:  76 year old female presents for a value to a shin of RIGHT lower lobe mass. EXAM: PORTABLE CHEST 1 VIEW COMPARISON:  July 05, 2022 in July 03, 2022 FINDINGS: Trachea midline. Cardiomediastinal contours and hilar structures are stable though RIGHT heart border and hilum is obscured at again without change due to masslike area in the RIGHT lower chest better visualized on previous CT likely associated with small RIGHT-sided pleural effusion. No pneumothorax. LEFT chest is clear. On limited assessment no acute skeletal findings. IMPRESSION: 1. Masslike area in the RIGHT lower chest likely associated with small RIGHT-sided pleural effusion. Findings remain highly suspicious for neoplasm and or better assessed on recent chest CT. 2. No change in the appearance of the chest over the short interval. Electronically Signed   By: Zetta Bills M.D.   On: 07/08/2022 08:29    Scheduled Meds:  apixaban  5 mg Oral BID   atorvastatin  60 mg Oral Daily   dofetilide  250 mcg Oral BID   magnesium oxide  400 mg Oral BID   metoprolol succinate   12.5 mg Oral Daily   predniSONE  40 mg Oral Q breakfast   sodium chloride flush  10-40 mL Intracatheter Q12H   Continuous Infusions:     LOS: 6 days   Geradine Girt, DO [facility]  07/08/2022, 2:19 PM

## 2022-07-08 NOTE — Progress Notes (Signed)
NAME:  Melody Casey, MRN:  [id number], DOB:  1945-12-25, LOS: 6 ADMISSION DATE:  06/29/2022, CONSULTATION DATE: 7/25 REFERRING MD: Dr. Doristine Bosworth, CHIEF COMPLAINT: Pleural effusion  brief  76 year old female with past medical history as below, which is significant for A-fib on Tikosyn and warfarin, hypertension, scleroderma, and coronary artery disease.  Recent course significant for pneumonia on the tail end of May treated with antibiotics and she seemed to recover adequately.  But then did develop progressive shortness of breath which was slowly progressive over period of weeks ultimately prompting her to present to [facility] on 7/17, but left without being seen.  She has been taking Lasix with no improvement in symptoms.  She presented to [facility] emergency department on 7/21 with complains again of progressive shortness of breath.  Chest x-ray demonstrated a pleural effusion, which was known, however, was larger.  She was admitted to the hospitalist service at [facility] and underwent thoracentesis.  She had significant symptomatic relief status post thoracentesis.  Pleural fluid consistent with exudative process and neutrophil predominance.  Unfortunately the pleural effusion recurred and PCCM was consulted on 7/25 for further management.  Pertinent  Medical History   has a past medical history of AAA (abdominal aortic aneurysm) ([place]), Acute diverticulitis, Anxiety, Arthritis, Atrial fibrillation ([place]), CAD (coronary artery disease), Cancer ([place]), Diabetes mellitus, Diverticulitis, Diverticulitis of colon ( ), Dysrhythmia, Foot deformity (1947), Hemangioma, HLD (hyperlipidemia), HTN (hypertension), Osteoporosis, Tobacco abuse, Ureteral obstruction, and Wears dentures.   reports that she quit smoking about 11 years ago. Her smoking use included cigarettes. She has a 100.00 pack-year smoking history. She has never used smokeless tobacco.   Significant Hospital Events: Including  procedures, antibiotic start and stop dates in addition to other pertinent events    06/29/2022 - admit 7/22 Rt thora #1 LDH 175, 73% lymphs, , mesotherlia cless 725/23 CCM consult Repeat thora #2 - LDH 262, Cytop PENDING 7/26 - cT chest with RLL Mass, Significant mediastinal adenompaty. 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 She is upset. Her husband  just dided at [employer] in June 2023 and she is still in grief. Daughter and son lve in [place] but are visiting. No new compliants. Feels deconditioned.  Is on coumadin for A Fib - INr > 2 Has cough  is on ace inihibitor - stopped 7/28 Endobronchial ultrasound by Dr. Lamonte Sakai on 7/28   Interim History / Subjective:   Respiratory status is stable.  She is on room air now.   Objective   Blood pressure 132/65, pulse 69, temperature 97.8 F (36.6 C), temperature source Oral, resp. rate 18, height 5' 7" (1.702 m), weight 75.9 kg, SpO2 95 %.       No intake or output data in the 24 hours ending 07/08/22 1455  Filed Weights   06/29/22 1454 06/30/22 0600 07/06/22 1424  Weight: 77.3 kg 75.9 kg 75.9 kg    Examination: Gen:      No acute distress HEENT:  EOMI, sclera anicteric Neck:     No masses; no thyromegaly Lungs:    Clear to auscultation bilaterally; normal respiratory effort CV:         Regular rate and rhythm; no murmurs Abd:      + bowel sounds; soft, non-tender; no palpable masses, no distension Ext:    No edema; adequate peripheral perfusion Skin:      Warm and dry; no rash Neuro: alert and oriented x 3 Psych: normal mood and  affect    Resolved Hospital Problem list     Assessment & Plan:  100 pack smoking history - Present on Admit  Lymphocytic exude Pleural effusion on the right: - Present on Admit - Status post thoracentesis on 7/22  and 7/25: Lymphoticytic exudate Abnormal CT chest with apparent right lower lobe mass, mediastinal adenopathy  S/p bronchoscopy with EBUS on 7/28 to evaluate the right lower lobe  lesion, mediastinal adenopathy. 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 We will make follow-up visit in PCCM clinic in the next 1 to 2 weeks to discuss final diagnosis Please make referral to oncology on discharge.  Daughter updated at bedside.   Best Practice (right click and "Reselect all SmartList Selections" daily)   Per primary team   SIGNATURE   Marshell Garfinkel MD [facility] See Amion for pager  If no response to pager , please call [phone] until 7pm After 7:00 pm call Elink  [phone] 07/08/2022, 2:59 PM

## 2022-07-09 ENCOUNTER — Encounter (HOSPITAL_COMMUNITY): Payer: Self-pay | Admitting: Emergency Medicine

## 2022-07-09 ENCOUNTER — Other Ambulatory Visit (HOSPITAL_COMMUNITY): Payer: Self-pay

## 2022-07-09 ENCOUNTER — Telehealth: Payer: Self-pay | Admitting: Pulmonary Disease

## 2022-07-09 ENCOUNTER — Telehealth: Payer: Self-pay | Admitting: Internal Medicine

## 2022-07-09 DIAGNOSIS — R918 Other nonspecific abnormal finding of lung field: Secondary | ICD-10-CM | POA: Diagnosis not present

## 2022-07-09 LAB — GLUCOSE, CAPILLARY
Glucose-Capillary: 118 mg/dL — ABNORMAL HIGH (ref 70–99)
Glucose-Capillary: 133 mg/dL — ABNORMAL HIGH (ref 70–99)

## 2022-07-09 MED ORDER — ALBUTEROL SULFATE (2.5 MG/3ML) 0.083% IN NEBU: 2.5000 mg | INHALATION_SOLUTION | RESPIRATORY_TRACT | 1 refills | Status: AC | PRN

## 2022-07-09 MED ORDER — FUROSEMIDE 40 MG PO TABS: 40.0000 mg | ORAL_TABLET | Freq: Every day | ORAL | 2 refills | Status: DC | PRN

## 2022-07-09 MED ORDER — POTASSIUM CHLORIDE CRYS ER 10 MEQ PO TBCR: 10.0000 meq | EXTENDED_RELEASE_TABLET | Freq: Every day | ORAL | 3 refills | Status: DC | PRN

## 2022-07-09 MED ORDER — PREDNISONE 20 MG PO TABS
40.0000 mg | ORAL_TABLET | Freq: Every day | ORAL | 0 refills | Status: DC
  Filled 2022-07-09: qty 6, 3d supply, fill #0

## 2022-07-09 MED ORDER — APIXABAN 5 MG PO TABS
5.0000 mg | ORAL_TABLET | Freq: Two times a day (BID) | ORAL | 0 refills | Status: AC
  Filled 2022-07-09: qty 60, 30d supply, fill #0

## 2022-07-09 NOTE — TOC Transition Note (Addendum)
Transition of Care [facility]) - CM/SW Discharge Note   Patient Details  Name: KERRILYN AZBILL MRN: [id number] Date of Birth: [birth year]  Transition of Care [facility]) CM/SW Contact:  Leeroy Cha, RN Phone Number: 07/09/2022, 9:58 AM   Clinical Narrative:    Patient dcd to return home will have pt and ot through [facility].  Nebulizer ordered through [facility].  To be delivered to the house.   Final next level of care: [place] Barriers to Discharge: Barriers Resolved   Patient Goals and CMS Choice Patient states their goals for this hospitalization and ongoing recovery are:: to go home CMS Medicare.gov Compare Post Acute Care list provided to:: Patient Choice offered to / list presented to : Patient  Discharge Placement                       Discharge Plan and Services   Discharge Planning Services: CM Consult Post Acute Care Choice: Home Health                    HH Arranged: PT, OT [facility] Agency: [place] Date Pablo Pena: 07/06/22 Time HH Agency Contacted: 1500 Representative spoke with at [place]: amy  Social Determinants of Health (Fellows) Interventions     Readmission Risk Interventions     No data to display

## 2022-07-09 NOTE — Progress Notes (Signed)
Patient discharged home as ordered. D/C instructions explained to pt and daughter- both aware of new medications and upcoming appointments/follow-ups. New prescriptions delivered to pt's room prior to D/C. All belongings sent with pt. Midline removed prior to D/C. Pt assisted to exit via W/C with assist from nursing staff.

## 2022-07-09 NOTE — Plan of Care (Signed)
  Problem: Education: Goal: Knowledge of General Education information will improve Description: Including pain rating scale, medication(s)/side effects and non-pharmacologic comfort measures Outcome: Progressing   Problem: Clinical Measurements: Goal: Ability to maintain clinical measurements within normal limits will improve Outcome: Progressing Goal: Will remain free from infection Outcome: Progressing Goal: Diagnostic test results will improve Outcome: Progressing Goal: Respiratory complications will improve Outcome: Progressing Goal: Cardiovascular complication will be avoided Outcome: Progressing   Problem: Activity: Goal: Risk for activity intolerance will decrease Outcome: Progressing   Problem: Coping: Goal: Level of anxiety will decrease Outcome: Progressing   Problem: Elimination: Goal: Will not experience complications related to bowel motility Outcome: Progressing Goal: Will not experience complications related to urinary retention Outcome: Progressing   Problem: Safety: Goal: Ability to remain free from injury will improve Outcome: Progressing

## 2022-07-09 NOTE — Discharge Summary (Signed)
Physician Discharge Summary  Melody Casey BOF:[id number] DOB: 07-21-1946 DOA: 06/29/2022  PCP: Dettinger, [name], MD  Admit date: 06/29/2022 Discharge date: 07/09/2022  Admitted From: home Discharge disposition: home with family   Recommendations for Outpatient Follow-Up:   Home health Consider palliative care consult referral outpt Changed to eliquis BMP 1 week Re: K and Cr   Discharge Diagnosis:   Principal Problem:   Right lower lobe lung mass Active Problems:   Dyslipidemia, goal LDL below 70   Essential hypertension   Type 2 diabetes mellitus with complication, without long-term current use of insulin (HCC)   CAD S/P percutaneous coronary angioplasty   History of scleroderma   Persistent atrial fibrillation (HCC)   Rheumatoid arthritis ([place])   Colostomy present (HCC)   S/P total knee arthroplasty, left   Pleural effusion   Mediastinal adenopathy    Discharge Condition: Improved.  Diet recommendation: Low sodium, heart healthy  Wound care: None.  Code status: Full.   History of Present Illness:   Melody Casey  is a 76 y.o. female, with past medical history of A-fib, on Tikosyn and warfarin, hypertension, scleroderma, CAD status post PCI.  Diverticulitis/perforated sigmoid colon with colostomy, 2 diabetes mellitus, history of breast cancer. -ED secondary to shortness of breath, report it has been progressing over few weeks, but much worsened over last 5 days, reports she was recently diagnosed with a right pleural effusion by cardiology, she had referral done to pulmonary, but her appointment is not till August 8, patient report worsening dyspnea, like she is suffocating when she is laying down, she has been taking her Lasix without much improvement of her symptoms, she denies any chest pain, hemoptysis, worsening lower extremity edema, fever, chills or coughing, patient reports she was diagnosed with pneumonia 2 months ago for which it was treated with  oral antibiotics, she did not require hospitalization for that. -In ED creatinine within normal range at 0.64, BNP mildly elevated at 104, had no leukocytosis, she was afebrile, her INR was therapeutic at 2.7, chest x-ray showing progression of right pleural effusion, [facility] hospitalist consulted to admit.   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  -PCCM consult appreciated  CT scan shows lung mass with adrenal masses-- s/p EBUS -- preliminary appears to be small cell  -MRI brain negative -have stopped her IV abx due to rash-- benadryl improving-- trial of steroids for improvement -pulmonary toilet  -repeat chest  x ray with some pleural effusion but not as much as prior Does not qualify for home O2   Chronic A-fib with RVR. -Heart rate controlled, continue with metoprolol, Tikosyn. -warfarin held for EBUS--  change to eliquis after discussion with family   Hypertension/CAD/hyperlipidemia: Blood pressure controlled.   History of scleroderma -Does not appear to be taking her hydroxychloroquine anymore as she has not been to her physician for refill.   Hyperkalemia -stopped Po replacement -BMP in 1 week    Medical Consultants:   IR PCCM   Discharge Exam:   Vitals:   07/08/22 2036 07/09/22 0432  BP: 138/63 (!) 154/61  Pulse: 73 65  Resp: 19 17  Temp: 97.9 F (36.6 C) 97.7 F (36.5 C)  SpO2: 93% 92%   Vitals:   07/08/22 0548 07/08/22 1428 07/08/22 2036 07/09/22 0432  BP: 137/65 132/65 138/63 (!) 154/61  Pulse: 67 69 73  65  Resp:   19 17  Temp: 97.8 F (36.6 C)  97.9 F (36.6 C) 97.7 F (36.5 C)  TempSrc: Oral  Oral Oral  SpO2: 93% 95% 93% 92%  Weight:      Height:        General exam: Appears calm and comfortable.     The results of significant diagnostics from this hospitalization (including imaging, microbiology,  ancillary and laboratory) are listed below for reference.     Procedures and Diagnostic Studies:   US THORACENTESIS ASP PLEURAL SPACE W/IMG GUIDE  Result Date: 06/30/2022 INDICATION: Pleural effusion, history of breast cancer, shortness of breath EXAM: ULTRASOUND GUIDED RIGHT THORACENTESIS MEDICATIONS: None. COMPLICATIONS: None immediate. PROCEDURE: An ultrasound guided thoracentesis was thoroughly discussed with the patient and questions answered. The benefits, risks, alternatives and complications were also discussed. The patient understands and wishes to proceed with the procedure. Written consent was obtained. Ultrasound was performed to localize and mark an adequate pocket of fluid in the right chest. The area was then prepped and draped in the normal sterile fashion. 1% Lidocaine was used for local anesthesia. Under ultrasound guidance a 6 Fr Safe-T-Centesis catheter was introduced. Thoracentesis was performed. The catheter was removed and a dressing applied. FINDINGS: A total of approximately 1.3L of red pleural fluid was removed. Samples were sent to the laboratory as requested by the clinical team. IMPRESSION: Successful ultrasound guided right thoracentesis yielding 1.3L of pleural fluid. Performed and dictated by Pasty Spillers, PA-C Electronically Signed   By: Markus Daft M.D.   On: 06/30/2022 21:31   DG CHEST PORT 1 VIEW  Result Date: 06/30/2022 CLINICAL DATA:  Post right-sided thoracentesis. EXAM: PORTABLE CHEST 1 VIEW COMPARISON:  06/29/2022 FINDINGS: Examination demonstrates a moderate to large right pleural effusion with mild interval improvement post thoracentesis. No right-sided pneumothorax. Left lung is clear. Mild stable cardiomegaly. Remainder of the exam is unchanged. IMPRESSION: 1. Moderate to large right pleural effusion with mild interval improvement post thoracentesis. No pneumothorax. 2. Mild stable cardiomegaly. Electronically Signed   By: Marin Olp M.D.   On: 06/30/2022  13:15   DG Chest 2 View  Result Date: 06/29/2022 CLINICAL DATA:  Dyspnea, pleural effusion EXAM: CHEST - 2 VIEW COMPARISON:  Radiographs 06/25/2022 FINDINGS: Since 06/25/2022, slightly increased moderate-large right pleural effusion and associated atelectasis. Remainder unchanged. Left basilar atelectasis. Chronic interstitial coarsening. Aortic calcification. Unchanged cardiomediastinal silhouette. No acute osseous abnormality. IMPRESSION: Increased moderate-large right pleural effusion. Electronically Signed   By: Placido Sou M.D.   On: 06/29/2022 15:36     Labs:   Basic Metabolic Panel: Recent Labs  Lab 07/05/22 0803 07/06/22 0211 07/07/22 0433 07/08/22 0406  NA 135 134* 132* 133*  K 4.8 4.7 6.0* 5.5*  CL 110 107 107 108  CO2 20* 20* 19* 19*  GLUCOSE 126* 113* 166* 159*  BUN 34* 41* 54* 70*  CREATININE 0.86 0.92 1.10* 1.27*  CALCIUM 9.6 9.7 9.4 9.8   GFR Estimated Creatinine Clearance: 38.8 mL/min (A) (by C-G formula based on SCr of 1.27 mg/dL (H)). Liver Function Tests: Recent Labs  Lab 07/05/22 0803 07/06/22 0211  AST 13* 12*  ALT 10 10  ALKPHOS 57 58  BILITOT 0.7 0.9  PROT 5.7* 5.6*  ALBUMIN 2.4* 2.3*   No results for input(s): "LIPASE", "AMYLASE" in the last 168 hours. No results for input(s): "AMMONIA" in the last 168 hours. Coagulation profile Recent Labs  Lab 07/03/22 0601 07/04/22 0810 07/05/22 0427 07/06/22 0211 07/07/22 0433  INR  2.6* 2.8* 2.7* 1.3* 1.2    CBC: Recent Labs  Lab 07/03/22 0601 07/04/22 0810 07/05/22 0427 07/06/22 0211 07/07/22 0433  WBC 5.9 6.7 6.9 7.3 5.7  HGB 12.5 12.0 11.9* 11.2* 10.8*  HCT 39.7 37.0 37.9 34.6* 34.3*  MCV 89.6 88.1 89.6 88.3 89.1  PLT 212 216 220 219 208   Cardiac Enzymes: No results for input(s): "CKTOTAL", "CKMB", "CKMBINDEX", "TROPONINI" in the last 168 hours. BNP: Invalid input(s): "POCBNP" CBG: Recent Labs  Lab 07/08/22 0717 07/08/22 1132 07/08/22 1621 07/08/22 2033 07/09/22 0809   GLUCAP 129* 108* 186* 281* 118*   D-Dimer No results for input(s): "DDIMER" in the last 72 hours. Hgb A1c No results for input(s): "HGBA1C" in the last 72 hours. Lipid Profile No results for input(s): "CHOL", "HDL", "LDLCALC", "TRIG", "CHOLHDL", "LDLDIRECT" in the last 72 hours. Thyroid function studies No results for input(s): "TSH", "T4TOTAL", "T3FREE", "THYROIDAB" in the last 72 hours.  Invalid input(s): "FREET3" Anemia work up No results for input(s): "VITAMINB12", "FOLATE", "FERRITIN", "TIBC", "IRON", "RETICCTPCT" in the last 72 hours. Microbiology Recent Results (from the past 240 hour(s))  Body fluid culture w Gram Stain     Status: None   Collection Time: 06/30/22  2:00 PM   Specimen: PATH Cytology Pleural fluid  Result Value Ref Range Status   Specimen Description   Final    PLEURAL Performed at [facility], [place] [address], [place], [place] [address]    Special Requests   Final    PLEURAL Performed at [facility], [place] [address] [place], [place] [address]    Gram Stain   Final    FEW WBC PRESENT, PREDOMINANTLY MONONUCLEAR NO ORGANISMS SEEN    Culture   Final    NO GROWTH 3 DAYS Performed at [facility] [address] [place], [place] [address]    Report Status 07/04/2022 FINAL  Final  Body fluid culture w Gram Stain     Status: None   Collection Time: 07/03/22  4:09 PM   Specimen: PATH Cytology Pleural fluid  Result Value Ref Range Status   Specimen Description   Final    PLEURAL Performed at [facility] [address] [place], [place] [address]    Special Requests   Final    NONE Performed at [facility], [place] [address], [place], [place] [address]    Gram Stain NO WBC SEEN NO ORGANISMS SEEN   Final   Culture   Final    NO GROWTH 3 DAYS Performed at [facility], [place] [address] [place], [place] [address]    Report Status 07/07/2022 FINAL  Final      Discharge Instructions:   Discharge Instructions     Ambulatory referral to Hematology / Oncology   Complete by: As directed    Dr. Mckinley Jewel   Diet - low sodium heart healthy   Complete by: As directed    Increase activity slowly   Complete by: As directed    No wound care   Complete by: As directed       Allergies as of 07/09/2022       Reactions   Miralax [polyethylene Glycol] Swelling, Rash   Took [facility] brand developed rash. Patient states she tolerated name brand MiraLax in the past. 09/01/15. Patient states she had diffuse swelling including swelling of her lips.    Vancomycin Rash, Shortness Of Breath   Acetaminophen Hives   Banana Other (See  Comments)   Upset stomach   Oxycodone-acetaminophen Itching   Sulfa Antibiotics Rash   All over rash   Sulfacetamide Sodium Rash   All over rash   Sulfasalazine Rash   All over rash   Cefepime    rash   Latex Rash   Oxycodone-acetaminophen Rash   Sulfacetamide Sodium Rash   Sulfacetamide Sodium-sulfur Rash   Tape Rash        Medication List     STOP taking these medications    amLODipine 5 MG tablet Commonly known as: NORVASC   hydroxychloroquine 200 MG tablet Commonly known as: PLAQUENIL   warfarin 5 MG tablet Commonly known as: COUMADIN       TAKE these medications    ALPRAZolam 0.25 MG tablet Commonly known as: XANAX Take 1 tablet (0.25 mg total) by mouth 3 (three) times daily as needed for anxiety. Please put prescription on file and cancel any old prescriptions.   apixaban 5 MG Tabs tablet Commonly known as: ELIQUIS Take 1 tablet (5 mg total) by mouth 2 (two) times daily.   atorvastatin 40 MG tablet Commonly known as: LIPITOR TAKE 1 AND 1/2 TABLETS BY MOUTH EVERY DAY   dofetilide 250 MCG capsule Commonly known as: TIKOSYN Take 1 capsule (250 mcg total) by mouth 2 (two) times daily.   furosemide 40 MG tablet Commonly known as: LASIX Take 1 tablet (40 mg total) by mouth daily as needed  for edema. What changed:  when to take this reasons to take this   lisinopril 20 MG tablet Commonly known as: ZESTRIL Take 1 tablet (20 mg total) by mouth daily.   magnesium oxide 400 MG tablet Commonly known as: MAG-OX Take 400 mg by mouth 2 (two) times daily.   metoprolol succinate 25 MG 24 hr tablet Commonly known as: TOPROL-XL Take one tablet daily   potassium chloride 10 MEQ tablet Commonly known as: KLOR-CON M Take 1 tablet (10 mEq total) by mouth daily as needed (if taking lasix). Start taking on: July 12, 2022 What changed:  when to take this reasons to take this These instructions start on July 12, 2022. If you are unsure what to do until then, ask your doctor or other care provider.   predniSONE 20 MG tablet Commonly known as: DELTASONE Take 2 tablets (40 mg total) by mouth daily with breakfast. Start taking on: July 10, 2022        Follow-up Information     Dettinger, [name], MD Follow up.   Specialties: Family Medicine, Cardiology Why: bmp with in 1 week Contact information: [place] [id number] [phone]                  Time coordinating discharge: 45 min  Signed:  Geradine Girt DO  [facility] 07/09/2022, 9:56 AM

## 2022-07-09 NOTE — Telephone Encounter (Signed)
Scheduled appt per 7/28 referral. Pt is aware of appt date and time. Pt is aware to arrive 15 mins prior to appt time and to bring and updated insurance card. Pt is aware of appt location.

## 2022-07-09 NOTE — Telephone Encounter (Signed)
Please arrange for clinic follow up in 1-2 weeks with Dr. Lamonte Sakai or NP to discuss final cytology from [place].        Noe Gens, MSN, APRN, NP-C, AGACNP-BC [facility] 07/09/2022, 10:51 AM   Please see [email or web address] for pager details.   From 7A-7P if no response, please call [phone] After hours, please call ELink [phone]

## 2022-07-09 NOTE — Care Management Important Message (Signed)
Important Message  Patient Details IM Letter given to the Patient. Name: Melody Casey MRN: [id number] Date of Birth: September 16, 1946   Medicare Important Message Given:  Yes     Kerin Salen 07/09/2022, 10:06 AM

## 2022-07-09 NOTE — Discharge Instructions (Signed)
Referral made to hematology oncology Biopsy results pending

## 2022-07-09 NOTE — Progress Notes (Addendum)
Physical Therapy Treatment Patient Details Name: Melody Casey MRN: [id number] DOB: 1946-10-11 Today's Date: 07/09/2022   History of Present Illness 76 y.o. female admitted 06/29/22 with pleural effusion. patient had thoracentesis on 7/22 with 1.3L collected.  Pt found to have lung mass with ongoing workup. Past medical history: of A-fib, on Tikosyn and warfarin, hypertension, scleroderma, CAD status post PCI, Diverticulitis/perforated sigmoid colon with colostomy, diabetes mellitus, breast cancer, AAA, L TKA 01/01/22    PT Comments    Pt reports feeling overwhelmed mentally with all these rapid changes with her health. Husband recently passed away.  She lives alone, family here from [place] currently. Pt expresses she does not know if she wants to proceed with tx. Pt would certainly benefit from Palliative consult as OP.  Recommendations for follow up therapy are one component of a multi-disciplinary discharge planning process, led by the attending physician.  Recommendations may be updated based on patient status, additional functional criteria and insurance authorization.  Follow Up Recommendations  Home health PT (family requesting HHPT)     Assistance Recommended at Discharge PRN  Patient can return home with the following Help with stairs or ramp for entrance   Equipment Recommendations  None recommended by PT    Recommendations for Other Services       Precautions / Restrictions Precautions Precautions: Fall Precaution Comments: colostomy(2019) Restrictions Weight Bearing Restrictions: No (Simultaneous filing. User may not have seen previous data.)     Mobility  Bed Mobility Overal bed mobility: Modified Independent Bed Mobility: Supine to Sit     Supine to sit: Modified independent (Device/Increase time)     General bed mobility comments: HOB elevated, incr time    Transfers Overall transfer level: Needs assistance Equipment used: Rolling walker (2 wheels) Transfers:  Sit to/from Stand Sit to Stand: Min assist, Min guard           General transfer comment: x2, min assist from toilet/lower ht, min/guard from elevated bed    Ambulation/Gait Ambulation/Gait assistance: Min guard, Supervision Gait Distance (Feet): 12 Feet (x2) Assistive device: Rolling walker (2 wheels) Gait Pattern/deviations: Step-through pattern, Trunk flexed Gait velocity: decreased     General Gait Details: Cues for posture (could correct) and staying close to RW. pt fatigues rapidly today, wheezing and dyspneic   Stairs             Wheelchair Mobility    Modified Rankin (Stroke Patients Only)       Balance     Sitting balance-Leahy Scale: Good (able to wt shift to don clothing - lower body)     Standing balance support: No upper extremity supported Standing balance-Leahy Scale: Fair                              Cognition Arousal/Alertness: Awake/alert Behavior During Therapy: WFL for tasks assessed/performed Overall Cognitive Status: Within Functional Limits for tasks assessed                                          Exercises      General Comments        Pertinent Vitals/Pain Pain Assessment Pain Assessment: No/denies pain    Home Living                          Prior  Function            PT Goals (current goals can now be found in the care plan section) Acute Rehab PT Goals PT Goal Formulation: With patient Time For Goal Achievement: 07/14/22 Potential to Achieve Goals: Good Progress towards PT goals: Progressing toward goals    Frequency    Min 3X/week      PT Plan Discharge plan needs to be updated    Co-evaluation              AM-PAC PT "6 Clicks" Mobility   Outcome Measure  Help needed turning from your back to your side while in a flat bed without using bedrails?: None Help needed moving from lying on your back to sitting on the side of a flat bed without using  bedrails?: A Little Help needed moving to and from a bed to a chair (including a wheelchair)?: A Little Help needed standing up from a chair using your arms (e.g., wheelchair or bedside chair)?: A Little Help needed to walk in hospital room?: A Little Help needed climbing 3-5 steps with a railing? : A Little 6 Click Score: 19    End of Session Equipment Utilized During Treatment: Gait belt Activity Tolerance: Patient limited by fatigue Patient left: in bed;with call bell/phone within reach;with bed alarm set Nurse Communication: Mobility status PT Visit Diagnosis: Difficulty in walking, not elsewhere classified (R26.2)     Time: 6195-0932 PT Time Calculation (min) (ACUTE ONLY): 16 min  Charges:  $Therapeutic Activity: 8-22 mins                     Baxter Flattery, PT  Acute Rehab Dept [facility]) [phone]  [facility] Weekend Pager (Saturday/Sunday only)  [phone]  07/09/2022    [facility] 07/09/2022, 11:27 AM

## 2022-07-09 NOTE — Telephone Encounter (Signed)
Pt is already scheduled to see Dr. Loanne Drilling on 8/8. RB messaged front desk pool this morning and said this was okay at pt did not need to see multiple providers for this. Nothing further needed.

## 2022-07-10 ENCOUNTER — Other Ambulatory Visit: Payer: Self-pay | Admitting: *Deleted

## 2022-07-10 ENCOUNTER — Ambulatory Visit (HOSPITAL_BASED_OUTPATIENT_CLINIC_OR_DEPARTMENT_OTHER): Payer: Medicare Other | Admitting: Family

## 2022-07-10 NOTE — Patient Outreach (Signed)
  Care Coordination [facility] Note Transition Care Management Unsuccessful Follow-up Telephone Call  Date of discharge and from where:  07/09/22 [facility]  Attempts:  1st Attempt  Reason for unsuccessful TCM follow-up call:  Left voice message.  Emelia Loron RN, BSN [place] [phone] [email or web address]

## 2022-07-11 ENCOUNTER — Telehealth: Payer: Self-pay | Admitting: Emergency Medicine

## 2022-07-11 ENCOUNTER — Other Ambulatory Visit: Payer: Self-pay | Admitting: *Deleted

## 2022-07-11 ENCOUNTER — Other Ambulatory Visit: Payer: Self-pay

## 2022-07-11 DIAGNOSIS — R918 Other nonspecific abnormal finding of lung field: Secondary | ICD-10-CM

## 2022-07-11 LAB — CYTOLOGY - NON PAP

## 2022-07-11 NOTE — Patient Outreach (Signed)
  Care Coordination [facility] Note Transition Care Management Follow-up Telephone Call Date of discharge and from where: 07/09/22 [facility] How have you been since you were released from the hospital? Patient states she continues to be short of breath. Daughter states her o2 saturation is 6 Any questions or concerns? Yes, daughter states the nebulizer machine has not bee delivered by [employer]. Nurse called [employer] to follow-up and was told that they were unable to give her information due to [place]. Nurse called daughter [name] back and gave her the phone number to [employer] to follow-up.  Items Reviewed: Did the pt receive and understand the discharge instructions provided? Yes  Medications obtained and verified? Yes  Other? No  Any new allergies since your discharge? No  Dietary orders reviewed? Yes Do you have support at home? Yes   Home Care and Equipment/Supplies: Were home health services ordered? yes If so, what is the name of the agency? [facility]  Has the agency set up a time to come to the patient's home? yes Were any new equipment or medical supplies ordered?  Yes: nebulizer What is the name of the medical supply agency? [facility] Were you able to get the supplies/equipment? no Do you have any questions related to the use of the equipment or supplies? No  Functional Questionnaire: (I = Independent and D = Dependent) ADLs: D  Bathing/Dressing- D  Meal Prep- D  Eating- I  Maintaining continence- I  Transferring/Ambulation- I  Managing Meds- D  Follow up appointments reviewed:  PCP Hospital f/u appt confirmed? No   Specialist Hospital f/u appt confirmed? Yes  Scheduled to see Dr. Julien Nordmann on 07/13/22 @ 0900. Are transportation arrangements needed? No  If their condition worsens, is the pt aware to call PCP or go to the Emergency Dept.? Yes Was the patient provided with contact information for the PCP's office or ED? Yes Was to pt encouraged to call back with questions or  concerns? Yes  SDOH assessments and interventions completed:   Yes  Care Coordination Interventions Activated:  No   Care Coordination Interventions:   no care coordination interventions needed     Encounter Outcome:  Pt. Visit Completed    Emelia Loron RN, BSN [place] [phone] [email or web address]

## 2022-07-11 NOTE — Telephone Encounter (Signed)
Called pt to discuss FOB results. No answer, left VM and will try her back. Path shows small cell lung CA

## 2022-07-13 ENCOUNTER — Telehealth: Payer: Self-pay | Admitting: Radiation Oncology

## 2022-07-13 ENCOUNTER — Other Ambulatory Visit: Payer: Self-pay

## 2022-07-13 ENCOUNTER — Inpatient Hospital Stay: Payer: Medicare Other

## 2022-07-13 ENCOUNTER — Inpatient Hospital Stay: Payer: Medicare Other | Attending: Internal Medicine | Admitting: Internal Medicine

## 2022-07-13 ENCOUNTER — Encounter: Payer: Self-pay | Admitting: Licensed Clinical Social Worker

## 2022-07-13 ENCOUNTER — Encounter: Payer: Self-pay | Admitting: Internal Medicine

## 2022-07-13 VITALS — BP 126/86 | HR 83 | Temp 97.0°F | Resp 18 | Wt 163.8 lb

## 2022-07-13 DIAGNOSIS — C3432 Malignant neoplasm of lower lobe, left bronchus or lung: Secondary | ICD-10-CM | POA: Insufficient documentation

## 2022-07-13 DIAGNOSIS — Z8673 Personal history of transient ischemic attack (TIA), and cerebral infarction without residual deficits: Secondary | ICD-10-CM | POA: Diagnosis not present

## 2022-07-13 DIAGNOSIS — R0602 Shortness of breath: Secondary | ICD-10-CM | POA: Diagnosis not present

## 2022-07-13 DIAGNOSIS — C3490 Malignant neoplasm of unspecified part of unspecified bronchus or lung: Secondary | ICD-10-CM

## 2022-07-13 DIAGNOSIS — I1310 Hypertensive heart and chronic kidney disease without heart failure, with stage 1 through stage 4 chronic kidney disease, or unspecified chronic kidney disease: Secondary | ICD-10-CM | POA: Diagnosis present

## 2022-07-13 DIAGNOSIS — C7971 Secondary malignant neoplasm of right adrenal gland: Secondary | ICD-10-CM | POA: Diagnosis present

## 2022-07-13 DIAGNOSIS — I4819 Other persistent atrial fibrillation: Secondary | ICD-10-CM | POA: Diagnosis not present

## 2022-07-13 DIAGNOSIS — F419 Anxiety disorder, unspecified: Secondary | ICD-10-CM | POA: Diagnosis not present

## 2022-07-13 DIAGNOSIS — Z955 Presence of coronary angioplasty implant and graft: Secondary | ICD-10-CM | POA: Diagnosis not present

## 2022-07-13 DIAGNOSIS — R918 Other nonspecific abnormal finding of lung field: Secondary | ICD-10-CM

## 2022-07-13 DIAGNOSIS — I252 Old myocardial infarction: Secondary | ICD-10-CM | POA: Diagnosis not present

## 2022-07-13 DIAGNOSIS — Z91048 Other nonmedicinal substance allergy status: Secondary | ICD-10-CM | POA: Diagnosis not present

## 2022-07-13 DIAGNOSIS — J910 Malignant pleural effusion: Secondary | ICD-10-CM | POA: Diagnosis not present

## 2022-07-13 DIAGNOSIS — C50411 Malignant neoplasm of upper-outer quadrant of right female breast: Secondary | ICD-10-CM | POA: Diagnosis not present

## 2022-07-13 DIAGNOSIS — Z91018 Allergy to other foods: Secondary | ICD-10-CM | POA: Diagnosis not present

## 2022-07-13 DIAGNOSIS — E785 Hyperlipidemia, unspecified: Secondary | ICD-10-CM | POA: Diagnosis not present

## 2022-07-13 DIAGNOSIS — C3481 Malignant neoplasm of overlapping sites of right bronchus and lung: Secondary | ICD-10-CM | POA: Diagnosis not present

## 2022-07-13 DIAGNOSIS — F32A Depression, unspecified: Secondary | ICD-10-CM | POA: Diagnosis not present

## 2022-07-13 DIAGNOSIS — Z170 Estrogen receptor positive status [ER+]: Secondary | ICD-10-CM | POA: Diagnosis not present

## 2022-07-13 DIAGNOSIS — Z7189 Other specified counseling: Secondary | ICD-10-CM | POA: Diagnosis not present

## 2022-07-13 DIAGNOSIS — Z515 Encounter for palliative care: Secondary | ICD-10-CM | POA: Diagnosis not present

## 2022-07-13 DIAGNOSIS — I2510 Atherosclerotic heart disease of native coronary artery without angina pectoris: Secondary | ICD-10-CM | POA: Diagnosis not present

## 2022-07-13 DIAGNOSIS — J9601 Acute respiratory failure with hypoxia: Secondary | ICD-10-CM | POA: Diagnosis not present

## 2022-07-13 DIAGNOSIS — N1831 Chronic kidney disease, stage 3a: Secondary | ICD-10-CM | POA: Diagnosis present

## 2022-07-13 DIAGNOSIS — E1122 Type 2 diabetes mellitus with diabetic chronic kidney disease: Secondary | ICD-10-CM | POA: Diagnosis present

## 2022-07-13 DIAGNOSIS — Z885 Allergy status to narcotic agent status: Secondary | ICD-10-CM | POA: Diagnosis not present

## 2022-07-13 DIAGNOSIS — J90 Pleural effusion, not elsewhere classified: Secondary | ICD-10-CM | POA: Diagnosis not present

## 2022-07-13 DIAGNOSIS — Z66 Do not resuscitate: Secondary | ICD-10-CM | POA: Diagnosis present

## 2022-07-13 DIAGNOSIS — Z888 Allergy status to other drugs, medicaments and biological substances status: Secondary | ICD-10-CM | POA: Diagnosis not present

## 2022-07-13 DIAGNOSIS — Z87891 Personal history of nicotine dependence: Secondary | ICD-10-CM | POA: Diagnosis not present

## 2022-07-13 DIAGNOSIS — E118 Type 2 diabetes mellitus with unspecified complications: Secondary | ICD-10-CM | POA: Diagnosis not present

## 2022-07-13 DIAGNOSIS — Z5111 Encounter for antineoplastic chemotherapy: Secondary | ICD-10-CM

## 2022-07-13 DIAGNOSIS — R0600 Dyspnea, unspecified: Secondary | ICD-10-CM | POA: Diagnosis not present

## 2022-07-13 DIAGNOSIS — I714 Abdominal aortic aneurysm, without rupture, unspecified: Secondary | ICD-10-CM | POA: Diagnosis present

## 2022-07-13 DIAGNOSIS — I10 Essential (primary) hypertension: Secondary | ICD-10-CM | POA: Diagnosis not present

## 2022-07-13 DIAGNOSIS — R846 Abnormal cytological findings in specimens from respiratory organs and thorax: Secondary | ICD-10-CM | POA: Diagnosis not present

## 2022-07-13 DIAGNOSIS — Z9861 Coronary angioplasty status: Secondary | ICD-10-CM | POA: Diagnosis not present

## 2022-07-13 DIAGNOSIS — Z882 Allergy status to sulfonamides status: Secondary | ICD-10-CM | POA: Diagnosis not present

## 2022-07-13 DIAGNOSIS — R5381 Other malaise: Secondary | ICD-10-CM | POA: Diagnosis not present

## 2022-07-13 DIAGNOSIS — Z5112 Encounter for antineoplastic immunotherapy: Secondary | ICD-10-CM | POA: Diagnosis not present

## 2022-07-13 DIAGNOSIS — Z801 Family history of malignant neoplasm of trachea, bronchus and lung: Secondary | ICD-10-CM | POA: Diagnosis not present

## 2022-07-13 LAB — CBC WITH DIFFERENTIAL (CANCER CENTER ONLY)
Abs Immature Granulocytes: 0.05 10*3/uL (ref 0.00–0.07)
Basophils Absolute: 0 10*3/uL (ref 0.0–0.1)
Basophils Relative: 0 %
Eosinophils Absolute: 0 10*3/uL (ref 0.0–0.5)
Eosinophils Relative: 0 %
HCT: 37.2 % (ref 36.0–46.0)
Hemoglobin: 12.5 g/dL (ref 12.0–15.0)
Immature Granulocytes: 1 %
Lymphocytes Relative: 16 %
Lymphs Abs: 1.6 10*3/uL (ref 0.7–4.0)
MCH: 28.1 pg (ref 26.0–34.0)
MCHC: 33.6 g/dL (ref 30.0–36.0)
MCV: 83.6 fL (ref 80.0–100.0)
Monocytes Absolute: 0.9 10*3/uL (ref 0.1–1.0)
Monocytes Relative: 9 %
Neutro Abs: 7.2 10*3/uL (ref 1.7–7.7)
Neutrophils Relative %: 74 %
Platelet Count: 358 10*3/uL (ref 150–400)
RBC: 4.45 MIL/uL (ref 3.87–5.11)
RDW: 14.7 % (ref 11.5–15.5)
WBC Count: 9.7 10*3/uL (ref 4.0–10.5)
nRBC: 0 % (ref 0.0–0.2)

## 2022-07-13 LAB — CMP (CANCER CENTER ONLY)
ALT: 27 U/L (ref 0–44)
AST: 23 U/L (ref 15–41)
Albumin: 3.3 g/dL — ABNORMAL LOW (ref 3.5–5.0)
Alkaline Phosphatase: 92 U/L (ref 38–126)
Anion gap: 6 (ref 5–15)
BUN: 42 mg/dL — ABNORMAL HIGH (ref 8–23)
CO2: 22 mmol/L (ref 22–32)
Calcium: 9.7 mg/dL (ref 8.9–10.3)
Chloride: 101 mmol/L (ref 98–111)
Creatinine: 1 mg/dL (ref 0.44–1.00)
GFR, Estimated: 58 mL/min — ABNORMAL LOW (ref >60)
Glucose, Bld: 163 mg/dL — ABNORMAL HIGH (ref 70–99)
Potassium: 4.8 mmol/L (ref 3.5–5.1)
Sodium: 129 mmol/L — ABNORMAL LOW (ref 135–145)
Total Bilirubin: 0.4 mg/dL (ref 0.3–1.2)
Total Protein: 6.5 g/dL (ref 6.5–8.1)

## 2022-07-13 NOTE — Progress Notes (Signed)
[facility] CSW Progress Note  Clinical Education officer, museum  received referral from medical provider due  to newly diagnosed lung cancer.  CSW spoke briefly w/ both pt and daughter to introduce self and provide contact details.  Pt's son and daughter are with pt now; however, both reside in [place].  Pt lost her spouse 2 months ago and is still processing his passing and now facing a cancer diagnosis herself.  Both pt and family are overwhelmed from the information provided today.  Over the weekend the family will discuss the information they were given today and make some decisions about what treatment should look like for pt and if it will be locally or possibly in [place].  Pt's daughter to call CSW after the weekend to discuss available support based upon those decisions.  CSW to remain available to provide support at appropriate.      Henriette Combs, LCSW

## 2022-07-13 NOTE — Progress Notes (Signed)
[place] Telephone:(336) [phone]   [phone]  CONSULT NOTE  REFERRING PHYSICIAN: Dr. Baltazar Apo  REASON FOR CONSULTATION:  76 years old white female recently diagnosed with lung cancer  HPI Melody Casey is a 76 y.o. female with past medical history significant for multiple medical problems including history of atrial fibrillation, coronary artery disease, diabetes mellitus, hypertension, dyslipidemia, diverticulitis, history of right breast cancer stage IA (T1b, N0, MX) diagnosed in March 2015 status post lumpectomy with no adjuvant radiotherapy status post treatment with anastrozole completed in April 2021.  The patient also has a long history of smoking.  She was admitted to [facility] long hospital on 06/29/2022 complaining of shortness of breath that has been getting worse.  She was previously seen by her cardiologist and noticed to have right pleural effusion.  Chest x-ray on 06/29/2022 showed increased moderate-large right pleural effusion.  The patient underwent ultrasound-guided right thoracentesis with drainage of 1.3 L of pleural fluid.  The cytology was negative for malignant cells.  She had CT scan of the chest without contrast on 07/03/2022 and that showed suspected large right lower lobe mass poorly evaluated on the unenhanced head CT with associated right middle and lower lobe atelectasis/collapse and the appearance is suspicious for primary bronchogenic neoplasm.  There was bulky mediastinal adenopathy including 2.8 cm short axis right paratracheal node and 4.5 cm subcarinal nodal mass.  Repeat ultrasound guided right thoracentesis on 07/03/2022 drained 2.5 L of serosanguineous fluid.  Again the fluid was negative for malignant cells.  On 07/06/2022 the patient underwent video bronchoscopy with EBUS under the care of Dr. Lamonte Sakai.  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 The malignant cells are positive with  CD56, synaptophysin,  chromogranin and TTF-1.  The cells are negative with CD45 and Ki-67 shows a markedly increased proliferation rate.  The patient also had MRI of the brain on 07/05/2022 and it showed no metastatic disease or acute intracranial abnormalities identified. Dr. Lamonte Sakai kindly referred the patient to me today for evaluation and recommendation regarding treatment of her condition. When seen today the patient mentions that she is feeling lousy.  She continues to have significant fatigue and weakness as well as cough and shortness of breath but no chest pain or hemoptysis.  She lost around 20 pounds in the last few months.  She has no nausea, vomiting, diarrhea or constipation.  She denied having any headache or visual changes. Family history significant for father with lung cancer.  Mother had congestive heart failure and died in her 34s.  Brother had prostate cancer. The patient is a widow and she lost her husband 2 months ago.  She has 2 children who accompanied her to the visit today.  These are her son Jearld Fenton and her daughter Ander Purpura.  The patient used to work as a Char[employer] fundraiser.  She has a history for smoking more than 1 pack/day for around 45 years and quit in 2015.  She has no history of alcohol or drug abuse.  HPI  Past Medical History:  Diagnosis Date   AAA (abdominal aortic aneurysm) ([place])    Acute diverticulitis    Anxiety    Arthritis    Atrial fibrillation ([place])    a. Dx 03/2011 - on tikosyn/coumadin.   CAD (coronary artery disease)    a. NSTEMI 02/2011: occ mid Cx, DES to OM2, residual nonobst LAD dz.   Cancer [facility])    breast  Diabetes mellitus    Diverticulitis    Diverticulitis of colon     2008, 04/2011, 12/2014, 08/2015   Dysrhythmia    Foot deformity 1947   right; ??scleroderma   Hemangioma    liver   HLD (hyperlipidemia)    HTN (hypertension)    Osteoporosis    Tobacco abuse    stopped smoking 2012   Ureteral obstruction    History of gross  hematuria/right hydronephrosis 2/2 to uteropelvic junction obstruction, s/p cystoscopy in January 2007 with bilateral retrograde pyelography, right ureter arthroscopy, right ureteral stent placement, bladder biopsies, stent removal since then.   Wears dentures    top    Past Surgical History:  Procedure Laterality Date   BIOPSY N/A 11/01/2015   Procedure: BIOPSY;  Surgeon: Danie Binder, MD;  Location: [facility];  Service: Endoscopy;  Laterality: N/A;   BIOPSY  07/06/2022   Procedure: BIOPSY;  Surgeon: Collene Gobble, MD;  Location: [facility];  Service: Cardiopulmonary;;   BREAST LUMPECTOMY WITH NEEDLE LOCALIZATION AND AXILLARY SENTINEL LYMPH NODE BX Right 03/01/2014   Procedure: BREAST LUMPECTOMY WITH NEEDLE LOCALIZATION AND AXILLARY SENTINEL LYMPH NODE BX;  Surgeon: Edward Jolly, MD;  Location: [place];  Service: General;  Laterality: Right;   BREAST SURGERY     BRONCHIAL BRUSHINGS  07/06/2022   Procedure: BRONCHIAL BRUSHINGS;  Surgeon: Collene Gobble, MD;  [facility];  Service: Cardiopulmonary;;   CARDIAC CATHETERIZATION  2012   stent   COLON RESECTION N/A 07/25/2018   Procedure: COLOSTOMY;  Surgeon: Rolm Bookbinder, MD;  Location: [place];  Service: General;  Laterality: N/A;   COLONOSCOPY WITH PROPOFOL N/A 06/29/2014   Dr. Wynetta Emery: universal diverticulosis   CORONARY STENT PLACEMENT  2012   ENDOBRONCHIAL ULTRASOUND Bilateral 07/06/2022   Procedure: ENDOBRONCHIAL ULTRASOUND;  Surgeon: Collene Gobble, MD;  [facility];  Service: Cardiopulmonary;  Laterality: Bilateral;   ESOPHAGOGASTRODUODENOSCOPY (EGD) WITH PROPOFOL N/A 11/01/2015   Procedure: ESOPHAGOGASTRODUODENOSCOPY (EGD) WITH PROPOFOL;  Surgeon: Danie Binder, MD;  Location: [facility];  Service: Endoscopy;  Laterality: N/A;   FINE NEEDLE ASPIRATION  07/06/2022   Procedure: FINE NEEDLE ASPIRATION (FNA) LINEAR;  Surgeon: Collene Gobble, MD;  [facility];  Service:  Cardiopulmonary;;   KNEE ARTHROSCOPY     left   LAPAROTOMY N/A 07/25/2018   Procedure: EXPLORATORY LAPAROTOMY;  Surgeon: Rolm Bookbinder, MD;  Location: [place];  Service: General;  Laterality: N/A;   PARTIAL COLECTOMY N/A 07/25/2018   Procedure: COLECTOMY;  Surgeon: Rolm Bookbinder, MD;  Location: [place];  Service: General;  Laterality: N/A;   Right leg surgery  age 25   As a child for  ?scleroderma per patient   TONSILLECTOMY AND ADENOIDECTOMY     TOTAL KNEE ARTHROPLASTY Left 01/01/2022   Procedure: LEFT TOTAL KNEE ARTHROPLASTY;  Surgeon: Frederik Pear, MD;  Location: [facility];  Service: Orthopedics;  Laterality: Left;   TUBAL LIGATION     Ureteral surgery  2011   rt ureterostomy-stent   VIDEO BRONCHOSCOPY N/A 07/06/2022   Procedure: VIDEO BRONCHOSCOPY WITHOUT FLUORO;  Surgeon: Collene Gobble, MD;  [facility];  Service: Cardiopulmonary;  Laterality: N/A;    Family History  Problem Relation Age of Onset   Lung cancer Father 65       deceased   Cancer Father    Heart failure Mother 39       deceased   Heart disease Mother    Diabetes Brother  Hypertension Brother    Cancer Brother        prostate   Colon cancer Neg Hx    Liver disease Neg Hx     Social History Social History   Tobacco Use   Smoking status: Former    Packs/day: 2.00    Years: 50.00    Total pack years: 100.00    Types: Cigarettes    Quit date: 02/08/2011    Years since quitting: 11.4   Smokeless tobacco: Never  Vaping Use   Vaping Use: Never used  Substance Use Topics   Alcohol use: No    Alcohol/week: 0.0 standard drinks of alcohol   Drug use: No    Allergies  Allergen Reactions   Miralax [Polyethylene Glycol] Swelling and Rash    Took [facility] brand developed rash. Patient states she tolerated name brand MiraLax in the past.  09/01/15. Patient states she had diffuse swelling including swelling of her lips.    Vancomycin Rash and Shortness Of Breath   Acetaminophen Hives   Banana Other  (See Comments)    Upset stomach   Oxycodone-Acetaminophen Itching   Sulfa Antibiotics Rash    All over rash   Sulfacetamide Sodium Rash    All over rash   Sulfasalazine Rash    All over rash    Cefepime     rash   Latex Rash   Oxycodone-Acetaminophen Rash   Sulfacetamide Sodium Rash   Sulfacetamide Sodium-Sulfur Rash   Tape Rash    Current Outpatient Medications  Medication Sig Dispense Refill   albuterol (PROVENTIL) (2.5 MG/3ML) 0.083% nebulizer solution Take 3 mLs (2.5 mg total) by nebulization every 4 (four) hours as needed for wheezing or shortness of breath. 75 mL 1   ALPRAZolam (XANAX) 0.25 MG tablet Take 1 tablet (0.25 mg total) by mouth 3 (three) times daily as needed for anxiety. Please put prescription on file and cancel any old prescriptions. 30 tablet 2   apixaban (ELIQUIS) 5 MG TABS tablet Take 1 tablet by mouth 2 times daily. 60 tablet 0   atorvastatin (LIPITOR) 40 MG tablet TAKE 1 AND 1/2 TABLETS BY MOUTH EVERY DAY 135 tablet 3   dofetilide (TIKOSYN) 250 MCG capsule Take 1 capsule (250 mcg total) by mouth 2 (two) times daily. 180 capsule 3   furosemide (LASIX) 40 MG tablet Take 1 tablet (40 mg total) by mouth daily as needed for edema. 30 tablet 2   lisinopril (ZESTRIL) 20 MG tablet Take 1 tablet (20 mg total) by mouth daily. 90 tablet 3   magnesium oxide (MAG-OX) 400 MG tablet Take 400 mg by mouth 2 (two) times daily.     metoprolol succinate (TOPROL-XL) 25 MG 24 hr tablet Take one tablet daily 90 tablet 3   potassium chloride (KLOR-CON M) 10 MEQ tablet Take 1 tablet (10 mEq total) by mouth daily as needed (if taking lasix). 180 tablet 3   predniSONE (DELTASONE) 20 MG tablet Take 2 tablets by mouth daily with breakfast. 6 tablet 0   No current facility-administered medications for this visit.    Review of Systems  Constitutional: positive for anorexia, fatigue, and weight loss Eyes: negative Ears, nose, mouth, throat, and face: negative Respiratory: positive  for cough and dyspnea on exertion Cardiovascular: negative Gastrointestinal: negative Genitourinary:negative Integument/breast: negative Hematologic/lymphatic: negative Musculoskeletal:negative Neurological: negative Behavioral/Psych: negative Endocrine: negative Allergic/Immunologic: negative  Physical Exam  BPZ:WCHEN, healthy, no distress, malnourished, and mild distress SKIN: skin color, texture, turgor are normal, no rashes or significant lesions  HEAD: Normocephalic, No masses, lesions, tenderness or abnormalities EYES: normal, PERRLA, Conjunctiva are pink and non-injected EARS: External ears normal, Canals clear OROPHARYNX:no exudate and no erythema  NECK: supple, no adenopathy, no JVD LYMPH:  no palpable lymphadenopathy, no hepatosplenomegaly BREAST:not examined LUNGS: coarse sounds heard, decreased breath sounds HEART: regular rate & rhythm, no murmurs, and no gallops ABDOMEN:abdomen soft, non-tender, normal bowel sounds, and no masses or organomegaly BACK: Back symmetric, no curvature., No CVA tenderness EXTREMITIES:no joint deformities, effusion, or inflammation, no edema  NEURO: alert & oriented x 3 with fluent speech, no focal motor/sensory deficits  PERFORMANCE STATUS: ECOG 1-2  LABORATORY DATA: Lab Results  Component Value Date   WBC 5.7 07/07/2022   HGB 10.8 (L) 07/07/2022   HCT 34.3 (L) 07/07/2022   MCV 89.1 07/07/2022   PLT 208 07/07/2022      Chemistry      Component Value Date/Time   NA 133 (L) 07/08/2022 0406   NA 136 04/20/2022 1112   NA 134 (L) 06/06/2017 1259   K 5.5 (H) 07/08/2022 0406   K 4.5 06/06/2017 1259   CL 108 07/08/2022 0406   CO2 19 (L) 07/08/2022 0406   CO2 24 06/06/2017 1259   BUN 70 (H) 07/08/2022 0406   BUN 14 04/20/2022 1112   BUN 18.3 06/06/2017 1259   CREATININE 1.27 (H) 07/08/2022 0406   CREATININE 0.79 06/15/2019 0937   CREATININE 1.0 06/06/2017 1259      Component Value Date/Time   CALCIUM 9.8 07/08/2022 0406    CALCIUM 10.6 (H) 06/06/2017 1259   ALKPHOS 58 07/06/2022 0211   ALKPHOS 115 06/06/2017 1259   AST 12 (L) 07/06/2022 0211   AST 16 06/15/2019 0937   AST 17 06/06/2017 1259   ALT 10 07/06/2022 0211   ALT 14 06/15/2019 0937   ALT 14 06/06/2017 1259   BILITOT 0.9 07/06/2022 0211   BILITOT 0.3 04/20/2022 1112   BILITOT 0.3 06/15/2019 0937   BILITOT 0.52 06/06/2017 1259       RADIOGRAPHIC STUDIES: DG CHEST PORT 1 VIEW  Result Date: 07/08/2022 CLINICAL DATA:  76 year old female presents for a value to a shin of RIGHT lower lobe mass. EXAM: PORTABLE CHEST 1 VIEW COMPARISON:  July 05, 2022 in July 03, 2022 FINDINGS: Trachea midline. Cardiomediastinal contours and hilar structures are stable though RIGHT heart border and hilum is obscured at again without change due to masslike area in the RIGHT lower chest better visualized on previous CT likely associated with small RIGHT-sided pleural effusion. No pneumothorax. LEFT chest is clear. On limited assessment no acute skeletal findings. IMPRESSION: 1. Masslike area in the RIGHT lower chest likely associated with small RIGHT-sided pleural effusion. Findings remain highly suspicious for neoplasm and or better assessed on recent chest CT. 2. No change in the appearance of the chest over the short interval. Electronically Signed   By: Zetta Bills M.D.   On: 07/08/2022 08:29   DG CHEST PORT 1 VIEW  Result Date: 07/05/2022 CLINICAL DATA:  Right pleural effusion EXAM: PORTABLE CHEST 1 VIEW COMPARISON:  Chest x-ray dated June 03, 2022 FINDINGS: Visualized cardiac and mediastinal contours are unchanged. Right hemithorax volume loss, similar to prior. Increased opacification of the right hemithorax, likely due to increased pleural effusion or atelectasis. No evidence of pneumothorax. IMPRESSION: Increased opacification of the right hemithorax, likely due to increased pleural effusion or atelectasis. Electronically Signed   By: Yetta Glassman M.D.   On:  07/05/2022 08:14   MR BRAIN W  WO CONTRAST  Result Date: 07/05/2022 CLINICAL DATA:  76 year old female with history of breast cancer, possible right lung mass. Staging. EXAM: MRI HEAD WITHOUT AND WITH CONTRAST TECHNIQUE: Multiplanar, multiecho pulse sequences of the brain and surrounding structures were obtained without and with intravenous contrast. CONTRAST:  26m GADAVIST GADOBUTROL 1 MMOL/ML IV SOLN COMPARISON:  Head CT 08/13/2015. FINDINGS: Brain: Mild motion artifact, including on postcontrast images. No restricted diffusion to suggest acute infarction. No midline shift, mass effect, evidence of mass lesion, ventriculomegaly, extra-axial collection or acute intracranial hemorrhage. Cervicomedullary junction and pituitary are within normal limits. No abnormal enhancement identified.  No dural thickening Some cerebral volume loss since 2016. Patchy, widely scattered bilateral cerebral white matter T2 and FLAIR hyperintensity. Similar signal heterogeneity in the pons. And a pontine chronic microhemorrhage is evident on series 9, image 15. No other chronic cerebral blood products identified. No definite cortical encephalomalacia. Deep gray matter nuclei and cerebellum appear spared. Vascular: Major intracranial vascular flow voids are preserved, the distal right vertebral artery appears dominant. The major dural venous sinuses are enhancing and appear to be patent. Skull and upper cervical spine: Prominent arachnoid granulation in the midline occipital bone (normal variant series 8, image 14 and series 7, image 12. No destructive bone lesion identified. Negative for age visible cervical spine. Sinuses/Orbits: Orbits appear negative aside from postoperative changes to both globes. Paranasal sinuses and mastoids are clear. Other: Visible internal auditory structures appear normal. Negative visible scalp and face. IMPRESSION: 1. Mildly motion degraded exam. No metastatic disease or acute intracranial abnormality  identified. 2. Moderate for age signal changes in the cerebral white matter and pons, compatible with chronic small vessel disease. Electronically Signed   By: HGenevie AnnM.D.   On: 07/05/2022 07:29   [place]ORACENTESIS ASP PLEURAL SPACE W/IMG GUIDE  Result Date: 07/04/2022 INDICATION: Pleural effusion, history of breast cancer, shortness of breath. Request for diagnostic and therapeutic right thoracentesis. EXAM: ULTRASOUND GUIDED DIAGNOSTIC AND THERAPEUTIC RIGHT THORACENTESIS MEDICATIONS: 10 mL 1 % lidocaine COMPLICATIONS: None immediate. PROCEDURE: An ultrasound guided thoracentesis was thoroughly discussed with the patient and questions answered. The benefits, risks, alternatives and complications were also discussed. The patient understands and wishes to proceed with the procedure. Written consent was obtained. Ultrasound was performed to localize and mark an adequate pocket of fluid in the right chest. The area was then prepped and draped in the normal sterile fashion. 1% Lidocaine was used for local anesthesia. Under ultrasound guidance a 6 Fr Safe-T-Centesis catheter was introduced. Thoracentesis was performed. The catheter was removed and a dressing applied. FINDINGS: A total of approximately 2.5 L of serosanguineous fluid was removed. Samples were sent to the laboratory as requested by the clinical team. IMPRESSION: Successful ultrasound guided right thoracentesis yielding 2.5 L of pleural fluid. Read by: SNarda Rutherford AGNP-BC Electronically Signed   By: FMiachel RouxM.D.   On: 07/04/2022 07:35   CT CHEST WO CONTRAST  Result Date: 07/03/2022 CLINICAL DATA:  Pleural effusion.  History of breast cancer. EXAM: CT CHEST WITHOUT CONTRAST TECHNIQUE: Multidetector CT imaging of the chest was performed following the standard protocol without IV contrast. RADIATION DOSE REDUCTION: This exam was performed according to the departmental dose-optimization program which includes automated exposure control, adjustment  of the mA and/or kV according to patient size and/or use of iterative reconstruction technique. COMPARISON:  None Available. FINDINGS: Cardiovascular: Heart is normal in size. Small volume pericardial effusion. No evidence of thoracic aortic aneurysm. Atherosclerotic calcifications of the aortic arch. Coronary  atherosclerosis of the LAD and left circumflex. Mediastinum/Nodes: Bulky mediastinal lymphadenopathy, including a 2.8 cm short axis right paratracheal node (series 2/image 85) and a 4.5 cm subcarinal nodal mass. Tumor likely extends to the right hilar region, although this is poorly evaluated on unenhanced CT. Visualized thyroid is unremarkable. Lungs/Pleura: Endobronchial soft tissue occluding the right lower lobe bronchus (series 2/image 33). Associated masslike right lower lobe opacity, likely reflecting a combination of central tumor and postobstructive atelectasis/collapse, although ill-defined on unenhanced CT. A central tumor component of 5.6 cm is suspected (series 2/image 87). Complete right middle lobe atelectasis/collapse. Trace residual right pleural effusion. Associated pleural-based nodularity (series 2/images 50, 72, and 75), reflecting pleural metastases. 6 mm right upper lobe nodule (series 5/image 69), indeterminate. Left lung is clear. Mild centrilobular emphysematous changes. No pneumothorax. Upper Abdomen: Visualized upper abdomen is notable for 12 mm right adrenal nodule (series 2/image 150), new from 2020 CT abdomen/pelvis, suspicious for metastasis. Vascular calcifications. Musculoskeletal: Mild degenerative changes of the lower thoracic spine. IMPRESSION: Suspected large right lower lobe mass, poorly evaluated on unenhanced CT, with associated right middle and lower lobe atelectasis/collapse. This appearance is suspicious for primary bronchogenic neoplasm. Bulky mediastinal nodal metastases. Residual small right pleural effusion with pleural-based metastases. Suspected small right  adrenal metastasis. Aortic Atherosclerosis (ICD10-I70.0) and Emphysema (ICD10-J43.9). Electronically Signed   By: Julian Hy M.D.   On: 07/03/2022 19:34   DG Chest Port 1 View  Result Date: 07/03/2022 CLINICAL DATA:  Post right thoracentesis. EXAM: PORTABLE CHEST 1 VIEW COMPARISON:  07/02/2022 FINDINGS: Examination demonstrates interval improvement in patient's right-sided pleural effusion with residual small to moderate effusion present. No pneumothorax. Left lung unchanged. Cardiomediastinal silhouette and remainder of the exam is unchanged. IMPRESSION: Significant interval improvement in patient's right pleural effusion post thoracentesis. No pneumothorax. Electronically Signed   By: Marin Olp M.D.   On: 07/03/2022 16:37   DG CHEST PORT 1 VIEW  Result Date: 07/02/2022 CLINICAL DATA:  Pleural effusion EXAM: PORTABLE CHEST 1 VIEW COMPARISON:  06/30/2022 FINDINGS: Stable heart size. Aortic atherosclerosis. Moderate-large right pleural effusion with associated right basilar opacity, similar to prior. Left lung appears clear. No pneumothorax. IMPRESSION: Moderate-large right pleural effusion with associated right basilar opacity, similar to prior. Electronically Signed   By: Davina Poke D.O.   On: 07/02/2022 14:12   US THORACENTESIS ASP PLEURAL SPACE W/IMG GUIDE  Result Date: 06/30/2022 INDICATION: Pleural effusion, history of breast cancer, shortness of breath EXAM: ULTRASOUND GUIDED RIGHT THORACENTESIS MEDICATIONS: None. COMPLICATIONS: None immediate. PROCEDURE: An ultrasound guided thoracentesis was thoroughly discussed with the patient and questions answered. The benefits, risks, alternatives and complications were also discussed. The patient understands and wishes to proceed with the procedure. Written consent was obtained. Ultrasound was performed to localize and mark an adequate pocket of fluid in the right chest. The area was then prepped and draped in the normal sterile fashion. 1%  Lidocaine was used for local anesthesia. Under ultrasound guidance a 6 Fr Safe-T-Centesis catheter was introduced. Thoracentesis was performed. The catheter was removed and a dressing applied. FINDINGS: A total of approximately 1.3L of red pleural fluid was removed. Samples were sent to the laboratory as requested by the clinical team. IMPRESSION: Successful ultrasound guided right thoracentesis yielding 1.3L of pleural fluid. Performed and dictated by Pasty Spillers, PA-C Electronically Signed   By: Markus Daft M.D.   On: 06/30/2022 21:31   DG CHEST PORT 1 VIEW  Result Date: 06/30/2022 CLINICAL DATA:  Post right-sided thoracentesis. EXAM: PORTABLE CHEST  1 VIEW COMPARISON:  06/29/2022 FINDINGS: Examination demonstrates a moderate to large right pleural effusion with mild interval improvement post thoracentesis. No right-sided pneumothorax. Left lung is clear. Mild stable cardiomegaly. Remainder of the exam is unchanged. IMPRESSION: 1. Moderate to large right pleural effusion with mild interval improvement post thoracentesis. No pneumothorax. 2. Mild stable cardiomegaly. Electronically Signed   By: Marin Olp M.D.   On: 06/30/2022 13:15   DG Chest 2 View  Result Date: 06/29/2022 CLINICAL DATA:  Dyspnea, pleural effusion EXAM: CHEST - 2 VIEW COMPARISON:  Radiographs 06/25/2022 FINDINGS: Since 06/25/2022, slightly increased moderate-large right pleural effusion and associated atelectasis. Remainder unchanged. Left basilar atelectasis. Chronic interstitial coarsening. Aortic calcification. Unchanged cardiomediastinal silhouette. No acute osseous abnormality. IMPRESSION: Increased moderate-large right pleural effusion. Electronically Signed   By: Placido Sou M.D.   On: 06/29/2022 15:36   DG Chest 2 View  Result Date: 06/25/2022 CLINICAL DATA:  Shortness of breath.  Atrial fibrillation. EXAM: CHEST - 2 VIEW COMPARISON:  05/02/2022 FINDINGS: Moderate to large right pleural effusion occupies the lower 1/2  of right hemithorax. This is new from prior exam. Suspect associated atelectasis with basilar volume loss. Grossly stable heart size and mediastinal contours, aortic atherosclerosis. Chronic interstitial coarsening. No pneumothorax. No acute osseous findings. IMPRESSION: Moderate to large right pleural effusion, new from prior exam. Electronically Signed   By: Keith Rake M.D.   On: 06/25/2022 19:22    ASSESSMENT: This is a very pleasant 76 years old white female recently diagnosed with extensive stage (T3, N2, M1b) small cell lung cancer presented with large obstructive right lower lobe lung mass in addition to other pulmonary nodules and right hilar and mediastinal lymphadenopathy and suspicious right adrenal metastasis diagnosed in July 2023.  PLAN: I had a lengthy discussion with the patient and her son and daughter today about her current disease stage, prognosis and treatment options. I personally and independently reviewed the scan images and discussed the result and showed the images to the patient and her family. I recommended for the patient to complete the staging work-up by ordering a PET scan to rule out any other metastatic disease. I explained to the patient that she has incurable condition and all the treatment will be of palliative nature. I discussed with the patient her prognosis with and without treatment.  She understands that without treatment the median overall survival for patient with extensive stage small cell lung cancer may be in the range of 3 months but with the current standard treatment including chemotherapy plus immunotherapy it is currently around 12.5-13 months. I explained to the patient that her treatment options will be either palliative care and hospice referral versus consideration of palliative systemic treatment with a combination of carboplatin for AUC of 5 on day 1, etoposide 100 Mg/M2 on days 1, 2 and 3 with Cosela 240 Mg/M2 before the chemotherapy for  Myeloprotection as well as Imfinzi 1500 Mg IV on day 1 every 3 weeks initially with the chemotherapy and then every 4 weeks as maintenance if the patient has no evidence of disease progression after cycle #4. I discussed with the patient and her family the adverse effect of this treatment including but not limited to alopecia, myelosuppression, nausea and vomiting, peripheral neuropathy, liver or renal dysfunction as well as immunotherapy adverse effect and risk of infection and even death from complication of the treatment. The patient would like to take some time to discuss with her family about her treatment options and once she let  us know, I will arrange her treatment to start within 1 week. In the meantime I will refer the patient to radiation oncology for consideration of palliative radiotherapy to the obstructive right lower lobe lung mass. For the recurrent right pleural effusion, will consider The patient for repeat ultrasound-guided thoracentesis depending on her symptoms. The family mentioned that they may consider moving their mom close to the residency so they can provide care for her during her treatment.  I will be happy to make the referral to the local oncologist of their choice once they decide on that option. I will wait to hear from the patient and her family regarding her final decision of receiving treatment and also the location of her treatment. The patient was advised to call immediately if she has any other concerning symptoms in the interval.  The patient voices understanding of current disease status and treatment options and is in agreement with the current care plan.  All questions were answered. The patient knows to call the clinic with any problems, questions or concerns. We can certainly see the patient much sooner if necessary.  Thank you so much for allowing me to participate in the care of Melody Casey. I will continue to follow up the patient with you and assist in  her care.  The total time spent in the appointment was 90 minutes.  Disclaimer: This note was dictated with voice recognition software. Similar sounding words can inadvertently be transcribed and may not be corrected upon review.   Eilleen Kempf July 13, 2022, 8:43 AM

## 2022-07-13 NOTE — Telephone Encounter (Signed)
8/4 @ 1:25 pm Left voicemail at the request of patient to leave appointment info.  Will call back to confirm once talk to her daughter.  Put hold on schedule.

## 2022-07-14 ENCOUNTER — Emergency Department (HOSPITAL_COMMUNITY): Payer: Medicare Other

## 2022-07-14 ENCOUNTER — Other Ambulatory Visit: Payer: Self-pay

## 2022-07-14 ENCOUNTER — Encounter (HOSPITAL_COMMUNITY): Payer: Self-pay

## 2022-07-14 ENCOUNTER — Inpatient Hospital Stay (HOSPITAL_COMMUNITY)
Admission: EM | Admit: 2022-07-14 | Discharge: 2022-07-20 | DRG: 180 | Disposition: A | Discharge status: Hospice | Payer: Medicare Other | Attending: Internal Medicine | Admitting: Internal Medicine

## 2022-07-14 DIAGNOSIS — E1122 Type 2 diabetes mellitus with diabetic chronic kidney disease: Secondary | ICD-10-CM | POA: Diagnosis present

## 2022-07-14 DIAGNOSIS — R5381 Other malaise: Secondary | ICD-10-CM | POA: Diagnosis not present

## 2022-07-14 DIAGNOSIS — M05741 Rheumatoid arthritis with rheumatoid factor of right hand without organ or systems involvement: Secondary | ICD-10-CM

## 2022-07-14 DIAGNOSIS — Z91018 Allergy to other foods: Secondary | ICD-10-CM | POA: Diagnosis not present

## 2022-07-14 DIAGNOSIS — Z955 Presence of coronary angioplasty implant and graft: Secondary | ICD-10-CM | POA: Diagnosis not present

## 2022-07-14 DIAGNOSIS — Z882 Allergy status to sulfonamides status: Secondary | ICD-10-CM | POA: Diagnosis not present

## 2022-07-14 DIAGNOSIS — Z8673 Personal history of transient ischemic attack (TIA), and cerebral infarction without residual deficits: Secondary | ICD-10-CM

## 2022-07-14 DIAGNOSIS — Z888 Allergy status to other drugs, medicaments and biological substances status: Secondary | ICD-10-CM | POA: Diagnosis not present

## 2022-07-14 DIAGNOSIS — C7971 Secondary malignant neoplasm of right adrenal gland: Secondary | ICD-10-CM | POA: Diagnosis present

## 2022-07-14 DIAGNOSIS — Z96652 Presence of left artificial knee joint: Secondary | ICD-10-CM | POA: Diagnosis present

## 2022-07-14 DIAGNOSIS — R0600 Dyspnea, unspecified: Secondary | ICD-10-CM | POA: Diagnosis not present

## 2022-07-14 DIAGNOSIS — Z853 Personal history of malignant neoplasm of breast: Secondary | ICD-10-CM

## 2022-07-14 DIAGNOSIS — I4819 Other persistent atrial fibrillation: Secondary | ICD-10-CM | POA: Diagnosis present

## 2022-07-14 DIAGNOSIS — Z801 Family history of malignant neoplasm of trachea, bronchus and lung: Secondary | ICD-10-CM

## 2022-07-14 DIAGNOSIS — I2510 Atherosclerotic heart disease of native coronary artery without angina pectoris: Secondary | ICD-10-CM | POA: Diagnosis present

## 2022-07-14 DIAGNOSIS — I1310 Hypertensive heart and chronic kidney disease without heart failure, with stage 1 through stage 4 chronic kidney disease, or unspecified chronic kidney disease: Secondary | ICD-10-CM | POA: Diagnosis present

## 2022-07-14 DIAGNOSIS — J910 Malignant pleural effusion: Secondary | ICD-10-CM | POA: Diagnosis present

## 2022-07-14 DIAGNOSIS — Z66 Do not resuscitate: Secondary | ICD-10-CM | POA: Diagnosis present

## 2022-07-14 DIAGNOSIS — C50411 Malignant neoplasm of upper-outer quadrant of right female breast: Secondary | ICD-10-CM | POA: Diagnosis not present

## 2022-07-14 DIAGNOSIS — C3432 Malignant neoplasm of lower lobe, left bronchus or lung: Principal | ICD-10-CM | POA: Diagnosis present

## 2022-07-14 DIAGNOSIS — I714 Abdominal aortic aneurysm, without rupture, unspecified: Secondary | ICD-10-CM | POA: Diagnosis present

## 2022-07-14 DIAGNOSIS — E118 Type 2 diabetes mellitus with unspecified complications: Secondary | ICD-10-CM | POA: Diagnosis not present

## 2022-07-14 DIAGNOSIS — Z170 Estrogen receptor positive status [ER+]: Secondary | ICD-10-CM

## 2022-07-14 DIAGNOSIS — J90 Pleural effusion, not elsewhere classified: Principal | ICD-10-CM

## 2022-07-14 DIAGNOSIS — N1831 Chronic kidney disease, stage 3a: Secondary | ICD-10-CM | POA: Diagnosis present

## 2022-07-14 DIAGNOSIS — Z515 Encounter for palliative care: Secondary | ICD-10-CM

## 2022-07-14 DIAGNOSIS — E876 Hypokalemia: Secondary | ICD-10-CM

## 2022-07-14 DIAGNOSIS — Z9861 Coronary angioplasty status: Secondary | ICD-10-CM | POA: Diagnosis not present

## 2022-07-14 DIAGNOSIS — F32A Depression, unspecified: Secondary | ICD-10-CM | POA: Diagnosis present

## 2022-07-14 DIAGNOSIS — F411 Generalized anxiety disorder: Secondary | ICD-10-CM

## 2022-07-14 DIAGNOSIS — Z87891 Personal history of nicotine dependence: Secondary | ICD-10-CM | POA: Diagnosis not present

## 2022-07-14 DIAGNOSIS — C3481 Malignant neoplasm of overlapping sites of right bronchus and lung: Secondary | ICD-10-CM | POA: Diagnosis not present

## 2022-07-14 DIAGNOSIS — Z91048 Other nonmedicinal substance allergy status: Secondary | ICD-10-CM

## 2022-07-14 DIAGNOSIS — R846 Abnormal cytological findings in specimens from respiratory organs and thorax: Secondary | ICD-10-CM | POA: Diagnosis not present

## 2022-07-14 DIAGNOSIS — Z7901 Long term (current) use of anticoagulants: Secondary | ICD-10-CM

## 2022-07-14 DIAGNOSIS — E785 Hyperlipidemia, unspecified: Secondary | ICD-10-CM | POA: Diagnosis present

## 2022-07-14 DIAGNOSIS — Z885 Allergy status to narcotic agent status: Secondary | ICD-10-CM | POA: Diagnosis not present

## 2022-07-14 DIAGNOSIS — Z833 Family history of diabetes mellitus: Secondary | ICD-10-CM

## 2022-07-14 DIAGNOSIS — Z7189 Other specified counseling: Secondary | ICD-10-CM | POA: Diagnosis not present

## 2022-07-14 DIAGNOSIS — I10 Essential (primary) hypertension: Secondary | ICD-10-CM | POA: Diagnosis present

## 2022-07-14 DIAGNOSIS — F419 Anxiety disorder, unspecified: Secondary | ICD-10-CM | POA: Diagnosis present

## 2022-07-14 DIAGNOSIS — J9601 Acute respiratory failure with hypoxia: Secondary | ICD-10-CM | POA: Diagnosis present

## 2022-07-14 DIAGNOSIS — I252 Old myocardial infarction: Secondary | ICD-10-CM

## 2022-07-14 DIAGNOSIS — Z79899 Other long term (current) drug therapy: Secondary | ICD-10-CM

## 2022-07-14 DIAGNOSIS — C3490 Malignant neoplasm of unspecified part of unspecified bronchus or lung: Secondary | ICD-10-CM | POA: Diagnosis not present

## 2022-07-14 DIAGNOSIS — R0602 Shortness of breath: Secondary | ICD-10-CM | POA: Diagnosis not present

## 2022-07-14 DIAGNOSIS — Z8249 Family history of ischemic heart disease and other diseases of the circulatory system: Secondary | ICD-10-CM

## 2022-07-14 LAB — CBC WITH DIFFERENTIAL/PLATELET
Abs Immature Granulocytes: 0.04 10*3/uL (ref 0.00–0.07)
Basophils Absolute: 0 10*3/uL (ref 0.0–0.1)
Basophils Relative: 0 %
Eosinophils Absolute: 0.1 10*3/uL (ref 0.0–0.5)
Eosinophils Relative: 1 %
HCT: 37.9 % (ref 36.0–46.0)
Hemoglobin: 12.4 g/dL (ref 12.0–15.0)
Immature Granulocytes: 0 %
Lymphocytes Relative: 13 %
Lymphs Abs: 1.4 10*3/uL (ref 0.7–4.0)
MCH: 28.2 pg (ref 26.0–34.0)
MCHC: 32.7 g/dL (ref 30.0–36.0)
MCV: 86.1 fL (ref 80.0–100.0)
Monocytes Absolute: 0.9 10*3/uL (ref 0.1–1.0)
Monocytes Relative: 8 %
Neutro Abs: 8.3 10*3/uL — ABNORMAL HIGH (ref 1.7–7.7)
Neutrophils Relative %: 78 %
Platelets: 363 10*3/uL (ref 150–400)
RBC: 4.4 MIL/uL (ref 3.87–5.11)
RDW: 15.2 % (ref 11.5–15.5)
WBC: 10.6 10*3/uL — ABNORMAL HIGH (ref 4.0–10.5)
nRBC: 0 % (ref 0.0–0.2)

## 2022-07-14 LAB — COMPREHENSIVE METABOLIC PANEL
ALT: 27 U/L (ref 0–44)
AST: 24 U/L (ref 15–41)
Albumin: 2.8 g/dL — ABNORMAL LOW (ref 3.5–5.0)
Alkaline Phosphatase: 90 U/L (ref 38–126)
Anion gap: 7 (ref 5–15)
BUN: 38 mg/dL — ABNORMAL HIGH (ref 8–23)
CO2: 23 mmol/L (ref 22–32)
Calcium: 9.8 mg/dL (ref 8.9–10.3)
Chloride: 104 mmol/L (ref 98–111)
Creatinine, Ser: 1.05 mg/dL — ABNORMAL HIGH (ref 0.44–1.00)
GFR, Estimated: 54 mL/min — ABNORMAL LOW (ref >60)
Glucose, Bld: 163 mg/dL — ABNORMAL HIGH (ref 70–99)
Potassium: 4.9 mmol/L (ref 3.5–5.1)
Sodium: 134 mmol/L — ABNORMAL LOW (ref 135–145)
Total Bilirubin: 0.6 mg/dL (ref 0.3–1.2)
Total Protein: 6.3 g/dL — ABNORMAL LOW (ref 6.5–8.1)

## 2022-07-14 LAB — BLOOD GAS, VENOUS
Acid-base deficit: 4.8 mmol/L — ABNORMAL HIGH (ref 0.0–2.0)
Bicarbonate: 20.5 mmol/L (ref 20.0–28.0)
O2 Saturation: 49.9 %
Patient temperature: 37
pCO2, Ven: 38 mmHg — ABNORMAL LOW (ref 44–60)
pH, Ven: 7.34 (ref 7.25–7.43)
pO2, Ven: 32 mmHg (ref 32–45)

## 2022-07-14 LAB — TYPE AND SCREEN
ABO/RH(D): B POS
Antibody Screen: NEGATIVE

## 2022-07-14 LAB — PROTIME-INR
INR: 1.2 (ref 0.8–1.2)
Prothrombin Time: 15.4 seconds — ABNORMAL HIGH (ref 11.4–15.2)

## 2022-07-14 LAB — HEMOGLOBIN A1C
Hgb A1c MFr Bld: 6.4 % — ABNORMAL HIGH (ref 4.8–5.6)
Mean Plasma Glucose: 136.98 mg/dL

## 2022-07-14 LAB — TROPONIN I (HIGH SENSITIVITY)
Troponin I (High Sensitivity): 12 ng/L (ref <18)
Troponin I (High Sensitivity): 11 ng/L (ref <18)

## 2022-07-14 LAB — CK: Total CK: 29 U/L — ABNORMAL LOW (ref 38–234)

## 2022-07-14 LAB — MAGNESIUM: Magnesium: 1.9 mg/dL (ref 1.7–2.4)

## 2022-07-14 LAB — APTT: aPTT: 25 seconds (ref 24–36)

## 2022-07-14 LAB — GLUCOSE, CAPILLARY: Glucose-Capillary: 150 mg/dL — ABNORMAL HIGH (ref 70–99)

## 2022-07-14 LAB — PHOSPHORUS: Phosphorus: 3 mg/dL (ref 2.5–4.6)

## 2022-07-14 MED ORDER — FUROSEMIDE 40 MG PO TABS
40.0000 mg | ORAL_TABLET | Freq: Every day | ORAL | Status: DC
  Administered 2022-07-15 – 2022-07-20 (×5): 40 mg via ORAL
  Filled 2022-07-14 (×6): qty 1

## 2022-07-14 MED ORDER — THIAMINE HCL 100 MG PO TABS
100.0000 mg | ORAL_TABLET | Freq: Every day | ORAL | Status: DC
  Administered 2022-07-14 – 2022-07-20 (×7): 100 mg via ORAL
  Filled 2022-07-14 (×7): qty 1

## 2022-07-14 MED ORDER — DOFETILIDE 250 MCG PO CAPS
250.0000 ug | ORAL_CAPSULE | Freq: Two times a day (BID) | ORAL | Status: DC
  Administered 2022-07-14 – 2022-07-20 (×12): 250 ug via ORAL
  Filled 2022-07-14 (×13): qty 1

## 2022-07-14 MED ORDER — MAGNESIUM OXIDE -MG SUPPLEMENT 400 (240 MG) MG PO TABS
400.0000 mg | ORAL_TABLET | Freq: Two times a day (BID) | ORAL | Status: DC
  Administered 2022-07-14 – 2022-07-20 (×12): 400 mg via ORAL
  Filled 2022-07-14 (×12): qty 1

## 2022-07-14 MED ORDER — METOPROLOL SUCCINATE ER 25 MG PO TB24
25.0000 mg | ORAL_TABLET | Freq: Every day | ORAL | Status: DC
  Administered 2022-07-15 – 2022-07-20 (×4): 25 mg via ORAL
  Filled 2022-07-14 (×6): qty 1

## 2022-07-14 MED ORDER — INSULIN ASPART 100 UNIT/ML IJ SOLN
0.0000 [IU] | INTRAMUSCULAR | Status: DC
  Administered 2022-07-14 – 2022-07-15 (×3): 1 [IU] via SUBCUTANEOUS
  Administered 2022-07-15: 2 [IU] via SUBCUTANEOUS
  Administered 2022-07-15: 1 [IU] via SUBCUTANEOUS
  Administered 2022-07-16: 2 [IU] via SUBCUTANEOUS
  Administered 2022-07-16 – 2022-07-17 (×7): 1 [IU] via SUBCUTANEOUS
  Filled 2022-07-14: qty 0.09

## 2022-07-14 MED ORDER — ALBUTEROL SULFATE (2.5 MG/3ML) 0.083% IN NEBU
2.5000 mg | INHALATION_SOLUTION | RESPIRATORY_TRACT | Status: DC | PRN
  Administered 2022-07-15 – 2022-07-19 (×2): 2.5 mg via RESPIRATORY_TRACT
  Filled 2022-07-14 (×2): qty 3

## 2022-07-14 MED ORDER — ALPRAZOLAM 0.25 MG PO TABS
0.2500 mg | ORAL_TABLET | Freq: Three times a day (TID) | ORAL | Status: DC | PRN
Start: 2022-07-14 — End: 2022-07-20
  Administered 2022-07-14 – 2022-07-20 (×11): 0.25 mg via ORAL
  Filled 2022-07-14 (×11): qty 1

## 2022-07-14 MED ORDER — SODIUM CHLORIDE 0.9% FLUSH
3.0000 mL | Freq: Two times a day (BID) | INTRAVENOUS | Status: DC
Start: 2022-07-14 — End: 2022-07-20
  Administered 2022-07-14 – 2022-07-19 (×10): 3 mL via INTRAVENOUS

## 2022-07-14 MED ORDER — SODIUM CHLORIDE 0.9 % IV SOLN
250.0000 mL | INTRAVENOUS | Status: DC | PRN
  Administered 2022-07-17: 250 mL via INTRAVENOUS

## 2022-07-14 MED ORDER — SODIUM CHLORIDE 0.9% FLUSH: 3.0000 mL | INTRAVENOUS | Status: DC | PRN

## 2022-07-14 MED ORDER — ATORVASTATIN CALCIUM 40 MG PO TABS
60.0000 mg | ORAL_TABLET | Freq: Every day | ORAL | Status: DC
  Administered 2022-07-14 – 2022-07-19 (×6): 60 mg via ORAL
  Filled 2022-07-14 (×6): qty 1

## 2022-07-14 NOTE — Assessment & Plan Note (Addendum)
Appreciate pulmonology consult plan for possible thoracentesis tomorrow.  N.p.o. postmidnight hold Eliquis May need plurex catheter placement

## 2022-07-14 NOTE — Assessment & Plan Note (Signed)
Dr. Earlie Server is aware. Email sent Patient family is discussing if she wishes to continue care here or in [place]. We will order palliative care consult as family would like to start this conversation

## 2022-07-14 NOTE — Assessment & Plan Note (Signed)
Continue xanax 0.25 mg TID prn

## 2022-07-14 NOTE — ED Triage Notes (Addendum)
PT reports she woke up this morning with increased sob. Also endorses generalized weakness and fatigue. Pt was diagnosed with small cell lung cancer yesterday and has pleural effusions.

## 2022-07-14 NOTE — Assessment & Plan Note (Signed)
Continue Lipitor 40 mg a day and Toprol 25 mg p.o. daily.  Hold aspirin

## 2022-07-14 NOTE — Assessment & Plan Note (Signed)
Continue Lipitor 40 mg p.o. daily 

## 2022-07-14 NOTE — ED Provider Notes (Signed)
[facility] DEPT Provider Note   CSN: [id number] Arrival date & time: 07/14/22  1623     History  Chief Complaint  Patient presents with   Shortness of Breath    Melody Casey is a 76 y.o. female. 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  Patient with new diagnosis of small cell lung carcinoma.  She has had recurring malignant pleural effusion with the cancer.  She had a thoracentesis performed last weekend with 1.5 L drained and her last thoracentesis this past Monday today with 2 L drained.  She is followed by Dr. Lorna Few of oncology who is also recommending palliative radiation for her symptoms.  Since her most recent thoracentesis she has been experiencing increasing shortness of breath at rest and upon ambulation and when lying flat.  She feels most comfortable on her right side.  She feels difficult taking in a deep breath.  She has had no chest pain, leg pain or swelling, fevers, cough, vomiting, diarrhea.   Shortness of Breath      Home Medications Prior to Admission medications   Medication Sig Start Date End Date Taking? Authorizing Provider  albuterol (PROVENTIL) (2.5 MG/3ML) 0.083% nebulizer solution Take 3 mLs (2.5 mg total) by nebulization every 4 (four) hours as needed for wheezing or shortness of breath. 07/09/22  Yes Vann, Jessica U, DO  ALPRAZolam (XANAX) 0.25 MG tablet Take 1 tablet (0.25 mg total) by mouth 3 (three) times daily as needed for anxiety. Please put prescription on file and cancel any old prescriptions. 05/28/22  Yes Dettinger, [name], MD  apixaban (ELIQUIS) 5 MG TABS tablet Take 1 tablet by mouth 2 times daily. 07/09/22  Yes Vann, Jessica U, DO  atorvastatin (LIPITOR) 40 MG tablet TAKE 1 AND 1/2 TABLETS BY MOUTH EVERY DAY 06/15/22  Yes  Minus Breeding, MD  dofetilide (TIKOSYN) 250 MCG capsule Take 1 capsule (250 mcg total) by mouth 2 (two) times daily. 12/22/21  Yes Loel Dubonnet, NP  furosemide (LASIX) 40 MG tablet Take 1 tablet (40 mg total) by mouth daily as needed for edema. 07/09/22 10/07/22 Yes Vann, Jessica U, DO  lisinopril (ZESTRIL) 20 MG tablet Take 1 tablet (20 mg total) by mouth daily. 10/04/21  Yes Dettinger, [name], MD  magnesium oxide (MAG-OX) 400 MG tablet Take 400 mg by mouth 2 (two) times daily.   Yes [provider]  metoprolol succinate (TOPROL-XL) 25 MG 24 hr tablet Take one tablet daily 02/23/22  Yes Dettinger, [name], MD  potassium chloride (KLOR-CON M) 10 MEQ tablet Take 1 tablet (10 mEq total) by mouth daily as needed (if taking lasix). 07/12/22  Yes Geradine Girt, DO      Allergies    Miralax [polyethylene glycol], Vancomycin, Acetaminophen, Banana, Oxycodone-acetaminophen, Sulfa antibiotics, Sulfacetamide sodium, Sulfasalazine, Cefepime, Latex, Oxycodone-acetaminophen, Sulfacetamide sodium, Sulfacetamide sodium-sulfur, and Tape    Review of Systems   Review of Systems  Respiratory:  Positive for shortness of breath.     Physical Exam Updated Vital Signs BP 135/61   Pulse 70   Temp 98.1 F (36.7 C)   Resp 16   Ht 5\' 7"  (1.702 m)   Wt 73.5 kg   SpO2 94%   BMI 25.37 kg/m  Physical Exam Constitutional: Alert and oriented.  Nontoxic, mild respiratory distress Eyes: Conjunctivae are normal.  ENT      Head: Normocephalic and atraumatic.      Nose: No congestion.      Mouth/Throat: Mucous membranes are moist.      Neck: No stridor. Cardiovascular: S1, S2,  Normal and symmetric distal pulses are present in all extremities.Warm and well perfused. Respiratory: Deep inspiration, tachypnea mid 20s-low 30s, satting 89-93% on RA, decreased BS on right lung to mid chest Gastrointestinal: Soft and nontender.  Musculoskeletal: Normal range of motion in all extremities.      Right lower  leg: No tenderness or edema.      Left lower leg: No tenderness or edema. Neurologic: Normal speech and language. No gross focal neurologic deficits are appreciated. Skin: Skin is warm, dry and intact. No rash noted. Psychiatric: Mood and affect are normal. Speech and behavior are normal.  ED Results / Procedures / Treatments   Labs (all labs ordered are listed, but only abnormal results are displayed) Labs Reviewed  BLOOD GAS, VENOUS - Abnormal; Notable for the following components:      Result Value   pCO2, Ven 38 (*)    Acid-base deficit 4.8 (*)    All other components within normal limits  COMPREHENSIVE METABOLIC PANEL - Abnormal; Notable for the following components:   Sodium 134 (*)    Glucose, Bld 163 (*)    BUN 38 (*)    Creatinine, Ser 1.05 (*)    Total Protein 6.3 (*)    Albumin 2.8 (*)    GFR, Estimated 54 (*)    All other components within normal limits  CBC WITH DIFFERENTIAL/PLATELET - Abnormal; Notable for the following components:   WBC 10.6 (*)    Neutro Abs 8.3 (*)    All other components within normal limits  PROTIME-INR - Abnormal; Notable for the following components:   Prothrombin Time 15.4 (*)    All other components within normal limits  APTT  TYPE AND SCREEN  TROPONIN I (HIGH SENSITIVITY)  TROPONIN I (HIGH SENSITIVITY)    EKG EKG Interpretation  Date/Time:  Saturday July 14 2022 16:33:05 EDT Ventricular Rate:  85 PR Interval:  81 QRS Duration: 96 QT Interval:  385 QTC Calculation: 453 R Axis:   90 Text Interpretation: Right and left arm electrode reversal, interpretation assumes no reversal Sinus rhythm Paired ventricular premature complexes Aberrant conduction of SV complex(es) Short PR interval Borderline right axis deviation Left ventricular hypertrophy Mild ST depression lateral leads V5/V6 no reciprocal change Confirmed by Georgina Snell ([id number]) on 07/14/2022 4:56:52 PM  Radiology DG Chest 2 View  Result Date: 07/14/2022 CLINICAL DATA:   Shortness of breath, history of malignant effusion EXAM: CHEST - 2 VIEW COMPARISON:  07/08/2022 FINDINGS: Mild cardiomegaly. Slight interval increase in a large right pleural effusion with increased atelectasis or consolidation of the right lung. The left lung is normally aerated. Osseous structures unremarkable. IMPRESSION: Slight interval increase in a large right pleural effusion with increased atelectasis or consolidation of the right lung. The left lung is normally aerated. Electronically Signed   By: Delanna Ahmadi M.D.   On: 07/14/2022 17:03    Procedures Procedures  Remain on constant cardiac and pulse ox monitoring, normal sinus rhythm.  Medications Ordered in ED Medications - No data to display  ED Course/ Medical Decision Making/ A&P Clinical Course as of 07/14/22 1833  Sat Jul 14, 2022  1743 Spoke with pulmonology NP who will discuss case with Dr. Silas Flood and decide if he would like to come down to the  ER and evaluate patient perform thoracentesis in the ER. [VB]  6283 Spoke with Dr. Jearld Shines of pulmonology who will evaluate patient tomorrow and put in formal consult recommendations.  He will also put in a note now regarding Eliquis holding.  She should hold her Eliquis tonight.  No hypercarbia on VBG.  pH 734, PCO2 38.  No anemia hemoglobin 12.4.  Mild leukocytosis 10.6 with left shift which I suspect secondary to cancer, no infectious complaints concerning for superimposed pneumonia.  Will page hospitalist for admission for symptomatic recurring malignant effusion and pulmonology consult. [VB]  1517 High-sensitivity troponin 12 and reassuring, no concern for demand ischemia in the setting of large effusion.  Discussed case with hospitalist will put in orders for admission. [VB]    Clinical Course User Index [VB] Elgie Congo, MD                           Medical Decision Making JUEL RIPLEY is a 76 y.o. female. 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  Suspect her symptoms are likely most consistent with recurring pleural effusion confirmed on chest x-ray.  No concern for pneumonia as patient is afebrile with no infectious complaints.  No concern for PE as she is adequately anticoagulated on Eliquis and no signs of DVT on exam, no tachycardia.  No concern for atypical ACS with EKG similar to previous but will trend troponins to evaluate for any evidence of cardiac strain from pleural effusion.  Patient is quite symptomatic on exam with tachypnea mid 20s to low 30s sitting up with deep inspiration.  She is satting 89 to 93% on room air.  Because she is so symptomatic from recurrent pleural effusion status post repeat thoracenteses, suspect she will likely need repeat thoracentesis or Pleurx catheter.  Amount and/or Complexity of Data Reviewed Independent Historian:     Details: family External Data Reviewed: notes. Labs: ordered. Decision-making details documented in ED Course. Radiology: ordered and independent interpretation performed. Decision-making details documented in ED Course.    Details: Chest x-ray with large right pleural effusion increased from most recent chest x-ray July 08, 2022. ECG/medicine tests:  Decision-making details documented in ED Course.  Risk Decision regarding hospitalization.   Final Clinical Impression(s) / ED Diagnoses Final diagnoses:  Pleural effusion  Dyspnea, unspecified type    Rx / DC Orders ED Discharge Orders     None         Elgie Congo, MD 07/14/22 [phone]

## 2022-07-14 NOTE — Assessment & Plan Note (Signed)
Continue Toprol 25 mg p.o. daily

## 2022-07-14 NOTE — Assessment & Plan Note (Addendum)
Secondary to pleural effusion patient currently on 2 L FiO2 satting 94% was started on oxygen by EMS in the setting of severe shortness of breath initially. May need O2 at home  Will recheck O 2 sat after tohracentesis

## 2022-07-14 NOTE — Assessment & Plan Note (Signed)
Order sliding scale monitor blood sugars

## 2022-07-14 NOTE — H&P (Addendum)
Melody Casey QQV:[id number] DOB: Feb 24, 1946 DOA: 07/14/2022     PCP: Dettinger, [name], MD   Outpatient Specialists:   CARDS:  Dr.Hockrein   Pulmonary  Dr.Byrum   Oncology   Dr.Mohamed    Patient arrived to ER on 07/14/22 at 72 Referred by Attending Elgie Congo, MD   Patient coming from:    home Lives  With family    Chief Complaint:   Chief Complaint  Patient presents with   Shortness of Breath    HPI: Melody Casey is a 76 y.o. 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  Had thoracentesis 07/03/2022 with 2.5 L removed.  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  Significant dyspnea.  Patient presented to emergency department    She does not smoke since 2015, no ETOH   Denies any Fever no chills no CP      Regarding pertinent Chronic problems:    Hyperlipidemia -  on statins Lipitor (atorvastatin)  Lipid Panel     Component Value Date/Time   CHOL 113 04/20/2022 1112   TRIG 121 04/20/2022 1112   HDL 40 04/20/2022 1112   CHOLHDL 2.8 04/20/2022 1112   CHOLHDL 6.2 02/14/2011 0330   VLDL 26 02/14/2011 0330   LDLCALC 51 04/20/2022 1112   LDLDIRECT 158.6 04/05/2008 0833   LABVLDL 22 04/20/2022 1112     HTN on lisinopril Toprol     CAD  - On Aspirin, statin, betablocker, Plavix                 -  followed by cardiology                NSTEMI 02/2011: occ mid Cx, DES to OM2, residual nonobst LAD dz     DM 2 -  Lab Results  Component Value Date   HGBA1C 6.4 (H) 04/20/2022   diet controlled       A. Fib -  - CHA2DS2 vas score 6        current  on anticoagulation with   Eliquis,     -  Rate control:  Currently controlled with  Toprolol,             - Rhythm control: TIKOSYN History of TIA on Eliquis and Lipitor  Non-small cell lung cancer followed by oncology. Dr. Julien Nordmann plan was to order PET scan plan for palliative care treatment Plan to try radiation therapy and patient will discuss possibility of starting immunotherapy Given recurrent pleural effusion may she may need recurrent ultrasound-guided thoracentesis [place] versus Pleurx catheter  While in ER: Clinical Course as of 07/14/22 1841  Sat Jul 14, 2022  1743 Spoke with pulmonology NP who will discuss case with Dr. Silas Flood and decide if he would like to come down to the ER and evaluate patient perform thoracentesis in the ER. [VB]  [place] with Dr. Jearld Shines of pulmonology who will evaluate patient tomorrow and put in formal consult recommendations.  He will also put in a note now regarding Eliquis holding.  She should hold her Eliquis tonight.  No hypercarbia on VBG.  pH 734, PCO2 38.  No anemia hemoglobin 12.4.  Mild leukocytosis 10.6 with left shift which I suspect secondary to cancer, no infectious complaints concerning for superimposed pneumonia.  Will page hospitalist for admission for symptomatic recurring malignant effusion and pulmonology consult. [VB]  3474 High-sensitivity troponin 12 and reassuring, no concern for demand ischemia in the setting of large effusion.  Discussed case with hospitalist will put in orders for admission. [VB]    Clinical Course User Index [VB] Elgie Congo, MD    CXR - Slight interval increase in a large right pleural effusion with increased atelectasis or consolidation of the right lung. The left lung is normally aerated    Following Medications were ordered in ER: Medications - No data to display  _______________________________________________________ ER Provider Called:    Jearld Shines of pulmonology They Recommend admit to medicine   Will see in AM       ED Triage Vitals  Enc Vitals Group     BP 07/14/22 1633 128/73     Pulse Rate 07/14/22 1633 78     Resp 07/14/22 1633 (!) 23     Temp 07/14/22 1634 (!) 97.4 F (36.3 C)     Temp Source 07/14/22 1634 Oral     SpO2 07/14/22 1634 93 %     Weight 07/14/22 1637 162 lb (73.5 kg)     Height 07/14/22 1637 5\' 7"  (1.702 m)     Head Circumference --      Peak Flow --      Pain Score 07/14/22 1635 0     Pain Loc --      Pain Edu? --      Excl. in [place]? --   TMAX(24)@     _________________________________________ Significant initial  Findings: Abnormal Labs Reviewed  BLOOD GAS, VENOUS - Abnormal; Notable for the following components:      Result Value   pCO2, Ven 38 (*)    Acid-base deficit 4.8 (*)    All other components within normal limits  COMPREHENSIVE METABOLIC PANEL - Abnormal; Notable for the following components:   Sodium 134 (*)    Glucose, Bld 163 (*)    BUN 38 (*)    Creatinine, Ser 1.05 (*)    Total Protein 6.3 (*)    Albumin 2.8 (*)    GFR, Estimated 54 (*)    All other components within normal limits  CBC WITH DIFFERENTIAL/PLATELET - Abnormal; Notable for the following components:   WBC 10.6 (*)    Neutro Abs 8.3 (*)    All other components within normal limits  PROTIME-INR - Abnormal; Notable for the following components:   Prothrombin Time 15.4 (*)    All other components within normal limits     _________________________ Troponin 14 ECG: Ordered Personally reviewed and interpreted by me showing: HR : 85 Rhythm:  Right and left arm electrode reversal, interpretation assumes no reversal Sinus rhythm Paired ventricular premature complexes Aberrant conduction of SV complex(es) Short PR interval Borderline right axis deviation Left ventricular hypertrophy QTC 453    The recent clinical data is shown below. Vitals:   07/14/22 1637 07/14/22 1800 07/14/22 1802 07/14/22 1814  BP:   132/62 135/61  Pulse:  69  70  Resp:  18  16  Temp:    98.1 F (36.7 C)   TempSrc:      SpO2:  94%  94%  Weight: 73.5 kg     Height: 5\' 7"  (1.702 m)        WBC  Component Value Date/Time   WBC 10.6 (H) 07/14/2022 1729   LYMPHSABS 1.4 07/14/2022 1729   LYMPHSABS 1.4 04/20/2022 1112   LYMPHSABS 0.8 (L) 06/06/2017 1259   MONOABS 0.9 07/14/2022 1729   MONOABS 0.2 06/06/2017 1259   EOSABS 0.1 07/14/2022 1729   EOSABS 0.1 04/20/2022 1112   BASOSABS 0.0 07/14/2022 1729   BASOSABS 0.0 04/20/2022 1112   BASOSABS 0.0 06/06/2017 1259      Results for orders placed or performed during the hospital encounter of 06/29/22  Body fluid culture w Gram Stain     Status: None   Collection Time: 06/30/22  2:00 PM   Specimen: PATH Cytology Pleural fluid  Result Value Ref Range Status   Specimen Description   Final    PLEURAL Performed at [facility], [place] [address] [place], [place] [address]    Special Requests   Final    PLEURAL Performed at [facility], [place] [address], [place], [place] [address]    Gram Stain   Final    FEW WBC PRESENT, PREDOMINANTLY MONONUCLEAR NO ORGANISMS SEEN    Culture   Final    NO GROWTH 3 DAYS Performed at [place] [address] [place], [place] [address]    Report Status 07/04/2022 FINAL  Final  Body fluid culture w Gram Stain     Status: None   Collection Time: 07/03/22  4:09 PM   Specimen: PATH Cytology Pleural fluid  Result Value Ref Range Status   Specimen Description   Final    PLEURAL Performed at [place] [address] [place], [place] [address]    Special Requests   Final    NONE Performed at [facility], [place] [address], [place], [place] [address]    Gram Stain NO WBC SEEN NO ORGANISMS SEEN   Final   Culture   Final    NO GROWTH 3 DAYS Performed at [facility], [place] [address], [place], [place] [address]    Report Status 07/07/2022 FINAL  Final     _______________________________________________ Hospitalist was called for admission for   Pleural effusion  Acute respiratory failure with hypoxia   The following Work up has been ordered so far:  Orders Placed This Encounter  Procedures   DG Chest 2 View   Blood gas, venous   Comprehensive metabolic panel   CBC with Differential   Protime-INR   APTT   Consult to pulmonology   Consult to hospitalist   Type and screen [place] initial  Findings:  labs showing:    Recent Labs  Lab 07/08/22 0406 07/13/22 0833 07/14/22 1729  NA 133* 129* 134*  K 5.5* 4.8 4.9  CO2 19* 22 23  GLUCOSE 159* 163* 163*  BUN 70* 42* 38*  CREATININE 1.27* 1.00 1.05*  CALCIUM 9.8 9.7 9.8    Cr    stable,    Lab Results  Component Value Date   CREATININE 1.05 (H) 07/14/2022   CREATININE 1.00 07/13/2022   CREATININE 1.27 (H) 07/08/2022    Recent Labs  Lab 07/13/22 0833 07/14/22 1729  AST 23 24  ALT 27 27  ALKPHOS 92 90  BILITOT 0.4 0.6  PROT 6.5 6.3*  ALBUMIN 3.3* 2.8*   Lab Results  Component Value Date   CALCIUM 9.8 07/14/2022   PHOS 2.4 (L) 08/03/2018   PHOS 2.5 08/03/2018  Plt: Lab Results  Component Value Date   PLT 363 07/14/2022     Venous  Blood Gas result:  pH  pH, Ven 7.34 Acid-base deficit 4.8 High  mmol/L  pCO2, Ven 38 Low  mmHg O2 Saturation 49.9 %  pO2, Ven 32 mmHg         Recent Labs  Lab 07/13/22 0833 07/14/22 1729  WBC 9.7 10.6*  NEUTROABS 7.2 8.3*  HGB 12.5 12.4  HCT 37.2 37.9  MCV 83.6 86.1  PLT 358 363    HG/HCT  stable     Component Value Date/Time   HGB 12.4 07/14/2022 1729   HGB 12.5 07/13/2022 0833   HGB 12.1 04/20/2022 1112   HGB 12.5 06/06/2017 1259   HCT 37.9 07/14/2022 1729   HCT 36.9 04/20/2022 1112   HCT 38.3 06/06/2017 1259   MCV 86.1 07/14/2022 1729   MCV 87 04/20/2022 1112   MCV 87.2 06/06/2017 1259     BNP (last 3 results) Recent Labs    06/29/22 1522  07/04/22 0810  BNP 104.0* 26.6      DM  labs:  HbA1C: Recent Labs    08/25/21 1423 12/25/21 0928 04/20/22 1102  HGBA1C 6.5* 7.1* 6.4*    CBG (last 3)  No results for input(s): "GLUCAP" in the last 72 hours.     Cultures:    Component Value Date/Time   SDES  07/03/2022 1609    PLEURAL Performed at [facility], [place] [address], [place], [place] [address]    SPECREQUEST  07/03/2022 1609    NONE Performed at [facility], [place] [address] [place], [place] [address]    CULT  07/03/2022 1609    NO GROWTH 3 DAYS Performed at [place] [address] [place], [place] [address]    REPTSTATUS 07/07/2022 FINAL 07/03/2022 1609     Radiological Exams on Admission: DG Chest 2 View  Result Date: 07/14/2022 CLINICAL DATA:  Shortness of breath, history of malignant effusion EXAM: CHEST - 2 VIEW COMPARISON:  07/08/2022 FINDINGS: Mild cardiomegaly. Slight interval increase in a large right pleural effusion with increased atelectasis or consolidation of the right lung. The left lung is normally aerated. Osseous structures unremarkable. IMPRESSION: Slight interval increase in a large right pleural effusion with increased atelectasis or consolidation of the right lung. The left lung is normally aerated. Electronically Signed   By: Delanna Ahmadi M.D.   On: 07/14/2022 17:03   _______________________________________________________________________________________________________ Latest  Blood pressure 135/61, pulse 70, temperature 98.1 F (36.7 C), resp. rate 16, height 5\' 7"  (1.702 m), weight 73.5 kg, SpO2 94 %.   Vitals  labs and radiology finding personally reviewed  Review of Systems:    Pertinent positives include:  shortness of breath at rest. dyspnea on exertion,   Constitutional:  No weight loss, night sweats, Fevers, chills, fatigue, weight loss  HEENT:  No headaches, Difficulty swallowing,Tooth/dental problems,Sore throat,  No  sneezing, itching, ear ache, nasal congestion, post nasal drip,  Cardio-vascular:  No chest pain, Orthopnea, PND, anasarca, dizziness, palpitations.no Bilateral lower extremity swelling  GI:  No heartburn, indigestion, abdominal pain, nausea, vomiting, diarrhea, change in bowel habits, loss of appetite, melena, blood in stool, hematemesis Resp:  no  NoNo excess mucus, no productive cough, No non-productive cough, No coughing up of blood.No change in color of mucus.No wheezing. Skin:  no rash or lesions. No jaundice GU:  no dysuria, change in color of urine, no urgency or frequency. No  straining to urinate.  No flank pain.  Musculoskeletal:  No joint pain or no joint swelling. No decreased range of motion. No back pain.  Psych:  No change in mood or affect. No depression or anxiety. No memory loss.  Neuro: no localizing neurological complaints, no tingling, no weakness, no double vision, no gait abnormality, no slurred speech, no confusion  All systems reviewed and apart from [place] all are negative _______________________________________________________________________________________________ Past Medical History:   Past Medical History:  Diagnosis Date   AAA (abdominal aortic aneurysm) ([place])    Acute diverticulitis    Anxiety    Arthritis    Atrial fibrillation ([place])    a. Dx 03/2011 - on tikosyn/coumadin.   CAD (coronary artery disease)    a. NSTEMI 02/2011: occ mid Cx, DES to OM2, residual nonobst LAD dz.   Cancer [facility])    breast   Diabetes mellitus    Diverticulitis    Diverticulitis of colon     2008, 04/2011, 12/2014, 08/2015   Dysrhythmia    Foot deformity 1947   right; ??scleroderma   Hemangioma    liver   HLD (hyperlipidemia)    HTN (hypertension)    Osteoporosis    Tobacco abuse    stopped smoking 2012   Ureteral obstruction    History of gross hematuria/right hydronephrosis 2/2 to uteropelvic junction obstruction, s/p cystoscopy in January 2007 with bilateral  retrograde pyelography, right ureter arthroscopy, right ureteral stent placement, bladder biopsies, stent removal since then.   Wears dentures    top     Past Surgical History:  Procedure Laterality Date   BIOPSY N/A 11/01/2015   Procedure: BIOPSY;  Surgeon: Danie Binder, MD;  Location: [facility];  Service: Endoscopy;  Laterality: N/A;   BIOPSY  07/06/2022   Procedure: BIOPSY;  Surgeon: Collene Gobble, MD;  [facility];  Service: Cardiopulmonary;;   BREAST LUMPECTOMY WITH NEEDLE LOCALIZATION AND AXILLARY SENTINEL LYMPH NODE BX Right 03/01/2014   Procedure: BREAST LUMPECTOMY WITH NEEDLE LOCALIZATION AND AXILLARY SENTINEL LYMPH NODE BX;  Surgeon: Edward Jolly, MD;  Location: [place];  Service: General;  Laterality: Right;   BREAST SURGERY     BRONCHIAL BRUSHINGS  07/06/2022   Procedure: BRONCHIAL BRUSHINGS;  Surgeon: Collene Gobble, MD;  [facility];  Service: Cardiopulmonary;;   CARDIAC CATHETERIZATION  2012   stent   COLON RESECTION N/A 07/25/2018   Procedure: COLOSTOMY;  Surgeon: Rolm Bookbinder, MD;  Location: [place];  Service: General;  Laterality: N/A;   COLONOSCOPY WITH PROPOFOL N/A 06/29/2014   Dr. Wynetta Emery: universal diverticulosis   CORONARY STENT PLACEMENT  2012   ENDOBRONCHIAL ULTRASOUND Bilateral 07/06/2022   Procedure: ENDOBRONCHIAL ULTRASOUND;  Surgeon: Collene Gobble, MD;  [facility];  Service: Cardiopulmonary;  Laterality: Bilateral;   ESOPHAGOGASTRODUODENOSCOPY (EGD) WITH PROPOFOL N/A 11/01/2015   Procedure: ESOPHAGOGASTRODUODENOSCOPY (EGD) WITH PROPOFOL;  Surgeon: Danie Binder, MD;  Location[facility];  Service: Endoscopy;  Laterality: N/A;   FINE NEEDLE ASPIRATION  07/06/2022   Procedure: FINE NEEDLE ASPIRATION (FNA) LINEAR;  Surgeon: Collene Gobble, MD;  [facility];  Service: Cardiopulmonary;;   KNEE ARTHROSCOPY     left   LAPAROTOMY N/A 07/25/2018   Procedure: EXPLORATORY LAPAROTOMY;  Surgeon:  Rolm Bookbinder, MD;  Location: [place];  Service: General;  Laterality: N/A;   PARTIAL COLECTOMY N/A 07/25/2018   Procedure: COLECTOMY;  Surgeon: Rolm Bookbinder, MD;  Location: [place];  Service: General;  Laterality: N/A;  Right leg surgery  age 36   As a child for  ?scleroderma per patient   TONSILLECTOMY AND ADENOIDECTOMY     TOTAL KNEE ARTHROPLASTY Left 01/01/2022   Procedure: LEFT TOTAL KNEE ARTHROPLASTY;  Surgeon: Frederik Pear, MD;  [facility];  Service: Orthopedics;  Laterality: Left;   TUBAL LIGATION     Ureteral surgery  2011   rt ureterostomy-stent   VIDEO BRONCHOSCOPY N/A 07/06/2022   Procedure: VIDEO BRONCHOSCOPY WITHOUT FLUORO;  Surgeon: Collene Gobble, MD;  [facility];  Service: Cardiopulmonary;  Laterality: N/A;    Social History:  Ambulatory   walker      reports that she quit smoking about 11 years ago. Her smoking use included cigarettes. She has a 100.00 pack-year smoking history. She has never used smokeless tobacco. She reports that she does not drink alcohol and does not use drugs.     Family History:   Family History  Problem Relation Age of Onset   Lung cancer Father 25       deceased   Cancer Father    Heart failure Mother 14       deceased   Heart disease Mother    Diabetes Brother    Hypertension Brother    Cancer Brother        prostate   Colon cancer Neg Hx    Liver disease Neg Hx    ______________________________________________________________________________________________ Allergies: Allergies  Allergen Reactions   Miralax [Polyethylene Glycol] Swelling and Rash    Took [facility] brand developed rash. Patient states she tolerated name brand MiraLax in the past.  09/01/15. Patient states she had diffuse swelling including swelling of her lips.    Vancomycin Rash and Shortness Of Breath   Acetaminophen Hives   Banana Other (See Comments)    Upset stomach   Oxycodone-Acetaminophen Itching   Sulfa Antibiotics Rash    All  over rash   Sulfacetamide Sodium Rash    All over rash   Sulfasalazine Rash    All over rash    Cefepime     rash   Latex Rash   Oxycodone-Acetaminophen Rash   Sulfacetamide Sodium Rash   Sulfacetamide Sodium-Sulfur Rash   Tape Rash     Prior to Admission medications   Medication Sig Start Date End Date Taking? Authorizing Provider  albuterol (PROVENTIL) (2.5 MG/3ML) 0.083% nebulizer solution Take 3 mLs (2.5 mg total) by nebulization every 4 (four) hours as needed for wheezing or shortness of breath. 07/09/22  Yes Vann, Jessica U, DO  ALPRAZolam (XANAX) 0.25 MG tablet Take 1 tablet (0.25 mg total) by mouth 3 (three) times daily as needed for anxiety. Please put prescription on file and cancel any old prescriptions. 05/28/22  Yes Dettinger, [name], MD  apixaban (ELIQUIS) 5 MG TABS tablet Take 1 tablet by mouth 2 times daily. 07/09/22  Yes Vann, Jessica U, DO  atorvastatin (LIPITOR) 40 MG tablet TAKE 1 AND 1/2 TABLETS BY MOUTH EVERY DAY 06/15/22  Yes Minus Breeding, MD  dofetilide (TIKOSYN) 250 MCG capsule Take 1 capsule (250 mcg total) by mouth 2 (two) times daily. 12/22/21  Yes Loel Dubonnet, NP  furosemide (LASIX) 40 MG tablet Take 1 tablet (40 mg total) by mouth daily as needed for edema. 07/09/22 10/07/22 Yes Vann, Jessica U, DO  lisinopril (ZESTRIL) 20 MG tablet Take 1 tablet (20 mg total) by mouth daily. 10/04/21  Yes Dettinger, [name], MD  magnesium oxide (MAG-OX) 400 MG tablet Take 400 mg by  mouth 2 (two) times daily.   Yes [provider]  metoprolol succinate (TOPROL-XL) 25 MG 24 hr tablet Take one tablet daily 02/23/22  Yes Dettinger, [name], MD  potassium chloride (KLOR-CON M) 10 MEQ tablet Take 1 tablet (10 mEq total) by mouth daily as needed (if taking lasix). 07/12/22  Yes Geradine Girt, DO    ___________________________________________________________________________________________________ Physical Exam:    07/14/2022    6:14 PM 07/14/2022    6:02 PM  07/14/2022    6:00 PM  Vitals with BMI  Systolic 400 867   Diastolic 61 62   Pulse 70  69     1. General:  in No  Acute distress   Chronically ill   -appearing 2. Psychological: Alert and   Oriented 3. Head/ENT:   Dry Mucous Membranes                          Head Non traumatic, neck supple                         Poor Dentition 4. SKIN:  decreased Skin turgor,  Skin clean Dry and intact no rash 5. Heart: Regular rate and rhythm no  Murmur, no Rub or gallop 6. Lungs:  decreased on the right no wheezes or crackles   7. Abdomen: Soft,  non-tender, Non distended bowel sounds present 8. Lower extremities: no clubbing, cyanosis, no  edema 9. Neurologically Grossly intact, moving all 4 extremities equally   10. MSK: Normal range of motion    Chart has been reviewed  ______________________________________________________________________________________________  Assessment/Plan 76 y.o. 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 daily.  Hold aspirin  Dyslipidemia, goal LDL below 70 Continue Lipitor 40 mg p.o. daily  Essential hypertension Continue Toprol 25 mg p.o. daily  History of TIA (transient ischemic attack) Hold aspirin as patient may need to have  procedures done  Pleural effusion Appreciate pulmonology consult plan for possible thoracentesis tomorrow.  N.p.o. postmidnight hold Eliquis May need plurex catheter placement  Persistent atrial fibrillation ([place]) Hold Eliquis.  Continue Tikosyn 250 micrograms twice a day and metoprolol 25 mg p.o. 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 Earlie Server is aware. Email sent Patient family is discussing if she wishes to continue care here or in [place]. We will order palliative care consult as family would like to start this conversation  Type 2 diabetes mellitus with complication, without long-term current use of insulin ([place]) Order sliding scale monitor blood sugars  Acute respiratory failure with hypoxia ([place]) Secondary to pleural effusion patient currently on 2 L FiO2 satting 94% was started on oxygen by EMS in the setting of severe shortness of  breath initially. May need O2 at home  Will recheck O 2 sat after tohracentesis    Other plan as per orders.  DVT prophylaxis:  SCD        Code Status:  DNR/DNI  as per patient   I had personally discussed CODE STATUS with patient and family     Family Communication:   Family   at  Bedside  plan of care was discussed  with   Son and Daughter,   Disposition Plan:    To home once workup is complete and patient is stable   Following barriers for discharge:                                                     Will likely need home health, home O2, set up                           Will need consultants to evaluate patient prior to discharge                       Would benefit from PT/OT eval prior to DC  Ordered                                     Nutrition    consulted                                    Palliative care    consulted                   Consults called: Pulmonology is aware will see in AM Emailed oncology     Admission status:  ED Disposition     ED Disposition  [place]: [place] [[id number]]  Level of Care: Progressive [102]  Admit to Progressive based on following criteria: RESPIRATORY PROBLEMS hypoxemic/hypercapnic respiratory failure that is responsive to NIPPV (BiPAP) or High Flow Nasal Cannula (6-80 lpm). Frequent assessment/intervention, no > Q2 hrs < Q4 hrs, to maintain oxygenation and pulmonary hygiene.  May admit patient to [facility] or [facility] if equivalent level of care is available:: No  Covid Evaluation: Asymptomatic - no recent exposure (last 10 days) testing not required  Diagnosis: Pleural effusion [027741]  Admitting Physician: Toy Baker [[id number]]  Attending Physician: Toy Baker [[id number]]  Certification:: I certify this patient will need inpatient services for at least 2 midnights  Estimated Length of Stay: 2          inpatient     I Expect 2 midnight stay secondary to severity of patient's current illness need for inpatient interventions justified by the following:  hemodynamic instability despite optimal treatment ( hypoxia )   and extensive comorbidities including:    DM2   CAD malignancy,   Chronic anticoagulation  That are currently affecting medical management.   I expect  patient to be hospitalized for 2 midnights requiring inpatient medical care.  Patient is at high risk for adverse outcome (such as loss of life or disability) if not treated.  Indication  for inpatient stay as follows:   Need for operative/procedural  intervention      Level of care   progressive tele indefinitely please discontinue once patient no longer qualifies COVID-19 Labs     Lab Results  Component Value Date   [place] NEGATIVE 01/04/2022     Tamas Suen 07/14/2022, 8:17 PM    [facility]     after 2 AM please page floor coverage PA If 7AM-7PM, please contact the day team taking care of the patient using [email or web address]   Patient was evaluated in the  context of the global COVID-19 pandemic, which necessitated consideration that the patient might be at risk for infection with the SARS-CoV-2 virus that causes COVID-19. Institutional protocols and algorithms that pertain to the evaluation of patients at risk for COVID-19 are in a state of rapid change based on information released by regulatory bodies including the CDC and federal and state organizations. These policies and algorithms were followed during the patient's care.

## 2022-07-14 NOTE — Subjective & Objective (Signed)
Reports worsening SOB Has known recent diagnosis of  Non-small cell lung cancer diagnosed via bronchoscopy late 06/2022.  Had thoracentesis 07/03/2022 with 2.5 L removed.  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  Significant dyspnea.  Patient presented to emergency department

## 2022-07-14 NOTE — Assessment & Plan Note (Signed)
Hold aspirin as patient may need to have procedures done

## 2022-07-14 NOTE — Assessment & Plan Note (Signed)
Hold Eliquis.  Continue Tikosyn 250 micrograms twice a day and metoprolol 25 mg p.o. daily

## 2022-07-14 NOTE — Progress Notes (Addendum)
Brief PCCM progress note:  Contacted as patient was in the emergency room.  Presented with worsening dyspnea.  Non-small cell lung cancer diagnosed via bronchoscopy late 06/2022.  Had thoracentesis 07/03/2022 with 2.5 L removed.  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  Chest x-ray today looks worse than it did prior to pleural effusion but similar to prior to discharge last month.  Per ED doctor, patient has had this tapped at least once or twice in the interim.  This was outside of our healthcare system.  Patient takes Eliquis.  Review of vitals and discussing with ED doctor, patient is not hypoxemic.  On room air.  Certainly is dyspneic.  Per discussion with ED physician, plan for admission given severity of symptoms and frequency of reaccumulation of pleural fluid.  Recommendations: --Hold Eliquis to minimize risk of iatrogenic bleeding with procedures, ideally for 48 hours prior to thoracentesis for relief of dyspnea --Formal pulmonary consult to follow 8/6, will discuss Pleurx catheter with patient and family at that time

## 2022-07-15 DIAGNOSIS — J9601 Acute respiratory failure with hypoxia: Secondary | ICD-10-CM | POA: Diagnosis not present

## 2022-07-15 DIAGNOSIS — I2510 Atherosclerotic heart disease of native coronary artery without angina pectoris: Secondary | ICD-10-CM | POA: Diagnosis not present

## 2022-07-15 DIAGNOSIS — J90 Pleural effusion, not elsewhere classified: Secondary | ICD-10-CM | POA: Diagnosis not present

## 2022-07-15 DIAGNOSIS — F419 Anxiety disorder, unspecified: Secondary | ICD-10-CM | POA: Diagnosis not present

## 2022-07-15 LAB — CBC
HCT: 35.7 % — ABNORMAL LOW (ref 36.0–46.0)
Hemoglobin: 11.6 g/dL — ABNORMAL LOW (ref 12.0–15.0)
MCH: 28.4 pg (ref 26.0–34.0)
MCHC: 32.5 g/dL (ref 30.0–36.0)
MCV: 87.5 fL (ref 80.0–100.0)
Platelets: 310 10*3/uL (ref 150–400)
RBC: 4.08 MIL/uL (ref 3.87–5.11)
RDW: 15.2 % (ref 11.5–15.5)
WBC: 8.9 10*3/uL (ref 4.0–10.5)
nRBC: 0 % (ref 0.0–0.2)

## 2022-07-15 LAB — COMPREHENSIVE METABOLIC PANEL
ALT: 22 U/L (ref 0–44)
AST: 16 U/L (ref 15–41)
Albumin: 2.6 g/dL — ABNORMAL LOW (ref 3.5–5.0)
Alkaline Phosphatase: 78 U/L (ref 38–126)
Anion gap: 6 (ref 5–15)
BUN: 38 mg/dL — ABNORMAL HIGH (ref 8–23)
CO2: 23 mmol/L (ref 22–32)
Calcium: 9.2 mg/dL (ref 8.9–10.3)
Chloride: 105 mmol/L (ref 98–111)
Creatinine, Ser: 0.86 mg/dL (ref 0.44–1.00)
GFR, Estimated: >60 mL/min (ref >60)
Glucose, Bld: 126 mg/dL — ABNORMAL HIGH (ref 70–99)
Potassium: 4.6 mmol/L (ref 3.5–5.1)
Sodium: 134 mmol/L — ABNORMAL LOW (ref 135–145)
Total Bilirubin: 0.7 mg/dL (ref 0.3–1.2)
Total Protein: 5.7 g/dL — ABNORMAL LOW (ref 6.5–8.1)

## 2022-07-15 LAB — GLUCOSE, CAPILLARY
Glucose-Capillary: 108 mg/dL — ABNORMAL HIGH (ref 70–99)
Glucose-Capillary: 117 mg/dL — ABNORMAL HIGH (ref 70–99)
Glucose-Capillary: 127 mg/dL — ABNORMAL HIGH (ref 70–99)
Glucose-Capillary: 132 mg/dL — ABNORMAL HIGH (ref 70–99)
Glucose-Capillary: 137 mg/dL — ABNORMAL HIGH (ref 70–99)
Glucose-Capillary: 169 mg/dL — ABNORMAL HIGH (ref 70–99)

## 2022-07-15 LAB — PREALBUMIN: Prealbumin: 18 mg/dL (ref 18–38)

## 2022-07-15 NOTE — Consult Note (Signed)
NAME:  Melody Casey, MRN:  [id number], DOB:  October 08, 1946, LOS: 1 ADMISSION DATE:  07/14/2022, CONSULTATION DATE:  07/15/22 REFERRING MD: Dr. Darrick Meigs, TR[facility], CHIEF COMPLAINT: Dyspnea, shortness of breath  History of Present Illness:  75 year old woman admitted to ED with worsening dyspnea on exertion found to have enlarging right-sided pleural effusion.  She has a history of small cell lung cancer.  Recently diagnosed.  Had thoracentesis x2, 7/22 and 7/25.  1.5 L and 2.5 L the second time.  Cytology negative x2.  Underwent bronchoscopy with biopsy that showed small cell carcinoma.  Present with oncology.  I note reviewed.  Worsening shortness of breath started about 48 hours ago.  Presented to the ED late in the day yesterday.  Chest x-ray notable for slightly enlarged right-sided pleural effusion compared to 07/08/2022.  She was not hypoxemic.  Instructed to hold Eliquis for possible procedures.  She asked about Pleurx catheter.  Pertinent  Medical History  Small cell lung cancer  Significant Hospital Events: Including procedures, antibiotic start and stop dates in addition to other pertinent events   Admitted 8/5 with worsening shortness of breath not hypoxemic on presentation  Interim History / Subjective:  Admitted, initially not hypoxemic.  Not wearing oxygen.  Objective   Blood pressure 123/64, pulse 71, temperature 97.7 F (36.5 C), temperature source Oral, resp. rate (!) 22, height 5\' 7"  (1.702 m), weight 73.8 kg, SpO2 95 %.        Intake/Output Summary (Last 24 hours) at 07/15/2022 1049 Last data filed at 07/15/2022 1045 Gross per 24 hour  Intake 3 ml  Output --  Net 3 ml   Filed Weights   07/14/22 1637 07/14/22 2027  Weight: 73.5 kg 73.8 kg    Examination: General: Chronically ill-appearing, frail lying in bed HENT: Atraumatic, normocephalic, EOMI Lungs: Diminished on the right up to upper lung field, clear on left Cardiovascular: Warm, no edema Abdomen: Nondistended, bowel  sounds present Neuro: Appears globally weak, deconditioned, no focal deficits  Resolved Hospital Problem list   N/a  Assessment & Plan:  Large right pleural effusion: Suspect malignant in setting of small cell carcinoma.  Cytology x2 has been negative but exudative and lymphocytic predominant.  More symptomatic.  She is on Eliquis for A-fib.  Invasive procedures minimize risk of bleeding.  She is insistent on having she accepts risks she is at high risk of bleeding and death due to having 48 hours of Eliquis washout.  She expressed understanding success --Plan for thoracentesis today --Pleurx catheter which they are very interested in, patient and family at bedside, we will work to arrange while admitted, unclear if this can be performed at [place] longer needs to be performed at [facility] due to supply issues, will arrange physician to perform procedure as I do not do this procedure --Continue to hold Eliquis until decision on Pleurx catheter placement is made  Best Practice (right click and "Reselect all SmartList Selections" daily)   Per primary  Labs   CBC: Recent Labs  Lab 07/13/22 0833 07/14/22 1729 07/15/22 0421  WBC 9.7 10.6* 8.9  NEUTROABS 7.2 8.3*  --   HGB 12.5 12.4 11.6*  HCT 37.2 37.9 35.7*  MCV 83.6 86.1 87.5  PLT 358 363 935    Basic Metabolic Panel: Recent Labs  Lab 07/13/22 0833 07/14/22 1729 07/14/22 1935 07/15/22 0421  NA 129* 134*  --  134*  K 4.8 4.9  --  4.6  CL 101 104  --  105  CO2 22 23  --  23  GLUCOSE 163* 163*  --  126*  BUN 42* 38*  --  38*  CREATININE 1.00 1.05*  --  0.86  CALCIUM 9.7 9.8  --  9.2  MG  --   --  1.9  --   PHOS  --   --  3.0  --    GFR: Estimated Creatinine Clearance: 52.4 mL/min (by C-G formula based on SCr of 0.86 mg/dL). Recent Labs  Lab 07/13/22 0833 07/14/22 1729 07/15/22 0421  WBC 9.7 10.6* 8.9    Liver Function Tests: Recent Labs  Lab 07/13/22 0833 07/14/22 1729 07/15/22 0421  AST 23 24 16   ALT 27 27  22   ALKPHOS 92 90 78  BILITOT 0.4 0.6 0.7  PROT 6.5 6.3* 5.7*  ALBUMIN 3.3* 2.8* 2.6*   No results for input(s): "LIPASE", "AMYLASE" in the last 168 hours. No results for input(s): "AMMONIA" in the last 168 hours.  ABG    Component Value Date/Time   PHART 7.38 07/05/2022 0440   PCO2ART 35 07/05/2022 0440   PO2ART 67 (L) 07/05/2022 0440   HCO3 20.5 07/14/2022 1729   TCO2 22 07/26/2018 1350   ACIDBASEDEF 4.8 (H) 07/14/2022 1729   O2SAT 49.9 07/14/2022 1729     Coagulation Profile: Recent Labs  Lab 07/14/22 1729  INR 1.2    Cardiac Enzymes: Recent Labs  Lab 07/14/22 1935  CKTOTAL 29*    HbA1C: HB A1C (BAYER DCA - WAIVED)  Date/Time Value Ref Range Status  04/20/2022 11:02 AM 6.4 (H) 4.8 - 5.6 % Final    Comment:             Prediabetes: 5.7 - 6.4          Diabetes: >6.4          Glycemic control for adults with diabetes: <7.0   08/25/2021 02:23 PM 6.5 (H) 4.8 - 5.6 % Final    Comment:             Prediabetes: 5.7 - 6.4          Diabetes: >6.4          Glycemic control for adults with diabetes: <7.0               **Please note reference interval change**    Hgb A1c MFr Bld  Date/Time Value Ref Range Status  07/14/2022 07:35 PM 6.4 (H) 4.8 - 5.6 % Final    Comment:    (NOTE) Pre diabetes:          5.7%-6.4%  Diabetes:              >6.4%  Glycemic control for   <7.0% adults with diabetes   12/25/2021 09:28 AM 7.1 (H) 4.8 - 5.6 % Final    Comment:    (NOTE) Pre diabetes:          5.7%-6.4%  Diabetes:              >6.4%  Glycemic control for   <7.0% adults with diabetes     CBG: Recent Labs  Lab 07/09/22 1124 07/14/22 2016 07/15/22 0028 07/15/22 0326 07/15/22 0725  GLUCAP 133* 150* 127* 108* 117*    Review of Systems:   No chest pain.  No lower extremity swelling.  Comprehensive review of systems otherwise negative.  Past Medical History:  She,  has a past medical history of AAA (abdominal aortic aneurysm) ([place]), Acute diverticulitis,  Anxiety, Arthritis, Atrial fibrillation (  [place]), CAD (coronary artery disease), Cancer ([place]), Diabetes mellitus, Diverticulitis, Diverticulitis of colon ( ), Dysrhythmia, Foot deformity (1947), Hemangioma, HLD (hyperlipidemia), HTN (hypertension), Osteoporosis, Tobacco abuse, Ureteral obstruction, and Wears dentures.   Surgical History:   Past Surgical History:  Procedure Laterality Date   BIOPSY N/A 11/01/2015   Procedure: BIOPSY;  Surgeon: Danie Binder, MD;  Location: [facility];  Service: Endoscopy;  Laterality: N/A;   BIOPSY  07/06/2022   Procedure: BIOPSY;  Surgeon: Collene Gobble, MD;  Location: [facility];  Service: Cardiopulmonary;;   BREAST LUMPECTOMY WITH NEEDLE LOCALIZATION AND AXILLARY SENTINEL LYMPH NODE BX Right 03/01/2014   Procedure: BREAST LUMPECTOMY WITH NEEDLE LOCALIZATION AND AXILLARY SENTINEL LYMPH NODE BX;  Surgeon: Edward Jolly, MD;  Location: [place];  Service: General;  Laterality: Right;   BREAST SURGERY     BRONCHIAL BRUSHINGS  07/06/2022   Procedure: BRONCHIAL BRUSHINGS;  Surgeon: Collene Gobble, MD;  [facility];  Service: Cardiopulmonary;;   CARDIAC CATHETERIZATION  2012   stent   COLON RESECTION N/A 07/25/2018   Procedure: COLOSTOMY;  Surgeon: Rolm Bookbinder, MD;  Location: [place];  Service: General;  Laterality: N/A;   COLONOSCOPY WITH PROPOFOL N/A 06/29/2014   Dr. Wynetta Emery: universal diverticulosis   CORONARY STENT PLACEMENT  2012   ENDOBRONCHIAL ULTRASOUND Bilateral 07/06/2022   Procedure: ENDOBRONCHIAL ULTRASOUND;  Surgeon: Collene Gobble, MD;  [facility];  Service: Cardiopulmonary;  Laterality: Bilateral;   ESOPHAGOGASTRODUODENOSCOPY (EGD) WITH PROPOFOL N/A 11/01/2015   Procedure: ESOPHAGOGASTRODUODENOSCOPY (EGD) WITH PROPOFOL;  Surgeon: Danie Binder, MD;  Location: [facility];  Service: Endoscopy;  Laterality: N/A;   FINE NEEDLE ASPIRATION  07/06/2022   Procedure: FINE NEEDLE ASPIRATION (FNA) LINEAR;  Surgeon:  Collene Gobble, MD;  [facility];  Service: Cardiopulmonary;;   KNEE ARTHROSCOPY     left   LAPAROTOMY N/A 07/25/2018   Procedure: EXPLORATORY LAPAROTOMY;  Surgeon: Rolm Bookbinder, MD;  Location: [place];  Service: General;  Laterality: N/A;   PARTIAL COLECTOMY N/A 07/25/2018   Procedure: COLECTOMY;  Surgeon: Rolm Bookbinder, MD;  Location: [place];  Service: General;  Laterality: N/A;   Right leg surgery  age 39   As a child for  ?scleroderma per patient   TONSILLECTOMY AND ADENOIDECTOMY     TOTAL KNEE ARTHROPLASTY Left 01/01/2022   Procedure: LEFT TOTAL KNEE ARTHROPLASTY;  Surgeon: Frederik Pear, MD;  [facility];  Service: Orthopedics;  Laterality: Left;   TUBAL LIGATION     Ureteral surgery  2011   rt ureterostomy-stent   VIDEO BRONCHOSCOPY N/A 07/06/2022   Procedure: VIDEO BRONCHOSCOPY WITHOUT FLUORO;  Surgeon: Collene Gobble, MD;  [facility];  Service: Cardiopulmonary;  Laterality: N/A;     Social History:   reports that she quit smoking about 11 years ago. Her smoking use included cigarettes. She has a 100.00 pack-year smoking history. She has never used smokeless tobacco. She reports that she does not drink alcohol and does not use drugs.   Family History:  Her family history includes Cancer in her brother and father; Diabetes in her brother; Heart disease in her mother; Heart failure (age of onset: 77) in her mother; Hypertension in her brother; Lung cancer (age of onset: 46) in her father. There is no history of Colon cancer or Liver disease.   Allergies Allergies  Allergen Reactions   Miralax [Polyethylene Glycol] Swelling and Rash    Took [facility] brand developed rash. Patient states she tolerated name  brand MiraLax in the past.  09/01/15. Patient states she had diffuse swelling including swelling of her lips.    Vancomycin Rash and Shortness Of Breath   Acetaminophen Hives   Banana Other (See Comments)    Upset stomach   Oxycodone-Acetaminophen  Itching   Sulfa Antibiotics Rash    All over rash   Sulfacetamide Sodium Rash    All over rash   Sulfasalazine Rash    All over rash    Cefepime     rash   Latex Rash   Oxycodone-Acetaminophen Rash   Sulfacetamide Sodium Rash   Sulfacetamide Sodium-Sulfur Rash   Tape Rash     Home Medications  Prior to Admission medications   Medication Sig Start Date End Date Taking? Authorizing Provider  albuterol (PROVENTIL) (2.5 MG/3ML) 0.083% nebulizer solution Take 3 mLs (2.5 mg total) by nebulization every 4 (four) hours as needed for wheezing or shortness of breath. 07/09/22  Yes Vann, Jessica U, DO  ALPRAZolam (XANAX) 0.25 MG tablet Take 1 tablet (0.25 mg total) by mouth 3 (three) times daily as needed for anxiety. Please put prescription on file and cancel any old prescriptions. 05/28/22  Yes Dettinger, [name], MD  apixaban (ELIQUIS) 5 MG TABS tablet Take 1 tablet by mouth 2 times daily. 07/09/22  Yes Vann, Jessica U, DO  atorvastatin (LIPITOR) 40 MG tablet TAKE 1 AND 1/2 TABLETS BY MOUTH EVERY DAY 06/15/22  Yes Minus Breeding, MD  dofetilide (TIKOSYN) 250 MCG capsule Take 1 capsule (250 mcg total) by mouth 2 (two) times daily. 12/22/21  Yes Loel Dubonnet, NP  furosemide (LASIX) 40 MG tablet Take 1 tablet (40 mg total) by mouth daily as needed for edema. 07/09/22 10/07/22 Yes Vann, Jessica U, DO  lisinopril (ZESTRIL) 20 MG tablet Take 1 tablet (20 mg total) by mouth daily. 10/04/21  Yes Dettinger, [name], MD  magnesium oxide (MAG-OX) 400 MG tablet Take 400 mg by mouth 2 (two) times daily.   Yes [provider]  metoprolol succinate (TOPROL-XL) 25 MG 24 hr tablet Take one tablet daily 02/23/22  Yes Dettinger, [name], MD  potassium chloride (KLOR-CON M) 10 MEQ tablet Take 1 tablet (10 mEq total) by mouth daily as needed (if taking lasix). 07/12/22  Yes Geradine Girt, DO     Critical care time: n/a

## 2022-07-15 NOTE — Progress Notes (Addendum)
Melody Casey   DOB:26-Sep-1946   ER#:[id number]   PPJ#:[id number]  Oncology follow up   Subjective: Patient is known to our service, under Dr. Worthy Flank care for her newly diagnosed metastatic small cell lung cancer.  She was seen by Dr. Julien Nordmann in office 2 days ago, and was admitted to [facility] for worsening shortness of breath yesterday.  I am covering Dr. Julien Nordmann to see her in hospital.  She is very fatigued, daughter at the bedside.  She underwent thoracentesis today with 1 L of fluids removed.  She has been seen by pulmonary service, and plan to have Pleurx placed in the next few days.   Objective:  Vitals:   07/15/22 1215 07/15/22 1239  BP: (!) 144/105 (!) 122/55  Pulse:    Resp: 16 (!) 22  Temp:    SpO2: 96% 96%    Body mass index is 25.48 kg/m.  Intake/Output Summary (Last 24 hours) at 07/15/2022 1629 Last data filed at 07/15/2022 1259 Gross per 24 hour  Intake 3 ml  Output 450 ml  Net -447 ml     Sclerae unicteric, chronic ill-appearing  no peripheral edema  Neuro nonfocal    CBG (last 3)  Recent Labs    07/15/22 0326 07/15/22 0725 07/15/22 1157  GLUCAP 108* 117* 137*     Labs:    Urine Studies No results for input(s): "UHGB", "CRYS" in the last 72 hours.  Invalid input(s): "UACOL", "UAPR", "USPG", "UPH", "UTP", "UGL", "UKET", "UBIL", "UNIT", "UROB", "ULEU", "UEPI", "UWBC", "URBC", "UBAC", "CAST", "UCOM", "BILUA"  Basic Metabolic Panel: Recent Labs  Lab 07/13/22 0833 07/14/22 1729 07/14/22 1935 07/15/22 0421  NA 129* 134*  --  134*  K 4.8 4.9  --  4.6  CL 101 104  --  105  CO2 22 23  --  23  GLUCOSE 163* 163*  --  126*  BUN 42* 38*  --  38*  CREATININE 1.00 1.05*  --  0.86  CALCIUM 9.7 9.8  --  9.2  MG  --   --  1.9  --   PHOS  --   --  3.0  --    GFR Estimated Creatinine Clearance: 52.4 mL/min (by C-G formula based on SCr of 0.86 mg/dL). Liver Function Tests: Recent Labs  Lab 07/13/22 0833 07/14/22 1729 07/15/22 0421  AST 23 24 16    ALT 27 27 22   ALKPHOS 92 90 78  BILITOT 0.4 0.6 0.7  PROT 6.5 6.3* 5.7*  ALBUMIN 3.3* 2.8* 2.6*   No results for input(s): "LIPASE", "AMYLASE" in the last 168 hours. No results for input(s): "AMMONIA" in the last 168 hours. Coagulation profile Recent Labs  Lab 07/14/22 1729  INR 1.2    CBC: Recent Labs  Lab 07/13/22 0833 07/14/22 1729 07/15/22 0421  WBC 9.7 10.6* 8.9  NEUTROABS 7.2 8.3*  --   HGB 12.5 12.4 11.6*  HCT 37.2 37.9 35.7*  MCV 83.6 86.1 87.5  PLT 358 363 310   Cardiac Enzymes: Recent Labs  Lab 07/14/22 1935  CKTOTAL 29*   BNP: Invalid input(s): "POCBNP" CBG: Recent Labs  Lab 07/14/22 2016 07/15/22 0028 07/15/22 0326 07/15/22 0725 07/15/22 1157  GLUCAP 150* 127* 108* 117* 137*   D-Dimer No results for input(s): "DDIMER" in the last 72 hours. Hgb A1c Recent Labs    07/14/22 1935  HGBA1C 6.4*   Lipid Profile No results for input(s): "CHOL", "HDL", "LDLCALC", "TRIG", "CHOLHDL", "LDLDIRECT" in the last 72 hours. Thyroid function studies No  results for input(s): "TSH", "T4TOTAL", "T3FREE", "THYROIDAB" in the last 72 hours.  Invalid input(s): "FREET3" Anemia work up No results for input(s): "VITAMINB12", "FOLATE", "FERRITIN", "TIBC", "IRON", "RETICCTPCT" in the last 72 hours. Microbiology No results found for this or any previous visit (from the past 240 hour(s)).    Studies:  DG Chest 2 View  Result Date: 07/14/2022 CLINICAL DATA:  Shortness of breath, history of malignant effusion EXAM: CHEST - 2 VIEW COMPARISON:  07/08/2022 FINDINGS: Mild cardiomegaly. Slight interval increase in a large right pleural effusion with increased atelectasis or consolidation of the right lung. The left lung is normally aerated. Osseous structures unremarkable. IMPRESSION: Slight interval increase in a large right pleural effusion with increased atelectasis or consolidation of the right lung. The left lung is normally aerated. Electronically Signed   By: Delanna Ahmadi M.D.   On: 07/14/2022 17:03    Assessment: 76 y.o. female   Recurrent right pleural effusion, likely malignant Recently diagnosed extensive stage right small cell lung cancer Acute respiratory failure with hypoxia, secondary to #1 and #2 Atrial fibrillation Hypertension, CAD Diabetes History of TIA DNR   Plan:  -At her last visit with Dr. Earlie Server 2 days ago, palliative chemotherapy vs palliative and hospice care were discussed with patient and her family.  Patient is reluctant to take chemotherapy, and is open to hospice care. Daughter encouraged her to consider chemotherapy, but we will respect her decision if she decided not to try chemo.  She will discuss with her brother also. -I reviewed the benefit and side effect of chemotherapy again and encouraged pt to consider. I answered their questions. -I agree with Pleurx, which will be convenient for patient to manage at home. -Patient has agreed with DNR -Dr. Julien Nordmann will follow-up tomorrow.   Truitt Merle, MD 07/15/2022  4:29 PM

## 2022-07-15 NOTE — Progress Notes (Signed)
Patient refused to check pulse ox while ambulating. Patient feeling very fatigued and worn out this morning. She felt exhausted just getting up to sit on the side of the bed. With just that little movement oxygen saturations dropped to 84% on 3L. Patient took some deep breaths and laid back in bed and oxygen saturations back up to the low 90's. MD made aware. Patient currently laying in bed w/ call light within reach.

## 2022-07-15 NOTE — Plan of Care (Signed)
  Problem: Coping: Goal: Ability to adjust to condition or change in health will improve Outcome: Progressing   Problem: Health Behavior/Discharge Planning: Goal: Ability to manage health-related needs will improve Outcome: Progressing   Problem: Metabolic: Goal: Ability to maintain appropriate glucose levels will improve Outcome: Progressing   Problem: Skin Integrity: Goal: Risk for impaired skin integrity will decrease Outcome: Progressing

## 2022-07-15 NOTE — Progress Notes (Signed)
[facility]  PROGRESS NOTE  Melody Casey BJS:[id number] DOB: 28-May-1946 DOA: 07/14/2022 PCP: Dettinger, [name], MD   Brief HPI:   76 year old female with medical history of non-small cell lung cancer with recurrent pleural effusion, atrial fibrillation on Eliquis, CAD, diabetes mellitus type 2, hypertension, hyperlipidemia, stage I breast cancer in 2015, AAA, TAA presented with worsening shortness of breath.  Patient has recent diagnosis of non-small cell lung cancer diagnosed by bronchoscopy in July 2023.  She had thoracentesis on 07/03/2022 with 2.5 L of pleural fluid drained.  At that time there was no malignant cells identified, but felt to be malignant pleural effusion.  Chest x-ray obtained in the ED showed slight interval increase in the right pleural effusion with increased atelectasis/consolidation of right lung.  PCCM was consulted.    Subjective   Patient seen and examined, complains of worsening shortness of breath.  PCCM to see for thoracentesis today.  Thoracentesis was performed yesterday as patient is on Eliquis which is currently on hold.   Assessment/Plan:    Acute respiratory failure with hypoxemia -Secondary to right-sided pleural effusion -Currently on oxygen 3 L/min via nasal cannula  Right-sided pleural effusion -Likely malignant in setting of non-small cell lung cancer -Cytology has been negative in the past -Patient has been on Eliquis which is currently on hold for possible thoracentesis today -Patient also interested in getting Pleurx catheter -PCCM following  Non-small cell lung cancer -Followed by oncology Dr. Julien Nordmann as outpatient -Dr. Julien Nordmann  was supposed to order PET scan for palliative care treatment -Plan to try radiation therapy and patient will discuss possibility of starting immunotherapy -Dr. Julien Nordmann informed of patient's admission in the hospital  Persistent atrial fibrillation -Continue Tikosyn 250 mg twice a day, metoprolol 25 mg  daily -Patient takes Eliquis for anticoagulation which is currently on hold for thoracentesis today  Diabetes mellitus type 2 -Continue sliding scale insulin NovoLog -CBG well controlled  Hypertension -Blood pressure is stable -Continue metoprolol  History of TIA -Aspirin on hold for thoracentesis today  CAD s/p percutaneous coronary angioplasty -Continue Lipitor, Toprol XL -Aspirin on hold  Anxiety/depression -Continue Xanax 0.25 mg 3 times daily as needed   Medications     atorvastatin  60 mg Oral Daily   dofetilide  250 mcg Oral BID   furosemide  40 mg Oral Daily   insulin aspart  0-9 Units Subcutaneous Q4H   magnesium oxide  400 mg Oral BID   metoprolol succinate  25 mg Oral Daily   sodium chloride flush  3 mL Intravenous Q12H   thiamine  100 mg Oral Daily     Data Reviewed:   CBG:  Recent Labs  Lab 07/14/22 2016 07/15/22 0028 07/15/22 0326 07/15/22 0725 07/15/22 1157  GLUCAP 150* 127* 108* 117* 137*    SpO2: 96 % O2 Flow Rate (L/min): 3 L/min    Vitals:   07/15/22 0315 07/15/22 0725 07/15/22 1215 07/15/22 1239  BP: (!) 119/43 123/64 (!) 144/105 (!) 122/55  Pulse: 65 71    Resp: (!) 24 (!) 22 16 (!) 22  Temp: 98.1 F (36.7 C) 97.7 F (36.5 C)    TempSrc: Oral Oral    SpO2: 94% 95% 96% 96%  Weight:      Height:          Data Reviewed:  Basic Metabolic Panel: Recent Labs  Lab 07/13/22 0833 07/14/22 1729 07/14/22 1935 07/15/22 0421  NA 129* 134*  --  134*  K 4.8 4.9  --  4.6  CL 101 104  --  105  CO2 22 23  --  23  GLUCOSE 163* 163*  --  126*  BUN 42* 38*  --  38*  CREATININE 1.00 1.05*  --  0.86  CALCIUM 9.7 9.8  --  9.2  MG  --   --  1.9  --   PHOS  --   --  3.0  --     CBC: Recent Labs  Lab 07/13/22 0833 07/14/22 1729 07/15/22 0421  WBC 9.7 10.6* 8.9  NEUTROABS 7.2 8.3*  --   HGB 12.5 12.4 11.6*  HCT 37.2 37.9 35.7*  MCV 83.6 86.1 87.5  PLT 358 363 310    LFT Recent Labs  Lab 07/13/22 0833 07/14/22 1729  07/15/22 0421  AST 23 24 16   ALT 27 27 22   ALKPHOS 92 90 78  BILITOT 0.4 0.6 0.7  PROT 6.5 6.3* 5.7*  ALBUMIN 3.3* 2.8* 2.6*     Antibiotics: Anti-infectives (From admission, onward)    None        DVT prophylaxis: Eliquis on hold, SCDs  Code Status: DNR  Family Communication: No family at bedside   CONSULTS PCCM   Objective    Physical Examination:  General-appears in no acute distress Heart-S1-S2, regular, no murmur auscultated Lungs-decreased breath sounds at right lung base Abdomen-soft, nontender, no organomegaly Extremities-no edema in the lower extremities Neuro-alert, oriented x3, no focal deficit noted   Status is: Inpatient:             [name]   [facility] If 7PM-7AM, please contact night-coverage at [email or web address], Office  [phone]   07/15/2022, 2:39 PM  LOS: 1 day

## 2022-07-15 NOTE — Progress Notes (Signed)
PT Cancellation Note  Patient Details Name: Melody Casey MRN: [id number] DOB: 03/23/1946   Cancelled Treatment:     PT order received but eval deferred at pt request.  Per OT "patient is worn out waiting on procedure either today or tomorrow to drain fluid. asking for PT to check on her tomorrow".  Will follow.   Saya Mccoll 07/15/2022, 9:24 AM

## 2022-07-15 NOTE — Procedures (Signed)
Thoracentesis  Procedure Note  Melody Casey  [id number]  05-29-1946  Date:07/15/22  Time:12:44 PM   Provider Performing:Jarry Manon R Guerino Caporale   Procedure: Thoracentesis with imaging guidance (55974)  Indication(s) Pleural Effusion  Consent Risks of the procedure as well as the alternatives and risks of each were explained to the patient and/or caregiver.  Consent for the procedure was obtained and is signed in the bedside chart  Anesthesia Topical only with 1% lidocaine    Time Out Verified patient identification, verified procedure, site/side was marked, verified correct patient position, special equipment/implants available, medications/allergies/relevant history reviewed, required imaging and test results available.   Sterile Technique Maximal sterile technique including full sterile barrier drape, hand hygiene, sterile gown, sterile gloves, mask, hair covering, sterile ultrasound probe cover (if used).  Procedure Description Ultrasound was used to identify appropriate pleural anatomy for placement and overlying skin marked.  Area of drainage cleaned and draped in sterile fashion. 7 cc 1% Lidocaine was used to anesthetize the skin and subcutaneous tissue.  1000 cc's of serosanguinous appearing fluid was drained from the right pleural space. Procedure terminated at 1L in effort to achieve therapeutic relief but also leave fluid for possible PleurX procedure in coming days. Catheter then removed and bandaid applied to site.   Complications/Tolerance None; patient tolerated the procedure well.   EBL Minimal   Specimen(s) Pleural fluid

## 2022-07-15 NOTE — Evaluation (Signed)
Occupational Therapy Evaluation Patient Details Name: Melody Casey MRN: [id number] DOB: 06-30-1946 Today's Date: 07/15/2022   History of Present Illness Patient is a 76 year old female who was admitted with increased shortness of breath.patient recently diagnosed with lung cancer.  patient PMH: a fib, HTN, scleroderma, CAD s/p PCI, diverticulitis/perforated sigmoid colon with colostomy, DM, breast cancer, AAA, L TKA,   Clinical Impression   Patient is a 76 year old female who was living at home with neighbor and family support with recent d/c from hospital in end of July. Patient was noted to have increased need for supplemental O2 at 3L, decreased functional activity tolerance, decreased endurance, decreased standing balance, and decreased time out of bed impacting participation in ADLs. Patient is pending thoracentesis which was noted to improve patients over all participation in ADLs during last admission. Anticipate that patient will be able to progress to not need skilled OT services during acute stay. Patient would continue to benefit from skilled OT services at this time while admitted  d/c to address noted deficits in order to improve overall safety and independence in ADLs.        Recommendations for follow up therapy are one component of a multi-disciplinary discharge planning process, led by the attending physician.  Recommendations may be updated based on patient status, additional functional criteria and insurance authorization.   Follow Up Recommendations  No OT follow up    Assistance Recommended at Discharge PRN  Patient can return home with the following Assistance with cooking/housework;Assist for transportation;Help with stairs or ramp for entrance    Functional Status Assessment  Patient has had a recent decline in their functional status and demonstrates the ability to make significant improvements in function in a reasonable and predictable amount of time.  Equipment  Recommendations  None recommended by OT    Recommendations for Other Services       Precautions / Restrictions Precautions Precautions: Fall Precaution Comments: colostomy(2019), monitor O2 Restrictions Weight Bearing Restrictions: No      Mobility Bed Mobility Overal bed mobility: Needs Assistance Bed Mobility: Supine to Sit     Supine to sit: Min assist     General bed mobility comments: with increased time to sit on edge of bed.    Transfers                          Balance Overall balance assessment: Needs assistance Sitting-balance support: No upper extremity supported Sitting balance-Leahy Scale: Good                                     ADL either performed or assessed with clinical judgement   ADL Overall ADL's : Needs assistance/impaired Eating/Feeding: NPO   Grooming: Set up;Sitting;Wash/dry face Grooming Details (indicate cue type and reason): EOB with patient noted to be quick to fatigue with O2 dropping ot 87% n 3L/min with SOB noted. patient declined to further participate in session and was returned to bed. Upper Body Bathing: Min guard;Sitting   Lower Body Bathing: Moderate assistance;Sit to/from stand;Sitting/lateral leans   Upper Body Dressing : Minimal assistance;Sitting   Lower Body Dressing: Minimal assistance;Sit to/from stand;Sitting/lateral leans   Toilet Transfer: Min Psychiatric nurse Details (indicate cue type and reason): to take one step up [facility] to get in better position in bed. Toileting- Water quality scientist and Hygiene: Min guard;Sit to/from stand  Vision Patient Visual Report: No change from baseline       Perception     Praxis      Pertinent Vitals/Pain Pain Assessment Faces Pain Scale: Hurts a little bit Pain Location: chest from fluid Pain Descriptors / Indicators: Discomfort Pain Intervention(s): Monitored during session     Hand Dominance     Extremity/Trunk  Assessment Upper Extremity Assessment Upper Extremity Assessment: Overall WFL for tasks assessed   Lower Extremity Assessment Lower Extremity Assessment: Defer to PT evaluation RLE Deficits / Details: hx right foot deformity   Cervical / Trunk Assessment Cervical / Trunk Assessment: Normal   Communication Communication Communication: HOH   Cognition Arousal/Alertness: Awake/alert Behavior During Therapy: WFL for tasks assessed/performed Overall Cognitive Status: Within Functional Limits for tasks assessed                                 General Comments: patient was down on herself with recent lung cancer diagnosis with patient reporting 3 months if no treatment and up to 12 with chemo. patient was educated on chaplin services. patient verbalized understanding but declined at this time. nurse made aware     General Comments       Exercises     Shoulder Instructions      Home Living Family/patient expects to be discharged to:: Private residence Living Arrangements: Alone Available Help at Discharge: Family;Available 24 hours/day Type of Home: House Home Access: Stairs to enter [employer] of Steps: 3 Entrance Stairs-Rails: Right;Left;Can reach both Home Layout: One level               Home Equipment: Conservation officer, nature (2 wheels);BSC/3in1;Cane - single point          Prior Functioning/Environment Prior Level of Function : Independent/Modified Independent                        OT Problem List: Decreased activity tolerance;Impaired balance (sitting and/or standing);Decreased safety awareness;Cardiopulmonary status limiting activity;Decreased knowledge of precautions;Decreased knowledge of use of DME or AE      OT Treatment/Interventions: Self-care/ADL training;Therapeutic exercise;Neuromuscular education;Energy conservation;DME and/or AE instruction;Therapeutic activities;Balance training;Patient/family education    OT  Goals(Current goals can be found in the care plan section) Acute Rehab OT Goals Patient Stated Goal: to get fluid pulled off today OT Goal Formulation: With patient Time For Goal Achievement: 07/29/22 Potential to Achieve Goals: Good  OT Frequency: Min 2X/week    Co-evaluation              AM-PAC OT "6 Clicks" Daily Activity     Outcome Measure Help from another person eating meals?: Total (NPO) Help from another person taking care of personal grooming?: A Little Help from another person toileting, which includes using toliet, bedpan, or urinal?: A Lot Help from another person bathing (including washing, rinsing, drying)?: A Lot Help from another person to put on and taking off regular upper body clothing?: A Little Help from another person to put on and taking off regular lower body clothing?: A Lot 6 Click Score: 13   End of Session Nurse Communication: Mobility status  Activity Tolerance: Patient limited by fatigue Patient left: in bed;with call bell/phone within reach  OT Visit Diagnosis: Unsteadiness on feet (R26.81);Other abnormalities of gait and mobility (R26.89);Muscle weakness (generalized) (M62.81)                Time: 9622-2979 OT Time Calculation (  min): 18 min Charges:  OT General Charges $OT Visit: 1 Visit OT Evaluation $OT Eval Moderate Complexity: 1 Mod  Melody Casey OTR/L, MS Acute Rehabilitation Department Office# [phone] Pager# [phone]   Marcellina Millin 07/15/2022, 10:06 AM

## 2022-07-16 ENCOUNTER — Encounter (HOSPITAL_COMMUNITY): Payer: Self-pay | Admitting: Internal Medicine

## 2022-07-16 ENCOUNTER — Inpatient Hospital Stay (HOSPITAL_COMMUNITY): Payer: Medicare Other

## 2022-07-16 ENCOUNTER — Ambulatory Visit
Admit: 2022-07-16 | Discharge: 2022-07-16 | Disposition: A | Discharge status: Home / Self Care | Payer: Medicare Other | Attending: Radiation Oncology | Admitting: Radiation Oncology

## 2022-07-16 ENCOUNTER — Ambulatory Visit: Payer: Medicare Other | Admitting: Family Medicine

## 2022-07-16 DIAGNOSIS — Z7189 Other specified counseling: Secondary | ICD-10-CM | POA: Diagnosis not present

## 2022-07-16 DIAGNOSIS — I10 Essential (primary) hypertension: Secondary | ICD-10-CM | POA: Diagnosis not present

## 2022-07-16 DIAGNOSIS — J9601 Acute respiratory failure with hypoxia: Secondary | ICD-10-CM | POA: Diagnosis not present

## 2022-07-16 DIAGNOSIS — C50411 Malignant neoplasm of upper-outer quadrant of right female breast: Secondary | ICD-10-CM

## 2022-07-16 DIAGNOSIS — Z515 Encounter for palliative care: Secondary | ICD-10-CM

## 2022-07-16 DIAGNOSIS — I2510 Atherosclerotic heart disease of native coronary artery without angina pectoris: Secondary | ICD-10-CM | POA: Diagnosis not present

## 2022-07-16 DIAGNOSIS — Z8739 Personal history of other diseases of the musculoskeletal system and connective tissue: Secondary | ICD-10-CM

## 2022-07-16 DIAGNOSIS — C3432 Malignant neoplasm of lower lobe, left bronchus or lung: Secondary | ICD-10-CM | POA: Diagnosis not present

## 2022-07-16 DIAGNOSIS — C3490 Malignant neoplasm of unspecified part of unspecified bronchus or lung: Secondary | ICD-10-CM

## 2022-07-16 DIAGNOSIS — J90 Pleural effusion, not elsewhere classified: Secondary | ICD-10-CM | POA: Diagnosis not present

## 2022-07-16 DIAGNOSIS — M05741 Rheumatoid arthritis with rheumatoid factor of right hand without organ or systems involvement: Secondary | ICD-10-CM

## 2022-07-16 DIAGNOSIS — Z170 Estrogen receptor positive status [ER+]: Secondary | ICD-10-CM

## 2022-07-16 LAB — COMPREHENSIVE METABOLIC PANEL
ALT: 18 U/L (ref 0–44)
AST: 13 U/L — ABNORMAL LOW (ref 15–41)
Albumin: 2.4 g/dL — ABNORMAL LOW (ref 3.5–5.0)
Alkaline Phosphatase: 66 U/L (ref 38–126)
Anion gap: 5 (ref 5–15)
BUN: 38 mg/dL — ABNORMAL HIGH (ref 8–23)
CO2: 23 mmol/L (ref 22–32)
Calcium: 9.2 mg/dL (ref 8.9–10.3)
Chloride: 106 mmol/L (ref 98–111)
Creatinine, Ser: 0.98 mg/dL (ref 0.44–1.00)
GFR, Estimated: 59 mL/min — ABNORMAL LOW (ref >60)
Glucose, Bld: 121 mg/dL — ABNORMAL HIGH (ref 70–99)
Potassium: 4.5 mmol/L (ref 3.5–5.1)
Sodium: 134 mmol/L — ABNORMAL LOW (ref 135–145)
Total Bilirubin: 0.6 mg/dL (ref 0.3–1.2)
Total Protein: 5.3 g/dL — ABNORMAL LOW (ref 6.5–8.1)

## 2022-07-16 LAB — CBC
HCT: 33.5 % — ABNORMAL LOW (ref 36.0–46.0)
Hemoglobin: 10.9 g/dL — ABNORMAL LOW (ref 12.0–15.0)
MCH: 28.4 pg (ref 26.0–34.0)
MCHC: 32.5 g/dL (ref 30.0–36.0)
MCV: 87.2 fL (ref 80.0–100.0)
Platelets: 293 10*3/uL (ref 150–400)
RBC: 3.84 MIL/uL — ABNORMAL LOW (ref 3.87–5.11)
RDW: 15.2 % (ref 11.5–15.5)
WBC: 9.8 10*3/uL (ref 4.0–10.5)
nRBC: 0 % (ref 0.0–0.2)

## 2022-07-16 LAB — GLUCOSE, CAPILLARY
Glucose-Capillary: 125 mg/dL — ABNORMAL HIGH (ref 70–99)
Glucose-Capillary: 134 mg/dL — ABNORMAL HIGH (ref 70–99)
Glucose-Capillary: 119 mg/dL — ABNORMAL HIGH (ref 70–99)
Glucose-Capillary: 151 mg/dL — ABNORMAL HIGH (ref 70–99)
Glucose-Capillary: 133 mg/dL — ABNORMAL HIGH (ref 70–99)
Glucose-Capillary: 133 mg/dL — ABNORMAL HIGH (ref 70–99)

## 2022-07-16 MED ORDER — MORPHINE SULFATE (PF) 2 MG/ML IV SOLN
1.0000 mg | INTRAVENOUS | Status: DC | PRN
  Administered 2022-07-16 – 2022-07-19 (×7): 1 mg via INTRAVENOUS
  Filled 2022-07-16 (×7): qty 1

## 2022-07-16 MED ORDER — ADULT MULTIVITAMIN W/MINERALS CH
1.0000 | ORAL_TABLET | Freq: Every day | ORAL | Status: DC
  Administered 2022-07-16 – 2022-07-20 (×5): 1 via ORAL
  Filled 2022-07-16 (×5): qty 1

## 2022-07-16 MED ORDER — ENSURE ENLIVE PO LIQD
237.0000 mL | Freq: Three times a day (TID) | ORAL | Status: DC
  Administered 2022-07-16 – 2022-07-18 (×4): 237 mL via ORAL

## 2022-07-16 MED ORDER — MELATONIN 5 MG PO TABS
5.0000 mg | ORAL_TABLET | Freq: Once | ORAL | Status: AC
  Administered 2022-07-16: 5 mg via ORAL
  Filled 2022-07-16: qty 1

## 2022-07-16 NOTE — TOC Progression Note (Addendum)
Transition of Care [facility]) - Progression Note    Patient Details  Name: Melody Casey MRN: [id number] Date of Birth: 07/06/1946  Transition of Care [facility]) CM/SW [place], RN Phone Number: 07/16/2022, 10:30 AM  Clinical Narrative:   RNCM received request for pleurx cath placement benefit information. This will fall under patient's Medicare Part B benefit at 80%.   TOC will continue to follow as needs arise.         Expected Discharge Plan and Services     Discharge Planning Services: CM Consult   Living arrangements for the past 2 months: Single Family Home                                       Social Determinants of Health (SDOH) Interventions    Readmission Risk Interventions     No data to display

## 2022-07-16 NOTE — Consult Note (Signed)
   [facility] Acute Care CM Inpatient Consult   07/16/2022  [name] 10-22-1946 [id number]  [facility] [ACO] Patient: Medicare ACO REACH  *Remote coverage review, alert from referral patient admitted to [facility]  **Alert of admission from [facility] for question regarding 'got discharge instructions."  Primary Care Provider:  Dettinger, [name], MD [place] is an embedded provider with a Chronic Care Management team and program, and is listed for the transition of care follow up and appointments.  Electronic Medical Record [EMR] review for patient high risk score for unplanned readmission risk for readmission prevention screen.  Patient with ongoing medical work up reviewed and noted  Plan: Continue to monitor for post hospital follow up needs for care coordination.  A referral request can be sent to the [facility] Management team for follow up pending disposition and needs. Please contact for further questions,  Natividad Brood, RN BSN [facility] Liaison  [phone] business mobile phone Toll free office [phone]  Fax number: [phone] [email or web address]

## 2022-07-16 NOTE — Consult Note (Signed)
Radiation Oncology         [phone] ________________________________  Name: Melody Casey        MRN: [id number]  Date of Service: 07/16/22   DOB: 1946/11/08  [id number], [name], MD    REFERRING PHYSICIAN: Dr. Julien Nordmann   DIAGNOSIS: The primary encounter diagnosis was Pleural effusion. 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   HISTORY OF PRESENT ILLNESS: Melody Casey is a 76 y.o. female seen at the request of Dr. 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  The patient has a history of Stage IA, pT1aN0M0, ER/PR positive  right breast cancer status post right lumpectomy and sentinel lymph node biopsy, adjuvant antiestrogen therapy with no radiation treatment.  She completed antiestrogen therapy in 2021.  More recently however she presented to the hospital last month with progressive shortness of breath unintended weight changes, cough and was found to have a right pleural effusion, bulky mediastinal adenopathy, pleural-based metastases, suspected for right adrenal metastasis and a large 5.6 cm right lower lobe mass with associated right middle and right lower lobe atelectasis/collapse.  She underwent thoracentesis on several occasions, the cytology has been negative thus far.  She did undergo b ronchoscopy on 07/06/2022 showing small cell carcinoma in the 4R node, station 7 node, endobronchial brushings from the right lower lobe and right upper lobe as well as the right lower lobe biopsy.  She met with Dr. Julien Nordmann prior to her admission and he had offered systemic chemotherapy and palliative radiotherapy referral.  She also had an MRI of the brain on 07/05/2022 that was negative for  metastatic disease.  She was admitted however on 07/14/2022 with persistent shortness of breath.  She was found on chest x-ray to have an increase in the right pleural effusion.  She has plans to undergo Pleurx catheter placement this afternoon.  We have been asked to see the patient to consider palliative radiotherapy to the chest.  Of note based on notes it appears that she has been able to tolerate breathing on room air without oxygen however also has required up to 3 L in the past few days.   PREVIOUS RADIATION THERAPY: No   PAST MEDICAL HISTORY:  Past Medical History:  Diagnosis Date   AAA (abdominal aortic aneurysm) ([place])    Acute diverticulitis    Anxiety    Arthritis    Atrial fibrillation ([place])    a. Dx 03/2011 - on tikosyn/coumadin.   CAD (coronary artery disease)    a. NSTEMI 02/2011: occ mid Cx, DES to OM2, residual nonobst LAD dz.   Cancer [facility])    breast   Diabetes mellitus    Diverticulitis    Diverticulitis of colon     2008, 04/2011, 12/2014, 08/2015   Dysrhythmia    Foot deformity 1947   right; ??scleroderma   Hemangioma    liver   HLD (hyperlipidemia)    HTN (hypertension)    Osteoporosis    Tobacco abuse    stopped smoking 2012   Ureteral obstruction    History of gross hematuria/right hydronephrosis 2/2 to uteropelvic junction obstruction, s/p cystoscopy in January 2007 with bilateral retrograde pyelography, right ureter arthroscopy, right ureteral stent placement, bladder  biopsies, stent removal since then.   Wears dentures    top    Scleroderma and Rheumatoid Arthritis are note listed in her PMHx but listed in her problem list.   PAST SURGICAL HISTORY: Past Surgical History:  Procedure Laterality Date   BIOPSY N/A 11/01/2015   Procedure: BIOPSY;  Surgeon: Danie Binder, MD;  Location: [facility];  Service: Endoscopy;  Laterality: N/A;   BIOPSY  07/06/2022   Procedure: BIOPSY;  Surgeon: Collene Gobble, MD;  [facility];  Service: Cardiopulmonary;;    BREAST LUMPECTOMY WITH NEEDLE LOCALIZATION AND AXILLARY SENTINEL LYMPH NODE BX Right 03/01/2014   Procedure: BREAST LUMPECTOMY WITH NEEDLE LOCALIZATION AND AXILLARY SENTINEL LYMPH NODE BX;  Surgeon: Edward Jolly, MD;  Location: [place];  Service: General;  Laterality: Right;   BREAST SURGERY     BRONCHIAL BRUSHINGS  07/06/2022   Procedure: BRONCHIAL BRUSHINGS;  Surgeon: Collene Gobble, MD;  [facility];  Service: Cardiopulmonary;;   CARDIAC CATHETERIZATION  2012   stent   COLON RESECTION N/A 07/25/2018   Procedure: COLOSTOMY;  Surgeon: Rolm Bookbinder, MD;  Location: [place];  Service: General;  Laterality: N/A;   COLONOSCOPY WITH PROPOFOL N/A 06/29/2014   Dr. Wynetta Emery: universal diverticulosis   CORONARY STENT PLACEMENT  2012   ENDOBRONCHIAL ULTRASOUND Bilateral 07/06/2022   Procedure: ENDOBRONCHIAL ULTRASOUND;  Surgeon: Collene Gobble, MD;  [facility];  Service: Cardiopulmonary;  Laterality: Bilateral;   ESOPHAGOGASTRODUODENOSCOPY (EGD) WITH PROPOFOL N/A 11/01/2015   Procedure: ESOPHAGOGASTRODUODENOSCOPY (EGD) WITH PROPOFOL;  Surgeon: Danie Binder, MD;  Location[facility];  Service: Endoscopy;  Laterality: N/A;   FINE NEEDLE ASPIRATION  07/06/2022   Procedure: FINE NEEDLE ASPIRATION (FNA) LINEAR;  Surgeon: Collene Gobble, MD;  [facility];  Service: Cardiopulmonary;;   KNEE ARTHROSCOPY     left   LAPAROTOMY N/A 07/25/2018   Procedure: EXPLORATORY LAPAROTOMY;  Surgeon: Rolm Bookbinder, MD;  Location: [place];  Service: General;  Laterality: N/A;   PARTIAL COLECTOMY N/A 07/25/2018   Procedure: COLECTOMY;  Surgeon: Rolm Bookbinder, MD;  Location: [place];  Service: General;  Laterality: N/A;   Right leg surgery  age 47   As a child for  ?scleroderma per patient   TONSILLECTOMY AND ADENOIDECTOMY     TOTAL KNEE ARTHROPLASTY Left 01/01/2022   Procedure: LEFT TOTAL KNEE ARTHROPLASTY;  Surgeon: Frederik Pear, MD;  [facility];   Service: Orthopedics;  Laterality: Left;   TUBAL LIGATION     Ureteral surgery  2011   rt ureterostomy-stent   VIDEO BRONCHOSCOPY N/A 07/06/2022   Procedure: VIDEO BRONCHOSCOPY WITHOUT FLUORO;  Surgeon: Collene Gobble, MD;  [facility];  Service: Cardiopulmonary;  Laterality: N/A;     FAMILY HISTORY:  Family History  Problem Relation Age of Onset   Lung cancer Father 69       deceased   Cancer Father    Heart failure Mother 39       deceased   Heart disease Mother    Diabetes Brother    Hypertension Brother    Cancer Brother        prostate   Colon cancer Neg Hx    Liver disease Neg Hx      SOCIAL HISTORY:  reports that she quit smoking about 11 years ago. Her smoking use included cigarettes. She has a 100.00 pack-year smoking history. She has never used smokeless tobacco. She reports that she does not drink alcohol and does  not use drugs. The patient lives in [place] and is recently widowed. Her daughter Ms. Stann Mainland is her 30 and assists her mother in medical decision making.    ALLERGIES: Miralax [polyethylene glycol], Vancomycin, Acetaminophen, Banana, Oxycodone-acetaminophen, Sulfa antibiotics, Sulfacetamide sodium, Sulfasalazine, Cefepime, Latex, Oxycodone-acetaminophen, Sulfacetamide sodium, Sulfacetamide sodium-sulfur, and Tape   MEDICATIONS:  Current Facility-Administered Medications  Medication Dose Route Frequency Provider Last Rate Last Admin   0.9 %  sodium chloride infusion  250 mL Intravenous PRN Doutova, Anastassia, MD       albuterol (PROVENTIL) (2.5 MG/3ML) 0.083% nebulizer solution 2.5 mg  2.5 mg Nebulization Q4H PRN Doutova, Anastassia, MD   2.5 mg at 07/15/22 0337   ALPRAZolam (XANAX) tablet 0.25 mg  0.25 mg Oral TID PRN Toy Baker, MD   0.25 mg at 07/16/22 1105   atorvastatin (LIPITOR) tablet 60 mg  60 mg Oral Daily Doutova, Anastassia, MD   60 mg at 07/15/22 2212   dofetilide (TIKOSYN) capsule 250 mcg  250 mcg Oral BID Toy Baker, MD   250 mcg at 07/16/22 0938   furosemide (LASIX) tablet 40 mg  40 mg Oral Daily Doutova, Anastassia, MD   40 mg at 07/16/22 0938   insulin aspart (novoLOG) injection 0-9 Units  0-9 Units Subcutaneous Q4H Doutova, Anastassia, MD   1 Units at 07/16/22 0629   magnesium oxide (MAG-OX) tablet 400 mg  400 mg Oral BID Toy Baker, MD   400 mg at 07/16/22 5784   metoprolol succinate (TOPROL-XL) 24 hr tablet 25 mg  25 mg Oral Daily Doutova, Anastassia, MD   25 mg at 07/15/22 1042   sodium chloride flush (NS) 0.9 % injection 3 mL  3 mL Intravenous Q12H Doutova, Anastassia, MD   3 mL at 07/16/22 0939   sodium chloride flush (NS) 0.9 % injection 3 mL  3 mL Intravenous PRN Doutova, Anastassia, MD       thiamine (VITAMIN B1) tablet 100 mg  100 mg Oral Daily Doutova, Anastassia, MD   100 mg at 07/16/22 6962     REVIEW OF SYSTEMS: On review of systems, the patient has had progressive shortness of breath over a few months that recently worsened. She's lost about 20 pounds in the last few months, and has had persistent cough but no chest pain or hemoptysis. No other complaints are verbalized.      PHYSICAL EXAM:  Wt Readings from Last 3 Encounters:  07/14/22 162 lb 11.2 oz (73.8 kg)  07/13/22 163 lb 12.8 oz (74.3 kg)  07/06/22 167 lb 5.3 oz (75.9 kg)   Temp Readings from Last 3 Encounters:  07/16/22 98.3 F (36.8 C) (Oral)  07/13/22 (!) 97 F (36.1 C)  07/09/22 (!) 97.5 F (36.4 C) (Oral)   BP Readings from Last 3 Encounters:  07/16/22 (!) 118/50  07/13/22 126/86  07/09/22 121/63   Pulse Readings from Last 3 Encounters:  07/16/22 72  07/13/22 83  07/09/22 73   Pain Assessment Pain Score: 0-No pain/10  Unable to assess due to encounter type.    ECOG = 2-3  0 - Asymptomatic (Fully active, able to carry on all predisease activities without restriction)  1 - Symptomatic but completely ambulatory (Restricted in physically strenuous activity but ambulatory and able to  carry out work of a light or sedentary nature. For example, light housework, office work)  2 - Symptomatic, <50% in bed during the day (Ambulatory and capable of all self care but unable to carry out any work activities. Up  and about more than 50% of waking hours)  3 - Symptomatic, >50% in bed, but not bedbound (Capable of only limited self-care, confined to bed or chair 50% or more of waking hours)  4 - Bedbound (Completely disabled. Cannot carry on any self-care. Totally confined to bed or chair)  5 - Death   Eustace Pen MM, Creech RH, Tormey DC, et al. [phone]). "Toxicity and response criteria of the [facility]". [place] Oncol. 5 (6): 649-55    LABORATORY DATA:  Lab Results  Component Value Date   WBC 9.8 07/16/2022   HGB 10.9 (L) 07/16/2022   HCT 33.5 (L) 07/16/2022   MCV 87.2 07/16/2022   PLT 293 07/16/2022   Lab Results  Component Value Date   NA 134 (L) 07/16/2022   K 4.5 07/16/2022   CL 106 07/16/2022   CO2 23 07/16/2022   Lab Results  Component Value Date   ALT 18 07/16/2022   AST 13 (L) 07/16/2022   ALKPHOS 66 07/16/2022   BILITOT 0.6 07/16/2022      RADIOGRAPHY: DG Chest 2 View  Result Date: 07/14/2022 CLINICAL DATA:  Shortness of breath, history of malignant effusion EXAM: CHEST - 2 VIEW COMPARISON:  07/08/2022 FINDINGS: Mild cardiomegaly. Slight interval increase in a large right pleural effusion with increased atelectasis or consolidation of the right lung. The left lung is normally aerated. Osseous structures unremarkable. IMPRESSION: Slight interval increase in a large right pleural effusion with increased atelectasis or consolidation of the right lung. The left lung is normally aerated. Electronically Signed   By: Delanna Ahmadi M.D.   On: 07/14/2022 17:03   DG CHEST PORT 1 VIEW  Result Date: 07/08/2022 CLINICAL DATA:  76 year old female presents for a value to a shin of RIGHT lower lobe mass. EXAM: PORTABLE CHEST 1 VIEW COMPARISON:   July 05, 2022 in July 03, 2022 FINDINGS: Trachea midline. Cardiomediastinal contours and hilar structures are stable though RIGHT heart border and hilum is obscured at again without change due to masslike area in the RIGHT lower chest better visualized on previous CT likely associated with small RIGHT-sided pleural effusion. No pneumothorax. LEFT chest is clear. On limited assessment no acute skeletal findings. IMPRESSION: 1. Masslike area in the RIGHT lower chest likely associated with small RIGHT-sided pleural effusion. Findings remain highly suspicious for neoplasm and or better assessed on recent chest CT. 2. No change in the appearance of the chest over the short interval. Electronically Signed   By: Zetta Bills M.D.   On: 07/08/2022 08:29   DG CHEST PORT 1 VIEW  Result Date: 07/05/2022 CLINICAL DATA:  Right pleural effusion EXAM: PORTABLE CHEST 1 VIEW COMPARISON:  Chest x-ray dated June 03, 2022 FINDINGS: Visualized cardiac and mediastinal contours are unchanged. Right hemithorax volume loss, similar to prior. Increased opacification of the right hemithorax, likely due to increased pleural effusion or atelectasis. No evidence of pneumothorax. IMPRESSION: Increased opacification of the right hemithorax, likely due to increased pleural effusion or atelectasis. Electronically Signed   By: Yetta Glassman M.D.   On: 07/05/2022 08:14   MR BRAIN W WO CONTRAST  Result Date: 07/05/2022 CLINICAL DATA:  76 year old female with history of breast cancer, possible right lung mass. Staging. EXAM: MRI HEAD WITHOUT AND WITH CONTRAST TECHNIQUE: Multiplanar, multiecho pulse sequences of the brain and surrounding structures were obtained without and with intravenous contrast. CONTRAST:  5m GADAVIST GADOBUTROL 1 MMOL/ML IV SOLN COMPARISON:  Head CT 08/13/2015. FINDINGS: Brain: Mild motion artifact, including  on postcontrast images. No restricted diffusion to suggest acute infarction. No midline shift, mass effect,  evidence of mass lesion, ventriculomegaly, extra-axial collection or acute intracranial hemorrhage. Cervicomedullary junction and pituitary are within normal limits. No abnormal enhancement identified.  No dural thickening Some cerebral volume loss since 2016. Patchy, widely scattered bilateral cerebral white matter T2 and FLAIR hyperintensity. Similar signal heterogeneity in the pons. And a pontine chronic microhemorrhage is evident on series 9, image 15. No other chronic cerebral blood products identified. No definite cortical encephalomalacia. Deep gray matter nuclei and cerebellum appear spared. Vascular: Major intracranial vascular flow voids are preserved, the distal right vertebral artery appears dominant. The major dural venous sinuses are enhancing and appear to be patent. Skull and upper cervical spine: Prominent arachnoid granulation in the midline occipital bone (normal variant series 8, image 14 and series 7, image 12. No destructive bone lesion identified. Negative for age visible cervical spine. Sinuses/Orbits: Orbits appear negative aside from postoperative changes to both globes. Paranasal sinuses and mastoids are clear. Other: Visible internal auditory structures appear normal. Negative visible scalp and face. IMPRESSION: 1. Mildly motion degraded exam. No metastatic disease or acute intracranial abnormality identified. 2. Moderate for age signal changes in the cerebral white matter and pons, compatible with chronic small vessel disease. Electronically Signed   By: Genevie Ann M.D.   On: 07/05/2022 07:29   US THORACENTESIS ASP PLEURAL SPACE W/IMG GUIDE  Result Date: 07/04/2022 INDICATION: Pleural effusion, history of breast cancer, shortness of breath. Request for diagnostic and therapeutic right thoracentesis. EXAM: ULTRASOUND GUIDED DIAGNOSTIC AND THERAPEUTIC RIGHT THORACENTESIS MEDICATIONS: 10 mL 1 % lidocaine COMPLICATIONS: None immediate. PROCEDURE: An ultrasound guided thoracentesis was  thoroughly discussed with the patient and questions answered. The benefits, risks, alternatives and complications were also discussed. The patient understands and wishes to proceed with the procedure. Written consent was obtained. Ultrasound was performed to localize and mark an adequate pocket of fluid in the right chest. The area was then prepped and draped in the normal sterile fashion. 1% Lidocaine was used for local anesthesia. Under ultrasound guidance a 6 Fr Safe-T-Centesis catheter was introduced. Thoracentesis was performed. The catheter was removed and a dressing applied. FINDINGS: A total of approximately 2.5 L of serosanguineous fluid was removed. Samples were sent to the laboratory as requested by the clinical team. IMPRESSION: Successful ultrasound guided right thoracentesis yielding 2.5 L of pleural fluid. Read by: Narda Rutherford, AGNP-BC Electronically Signed   By: Miachel Roux M.D.   On: 07/04/2022 07:35   CT CHEST WO CONTRAST  Result Date: 07/03/2022 CLINICAL DATA:  Pleural effusion.  History of breast cancer. EXAM: CT CHEST WITHOUT CONTRAST TECHNIQUE: Multidetector CT imaging of the chest was performed following the standard protocol without IV contrast. RADIATION DOSE REDUCTION: This exam was performed according to the departmental dose-optimization program which includes automated exposure control, adjustment of the mA and/or kV according to patient size and/or use of iterative reconstruction technique. COMPARISON:  None Available. FINDINGS: Cardiovascular: Heart is normal in size. Small volume pericardial effusion. No evidence of thoracic aortic aneurysm. Atherosclerotic calcifications of the aortic arch. Coronary atherosclerosis of the LAD and left circumflex. Mediastinum/Nodes: Bulky mediastinal lymphadenopathy, including a 2.8 cm short axis right paratracheal node (series 2/image 85) and a 4.5 cm subcarinal nodal mass. Tumor likely extends to the right hilar region, although this is poorly  evaluated on unenhanced CT. Visualized thyroid is unremarkable. Lungs/Pleura: Endobronchial soft tissue occluding the right lower lobe bronchus (series 2/image 33). Associated masslike  right lower lobe opacity, likely reflecting a combination of central tumor and postobstructive atelectasis/collapse, although ill-defined on unenhanced CT. A central tumor component of 5.6 cm is suspected (series 2/image 87). Complete right middle lobe atelectasis/collapse. Trace residual right pleural effusion. Associated pleural-based nodularity (series 2/images 50, 72, and 75), reflecting pleural metastases. 6 mm right upper lobe nodule (series 5/image 69), indeterminate. Left lung is clear. Mild centrilobular emphysematous changes. No pneumothorax. Upper Abdomen: Visualized upper abdomen is notable for 12 mm right adrenal nodule (series 2/image 150), new from 2020 CT abdomen/pelvis, suspicious for metastasis. Vascular calcifications. Musculoskeletal: Mild degenerative changes of the lower thoracic spine. IMPRESSION: Suspected large right lower lobe mass, poorly evaluated on unenhanced CT, with associated right middle and lower lobe atelectasis/collapse. This appearance is suspicious for primary bronchogenic neoplasm. Bulky mediastinal nodal metastases. Residual small right pleural effusion with pleural-based metastases. Suspected small right adrenal metastasis. Aortic Atherosclerosis (ICD10-I70.0) and Emphysema (ICD10-J43.9). Electronically Signed   By: Julian Hy M.D.   On: 07/03/2022 19:34   DG Chest Port 1 View  Result Date: 07/03/2022 CLINICAL DATA:  Post right thoracentesis. EXAM: PORTABLE CHEST 1 VIEW COMPARISON:  07/02/2022 FINDINGS: Examination demonstrates interval improvement in patient's right-sided pleural effusion with residual small to moderate effusion present. No pneumothorax. Left lung unchanged. Cardiomediastinal silhouette and remainder of the exam is unchanged. IMPRESSION: Significant interval  improvement in patient's right pleural effusion post thoracentesis. No pneumothorax. Electronically Signed   By: Marin Olp M.D.   On: 07/03/2022 16:37   DG CHEST PORT 1 VIEW  Result Date: 07/02/2022 CLINICAL DATA:  Pleural effusion EXAM: PORTABLE CHEST 1 VIEW COMPARISON:  06/30/2022 FINDINGS: Stable heart size. Aortic atherosclerosis. Moderate-large right pleural effusion with associated right basilar opacity, similar to prior. Left lung appears clear. No pneumothorax. IMPRESSION: Moderate-large right pleural effusion with associated right basilar opacity, similar to prior. Electronically Signed   By: Davina Poke D.O.   On: 07/02/2022 14:12   US THORACENTESIS ASP PLEURAL SPACE W/IMG GUIDE  Result Date: 06/30/2022 INDICATION: Pleural effusion, history of breast cancer, shortness of breath EXAM: ULTRASOUND GUIDED RIGHT THORACENTESIS MEDICATIONS: None. COMPLICATIONS: None immediate. PROCEDURE: An ultrasound guided thoracentesis was thoroughly discussed with the patient and questions answered. The benefits, risks, alternatives and complications were also discussed. The patient understands and wishes to proceed with the procedure. Written consent was obtained. Ultrasound was performed to localize and mark an adequate pocket of fluid in the right chest. The area was then prepped and draped in the normal sterile fashion. 1% Lidocaine was used for local anesthesia. Under ultrasound guidance a 6 Fr Safe-T-Centesis catheter was introduced. Thoracentesis was performed. The catheter was removed and a dressing applied. FINDINGS: A total of approximately 1.3L of red pleural fluid was removed. Samples were sent to the laboratory as requested by the clinical team. IMPRESSION: Successful ultrasound guided right thoracentesis yielding 1.3L of pleural fluid. Performed and dictated by Pasty Spillers, PA-C Electronically Signed   By: Markus Daft M.D.   On: 06/30/2022 21:31   DG CHEST PORT 1 VIEW  Result Date:  06/30/2022 CLINICAL DATA:  Post right-sided thoracentesis. EXAM: PORTABLE CHEST 1 VIEW COMPARISON:  06/29/2022 FINDINGS: Examination demonstrates a moderate to large right pleural effusion with mild interval improvement post thoracentesis. No right-sided pneumothorax. Left lung is clear. Mild stable cardiomegaly. Remainder of the exam is unchanged. IMPRESSION: 1. Moderate to large right pleural effusion with mild interval improvement post thoracentesis. No pneumothorax. 2. Mild stable cardiomegaly. Electronically Signed   By: Marin Olp  M.D.   On: 06/30/2022 13:15   DG Chest 2 View  Result Date: 06/29/2022 CLINICAL DATA:  Dyspnea, pleural effusion EXAM: CHEST - 2 VIEW COMPARISON:  Radiographs 06/25/2022 FINDINGS: Since 06/25/2022, slightly increased moderate-large right pleural effusion and associated atelectasis. Remainder unchanged. Left basilar atelectasis. Chronic interstitial coarsening. Aortic calcification. Unchanged cardiomediastinal silhouette. No acute osseous abnormality. IMPRESSION: Increased moderate-large right pleural effusion. Electronically Signed   By: Placido Sou M.D.   On: 06/29/2022 15:36   DG Chest 2 View  Result Date: 06/25/2022 CLINICAL DATA:  Shortness of breath.  Atrial fibrillation. EXAM: CHEST - 2 VIEW COMPARISON:  05/02/2022 FINDINGS: Moderate to large right pleural effusion occupies the lower 1/2 of right hemithorax. This is new from prior exam. Suspect associated atelectasis with basilar volume loss. Grossly stable heart size and mediastinal contours, aortic atherosclerosis. Chronic interstitial coarsening. No pneumothorax. No acute osseous findings. IMPRESSION: Moderate to large right pleural effusion, new from prior exam. Electronically Signed   By: Keith Rake M.D.   On: 06/25/2022 19:22       IMPRESSION/PLAN: 1. Extensive Stage Small Cell Carcinoma of the right lung with pleural effusion, and concerns for adrenal metastasis. I spent time by phone with the  patient's daughter discussing the imaging findings and  the nature of small cell lung cancer. She has met with Dr. Julien Nordmann and he offered systemic chemotherapy. She has been contemplating relocating to [place] as well, and was being referred as an outpatient to our clinic. We discussed the use of palliative radiotherapy to the chest, and the course of 10 fractions. We also discussed that radiation treatment plans are not considered portable, and if she would like to start radiation in the near future, we'd recommend completing the course here in our center prior to relocating. Since they do not have immediate plans for relocating her care, she is open to considering radiation treatment here in our center. We also discussed that ideally her anatomy needs to remain as stable as possible, and that having the pleurex catheter placed prior to simulation or radiation beginning would be very helpful. She is scheduled for this procedure today. She would like to meet back with Dr. Julien Nordmann prior to making a decision however of starting radiation. We discussed the risks, benefits, short, and long term effects of radiotherapy, as well as the palliative intent as well as the course of 2 weeks of radiotherapy to the chest. Her connective tissue diagnosis also plays a role in the use of radiation. Dr. Lisbeth Renshaw has not had a chance to weigh in on this, but historically radiation is held from the treatment plan for patients with Scleroderma. This will need to be considered as well. However, we will hold space for simulation tomorrow, if indeed the plan is to move forward.  2. Scleroderma and RA. The patient's history of these conditions could limit use of radiation, but we will discuss in more detail when Dr. Lisbeth Renshaw has had a chance to weigh in.    In a visit lasting 60 minutes, greater than 50% of the time was spent by phone and in floor time discussing the patient's condition, in preparation for the discussion, and coordinating  the patient's care.       Carola Rhine, [facility]   **Disclaimer: This note was dictated with voice recognition software. Similar sounding words can inadvertently be transcribed and this note may contain transcription errors which may not have been corrected upon publication of note.**

## 2022-07-16 NOTE — Progress Notes (Signed)
Initial Nutrition Assessment  DOCUMENTATION CODES:   Not applicable  INTERVENTION:  - will liberalize diet from Carb Modified to Regular.  - will order Ensure Plus High Protein TID, each supplement provides 350 kcal and 20 grams of protein.  - will order 1 tablet multivitamin with minerals/day.  - added High Calorie, High Protein handout to AVS.   NUTRITION DIAGNOSIS:   Increased nutrient needs related to acute illness, chronic illness as evidenced by estimated needs.  GOAL:   Patient will meet greater than or equal to 90% of their needs  MONITOR:   PO intake, Supplement acceptance, Labs, Weight trends  REASON FOR ASSESSMENT:   Malnutrition Screening Tool, Consult Assessment of nutrition requirement/status  ASSESSMENT:   76 y.o. 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 She presented to the ED due to worsening shortness of breath. She had thoracentesis on 7/25 with 2.5 L removed at that time. She was admitted for pleural effusion.  Patient laying in bed with eyes closed throughout RD visit. Her daughter was at bedside. RD introduced self and role. Daughter shares that conversations are ongoing about whether to pursue treatment for lung cancer.  Daughter shares that patient has had a poor appetite and that family encourages her, provides her with foods that she requests and enjoys, and encourages Ensure when intakes are minimal.   Daughter denies any nutrition-related questions or needs at that time though shares if patient decides to pursue treatment then family is interested in nutrition-related assistance to aid in adequate nutrition through treatment and in combating side effects.   Shared with daughter that inpatient RDs are more than happy to assist or if decision is made  after d/c, that RDs at the [facility] will be available.   Weight on 8/5 was 163 lb and weight on 6/19 was 168 lb. This indicates 5 lb weight loss (3% body weight); not significant for time frame. Weight on 5/23 was 175 lb which indicates 12 lb weight loss (6.8% body weight) in the past 2.5 months; significant for time frame.    Labs reviewed; CBGs: 125, 134, 119, 151 mg/dl, Na: 134 mmol/l, BUN: 38 mg/dl, GFR: 59 ml/min.   Medications reviewed; 40 mg oral lasix/day, sliding scale novolog, 400 mg mag-ox BID, 100 mg oral thiamine/day.    NUTRITION - FOCUSED PHYSICAL EXAM:  Patient resting; deferred.   Diet Order:   Diet Order             Diet regular Room service appropriate? 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  Estimated Nutritional Needs:  Kcal:  1800-2000 kcal Protein:  90-105 grams Fluid:  >/= 1.5 L/day     Jarome Matin, MS, RD, LDN, CNSC Registered  Dietitian II Inpatient Clinical Nutrition RD pager # and on-call/weekend pager # available in [place]

## 2022-07-16 NOTE — Progress Notes (Addendum)
HEMATOLOGY-ONCOLOGY PROGRESS NOTE  ASSESSMENT AND PLAN: This is a very pleasant 76 years old white female recently diagnosed with extensive stage (T3, N2, M1b) small cell lung cancer presented with large obstructive right lower lobe lung mass in addition to other pulmonary nodules and right hilar and mediastinal lymphadenopathy and suspicious right adrenal metastasis diagnosed in July 2023.  We again reviewed the discussion from their initial visit at the cancer center on 07/13/2022.  Recommend additional work-up including a PET scan to rule out other metastatic disease.  Explained to the patient and her daughter that this will need to be done as an outpatient.  We discussed the incurable nature of her disease and that treatment will be palliative. She understands that without treatment the median overall survival for patient with extensive stage small cell lung cancer may be in the range of 3 months but with the current standard treatment including chemotherapy plus immunotherapy it is currently around 12.5-13 months.  Treatment options will be either palliative care and hospice referral versus consideration of palliative systemic treatment with a combination of carboplatin for AUC of 5 on day 1, etoposide 100 Mg/M2 on days 1, 2 and 3 with Cosela 240 Mg/M2 before the chemotherapy for Myeloprotection as well as Imfinzi 1500 Mg IV on day 1 every 3 weeks initially with the chemotherapy and then every 4 weeks as maintenance if the patient has no evidence of disease progression after cycle #4. Adverse effect of this treatment were again discussed including but not limited to alopecia, myelosuppression, nausea and vomiting, peripheral neuropathy, liver or renal dysfunction as well as immunotherapy adverse effect and risk of infection and even death from complication of the treatment.  The patient is still considering her options.  Palliative care has been consulted to assist with goals of care discussion.   Agree with palliative care consultation.  The patient has been referred to radiation oncology for consideration of palliative radiation to the obstructive right lower lobe lung mass.  Appointment not yet scheduled.  The patient is status post thoracentesis with some improvement in her breathing.  Pulmonology considering placement of Pleurx catheter.  Eliquis is on hold.  Unsure of timing of this procedure.  The family is considering bringing her to [place] where they live so that they can be closer to her.  If her breathing is stable, we have no objection to her traveling.  Our office can assist with referral to an oncologist in [place] if they opt to move her.  Melody Casey  SUBJECTIVE: Melody Casey was seen in initial consultation on 07/13/2022 at the cancer center for her extensive stage small cell lung cancer.  Admitted over the weekend for pleural effusion.  Underwent thoracentesis with 1 L of fluid removed.  Pulmonary following and considering Pleurx catheter placement.  Eliquis on hold.  This morning reports improvement in her breathing.  Daughter at the bedside.  No other new complaints reported this morning.  Oncology History  Primary small cell carcinoma of lower lobe of left lung ([place])  07/13/2022 Initial Diagnosis   Primary small cell carcinoma of lower lobe of left lung ([place])   07/13/2022 Cancer Staging   Staging form: Lung, AJCC 8th Edition - Clinical: Stage IVA (cT3, cN2, cM1b) - Signed by Curt Bears, MD on 07/13/2022      REVIEW OF SYSTEMS:   Review of Systems  Constitutional:  Negative for chills and fever.  HENT: Negative.    Respiratory:  Positive for shortness of breath.  Cardiovascular:  Negative for chest pain.  Gastrointestinal: Negative.   Skin: Negative.   Neurological: Negative.     I have reviewed the past medical history, past surgical history, social history and family history with the patient and they are unchanged from previous  note.   PHYSICAL EXAMINATION: ECOG PERFORMANCE STATUS: 1 - Symptomatic but completely ambulatory  Vitals:   07/16/22 0441 07/16/22 0801  BP: (!) 105/49 (!) 118/50  Pulse: 72 72  Resp: 18 (!) 22  Temp: 98.6 F (37 C) 98.3 F (36.8 C)  SpO2: 93% 90%   Filed Weights   07/14/22 1637 07/14/22 2027  Weight: 73.5 kg 73.8 kg    Intake/Output from previous day: 08/06 0701 - 08/07 0700 In: 373 [P.O.:370; I.V.:3] Out: 850 [Urine:500; Stool:350]  Physical Exam Vitals reviewed.  Constitutional:      General: She is not in acute distress. HENT:     Head: Normocephalic.  Eyes:     General: No scleral icterus. Pulmonary:     Effort: Pulmonary effort is normal. No respiratory distress.  Skin:    General: Skin is warm and dry.  Neurological:     Mental Status: She is alert and oriented to person, place, and time.     LABORATORY DATA:  I have reviewed the data as listed    Latest Ref Rng & Units 07/16/2022    3:20 AM 07/15/2022    4:21 AM 07/14/2022    5:29 PM  CMP  Glucose 70 - 99 mg/dL 121  126  163   BUN 8 - 23 mg/dL 38  38  38   Creatinine 0.44 - 1.00 mg/dL 0.98  0.86  1.05   Sodium 135 - 145 mmol/L 134  134  134   Potassium 3.5 - 5.1 mmol/L 4.5  4.6  4.9   Chloride 98 - 111 mmol/L 106  105  104   CO2 22 - 32 mmol/L 23  23  23    Calcium 8.9 - 10.3 mg/dL 9.2  9.2  9.8   Total Protein 6.5 - 8.1 g/dL 5.3  5.7  6.3   Total Bilirubin 0.3 - 1.2 mg/dL 0.6  0.7  0.6   Alkaline Phos 38 - 126 U/L 66  78  90   AST 15 - 41 U/L 13  16  24    ALT 0 - 44 U/L 18  22  27      Lab Results  Component Value Date   WBC 9.8 07/16/2022   HGB 10.9 (L) 07/16/2022   HCT 33.5 (L) 07/16/2022   MCV 87.2 07/16/2022   PLT 293 07/16/2022   NEUTROABS 8.3 (H) 07/14/2022    No results found for: "CEA1", "CEA", "CAN199", "CA125", "PSA1"  DG Chest 2 View  Result Date: 07/14/2022 CLINICAL DATA:  Shortness of breath, history of malignant effusion EXAM: CHEST - 2 VIEW COMPARISON:  07/08/2022  FINDINGS: Mild cardiomegaly. Slight interval increase in a large right pleural effusion with increased atelectasis or consolidation of the right lung. The left lung is normally aerated. Osseous structures unremarkable. IMPRESSION: Slight interval increase in a large right pleural effusion with increased atelectasis or consolidation of the right lung. The left lung is normally aerated. Electronically Signed   By: Delanna Ahmadi M.D.   On: 07/14/2022 17:03   DG CHEST PORT 1 VIEW  Result Date: 07/08/2022 CLINICAL DATA:  76 year old female presents for a value to a shin of RIGHT lower lobe mass. EXAM: PORTABLE CHEST 1 VIEW COMPARISON:  July 05, 2022 in July 03, 2022 FINDINGS: Trachea midline. Cardiomediastinal contours and hilar structures are stable though RIGHT heart border and hilum is obscured at again without change due to masslike area in the RIGHT lower chest better visualized on previous CT likely associated with small RIGHT-sided pleural effusion. No pneumothorax. LEFT chest is clear. On limited assessment no acute skeletal findings. IMPRESSION: 1. Masslike area in the RIGHT lower chest likely associated with small RIGHT-sided pleural effusion. Findings remain highly suspicious for neoplasm and or better assessed on recent chest CT. 2. No change in the appearance of the chest over the short interval. Electronically Signed   By: Zetta Bills M.D.   On: 07/08/2022 08:29   DG CHEST PORT 1 VIEW  Result Date: 07/05/2022 CLINICAL DATA:  Right pleural effusion EXAM: PORTABLE CHEST 1 VIEW COMPARISON:  Chest x-ray dated June 03, 2022 FINDINGS: Visualized cardiac and mediastinal contours are unchanged. Right hemithorax volume loss, similar to prior. Increased opacification of the right hemithorax, likely due to increased pleural effusion or atelectasis. No evidence of pneumothorax. IMPRESSION: Increased opacification of the right hemithorax, likely due to increased pleural effusion or atelectasis. Electronically  Signed   By: Yetta Glassman M.D.   On: 07/05/2022 08:14   MR BRAIN W WO CONTRAST  Result Date: 07/05/2022 CLINICAL DATA:  76 year old female with history of breast cancer, possible right lung mass. Staging. EXAM: MRI HEAD WITHOUT AND WITH CONTRAST TECHNIQUE: Multiplanar, multiecho pulse sequences of the brain and surrounding structures were obtained without and with intravenous contrast. CONTRAST:  58mL GADAVIST GADOBUTROL 1 MMOL/ML IV SOLN COMPARISON:  Head CT 08/13/2015. FINDINGS: Brain: Mild motion artifact, including on postcontrast images. No restricted diffusion to suggest acute infarction. No midline shift, mass effect, evidence of mass lesion, ventriculomegaly, extra-axial collection or acute intracranial hemorrhage. Cervicomedullary junction and pituitary are within normal limits. No abnormal enhancement identified.  No dural thickening Some cerebral volume loss since 2016. Patchy, widely scattered bilateral cerebral white matter T2 and FLAIR hyperintensity. Similar signal heterogeneity in the pons. And a pontine chronic microhemorrhage is evident on series 9, image 15. No other chronic cerebral blood products identified. No definite cortical encephalomalacia. Deep gray matter nuclei and cerebellum appear spared. Vascular: Major intracranial vascular flow voids are preserved, the distal right vertebral artery appears dominant. The major dural venous sinuses are enhancing and appear to be patent. Skull and upper cervical spine: Prominent arachnoid granulation in the midline occipital bone (normal variant series 8, image 14 and series 7, image 12. No destructive bone lesion identified. Negative for age visible cervical spine. Sinuses/Orbits: Orbits appear negative aside from postoperative changes to both globes. Paranasal sinuses and mastoids are clear. Other: Visible internal auditory structures appear normal. Negative visible scalp and face. IMPRESSION: 1. Mildly motion degraded exam. No metastatic  disease or acute intracranial abnormality identified. 2. Moderate for age signal changes in the cerebral white matter and pons, compatible with chronic small vessel disease. Electronically Signed   By: Genevie Ann M.D.   On: 07/05/2022 07:29   US THORACENTESIS ASP PLEURAL SPACE W/IMG GUIDE  Result Date: 07/04/2022 INDICATION: Pleural effusion, history of breast cancer, shortness of breath. Request for diagnostic and therapeutic right thoracentesis. EXAM: ULTRASOUND GUIDED DIAGNOSTIC AND THERAPEUTIC RIGHT THORACENTESIS MEDICATIONS: 10 mL 1 % lidocaine COMPLICATIONS: None immediate. PROCEDURE: An ultrasound guided thoracentesis was thoroughly discussed with the patient and questions answered. The benefits, risks, alternatives and complications were also discussed. The patient understands and wishes to proceed with the procedure. Written consent was  obtained. Ultrasound was performed to localize and mark an adequate pocket of fluid in the right chest. The area was then prepped and draped in the normal sterile fashion. 1% Lidocaine was used for local anesthesia. Under ultrasound guidance a 6 Fr Safe-T-Centesis catheter was introduced. Thoracentesis was performed. The catheter was removed and a dressing applied. FINDINGS: A total of approximately 2.5 L of serosanguineous fluid was removed. Samples were sent to the laboratory as requested by the clinical team. IMPRESSION: Successful ultrasound guided right thoracentesis yielding 2.5 L of pleural fluid. Read by: Narda Rutherford, AGNP-BC Electronically Signed   By: Miachel Roux M.D.   On: 07/04/2022 07:35   CT CHEST WO CONTRAST  Result Date: 07/03/2022 CLINICAL DATA:  Pleural effusion.  History of breast cancer. EXAM: CT CHEST WITHOUT CONTRAST TECHNIQUE: Multidetector CT imaging of the chest was performed following the standard protocol without IV contrast. RADIATION DOSE REDUCTION: This exam was performed according to the departmental dose-optimization program which  includes automated exposure control, adjustment of the mA and/or kV according to patient size and/or use of iterative reconstruction technique. COMPARISON:  None Available. FINDINGS: Cardiovascular: Heart is normal in size. Small volume pericardial effusion. No evidence of thoracic aortic aneurysm. Atherosclerotic calcifications of the aortic arch. Coronary atherosclerosis of the LAD and left circumflex. Mediastinum/Nodes: Bulky mediastinal lymphadenopathy, including a 2.8 cm short axis right paratracheal node (series 2/image 85) and a 4.5 cm subcarinal nodal mass. Tumor likely extends to the right hilar region, although this is poorly evaluated on unenhanced CT. Visualized thyroid is unremarkable. Lungs/Pleura: Endobronchial soft tissue occluding the right lower lobe bronchus (series 2/image 33). Associated masslike right lower lobe opacity, likely reflecting a combination of central tumor and postobstructive atelectasis/collapse, although ill-defined on unenhanced CT. A central tumor component of 5.6 cm is suspected (series 2/image 87). Complete right middle lobe atelectasis/collapse. Trace residual right pleural effusion. Associated pleural-based nodularity (series 2/images 50, 72, and 75), reflecting pleural metastases. 6 mm right upper lobe nodule (series 5/image 69), indeterminate. Left lung is clear. Mild centrilobular emphysematous changes. No pneumothorax. Upper Abdomen: Visualized upper abdomen is notable for 12 mm right adrenal nodule (series 2/image 150), new from 2020 CT abdomen/pelvis, suspicious for metastasis. Vascular calcifications. Musculoskeletal: Mild degenerative changes of the lower thoracic spine. IMPRESSION: Suspected large right lower lobe mass, poorly evaluated on unenhanced CT, with associated right middle and lower lobe atelectasis/collapse. This appearance is suspicious for primary bronchogenic neoplasm. Bulky mediastinal nodal metastases. Residual small right pleural effusion with  pleural-based metastases. Suspected small right adrenal metastasis. Aortic Atherosclerosis (ICD10-I70.0) and Emphysema (ICD10-J43.9). Electronically Signed   By: Julian Hy M.D.   On: 07/03/2022 19:34   DG Chest Port 1 View  Result Date: 07/03/2022 CLINICAL DATA:  Post right thoracentesis. EXAM: PORTABLE CHEST 1 VIEW COMPARISON:  07/02/2022 FINDINGS: Examination demonstrates interval improvement in patient's right-sided pleural effusion with residual small to moderate effusion present. No pneumothorax. Left lung unchanged. Cardiomediastinal silhouette and remainder of the exam is unchanged. IMPRESSION: Significant interval improvement in patient's right pleural effusion post thoracentesis. No pneumothorax. Electronically Signed   By: Marin Olp M.D.   On: 07/03/2022 16:37   DG CHEST PORT 1 VIEW  Result Date: 07/02/2022 CLINICAL DATA:  Pleural effusion EXAM: PORTABLE CHEST 1 VIEW COMPARISON:  06/30/2022 FINDINGS: Stable heart size. Aortic atherosclerosis. Moderate-large right pleural effusion with associated right basilar opacity, similar to prior. Left lung appears clear. No pneumothorax. IMPRESSION: Moderate-large right pleural effusion with associated right basilar opacity, similar to prior. Electronically  Signed   By: Davina Poke D.O.   On: 07/02/2022 14:12   US THORACENTESIS ASP PLEURAL SPACE W/IMG GUIDE  Result Date: 06/30/2022 INDICATION: Pleural effusion, history of breast cancer, shortness of breath EXAM: ULTRASOUND GUIDED RIGHT THORACENTESIS MEDICATIONS: None. COMPLICATIONS: None immediate. PROCEDURE: An ultrasound guided thoracentesis was thoroughly discussed with the patient and questions answered. The benefits, risks, alternatives and complications were also discussed. The patient understands and wishes to proceed with the procedure. Written consent was obtained. Ultrasound was performed to localize and mark an adequate pocket of fluid in the right chest. The area was then  prepped and draped in the normal sterile fashion. 1% Lidocaine was used for local anesthesia. Under ultrasound guidance a 6 Fr Safe-T-Centesis catheter was introduced. Thoracentesis was performed. The catheter was removed and a dressing applied. FINDINGS: A total of approximately 1.3L of red pleural fluid was removed. Samples were sent to the laboratory as requested by the clinical team. IMPRESSION: Successful ultrasound guided right thoracentesis yielding 1.3L of pleural fluid. Performed and dictated by Pasty Spillers, PA-C Electronically Signed   By: Markus Daft M.D.   On: 06/30/2022 21:31   DG CHEST PORT 1 VIEW  Result Date: 06/30/2022 CLINICAL DATA:  Post right-sided thoracentesis. EXAM: PORTABLE CHEST 1 VIEW COMPARISON:  06/29/2022 FINDINGS: Examination demonstrates a moderate to large right pleural effusion with mild interval improvement post thoracentesis. No right-sided pneumothorax. Left lung is clear. Mild stable cardiomegaly. Remainder of the exam is unchanged. IMPRESSION: 1. Moderate to large right pleural effusion with mild interval improvement post thoracentesis. No pneumothorax. 2. Mild stable cardiomegaly. Electronically Signed   By: Marin Olp M.D.   On: 06/30/2022 13:15   DG Chest 2 View  Result Date: 06/29/2022 CLINICAL DATA:  Dyspnea, pleural effusion EXAM: CHEST - 2 VIEW COMPARISON:  Radiographs 06/25/2022 FINDINGS: Since 06/25/2022, slightly increased moderate-large right pleural effusion and associated atelectasis. Remainder unchanged. Left basilar atelectasis. Chronic interstitial coarsening. Aortic calcification. Unchanged cardiomediastinal silhouette. No acute osseous abnormality. IMPRESSION: Increased moderate-large right pleural effusion. Electronically Signed   By: Placido Sou M.D.   On: 06/29/2022 15:36   DG Chest 2 View  Result Date: 06/25/2022 CLINICAL DATA:  Shortness of breath.  Atrial fibrillation. EXAM: CHEST - 2 VIEW COMPARISON:  05/02/2022 FINDINGS: Moderate  to large right pleural effusion occupies the lower 1/2 of right hemithorax. This is new from prior exam. Suspect associated atelectasis with basilar volume loss. Grossly stable heart size and mediastinal contours, aortic atherosclerosis. Chronic interstitial coarsening. No pneumothorax. No acute osseous findings. IMPRESSION: Moderate to large right pleural effusion, new from prior exam. Electronically Signed   By: Keith Rake M.D.   On: 06/25/2022 19:22     Future Appointments  Date Time Provider [place]  07/17/2022 11:15 AM Margaretha Seeds, MD [facility] None  07/19/2022 12:00 PM [facility] PET CT 1 [facility] [place]  07/30/2022  1:00 PM Conception Chancy, PsyD [facility]  12/04/2022 11:15 AM [facility] WE[facility]NESS VISIT [facility] None      LOS: 2 days   ADDENDUM: Hematology/Oncology Attending: I had a face-to-face encounter with the patient today.  I reviewed her records, lab, imaging studies and recommended her care plan.  I agree with the above note.  This is a very pleasant 76 years old white female recently diagnosed with extensive stage small cell lung cancer presented with large obstructive right lower lobe lung mass in addition to right hilar and mediastinal lymphadenopathy as well as other pulmonary nodules and recurrent right pleural  effusion likely malignant in addition to metastatic disease involving the right adrenal gland. The patient was seen in the clinic few days ago but she was admitted to the hospital over the weekend with shortness of breath and reaccumulation of the right pleural effusion. It was discussed with the patient during her outpatient visit consideration of treatment with systemic chemotherapy plus minus immunotherapy versus palliative care and hospice.  The patient changed her mind regarding the treatment and she is not interested in proceeding with systemic chemotherapy anymore.  The patient and her family are more interested in the palliative care option  at this point. I discussed her prognosis without treatment with the daughter and they understand that without treatment her prognosis will be very poor.  She may benefit from a short course of palliative radiotherapy to the obstructive right lung lesion in addition to repeat thoracentesis or Pleurx catheter placement for comfort. I will see the patient on as-needed basis at this point. Thank you for taking good care of Melody Casey, please call if you have any questions. Disclaimer: This note was dictated with voice recognition software. Similar sounding words can inadvertently be transcribed and may be missed upon review. Eilleen Kempf, MD

## 2022-07-16 NOTE — Progress Notes (Signed)
[facility]  PROGRESS NOTE  Melody Casey BBC:[id number] DOB: 12/24/1945 DOA: 07/14/2022 PCP: Dettinger, [name], MD   Brief HPI:   76 year old female with medical history of non-small cell lung cancer with recurrent pleural effusion, atrial fibrillation on Eliquis, CAD, diabetes mellitus type 2, hypertension, hyperlipidemia, stage I breast cancer in 2015, AAA, TAA presented with worsening shortness of breath.  Patient has recent diagnosis of non-small cell lung cancer diagnosed by bronchoscopy in July 2023.  She had thoracentesis on 07/03/2022 with 2.5 L of pleural fluid drained.  At that time there was no malignant cells identified, but felt to be malignant pleural effusion.  Chest x-ray obtained in the ED showed slight interval increase in the right pleural effusion with increased atelectasis/consolidation of right lung.  PCCM was consulted.  Patient underwent thoracentesis yesterday with removal of 1000 cc of serosanguineous appearing fluid from right pleural space.   Subjective   Patient seen and examined, breathing is significantly improved after thoracentesis.  No longer requiring oxygen.   Assessment/Plan:    Acute respiratory failure with hypoxemia -Secondary to right-sided pleural effusion -Currently on oxygen 3 L/min via nasal cannula  Right-sided pleural effusion -Likely malignant in setting of non-small cell lung cancer -Cytology has been negative in the past -Patient has been on Eliquis which is currently on hold for possible Pleurx catheter placement -Underwent thoracentesis yesterday with removal of 1000 cc of serosanguineous fluid -Patient also interested in getting Pleurx catheter -PCCM following; will discuss regarding Pleurx catheter placement today  Non-small cell lung cancer -Followed by oncology Dr. Julien Nordmann as outpatient -Dr. Julien Nordmann  was supposed to order PET scan for palliative care treatment -Plan to try radiation therapy and patient will discuss  possibility of starting immunotherapy -Dr. Julien Nordmann informed of patient's admission in the hospital  Persistent atrial fibrillation -Continue Tikosyn 250 mg twice a day, metoprolol 25 mg daily -Patient takes Eliquis for anticoagulation which is currently on hold for possible Pleurx catheter placement  Diabetes mellitus type 2 -Continue sliding scale insulin NovoLog -CBG well controlled  Hypertension -Blood pressure is stable -Continue metoprolol  History of TIA -Aspirin on hold for thoracentesis today  CAD s/p percutaneous coronary angioplasty -Continue Lipitor, Toprol XL -Aspirin on hold  Anxiety/depression -Continue Xanax 0.25 mg 3 times daily as needed   Medications     atorvastatin  60 mg Oral Daily   dofetilide  250 mcg Oral BID   feeding supplement  237 mL Oral TID BM   furosemide  40 mg Oral Daily   insulin aspart  0-9 Units Subcutaneous Q4H   magnesium oxide  400 mg Oral BID   metoprolol succinate  25 mg Oral Daily   multivitamin with minerals  1 tablet Oral Daily   sodium chloride flush  3 mL Intravenous Q12H   thiamine  100 mg Oral Daily     Data Reviewed:   CBG:  Recent Labs  Lab 07/15/22 2035 07/16/22 0126 07/16/22 0626 07/16/22 0756 07/16/22 1219  GLUCAP 169* 125* 134* 119* 151*    SpO2: 93 % O2 Flow Rate (L/min): 3 L/min    Vitals:   07/16/22 0441 07/16/22 0801 07/16/22 1337 07/16/22 1340  BP: (!) 105/49 (!) 118/50 (!) 98/48 109/65  Pulse: 72 72 94 73  Resp: 18 (!) 22 18   Temp: 98.6 F (37 C) 98.3 F (36.8 C) 98.6 F (37 C)   TempSrc: Oral Oral Oral   SpO2: 93% 90% 93%   Weight:  Height:          Data Reviewed:  Basic Metabolic Panel: Recent Labs  Lab 07/13/22 0833 07/14/22 1729 07/14/22 1935 07/15/22 0421 07/16/22 0320  NA 129* 134*  --  134* 134*  K 4.8 4.9  --  4.6 4.5  CL 101 104  --  105 106  CO2 22 23  --  23 23  GLUCOSE 163* 163*  --  126* 121*  BUN 42* 38*  --  38* 38*  CREATININE 1.00 1.05*  --   0.86 0.98  CALCIUM 9.7 9.8  --  9.2 9.2  MG  --   --  1.9  --   --   PHOS  --   --  3.0  --   --     CBC: Recent Labs  Lab 07/13/22 0833 07/14/22 1729 07/15/22 0421 07/16/22 0320  WBC 9.7 10.6* 8.9 9.8  NEUTROABS 7.2 8.3*  --   --   HGB 12.5 12.4 11.6* 10.9*  HCT 37.2 37.9 35.7* 33.5*  MCV 83.6 86.1 87.5 87.2  PLT 358 363 310 293    LFT Recent Labs  Lab 07/13/22 0833 07/14/22 1729 07/15/22 0421 07/16/22 0320  AST 23 24 16  13*  ALT 27 27 22 18   ALKPHOS 92 90 78 66  BILITOT 0.4 0.6 0.7 0.6  PROT 6.5 6.3* 5.7* 5.3*  ALBUMIN 3.3* 2.8* 2.6* 2.4*     Antibiotics: Anti-infectives (From admission, onward)    None        DVT prophylaxis: Eliquis on hold, SCDs  Code Status: DNR  Family Communication: No family at bedside   CONSULTS PCCM   Objective    Physical Examination:  General-appears in no acute distress Heart-S1-S2, regular, no murmur auscultated Lungs-clear to auscultation bilaterally, no wheezing or crackles auscultated Abdomen-soft, nontender, no organomegaly Extremities-no edema in the lower extremities Neuro-alert, oriented x3, no focal deficit noted    Status is: Inpatient:             [name]   [facility] If 7PM-7AM, please contact night-coverage at [email or web address], Office  [phone]   07/16/2022, 2:32 PM  LOS: 2 days

## 2022-07-16 NOTE — TOC Progression Note (Signed)
Transition of Care [facility]) - Progression Note    Patient Details  Name: HEATHERLY STENNER MRN: [id number] Date of Birth: Jun 15, 1946  Transition of Care [facility]) CM/SW [place], RN Phone Number: 07/16/2022, 3:08 PM  Clinical Narrative:   RNCM spoke with patient's daughter Juliann Pulse who reports patient the family plans to move patient to [facility] with family. Patient having a pleurx cath placed today and will get supplies from [facility]. This RNCM requested nursing secretary to have supplies delivered to room prior to d/c. RNCM notified MD for [facility] orders (PT, OT and RN).   TOC will continue to follow.    Expected Discharge Plan: [place] Barriers to Discharge: No Barriers Identified  Expected Discharge Plan and Services Expected Discharge Plan: [place]   Discharge Planning Services: Other - See comment ([facility] for pleur x equipment)   Living arrangements for the past 2 months: Single Family Home                     Date DME Agency Contacted: 07/16/22     HH Arranged: RN, PT, OT HH Agency: [facility]     Representative spoke with at [place]: Amy with [place]   Social Determinants of Health (SDOH) Interventions    Readmission Risk Interventions     No data to display

## 2022-07-16 NOTE — Consult Note (Signed)
Consultation Note Date: 07/16/2022   Patient Name: Melody Casey  DOB: 02-Apr-1946  MRN: [id number]  Age / Sex: 76 y.o., female  PCP: Dettinger, [name], MD Referring Physician: [name], MD  Reason for Consultation: Establishing goals of care  HPI/Patient Profile: 76 y.o. female  with past medical history of atrial fibrillation, CAD, NSTEMI, AAA, HTN, HLD, breast cancer, diabetes, diverticulitis, osteoporosis, ureteral obstruction s/p stent admitted on 07/14/2022 with shortness of breath with recently diagnosed small cell lung cancer requiring frequent thoracentesis and now consideration of PleurX and trying to decide for radiation, chemotherapy, or even hospice.   Clinical Assessment and Goals of Care: I met today with Melody Casey and later joined by her daughter Melody Casey. I have reviewed records and notes from current hospitalization and from oncology services. Noted PCCM consultation and discussion earlier today. Melody Casey is short of breath at rest. She is fatigued but able to talk and converse with me. She tells me that she has decided that she wants to proceed with PleurX placement. Melody Casey is undecided on her wishes for radiation, chemotherapy, or hospice. She is overwhelmed with the information and all the options. She does not seem inclined to pursue aggressive intervention but no firm decisions. She does indicate that the PleurX would hopefully give her better quality time to spend with her family. She feels that she has lost time unable to see her grandchildren in [place] during the [place] years and wishes to make this time up with them. Per our discussion it seems that if Melody Casey decides to pursue radiation this would be done here but  ultimately it seems her goal is to be in [place] with her family. If she decided to pursue chemotherapy this would likely be in [place]. All seem in agreement that moving to [place] in the  near future would likely be in her best interest so she can be with her family and also have a much better support system regardless of her decisions from here. She would like to proceed with PleurX placement and then we will continue our discussion regarding her wishes from there. I did offer and discuss with them the option of small dose opioid to assist with relief of shortness of breath symptoms while awaiting pleural effusion drainage. Both Melody Casey and daughter agree this would be helpful to have and I educated that this will be as needed and they will have to request from nurse when desired.   Further conversation with Dr. Carlis Abbott regarding concerns for ability to obtain drainage kits coverage after moving to [facility]. I called and spoke with Melody Casey further who has spoken with company that provides drainage kits and does not feel this is going to be an issue for them to change address to obtain. 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 She shares that her brother also has connections and they are working already to obtain oncology follow up at [facility]  and he has connections to providers that may assist with obtaining what they need. Melody Casey plans to check also with insurance to ensure this will be able to be covered if Melody Casey is moved to [place]. Melody Casey and her brother will be discussing again this evening but she feels confident that this can be sorted and arranged for her mother to get what she needs. I updated Dr. Carlis Abbott to my conversation with Melody Casey.   All questions/concerns addressed. Emotional support provided.   Primary Decision Maker PATIENT    SUMMARY OF RECOMMENDATIONS   DNR Move forward with PleurX placement Ongoing discussion needed  Code Status/Advance Care Planning: DNR   Symptom Management:  Shortness of breath: Morphine 1 mg every 4 hours PRN.   Prognosis:  Overall prognosis poor with  incurable lung cancer - to be determined based on decisions and tolerance of treatment if desired.   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 The following aspects are pertinent.  Past Medical History:  Diagnosis Date   AAA (abdominal aortic aneurysm) ([place])    Acute diverticulitis    Anxiety    Arthritis    Atrial fibrillation ([place])    a. Dx 03/2011 - on tikosyn/coumadin.   CAD (coronary artery disease)    a. NSTEMI 02/2011: occ mid Cx, DES to OM2, residual nonobst LAD dz.   Cancer [facility])    breast   Diabetes mellitus    Diverticulitis    Diverticulitis of colon     2008, 04/2011, 12/2014, 08/2015   Dysrhythmia    Foot deformity 1947   right; ??scleroderma   Hemangioma    liver   HLD (hyperlipidemia)    HTN (hypertension)    Osteoporosis    Tobacco abuse    stopped smoking 2012   Ureteral obstruction    History of gross hematuria/right hydronephrosis 2/2 to uteropelvic junction obstruction, s/p cystoscopy in January 2007 with bilateral retrograde pyelography, right ureter arthroscopy, right ureteral stent placement, bladder biopsies, stent removal since then.   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       2 children that live in  [place]. 2 dogs + farm animals.       Enjoys reading.    Social Determinants of Health   Financial Resource Strain: Not on file  Food Insecurity: Not on file  Transportation Needs: Not on file  Physical Activity: Not on file  Stress: Not on file  Social Connections: Not on file   Family History  Problem Relation Age of Onset   Lung cancer Father 49       deceased   Cancer Father    Heart failure Mother 81       deceased   Heart disease Mother    Diabetes Brother    Hypertension Brother    Cancer Brother        prostate   Colon cancer Neg Hx    Liver disease Neg Hx    Scheduled Meds:  atorvastatin  60 mg Oral Daily   dofetilide  250 mcg Oral BID   feeding supplement  237 mL Oral TID BM   furosemide  40 mg Oral Daily   insulin aspart  0-9 Units Subcutaneous Q4H   magnesium oxide  400 mg Oral BID   metoprolol succinate  25 mg Oral Daily   multivitamin with minerals  1 tablet Oral Daily   sodium chloride flush  3 mL Intravenous Q12H   thiamine  100 mg Oral Daily   Continuous Infusions:  sodium chloride     PRN Meds:.sodium chloride, albuterol, ALPRAZolam, morphine injection, sodium chloride flush Allergies  Allergen Reactions   Miralax [Polyethylene Glycol] Swelling and Rash    Took [facility] brand developed rash. Patient states she tolerated name brand MiraLax in the past.  09/01/15. Patient states she had diffuse swelling including swelling of her lips.    Vancomycin Rash and Shortness Of Breath    Acetaminophen Hives   Banana Other (See Comments)    Upset stomach   Oxycodone-Acetaminophen Itching   Sulfa Antibiotics Rash    All over rash   Sulfacetamide Sodium Rash    All over rash   Sulfasalazine Rash    All over rash    Cefepime     rash   Latex Rash   Oxycodone-Acetaminophen Rash   Sulfacetamide Sodium Rash   Sulfacetamide Sodium-Sulfur Rash   Tape Rash   Review of Systems  Constitutional:  Positive for activity change and appetite change.  Respiratory:  Positive for shortness of breath.     Physical Exam Vitals and nursing note reviewed.  Constitutional:      Appearance: She is ill-appearing.  Cardiovascular:     Rate and Rhythm: Normal rate.  Pulmonary:     Effort: Tachypnea present. No accessory muscle usage or respiratory distress.     Comments: SOB at rest with conversation Abdominal:     General: Abdomen is flat.     Palpations: Abdomen is soft.  Neurological:     Mental Status: She is alert and oriented to person, place, and time.     Vital Signs: BP 109/65 (BP Location: Right Arm)   Pulse 73   Temp 98.6 F (37 C) (Oral)   Resp 18   Ht 5' 7"  (1.702 m)   Wt 73.8 kg   SpO2 93%   BMI 25.48 kg/m  Pain Scale: 0-10   Pain Score: 0-No pain   SpO2: SpO2: 93 % O2 Device:SpO2: 93 % O2 Flow Rate: .O2 Flow Rate (L/min): 3 L/min  IO: Intake/output summary:  Intake/Output Summary (Last 24 hours) at 07/16/2022 1529 Last data  filed at 07/16/2022 4492 Gross per 24 hour  Intake 373 ml  Output 400 ml  Net -27 ml    LBM: Last BM Date : 07/15/22 Baseline Weight: Weight: 73.5 kg Most recent weight: Weight: 73.8 kg     Palliative Assessment/Data:     Time In: 1300  Time Total: 75 min  Greater than 50%  of this time was spent counseling and coordinating care related to the above assessment and plan.  Signed by: Vinie Sill, NP Palliative Medicine Team Pager # [phone] (M-F 8a-5p) Team Phone # [phone] (Nights/Weekends)

## 2022-07-16 NOTE — Progress Notes (Signed)
       CROSS COVER NOTE  NAME: Melody Casey MRN: [id number] DOB : 1946-12-01    Date of Service   07/16/2022  HPI/Events of Note   Medication request received for sleep aid.  Interventions   Plan: Melatonin     This document was prepared using Dragon voice recognition software and may include unintentional dictation errors.  Neomia Glass DNP, MHA, FNP-BC Nurse Practitioner [facility] Pager [phone]

## 2022-07-16 NOTE — Discharge Instructions (Addendum)
PleurX Drainage Instructions  PleurX Home Management If draining >34ml, drain daily up to 1L If draining <500 ml, drain every other day  Once drained, you can empty contents of bottle into the toilet and throw bottle into normal trash.   If you have difficulty remembering the steps, go to YouTube and look at the training video provided by [employer] for PleurX catheters.  It is an excellent example of how to drain.      High Calorie, High Protein Nutrition Therapy  A high-calorie, high-protein diet has been recommended to you. Your registered dietitian nutritionist (RDN) may have recommended this diet because you are having difficulty eating enough calories throughout the day, you have lost weight, and/or you need to add protein to your diet.  Sometimes you may not feel like eating, even if you know the importance of good nutrition. The recommendations in this handout can help you with the following:  Regaining your strength and energy Keeping your body healthy Healing and recovering from surgery or illness and fighting infection  Schedule Your Meals and Snacks  Several small meals and snacks are often better tolerated and digested than large meals.  Strategies  Plan to eat 3 meals and 3 snacks daily. Experiment with timing meals to find out when you have a larger appetite. Appetite may be greatest in the morning after not eating all night so you may prefer to eat your larger meals and snacks in the morning and at lunch. Breakfast-type foods are often better tolerated so eat foods such as eggs, pancakes, waffles and cereal for any meal or snack. Carry snacks with you so you are prepared to eat every 2 to 3 hours. Determine what works best for you if your body's cues for feeling hungry or full are not working. Eat a small meal or snack even if you don't feel hungry. Set a timer to remind you when it is time to eat. Take a walk before you eat (with health care provider's approval). Light  or moderate physical activity can help you maintain muscle and increase your appetite.  Make Eating Enjoyable  Taking steps to make the experience enjoyable may help to increase your interest in eating and improve your appetite.  Strategies:  Eat with others whenever possible. Include your favorite foods to make meals more enjoyable. Try new foods. Save your beverage for the end of the meal so that you have more room for food before you get full.  Add Calories to Your Meals and Snacks  Try adding calorie-dense foods so that each bite provides more nutrition.  Strategies  Drink milk, chocolate milk, soy milk, or smoothies instead of low-calorie beverages such as diet drinks or water. Cook with milk or soy milk instead of water when making dishes such as hot cereal, cocoa, or pudding. Add jelly, jam, honey, butter or margarine to bread and crackers. Add jam or fruit to ice cream and as a topping over cake. Mix dried fruit, nuts, granola, honey, or dry cereal with yogurt or hot cereals. Enjoy snacks such as milkshakes, smoothies, pudding, ice cream, or custard. Blend a fruit smoothie of a banana, frozen berries, milk or soy milk, and 1 tablespoon nonfat powdered milk or protein powder.  Add Protein to Your Meals and Snacks  Choose at least one protein food at each meal and snack to increase your daily intake.  Strategies  Add  cup nonfat dry milk powder or protein powder to make a high-protein milk to drink or to  use in recipes that call for milk. Vanilla or peppermint extract or unsweetened cocoa powder could help to boost the flavor. Add hard-cooked eggs, leftover meat, grated cheese, canned beans or tofu to noodles, rice, salads, sandwiches, soups, casseroles, pasta, tuna and other mixed dishes. Add powdered milk or protein powder to hot cereals, meatloaf, casseroles, scrambled eggs, sauces, cream soups, and shakes. Add beans and lentils to salads, soups, casseroles, and vegetable  dishes. Eat cottage cheese or yogurt, especially Greek yogurt, with fruit as a snack or dessert. Eat peanut or other nut butters on crackers, bread, toast, waffles, apples, bananas or celery sticks. Add it to milkshakes, smoothies, or desserts. Consider a ready-made protein shake.   Add Fats to Your Meals and Snacks  Try adding fats to your meals and snacks. Fat provides more calories in fewer bites than carbohydrate or protein and adds flavors to your foods.  Strategies  Snack on nuts and seeds or add them to foods like salads, pasta, cereals, yogurt, and ice cream.  Saut or stir-fry vegetables, meats, chicken, fish or tofu in olive or canola oil.  Add olive oil, other vegetable oils, butter or margarine to soups, vegetables, potatoes, cooked cereal, rice, pasta, bread, crackers, pancakes, or waffles. Snack on olives or add to pasta, pizza, or salad. Add avocado or guacamole to your salads, sandwiches, and other entrees. Include fatty fish such as salmon in your weekly meal plan.  Tips For Adding Protein Nutrition Therapy    Tips Add extra egg to one or more meals  Increase the portion of milk to drink and change to skim milk if able  Include [place] yogurt or cottage cheese for snack or part of a meal  Increase portion size of protein entre and decrease portion of starch/bread  Mix protein powder, nut butter, almond/nut milk, non-fat dry milk, or Greek yogurt to shakes and smoothies  Use these ingredients also in baked goods or other recipes Use double the amount of sandwich filling  Add protein foods to all snacks including cheese, nut butters, milk and yogurt  Food Tips for Including Protein  Beans Cook and use dried peas, beans, and tofu in soups or add to casseroles, pastas, and grain dishes that also contain cheese or meat  Mash with cheese and milk  Use tofu to make smoothies  Commercial Protein Supplements Use nutritional supplements or protein powder sold at pharmacies  and grocery stores  Use protein powder in milk drinks and desserts, such as pudding  Mix with ice cream, milk, and fruit or other flavorings for a high-protein milkshake  Cottage Cheese or [employer] Mix with or use to stuff fruits and vegetables  Add to casseroles, spaghetti, noodles, or egg dishes such as omelets, scrambled eggs, and souffls  Use gelatin, pudding-type desserts, cheesecake, and pancake or waffle batter  Use to stuff crepes, pasta shells, or manicotti  Puree and use as a substitute for sour cream  Eggs, Egg whites, and Egg Yolks Add chopped, hard-cooked eggs to salads and dressings, vegetables, casseroles, and creamed meats  Beat eggs into mashed potatoes, vegetable purees, and sauces  Add extra egg whites to quiches, scrambled eggs, custards, puddings, pancake batter, or [place] toast wash/batter  Make a rich custard with egg yolks, double strength milk, and sugar  Add extra hard-cooked yolks to deviled egg filling and sandwich spreads  Hard or Semi-Soft Cheese (Cheddar, [place], [place]) Melt on sandwiches, bread, muffins, tortillas, hamburgers, hot dogs, other meats or fish, vegetables, eggs, or desserts  such as stewed figs or pies  Grate and add to soups, sauces, casseroles, vegetable dishes, potatoes, rice noodles, or meatloaf  Serve as a snack with crackers or bagels  Ice cream, Yogurt, and Frozen Yogurt Add to milk drinks such as milkshakes  Add to cereals, fruits, gelatin desserts, and pies  Blend or whip with soft or cooked fruits  Sandwich ice cream or frozen yogurt between enriched cake slices, cookies, or graham crackers  Use seasoned yogurt as a dip for fruits, vegetables, or chips  Use yogurt in place of sour cream in casseroles  Meat and Fish Add chopped, cooked meat or fish to vegetables, salads, casseroles, soups, sauces, and biscuit dough  Use in omelets, souffls, quiches, and sandwich fillings  Add chicken and [place] to stuffing  Wrap in pie crust or  biscuit dough as turnovers  Add to stuffed baked potatoes  Add pureed meat to soups  Milk Use in beverages and in cooking  Use in preparing foods, such as hot cereal, soups, cocoa, or pudding  Add cream sauces to vegetable and other dishes  Use evaporated milk, evaporated skim milk, or sweetened condensed milk instead of milk or water in recipes.  Nonfat Dry Milk Add 1/3 cup of nonfat dry milk powdered milk to each cup of regular milk for "double strength" milk  Add to yogurt and milk drinks, such as pasteurized eggnog and milkshakes  Add to scrambled eggs and mashed potatoes  Use in casseroles, meatloaf, hot cereal, breads, muffins, sauces, cream soups, puddings and custards, and other milk-based desserts  Nuts, Seeds, and Wheat Germ Add to casseroles, breads, muffins, pancakes, cookies, and waffles  Sprinkle on fruit, cereal, ice cream, yogurt, vegetables, salads, and toast as a crunchy topping  Use in place of breadcrumbs  Blend with parsley or spinach, herbs, and cream for a noodle, pasta, or vegetable sauce.  Roll banana in chopped nuts  Peanut Butter Spread on sandwiches, toast, muffins, crackers, waffles, pancakes, and fruit slices  Use as a dip for raw vegetables, such as carrots, cauliflower, and celery  Blend with milk drinks, smoothies, and other beverages  Swirl through soft ice cream or yogurt  Spread on a banana then roll in crushed, dry cereal or chopped nuts   Small Meal and Snack Ideas  These snacks and meals are recommended when you have to eat but aren't necessarily hungry.  They are good choices because they are high in protein and high in calories.   2 graham crackers, 2 tablespoons peanut or other nut butter, 1 cup milk  cup [place] yogurt,  cup fruit,  cup granola 2 deviled egg halves, 5 whole wheat crackers 1 cup cream of tomato soup,  grilled cheese sandwich 1 toasted waffle topped with: 2 tablespoons peanut or nut butter, 1 tablespoon jam Trail mix made  with:  cup nuts,  cup dried fruit,  cup cold cereal, any variety  cup oatmeal or cream of wheat cereal, 1 tablespoon peanut or nut butter,  cup diced fruit  High-Calorie, High-Protein Sample 1-Day Menu   Breakfast 1 egg, scrambled 1 ounce cheddar cheese 1 English muffin, whole wheat 1 tablespoon margarine 1 tablespoon jam  cup orange juice, fortified with calcium and vitamin D  Morning Snack 1 tablespoon peanut butter 1 banana 1 cup 1% milk  Lunch Tuna salad sandwich made with: 2 slices bread, whole wheat 3 ounces tuna mixed with: 1 tablespoon mayonnaise  cup pudding  Afternoon Snack  cup hummus  cup carrots 1  pita  Evening Meal Enchilada casserole made with: 2 corn tortillas 3 ounces ground beef, cooked  cup black beans, cooked  cup corn, cooked 1 ounce grated cheddar cheese  cup enchilada sauce  avocado, sliced, topping for enchilada 1 tablespoon sour cream, topping for enchilada Salad:  cup lettuce, shredded  cup tomatoes, chopped, for salad 1 tablespoon olive oil and vinegar dressing, for salad  Evening Snack  cup Greek yogurt  cup blueberries  cup granola  Copyright 2020  [employer]. All rights reserved

## 2022-07-16 NOTE — Evaluation (Signed)
Physical Therapy Evaluation Patient Details Name: Melody Casey MRN: [id number] DOB: Jul 19, 1946 Today's Date: 07/16/2022  History of Present Illness  Patient is a 76 year old female who was admitted with increased shortness of breath.patient recently diagnosed with lung cancer.  patient PMH: a fib, HTN, scleroderma, CAD s/p PCI, diverticulitis/perforated sigmoid colon with colostomy, DM, breast cancer, AAA, L TKA,   Clinical Impression  Melody Casey is 76 y.o. female admitted with above HPI and diagnosis. Patient is currently limited by functional impairments below (see PT problem list). Patient lives alone and is independent at baseline, however her family is hopeful she will return to [place] to be closer to them. Patient is undecided and will return home. She is limited by fatigue and generalized weakness, and was able to ambulate short distance to bathroom and in room this session. Patient will benefit from continued skilled PT interventions to address impairments and progress independence with mobility, recommending HHPT with assist from family intermittently. Pt mentioned having difficulty with decision making regarding hospice vs therapeutic treatments, will benefit from palliative medicine consult. Acute PT will follow and progress as able.        Recommendations for follow up therapy are one component of a multi-disciplinary discharge planning process, led by the attending physician.  Recommendations may be updated based on patient status, additional functional criteria and insurance authorization.  Follow Up Recommendations Home health PT      Assistance Recommended at Discharge PRN  Patient can return home with the following  Help with stairs or ramp for entrance;A little help with walking and/or transfers;A little help with bathing/dressing/bathroom;Assistance with cooking/housework;Direct supervision/assist for medications management;Assist for transportation    Equipment Recommendations  None recommended by PT  Recommendations for Other Services       Functional Status Assessment Patient has had a recent decline in their functional status and demonstrates the ability to make significant improvements in function in a reasonable and predictable amount of time.     Precautions / Restrictions Precautions Precautions: Fall Precaution Comments: colostomy(2019), monitor O2 Restrictions Weight Bearing Restrictions: No      Mobility  Bed Mobility Overal bed mobility: Needs Assistance Bed Mobility: Supine to Sit     Supine to sit: Min guard, HOB elevated     General bed mobility comments: pt taking extra time and cues needed to use bed rail, pt able to bring LE's off side and sit trunk up.    Transfers Overall transfer level: Needs assistance Equipment used: Rolling walker (2 wheels) Transfers: Sit to/from Stand Sit to Stand: Min assist, Min guard           General transfer comment: pt placing bil UE on RW for power up from EOB and toilet. cues to use grab bar in bathroom. Min guard to rise from EOB and assist from toilet.    Ambulation/Gait Ambulation/Gait assistance: Min guard, Supervision Gait Distance (Feet): 25 Feet (10, 15) Assistive device: Rolling walker (2 wheels) Gait Pattern/deviations: Step-through pattern, Trunk flexed, Narrow base of support Gait velocity: decreased     General Gait Details: pt with flexed trunk and tendency to advance walker too far ahead. cues and assist needed to correct. pt fatigued quickly and SOB with ambulation to bathroom.  Stairs            Wheelchair Mobility    Modified Rankin (Stroke Patients Only)       Balance Overall balance assessment: Needs assistance Sitting-balance support: No upper extremity supported Sitting balance-Leahy Scale:  Good     Standing balance support: No upper extremity supported Standing balance-Leahy Scale: Fair Standing balance comment: RW to ambulate but could static  stand, 1UE on grabbar while completing pericare.                             Pertinent Vitals/Pain Pain Assessment Pain Assessment: No/denies pain    Home Living   Living Arrangements: Alone Available Help at Discharge: Family;Available 24 hours/day (daughter going back to [place]) Type of Home: House Home Access: Stairs to enter Entrance Stairs-Rails: Right;Left;Can reach both Entrance Stairs-Number of Steps: 3   Home Layout: One level Home Equipment: Conservation officer, nature (2 wheels);BSC/3in1;Cane - single point Additional Comments: son and dtr in [place] an done in [place], sonm wants her in [place]    Prior Function Prior Level of Function : Independent/Modified Independent                     Hand Dominance        Extremity/Trunk Assessment   Upper Extremity Assessment Upper Extremity Assessment: Defer to OT evaluation    Lower Extremity Assessment Lower Extremity Assessment: Generalized weakness RLE Deficits / Details: hx right foot deformity    Cervical / Trunk Assessment Cervical / Trunk Assessment: Kyphotic;Other exceptions Cervical / Trunk Exceptions: concave waist on Lt side  Communication   Communication: HOH  Cognition Arousal/Alertness: Awake/alert Behavior During Therapy: WFL for tasks assessed/performed Overall Cognitive Status: Within Functional Limits for tasks assessed                                          General Comments      Exercises     Assessment/Plan    PT Assessment    PT Problem List         PT Treatment Interventions      PT Goals (Current goals can be found in the Care Plan section)  Acute Rehab PT Goals Patient Stated Goal: figure out what to do about hospice vs chemotherapy PT Goal Formulation: With patient Time For Goal Achievement: 07/14/22 Potential to Achieve Goals: Good    Frequency Min 3X/week     Co-evaluation               AM-PAC PT "6 Clicks" Mobility  Outcome Measure Help  needed turning from your back to your side while in a flat bed without using bedrails?: None Help needed moving from lying on your back to sitting on the side of a flat bed without using bedrails?: A Little Help needed moving to and from a bed to a chair (including a wheelchair)?: A Little Help needed standing up from a chair using your arms (e.g., wheelchair or bedside chair)?: A Little Help needed to walk in hospital room?: A Little Help needed climbing 3-5 steps with a railing? : A Lot 6 Click Score: 18    End of Session Equipment Utilized During Treatment: Gait belt Activity Tolerance: Patient limited by fatigue Patient left: with call bell/phone within reach;in chair;with chair alarm set Nurse Communication: Mobility status PT Visit Diagnosis: Difficulty in walking, not elsewhere classified (R26.2)    Time: 1914-7829 PT Time Calculation (min) (ACUTE ONLY): 27 min   Charges:   PT Evaluation $PT Eval Moderate Complexity: 1 Mod PT Treatments $Gait Training: 8-22 mins  Verner Mould, DPT Acute Rehabilitation Services Office [phone] Pager [phone]  07/16/22 11:34 AM

## 2022-07-16 NOTE — Plan of Care (Signed)
Planning for PleurX tomorrow morning, likely will be transferred to ICU/SD to monitor then transfer back to the floor.  Julian Hy, DO 07/16/22 4:12 PM [facility]

## 2022-07-16 NOTE — Plan of Care (Signed)
  Problem: Coping: Goal: Ability to adjust to condition or change in health will improve Outcome: Progressing   Problem: Fluid Volume: Goal: Ability to maintain a balanced intake and output will improve Outcome: Progressing   Problem: Nutritional: Goal: Maintenance of adequate nutrition will improve Outcome: Progressing   Problem: Skin Integrity: Goal: Risk for impaired skin integrity will decrease Outcome: Progressing

## 2022-07-16 NOTE — Progress Notes (Signed)
   NAME:  Melody Casey, MRN:  [id number], DOB:  [birth year], LOS: 2 ADMISSION DATE:  07/14/2022, CONSULTATION DATE:  07/16/22 REFERRING MD: Dr. Darrick Meigs, TRH, CHIEF COMPLAINT: Dyspnea, shortness of breath  History of Present Illness:  76 year old woman admitted to ED with worsening dyspnea on exertion found to have enlarging right-sided pleural effusion.  She has a history of small cell lung cancer.  Recently diagnosed.  Had thoracentesis x2, 7/22 and 7/25.  1.5 L and 2.5 L the second time.  Cytology negative x2.  Underwent bronchoscopy with biopsy that showed small cell carcinoma.  Present with oncology.  I note reviewed.  Worsening shortness of breath started about 48 hours ago.  Presented to the ED late in the day yesterday.  Chest x-ray notable for slightly enlarged right-sided pleural effusion compared to 07/08/2022.  She was not hypoxemic.  Instructed to hold Eliquis for possible procedures.  She asked about Pleurx catheter.  Pertinent  Medical History  Small cell lung cancer  Significant Hospital Events: Including procedures, antibiotic start and stop dates in addition to other pertinent events   8/5 admitted with worsening shortness of breath not hypoxemic on presentation 8/7 No acute issues overnight, patient and family are interested in Pleurx and daughter confirms that she is willing and able to assist in management of catheter at home  Interim History / Subjective:  Reports she continues to feel weak but breathing is improved post thoracentesis   Objective   Blood pressure (!) 118/50, pulse 72, temperature 98.3 F (36.8 C), temperature source Oral, resp. rate (!) 22, height 5\' 7"  (1.702 m), weight 73.8 kg, SpO2 90 %.        Intake/Output Summary (Last 24 hours) at 07/16/2022 0958 Last data filed at 07/16/2022 8527 Gross per 24 hour  Intake 376 ml  Output 850 ml  Net -474 ml    Filed Weights   07/14/22 1637 07/14/22 2027  Weight: 73.5 kg 73.8 kg    Examination: General: Acute  on chronically ill appearing elderly female lying in bed, in NAD HEENT: [place]/AT, MM pink/moist, PERRL,  Neuro: Alert and oriented x3, non-focal  CV: s1s2 regular rate and rhythm, no murmur, rubs, or gallops,  PULM:  Clear to left, diminished to right, on RA, no increased work of breathing no added breath sounds  GI: soft, bowel sounds active in all 4 quadrants, non-tender, non-distended, tolerating oral diet  Extremities: warm/dry, no edema  Skin: no rashes or lesions  Resolved Hospital Problem list   N/a  Assessment & Plan:  Large right pleural effusion  -Suspect malignant in setting of small cell carcinoma.  Cytology x2 has been negative but exudative and lymphocytic predominant.  More symptomatic.  She is on Eliquis for A-fib.  -Underwent right thoracentesis 8/6 with 1L of serosanguinous fluid removed  P: Coordinated with Case management to determine insurance coverage of Pleurx and patient has medicare covering 80% Discussed with patient and family indications for Pleurx and daughter confirms she is able to care for catheter at home  Continue to hold home Eliquis  Will assess with ultrasound safe pocket for placement today  Supportive care   Best Practice (right click and "Reselect all SmartList Selections" daily)  Per primary  Critical care time: NA  Yariela Tison D. Kenton Kingfisher, NP-C [facility] Personal contact information can be found on Amion  07/16/2022, 10:04 AM

## 2022-07-17 ENCOUNTER — Institutional Professional Consult (permissible substitution): Payer: Medicare Other | Admitting: Pulmonary Disease

## 2022-07-17 ENCOUNTER — Ambulatory Visit: Payer: Medicare Other | Admitting: Radiation Oncology

## 2022-07-17 ENCOUNTER — Inpatient Hospital Stay (HOSPITAL_COMMUNITY): Payer: Medicare Other

## 2022-07-17 DIAGNOSIS — J9601 Acute respiratory failure with hypoxia: Secondary | ICD-10-CM | POA: Diagnosis not present

## 2022-07-17 DIAGNOSIS — Z7189 Other specified counseling: Secondary | ICD-10-CM | POA: Diagnosis not present

## 2022-07-17 DIAGNOSIS — I10 Essential (primary) hypertension: Secondary | ICD-10-CM | POA: Diagnosis not present

## 2022-07-17 DIAGNOSIS — C3432 Malignant neoplasm of lower lobe, left bronchus or lung: Secondary | ICD-10-CM | POA: Diagnosis not present

## 2022-07-17 DIAGNOSIS — Z515 Encounter for palliative care: Secondary | ICD-10-CM | POA: Diagnosis not present

## 2022-07-17 DIAGNOSIS — J90 Pleural effusion, not elsewhere classified: Secondary | ICD-10-CM | POA: Diagnosis not present

## 2022-07-17 DIAGNOSIS — J910 Malignant pleural effusion: Secondary | ICD-10-CM

## 2022-07-17 LAB — GLUCOSE, CAPILLARY
Glucose-Capillary: 126 mg/dL — ABNORMAL HIGH (ref 70–99)
Glucose-Capillary: 126 mg/dL — ABNORMAL HIGH (ref 70–99)
Glucose-Capillary: 131 mg/dL — ABNORMAL HIGH (ref 70–99)
Glucose-Capillary: 134 mg/dL — ABNORMAL HIGH (ref 70–99)
Glucose-Capillary: 140 mg/dL — ABNORMAL HIGH (ref 70–99)
Glucose-Capillary: 200 mg/dL — ABNORMAL HIGH (ref 70–99)

## 2022-07-17 MED ORDER — LIDOCAINE HCL 1 % IJ SOLN
INTRAMUSCULAR | Status: AC
  Administered 2022-07-17: 20 mL
  Filled 2022-07-17: qty 20

## 2022-07-17 MED ORDER — LIDOCAINE HCL (PF) 1 % IJ SOLN
INTRAMUSCULAR | Status: DC
Start: 2022-07-17 — End: 2022-07-17
  Filled 2022-07-17: qty 5

## 2022-07-17 MED ORDER — INSULIN ASPART 100 UNIT/ML IJ SOLN
0.0000 [IU] | Freq: Three times a day (TID) | INTRAMUSCULAR | Status: DC
  Administered 2022-07-17 (×2): 1 [IU] via SUBCUTANEOUS
  Administered 2022-07-18 (×2): 2 [IU] via SUBCUTANEOUS
  Administered 2022-07-18 – 2022-07-19 (×3): 1 [IU] via SUBCUTANEOUS
  Administered 2022-07-19 – 2022-07-20 (×2): 2 [IU] via SUBCUTANEOUS
  Administered 2022-07-20: 1 [IU] via SUBCUTANEOUS

## 2022-07-17 MED ORDER — SODIUM CHLORIDE 0.9 % IV SOLN
2.0000 g | Freq: Once | INTRAVENOUS | Status: AC
  Administered 2022-07-17: 2 g via INTRAVENOUS
  Filled 2022-07-17 (×2): qty 2000

## 2022-07-17 NOTE — Progress Notes (Addendum)
Palliative:  HPI: 76 y.o. female  with past medical history of atrial fibrillation, CAD, NSTEMI, AAA, HTN, HLD, breast cancer, diabetes, diverticulitis, osteoporosis, ureteral obstruction s/p stent admitted on 07/14/2022 with shortness of breath with recently diagnosed small cell lung cancer requiring frequent thoracentesis and now consideration of PleurX and trying to decide for radiation, chemotherapy, or even hospice.    I met today at Melody Casey' bedside and spoke more with Melody Casey as her mother slept peacefully. Melody Casey has been coordinating with family plans moving forward. They confirm continued desire to move forward with PleurX and family feel confident they can work with company, insurance, and providers to get her what she needs. Melody Casey shares that they have spoken with [facility] already and have plans to get her to [facility] to be with family and they have reached out to connections at [facility] for follow up if she decides to pursue treatment. Melody Casey also notes that she has names of hospice agencies they will consult with if Melody Casey does not want to pursue treatment. They feel confident that they will be able to get her what she needs. They want to be able to take her home to spend a few days in there home and then fly her to [place]. They do not at this time want to initiate radiation here and be here for two weeks. I spoke with Dr. Carlis Abbott and then again with Melody Casey and explained that Dr. Carlis Abbott would like to have a clear timeline and providers so that we can help ensure there is no lapse in care and Melody Casey will be able to get everything she needs. Melody Casey understands and will continue to work with her family to coordinate. She also asks about resources to assist with assets and planning and I encouraged her to speak with an attorney and an elder law attorney may be best.   All questions/concerns addressed. Emotional support provided. Discussed with Dr. Carlis Abbott and Shona Simpson, PA.    Exam: Sleeping (she did awaken to see me at bedside but went back to sleep). No distress. Breathing regular, unlabored. Abd flat.   Plan:  - DNR - Proceed with PleurX placement - Plans for home (likely tomorrow) with subsequent transition to [place] in the coming days - Will decide over coming days if she desires radiation or systemic treatment and if so they wish to pursue this in [place], NP Palliative Medicine Team Pager [phone] (Please see [email or web address] for schedule) Team Phone [phone]    Greater than 50%  of this time was spent counseling and coordinating care related to the above assessment and plan

## 2022-07-17 NOTE — Progress Notes (Signed)
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  Patient has recent diagnosis of non-small cell lung cancer diagnosed by bronchoscopy in July 2023.  She had thoracentesis on 07/03/2022 with 2.5 L of pleural fluid drained.  At that time there was no malignant cells identified, but felt to be malignant pleural effusion.  Chest x-ray obtained in the ED showed slight interval increase in the right pleural effusion with increased atelectasis/consolidation of right lung.  PCCM was consulted.  Patient underwent thoracentesis yesterday with removal of 1000 cc of serosanguineous appearing fluid from right pleural space.   Subjective   Patient seen and examined, feels short of breath this morning.  Plan for Pleurx catheter placement today   Assessment/Plan:    Acute respiratory failure with hypoxemia -Secondary to right-sided pleural effusion -Currently on oxygen 3 L/min via nasal cannula  Right-sided pleural effusion -Likely malignant in setting of non-small cell lung cancer -Cytology has been negative in the past -Patient has been on Eliquis which is currently on hold for possible Pleurx catheter placement -Underwent thoracentesis yesterday with removal of 1000 cc of serosanguineous fluid -Patient also interested in getting Pleurx catheter -PCCM following; plan for Pleurx catheter placement today  Non-small cell lung cancer -Followed by oncology Dr. Julien Nordmann as outpatient -Dr. Julien Nordmann  was supposed to order PET scan for palliative care treatment -Plan to try radiation therapy and patient will discuss possibility of starting  immunotherapy -Dr. Julien Nordmann informed of patient's admission in the hospital  Persistent atrial fibrillation -Continue Tikosyn 250 mg twice a day, metoprolol 25 mg daily -Patient takes Eliquis for anticoagulation which is currently on hold for possible Pleurx catheter placement  Diabetes mellitus type 2 -Continue sliding scale insulin NovoLog -CBG well controlled  Hypertension -Blood pressure is stable -Continue metoprolol  History of TIA -Aspirin on hold for Pleurx catheter placement  CAD s/p percutaneous coronary angioplasty -Continue Lipitor, Toprol XL -Aspirin on hold  Anxiety/depression -Continue Xanax 0.25 mg 3 times daily as needed   Medications     atorvastatin  60 mg Oral Daily   dofetilide  250 mcg Oral BID   feeding supplement  237 mL Oral TID BM   furosemide  40 mg Oral Daily   insulin aspart  0-9 Units Subcutaneous TID WC   magnesium oxide  400 mg Oral BID   metoprolol succinate  25 mg Oral Daily   multivitamin with minerals  1 tablet Oral Daily   sodium chloride flush  3 mL Intravenous Q12H   thiamine  100 mg Oral Daily     Data Reviewed:   CBG:  Recent Labs  Lab 07/16/22 1937 07/17/22 0008 07/17/22 0423 07/17/22 0728 07/17/22 1157  GLUCAP 133* 126* 140* 126* 131*    SpO2: 93 % O2 Flow Rate (L/min): 3 L/min    Vitals:   07/17/22 0201 07/17/22 0427 07/17/22 0700 07/17/22 0726  BP: 97/61 (!) 120/59  (!) 100/54  Pulse: 74 90  88  Resp: 17 20 15 20   Temp: (!) 97.5 F (36.4 C) 98.1 F (36.7 C)  98.5 F (36.9 C)  TempSrc: Oral Oral  Oral  SpO2: (!) 65% 96%  93%  Weight:  Height:          Data Reviewed:  Basic Metabolic Panel: Recent Labs  Lab 07/13/22 0833 07/14/22 1729 07/14/22 1935 07/15/22 0421 07/16/22 0320  NA 129* 134*  --  134* 134*  K 4.8 4.9  --  4.6 4.5  CL 101 104  --  105 106  CO2 22 23  --  23 23  GLUCOSE 163* 163*  --  126* 121*  BUN 42* 38*  --  38* 38*  CREATININE 1.00 1.05*  --  0.86 0.98  CALCIUM  9.7 9.8  --  9.2 9.2  MG  --   --  1.9  --   --   PHOS  --   --  3.0  --   --     CBC: Recent Labs  Lab 07/13/22 0833 07/14/22 1729 07/15/22 0421 07/16/22 0320  WBC 9.7 10.6* 8.9 9.8  NEUTROABS 7.2 8.3*  --   --   HGB 12.5 12.4 11.6* 10.9*  HCT 37.2 37.9 35.7* 33.5*  MCV 83.6 86.1 87.5 87.2  PLT 358 363 310 293    LFT Recent Labs  Lab 07/13/22 0833 07/14/22 1729 07/15/22 0421 07/16/22 0320  AST 23 24 16  13*  ALT 27 27 22 18   ALKPHOS 92 90 78 66  BILITOT 0.4 0.6 0.7 0.6  PROT 6.5 6.3* 5.7* 5.3*  ALBUMIN 3.3* 2.8* 2.6* 2.4*     Antibiotics: Anti-infectives (From admission, onward)    Start     Dose/Rate Route Frequency Ordered Stop   07/17/22 1245  ampicillin (OMNIPEN) 2 g in sodium chloride 0.9 % 100 mL IVPB        2 g 300 mL/hr over 20 Minutes Intravenous  Once 07/17/22 1149 07/17/22 1616        DVT prophylaxis: Eliquis on hold, SCDs  Code Status: DNR  Family Communication: No family at bedside   CONSULTS PCCM   Objective    Physical Examination:  General-appears in no acute distress Heart-S1-S2, regular, no murmur auscultated Lungs-clear to auscultation bilaterally, no wheezing or crackles auscultated Abdomen-soft, nontender, no organomegaly Extremities-no edema in the lower extremities Neuro-alert, oriented x3, no focal deficit noted    Status is: Inpatient:             [name]   [facility] If 7PM-7AM, please contact night-coverage at [email or web address], Office  [phone]   07/17/2022, 4:16 PM  LOS: 3 days

## 2022-07-17 NOTE — Procedures (Signed)
PleurX Insertion Procedure Note  Melody Casey  [id number]  [birth year]  Date:07/17/22  Time:3:05 PM   Provider Performing:Agostino Gorin Mamie Nick Carlis Abbott  Procedure: PleurX Tunneled Pleural Catheter Placement ([id number])  Indication(s) Relief of dyspnea from recurrent effusion  Consent Risks of the procedure as well as the alternatives and risks of each were explained to the patient and/or caregiver.  Consent for the procedure was obtained.   Anesthesia Topical only with 1% lidocaine    Time Out Verified patient identification, verified procedure, site/side was marked, verified correct patient position, special equipment/implants available, medications/allergies/relevant history reviewed, required imaging and test results available.   Sterile Technique Maximal sterile technique including sterile barrier drape, hand hygiene, sterile gown, sterile gloves, mask, hair covering.    Procedure Description Ultrasound used to identify appropriate pleural anatomy for placement and overlying skin marked. Placement difficult due to multiple areas of lung adhesion. Area of drainage cleaned and draped in sterile fashion.   Lidocaine was used to anesthetize the skin and subcutaneous tissue.   1.5 cm incision made overlying fluid and another about 5 cm anterior to this along chest wall.  PleurX catheter inserted in usual sterile fashion using modified seldinger technique.  Interrupted silk sutures placed at catheter insertion and tunneling points which will be removed at later date.  PleurX catheter then hooked to suction.  After 1L bloody fluid aspirated, pleurX capped and sterile dressing applied.    Complications/Tolerance None; patient tolerated the procedure well. Chest X-ray is ordered to confirm no post-procedural complication.   EBL Minimal   Specimen(s) none   Julian Hy, DO 07/17/22 3:06 PM [facility]

## 2022-07-17 NOTE — Progress Notes (Signed)
I attempted to call the patient's daughter again but could not reach her. I also reviewed the patient's chart as well. Dr. Lisbeth Renshaw would still consider palliative radiotherapy, but would limit the treatment to the hilum to alleviate the pressure on her airway due to risks with Sjogren's disease. She has decided to forgo any systemic therapy after meeting with Dr. Julien Nordmann. I also spoke with Vinie Sill, NP and she has met with the patient and her family and they are desiring pleurex placement, to discharge for a few days to decide how to proceed, and relocate to [facility] with plans to be seen at [facility] in [place] if she does not proceed with hospice. We will cancel simulation appointment for today.     Carola Rhine, PAC

## 2022-07-17 NOTE — Progress Notes (Signed)
   NAME:  Melody Casey, MRN:  [id number], DOB:  13-Jul-1946, LOS: 3 ADMISSION DATE:  07/14/2022, CONSULTATION DATE:  07/17/22 REFERRING MD: Dr. Darrick Meigs, TRH, CHIEF COMPLAINT: Dyspnea, shortness of breath  History of Present Illness:  76 year old woman admitted to ED with worsening dyspnea on exertion found to have enlarging right-sided pleural effusion.  She has a history of small cell lung cancer.  Recently diagnosed.  Had thoracentesis x2, 7/22 and 7/25.  1.5 L and 2.5 L the second time.  Cytology negative x2.  Underwent bronchoscopy with biopsy that showed small cell carcinoma.  Present with oncology.  I note reviewed.  Worsening shortness of breath started about 48 hours ago.  Presented to the ED late in the day yesterday.  Chest x-ray notable for slightly enlarged right-sided pleural effusion compared to 07/08/2022.  She was not hypoxemic.  Instructed to hold Eliquis for possible procedures.  She asked about Pleurx catheter.  Pertinent  Medical History  Small cell lung cancer  Significant Hospital Events: Including procedures, antibiotic start and stop dates in addition to other pertinent events   8/5 admitted with worsening shortness of breath not hypoxemic on presentation 8/7 No acute issues overnight, patient and family are interested in Pleurx and daughter confirms that she is willing and able to assist in management of catheter at home  Interim History / Subjective:  Still feels weak. She required a few doses of morphine overnight for dyspnea.   Objective   Blood pressure (!) 100/54, pulse 88, temperature 98.5 F (36.9 C), temperature source Oral, resp. rate 20, height 5\' 7"  (1.702 m), weight 73.8 kg, SpO2 93 %.        Intake/Output Summary (Last 24 hours) at 07/17/2022 1200 Last data filed at 07/16/2022 2200 Gross per 24 hour  Intake 100 ml  Output 200 ml  Net -100 ml    Filed Weights   07/14/22 1637 07/14/22 2027  Weight: 73.5 kg 73.8 kg    Examination: General: ill  appearing woman lying in bed in NAD HEENT: [place]/AT, eyes anicteric Neuro: sleepy but arouses to stimulation, asking appropriate questions and answering questions appropriately. Globally weak but moving all extremities. CV: well perfused PULM:  tachypnea, no accessory muscle use. Mostly speaking in full sentences, sometimes truncated by breathing.  GI: nondistended Skin: warm, dry, no diffuse rashes  Resolved Hospital Problem list   N/a  Assessment & Plan:  Large right malignant pleural effusion, a/w R lung small cell lung cancer -Underwent right thoracentesis 8/6 with 1L of serosanguinous fluid removed. 2 previous thoras in the previous weeks leading up to this. P: -Planning for pleurX today. She is still deciding if she wants aggressive care, but family is arragning follow up with Oncology at [facility] in [place] and she will be living with family. Family will manage her catheter. They have been in communication with [facility] about getting her bottles sent there once she moves.  -con't holding eliquis today -have reviewed risks, benefits, alternatives of pleurX with Melody Casey and her daughter, who will be assisting with management of the catheter.  Best Practice (right click and "Reselect all SmartList Selections" daily)  Per primary  Critical care time: NA    Julian Hy, DO 07/17/22 12:06 PM [facility]

## 2022-07-18 ENCOUNTER — Ambulatory Visit: Payer: Medicare Other | Admitting: Radiation Oncology

## 2022-07-18 DIAGNOSIS — Z7189 Other specified counseling: Secondary | ICD-10-CM | POA: Diagnosis not present

## 2022-07-18 DIAGNOSIS — C3432 Malignant neoplasm of lower lobe, left bronchus or lung: Secondary | ICD-10-CM | POA: Diagnosis not present

## 2022-07-18 DIAGNOSIS — Z515 Encounter for palliative care: Secondary | ICD-10-CM | POA: Diagnosis not present

## 2022-07-18 DIAGNOSIS — J90 Pleural effusion, not elsewhere classified: Secondary | ICD-10-CM | POA: Diagnosis not present

## 2022-07-18 LAB — BASIC METABOLIC PANEL
Anion gap: 7 (ref 5–15)
BUN: 48 mg/dL — ABNORMAL HIGH (ref 8–23)
CO2: 23 mmol/L (ref 22–32)
Calcium: 9.2 mg/dL (ref 8.9–10.3)
Chloride: 103 mmol/L (ref 98–111)
Creatinine, Ser: 1.11 mg/dL — ABNORMAL HIGH (ref 0.44–1.00)
GFR, Estimated: 51 mL/min — ABNORMAL LOW (ref >60)
Glucose, Bld: 148 mg/dL — ABNORMAL HIGH (ref 70–99)
Potassium: 4.7 mmol/L (ref 3.5–5.1)
Sodium: 133 mmol/L — ABNORMAL LOW (ref 135–145)

## 2022-07-18 LAB — CBC
HCT: 35.6 % — ABNORMAL LOW (ref 36.0–46.0)
Hemoglobin: 11.5 g/dL — ABNORMAL LOW (ref 12.0–15.0)
MCH: 28.5 pg (ref 26.0–34.0)
MCHC: 32.3 g/dL (ref 30.0–36.0)
MCV: 88.1 fL (ref 80.0–100.0)
Platelets: 289 10*3/uL (ref 150–400)
RBC: 4.04 MIL/uL (ref 3.87–5.11)
RDW: 15.1 % (ref 11.5–15.5)
WBC: 10.3 10*3/uL (ref 4.0–10.5)
nRBC: 0 % (ref 0.0–0.2)

## 2022-07-18 LAB — GLUCOSE, CAPILLARY
Glucose-Capillary: 133 mg/dL — ABNORMAL HIGH (ref 70–99)
Glucose-Capillary: 171 mg/dL — ABNORMAL HIGH (ref 70–99)
Glucose-Capillary: 169 mg/dL — ABNORMAL HIGH (ref 70–99)
Glucose-Capillary: 167 mg/dL — ABNORMAL HIGH (ref 70–99)

## 2022-07-18 LAB — CYTOLOGY - NON PAP

## 2022-07-18 MED ORDER — APIXABAN 5 MG PO TABS
5.0000 mg | ORAL_TABLET | Freq: Two times a day (BID) | ORAL | Status: DC
  Administered 2022-07-18 – 2022-07-20 (×4): 5 mg via ORAL
  Filled 2022-07-18 (×4): qty 1

## 2022-07-18 NOTE — Progress Notes (Signed)
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  Patient has recent diagnosis of non-small cell lung cancer diagnosed by bronchoscopy in July 2023.  She had thoracentesis on 07/03/2022 with 2.5 L of pleural fluid drained.  At that time there was no malignant cells identified, but felt to be malignant pleural effusion.     Chest x-ray obtained in the ED showed slight interval increase in the right pleural effusion with increased atelectasis/consolidation of right lung.  PCCM was consulted.   Patient underwent thoracentesis with removal of 1000 cc of serosanguineous appearing fluid from right pleural space. PleurX placed 8/8.   New events last 24 hours / Subjective: Patient anxious/disgruntled this morning.  She feels that her family has been pushing her procedures done.  Does not feel that she can discharge home.  States that she feels too weak to go home or travel to [place].    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)   Acute respiratory failure with hypoxia (HCC)    Acute hypoxemic respiratory failure secondary to right-sided pleural effusion -Currently on 2 L nasal cannula O2 this morning -Malignant pleural  effusion status post thoracocentesis and Pleurx catheter placement -Lasix  -She will need DME oxygen and PleurX supplies (form signed today) -Appreciate PCCM and assistance  Non-small cell lung cancer -Family interested in pursuing care in [place] where family resides  Persistent A-fib -Continue Tikosyn, metoprolol, resume Eliquis  Diabetes mellitus type 2 -Sliding scale insulin  Hypertension -Metoprolol  CAD status post percutaneous coronary angioplasty -Aspirin, Lipitor, Toprol  Anxiety -Xanax  Goals of care -Palliative care medicine following.  Discussed over the phone with Vinie Sill today     DVT prophylaxis:  SCDs Start: 07/14/22 2022 apixaban (ELIQUIS) tablet 5 mg  Code Status: DNR Family Communication: None at bedside  Disposition Plan:  Status is: Inpatient Remains inpatient appropriate because: Working toward discharge to home, likely tomorrow 8/10 if patient agreeable   Antimicrobials:  Anti-infectives (From admission, onward)    Start     Dose/Rate Route Frequency Ordered Stop   07/17/22 1245  ampicillin (OMNIPEN) 2 g in sodium chloride 0.9 % 100 mL IVPB        2 g 300 mL/hr over 20 Minutes Intravenous  Once 07/17/22 1149 07/17/22 1616        Objective: Vitals:   07/17/22 1757 07/18/22 0455 07/18/22 0903 07/18/22 1126  BP:  (!) 102/54 (!) 112/59 (!) 107/51  Pulse: 74 (!) 59 85 66  Resp:  18  18  Temp:  97.9 F (36.6 C)  97.7 F (36.5 C)  TempSrc:    Oral  SpO2:  100%  99%  Weight:      Height:  Intake/Output Summary (Last 24 hours) at 07/18/2022 1358 Last data filed at 07/18/2022 0559 Gross per 24 hour  Intake 563.85 ml  Output 75 ml  Net 488.85 ml   Filed Weights   07/14/22 1637 07/14/22 2027  Weight: 73.5 kg 73.8 kg    Examination:  General exam: Appears calm and comfortable  Respiratory system: Diminished breath sounds right.  Respiratory effort is normal.  On 2 L oxygen Cardiovascular system: S1 & S2 heard, RRR.  No murmurs. No pedal edema. Gastrointestinal system: Abdomen is nondistended, soft and nontender. Normal bowel sounds heard. Central nervous system: Alert and oriented. No focal neurological deficits. Speech clear.  Extremities: Symmetric in appearance  Skin: No rashes, lesions or ulcers on exposed skin  Psychiatry: Judgement and insight appear normal. Mood & affect appropriate.   Data Reviewed: I have personally reviewed following labs and imaging studies  CBC: Recent Labs  Lab 07/13/22 0833 07/14/22 1729 07/15/22 0421 07/16/22 0320 07/18/22 0357  WBC 9.7 10.6* 8.9 9.8 10.3  NEUTROABS 7.2 8.3*  --   --   --   HGB 12.5 12.4 11.6* 10.9* 11.5*  HCT 37.2 37.9 35.7* 33.5* 35.6*  MCV 83.6 86.1 87.5 87.2 88.1  PLT 358 363 310 293 944   Basic Metabolic Panel: Recent Labs  Lab 07/13/22 0833 07/14/22 1729 07/14/22 1935 07/15/22 0421 07/16/22 0320 07/18/22 0357  NA 129* 134*  --  134* 134* 133*  K 4.8 4.9  --  4.6 4.5 4.7  CL 101 104  --  105 106 103  CO2 22 23  --  23 23 23   GLUCOSE 163* 163*  --  126* 121* 148*  BUN 42* 38*  --  38* 38* 48*  CREATININE 1.00 1.05*  --  0.86 0.98 1.11*  CALCIUM 9.7 9.8  --  9.2 9.2 9.2  MG  --   --  1.9  --   --   --   PHOS  --   --  3.0  --   --   --    GFR: Estimated Creatinine Clearance: 40.6 mL/min (A) (by C-G formula based on SCr of 1.11 mg/dL (H)). Liver Function Tests: Recent Labs  Lab 07/13/22 0833 07/14/22 1729 07/15/22 0421 07/16/22 0320  AST 23 24 16  13*  ALT 27 27 22 18   ALKPHOS 92 90 78 66  BILITOT 0.4 0.6 0.7 0.6  PROT 6.5 6.3* 5.7* 5.3*  ALBUMIN 3.3* 2.8* 2.6* 2.4*   No results for input(s): "LIPASE", "AMYLASE" in the last 168 hours. No results for input(s): "AMMONIA" in the last 168 hours. Coagulation Profile: Recent Labs  Lab 07/14/22 1729  INR 1.2   Cardiac Enzymes: Recent Labs  Lab 07/14/22 1935  CKTOTAL 29*   BNP (last 3 results) No results for input(s): "PROBNP" in the last 8760 hours. HbA1C: No  results for input(s): "HGBA1C" in the last 72 hours. CBG: Recent Labs  Lab 07/17/22 1157 07/17/22 1633 07/17/22 2221 07/18/22 0736 07/18/22 1122  GLUCAP 131* 134* 200* 133* 171*   Lipid Profile: No results for input(s): "CHOL", "HDL", "LDLCALC", "TRIG", "CHOLHDL", "LDLDIRECT" in the last 72 hours. Thyroid Function Tests: No results for input(s): "TSH", "T4TOTAL", "FREET4", "T3FREE", "THYROIDAB" in the last 72 hours. Anemia Panel: No results for input(s): "VITAMINB12", "FOLATE", "FERRITIN", "TIBC", "IRON", "RETICCTPCT" in the last 72 hours. Sepsis Labs: No results for input(s): "PROCALCITON", "LATICACIDVEN" in the last 168 hours.  No results found for this or any previous visit (from the past 240 hour(s)).  Radiology Studies: DG CHEST PORT 1 VIEW  Result Date: 07/17/2022 CLINICAL DATA:  Placement of right chest tube EXAM: PORTABLE CHEST 1 VIEW COMPARISON:  Previous studies including the examination of 07/16/2022 FINDINGS: There is large right pleural effusion. There is interval placement of right chest tube with its tip in the lateral aspect of right lower lung field. Overall, there is no significant decrease in amount of right pleural effusion. Left lateral CP angle is clear. Left lung is clear of any infiltrates or pulmonary edema. Evaluation of right lung 40 infiltrates is limited by the effusion. IMPRESSION: Interval placement of right chest tube. Large right pleural effusion. There is no pneumothorax. Electronically Signed   By: Elmer Picker M.D.   On: 07/17/2022 15:40      Scheduled Meds:  apixaban  5 mg Oral BID   atorvastatin  60 mg Oral Daily   dofetilide  250 mcg Oral BID   feeding supplement  237 mL Oral TID BM   furosemide  40 mg Oral Daily   insulin aspart  0-9 Units Subcutaneous TID WC   magnesium oxide  400 mg Oral BID   metoprolol succinate  25 mg Oral Daily   multivitamin with minerals  1 tablet Oral Daily   sodium chloride flush  3 mL Intravenous Q12H    thiamine  100 mg Oral Daily   Continuous Infusions:  sodium chloride 10 mL/hr at 07/17/22 1629     LOS: 4 days     Dessa Phi, DO [facility] 07/18/2022, 1:58 PM   Available via Epic secure chat 7am-7pm After these hours, please refer to coverage provider listed on [email or web address]

## 2022-07-18 NOTE — TOC Transition Note (Signed)
Transition of Care [facility]) - CM/SW Discharge Note   Patient Details  Name: Melody Casey MRN: [id number] Date of Birth: 1946/01/25  Transition of Care [facility]) CM/SW Contact:  Roseanne Kaufman, RN Phone Number: 07/18/2022, 12:13 PM   Clinical Narrative: PleurX order faxed to [place], successful confirmation received.  MD notified [facility], PT/OT orders are needed.     TOC will continue to follow.  Final next level of care: Home w Home Health Services Barriers to Discharge: No Barriers Identified   Patient Goals and CMS Choice Patient states their goals for this hospitalization and ongoing recovery are:: home with family      Discharge Placement  Home with family             Discharge Plan and Services   Discharge Planning Services: CM Consult Post Acute Care Choice: Home Health              Date DME Agency Contacted: 07/16/22   Representative spoke with at DME Agency: Amy HH Arranged: RN [facility] Agency: [place] Date [place]: 07/16/22   Representative spoke with at [place]: Amy with [place]  Social Determinants of Health ([place]) Interventions     Readmission Risk Interventions     No data to display

## 2022-07-18 NOTE — Progress Notes (Signed)
OT Cancellation Note  Patient Details Name: Melody Casey MRN: [id number] DOB: 1946/01/09   Cancelled Treatment:    Reason Eval/Treat Not Completed: Fatigue/lethargy limiting ability to participate. Reports fatigue. Will f/u as able.   [name] L Hisayo Delossantos 07/18/2022, 11:06 AM

## 2022-07-18 NOTE — TOC Progression Note (Signed)
Transition of Care [facility]) - Progression Note    Patient Details  Name: Melody Casey MRN: [id number] Date of Birth: 12/05/1946  Transition of Care [facility]) CM/SW [place], RN Phone Number: 07/18/2022, 3:08 PM  Clinical Narrative:   RNCM left voicemail message for patient's daughter to offer home hospice. Awaiting call back.    Expected Discharge Plan: [place] Barriers to Discharge: No Barriers Identified  Expected Discharge Plan and Services Expected Discharge Plan: [place]   Discharge Planning Services: CM Consult Post Acute Care Choice: [place] arrangements for the past 2 months: Single Family Home                     Date DME Agency Contacted: 07/16/22   Representative spoke with at DME Agency: [facility] Arranged: RN [facility] Agency: [place] Date [place]: 07/16/22   Representative spoke with at [place]: Amy with [place]   Social Determinants of Health ([place]) Interventions    Readmission Risk Interventions     No data to display

## 2022-07-18 NOTE — Progress Notes (Signed)
PT Cancellation Note  Patient Details Name: Melody Casey MRN: [id number] DOB: 12-May-1946   Cancelled Treatment:    Reason Eval/Treat Not Completed: Fatigue/lethargy limiting ability to participate;Other (comment) (Attempted 2x in AM, pt fatigued and waiting for daughter to arrive and discuss plans. will follow up as schedule allows and pt able.)   Gwynneth Albright PT, DPT Acute Rehabilitation Services Office [phone] Pager [phone]  07/18/22 11:24 AM

## 2022-07-18 NOTE — Progress Notes (Addendum)
PCCM Progress Note   Met patients daughter at bedside to assist with Pleurx catheter education. Daughter was instructed on catheter care and guided through drainage process and dressing change. She preformed procedure well and all questions were answered. Instructed daughter on importance of clean technique. She understands infection signs to watch for.   Family is to drain catheter daily with 1L max with each drain. If volume of drainage drops below 56ms drainage can be cut back to every other day.  1L of bloody fluid was removed on today's drain   Eevee Borbon D. HKenton Kingfisher NP-C [facility] Personal contact information can be found on Amion  07/18/2022, 3:21 PM

## 2022-07-18 NOTE — Plan of Care (Signed)
  Problem: Coping: Goal: Ability to adjust to condition or change in health will improve Outcome: Progressing   Problem: Clinical Measurements: Goal: Respiratory complications will improve Outcome: Progressing

## 2022-07-18 NOTE — Progress Notes (Signed)
Palliative:  HPI: 76 y.o. female  with past medical history of atrial fibrillation, CAD, NSTEMI, AAA, HTN, HLD, breast cancer, diabetes, diverticulitis, osteoporosis, ureteral obstruction s/p stent admitted on 07/14/2022 with shortness of breath with recently diagnosed small cell lung cancer requiring frequent thoracentesis and now consideration of PleurX and trying to decide for radiation, chemotherapy, or even hospice.     I met today with Melody Casey and her daughter was present but stepped out for [place] to talk. I had an honest conversation with Melody Casey. She shares that she is tired. She does not want to live like this. She has some hopes that she can get some level of quality of life to spend time with her family. She tells me that she does not feel that she can make the trip to [place] but she knows this is her best option. She does not want to burden her family with her health issues. She also does not want her family to have to stay in [place] to take care of her. We discussed that she will need to have assistance and cannot manage on her own. She agrees that she would like to go home from the hospice and ultimately to [place] to be with her family and spend time with her children and grandchildren.   We discussed her wishes for aggressiveness of care. We discussed radiation and chemotherapy and I asked her if these are interventions that she is interested in. She tells me that she does not want these treatments. She shares about her husband's battle with cancer and how difficult radiation was on him. She does not want to add more suffering and pain and she does not want to prolong her life without quality. I discussed with her the potential of hospice care and she agrees this will be best for her because her main goal is that she does not want to suffer. She tells me that she is not afraid to die but does not want to suffer. She would like to have assistance from [place] and can transition to a local  hospice in [place] when she goes there to be with the rest of her family.   I updated Melody Casey who wants to respect her mother's wishes. She plans to update and discuss further with family. I updated Dr. Maylene Roes, Dr. Carlis Abbott, and TOC.   All questions/concerns addressed. Emotional support provided.   Exam: Alert, oriented. Generalized fatigue and weakness. Poor reserve. Tenderness to PleurX site. Breathing with increased effort at rest. No distress. Moves all extremities.   Plan: - DNR - Not interested in pursuing cancer treatment - Home with hospice desired  50 min  Vinie Sill, NP Palliative Medicine Team Pager [phone] (Please see [email or web address] for schedule) Team Phone [phone]    Greater than 50%  of this time was spent counseling and coordinating care related to the above assessment and plan

## 2022-07-18 NOTE — Progress Notes (Signed)
SATURATION QUALIFICATIONS: (This note is used to comply with regulatory documentation for home oxygen)  Patient Saturations on Room Air at Rest = 90%  Patient Saturations on Room Air while Ambulating = 84%  Patient Saturations on 4 Liters of oxygen while Ambulating = 92%  Please briefly explain why patient needs home oxygen: Patient has met the requirement for home oxygen. Pt requires 4L oxygen to maintain SpO2 above 90% while ambulating.

## 2022-07-18 NOTE — TOC Progression Note (Signed)
Transition of Care [facility]) - Progression Note    Patient Details  Name: Melody Casey MRN: [id number] Date of Birth: 09-05-1946  Transition of Care [facility]) CM/SW [place], RN Phone Number: 07/18/2022, 5:01 PM  Clinical Narrative:   [facility], PT/OT with [facility]. This RNCM initiated referral with [facility]. Per chart review patient needs home oxygen, need walking sats note to start oxygen set up with [employer]. Thus RNCM spoke with patient's daughter who has concerns that patient has not walked while here, [place] has spoke with floor RN to request PT eval.   TOC will continue to follow.    Expected Discharge Plan: Home w Hospice Care Barriers to Discharge: No Barriers Identified  Expected Discharge Plan and Services Expected Discharge Plan: [place]   Discharge Planning Services: CM Consult Post Acute Care Choice: [place] arrangements for the past 2 months: Single Family Home                     Date DME Agency Contacted: 07/16/22   Representative spoke with at DME Agency: [facility] Arranged: PT, OT, RN [place] Agency: [place] Date [place]: 07/16/22   Representative spoke with at [place]: Amy with [place]   Social Determinants of Health ([place]) Interventions    Readmission Risk Interventions     No data to display

## 2022-07-19 ENCOUNTER — Other Ambulatory Visit (HOSPITAL_COMMUNITY): Payer: Medicare Other

## 2022-07-19 ENCOUNTER — Ambulatory Visit: Payer: Medicare Other

## 2022-07-19 DIAGNOSIS — J90 Pleural effusion, not elsewhere classified: Secondary | ICD-10-CM | POA: Diagnosis not present

## 2022-07-19 LAB — BASIC METABOLIC PANEL
Anion gap: 6 (ref 5–15)
BUN: 49 mg/dL — ABNORMAL HIGH (ref 8–23)
CO2: 23 mmol/L (ref 22–32)
Calcium: 9.1 mg/dL (ref 8.9–10.3)
Chloride: 103 mmol/L (ref 98–111)
Creatinine, Ser: 1.24 mg/dL — ABNORMAL HIGH (ref 0.44–1.00)
GFR, Estimated: 45 mL/min — ABNORMAL LOW (ref >60)
Glucose, Bld: 153 mg/dL — ABNORMAL HIGH (ref 70–99)
Potassium: 5 mmol/L (ref 3.5–5.1)
Sodium: 132 mmol/L — ABNORMAL LOW (ref 135–145)

## 2022-07-19 LAB — GLUCOSE, CAPILLARY
Glucose-Capillary: 133 mg/dL — ABNORMAL HIGH (ref 70–99)
Glucose-Capillary: 173 mg/dL — ABNORMAL HIGH (ref 70–99)
Glucose-Capillary: 135 mg/dL — ABNORMAL HIGH (ref 70–99)
Glucose-Capillary: 130 mg/dL — ABNORMAL HIGH (ref 70–99)

## 2022-07-19 LAB — MAGNESIUM: Magnesium: 2.1 mg/dL (ref 1.7–2.4)

## 2022-07-19 MED ORDER — MORPHINE SULFATE (PF) 2 MG/ML IV SOLN
1.0000 mg | INTRAVENOUS | Status: DC | PRN
  Administered 2022-07-19: 1 mg via INTRAVENOUS
  Filled 2022-07-19: qty 1

## 2022-07-19 MED ORDER — MORPHINE SULFATE (CONCENTRATE) 10 MG/0.5ML PO SOLN
2.5000 mg | ORAL | Status: DC | PRN
  Administered 2022-07-20 (×2): 2.6 mg via SUBLINGUAL
  Filled 2022-07-19 (×2): qty 0.5

## 2022-07-19 NOTE — Progress Notes (Signed)
Physical Therapy Treatment Patient Details Name: Melody Casey MRN: [id number] DOB: June 04, 1946 Today's Date: 07/19/2022   History of Present Illness Patient is a 76 year old female who was admitted with increased shortness of breath.patient recently diagnosed with lung cancer.  patient PMH: a fib, HTN, scleroderma, CAD s/p PCI, diverticulitis/perforated sigmoid colon with colostomy, DM, breast cancer, AAA, L TKA,    PT Comments    Patient agreeable to participate in bedside activity but reports very fatigued and does not want to do aggressive therapy. Pt required min assist to sit up to EOB and rise to RW. Pt complete bed>BSC transfer and then to recliner after min assist/set up assist provided for pt to wash up. Encouraged time OOB and short bouts of ambulation in room with RN/NT staff as well as therapy. Will continue to progress/maintain mobility in acute setting as able.    Recommendations for follow up therapy are one component of a multi-disciplinary discharge planning process, led by the attending physician.  Recommendations may be updated based on patient status, additional functional criteria and insurance authorization.  Follow Up Recommendations  Home health PT     Assistance Recommended at Discharge PRN  Patient can return home with the following Help with stairs or ramp for entrance;A little help with walking and/or transfers;A little help with bathing/dressing/bathroom;Assistance with cooking/housework;Direct supervision/assist for medications management;Assist for transportation   Equipment Recommendations  None recommended by PT    Recommendations for Other Services       Precautions / Restrictions Precautions Precautions: Fall Precaution Comments: colostomy(2019), monitor O2 Restrictions Weight Bearing Restrictions: No     Mobility  Bed Mobility Overal bed mobility: Needs Assistance Bed Mobility: Supine to Sit     Supine to sit: HOB elevated, Min assist      General bed mobility comments: pt taking extra time and cues needed to use bed rail, assist to fully sit up EOB    Transfers Overall transfer level: Needs assistance Equipment used: Rolling walker (2 wheels) Transfers: Sit to/from Stand, Bed to chair/wheelchair/BSC Sit to Stand: Min assist, Min guard   Step pivot transfers: Min assist       General transfer comment: min assist to rise from EOB (slightly elevated) and cues for hand placement on RW. Pt completed stand step transfer bed>recliner>BSC>recliner during session.    Ambulation/Gait                   Stairs             Wheelchair Mobility    Modified Rankin (Stroke Patients Only)       Balance Overall balance assessment: Needs assistance Sitting-balance support: No upper extremity supported Sitting balance-Leahy Scale: Good Sitting balance - Comments: performed bathing and pericare   Standing balance support: No upper extremity supported Standing balance-Leahy Scale: Fair Standing balance comment: performed pericare in stand with single UE support                            Cognition Arousal/Alertness: Awake/alert Behavior During Therapy: WFL for tasks assessed/performed Overall Cognitive Status: Within Functional Limits for tasks assessed                                          Exercises      General Comments        Pertinent Vitals/Pain  Pain Assessment Pain Assessment: No/denies pain    Home Living                          Prior Function            PT Goals (current goals can now be found in the care plan section) Acute Rehab PT Goals Patient Stated Goal: figure out what to do about hospice vs chemotherapy PT Goal Formulation: With patient Time For Goal Achievement: 07/14/22 Potential to Achieve Goals: Good Progress towards PT goals: Progressing toward goals    Frequency    Min 3X/week      PT Plan Current plan remains  appropriate    Co-evaluation              AM-PAC PT "6 Clicks" Mobility   Outcome Measure  Help needed turning from your back to your side while in a flat bed without using bedrails?: None Help needed moving from lying on your back to sitting on the side of a flat bed without using bedrails?: A Little Help needed moving to and from a bed to a chair (including a wheelchair)?: A Little Help needed standing up from a chair using your arms (e.g., wheelchair or bedside chair)?: A Little Help needed to walk in hospital room?: A Little Help needed climbing 3-5 steps with a railing? : A Lot 6 Click Score: 18    End of Session Equipment Utilized During Treatment: Gait belt Activity Tolerance: Patient limited by fatigue Patient left: with call bell/phone within reach;in chair;with chair alarm set Nurse Communication: Mobility status PT Visit Diagnosis: Difficulty in walking, not elsewhere classified (R26.2)     Time: 4888-9169 PT Time Calculation (min) (ACUTE ONLY): 42 min  Charges:  $Therapeutic Activity: 38-52 mins                     Verner Mould, DPT Acute Rehabilitation Services Office [phone] Pager [phone]  07/19/22 12:48 PM

## 2022-07-19 NOTE — TOC Progression Note (Addendum)
Transition of Care [facility]) - Progression Note    Patient Details  Name: Melody Casey MRN: [id number] Date of Birth: 30-Mar-1946  Transition of Care [facility]) CM/SW [place], RN Phone Number: 07/19/2022, 8:56 AM  Clinical Narrative:   Hospice referral completed with [facility] has been notified of no longer needing services. Awaiting d/c orders.  RNCM spoke with patient's   TOC will continue to follow.   - 2:38p Spoke with patient's daughter who prefers to evaluate pt's ambulation prior to d/c, to determine her own transportation vs. PTAR. Patient's daughter has accepted home hospice with [facility] [place] who accepted the referral. [facility] will manage all DME/ [employer]. Informed pt's daughter that [place] will contact her directly, daughter and [facility] will manage together to arrange DME delivery prior to d/c. Informed daughter ensure she has a travel tank to transport patient home.Notified RN to complete pleurX drain today. Notified MD to speak with patient's daughter to discuss medical condition.      Continue to monitor for d/c plans.  Expected Discharge Plan: Home w Hospice Care Barriers to Discharge: No Barriers Identified  Expected Discharge Plan and Services Expected Discharge Plan: [place]   Discharge Planning Services: CM Consult Post Acute Care Choice: Hospice Living arrangements for the past 2 months: Single Family Home                   DME Agency: [facility] Date DME Agency Contacted: 07/16/22   Representative spoke with at DME Agency: [facility] Arranged: PT, OT, RN [place] Agency: [place] Date [place]: 07/16/22   Representative spoke with at [place]: Amy with [place]   Social Determinants of Health ([place]) Interventions    Readmission Risk Interventions     No data to display

## 2022-07-19 NOTE — Progress Notes (Signed)
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  Patient has recent diagnosis of non-small cell lung cancer diagnosed by bronchoscopy in July 2023.  She had thoracentesis on 07/03/2022 with 2.5 L of pleural fluid drained.  At that time there was no malignant cells identified, but felt to be malignant pleural effusion.     Chest x-ray obtained in the ED showed slight interval increase in the right pleural effusion with increased atelectasis/consolidation of right lung.  PCCM was consulted.   Patient underwent thoracentesis with removal of 1000 cc of serosanguineous appearing fluid from right pleural space. PleurX placed 8/8.  After meeting with palliative care medicine, patient wanted to go home with home hospice and eventually transition to move to [place] to be closer with family.  New events last 24 hours / Subjective: Patient without any physical complaints, seems to be depressed, discouraged and overwhelmed with everything going on in the hospital.  I called the daughter and daughter stated that patient was not ready to be discharged home yet as she has not even been walking (last seen by PT 8/7 and recommended for home health), and they do not have oxygen equipment delivered yet.  She had not yet spoken with hospice agency.  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)   Acute respiratory failure with hypoxia (HCC)    Acute hypoxemic respiratory failure secondary to right-sided pleural effusion -Currently on 2 L nasal cannula O2 this morning -Malignant pleural effusion status post thoracocentesis and Pleurx catheter placement -Lasix  -She will need DME oxygen and PleurX supplies  -Appreciate PCCM and assistance  Non-small cell lung cancer -Patient wants to go home with home hospice, referral was placed  Persistent A-fib -Continue Tikosyn, metoprolol, Eliquis  Diabetes mellitus type 2 -Sliding scale insulin  Hypertension -Metoprolol  CAD status post percutaneous coronary angioplasty -Aspirin, Lipitor, Toprol  Anxiety -Xanax  CKD stage IIIa -Stable      DVT prophylaxis:  SCDs Start: 07/14/22 2022 apixaban (ELIQUIS) tablet 5 mg  Code Status: DNR Family Communication: Daughter over the phone Disposition Plan:  Status is: Inpatient Remains inpatient appropriate because: Working toward discharge to home with hospice, likely tomorrow 8/11 if patient and daughter agreeable   Antimicrobials:  Anti-infectives (From admission, onward)    Start     Dose/Rate Route Frequency Ordered Stop   07/17/22 1245  ampicillin (OMNIPEN) 2 g in sodium chloride 0.9 % 100 mL IVPB        2 g 300 mL/hr over 20 Minutes Intravenous  Once 07/17/22 1149 07/17/22 1616        Objective: Vitals:   07/18/22 1126 07/18/22 2122 07/19/22 0510 07/19/22 0941  BP: (!) 107/51 121/63 (!) 100/53 (!) 97/56  Pulse:  66 (!) 53 75 64  Resp: 18 16 18    Temp: 97.7 F (36.5 C) 98.8 F (37.1 C) 98.4 F (36.9 C)   TempSrc: Oral Axillary Oral   SpO2: 99% 96% 98%   Weight:      Height:        Intake/Output Summary (Last 24 hours) at 07/19/2022 1337 Last data filed at 07/19/2022 0500 Gross per 24 hour   Intake --  Output 725 ml  Net -725 ml    Filed Weights   07/14/22 1637 07/14/22 2027  Weight: 73.5 kg 73.8 kg    Examination:  General exam: Appears calm  Respiratory system: Diminished breath sounds right.  Respiratory effort is normal.  On 2 L oxygen Cardiovascular system: S1 & S2 heard, RRR. No murmurs. No pedal edema. Gastrointestinal system: Abdomen is nondistended, soft and nontender. Normal bowel sounds heard. Central nervous system: Alert and oriented. No focal neurological deficits. Speech clear.  Extremities: Symmetric in appearance  Skin: No rashes, lesions or ulcers on exposed skin  Psychiatry: Judgement and insight appear normal. Mood & affect appropriate.   Data Reviewed: I have personally reviewed following labs and imaging studies  CBC: Recent Labs  Lab 07/13/22 0833 07/14/22 1729 07/15/22 0421 07/16/22 0320 07/18/22 0357  WBC 9.7 10.6* 8.9 9.8 10.3  NEUTROABS 7.2 8.3*  --   --   --   HGB 12.5 12.4 11.6* 10.9* 11.5*  HCT 37.2 37.9 35.7* 33.5* 35.6*  MCV 83.6 86.1 87.5 87.2 88.1  PLT 358 363 310 293 270    Basic Metabolic Panel: Recent Labs  Lab 07/14/22 1729 07/14/22 1935 07/15/22 0421 07/16/22 0320 07/18/22 0357 07/19/22 0815  NA 134*  --  134* 134* 133* 132*  K 4.9  --  4.6 4.5 4.7 5.0  CL 104  --  105 106 103 103  CO2 23  --  23 23 23 23   GLUCOSE 163*  --  126* 121* 148* 153*  BUN 38*  --  38* 38* 48* 49*  CREATININE 1.05*  --  0.86 0.98 1.11* 1.24*  CALCIUM 9.8  --  9.2 9.2 9.2 9.1  MG  --  1.9  --   --   --  2.1  PHOS  --  3.0  --   --   --   --     GFR: Estimated Creatinine Clearance: 36.4 mL/min (A) (by C-G formula based on SCr of 1.24 mg/dL (H)). Liver Function Tests: Recent Labs  Lab 07/13/22 0833 07/14/22 1729 07/15/22 0421 07/16/22 0320  AST 23 24 16  13*  ALT 27 27 22 18   ALKPHOS 92 90 78 66  BILITOT 0.4 0.6 0.7 0.6  PROT 6.5 6.3* 5.7* 5.3*  ALBUMIN 3.3* 2.8* 2.6* 2.4*    No results for input(s): "LIPASE",  "AMYLASE" in the last 168 hours. No results for input(s): "AMMONIA" in the last 168 hours. Coagulation Profile: Recent Labs  Lab 07/14/22 1729  INR 1.2    Cardiac Enzymes: Recent Labs  Lab 07/14/22 1935  CKTOTAL 29*    BNP (last 3 results) No results for input(s): "PROBNP" in the last 8760 hours. HbA1C: No results for input(s): "HGBA1C" in the last 72 hours. CBG: Recent Labs  Lab 07/18/22 1122 07/18/22 1644 07/18/22 2115 07/19/22 0740 07/19/22 1137  GLUCAP 171* 169* 167* 133* 173*    Lipid Profile: No results for input(s): "CHOL", "HDL", "LDLCALC", "TRIG", "CHOLHDL", "LDLDIRECT" in the last 72 hours. Thyroid Function Tests: No results for input(s): "TSH", "  T4TOTAL", "FREET4", "T3FREE", "THYROIDAB" in the last 72 hours. Anemia Panel: No results for input(s): "VITAMINB12", "FOLATE", "FERRITIN", "TIBC", "IRON", "RETICCTPCT" in the last 72 hours. Sepsis Labs: No results for input(s): "PROCALCITON", "LATICACIDVEN" in the last 168 hours.  No results found for this or any previous visit (from the past 240 hour(s)).    Radiology Studies: DG CHEST PORT 1 VIEW  Result Date: 07/17/2022 CLINICAL DATA:  Placement of right chest tube EXAM: PORTABLE CHEST 1 VIEW COMPARISON:  Previous studies including the examination of 07/16/2022 FINDINGS: There is large right pleural effusion. There is interval placement of right chest tube with its tip in the lateral aspect of right lower lung field. Overall, there is no significant decrease in amount of right pleural effusion. Left lateral CP angle is clear. Left lung is clear of any infiltrates or pulmonary edema. Evaluation of right lung 40 infiltrates is limited by the effusion. IMPRESSION: Interval placement of right chest tube. Large right pleural effusion. There is no pneumothorax. Electronically Signed   By: Elmer Picker M.D.   On: 07/17/2022 15:40      Scheduled Meds:  apixaban  5 mg Oral BID   atorvastatin  60 mg Oral Daily    dofetilide  250 mcg Oral BID   feeding supplement  237 mL Oral TID BM   furosemide  40 mg Oral Daily   insulin aspart  0-9 Units Subcutaneous TID WC   magnesium oxide  400 mg Oral BID   metoprolol succinate  25 mg Oral Daily   multivitamin with minerals  1 tablet Oral Daily   sodium chloride flush  3 mL Intravenous Q12H   thiamine  100 mg Oral Daily   Continuous Infusions:  sodium chloride 10 mL/hr at 07/17/22 1629     LOS: 5 days     Dessa Phi, DO [facility] 07/19/2022, 1:37 PM   Available via Epic secure chat 7am-7pm After these hours, please refer to coverage provider listed on [email or web address]

## 2022-07-19 NOTE — Progress Notes (Signed)
NAME:  Melody Casey, MRN:  [id number], DOB:  Jul 14, 1946, LOS: 5 ADMISSION DATE:  07/14/2022, CONSULTATION DATE:  07/19/22 REFERRING MD: Dr. Darrick Meigs, TRH, CHIEF COMPLAINT: Dyspnea, shortness of breath  History of Present Illness:  76 year old woman admitted to ED with worsening dyspnea on exertion found to have enlarging right-sided pleural effusion.  She has a history of small cell lung cancer.  Recently diagnosed in July 2023.  Had thoracentesis x2, 7/22 and 7/25.  1.5 L and 2.5 L the second time.  Cytology negative x2.  Underwent bronchoscopy with biopsy that showed small cell carcinoma.   Developed increased SOB 2 days prior to presentation on 8/5.  Initial CXR notable for slightly enlarged right sided pleural effusion (compared to 07/08/22).  Eliquis held. Patient underwent placement of PleurX catheter on 8/8.  Family teaching of drainage on 8/9.    Pertinent  Medical History  Small cell lung cancer  Significant Hospital Events: Including procedures, antibiotic start and stop dates in addition to other pertinent events   8/5 admitted with worsening shortness of breath not hypoxemic on presentation 8/7 No acute issues overnight, patient and family are interested in Pleurx and daughter confirms that she is willing and able to assist in management of catheter at home 8/8 Pt feels weak, required few doses morphine for dyspnea  Interim History / Subjective:  Patient states "I'm just waiting".  When asked to clarify, she states "waiting to die".  She reports she doesn't want to go to [place] any more.   Objective   Blood pressure (!) 113/59, pulse 85, temperature 98.1 F (36.7 C), temperature source Oral, resp. rate 16, height 5\' 7"  (1.702 m), weight 73.8 kg, SpO2 96 %.        Intake/Output Summary (Last 24 hours) at 07/19/2022 1439 Last data filed at 07/19/2022 0500 Gross per 24 hour  Intake --  Output 725 ml  Net -725 ml   Filed Weights   07/14/22 1637 07/14/22 2027  Weight: 73.5 kg 73.8 kg     Examination: General:  elderly female lying in bed in NAD HEENT: MM pink/moist, pupils equal, anicteric Neuro: AAOx4, speech clear, MAE PSY: flat affect CV: s1s2 RRR, no m/r/g PULM: tachypnea but no distress, diminished breath sounds on right before drainage > improved after  GI: soft, bsx4 active, colostomy Extremities: warm/dry, no edema  Skin: no rashes or lesions  Resolved Hospital Problem list      Assessment & Plan:   Large Right Pleural Effusion associated with R Small Cell Lung Cancer Underwent right thoracentesis 8/6 with 1L of serosanguinous fluid removed. 2 previous thoras in the previous weeks leading up to this. Cytology negative on 8/6 pleural fluid.   -drain PleurX 8/10  -will need PleurX supplies for discharge  -family has been educated on care & drainage of catheter  -ok to drain catheter daily with 1L maximum, if volume decreases to 523ml or below, can reduce drainage attempts to every other day -patient requesting no further interventions for her cancer, desires home with hospice.  She is unsure if she wants to attempt the trip to [place] anymore.  Requests her daughter be updated on pleural drainage.   Best Practice (right click and "Reselect all SmartList Selections" daily)  Per primary      Noe Gens, MSN, APRN, NP-C, AGACNP-BC [facility] 07/19/2022, 2:39 PM   Please see [email or web address] for pager details.   From 7A-7P if no response, please call [phone] After hours, please call  Warren Lacy [phone]

## 2022-07-19 NOTE — Plan of Care (Signed)
  Problem: Pain Managment: Goal: General experience of comfort will improve Outcome: Progressing   Problem: Clinical Measurements: Goal: Respiratory complications will improve Outcome: Not Progressing

## 2022-07-19 NOTE — Care Management Important Message (Signed)
Important Message  Patient Details Hospice referral completed with [facility] Name: Melody Casey MRN: [id number] Date of Birth: 12-Oct-1946   Medicare Important Message Given:  Yes     Kerin Salen 07/19/2022, 12:31 PM

## 2022-07-19 NOTE — Progress Notes (Signed)
Right pleurX catheter drained at bedside.  1.1L bloody drainage removed via drainge. Patient reported improvement in symptoms - less pressure in her chest after drainage. Support offered.    Patients daughter, [name], called for update - no answer, message left for return call.        Noe Gens, MSN, APRN, NP-C, AGACNP-BC [facility] 07/19/2022, 4:02 PM   Please see [email or web address] for pager details.   From 7A-7P if no response, please call [phone] After hours, please call [facility] [phone]

## 2022-07-20 ENCOUNTER — Ambulatory Visit: Payer: Medicare Other

## 2022-07-20 ENCOUNTER — Other Ambulatory Visit: Payer: Self-pay | Admitting: *Deleted

## 2022-07-20 ENCOUNTER — Telehealth: Payer: Self-pay | Admitting: Emergency Medicine

## 2022-07-20 DIAGNOSIS — J90 Pleural effusion, not elsewhere classified: Secondary | ICD-10-CM | POA: Diagnosis not present

## 2022-07-20 DIAGNOSIS — Z515 Encounter for palliative care: Secondary | ICD-10-CM

## 2022-07-20 LAB — GLUCOSE, CAPILLARY
Glucose-Capillary: 132 mg/dL — ABNORMAL HIGH (ref 70–99)
Glucose-Capillary: 153 mg/dL — ABNORMAL HIGH (ref 70–99)

## 2022-07-20 MED ORDER — MORPHINE SULFATE (CONCENTRATE) 20 MG/ML PO SOLN: 5.0000 mg | ORAL | 0 refills | Status: AC | PRN

## 2022-07-20 MED ORDER — ALPRAZOLAM 0.25 MG PO TABS: 0.2500 mg | ORAL_TABLET | Freq: Three times a day (TID) | ORAL | 0 refills | Status: AC | PRN

## 2022-07-20 MED ORDER — FUROSEMIDE 40 MG PO TABS: 40.0000 mg | ORAL_TABLET | Freq: Every day | ORAL | 0 refills | Status: AC

## 2022-07-20 MED ORDER — SENNOSIDES 8.6 MG PO TABS: 2.0000 | ORAL_TABLET | Freq: Two times a day (BID) | ORAL | 0 refills | Status: AC

## 2022-07-20 MED ORDER — POTASSIUM CHLORIDE CRYS ER 10 MEQ PO TBCR: 10.0000 meq | EXTENDED_RELEASE_TABLET | Freq: Every day | ORAL | 0 refills | Status: AC

## 2022-07-20 MED ORDER — PROCHLORPERAZINE MALEATE 10 MG PO TABS: 10.0000 mg | ORAL_TABLET | ORAL | 0 refills | Status: AC | PRN

## 2022-07-20 NOTE — Progress Notes (Signed)
The proposed treatment discussed in conference is for discussion purpose only and is not a binding recommendation.  The patients have not been physically examined, or presented with their treatment options.  Therefore, final treatment plans cannot be decided.  

## 2022-07-20 NOTE — Plan of Care (Signed)

## 2022-07-20 NOTE — Telephone Encounter (Signed)
Please let Tammy w Hospice know > I think because Whitney and I have only seen her in the inpatient setting we should ask her PCP Dr Dettinger to be the Hospice attending. If he is unable, then you may make me the attending of record.

## 2022-07-20 NOTE — Progress Notes (Signed)
Discharge instructions provided to patient's son, AVS provided to transport.  Patient picked up by Hospice-arranged transport.  PIV and cardiac monitoring removed.  Son confirmed that Pleurex supplies were picked up by patient's daughter, additional Pleurex bottles provided to son per his request.  Angie Fava, RN

## 2022-07-20 NOTE — Discharge Summary (Signed)
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  Patient has recent diagnosis of non-small cell lung cancer diagnosed by bronchoscopy in July 2023.  She had thoracentesis on 07/03/2022 with 2.5 L of pleural fluid drained.  At that time there was no malignant cells identified, but felt to be malignant pleural effusion.     Chest x-ray obtained in the ED showed slight interval increase in the right pleural effusion with increased atelectasis/consolidation of right lung.  PCCM was consulted.   Patient underwent thoracentesis with removal of 1000 cc of serosanguineous appearing fluid from right pleural space. PleurX placed 8/8.  After meeting with palliative care medicine, patient wanted to go home with home hospice and eventually transition to move to [place] to be closer with family.  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 Family is to drain catheter daily with 1L max with each drain. If volume of drainage drops below 591mls drainage can be cut back to every other day.  Non-small cell lung cancer -Patient wants to go home with home hospice, referral was placed  Persistent A-fib -Continue Tikosyn, metoprolol, Eliquis  Diabetes mellitus type 2 -Sliding scale insulin  Hypertension -Metoprolol  CAD status post percutaneous coronary angioplasty -Lipitor, Toprol  Anxiety -Xanax   CKD stage IIIa -Stable  Discharge Instructions  Discharge Instructions     Increase activity slowly   Complete by: As directed    No wound care   Complete by: As directed       Allergies as of 07/20/2022       Reactions   Miralax [polyethylene Glycol] Swelling, Rash   Took [facility] brand developed rash. Patient states she tolerated name brand MiraLax in the past. 09/01/15. Patient states she had diffuse swelling including swelling of her lips.    Vancomycin Rash, Shortness Of Breath   Acetaminophen Hives   Banana Other (See Comments)   Upset stomach   Oxycodone-acetaminophen Itching   Sulfa Antibiotics Rash   All over rash   Sulfacetamide Sodium Rash   All over rash   Sulfasalazine Rash   All over rash   Cefepime    rash   Latex Rash   Oxycodone-acetaminophen Rash   Sulfacetamide Sodium Rash  Sulfacetamide Sodium-sulfur Rash   Tape Rash        Medication List     STOP taking these medications    lisinopril 20 MG tablet Commonly known as: ZESTRIL       TAKE these medications    albuterol (2.5 MG/3ML) 0.083% nebulizer solution Commonly known as: PROVENTIL Take 3 mLs (2.5 mg  total) by nebulization every 4 (four) hours as needed for wheezing or shortness of breath.   ALPRAZolam 0.25 MG tablet Commonly known as: XANAX Take 1 tablet (0.25 mg total) by mouth 3 (three) times daily as needed for anxiety. What changed: additional instructions   atorvastatin 40 MG tablet Commonly known as: LIPITOR TAKE 1 AND 1/2 TABLETS BY MOUTH EVERY DAY   dofetilide 250 MCG capsule Commonly known as: TIKOSYN Take 1 capsule (250 mcg total) by mouth 2 (two) times daily.   Eliquis 5 MG Tabs tablet Generic drug: apixaban Take 1 tablet by mouth 2 times daily.   furosemide 40 MG tablet Commonly known as: LASIX Take 1 tablet (40 mg total) by mouth daily. Start taking on: July 21, 2022 What changed:  when to take this reasons to take this   magnesium oxide 400 MG tablet Commonly known as: MAG-OX Take 400 mg by mouth 2 (two) times daily.   metoprolol succinate 25 MG 24 hr tablet Commonly known as: TOPROL-XL Take one tablet daily   morphine 20 MG/ML concentrated solution Commonly known as: ROXANOL Take 0.25 mLs (5 mg total) by mouth every 4 (four) hours as needed for severe pain, shortness of breath or anxiety. May give sublingually if needed.   potassium chloride 10 MEQ tablet Commonly known as: KLOR-CON M Take 1 tablet (10 mEq total) by mouth daily. What changed:  when to take this reasons to take this   prochlorperazine 10 MG tablet Commonly known as: COMPAZINE Take 1 tablet (10 mg total) by mouth every 4 (four) hours as needed for nausea or vomiting. May crush, mix with water and give sublingually.   senna 8.6 MG tablet Commonly known as: Senokot Take 2 tablets (17.2 mg total) by mouth 2 (two) times daily. May crush, mix with water and give sublingually if needed.               Durable Medical Equipment  (From admission, onward)           Start     Ordered   07/18/22 1416  For home use only DME oxygen  Once       Question Answer Comment   Length of Need 6 Months   Mode or (Route) Nasal cannula   Liters per Minute 2   Frequency Continuous (stationary and portable oxygen unit needed)   Oxygen delivery system Gas      07/18/22 1415            Allergies  Allergen Reactions   Miralax [Polyethylene Glycol] Swelling and Rash    Took [facility] brand developed rash. Patient states she tolerated name brand MiraLax in the past.  09/01/15. Patient states she had diffuse swelling including swelling of her lips.    Vancomycin Rash and Shortness Of Breath   Acetaminophen Hives   Banana Other (See Comments)    Upset stomach   Oxycodone-Acetaminophen Itching   Sulfa Antibiotics Rash    All over rash   Sulfacetamide Sodium Rash    All over rash   Sulfasalazine Rash    All over rash    Cefepime  rash   Latex Rash   Oxycodone-Acetaminophen Rash   Sulfacetamide Sodium Rash   Sulfacetamide Sodium-Sulfur Rash   Tape Rash    Consultations: PCCM Radiation onc Palliative care    Procedures/Studies: DG CHEST PORT 1 VIEW  Result Date: 07/17/2022 CLINICAL DATA:  Placement of right chest tube EXAM: PORTABLE CHEST 1 VIEW COMPARISON:  Previous studies including the examination of 07/16/2022 FINDINGS: There is large right pleural effusion. There is interval placement of right chest tube with its tip in the lateral aspect of right lower lung field. Overall, there is no significant decrease in amount of right pleural effusion. Left lateral CP angle is clear. Left lung is clear of any infiltrates or pulmonary edema. Evaluation of right lung 40 infiltrates is limited by the effusion. IMPRESSION: Interval placement of right chest tube. Large right pleural effusion. There is no pneumothorax. Electronically Signed   By: Elmer Picker M.D.   On: 07/17/2022 15:40   DG CHEST PORT 1 VIEW  Result Date: 07/16/2022 CLINICAL DATA:  [id number] pleural effusion, history of breast cancer hypertension. EXAM: PORTABLE CHEST 1 VIEW COMPARISON:  July 14, 2022 FINDINGS: There is a large right pleural effusion seen and has further increased in the interim with small area of aeration in the right upper lung. Left lung remains clear. Atheromatous calcifications of the arch of the aorta. Cardiomediastinal silhouette appears stable. IMPRESSION: Very large right pleural effusion and has further increased in the interim. Electronically Signed   By: Frazier Richards M.D.   On: 07/16/2022 11:31   DG Chest 2 View  Result Date: 07/14/2022 CLINICAL DATA:  Shortness of breath, history of malignant effusion EXAM: CHEST - 2 VIEW COMPARISON:  07/08/2022 FINDINGS: Mild cardiomegaly. Slight interval increase in a large right pleural effusion with increased atelectasis or consolidation of the right lung. The left lung is normally aerated. Osseous structures unremarkable. IMPRESSION: Slight interval increase in a large right pleural effusion with increased atelectasis or consolidation of the right lung. The left lung is normally aerated. Electronically Signed   By: Delanna Ahmadi M.D.   On: 07/14/2022 17:03   DG CHEST PORT 1 VIEW  Result Date: 07/08/2022 CLINICAL DATA:  76 year old female presents for a value to a shin of RIGHT lower lobe mass. EXAM: PORTABLE CHEST 1 VIEW COMPARISON:  July 05, 2022 in July 03, 2022 FINDINGS: Trachea midline. Cardiomediastinal contours and hilar structures are stable though RIGHT heart border and hilum is obscured at again without change due to masslike area in the RIGHT lower chest better visualized on previous CT likely associated with small RIGHT-sided pleural effusion. No pneumothorax. LEFT chest is clear. On limited assessment no acute skeletal findings. IMPRESSION: 1. Masslike area in the RIGHT lower chest likely associated with small RIGHT-sided pleural effusion. Findings remain highly suspicious for neoplasm and or better assessed on recent chest CT. 2. No change in the appearance of the chest over the short interval. Electronically Signed    By: Zetta Bills M.D.   On: 07/08/2022 08:29   DG CHEST PORT 1 VIEW  Result Date: 07/05/2022 CLINICAL DATA:  Right pleural effusion EXAM: PORTABLE CHEST 1 VIEW COMPARISON:  Chest x-ray dated June 03, 2022 FINDINGS: Visualized cardiac and mediastinal contours are unchanged. Right hemithorax volume loss, similar to prior. Increased opacification of the right hemithorax, likely due to increased pleural effusion or atelectasis. No evidence of pneumothorax. IMPRESSION: Increased opacification of the right hemithorax, likely due to increased pleural effusion or atelectasis. Electronically Signed   By:  Yetta Glassman M.D.   On: 07/05/2022 08:14   MR BRAIN W WO CONTRAST  Result Date: 07/05/2022 CLINICAL DATA:  76 year old female with history of breast cancer, possible right lung mass. Staging. EXAM: MRI HEAD WITHOUT AND WITH CONTRAST TECHNIQUE: Multiplanar, multiecho pulse sequences of the brain and surrounding structures were obtained without and with intravenous contrast. CONTRAST:  66mL GADAVIST GADOBUTROL 1 MMOL/ML IV SOLN COMPARISON:  Head CT 08/13/2015. FINDINGS: Brain: Mild motion artifact, including on postcontrast images. No restricted diffusion to suggest acute infarction. No midline shift, mass effect, evidence of mass lesion, ventriculomegaly, extra-axial collection or acute intracranial hemorrhage. Cervicomedullary junction and pituitary are within normal limits. No abnormal enhancement identified.  No dural thickening Some cerebral volume loss since 2016. Patchy, widely scattered bilateral cerebral white matter T2 and FLAIR hyperintensity. Similar signal heterogeneity in the pons. And a pontine chronic microhemorrhage is evident on series 9, image 15. No other chronic cerebral blood products identified. No definite cortical encephalomalacia. Deep gray matter nuclei and cerebellum appear spared. Vascular: Major intracranial vascular flow voids are preserved, the distal right vertebral artery appears  dominant. The major dural venous sinuses are enhancing and appear to be patent. Skull and upper cervical spine: Prominent arachnoid granulation in the midline occipital bone (normal variant series 8, image 14 and series 7, image 12. No destructive bone lesion identified. Negative for age visible cervical spine. Sinuses/Orbits: Orbits appear negative aside from postoperative changes to both globes. Paranasal sinuses and mastoids are clear. Other: Visible internal auditory structures appear normal. Negative visible scalp and face. IMPRESSION: 1. Mildly motion degraded exam. No metastatic disease or acute intracranial abnormality identified. 2. Moderate for age signal changes in the cerebral white matter and pons, compatible with chronic small vessel disease. Electronically Signed   By: Genevie Ann M.D.   On: 07/05/2022 07:29   US THORACENTESIS ASP PLEURAL SPACE W/IMG GUIDE  Result Date: 07/04/2022 INDICATION: Pleural effusion, history of breast cancer, shortness of breath. Request for diagnostic and therapeutic right thoracentesis. EXAM: ULTRASOUND GUIDED DIAGNOSTIC AND THERAPEUTIC RIGHT THORACENTESIS MEDICATIONS: 10 mL 1 % lidocaine COMPLICATIONS: None immediate. PROCEDURE: An ultrasound guided thoracentesis was thoroughly discussed with the patient and questions answered. The benefits, risks, alternatives and complications were also discussed. The patient understands and wishes to proceed with the procedure. Written consent was obtained. Ultrasound was performed to localize and mark an adequate pocket of fluid in the right chest. The area was then prepped and draped in the normal sterile fashion. 1% Lidocaine was used for local anesthesia. Under ultrasound guidance a 6 Fr Safe-T-Centesis catheter was introduced. Thoracentesis was performed. The catheter was removed and a dressing applied. FINDINGS: A total of approximately 2.5 L of serosanguineous fluid was removed. Samples were sent to the laboratory as requested  by the clinical team. IMPRESSION: Successful ultrasound guided right thoracentesis yielding 2.5 L of pleural fluid. Read by: Narda Rutherford, AGNP-BC Electronically Signed   By: Miachel Roux M.D.   On: 07/04/2022 07:35   CT CHEST WO CONTRAST  Result Date: 07/03/2022 CLINICAL DATA:  Pleural effusion.  History of breast cancer. EXAM: CT CHEST WITHOUT CONTRAST TECHNIQUE: Multidetector CT imaging of the chest was performed following the standard protocol without IV contrast. RADIATION DOSE REDUCTION: This exam was performed according to the departmental dose-optimization program which includes automated exposure control, adjustment of the mA and/or kV according to patient size and/or use of iterative reconstruction technique. COMPARISON:  None Available. FINDINGS: Cardiovascular: Heart is normal in size. Small volume pericardial  effusion. No evidence of thoracic aortic aneurysm. Atherosclerotic calcifications of the aortic arch. Coronary atherosclerosis of the LAD and left circumflex. Mediastinum/Nodes: Bulky mediastinal lymphadenopathy, including a 2.8 cm short axis right paratracheal node (series 2/image 85) and a 4.5 cm subcarinal nodal mass. Tumor likely extends to the right hilar region, although this is poorly evaluated on unenhanced CT. Visualized thyroid is unremarkable. Lungs/Pleura: Endobronchial soft tissue occluding the right lower lobe bronchus (series 2/image 33). Associated masslike right lower lobe opacity, likely reflecting a combination of central tumor and postobstructive atelectasis/collapse, although ill-defined on unenhanced CT. A central tumor component of 5.6 cm is suspected (series 2/image 87). Complete right middle lobe atelectasis/collapse. Trace residual right pleural effusion. Associated pleural-based nodularity (series 2/images 50, 72, and 75), reflecting pleural metastases. 6 mm right upper lobe nodule (series 5/image 69), indeterminate. Left lung is clear. Mild centrilobular emphysematous  changes. No pneumothorax. Upper Abdomen: Visualized upper abdomen is notable for 12 mm right adrenal nodule (series 2/image 150), new from 2020 CT abdomen/pelvis, suspicious for metastasis. Vascular calcifications. Musculoskeletal: Mild degenerative changes of the lower thoracic spine. IMPRESSION: Suspected large right lower lobe mass, poorly evaluated on unenhanced CT, with associated right middle and lower lobe atelectasis/collapse. This appearance is suspicious for primary bronchogenic neoplasm. Bulky mediastinal nodal metastases. Residual small right pleural effusion with pleural-based metastases. Suspected small right adrenal metastasis. Aortic Atherosclerosis (ICD10-I70.0) and Emphysema (ICD10-J43.9). Electronically Signed   By: Julian Hy M.D.   On: 07/03/2022 19:34   DG Chest Port 1 View  Result Date: 07/03/2022 CLINICAL DATA:  Post right thoracentesis. EXAM: PORTABLE CHEST 1 VIEW COMPARISON:  07/02/2022 FINDINGS: Examination demonstrates interval improvement in patient's right-sided pleural effusion with residual small to moderate effusion present. No pneumothorax. Left lung unchanged. Cardiomediastinal silhouette and remainder of the exam is unchanged. IMPRESSION: Significant interval improvement in patient's right pleural effusion post thoracentesis. No pneumothorax. Electronically Signed   By: Marin Olp M.D.   On: 07/03/2022 16:37   DG CHEST PORT 1 VIEW  Result Date: 07/02/2022 CLINICAL DATA:  Pleural effusion EXAM: PORTABLE CHEST 1 VIEW COMPARISON:  06/30/2022 FINDINGS: Stable heart size. Aortic atherosclerosis. Moderate-large right pleural effusion with associated right basilar opacity, similar to prior. Left lung appears clear. No pneumothorax. IMPRESSION: Moderate-large right pleural effusion with associated right basilar opacity, similar to prior. Electronically Signed   By: Davina Poke D.O.   On: 07/02/2022 14:12   US THORACENTESIS ASP PLEURAL SPACE W/IMG GUIDE  Result  Date: 06/30/2022 INDICATION: Pleural effusion, history of breast cancer, shortness of breath EXAM: ULTRASOUND GUIDED RIGHT THORACENTESIS MEDICATIONS: None. COMPLICATIONS: None immediate. PROCEDURE: An ultrasound guided thoracentesis was thoroughly discussed with the patient and questions answered. The benefits, risks, alternatives and complications were also discussed. The patient understands and wishes to proceed with the procedure. Written consent was obtained. Ultrasound was performed to localize and mark an adequate pocket of fluid in the right chest. The area was then prepped and draped in the normal sterile fashion. 1% Lidocaine was used for local anesthesia. Under ultrasound guidance a 6 Fr Safe-T-Centesis catheter was introduced. Thoracentesis was performed. The catheter was removed and a dressing applied. FINDINGS: A total of approximately 1.3L of red pleural fluid was removed. Samples were sent to the laboratory as requested by the clinical team. IMPRESSION: Successful ultrasound guided right thoracentesis yielding 1.3L of pleural fluid. Performed and dictated by Pasty Spillers, PA-C Electronically Signed   By: Markus Daft M.D.   On: 06/30/2022 21:31   DG CHEST PORT 1  VIEW  Result Date: 06/30/2022 CLINICAL DATA:  Post right-sided thoracentesis. EXAM: PORTABLE CHEST 1 VIEW COMPARISON:  06/29/2022 FINDINGS: Examination demonstrates a moderate to large right pleural effusion with mild interval improvement post thoracentesis. No right-sided pneumothorax. Left lung is clear. Mild stable cardiomegaly. Remainder of the exam is unchanged. IMPRESSION: 1. Moderate to large right pleural effusion with mild interval improvement post thoracentesis. No pneumothorax. 2. Mild stable cardiomegaly. Electronically Signed   By: Marin Olp M.D.   On: 06/30/2022 13:15   DG Chest 2 View  Result Date: 06/29/2022 CLINICAL DATA:  Dyspnea, pleural effusion EXAM: CHEST - 2 VIEW COMPARISON:  Radiographs 06/25/2022 FINDINGS:  Since 06/25/2022, slightly increased moderate-large right pleural effusion and associated atelectasis. Remainder unchanged. Left basilar atelectasis. Chronic interstitial coarsening. Aortic calcification. Unchanged cardiomediastinal silhouette. No acute osseous abnormality. IMPRESSION: Increased moderate-large right pleural effusion. Electronically Signed   By: Placido Sou M.D.   On: 06/29/2022 15:36   DG Chest 2 View  Result Date: 06/25/2022 CLINICAL DATA:  Shortness of breath.  Atrial fibrillation. EXAM: CHEST - 2 VIEW COMPARISON:  05/02/2022 FINDINGS: Moderate to large right pleural effusion occupies the lower 1/2 of right hemithorax. This is new from prior exam. Suspect associated atelectasis with basilar volume loss. Grossly stable heart size and mediastinal contours, aortic atherosclerosis. Chronic interstitial coarsening. No pneumothorax. No acute osseous findings. IMPRESSION: Moderate to large right pleural effusion, new from prior exam. Electronically Signed   By: Keith Rake M.D.   On: 06/25/2022 19:22       Discharge Exam: Vitals:   07/20/22 0548 07/20/22 1042  BP: (!) 99/58 (!) 110/52  Pulse: 73   Resp:    Temp: 98.3 F (36.8 C)   SpO2: 96%     General: Pt is alert, awake, appears weak and deconditioned Cardiovascular: RRR, S1/S2 +, no edema Respiratory: CTA bilaterally, no wheezing, no rhonchi, no respiratory distress, no conversational dyspnea  Abdominal: Soft, NT, ND, bowel sounds + Extremities: no edema, no cyanosis Psych: Normal mood and affect   The results of significant diagnostics from this hospitalization (including imaging, microbiology, ancillary and laboratory) are listed below for reference.     Microbiology: No results found for this or any previous visit (from the past 240 hour(s)).   Labs: BNP (last 3 results) Recent Labs    06/29/22 1522 07/04/22 0810  BNP 104.0* 08.6   Basic Metabolic Panel: Recent Labs  Lab 07/14/22 1729  07/14/22 1935 07/15/22 0421 07/16/22 0320 07/18/22 0357 07/19/22 0815  NA 134*  --  134* 134* 133* 132*  K 4.9  --  4.6 4.5 4.7 5.0  CL 104  --  105 106 103 103  CO2 23  --  23 23 23 23   GLUCOSE 163*  --  126* 121* 148* 153*  BUN 38*  --  38* 38* 48* 49*  CREATININE 1.05*  --  0.86 0.98 1.11* 1.24*  CALCIUM 9.8  --  9.2 9.2 9.2 9.1  MG  --  1.9  --   --   --  2.1  PHOS  --  3.0  --   --   --   --    Liver Function Tests: Recent Labs  Lab 07/14/22 1729 07/15/22 0421 07/16/22 0320  AST 24 16 13*  ALT 27 22 18   ALKPHOS 90 78 66  BILITOT 0.6 0.7 0.6  PROT 6.3* 5.7* 5.3*  ALBUMIN 2.8* 2.6* 2.4*   No results for input(s): "LIPASE", "AMYLASE" in the last 168 hours.  No results for input(s): "AMMONIA" in the last 168 hours. CBC: Recent Labs  Lab 07/14/22 1729 07/15/22 0421 07/16/22 0320 07/18/22 0357  WBC 10.6* 8.9 9.8 10.3  NEUTROABS 8.3*  --   --   --   HGB 12.4 11.6* 10.9* 11.5*  HCT 37.9 35.7* 33.5* 35.6*  MCV 86.1 87.5 87.2 88.1  PLT 363 310 293 289   Cardiac Enzymes: Recent Labs  Lab 07/14/22 1935  CKTOTAL 29*   BNP: Invalid input(s): "POCBNP" CBG: Recent Labs  Lab 07/19/22 0740 07/19/22 1137 07/19/22 1622 07/19/22 2230 07/20/22 0729  GLUCAP 133* 173* 135* 130* 132*   D-Dimer No results for input(s): "DDIMER" in the last 72 hours. Hgb A1c No results for input(s): "HGBA1C" in the last 72 hours. Lipid Profile No results for input(s): "CHOL", "HDL", "LDLCALC", "TRIG", "CHOLHDL", "LDLDIRECT" in the last 72 hours. Thyroid function studies No results for input(s): "TSH", "T4TOTAL", "T3FREE", "THYROIDAB" in the last 72 hours.  Invalid input(s): "FREET3" Anemia work up No results for input(s): "VITAMINB12", "FOLATE", "FERRITIN", "TIBC", "IRON", "RETICCTPCT" in the last 72 hours. Urinalysis    Component Value Date/Time   COLORURINE YELLOW 09/01/2015 1800   APPEARANCEUR Cloudy (A) 01/05/2020 1114   LABSPEC 1.025 09/01/2015 1800   PHURINE 6.0  09/01/2015 1800   GLUCOSEU Negative 01/05/2020 1114   GLUCOSEU NEGATIVE 04/05/2008 0833   HGBUR SMALL (A) 09/01/2015 1800   BILIRUBINUR Negative 01/05/2020 1114   KETONESUR NEGATIVE 09/01/2015 1800   PROTEINUR Negative 01/05/2020 1114   PROTEINUR TRACE (A) 09/01/2015 1800   UROBILINOGEN negative 10/25/2015 0926   UROBILINOGEN 0.2 09/01/2015 1800   NITRITE Positive (A) 01/05/2020 1114   NITRITE NEGATIVE 09/01/2015 1800   LEUKOCYTESUR 3+ (A) 01/05/2020 1114   Sepsis Labs Recent Labs  Lab 07/14/22 1729 07/15/22 0421 07/16/22 0320 07/18/22 0357  WBC 10.6* 8.9 9.8 10.3   Microbiology No results found for this or any previous visit (from the past 240 hour(s)).   Patient was seen and examined on the day of discharge and was found to be in stable condition. Time coordinating discharge: 35 minutes including assessment and coordination of care, as well as examination of the patient.   SIGNED:  Dessa Phi, DO [facility] 07/20/2022, 11:03 AM

## 2022-07-20 NOTE — TOC Progression Note (Addendum)
Transition of Care [facility]) - Progression Note    Patient Details  Name: Melody Casey MRN: [id number] Date of Birth: [birth year]  Transition of Care [facility]) CM/SW Contact  Leeroy Cha, RN Phone Number: 07/20/2022, 10:53 AM  Clinical Narrative: [name] -[facility]-patient going home today via [facility].  Cassandra to set up transport for 1 pm today. Dr Maylene Roes and floor rn notified. Transport packet completed and given to the unit clerk.  Expected Discharge Plan: Home w Hospice Care Barriers to Discharge: No Barriers Identified  Expected Discharge Plan and Services Expected Discharge Plan: [place]   Discharge Planning Services: CM Consult Post Acute Care Choice: Hospice Living arrangements for the past 2 months: Single Family Home                   DME Agency: [facility] Date DME Agency Contacted: 07/16/22   Representative spoke with at DME Agency: Amy HH Arranged: PT, OT, RN [place] Agency: [place] Date [place]: 07/16/22   Representative spoke with at [place]: Amy with [place]   Social Determinants of Health ([place]) Interventions    Readmission Risk Interventions     No data to display

## 2022-07-20 NOTE — Telephone Encounter (Signed)
Called and spoke with Tammy with Hospice and notified of response  She verbalized understanding  Nothing further needed

## 2022-07-20 NOTE — Telephone Encounter (Signed)
I have reached out to hospice and they want to see if either of you are willing to be an attending provider. Please advise.

## 2022-07-20 NOTE — Progress Notes (Signed)
Spoke with Cassandra from [facility] [phone]), who asked about discharge plan for patient.  MD and case manager notified.  RN gave Cassandra TOC's contact information.  Angie Fava

## 2022-07-23 ENCOUNTER — Ambulatory Visit: Payer: Medicare Other

## 2022-07-23 ENCOUNTER — Other Ambulatory Visit (HOSPITAL_COMMUNITY): Payer: Self-pay

## 2022-07-24 ENCOUNTER — Ambulatory Visit: Payer: Medicare Other

## 2022-07-24 ENCOUNTER — Telehealth: Payer: Self-pay

## 2022-07-24 DIAGNOSIS — C3432 Malignant neoplasm of lower lobe, left bronchus or lung: Secondary | ICD-10-CM | POA: Diagnosis not present

## 2022-07-24 DIAGNOSIS — J90 Pleural effusion, not elsewhere classified: Secondary | ICD-10-CM | POA: Diagnosis not present

## 2022-07-24 DIAGNOSIS — R0600 Dyspnea, unspecified: Secondary | ICD-10-CM | POA: Diagnosis not present

## 2022-07-24 DIAGNOSIS — R530 Neoplastic (malignant) related fatigue: Secondary | ICD-10-CM | POA: Diagnosis not present

## 2022-07-24 DIAGNOSIS — R590 Localized enlarged lymph nodes: Secondary | ICD-10-CM | POA: Diagnosis not present

## 2022-07-24 DIAGNOSIS — Z9981 Dependence on supplemental oxygen: Secondary | ICD-10-CM | POA: Diagnosis not present

## 2022-07-24 NOTE — Telephone Encounter (Signed)
This RNCM received inbound call from hospice, returning daughter's call this RNCM advised patient has been discharged from [facility] will follow up with daughter.

## 2022-07-25 ENCOUNTER — Ambulatory Visit: Payer: Medicare Other

## 2022-07-25 DIAGNOSIS — C3432 Malignant neoplasm of lower lobe, left bronchus or lung: Secondary | ICD-10-CM | POA: Diagnosis not present

## 2022-07-25 DIAGNOSIS — Z9981 Dependence on supplemental oxygen: Secondary | ICD-10-CM | POA: Diagnosis not present

## 2022-07-25 DIAGNOSIS — R530 Neoplastic (malignant) related fatigue: Secondary | ICD-10-CM | POA: Diagnosis not present

## 2022-07-25 DIAGNOSIS — R0600 Dyspnea, unspecified: Secondary | ICD-10-CM | POA: Diagnosis not present

## 2022-07-25 DIAGNOSIS — J90 Pleural effusion, not elsewhere classified: Secondary | ICD-10-CM | POA: Diagnosis not present

## 2022-07-25 DIAGNOSIS — R590 Localized enlarged lymph nodes: Secondary | ICD-10-CM | POA: Diagnosis not present

## 2022-07-26 ENCOUNTER — Ambulatory Visit: Payer: Medicare Other

## 2022-07-26 DIAGNOSIS — R590 Localized enlarged lymph nodes: Secondary | ICD-10-CM | POA: Diagnosis not present

## 2022-07-26 DIAGNOSIS — Z9981 Dependence on supplemental oxygen: Secondary | ICD-10-CM | POA: Diagnosis not present

## 2022-07-26 DIAGNOSIS — C3432 Malignant neoplasm of lower lobe, left bronchus or lung: Secondary | ICD-10-CM | POA: Diagnosis not present

## 2022-07-26 DIAGNOSIS — J90 Pleural effusion, not elsewhere classified: Secondary | ICD-10-CM | POA: Diagnosis not present

## 2022-07-26 DIAGNOSIS — R0600 Dyspnea, unspecified: Secondary | ICD-10-CM | POA: Diagnosis not present

## 2022-07-26 DIAGNOSIS — R530 Neoplastic (malignant) related fatigue: Secondary | ICD-10-CM | POA: Diagnosis not present

## 2022-07-27 ENCOUNTER — Ambulatory Visit: Payer: Medicare Other

## 2022-07-27 DIAGNOSIS — R590 Localized enlarged lymph nodes: Secondary | ICD-10-CM | POA: Diagnosis not present

## 2022-07-27 DIAGNOSIS — Z9981 Dependence on supplemental oxygen: Secondary | ICD-10-CM | POA: Diagnosis not present

## 2022-07-27 DIAGNOSIS — J90 Pleural effusion, not elsewhere classified: Secondary | ICD-10-CM | POA: Diagnosis not present

## 2022-07-27 DIAGNOSIS — R530 Neoplastic (malignant) related fatigue: Secondary | ICD-10-CM | POA: Diagnosis not present

## 2022-07-27 DIAGNOSIS — C3432 Malignant neoplasm of lower lobe, left bronchus or lung: Secondary | ICD-10-CM | POA: Diagnosis not present

## 2022-07-27 DIAGNOSIS — R0600 Dyspnea, unspecified: Secondary | ICD-10-CM | POA: Diagnosis not present

## 2022-07-30 ENCOUNTER — Ambulatory Visit: Payer: Medicare Other | Admitting: Psychologist

## 2022-07-30 ENCOUNTER — Ambulatory Visit: Payer: Medicare Other

## 2022-07-30 DIAGNOSIS — C3432 Malignant neoplasm of lower lobe, left bronchus or lung: Secondary | ICD-10-CM | POA: Diagnosis not present

## 2022-07-30 DIAGNOSIS — R0600 Dyspnea, unspecified: Secondary | ICD-10-CM | POA: Diagnosis not present

## 2022-07-30 DIAGNOSIS — Z9981 Dependence on supplemental oxygen: Secondary | ICD-10-CM | POA: Diagnosis not present

## 2022-07-30 DIAGNOSIS — R590 Localized enlarged lymph nodes: Secondary | ICD-10-CM | POA: Diagnosis not present

## 2022-07-30 DIAGNOSIS — R530 Neoplastic (malignant) related fatigue: Secondary | ICD-10-CM | POA: Diagnosis not present

## 2022-07-30 DIAGNOSIS — J90 Pleural effusion, not elsewhere classified: Secondary | ICD-10-CM | POA: Diagnosis not present

## 2022-07-31 ENCOUNTER — Ambulatory Visit: Payer: Medicare Other

## 2022-07-31 DIAGNOSIS — R530 Neoplastic (malignant) related fatigue: Secondary | ICD-10-CM | POA: Diagnosis not present

## 2022-07-31 DIAGNOSIS — C3432 Malignant neoplasm of lower lobe, left bronchus or lung: Secondary | ICD-10-CM | POA: Diagnosis not present

## 2022-07-31 DIAGNOSIS — R0600 Dyspnea, unspecified: Secondary | ICD-10-CM | POA: Diagnosis not present

## 2022-07-31 DIAGNOSIS — R590 Localized enlarged lymph nodes: Secondary | ICD-10-CM | POA: Diagnosis not present

## 2022-07-31 DIAGNOSIS — Z9981 Dependence on supplemental oxygen: Secondary | ICD-10-CM | POA: Diagnosis not present

## 2022-07-31 DIAGNOSIS — J90 Pleural effusion, not elsewhere classified: Secondary | ICD-10-CM | POA: Diagnosis not present

## 2022-08-10 DEATH — deceased
